# Patient Record
Sex: Female | Born: 1975 | Race: Black or African American | Hispanic: No | Marital: Single | Smoking: Former smoker
Health system: Southern US, Community
[De-identification: ages and names within clinical notes are randomized; demographics above are authoritative.]

## PROBLEM LIST (undated history)

## (undated) DIAGNOSIS — M4628 Osteomyelitis of vertebra, sacral and sacrococcygeal region: Secondary | ICD-10-CM

## (undated) DIAGNOSIS — G8220 Paraplegia, unspecified: Secondary | ICD-10-CM

## (undated) DIAGNOSIS — N390 Urinary tract infection, site not specified: Secondary | ICD-10-CM

## (undated) DIAGNOSIS — J189 Pneumonia, unspecified organism: Secondary | ICD-10-CM

## (undated) DIAGNOSIS — S31809A Unspecified open wound of unspecified buttock, initial encounter: Secondary | ICD-10-CM

## (undated) DIAGNOSIS — G35 Multiple sclerosis: Secondary | ICD-10-CM

## (undated) DIAGNOSIS — I499 Cardiac arrhythmia, unspecified: Secondary | ICD-10-CM

## (undated) DIAGNOSIS — D709 Neutropenia, unspecified: Secondary | ICD-10-CM

## (undated) DIAGNOSIS — R6520 Severe sepsis without septic shock: Secondary | ICD-10-CM

## (undated) DIAGNOSIS — A419 Sepsis, unspecified organism: Secondary | ICD-10-CM

## (undated) DIAGNOSIS — E46 Unspecified protein-calorie malnutrition: Secondary | ICD-10-CM

## (undated) HISTORY — DX: Osteomyelitis of vertebra, sacral and sacrococcygeal region: M46.28

---

## 1999-12-02 ENCOUNTER — Emergency Department (HOSPITAL_COMMUNITY): Admission: EM | Admit: 1999-12-02 | Discharge: 1999-12-02 | Payer: Self-pay | Admitting: Emergency Medicine

## 2000-11-01 ENCOUNTER — Encounter: Payer: Self-pay | Admitting: Emergency Medicine

## 2000-11-01 ENCOUNTER — Emergency Department (HOSPITAL_COMMUNITY): Admission: EM | Admit: 2000-11-01 | Discharge: 2000-11-01 | Payer: Self-pay | Admitting: Emergency Medicine

## 2001-03-09 ENCOUNTER — Ambulatory Visit (HOSPITAL_COMMUNITY): Admission: RE | Admit: 2001-03-09 | Discharge: 2001-03-09 | Payer: Self-pay | Admitting: Ophthalmology

## 2001-03-09 ENCOUNTER — Encounter: Payer: Self-pay | Admitting: Ophthalmology

## 2001-10-01 ENCOUNTER — Emergency Department (HOSPITAL_COMMUNITY): Admission: EM | Admit: 2001-10-01 | Discharge: 2001-10-01 | Payer: Self-pay | Admitting: Emergency Medicine

## 2001-11-06 ENCOUNTER — Other Ambulatory Visit: Admission: RE | Admit: 2001-11-06 | Discharge: 2001-11-06 | Payer: Self-pay | Admitting: *Deleted

## 2002-09-16 ENCOUNTER — Ambulatory Visit (HOSPITAL_COMMUNITY): Admission: RE | Admit: 2002-09-16 | Discharge: 2002-09-16 | Payer: Self-pay | Admitting: Psychiatry

## 2002-09-16 ENCOUNTER — Encounter: Payer: Self-pay | Admitting: Psychiatry

## 2002-10-15 ENCOUNTER — Inpatient Hospital Stay (HOSPITAL_COMMUNITY): Admission: AD | Admit: 2002-10-15 | Discharge: 2002-10-17 | Payer: Self-pay | Admitting: Obstetrics and Gynecology

## 2004-04-13 ENCOUNTER — Emergency Department (HOSPITAL_COMMUNITY): Admission: EM | Admit: 2004-04-13 | Discharge: 2004-04-13 | Payer: Self-pay | Admitting: Emergency Medicine

## 2005-01-13 ENCOUNTER — Emergency Department (HOSPITAL_COMMUNITY): Admission: EM | Admit: 2005-01-13 | Discharge: 2005-01-13 | Payer: Self-pay | Admitting: Emergency Medicine

## 2008-06-24 ENCOUNTER — Emergency Department (HOSPITAL_COMMUNITY): Admission: EM | Admit: 2008-06-24 | Discharge: 2008-06-24 | Payer: Self-pay | Admitting: Emergency Medicine

## 2008-06-25 ENCOUNTER — Emergency Department (HOSPITAL_COMMUNITY): Admission: EM | Admit: 2008-06-25 | Discharge: 2008-06-25 | Payer: Self-pay | Admitting: Emergency Medicine

## 2010-05-12 LAB — DIFFERENTIAL
Basophils Absolute: 0 10*3/uL (ref 0.0–0.1)
Eosinophils Absolute: 0.1 10*3/uL (ref 0.0–0.7)
Eosinophils Relative: 0 % (ref 0–5)
Lymphocytes Relative: 11 % — ABNORMAL LOW (ref 12–46)
Lymphocytes Relative: 16 % (ref 12–46)
Lymphs Abs: 1 10*3/uL (ref 0.7–4.0)
Lymphs Abs: 1 10*3/uL (ref 0.7–4.0)
Monocytes Absolute: 0.2 10*3/uL (ref 0.1–1.0)
Monocytes Relative: 4 % (ref 3–12)
Neutro Abs: 5 10*3/uL (ref 1.7–7.7)
Neutro Abs: 7.2 10*3/uL (ref 1.7–7.7)
Neutrophils Relative %: 83 % — ABNORMAL HIGH (ref 43–77)

## 2010-05-12 LAB — COMPREHENSIVE METABOLIC PANEL
AST: 25 U/L (ref 0–37)
Albumin: 4 g/dL (ref 3.5–5.2)
BUN: 11 mg/dL (ref 6–23)
Calcium: 9.6 mg/dL (ref 8.4–10.5)
Chloride: 106 mEq/L (ref 96–112)
Creatinine, Ser: 0.48 mg/dL (ref 0.4–1.2)
GFR calc Af Amer: >60 mL/min (ref >60)
Total Protein: 6.9 g/dL (ref 6.0–8.3)

## 2010-05-12 LAB — CBC
HCT: 39.8 % (ref 36.0–46.0)
MCV: 98.8 fL (ref 78.0–100.0)
Platelets: 232 10*3/uL (ref 150–400)
Platelets: 240 10*3/uL (ref 150–400)
RBC: 4.3 MIL/uL (ref 3.87–5.11)
RDW: 13 % (ref 11.5–15.5)
WBC: 6.3 10*3/uL (ref 4.0–10.5)
WBC: 8.7 10*3/uL (ref 4.0–10.5)

## 2010-05-12 LAB — GLUCOSE, CAPILLARY: Glucose-Capillary: 86 mg/dL (ref 70–99)

## 2010-05-12 LAB — URINALYSIS, ROUTINE W REFLEX MICROSCOPIC
Bilirubin Urine: NEGATIVE
Glucose, UA: NEGATIVE mg/dL
Hgb urine dipstick: NEGATIVE
Ketones, ur: NEGATIVE mg/dL
Leukocytes, UA: NEGATIVE
Nitrite: NEGATIVE
Protein, ur: NEGATIVE mg/dL
Specific Gravity, Urine: 1.03 (ref 1.005–1.030)
Urobilinogen, UA: 0.2 mg/dL (ref 0.0–1.0)
pH: 5.5 (ref 5.0–8.0)
pH: 6.5 (ref 5.0–8.0)

## 2010-05-12 LAB — STOOL CULTURE

## 2010-05-12 LAB — BASIC METABOLIC PANEL
BUN: 6 mg/dL (ref 6–23)
Creatinine, Ser: 0.56 mg/dL (ref 0.4–1.2)
GFR calc Af Amer: >60 mL/min (ref >60)
GFR calc non Af Amer: >60 mL/min (ref >60)

## 2010-05-12 LAB — URINE MICROSCOPIC-ADD ON

## 2010-05-12 LAB — CLOSTRIDIUM DIFFICILE EIA: C difficile Toxins A+B, EIA: NEGATIVE

## 2010-05-12 LAB — PREGNANCY, URINE: Preg Test, Ur: NEGATIVE

## 2011-05-10 ENCOUNTER — Ambulatory Visit: Payer: Self-pay | Admitting: Physical Therapy

## 2011-05-12 ENCOUNTER — Ambulatory Visit: Payer: Self-pay | Admitting: *Deleted

## 2012-02-14 ENCOUNTER — Ambulatory Visit: Payer: Medicare Other | Attending: Neurology | Admitting: Physical Therapy

## 2012-02-14 DIAGNOSIS — R269 Unspecified abnormalities of gait and mobility: Secondary | ICD-10-CM | POA: Insufficient documentation

## 2012-02-14 DIAGNOSIS — IMO0001 Reserved for inherently not codable concepts without codable children: Secondary | ICD-10-CM | POA: Insufficient documentation

## 2012-02-14 DIAGNOSIS — M2420 Disorder of ligament, unspecified site: Secondary | ICD-10-CM | POA: Insufficient documentation

## 2012-02-14 DIAGNOSIS — G35 Multiple sclerosis: Secondary | ICD-10-CM | POA: Insufficient documentation

## 2012-02-14 DIAGNOSIS — R5381 Other malaise: Secondary | ICD-10-CM | POA: Insufficient documentation

## 2012-02-14 DIAGNOSIS — M629 Disorder of muscle, unspecified: Secondary | ICD-10-CM | POA: Insufficient documentation

## 2012-02-22 ENCOUNTER — Ambulatory Visit: Payer: Medicare Other | Admitting: Physical Therapy

## 2012-02-24 ENCOUNTER — Ambulatory Visit: Payer: Medicare Other | Admitting: Physical Therapy

## 2012-02-29 ENCOUNTER — Ambulatory Visit: Payer: Medicare Other | Admitting: Physical Therapy

## 2012-03-02 ENCOUNTER — Ambulatory Visit: Payer: Medicare Other | Admitting: Physical Therapy

## 2012-03-07 ENCOUNTER — Ambulatory Visit: Payer: Medicare Other | Attending: Neurology | Admitting: Physical Therapy

## 2012-03-07 DIAGNOSIS — M2420 Disorder of ligament, unspecified site: Secondary | ICD-10-CM | POA: Insufficient documentation

## 2012-03-07 DIAGNOSIS — R5381 Other malaise: Secondary | ICD-10-CM | POA: Insufficient documentation

## 2012-03-07 DIAGNOSIS — R269 Unspecified abnormalities of gait and mobility: Secondary | ICD-10-CM | POA: Insufficient documentation

## 2012-03-07 DIAGNOSIS — G35 Multiple sclerosis: Secondary | ICD-10-CM | POA: Insufficient documentation

## 2012-03-07 DIAGNOSIS — M629 Disorder of muscle, unspecified: Secondary | ICD-10-CM | POA: Insufficient documentation

## 2012-03-07 DIAGNOSIS — IMO0001 Reserved for inherently not codable concepts without codable children: Secondary | ICD-10-CM | POA: Insufficient documentation

## 2012-03-09 ENCOUNTER — Ambulatory Visit: Payer: Medicare Other | Admitting: Physical Therapy

## 2012-03-14 ENCOUNTER — Ambulatory Visit: Payer: Medicare Other | Admitting: Physical Therapy

## 2012-03-16 ENCOUNTER — Ambulatory Visit: Payer: Medicare Other | Admitting: Physical Therapy

## 2013-03-07 DIAGNOSIS — G35 Multiple sclerosis: Secondary | ICD-10-CM | POA: Diagnosis not present

## 2013-06-04 DIAGNOSIS — G35 Multiple sclerosis: Secondary | ICD-10-CM | POA: Diagnosis not present

## 2013-06-22 DIAGNOSIS — G35 Multiple sclerosis: Secondary | ICD-10-CM | POA: Diagnosis not present

## 2013-07-16 DIAGNOSIS — Z006 Encounter for examination for normal comparison and control in clinical research program: Secondary | ICD-10-CM | POA: Diagnosis not present

## 2013-07-16 DIAGNOSIS — Z87891 Personal history of nicotine dependence: Secondary | ICD-10-CM | POA: Diagnosis not present

## 2013-07-16 DIAGNOSIS — G35 Multiple sclerosis: Secondary | ICD-10-CM | POA: Diagnosis not present

## 2013-07-16 DIAGNOSIS — Z888 Allergy status to other drugs, medicaments and biological substances status: Secondary | ICD-10-CM | POA: Diagnosis not present

## 2013-07-16 DIAGNOSIS — Z7982 Long term (current) use of aspirin: Secondary | ICD-10-CM | POA: Diagnosis not present

## 2013-07-30 DIAGNOSIS — G35 Multiple sclerosis: Secondary | ICD-10-CM | POA: Diagnosis not present

## 2013-12-12 DIAGNOSIS — G35 Multiple sclerosis: Secondary | ICD-10-CM | POA: Diagnosis not present

## 2013-12-12 DIAGNOSIS — Z79899 Other long term (current) drug therapy: Secondary | ICD-10-CM | POA: Diagnosis not present

## 2014-01-15 DIAGNOSIS — Z87891 Personal history of nicotine dependence: Secondary | ICD-10-CM | POA: Diagnosis not present

## 2014-01-15 DIAGNOSIS — Z888 Allergy status to other drugs, medicaments and biological substances status: Secondary | ICD-10-CM | POA: Diagnosis not present

## 2014-01-15 DIAGNOSIS — Z006 Encounter for examination for normal comparison and control in clinical research program: Secondary | ICD-10-CM | POA: Diagnosis not present

## 2014-01-15 DIAGNOSIS — G35 Multiple sclerosis: Secondary | ICD-10-CM | POA: Diagnosis not present

## 2014-01-29 DIAGNOSIS — Z87891 Personal history of nicotine dependence: Secondary | ICD-10-CM | POA: Diagnosis not present

## 2014-01-29 DIAGNOSIS — Z888 Allergy status to other drugs, medicaments and biological substances status: Secondary | ICD-10-CM | POA: Diagnosis not present

## 2014-01-29 DIAGNOSIS — G35 Multiple sclerosis: Secondary | ICD-10-CM | POA: Diagnosis not present

## 2014-01-29 DIAGNOSIS — Z006 Encounter for examination for normal comparison and control in clinical research program: Secondary | ICD-10-CM | POA: Diagnosis not present

## 2014-05-17 DIAGNOSIS — G35 Multiple sclerosis: Secondary | ICD-10-CM | POA: Diagnosis not present

## 2014-05-17 DIAGNOSIS — Z79899 Other long term (current) drug therapy: Secondary | ICD-10-CM | POA: Diagnosis not present

## 2014-05-17 DIAGNOSIS — Z993 Dependence on wheelchair: Secondary | ICD-10-CM | POA: Diagnosis not present

## 2014-06-06 DIAGNOSIS — G35 Multiple sclerosis: Secondary | ICD-10-CM | POA: Diagnosis not present

## 2014-06-06 DIAGNOSIS — Z5189 Encounter for other specified aftercare: Secondary | ICD-10-CM | POA: Diagnosis not present

## 2014-07-04 DIAGNOSIS — R270 Ataxia, unspecified: Secondary | ICD-10-CM | POA: Diagnosis not present

## 2014-07-04 DIAGNOSIS — G35 Multiple sclerosis: Secondary | ICD-10-CM | POA: Diagnosis not present

## 2014-07-15 DIAGNOSIS — G35 Multiple sclerosis: Secondary | ICD-10-CM | POA: Diagnosis not present

## 2014-07-18 DIAGNOSIS — Z006 Encounter for examination for normal comparison and control in clinical research program: Secondary | ICD-10-CM | POA: Diagnosis not present

## 2014-07-18 DIAGNOSIS — G35 Multiple sclerosis: Secondary | ICD-10-CM | POA: Diagnosis not present

## 2014-08-15 DIAGNOSIS — G35 Multiple sclerosis: Secondary | ICD-10-CM | POA: Diagnosis not present

## 2014-09-25 ENCOUNTER — Encounter (HOSPITAL_COMMUNITY): Payer: Self-pay | Admitting: Emergency Medicine

## 2014-09-25 ENCOUNTER — Emergency Department (HOSPITAL_COMMUNITY): Payer: Medicare Other

## 2014-09-25 ENCOUNTER — Emergency Department (HOSPITAL_COMMUNITY)
Admission: EM | Admit: 2014-09-25 | Discharge: 2014-09-25 | Disposition: A | Discharge status: Home / Self Care | Payer: Medicare Other | Attending: Emergency Medicine | Admitting: Emergency Medicine

## 2014-09-25 DIAGNOSIS — Y9289 Other specified places as the place of occurrence of the external cause: Secondary | ICD-10-CM | POA: Insufficient documentation

## 2014-09-25 DIAGNOSIS — W228XXA Striking against or struck by other objects, initial encounter: Secondary | ICD-10-CM | POA: Insufficient documentation

## 2014-09-25 DIAGNOSIS — S99922A Unspecified injury of left foot, initial encounter: Secondary | ICD-10-CM | POA: Diagnosis present

## 2014-09-25 DIAGNOSIS — Y998 Other external cause status: Secondary | ICD-10-CM | POA: Diagnosis not present

## 2014-09-25 DIAGNOSIS — S92421A Displaced fracture of distal phalanx of right great toe, initial encounter for closed fracture: Secondary | ICD-10-CM | POA: Diagnosis not present

## 2014-09-25 DIAGNOSIS — Z87891 Personal history of nicotine dependence: Secondary | ICD-10-CM | POA: Insufficient documentation

## 2014-09-25 DIAGNOSIS — S92422A Displaced fracture of distal phalanx of left great toe, initial encounter for closed fracture: Secondary | ICD-10-CM

## 2014-09-25 DIAGNOSIS — Y9389 Activity, other specified: Secondary | ICD-10-CM | POA: Insufficient documentation

## 2014-09-25 HISTORY — DX: Multiple sclerosis: G35

## 2014-09-25 NOTE — Discharge Instructions (Signed)
Tylenol and Motrin for pain.  Return here as needed.  Ice and elevate the toe

## 2014-09-25 NOTE — ED Notes (Signed)
Patient has returned from being out of the department; patient placed back on continuous pulse oximetry and blood pressure cuff; visitor at bedside

## 2014-09-25 NOTE — ED Notes (Signed)
Pt undressed, in gown, on continuous pulse oximetry and blood pressure cuff; visitor at bedside

## 2014-09-25 NOTE — ED Notes (Signed)
Patient d/c'd from continuous pulse oximetry and blood pressure cuff; patient getting dressed to be discharged home; visitor at bedside

## 2014-09-25 NOTE — ED Notes (Signed)
Pt leaving department safely in personal power chair. Verbalizes understanding of discharge instructions. NAD. A/O x4.

## 2014-09-25 NOTE — ED Notes (Signed)
Pt to ED for evaluation of left foot swelling and pain after "getting it tangled in my power chair 2 days ago." Pt does not report exact mechanism. Pt has hx of MS. Pt unable to bear weight. There is swelling and pain on palpation, pulses present bilaterally and equal. A/ox 4. Son at bedside.

## 2014-09-25 NOTE — ED Provider Notes (Signed)
CSN: [id number]     Arrival date & time 09/25/14  0815 History   First MD Initiated Contact with Patient 09/25/14 [phone]     Chief Complaint  Patient presents with  . Foot Injury     (Consider location/radiation/quality/duration/timing/severity/associated sxs/prior Treatment) HPI Patient is a 39 yo female with a PMH of MS who presents with left calf and foot swelling. She primarily gets around via scooter, on Monday she got her left leg stuck under her scooter. She has been applying ice and taking ibuprofen as needed with some relief. The swelling has gone down since Monday. She only complains of pain with palpation, she is unable to put weight on it. She denies fever, chills, night sweats, N/V, CP, SOB, palpitations.   Past Medical History  Diagnosis Date  . MS (multiple sclerosis)    History reviewed. No pertinent past surgical history. History reviewed. No pertinent family history. Social History  Substance Use Topics  . Smoking status: Former Smoker -- 1.00 packs/day    Types: Cigarettes    Quit date: 02/01/2010  . Smokeless tobacco: None  . Alcohol Use: No   OB History    No data available     Review of Systems All other systems negative except as documented in the HPI. All pertinent positives and negatives as reviewed in the HPI.   Allergies  Review of patient's allergies indicates not on file.  Home Medications   Prior to Admission medications   Not on File   BP 97/60 mmHg  Pulse 78  Temp(Src) 97.8 F (36.6 C) (Oral)  Resp 20  SpO2 100%  LMP  (LMP Unknown) Physical Exam  Constitutional: She is oriented to person, place, and time. She appears well-developed.  Thin female   HENT:  Head: Normocephalic and atraumatic.  Cardiovascular: Normal rate, regular rhythm and normal heart sounds.  Exam reveals no friction rub.   No murmur heard. Unable to appreciate left dorsalis pedis pulse    Pulmonary/Chest: Effort normal and breath sounds normal. No respiratory  distress.  Abdominal: Soft. She exhibits no distension.  Musculoskeletal: She exhibits edema and tenderness.  Left leg with erythema and swelling up to mid calf.  Tenderness to palpation of calf and foot.  No ROM, able to wiggle toes.   Neurological: She is alert and oriented to person, place, and time.  Skin: There is erythema.  Good cap refill in LLE    Psychiatric: She has a normal mood and affect. Her behavior is normal.  Erythema and increased warmth to left calf and foot.     ED Course  Procedures (including critical care time)  Imaging Review I have personally reviewed and evaluated these images and lab results as part of my medical decision-making.  Impression of Left foot XR: Minimally displaced fracture involving proximal base of first distal phalanx with intra-articular extension.  Patient is advised follow-up with orthopedics.  Told to return here as needed.  Patient agrees the plan and all questions were answered   Dalia Heading, PA-C 09/30/14 [place], DO 10/02/14 1344

## 2014-10-16 DIAGNOSIS — S92422A Displaced fracture of distal phalanx of left great toe, initial encounter for closed fracture: Secondary | ICD-10-CM | POA: Diagnosis not present

## 2014-10-16 DIAGNOSIS — S99922A Unspecified injury of left foot, initial encounter: Secondary | ICD-10-CM | POA: Diagnosis not present

## 2014-12-02 DIAGNOSIS — G35 Multiple sclerosis: Secondary | ICD-10-CM | POA: Diagnosis not present

## 2014-12-02 DIAGNOSIS — R270 Ataxia, unspecified: Secondary | ICD-10-CM | POA: Diagnosis not present

## 2015-01-09 DIAGNOSIS — G35 Multiple sclerosis: Secondary | ICD-10-CM | POA: Diagnosis not present

## 2015-01-09 DIAGNOSIS — Z79899 Other long term (current) drug therapy: Secondary | ICD-10-CM | POA: Diagnosis not present

## 2015-01-23 DIAGNOSIS — G35 Multiple sclerosis: Secondary | ICD-10-CM | POA: Diagnosis not present

## 2015-01-23 DIAGNOSIS — Z79899 Other long term (current) drug therapy: Secondary | ICD-10-CM | POA: Diagnosis not present

## 2015-03-05 DIAGNOSIS — G35 Multiple sclerosis: Secondary | ICD-10-CM | POA: Diagnosis not present

## 2015-07-08 DIAGNOSIS — Z79899 Other long term (current) drug therapy: Secondary | ICD-10-CM | POA: Diagnosis not present

## 2015-07-08 DIAGNOSIS — G35 Multiple sclerosis: Secondary | ICD-10-CM | POA: Diagnosis not present

## 2015-07-22 DIAGNOSIS — Z79899 Other long term (current) drug therapy: Secondary | ICD-10-CM | POA: Diagnosis not present

## 2015-07-22 DIAGNOSIS — G35 Multiple sclerosis: Secondary | ICD-10-CM | POA: Diagnosis not present

## 2016-01-06 DIAGNOSIS — Z79899 Other long term (current) drug therapy: Secondary | ICD-10-CM | POA: Diagnosis not present

## 2016-01-06 DIAGNOSIS — G35 Multiple sclerosis: Secondary | ICD-10-CM | POA: Diagnosis not present

## 2016-01-20 DIAGNOSIS — Z79899 Other long term (current) drug therapy: Secondary | ICD-10-CM | POA: Diagnosis not present

## 2016-01-20 DIAGNOSIS — G35 Multiple sclerosis: Secondary | ICD-10-CM | POA: Diagnosis not present

## 2016-02-02 DIAGNOSIS — N390 Urinary tract infection, site not specified: Secondary | ICD-10-CM

## 2016-02-02 DIAGNOSIS — J189 Pneumonia, unspecified organism: Secondary | ICD-10-CM

## 2016-02-02 HISTORY — DX: Pneumonia, unspecified organism: J18.9

## 2016-02-02 HISTORY — DX: Urinary tract infection, site not specified: N39.0

## 2016-02-23 ENCOUNTER — Ambulatory Visit (INDEPENDENT_AMBULATORY_CARE_PROVIDER_SITE_OTHER): Payer: Medicare Other | Admitting: Family Medicine

## 2016-02-23 DIAGNOSIS — R430 Anosmia: Secondary | ICD-10-CM | POA: Diagnosis not present

## 2016-02-23 NOTE — Assessment & Plan Note (Signed)
Possible to be related to her acute cold vs her MS - advised to f/u if the cold goes away and her smell and taste have not returned. Could consider trial of flonase - if still haven't returned then she should contact her neurologist as it may be a component of her MS.

## 2016-02-23 NOTE — Patient Instructions (Addendum)
  Thank you for coming in,   It is possible that this is related to your cold. Please see if your taste and smell return after the cold has resolved.   You can try zyrtec-D or allegra-D to see if that helps your symptoms.   If the taste and smell don't return after the cold goes away then you should contact your neurologist as it may be a component of your MS.    Please feel free to call with any questions or concerns at any time, at [phone]. --Dr. Raeford Razor    IF you received an x-ray today, you will receive an invoice from [facility]. Please contact [facility] at [phone] with questions or concerns regarding your invoice.   IF you received labwork today, you will receive an invoice from [place]. Please contact [facility] at [phone] with questions or concerns regarding your invoice.   Our billing staff will not be able to assist you with questions regarding bills from these companies.  You will be contacted with the lab results as soon as they are available. The fastest way to get your results is to activate your My Chart account. Instructions are located on the last page of this paperwork. If you have not heard from [place] regarding the results in 2 weeks, please contact this office.

## 2016-02-23 NOTE — Progress Notes (Signed)
     Subjective:    Patient ID: Deanna Baldwin, female    DOB: 22-Dec-1975, 41 y.o.   MRN: [id number]  Chief Complaint  Patient presents with  . Sinusitis    poss flu shot  . Cough    PCP: No primary care provider on file.  HPI  This is a 41 y.o. female who is presenting with cold like symptoms for the past week. She reports she has lost some of her sense of smell and taste. She denies any fever or chills. She denies any sinus drainage or sinus pressure. Not having any sinus pressure or sinus drainage. She thinks the loss of smell started two days ago and then the loss of taste happened later. Denies any allergies.   Review of Systems  ROS: No unexpected weight loss, fever, chills, swelling, instability, muscle pain,  redness, otherwise see HPI    PMH: MS Surgical: none Social: Former smoker, alcohol use  Fhx: none   Prior to Admission medications   Medication Sig Start Date End Date Taking? Authorizing Provider  RiTUXimab (RITUXAN IV) Inject into the vein. Patient gets this at [facility], twice a year. Last injection was June 2016  Pt is followed by Dr Loney Loh. Next injection is due Dec 2016   Yes Historical Provider, MD     No Known Allergies    Objective:   Physical Exam BP 110/70 (BP Location: Right Arm, Patient Position: Sitting, Cuff Size: Small)   Pulse (!) 109   Temp 98.7 F (37.1 C) (Oral)   Resp 16   LMP 01/27/2016   SpO2 96%  Gen: NAD, alert, cooperative with exam, in wheel chair  HEENT: NCAT, clear conjunctiva, oropharynx clear, supple neck, TM clear and intact, no cervical LAD, rhinorrhea  CV: tachycardic, regular rhythm, good S1/S2, no murmur, no edema, capillary refill brisk  Resp: CTABL, no wheezes, non-labored Skin: no rashes, normal turgor  Neuro: no gross deficits.  Psych: alert and oriented      Assessment & Plan:   Anosmia Possible to be related to her acute cold vs her MS - advised to f/u if the cold goes away and her smell and taste have not  returned. Could consider trial of flonase - if still haven't returned then she should contact her neurologist as it may be a component of her MS.

## 2016-03-02 ENCOUNTER — Inpatient Hospital Stay (HOSPITAL_COMMUNITY)
Admission: EM | Admit: 2016-03-02 | Discharge: 2016-03-15 | DRG: 871 | Disposition: A | Discharge status: Skilled Nursing Facility | Payer: Medicare Other | Attending: Internal Medicine | Admitting: Internal Medicine

## 2016-03-02 ENCOUNTER — Emergency Department (HOSPITAL_COMMUNITY): Payer: Medicare Other

## 2016-03-02 ENCOUNTER — Encounter (HOSPITAL_COMMUNITY): Payer: Self-pay | Admitting: *Deleted

## 2016-03-02 DIAGNOSIS — Z681 Body mass index (BMI) 19 or less, adult: Secondary | ICD-10-CM

## 2016-03-02 DIAGNOSIS — A419 Sepsis, unspecified organism: Secondary | ICD-10-CM | POA: Diagnosis present

## 2016-03-02 DIAGNOSIS — J189 Pneumonia, unspecified organism: Secondary | ICD-10-CM | POA: Diagnosis not present

## 2016-03-02 DIAGNOSIS — E46 Unspecified protein-calorie malnutrition: Secondary | ICD-10-CM | POA: Diagnosis present

## 2016-03-02 DIAGNOSIS — S31809A Unspecified open wound of unspecified buttock, initial encounter: Secondary | ICD-10-CM | POA: Diagnosis not present

## 2016-03-02 DIAGNOSIS — R7989 Other specified abnormal findings of blood chemistry: Secondary | ICD-10-CM | POA: Diagnosis not present

## 2016-03-02 DIAGNOSIS — M62562 Muscle wasting and atrophy, not elsewhere classified, left lower leg: Secondary | ICD-10-CM | POA: Diagnosis present

## 2016-03-02 DIAGNOSIS — N390 Urinary tract infection, site not specified: Secondary | ICD-10-CM | POA: Diagnosis not present

## 2016-03-02 DIAGNOSIS — R1313 Dysphagia, pharyngeal phase: Secondary | ICD-10-CM | POA: Diagnosis present

## 2016-03-02 DIAGNOSIS — M609 Myositis, unspecified: Secondary | ICD-10-CM | POA: Diagnosis present

## 2016-03-02 DIAGNOSIS — R509 Fever, unspecified: Secondary | ICD-10-CM | POA: Diagnosis not present

## 2016-03-02 DIAGNOSIS — J690 Pneumonitis due to inhalation of food and vomit: Secondary | ICD-10-CM

## 2016-03-02 DIAGNOSIS — M4628 Osteomyelitis of vertebra, sacral and sacrococcygeal region: Secondary | ICD-10-CM | POA: Diagnosis present

## 2016-03-02 DIAGNOSIS — R945 Abnormal results of liver function studies: Secondary | ICD-10-CM | POA: Diagnosis not present

## 2016-03-02 DIAGNOSIS — R000 Tachycardia, unspecified: Secondary | ICD-10-CM | POA: Diagnosis present

## 2016-03-02 DIAGNOSIS — Z87891 Personal history of nicotine dependence: Secondary | ICD-10-CM

## 2016-03-02 DIAGNOSIS — M60009 Infective myositis, unspecified site: Secondary | ICD-10-CM | POA: Diagnosis present

## 2016-03-02 DIAGNOSIS — G9340 Encephalopathy, unspecified: Secondary | ICD-10-CM | POA: Diagnosis present

## 2016-03-02 DIAGNOSIS — D7589 Other specified diseases of blood and blood-forming organs: Secondary | ICD-10-CM | POA: Diagnosis not present

## 2016-03-02 DIAGNOSIS — Z23 Encounter for immunization: Secondary | ICD-10-CM

## 2016-03-02 DIAGNOSIS — M791 Myalgia: Secondary | ICD-10-CM | POA: Diagnosis not present

## 2016-03-02 DIAGNOSIS — Z515 Encounter for palliative care: Secondary | ICD-10-CM | POA: Diagnosis not present

## 2016-03-02 DIAGNOSIS — Z79899 Other long term (current) drug therapy: Secondary | ICD-10-CM

## 2016-03-02 DIAGNOSIS — R5081 Fever presenting with conditions classified elsewhere: Secondary | ICD-10-CM | POA: Diagnosis present

## 2016-03-02 DIAGNOSIS — M62561 Muscle wasting and atrophy, not elsewhere classified, right lower leg: Secondary | ICD-10-CM | POA: Diagnosis present

## 2016-03-02 DIAGNOSIS — D702 Other drug-induced agranulocytosis: Secondary | ICD-10-CM | POA: Diagnosis present

## 2016-03-02 DIAGNOSIS — E872 Acidosis: Secondary | ICD-10-CM | POA: Diagnosis present

## 2016-03-02 DIAGNOSIS — N319 Neuromuscular dysfunction of bladder, unspecified: Secondary | ICD-10-CM | POA: Diagnosis not present

## 2016-03-02 DIAGNOSIS — E876 Hypokalemia: Secondary | ICD-10-CM | POA: Diagnosis not present

## 2016-03-02 DIAGNOSIS — E43 Unspecified severe protein-calorie malnutrition: Secondary | ICD-10-CM | POA: Diagnosis not present

## 2016-03-02 DIAGNOSIS — E870 Hyperosmolality and hypernatremia: Secondary | ICD-10-CM

## 2016-03-02 DIAGNOSIS — L8943 Pressure ulcer of contiguous site of back, buttock and hip, stage 3: Secondary | ICD-10-CM | POA: Diagnosis present

## 2016-03-02 DIAGNOSIS — L89153 Pressure ulcer of sacral region, stage 3: Secondary | ICD-10-CM | POA: Diagnosis present

## 2016-03-02 DIAGNOSIS — Z7401 Bed confinement status: Secondary | ICD-10-CM

## 2016-03-02 DIAGNOSIS — D709 Neutropenia, unspecified: Secondary | ICD-10-CM | POA: Diagnosis not present

## 2016-03-02 DIAGNOSIS — Z993 Dependence on wheelchair: Secondary | ICD-10-CM

## 2016-03-02 DIAGNOSIS — L89303 Pressure ulcer of unspecified buttock, stage 3: Secondary | ICD-10-CM | POA: Diagnosis not present

## 2016-03-02 DIAGNOSIS — R6520 Severe sepsis without septic shock: Secondary | ICD-10-CM | POA: Diagnosis present

## 2016-03-02 DIAGNOSIS — R64 Cachexia: Secondary | ICD-10-CM | POA: Diagnosis present

## 2016-03-02 DIAGNOSIS — L8993 Pressure ulcer of unspecified site, stage 3: Secondary | ICD-10-CM | POA: Insufficient documentation

## 2016-03-02 DIAGNOSIS — G8220 Paraplegia, unspecified: Secondary | ICD-10-CM | POA: Diagnosis present

## 2016-03-02 DIAGNOSIS — D708 Other neutropenia: Secondary | ICD-10-CM

## 2016-03-02 DIAGNOSIS — G35 Multiple sclerosis: Secondary | ICD-10-CM | POA: Diagnosis present

## 2016-03-02 DIAGNOSIS — K6812 Psoas muscle abscess: Secondary | ICD-10-CM | POA: Diagnosis not present

## 2016-03-02 DIAGNOSIS — R3129 Other microscopic hematuria: Secondary | ICD-10-CM | POA: Diagnosis present

## 2016-03-02 DIAGNOSIS — R4182 Altered mental status, unspecified: Secondary | ICD-10-CM | POA: Diagnosis not present

## 2016-03-02 DIAGNOSIS — M6008 Infective myositis, other site: Secondary | ICD-10-CM | POA: Diagnosis present

## 2016-03-02 DIAGNOSIS — R319 Hematuria, unspecified: Secondary | ICD-10-CM

## 2016-03-02 DIAGNOSIS — R402411 Glasgow coma scale score 13-15, in the field [EMT or ambulance]: Secondary | ICD-10-CM | POA: Diagnosis not present

## 2016-03-02 DIAGNOSIS — L89159 Pressure ulcer of sacral region, unspecified stage: Secondary | ICD-10-CM | POA: Diagnosis not present

## 2016-03-02 DIAGNOSIS — R918 Other nonspecific abnormal finding of lung field: Secondary | ICD-10-CM | POA: Diagnosis not present

## 2016-03-02 DIAGNOSIS — D72819 Decreased white blood cell count, unspecified: Secondary | ICD-10-CM | POA: Diagnosis not present

## 2016-03-02 DIAGNOSIS — M6289 Other specified disorders of muscle: Secondary | ICD-10-CM | POA: Diagnosis not present

## 2016-03-02 DIAGNOSIS — S31000A Unspecified open wound of lower back and pelvis without penetration into retroperitoneum, initial encounter: Secondary | ICD-10-CM | POA: Diagnosis not present

## 2016-03-02 DIAGNOSIS — L89150 Pressure ulcer of sacral region, unstageable: Secondary | ICD-10-CM

## 2016-03-02 DIAGNOSIS — L89009 Pressure ulcer of unspecified elbow, unspecified stage: Secondary | ICD-10-CM | POA: Diagnosis not present

## 2016-03-02 DIAGNOSIS — T451X5A Adverse effect of antineoplastic and immunosuppressive drugs, initial encounter: Secondary | ICD-10-CM | POA: Diagnosis present

## 2016-03-02 DIAGNOSIS — A4151 Sepsis due to Escherichia coli [E. coli]: Principal | ICD-10-CM | POA: Diagnosis present

## 2016-03-02 DIAGNOSIS — R1312 Dysphagia, oropharyngeal phase: Secondary | ICD-10-CM | POA: Diagnosis not present

## 2016-03-02 DIAGNOSIS — L0390 Cellulitis, unspecified: Secondary | ICD-10-CM | POA: Diagnosis present

## 2016-03-02 DIAGNOSIS — M869 Osteomyelitis, unspecified: Secondary | ICD-10-CM

## 2016-03-02 DIAGNOSIS — G114 Hereditary spastic paraplegia: Secondary | ICD-10-CM | POA: Diagnosis not present

## 2016-03-02 DIAGNOSIS — D649 Anemia, unspecified: Secondary | ICD-10-CM | POA: Diagnosis not present

## 2016-03-02 DIAGNOSIS — E860 Dehydration: Secondary | ICD-10-CM | POA: Diagnosis present

## 2016-03-02 DIAGNOSIS — R531 Weakness: Secondary | ICD-10-CM | POA: Diagnosis not present

## 2016-03-02 HISTORY — DX: Unspecified open wound of unspecified buttock, initial encounter: S31.809A

## 2016-03-02 HISTORY — DX: Severe sepsis without septic shock: R65.20

## 2016-03-02 HISTORY — DX: Urinary tract infection, site not specified: N39.0

## 2016-03-02 HISTORY — DX: Pneumonia, unspecified organism: J18.9

## 2016-03-02 HISTORY — DX: Unspecified protein-calorie malnutrition: E46

## 2016-03-02 HISTORY — DX: Sepsis, unspecified organism: A41.9

## 2016-03-02 LAB — COMPREHENSIVE METABOLIC PANEL
ALT: 55 U/L — ABNORMAL HIGH (ref 14–54)
AST: 113 U/L — ABNORMAL HIGH (ref 15–41)
Albumin: 2.2 g/dL — ABNORMAL LOW (ref 3.5–5.0)
Alkaline Phosphatase: 118 U/L (ref 38–126)
Anion gap: 14 (ref 5–15)
BILIRUBIN TOTAL: 1.7 mg/dL — AB (ref 0.3–1.2)
BUN: 12 mg/dL (ref 6–20)
CO2: 30 mmol/L (ref 22–32)
Calcium: 8.5 mg/dL — ABNORMAL LOW (ref 8.9–10.3)
Chloride: 97 mmol/L — ABNORMAL LOW (ref 101–111)
Creatinine, Ser: 0.77 mg/dL (ref 0.44–1.00)
Glucose, Bld: 163 mg/dL — ABNORMAL HIGH (ref 65–99)
POTASSIUM: 3.7 mmol/L (ref 3.5–5.1)
Sodium: 141 mmol/L (ref 135–145)
Total Protein: 6.2 g/dL — ABNORMAL LOW (ref 6.5–8.1)

## 2016-03-02 LAB — TSH: TSH: 0.371 u[IU]/mL (ref 0.350–4.500)

## 2016-03-02 LAB — URINALYSIS, ROUTINE W REFLEX MICROSCOPIC
Bilirubin Urine: NEGATIVE
Glucose, UA: NEGATIVE mg/dL
KETONES UR: 5 mg/dL — AB
Nitrite: NEGATIVE
PH: 5 (ref 5.0–8.0)
Protein, ur: 30 mg/dL — AB
SPECIFIC GRAVITY, URINE: 1.018 (ref 1.005–1.030)

## 2016-03-02 LAB — CBC WITH DIFFERENTIAL/PLATELET
BASOS ABS: 0 10*3/uL (ref 0.0–0.1)
BASOS PCT: 0 %
Eosinophils Absolute: 0 10*3/uL (ref 0.0–0.7)
Eosinophils Relative: 0 %
HCT: 37.8 % (ref 36.0–46.0)
HEMOGLOBIN: 11.7 g/dL — AB (ref 12.0–15.0)
Lymphocytes Relative: 6 %
Lymphs Abs: 0.8 10*3/uL (ref 0.7–4.0)
MCH: 28.5 pg (ref 26.0–34.0)
MCHC: 31 g/dL (ref 30.0–36.0)
MCV: 92 fL (ref 78.0–100.0)
MONOS PCT: 5 %
Monocytes Absolute: 0.6 10*3/uL (ref 0.1–1.0)
NEUTROS PCT: 89 %
Neutro Abs: 11.8 10*3/uL — ABNORMAL HIGH (ref 1.7–7.7)
Platelets: 526 10*3/uL — ABNORMAL HIGH (ref 150–400)
RBC: 4.11 MIL/uL (ref 3.87–5.11)
RDW: 13.6 % (ref 11.5–15.5)
WBC: 13.2 10*3/uL — ABNORMAL HIGH (ref 4.0–10.5)

## 2016-03-02 LAB — PROTIME-INR
INR: 1.28
PROTHROMBIN TIME: 16.1 s — AB (ref 11.4–15.2)

## 2016-03-02 LAB — I-STAT CG4 LACTIC ACID, ED: LACTIC ACID, VENOUS: 3.45 mmol/L — AB (ref 0.5–1.9)

## 2016-03-02 LAB — LACTIC ACID, PLASMA
LACTIC ACID, VENOUS: 1.1 mmol/L (ref 0.5–1.9)
Lactic Acid, Venous: 1.3 mmol/L (ref 0.5–1.9)

## 2016-03-02 LAB — APTT: aPTT: 39 seconds — ABNORMAL HIGH (ref 24–36)

## 2016-03-02 LAB — TROPONIN I: Troponin I: <0.03 ng/mL (ref <0.03)

## 2016-03-02 LAB — PROCALCITONIN: Procalcitonin: 2.25 ng/mL

## 2016-03-02 MED ORDER — ENOXAPARIN SODIUM 40 MG/0.4ML ~~LOC~~ SOLN
40.0000 mg | SUBCUTANEOUS | Status: DC
  Administered 2016-03-03: 40 mg via SUBCUTANEOUS
  Filled 2016-03-02: qty 0.4

## 2016-03-02 MED ORDER — ACETAMINOPHEN 650 MG RE SUPP
650.0000 mg | Freq: Four times a day (QID) | RECTAL | Status: DC | PRN
  Administered 2016-03-07: 650 mg via RECTAL
  Filled 2016-03-02: qty 1

## 2016-03-02 MED ORDER — POLYETHYLENE GLYCOL 3350 17 G PO PACK: 17.0000 g | PACK | Freq: Every day | ORAL | Status: DC | PRN

## 2016-03-02 MED ORDER — SODIUM CHLORIDE 0.9 % IV BOLUS (SEPSIS)
250.0000 mL | Freq: Once | INTRAVENOUS | Status: AC
  Administered 2016-03-02: 250 mL via INTRAVENOUS

## 2016-03-02 MED ORDER — PIPERACILLIN-TAZOBACTAM 3.375 G IVPB
3.3750 g | Freq: Three times a day (TID) | INTRAVENOUS | Status: DC
  Administered 2016-03-03 (×2): 3.375 g via INTRAVENOUS
  Filled 2016-03-02 (×4): qty 50

## 2016-03-02 MED ORDER — HYDROCODONE-ACETAMINOPHEN 5-325 MG PO TABS
1.0000 | ORAL_TABLET | ORAL | Status: DC | PRN
  Administered 2016-03-06 – 2016-03-07 (×3): 2 via ORAL
  Filled 2016-03-02 (×3): qty 2

## 2016-03-02 MED ORDER — ONDANSETRON HCL 4 MG PO TABS: 4.0000 mg | ORAL_TABLET | Freq: Four times a day (QID) | ORAL | Status: DC | PRN

## 2016-03-02 MED ORDER — BISACODYL 5 MG PO TBEC
5.0000 mg | DELAYED_RELEASE_TABLET | Freq: Every day | ORAL | Status: DC | PRN
  Filled 2016-03-02: qty 1

## 2016-03-02 MED ORDER — VANCOMYCIN HCL 500 MG IV SOLR
500.0000 mg | Freq: Two times a day (BID) | INTRAVENOUS | Status: DC
  Administered 2016-03-03 (×2): 500 mg via INTRAVENOUS
  Filled 2016-03-02 (×4): qty 500

## 2016-03-02 MED ORDER — SODIUM CHLORIDE 0.9 % IV BOLUS (SEPSIS)
1000.0000 mL | Freq: Once | INTRAVENOUS | Status: AC
  Administered 2016-03-02: 1000 mL via INTRAVENOUS

## 2016-03-02 MED ORDER — PIPERACILLIN-TAZOBACTAM 3.375 G IVPB 30 MIN
3.3750 g | Freq: Once | INTRAVENOUS | Status: AC
  Administered 2016-03-02: 3.375 g via INTRAVENOUS
  Filled 2016-03-02: qty 50

## 2016-03-02 MED ORDER — VANCOMYCIN HCL IN DEXTROSE 1-5 GM/200ML-% IV SOLN
1000.0000 mg | Freq: Once | INTRAVENOUS | Status: AC
  Administered 2016-03-02: 1000 mg via INTRAVENOUS
  Filled 2016-03-02: qty 200

## 2016-03-02 MED ORDER — ONDANSETRON HCL 4 MG/2ML IJ SOLN: 4.0000 mg | Freq: Four times a day (QID) | INTRAMUSCULAR | Status: DC | PRN

## 2016-03-02 MED ORDER — ACETAMINOPHEN 325 MG PO TABS
650.0000 mg | ORAL_TABLET | Freq: Four times a day (QID) | ORAL | Status: DC | PRN
  Administered 2016-03-08 – 2016-03-14 (×3): 650 mg via ORAL
  Filled 2016-03-02 (×3): qty 2

## 2016-03-02 MED ORDER — SODIUM CHLORIDE 0.9 % IV SOLN
INTRAVENOUS | Status: AC
  Administered 2016-03-02: 22:00:00 via INTRAVENOUS

## 2016-03-02 MED ORDER — SODIUM CHLORIDE 0.9% FLUSH: 3.0000 mL | Freq: Two times a day (BID) | INTRAVENOUS | Status: DC

## 2016-03-02 NOTE — ED Provider Notes (Signed)
[facility] DEPT Provider Note   CSN: [id number] Arrival date & time: 03/02/16  1809     History   Chief Complaint Chief Complaint  Patient presents with  . Fever  . Altered Mental Status    HPI Deanna Baldwin is a 41 y.o. female with history of MS and lower extremity paralysis presenting to the ED today for altered mental status and fever starting last night. Patient and patient's mother states that she has had cold-like symptoms of congestion, productive cough with green-yellow sputum since December. Mother states that patient was seen at urgent care last week and was given symptomatically treatment to go home with. Mother and son state that she has worsened since then. Her son states that she was at her baseline yesterday and started "not acting like herself" starting last night. Mother also states that she was at her baseline 2 days ago when she last saw and spoke with her. Patient lives with son, mother does not live with her. Mother states she is not at her baseline at this time. Patient's son reports her looking and acting weaker especially on her left side recently. Patient's mother and son both reports that patient's son is patient's main caregiver at home. Patient denies any urinary symptoms, dysuria, frequency, urgency, chest pain, abdominal pain, nausea, vomiting.  Patient states that she is seen twice here by her neurologist. EMS staff reports patient reported to have a temperature of 104 upon assessment and given 1g Tylenol before arrival to the ED.  The history is provided by the patient, a parent, a relative and medical records (Relative = Son). No language interpreter was used.    Past Medical History:  Diagnosis Date  . MS (multiple sclerosis) [facility])     Patient Active Problem List   Diagnosis Date Noted  . 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 Severe sepsis ([place]) 03/02/2016  . CAP (community acquired pneumonia) 03/02/2016  . UTI  (urinary tract infection) 03/02/2016  . Abnormal LFTs 03/02/2016  . Buttock wound 03/02/2016  . Protein calorie malnutrition ([place]) 03/02/2016    History reviewed. No pertinent surgical history.  OB History    No data available       Home Medications    Prior to Admission medications   Not on File    Family History Family History  Problem Relation Age of Onset  . Mental illness Sister     Social History Social History  Substance Use Topics  . Smoking status: Former Smoker    Packs/day: 1.00    Types: Cigarettes    Quit date: 02/01/2010  . Smokeless tobacco: Never Used  . Alcohol use No     Allergies   Patient has no known allergies.   Review of Systems Review of Systems  Constitutional: Positive for chills and fever.  Respiratory: Positive for cough and shortness of breath.   Cardiovascular: Negative for chest pain.  Gastrointestinal: Negative for abdominal pain, diarrhea, nausea and vomiting.  Genitourinary: Negative for difficulty urinating and dysuria.  Musculoskeletal: Negative for neck pain and neck stiffness.  Skin: Negative for rash and wound.  Neurological: Positive for weakness.  All other systems reviewed and are negative.    Physical Exam Updated Vital Signs BP 93/63   Pulse 101   Temp 100.7 F (38.2 C) (Rectal)   Resp 20   Ht 5\' 9"  (1.753 m)   Wt 49.9 kg   SpO2 95%   BMI 16.24 kg/m   Physical Exam  Constitutional: She is oriented to person, place, and time. She appears well-developed and well-nourished.  Ill appearing.   HENT:  Head: Normocephalic and atraumatic.  Mouth/Throat: Oropharynx is clear and moist.  Eyes: EOM are normal. Pupils are equal, round, and reactive to light.  Neck: Normal range of motion.  No neck tenderness. Normal range of motion of neck. No nuchal rigidity noted.  Cardiovascular: Normal heart sounds and intact distal pulses.  Tachycardia present.   Tachycardic.  Pulmonary/Chest: Effort normal. No  respiratory distress. She has rales.  On 2 L of oxygen  Abdominal: Soft. There is no tenderness. There is no rebound and no guarding.  Soft and nontender. No rebound or guarding. Negative Murphy sign. No focal tenderness at McBurney's point.  Neurological: She is alert and oriented to person, place, and time. GCS eye subscore is 3. GCS verbal subscore is 5. GCS motor subscore is 6.  Patient alert and oriented x 3.   Cranial Nerves:  III,IV, VI: ptosis not present, extra-ocular movements intact bilaterally, direct and consensual pupillary light reflexes intact bilaterally V: facial sensation, jaw opening, and bite strength equal bilaterally VII: eyebrow raise, eyelid close, smile, frown, pucker equal bilaterally VIII: hearing grossly normal bilaterally  IX,X: palate elevation and swallowing intact XI: bilateral shoulder shrug and lateral head rotation equal and strong XII: midline tongue extension  Cerebellar tests negative.  Sensory intact.  Muscle strength 5/5 on upper extremities bilaterally. Though left weaker than left Lower extremities without sensation or strength. Hx of lower extremity paralysis.    Skin: Skin is warm.  Patient does appear to have a decubitus sacral pressure ulcer down through subcutaneous tissue. Area has mild surrounding erythema. About 12 inches in diameter. Picture included in note.    Nursing note and vitals reviewed.    ED Treatments / Results  Labs (all labs ordered are listed, but only abnormal results are displayed) Labs Reviewed  COMPREHENSIVE METABOLIC PANEL - Abnormal; Notable for the following:       Result Value   Chloride 97 (*)    Glucose, Bld 163 (*)    Calcium 8.5 (*)    Total Protein 6.2 (*)    Albumin 2.2 (*)    AST 113 (*)    ALT 55 (*)    Total Bilirubin 1.7 (*)    All other components within normal limits  CBC WITH DIFFERENTIAL/PLATELET - Abnormal; Notable for the following:    WBC 13.2 (*)    Hemoglobin 11.7 (*)    Platelets  526 (*)    Neutro Abs 11.8 (*)    All other components within normal limits  PROTIME-INR - Abnormal; Notable for the following:    Prothrombin Time 16.1 (*)    All other components within normal limits  URINALYSIS, ROUTINE W REFLEX MICROSCOPIC - Abnormal; Notable for the following:    Color, Urine AMBER (*)    APPearance CLOUDY (*)    Hgb urine dipstick MODERATE (*)    Ketones, ur 5 (*)    Protein, ur 30 (*)    Leukocytes, UA MODERATE (*)    Bacteria, UA MANY (*)    Squamous Epithelial / LPF 0-5 (*)    All other components within normal limits  I-STAT CG4 LACTIC ACID, ED - Abnormal; Notable for the following:    Lactic Acid, Venous 3.45 (*)    All other components within normal limits  CULTURE, BLOOD (ROUTINE X 2)  CULTURE, BLOOD (ROUTINE X 2)  URINE CULTURE  CULTURE, EXPECTORATED  SPUTUM-ASSESSMENT  LACTIC ACID, PLASMA  CBC WITH DIFFERENTIAL/PLATELET  LACTIC ACID, PLASMA  PROCALCITONIN  APTT  COMPREHENSIVE METABOLIC PANEL  TROPONIN I  TROPONIN I  TROPONIN I  TSH    EKG  EKG Interpretation None       Radiology Dg Chest Port 1 View  Result Date: 03/02/2016 CLINICAL DATA:  Fever. EXAM: PORTABLE CHEST 1 VIEW COMPARISON:  None. FINDINGS: The heart size and mediastinal contours are within normal limits. No pneumothorax is noted. No significant pleural effusion is noted. Patchy alveolar opacities are noted bilaterally concerning for pneumonia. The visualized skeletal structures are unremarkable. IMPRESSION: Bilateral patchy alveolar opacities are noted concerning for pneumonia. Electronically Signed   By: Marijo Conception, M.D.   On: 03/02/2016 20:05    Procedures Procedures (including critical care time)      Medications Ordered in ED Medications  piperacillin-tazobactam (ZOSYN) IVPB 3.375 g (not administered)  vancomycin (VANCOCIN) 500 mg in sodium chloride 0.9 % 100 mL IVPB (not administered)  enoxaparin (LOVENOX) injection 40 mg (not administered)  sodium  chloride flush (NS) 0.9 % injection 3 mL (not administered)  0.9 %  sodium chloride infusion (not administered)  acetaminophen (TYLENOL) tablet 650 mg (not administered)    Or  acetaminophen (TYLENOL) suppository 650 mg (not administered)  HYDROcodone-acetaminophen (NORCO/VICODIN) 5-325 MG per tablet 1-2 tablet (not administered)  polyethylene glycol (MIRALAX / GLYCOLAX) packet 17 g (not administered)  bisacodyl (DULCOLAX) EC tablet 5 mg (not administered)  ondansetron (ZOFRAN) tablet 4 mg (not administered)    Or  ondansetron (ZOFRAN) injection 4 mg (not administered)  sodium chloride 0.9 % bolus 250 mL (not administered)  sodium chloride 0.9 % bolus 1,000 mL (0 mLs Intravenous Stopped 03/02/16 1952)    And  sodium chloride 0.9 % bolus 250 mL (0 mLs Intravenous Stopped 03/02/16 2009)  piperacillin-tazobactam (ZOSYN) IVPB 3.375 g (0 g Intravenous Stopped 03/02/16 1952)  vancomycin (VANCOCIN) IVPB 1000 mg/200 mL premix (0 mg Intravenous Stopped 03/02/16 2052)     Initial Impression / Assessment and Plan / ED Course  I have reviewed the triage vital signs and the nursing notes.  Pertinent labs & imaging results that were available during my care of the patient were reviewed by me and considered in my medical decision making (see chart for details).     Patient entered febrile, tachycardic, ill appearing, altered mental status and started on sepsis protocol on arrival. Patient was given 1 g of Tylenol prior to arrival. She was noted to have a temperature of 104 at EMS arrival. Patient hypotensive here in the ED at 89/60, respirations are 28, pulse at 138, and oxygen saturation at 86%. Patient A & O x 3. GCS 14 at my initial assessment. Patient does appear ill and First lactic acid showed 3.45.  Patient immediately started on fluids 30/kg and broad-spectrum antibiotics per protocol. Chest x-ray showed evidence of pneumonia. Patient also had evidence of a sacral decubitus ulcer which may also be  contributing to her current symptoms. Patient appears to be responding to initial treatment.   I spoke with hospitalist, Dr. Myna Hidalgo, who agreed to admit patient for further evaluation and treatment.    Final Clinical Impressions(s) / ED Diagnoses   Final diagnoses:  Severe sepsis ([place])  Community acquired pneumonia, unspecified laterality    New Prescriptions New Prescriptions   No medications on file     [place], [place] 03/02/16 2327

## 2016-03-02 NOTE — ED Notes (Signed)
MD at bedside. 

## 2016-03-02 NOTE — ED Notes (Signed)
Activated code sepsis  

## 2016-03-02 NOTE — Progress Notes (Signed)
Pharmacy Antibiotic Note  Deanna Baldwin is a 41 y.o. female admitted on 03/02/2016 with sepsis. Pharmacy has been consulted for vancomycin and zosyn dosing. Renal function wnl.  Vancomycin trough goal 15-20  Plan: 1) Vancomycin 1g IV x 1 then 500mg  IV q12 2) Zosyn 3.375g IV q8 (EI) 3) Follow renal function, cultures, LOT, level if needed  Height: 5\' 9"  (175.3 cm) Weight: 110 lb (49.9 kg) IBW/kg (Calculated) : 66.2  Temp (24hrs), Avg:100.7 F (38.2 C), Min:100.7 F (38.2 C), Max:100.7 F (38.2 C)   Recent Labs Lab 03/02/16 1832 03/02/16 1843  WBC 13.2*  --   CREATININE 0.77  --   LATICACIDVEN  --  3.45*    Estimated Creatinine Clearance: 74.4 mL/min (by C-G formula based on SCr of 0.77 mg/dL).    No Known Allergies  Antimicrobials this admission: 1/30 Vancomycin >> 1/30 Zosyn >>  Dose adjustments this admission: n/a  Microbiology results: 1/30 blood >> 1/30 urine >>  Thank you for allowing pharmacy to be a part of this patient's care.  Deboraha Sprang 03/02/2016 8:40 PM

## 2016-03-02 NOTE — H&P (Signed)
History and Physical    Deanna Baldwin [id number] DOB: Jul 14, 1975 DOA: 03/02/2016  PCP: No primary care provider on file.   Patient coming from: Home  Chief Complaint: AMS, fever   HPI: Deanna Baldwin is a 41 y.o. 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 Patient was reportedly suffering from upper respiratory illness for the last 2-3 weeks, felt as though this had almost resolved, but then noted today re-worsening several days ago. Family had seen her on 02/29/2016 without confusion, but with febrile respiratory illness. Patient didn't answer her phone today, raising concern from her family. She was found at home confused and lethargic with temperature reportedly 104 F. She was brought in to the ED for evaluation of this. Aside from Rituxan infusions, the patient takes no medications and denies alcohol use or illicit substances. There has been no recent fall or head trauma.   ED Course: Upon arrival to the ED, patient is found to be febrile to 38.2 C, saturating 86% on room air, tachypneic, tachycardic in the 130s, and with blood pressure 89/60. EKG features sinus tachycardia with rate 139, left axis deviation, and a diffuse repolarization abnormality. Chest x-ray is notable for patchy bilateral airspace opacities concerning for pneumonia. Chemistry panel is notable for mild elevation in transaminases and total bilirubin which had been normal last month. BC features a leukocytosis to 13,200 and thrombocytosis to 526,000. Urinalysis is consistent with infection and there is microscopic hematuria. Lactic acid is elevated to a value of 3.45. Blood and urine cultures were obtained and the patient was treated with 30 cc/kg bolus of normal saline. She was started on empiric vancomycin and Zosyn. Tachycardia has improved, mental status has almost returned to  baseline, and blood pressure remains soft, but stable. Patient will be admitted to the telemetry unit for ongoing evaluation and management of severe sepsis suspected secondary to community-acquired pneumonia, and possibly UTI.  Review of Systems:  All other systems reviewed and apart from HPI, are negative.  Past Medical History:  Diagnosis Date  . MS (multiple sclerosis) ([place])     History reviewed. No pertinent surgical history.   reports that she quit smoking about 6 years ago. Her smoking use included Cigarettes. She smoked 1.00 pack per day. She has never used smokeless tobacco. She reports that she does not drink alcohol or use drugs.  No Known Allergies  Family History  Problem Relation Age of Onset  . Mental illness Sister      Prior to Admission medications   Not on File    Physical Exam: Vitals:   03/02/16 2115 03/02/16 2130 03/02/16 2145 03/02/16 2200  BP: 93/59 90/60 90/62  93/63  Pulse: 101 102 102 101  Resp: (!) 29  20   Temp:      TempSrc:      SpO2: 97% 97% 97% 95%  Weight:      Height:              Constitutional: No acute distress, calm, comfortable. Very thin.  Eyes: PERTLA, lids and conjunctivae normal ENMT: Mucous membranes are dry, fissuring. Posterior pharynx clear of any exudate or lesions.   Neck: normal, supple, no masses, no thyromegaly Respiratory: Coarse rhonchi throughout bilateral lung fields. Normal respiratory effort. No accessory muscle use.  Cardiovascular: Rate ~110 and regular. No extremity edema. No significant JVD. Abdomen: No distension, no tenderness, no masses palpated. Bowel  sounds normal.  Musculoskeletal: no clubbing / cyanosis. Contractures at bilateral LE's. Diffuse muscle atrophy.  Skin: no significant rashes, lesions, ulcers. Warm, dry, well-perfused. Poor turgor. Buttock wounds PTA, see photo.  Neurologic: No gross facial asymmetry, PERRL, horizontal nystagmus. Spastic paraplegia.  Psychiatric: Normal judgment and  insight. Alert and oriented x 3. Normal mood and affect.     Labs on Admission: I have personally reviewed following labs and imaging studies  CBC:  Recent Labs Lab 03/02/16 1832  WBC 13.2*  NEUTROABS 11.8*  HGB 11.7*  HCT 37.8  MCV 92.0  PLT 0000000*   Basic Metabolic Panel:  Recent Labs Lab 03/02/16 1832  NA 141  K 3.7  CL 97*  CO2 30  GLUCOSE 163*  BUN 12  CREATININE 0.77  CALCIUM 8.5*   GFR: Estimated Creatinine Clearance: 74.4 mL/min (by C-G formula based on SCr of 0.77 mg/dL). Liver Function Tests:  Recent Labs Lab 03/02/16 1832  AST 113*  ALT 55*  ALKPHOS 118  BILITOT 1.7*  PROT 6.2*  ALBUMIN 2.2*   No results for input(s): LIPASE, AMYLASE in the last 168 hours. No results for input(s): AMMONIA in the last 168 hours. Coagulation Profile:  Recent Labs Lab 03/02/16 1832  INR 1.28   Cardiac Enzymes: No results for input(s): CKTOTAL, CKMB, CKMBINDEX, TROPONINI in the last 168 hours. BNP (last 3 results) No results for input(s): PROBNP in the last 8760 hours. HbA1C: No results for input(s): HGBA1C in the last 72 hours. CBG: No results for input(s): GLUCAP in the last 168 hours. Lipid Profile: No results for input(s): CHOL, HDL, LDLCALC, TRIG, CHOLHDL, LDLDIRECT in the last 72 hours. Thyroid Function Tests: No results for input(s): TSH, T4TOTAL, FREET4, T3FREE, THYROIDAB in the last 72 hours. Anemia Panel: No results for input(s): VITAMINB12, FOLATE, FERRITIN, TIBC, IRON, RETICCTPCT in the last 72 hours. Urine analysis:    Component Value Date/Time   COLORURINE AMBER (A) 03/02/2016 1929   APPEARANCEUR CLOUDY (A) 03/02/2016 1929   LABSPEC 1.018 03/02/2016 1929   PHURINE 5.0 03/02/2016 1929   GLUCOSEU NEGATIVE 03/02/2016 1929   HGBUR MODERATE (A) 03/02/2016 1929   BILIRUBINUR NEGATIVE 03/02/2016 1929   KETONESUR 5 (A) 03/02/2016 1929   PROTEINUR 30 (A) 03/02/2016 1929   UROBILINOGEN 0.2 06/25/2008 1551   NITRITE NEGATIVE 03/02/2016 1929    LEUKOCYTESUR MODERATE (A) 03/02/2016 1929   Sepsis Labs: @LABRCNTIP (procalcitonin:4,lacticidven:4) )No results found for this or any previous visit (from the past 240 hour(s)).   Radiological Exams on Admission: Dg Chest Port 1 View  Result Date: 03/02/2016 CLINICAL DATA:  Fever. EXAM: PORTABLE CHEST 1 VIEW COMPARISON:  None. FINDINGS: The heart size and mediastinal contours are within normal limits. No pneumothorax is noted. No significant pleural effusion is noted. Patchy alveolar opacities are noted bilaterally concerning for pneumonia. The visualized skeletal structures are unremarkable. IMPRESSION: Bilateral patchy alveolar opacities are noted concerning for pneumonia. Electronically Signed   By: Marijo Conception, M.D.   On: 03/02/2016 20:05    EKG: Independently reviewed. Sinus tachycardia (rate 139), LAD, diffuse repolarization abnormality.   Assessment/Plan  1. Severe sepsis with PNA, UTI - Meets sepsis criteria on admission and has acute encephalopathy  - Blood and urine cultures obtained in ED, 30 cc/kg NS bolus given, and she was started on empiric vancomycin and Zosyn - Lactate 3.45 initially, normalized with the fluid bolus - CXR consistent with bilateral PNA, UA consistent with infection, buttock wounds possible source  - Given her immunocompromised state and critical  illness, plan to continue empiric broad-spectrums for now while awaiting culture data and following clinical response to therapy    2. Elevated LFT's  - Mild elevation in transaminases and bilirubin noted; these were previously wnl  - There is no abd pain or tenderness; suspect this is secondary to sepsis, less likely   - Will repeat chem panel in am, consider imaging if worsening    3. Multiple sclerosis with spastic paraplegia  - Followed by neurologist at [facility] and managed with Rituxan infusions  - No evidence for flare on admission - Spastic paraplegia, non-ambulatory, has declined muscle relaxants in past  as she is not bothered by spasms   4. Protein-calorie malnutrition - BMI 16.3, serum albumin 2.2, bitemporal wasting noted  - Dietary consultation requested    5. Buttock wounds, present on arrival  - See photos  - Wound care consultation requested for recommendations   DVT prophylaxis: sq Lovenox  Code Status: Full  Family Communication: Discussed with patient Disposition Plan: Admit to telemetry Consults called: None Admission status: Inpatient    Vianne Bulls, MD [facility] Pager [phone]  If 7PM-7AM, please contact night-coverage [email or web address] Password [email or web address]  03/02/2016, 10:12 PM

## 2016-03-02 NOTE — ED Provider Notes (Signed)
Medical screening examination/treatment/procedure(s) were conducted as a shared visit with non-physician practitioner(s) and myself.  I personally evaluated the patient during the encounter. Briefly, the patient is a 41 y.o. female with a history of multiple sclerosis presents to the ED with fever and cough; noted to be hypotensive and tachycardic. Because sepsis initiated. Patient provided with 30cc/kilogram of IV fluids and started on empiric antibiotics. Workup consistent with pneumonia. Patient also noted to have sacral decubitus ulcer. Admitted to hospitalist for further management.   EKG Interpretation  Date/Time:  Tuesday March 02 2016 18:34:56 EST Ventricular Rate:  139 PR Interval:    QRS Duration: 99 QT Interval:  243 QTC Calculation: 370 R Axis:   -38 Text Interpretation:  Sinus tachycardia Ventricular premature complex Aberrant conduction of SV complex(es) Left axis deviation Baseline wander in lead(s) I II III aVL aVF V1 V3 V4 Confirmed by [facility] MD, Bethene Hankinson ([id number]) on 03/03/2016 12:42:27 AM        CRITICAL CARE Performed by: Grayce Sessions Salomon Ganser Total critical care time: 30 minutes Critical care time was exclusive of separately billable procedures and treating other patients. Critical care was necessary to treat or prevent imminent or life-threatening deterioration. Critical care was time spent personally by me on the following activities: development of treatment plan with patient and/or surrogate as well as nursing, discussions with consultants, evaluation of patient's response to treatment, examination of patient, obtaining history from patient or surrogate, ordering and performing treatments and interventions, ordering and review of laboratory studies, ordering and review of radiographic studies, pulse oximetry and re-evaluation of patient's condition.     Fatima Blank, MD 03/03/16 [id number]

## 2016-03-02 NOTE — ED Notes (Signed)
Patient placed on 2L O2 Brady d/t SpO2 being in the high 80s on RA. MD aware.

## 2016-03-02 NOTE — ED Triage Notes (Signed)
INfo provided by EMS staff. PT reported to have a temp of 104  Upon assessment of EMS staff. PT received 1000mg  tylenol before arrival to ED. PT Mother reported PT has had cold since DEC. Mother reports she last saw PT on Sunday . Pt did not answer telephone today ,so Mother went to home. Pt was brought to ED for eval.

## 2016-03-03 ENCOUNTER — Encounter (HOSPITAL_COMMUNITY): Payer: Self-pay | Admitting: General Practice

## 2016-03-03 DIAGNOSIS — A419 Sepsis, unspecified organism: Secondary | ICD-10-CM

## 2016-03-03 DIAGNOSIS — R6520 Severe sepsis without septic shock: Secondary | ICD-10-CM

## 2016-03-03 DIAGNOSIS — L8993 Pressure ulcer of unspecified site, stage 3: Secondary | ICD-10-CM | POA: Insufficient documentation

## 2016-03-03 DIAGNOSIS — S31809A Unspecified open wound of unspecified buttock, initial encounter: Secondary | ICD-10-CM

## 2016-03-03 DIAGNOSIS — L89303 Pressure ulcer of unspecified buttock, stage 3: Secondary | ICD-10-CM

## 2016-03-03 HISTORY — DX: Sepsis, unspecified organism: R65.20

## 2016-03-03 HISTORY — DX: Unspecified open wound of unspecified buttock, initial encounter: S31.809A

## 2016-03-03 HISTORY — DX: Sepsis, unspecified organism: A41.9

## 2016-03-03 LAB — COMPREHENSIVE METABOLIC PANEL
ALBUMIN: 1.7 g/dL — AB (ref 3.5–5.0)
ALK PHOS: 90 U/L (ref 38–126)
ALT: 42 U/L (ref 14–54)
AST: 84 U/L — ABNORMAL HIGH (ref 15–41)
Anion gap: 11 (ref 5–15)
BUN: 9 mg/dL (ref 6–20)
CALCIUM: 7.9 mg/dL — AB (ref 8.9–10.3)
CHLORIDE: 104 mmol/L (ref 101–111)
CO2: 31 mmol/L (ref 22–32)
CREATININE: 0.49 mg/dL (ref 0.44–1.00)
GFR calc Af Amer: >60 mL/min (ref >60)
GFR calc non Af Amer: >60 mL/min (ref >60)
GLUCOSE: 135 mg/dL — AB (ref 65–99)
Potassium: 3 mmol/L — ABNORMAL LOW (ref 3.5–5.1)
SODIUM: 146 mmol/L — AB (ref 135–145)
Total Bilirubin: 0.5 mg/dL (ref 0.3–1.2)
Total Protein: 5.2 g/dL — ABNORMAL LOW (ref 6.5–8.1)

## 2016-03-03 LAB — CBC WITH DIFFERENTIAL/PLATELET
BASOS PCT: 0 %
Basophils Absolute: 0 10*3/uL (ref 0.0–0.1)
EOS ABS: 0 10*3/uL (ref 0.0–0.7)
Eosinophils Relative: 0 %
HCT: 32 % — ABNORMAL LOW (ref 36.0–46.0)
HEMOGLOBIN: 9.6 g/dL — AB (ref 12.0–15.0)
Lymphocytes Relative: 8 %
Lymphs Abs: 1.2 10*3/uL (ref 0.7–4.0)
MCH: 27.5 pg (ref 26.0–34.0)
MCHC: 30 g/dL (ref 30.0–36.0)
MCV: 91.7 fL (ref 78.0–100.0)
Monocytes Absolute: 0.5 10*3/uL (ref 0.1–1.0)
Monocytes Relative: 4 %
Neutro Abs: 12.6 10*3/uL — ABNORMAL HIGH (ref 1.7–7.7)
Neutrophils Relative %: 88 %
PLATELETS: 419 10*3/uL — AB (ref 150–400)
RBC: 3.49 MIL/uL — AB (ref 3.87–5.11)
RDW: 13.4 % (ref 11.5–15.5)
WBC: 14.2 10*3/uL — AB (ref 4.0–10.5)

## 2016-03-03 LAB — GLUCOSE, CAPILLARY: Glucose-Capillary: 104 mg/dL — ABNORMAL HIGH (ref 65–99)

## 2016-03-03 LAB — INFLUENZA PANEL BY PCR (TYPE A & B)
Influenza A By PCR: NEGATIVE
Influenza B By PCR: NEGATIVE

## 2016-03-03 MED ORDER — ENSURE ENLIVE PO LIQD
237.0000 mL | Freq: Two times a day (BID) | ORAL | Status: DC
  Administered 2016-03-03 – 2016-03-15 (×15): 237 mL via ORAL

## 2016-03-03 MED ORDER — SODIUM CHLORIDE 0.9 % IV SOLN
INTRAVENOUS | Status: DC
  Administered 2016-03-03 – 2016-03-04 (×4): via INTRAVENOUS

## 2016-03-03 MED ORDER — INFLUENZA VAC SPLIT QUAD 0.5 ML IM SUSY
0.5000 mL | PREFILLED_SYRINGE | INTRAMUSCULAR | Status: AC
  Administered 2016-03-06: 0.5 mL via INTRAMUSCULAR
  Filled 2016-03-03: qty 0.5

## 2016-03-03 MED ORDER — ENOXAPARIN SODIUM 30 MG/0.3ML ~~LOC~~ SOLN
20.0000 mg | SUBCUTANEOUS | Status: DC
  Administered 2016-03-04 – 2016-03-10 (×7): 20 mg via SUBCUTANEOUS
  Filled 2016-03-03 (×7): qty 0.3

## 2016-03-03 MED ORDER — PIPERACILLIN-TAZOBACTAM 3.375 G IVPB
3.3750 g | Freq: Three times a day (TID) | INTRAVENOUS | Status: DC
  Administered 2016-03-04 – 2016-03-07 (×11): 3.375 g via INTRAVENOUS
  Filled 2016-03-03 (×12): qty 50

## 2016-03-03 MED ORDER — COLLAGENASE 250 UNIT/GM EX OINT
TOPICAL_OINTMENT | Freq: Every day | CUTANEOUS | Status: DC
  Administered 2016-03-03 – 2016-03-09 (×7): via TOPICAL
  Filled 2016-03-03 (×2): qty 30

## 2016-03-03 NOTE — Progress Notes (Signed)
PROGRESS NOTE    Deanna Baldwin  [id number] DOB: 07/08/1975 DOA: 03/02/2016 PCP: [name], MD    Brief Narrative:  41 yo female, with multiple sclerosis presents with altered mentation and fever. Recent respiratory illness. Found by patient's family lethargic with temperature 104. On the initial evaluation found to be septic. Positive urine infection and bibasilar pulmonary infiltrates. Responding well to antibiotic therapy.   Assessment & Plan:   Principal Problem:   Severe sepsis ([place]) Active Problems:   Spastic 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 ([place])   CAP (community acquired pneumonia)   UTI (urinary tract infection)   Abnormal LFTs   Buttock wound   Protein calorie malnutrition ([facility])   Pressure injury of skin   1. Sepsis due to urine infection, present on admission. Will continue broad spectrum antibiotic therapy with vancomycin and zosyn, follow on cultures, cell count and temperature curve. Blood pressure low [id number] systolic, will resume gentle hydration with isotonic saline, target MAP 65 or greater.    2. Pneumonia bilateral, possible aspiration, present on admission. Will continue vancomycin and zosyn for now, aspiration precautions, swallow evaluation. Will continue oxymetry monitoring and supplemental 02 per [place] to target 02 sat above 92%.  3. Pressure ulcers, sacral, present on admission, stage 2 to 3, multiple. Will continue wound care and supportive care. Patient is not ambulatory.   4. Multiple sclerosis. Will continue conservative care, no signs of acute worsening.   5. Protein calorie malnutrition. BMI 16. Will follow nutrition recommendations.    DVT prophylaxis: enoxaparin Code Status: full  Family Communication: No family at the bedside  Disposition Plan: home   Consultants:   Wound care  Procedures:    Antimicrobials:   Vancomycin  Zosyn     Subjective: Patient feeling better, no nausea or vomiting. At home bed and wheelchair  bound.   Objective: Vitals:   03/02/16 2353 03/03/16 0030 03/03/16 0452 03/03/16 0601  BP: (!) 85/51 (!) 90/54  93/69  Pulse: 97 95  100  Resp: 16   18  Temp: 98.1 F (36.7 C)   98.1 F (36.7 C)  TempSrc:    Oral  SpO2: 100%   95%  Weight:   41.7 kg (91 lb 14.4 oz)   Height:        Intake/Output Summary (Last 24 hours) at 03/03/16 1529 Last data filed at 03/03/16 1111  Gross per 24 hour  Intake          2842.83 ml  Output               50 ml  Net          2792.83 ml   Filed Weights   03/02/16 1915 03/03/16 0452  Weight: 49.9 kg (110 lb) 41.7 kg (91 lb 14.4 oz)    Examination:  General exam: deconditioned. E ENT: mild pallor but no icterus, oral mucosa moist Respiratory system: Clear to auscultation. Respiratory effort normal. Cardiovascular system: S1 & S2 heard, RRR. No JVD, murmurs, rubs, gallops or clicks. No pedal edema. Gastrointestinal system: Abdomen is nondistended, soft and nontender. No organomegaly or masses felt. Normal bowel sounds heard. Central nervous system: Alert and oriented. No focal neurological deficits. Extremities: Symmetric 5 x 5 power. Skin: No rashes, lesions or ulcers  Data Reviewed: I have personally reviewed following labs and imaging studies  CBC:  Recent Labs Lab 03/02/16 1832 03/03/16 0505  WBC 13.2* 14.2*  NEUTROABS 11.8* 12.6*  HGB 11.7* 9.6*  HCT 37.8 32.0*  MCV 92.0 91.7  PLT 526* 123XX123*   Basic Metabolic Panel:  Recent Labs Lab 03/02/16 1832 03/03/16 0505  NA 141 146*  K 3.7 3.0*  CL 97* 104  CO2 30 31  GLUCOSE 163* 135*  BUN 12 9  CREATININE 0.77 0.49  CALCIUM 8.5* 7.9*   GFR: Estimated Creatinine Clearance: 62.2 mL/min (by C-G formula based on SCr of 0.49 mg/dL). Liver Function Tests:  Recent Labs Lab 03/02/16 1832 03/03/16 0505  AST 113* 84*  ALT 55* 42  ALKPHOS 118 90  BILITOT 1.7* 0.5  PROT 6.2* 5.2*  ALBUMIN 2.2* 1.7*   No results for input(s): LIPASE, AMYLASE in the last 168 hours. No  results for input(s): AMMONIA in the last 168 hours. Coagulation Profile:  Recent Labs Lab 03/02/16 1832  INR 1.28   Cardiac Enzymes:  Recent Labs Lab 03/02/16 2300  TROPONINI <0.03   BNP (last 3 results) No results for input(s): PROBNP in the last 8760 hours. HbA1C: No results for input(s): HGBA1C in the last 72 hours. CBG:  Recent Labs Lab 03/03/16 0806  GLUCAP 104*   Lipid Profile: No results for input(s): CHOL, HDL, LDLCALC, TRIG, CHOLHDL, LDLDIRECT in the last 72 hours. Thyroid Function Tests:  Recent Labs  03/02/16 2020  TSH 0.371   Anemia Panel: No results for input(s): VITAMINB12, FOLATE, FERRITIN, TIBC, IRON, RETICCTPCT in the last 72 hours. Sepsis Labs:  Recent Labs Lab 03/02/16 1843 03/02/16 2024 03/02/16 2252  PROCALCITON  --   --  2.25  LATICACIDVEN 3.45* 1.3 1.1    Recent Results (from the past 240 hour(s))  Culture, blood (Routine x 2)     Status: None (Preliminary result)   Collection Time: 03/02/16  6:32 PM  Result Value Ref Range Status   Specimen Description BLOOD RIGHT FOREARM  Final   Special Requests BOTTLES DRAWN AEROBIC AND ANAEROBIC 5CC  Final   Culture NO GROWTH < 24 HOURS  Final   Report Status PENDING  Incomplete  Culture, blood (Routine x 2)     Status: None (Preliminary result)   Collection Time: 03/02/16  6:39 PM  Result Value Ref Range Status   Specimen Description BLOOD RIGHT HAND  Final   Special Requests BOTTLES DRAWN AEROBIC AND ANAEROBIC 5CC  Final   Culture NO GROWTH < 24 HOURS  Final   Report Status PENDING  Incomplete         Radiology Studies: Dg Chest Port 1 View  Result Date: 03/02/2016 CLINICAL DATA:  Fever. EXAM: PORTABLE CHEST 1 VIEW COMPARISON:  None. FINDINGS: The heart size and mediastinal contours are within normal limits. No pneumothorax is noted. No significant pleural effusion is noted. Patchy alveolar opacities are noted bilaterally concerning for pneumonia. The visualized skeletal structures  are unremarkable. IMPRESSION: Bilateral patchy alveolar opacities are noted concerning for pneumonia. Electronically Signed   By: Marijo Conception, M.D.   On: 03/02/2016 20:05        Scheduled Meds: . collagenase   Topical Daily  . [START ON 03/04/2016] enoxaparin (LOVENOX) injection  20 mg Subcutaneous Q24H  . feeding supplement (ENSURE ENLIVE)  237 mL Oral BID BM  . [START ON 03/04/2016] Influenza vac split quadrivalent PF  0.5 mL Intramuscular Tomorrow-1000  . piperacillin-tazobactam (ZOSYN)  IV  3.375 g Intravenous Q8H  . sodium chloride flush  3 mL Intravenous Q12H  . vancomycin  500 mg Intravenous Q12H   Continuous Infusions:   LOS: 1 day     Oliver Heitzenrater Gerome Apley, MD [facility] Pager  [phone]  If 7PM-7AM, please contact night-coverage [email or web address] Password [email or web address] 03/03/2016, 3:29 PM

## 2016-03-03 NOTE — Progress Notes (Signed)
Initial Nutrition Assessment  DOCUMENTATION CODES:   Underweight  INTERVENTION:    Ensure Enlive po BID, each supplement provides 350 kcal and 20 grams of protein  NUTRITION DIAGNOSIS:   Increased nutrient needs related to acute illness, wound healing as evidenced by estimated needs  GOAL:   Patient will meet greater than or equal to 90% of their needs  MONITOR:   PO intake, Supplement acceptance, Labs, Weight trends, Skin, I & O's  REASON FOR ASSESSMENT:   Consult Assessment of nutrition requirement/status  ASSESSMENT:   41 y.o. Female with history of MS and lower extremity paralysis presented to ED for altered mental status and fever.  RD unable to obtain nutrition hx from pt upon visit. Per Malnutrition Screening Tool Report, has been eating poorly because of a decreased appetite. No % PO intake available per flowsheets at this time. Labs and medications reviewed. CWOCN note reviewed. CBG 104.  Nutrition Focused Physical Exam not applicable given muscle loss history.  Diet Order:  Diet regular Room service appropriate? Yes; Fluid consistency: Thin  Skin:  Wound (see comment) (Numerous full thickness areas of skin breakdown on thoracic and lumbar spine, buttocks, and perneal area)  Last BM:  1/31  Height:   Ht Readings from Last 1 Encounters:  03/02/16 5\' 9"  (1.753 m)    Weight:   Wt Readings from Last 1 Encounters:  03/03/16 91 lb 14.4 oz (41.7 kg)    Ideal Body Weight:  66 kg  BMI:  Body mass index is 13.57 kg/m.  Estimated Nutritional Needs:   Kcal:  1300-1500  Protein:  55-65 gm  Fluid:  >/= 1.5 L  EDUCATION NEEDS:   No education needs identified at this time  Arthur Holms, RD, LDN Pager #: [phone] After-Hours Pager #: [phone]

## 2016-03-03 NOTE — Consult Note (Signed)
[place] Nurse wound consult note Reason for Consult: Numerous full thickness areas of skin breakdown on thoracic and lumbar spine, buttocks, and perneal area.  Pt with fecal incontinence. Wound type: Pressure vs. Moisture vs infectious vs immunologic Pressure Injury POA: Yes Measurement: Numerous, greater than 20 full thickness lesions present, the largest of which is a central sacral unstageable pressure injury measuring 6cm by 6cm unable to determin depth due to presence of necrotic tissue. Surrounding lesions are circular and with necrotic wound bed. Wound bed:see above and photo by Dr T. Opyd dated 03/02/2016 Drainage (amount, consistency, odor) minimal amounts of serous exudate Periwound: maceration noted in intergluteal cleft. Other areas between ulcers intact and dry. Dressing procedure/placement/frequency: I have implemented a therapeutic mattress with low air loss therapy and have ordered hydrotherapy 6 days a week, (bedside RN to perform wound care on Sunday) Collagenase (Santyl) daily topped with saline moistened gauze. Nursing has been provided with guidance for turning and repositioning off of the sacrum via the Orders. We will not follow, but will remain available to this patient, to nursing, and the medical and/or surgical teams.  Please re-consult if wound deteriorates or progress is inconsistent with patient's overall status.   Fara Olden, RN-C, WTA-C Wound Treatment Associate

## 2016-03-04 ENCOUNTER — Encounter (HOSPITAL_COMMUNITY): Payer: Self-pay | Admitting: General Practice

## 2016-03-04 ENCOUNTER — Inpatient Hospital Stay (HOSPITAL_COMMUNITY): Payer: Medicare Other

## 2016-03-04 LAB — CBC WITH DIFFERENTIAL/PLATELET
Basophils Absolute: 0 10*3/uL (ref 0.0–0.1)
Basophils Relative: 0 %
EOS ABS: 0 10*3/uL (ref 0.0–0.7)
Eosinophils Relative: 0 %
HEMATOCRIT: 30.2 % — AB (ref 36.0–46.0)
HEMOGLOBIN: 9 g/dL — AB (ref 12.0–15.0)
LYMPHS PCT: 12 %
Lymphs Abs: 0.7 10*3/uL (ref 0.7–4.0)
MCH: 27.4 pg (ref 26.0–34.0)
MCHC: 29.8 g/dL — ABNORMAL LOW (ref 30.0–36.0)
MCV: 92.1 fL (ref 78.0–100.0)
Monocytes Absolute: 0.4 10*3/uL (ref 0.1–1.0)
Monocytes Relative: 6 %
NEUTROS ABS: 5 10*3/uL (ref 1.7–7.7)
NEUTROS PCT: 82 %
Platelets: 424 10*3/uL — ABNORMAL HIGH (ref 150–400)
RBC: 3.28 MIL/uL — AB (ref 3.87–5.11)
RDW: 13.6 % (ref 11.5–15.5)
WBC: 6.1 10*3/uL (ref 4.0–10.5)

## 2016-03-04 LAB — VANCOMYCIN, TROUGH: VANCOMYCIN TR: 7 ug/mL — AB (ref 15–20)

## 2016-03-04 LAB — GLUCOSE, CAPILLARY: Glucose-Capillary: 110 mg/dL — ABNORMAL HIGH (ref 65–99)

## 2016-03-04 MED ORDER — RESOURCE THICKENUP CLEAR PO POWD
ORAL | Status: DC | PRN
  Filled 2016-03-04: qty 125

## 2016-03-04 MED ORDER — POTASSIUM CHLORIDE CRYS ER 20 MEQ PO TBCR
40.0000 meq | EXTENDED_RELEASE_TABLET | Freq: Once | ORAL | Status: AC
  Administered 2016-03-04: 40 meq via ORAL
  Filled 2016-03-04: qty 2

## 2016-03-04 MED ORDER — SODIUM CHLORIDE 0.9 % IV SOLN
30.0000 meq | Freq: Once | INTRAVENOUS | Status: AC
  Administered 2016-03-04: 30 meq via INTRAVENOUS
  Filled 2016-03-04: qty 15

## 2016-03-04 NOTE — Clinical Social Work Note (Signed)
Clinical Social Work Assessment  Patient Details  Name: Deanna Baldwin MRN: [id number] Date of Birth: 1975-12-29  Date of referral:  03/04/16               Reason for consult:  Facility Placement                Permission sought to share information with:  Facility Sport and exercise psychologist, Family Supports Permission granted to share information::  Yes, Verbal Permission Granted  Name::     Runner, broadcasting/film/video::  SNFs  Relationship::  Sister  Contact Information:  [phone]  Housing/Transportation Living arrangements for the past 2 months:  Apartment Source of Information:  Patient, Other (Comment Required) Patient Interpreter Needed:  None Criminal Activity/Legal Involvement Pertinent to Current Situation/Hospitalization:  No - Comment as needed Significant Relationships:  Siblings, Dependent Children, Other Family Members Lives with:  Minor Children Do you feel safe going back to the place where you live?  No Need for family participation in patient care:  Yes (Comment)  Care giving concerns:  CSW received consult for possible SNF placement at time of discharge. CSW spoke with patient and patient's sister, Deanna Baldwin. Patient reported that patient's 46 year old son is currently unable to care for patient at their home given patient's current physical needs and fall risk. Patient expressed understanding of recommendation and is agreeable to SNF placement at time of discharge. CSW to continue to follow and assist with discharge planning needs.   Social Worker assessment / plan:  CSW spoke with patient concerning possibility of rehab at [facility] before returning home.  Employment status:  Disabled (Comment on whether or not currently receiving Disability) Insurance information:  Medicare PT Recommendations:  Not assessed at this time Information / Referral to community resources:  [place]  Patient/Family's Response to care:  Patient recognizes need for rehab before returning  home and is agreeable to a SNF in [place] that does hydrotherapy.  Patient/Family's Understanding of and Emotional Response to Diagnosis, Current Treatment, and Prognosis:  Patient/family is realistic regarding therapy needs and expressed being hopeful for SNF placement. Patient expressed understanding of CSW role and discharge process. No questions/concerns about plan or treatment.    Emotional Assessment Appearance:  Appears stated age Attitude/Demeanor/Rapport:  Other (Appropriate) Affect (typically observed):  Appropriate Orientation:  Oriented to Self, Oriented to Situation, Oriented to Place, Oriented to  Time Alcohol / Substance use:  Not Applicable Psych involvement (Current and /or in the community):  No (Comment)  Discharge Needs  Concerns to be addressed:  Care Coordination Readmission within the last 30 days:  No Current discharge risk:  None Barriers to Discharge:  Continued Medical Work up   [employer], [place] 03/04/2016, 11:24 AM

## 2016-03-04 NOTE — Evaluation (Signed)
Clinical/Bedside Swallow Evaluation Patient Details  Name: Deanna Baldwin MRN: [id number] Date of Birth: 12-Jun-1975  Today's Date: 03/04/2016 Time: SLP Start Time (ACUTE ONLY): 62 SLP Stop Time (ACUTE ONLY): 1041 SLP Time Calculation (min) (ACUTE ONLY): 21 min  Past Medical History:  Past Medical History:  Diagnosis Date  . Buttock wound 03/03/2016  . MS (multiple sclerosis) ([place])   . Pneumonia 02/2016  . Protein calorie malnutrition ([place])   . Severe sepsis ([place]) 03/03/2016  . UTI (urinary tract infection) 02/2016   Past Surgical History:  Past Surgical History:  Procedure Laterality Date  . NO PAST SURGERIES     HPI:  Ptis a 41 y.o.femalewith PMH significant for MS with spastic paraplegia, presenting ED for eval of AMS and fever. Deanna Baldwin had upper respiratory illness for last 2-3 weeks, but noted today re-worsening. She was found at home confused and lethargic with temperature reportedly 104 F. Found to be septic and positive for urine infection. CXR from 03/02/2016 showed bilateral patchy alveolar opacities are noted concerning for pneumonia.   Assessment / Plan / Recommendation Clinical Impression  [name] presents with MS, pneumonia, and a congested cough that was noted prior to PO consumption. She denies any difficulty with swallowing. Deanna Baldwin observed with thin liquids via cup/straw, puree, and regular solids with s/s of aspiration during all tested consistencies such as immediate and delayed coughing. Recommend instrumental assessment (MBS) to assess Deanna Baldwin's swallow functioning, safety and efficiency, as it was difficult to discern if coughing after intake of POs was due to aspiration or current pneumonia. MBS scheduled for today (03/04/16) at 1330; if needed, whole meds in puree okay before swallow evaluation.    Aspiration Risk  Moderate aspiration risk    Diet Recommendation Other (Comment) (meds with applesauce if needed)   Medication Administration: Whole meds with  puree Supervision: Staff to assist with self feeding;Intermittent supervision to cue for compensatory strategies Compensations: Slow rate;Small sips/bites;Minimize environmental distractions Postural Changes: Seated upright at 90 degrees    Other  Recommendations Oral Care Recommendations: Oral care BID   Follow up Recommendations Other (comment) (TBD)      Frequency and Duration min 2x/week  2 weeks       Prognosis Prognosis for Safe Diet Advancement: Good Barriers to Reach Goals: Cognitive deficits;Severity of deficits      Swallow Study   General HPI: Ptis a 41 y.o.femalewith PMH significant for MS with spastic paraplegia, presenting ED for eval of AMS and fever. Deanna Baldwin had upper respiratory illness for last 2-3 weeks, but noted today re-worsening. She was found at home confused and lethargic with temperature reportedly 104 F. Found to be septic and positive for urine infection. CXR from 03/02/2016 showed bilateral patchy alveolar opacities are noted concerning for pneumonia. Type of Study: Bedside Swallow Evaluation Diet Prior to this Study: Regular;Thin liquids Temperature Spikes Noted: Yes Respiratory Status: Room air History of Recent Intubation: No Behavior/Cognition: Alert;Cooperative;Pleasant mood;Confused;Other (Comment) (intermittent confusion ) Oral Cavity Assessment: Within Functional Limits Oral Care Completed by SLP: No Oral Cavity - Dentition: Missing dentition Vision: Functional for self-feeding Self-Feeding Abilities: Able to feed self;Needs assist Patient Positioning: Upright in bed Baseline Vocal Quality: Normal Volitional Cough: Congested Volitional Swallow: Able to elicit    Oral/Motor/Sensory Function Overall Oral Motor/Sensory Function: Mild impairment Facial ROM: Within Functional Limits Facial Symmetry: Within Functional Limits Facial Strength: Within Functional Limits Lingual ROM: Within Functional Limits Lingual Symmetry: Within Functional  Limits Lingual Strength: Within Functional Limits Velum: Other (comment) (reduced velar elevation)  Ice Chips Ice chips: Not tested   Thin Liquid Thin Liquid: Impaired Presentation: Cup;Straw Pharyngeal  Phase Impairments: Suspected delayed Swallow;Cough - Immediate;Cough - Delayed    Nectar Thick Nectar Thick Liquid: Not tested   Honey Thick Honey Thick Liquid: Not tested   Puree Puree: Impaired Presentation: Self Fed;Spoon Pharyngeal Phase Impairments: Throat Clearing - Delayed;Throat Clearing - Immediate   Solid   GO   Solid: Impaired Presentation: Self Fed Pharyngeal Phase Impairments: Cough - Immediate;Cough - Delayed        Fransisca Kaufmann , Student-SLP 03/04/2016,12:15 PM

## 2016-03-04 NOTE — NC FL2 (Signed)
[place] MEDICAID FL2 LEVEL OF CARE SCREENING TOOL     IDENTIFICATION  Patient Name: Deanna Baldwin Birthdate: 30-Oct-1975 Sex: female Admission Date (Current Location): 03/02/2016  [facility] and [place] Number:  Herbalist and Address:  [facility] [place]. [facility], [place] [address], [place], [place] [address]      Provider Number: [id number]  Attending Physician Name and Address:  Domenic Polite, MD  Relative Name and Phone Number:  Vaughan Basta, aunt, [phone]    Current Level of Care: Hospital Recommended Level of Care: [place] Prior Approval Number:    Date Approved/Denied:   PASRR Number: [id number]  Discharge Plan: SNF    Current Diagnoses: Patient Active Problem List   Diagnosis Date Noted  . Pressure injury of skin 03/03/2016  . 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 Severe sepsis ([place]) 03/02/2016  . CAP (community acquired pneumonia) 03/02/2016  . UTI (urinary tract infection) 03/02/2016  . Abnormal LFTs 03/02/2016  . Buttock wound 03/02/2016  . Protein calorie malnutrition ([place]) 03/02/2016    Orientation RESPIRATION BLADDER Height & Weight     Self, Time, Situation, Place  Normal Incontinent Weight: 41.7 kg (91 lb 14.4 oz) Height:  5\' 9"  (175.3 cm)  BEHAVIORAL SYMPTOMS/MOOD NEUROLOGICAL BOWEL NUTRITION STATUS      Continent Diet (Please see DC Summary)  AMBULATORY STATUS COMMUNICATION OF NEEDS Skin   Total Care Verbally PU Stage and Appropriate Care (Unstageable on buttocks)                       Personal Care Assistance Level of Assistance  Bathing, Feeding, Dressing Bathing Assistance: Maximum assistance Feeding assistance: Limited assistance Dressing Assistance: Maximum assistance     Functional Limitations Info             SPECIAL CARE FACTORS FREQUENCY                       Contractures      Additional Factors Info  Code Status, Allergies, Isolation  Precautions Code Status Info: Full Allergies Info: NKA     Isolation Precautions Info: Droplet precautions     Current Medications (03/04/2016):  This is the current hospital active medication list Current Facility-Administered Medications  Medication Dose Route Frequency Provider Last Rate Last Dose  . 0.9 %  sodium chloride infusion   Intravenous Continuous Tawni Millers, MD 75 mL/hr at 03/04/16 [phone]    . acetaminophen (TYLENOL) tablet 650 mg  650 mg Oral Q6H PRN Vianne Bulls, MD       Or  . acetaminophen (TYLENOL) suppository 650 mg  650 mg Rectal Q6H PRN Vianne Bulls, MD      . bisacodyl (DULCOLAX) EC tablet 5 mg  5 mg Oral Daily PRN Vianne Bulls, MD      . collagenase (SANTYL) ointment   Topical Daily Mauricio Gerome Apley, MD      . enoxaparin (LOVENOX) injection 20 mg  20 mg Subcutaneous Q24H Gaynell Face Hiatt, RPH   20 mg at 03/04/16 0909  . feeding supplement (ENSURE ENLIVE) (ENSURE ENLIVE) liquid 237 mL  237 mL Oral BID BM Tawni Millers, MD   237 mL at 03/03/16 1445  . HYDROcodone-acetaminophen (NORCO/VICODIN) 5-325 MG per tablet 1-2 tablet  1-2 tablet Oral Q4H PRN Vianne Bulls, MD      . Influenza vac split quadrivalent PF (FLUARIX) injection 0.5 mL  0.5 mL Intramuscular  Tomorrow-1000 Mauricio Gerome Apley, MD      . ondansetron [facility]) tablet 4 mg  4 mg Oral Q6H PRN Vianne Bulls, MD       Or  . ondansetron (ZOFRAN) injection 4 mg  4 mg Intravenous Q6H PRN Vianne Bulls, MD      . piperacillin-tazobactam (ZOSYN) IVPB 3.375 g  3.375 g Intravenous Q8H Tawni Millers, MD   3.375 g at 03/04/16 [id number]  . polyethylene glycol (MIRALAX / GLYCOLAX) packet 17 g  17 g Oral Daily PRN Ilene Qua Opyd, MD      . potassium chloride 30 mEq in sodium chloride 0.9 % 265 mL (KCL MULTIRUN) IVPB  30 mEq Intravenous Once Jeryl Columbia, NP   30 mEq at 03/04/16 [id number]  . vancomycin (VANCOCIN) 500 mg in sodium chloride 0.9 % 100 mL IVPB  500 mg Intravenous Q12H Otilio Miu, RPH   500 mg at 03/03/16 2222     Discharge Medications: Please see discharge summary for a list of discharge medications.  Relevant Imaging Results:  Relevant Lab Results:   Additional Information SSN: [id number]    Needs air mattress and hydrotherapy  Lissa Morales Ermin Parisien, LCSWA

## 2016-03-04 NOTE — Progress Notes (Signed)
Pt temp 100.5 the parameter to give tylenol is 101 I will continue to monitor pt and will also pass it on to the first shift nurse to monitor pt

## 2016-03-04 NOTE — Progress Notes (Signed)
CRITICAL VALUE ALERT  Critical value received:  Potassium 2.6  Date of notification:  03/04/2016  Time of notification:  0612  Critical value read back: yes  Nurse who received alert:  Bing Plume  MD notified (1st page):  schorr  Time of first page:  [phone]  MD notified (2nd page):  Time of second page:  Responding MD:  N/A  Time MD responded:  N/A

## 2016-03-04 NOTE — Progress Notes (Signed)
Physical Therapy Wound Treatment Patient Details  Name: Deanna Baldwin MRN: [id number] Date of Birth: [birth year]  Today's Date: 03/04/2016 Time: 7588-3254 Time Calculation (min): 39 min  Subjective  Subjective: I didn't realize it was that bad.   Patient and Family Stated Goals: Per pt for wounds to heal.   Date of Onset: 02/02/16 (pt indicates ~1 month ago.) Prior Treatments: None  Pain Score:    Wound Assessment  Pressure Injury 03/03/16 Unstageable - Full thickness tissue loss in which the base of the ulcer is covered by slough (yellow, tan, gray, green or brown) and/or eschar (tan, brown or black) in the wound bed. greater than 20 full thickness lesions  (Active)  Dressing Type Moist to dry 03/03/2016 10:14 PM  Dressing Intact 03/03/2016 10:14 PM  Site / Wound Assessment Dressing in place / Unable to assess 03/03/2016 10:14 PM  Wound Length (cm) 6 cm 03/03/2016  1:00 PM  Wound Width (cm) 6 cm 03/03/2016  1:00 PM     Wound / Incision (Open or Dehisced) 03/04/16 Non-pressure wound Sacrum Largest wound over sacrum (Active)  Dressing Type ABD;Barrier Film (skin prep);Gauze (Comment);Moist to dry;Tape dressing 03/04/2016 12:00 PM  Dressing Changed Changed 03/04/2016 12:00 PM  Dressing Status Clean;Dry;Intact 03/04/2016 12:00 PM  Dressing Change Frequency Daily 03/04/2016 12:00 PM  Site / Wound Assessment Granulation tissue;Pink;Yellow 03/04/2016 12:00 PM  % Wound base Red or Granulating 50% 03/04/2016 12:00 PM  % Wound base Yellow/Fibrinous Exudate 50% 03/04/2016 12:00 PM  Peri-wound Assessment Maceration;Erythema (blanchable) 03/04/2016 12:00 PM  Wound Length (cm) 4.5 cm 03/04/2016 12:00 PM  Wound Width (cm) 6.4 cm 03/04/2016 12:00 PM  Margins Unattached edges (unapproximated) 03/04/2016 12:00 PM  Closure None 03/04/2016 12:00 PM  Drainage Amount Minimal 03/04/2016 12:00 PM  Drainage Description Serosanguineous 03/04/2016 12:00 PM  Treatment Debridement (Selective);Hydrotherapy (Pulse lavage);Packing (Saline  gauze) 03/04/2016 12:00 PM  Santyl applied to wound bed prior to applying dressing.  Hydrotherapy Pulsed lavage therapy - wound location: Sacrum Pulsed Lavage with Suction (psi): 12 psi Pulsed Lavage with Suction - Normal Saline Used: 1000 mL Pulsed Lavage Tip: Tip with splash shield Selective Debridement Selective Debridement - Location: Sacrum Selective Debridement - Tools Used: Forceps;Scissors Selective Debridement - Tissue Removed: Yellow Slough   Wound Assessment and Plan  Wound Therapy - Assess/Plan/Recommendations Wound Therapy - Clinical Statement: pt would benefit from hydrotherapy to promote tissue healing. Wound Therapy - Functional Problem List: Incontinence, decreased mobility Factors Delaying/Impairing Wound Healing: Altered sensation;Incontinence;Infection - systemic/local;Immobility;Multiple medical problems Hydrotherapy Plan: Debridement;Patient/family education;Pulsatile lavage with suction;Dressing change Wound Therapy - Frequency: 6X / week Wound Therapy - Current Recommendations: OT;PT;Case manager/social work Wound Therapy - Follow Up Recommendations: Skilled nursing facility Wound Plan: See Above  Wound Therapy Goals- Improve the function of patient's integumentary system by progressing the wound(s) through the phases of wound healing (inflammation - proliferation - remodeling) by: Decrease Necrotic Tissue to: 20 Decrease Necrotic Tissue - Progress: Goal set today Increase Granulation Tissue to: 80 Increase Granulation Tissue - Progress: Goal set today Goals/treatment plan/discharge plan were made with and agreed upon by patient/family: Yes Time For Goal Achievement: 7 days Wound Therapy - Potential for Goals: Good  Goals will be updated until maximal potential achieved or discharge criteria met.  Discharge criteria: when goals achieved, discharge from hospital, MD decision/surgical intervention, no progress towards goals, refusal/missing three consecutive  treatments without notification or medical reason.  GP     Deanna Baldwin 03/04/2016, PT [phone] 03/04/2016, 12:18 PM

## 2016-03-04 NOTE — Progress Notes (Signed)
Modified Barium Swallow Progress Note  Patient Details  Name: Deanna Baldwin MRN: [id number] Date of Birth: Jul 16, 1975  Today's Date: 03/04/2016  Modified Barium Swallow completed.  Full report located under Chart Review in the Imaging Section.  Brief recommendations include the following:  Clinical Impression  [name] exhibited mild oral dysphagia marked by premature spill to valleculae due to decreased oral cohesion. Mod-severe sensorimotor pharyngeal dysphagia characterized by frank silent aspiration with thin and nectar thick liquids. The SLP attempted various compensatory strategies such as a chin tuck and verbal cues to cough/clear throat, which were not effective and did not prevent pt from penetration or aspiration. No airway compromise with honey thick and solids. Due to the pt's fluctuating cognition/awareness and mulitple sclerosis, recommend diet of dysphagia 3 solids, honey thick liquids (no straw), meds whole in puree, and cough/clear throat after every few bites/sips. ST will continue to f/u for treatment to assess pt's safety/efficiency and swallowing function   Swallow Evaluation Recommendations       SLP Diet Recommendations: Dysphagia 3 (Mech soft) solids;Honey thick liquids   Liquid Administration via: No straw;Cup   Medication Administration: Whole meds with puree   Supervision: Full supervision/cueing for compensatory strategies;Staff to assist with self feeding   Compensations: Slow rate;Small sips/bites;Minimize environmental distractions;Clear throat intermittently;Other (Comment) (cough every 3 sips/bites)   Postural Changes: Seated upright at 90 degrees   Oral Care Recommendations: Oral care BID        Houston Siren 03/04/2016,3:42 PM   Orbie Pyo [place] [phone]

## 2016-03-04 NOTE — Progress Notes (Addendum)
PROGRESS NOTE    Deanna Baldwin  [id number] DOB: [birth year] DOA: 03/02/2016 PCP: [name], MD    Brief Narrative:  41 yo female, with advanced multiple sclerosis, wheelchair-bound at baseline admitted with encephalopathy, fevers, sepsis noted to have abnormal UA and bibasilar pulmonary infiltrates. Improving, responding to antibiotics see below  Assessment & Plan:   1. Sepsis due to urine infection -Blood cxe NGTD x2days -Urine cx with GNR -continue Zosyn, will stop vancomycin  -Continue IV fluids today   2. Pneumonia bilateral, possible aspiration -Continue Zosyn, start vancomycin as above  -SLP evaluation -Wean O2, supportive care  3. Pressure ulcers, sacral, present on admission, stage 2 to 3, multiple. -She is wheelchair/bed bound due to advanced multiple sclerosis -Wound RN consulted, multiple decubitus ulcers noted  4. Multiple sclerosis.  -Follow-up with neurology in [place]   5. Protein calorie malnutrition. BMI 16.  -Supplementation per nutrition recommendations.   DVT prophylaxis: enoxaparin Code Status: full  Family Communication: No family at the bedside  Disposition Plan: home when improved in few days  Consultants:   Wound care  Procedures:    Antimicrobials:   Vancomycin  Zosyn     Subjective: Some cough/congestion  Objective: Vitals:   03/03/16 0452 03/03/16 0601 03/03/16 2237 03/04/16 0623  BP:  93/69 121/67 110/68  Pulse:  100 (!) 124 (!) 101  Resp:  18 20 18   Temp:  98.1 F (36.7 C) 100 F (37.8 C) (!) 100.5 F (38.1 C)  TempSrc:  Oral Oral Oral  SpO2:  95% 94% 99%  Weight: 41.7 kg (91 lb 14.4 oz)     Height:        Intake/Output Summary (Last 24 hours) at 03/04/16 1434 Last data filed at 03/04/16 [id number]  Gross per 24 hour  Intake           156.25 ml  Output                0 ml  Net           156.25 ml   Filed Weights   03/02/16 1915 03/03/16 0452  Weight: 49.9 kg (110 lb) 41.7 kg (91 lb 14.4 oz)     Examination:  General exam: frail, thinly built E ENT: mild pallor but no icterus, oral mucosa moist Respiratory system: ronchi at both bases Cardiovascular system: S1 & S2 heard, RRR. No JVD, murmurs Gastrointestinal system: Abdomen is nondistended, soft and nontender.  Normal bowel sounds heard. Central nervous system: Alert and oriented., muscle wasting noted Extremities: diffuse leg weakness/deformities, chronic Skin: multiple sacral decub ulcers  Data Reviewed: I have personally reviewed following labs and imaging studies  CBC:  Recent Labs Lab 03/02/16 1832 03/03/16 0505 03/04/16 0433  WBC 13.2* 14.2* 6.1  NEUTROABS 11.8* 12.6* 5.0  HGB 11.7* 9.6* 9.0*  HCT 37.8 32.0* 30.2*  MCV 92.0 91.7 92.1  PLT 526* 419* 123456*   Basic Metabolic Panel:  Recent Labs Lab 03/02/16 1832 03/03/16 0505 03/04/16 0433  NA 141 146* 149*  K 3.7 3.0* 2.6*  CL 97* 104 110  CO2 30 31 30   GLUCOSE 163* 135* 120*  BUN 12 9 12   CREATININE 0.77 0.49 0.77  CALCIUM 8.5* 7.9* 7.9*   GFR: Estimated Creatinine Clearance: 62.2 mL/min (by C-G formula based on SCr of 0.77 mg/dL). Liver Function Tests:  Recent Labs Lab 03/02/16 1832 03/03/16 0505  AST 113* 84*  ALT 55* 42  ALKPHOS 118 90  BILITOT 1.7* 0.5  PROT 6.2* 5.2*  ALBUMIN  2.2* 1.7*   No results for input(s): LIPASE, AMYLASE in the last 168 hours. No results for input(s): AMMONIA in the last 168 hours. Coagulation Profile:  Recent Labs Lab 03/02/16 1832  INR 1.28   Cardiac Enzymes:  Recent Labs Lab 03/02/16 2300  TROPONINI <0.03   BNP (last 3 results) No results for input(s): PROBNP in the last 8760 hours. HbA1C: No results for input(s): HGBA1C in the last 72 hours. CBG:  Recent Labs Lab 03/03/16 0806 03/04/16 0810  GLUCAP 104* 110*   Lipid Profile: No results for input(s): CHOL, HDL, LDLCALC, TRIG, CHOLHDL, LDLDIRECT in the last 72 hours. Thyroid Function Tests:  Recent Labs  03/02/16 2020  TSH  0.371   Anemia Panel: No results for input(s): VITAMINB12, FOLATE, FERRITIN, TIBC, IRON, RETICCTPCT in the last 72 hours. Sepsis Labs:  Recent Labs Lab 03/02/16 1843 03/02/16 2024 03/02/16 2252  PROCALCITON  --   --  2.25  LATICACIDVEN 3.45* 1.3 1.1    Recent Results (from the past 240 hour(s))  Culture, blood (Routine x 2)     Status: None (Preliminary result)   Collection Time: 03/02/16  6:32 PM  Result Value Ref Range Status   Specimen Description BLOOD RIGHT FOREARM  Final   Special Requests BOTTLES DRAWN AEROBIC AND ANAEROBIC 5CC  Final   Culture NO GROWTH < 24 HOURS  Final   Report Status PENDING  Incomplete  Culture, blood (Routine x 2)     Status: None (Preliminary result)   Collection Time: 03/02/16  6:39 PM  Result Value Ref Range Status   Specimen Description BLOOD RIGHT HAND  Final   Special Requests BOTTLES DRAWN AEROBIC AND ANAEROBIC 5CC  Final   Culture NO GROWTH < 24 HOURS  Final   Report Status PENDING  Incomplete  Urine culture     Status: Abnormal (Preliminary result)   Collection Time: 03/02/16  7:29 PM  Result Value Ref Range Status   Specimen Description URINE, CATHETERIZED  Final   Special Requests NONE  Final   Culture >=100,000 COLONIES/mL ESCHERICHIA COLI (A)  Final   Report Status PENDING  Incomplete         Radiology Studies: Dg Chest Port 1 View  Result Date: 03/02/2016 CLINICAL DATA:  Fever. EXAM: PORTABLE CHEST 1 VIEW COMPARISON:  None. FINDINGS: The heart size and mediastinal contours are within normal limits. No pneumothorax is noted. No significant pleural effusion is noted. Patchy alveolar opacities are noted bilaterally concerning for pneumonia. The visualized skeletal structures are unremarkable. IMPRESSION: Bilateral patchy alveolar opacities are noted concerning for pneumonia. Electronically Signed   By: Marijo Conception, M.D.   On: 03/02/2016 20:05        Scheduled Meds: . collagenase   Topical Daily  . enoxaparin (LOVENOX)  injection  20 mg Subcutaneous Q24H  . feeding supplement (ENSURE ENLIVE)  237 mL Oral BID BM  . Influenza vac split quadrivalent PF  0.5 mL Intramuscular Tomorrow-1000  . piperacillin-tazobactam (ZOSYN)  IV  3.375 g Intravenous Q8H   Continuous Infusions: . sodium chloride 75 mL/hr at 03/04/16 0832     LOS: 2 days     Domenic Polite, MD [facility] Pager [phone]  If 7PM-7AM, please contact night-coverage [email or web address] Password [facility] 03/04/2016, 2:34 PM

## 2016-03-05 DIAGNOSIS — E870 Hyperosmolality and hypernatremia: Secondary | ICD-10-CM

## 2016-03-05 DIAGNOSIS — E876 Hypokalemia: Secondary | ICD-10-CM | POA: Diagnosis present

## 2016-03-05 LAB — CBC
HEMATOCRIT: 29.4 % — AB (ref 36.0–46.0)
Hemoglobin: 8.8 g/dL — ABNORMAL LOW (ref 12.0–15.0)
MCH: 27.9 pg (ref 26.0–34.0)
MCHC: 29.9 g/dL — AB (ref 30.0–36.0)
MCV: 93.3 fL (ref 78.0–100.0)
Platelets: 360 10*3/uL (ref 150–400)
RBC: 3.15 MIL/uL — ABNORMAL LOW (ref 3.87–5.11)
RDW: 14.1 % (ref 11.5–15.5)
WBC: 2.9 10*3/uL — ABNORMAL LOW (ref 4.0–10.5)

## 2016-03-05 LAB — URINE CULTURE

## 2016-03-05 LAB — BASIC METABOLIC PANEL
Anion gap: 9 (ref 5–15)
Anion gap: 10 (ref 5–15)
BUN: 12 mg/dL (ref 6–20)
BUN: 13 mg/dL (ref 6–20)
CALCIUM: 8.2 mg/dL — AB (ref 8.9–10.3)
CHLORIDE: 110 mmol/L (ref 101–111)
CO2: 30 mmol/L (ref 22–32)
CO2: 27 mmol/L (ref 22–32)
CREATININE: 0.87 mg/dL (ref 0.44–1.00)
Calcium: 7.9 mg/dL — ABNORMAL LOW (ref 8.9–10.3)
Chloride: 121 mmol/L — ABNORMAL HIGH (ref 101–111)
Creatinine, Ser: 0.77 mg/dL (ref 0.44–1.00)
GFR calc Af Amer: >60 mL/min (ref >60)
GFR calc Af Amer: >60 mL/min (ref >60)
GFR calc non Af Amer: >60 mL/min (ref >60)
GFR calc non Af Amer: >60 mL/min (ref >60)
GLUCOSE: 110 mg/dL — AB (ref 65–99)
Glucose, Bld: 120 mg/dL — ABNORMAL HIGH (ref 65–99)
POTASSIUM: 2.6 mmol/L — AB (ref 3.5–5.1)
Potassium: 3.5 mmol/L (ref 3.5–5.1)
SODIUM: 149 mmol/L — AB (ref 135–145)
Sodium: 158 mmol/L — ABNORMAL HIGH (ref 135–145)

## 2016-03-05 LAB — GLUCOSE, CAPILLARY: Glucose-Capillary: 102 mg/dL — ABNORMAL HIGH (ref 65–99)

## 2016-03-05 LAB — MAGNESIUM: Magnesium: 2.3 mg/dL (ref 1.7–2.4)

## 2016-03-05 MED ORDER — DEXTROSE 5 % IV SOLN
INTRAVENOUS | Status: DC
  Administered 2016-03-05 – 2016-03-09 (×9): via INTRAVENOUS
  Filled 2016-03-05 (×8): qty 1000

## 2016-03-05 NOTE — Progress Notes (Signed)
PROGRESS NOTE    Deanna Baldwin  [id number] DOB: Mar 17, 1975 DOA: 03/02/2016 PCP: [name], MD    Brief Narrative:  41 yo female, with advanced multiple sclerosis, wheelchair-bound at baseline admitted with encephalopathy, fevers, sepsis noted to have abnormal UA and bibasilar pulmonary infiltrates. Improving, responding to antibiotics see below    Assessment & Plan:   Principal Problem:   Severe sepsis ([place]) Active Problems:   Spastic 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 ([place])   Community acquired pneumonia   UTI (urinary tract infection)   Abnormal LFTs   Buttock wound   Protein calorie malnutrition ([facility])   Pressure injury of skin   Hypokalemia   Hypernatremia   1. Sepsis due to Escherichia coli UTI -Blood cxe NGTD x2days -Urine cx with greater than 100,000 colonies of Escherichia coli. -continue Zosyn. IV vancomycin has been discontinued. Consider narrowing antibiotics to oral Augmentin in the next one to 2 days. -Continue IV fluids.  2. Pneumonia bilateral, possible aspiration -Continue Zosyn. Vancomycin has been discontinued. -SLP as evaluated the patient and patient underwent modified barium swallow current diet recommended is dysphagia 3 diet with honey thick liquids.  -Wean O2, supportive care - Narrow IV Zosyn to oral Augmentin in the next one to 2 days to complete course of antibiotic treatment.  3. Pressure ulcers, sacral, present on admission, stage 2 to 3, multiple. -She is wheelchair/bed bound due to advanced multiple sclerosis -Wound RN consulted, multiple decubitus ulcers noted  4. Multiple sclerosis.  -Follow-up with neurology in [place]   5. Protein calorie malnutrition. BMI 16.  -Supplementation per nutrition recommendations.   6. Hypernatremia -Likely secondary to dehydration and volume depletion. Will change IV fluids to D5W and follow.  7. Hypokalemia Replete.   DVT prophylaxis: Lovenox Code Status: Full Family  Communication: Updated patient. No family at bedside. Disposition Plan: Home once clinically improved and hypernatremia has resolved with good oral intake and tolerating oral antibiotics.   Consultants:   None  Procedures:   Chest x-ray 03/02/2016    Antimicrobials:   IV Zosyn 03/02/2016  IV vancomycin 03/02/2016>>>>> 03/04/2016   Subjective: Patient laying in bed denies any chest pain. No shortness of breath. Patient feeling better than on admission.  Objective: Vitals:   03/04/16 2215 03/05/16 0226 03/05/16 0426 03/05/16 1421  BP: 113/71  121/76 115/61  Pulse: (!) 108  (!) 109 (!) 114  Resp: 19  18 (!) 28  Temp: 99.7 F (37.6 C)  (!) 100.7 F (38.2 C) 98.6 F (37 C)  TempSrc: Oral  Oral Oral  SpO2: 94%  99% 95%  Weight:  36.7 kg (81 lb)    Height:        Intake/Output Summary (Last 24 hours) at 03/05/16 2033 Last data filed at 03/05/16 1137  Gross per 24 hour  Intake              350 ml  Output                0 ml  Net              350 ml   Filed Weights   03/02/16 1915 03/03/16 0452 03/05/16 0226  Weight: 49.9 kg (110 lb) 41.7 kg (91 lb 14.4 oz) 36.7 kg (81 lb)    Examination:  General exam: Appears calm and comfortable.Thin female Respiratory system: Clear to auscultation. Respiratory effort normal. Cardiovascular system: S1 & S2 heard, RRR. No JVD, murmurs, rubs, gallops or clicks. No pedal edema. Gastrointestinal system: Abdomen is  nondistended, soft and nontender. No organomegaly or masses felt. Normal bowel sounds heard. Central nervous system: Alert and oriented. No focal neurological deficits. Extremities: Symmetric 5 x 5 power. Skin: No rashes, lesions or ulcers Psychiatry: Judgement and insight appear normal. Mood & affect appropriate.     Data Reviewed: I have personally reviewed following labs and imaging studies  CBC:  Recent Labs Lab 03/02/16 1832 03/03/16 0505 03/04/16 0433 03/05/16 0800  WBC 13.2* 14.2* 6.1 2.9*  NEUTROABS  11.8* 12.6* 5.0  --   HGB 11.7* 9.6* 9.0* 8.8*  HCT 37.8 32.0* 30.2* 29.4*  MCV 92.0 91.7 92.1 93.3  PLT 526* 419* 424* [id number]   Basic Metabolic Panel:  Recent Labs Lab 03/02/16 1832 03/03/16 0505 03/04/16 0433 03/05/16 0800  NA 141 146* 149* 158*  K 3.7 3.0* 2.6* 3.5  CL 97* 104 110 121*  CO2 30 31 30 27   GLUCOSE 163* 135* 120* 110*  BUN 12 9 12 13   CREATININE 0.77 0.49 0.77 0.87  CALCIUM 8.5* 7.9* 7.9* 8.2*  MG  --   --   --  2.3   GFR: Estimated Creatinine Clearance: 50.3 mL/min (by C-G formula based on SCr of 0.87 mg/dL). Liver Function Tests:  Recent Labs Lab 03/02/16 1832 03/03/16 0505  AST 113* 84*  ALT 55* 42  ALKPHOS 118 90  BILITOT 1.7* 0.5  PROT 6.2* 5.2*  ALBUMIN 2.2* 1.7*   No results for input(s): LIPASE, AMYLASE in the last 168 hours. No results for input(s): AMMONIA in the last 168 hours. Coagulation Profile:  Recent Labs Lab 03/02/16 1832  INR 1.28   Cardiac Enzymes:  Recent Labs Lab 03/02/16 2300  TROPONINI <0.03   BNP (last 3 results) No results for input(s): PROBNP in the last 8760 hours. HbA1C: No results for input(s): HGBA1C in the last 72 hours. CBG:  Recent Labs Lab 03/03/16 0806 03/04/16 0810 03/05/16 0813  GLUCAP 104* 110* 102*   Lipid Profile: No results for input(s): CHOL, HDL, LDLCALC, TRIG, CHOLHDL, LDLDIRECT in the last 72 hours. Thyroid Function Tests: No results for input(s): TSH, T4TOTAL, FREET4, T3FREE, THYROIDAB in the last 72 hours. Anemia Panel: No results for input(s): VITAMINB12, FOLATE, FERRITIN, TIBC, IRON, RETICCTPCT in the last 72 hours. Sepsis Labs:  Recent Labs Lab 03/02/16 1843 03/02/16 2024 03/02/16 2252  PROCALCITON  --   --  2.25  LATICACIDVEN 3.45* 1.3 1.1    Recent Results (from the past 240 hour(s))  Culture, blood (Routine x 2)     Status: None (Preliminary result)   Collection Time: 03/02/16  6:32 PM  Result Value Ref Range Status   Specimen Description BLOOD RIGHT FOREARM   Final   Special Requests BOTTLES DRAWN AEROBIC AND ANAEROBIC 5CC  Final   Culture NO GROWTH 3 DAYS  Final   Report Status PENDING  Incomplete  Culture, blood (Routine x 2)     Status: None (Preliminary result)   Collection Time: 03/02/16  6:39 PM  Result Value Ref Range Status   Specimen Description BLOOD RIGHT HAND  Final   Special Requests BOTTLES DRAWN AEROBIC AND ANAEROBIC 5CC  Final   Culture NO GROWTH 3 DAYS  Final   Report Status PENDING  Incomplete  Urine culture     Status: Abnormal   Collection Time: 03/02/16  7:29 PM  Result Value Ref Range Status   Specimen Description URINE, CATHETERIZED  Final   Special Requests NONE  Final   Culture >=100,000 COLONIES/mL ESCHERICHIA COLI (A)  Final  Report Status 03/05/2016 FINAL  Final   Organism ID, Bacteria ESCHERICHIA COLI (A)  Final      Susceptibility   Escherichia coli - MIC*    AMPICILLIN <=2 SENSITIVE Sensitive     CEFAZOLIN <=4 SENSITIVE Sensitive     CEFTRIAXONE <=1 SENSITIVE Sensitive     CIPROFLOXACIN <=0.25 SENSITIVE Sensitive     GENTAMICIN <=1 SENSITIVE Sensitive     IMIPENEM <=0.25 SENSITIVE Sensitive     NITROFURANTOIN 32 SENSITIVE Sensitive     TRIMETH/SULFA <=20 SENSITIVE Sensitive     AMPICILLIN/SULBACTAM <=2 SENSITIVE Sensitive     PIP/TAZO <=4 SENSITIVE Sensitive     Extended ESBL NEGATIVE Sensitive     * >=100,000 COLONIES/mL ESCHERICHIA COLI         Radiology Studies: Dg Swallowing Func-speech Pathology  Result Date: 03/04/2016 Objective Swallowing Evaluation: Type of Study: MBS-Modified Barium Swallow Study Patient Details Name: Rocky Vogelsong MRN: [id number] Date of Birth: 04/13/1975 Today's Date: 03/04/2016 Time: SLP Start Time (ACUTE ONLY): 1335-SLP Stop Time (ACUTE ONLY): 1355 SLP Time Calculation (min) (ACUTE ONLY): 20 min Past Medical History: Past Medical History: Diagnosis Date . Buttock wound 03/03/2016 . MS (multiple sclerosis) ([place])  . Pneumonia 02/2016 . Protein calorie malnutrition ([place])  .  Severe sepsis ([place]) 03/03/2016 . UTI (urinary tract infection) 02/2016 Past Surgical History: Past Surgical History: Procedure Laterality Date . NO PAST SURGERIES   HPI: Ptis a 41 y.o.femalewith PMH significant for MS with spastic paraplegia, presenting ED for eval of AMS and fever. Pt had upper respiratory illness for last 2-3 weeks, but noted today re-worsening. She was found at home confused and lethargic with temperature reportedly 104 F. Found to be septic and positive for urine infection. CXR from 03/02/2016 showed bilateral patchy alveolar opacities are noted concerning for pneumonia. No Data Recorded Assessment / Plan / Recommendation CHL IP CLINICAL IMPRESSIONS 03/04/2016 Therapy Diagnosis Mild oral phase dysphagia;Moderate pharyngeal phase dysphagia;Severe pharyngeal phase dysphagia Clinical Impression Ms. Ehrmann exhibited mild oral dysphagia marked by premature spill to valleculae due to decreased oral cohesion. Mod-severe sensorimotor pharyngeal dysphagia characterized by frank silent aspiration with thin and nectar thick liquids. The SLP attempted various compensatory strategies such as a chin tuck and verbal cues to cough/clear throat, which were not effective and did not prevent pt from penetration or aspiration. No airway compromise with honey thick and solids. Due to the pt's fluctuating cognition/awareness and mulitple sclerosis, recommend diet of dysphagia 3 solids, honey thick liquids (no straw), meds whole in puree, and cough/clear throat after every few bites/sips. ST will continue to f/u for treatment to assess pt's safety/efficiency and swallowing function Impact on safety and function Severe aspiration risk   CHL IP TREATMENT RECOMMENDATION 03/04/2016 Treatment Recommendations Therapy as outlined in treatment plan below   Prognosis 03/04/2016 Prognosis for Safe Diet Advancement Fair Barriers to Reach Goals Cognitive deficits;Severity of deficits Barriers/Prognosis Comment -- CHL IP DIET  RECOMMENDATION 03/04/2016 SLP Diet Recommendations Dysphagia 3 (Mech soft) solids;Honey thick liquids Liquid Administration via No straw;Cup Medication Administration Whole meds with puree Compensations Slow rate;Small sips/bites;Minimize environmental distractions;Clear throat intermittently;Other (Comment) Postural Changes Seated upright at 90 degrees   CHL IP OTHER RECOMMENDATIONS 03/04/2016 Recommended Consults -- Oral Care Recommendations Oral care BID Other Recommendations --   CHL IP FOLLOW UP RECOMMENDATIONS 03/04/2016 Follow up Recommendations Other (comment)   CHL IP FREQUENCY AND DURATION 03/04/2016 Speech Therapy Frequency (ACUTE ONLY) min 2x/week Treatment Duration 2 weeks      CHL IP ORAL PHASE 03/04/2016 Oral  Phase Impaired Oral - Pudding Teaspoon -- Oral - Pudding Cup -- Oral - Honey Teaspoon -- Oral - Honey Cup -- Oral - Nectar Teaspoon -- Oral - Nectar Cup Decreased bolus cohesion;Premature spillage Oral - Nectar Straw -- Oral - Thin Teaspoon -- Oral - Thin Cup Premature spillage;Decreased bolus cohesion Oral - Thin Straw -- Oral - Puree -- Oral - Mech Soft -- Oral - Regular -- Oral - Multi-Consistency -- Oral - Pill -- Oral Phase - Comment --  CHL IP PHARYNGEAL PHASE 03/04/2016 Pharyngeal Phase Impaired Pharyngeal- Pudding Teaspoon -- Pharyngeal -- Pharyngeal- Pudding Cup -- Pharyngeal -- Pharyngeal- Honey Teaspoon -- Pharyngeal -- Pharyngeal- Honey Cup WFL Pharyngeal -- Pharyngeal- Nectar Teaspoon -- Pharyngeal -- Pharyngeal- Nectar Cup Penetration/Aspiration before swallow Pharyngeal Material enters airway, passes BELOW cords without attempt by patient to eject out (silent aspiration) Pharyngeal- Nectar Straw -- Pharyngeal -- Pharyngeal- Thin Teaspoon -- Pharyngeal -- Pharyngeal- Thin Cup Penetration/Aspiration before swallow;Penetration/Aspiration during swallow Pharyngeal Material enters airway, passes BELOW cords without attempt by patient to eject out (silent aspiration) Pharyngeal- Thin Straw --  Pharyngeal -- Pharyngeal- Puree -- Pharyngeal -- Pharyngeal- Mechanical Soft -- Pharyngeal -- Pharyngeal- Regular WFL Pharyngeal -- Pharyngeal- Multi-consistency -- Pharyngeal -- Pharyngeal- Pill -- Pharyngeal -- Pharyngeal Comment --  CHL IP CERVICAL ESOPHAGEAL PHASE 03/04/2016 Cervical Esophageal Phase Impaired Pudding Teaspoon -- Pudding Cup -- Honey Teaspoon -- Honey Cup -- Nectar Teaspoon -- Nectar Cup -- Nectar Straw -- Thin Teaspoon -- Thin Cup -- Thin Straw -- Puree -- Mechanical Soft -- Regular -- Multi-consistency -- Pill -- Cervical Esophageal Comment -- No flowsheet data found. [place] Siren 03/04/2016, 3:37 PM Orbie Pyo Colvin Caroli.Ed CCC-SLP Pager [phone]                   Scheduled Meds: . collagenase   Topical Daily  . enoxaparin (LOVENOX) injection  20 mg Subcutaneous Q24H  . feeding supplement (ENSURE ENLIVE)  237 mL Oral BID BM  . Influenza vac split quadrivalent PF  0.5 mL Intramuscular Tomorrow-1000  . piperacillin-tazobactam (ZOSYN)  IV  3.375 g Intravenous Q8H   Continuous Infusions: . dextrose 5 % 1,000 mL infusion 100 mL/hr at 03/05/16 1105     LOS: 3 days    Time spent: 13 minutes    THOMPSON,DANIEL, MD [facility] Pager [phone]  If 7PM-7AM, please contact night-coverage [email or web address] Password [facility] 03/05/2016, 8:33 PM

## 2016-03-05 NOTE — Progress Notes (Signed)
Speech Language Pathology Treatment: Dysphagia  Patient Details Name: Deanna Baldwin MRN: [id number] DOB: 1975/06/02 Today's Date: 03/05/2016 Time: [id number] SLP Time Calculation (min) (ACUTE ONLY): 16 min  Assessment / Plan / Recommendation Clinical Impression  Deanna Baldwin required moderate verbal, visual, and tactile cueing and set up/assistance during dysphagia treatment. SLP educated and reminded pt to use strategies for safe swallowing such as small bites/sips and coughing/throat clearing after every few bites, but was unable to do so. Deanna Baldwin was observed with dysphagia 3 solids and honey thick liquids and exhibited no signs of airway compromise at bedside; prolonged mastication was noted during solids. ST will continue to f/u for treatment to assess safety/efficiency of swallowing function and possible diet upgrade.   HPI HPI: Ptis a 41 y.o.femalewith PMH significant for MS with spastic paraplegia, presenting ED for eval of AMS and fever. Pt had upper respiratory illness for last 2-3 weeks, but noted today re-worsening. She was found at home confused and lethargic with temperature reportedly 104 F. Found to be septic and positive for urine infection. CXR from 03/02/2016 showed bilateral patchy alveolar opacities are noted concerning for pneumonia. Silent aspiration noted during thin liquids and nectar thick on MBS from 03/04/2016.       SLP Plan  Continue with current plan of care     Recommendations  Diet recommendations: Dysphagia 3 (mechanical soft);Honey-thick liquid Liquids provided via: Cup Medication Administration: Whole meds with puree Compensations: Slow rate;Small sips/bites;Minimize environmental distractions;Clear throat intermittently;Other (Comment) Postural Changes and/or Swallow Maneuvers: Seated upright 90 degrees                Oral Care Recommendations: Oral care BID Follow up Recommendations: Other (comment) Plan: Continue with current plan of  care       [place] , [place] 03/05/2016, 9:38 AM

## 2016-03-05 NOTE — Progress Notes (Signed)
Physical Therapy Wound Treatment Patient Details  Name: Deanna Baldwin MRN: [id number] Date of Birth: [birth year]  Today's Date: 03/05/2016 Time: 1030-1053 Time Calculation (min): 23 min  Subjective  Subjective: "You guys are the best and everyone is so nice." Patient and Family Stated Goals: Per pt for wounds to heal.   Date of Onset: 02/02/16 (pt indicates ~1 month ago.) Prior Treatments: None  Pain Score: Pain Score: 0-No pain  Wound Assessment  Pressure Injury 03/03/16 Unstageable - Full thickness tissue loss in which the base of the ulcer is covered by slough (yellow, tan, gray, green or brown) and/or eschar (tan, brown or black) in the wound bed. greater than 20 full thickness lesions  (Active)  Dressing Type Moist to dry 03/05/2016  9:00 AM  Dressing Intact 03/05/2016  9:00 AM  Site / Wound Assessment Dressing in place / Unable to assess 03/05/2016  9:00 AM  Wound Length (cm) 6 cm 03/03/2016  1:00 PM  Wound Width (cm) 6 cm 03/03/2016  1:00 PM  Drainage Amount Moderate 03/04/2016  7:56 PM     Wound / Incision (Open or Dehisced) 03/04/16 Non-pressure wound Sacrum Largest wound over sacrum (Active)  Dressing Type ABD;Barrier Film (skin prep);Gauze (Comment);Moist to dry;Tape dressing 03/05/2016  2:54 PM  Dressing Changed Changed 03/04/2016 12:00 PM  Dressing Status Clean;Dry;Intact 03/05/2016  2:54 PM  Dressing Change Frequency Daily 03/05/2016  2:54 PM  Site / Wound Assessment Granulation tissue;Pink;Yellow 03/05/2016  2:54 PM  % Wound base Red or Granulating 50% 03/05/2016  2:54 PM  % Wound base Yellow/Fibrinous Exudate 50% 03/05/2016  2:54 PM  Peri-wound Assessment Maceration;Erythema (blanchable) 03/05/2016  2:54 PM  Wound Length (cm) 4.5 cm 03/04/2016 12:00 PM  Wound Width (cm) 6.4 cm 03/04/2016 12:00 PM  Margins Unattached edges (unapproximated) 03/05/2016  2:54 PM  Closure None 03/05/2016  2:54 PM  Drainage Amount Minimal 03/05/2016  2:54 PM  Drainage Description Serosanguineous 03/05/2016  2:54 PM   Treatment Debridement (Selective);Hydrotherapy (Pulse lavage);Packing (Saline gauze) 03/04/2016 12:00 PM   Santyl applied to wound bed prior to applying dressing.   Hydrotherapy Pulsed lavage therapy - wound location: Sacrum Pulsed Lavage with Suction (psi): 12 psi Pulsed Lavage with Suction - Normal Saline Used: 1000 mL Pulsed Lavage Tip: Tip with splash shield Selective Debridement Selective Debridement - Location: Sacrum Selective Debridement - Tools Used: Scalpel Selective Debridement - Tissue Removed: Yellow Slough   Wound Assessment and Plan  Wound Therapy - Assess/Plan/Recommendations Wound Therapy - Clinical Statement: Pt continues to benefit from hydo therapy treatment.   Pt tolerated treatment with no c/o pain and able to tolerate sidelying to the R well during session.   Wound Therapy - Functional Problem List: Incontinence, decreased mobility (urinary incontinence during wound tx x2. ) Factors Delaying/Impairing Wound Healing: Altered sensation;Incontinence;Infection - systemic/local;Immobility;Multiple medical problems Hydrotherapy Plan: Debridement;Patient/family education;Pulsatile lavage with suction;Dressing change Wound Therapy - Frequency: 6X / week Wound Therapy - Current Recommendations: OT;PT;Case manager/social work Wound Therapy - Follow Up Recommendations: Skilled nursing facility Wound Plan: See Above  Wound Therapy Goals- Improve the function of patient's integumentary system by progressing the wound(s) through the phases of wound healing (inflammation - proliferation - remodeling) by: Decrease Necrotic Tissue to: 20 Decrease Necrotic Tissue - Progress: Progressing toward goal Increase Granulation Tissue to: 80 Increase Granulation Tissue - Progress: Progressing toward goal Goals/treatment plan/discharge plan were made with and agreed upon by patient/family: Yes Time For Goal Achievement: 7 days Wound Therapy - Potential for Goals: Good  Goals will be  updated until maximal potential achieved or discharge criteria met.  Discharge criteria: when goals achieved, discharge from hospital, MD decision/surgical intervention, no progress towards goals, refusal/missing three consecutive treatments without notification or medical reason.  GP     Deanna Baldwin Eli Hose 03/05/2016, 2:59 PM

## 2016-03-06 DIAGNOSIS — E43 Unspecified severe protein-calorie malnutrition: Secondary | ICD-10-CM

## 2016-03-06 LAB — BASIC METABOLIC PANEL
Anion gap: 8 (ref 5–15)
BUN: 7 mg/dL (ref 6–20)
CALCIUM: 7.9 mg/dL — AB (ref 8.9–10.3)
CO2: 29 mmol/L (ref 22–32)
CREATININE: 0.79 mg/dL (ref 0.44–1.00)
Chloride: 113 mmol/L — ABNORMAL HIGH (ref 101–111)
GFR calc non Af Amer: >60 mL/min (ref >60)
GLUCOSE: 119 mg/dL — AB (ref 65–99)
Potassium: 2.8 mmol/L — ABNORMAL LOW (ref 3.5–5.1)
Sodium: 150 mmol/L — ABNORMAL HIGH (ref 135–145)

## 2016-03-06 LAB — CBC
HCT: 26.8 % — ABNORMAL LOW (ref 36.0–46.0)
Hemoglobin: 8.1 g/dL — ABNORMAL LOW (ref 12.0–15.0)
MCH: 28.2 pg (ref 26.0–34.0)
MCHC: 30.2 g/dL (ref 30.0–36.0)
MCV: 93.4 fL (ref 78.0–100.0)
PLATELETS: 322 10*3/uL (ref 150–400)
RBC: 2.87 MIL/uL — AB (ref 3.87–5.11)
RDW: 14.4 % (ref 11.5–15.5)
WBC: 2.3 10*3/uL — ABNORMAL LOW (ref 4.0–10.5)

## 2016-03-06 LAB — GLUCOSE, CAPILLARY: GLUCOSE-CAPILLARY: 98 mg/dL (ref 65–99)

## 2016-03-06 MED ORDER — POTASSIUM CHLORIDE CRYS ER 20 MEQ PO TBCR
50.0000 meq | EXTENDED_RELEASE_TABLET | Freq: Once | ORAL | Status: AC
  Administered 2016-03-06: 50 meq via ORAL
  Filled 2016-03-06: qty 1

## 2016-03-06 MED ORDER — WHITE PETROLATUM GEL
Status: AC
  Administered 2016-03-06: 12:00:00
  Filled 2016-03-06: qty 1

## 2016-03-06 NOTE — Progress Notes (Signed)
Physical Therapy Wound Treatment Patient Details  Name: Deanna Baldwin MRN: [id number] Date of Birth: Feb 06, 1975  Today's Date: 03/06/2016 Time: 9449-6759 Time Calculation (min): 39 min  Subjective  Subjective: Pt pleasant and agreeable to therapy. Asking for pain medication prior to start of treatment.  Patient and Family Stated Goals: Per pt for wounds to heal.   Date of Onset: 02/02/16 (pt indicates ~1 month ago.) Prior Treatments: None  Pain Score: Pt premedicated   Wound Assessment  Wound / Incision (Open or Dehisced) 03/04/16 Non-pressure wound Sacrum Largest wound over sacrum (Active)  Dressing Type Moist to dry 03/06/2016 12:56 PM  Dressing Changed Changed 03/06/2016 12:56 PM  Dressing Status Clean;Dry;Intact 03/06/2016 12:56 PM  Dressing Change Frequency Daily 03/06/2016 12:56 PM  Site / Wound Assessment Granulation tissue;Pink;Yellow 03/06/2016 12:56 PM  % Wound base Red or Granulating 30% 03/06/2016 12:56 PM  % Wound base Yellow/Fibrinous Exudate 70% 03/06/2016 12:56 PM  Peri-wound Assessment Maceration;Erythema (blanchable) 03/06/2016 12:56 PM  Wound Length (cm) 4.5 cm 03/04/2016 12:00 PM  Wound Width (cm) 6.4 cm 03/04/2016 12:00 PM  Margins Unattached edges (unapproximated) 03/06/2016 12:56 PM  Closure None 03/06/2016 12:56 PM  Drainage Amount Minimal 03/06/2016 12:56 PM  Drainage Description Serosanguineous 03/06/2016 12:56 PM  Treatment Hydrotherapy (Pulse lavage);Packing (Saline gauze) 03/06/2016 12:56 PM  Santyl applied to wound bed prior to applying dressing.   Hydrotherapy Pulsed lavage therapy - wound location: Sacrum Pulsed Lavage with Suction (psi): 12 psi Pulsed Lavage with Suction - Normal Saline Used: 1000 mL Pulsed Lavage Tip: Tip with splash shield   Wound Assessment and Plan  Wound Therapy - Assess/Plan/Recommendations Wound Therapy - Clinical Statement: Pt incontinent of stool upon arrival and required assist cleaning up. Pt will continue to benefit from hydrotherapy for  selective removal of necrotic tissue, as well as to promote wound bed healing.  Wound Therapy - Functional Problem List: Incontinence, decreased mobility Factors Delaying/Impairing Wound Healing: Altered sensation;Incontinence;Infection - systemic/local;Immobility;Multiple medical problems Hydrotherapy Plan: Debridement;Patient/family education;Pulsatile lavage with suction;Dressing change Wound Therapy - Frequency: 6X / week Wound Therapy - Current Recommendations: OT;PT;Case manager/social work Wound Therapy - Follow Up Recommendations: Skilled nursing facility Wound Plan: See Above  Wound Therapy Goals- Improve the function of patient's integumentary system by progressing the wound(s) through the phases of wound healing (inflammation - proliferation - remodeling) by: Decrease Necrotic Tissue to: 20 Decrease Necrotic Tissue - Progress: Progressing toward goal Increase Granulation Tissue to: 80 Increase Granulation Tissue - Progress: Progressing toward goal Goals/treatment plan/discharge plan were made with and agreed upon by patient/family: Yes Time For Goal Achievement: 7 days Wound Therapy - Potential for Goals: Good  Goals will be updated until maximal potential achieved or discharge criteria met.  Discharge criteria: when goals achieved, discharge from hospital, MD decision/surgical intervention, no progress towards goals, refusal/missing three consecutive treatments without notification or medical reason.  GP     Thelma Comp 03/06/2016, 1:07 PM   Rolinda Roan, PT, DPT Acute Rehabilitation Services Pager: [phone]

## 2016-03-06 NOTE — Progress Notes (Signed)
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 Improving, responding to antibiotics see below   Subjective: 2/3 A/O 4. States has been wheelchair-bound for ~7 years. States did not know of significant ulceration of buttock and back until this hospitalization, therefore no home health care for her wounds. 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 -continue Zosyn. IV vancomycin has been discontinued. Consider narrowing antibiotics to oral Augmentin in the next one to 2 days. -Continue IV fluids.  Pneumonia bilateral, possible aspiration -Continue Zosyn. Vancomycin has been discontinued. -SLP as evaluated the patient and patient underwent modified barium swallow current diet recommended is dysphagia 3 diet with honey thick liquids.  -Wean O2, supportive care - Narrow IV Zosyn to oral Augmentin in the next one to 2 days to complete course of antibiotic treatment.   Pressure ulcers, sacral, present on admission, stage 2 to 3, multiple. -She is wheelchair/bed bound due to advanced multiple sclerosis -Wound RN consulted, multiple decubitus ulcers noted   Multiple sclerosis.  -Follow-up with neurology in [facility]    Protein calorie  malnutrition. BMI 16.  -Supplementation per nutrition recommendations.    Hypernatremia -Likely secondary to dehydration and volume depletion. Will change IV fluids to D5W and follow.  Hypokalemia -K-Dur 50 mEq      DVT prophylaxis: Lovenox Code Status: Full Family Communication: Son and Aunt present for discussion of plan of care Disposition Plan: SNF?   Consultants:  Wound care   Procedures/Significant Events:    VENTILATOR SETTINGS:    Cultures   Antimicrobials: Anti-infectives    Start     Stop   03/04/16 0000  piperacillin-tazobactam (ZOSYN) IVPB 3.375 g         03/03/16 0800  vancomycin (VANCOCIN) 500 mg in sodium chloride 0.9 % 100 mL IVPB  Status:  Discontinued     03/04/16 1132   03/03/16 0300  piperacillin-tazobactam (ZOSYN) IVPB 3.375 g  Status:  Discontinued     03/03/16 1634   03/02/16 1915  piperacillin-tazobactam (ZOSYN) IVPB 3.375 g     03/02/16 1952   03/02/16 1915  vancomycin (VANCOCIN) IVPB 1000 mg/200 mL premix     03/02/16 2052       Devices    LINES / TUBES:      Continuous Infusions: . dextrose 5 % 1,000 mL infusion 100 mL/hr at 03/05/16 2203     Objective: Vitals:   03/05/16 2345 03/06/16 0352 03/06/16 0620 03/06/16 1446  BP: 100/60  113/71 117/71  Pulse: 96  83 97  Resp: 12  14 18   Temp: 97.6 F (36.4 C)  97.8 F (36.6 C) 99.4 F (37.4 C)  TempSrc:   Oral   SpO2: 93%  97% 100%  Weight:  36.7 kg (81 lb)  Height:        Intake/Output Summary (Last 24 hours) at 03/06/16 1447 Last data filed at 03/06/16 1156  Gross per 24 hour  Intake             1220 ml  Output                0 ml  Net             1220 ml   Filed Weights   03/03/16 0452 03/05/16 0226 03/06/16 0352  Weight: 41.7 kg (91 lb 14.4 oz) 36.7 kg (81 lb) 36.7 kg (81 lb)    Examination:  General: A/O 4, cachectic, No acute respiratory distress Eyes: negative scleral hemorrhage, negative anisocoria, negative icterus ENT: Negative Runny  nose, negative gingival bleeding, Neck:  Negative scars, masses, torticollis, lymphadenopathy, JVD Lungs: Clear to auscultation bilaterally without wheezes or crackles Cardiovascular: Regular rate and rhythm without murmur gallop or rub normal S1 and S2 Abdomen: negative abdominal pain, nondistended, positive soft, bowel sounds, no rebound, no ascites, no appreciable mass Extremities: bilateral lower extremity atrophy  Skin: Patient with sacral and lumbar necrotic ulcers. See photo on H&P 1/30 Psychiatric:  Negative depression, negative anxiety, negative fatigue, negative mania  Central nervous system:  Cranial nerves II through XII intact, tongue/uvula midline, upper extremities muscle strength 5/5, sensation intact throughout, negative dysarthria, negative expressive aphasia, negative receptive aphasia.  .     Data Reviewed: Care during the described time interval was provided by me .  I have reviewed this patient's available data, including medical history, events of note, physical examination, and all test results as part of my evaluation. I have personally reviewed and interpreted all radiology studies.  CBC:  Recent Labs Lab 03/02/16 1832 03/03/16 0505 03/04/16 0433 03/05/16 0800 03/06/16 0602  WBC 13.2* 14.2* 6.1 2.9* 2.3*  NEUTROABS 11.8* 12.6* 5.0  --   --   HGB 11.7* 9.6* 9.0* 8.8* 8.1*  HCT 37.8 32.0* 30.2* 29.4* 26.8*  MCV 92.0 91.7 92.1 93.3 93.4  PLT 526* 419* 424* 360 [id number]   Basic Metabolic Panel:  Recent Labs Lab 03/02/16 1832 03/03/16 0505 03/04/16 0433 03/05/16 0800 03/06/16 0602  NA 141 146* 149* 158* 150*  K 3.7 3.0* 2.6* 3.5 2.8*  CL 97* 104 110 121* 113*  CO2 30 31 30 27 29   GLUCOSE 163* 135* 120* 110* 119*  BUN 12 9 12 13 7   CREATININE 0.77 0.49 0.77 0.87 0.79  CALCIUM 8.5* 7.9* 7.9* 8.2* 7.9*  MG  --   --   --  2.3  --    GFR: Estimated Creatinine Clearance: 54.7 mL/min (by C-G formula based on SCr of 0.79 mg/dL). Liver Function  Tests:  Recent Labs Lab 03/02/16 1832 03/03/16 0505  AST 113* 84*  ALT 55* 42  ALKPHOS 118 90  BILITOT 1.7* 0.5  PROT 6.2* 5.2*  ALBUMIN 2.2* 1.7*   No results for input(s): LIPASE, AMYLASE in the last 168 hours. No results for input(s): AMMONIA in the last 168 hours. Coagulation Profile:  Recent Labs Lab 03/02/16 1832  INR 1.28   Cardiac Enzymes:  Recent Labs Lab 03/02/16 2300  TROPONINI <0.03   BNP (last 3 results) No results for input(s): PROBNP in the last 8760 hours. HbA1C: No results for input(s): HGBA1C in the last 72 hours. CBG:  Recent Labs Lab 03/03/16 0806 03/04/16 0810 03/05/16 0813 03/06/16 0806  GLUCAP 104* 110* 102* 98   Lipid Profile: No results for input(s): CHOL,  HDL, LDLCALC, TRIG, CHOLHDL, LDLDIRECT in the last 72 hours. Thyroid Function Tests: No results for input(s): TSH, T4TOTAL, FREET4, T3FREE, THYROIDAB in the last 72 hours. Anemia Panel: No results for input(s): VITAMINB12, FOLATE, FERRITIN, TIBC, IRON, RETICCTPCT in the last 72 hours. Sepsis Labs:  Recent Labs Lab 03/02/16 1843 03/02/16 2024 03/02/16 2252  PROCALCITON  --   --  2.25  LATICACIDVEN 3.45* 1.3 1.1    Recent Results (from the past 240 hour(s))  Culture, blood (Routine x 2)     Status: None (Preliminary result)   Collection Time: 03/02/16  6:32 PM  Result Value Ref Range Status   Specimen Description BLOOD RIGHT FOREARM  Final   Special Requests BOTTLES DRAWN AEROBIC AND ANAEROBIC 5CC  Final   Culture NO GROWTH 4 DAYS  Final   Report Status PENDING  Incomplete  Culture, blood (Routine x 2)     Status: None (Preliminary result)   Collection Time: 03/02/16  6:39 PM  Result Value Ref Range Status   Specimen Description BLOOD RIGHT HAND  Final   Special Requests BOTTLES DRAWN AEROBIC AND ANAEROBIC 5CC  Final   Culture NO GROWTH 4 DAYS  Final   Report Status PENDING  Incomplete  Urine culture     Status: Abnormal   Collection Time: 03/02/16  7:29 PM  Result  Value Ref Range Status   Specimen Description URINE, CATHETERIZED  Final   Special Requests NONE  Final   Culture >=100,000 COLONIES/mL ESCHERICHIA COLI (A)  Final   Report Status 03/05/2016 FINAL  Final   Organism ID, Bacteria ESCHERICHIA COLI (A)  Final      Susceptibility   Escherichia coli - MIC*    AMPICILLIN <=2 SENSITIVE Sensitive     CEFAZOLIN <=4 SENSITIVE Sensitive     CEFTRIAXONE <=1 SENSITIVE Sensitive     CIPROFLOXACIN <=0.25 SENSITIVE Sensitive     GENTAMICIN <=1 SENSITIVE Sensitive     IMIPENEM <=0.25 SENSITIVE Sensitive     NITROFURANTOIN 32 SENSITIVE Sensitive     TRIMETH/SULFA <=20 SENSITIVE Sensitive     AMPICILLIN/SULBACTAM <=2 SENSITIVE Sensitive     PIP/TAZO <=4 SENSITIVE Sensitive     Extended ESBL NEGATIVE Sensitive     * >=100,000 COLONIES/mL ESCHERICHIA COLI         Radiology Studies: No results found.      Scheduled Meds: . collagenase   Topical Daily  . enoxaparin (LOVENOX) injection  20 mg Subcutaneous Q24H  . feeding supplement (ENSURE ENLIVE)  237 mL Oral BID BM  . piperacillin-tazobactam (ZOSYN)  IV  3.375 g Intravenous Q8H   Continuous Infusions: . dextrose 5 % 1,000 mL infusion 100 mL/hr at 03/05/16 2203     LOS: 4 days    Time spent:40 min    Jezabel Lecker, Geraldo Docker, MD [facility] Pager [phone]  If 7PM-7AM, please contact night-coverage [email or web address] Password [facility] 03/06/2016, 2:47 PM

## 2016-03-07 ENCOUNTER — Inpatient Hospital Stay (HOSPITAL_COMMUNITY): Payer: Medicare Other

## 2016-03-07 DIAGNOSIS — E43 Unspecified severe protein-calorie malnutrition: Secondary | ICD-10-CM

## 2016-03-07 DIAGNOSIS — G35 Multiple sclerosis: Secondary | ICD-10-CM

## 2016-03-07 DIAGNOSIS — J690 Pneumonitis due to inhalation of food and vomit: Secondary | ICD-10-CM

## 2016-03-07 DIAGNOSIS — L89150 Pressure ulcer of sacral region, unstageable: Secondary | ICD-10-CM

## 2016-03-07 DIAGNOSIS — A419 Sepsis, unspecified organism: Secondary | ICD-10-CM

## 2016-03-07 DIAGNOSIS — N390 Urinary tract infection, site not specified: Secondary | ICD-10-CM

## 2016-03-07 DIAGNOSIS — L89153 Pressure ulcer of sacral region, stage 3: Secondary | ICD-10-CM

## 2016-03-07 LAB — CBC
HCT: 31 % — ABNORMAL LOW (ref 36.0–46.0)
Hemoglobin: 9.3 g/dL — ABNORMAL LOW (ref 12.0–15.0)
MCH: 27.7 pg (ref 26.0–34.0)
MCHC: 30 g/dL (ref 30.0–36.0)
MCV: 92.3 fL (ref 78.0–100.0)
PLATELETS: 299 10*3/uL (ref 150–400)
RBC: 3.36 MIL/uL — AB (ref 3.87–5.11)
RDW: 14 % (ref 11.5–15.5)
WBC: 1.8 10*3/uL — AB (ref 4.0–10.5)

## 2016-03-07 LAB — RESPIRATORY PANEL BY PCR

## 2016-03-07 LAB — CBC WITH DIFFERENTIAL/PLATELET
BASOS ABS: 0 10*3/uL (ref 0.0–0.1)
Basophils Relative: 0 %
EOS ABS: 0 10*3/uL (ref 0.0–0.7)
Eosinophils Relative: 1 %
HCT: 32.8 % — ABNORMAL LOW (ref 36.0–46.0)
Hemoglobin: 9.6 g/dL — ABNORMAL LOW (ref 12.0–15.0)
LYMPHS ABS: 0.8 10*3/uL (ref 0.7–4.0)
Lymphocytes Relative: 45 %
MCH: 27.1 pg (ref 26.0–34.0)
MCHC: 29.3 g/dL — AB (ref 30.0–36.0)
MCV: 92.7 fL (ref 78.0–100.0)
MONOS PCT: 1 %
Monocytes Absolute: 0 10*3/uL — ABNORMAL LOW (ref 0.1–1.0)
NEUTROS ABS: 1 10*3/uL — AB (ref 1.7–7.7)
Neutrophils Relative %: 53 %
PLATELETS: 322 10*3/uL (ref 150–400)
RBC: 3.54 MIL/uL — AB (ref 3.87–5.11)
RDW: 13.8 % (ref 11.5–15.5)
WBC: 1.8 10*3/uL — AB (ref 4.0–10.5)

## 2016-03-07 LAB — BASIC METABOLIC PANEL
ANION GAP: 12 (ref 5–15)
BUN: 9 mg/dL (ref 6–20)
CALCIUM: 7.4 mg/dL — AB (ref 8.9–10.3)
CO2: 28 mmol/L (ref 22–32)
CREATININE: 0.99 mg/dL (ref 0.44–1.00)
Chloride: 103 mmol/L (ref 101–111)
Glucose, Bld: 112 mg/dL — ABNORMAL HIGH (ref 65–99)
Potassium: 3.2 mmol/L — ABNORMAL LOW (ref 3.5–5.1)
Sodium: 143 mmol/L (ref 135–145)

## 2016-03-07 LAB — MAGNESIUM: Magnesium: 2 mg/dL (ref 1.7–2.4)

## 2016-03-07 LAB — LACTIC ACID, PLASMA: LACTIC ACID, VENOUS: 2.5 mmol/L — AB (ref 0.5–1.9)

## 2016-03-07 LAB — CULTURE, BLOOD (ROUTINE X 2)
Culture: NO GROWTH
Culture: NO GROWTH

## 2016-03-07 LAB — STREP PNEUMONIAE URINARY ANTIGEN: STREP PNEUMO URINARY ANTIGEN: NEGATIVE

## 2016-03-07 LAB — GLUCOSE, CAPILLARY: GLUCOSE-CAPILLARY: 115 mg/dL — AB (ref 65–99)

## 2016-03-07 MED ORDER — DEXTROSE 5 % IV SOLN
1.0000 g | Freq: Three times a day (TID) | INTRAVENOUS | Status: DC
  Administered 2016-03-07 – 2016-03-08 (×4): 1 g via INTRAVENOUS
  Filled 2016-03-07 (×5): qty 1

## 2016-03-07 MED ORDER — VANCOMYCIN HCL IN DEXTROSE 1-5 GM/200ML-% IV SOLN
1000.0000 mg | INTRAVENOUS | Status: DC
  Administered 2016-03-07: 1000 mg via INTRAVENOUS
  Filled 2016-03-07 (×2): qty 200

## 2016-03-07 MED ORDER — TBO-FILGRASTIM 300 MCG/0.5ML ~~LOC~~ SOSY
300.0000 ug | PREFILLED_SYRINGE | SUBCUTANEOUS | Status: DC
  Administered 2016-03-07: 300 ug via SUBCUTANEOUS
  Filled 2016-03-07 (×2): qty 0.5

## 2016-03-07 MED ORDER — SODIUM CHLORIDE 0.9 % IV BOLUS (SEPSIS)
1000.0000 mL | Freq: Once | INTRAVENOUS | Status: AC
  Administered 2016-03-07: 1000 mL via INTRAVENOUS

## 2016-03-07 MED ORDER — GADOBENATE DIMEGLUMINE 529 MG/ML IV SOLN
8.0000 mL | Freq: Once | INTRAVENOUS | Status: AC | PRN
  Administered 2016-03-07: 8 mL via INTRAVENOUS

## 2016-03-07 MED ORDER — POTASSIUM CHLORIDE CRYS ER 20 MEQ PO TBCR
50.0000 meq | EXTENDED_RELEASE_TABLET | Freq: Once | ORAL | Status: AC
  Administered 2016-03-07: 50 meq via ORAL
  Filled 2016-03-07: qty 1

## 2016-03-07 NOTE — Progress Notes (Signed)
CSW spoke with patient regarding discharge plans. Patient is agreeable to SNF and would like to go to [facility]. [facility] did give patient bed offers and is able to accept patient. CSW will follow up closer to patients discharge.   Kingsley Spittle, LCSWA Clinical Social Worker [phone]

## 2016-03-07 NOTE — Progress Notes (Signed)
PROGRESS NOTE    Deanna Baldwin  [id number] DOB: 22-Apr-1975 DOA: 03/02/2016 PCP: [name], MD     Brief Narrative:  41 yo female, PMHx Advanced Multiple Sclerosis, with spastic paraplegia on Rituxan (last infusion 01/20/2016). wheelchair-bound ~7 years at baseline   Admitted with encephalopathy, fevers, sepsis noted to have abnormal UA and bibasilar pulmonary infiltrates. Improving, responding to antibiotics see below   Subjective: 2/4  MAXIMUM TEMPERATURE last 24 hours 38.8 C,  A/O 4. Negative SOB, negative CP. Complaining chills/shakes.     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 Johnnye Sima will change Zosyn to cefepime and restart vancomycin. -PCXR pending -Lactic acidosis bolus 1 L normal saline  Pneumonia bilateral, possible aspiration -SLP as evaluated the patient and patient underwent modified barium swallow current diet recommended is dysphagia 3 diet with honey thick liquids.  -Titrate O2 to maintain SPO2> 93% -See sepsis   Pressure ulcers, sacral, present on admission, stage 2 to 3, multiple. -She is wheelchair/bed bound due to advanced multiple sclerosis -Wound RN consulted, multiple decubitus ulcers noted -Obtain MRI L-spine/pelvic and sacrum  for osteomyelitis/abscess  Leukopenia -Patient placed on neutropenic precautions. -Neupogen 6 g 44.5 kg= 267 g daily (Roundup to 300 g) for 3 doses. If no improvement consult Hematology   Multiple  sclerosis.  -Seen at [facility] for treatment - Rituxan (last infusion 01/20/2016)   Protein calorie malnutrition. BMI 16.  -Supplementation per nutrition recommendations. -Calorie count in progress. Patient may need to consider PEG tube to meet nutritional requirements    Hypernatremia -Likely secondary to dehydration and volume depletion.  -D5W at 100 ml/hr.  Hypokalemia -K-Dur 50 mEq   Goals of care -PALLIATIVE CARE: Please palate care consult considering patient's severe MS, malnutrition and sepsis at considerable risk for poor outcome. Discuss short-term vs long-term goals of care, and code status    DVT prophylaxis: Lovenox Code Status: Full Family Communication: none Disposition Plan: SNF?   Consultants:  Wound care   Procedures/Significant Events:  1/30 PCXR: Bilateral patchy opacifications. Pneumonia    VENTILATOR SETTINGS:    Cultures 1/30 blood 2 negative 1/30 urine positive Escherichia coli 1/31 influenza A/B negative 2/4 blood pending 2/4 urine pending 2/4 sputum pending    Antimicrobials: Anti-infectives    Start     Stop   03/04/16 0000  piperacillin-tazobactam (ZOSYN) IVPB 3.375 g         03/03/16 0800  vancomycin (VANCOCIN) 500 mg in sodium chloride 0.9 % 100 mL IVPB  Status:  Discontinued     03/04/16 1132   03/03/16 0300  piperacillin-tazobactam (ZOSYN) IVPB 3.375 g  Status:  Discontinued     03/03/16 1634   03/02/16 1915  piperacillin-tazobactam (ZOSYN) IVPB 3.375 g     03/02/16 1952   03/02/16 1915  vancomycin (VANCOCIN) IVPB 1000 mg/200 mL premix     03/02/16 2052       Devices    LINES / TUBES:      Continuous Infusions: . dextrose 5 % 1,000 mL infusion  100 mL/hr at 03/07/16 0605     Objective: Vitals:   03/06/16 1446 03/06/16 2117 03/07/16 0500 03/07/16 0510  BP: 117/71 116/76  101/60  Pulse: 97 92  91  Resp: 18 18  18   Temp: 99.4 F (37.4 C) 100 F (37.8 C)  98.4 F (36.9 C)  TempSrc:    Oral  SpO2:  100% 98%  97%  Weight:   44.5 kg (98 lb)   Height:        Intake/Output Summary (Last 24 hours) at 03/07/16 0834 Last data filed at 03/07/16 [id number]  Gross per 24 hour  Intake          2183.33 ml  Output                0 ml  Net          2183.33 ml   Filed Weights   03/05/16 0226 03/06/16 0352 03/07/16 0500  Weight: 36.7 kg (81 lb) 36.7 kg (81 lb) 44.5 kg (98 lb)    Examination:  General: A/O 4, cachectic, No acute respiratory distress Eyes: negative scleral hemorrhage, negative anisocoria, negative icterus ENT: Negative Runny nose, negative gingival bleeding, Neck:  Negative scars, masses, torticollis, lymphadenopathy, JVD Lungs: Clear to auscultation bilaterally without wheezes or crackles Cardiovascular: Regular rate and rhythm without murmur gallop or rub normal S1 and S2 Abdomen: negative abdominal pain, nondistended, positive soft, bowel sounds, no rebound, no ascites, no appreciable mass Extremities: bilateral lower extremity atrophy  Skin: Patient with sacral and lumbar necrotic ulcers. See photo on H&P 1/30 Psychiatric:  Negative depression, negative anxiety, negative fatigue, negative mania  Central nervous system:  Cranial nerves II through XII intact, tongue/uvula midline, upper extremities muscle strength 5/5, sensation intact throughout, negative dysarthria, negative expressive aphasia, negative receptive aphasia.  .     Data Reviewed: Care during the described time interval was provided by me .  I have reviewed this patient's available data, including medical history, events of note, physical examination, and all test results as part of my evaluation. I have personally reviewed and interpreted all radiology studies.  CBC:  Recent Labs Lab 03/02/16 1832 03/03/16 0505 03/04/16 0433 03/05/16 0800 03/06/16 0602  WBC 13.2* 14.2* 6.1 2.9* 2.3*  NEUTROABS 11.8* 12.6* 5.0  --   --   HGB 11.7* 9.6* 9.0* 8.8* 8.1*  HCT 37.8 32.0* 30.2* 29.4* 26.8*  MCV 92.0 91.7  92.1 93.3 93.4  PLT 526* 419* 424* 360 [id number]   Basic Metabolic Panel:  Recent Labs Lab 03/02/16 1832 03/03/16 0505 03/04/16 0433 03/05/16 0800 03/06/16 0602  NA 141 146* 149* 158* 150*  K 3.7 3.0* 2.6* 3.5 2.8*  CL 97* 104 110 121* 113*  CO2 30 31 30 27 29   GLUCOSE 163* 135* 120* 110* 119*  BUN 12 9 12 13 7   CREATININE 0.77 0.49 0.77 0.87 0.79  CALCIUM 8.5* 7.9* 7.9* 8.2* 7.9*  MG  --   --   --  2.3  --    GFR: Estimated Creatinine Clearance: 66.3 mL/min (by C-G formula based on SCr of 0.79 mg/dL). Liver Function Tests:  Recent Labs Lab 03/02/16 1832 03/03/16 0505  AST 113* 84*  ALT 55* 42  ALKPHOS 118 90  BILITOT 1.7* 0.5  PROT 6.2* 5.2*  ALBUMIN 2.2* 1.7*   No results for input(s): LIPASE, AMYLASE in the last 168 hours. No results for input(s): AMMONIA in the last 168 hours. Coagulation Profile:  Recent Labs Lab 03/02/16 1832  INR 1.28  Cardiac Enzymes:  Recent Labs Lab 03/02/16 2300  TROPONINI <0.03   BNP (last 3 results) No results for input(s): PROBNP in the last 8760 hours. HbA1C: No results for input(s): HGBA1C in the last 72 hours. CBG:  Recent Labs Lab 03/03/16 0806 03/04/16 0810 03/05/16 0813 03/06/16 0806 03/07/16 0802  GLUCAP 104* 110* 102* 98 115*   Lipid Profile: No results for input(s): CHOL, HDL, LDLCALC, TRIG, CHOLHDL, LDLDIRECT in the last 72 hours. Thyroid Function Tests: No results for input(s): TSH, T4TOTAL, FREET4, T3FREE, THYROIDAB in the last 72 hours. Anemia Panel: No results for input(s): VITAMINB12, FOLATE, FERRITIN, TIBC, IRON, RETICCTPCT in the last 72 hours. Sepsis Labs:  Recent Labs Lab 03/02/16 1843 03/02/16 2024 03/02/16 2252  PROCALCITON  --   --  2.25  LATICACIDVEN 3.45* 1.3 1.1    Recent Results (from the past 240 hour(s))  Culture, blood (Routine x 2)     Status: None (Preliminary result)   Collection Time: 03/02/16  6:32 PM  Result Value Ref Range Status   Specimen Description BLOOD RIGHT  FOREARM  Final   Special Requests BOTTLES DRAWN AEROBIC AND ANAEROBIC 5CC  Final   Culture NO GROWTH 4 DAYS  Final   Report Status PENDING  Incomplete  Culture, blood (Routine x 2)     Status: None (Preliminary result)   Collection Time: 03/02/16  6:39 PM  Result Value Ref Range Status   Specimen Description BLOOD RIGHT HAND  Final   Special Requests BOTTLES DRAWN AEROBIC AND ANAEROBIC 5CC  Final   Culture NO GROWTH 4 DAYS  Final   Report Status PENDING  Incomplete  Urine culture     Status: Abnormal   Collection Time: 03/02/16  7:29 PM  Result Value Ref Range Status   Specimen Description URINE, CATHETERIZED  Final   Special Requests NONE  Final   Culture >=100,000 COLONIES/mL ESCHERICHIA COLI (A)  Final   Report Status 03/05/2016 FINAL  Final   Organism ID, Bacteria ESCHERICHIA COLI (A)  Final      Susceptibility   Escherichia coli - MIC*    AMPICILLIN <=2 SENSITIVE Sensitive     CEFAZOLIN <=4 SENSITIVE Sensitive     CEFTRIAXONE <=1 SENSITIVE Sensitive     CIPROFLOXACIN <=0.25 SENSITIVE Sensitive     GENTAMICIN <=1 SENSITIVE Sensitive     IMIPENEM <=0.25 SENSITIVE Sensitive     NITROFURANTOIN 32 SENSITIVE Sensitive     TRIMETH/SULFA <=20 SENSITIVE Sensitive     AMPICILLIN/SULBACTAM <=2 SENSITIVE Sensitive     PIP/TAZO <=4 SENSITIVE Sensitive     Extended ESBL NEGATIVE Sensitive     * >=100,000 COLONIES/mL ESCHERICHIA COLI         Radiology Studies: No results found.      Scheduled Meds: . collagenase   Topical Daily  . enoxaparin (LOVENOX) injection  20 mg Subcutaneous Q24H  . feeding supplement (ENSURE ENLIVE)  237 mL Oral BID BM  . piperacillin-tazobactam (ZOSYN)  IV  3.375 g Intravenous Q8H   Continuous Infusions: . dextrose 5 % 1,000 mL infusion 100 mL/hr at 03/07/16 0605     LOS: 5 days    Time spent:40 min    Lanaiya Lantry, Geraldo Docker, MD [facility] Pager [phone]  If 7PM-7AM, please contact night-coverage [email or web address] Password  [facility] 03/07/2016, 8:34 AM

## 2016-03-07 NOTE — Progress Notes (Signed)
Received critical lactic acid level of 2.5 from the lab. Notified Dr. Sherral Hammers via [facility] text page.

## 2016-03-07 NOTE — Progress Notes (Addendum)
Pharmacy Antibiotic Note  Deanna Baldwin is a 41 y.o. female with PMHx Advanced Multiple Sclerosis, with spastic paraplegia , admitted on 03/02/2016 with sepsis.  Today has critical lactic acid level of 2.5.  Pharmacy has been consulted for vancomycin for Pneumonia. Renal function wnl, SCr 0.99, CrCl ~ 50 ml/min WBC 1.8K , WBC was 13.2k on 03/02/16.  Tc 101.8, Lactate 2.5 critical She has been receiving IV Zosyn since 1/30 for sepsis due to E. Coli UTI. Zosyn dc'd today and MD changed to Cefepime 1 g IV q8h + vancomycin. Blood Cx's from 1/30 were negative.  Today sputum culture sent.   Vancomycin trough goal 15-20 mcg/ml  Plan: Vancomycin 1000 mg IV q24h Cefepime as per MD's orders Follow renal function, cultures, LOT, level if needed  Height: 5\' 9"  (175.3 cm) Weight: 98 lb (44.5 kg) IBW/kg (Calculated) : 66.2  Temp (24hrs), Avg:100.3 F (37.9 C), Min:98.4 F (36.9 C), Max:101.8 F (38.8 C)   Recent Labs Lab 03/02/16 1843 03/02/16 2024 03/02/16 2252 03/03/16 0505 03/04/16 0433 03/04/16 0906 03/05/16 0800 03/06/16 0602 03/07/16 0840 03/07/16 1210  WBC  --   --   --  14.2* 6.1  --  2.9* 2.3* 1.8* 1.8*  CREATININE  --   --   --  0.49 0.77  --  0.87 0.79 0.99  --   LATICACIDVEN 3.45* 1.3 1.1  --   --   --   --   --   --  2.5*  VANCOTROUGH  --   --   --   --   --  7*  --   --   --   --     Estimated Creatinine Clearance: 53.6 mL/min (by C-G formula based on SCr of 0.99 mg/dL).    No Known Allergies  Antimicrobials this admission: 1/30 Vancomycin >>1/31 1/30 Zosyn >>  Dose adjustments this admission: n/a  Microbiology results: 1/30 blood >>neg 1/30 urine >>E. Coli:  Pan sensitive 2/4 Sputum>> 2/4 Resp panel>>  Thank you for allowing pharmacy to be a part of this patient's care.  [name], RPh Clinical Pharmacist Pager: [phone] 03/07/2016 2:26 PM

## 2016-03-08 DIAGNOSIS — M60009 Infective myositis, unspecified site: Secondary | ICD-10-CM

## 2016-03-08 DIAGNOSIS — Z515 Encounter for palliative care: Secondary | ICD-10-CM

## 2016-03-08 DIAGNOSIS — J690 Pneumonitis due to inhalation of food and vomit: Secondary | ICD-10-CM

## 2016-03-08 DIAGNOSIS — M609 Myositis, unspecified: Secondary | ICD-10-CM

## 2016-03-08 DIAGNOSIS — L89153 Pressure ulcer of sacral region, stage 3: Secondary | ICD-10-CM

## 2016-03-08 LAB — HIV ANTIBODY (ROUTINE TESTING W REFLEX): HIV SCREEN 4TH GENERATION: NONREACTIVE

## 2016-03-08 LAB — CBC WITH DIFFERENTIAL/PLATELET
Basophils Absolute: 0 10*3/uL (ref 0.0–0.1)
Basophils Relative: 0 %
EOS ABS: 0.1 10*3/uL (ref 0.0–0.7)
EOS PCT: 2 %
HCT: 30.8 % — ABNORMAL LOW (ref 36.0–46.0)
HEMOGLOBIN: 9.1 g/dL — AB (ref 12.0–15.0)
LYMPHS ABS: 0.5 10*3/uL — AB (ref 0.7–4.0)
Lymphocytes Relative: 15 %
MCH: 27.3 pg (ref 26.0–34.0)
MCHC: 29.5 g/dL — AB (ref 30.0–36.0)
MCV: 92.5 fL (ref 78.0–100.0)
MONOS PCT: 5 %
Monocytes Absolute: 0.2 10*3/uL (ref 0.1–1.0)
Neutro Abs: 2.7 10*3/uL (ref 1.7–7.7)
Neutrophils Relative %: 78 %
Platelets: 295 10*3/uL (ref 150–400)
RBC: 3.33 MIL/uL — ABNORMAL LOW (ref 3.87–5.11)
RDW: 14 % (ref 11.5–15.5)
WBC: 3.5 10*3/uL — ABNORMAL LOW (ref 4.0–10.5)

## 2016-03-08 LAB — BASIC METABOLIC PANEL
Anion gap: 8 (ref 5–15)
BUN: 9 mg/dL (ref 6–20)
CALCIUM: 8.1 mg/dL — AB (ref 8.9–10.3)
CO2: 31 mmol/L (ref 22–32)
CREATININE: 0.99 mg/dL (ref 0.44–1.00)
Chloride: 112 mmol/L — ABNORMAL HIGH (ref 101–111)
GFR calc Af Amer: >60 mL/min (ref >60)
GFR calc non Af Amer: >60 mL/min (ref >60)
GLUCOSE: 94 mg/dL (ref 65–99)
Potassium: 4.2 mmol/L (ref 3.5–5.1)
Sodium: 151 mmol/L — ABNORMAL HIGH (ref 135–145)

## 2016-03-08 LAB — URINE CULTURE: CULTURE: NO GROWTH

## 2016-03-08 LAB — MAGNESIUM: Magnesium: 1.8 mg/dL (ref 1.7–2.4)

## 2016-03-08 LAB — GLUCOSE, CAPILLARY: GLUCOSE-CAPILLARY: 115 mg/dL — AB (ref 65–99)

## 2016-03-08 LAB — LACTIC ACID, PLASMA: Lactic Acid, Venous: 2.7 mmol/L (ref 0.5–1.9)

## 2016-03-08 MED ORDER — VANCOMYCIN HCL IN DEXTROSE 1-5 GM/200ML-% IV SOLN
1000.0000 mg | Freq: Two times a day (BID) | INTRAVENOUS | Status: DC
  Administered 2016-03-08: 1000 mg via INTRAVENOUS
  Filled 2016-03-08: qty 200

## 2016-03-08 MED ORDER — VANCOMYCIN HCL IN DEXTROSE 1-5 GM/200ML-% IV SOLN
1000.0000 mg | Freq: Two times a day (BID) | INTRAVENOUS | Status: DC
  Administered 2016-03-09: 1000 mg via INTRAVENOUS
  Filled 2016-03-08: qty 200

## 2016-03-08 MED ORDER — WHITE PETROLATUM GEL
Status: AC
  Administered 2016-03-08: 18:00:00
  Filled 2016-03-08: qty 1

## 2016-03-08 NOTE — Progress Notes (Addendum)
PROGRESS NOTE    Deanna Baldwin  [id number] DOB: [birth year] DOA: 03/02/2016 PCP: [name], MD     Brief Narrative:  41 yo female, with multiple sclerosis presents with altered mentation and fever. Recent respiratory illness. Found by patient's family lethargic with temperature 104. On the initial evaluation found to be septic. Positive urine infection and bibasilar pulmonary infiltrates. Developed worsening sepsis cw leukopenia and elevated lactic acid, antibiotics have been up-escalated. MRI pelvis with multiple soft tissue collections, with myositis right gluteal region, suspected pyomyositis, largest collection 2 cm.     Assessment & Plan:   Principal Problem:   Severe sepsis ([place]) Active Problems:   Spastic 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 ([place])   Community acquired pneumonia   UTI (urinary tract infection)   Abnormal LFTs   Buttock wound   Protein calorie malnutrition (H[facility])   Pressure injury of skin   Hypokalemia   Hypernatremia   Sepsis secondary to UTI ([place])   Aspiration pneumonia of left lower lobe due to vomit ([place])   Sacral decubitus ulcer, stage III (HCC)   Multiple sclerosis (HCC)   Protein-calorie malnutrition, severe ([place])   1. Sepsis due to urine infection, present on admission.  Urine culture positive for e coli pan sensitive, will continue antibiotic therapy with cefepime and vancomycin. Patient has remained afebrile, cell count up to 3.5.    2. Pneumonia bilateral, possible aspiration, present on admission. Continue aspiration precautions, swallow evaluation with recommendation for dysphagia 3, mechanical soft with honey thick liquids. Will continue oxymetry monitoring and supplemental 02 per [place] to target 02 sat above 92%.  3. Pressure ulcers, sacral, present on admission, stage 2 to 3, multiple. Noted pelvic MRI findings, with inflammatory changes and collections, case discussed with IR over the phone. Very small targets and technically  difficult to drain, unclear if true infections process, will continue conservative care with broad spectrum antibiotic therapy.  I.D consulted for further recommendations.   4. Multiple sclerosis. Patient has loss follow up for MS at tertiary care center. Family requesting re-evaluation for neurology on this admission.   5. Protein calorie malnutrition. BMI 16. Very deconditioned, palliative care team consulted.   6. Hypernatremia. Will continue hydration with d5W at 100 cc per hour and follow renal panel in am, patient with very poor oral intake.    DVT prophylaxis: enoxaparin Code Status: full  Family Communication: No family at the bedside  Disposition Plan: home   Consultants:   Wound care  Procedures:    Antimicrobials:   Vancomycin  Zosyn    Subjective: Patient with no dyspnea but feeling weak. No chest pain, no nausea or vomiting.   Objective: Vitals:   03/07/16 1314 03/07/16 2207 03/08/16 0500 03/08/16 0507  BP: 102/74 106/67  106/71  Pulse: (!) 105 94  99  Resp: (!) 28 18  18   Temp: (!) 101.8 F (38.8 C) 98.2 F (36.8 C)  99.1 F (37.3 C)  TempSrc:  Oral  Oral  SpO2: 93% 100%  100%  Weight:   43.1 kg (95 lb)   Height:        Intake/Output Summary (Last 24 hours) at 03/08/16 1254 Last data filed at 03/08/16 [id number]  Gross per 24 hour  Intake          1418.34 ml  Output             5750 ml  Net         -4331.66 ml   Filed Weights   03/06/16 0352  03/07/16 0500 03/08/16 0500  Weight: 36.7 kg (81 lb) 44.5 kg (98 lb) 43.1 kg (95 lb)    Examination:  General exam: deconditioned and ill looking appearing E ENT: positive pallor but no icterus.  Respiratory system:. Respiratory effort normal. Decreased inspiratory effort, positive scattered rhonchi bilateral with no rales or wheezing.  Cardiovascular system: S1 & S2 heard, RRR. No JVD, murmurs, rubs, gallops or clicks. No pedal edema. Gastrointestinal system: Abdomen is nondistended, soft and  nontender. No organomegaly or masses felt. Normal bowel sounds heard. Central nervous system: Alert and oriented. No focal neurological deficits. Extremities: Symmetric 5 x 5 power. Skin: Multiple ulcers on the sacrum stage 2 and 3.     Data Reviewed: I have personally reviewed following labs and imaging studies  CBC:  Recent Labs Lab 03/02/16 1832 03/03/16 0505 03/04/16 0433 03/05/16 0800 03/06/16 0602 03/07/16 0840 03/07/16 1210 03/08/16 0526  WBC 13.2* 14.2* 6.1 2.9* 2.3* 1.8* 1.8* 3.5*  NEUTROABS 11.8* 12.6* 5.0  --   --   --  1.0* 2.7  HGB 11.7* 9.6* 9.0* 8.8* 8.1* 9.3* 9.6* 9.1*  HCT 37.8 32.0* 30.2* 29.4* 26.8* 31.0* 32.8* 30.8*  MCV 92.0 91.7 92.1 93.3 93.4 92.3 92.7 92.5  PLT 526* 419* 424* 360 322 299 322 [id number]   Basic Metabolic Panel:  Recent Labs Lab 03/04/16 0433 03/05/16 0800 03/06/16 0602 03/07/16 0840 03/08/16 0526  NA 149* 158* 150* 143 151*  K 2.6* 3.5 2.8* 3.2* 4.2  CL 110 121* 113* 103 112*  CO2 30 27 29 28 31   GLUCOSE 120* 110* 119* 112* 94  BUN 12 13 7 9 9   CREATININE 0.77 0.87 0.79 0.99 0.99  CALCIUM 7.9* 8.2* 7.9* 7.4* 8.1*  MG  --  2.3  --  2.0 1.8   GFR: Estimated Creatinine Clearance: 51.9 mL/min (by C-G formula based on SCr of 0.99 mg/dL). Liver Function Tests:  Recent Labs Lab 03/02/16 1832 03/03/16 0505  AST 113* 84*  ALT 55* 42  ALKPHOS 118 90  BILITOT 1.7* 0.5  PROT 6.2* 5.2*  ALBUMIN 2.2* 1.7*   No results for input(s): LIPASE, AMYLASE in the last 168 hours. No results for input(s): AMMONIA in the last 168 hours. Coagulation Profile:  Recent Labs Lab 03/02/16 1832  INR 1.28   Cardiac Enzymes:  Recent Labs Lab 03/02/16 2300  TROPONINI <0.03   BNP (last 3 results) No results for input(s): PROBNP in the last 8760 hours. HbA1C: No results for input(s): HGBA1C in the last 72 hours. CBG:  Recent Labs Lab 03/04/16 0810 03/05/16 0813 03/06/16 0806 03/07/16 0802 03/08/16 0800  GLUCAP 110* 102* 98 115*  115*   Lipid Profile: No results for input(s): CHOL, HDL, LDLCALC, TRIG, CHOLHDL, LDLDIRECT in the last 72 hours. Thyroid Function Tests: No results for input(s): TSH, T4TOTAL, FREET4, T3FREE, THYROIDAB in the last 72 hours. Anemia Panel: No results for input(s): VITAMINB12, FOLATE, FERRITIN, TIBC, IRON, RETICCTPCT in the last 72 hours. Sepsis Labs:  Recent Labs Lab 03/02/16 1843 03/02/16 2024 03/02/16 2252 03/07/16 1210  PROCALCITON  --   --  2.25  --   LATICACIDVEN 3.45* 1.3 1.1 2.5*    Recent Results (from the past 240 hour(s))  Culture, blood (Routine x 2)     Status: None   Collection Time: 03/02/16  6:32 PM  Result Value Ref Range Status   Specimen Description BLOOD RIGHT FOREARM  Final   Special Requests BOTTLES DRAWN AEROBIC AND ANAEROBIC 5CC  Final   Culture  NO GROWTH 5 DAYS  Final   Report Status 03/07/2016 FINAL  Final  Culture, blood (Routine x 2)     Status: None   Collection Time: 03/02/16  6:39 PM  Result Value Ref Range Status   Specimen Description BLOOD RIGHT HAND  Final   Special Requests BOTTLES DRAWN AEROBIC AND ANAEROBIC 5CC  Final   Culture NO GROWTH 5 DAYS  Final   Report Status 03/07/2016 FINAL  Final  Urine culture     Status: Abnormal   Collection Time: 03/02/16  7:29 PM  Result Value Ref Range Status   Specimen Description URINE, CATHETERIZED  Final   Special Requests NONE  Final   Culture >=100,000 COLONIES/mL ESCHERICHIA COLI (A)  Final   Report Status 03/05/2016 FINAL  Final   Organism ID, Bacteria ESCHERICHIA COLI (A)  Final      Susceptibility   Escherichia coli - MIC*    AMPICILLIN <=2 SENSITIVE Sensitive     CEFAZOLIN <=4 SENSITIVE Sensitive     CEFTRIAXONE <=1 SENSITIVE Sensitive     CIPROFLOXACIN <=0.25 SENSITIVE Sensitive     GENTAMICIN <=1 SENSITIVE Sensitive     IMIPENEM <=0.25 SENSITIVE Sensitive     NITROFURANTOIN 32 SENSITIVE Sensitive     TRIMETH/SULFA <=20 SENSITIVE Sensitive     AMPICILLIN/SULBACTAM <=2 SENSITIVE  Sensitive     PIP/TAZO <=4 SENSITIVE Sensitive     Extended ESBL NEGATIVE Sensitive     * >=100,000 COLONIES/mL ESCHERICHIA COLI  Respiratory Panel by PCR     Status: None   Collection Time: 03/07/16  5:08 PM  Result Value Ref Range Status   Adenovirus NOT DETECTED NOT DETECTED Final   Coronavirus 229E NOT DETECTED NOT DETECTED Final   Coronavirus HKU1 NOT DETECTED NOT DETECTED Final   Coronavirus NL63 NOT DETECTED NOT DETECTED Final   Coronavirus OC43 NOT DETECTED NOT DETECTED Final   Metapneumovirus NOT DETECTED NOT DETECTED Final   Rhinovirus / Enterovirus NOT DETECTED NOT DETECTED Final   Influenza A NOT DETECTED NOT DETECTED Final   Influenza B NOT DETECTED NOT DETECTED Final   Parainfluenza Virus 1 NOT DETECTED NOT DETECTED Final   Parainfluenza Virus 2 NOT DETECTED NOT DETECTED Final   Parainfluenza Virus 3 NOT DETECTED NOT DETECTED Final   Parainfluenza Virus 4 NOT DETECTED NOT DETECTED Final   Respiratory Syncytial Virus NOT DETECTED NOT DETECTED Final   Bordetella pertussis NOT DETECTED NOT DETECTED Final   Chlamydophila pneumoniae NOT DETECTED NOT DETECTED Final   Mycoplasma pneumoniae NOT DETECTED NOT DETECTED Final         Radiology Studies: Mr Lumbar Spine W Wo Contrast  Result Date: 03/07/2016 CLINICAL DATA:  Lumbar spine necrotic wound. Multiple sclerosis with spastic paraplegia. EXAM: MRI LUMBAR SPINE WITHOUT AND WITH CONTRAST TECHNIQUE: Multiplanar and multiecho pulse sequences of the lumbar spine were obtained without and with intravenous contrast. CONTRAST:  19mL MULTIHANCE GADOBENATE DIMEGLUMINE 529 MG/ML IV SOLN COMPARISON:  Body CT 06/25/2008 FINDINGS: Segmentation: Transitional lumbosacral vertebra with rudimentary S1-2 interspace, based on the lowest visible ribs. Alignment:  Normal Vertebrae:  No fracture, evidence of discitis, or bone lesion. Conus medullaris: Extends to the L2 level and appears normal. Thin fat deposition in the filum terminale, incidental.  Paraspinal and other soft tissues: Marked over distention of the urinary bladder with bilateral hydroureteronephrosis. The same appearance was present on 2010 abdominal CT. Nonspecific subcutaneous fat reticulation throughout the lumbar back. Negative for abscess or muscular edema. Disc levels: No degenerative changes or impingement. IMPRESSION:  1. Negative for lumbar spine infection or impingement. 2. There is non organized edema in the subcutaneous fat. Negative for abscess. 3. Urinary retention causing bilateral hydroureteronephrosis, also seen by CT in 2010. Electronically Signed   By: Monte Fantasia M.D.   On: 03/07/2016 17:10   Mr Pelvis W Wo Contrast  Result Date: 03/07/2016 CLINICAL DATA:  Decubitus ulcers. EXAM: MRI PELVIS WITHOUT AND WITH CONTRAST TECHNIQUE: Multiplanar multisequence MR imaging of the pelvis was performed both before and after administration of intravenous contrast. CONTRAST:  54mL MULTIHANCE GADOBENATE DIMEGLUMINE 529 MG/ML IV SOLN COMPARISON:  CT scan 06/25/2008. FINDINGS: Urinary Tract: Diffuse subcutaneous soft tissue swelling/ edema/ fluid involving the buttock areas and sacrum bilaterally, likely cellulitis. Shallow sacral decubitus ulcer is noted but no findings for underlying sacral osteomyelitis. No obvious ulcers over the hip or buttock areas. There is diffuse myositis involving the gluteus maximus muscle on the right and also the adductor muscles bilaterally. Small focal fluid collections in the lateral aspects of the gluteus maximus muscles bilaterally with rim enhancement suspicious for pyomyositis. The largest area is on the right side near the greater trochanter and measures 2 cm. No definite MR findings for septic arthritis or osteomyelitis. There is massive distention of the bladder well up into the abdomen to the level of the umbilicus. No pelvic adenopathy or free pelvic fluid collections. Borderline enlarged inguinal lymph nodes bilaterally. Bilateral lower extremity  cellulitis and myositis. IMPRESSION: 1. Cellulitis and diffuse myofasciitis involving the pelvic and hip musculature. There also or rim enhancing fluid collections in the gluteus maximus muscles bilaterally concerning for pyomyositis. 2. No findings to suggest septic arthritis or osteomyelitis. 3. Shallow sacral decubitus ulcer but no underlying sacral osteomyelitis. 4. Markedly distended bladder. Electronically Signed   By: Marijo Sanes M.D.   On: 03/07/2016 17:15   Dg Chest Port 1 View  Result Date: 03/07/2016 CLINICAL DATA:  Pneumonia. EXAM: PORTABLE CHEST 1 VIEW COMPARISON:  Radiograph of March 02, 2016. FINDINGS: Stable cardiomediastinal silhouette. Right lung is clear. Increased left perihilar and basilar opacity is noted concerning for worsening pneumonia or atelectasis with associated pleural effusion. No pneumothorax is noted. Bony thorax is unremarkable. IMPRESSION: Increase left lung opacity concerning for worsening pneumonia or atelectasis with associated pleural effusion. Electronically Signed   By: Marijo Conception, M.D.   On: 03/07/2016 14:39        Scheduled Meds: . ceFEPime (MAXIPIME) IV  1 g Intravenous Q8H  . collagenase   Topical Daily  . enoxaparin (LOVENOX) injection  20 mg Subcutaneous Q24H  . feeding supplement (ENSURE ENLIVE)  237 mL Oral BID BM  . Tbo-Filgrastim  300 mcg Subcutaneous Q24H  . vancomycin  1,000 mg Intravenous Q24H   Continuous Infusions: . dextrose 5 % 1,000 mL infusion 100 mL/hr at 03/08/16 0543     LOS: 6 days       Tawni Millers, MD [facility] Pager [phone]  If 7PM-7AM, please contact night-coverage [email or web address] Password [email or web address] 03/08/2016, 12:54 PM

## 2016-03-08 NOTE — Progress Notes (Signed)
Physical Therapy Wound Treatment Patient Details  Name: Deanna Baldwin MRN: [id number] Date of Birth: October 29, 1975  Today's Date: 03/08/2016 Time: 0938-1829 Time Calculation (min): 38 min  Subjective  Subjective: Pt pleasant and agreeable to therapy.  Patient and Family Stated Goals: Per pt for wounds to heal.   Date of Onset: 02/02/16 (pt indicates ~1 month ago.) Prior Treatments: None  Pain Score: Pt declines premedication. Appears painful with positioning but does not endorse pain during treatment session.   Wound Assessment  Wound / Incision (Open or Dehisced) 03/04/16 Non-pressure wound Sacrum Largest wound over sacrum (Active)  Dressing Type Moist to dry 03/08/2016 12:41 PM  Dressing Changed Changed 03/08/2016 12:41 PM  Dressing Status Clean;Dry;Intact 03/08/2016 12:41 PM  Dressing Change Frequency Daily 03/08/2016 12:41 PM  Site / Wound Assessment Granulation tissue 03/08/2016 12:41 PM  % Wound base Red or Granulating 50% 03/08/2016 12:41 PM  % Wound base Yellow/Fibrinous Exudate 50% 03/08/2016 12:41 PM  Peri-wound Assessment Maceration;Erythema (blanchable) 03/08/2016 12:41 PM  Wound Length (cm) 4.5 cm 03/04/2016 12:00 PM  Wound Width (cm) 6.4 cm 03/04/2016 12:00 PM  Margins Unattached edges (unapproximated) 03/08/2016 12:41 PM  Closure None 03/08/2016 12:41 PM  Drainage Amount Minimal 03/08/2016 12:41 PM  Drainage Description Serosanguineous 03/08/2016 12:41 PM  Treatment Hydrotherapy (Pulse lavage);Packing (Saline gauze) 03/06/2016 12:56 PM   Santyl applied to wound bed prior to applying dressing.    Wound / Incision (Open or Dehisced) 03/08/16 Non-pressure wound Back Superior to sacral wound (Active)  Dressing Type ABD;Barrier Film (skin prep);Gauze (Comment);Moist to dry 03/08/2016 12:41 PM  Dressing Changed New 03/08/2016 12:41 PM  Dressing Status Clean;Dry;Intact 03/08/2016 12:41 PM  Dressing Change Frequency Daily 03/08/2016 12:41 PM  Site / Wound Assessment Yellow;Black 03/08/2016 12:41 PM  % Wound base  Red or Granulating 0% 03/08/2016 12:41 PM  % Wound base Yellow/Fibrinous Exudate 30% 03/08/2016 12:41 PM  % Wound base Black/Eschar 70% 03/08/2016 12:41 PM  % Wound base Other/Granulation Tissue (Comment) 0% 03/08/2016 12:41 PM  Peri-wound Assessment Maceration;Intact 03/08/2016 12:41 PM  Wound Length (cm) 2.2 cm 03/08/2016 12:41 PM  Wound Width (cm) 1 cm 03/08/2016 12:41 PM  Wound Depth (cm) 0.2 cm 03/08/2016 12:41 PM  Margins Unattached edges (unapproximated) 03/08/2016 12:41 PM  Closure None 03/08/2016 12:41 PM  Drainage Amount Minimal 03/08/2016 12:41 PM  Drainage Description Serosanguineous 03/08/2016 12:41 PM  Treatment Debridement (Selective);Hydrotherapy (Pulse lavage);Packing (Saline gauze) 03/08/2016 12:41 PM  Santyl applied to wound bed prior to applying dressing.   Hydrotherapy Pulsed lavage therapy - wound location: Sacrum and wound superior to Pulsed Lavage with Suction (psi): 12 psi Pulsed Lavage with Suction - Normal Saline Used: 1000 mL Pulsed Lavage Tip: Tip with splash shield Selective Debridement Selective Debridement - Location: Sacrum Selective Debridement - Tools Used: Scalpel Selective Debridement - Tissue Removed: yellow and black necrotic tissue   Wound Assessment and Plan  Wound Therapy - Assess/Plan/Recommendations Wound Therapy - Clinical Statement: Pt incontinent of stool upon arrival and required assist cleaning up. Pt will continue to benefit from hydrotherapy for selective removal of necrotic tissue, as well as to promote wound bed healing.  Wound Therapy - Functional Problem List: Incontinence, decreased mobility Factors Delaying/Impairing Wound Healing: Altered sensation;Incontinence;Infection - systemic/local;Immobility;Multiple medical problems Hydrotherapy Plan: Debridement;Patient/family education;Pulsatile lavage with suction;Dressing change Wound Therapy - Frequency: 6X / week Wound Therapy - Current Recommendations: OT;PT;Case manager/social work Wound Therapy -  Follow Up Recommendations: Skilled nursing facility Wound Plan: See Above  Wound Therapy Goals- Improve the function of patient's  integumentary system by progressing the wound(s) through the phases of wound healing (inflammation - proliferation - remodeling) by: Decrease Necrotic Tissue to: 20 (Both wounds) Decrease Necrotic Tissue - Progress: Progressing toward goal Increase Granulation Tissue to: 80 (Both wounds) Increase Granulation Tissue - Progress: Progressing toward goal Goals/treatment plan/discharge plan were made with and agreed upon by patient/family: Yes Time For Goal Achievement: 7 days Wound Therapy - Potential for Goals: Good  Goals will be updated until maximal potential achieved or discharge criteria met.  Discharge criteria: when goals achieved, discharge from hospital, MD decision/surgical intervention, no progress towards goals, refusal/missing three consecutive treatments without notification or medical reason.  GP     Thelma Comp 03/08/2016, 12:59 PM  Rolinda Roan, PT, DPT Acute Rehabilitation Services Pager: [phone]

## 2016-03-08 NOTE — Progress Notes (Signed)
Speech Language Pathology Treatment: Dysphagia  Patient Details Name: Deanna Baldwin MRN: [id number] DOB: [birth year] Today's Date: 03/08/2016 Time: [id number] SLP Time Calculation (min) (ACUTE ONLY): 12 min  Assessment / Plan / Recommendation Clinical Impression  Deanna Baldwin was more alert and attentive during PO consumption this morning. Trials of dysphagia 3 solids and honey thick liquids were administered with no overt s/s of airway compromise at bedside. Discussed/educated Deanna Baldwin and RN re: free water protocol (known aspirators allowed to have thin water only after oral care, 30 min after POs, not with meals); Deanna Baldwin agreeable. Deanna Baldwin likely discharging to SNF; chronic dysphagia is probable (advanced MS), however, recommend continuing ST treatment at facility to consider diet/liquid upgrade, assess swallow functioning, and possible return for outpatient MBS due to silent nature of aspiration. Continue with current diet of dysphagia 3 solids, honey thick liquids, wholes meds with puree. ST will f/u for treatment briefly.   HPI HPI: Ptis a 41 y.o.femalewith PMH significant for MS with spastic paraplegia, presenting ED for eval of AMS and fever. Deanna Baldwin had upper respiratory illness for last 2-3 weeks, but noted today re-worsening. She was found at home confused and lethargic with temperature reportedly 104 F. Found to be septic and positive for urine infection. CXR from 03/02/2016 showed bilateral patchy alveolar opacities are noted concerning for pneumonia. Silent aspiration noted during thin liquids and nectar thick on MBS from 03/04/2016.       SLP Plan  Continue with current plan of care     Recommendations  Diet recommendations: Dysphagia 3 (mechanical soft);Honey-thick liquid Liquids provided via: Cup Medication Administration: Whole meds with puree Supervision: Patient able to self feed;Full supervision/cueing for compensatory strategies Compensations: Small sips/bites;Slow rate;Minimize  environmental distractions;Clear throat intermittently Postural Changes and/or Swallow Maneuvers: Seated upright 90 degrees                Oral Care Recommendations: Oral care BID Follow up Recommendations: Skilled Nursing facility Plan: Continue with current plan of care       [place] , [place] 03/08/2016, 9:37 AM

## 2016-03-08 NOTE — Progress Notes (Signed)
IR consulted for possible drainage of gluteal muscle fluid collection.  Reviewed case with Dr. Earleen Newport.  Drain placement is not recommended based on patient anatomy and size/distribution of fluid collection.  MD aware.   Deanna Baldwin 03/08/16 3:17 PM

## 2016-03-08 NOTE — Progress Notes (Signed)
Pharmacy Antibiotic Note  Deanna Baldwin is a 41 y.o. female with PMHx Advanced Multiple Sclerosis, with spastic paraplegia , admitted on 03/02/2016 with sepsis, due to positive E coli UTI, Osteomyelitis of sacrum, bilateral pneumonia (possible aspiration). Pharmacy has been consulted for vancomycin for Pneumonia. SCr 0.99, CrCl unclear as patient is paraplegic WBC was 13.2k on 03/02/16.  Tmax 102.9, Lactate up to 2.5 2/1: vanc trough was 7 on 500 mg q 12 hour  Vancomycin trough goal 15-20 mcg/ml  Plan: Vancomycin 1000 mg IV q12h Cefepime as per MD's orders Monitor clinical progress, c/s, renal function, abx plan/LOT, VT@ steady state  Height: 5\' 9"  (175.3 cm) Weight: 95 lb (43.1 kg) IBW/kg (Calculated) : 66.2  Temp (24hrs), Avg:100.1 F (37.8 C), Min:98.2 F (36.8 C), Max:102.9 F (39.4 C)   Recent Labs Lab 03/02/16 1843 03/02/16 2024 03/02/16 2252  03/04/16 0433 03/04/16 0906 03/05/16 0800 03/06/16 0602 03/07/16 0840 03/07/16 1210 03/08/16 0526  WBC  --   --   --   < > 6.1  --  2.9* 2.3* 1.8* 1.8* 3.5*  CREATININE  --   --   --   < > 0.77  --  0.87 0.79 0.99  --  0.99  LATICACIDVEN 3.45* 1.3 1.1  --   --   --   --   --   --  2.5*  --   VANCOTROUGH  --   --   --   --   --  7*  --   --   --   --   --   < > = values in this interval not displayed.  Estimated Creatinine Clearance: 51.9 mL/min (by C-G formula based on SCr of 0.99 mg/dL).    No Known Allergies  Antimicrobials this admission: 1/30 Vanc>>1/31, 2/4>> 1/30 Zosyn >>2/4  2/4 Cefepime >> (2/12)   Dose adjustments this admission: 2/5: adjusting vancomycin interval to q 12 hour   Microbiology results: 1/30 blood >>neg 1/30 urine >>E. Coli:  Pan sensitive 1/31 influenza A/B negative 2/4 Resp panel>>neg 2/4 Sputum>>sent 2/4 blood>> sent 2/4 urine >>sent   Thank you for allowing [place] to participate in this patients care.  Jens Som, PharmD Clinical phone for 03/08/2016 from 7a-3:30p: [phone] If  after 3:30p, please call main pharmacy at: [phone] 03/08/2016 2:30 PM

## 2016-03-08 NOTE — Progress Notes (Signed)
Nutrition Follow-up  DOCUMENTATION CODES:   Underweight  INTERVENTION:   Ensure Enlive po BID, each supplement provides 350 kcal and 20 grams of protein  NUTRITION DIAGNOSIS:   Increased nutrient needs related to acute illness, wound healing as evidenced by increased estimated needs from protein.  GOAL:   Patient will meet greater than or equal to 90% of their needs  MONITOR:   PO intake, Supplement acceptance, Labs, Weight trends, Skin, I & O's  ASSESSMENT:   41 yo female, with advanced multiple sclerosis, wheelchair-bound at baseline admitted with encephalopathy, fevers, sepsis noted to have abnormal UA and bibasilar pulmonary infiltrates.    Spoke to RN today. Pt ordered to have calorie count but envelope was never put on door. RN will hang envelope and start calorie count for tomorrow. Pt eating 25% meals and drinking one Ensure per day. Told RN to encourage Ensure intake. Pt on DYS 3/honey thick diet. Pt's weights have fluctuated since admit but appear to be stable. Pt with hypernatremia today. Pt on IV fluids; continue to encourage PO intake.     Medications reviewed and include: cefepime, lovenox, vancomycin, dextrose infusion   Labs reviewed: Na 151(H), Cl 112(H), Ca 8.1(L), lactic acid 2.5(H) 2/4 Wbc- 3.5(L), Hgb 9.1(L), Hct 30.8(L)  Diet Order:  DIET DYS 3 Room service appropriate? Yes; Fluid consistency: Honey Thick  Skin:  Wound (see comment) (unstagable pressure injury and open wound on sacrum )  Last BM:  2/2  Height:   Ht Readings from Last 1 Encounters:  03/02/16 5\' 9"  (1.753 m)    Weight:   Wt Readings from Last 1 Encounters:  03/08/16 95 lb (43.1 kg)    Ideal Body Weight:  66 kg  BMI:  Body mass index is 14.03 kg/m.  Estimated Nutritional Needs:   Kcal:  1300-1500  Protein:  55-65 gm  Fluid:  >/= 1.5 L  EDUCATION NEEDS:   No education needs identified at this time  Koleen Distance, RD, [place] Pager #- [phone]

## 2016-03-08 NOTE — Consult Note (Signed)
Date of Admission:  03/02/2016  Date of Consult:  03/08/2016  Reason for Consult: pyomyositis Referring Physician: Dr. Cathlean Sauer   HPI: Deanna Baldwin is an 41 y.o. 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 Prior to admission, she had an acute respiratory illness and was last seen in normal mental state on 03/01/16, one day prior to admission. She was started on piperacillin/tazobactam for suspected sepsis from urinary source given bacteriuria. Blood cultures, respiratory virus panel, flu PCR, and HIV resulted negative. Multiple portable CXR films on admission noted persistent bilateral infiltrates raising concern for aspiration pneumonia for which she was switched to cefepime. MR imaging of her pelvis and L-spine yesterday noted cellulitis and diffuse myofasciitis of the hip and pelvic musculature with concern for gluteal pyomyositis though no signs of osteomyelitis or septic arthritis. Vancomycin was added today, and ID was consulted for further management. Her course has been complicated by leukopenia on 03/05/16 for which she is receiving filgrastim.  Per her sister, she has been declining over the past 1 year. She and her son both had an infection in December. She went to Urgent Care a few weeks ago and was given OTC medications. She developed progressive weakness over the next few weeks. On 03/01/16, she could not really speak intelligibly, so they called an ambulance. She has been wheelchair bound for 2-3 years, and her 73 year old son is the primary caregiver. Neither her sister or great aunt are involved.  Today, she [the patient] tells me she is followed at [facility] for multiple sclerosis and is not on any chronic medications. She denies ever having problems related to rituximab in the past. She denies any fever, chills, difficulty breathing, chest pain, poor appetite, dysphagia, abdominal  pain, nausea, vomiting, polyuria, dysuria, rash, back pain.    Past Medical History:  Diagnosis Date  . Buttock wound 03/03/2016  . MS (multiple sclerosis) ([place])   . Pneumonia 02/2016  . Protein calorie malnutrition ([place])   . Severe sepsis ([place]) 03/03/2016  . UTI (urinary tract infection) 02/2016    Past Surgical History:  Procedure Laterality Date  . NO PAST SURGERIES      Social History:  reports that she quit smoking about 6 years ago. Her smoking use included Cigarettes. She smoked 1.00 pack per day. She has never used smokeless tobacco. She reports that she does not drink alcohol or use drugs.   Family History  Problem Relation Age of Onset  . Mental illness Sister     No Known Allergies   Medications: I have reviewed patients current medications as documented in Epic Anti-infectives    Start     Dose/Rate Route Frequency Ordered Stop   03/08/16 1500  vancomycin (VANCOCIN) IVPB 1000 mg/200 mL premix     1,000 mg 200 mL/hr over 60 Minutes Intravenous Every 12 hours 03/08/16 1425     03/07/16 1530  vancomycin (VANCOCIN) IVPB 1000 mg/200 mL premix  Status:  Discontinued     1,000 mg 200 mL/hr over 60 Minutes Intravenous Every 24 hours 03/07/16 1444 03/08/16 1425   03/07/16 1500  ceFEPIme (MAXIPIME) 1 g in dextrose 5 % 50 mL IVPB     1 g 100 mL/hr over 30 Minutes Intravenous Every 8 hours 03/07/16 1413 03/15/16 1359   03/04/16 0000  piperacillin-tazobactam (ZOSYN) IVPB 3.375 g  Status:  Discontinued     3.375 g 12.5 mL/hr over 240  Minutes Intravenous Every 8 hours 03/03/16 1634 03/07/16 1412   03/03/16 0800  vancomycin (VANCOCIN) 500 mg in sodium chloride 0.9 % 100 mL IVPB  Status:  Discontinued     500 mg 100 mL/hr over 60 Minutes Intravenous Every 12 hours 03/02/16 2039 03/04/16 1132   03/03/16 0300  piperacillin-tazobactam (ZOSYN) IVPB 3.375 g  Status:  Discontinued     3.375 g 12.5 mL/hr over 240 Minutes Intravenous Every 8 hours 03/02/16 2039 03/03/16 1634    03/02/16 1915  piperacillin-tazobactam (ZOSYN) IVPB 3.375 g     3.375 g 100 mL/hr over 30 Minutes Intravenous  Once 03/02/16 1900 03/02/16 1952   03/02/16 1915  vancomycin (VANCOCIN) IVPB 1000 mg/200 mL premix     1,000 mg 200 mL/hr over 60 Minutes Intravenous  Once 03/02/16 1900 03/02/16 2052      ROS: As noted in HPI otherwise remainder of 12 point Review of Systems is negative  Blood pressure 106/69, pulse (!) 136, temperature (!) 102.9 F (39.4 C), temperature source Oral, resp. rate 16, height '5\' 9"'  (1.753 m), weight 95 lb (43.1 kg), last menstrual period 01/28/2016, SpO2 90 %. General: Alert and awake, oriented x 3, not in any acute distress. HEENT: anicteric sclera,  EOMI, oropharynx with dry mucous membranes Cardiovascular: sinus tachycardia,  no murmur rubs or gallops Pulmonary: clear to auscultation bilaterally, no wheezing, rales or rhonchi Gastrointestinal: soft nontender, nondistended, normal bowel sounds, Musculoskeletal: unable to test flexion/extension of lower extremities bilaterally given contractures Skin, soft tissue: ulcers of the lower back as photographed below [right buttock and then left buttock]          Results for orders placed or performed during the hospital encounter of 03/02/16 (from the past 48 hour(s))  Glucose, capillary     Status: Abnormal   Collection Time: 03/07/16  8:02 AM  Result Value Ref Range   Glucose-Capillary 115 (H) 65 - 99 mg/dL  Basic metabolic panel     Status: Abnormal   Collection Time: 03/07/16  8:40 AM  Result Value Ref Range   Sodium 143 135 - 145 mmol/L   Potassium 3.2 (L) 3.5 - 5.1 mmol/L   Chloride 103 101 - 111 mmol/L   CO2 28 22 - 32 mmol/L   Glucose, Bld 112 (H) 65 - 99 mg/dL   BUN 9 6 - 20 mg/dL   Creatinine, Ser 0.99 0.44 - 1.00 mg/dL   Calcium 7.4 (L) 8.9 - 10.3 mg/dL   GFR calc non Af Amer >60 >60 mL/min   GFR calc Af Amer >60 >60 mL/min    Comment: (NOTE) The eGFR has been calculated using the CKD EPI  equation. This calculation has not been validated in all clinical situations. eGFR's persistently <60 mL/min signify possible Chronic Kidney Disease.    Anion gap 12 5 - 15  Magnesium     Status: None   Collection Time: 03/07/16  8:40 AM  Result Value Ref Range   Magnesium 2.0 1.7 - 2.4 mg/dL  CBC     Status: Abnormal   Collection Time: 03/07/16  8:40 AM  Result Value Ref Range   WBC 1.8 (L) 4.0 - 10.5 K/uL   RBC 3.36 (L) 3.87 - 5.11 MIL/uL   Hemoglobin 9.3 (L) 12.0 - 15.0 g/dL   HCT 31.0 (L) 36.0 - 46.0 %   MCV 92.3 78.0 - 100.0 fL   MCH 27.7 26.0 - 34.0 pg   MCHC 30.0 30.0 - 36.0 g/dL   RDW 14.0 11.5 -  15.5 %   Platelets 299 150 - 400 K/uL  CBC with Differential/Platelet     Status: Abnormal   Collection Time: 03/07/16 12:10 PM  Result Value Ref Range   WBC 1.8 (L) 4.0 - 10.5 K/uL   RBC 3.54 (L) 3.87 - 5.11 MIL/uL   Hemoglobin 9.6 (L) 12.0 - 15.0 g/dL   HCT 32.8 (L) 36.0 - 46.0 %   MCV 92.7 78.0 - 100.0 fL   MCH 27.1 26.0 - 34.0 pg   MCHC 29.3 (L) 30.0 - 36.0 g/dL   RDW 13.8 11.5 - 15.5 %   Platelets 322 150 - 400 K/uL   Neutrophils Relative % 53 %   Lymphocytes Relative 45 %   Monocytes Relative 1 %   Eosinophils Relative 1 %   Basophils Relative 0 %   Neutro Abs 1.0 (L) 1.7 - 7.7 K/uL   Lymphs Abs 0.8 0.7 - 4.0 K/uL   Monocytes Absolute 0.0 (L) 0.1 - 1.0 K/uL   Eosinophils Absolute 0.0 0.0 - 0.7 K/uL   Basophils Absolute 0.0 0.0 - 0.1 K/uL   Smear Review MORPHOLOGY UNREMARKABLE   Lactic acid, plasma     Status: Abnormal   Collection Time: 03/07/16 12:10 PM  Result Value Ref Range   Lactic Acid, Venous 2.5 (HH) 0.5 - 1.9 mmol/L    Comment: CRITICAL RESULT CALLED TO, READ BACK BY AND VERIFIED WITH: KATIE ADAMS RN AT 1304 03/07/16 BY WOOLLENK   HIV antibody     Status: None   Collection Time: 03/07/16  2:37 PM  Result Value Ref Range   HIV Screen 4th Generation wRfx Non Reactive Non Reactive    Comment: (NOTE) Performed At: [facility] [place], [place] [id number] Lindon Romp MD QQ:[id number]   Strep pneumoniae urinary antigen     Status: None   Collection Time: 03/07/16  5:08 PM  Result Value Ref Range   Strep Pneumo Urinary Antigen NEGATIVE NEGATIVE    Comment:        Infection due to S. pneumoniae cannot be absolutely ruled out since the antigen present may be below the detection limit of the test.   Respiratory Panel by PCR     Status: None   Collection Time: 03/07/16  5:08 PM  Result Value Ref Range   Adenovirus NOT DETECTED NOT DETECTED   Coronavirus 229E NOT DETECTED NOT DETECTED   Coronavirus HKU1 NOT DETECTED NOT DETECTED   Coronavirus NL63 NOT DETECTED NOT DETECTED   Coronavirus OC43 NOT DETECTED NOT DETECTED   Metapneumovirus NOT DETECTED NOT DETECTED   Rhinovirus / Enterovirus NOT DETECTED NOT DETECTED   Influenza A NOT DETECTED NOT DETECTED   Influenza B NOT DETECTED NOT DETECTED   Parainfluenza Virus 1 NOT DETECTED NOT DETECTED   Parainfluenza Virus 2 NOT DETECTED NOT DETECTED   Parainfluenza Virus 3 NOT DETECTED NOT DETECTED   Parainfluenza Virus 4 NOT DETECTED NOT DETECTED   Respiratory Syncytial Virus NOT DETECTED NOT DETECTED   Bordetella pertussis NOT DETECTED NOT DETECTED   Chlamydophila pneumoniae NOT DETECTED NOT DETECTED   Mycoplasma pneumoniae NOT DETECTED NOT DETECTED  Urine culture     Status: None   Collection Time: 03/07/16  5:08 PM  Result Value Ref Range   Specimen Description URINE, CATHETERIZED    Special Requests NONE    Culture NO GROWTH    Report Status 03/08/2016 FINAL   Basic metabolic panel     Status: Abnormal   Collection Time: 03/08/16  5:26 AM  Result Value Ref Range   Sodium 151 (H) 135 - 145 mmol/L    Comment: DELTA CHECK NOTED   Potassium 4.2 3.5 - 5.1 mmol/L    Comment: DELTA CHECK NOTED   Chloride 112 (H) 101 - 111 mmol/L   CO2 31 22 - 32 mmol/L   Glucose, Bld 94 65 - 99 mg/dL   BUN 9 6 - 20 mg/dL   Creatinine, Ser 0.99 0.44 - 1.00 mg/dL    Calcium 8.1 (L) 8.9 - 10.3 mg/dL   GFR calc non Af Amer >60 >60 mL/min   GFR calc Af Amer >60 >60 mL/min    Comment: (NOTE) The eGFR has been calculated using the CKD EPI equation. This calculation has not been validated in all clinical situations. eGFR's persistently <60 mL/min signify possible Chronic Kidney Disease.    Anion gap 8 5 - 15  Magnesium     Status: None   Collection Time: 03/08/16  5:26 AM  Result Value Ref Range   Magnesium 1.8 1.7 - 2.4 mg/dL  CBC with Differential/Platelet     Status: Abnormal   Collection Time: 03/08/16  5:26 AM  Result Value Ref Range   WBC 3.5 (L) 4.0 - 10.5 K/uL   RBC 3.33 (L) 3.87 - 5.11 MIL/uL   Hemoglobin 9.1 (L) 12.0 - 15.0 g/dL   HCT 30.8 (L) 36.0 - 46.0 %   MCV 92.5 78.0 - 100.0 fL   MCH 27.3 26.0 - 34.0 pg   MCHC 29.5 (L) 30.0 - 36.0 g/dL   RDW 14.0 11.5 - 15.5 %   Platelets 295 150 - 400 K/uL   Neutrophils Relative % 78 %   Neutro Abs 2.7 1.7 - 7.7 K/uL   Lymphocytes Relative 15 %   Lymphs Abs 0.5 (L) 0.7 - 4.0 K/uL   Monocytes Relative 5 %   Monocytes Absolute 0.2 0.1 - 1.0 K/uL   Eosinophils Relative 2 %   Eosinophils Absolute 0.1 0.0 - 0.7 K/uL   Basophils Relative 0 %   Basophils Absolute 0.0 0.0 - 0.1 K/uL  Glucose, capillary     Status: Abnormal   Collection Time: 03/08/16  8:00 AM  Result Value Ref Range   Glucose-Capillary 115 (H) 65 - 99 mg/dL   '@BRIEFLABTABLE' (sdes,specrequest,cult,reptstatus)   ) Recent Results (from the past 720 hour(s))  Culture, blood (Routine x 2)     Status: None   Collection Time: 03/02/16  6:32 PM  Result Value Ref Range Status   Specimen Description BLOOD RIGHT FOREARM  Final   Special Requests BOTTLES DRAWN AEROBIC AND ANAEROBIC 5CC  Final   Culture NO GROWTH 5 DAYS  Final   Report Status 03/07/2016 FINAL  Final  Culture, blood (Routine x 2)     Status: None   Collection Time: 03/02/16  6:39 PM  Result Value Ref Range Status   Specimen Description BLOOD RIGHT HAND  Final    Special Requests BOTTLES DRAWN AEROBIC AND ANAEROBIC 5CC  Final   Culture NO GROWTH 5 DAYS  Final   Report Status 03/07/2016 FINAL  Final  Urine culture     Status: Abnormal   Collection Time: 03/02/16  7:29 PM  Result Value Ref Range Status   Specimen Description URINE, CATHETERIZED  Final   Special Requests NONE  Final   Culture >=100,000 COLONIES/mL ESCHERICHIA COLI (A)  Final   Report Status 03/05/2016 FINAL  Final   Organism ID, Bacteria ESCHERICHIA COLI (A)  Final  Susceptibility   Escherichia coli - MIC*    AMPICILLIN <=2 SENSITIVE Sensitive     CEFAZOLIN <=4 SENSITIVE Sensitive     CEFTRIAXONE <=1 SENSITIVE Sensitive     CIPROFLOXACIN <=0.25 SENSITIVE Sensitive     GENTAMICIN <=1 SENSITIVE Sensitive     IMIPENEM <=0.25 SENSITIVE Sensitive     NITROFURANTOIN 32 SENSITIVE Sensitive     TRIMETH/SULFA <=20 SENSITIVE Sensitive     AMPICILLIN/SULBACTAM <=2 SENSITIVE Sensitive     PIP/TAZO <=4 SENSITIVE Sensitive     Extended ESBL NEGATIVE Sensitive     * >=100,000 COLONIES/mL ESCHERICHIA COLI  Respiratory Panel by PCR     Status: None   Collection Time: 03/07/16  5:08 PM  Result Value Ref Range Status   Adenovirus NOT DETECTED NOT DETECTED Final   Coronavirus 229E NOT DETECTED NOT DETECTED Final   Coronavirus HKU1 NOT DETECTED NOT DETECTED Final   Coronavirus NL63 NOT DETECTED NOT DETECTED Final   Coronavirus OC43 NOT DETECTED NOT DETECTED Final   Metapneumovirus NOT DETECTED NOT DETECTED Final   Rhinovirus / Enterovirus NOT DETECTED NOT DETECTED Final   Influenza A NOT DETECTED NOT DETECTED Final   Influenza B NOT DETECTED NOT DETECTED Final   Parainfluenza Virus 1 NOT DETECTED NOT DETECTED Final   Parainfluenza Virus 2 NOT DETECTED NOT DETECTED Final   Parainfluenza Virus 3 NOT DETECTED NOT DETECTED Final   Parainfluenza Virus 4 NOT DETECTED NOT DETECTED Final   Respiratory Syncytial Virus NOT DETECTED NOT DETECTED Final   Bordetella pertussis NOT DETECTED NOT  DETECTED Final   Chlamydophila pneumoniae NOT DETECTED NOT DETECTED Final   Mycoplasma pneumoniae NOT DETECTED NOT DETECTED Final  Urine culture     Status: None   Collection Time: 03/07/16  5:08 PM  Result Value Ref Range Status   Specimen Description URINE, CATHETERIZED  Final   Special Requests NONE  Final   Culture NO GROWTH  Final   Report Status 03/08/2016 FINAL  Final     Impression/Recommendation  Principal Problem:   Severe sepsis ([place]) Active Problems:   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  Right gluteal pyomyositis: Risk factors include immunocompromised state from receiving rituximab 01/20/16 and limited mobility. She is not receiving adequate wound care at home per report from the family.  Suspect skin flora, like Strep or Staph, though image above raises concern for urine/fecal contamination. Per primary, IR defers drainage given size of abscess and technical difficulties. -Narrow to vancomycin for now -Follow-up repeat blood cultures 2/4 -Wound care following, appreciate recommendations  Lymphopenia: Piperacillin/tazobactam can sometimes cause drops in WBC. Per review of the lab values, white count dropped between 1/31 and 2/1, and no other offending agents identified. Review of her lab values in [place] is without  prior low WBC, so we assume it is a new process.  -Trend CBC and differential -  Discontinue filgrastim as it confounds our ability to discern if fever is related to infection or this agent. -Discontinue piperacillin/tazobactam  Thank you so much for this interesting consult  [place] for [place] [phone] (pager) [phone] (office) 03/08/2016, 3:14 PM  Charlott Rakes 03/08/2016, 3:14 PM

## 2016-03-08 NOTE — Consult Note (Signed)
Consultation Note Date: 03/08/2016   Patient Name: Deanna Baldwin  DOB: 1975-08-20  MRN: [id number]  Age / Sex: 41 y.o., female  PCP: [name], MD Referring Physician: Tawni Millers, *  Reason for Consultation: Establishing goals of care and Psychosocial/spiritual support 2/2 ES-MS and its natural trajectory  HPI/Patient Profile: 41 y.o. female  admitted on 03/02/2016 with PMH significant for MS with paraplegia/ she is followed at [facility].  Admitted with altered mental status and fever.  She lives at home with her 76 yo son, Deanna Baldwin.  She has no in home help.  She is wheel-chair bound, found with sacral wounds  In ER with positive urine culture and chest x-ray positive for pneumonia.  She is frail, with dysphagia and severe protein caloria malnutrition/albumin 1.7.   She currently is receiving rituximab every 6 months, which may be influencing an immunocompromised state.     MRI pelvis with multiple soft tissue collections, with myositis right gluteal region, suspected pyomyositis, ID consulted  Patient faces many major decisions specific to viable treatment options and anticipatory care needs for herself and her young son.             Clinical Assessment and Goals of Care:  This NP Wadie Lessen reviewed medical records, received report from team, assessed the patient and then meet at the patient's bedside  to discuss diagnosis (natrual trajectory of MS), prognosis (dependant on desire for life prolonging measures), GOC, EOL wishes disposition and options.  Patient is very lethargic today but gives me permission to speak with her family.  I called her Deanna Baldwin and sister  Deanna Baldwin.  Detailed conversations with both.  Both family members verbalize many concerns for both the patient and her young son.    Patient has a twin sister with schzophrenia and is disabled in [place] in  [place]  discussion was had today regarding advanced directives.  Importance of documentation of HPOA and AD stressted  Concepts specific to code status, artifical feeding and hydration, continued IV antibiotics and rehospitalization was had.  The difference between a aggressive medical intervention path  and a palliative comfort care path for this patient at this time was had.  Values and goals of care important to patient and family were attempted to be elicited.  Concept of Hospice and Palliative Care were discussed  Questions and concerns addressed.   Family encouraged to call with questions or concerns.  PMT will continue to support holistically.  No documented HPOA-encouraged family to secure if patient is agreeable.  Meeting is scheduled for Wednesday morning with family at patient's bedside    SUMMARY OF RECOMMENDATIONS    Code Status/Advance Care Planning:  Full code   Palliative Prophylaxis:   Aspiration, Bowel Regimen, Frequent Pain Assessment and Oral Care  Additional Recommendations (Limitations, Scope, Preferences):  At this time patient is open to all offered an available medical interventions to  prolong life, hopeful for improvement and ultimate goal is to return home.  Neurology consult for  f/u here in [facility] requested by family  Psycho-social/Spiritual:   Desire for further Chaplaincy support:yes  Additional Recommendations: Referral to Community Resources   Prognosis:   Unable to determine  Discharge Planning: To Be Determined      Primary Diagnoses: Present on Admission: . 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 Severe sepsis ([place]) . Community acquired pneumonia . UTI (urinary tract infection) . Abnormal LFTs . Buttock wound . Protein calorie malnutrition ([place]) . Hypokalemia   I have reviewed the medical record, interviewed the patient and family, and examined the patient. The following aspects are  pertinent.  Past Medical History:  Diagnosis Date  . Buttock wound 03/03/2016  . MS (multiple sclerosis) ([place])   . Pneumonia 02/2016  . Protein calorie malnutrition ([place])   . Severe sepsis ([place]) 03/03/2016  . UTI (urinary tract infection) 02/2016   Social History   Social History  . Marital status: Single    Spouse name: N/A  . Number of children: N/A  . Years of education: N/A   Social History Main Topics  . Smoking status: Former Smoker    Packs/day: 1.00    Types: Cigarettes    Quit date: 02/01/2010  . Smokeless tobacco: Never Used  . Alcohol use No  . Drug use: No  . Sexual activity: Not Asked   Other Topics Concern  . None   Social History Narrative  . None   Family History  Problem Relation Age of Onset  . Mental illness Sister    Scheduled Meds: . ceFEPime (MAXIPIME) IV  1 g Intravenous Q8H  . collagenase   Topical Daily  . enoxaparin (LOVENOX) injection  20 mg Subcutaneous Q24H  . feeding supplement (ENSURE ENLIVE)  237 mL Oral BID BM  . Tbo-Filgrastim  300 mcg Subcutaneous Q24H  . vancomycin  1,000 mg Intravenous Q24H   Continuous Infusions: . dextrose 5 % 1,000 mL infusion 100 mL/hr at 03/08/16 0543   PRN Meds:.acetaminophen **OR** acetaminophen, bisacodyl, HYDROcodone-acetaminophen, ondansetron **OR** ondansetron (ZOFRAN) IV, polyethylene glycol, RESOURCE THICKENUP CLEAR Medications Prior to Admission:  Prior to Admission medications   Medication Sig Start Date End Date Taking? Authorizing Provider  riTUXimab 1,000 mg in sodium chloride 0.9 % 250 mL Inject 1,000 mg into the vein every 6 (six) months.    Yes Historical Provider, MD   No Known Allergies Review of Systems  Unable to perform ROS: Acuity of condition    Physical Exam  Constitutional: She appears lethargic. She appears cachectic. She appears ill.  Cardiovascular: Tachycardia present.   Pulmonary/Chest: She has decreased breath sounds in the right lower field and the left lower field.   Musculoskeletal:  generalized weakness and atrophy  Neurological: She appears lethargic.  Skin: Skin is warm and dry.  See EMR for documentation of wounds    Vital Signs: BP 106/71 (BP Location: Left Arm)   Pulse 99   Temp 99.1 F (37.3 C) (Oral)   Resp 18   Ht 5\' 9"  (1.753 m)   Wt 43.1 kg (95 lb)   LMP 01/28/2016   SpO2 100%   BMI 14.03 kg/m  Pain Assessment: No/denies pain   Pain Score: 0-No pain   SpO2: SpO2: 100 % O2 Device:SpO2: 100 % O2 Flow Rate: .O2 Flow Rate (L/min): 2 L/min  IO: Intake/output summary:  Intake/Output Summary (Last 24 hours) at 03/08/16 1238 Last data filed at 03/08/16 [id number]  Gross per 24 hour  Intake  1418.34 ml  Output             5750 ml  Net         -4331.66 ml    LBM: Last BM Date: 03/05/16 Baseline Weight: Weight: 49.9 kg (110 lb) Most recent weight: Weight: 43.1 kg (95 lb)       Palliative Assessment/Data: 30 % at best   Flowsheet Rows   Flowsheet Row Most Recent Value  Intake Tab  Referral Department  Hospitalist  Unit at Time of Referral  Med/Surg Unit  Palliative Care Primary Diagnosis  Sepsis/Infectious Disease  Date Notified  03/07/16  Palliative Care Type  New Palliative care  Reason for referral  Clarify Goals of Care  Date of Admission  03/02/16  # of days IP prior to Palliative referral  5  Clinical Assessment  Psychosocial & Spiritual Assessment  Palliative Care Outcomes     Discussed with Dr Cathlean Sauer  Time In: 1300 Time Out: 1415 Time Total: 75 min Greater than 50%  of this time was spent counseling and coordinating care related to the above assessment and plan.  Signed by: Wadie Lessen, NP   Please contact Palliative Medicine Team phone at [phone] for questions and concerns.  For individual provider: See Shea Evans

## 2016-03-09 DIAGNOSIS — E876 Hypokalemia: Secondary | ICD-10-CM

## 2016-03-09 DIAGNOSIS — E870 Hyperosmolality and hypernatremia: Secondary | ICD-10-CM

## 2016-03-09 DIAGNOSIS — M609 Myositis, unspecified: Secondary | ICD-10-CM

## 2016-03-09 DIAGNOSIS — M6289 Other specified disorders of muscle: Secondary | ICD-10-CM

## 2016-03-09 LAB — CBC WITH DIFFERENTIAL/PLATELET
BASOS ABS: 0 10*3/uL (ref 0.0–0.1)
Basophils Relative: 2 %
EOS ABS: 0 10*3/uL (ref 0.0–0.7)
Eosinophils Relative: 2 %
HCT: 29 % — ABNORMAL LOW (ref 36.0–46.0)
Hemoglobin: 8.7 g/dL — ABNORMAL LOW (ref 12.0–15.0)
LYMPHS ABS: 1 10*3/uL (ref 0.7–4.0)
Lymphocytes Relative: 48 %
MCH: 27.3 pg (ref 26.0–34.0)
MCHC: 30 g/dL (ref 30.0–36.0)
MCV: 90.9 fL (ref 78.0–100.0)
MONO ABS: 0.2 10*3/uL (ref 0.1–1.0)
Monocytes Relative: 10 %
NEUTROS ABS: 0.7 10*3/uL — AB (ref 1.7–7.7)
Neutrophils Relative %: 38 %
PLATELETS: 267 10*3/uL (ref 150–400)
RBC: 3.19 MIL/uL — AB (ref 3.87–5.11)
RDW: 13.7 % (ref 11.5–15.5)
WBC: 1.9 10*3/uL — AB (ref 4.0–10.5)

## 2016-03-09 LAB — BASIC METABOLIC PANEL
Anion gap: 12 (ref 5–15)
BUN: 5 mg/dL — ABNORMAL LOW (ref 6–20)
CALCIUM: 8.2 mg/dL — AB (ref 8.9–10.3)
CHLORIDE: 99 mmol/L — AB (ref 101–111)
CO2: 29 mmol/L (ref 22–32)
CREATININE: 0.67 mg/dL (ref 0.44–1.00)
Glucose, Bld: 93 mg/dL (ref 65–99)
Potassium: 3 mmol/L — ABNORMAL LOW (ref 3.5–5.1)
SODIUM: 140 mmol/L (ref 135–145)

## 2016-03-09 LAB — MAGNESIUM: Magnesium: 1.5 mg/dL — ABNORMAL LOW (ref 1.7–2.4)

## 2016-03-09 LAB — PATHOLOGIST SMEAR REVIEW

## 2016-03-09 MED ORDER — DEXTROSE IN LACTATED RINGERS 5 % IV SOLN
INTRAVENOUS | Status: DC
  Administered 2016-03-09 – 2016-03-13 (×5): via INTRAVENOUS

## 2016-03-09 MED ORDER — POTASSIUM CHLORIDE 20 MEQ PO PACK
40.0000 meq | PACK | ORAL | Status: AC
  Administered 2016-03-09 (×2): 40 meq via ORAL
  Filled 2016-03-09 (×2): qty 2

## 2016-03-09 MED ORDER — DOXYCYCLINE HYCLATE 100 MG IV SOLR
100.0000 mg | Freq: Two times a day (BID) | INTRAVENOUS | Status: DC
  Administered 2016-03-09 – 2016-03-11 (×5): 100 mg via INTRAVENOUS
  Filled 2016-03-09 (×5): qty 100

## 2016-03-09 NOTE — Progress Notes (Signed)
Calorie Count Note  48 hour calorie count ordered.  Diet: Dysphagia 3 diet with honey thick liquids Supplements: Ensure Enlive po BID, each supplement provides 350 kcal and 20 grams of protein  03/08/16 Breakfast: 127 kcals, 3 grams protein Lunch: 148 kcals, 5 grams protein Dinner: 194 kcals, 7 grams protein Supplements: n/a  Total intake: 469 kcal (36% of minimum estimated needs)  15 protein (27% of minimum estimated needs)  03/09/16 Breakfast: 485 kcals, 9 grams protein Lunch: n/a Dinner: n/a Supplements: n/a  Nutrition Dx: Increased nutrient needs related to acute illness, wound healing as evidenced by increased estimated needs from protein; ongoing  Goal: Patient will meet greater than or equal to 90% of their needs; unmet  Intervention:   -Continue Ensure Enlive po BID, each supplement provides 350 kcal and 20 grams of protein (thicken to appropriate consistency) -Magic Cup TID with meals  Jodene Polyak A. Jimmye Norman, RD, LDN, CDE Pager: [phone] After hours Pager: [phone]

## 2016-03-09 NOTE — Progress Notes (Signed)
Subjective: This morning, she denied any complaints and reported feeling better. She was concerned about her back wounds and felt guilty for letting herself develop them. I reassured her and told her we would need to come up with a plan to avoid future risk of skin breakdown.   Antibiotics:  Anti-infectives    Start     Dose/Rate Route Frequency Ordered Stop   03/09/16 1000  doxycycline (VIBRAMYCIN) 100 mg in dextrose 5 % 250 mL IVPB     100 mg 125 mL/hr over 120 Minutes Intravenous Every 12 hours 03/09/16 0851     03/09/16 0600  vancomycin (VANCOCIN) IVPB 1000 mg/200 mL premix  Status:  Discontinued     1,000 mg 200 mL/hr over 60 Minutes Intravenous Every 12 hours 03/08/16 1743 03/09/16 0850   03/08/16 1500  vancomycin (VANCOCIN) IVPB 1000 mg/200 mL premix  Status:  Discontinued     1,000 mg 200 mL/hr over 60 Minutes Intravenous Every 12 hours 03/08/16 1425 03/08/16 1644   03/07/16 1530  vancomycin (VANCOCIN) IVPB 1000 mg/200 mL premix  Status:  Discontinued     1,000 mg 200 mL/hr over 60 Minutes Intravenous Every 24 hours 03/07/16 1444 03/08/16 1425   03/07/16 1500  ceFEPIme (MAXIPIME) 1 g in dextrose 5 % 50 mL IVPB  Status:  Discontinued     1 g 100 mL/hr over 30 Minutes Intravenous Every 8 hours 03/07/16 1413 03/08/16 1644   03/04/16 0000  piperacillin-tazobactam (ZOSYN) IVPB 3.375 g  Status:  Discontinued     3.375 g 12.5 mL/hr over 240 Minutes Intravenous Every 8 hours 03/03/16 1634 03/07/16 1412   03/03/16 0800  vancomycin (VANCOCIN) 500 mg in sodium chloride 0.9 % 100 mL IVPB  Status:  Discontinued     500 mg 100 mL/hr over 60 Minutes Intravenous Every 12 hours 03/02/16 2039 03/04/16 1132   03/03/16 0300  piperacillin-tazobactam (ZOSYN) IVPB 3.375 g  Status:  Discontinued     3.375 g 12.5 mL/hr over 240 Minutes Intravenous Every 8 hours 03/02/16 2039 03/03/16 1634   03/02/16 1915  piperacillin-tazobactam (ZOSYN) IVPB 3.375 g     3.375 g 100 mL/hr over 30 Minutes  Intravenous  Once 03/02/16 1900 03/02/16 1952   03/02/16 1915  vancomycin (VANCOCIN) IVPB 1000 mg/200 mL premix     1,000 mg 200 mL/hr over 60 Minutes Intravenous  Once 03/02/16 1900 03/02/16 2052      Medications: Scheduled Meds: . collagenase   Topical Daily  . doxycycline (VIBRAMYCIN) IV  100 mg Intravenous Q12H  . enoxaparin (LOVENOX) injection  20 mg Subcutaneous Q24H  . feeding supplement (ENSURE ENLIVE)  237 mL Oral BID BM  . potassium chloride  40 mEq Oral Q4H   Continuous Infusions: . dextrose 5% lactated ringers     PRN Meds:.acetaminophen **OR** acetaminophen, bisacodyl, HYDROcodone-acetaminophen, ondansetron **OR** ondansetron (ZOFRAN) IV, polyethylene glycol, RESOURCE THICKENUP CLEAR   Objective: Weight change:   Intake/Output Summary (Last 24 hours) at 03/09/16 1813 Last data filed at 03/09/16 1648  Gross per 24 hour  Intake             3048 ml  Output             1800 ml  Net             1248 ml   Blood pressure 110/70, pulse (!) 109, temperature 98.7 F (37.1 C), temperature source Oral, resp. rate 18, height 5\' 9"  (1.753 m), weight 95 lb (43.1  kg), last menstrual period 01/28/2016, SpO2 100 %. Temp:  [98.7 F (37.1 C)-99.8 F (37.7 C)] 98.7 F (37.1 C) (02/06 1444) Pulse Rate:  [98-109] 109 (02/06 1444) Resp:  [18] 18 (02/06 1444) BP: (103-110)/(64-70) 110/70 (02/06 1444) SpO2:  [97 %-100 %] 100 % (02/06 1444)  Physical Exam: General: chronically ill-appearing, malnourished, young female resting in bed, NAD HEENT: PERRL, EOMI, no scleral icterus, oropharynx clear Cardiac: RRR, no rubs, murmurs or gallops Pulm: clear to auscultation bilaterally, no wheezes, rales, or rhonchi Abd: soft, nontender, nondistended, BS present Ext: warm and well perfused, no pedal edema Neuro: responds to questions appropriately, alert and oriented x 3, unable to move bilateral LE  BMET  Recent Labs  03/08/16 0526 03/09/16 0540  NA 151* 140  K 4.2 3.0*  CL 112* 99*   CO2 31 29  GLUCOSE 94 93  BUN 9 5*  CREATININE 0.99 0.67  CALCIUM 8.1* 8.2*    Micro Results: Recent Results (from the past 720 hour(s))  Culture, blood (Routine x 2)     Status: None   Collection Time: 03/02/16  6:32 PM  Result Value Ref Range Status   Specimen Description BLOOD RIGHT FOREARM  Final   Special Requests BOTTLES DRAWN AEROBIC AND ANAEROBIC 5CC  Final   Culture NO GROWTH 5 DAYS  Final   Report Status 03/07/2016 FINAL  Final  Culture, blood (Routine x 2)     Status: None   Collection Time: 03/02/16  6:39 PM  Result Value Ref Range Status   Specimen Description BLOOD RIGHT HAND  Final   Special Requests BOTTLES DRAWN AEROBIC AND ANAEROBIC 5CC  Final   Culture NO GROWTH 5 DAYS  Final   Report Status 03/07/2016 FINAL  Final  Urine culture     Status: Abnormal   Collection Time: 03/02/16  7:29 PM  Result Value Ref Range Status   Specimen Description URINE, CATHETERIZED  Final   Special Requests NONE  Final   Culture >=100,000 COLONIES/mL ESCHERICHIA COLI (A)  Final   Report Status 03/05/2016 FINAL  Final   Organism ID, Bacteria ESCHERICHIA COLI (A)  Final      Susceptibility   Escherichia coli - MIC*    AMPICILLIN <=2 SENSITIVE Sensitive     CEFAZOLIN <=4 SENSITIVE Sensitive     CEFTRIAXONE <=1 SENSITIVE Sensitive     CIPROFLOXACIN <=0.25 SENSITIVE Sensitive     GENTAMICIN <=1 SENSITIVE Sensitive     IMIPENEM <=0.25 SENSITIVE Sensitive     NITROFURANTOIN 32 SENSITIVE Sensitive     TRIMETH/SULFA <=20 SENSITIVE Sensitive     AMPICILLIN/SULBACTAM <=2 SENSITIVE Sensitive     PIP/TAZO <=4 SENSITIVE Sensitive     Extended ESBL NEGATIVE Sensitive     * >=100,000 COLONIES/mL ESCHERICHIA COLI  Respiratory Panel by PCR     Status: None   Collection Time: 03/07/16  5:08 PM  Result Value Ref Range Status   Adenovirus NOT DETECTED NOT DETECTED Final   Coronavirus 229E NOT DETECTED NOT DETECTED Final   Coronavirus HKU1 NOT DETECTED NOT DETECTED Final   Coronavirus NL63  NOT DETECTED NOT DETECTED Final   Coronavirus OC43 NOT DETECTED NOT DETECTED Final   Metapneumovirus NOT DETECTED NOT DETECTED Final   Rhinovirus / Enterovirus NOT DETECTED NOT DETECTED Final   Influenza A NOT DETECTED NOT DETECTED Final   Influenza B NOT DETECTED NOT DETECTED Final   Parainfluenza Virus 1 NOT DETECTED NOT DETECTED Final   Parainfluenza Virus 2 NOT DETECTED NOT DETECTED Final  Parainfluenza Virus 3 NOT DETECTED NOT DETECTED Final   Parainfluenza Virus 4 NOT DETECTED NOT DETECTED Final   Respiratory Syncytial Virus NOT DETECTED NOT DETECTED Final   Bordetella pertussis NOT DETECTED NOT DETECTED Final   Chlamydophila pneumoniae NOT DETECTED NOT DETECTED Final   Mycoplasma pneumoniae NOT DETECTED NOT DETECTED Final  Urine culture     Status: None   Collection Time: 03/07/16  5:08 PM  Result Value Ref Range Status   Specimen Description URINE, CATHETERIZED  Final   Special Requests NONE  Final   Culture NO GROWTH  Final   Report Status 03/08/2016 FINAL  Final  Culture, blood (routine x 2)     Status: None (Preliminary result)   Collection Time: 03/07/16  5:58 PM  Result Value Ref Range Status   Specimen Description BLOOD RIGHT ANTECUBITAL  Final   Special Requests BOTTLES DRAWN AEROBIC AND ANAEROBIC  10CC EA  Final   Culture NO GROWTH 2 DAYS  Final   Report Status PENDING  Incomplete  Culture, blood (routine x 2)     Status: None (Preliminary result)   Collection Time: 03/07/16  6:01 PM  Result Value Ref Range Status   Specimen Description BLOOD LEFT ANTECUBITAL  Final   Special Requests BOTTLES DRAWN AEROBIC AND ANAEROBIC  [place] EA  Final   Culture NO GROWTH 2 DAYS  Final   Report Status PENDING  Incomplete    Studies/Results: No results found.    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 female with multiple sclerosis complicated by spastic paraplegia on rituximab hospitalized for acute febrile illness found to have right gluteal pyomyositis with diffuse pelvic cellulitis and myofasciitis and lymphopenia.   Right gluteal pyomyositis with diffuse pelvic cellulitis and myofasciitis: Suspect Strep/Staph as pathogens. No fevers today. Risk factors from limited mobility and possibly being in an immunocompromised state from rituximab. -Switch to doxycycline from vancomycin given lymphopenia  Lymphopenia: WBC 1.4 this morning, with ANC 700. Suspect iatrogenic, likely drug-related, as she no prior documented values this low here or in Care Everywhere. -Check vanc trough tomorrow to ensure she does have elevated serum level -Trend CBC & differential.  -If no improvement, consult Hematology before restarting filgrastim. Order blood smear for review.   LOS: 7 days   Deanna Baldwin 03/09/2016, 6:13 PM

## 2016-03-09 NOTE — Progress Notes (Signed)
MD paged about K 3.0

## 2016-03-09 NOTE — Progress Notes (Signed)
Daily Progress Note   Patient Name: Deanna Baldwin       Date: 03/09/2016 DOB: 1975-08-31  Age: 41 y.o. MRN#: [id number] Attending Physician: Tawni Millers, * Primary Care Physician: [name], MD Admit Date: 03/02/2016  Reason for Consultation/Follow-up: Establishing goals of care and Psychosocial/spiritual support  Subjective:  -continued conversation with patient at bedside concerning her current medical condition   -education offered regarding the natural trajectory of MS and expected sequelae of disease  -emotional support offered, patient engaged and shared her life story as it relates to her diagnosis, and its effects on her life.  Length of Stay: 7  Current Medications: Scheduled Meds:  . collagenase   Topical Daily  . doxycycline (VIBRAMYCIN) IV  100 mg Intravenous Q12H  . enoxaparin (LOVENOX) injection  20 mg Subcutaneous Q24H  . feeding supplement (ENSURE ENLIVE)  237 mL Oral BID BM    Continuous Infusions: . dextrose 5 % 1,000 mL infusion 100 mL/hr at 03/09/16 0227    PRN Meds: acetaminophen **OR** acetaminophen, bisacodyl, HYDROcodone-acetaminophen, ondansetron **OR** ondansetron (ZOFRAN) IV, polyethylene glycol, RESOURCE THICKENUP CLEAR  Physical Exam  Constitutional: She appears cachectic. She appears ill.  Cardiovascular: Tachycardia present.   Pulmonary/Chest: She has decreased breath sounds in the right lower field and the left lower field.  Musculoskeletal:  -generalized weakness and atrophy   Skin: Skin is warm and dry.  - noted sacral wounds per EMR            Vital Signs: BP 103/64 (BP Location: Left Arm)   Pulse (!) 106   Temp 99.8 F (37.7 C) (Oral)   Resp 18   Ht 5\' 9"  (1.753 m)   Wt 43.1 kg (95 lb)   LMP 01/28/2016   SpO2 97%    BMI 14.03 kg/m  SpO2: SpO2: 97 % O2 Device: O2 Device: Not Delivered O2 Flow Rate: O2 Flow Rate (L/min): 2 L/min  Intake/output summary:  Intake/Output Summary (Last 24 hours) at 03/09/16 1011 Last data filed at 03/09/16 0700  Gross per 24 hour  Intake             1860 ml  Output             2100 ml  Net             -240 ml  LBM: Last BM Date: 03/08/16 Baseline Weight: Weight: 49.9 kg (110 lb) Most recent weight: Weight: 43.1 kg (95 lb)       Palliative Assessment/Data:   30 % at best    Flowsheet Rows   Flowsheet Row Most Recent Value  Intake Tab  Referral Department  Hospitalist  Unit at Time of Referral  Med/Surg Unit  Palliative Care Primary Diagnosis  Sepsis/Infectious Disease  Date Notified  03/07/16  Palliative Care Type  New Palliative care  Reason for referral  Clarify Goals of Care  Date of Admission  03/02/16  # of days IP prior to Palliative referral  5  Clinical Assessment  Psychosocial & Spiritual Assessment  Palliative Care Outcomes      Patient Active Problem List   Diagnosis Date Noted  . Muscle abscess   . Myofascitis   . Sepsis secondary to UTI ([place])   . Aspiration pneumonia of left lower lobe due to vomit ([place])   . Sacral decubitus ulcer, stage III ([place])   . Multiple sclerosis ([place])   . Protein-calorie malnutrition, severe ([place])   . Hypokalemia 03/05/2016  . Hypernatremia   . Pressure injury of skin 03/03/2016  . 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 Severe sepsis ([place]) 03/02/2016  . Community acquired pneumonia 03/02/2016  . UTI (urinary tract infection) 03/02/2016  . Abnormal LFTs 03/02/2016  . Buttock wound 03/02/2016  . Protein calorie malnutrition ([place]) 03/02/2016    Palliative Care Assessment & Plan   Patient Profile: 40 y.o. female  admitted on 03/02/2016 with PMH significant for MS with paraplegia/ she is followed at [facility].  Admitted with altered mental status and fever.  She lives at  home with her 40 yo son, Deanna Baldwin.  She has no in home help.   She is wheel-chair bound, found with sacral wounds  In ER with positive urine culture and chest x-ray positive for pneumonia.  She is frail, with dysphagia and severe protein caloria malnutrition/albumin 1.7.   She currently is receiving rituximab every 6 months, which may be influencing an immunocompromised state.    MRI pelvis with multiple soft tissue collections, with myositis right gluteal region, suspected pyomyositis, ID consulted  Patient faces many major decisions specific to viable treatment options and anticipatory care needs for herself and her young son.  Assessment:  This is the first hospitalization for this patient  with significant disease 2/2 MS diagnosis.  It is somewhat surprising to her that we are talking about concepts specific/AD decsions to and actually expected with progression of MS such as  artifical feeding and hydration  Recommendations/Plan:  Treat the treatable, hopeful for improvement and eventual retrun home  Goals of Care and Additional Recommendations:  Open to all offered and available medical interventions to prolong life   Code Status:    Code Status Orders        Start     Ordered   03/02/16 2207  Full code  Continuous     03/02/16 2211    Code Status History    Date Active Date Inactive Code Status Order ID Comments User Context   This patient has a current code status but no historical code status.       Prognosis:   Unable to determine  Discharge Planning:  [facility] for rehab with Palliative care service follow-up  Care plan was discussed with Dr Cathlean Sauer  Thank you for allowing the Palliative Medicine Team to assist in the care  of this patient.   Time In: 0830 Time Out: 0930 Total Time 60 min Prolonged Time Billed  no      Greater than 50%  of this time was spent counseling and coordinating care related to the above assessment and  plan.  Wadie Lessen, NP  Please contact Palliative Medicine Team phone at [phone] for questions and concerns.

## 2016-03-09 NOTE — Progress Notes (Signed)
PROGRESS NOTE    Deanna Baldwin  [id number] DOB: 21-Nov-1975 DOA: 03/02/2016 PCP: [name], MD    Brief Narrative:  41 yo female, with multiple sclerosis presents with altered mentation and fever. Recent respiratory illness. Found by patient's family lethargic with temperature 104. On the initial evaluation found to be septic. Positive urine infection and bibasilar pulmonary infiltrates. Developed worsening sepsis cw leukopenia and elevated lactic acid, antibiotics have been up-escalated. MRI pelvis with multiple soft tissue collections, with myositis right gluteal region, suspected pyomyositis, largest collection 2 cm. Not candidate for IR drainage, currently on conservative therapy.   Assessment & Plan:   Principal Problem:   Severe sepsis ([place]) Active Problems:   Spastic 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 ([place])   Community acquired pneumonia   UTI (urinary tract infection)   Abnormal LFTs   Buttock wound   Protein calorie malnutrition ([facility])   Pressure injury of skin   Hypokalemia   Hypernatremia   Sepsis secondary to UTI ([place])   Aspiration pneumonia of left lower lobe due to vomit ([place])   Sacral decubitus ulcer, stage III ([facility])   Multiple sclerosis ([facility])   Protein-calorie malnutrition, severe ([facility])   Muscle abscess   Myofascitis   1. Sepsis due to urine infection, present on admission.  Urine culture positive for e coli pan sensitive. Patient had 6 days of therapy.   2. Pneumonia bilateral, possible aspiration, present on admission. Aspiration precautions, continue dysphagia 3, mechanical soft with honey thick liquids. Oxymetry monitoring and supplemental 02 per [place] to target 02 sat above 92%.  3. Pressure ulcers, sacral, present on admission, stage 2 to 3, multiple. Will continue conservative therapy with IV antibiotics for pelvic myofascitis and gluteal pyomyositis. WBC ar 1.9. Continue vancomycin and doxycylcine.   4. Multiple sclerosis. Patient has loss  follow up for MS at tertiary care center. Family requesting re-evaluation for neurology on this admission. Will need help at home.   5. Protein calorie malnutrition. BMI 16. Very deconditioned, palliative care team consulted. Per nutrition ensure bid and magic cup tid.   6. Hypernatremia. Continue hydration with d5LR  at 75 cc per hour, for maintenance fluids. Follow renal panel in am, avoid hypotension or nephrotoxic medications.  7. Hypokalemia. K at 3.0, will replete with kcl 40 mew x2 doses and follow renal panel in am.   DVT prophylaxis:enoxaparin Code Status:full  Family Communication:No family at the bedside  Disposition Plan:home   Consultants:  Wound care  Procedures:   Antimicrobials:   Vancomycin  Subjective: Patient feeling very weak, no nausea or vomiting, no chest pain or dyspnea.   Objective: Vitals:   03/08/16 1628 03/08/16 2215 03/09/16 0644 03/09/16 1444  BP:  103/68 103/64 110/70  Pulse:  98 (!) 106 (!) 109  Resp:  18 18 18   Temp: 98.7 F (37.1 C) 99.1 F (37.3 C) 99.8 F (37.7 C) 98.7 F (37.1 C)  TempSrc: Oral Oral Oral Oral  SpO2:  97% 97% 100%  Weight:      Height:        Intake/Output Summary (Last 24 hours) at 03/09/16 1727 Last data filed at 03/09/16 1648  Gross per 24 hour  Intake             3048 ml  Output             1800 ml  Net             1248 ml   Filed Weights   03/06/16 0352 03/07/16 0500  03/08/16 0500  Weight: 36.7 kg (81 lb) 44.5 kg (98 lb) 43.1 kg (95 lb)    Examination:  General exam: deconditioned E ENT; no pallor or icterus, oral mucosa dry.  Respiratory system: Clear to auscultation. Respiratory effort normal. Decreased breath sound at bases.  Cardiovascular system: S1 & S2 heard, RRR. No JVD, murmurs, rubs, gallops or clicks. No pedal edema. Gastrointestinal system: Abdomen is nondistended, soft and nontender. No organomegaly or masses felt. Normal bowel sounds heard. Central nervous system: Alert  and oriented. No focal neurological deficits. Extremities: Symmetric 5 x 5 power. Skin: multiple stage 2 and 3 pressure ulcers at the pelvic region.   Data Reviewed: I have personally reviewed following labs and imaging studies  CBC:  Recent Labs Lab 03/03/16 0505 03/04/16 0433  03/06/16 0602 03/07/16 0840 03/07/16 1210 03/08/16 0526 03/09/16 0540  WBC 14.2* 6.1  < > 2.3* 1.8* 1.8* 3.5* 1.9*  NEUTROABS 12.6* 5.0  --   --   --  1.0* 2.7 0.7*  HGB 9.6* 9.0*  < > 8.1* 9.3* 9.6* 9.1* 8.7*  HCT 32.0* 30.2*  < > 26.8* 31.0* 32.8* 30.8* 29.0*  MCV 91.7 92.1  < > 93.4 92.3 92.7 92.5 90.9  PLT 419* 424*  < > 322 299 322 295 267  < > = values in this interval not displayed. Basic Metabolic Panel:  Recent Labs Lab 03/05/16 0800 03/06/16 0602 03/07/16 0840 03/08/16 0526 03/09/16 0540  NA 158* 150* 143 151* 140  K 3.5 2.8* 3.2* 4.2 3.0*  CL 121* 113* 103 112* 99*  CO2 27 29 28 31 29   GLUCOSE 110* 119* 112* 94 93  BUN 13 7 9 9  5*  CREATININE 0.87 0.79 0.99 0.99 0.67  CALCIUM 8.2* 7.9* 7.4* 8.1* 8.2*  MG 2.3  --  2.0 1.8 1.5*   GFR: Estimated Creatinine Clearance: 64.2 mL/min (by C-G formula based on SCr of 0.67 mg/dL). Liver Function Tests:  Recent Labs Lab 03/02/16 1832 03/03/16 0505  AST 113* 84*  ALT 55* 42  ALKPHOS 118 90  BILITOT 1.7* 0.5  PROT 6.2* 5.2*  ALBUMIN 2.2* 1.7*   No results for input(s): LIPASE, AMYLASE in the last 168 hours. No results for input(s): AMMONIA in the last 168 hours. Coagulation Profile:  Recent Labs Lab 03/02/16 1832  INR 1.28   Cardiac Enzymes:  Recent Labs Lab 03/02/16 2300  TROPONINI <0.03   BNP (last 3 results) No results for input(s): PROBNP in the last 8760 hours. HbA1C: No results for input(s): HGBA1C in the last 72 hours. CBG:  Recent Labs Lab 03/04/16 0810 03/05/16 0813 03/06/16 0806 03/07/16 0802 03/08/16 0800  GLUCAP 110* 102* 98 115* 115*   Lipid Profile: No results for input(s): CHOL, HDL,  LDLCALC, TRIG, CHOLHDL, LDLDIRECT in the last 72 hours. Thyroid Function Tests: No results for input(s): TSH, T4TOTAL, FREET4, T3FREE, THYROIDAB in the last 72 hours. Anemia Panel: No results for input(s): VITAMINB12, FOLATE, FERRITIN, TIBC, IRON, RETICCTPCT in the last 72 hours. Sepsis Labs:  Recent Labs Lab 03/02/16 2024 03/02/16 2252 03/07/16 1210 03/08/16 1541  PROCALCITON  --  2.25  --   --   LATICACIDVEN 1.3 1.1 2.5* 2.7*    Recent Results (from the past 240 hour(s))  Culture, blood (Routine x 2)     Status: None   Collection Time: 03/02/16  6:32 PM  Result Value Ref Range Status   Specimen Description BLOOD RIGHT FOREARM  Final   Special Requests BOTTLES DRAWN AEROBIC AND  ANAEROBIC 5CC  Final   Culture NO GROWTH 5 DAYS  Final   Report Status 03/07/2016 FINAL  Final  Culture, blood (Routine x 2)     Status: None   Collection Time: 03/02/16  6:39 PM  Result Value Ref Range Status   Specimen Description BLOOD RIGHT HAND  Final   Special Requests BOTTLES DRAWN AEROBIC AND ANAEROBIC 5CC  Final   Culture NO GROWTH 5 DAYS  Final   Report Status 03/07/2016 FINAL  Final  Urine culture     Status: Abnormal   Collection Time: 03/02/16  7:29 PM  Result Value Ref Range Status   Specimen Description URINE, CATHETERIZED  Final   Special Requests NONE  Final   Culture >=100,000 COLONIES/mL ESCHERICHIA COLI (A)  Final   Report Status 03/05/2016 FINAL  Final   Organism ID, Bacteria ESCHERICHIA COLI (A)  Final      Susceptibility   Escherichia coli - MIC*    AMPICILLIN <=2 SENSITIVE Sensitive     CEFAZOLIN <=4 SENSITIVE Sensitive     CEFTRIAXONE <=1 SENSITIVE Sensitive     CIPROFLOXACIN <=0.25 SENSITIVE Sensitive     GENTAMICIN <=1 SENSITIVE Sensitive     IMIPENEM <=0.25 SENSITIVE Sensitive     NITROFURANTOIN 32 SENSITIVE Sensitive     TRIMETH/SULFA <=20 SENSITIVE Sensitive     AMPICILLIN/SULBACTAM <=2 SENSITIVE Sensitive     PIP/TAZO <=4 SENSITIVE Sensitive     Extended ESBL  NEGATIVE Sensitive     * >=100,000 COLONIES/mL ESCHERICHIA COLI  Respiratory Panel by PCR     Status: None   Collection Time: 03/07/16  5:08 PM  Result Value Ref Range Status   Adenovirus NOT DETECTED NOT DETECTED Final   Coronavirus 229E NOT DETECTED NOT DETECTED Final   Coronavirus HKU1 NOT DETECTED NOT DETECTED Final   Coronavirus NL63 NOT DETECTED NOT DETECTED Final   Coronavirus OC43 NOT DETECTED NOT DETECTED Final   Metapneumovirus NOT DETECTED NOT DETECTED Final   Rhinovirus / Enterovirus NOT DETECTED NOT DETECTED Final   Influenza A NOT DETECTED NOT DETECTED Final   Influenza B NOT DETECTED NOT DETECTED Final   Parainfluenza Virus 1 NOT DETECTED NOT DETECTED Final   Parainfluenza Virus 2 NOT DETECTED NOT DETECTED Final   Parainfluenza Virus 3 NOT DETECTED NOT DETECTED Final   Parainfluenza Virus 4 NOT DETECTED NOT DETECTED Final   Respiratory Syncytial Virus NOT DETECTED NOT DETECTED Final   Bordetella pertussis NOT DETECTED NOT DETECTED Final   Chlamydophila pneumoniae NOT DETECTED NOT DETECTED Final   Mycoplasma pneumoniae NOT DETECTED NOT DETECTED Final  Urine culture     Status: None   Collection Time: 03/07/16  5:08 PM  Result Value Ref Range Status   Specimen Description URINE, CATHETERIZED  Final   Special Requests NONE  Final   Culture NO GROWTH  Final   Report Status 03/08/2016 FINAL  Final  Culture, blood (routine x 2)     Status: None (Preliminary result)   Collection Time: 03/07/16  5:58 PM  Result Value Ref Range Status   Specimen Description BLOOD RIGHT ANTECUBITAL  Final   Special Requests BOTTLES DRAWN AEROBIC AND ANAEROBIC  10CC EA  Final   Culture NO GROWTH 2 DAYS  Final   Report Status PENDING  Incomplete  Culture, blood (routine x 2)     Status: None (Preliminary result)   Collection Time: 03/07/16  6:01 PM  Result Value Ref Range Status   Specimen Description BLOOD LEFT ANTECUBITAL  Final  Special Requests BOTTLES DRAWN AEROBIC AND ANAEROBIC  [place]  EA  Final   Culture NO GROWTH 2 DAYS  Final   Report Status PENDING  Incomplete         Radiology Studies: No results found.      Scheduled Meds: . collagenase   Topical Daily  . doxycycline (VIBRAMYCIN) IV  100 mg Intravenous Q12H  . enoxaparin (LOVENOX) injection  20 mg Subcutaneous Q24H  . feeding supplement (ENSURE ENLIVE)  237 mL Oral BID BM  . potassium chloride  40 mEq Oral Q4H   Continuous Infusions: . dextrose 5 % 1,000 mL infusion 100 mL/hr at 03/09/16 1622     LOS: 7 days      Makensie Mulhall Gerome Apley, MD [facility] Pager [phone]  If 7PM-7AM, please contact night-coverage [email or web address] Password [email or web address] 03/09/2016, 5:27 PM

## 2016-03-09 NOTE — Progress Notes (Signed)
Physical Therapy Wound Treatment Patient Details  Name: Alvis Pulcini MRN: [id number] Date of Birth: 10-27-1975  Today's Date: 03/09/2016 Time: 3143-8887 Time Calculation (min): 33 min  Subjective  Subjective: Pt pleasant and agreeable to therapy.  Patient and Family Stated Goals: Per pt for wounds to heal.   Date of Onset: 02/02/16 (pt indicates ~1 month ago.) Prior Treatments: None  Pain Score: Pain Score: 0-No pain  Wound Assessment  Pressure Injury 03/03/16 Unstageable - Full thickness tissue loss in which the base of the ulcer is covered by slough (yellow, tan, gray, green or brown) and/or eschar (tan, brown or black) in the wound bed. greater than 20 full thickness lesions  (Active)  Dressing Type Moist to dry 03/09/2016 10:15 AM  Dressing Clean;Dry;Intact 03/09/2016 10:15 AM  Dressing Change Frequency Daily 03/09/2016 10:15 AM  Site / Wound Assessment Dressing in place / Unable to assess 03/09/2016 10:15 AM  Wound Length (cm) 6 cm 03/03/2016  1:00 PM  Wound Width (cm) 6 cm 03/03/2016  1:00 PM  Drainage Amount Moderate 03/04/2016  7:56 PM     Wound / Incision (Open or Dehisced) 03/04/16 Non-pressure wound Sacrum Largest wound over sacrum (Active)  Dressing Type Moist to dry 03/09/2016  2:19 PM  Dressing Changed Changed 03/08/2016 12:41 PM  Dressing Status Clean;Dry;Intact 03/09/2016  2:19 PM  Dressing Change Frequency Daily 03/09/2016  2:19 PM  Site / Wound Assessment Granulation tissue 03/09/2016  2:19 PM  % Wound base Red or Granulating 55% 03/09/2016  2:19 PM  % Wound base Yellow/Fibrinous Exudate 45% 03/09/2016  2:19 PM  Peri-wound Assessment Maceration;Erythema (blanchable) 03/09/2016  2:19 PM  Wound Length (cm) 4.5 cm 03/04/2016 12:00 PM  Wound Width (cm) 6.4 cm 03/04/2016 12:00 PM  Margins Unattached edges (unapproximated) 03/09/2016  2:19 PM  Closure None 03/09/2016  2:19 PM  Drainage Amount Minimal 03/09/2016  2:19 PM  Drainage Description Serosanguineous 03/09/2016  2:19 PM  Treatment Hydrotherapy  (Pulse lavage);Packing (Saline gauze) 03/09/2016  2:19 PM     Wound / Incision (Open or Dehisced) 03/08/16 Non-pressure wound Back Superior to sacral wound (Active)  Dressing Type ABD;Barrier Film (skin prep);Gauze (Comment);Moist to dry 03/09/2016  2:19 PM  Dressing Changed New 03/09/2016  2:19 PM  Dressing Status Clean;Dry;Intact 03/09/2016  2:19 PM  Dressing Change Frequency Daily 03/09/2016  2:19 PM  Site / Wound Assessment Yellow;Black 03/09/2016  2:19 PM  % Wound base Red or Granulating 10% 03/09/2016  2:19 PM  % Wound base Yellow/Fibrinous Exudate 30% 03/09/2016  2:19 PM  % Wound base Black/Eschar 60% 03/09/2016  2:19 PM  % Wound base Other/Granulation Tissue (Comment) 0% 03/09/2016  2:19 PM  Peri-wound Assessment Maceration;Intact 03/09/2016  2:19 PM  Wound Length (cm) 2.2 cm 03/08/2016 12:41 PM  Wound Width (cm) 1 cm 03/08/2016 12:41 PM  Wound Depth (cm) 0.2 cm 03/08/2016 12:41 PM  Margins Unattached edges (unapproximated) 03/09/2016  2:19 PM  Closure None 03/09/2016  2:19 PM  Drainage Amount Minimal 03/09/2016  2:19 PM  Drainage Description Serosanguineous 03/09/2016  2:19 PM  Treatment Debridement (Selective);Hydrotherapy (Pulse lavage);Packing (Saline gauze) 03/08/2016 12:41 PM   Hydrotherapy Pulsed lavage therapy - wound location: Sacrum and wound superior to Pulsed Lavage with Suction (psi): 12 psi Pulsed Lavage with Suction - Normal Saline Used: 1000 mL Pulsed Lavage Tip: Tip with splash shield Selective Debridement Selective Debridement - Location: Sacrum Selective Debridement - Tools Used: Scalpel;Forceps (from superior wound to sacrum) Selective Debridement - Tissue Removed: yellow and black necrotic tissue  Wound Assessment and Plan  Wound Therapy - Assess/Plan/Recommendations Wound Therapy - Clinical Statement: Pt remains with remnants of BM upon arrival.  Pt remains to benefit from hyrdo therapy to improve wound healing to improve skin integrity.   Wound Therapy - Functional Problem List:  Incontinence, decreased mobility Factors Delaying/Impairing Wound Healing: Altered sensation;Incontinence;Infection - systemic/local;Immobility;Multiple medical problems Hydrotherapy Plan: Debridement;Patient/family education;Pulsatile lavage with suction;Dressing change Wound Therapy - Frequency: 6X / week Wound Therapy - Current Recommendations: OT;PT;Case manager/social work Wound Therapy - Follow Up Recommendations: Skilled nursing facility Wound Plan: See Above  Wound Therapy Goals- Improve the function of patient's integumentary system by progressing the wound(s) through the phases of wound healing (inflammation - proliferation - remodeling) by: Decrease Necrotic Tissue to: 20 (Both wounds) Decrease Necrotic Tissue - Progress: Progressing toward goal Increase Granulation Tissue to: 80 (Both wounds) Increase Granulation Tissue - Progress: Progressing toward goal Goals/treatment plan/discharge plan were made with and agreed upon by patient/family: Yes Time For Goal Achievement: 7 days Wound Therapy - Potential for Goals: Good  Goals will be updated until maximal potential achieved or discharge criteria met.  Discharge criteria: when goals achieved, discharge from hospital, MD decision/surgical intervention, no progress towards goals, refusal/missing three consecutive treatments without notification or medical reason.  GP     Yonna Alwin Eli Hose 03/09/2016, 2:26 PM Governor Rooks, PTA pager [phone]

## 2016-03-09 NOTE — Care Management Important Message (Signed)
Important Message  Patient Details  Name: Deanna Baldwin MRN: [id number] Date of Birth: 01/02/1976   Medicare Important Message Given:  Yes    Nathen May 03/09/2016, 12:57 PM

## 2016-03-09 NOTE — Progress Notes (Signed)
      INFECTIOUS DISEASE ATTENDING ADDENDUM:   Date: 03/09/2016  Patient name: O[place]e record number: [id number]  Date of birth: 1975/04/06    This patient has been seen and discussed with the house staff. Please see the resident's note for complete details. I concur with his  findings with the following additions/corrections:  Ms Vanhall looks which improved today compared to yesterday when I saw her. Her white count has dropped again profoundly. I worry about vancomycin-induced leukopenia.  We will therefore stop her vancomycin and use doxycycline and antimicrobial that she has not been on recently and which would cover MRSA and MSSA in the fluid collections in her abdomen. She is breathing much better and has clear lungs at present.   If her leukopenia continues to fail to respond to discontinuing antimicrobial therapy I would formally consult hematology and also look at different other medications she is on that could be potentially toxic to her bone marrow.  I would not administer filgrastim since it will obfuscate the cause.     Rhina Brackett [place] 03/09/2016, 6:09 PM

## 2016-03-10 DIAGNOSIS — A419 Sepsis, unspecified organism: Secondary | ICD-10-CM

## 2016-03-10 DIAGNOSIS — D708 Other neutropenia: Secondary | ICD-10-CM

## 2016-03-10 DIAGNOSIS — Z515 Encounter for palliative care: Secondary | ICD-10-CM

## 2016-03-10 DIAGNOSIS — R6520 Severe sepsis without septic shock: Secondary | ICD-10-CM

## 2016-03-10 LAB — BASIC METABOLIC PANEL
ANION GAP: 14 (ref 5–15)
ANION GAP: 10 (ref 5–15)
CALCIUM: 8.3 mg/dL — AB (ref 8.9–10.3)
CO2: 25 mmol/L (ref 22–32)
CO2: 27 mmol/L (ref 22–32)
CREATININE: 0.5 mg/dL (ref 0.44–1.00)
Calcium: 8.9 mg/dL (ref 8.9–10.3)
Chloride: 102 mmol/L (ref 101–111)
Chloride: 102 mmol/L (ref 101–111)
Creatinine, Ser: 0.47 mg/dL (ref 0.44–1.00)
GFR calc Af Amer: >60 mL/min (ref >60)
GFR calc Af Amer: >60 mL/min (ref >60)
GFR calc non Af Amer: >60 mL/min (ref >60)
GLUCOSE: 98 mg/dL (ref 65–99)
GLUCOSE: 132 mg/dL — AB (ref 65–99)
POTASSIUM: 3.5 mmol/L (ref 3.5–5.1)
Potassium: 3.3 mmol/L — ABNORMAL LOW (ref 3.5–5.1)
Sodium: 141 mmol/L (ref 135–145)
Sodium: 139 mmol/L (ref 135–145)

## 2016-03-10 LAB — CBC WITH DIFFERENTIAL/PLATELET
BLASTS: 0 %
Band Neutrophils: 0 %
Basophils Absolute: 0 10*3/uL (ref 0.0–0.1)
Basophils Relative: 0 %
Eosinophils Absolute: 0.1 10*3/uL (ref 0.0–0.7)
Eosinophils Relative: 3 %
HEMATOCRIT: 30.3 % — AB (ref 36.0–46.0)
Hemoglobin: 9.1 g/dL — ABNORMAL LOW (ref 12.0–15.0)
LYMPHS PCT: 74 %
Lymphs Abs: 1.4 10*3/uL (ref 0.7–4.0)
MCH: 26.9 pg (ref 26.0–34.0)
MCHC: 30 g/dL (ref 30.0–36.0)
MCV: 89.6 fL (ref 78.0–100.0)
MONOS PCT: 15 %
Metamyelocytes Relative: 0 %
Monocytes Absolute: 0.3 10*3/uL (ref 0.1–1.0)
Myelocytes: 0 %
NEUTROS ABS: 0.2 10*3/uL — AB (ref 1.7–7.7)
NRBC: 0 /100{WBCs}
Neutrophils Relative %: 8 %
OTHER: 0 %
PLATELETS: 297 10*3/uL (ref 150–400)
Promyelocytes Absolute: 0 %
RBC: 3.38 MIL/uL — AB (ref 3.87–5.11)
RDW: 13.3 % (ref 11.5–15.5)
WBC: 2 10*3/uL — AB (ref 4.0–10.5)

## 2016-03-10 LAB — VANCOMYCIN, TROUGH
Vancomycin Tr: 5 ug/mL — ABNORMAL LOW (ref 15–20)
Vancomycin Tr: 4 ug/mL — ABNORMAL LOW (ref 15–20)

## 2016-03-10 LAB — MAGNESIUM
Magnesium: 1.5 mg/dL — ABNORMAL LOW (ref 1.7–2.4)
Magnesium: 1.4 mg/dL — ABNORMAL LOW (ref 1.7–2.4)

## 2016-03-10 LAB — VANCOMYCIN, RANDOM: VANCOMYCIN RM: 5

## 2016-03-10 NOTE — Progress Notes (Signed)
PROGRESS NOTE    Deanna Baldwin  [id number] DOB: [birth year] DOA: 03/02/2016 PCP: [name], MD    Brief Narrative:  41 yo female, with multiple sclerosis presents with altered mentation and fever. Recent respiratory illness. Found by patient's family lethargic with temperature 104. On the initial evaluation found to be septic. Positive urine infection and bibasilar pulmonary infiltrates. Developed worsening sepsis cw leukopenia and elevated lactic acid, antibiotics have been up-escalated. MRI pelvis with multiple soft tissue collections, with myositis right gluteal region, suspected pyomyositis, largest collection 2 cm. Not candidate for IR drainage, currently on conservative therapy.   Assessment & Plan:   Principal Problem:   Severe sepsis ([place]) Active Problems:   Spastic 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 ([name])   Community acquired pneumonia   UTI (urinary tract infection)   Abnormal LFTs   Buttock wound   Protein calorie malnutrition ([facility])   Pressure injury of skin   Hypokalemia   Hypernatremia   Sepsis secondary to UTI ([place])   Aspiration pneumonia of left lower lobe due to vomit ([place])   Sacral decubitus ulcer, stage III (HCC)   Multiple sclerosis (H[facility])   Protein-calorie malnutrition, severe ([place])   Muscle abscess   Myofascitis   Palliative care by specialist   Other neutropenia ([place])   1. Sepsis due to urine infection, present on admission.  Urine culture positive for e coli pan sensitive. Patient had 6 days of therapy.   2. Pneumonia bilateral, possible aspiration, present on admission. Aspiration precautions, continue dysphagia 3, mechanical soft with honey thick liquids. Oxymetry monitoring and supplemental 02 per [place] to target 02 sat above 92%.  3. Pressure ulcers, sacral, present on admission, stage 2 to 3, multiple. Will continue conservative therapy with IV antibiotics for pelvic myofascitis and gluteal pyomyositis. WBC ar 1.9. Continue vancomycin and  doxycylcine.   4. Multiple sclerosis. Patient has loss follow up for MS at tertiary care center. Family requesting re-evaluation for neurology on this admission. Will need help at home.   5. Protein calorie malnutrition. BMI 16. Very deconditioned, palliative care team consulted. Per nutrition ensure bid and magic cup tid.   6. Hypernatremia. Continue hydration with d5LR  at 75 cc per hour, for maintenance fluids. Follow renal panel in am, avoid hypotension or nephrotoxic medications.  7. Hypokalemia. K at 3.0, will replete with kcl 40 mew x2 doses and follow renal panel in am.   DVT prophylaxis:enoxaparin Code Status:full  Family Communication:No family at the bedside  Disposition Plan:SNF  Consultants:  Wound care  Infectious disease  Palliative care  Procedures: hydrotherapy   Antimicrobials:   Vancomycin then doxycyclin  Subjective: Patient feeling very weak, no nausea or vomiting, no chest pain or dyspnea.   Objective: Vitals:   03/09/16 1444 03/09/16 2336 03/10/16 0709 03/10/16 1412  BP: 110/70  104/65 102/67  Pulse: (!) 109 (!) 103 99 (!) 117  Resp: 18 20 18 18   Temp: 98.7 F (37.1 C) 99.9 F (37.7 C) 99 F (37.2 C) 98.1 F (36.7 C)  TempSrc: Oral Oral Oral Oral  SpO2: 100% 95% 100% 99%  Weight:      Height:        Intake/Output Summary (Last 24 hours) at 03/10/16 2100 Last data filed at 03/10/16 1700  Gross per 24 hour  Intake             2263 ml  Output             2125 ml  Net  138 ml   Filed Weights   03/06/16 0352 03/07/16 0500 03/08/16 0500  Weight: 36.7 kg (81 lb) 44.5 kg (98 lb) 43.1 kg (95 lb)    Examination:  General exam: deconditioned, very frail, thin, malnourished  E ENT; no pallor or icterus, oral mucosa dry.  Respiratory system: Clear to auscultation. Respiratory effort normal. Decreased breath sound at bases.  Cardiovascular system: S1 & S2 heard, RRR. No JVD, murmurs, rubs, gallops or clicks. No  pedal edema. Gastrointestinal system: Abdomen is nondistended, soft and nontender. No organomegaly or masses felt. Normal bowel sounds heard. Central nervous system: Alert and oriented. No focal neurological deficits. Extremities: Symmetric 5 x 5 power. Skin: multiple stage 2 and 3 pressure ulcers at the pelvic region.   Data Reviewed: I have personally reviewed following labs and imaging studies  CBC:  Recent Labs Lab 03/04/16 0433  03/07/16 0840 03/07/16 1210 03/08/16 0526 03/09/16 0540 03/10/16 1146  WBC 6.1  < > 1.8* 1.8* 3.5* 1.9* 2.0*  NEUTROABS 5.0  --   --  1.0* 2.7 0.7* 0.2*  HGB 9.0*  < > 9.3* 9.6* 9.1* 8.7* 9.1*  HCT 30.2*  < > 31.0* 32.8* 30.8* 29.0* 30.3*  MCV 92.1  < > 92.3 92.7 92.5 90.9 89.6  PLT 424*  < > 299 322 295 267 297  < > = values in this interval not displayed. Basic Metabolic Panel:  Recent Labs Lab 03/07/16 0840 03/08/16 0526 03/09/16 0540 03/10/16 0941 03/10/16 1146  NA 143 151* 140 141 139  K 3.2* 4.2 3.0* 3.5 3.3*  CL 103 112* 99* 102 102  CO2 28 31 29 25 27   GLUCOSE 112* 94 93 98 132*  BUN 9 9 5* <5* <5*  CREATININE 0.99 0.99 0.67 0.47 0.50  CALCIUM 7.4* 8.1* 8.2* 8.9 8.3*  MG 2.0 1.8 1.5* 1.5* 1.4*   GFR: Estimated Creatinine Clearance: 64.2 mL/min (by C-G formula based on SCr of 0.5 mg/dL). Liver Function Tests: No results for input(s): AST, ALT, ALKPHOS, BILITOT, PROT, ALBUMIN in the last 168 hours. No results for input(s): LIPASE, AMYLASE in the last 168 hours. No results for input(s): AMMONIA in the last 168 hours. Coagulation Profile: No results for input(s): INR, PROTIME in the last 168 hours. Cardiac Enzymes: No results for input(s): CKTOTAL, CKMB, CKMBINDEX, TROPONINI in the last 168 hours. BNP (last 3 results) No results for input(s): PROBNP in the last 8760 hours. HbA1C: No results for input(s): HGBA1C in the last 72 hours. CBG:  Recent Labs Lab 03/04/16 0810 03/05/16 0813 03/06/16 0806 03/07/16 0802  03/08/16 0800  GLUCAP 110* 102* 98 115* 115*   Lipid Profile: No results for input(s): CHOL, HDL, LDLCALC, TRIG, CHOLHDL, LDLDIRECT in the last 72 hours. Thyroid Function Tests: No results for input(s): TSH, T4TOTAL, FREET4, T3FREE, THYROIDAB in the last 72 hours. Anemia Panel: No results for input(s): VITAMINB12, FOLATE, FERRITIN, TIBC, IRON, RETICCTPCT in the last 72 hours. Sepsis Labs:  Recent Labs Lab 03/07/16 1210 03/08/16 1541  LATICACIDVEN 2.5* 2.7*    Recent Results (from the past 240 hour(s))  Culture, blood (Routine x 2)     Status: None   Collection Time: 03/02/16  6:32 PM  Result Value Ref Range Status   Specimen Description BLOOD RIGHT FOREARM  Final   Special Requests BOTTLES DRAWN AEROBIC AND ANAEROBIC 5CC  Final   Culture NO GROWTH 5 DAYS  Final   Report Status 03/07/2016 FINAL  Final  Culture, blood (Routine x 2)  Status: None   Collection Time: 03/02/16  6:39 PM  Result Value Ref Range Status   Specimen Description BLOOD RIGHT HAND  Final   Special Requests BOTTLES DRAWN AEROBIC AND ANAEROBIC 5CC  Final   Culture NO GROWTH 5 DAYS  Final   Report Status 03/07/2016 FINAL  Final  Urine culture     Status: Abnormal   Collection Time: 03/02/16  7:29 PM  Result Value Ref Range Status   Specimen Description URINE, CATHETERIZED  Final   Special Requests NONE  Final   Culture >=100,000 COLONIES/mL ESCHERICHIA COLI (A)  Final   Report Status 03/05/2016 FINAL  Final   Organism ID, Bacteria ESCHERICHIA COLI (A)  Final      Susceptibility   Escherichia coli - MIC*    AMPICILLIN <=2 SENSITIVE Sensitive     CEFAZOLIN <=4 SENSITIVE Sensitive     CEFTRIAXONE <=1 SENSITIVE Sensitive     CIPROFLOXACIN <=0.25 SENSITIVE Sensitive     GENTAMICIN <=1 SENSITIVE Sensitive     IMIPENEM <=0.25 SENSITIVE Sensitive     NITROFURANTOIN 32 SENSITIVE Sensitive     TRIMETH/SULFA <=20 SENSITIVE Sensitive     AMPICILLIN/SULBACTAM <=2 SENSITIVE Sensitive     PIP/TAZO <=4  SENSITIVE Sensitive     Extended ESBL NEGATIVE Sensitive     * >=100,000 COLONIES/mL ESCHERICHIA COLI  Respiratory Panel by PCR     Status: None   Collection Time: 03/07/16  5:08 PM  Result Value Ref Range Status   Adenovirus NOT DETECTED NOT DETECTED Final   Coronavirus 229E NOT DETECTED NOT DETECTED Final   Coronavirus HKU1 NOT DETECTED NOT DETECTED Final   Coronavirus NL63 NOT DETECTED NOT DETECTED Final   Coronavirus OC43 NOT DETECTED NOT DETECTED Final   Metapneumovirus NOT DETECTED NOT DETECTED Final   Rhinovirus / Enterovirus NOT DETECTED NOT DETECTED Final   Influenza A NOT DETECTED NOT DETECTED Final   Influenza B NOT DETECTED NOT DETECTED Final   Parainfluenza Virus 1 NOT DETECTED NOT DETECTED Final   Parainfluenza Virus 2 NOT DETECTED NOT DETECTED Final   Parainfluenza Virus 3 NOT DETECTED NOT DETECTED Final   Parainfluenza Virus 4 NOT DETECTED NOT DETECTED Final   Respiratory Syncytial Virus NOT DETECTED NOT DETECTED Final   Bordetella pertussis NOT DETECTED NOT DETECTED Final   Chlamydophila pneumoniae NOT DETECTED NOT DETECTED Final   Mycoplasma pneumoniae NOT DETECTED NOT DETECTED Final  Urine culture     Status: None   Collection Time: 03/07/16  5:08 PM  Result Value Ref Range Status   Specimen Description URINE, CATHETERIZED  Final   Special Requests NONE  Final   Culture NO GROWTH  Final   Report Status 03/08/2016 FINAL  Final  Culture, blood (routine x 2)     Status: None (Preliminary result)   Collection Time: 03/07/16  5:58 PM  Result Value Ref Range Status   Specimen Description BLOOD RIGHT ANTECUBITAL  Final   Special Requests BOTTLES DRAWN AEROBIC AND ANAEROBIC  10CC EA  Final   Culture NO GROWTH 3 DAYS  Final   Report Status PENDING  Incomplete  Culture, blood (routine x 2)     Status: None (Preliminary result)   Collection Time: 03/07/16  6:01 PM  Result Value Ref Range Status   Specimen Description BLOOD LEFT ANTECUBITAL  Final   Special Requests  BOTTLES DRAWN AEROBIC AND ANAEROBIC  [place] EA  Final   Culture NO GROWTH 3 DAYS  Final   Report Status PENDING  Incomplete  Radiology Studies: No results found.      Scheduled Meds: . doxycycline (VIBRAMYCIN) IV  100 mg Intravenous Q12H  . enoxaparin (LOVENOX) injection  20 mg Subcutaneous Q24H  . feeding supplement (ENSURE ENLIVE)  237 mL Oral BID BM   Continuous Infusions: . dextrose 5% lactated ringers 75 mL/hr at 03/09/16 2300     LOS: 8 days      Aimie Wagman, MD PhD [facility] Pager [phone]  If 7PM-7AM, please contact night-coverage [email or web address] Password [email or web address] 03/10/2016, 9:00 PM

## 2016-03-10 NOTE — Consult Note (Signed)
[facility] Nurse wound follow up Wound type:numerous pressure injuries and moisture associated skin ulcers on the buttocks and sacrum/lower spinal area Measurement: See PT notes for recent measurements Wound bed: Seen today with hydro in follow up from our original consultation on 03/03/16.  Her wounds are all now clean, pink, moist. She has one small wound on the upper right sacral area that has about 20% yellow and 80% pink.  I have stopped enzymatic debridement ointment and hydrotherapy today for that reason. Drainage (amount, consistency, odor) minimal, serosanguinous  Periwound:intact, however this patient is very frail and with protein calorie malnutrition and is bed/wheelchair bound. Dressing procedure/placement/frequency: Silicone foam to the upper sacral wounds and buttocks.  Change every 3 days and PRN soilage. Barrier cream to the lower buttocks due to frequent bowel incontinence and contamination of dressings.  Once DC disposition has been decided patient will need low air loss mattress at home in order to prevent further breakdown.   Patient will need high level pressure redistribution cushion in her WC if she is to be up at all.  Would restrict time up in the [facility] to less than 2 hours at a time.  Maximize nutrition.  Discussed POC with patient and bedside nurse.  Re consult if needed, will not follow at this time. Thanks  Eisley Barber [employer], RN,CWOCN, CNS [phone])

## 2016-03-10 NOTE — Progress Notes (Signed)
Responded to consult to create advanced directive. Pt is vision-impaired, and had already discussed w/ her sister that her sister would be health-care agent. Explained/discussed meaning of form options w/ pt, who will discuss them w/ her sister and ask her nurse to page chaplain when she is ready to complete form. She understands that her sister does not have to be here, and that others can help be her scribe to fill out her options on the form. She said she can sign her name or initial.Pt wished to ensure she knew where turquoise-covered form was located, and she knows I placed it on the movable table then by the window (also told nurse). Provided emotional/spiritual support and prayer (latter also w/ lab tech then arriving). Chaplain available for f/u.   03/10/16 1100  Clinical Encounter Type  Visited With Patient;Health care provider  Visit Type Initial;Psychological support;Spiritual support;Social support  Referral From Nurse  Spiritual Encounters  Spiritual Needs Brochure;Prayer;Emotional  Stress Factors  Patient Stress Factors Health changes;Loss of control   Gerrit Heck, Chaplain

## 2016-03-10 NOTE — Progress Notes (Signed)
Calorie Count Note  48 hour calorie count ordered.  Pt receiving hydrotherapy at time of visit. No further calorie count data available since last visit. Used meal completion records and MAR to estimate total intake. Pt appeared to have better intake yesterday, however, pt is not meeting nutritional needs overall.   Palliative care consult pending; if pt and family still desire aggressive measures, pt would benefit from supplemental nutrition support (TF) to optimize nutritional status and assist pt in meting nutritional goals.   Diet: Dysphagia 3 diet with honey thick liquids Supplements: Ensure Enlive po BID, each supplement provides 350 kcal and 20 grams of protein  03/08/16 Breakfast: 127 kcals, 3 grams protein Lunch: 148 kcals, 5 grams protein Dinner: 194 kcals, 7 grams protein Supplements: n/a  Total intake: 469 kcal (36% of minimum estimated needs)  15 protein (27% of minimum estimated needs)  03/09/16 Breakfast: 485 kcals, 9 grams protein Lunch: 390 kcals, 13 grams protein Dinner: no information available Supplements: One Ensure Enlive per MAR (350 kcals and 20 grams protein)  Total intake: 1225 kcal (94% of minimum estimated needs)  42 protein (76% of minimum estimated needs)  AverageTotal intake: 847 kcal (65% of minimum estimated needs)  29 protein (53% of minimum estimated needs)  Nutrition Dx: Increased nutrient needsrelated to acute illness, wound healingas evidenced by increased estimated needs from protein; ongoing  Goal: Patient will meet greater than or equal to 90% of their needs; unmet  Intervention:   -Continue Ensure Enlive po BID, each supplement provides 350 kcal and 20 grams of protein (thicken to appropriate consistency) -Magic Cup TID with meals -Dependent on goals of care, pt would benefit from supplemental nutrition support (TF)  Dilana Mcphie A. Jimmye Norman, RD, LDN, CDE Pager: [phone] After hours Pager: [phone]

## 2016-03-10 NOTE — Progress Notes (Signed)
Physical Therapy Wound Treatment Patient Details  Name: Deanna Baldwin MRN: [id number] Date of Birth: 06-01-1975  Today's Date: 03/10/2016 Time: 1035-1101 Time Calculation (min): 26 min  Subjective  Subjective: Pt pleasant and agreeable to therapy.  Patient and Family Stated Goals: Per pt for wounds to heal.   Date of Onset: 02/02/16 (pt indicates ~1 month ago.) Prior Treatments: None  Pain Score:    Wound Assessment  Pressure Injury 03/03/16 Unstageable - Full thickness tissue loss in which the base of the ulcer is covered by slough (yellow, tan, gray, green or brown) and/or eschar (tan, brown or black) in the wound bed. greater than 20 full thickness lesions  (Active)  Dressing Type Moist to dry 03/09/2016 10:15 AM  Dressing Clean;Dry;Intact 03/09/2016 10:15 AM  Dressing Change Frequency Daily 03/09/2016 10:15 AM  Site / Wound Assessment Dressing in place / Unable to assess 03/09/2016  9:20 PM  Wound Length (cm) 6 cm 03/03/2016  1:00 PM  Wound Width (cm) 6 cm 03/03/2016  1:00 PM  Drainage Amount Moderate 03/04/2016  7:56 PM     Wound / Incision (Open or Dehisced) 03/04/16 Non-pressure wound Sacrum Largest wound over sacrum (Active)  Dressing Type Moist to dry 03/10/2016 12:27 PM  Dressing Changed Changed 03/08/2016 12:41 PM  Dressing Status Clean;Dry;Intact 03/10/2016 12:27 PM  Dressing Change Frequency Daily 03/10/2016 12:27 PM  Site / Wound Assessment Granulation tissue 03/10/2016 12:27 PM  % Wound base Red or Granulating 80% 03/10/2016 12:27 PM  % Wound base Yellow/Fibrinous Exudate 20% 03/10/2016 12:27 PM  Peri-wound Assessment Maceration;Erythema (blanchable) 03/10/2016 12:27 PM  Wound Length (cm) 4.5 cm 03/04/2016 12:00 PM  Wound Width (cm) 6.4 cm 03/04/2016 12:00 PM  Margins Unattached edges (unapproximated) 03/10/2016 12:27 PM  Closure None 03/10/2016 12:27 PM  Drainage Amount Minimal 03/10/2016 12:27 PM  Drainage Description Serosanguineous 03/10/2016 12:27 PM  Treatment Hydrotherapy (Pulse lavage) 03/10/2016  12:27 PM     Wound / Incision (Open or Dehisced) 03/08/16 Non-pressure wound Back Superior to sacral wound (Active)  Dressing Type ABD;Barrier Film (skin prep);Gauze (Comment);Moist to dry 03/10/2016 12:27 PM  Dressing Changed Changed 03/10/2016 12:27 PM  Dressing Status Clean;Dry;Intact 03/10/2016 12:27 PM  Dressing Change Frequency Daily 03/10/2016 12:27 PM  Site / Wound Assessment Yellow;Black 03/10/2016 12:27 PM  % Wound base Red or Granulating 40% 03/10/2016 12:27 PM  % Wound base Yellow/Fibrinous Exudate 20% 03/10/2016 12:27 PM  % Wound base Black/Eschar 40% 03/10/2016 12:27 PM  % Wound base Other/Granulation Tissue (Comment) 0% 03/10/2016 12:27 PM  Peri-wound Assessment Maceration;Intact 03/10/2016 12:27 PM  Wound Length (cm) 2.2 cm 03/08/2016 12:41 PM  Wound Width (cm) 1 cm 03/08/2016 12:41 PM  Wound Depth (cm) 0.2 cm 03/08/2016 12:41 PM  Margins Unattached edges (unapproximated) 03/10/2016 12:27 PM  Closure None 03/10/2016 12:27 PM  Drainage Amount Minimal 03/10/2016 12:27 PM  Drainage Description Serosanguineous 03/10/2016 12:27 PM  Treatment Hydrotherapy (Pulse lavage) 03/10/2016 12:27 PM   Hydrotherapy Pulsed lavage therapy - wound location: Sacrum and wound superior to Pulsed Lavage with Suction (psi): 12 psi Pulsed Lavage with Suction - Normal Saline Used: 1000 mL Pulsed Lavage Tip: Tip with splash shield   Wound Assessment and Plan  Wound Therapy - Assess/Plan/Recommendations Wound Therapy - Clinical Statement: Pt progressing well with wound care and WOC reports to d.c wound at this time from hydrotherapy services.  Wound Therapy - Functional Problem List: Incontinence, decreased mobility Factors Delaying/Impairing Wound Healing: Altered sensation;Incontinence;Infection - systemic/local;Immobility;Multiple medical problems Hydrotherapy Plan: Dressing change;Other (comment) (to d/c  wound at this time and defer to nursing.) Wound Therapy - Frequency: Other (comment) Wound Therapy - Current  Recommendations: OT;PT;Case manager/social work (d/c wound) Wound Therapy - Follow Up Recommendations: Skilled nursing facility Wound Plan: See Above  Wound Therapy Goals- Improve the function of patient's integumentary system by progressing the wound(s) through the phases of wound healing (inflammation - proliferation - remodeling) by: Decrease Necrotic Tissue to: 20 (Both wounds) Decrease Necrotic Tissue - Progress: Partly met Increase Granulation Tissue to: 80 (remains necrotic on R superior wound) Increase Granulation Tissue - Progress: Partly met Decrease Length/Width/Depth by (cm): remains necrotic on R superior wound. Goals/treatment plan/discharge plan were made with and agreed upon by patient/family: Yes Time For Goal Achievement: 7 days Wound Therapy - Potential for Goals: Good  Goals will be updated until maximal potential achieved or discharge criteria met.  Discharge criteria: when goals achieved, discharge from hospital, MD decision/surgical intervention, no progress towards goals, refusal/missing three consecutive treatments without notification or medical reason.  GP     Iza Preston Eli Hose 03/10/2016, 12:34 PM Governor Rook[place], PTA pager [phone]

## 2016-03-10 NOTE — Progress Notes (Signed)
Subjective: This morning, she reported feeling better and had no fevers overnight. Unfortunately, there was an error collecting her blood work this morning, so we had to recollect her studies.  Antibiotics:  Anti-infectives    Start     Dose/Rate Route Frequency Ordered Stop   03/09/16 1000  doxycycline (VIBRAMYCIN) 100 mg in dextrose 5 % 250 mL IVPB     100 mg 125 mL/hr over 120 Minutes Intravenous Every 12 hours 03/09/16 0851     03/09/16 0600  vancomycin (VANCOCIN) IVPB 1000 mg/200 mL premix  Status:  Discontinued     1,000 mg 200 mL/hr over 60 Minutes Intravenous Every 12 hours 03/08/16 1743 03/09/16 0850   03/08/16 1500  vancomycin (VANCOCIN) IVPB 1000 mg/200 mL premix  Status:  Discontinued     1,000 mg 200 mL/hr over 60 Minutes Intravenous Every 12 hours 03/08/16 1425 03/08/16 1644   03/07/16 1530  vancomycin (VANCOCIN) IVPB 1000 mg/200 mL premix  Status:  Discontinued     1,000 mg 200 mL/hr over 60 Minutes Intravenous Every 24 hours 03/07/16 1444 03/08/16 1425   03/07/16 1500  ceFEPIme (MAXIPIME) 1 g in dextrose 5 % 50 mL IVPB  Status:  Discontinued     1 g 100 mL/hr over 30 Minutes Intravenous Every 8 hours 03/07/16 1413 03/08/16 1644   03/04/16 0000  piperacillin-tazobactam (ZOSYN) IVPB 3.375 g  Status:  Discontinued     3.375 g 12.5 mL/hr over 240 Minutes Intravenous Every 8 hours 03/03/16 1634 03/07/16 1412   03/03/16 0800  vancomycin (VANCOCIN) 500 mg in sodium chloride 0.9 % 100 mL IVPB  Status:  Discontinued     500 mg 100 mL/hr over 60 Minutes Intravenous Every 12 hours 03/02/16 2039 03/04/16 1132   03/03/16 0300  piperacillin-tazobactam (ZOSYN) IVPB 3.375 g  Status:  Discontinued     3.375 g 12.5 mL/hr over 240 Minutes Intravenous Every 8 hours 03/02/16 2039 03/03/16 1634   03/02/16 1915  piperacillin-tazobactam (ZOSYN) IVPB 3.375 g     3.375 g 100 mL/hr over 30 Minutes Intravenous  Once 03/02/16 1900 03/02/16 1952   03/02/16 1915  vancomycin (VANCOCIN) IVPB  1000 mg/200 mL premix     1,000 mg 200 mL/hr over 60 Minutes Intravenous  Once 03/02/16 1900 03/02/16 2052      Medications: Scheduled Meds: . doxycycline (VIBRAMYCIN) IV  100 mg Intravenous Q12H  . enoxaparin (LOVENOX) injection  20 mg Subcutaneous Q24H  . feeding supplement (ENSURE ENLIVE)  237 mL Oral BID BM   Continuous Infusions: . dextrose 5% lactated ringers 75 mL/hr at 03/09/16 2300   PRN Meds:.acetaminophen **OR** acetaminophen, bisacodyl, HYDROcodone-acetaminophen, ondansetron **OR** ondansetron (ZOFRAN) IV, polyethylene glycol, RESOURCE THICKENUP CLEAR    Objective: Weight change:   Intake/Output Summary (Last 24 hours) at 03/10/16 1720 Last data filed at 03/10/16 1414  Gross per 24 hour  Intake             1188 ml  Output             2125 ml  Net             -937 ml   Blood pressure 102/67, pulse (!) 117, temperature 98.1 F (36.7 C), temperature source Oral, resp. rate 18, height 5\' 9"  (1.753 m), weight 95 lb (43.1 kg), last menstrual period 01/28/2016, SpO2 99 %. Temp:  [98.1 F (36.7 C)-99.9 F (37.7 C)] 98.1 F (36.7 C) (02/07 1412) Pulse Rate:  [99-117] 117 (02/07 1412) Resp:  [  18-20] 18 (02/07 1412) BP: (102-104)/(65-67) 102/67 (02/07 1412) SpO2:  [95 %-100 %] 99 % (02/07 1412)  Physical Exam: General: thin female, resting in bed, no acute distress HEENT: PERRL, dry, cracked lips, EOMI, no scleral icterus, oropharynx clear Cardiac: tachycardic, no rubs, murmurs or gallops Pulm: clear to auscultation bilaterally, no wheezes, rales, or rhonchi Abd: soft, nontender, nondistended, BS present Ext: warm and well perfused, no pedal edema Neuro: unable to move bilateral LE but moving both upper extremities spontaneously, alert and oriented to name, place, and year  BMET  Recent Labs  03/10/16 0941 03/10/16 1146  NA 141 139  K 3.5 3.3*  CL 102 102  CO2 25 27  GLUCOSE 98 132*  BUN <5* <5*  CREATININE 0.47 0.50  CALCIUM 8.9 8.3*    Micro  Results: Recent Results (from the past 720 hour(s))  Culture, blood (Routine x 2)     Status: None   Collection Time: 03/02/16  6:32 PM  Result Value Ref Range Status   Specimen Description BLOOD RIGHT FOREARM  Final   Special Requests BOTTLES DRAWN AEROBIC AND ANAEROBIC 5CC  Final   Culture NO GROWTH 5 DAYS  Final   Report Status 03/07/2016 FINAL  Final  Culture, blood (Routine x 2)     Status: None   Collection Time: 03/02/16  6:39 PM  Result Value Ref Range Status   Specimen Description BLOOD RIGHT HAND  Final   Special Requests BOTTLES DRAWN AEROBIC AND ANAEROBIC 5CC  Final   Culture NO GROWTH 5 DAYS  Final   Report Status 03/07/2016 FINAL  Final  Urine culture     Status: Abnormal   Collection Time: 03/02/16  7:29 PM  Result Value Ref Range Status   Specimen Description URINE, CATHETERIZED  Final   Special Requests NONE  Final   Culture >=100,000 COLONIES/mL ESCHERICHIA COLI (A)  Final   Report Status 03/05/2016 FINAL  Final   Organism ID, Bacteria ESCHERICHIA COLI (A)  Final      Susceptibility   Escherichia coli - MIC*    AMPICILLIN <=2 SENSITIVE Sensitive     CEFAZOLIN <=4 SENSITIVE Sensitive     CEFTRIAXONE <=1 SENSITIVE Sensitive     CIPROFLOXACIN <=0.25 SENSITIVE Sensitive     GENTAMICIN <=1 SENSITIVE Sensitive     IMIPENEM <=0.25 SENSITIVE Sensitive     NITROFURANTOIN 32 SENSITIVE Sensitive     TRIMETH/SULFA <=20 SENSITIVE Sensitive     AMPICILLIN/SULBACTAM <=2 SENSITIVE Sensitive     PIP/TAZO <=4 SENSITIVE Sensitive     Extended ESBL NEGATIVE Sensitive     * >=100,000 COLONIES/mL ESCHERICHIA COLI  Respiratory Panel by PCR     Status: None   Collection Time: 03/07/16  5:08 PM  Result Value Ref Range Status   Adenovirus NOT DETECTED NOT DETECTED Final   Coronavirus 229E NOT DETECTED NOT DETECTED Final   Coronavirus HKU1 NOT DETECTED NOT DETECTED Final   Coronavirus NL63 NOT DETECTED NOT DETECTED Final   Coronavirus OC43 NOT DETECTED NOT DETECTED Final    Metapneumovirus NOT DETECTED NOT DETECTED Final   Rhinovirus / Enterovirus NOT DETECTED NOT DETECTED Final   Influenza A NOT DETECTED NOT DETECTED Final   Influenza B NOT DETECTED NOT DETECTED Final   Parainfluenza Virus 1 NOT DETECTED NOT DETECTED Final   Parainfluenza Virus 2 NOT DETECTED NOT DETECTED Final   Parainfluenza Virus 3 NOT DETECTED NOT DETECTED Final   Parainfluenza Virus 4 NOT DETECTED NOT DETECTED Final   Respiratory Syncytial Virus  NOT DETECTED NOT DETECTED Final   Bordetella pertussis NOT DETECTED NOT DETECTED Final   Chlamydophila pneumoniae NOT DETECTED NOT DETECTED Final   Mycoplasma pneumoniae NOT DETECTED NOT DETECTED Final  Urine culture     Status: None   Collection Time: 03/07/16  5:08 PM  Result Value Ref Range Status   Specimen Description URINE, CATHETERIZED  Final   Special Requests NONE  Final   Culture NO GROWTH  Final   Report Status 03/08/2016 FINAL  Final  Culture, blood (routine x 2)     Status: None (Preliminary result)   Collection Time: 03/07/16  5:58 PM  Result Value Ref Range Status   Specimen Description BLOOD RIGHT ANTECUBITAL  Final   Special Requests BOTTLES DRAWN AEROBIC AND ANAEROBIC  10CC EA  Final   Culture NO GROWTH 3 DAYS  Final   Report Status PENDING  Incomplete  Culture, blood (routine x 2)     Status: None (Preliminary result)   Collection Time: 03/07/16  6:01 PM  Result Value Ref Range Status   Specimen Description BLOOD LEFT ANTECUBITAL  Final   Special Requests BOTTLES DRAWN AEROBIC AND ANAEROBIC  [place] EA  Final   Culture NO GROWTH 3 DAYS  Final   Report Status PENDING  Incomplete    Studies/Results: No results found.    Assessment/Plan:  Principal Problem:   Severe sepsis (HCC) Active Problems:   Spastic 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 ([place])   Community acquired pneumonia   UTI (urinary tract infection)   Abnormal LFTs   Buttock wound   Protein calorie malnutrition (HCC)   Pressure injury of skin    Hypokalemia   Hypernatremia   Sepsis secondary to UTI ([place])   Aspiration pneumonia of left lower lobe due to vomit ([place])   Sacral decubitus ulcer, stage III (HCC)   Multiple sclerosis (HCC)   Protein-calorie malnutrition, severe ([place])   Muscle abscess   Myofascitis   Palliative care by specialist  Deanna Baldwin is a 41 y.o. female with multiple sclerosis complicated by spastic paraplegia on rituximab hospitalized for acute febrile illness found to right gluteal pyomyositis with diffuse pelvic cellulitis and myofasciitis and neutropenia.  Right gluteal pyomyositis with diffuse pelvic cellulitis and myofasciitis: No fevers today so suspect current coverage is adequate. -Continue doxycycline 100 mg twice daily  Neutropenia: ANC 200 today. Drug-related neutropenia may take up to 5-7 days to resolve. Vanc level 4 today is reassuring. -Recommend Hematology consult to confirm suspicion of adverse reaction   LOS: 8 days   Deanna Baldwin 03/10/2016, 5:20 PM

## 2016-03-11 ENCOUNTER — Other Ambulatory Visit (HOSPITAL_COMMUNITY): Payer: Self-pay | Admitting: *Deleted

## 2016-03-11 DIAGNOSIS — D709 Neutropenia, unspecified: Secondary | ICD-10-CM

## 2016-03-11 LAB — CBC WITH DIFFERENTIAL/PLATELET
BASOS ABS: 0 10*3/uL (ref 0.0–0.1)
Basophils Relative: 0 %
EOS PCT: 2 %
Eosinophils Absolute: 0 10*3/uL (ref 0.0–0.7)
HEMATOCRIT: 29.9 % — AB (ref 36.0–46.0)
HEMOGLOBIN: 8.9 g/dL — AB (ref 12.0–15.0)
LYMPHS PCT: 66 %
Lymphs Abs: 1.6 10*3/uL (ref 0.7–4.0)
MCH: 26.7 pg (ref 26.0–34.0)
MCHC: 29.8 g/dL — ABNORMAL LOW (ref 30.0–36.0)
MCV: 89.8 fL (ref 78.0–100.0)
MONOS PCT: 31 %
Monocytes Absolute: 0.7 10*3/uL (ref 0.1–1.0)
NEUTROS PCT: 1 %
Neutro Abs: 0 10*3/uL — ABNORMAL LOW (ref 1.7–7.7)
Platelets: 336 10*3/uL (ref 150–400)
RBC: 3.33 MIL/uL — AB (ref 3.87–5.11)
RDW: 13.4 % (ref 11.5–15.5)
WBC: 2.3 10*3/uL — AB (ref 4.0–10.5)

## 2016-03-11 LAB — MAGNESIUM: Magnesium: 1.6 mg/dL — ABNORMAL LOW (ref 1.7–2.4)

## 2016-03-11 LAB — GLUCOSE, CAPILLARY: Glucose-Capillary: 95 mg/dL (ref 65–99)

## 2016-03-11 LAB — MRSA PCR SCREENING: MRSA by PCR: NEGATIVE

## 2016-03-11 MED ORDER — DOXYCYCLINE HYCLATE 100 MG PO TABS: 100.0000 mg | ORAL_TABLET | Freq: Two times a day (BID) | ORAL | Status: DC

## 2016-03-11 MED ORDER — MAGNESIUM SULFATE 2 GM/50ML IV SOLN
2.0000 g | Freq: Once | INTRAVENOUS | Status: AC
  Administered 2016-03-11: 2 g via INTRAVENOUS
  Filled 2016-03-11: qty 50

## 2016-03-11 MED ORDER — DOXYCYCLINE HYCLATE 100 MG PO TABS
100.0000 mg | ORAL_TABLET | Freq: Two times a day (BID) | ORAL | Status: DC
  Administered 2016-03-11 – 2016-03-15 (×8): 100 mg via ORAL
  Filled 2016-03-11 (×8): qty 1

## 2016-03-11 NOTE — Progress Notes (Signed)
Subjective: This morning, she had no complaints. She had T99F though felt afebrile. I reviewed with her that she had no white count in her blood which put her at risk for future infections and understands she will need a bone marrow biopsy to better assess.   Antibiotics:  Anti-infectives    Start     Dose/Rate Route Frequency Ordered Stop   03/11/16 1045  doxycycline (VIBRA-TABS) tablet 100 mg  Status:  Discontinued     100 mg Oral Every 12 hours 03/11/16 1040 03/11/16 1042   03/11/16 1045  doxycycline (VIBRA-TABS) tablet 100 mg     100 mg Oral 2 times daily 03/11/16 1042     03/09/16 1000  doxycycline (VIBRAMYCIN) 100 mg in dextrose 5 % 250 mL IVPB  Status:  Discontinued     100 mg 125 mL/hr over 120 Minutes Intravenous Every 12 hours 03/09/16 0851 03/11/16 1040   03/09/16 0600  vancomycin (VANCOCIN) IVPB 1000 mg/200 mL premix  Status:  Discontinued     1,000 mg 200 mL/hr over 60 Minutes Intravenous Every 12 hours 03/08/16 1743 03/09/16 0850   03/08/16 1500  vancomycin (VANCOCIN) IVPB 1000 mg/200 mL premix  Status:  Discontinued     1,000 mg 200 mL/hr over 60 Minutes Intravenous Every 12 hours 03/08/16 1425 03/08/16 1644   03/07/16 1530  vancomycin (VANCOCIN) IVPB 1000 mg/200 mL premix  Status:  Discontinued     1,000 mg 200 mL/hr over 60 Minutes Intravenous Every 24 hours 03/07/16 1444 03/08/16 1425   03/07/16 1500  ceFEPIme (MAXIPIME) 1 g in dextrose 5 % 50 mL IVPB  Status:  Discontinued     1 g 100 mL/hr over 30 Minutes Intravenous Every 8 hours 03/07/16 1413 03/08/16 1644   03/04/16 0000  piperacillin-tazobactam (ZOSYN) IVPB 3.375 g  Status:  Discontinued     3.375 g 12.5 mL/hr over 240 Minutes Intravenous Every 8 hours 03/03/16 1634 03/07/16 1412   03/03/16 0800  vancomycin (VANCOCIN) 500 mg in sodium chloride 0.9 % 100 mL IVPB  Status:  Discontinued     500 mg 100 mL/hr over 60 Minutes Intravenous Every 12 hours 03/02/16 2039 03/04/16 1132   03/03/16 0300   piperacillin-tazobactam (ZOSYN) IVPB 3.375 g  Status:  Discontinued     3.375 g 12.5 mL/hr over 240 Minutes Intravenous Every 8 hours 03/02/16 2039 03/03/16 1634   03/02/16 1915  piperacillin-tazobactam (ZOSYN) IVPB 3.375 g     3.375 g 100 mL/hr over 30 Minutes Intravenous  Once 03/02/16 1900 03/02/16 1952   03/02/16 1915  vancomycin (VANCOCIN) IVPB 1000 mg/200 mL premix     1,000 mg 200 mL/hr over 60 Minutes Intravenous  Once 03/02/16 1900 03/02/16 2052      Medications: Scheduled Meds: . doxycycline  100 mg Oral BID  . feeding supplement (ENSURE ENLIVE)  237 mL Oral BID BM   Continuous Infusions: . dextrose 5% lactated ringers 75 mL/hr at 03/11/16 0714   PRN Meds:.acetaminophen **OR** acetaminophen, bisacodyl, HYDROcodone-acetaminophen, ondansetron **OR** ondansetron (ZOFRAN) IV, polyethylene glycol, RESOURCE THICKENUP CLEAR    Objective: Weight change:   Intake/Output Summary (Last 24 hours) at 03/11/16 1810 Last data filed at 03/11/16 1419  Gross per 24 hour  Intake          1778.75 ml  Output             1775 ml  Net             3.75  ml   Blood pressure 113/67, pulse (!) 110, temperature 99.8 F (37.7 C), temperature source Oral, resp. rate 20, height 5' 9" (1.753 m), weight 93 lb (42.2 kg), last menstrual period 01/28/2016, SpO2 99 %. Temp:  [98.6 F (37 C)-99.8 F (37.7 C)] 99.8 F (37.7 C) (02/08 1420) Pulse Rate:  [108-115] 110 (02/08 1420) Resp:  [18-20] 20 (02/08 1420) BP: (96-113)/(62-67) 113/67 (02/08 0653) SpO2:  [96 %-100 %] 99 % (02/08 1420) Weight:  [93 lb (42.2 kg)] 93 lb (42.2 kg) (02/08 0500)  Physical Exam: General: frail, chronically ill-appearing female HEENT: PERRL, EOMI, no scleral icterus, oropharynx clear Cardiac: tachycardic, no rubs, murmurs or gallops Pulm: clear to auscultation bilaterally, no wheezes, rales, or rhonchi Abd: soft, nontender, nondistended, BS present Ext: small area of skin breakdown noted on the medial aspect of her  LLE as seen below   Neuro: responds to questions appropriately; alert and oriented x 3   CBC: _0 (wbc3,Hgb:3,Hct:3,Plt:3,INR:3APTT:3)@   BMET  Recent Labs  03/10/16 0941 03/10/16 1146  NA 141 139  K 3.5 3.3*  CL 102 102  CO2 25 27  GLUCOSE 98 132*  BUN <5* <5*  CREATININE 0.47 0.50  CALCIUM 8.9 8.3*     Liver Panel  No results for input(s): PROT, ALBUMIN, AST, ALT, ALKPHOS, BILITOT, BILIDIR, IBILI in the last 72 hours.     Sedimentation Rate No results for input(s): ESRSEDRATE in the last 72 hours. C-Reactive Protein No results for input(s): CRP in the last 72 hours.  Micro Results: Recent Results (from the past 720 hour(s))  Culture, blood (Routine x 2)     Status: None   Collection Time: 03/02/16  6:32 PM  Result Value Ref Range Status   Specimen Description BLOOD RIGHT FOREARM  Final   Special Requests BOTTLES DRAWN AEROBIC AND ANAEROBIC 5CC  Final   Culture NO GROWTH 5 DAYS  Final   Report Status 03/07/2016 FINAL  Final  Culture, blood (Routine x 2)     Status: None   Collection Time: 03/02/16  6:39 PM  Result Value Ref Range Status   Specimen Description BLOOD RIGHT HAND  Final   Special Requests BOTTLES DRAWN AEROBIC AND ANAEROBIC 5CC  Final   Culture NO GROWTH 5 DAYS  Final   Report Status 03/07/2016 FINAL  Final  Urine culture     Status: Abnormal   Collection Time: 03/02/16  7:29 PM  Result Value Ref Range Status   Specimen Description URINE, CATHETERIZED  Final   Special Requests NONE  Final   Culture >=100,000 COLONIES/mL ESCHERICHIA COLI (A)  Final   Report Status 03/05/2016 FINAL  Final   Organism ID, Bacteria ESCHERICHIA COLI (A)  Final      Susceptibility   Escherichia coli - MIC*    AMPICILLIN <=2 SENSITIVE Sensitive     CEFAZOLIN <=4 SENSITIVE Sensitive     CEFTRIAXONE <=1 SENSITIVE Sensitive     CIPROFLOXACIN <=0.25 SENSITIVE Sensitive     GENTAMICIN <=1 SENSITIVE Sensitive     IMIPENEM <=0.25 SENSITIVE Sensitive      NITROFURANTOIN 32 SENSITIVE Sensitive     TRIMETH/SULFA <=20 SENSITIVE Sensitive     AMPICILLIN/SULBACTAM <=2 SENSITIVE Sensitive     PIP/TAZO <=4 SENSITIVE Sensitive     Extended ESBL NEGATIVE Sensitive     * >=100,000 COLONIES/mL ESCHERICHIA COLI  Respiratory Panel by PCR     Status: None   Collection Time: 03/07/16  5:08 PM  Result Value Ref Range Status   Adenovirus  NOT DETECTED NOT DETECTED Final   Coronavirus 229E NOT DETECTED NOT DETECTED Final   Coronavirus HKU1 NOT DETECTED NOT DETECTED Final   Coronavirus NL63 NOT DETECTED NOT DETECTED Final   Coronavirus OC43 NOT DETECTED NOT DETECTED Final   Metapneumovirus NOT DETECTED NOT DETECTED Final   Rhinovirus / Enterovirus NOT DETECTED NOT DETECTED Final   Influenza A NOT DETECTED NOT DETECTED Final   Influenza B NOT DETECTED NOT DETECTED Final   Parainfluenza Virus 1 NOT DETECTED NOT DETECTED Final   Parainfluenza Virus 2 NOT DETECTED NOT DETECTED Final   Parainfluenza Virus 3 NOT DETECTED NOT DETECTED Final   Parainfluenza Virus 4 NOT DETECTED NOT DETECTED Final   Respiratory Syncytial Virus NOT DETECTED NOT DETECTED Final   Bordetella pertussis NOT DETECTED NOT DETECTED Final   Chlamydophila pneumoniae NOT DETECTED NOT DETECTED Final   Mycoplasma pneumoniae NOT DETECTED NOT DETECTED Final  Urine culture     Status: None   Collection Time: 03/07/16  5:08 PM  Result Value Ref Range Status   Specimen Description URINE, CATHETERIZED  Final   Special Requests NONE  Final   Culture NO GROWTH  Final   Report Status 03/08/2016 FINAL  Final  Culture, blood (routine x 2)     Status: None (Preliminary result)   Collection Time: 03/07/16  5:58 PM  Result Value Ref Range Status   Specimen Description BLOOD RIGHT ANTECUBITAL  Final   Special Requests BOTTLES DRAWN AEROBIC AND ANAEROBIC  10CC EA  Final   Culture NO GROWTH 4 DAYS  Final   Report Status PENDING  Incomplete  Culture, blood (routine x 2)     Status: None (Preliminary  result)   Collection Time: 03/07/16  6:01 PM  Result Value Ref Range Status   Specimen Description BLOOD LEFT ANTECUBITAL  Final   Special Requests BOTTLES DRAWN AEROBIC AND ANAEROBIC  [place] EA  Final   Culture NO GROWTH 4 DAYS  Final   Report Status PENDING  Incomplete  MRSA PCR Screening     Status: None   Collection Time: 03/11/16  8:53 AM  Result Value Ref Range Status   MRSA by PCR NEGATIVE NEGATIVE Final    Comment:        The GeneXpert MRSA Assay (FDA approved for NASAL specimens only), is one component of a comprehensive MRSA colonization surveillance program. It is not intended to diagnose MRSA infection nor to guide or monitor treatment for MRSA infections.     Studies/Results: No results found.    Assessment/Plan:  Principal Problem:   Severe sepsis (HCC) Active Problems:   Spastic 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 ([place])   Community acquired pneumonia   UTI (urinary tract infection)   Abnormal LFTs   Buttock wound   Protein calorie malnutrition (HCC)   Pressure injury of skin   Hypokalemia   Hypernatremia   Sepsis secondary to UTI ([place])   Aspiration pneumonia of left lower lobe due to vomit ([place])   Sacral decubitus ulcer, stage III (HCC)   Multiple sclerosis (HCC)   Protein-calorie malnutrition, severe ([place])   Muscle abscess   Myofascitis   Palliative care by specialist   Other neutropenia ([place])   Neutropenia ([place])  Deanna Baldwin is a 41 y.o. female with multiple sclerosis complicated by spastic paraplegia on rituximab hospitalized for acute febrile illness found to have right gluteal pyomyositis and now severe neutropenia.  Right gluteal pyomyositis: Continue doxycyline 100 mg twice daily [Day 10].   Neutropenia: ANC 0 today.  Hem/Onc consult pending   LOS: 9 days   Deanna Baldwin 03/11/2016, 6:10 PM

## 2016-03-11 NOTE — Progress Notes (Signed)
Speech Language Pathology Treatment: Dysphagia  Patient Details Name: Deanna Baldwin MRN: [id number] DOB: November 23, 1975 Today's Date: 03/11/2016 Time: [id number] SLP Time Calculation (min) (ACUTE ONLY): 14 min  Assessment / Plan / Recommendation Clinical Impression  Deanna Baldwin denied pain and appeared to have increased alertness and decreased confusion. No coughing, throat clearing, or signs of airway compromise were observed with Dys 3 solids and honey thick liquids. SLP reiterated the importance of initiating a purposeful cough/throat clear after every few bites/sips. Informed the pt that a repeat MBS may be completed sometime next week to reassess her swallow functioning and to determine if a diet upgrade is feasible. Recommend continuing with current diet of Dys 3 solids, honey thick liquids, whole meds with puree. Some set up/assist necessary for feeding due to visual disturbances. ST will continue to f/u for treatment.   HPI HPI: Ptis a 41 y.o.femalewith PMH significant for MS with spastic paraplegia, presenting ED for eval of AMS and fever. Pt had upper respiratory illness for last 2-3 weeks, but noted today re-worsening. She was found at home confused and lethargic with temperature reportedly 104 F. Found to be septic and positive for urine infection. CXR from 03/02/2016 showed bilateral patchy alveolar opacities are noted concerning for pneumonia. Silent aspiration noted during thin liquids and nectar thick on MBS from 03/04/2016.       SLP Plan  Continue with current plan of care     Recommendations  Diet recommendations: Dysphagia 3 (mechanical soft);Honey-thick liquid Liquids provided via: Cup Medication Administration: Whole meds with puree Supervision: Patient able to self feed;Full supervision/cueing for compensatory strategies (set up and feeding assist due to visual disturbances) Compensations: Small sips/bites;Slow rate;Minimize environmental distractions Postural Changes  and/or Swallow Maneuvers: Seated upright 90 degrees                Oral Care Recommendations: Oral care BID Follow up Recommendations: Skilled Nursing facility Plan: Continue with current plan of care       Deanna Baldwin , [place] 03/11/2016, 8:31 AM

## 2016-03-11 NOTE — Consult Note (Signed)
Chief Complaint: Patient was seen in consultation today for bone marrow biopsy Chief Complaint  Patient presents with  . Fever  . Altered Mental Status   at the request of Dr Dillard Cannon  Referring Physician(s): Dr Lindi Adie  Supervising Physician: Markus Daft  Patient Status: [facility] - In-pt  History of Present Illness: Deanna Baldwin is a 41 y.o. female   Multiple sclerosis AMS and fever Sepsis; UTI and PNA Back wounds; pyomyositis; pressure ulcers Neutropenia Protein calorie malnutrition  Request now per Hem/Onc for Bone marrow biopsy Will plan for 2/9 in Radiology   Past Medical History:  Diagnosis Date  . Buttock wound 03/03/2016  . MS (multiple sclerosis) ([place])   . Pneumonia 02/2016  . Protein calorie malnutrition ([place])   . Severe sepsis ([place]) 03/03/2016  . UTI (urinary tract infection) 02/2016    Past Surgical History:  Procedure Laterality Date  . NO PAST SURGERIES      Allergies: Patient has no known allergies.  Medications: Prior to Admission medications   Medication Sig Start Date End Date Taking? Authorizing Provider  riTUXimab 1,000 mg in sodium chloride 0.9 % 250 mL Inject 1,000 mg into the vein every 6 (six) months.    Yes Historical Provider, MD     Family History  Problem Relation Age of Onset  . Mental illness Sister     Social History   Social History  . Marital status: Single    Spouse name: N/A  . Number of children: N/A  . Years of education: N/A   Social History Main Topics  . Smoking status: Former Smoker    Packs/day: 1.00    Types: Cigarettes    Quit date: 02/01/2010  . Smokeless tobacco: Never Used  . Alcohol use No  . Drug use: No  . Sexual activity: Not Asked   Other Topics Concern  . None   Social History Narrative  . None    Review of Systems: A 12 point ROS discussed and pertinent positives are indicated in the HPI above.  All other systems are negative.  Review of Systems  Constitutional: Positive for activity  change. Negative for appetite change, fatigue and fever.  Cardiovascular: Negative for chest pain.  Gastrointestinal: Negative for abdominal pain.  Skin: Positive for wound.  Psychiatric/Behavioral: Positive for confusion. Negative for behavioral problems.    Vital Signs: BP 113/67 (BP Location: Left Arm)   Pulse (!) 108   Temp 99.1 F (37.3 C)   Resp 20   Ht 5' 9" (1.753 m)   Wt 93 lb (42.2 kg)   LMP 01/28/2016   SpO2 100%   BMI 13.73 kg/m   Physical Exam  Cardiovascular: Normal rate and regular rhythm.   Pulmonary/Chest: Effort normal and breath sounds normal.  Abdominal: Soft. Bowel sounds are normal.  Musculoskeletal: Normal range of motion.  Neurological: She is alert.  Skin: Skin is warm.  Psychiatric:  Consented with sister via phone Pt able to voice name and dob; not year  Nursing note and vitals reviewed.   Mallampati Score:  MD Evaluation Airway: WNL Heart: WNL Abdomen: WNL Chest/ Lungs: WNL ASA  Classification: 3 Mallampati/Airway Score: One  Imaging: Mr Lumbar Spine W Wo Contrast  Result Date: 03/07/2016 CLINICAL DATA:  Lumbar spine necrotic wound. Multiple sclerosis with spastic paraplegia. EXAM: MRI LUMBAR SPINE WITHOUT AND WITH CONTRAST TECHNIQUE: Multiplanar and multiecho pulse sequences of the lumbar spine were obtained without and with intravenous contrast. CONTRAST:  77m MULTIHANCE GADOBENATE DIMEGLUMINE 529  MG/ML IV SOLN COMPARISON:  Body CT 06/25/2008 FINDINGS: Segmentation: Transitional lumbosacral vertebra with rudimentary S1-2 interspace, based on the lowest visible ribs. Alignment:  Normal Vertebrae:  No fracture, evidence of discitis, or bone lesion. Conus medullaris: Extends to the L2 level and appears normal. Thin fat deposition in the filum terminale, incidental. Paraspinal and other soft tissues: Marked over distention of the urinary bladder with bilateral hydroureteronephrosis. The same appearance was present on 2010 abdominal CT.  Nonspecific subcutaneous fat reticulation throughout the lumbar back. Negative for abscess or muscular edema. Disc levels: No degenerative changes or impingement. IMPRESSION: 1. Negative for lumbar spine infection or impingement. 2. There is non organized edema in the subcutaneous fat. Negative for abscess. 3. Urinary retention causing bilateral hydroureteronephrosis, also seen by CT in 2010. Electronically Signed   By: Monte Fantasia M.D.   On: 03/07/2016 17:10   Mr Pelvis W Wo Contrast  Result Date: 03/07/2016 CLINICAL DATA:  Decubitus ulcers. EXAM: MRI PELVIS WITHOUT AND WITH CONTRAST TECHNIQUE: Multiplanar multisequence MR imaging of the pelvis was performed both before and after administration of intravenous contrast. CONTRAST:  32m MULTIHANCE GADOBENATE DIMEGLUMINE 529 MG/ML IV SOLN COMPARISON:  CT scan 06/25/2008. FINDINGS: Urinary Tract: Diffuse subcutaneous soft tissue swelling/ edema/ fluid involving the buttock areas and sacrum bilaterally, likely cellulitis. Shallow sacral decubitus ulcer is noted but no findings for underlying sacral osteomyelitis. No obvious ulcers over the hip or buttock areas. There is diffuse myositis involving the gluteus maximus muscle on the right and also the adductor muscles bilaterally. Small focal fluid collections in the lateral aspects of the gluteus maximus muscles bilaterally with rim enhancement suspicious for pyomyositis. The largest area is on the right side near the greater trochanter and measures 2 cm. No definite MR findings for septic arthritis or osteomyelitis. There is massive distention of the bladder well up into the abdomen to the level of the umbilicus. No pelvic adenopathy or free pelvic fluid collections. Borderline enlarged inguinal lymph nodes bilaterally. Bilateral lower extremity cellulitis and myositis. IMPRESSION: 1. Cellulitis and diffuse myofasciitis involving the pelvic and hip musculature. There also or rim enhancing fluid collections in the  gluteus maximus muscles bilaterally concerning for pyomyositis. 2. No findings to suggest septic arthritis or osteomyelitis. 3. Shallow sacral decubitus ulcer but no underlying sacral osteomyelitis. 4. Markedly distended bladder. Electronically Signed   By: PMarijo SanesM.D.   On: 03/07/2016 17:15   Dg Chest Port 1 View  Result Date: 03/07/2016 CLINICAL DATA:  Pneumonia. EXAM: PORTABLE CHEST 1 VIEW COMPARISON:  Radiograph of March 02, 2016. FINDINGS: Stable cardiomediastinal silhouette. Right lung is clear. Increased left perihilar and basilar opacity is noted concerning for worsening pneumonia or atelectasis with associated pleural effusion. No pneumothorax is noted. Bony thorax is unremarkable. IMPRESSION: Increase left lung opacity concerning for worsening pneumonia or atelectasis with associated pleural effusion. Electronically Signed   By: JMarijo Conception M.D.   On: 03/07/2016 14:39   Dg Chest Port 1 View  Result Date: 03/02/2016 CLINICAL DATA:  Fever. EXAM: PORTABLE CHEST 1 VIEW COMPARISON:  None. FINDINGS: The heart size and mediastinal contours are within normal limits. No pneumothorax is noted. No significant pleural effusion is noted. Patchy alveolar opacities are noted bilaterally concerning for pneumonia. The visualized skeletal structures are unremarkable. IMPRESSION: Bilateral patchy alveolar opacities are noted concerning for pneumonia. Electronically Signed   By: JMarijo Conception M.D.   On: 03/02/2016 20:05   Dg Swallowing Func-speech Pathology  Result Date: 03/04/2016 Objective Swallowing Evaluation:  Type of Study: MBS-Modified Barium Swallow Study Patient Details Name: Deanna Baldwin MRN: [id number] Date of Birth: 08/04/1975 Today's Date: 03/04/2016 Time: SLP Start Time (ACUTE ONLY): 1335-SLP Stop Time (ACUTE ONLY): 1355 SLP Time Calculation (min) (ACUTE ONLY): 20 min Past Medical History: Past Medical History: Diagnosis Date . Buttock wound 03/03/2016 . MS (multiple sclerosis) ([facility])  .  Pneumonia 02/2016 . Protein calorie malnutrition ([facility])  . Severe sepsis ([facility]) 03/03/2016 . UTI (urinary tract infection) 02/2016 Past Surgical History: Past Surgical History: Procedure Laterality Date . NO PAST SURGERIES   HPI: Ptis a 40 y.o.femalewith PMH significant for MS with spastic paraplegia, presenting ED for eval of AMS and fever. Pt had upper respiratory illness for last 2-3 weeks, but noted today re-worsening. She was found at home confused and lethargic with temperature reportedly 104 F. Found to be septic and positive for urine infection. CXR from 03/02/2016 showed bilateral patchy alveolar opacities are noted concerning for pneumonia. No Data Recorded Assessment / Plan / Recommendation CHL IP CLINICAL IMPRESSIONS 03/04/2016 Therapy Diagnosis Mild oral phase dysphagia;Moderate pharyngeal phase dysphagia;Severe pharyngeal phase dysphagia Clinical Impression Ms. Powley exhibited mild oral dysphagia marked by premature spill to valleculae due to decreased oral cohesion. Mod-severe sensorimotor pharyngeal dysphagia characterized by frank silent aspiration with thin and nectar thick liquids. The SLP attempted various compensatory strategies such as a chin tuck and verbal cues to cough/clear throat, which were not effective and did not prevent pt from penetration or aspiration. No airway compromise with honey thick and solids. Due to the pt's fluctuating cognition/awareness and mulitple sclerosis, recommend diet of dysphagia 3 solids, honey thick liquids (no straw), meds whole in puree, and cough/clear throat after every few bites/sips. ST will continue to f/u for treatment to assess pt's safety/efficiency and swallowing function Impact on safety and function Severe aspiration risk   CHL IP TREATMENT RECOMMENDATION 03/04/2016 Treatment Recommendations Therapy as outlined in treatment plan below   Prognosis 03/04/2016 Prognosis for Safe Diet Advancement Fair Barriers to Reach Goals Cognitive deficits;Severity  of deficits Barriers/Prognosis Comment -- CHL IP DIET RECOMMENDATION 03/04/2016 SLP Diet Recommendations Dysphagia 3 (Mech soft) solids;Honey thick liquids Liquid Administration via No straw;Cup Medication Administration Whole meds with puree Compensations Slow rate;Small sips/bites;Minimize environmental distractions;Clear throat intermittently;Other (Comment) Postural Changes Seated upright at 90 degrees   CHL IP OTHER RECOMMENDATIONS 03/04/2016 Recommended Consults -- Oral Care Recommendations Oral care BID Other Recommendations --   CHL IP FOLLOW UP RECOMMENDATIONS 03/04/2016 Follow up Recommendations Other (comment)   CHL IP FREQUENCY AND DURATION 03/04/2016 Speech Therapy Frequency (ACUTE ONLY) min 2x/week Treatment Duration 2 weeks      CHL IP ORAL PHASE 03/04/2016 Oral Phase Impaired Oral - Pudding Teaspoon -- Oral - Pudding Cup -- Oral - Honey Teaspoon -- Oral - Honey Cup -- Oral - Nectar Teaspoon -- Oral - Nectar Cup Decreased bolus cohesion;Premature spillage Oral - Nectar Straw -- Oral - Thin Teaspoon -- Oral - Thin Cup Premature spillage;Decreased bolus cohesion Oral - Thin Straw -- Oral - Puree -- Oral - Mech Soft -- Oral - Regular -- Oral - Multi-Consistency -- Oral - Pill -- Oral Phase - Comment --  CHL IP PHARYNGEAL PHASE 03/04/2016 Pharyngeal Phase Impaired Pharyngeal- Pudding Teaspoon -- Pharyngeal -- Pharyngeal- Pudding Cup -- Pharyngeal -- Pharyngeal- Honey Teaspoon -- Pharyngeal -- Pharyngeal- Honey Cup WFL Pharyngeal -- Pharyngeal- Nectar Teaspoon -- Pharyngeal -- Pharyngeal- Nectar Cup Penetration/Aspiration before swallow Pharyngeal Material enters airway, passes BELOW cords without attempt by patient to eject out (silent aspiration)   Pharyngeal- Nectar Straw -- Pharyngeal -- Pharyngeal- Thin Teaspoon -- Pharyngeal -- Pharyngeal- Thin Cup Penetration/Aspiration before swallow;Penetration/Aspiration during swallow Pharyngeal Material enters airway, passes BELOW cords without attempt by patient to eject  out (silent aspiration) Pharyngeal- Thin Straw -- Pharyngeal -- Pharyngeal- Puree -- Pharyngeal -- Pharyngeal- Mechanical Soft -- Pharyngeal -- Pharyngeal- Regular WFL Pharyngeal -- Pharyngeal- Multi-consistency -- Pharyngeal -- Pharyngeal- Pill -- Pharyngeal -- Pharyngeal Comment --  CHL IP CERVICAL ESOPHAGEAL PHASE 03/04/2016 Cervical Esophageal Phase Impaired Pudding Teaspoon -- Pudding Cup -- Honey Teaspoon -- Honey Cup -- Nectar Teaspoon -- Nectar Cup -- Nectar Straw -- Thin Teaspoon -- Thin Cup -- Thin Straw -- Puree -- Mechanical Soft -- Regular -- Multi-consistency -- Pill -- Cervical Esophageal Comment -- No flowsheet data found. Houston Siren 03/04/2016, 3:37 PM Orbie Pyo Colvin Caroli.Ed CCC-SLP Pager [phone]               Labs:  CBC:  Recent Labs  03/08/16 0526 03/09/16 0540 03/10/16 1146 03/11/16 0459  WBC 3.5* 1.9* 2.0* 2.3*  HGB 9.1* 8.7* 9.1* 8.9*  HCT 30.8* 29.0* 30.3* 29.9*  PLT 295 267 297 336    COAGS:  Recent Labs  03/02/16 1832 03/02/16 2252  INR 1.28  --   APTT  --  39*    BMP:  Recent Labs  03/08/16 0526 03/09/16 0540 03/10/16 0941 03/10/16 1146  NA 151* 140 141 139  K 4.2 3.0* 3.5 3.3*  CL 112* 99* 102 102  CO2 _0 GLUCOSE 94 93 98 132*  BUN 9 5* <5* <5*  CALCIUM 8.1* 8.2* 8.9 8.3*  CREATININE 0.99 0.67 0.47 0.50  GFRNONAA >60 >60 >60 >60  GFRAA >60 >60 >60 >60    LIVER FUNCTION TESTS:  Recent Labs  03/02/16 1832 03/03/16 0505  BILITOT 1.7* 0.5  AST 113* 84*  ALT 55* 42  ALKPHOS 118 90  PROT 6.2* 5.2*  ALBUMIN 2.2* 1.7*    TUMOR MARKERS: No results for input(s): AFPTM, CEA, CA199, CHROMGRNA in the last 8760 hours.  Assessment and Plan:  Altered mental status MS Sepsis Neutropenia Scheduled for Bone Marrow bx in am Risks and Benefits discussed with the patient's sister including, but not limited to bleeding, infection, damage to adjacent structures or low yield requiring additional tests. All of the patient's  sister questions were answered, she is agreeable to proceed. Consent signed and in chart.   Thank you for this interesting consult.  I greatly enjoyed meeting Deanna Baldwin and look forward to participating in their care.  A copy of this report was sent to the requesting provider on this date.  Electronically Signed: Monia Sabal A 03/11/2016, 10:11 AM   I spent a total of 20 Minutes    in face to face in clinical consultation, greater than 50% of which was counseling/coordinating care for bone marrow bx

## 2016-03-11 NOTE — Progress Notes (Signed)
INFECTIOUS DISEASE ATTENDING ADDENDUM:   Date: 03/11/2016  Patient name: Blackey record number: [id number]  Date of birth: April 14, 1975    This patient has been seen and discussed with the house staff. Please see the resident's note for complete details. I concur with their findings with the following additions/corrections:   Patient still feels well and is afebrile. However she now has no neutrophils on CBC. I discussed the case with Dr. Erlinda Hong and patient is going for bone marrow biopsy tomorrow.   Continue doxycyline for gluteal abscesses    Rhina Brackett Dam 03/11/2016, 5:27 PM

## 2016-03-11 NOTE — Progress Notes (Signed)
PROGRESS NOTE    Deanna Baldwin  UGQ:[id number] DOB: [birth year] DOA: 03/02/2016 PCP: [name], MD    Brief Narrative:  41 yo female, with multiple sclerosis presents with altered mentation and fever. Recent respiratory illness. Found by patient's family lethargic with temperature 104. On the initial evaluation found to be septic. Positive urine infection and bibasilar pulmonary infiltrates. Developed worsening sepsis cw leukopenia and elevated lactic acid, antibiotics have been up-escalated. MRI pelvis with multiple soft tissue collections, with myositis right gluteal region, suspected pyomyositis, largest collection 2 cm. Not candidate for IR drainage, currently on conservative therapy.   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)   Muscle abscess   Myofascitis   Palliative care by specialist   Other neutropenia ([place])    Sepsis due to urine infection, present on admission.  Urine culture positive for e coli pan sensitive. Patient had 6 days of therapy.   Pneumonia bilateral, possible aspiration, present on admission. Aspiration precautions, continue dysphagia 3, mechanical soft with honey thick liquids. Oxymetry monitoring and supplemental 02 per  to target 02 sat above 92%.  Right gluteal pyomyositis; on doxycycline per ID recommendation  Pressure ulcers, sacral, present on admission, stage 2 to 3, multiple. Will continue conservative therapy with IV antibiotics for pelvic myofascitis and  gluteal pyomyositis. WBC ar 1.9. Continue vancomycin and doxycylcine.    Multiple sclerosis. Patient has loss follow up for MS at tertiary care center. Family requesting re-evaluation for neurology on this admission.  She has been getting ritaximab , last dose in 01/2016  Neutropenia/agranulocytosis, from ritaximab ? Hematology/oncology Dr Lindi Adie consulted, recommend bone marrow biopsy  Protein calorie malnutrition. BMI 16. Very deconditioned, palliative care team consulted. Per nutrition ensure bid and magic cup tid.   Hypernatremia. Continue hydration with d5LR  at 75 cc per hour, for maintenance fluids. Follow renal panel in am, avoid hypotension or nephrotoxic medications.   Hypokalemia/hypomagnesemia: replace k/mag.   DVT prophylaxis:enoxaparin Code Status:full  Family Communication:No family at the bedside  Disposition Plan:SNF  Consultants:  Wound care  Infectious disease  Palliative care  Hematology/oncology  Procedures: hydrotherapy   Antimicrobials:   Vancomycin then doxycyclin  Subjective: [name], nad, denies pain currently, no fever  Objective: Vitals:   03/10/16 1412 03/10/16 2140 03/11/16 0500 03/11/16 0653  BP: 102/67 96/62  113/67  Pulse: (!) 117 (!) 115  (!) 108  Resp: _0 Temp: 98.1 F (36.7 C) 98.6 F (37 C)  99.1 F (37.3 C)  TempSrc: Oral Oral    SpO2: 99% 96%  100%  Weight:   42.2 kg (93 lb)   Height:        Intake/Output Summary (Last 24 hours) at 03/11/16 0812 Last data filed at 03/11/16 0657  Gross per 24 hour  Intake             1193 ml  Output             1350 ml  Net             -  157 ml   Filed Weights   03/07/16 0500 03/08/16 0500 03/11/16 0500  Weight: 44.5 kg (98 lb) 43.1 kg (95 lb) 42.2 kg (93 lb)    Examination:  General exam: deconditioned, very frail, thin, malnourished  E ENT; no pallor or icterus, oral mucosa dry.  Respiratory system: Clear to auscultation. Respiratory effort normal.  Decreased breath sound at bases.  Cardiovascular system: S1 & S2 heard, RRR. No JVD, murmurs, rubs, gallops or clicks. No pedal edema. Gastrointestinal system: Abdomen is nondistended, soft and nontender. No organomegaly or masses felt. Normal bowel sounds heard. Central nervous system: Alert and oriented. No focal neurological deficits. Extremities: Symmetric 5 x 5 power. Skin: multiple stage 2 and 3 pressure ulcers at the pelvic region.   Data Reviewed: I have personally reviewed following labs and imaging studies  CBC:  Recent Labs Lab 03/07/16 1210 03/08/16 0526 03/09/16 0540 03/10/16 1146 03/11/16 0459  WBC 1.8* 3.5* 1.9* 2.0* 2.3*  NEUTROABS 1.0* 2.7 0.7* 0.2* 0.0*  HGB 9.6* 9.1* 8.7* 9.1* 8.9*  HCT 32.8* 30.8* 29.0* 30.3* 29.9*  MCV 92.7 92.5 90.9 89.6 89.8  PLT 322 295 267 297 076   Basic Metabolic Panel:  Recent Labs Lab 03/07/16 0840 03/08/16 0526 03/09/16 0540 03/10/16 0941 03/10/16 1146 03/11/16 0459  NA 143 151* 140 141 139  --   K 3.2* 4.2 3.0* 3.5 3.3*  --   CL 103 112* 99* 102 102  --   CO2 _0 --   GLUCOSE 112* 94 93 98 132*  --   BUN 9 9 5* <5* <5*  --   CREATININE 0.99 0.99 0.67 0.47 0.50  --   CALCIUM 7.4* 8.1* 8.2* 8.9 8.3*  --   MG 2.0 1.8 1.5* 1.5* 1.4* 1.6*   GFR: Estimated Creatinine Clearance: 62.9 mL/min (by C-G formula based on SCr of 0.5 mg/dL). Liver Function Tests: No results for input(s): AST, ALT, ALKPHOS, BILITOT, PROT, ALBUMIN in the last 168 hours. No results for input(s): LIPASE, AMYLASE in the last 168 hours. No results for input(s): AMMONIA in the last 168 hours. Coagulation Profile: No results for input(s): INR, PROTIME in the last 168 hours. Cardiac Enzymes: No results for input(s): CKTOTAL, CKMB, CKMBINDEX, TROPONINI in the last 168 hours. BNP (last 3 results) No results for input(s): PROBNP in the last 8760 hours. HbA1C: No results for input(s): HGBA1C in the last 72 hours. CBG:  Recent Labs Lab  03/05/16 0813 03/06/16 0806 03/07/16 0802 03/08/16 0800 03/11/16 0746  GLUCAP 102* 98 115* 115* 95   Lipid Profile: No results for input(s): CHOL, HDL, LDLCALC, TRIG, CHOLHDL, LDLDIRECT in the last 72 hours. Thyroid Function Tests: No results for input(s): TSH, T4TOTAL, FREET4, T3FREE, THYROIDAB in the last 72 hours. Anemia Panel: No results for input(s): VITAMINB12, FOLATE, FERRITIN, TIBC, IRON, RETICCTPCT in the last 72 hours. Sepsis Labs:  Recent Labs Lab 03/07/16 1210 03/08/16 1541  LATICACIDVEN 2.5* 2.7*    Recent Results (from the past 240 hour(s))  Culture, blood (Routine x 2)     Status: None   Collection Time: 03/02/16  6:32 PM  Result Value Ref Range Status   Specimen Description BLOOD RIGHT FOREARM  Final   Special Requests BOTTLES DRAWN AEROBIC AND ANAEROBIC 5CC  Final   Culture NO GROWTH 5 DAYS  Final   Report Status 03/07/2016 FINAL  Final  Culture, blood (Routine x 2)     Status: None   Collection Time: 03/02/16  6:39  PM  Result Value Ref Range Status   Specimen Description BLOOD RIGHT HAND  Final   Special Requests BOTTLES DRAWN AEROBIC AND ANAEROBIC 5CC  Final   Culture NO GROWTH 5 DAYS  Final   Report Status 03/07/2016 FINAL  Final  Urine culture     Status: Abnormal   Collection Time: 03/02/16  7:29 PM  Result Value Ref Range Status   Specimen Description URINE, CATHETERIZED  Final   Special Requests NONE  Final   Culture >=100,000 COLONIES/mL ESCHERICHIA COLI (A)  Final   Report Status 03/05/2016 FINAL  Final   Organism ID, Bacteria ESCHERICHIA COLI (A)  Final      Susceptibility   Escherichia coli - MIC*    AMPICILLIN <=2 SENSITIVE Sensitive     CEFAZOLIN <=4 SENSITIVE Sensitive     CEFTRIAXONE <=1 SENSITIVE Sensitive     CIPROFLOXACIN <=0.25 SENSITIVE Sensitive     GENTAMICIN <=1 SENSITIVE Sensitive     IMIPENEM <=0.25 SENSITIVE Sensitive     NITROFURANTOIN 32 SENSITIVE Sensitive     TRIMETH/SULFA <=20 SENSITIVE Sensitive      AMPICILLIN/SULBACTAM <=2 SENSITIVE Sensitive     PIP/TAZO <=4 SENSITIVE Sensitive     Extended ESBL NEGATIVE Sensitive     * >=100,000 COLONIES/mL ESCHERICHIA COLI  Respiratory Panel by PCR     Status: None   Collection Time: 03/07/16  5:08 PM  Result Value Ref Range Status   Adenovirus NOT DETECTED NOT DETECTED Final   Coronavirus 229E NOT DETECTED NOT DETECTED Final   Coronavirus HKU1 NOT DETECTED NOT DETECTED Final   Coronavirus NL63 NOT DETECTED NOT DETECTED Final   Coronavirus OC43 NOT DETECTED NOT DETECTED Final   Metapneumovirus NOT DETECTED NOT DETECTED Final   Rhinovirus / Enterovirus NOT DETECTED NOT DETECTED Final   Influenza A NOT DETECTED NOT DETECTED Final   Influenza B NOT DETECTED NOT DETECTED Final   Parainfluenza Virus 1 NOT DETECTED NOT DETECTED Final   Parainfluenza Virus 2 NOT DETECTED NOT DETECTED Final   Parainfluenza Virus 3 NOT DETECTED NOT DETECTED Final   Parainfluenza Virus 4 NOT DETECTED NOT DETECTED Final   Respiratory Syncytial Virus NOT DETECTED NOT DETECTED Final   Bordetella pertussis NOT DETECTED NOT DETECTED Final   Chlamydophila pneumoniae NOT DETECTED NOT DETECTED Final   Mycoplasma pneumoniae NOT DETECTED NOT DETECTED Final  Urine culture     Status: None   Collection Time: 03/07/16  5:08 PM  Result Value Ref Range Status   Specimen Description URINE, CATHETERIZED  Final   Special Requests NONE  Final   Culture NO GROWTH  Final   Report Status 03/08/2016 FINAL  Final  Culture, blood (routine x 2)     Status: None (Preliminary result)   Collection Time: 03/07/16  5:58 PM  Result Value Ref Range Status   Specimen Description BLOOD RIGHT ANTECUBITAL  Final   Special Requests BOTTLES DRAWN AEROBIC AND ANAEROBIC  10CC EA  Final   Culture NO GROWTH 3 DAYS  Final   Report Status PENDING  Incomplete  Culture, blood (routine x 2)     Status: None (Preliminary result)   Collection Time: 03/07/16  6:01 PM  Result Value Ref Range Status   Specimen  Description BLOOD LEFT ANTECUBITAL  Final   Special Requests BOTTLES DRAWN AEROBIC AND ANAEROBIC  [place] EA  Final   Culture NO GROWTH 3 DAYS  Final   Report Status PENDING  Incomplete         Radiology Studies:  No results found.      Scheduled Meds: . doxycycline (VIBRAMYCIN) IV  100 mg Intravenous Q12H  . feeding supplement (ENSURE ENLIVE)  237 mL Oral BID BM   Continuous Infusions: . dextrose 5% lactated ringers 75 mL/hr at 03/11/16 0714     LOS: 9 days      Judine Arciniega, MD PhD [facility] Pager [phone]  If 7PM-7AM, please contact night-coverage [email or web address] Password [email or web address] 03/11/2016, 8:12 AM

## 2016-03-12 ENCOUNTER — Inpatient Hospital Stay (HOSPITAL_COMMUNITY)
Admit: 2016-03-12 | Discharge: 2016-03-12 | Disposition: A | Discharge status: Home / Self Care | Payer: Medicare Other | Attending: Internal Medicine | Admitting: Internal Medicine

## 2016-03-12 DIAGNOSIS — D709 Neutropenia, unspecified: Secondary | ICD-10-CM

## 2016-03-12 DIAGNOSIS — G35 Multiple sclerosis: Secondary | ICD-10-CM

## 2016-03-12 DIAGNOSIS — R531 Weakness: Secondary | ICD-10-CM

## 2016-03-12 LAB — CULTURE, BLOOD (ROUTINE X 2)
CULTURE: NO GROWTH
Culture: NO GROWTH

## 2016-03-12 LAB — CBC WITH DIFFERENTIAL/PLATELET
Basophils Absolute: 0 10*3/uL (ref 0.0–0.1)
Basophils Relative: 0 %
EOS PCT: 2 %
Eosinophils Absolute: 0.1 10*3/uL (ref 0.0–0.7)
HEMATOCRIT: 29 % — AB (ref 36.0–46.0)
Hemoglobin: 8.7 g/dL — ABNORMAL LOW (ref 12.0–15.0)
LYMPHS ABS: 1.4 10*3/uL (ref 0.7–4.0)
Lymphocytes Relative: 57 %
MCH: 26.9 pg (ref 26.0–34.0)
MCHC: 30 g/dL (ref 30.0–36.0)
MCV: 89.8 fL (ref 78.0–100.0)
MONO ABS: 0.9 10*3/uL (ref 0.1–1.0)
MONOS PCT: 34 %
NEUTROS ABS: 0.2 10*3/uL — AB (ref 1.7–7.7)
Neutrophils Relative %: 7 %
PLATELETS: 392 10*3/uL (ref 150–400)
RBC: 3.23 MIL/uL — ABNORMAL LOW (ref 3.87–5.11)
RDW: 13.6 % (ref 11.5–15.5)
WBC: 2.6 10*3/uL — ABNORMAL LOW (ref 4.0–10.5)

## 2016-03-12 LAB — BASIC METABOLIC PANEL
Anion gap: 12 (ref 5–15)
CALCIUM: 8.5 mg/dL — AB (ref 8.9–10.3)
CO2: 25 mmol/L (ref 22–32)
CREATININE: 0.47 mg/dL (ref 0.44–1.00)
Chloride: 104 mmol/L (ref 101–111)
GFR calc Af Amer: >60 mL/min (ref >60)
GFR calc non Af Amer: >60 mL/min (ref >60)
Glucose, Bld: 104 mg/dL — ABNORMAL HIGH (ref 65–99)
Potassium: 3.6 mmol/L (ref 3.5–5.1)
Sodium: 141 mmol/L (ref 135–145)

## 2016-03-12 LAB — GLUCOSE, CAPILLARY: Glucose-Capillary: 121 mg/dL — ABNORMAL HIGH (ref 65–99)

## 2016-03-12 LAB — MAGNESIUM: Magnesium: 1.7 mg/dL (ref 1.7–2.4)

## 2016-03-12 MED ORDER — FENTANYL CITRATE (PF) 100 MCG/2ML IJ SOLN
INTRAMUSCULAR | Status: AC | PRN
  Administered 2016-03-12 (×2): 25 ug via INTRAVENOUS

## 2016-03-12 MED ORDER — LIDOCAINE HCL 1 % IJ SOLN
INTRAMUSCULAR | Status: AC
  Filled 2016-03-12: qty 20

## 2016-03-12 MED ORDER — MIDAZOLAM HCL 2 MG/2ML IJ SOLN
INTRAMUSCULAR | Status: AC | PRN
  Administered 2016-03-12 (×2): 0.5 mg via INTRAVENOUS

## 2016-03-12 MED ORDER — TBO-FILGRASTIM 480 MCG/0.8ML ~~LOC~~ SOSY
480.0000 ug | PREFILLED_SYRINGE | Freq: Every day | SUBCUTANEOUS | Status: DC
  Filled 2016-03-12: qty 0.8

## 2016-03-12 MED ORDER — FENTANYL CITRATE (PF) 100 MCG/2ML IJ SOLN
INTRAMUSCULAR | Status: AC
  Filled 2016-03-12: qty 4

## 2016-03-12 MED ORDER — MIDAZOLAM HCL 2 MG/2ML IJ SOLN
INTRAMUSCULAR | Status: AC
  Filled 2016-03-12: qty 4

## 2016-03-12 MED ORDER — ENOXAPARIN SODIUM 30 MG/0.3ML ~~LOC~~ SOLN
30.0000 mg | SUBCUTANEOUS | Status: DC
  Administered 2016-03-13 – 2016-03-15 (×3): 30 mg via SUBCUTANEOUS
  Filled 2016-03-12 (×3): qty 0.3

## 2016-03-12 MED ORDER — TBO-FILGRASTIM 300 MCG/0.5ML ~~LOC~~ SOSY
300.0000 ug | PREFILLED_SYRINGE | Freq: Every day | SUBCUTANEOUS | Status: DC
  Administered 2016-03-12 – 2016-03-13 (×2): 300 ug via SUBCUTANEOUS
  Filled 2016-03-12 (×2): qty 0.5

## 2016-03-12 NOTE — Progress Notes (Signed)
   03/12/16 1000  Clinical Encounter Type  Visited With Patient  Visit Type Follow-up  Spiritual Encounters  Spiritual Needs Emotional  Stress Factors  Patient Stress Factors None identified  Introduction to Pt. Pt not ready to complete AD documentation at this time. Follow up as needed.

## 2016-03-12 NOTE — Consult Note (Signed)
[facility] CONSULT NOTE  Patient Care Team: [name], MD as PCP - General (Neurology)  CHIEF COMPLAINTS/PURPOSE OF CONSULTATION:  Severe neutropenia  HISTORY OF PRESENTING ILLNESS:  Deanna Baldwin 41 y.o. female is admitted to the hospital with signs and symptoms of sepsis with fevers and altered mental function. She was apparently found lethargic with a temperature of 104. She has been treated for infection with different antibiotics including Zosyn and vancomycin and currently on doxycycline. She has multiple sclerosis with lower extremity paralysis. At the time of hospitalization, on 03/03/2015, patient had a neutrophil count of 11,800. This has slowly declined during hospitalization. On 03/07/2016, [place] was 1000 and today 29 2018 the [place] is 200. Patient had previously received rituximab treatment for multiple sclerosis. She is currently on doxycycline antibiotic. Very difficult to get history from the patient. She tells me that she had lost weight significantly. This is a result of multiple sclerosis and had multiple infections. She had a bone marrow biopsy done earlier today. She tells me that she is not in any significant pain.  I reviewed her records extensively and collaborated the history with the patient.  MEDICAL HISTORY:  Past Medical History:  Diagnosis Date  . Buttock wound 03/03/2016  . MS (multiple sclerosis) ([place])   . Pneumonia 02/2016  . Protein calorie malnutrition ([place])   . Severe sepsis ([place]) 03/03/2016  . UTI (urinary tract infection) 02/2016    SURGICAL HISTORY: Past Surgical History:  Procedure Laterality Date  . NO PAST SURGERIES      SOCIAL HISTORY: Social History   Social History  . Marital status: Single    Spouse name: N/A  . Number of children: N/A  . Years of education: N/A   Occupational History  . Not on file.   Social History Main Topics  . Smoking status: Former Smoker    Packs/day: 1.00    Types: Cigarettes    Quit  date: 02/01/2010  . Smokeless tobacco: Never Used  . Alcohol use No  . Drug use: No  . Sexual activity: Not on file   Other Topics Concern  . Not on file   Social History Narrative  . No narrative on file    FAMILY HISTORY: Family History  Problem Relation Age of Onset  . Mental illness Sister     ALLERGIES:  has No Known Allergies.  MEDICATIONS:  Current Facility-Administered Medications  Medication Dose Route Frequency Provider Last Rate Last Dose  . acetaminophen (TYLENOL) tablet 650 mg  650 mg Oral Q6H PRN Vianne Bulls, MD   650 mg at 03/08/16 1420   Or  . acetaminophen (TYLENOL) suppository 650 mg  650 mg Rectal Q6H PRN Vianne Bulls, MD   650 mg at 03/07/16 1401  . bisacodyl (DULCOLAX) EC tablet 5 mg  5 mg Oral Daily PRN Ilene Qua Opyd, MD      . dextrose 5 % in lactated ringers infusion   Intravenous Continuous Mauricio Gerome Apley, MD 75 mL/hr at 03/12/16 1150    . doxycycline (VIBRA-TABS) tablet 100 mg  100 mg Oral BID Florencia Reasons, MD   100 mg at 03/12/16 1154  . [START ON 03/13/2016] enoxaparin (LOVENOX) injection 30 mg  30 mg Subcutaneous Q24H Rebecka Apley, [facility]      . feeding supplement (ENSURE ENLIVE) (ENSURE ENLIVE) liquid 237 mL  237 mL Oral BID BM Tawni Millers, MD   237 mL at 03/12/16 1000  . HYDROcodone-acetaminophen (NORCO/VICODIN) 5-325 MG per  tablet 1-2 tablet  1-2 tablet Oral Q4H PRN Vianne Bulls, MD   2 tablet at 03/07/16 1526  . ondansetron (ZOFRAN) tablet 4 mg  4 mg Oral Q6H PRN Vianne Bulls, MD       Or  . ondansetron (ZOFRAN) injection 4 mg  4 mg Intravenous Q6H PRN Ilene Qua Opyd, MD      . polyethylene glycol (MIRALAX / GLYCOLAX) packet 17 g  17 g Oral Daily PRN Vianne Bulls, MD      . Felipe Drone THICKENUP CLEAR   Oral PRN Domenic Polite, MD      . Tbo-Filgrastim [facility]) injection 480 mcg  480 mcg Subcutaneous q1800 Florencia Reasons, MD        REVIEW OF SYSTEMS:   Constitutional: Denies fevers, chills or abnormal night sweats Eyes: Denies  blurriness of vision, double vision or watery eyes Ears, nose, mouth, throat, and face: Denies mucositis or sore throat Respiratory: Denies cough, dyspnea or wheezes Cardiovascular: Denies palpitation, chest discomfort or lower extremity swelling Gastrointestinal:  Denies nausea, heartburn or change in bowel habits Skin: Denies abnormal skin rashes Lymphatics: Denies new lymphadenopathy or easy bruising Neurological:Generally maceration and lower extremity paralysis All other systems were reviewed with the patient and are negative.  PHYSICAL EXAMINATION: ECOG PERFORMANCE STATUS: 4 - Bedbound  Vitals:   03/11/16 2348 03/12/16 0647  BP: 118/66 (!) 104/58  Pulse: (!) 118 (!) 120  Resp: 17 18  Temp: 98.9 F (37.2 C) 98.3 F (36.8 C)   Filed Weights   03/07/16 0500 03/08/16 0500 03/11/16 0500  Weight: 98 lb (44.5 kg) 95 lb (43.1 kg) 93 lb (42.2 kg)    GENERAL:alert, no distress and comfortable SKIN: skin color, texture, turgor are normal, no rashes or significant lesions EYES: normal, conjunctiva are pink and non-injected, sclera clear OROPHARYNX:no exudate, no erythema and lips, buccal mucosa, and tongue normal  NECK: supple, thyroid normal size, non-tender, without nodularity LUNGS: clear to auscultation and percussion with normal breathing effort HEART: regular rate & rhythm and no murmurs and no lower extremity edema ABDOMEN:abdomen soft, non-tender and normal bowel sounds, No hepatosplenomegaly Musculoskeletal:Diffuse atrophy of extremities  PSYCH: Difficult to understand her speech but answers questions appropriately NEURO: Multiple sclerosis with paralysis  LABORATORY DATA:  I have reviewed the data as listed Lab Results  Component Value Date   WBC 2.6 (L) 03/12/2016   HGB 8.7 (L) 03/12/2016   HCT 29.0 (L) 03/12/2016   MCV 89.8 03/12/2016   PLT 392 03/12/2016   Lab Results  Component Value Date   NA 141 03/12/2016   K 3.6 03/12/2016   CL 104 03/12/2016   CO2 25  03/12/2016    RADIOGRAPHIC STUDIES: I have personally reviewed the radiological reports and agreed with the findings in the report.  ASSESSMENT AND PLAN:  1. Severe neutropenia ANC 200: Differential diagnosis is between infection versus medications versus primary bone marrow pathology. Bone marrow biopsy was performed today. We would not have the results for at least 4-5 days. I suspect that this is primarily infection versus medications. I recommend starting the patient on growth factor injections. Based on her weight she would only need 300 g Daily. If ANC crosses 1000 we can discontinue Clarinex.  2. multiple sclerosis with severe sepsis 3. Profound generalized weakness Monitor her CBC with differential every day. Even if it is medication related, after the medications being discontinued, it may take several days to weeks for the blood counts to improve. Please call the  on-call physician over the weekend if necessary. I will follow the patient on Monday.  All questions were answered. The patient knows to call the clinic with any problems, questions or concerns.    Rulon Eisenmenger, MD '@T' @

## 2016-03-12 NOTE — Progress Notes (Signed)
      INFECTIOUS DISEASE ATTENDING ADDENDUM:   Date: 03/12/2016  Patient name: [place] record number: [id number]  Date of birth: [birth year]    This patient has been seen and discussed with the house staff. Please see the resident's note for complete details. I concur with their findings with the following additions/corrections:  Patient continues to look well and is afebrile  She underwent bone marrow biopsy this morning and is starting on Granix  I would plan on giving her 2 weeks of doxycycline 100 TWICE daily total to treat the gluteal abscesses  I doubt the doxycycline caused her neutropenia since it was RE going on prior to the start of doxycycline and the reason we changed to doxycycline.  Greatly appreciate hematology oncology's help  I will otherwise sign off for now.  Alcide Evener 03/12/2016, 12:23 PM

## 2016-03-12 NOTE — Care Management Important Message (Signed)
Important Message  Patient Details  Name: Deanna Baldwin MRN: [id number] Date of Birth: [birth year]   Medicare Important Message Given:  Yes    Khrystyne Arpin Abena 03/12/2016, 2:07 PM

## 2016-03-12 NOTE — Progress Notes (Addendum)
Subjective: She was not in the room this morning for assessment.   Antibiotics:  Anti-infectives    Start     Dose/Rate Route Frequency Ordered Stop   03/11/16 1045  doxycycline (VIBRA-TABS) tablet 100 mg  Status:  Discontinued     100 mg Oral Every 12 hours 03/11/16 1040 03/11/16 1042   03/11/16 1045  doxycycline (VIBRA-TABS) tablet 100 mg     100 mg Oral 2 times daily 03/11/16 1042     03/09/16 1000  doxycycline (VIBRAMYCIN) 100 mg in dextrose 5 % 250 mL IVPB  Status:  Discontinued     100 mg 125 mL/hr over 120 Minutes Intravenous Every 12 hours 03/09/16 0851 03/11/16 1040   03/09/16 0600  vancomycin (VANCOCIN) IVPB 1000 mg/200 mL premix  Status:  Discontinued     1,000 mg 200 mL/hr over 60 Minutes Intravenous Every 12 hours 03/08/16 1743 03/09/16 0850   03/08/16 1500  vancomycin (VANCOCIN) IVPB 1000 mg/200 mL premix  Status:  Discontinued     1,000 mg 200 mL/hr over 60 Minutes Intravenous Every 12 hours 03/08/16 1425 03/08/16 1644   03/07/16 1530  vancomycin (VANCOCIN) IVPB 1000 mg/200 mL premix  Status:  Discontinued     1,000 mg 200 mL/hr over 60 Minutes Intravenous Every 24 hours 03/07/16 1444 03/08/16 1425   03/07/16 1500  ceFEPIme (MAXIPIME) 1 g in dextrose 5 % 50 mL IVPB  Status:  Discontinued     1 g 100 mL/hr over 30 Minutes Intravenous Every 8 hours 03/07/16 1413 03/08/16 1644   03/04/16 0000  piperacillin-tazobactam (ZOSYN) IVPB 3.375 g  Status:  Discontinued     3.375 g 12.5 mL/hr over 240 Minutes Intravenous Every 8 hours 03/03/16 1634 03/07/16 1412   03/03/16 0800  vancomycin (VANCOCIN) 500 mg in sodium chloride 0.9 % 100 mL IVPB  Status:  Discontinued     500 mg 100 mL/hr over 60 Minutes Intravenous Every 12 hours 03/02/16 2039 03/04/16 1132   03/03/16 0300  piperacillin-tazobactam (ZOSYN) IVPB 3.375 g  Status:  Discontinued     3.375 g 12.5 mL/hr over 240 Minutes Intravenous Every 8 hours 03/02/16 2039 03/03/16 1634   03/02/16 1915   piperacillin-tazobactam (ZOSYN) IVPB 3.375 g     3.375 g 100 mL/hr over 30 Minutes Intravenous  Once 03/02/16 1900 03/02/16 1952   03/02/16 1915  vancomycin (VANCOCIN) IVPB 1000 mg/200 mL premix     1,000 mg 200 mL/hr over 60 Minutes Intravenous  Once 03/02/16 1900 03/02/16 2052      Medications: Scheduled Meds: . doxycycline  100 mg Oral BID  . [START ON 03/13/2016] enoxaparin (LOVENOX) injection  30 mg Subcutaneous Q24H  . feeding supplement (ENSURE ENLIVE)  237 mL Oral BID BM  . Tbo-filgastrim (GRANIX) SQ  480 mcg Subcutaneous q1800   Continuous Infusions: . dextrose 5% lactated ringers Stopped (03/12/16 0805)   PRN Meds:.acetaminophen **OR** acetaminophen, bisacodyl, HYDROcodone-acetaminophen, ondansetron **OR** ondansetron (ZOFRAN) IV, polyethylene glycol, RESOURCE THICKENUP CLEAR    Objective: Weight change:   Intake/Output Summary (Last 24 hours) at 03/12/16 1237 Last data filed at 03/12/16 0650  Gross per 24 hour  Intake           2897.5 ml  Output             1850 ml  Net           1047.5 ml   Blood pressure (!) 104/58, pulse (!) 120, temperature 98.3 F (36.8 C),  temperature source Oral, resp. rate 18, height _0  (1.753 m), weight 93 lb (42.2 kg), last menstrual period 01/28/2016, SpO2 94 %. Temp:  [98.3 F (36.8 C)-99.8 F (37.7 C)] 98.3 F (36.8 C) (02/09 0647) Pulse Rate:  [110-120] 111 (02/09 0915) Resp:  [14-20] 15 (02/09 0915) BP: (85-118)/(57-66) 104/62 (02/09 0924) SpO2:  [94 %-100 %] 98 % (02/09 0915)  Physical Exam: Unable to examine today  BMET  Recent Labs  03/10/16 1146 03/12/16 0528  NA 139 141  K 3.3* 3.6  CL 102 104  CO2 27 25  GLUCOSE 132* 104*  BUN <5* <5*  CREATININE 0.50 0.47  CALCIUM 8.3* 8.5*     Liver Panel  No results for input(s): PROT, ALBUMIN, AST, ALT, ALKPHOS, BILITOT, BILIDIR, IBILI in the last 72 hours.     Sedimentation Rate No results for input(s): ESRSEDRATE in the last 72 hours. C-Reactive  Protein No results for input(s): CRP in the last 72 hours.  Micro Results: Recent Results (from the past 720 hour(s))  Culture, blood (Routine x 2)     Status: None   Collection Time: 03/02/16  6:32 PM  Result Value Ref Range Status   Specimen Description BLOOD RIGHT FOREARM  Final   Special Requests BOTTLES DRAWN AEROBIC AND ANAEROBIC 5CC  Final   Culture NO GROWTH 5 DAYS  Final   Report Status 03/07/2016 FINAL  Final  Culture, blood (Routine x 2)     Status: None   Collection Time: 03/02/16  6:39 PM  Result Value Ref Range Status   Specimen Description BLOOD RIGHT HAND  Final   Special Requests BOTTLES DRAWN AEROBIC AND ANAEROBIC 5CC  Final   Culture NO GROWTH 5 DAYS  Final   Report Status 03/07/2016 FINAL  Final  Urine culture     Status: Abnormal   Collection Time: 03/02/16  7:29 PM  Result Value Ref Range Status   Specimen Description URINE, CATHETERIZED  Final   Special Requests NONE  Final   Culture >=100,000 COLONIES/mL ESCHERICHIA COLI (A)  Final   Report Status 03/05/2016 FINAL  Final   Organism ID, Bacteria ESCHERICHIA COLI (A)  Final      Susceptibility   Escherichia coli - MIC*    AMPICILLIN <=2 SENSITIVE Sensitive     CEFAZOLIN <=4 SENSITIVE Sensitive     CEFTRIAXONE <=1 SENSITIVE Sensitive     CIPROFLOXACIN <=0.25 SENSITIVE Sensitive     GENTAMICIN <=1 SENSITIVE Sensitive     IMIPENEM <=0.25 SENSITIVE Sensitive     NITROFURANTOIN 32 SENSITIVE Sensitive     TRIMETH/SULFA <=20 SENSITIVE Sensitive     AMPICILLIN/SULBACTAM <=2 SENSITIVE Sensitive     PIP/TAZO <=4 SENSITIVE Sensitive     Extended ESBL NEGATIVE Sensitive     * >=100,000 COLONIES/mL ESCHERICHIA COLI  Respiratory Panel by PCR     Status: None   Collection Time: 03/07/16  5:08 PM  Result Value Ref Range Status   Adenovirus NOT DETECTED NOT DETECTED Final   Coronavirus 229E NOT DETECTED NOT DETECTED Final   Coronavirus HKU1 NOT DETECTED NOT DETECTED Final   Coronavirus NL63 NOT DETECTED NOT  DETECTED Final   Coronavirus OC43 NOT DETECTED NOT DETECTED Final   Metapneumovirus NOT DETECTED NOT DETECTED Final   Rhinovirus / Enterovirus NOT DETECTED NOT DETECTED Final   Influenza A NOT DETECTED NOT DETECTED Final   Influenza B NOT DETECTED NOT DETECTED Final   Parainfluenza Virus 1 NOT DETECTED NOT DETECTED Final   Parainfluenza Virus 2  NOT DETECTED NOT DETECTED Final   Parainfluenza Virus 3 NOT DETECTED NOT DETECTED Final   Parainfluenza Virus 4 NOT DETECTED NOT DETECTED Final   Respiratory Syncytial Virus NOT DETECTED NOT DETECTED Final   Bordetella pertussis NOT DETECTED NOT DETECTED Final   Chlamydophila pneumoniae NOT DETECTED NOT DETECTED Final   Mycoplasma pneumoniae NOT DETECTED NOT DETECTED Final  Urine culture     Status: None   Collection Time: 03/07/16  5:08 PM  Result Value Ref Range Status   Specimen Description URINE, CATHETERIZED  Final   Special Requests NONE  Final   Culture NO GROWTH  Final   Report Status 03/08/2016 FINAL  Final  Culture, blood (routine x 2)     Status: None (Preliminary result)   Collection Time: 03/07/16  5:58 PM  Result Value Ref Range Status   Specimen Description BLOOD RIGHT ANTECUBITAL  Final   Special Requests BOTTLES DRAWN AEROBIC AND ANAEROBIC  10CC EA  Final   Culture NO GROWTH 4 DAYS  Final   Report Status PENDING  Incomplete  Culture, blood (routine x 2)     Status: None (Preliminary result)   Collection Time: 03/07/16  6:01 PM  Result Value Ref Range Status   Specimen Description BLOOD LEFT ANTECUBITAL  Final   Special Requests BOTTLES DRAWN AEROBIC AND ANAEROBIC  [place] EA  Final   Culture NO GROWTH 4 DAYS  Final   Report Status PENDING  Incomplete  MRSA PCR Screening     Status: None   Collection Time: 03/11/16  8:53 AM  Result Value Ref Range Status   MRSA by PCR NEGATIVE NEGATIVE Final    Comment:        The GeneXpert MRSA Assay (FDA approved for NASAL specimens only), is one component of a comprehensive MRSA  colonization surveillance program. It is not intended to diagnose MRSA infection nor to guide or monitor treatment for MRSA infections.     Studies/Results: Ct Biopsy  Result Date: 03/12/2016 INDICATION: Leukopenia of uncertain etiology. Please perform CT-guided bone marrow biopsy for tissue diagnostic purposes. EXAM: CT-GUIDED BONE MARROW BIOPSY AND ASPIRATION MEDICATIONS: None ANESTHESIA/SEDATION: Fentanyl 50 mcg IV; Versed 1 mg IV Sedation Time: 11 minutes; The patient was continuously monitored during the procedure by the interventional radiology nurse under my direct supervision. COMPLICATIONS: None immediate. PROCEDURE: Informed consent was obtained from the patient following an explanation of the procedure, risks, benefits and alternatives. The patient understands, agrees and consents for the procedure. All questions were addressed. A time out was performed prior to the initiation of the procedure. The patient was positioned prone and non-contrast localization CT was performed of the pelvis to demonstrate the iliac marrow spaces. The operative site was prepped and draped in the usual sterile fashion. Under sterile conditions and local anesthesia, a 22 gauge spinal needle was utilized for procedural planning. Next, an 11 gauge coaxial bone biopsy needle was advanced into the left iliac marrow space. Needle position was confirmed with CT imaging. Initially, bone marrow aspiration was performed. Next, a bone marrow biopsy was obtained with the 11 gauge outer bone marrow device. The 11 gauge coaxial bone biopsy needle was re-advanced into a slightly different location within the left iliac marrow space, positioning was confirmed and an additional bone marrow biopsy was obtained. Samples were prepared with the cytotechnologist and deemed adequate. No, additional sample was set aside for Gram stain and culture analysis. The needle was removed intact. Hemostasis was obtained with compression and a  dressing was  placed. The patient tolerated the procedure well without immediate post procedural complication. IMPRESSION: Successful CT guided left iliac bone marrow aspiration and core biopsy. Note, an additional sample was set aside for Gram stain and culture analysis. Electronically Signed   By: Sandi Mariscal M.D.   On: 03/12/2016 09:49      Assessment/Plan:  Principal Problem:   Severe sepsis ([place]) Active Problems:   Spastic 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 ([place])   Community acquired pneumonia   UTI (urinary tract infection)   Abnormal LFTs   Buttock wound   Protein calorie malnutrition (HCC)   Pressure injury of skin   Hypokalemia   Hypernatremia   Sepsis secondary to UTI ([place])   Aspiration pneumonia of left lower lobe due to vomit ([place])   Sacral decubitus ulcer, stage III (HCC)   Multiple sclerosis (HCC)   Protein-calorie malnutrition, severe ([place])   Muscle abscess   Myofascitis   Palliative care by specialist   Other neutropenia ([place])   Neutropenia ([place])  Deanna Baldwin is a 41 y.o. female with multiple sclerosis complicated by spastic paraplegia on rituximab hospitalized for acute febrile illness found to have right gluteal pyomyositis and now severe neutropenia.  Right gluteal pyomyositis: Continue doxycyline 100 mg twice daily [Day 11].   Neutropenia: ANC 200 today. Hem/Onc considering drug toxicity as cause.  -Follow-up interpretation of marrow biopsy   LOS: 10 days   Charlott Rakes 03/12/2016, 12:37 PM

## 2016-03-12 NOTE — Progress Notes (Signed)
PROGRESS NOTE    Morena Mckissack  FWY:[id number] DOB: 16-Sep-1975 DOA: 03/02/2016 PCP: [name], MD    Brief Narrative:  41 yo female, with multiple sclerosis presents with altered mentation and fever. Recent respiratory illness. Found by patient's family lethargic with temperature 104. On the initial evaluation found to be septic. Positive urine infection and bibasilar pulmonary infiltrates. Developed worsening sepsis cw leukopenia and elevated lactic acid, antibiotics have been up-escalated. MRI pelvis with multiple soft tissue collections, with myositis right gluteal region, suspected pyomyositis, largest collection 2 cm. Not candidate for IR drainage, currently on conservative therapy.   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)   Community acquired pneumonia   UTI (urinary tract infection)   Abnormal LFTs   Buttock wound   Protein calorie malnutrition (HCC)   Pressure injury of skin   Hypokalemia   Hypernatremia   Sepsis secondary to UTI ([place])   Aspiration pneumonia of left lower lobe due to vomit ([place])   Sacral decubitus ulcer, stage III (HCC)   Multiple sclerosis (HCC)   Protein-calorie malnutrition, severe ([place])   Muscle abscess   Myofascitis   Palliative care by specialist   Other neutropenia ([place])   Neutropenia ([place])    Sepsis due to urine infection, present on admission.  Urine culture positive for e coli pan sensitive. Patient had 6 days of therapy.   Pneumonia bilateral, possible aspiration, present on admission. Aspiration precautions, continue dysphagia 3, mechanical soft with honey thick liquids. Oxymetry monitoring and supplemental 02 per [place] to target 02 sat above 92%.  Right gluteal pyomyositis; on doxycycline per ID recommendation  Pressure ulcers, sacral, present on admission, stage 2 to 3, multiple. Will continue conservative therapy with IV antibiotics for  pelvic myofascitis and gluteal pyomyositis. WBC ar 1.9. Continue vancomycin and doxycylcine.    Multiple sclerosis. Patient has loss follow up for MS at tertiary care center. Family requesting re-evaluation for neurology on this admission.  She has been getting ritaximab , last dose in 01/2016  Neutropenia/agranulocytosis, from ritaximab ? Hematology/oncology Dr Lindi Adie consulted, s/p bone marrow biopsy on 2/9, start gcsf injection daily,   Protein calorie malnutrition. BMI 16. Very deconditioned, palliative care team consulted. Per nutrition ensure bid and magic cup tid.   Hypernatremia. Continue hydration with d5LR  at 75 cc per hour, for maintenance fluids. Follow renal panel in am, avoid hypotension or nephrotoxic medications.   Hypokalemia/hypomagnesemia: replace k/mag.   DVT prophylaxis:enoxaparin Code Status:full  Family Communication:No family at the bedside  Disposition Plan:SNF  Consultants:  Wound care  Infectious disease  Palliative care  Hematology/oncology  Procedures: hydrotherapy   Antimicrobials:   Vancomycin then doxycyclin  Subjective: [name], nad, denies pain currently, no fever, foley inplace with clear urine  Objective: Vitals:   03/11/16 1420 03/11/16 2348 03/12/16 0647 03/12/16 1519  BP:  118/66 (!) 104/58 (!) 108/59  Pulse: (!) 110 (!) 118 (!) 120 (!) 122  Resp: _0 Temp: 99.8 F (37.7 C) 98.9 F (37.2 C) 98.3 F (36.8 C) 98 F (36.7 C)  TempSrc: Oral  Oral Oral  SpO2: 99% 100% 94% 95%  Weight:      Height:        Intake/Output Summary (Last 24 hours) at 03/12/16 1859 Last data filed at 03/12/16 1640  Gross per 24 hour  Intake          1238.75 ml  Output  2150 ml  Net          -911.25 ml   Filed Weights   03/07/16 0500 03/08/16 0500 03/11/16 0500  Weight: 44.5 kg (98 lb) 43.1 kg (95 lb) 42.2 kg (93 lb)    Examination:  General exam: deconditioned, very frail, thin, malnourished  E ENT; no  pallor or icterus, oral mucosa dry.  Respiratory system: Clear to auscultation. Respiratory effort normal. Decreased breath sound at bases.  Cardiovascular system: S1 & S2 heard, RRR. No JVD, murmurs, rubs, gallops or clicks. No pedal edema. Gastrointestinal system: Abdomen is nondistended, soft and nontender. No organomegaly or masses felt. Normal bowel sounds heard. Central nervous system: Alert and oriented. No focal neurological deficits. Extremities: Symmetric 5 x 5 power. Skin: multiple stage 2 and 3 pressure ulcers at the pelvic region.   Data Reviewed: I have personally reviewed following labs and imaging studies  CBC:  Recent Labs Lab 03/08/16 0526 03/09/16 0540 03/10/16 1146 03/11/16 0459 03/12/16 0528  WBC 3.5* 1.9* 2.0* 2.3* 2.6*  NEUTROABS 2.7 0.7* 0.2* 0.0* 0.2*  HGB 9.1* 8.7* 9.1* 8.9* 8.7*  HCT 30.8* 29.0* 30.3* 29.9* 29.0*  MCV 92.5 90.9 89.6 89.8 89.8  PLT 295 267 297 336 696   Basic Metabolic Panel:  Recent Labs Lab 03/08/16 0526 03/09/16 0540 03/10/16 0941 03/10/16 1146 03/11/16 0459 03/12/16 0528  NA 151* 140 141 139  --  141  K 4.2 3.0* 3.5 3.3*  --  3.6  CL 112* 99* 102 102  --  104  CO2 _0 --  25  GLUCOSE 94 93 98 132*  --  104*  BUN 9 5* <5* <5*  --  <5*  CREATININE 0.99 0.67 0.47 0.50  --  0.47  CALCIUM 8.1* 8.2* 8.9 8.3*  --  8.5*  MG 1.8 1.5* 1.5* 1.4* 1.6* 1.7   GFR: Estimated Creatinine Clearance: 62.9 mL/min (by C-G formula based on SCr of 0.47 mg/dL). Liver Function Tests: No results for input(s): AST, ALT, ALKPHOS, BILITOT, PROT, ALBUMIN in the last 168 hours. No results for input(s): LIPASE, AMYLASE in the last 168 hours. No results for input(s): AMMONIA in the last 168 hours. Coagulation Profile: No results for input(s): INR, PROTIME in the last 168 hours. Cardiac Enzymes: No results for input(s): CKTOTAL, CKMB, CKMBINDEX, TROPONINI in the last 168 hours. BNP (last 3 results) No results for input(s): PROBNP in the  last 8760 hours. HbA1C: No results for input(s): HGBA1C in the last 72 hours. CBG:  Recent Labs Lab 03/06/16 0806 03/07/16 0802 03/08/16 0800 03/11/16 0746 03/12/16 0753  GLUCAP 98 115* 115* 95 121*   Lipid Profile: No results for input(s): CHOL, HDL, LDLCALC, TRIG, CHOLHDL, LDLDIRECT in the last 72 hours. Thyroid Function Tests: No results for input(s): TSH, T4TOTAL, FREET4, T3FREE, THYROIDAB in the last 72 hours. Anemia Panel: No results for input(s): VITAMINB12, FOLATE, FERRITIN, TIBC, IRON, RETICCTPCT in the last 72 hours. Sepsis Labs:  Recent Labs Lab 03/07/16 1210 03/08/16 1541  LATICACIDVEN 2.5* 2.7*    Recent Results (from the past 240 hour(s))  Urine culture     Status: Abnormal   Collection Time: 03/02/16  7:29 PM  Result Value Ref Range Status   Specimen Description URINE, CATHETERIZED  Final   Special Requests NONE  Final   Culture >=100,000 COLONIES/mL ESCHERICHIA COLI (A)  Final   Report Status 03/05/2016 FINAL  Final   Organism ID, Bacteria ESCHERICHIA COLI (A)  Final  Susceptibility   Escherichia coli - MIC*    AMPICILLIN <=2 SENSITIVE Sensitive     CEFAZOLIN <=4 SENSITIVE Sensitive     CEFTRIAXONE <=1 SENSITIVE Sensitive     CIPROFLOXACIN <=0.25 SENSITIVE Sensitive     GENTAMICIN <=1 SENSITIVE Sensitive     IMIPENEM <=0.25 SENSITIVE Sensitive     NITROFURANTOIN 32 SENSITIVE Sensitive     TRIMETH/SULFA <=20 SENSITIVE Sensitive     AMPICILLIN/SULBACTAM <=2 SENSITIVE Sensitive     PIP/TAZO <=4 SENSITIVE Sensitive     Extended ESBL NEGATIVE Sensitive     * >=100,000 COLONIES/mL ESCHERICHIA COLI  Respiratory Panel by PCR     Status: None   Collection Time: 03/07/16  5:08 PM  Result Value Ref Range Status   Adenovirus NOT DETECTED NOT DETECTED Final   Coronavirus 229E NOT DETECTED NOT DETECTED Final   Coronavirus HKU1 NOT DETECTED NOT DETECTED Final   Coronavirus NL63 NOT DETECTED NOT DETECTED Final   Coronavirus OC43 NOT DETECTED NOT DETECTED  Final   Metapneumovirus NOT DETECTED NOT DETECTED Final   Rhinovirus / Enterovirus NOT DETECTED NOT DETECTED Final   Influenza A NOT DETECTED NOT DETECTED Final   Influenza B NOT DETECTED NOT DETECTED Final   Parainfluenza Virus 1 NOT DETECTED NOT DETECTED Final   Parainfluenza Virus 2 NOT DETECTED NOT DETECTED Final   Parainfluenza Virus 3 NOT DETECTED NOT DETECTED Final   Parainfluenza Virus 4 NOT DETECTED NOT DETECTED Final   Respiratory Syncytial Virus NOT DETECTED NOT DETECTED Final   Bordetella pertussis NOT DETECTED NOT DETECTED Final   Chlamydophila pneumoniae NOT DETECTED NOT DETECTED Final   Mycoplasma pneumoniae NOT DETECTED NOT DETECTED Final  Urine culture     Status: None   Collection Time: 03/07/16  5:08 PM  Result Value Ref Range Status   Specimen Description URINE, CATHETERIZED  Final   Special Requests NONE  Final   Culture NO GROWTH  Final   Report Status 03/08/2016 FINAL  Final  Culture, blood (routine x 2)     Status: None   Collection Time: 03/07/16  5:58 PM  Result Value Ref Range Status   Specimen Description BLOOD RIGHT ANTECUBITAL  Final   Special Requests BOTTLES DRAWN AEROBIC AND ANAEROBIC  10CC EA  Final   Culture NO GROWTH 5 DAYS  Final   Report Status 03/12/2016 FINAL  Final  Culture, blood (routine x 2)     Status: None   Collection Time: 03/07/16  6:01 PM  Result Value Ref Range Status   Specimen Description BLOOD LEFT ANTECUBITAL  Final   Special Requests BOTTLES DRAWN AEROBIC AND ANAEROBIC  [place] EA  Final   Culture NO GROWTH 5 DAYS  Final   Report Status 03/12/2016 FINAL  Final  MRSA PCR Screening     Status: None   Collection Time: 03/11/16  8:53 AM  Result Value Ref Range Status   MRSA by PCR NEGATIVE NEGATIVE Final    Comment:        The GeneXpert MRSA Assay (FDA approved for NASAL specimens only), is one component of a comprehensive MRSA colonization surveillance program. It is not intended to diagnose MRSA infection nor to guide  or monitor treatment for MRSA infections.   Aerobic/Anaerobic Culture (surgical/deep wound)     Status: None (Preliminary result)   Collection Time: 03/12/16  9:50 AM  Result Value Ref Range Status   Specimen Description ASPIRATE BONE MARROW  Final   Special Requests Normal  Final   Gram Stain  Final    ABUNDANT WBC PRESENT, PREDOMINANTLY MONONUCLEAR NO ORGANISMS SEEN    Culture PENDING  Incomplete   Report Status PENDING  Incomplete         Radiology Studies: Ct Biopsy  Result Date: 2016/03/26 INDICATION: Leukopenia of uncertain etiology. Please perform CT-guided bone marrow biopsy for tissue diagnostic purposes. EXAM: CT-GUIDED BONE MARROW BIOPSY AND ASPIRATION MEDICATIONS: None ANESTHESIA/SEDATION: Fentanyl 50 mcg IV; Versed 1 mg IV Sedation Time: 11 minutes; The patient was continuously monitored during the procedure by the interventional radiology nurse under my direct supervision. COMPLICATIONS: None immediate. PROCEDURE: Informed consent was obtained from the patient following an explanation of the procedure, risks, benefits and alternatives. The patient understands, agrees and consents for the procedure. All questions were addressed. A time out was performed prior to the initiation of the procedure. The patient was positioned prone and non-contrast localization CT was performed of the pelvis to demonstrate the iliac marrow spaces. The operative site was prepped and draped in the usual sterile fashion. Under sterile conditions and local anesthesia, a 22 gauge spinal needle was utilized for procedural planning. Next, an 11 gauge coaxial bone biopsy needle was advanced into the left iliac marrow space. Needle position was confirmed with CT imaging. Initially, bone marrow aspiration was performed. Next, a bone marrow biopsy was obtained with the 11 gauge outer bone marrow device. The 11 gauge coaxial bone biopsy needle was re-advanced into a slightly different location within the left  iliac marrow space, positioning was confirmed and an additional bone marrow biopsy was obtained. Samples were prepared with the cytotechnologist and deemed adequate. No, additional sample was set aside for Gram stain and culture analysis. The needle was removed intact. Hemostasis was obtained with compression and a dressing was placed. The patient tolerated the procedure well without immediate post procedural complication. IMPRESSION: Successful CT guided left iliac bone marrow aspiration and core biopsy. Note, an additional sample was set aside for Gram stain and culture analysis. Electronically Signed   By: Sandi Mariscal M.D.   On: 26-Mar-2016 09:49        Scheduled Meds: . doxycycline  100 mg Oral BID  . [START ON 03/13/2016] enoxaparin (LOVENOX) injection  30 mg Subcutaneous Q24H  . feeding supplement (ENSURE ENLIVE)  237 mL Oral BID BM  . Tbo-filgastrim (GRANIX) SQ  300 mcg Subcutaneous q1800   Continuous Infusions: . dextrose 5% lactated ringers 75 mL/hr at 03-26-2016 1150     LOS: 10 days      Jacinto Keil, MD PhD [facility] Pager [phone]  If 7PM-7AM, please contact night-coverage [email or web address] Password [email or web address] March 26, 2016, 6:59 PM

## 2016-03-12 NOTE — Procedures (Signed)
Technically successful CT guided bone marrow aspiration and biopsy of left iliac crest. EBL: None No immediate complications.    SignedSandi Mariscal [phone] 03/12/2016, 9:25 AM

## 2016-03-12 NOTE — Progress Notes (Signed)
Hematology I reviewed the patient's chart in detail. Severe neutropenia developed while she was in the hospital. At the time of admission her neutrophil count was normal. I suspect medications as possible etiology (vancomycin, doxycycline, Zosyn: All have the adverse effect of neutropenia.) I do agree that the bone marrow biopsy could rule out other causes. I recommend starting Neupogen injections. Granix 480 mg subcutaneous daily. I will meet her later today to discuss the plan.

## 2016-03-13 LAB — CBC WITH DIFFERENTIAL/PLATELET
BASOS ABS: 0 10*3/uL (ref 0.0–0.1)
Basophils Relative: 0 %
EOS ABS: 0.1 10*3/uL (ref 0.0–0.7)
Eosinophils Relative: 1 %
HCT: 27 % — ABNORMAL LOW (ref 36.0–46.0)
Hemoglobin: 8.3 g/dL — ABNORMAL LOW (ref 12.0–15.0)
LYMPHS ABS: 1.7 10*3/uL (ref 0.7–4.0)
Lymphocytes Relative: 13 %
MCH: 27.7 pg (ref 26.0–34.0)
MCHC: 30.7 g/dL (ref 30.0–36.0)
MCV: 90 fL (ref 78.0–100.0)
MONO ABS: 2.4 10*3/uL — AB (ref 0.1–1.0)
Monocytes Relative: 18 %
Neutro Abs: 9 10*3/uL — ABNORMAL HIGH (ref 1.7–7.7)
Neutrophils Relative %: 68 %
PLATELETS: 420 10*3/uL — AB (ref 150–400)
RBC: 3 MIL/uL — AB (ref 3.87–5.11)
RDW: 14.2 % (ref 11.5–15.5)
WBC: 13.2 10*3/uL — AB (ref 4.0–10.5)

## 2016-03-13 LAB — BASIC METABOLIC PANEL
ANION GAP: 7 (ref 5–15)
BUN: <5 mg/dL — ABNORMAL LOW (ref 6–20)
CALCIUM: 8.5 mg/dL — AB (ref 8.9–10.3)
CO2: 26 mmol/L (ref 22–32)
Chloride: 108 mmol/L (ref 101–111)
Creatinine, Ser: 0.49 mg/dL (ref 0.44–1.00)
GFR calc Af Amer: >60 mL/min (ref >60)
GFR calc non Af Amer: >60 mL/min (ref >60)
GLUCOSE: 117 mg/dL — AB (ref 65–99)
POTASSIUM: 3.6 mmol/L (ref 3.5–5.1)
SODIUM: 141 mmol/L (ref 135–145)

## 2016-03-13 LAB — MAGNESIUM: MAGNESIUM: 1.5 mg/dL — AB (ref 1.7–2.4)

## 2016-03-13 LAB — PATHOLOGIST SMEAR REVIEW

## 2016-03-13 MED ORDER — SODIUM CHLORIDE 0.9 % IV BOLUS (SEPSIS)
1000.0000 mL | Freq: Once | INTRAVENOUS | Status: AC
  Administered 2016-03-13: 1000 mL via INTRAVENOUS

## 2016-03-13 NOTE — Progress Notes (Signed)
PROGRESS NOTE    Shenoa Hattabaugh  BSW:[id number] DOB: 11-08-1975 DOA: 03/02/2016 PCP: [name], MD    Brief Narrative:  41 yo female, with multiple sclerosis presents with altered mentation and fever. Recent respiratory illness. Found by patient's family lethargic with temperature 104. On the initial evaluation found to be septic. Positive urine infection and bibasilar pulmonary infiltrates. Developed worsening sepsis cw leukopenia and elevated lactic acid, antibiotics have been up-escalated. MRI pelvis with multiple soft tissue collections, with myositis right gluteal region, suspected pyomyositis, largest collection 2 cm. Not candidate for IR drainage, currently on conservative therapy.   Assessment & Plan:   Principal Problem:   Severe sepsis ([place]) Active Problems:   Spastic 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 ([place])   Community acquired pneumonia   UTI (urinary tract infection)   Abnormal LFTs   Buttock wound   Protein calorie malnutrition (HCC)   Pressure injury of skin   Hypokalemia   Hypernatremia   Sepsis secondary to UTI ([place])   Aspiration pneumonia of left lower lobe due to vomit ([place])   Sacral decubitus ulcer, stage III (HCC)   Multiple sclerosis (HCC)   Protein-calorie malnutrition, severe ([place])   Muscle abscess   Myofascitis   Palliative care by specialist   Other neutropenia ([place])   Neutropenia ([place])    Sepsis due to urine infection, present on admission.  Urine culture positive for e coli pan sensitive. Patient had 6 days of therapy.   Pneumonia bilateral, possible aspiration, present on admission. Aspiration precautions, continue dysphagia 3, mechanical soft with honey thick liquids. Oxymetry monitoring and supplemental 02 per [place] to target 02 sat above 92%.  Right gluteal pyomyositis; on doxycycline per ID recommendation  Pressure ulcers, sacral, present on admission, stage 2 to 3, multiple. Will continue conservative therapy with IV antibiotics for  pelvic myofascitis and gluteal pyomyositis. WBC ar 1.9. Continue vancomycin and doxycylcine.    Multiple sclerosis. Patient has loss follow up for MS at tertiary care center. Family requesting re-evaluation for neurology on this admission.  She has been getting ritaximab , last dose in 01/2016  Neutropenia/agranulocytosis, from ritaximab ? Hematology/oncology Dr Lindi Adie consulted, s/p bone marrow biopsy on 2/9, start gcsf injection daily,   Protein calorie malnutrition. BMI 16. Very deconditioned, palliative care team consulted. Per nutrition ensure bid and magic cup tid.   Hypernatremia. Continue hydration with d5LR  at 75 cc per hour, for maintenance fluids. Follow renal panel in am, avoid hypotension or nephrotoxic medications.   Hypokalemia/hypomagnesemia: replace k/mag.   DVT prophylaxis:enoxaparin Code Status:full  Family Communication:No family at the bedside  Disposition Plan:SNF  Consultants:  Wound care  Infectious disease  Palliative care  Hematology/oncology  Procedures: hydrotherapy   Antimicrobials:   Vancomycin then doxycyclin  Subjective: [name], nad, denies pain currently, no fever, foley inplace with clear urine  Objective: Vitals:   03/12/16 0647 03/12/16 1519 03/12/16 2222 03/13/16 0500  BP: (!) 104/58 (!) 108/59 112/67   Pulse: (!) 120 (!) 122 (!) 123   Resp: _0 Temp: 98.3 F (36.8 C) 98 F (36.7 C) 99.7 F (37.6 C)   TempSrc: Oral Oral Oral   SpO2: 94% 95% 95%   Weight:    46.3 kg (102 lb)  Height:        Intake/Output Summary (Last 24 hours) at 03/13/16 0829 Last data filed at 03/13/16 0603  Gross per 24 hour  Intake             1460 ml  Output             1300 ml  Net              160 ml   Filed Weights   03/08/16 0500 03/11/16 0500 03/13/16 0500  Weight: 43.1 kg (95 lb) 42.2 kg (93 lb) 46.3 kg (102 lb)    Examination:  General exam: deconditioned, very frail, thin, malnourished but oriented x3 E ENT; no  pallor or icterus, oral mucosa dry.  Respiratory system: Clear to auscultation. Respiratory effort normal. Decreased breath sound at bases.  Cardiovascular system: S1 & S2 heard, RRR. No JVD, murmurs, rubs, gallops or clicks. No pedal edema. Gastrointestinal system: Abdomen is nondistended, soft and nontender. No organomegaly or masses felt. Normal bowel sounds heard. Central nervous system: Alert and oriented. No focal neurological deficits. Extremities: Symmetric 5 x 5 power. Skin: multiple stage 2 and 3 pressure ulcers at the pelvic region.   Data Reviewed: I have personally reviewed following labs and imaging studies  CBC:  Recent Labs Lab 03/08/16 0526 03/09/16 0540 03/10/16 1146 03/11/16 0459 03/12/16 0528  WBC 3.5* 1.9* 2.0* 2.3* 2.6*  NEUTROABS 2.7 0.7* 0.2* 0.0* 0.2*  HGB 9.1* 8.7* 9.1* 8.9* 8.7*  HCT 30.8* 29.0* 30.3* 29.9* 29.0*  MCV 92.5 90.9 89.6 89.8 89.8  PLT 295 267 297 336 034   Basic Metabolic Panel:  Recent Labs Lab 03/08/16 0526 03/09/16 0540 03/10/16 0941 03/10/16 1146 03/11/16 0459 03/12/16 0528  NA 151* 140 141 139  --  141  K 4.2 3.0* 3.5 3.3*  --  3.6  CL 112* 99* 102 102  --  104  CO2 _0 --  25  GLUCOSE 94 93 98 132*  --  104*  BUN 9 5* <5* <5*  --  <5*  CREATININE 0.99 0.67 0.47 0.50  --  0.47  CALCIUM 8.1* 8.2* 8.9 8.3*  --  8.5*  MG 1.8 1.5* 1.5* 1.4* 1.6* 1.7   GFR: Estimated Creatinine Clearance: 69 mL/min (by C-G formula based on SCr of 0.47 mg/dL). Liver Function Tests: No results for input(s): AST, ALT, ALKPHOS, BILITOT, PROT, ALBUMIN in the last 168 hours. No results for input(s): LIPASE, AMYLASE in the last 168 hours. No results for input(s): AMMONIA in the last 168 hours. Coagulation Profile: No results for input(s): INR, PROTIME in the last 168 hours. Cardiac Enzymes: No results for input(s): CKTOTAL, CKMB, CKMBINDEX, TROPONINI in the last 168 hours. BNP (last 3 results) No results for input(s): PROBNP in the  last 8760 hours. HbA1C: No results for input(s): HGBA1C in the last 72 hours. CBG:  Recent Labs Lab 03/07/16 0802 03/08/16 0800 03/11/16 0746 03/12/16 0753  GLUCAP 115* 115* 95 121*   Lipid Profile: No results for input(s): CHOL, HDL, LDLCALC, TRIG, CHOLHDL, LDLDIRECT in the last 72 hours. Thyroid Function Tests: No results for input(s): TSH, T4TOTAL, FREET4, T3FREE, THYROIDAB in the last 72 hours. Anemia Panel: No results for input(s): VITAMINB12, FOLATE, FERRITIN, TIBC, IRON, RETICCTPCT in the last 72 hours. Sepsis Labs:  Recent Labs Lab 03/07/16 1210 03/08/16 1541  LATICACIDVEN 2.5* 2.7*    Recent Results (from the past 240 hour(s))  Respiratory Panel by PCR     Status: None   Collection Time: 03/07/16  5:08 PM  Result Value Ref Range Status   Adenovirus NOT DETECTED NOT DETECTED Final   Coronavirus 229E NOT DETECTED NOT DETECTED Final   Coronavirus HKU1 NOT DETECTED NOT DETECTED Final   Coronavirus  NL63 NOT DETECTED NOT DETECTED Final   Coronavirus OC43 NOT DETECTED NOT DETECTED Final   Metapneumovirus NOT DETECTED NOT DETECTED Final   Rhinovirus / Enterovirus NOT DETECTED NOT DETECTED Final   Influenza A NOT DETECTED NOT DETECTED Final   Influenza B NOT DETECTED NOT DETECTED Final   Parainfluenza Virus 1 NOT DETECTED NOT DETECTED Final   Parainfluenza Virus 2 NOT DETECTED NOT DETECTED Final   Parainfluenza Virus 3 NOT DETECTED NOT DETECTED Final   Parainfluenza Virus 4 NOT DETECTED NOT DETECTED Final   Respiratory Syncytial Virus NOT DETECTED NOT DETECTED Final   Bordetella pertussis NOT DETECTED NOT DETECTED Final   Chlamydophila pneumoniae NOT DETECTED NOT DETECTED Final   Mycoplasma pneumoniae NOT DETECTED NOT DETECTED Final  Urine culture     Status: None   Collection Time: 03/07/16  5:08 PM  Result Value Ref Range Status   Specimen Description URINE, CATHETERIZED  Final   Special Requests NONE  Final   Culture NO GROWTH  Final   Report Status  03/08/2016 FINAL  Final  Culture, blood (routine x 2)     Status: None   Collection Time: 03/07/16  5:58 PM  Result Value Ref Range Status   Specimen Description BLOOD RIGHT ANTECUBITAL  Final   Special Requests BOTTLES DRAWN AEROBIC AND ANAEROBIC  10CC EA  Final   Culture NO GROWTH 5 DAYS  Final   Report Status 18-Mar-2016 FINAL  Final  Culture, blood (routine x 2)     Status: None   Collection Time: 03/07/16  6:01 PM  Result Value Ref Range Status   Specimen Description BLOOD LEFT ANTECUBITAL  Final   Special Requests BOTTLES DRAWN AEROBIC AND ANAEROBIC  [place] EA  Final   Culture NO GROWTH 5 DAYS  Final   Report Status 2016/03/18 FINAL  Final  MRSA PCR Screening     Status: None   Collection Time: 03/11/16  8:53 AM  Result Value Ref Range Status   MRSA by PCR NEGATIVE NEGATIVE Final    Comment:        The GeneXpert MRSA Assay (FDA approved for NASAL specimens only), is one component of a comprehensive MRSA colonization surveillance program. It is not intended to diagnose MRSA infection nor to guide or monitor treatment for MRSA infections.   Aerobic/Anaerobic Culture (surgical/deep wound)     Status: None (Preliminary result)   Collection Time: March 18, 2016  9:50 AM  Result Value Ref Range Status   Specimen Description ASPIRATE BONE MARROW  Final   Special Requests Normal  Final   Gram Stain   Final    ABUNDANT WBC PRESENT, PREDOMINANTLY MONONUCLEAR NO ORGANISMS SEEN    Culture PENDING  Incomplete   Report Status PENDING  Incomplete         Radiology Studies: Ct Biopsy  Result Date: 2016-03-18 INDICATION: Leukopenia of uncertain etiology. Please perform CT-guided bone marrow biopsy for tissue diagnostic purposes. EXAM: CT-GUIDED BONE MARROW BIOPSY AND ASPIRATION MEDICATIONS: None ANESTHESIA/SEDATION: Fentanyl 50 mcg IV; Versed 1 mg IV Sedation Time: 11 minutes; The patient was continuously monitored during the procedure by the interventional radiology nurse under my direct  supervision. COMPLICATIONS: None immediate. PROCEDURE: Informed consent was obtained from the patient following an explanation of the procedure, risks, benefits and alternatives. The patient understands, agrees and consents for the procedure. All questions were addressed. A time out was performed prior to the initiation of the procedure. The patient was positioned prone and non-contrast localization CT was performed of the  pelvis to demonstrate the iliac marrow spaces. The operative site was prepped and draped in the usual sterile fashion. Under sterile conditions and local anesthesia, a 22 gauge spinal needle was utilized for procedural planning. Next, an 11 gauge coaxial bone biopsy needle was advanced into the left iliac marrow space. Needle position was confirmed with CT imaging. Initially, bone marrow aspiration was performed. Next, a bone marrow biopsy was obtained with the 11 gauge outer bone marrow device. The 11 gauge coaxial bone biopsy needle was re-advanced into a slightly different location within the left iliac marrow space, positioning was confirmed and an additional bone marrow biopsy was obtained. Samples were prepared with the cytotechnologist and deemed adequate. No, additional sample was set aside for Gram stain and culture analysis. The needle was removed intact. Hemostasis was obtained with compression and a dressing was placed. The patient tolerated the procedure well without immediate post procedural complication. IMPRESSION: Successful CT guided left iliac bone marrow aspiration and core biopsy. Note, an additional sample was set aside for Gram stain and culture analysis. Electronically Signed   By: Sandi Mariscal M.D.   On: 03/12/2016 09:49        Scheduled Meds: . doxycycline  100 mg Oral BID  . enoxaparin (LOVENOX) injection  30 mg Subcutaneous Q24H  . feeding supplement (ENSURE ENLIVE)  237 mL Oral BID BM  . Tbo-filgastrim (GRANIX) SQ  300 mcg Subcutaneous q1800   Continuous  Infusions: . dextrose 5% lactated ringers 75 mL/hr at 03/13/16 0603     LOS: 11 days      Caedon Bond, MD PhD [facility] Pager [phone]  If 7PM-7AM, please contact night-coverage [email or web address] Password [email or web address] 03/13/2016, 8:29 AM

## 2016-03-14 LAB — BASIC METABOLIC PANEL
Anion gap: 11 (ref 5–15)
BUN: <5 mg/dL — ABNORMAL LOW (ref 6–20)
CALCIUM: 8.6 mg/dL — AB (ref 8.9–10.3)
CO2: 27 mmol/L (ref 22–32)
Chloride: 104 mmol/L (ref 101–111)
Creatinine, Ser: 0.48 mg/dL (ref 0.44–1.00)
GLUCOSE: 84 mg/dL (ref 65–99)
Potassium: 3.5 mmol/L (ref 3.5–5.1)
Sodium: 142 mmol/L (ref 135–145)

## 2016-03-14 LAB — CBC WITH DIFFERENTIAL/PLATELET
BASOS ABS: 0 10*3/uL (ref 0.0–0.1)
BASOS PCT: 0 %
EOS ABS: 0 10*3/uL (ref 0.0–0.7)
Eosinophils Relative: 0 %
HEMATOCRIT: 27.6 % — AB (ref 36.0–46.0)
Hemoglobin: 8.2 g/dL — ABNORMAL LOW (ref 12.0–15.0)
LYMPHS PCT: 5 %
Lymphs Abs: 1.5 10*3/uL (ref 0.7–4.0)
MCH: 26.7 pg (ref 26.0–34.0)
MCHC: 29.7 g/dL — AB (ref 30.0–36.0)
MCV: 89.9 fL (ref 78.0–100.0)
MONO ABS: 2.1 10*3/uL — AB (ref 0.1–1.0)
Monocytes Relative: 7 %
NEUTROS PCT: 88 %
Neutro Abs: 26.6 10*3/uL — ABNORMAL HIGH (ref 1.7–7.7)
PLATELETS: 463 10*3/uL — AB (ref 150–400)
RBC: 3.07 MIL/uL — ABNORMAL LOW (ref 3.87–5.11)
RDW: 14 % (ref 11.5–15.5)
WBC: 30.2 10*3/uL — ABNORMAL HIGH (ref 4.0–10.5)

## 2016-03-14 LAB — MAGNESIUM: MAGNESIUM: 1.5 mg/dL — AB (ref 1.7–2.4)

## 2016-03-14 MED ORDER — POTASSIUM CHLORIDE CRYS ER 20 MEQ PO TBCR
40.0000 meq | EXTENDED_RELEASE_TABLET | Freq: Once | ORAL | Status: AC
  Administered 2016-03-14: 40 meq via ORAL
  Filled 2016-03-14: qty 2

## 2016-03-14 MED ORDER — MAGNESIUM SULFATE 2 GM/50ML IV SOLN
2.0000 g | Freq: Once | INTRAVENOUS | Status: AC
  Administered 2016-03-14: 2 g via INTRAVENOUS
  Filled 2016-03-14: qty 50

## 2016-03-14 NOTE — Progress Notes (Signed)
PROGRESS NOTE    Deanna Baldwin  QIO:[id number] DOB: [birth year] DOA: 03/02/2016 PCP: [name], MD    Brief Narrative:  41 yo female, with multiple sclerosis presents with altered mentation and fever. Recent respiratory illness. Found by patient's family lethargic with temperature 104. On the initial evaluation found to be septic. Positive urine infection and bibasilar pulmonary infiltrates. Developed worsening sepsis cw leukopenia and elevated lactic acid, antibiotics have been up-escalated. MRI pelvis with multiple soft tissue collections, with myositis right gluteal region, suspected pyomyositis, largest collection 2 cm. Not candidate for IR drainage, currently on conservative therapy.   Assessment & Plan:   Principal Problem:   Severe sepsis ([place]) Active Problems:   Spastic 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 ([place])   Community acquired pneumonia   UTI (urinary tract infection)   Abnormal LFTs   Buttock wound   Protein-calorie malnutrition (HCC)   Pressure injury of skin   Hypokalemia   Hypernatremia   Sepsis secondary to UTI ([place])   Aspiration pneumonia of left lower lobe due to vomit ([place])   Sacral decubitus ulcer, stage III (HCC)   Multiple sclerosis (HCC)   Protein-calorie malnutrition, severe ([place])   Muscle abscess   Myofascitis   Palliative care by specialist   Other neutropenia ([place])   Neutropenia ([place])    Sepsis due to urine infection, present on admission.  Urine culture positive for e coli pan sensitive. Patient had 6 days of therapy.   Pneumonia bilateral, possible aspiration, present on admission. Aspiration precautions, continue dysphagia 3, mechanical soft with honey thick liquids. Oxymetry monitoring and supplemental 02 per [place] to target 02 sat above 92%.  Right gluteal pyomyositis; on doxycycline per ID recommendation  Pressure ulcers, sacral, present on admission, stage 2 to 3, multiple. Will continue conservative therapy with IV antibiotics for  pelvic myofascitis and gluteal pyomyositis. WBC ar 1.9. Continue vancomycin and doxycylcine.    Multiple sclerosis. Patient has loss follow up for MS at tertiary care center. Family requesting re-evaluation for neurology on this admission.  She has been getting ritaximab , last dose in 01/2016  Neutropenia/agranulocytosis, from ritaximab ? Hematology/oncology Dr Lindi Adie consulted, s/p bone marrow biopsy on 2/9, start gcsf injection daily,  Anc>1000, d/c gcsf on 2/11  Protein calorie malnutrition. BMI 16. Very deconditioned, palliative care team consulted. Per nutrition ensure bid and magic cup tid.   Hypernatremia. Continue hydration with d5LR  at 75 cc per hour, for maintenance fluids. Follow renal panel in am, avoid hypotension or nephrotoxic medications.  Hypomagnesemia:  replace mag   Hypokalemia/hypomagnesemia: replace k/mag.   DVT prophylaxis:enoxaparin Code Status:full  Family Communication:No family at the bedside  Disposition Plan:SNF  Consultants:  Wound care  Infectious disease  Palliative care  Hematology/oncology  Procedures: hydrotherapy   Antimicrobials:   Vancomycin then doxycyclin  Subjective: [name], nad, denies pain currently, no fever, foley inplace with clear urine  Objective: Vitals:   03/13/16 0500 03/13/16 1611 03/13/16 2158 03/14/16 0552  BP:  (!) 98/50 (!) 108/57 103/64  Pulse:  (!) 133 (!) 123 (!) 109  Resp:  (!) 32 18 18  Temp:  99.4 F (37.4 C) 97.9 F (36.6 C) 98.1 F (36.7 C)  TempSrc:  Oral Oral Oral  SpO2:  96% 96% 97%  Weight: 46.3 kg (102 lb)   46.4 kg (102 lb 3.2 oz)  Height:        Intake/Output Summary (Last 24 hours) at 03/14/16 1057 Last data filed at 03/14/16 0949  Gross per 24 hour  Intake  100 ml  Output              850 ml  Net             -750 ml   Filed Weights   03/11/16 0500 03/13/16 0500 03/14/16 0552  Weight: 42.2 kg (93 lb) 46.3 kg (102 lb) 46.4 kg (102 lb 3.2 oz)     Examination:  General exam: deconditioned, very frail, thin, malnourished but oriented x3 E ENT; no pallor or icterus, oral mucosa dry.  Respiratory system: Clear to auscultation. Respiratory effort normal. Decreased breath sound at bases.  Cardiovascular system: S1 & S2 heard, RRR. No JVD, murmurs, rubs, gallops or clicks. No pedal edema. Gastrointestinal system: Abdomen is nondistended, soft and nontender. No organomegaly or masses felt. Normal bowel sounds heard. Central nervous system: Alert and oriented. No focal neurological deficits. Extremities: Symmetric 5 x 5 power. Skin: multiple stage 2 and 3 pressure ulcers at the pelvic region.   Data Reviewed: I have personally reviewed following labs and imaging studies  CBC:  Recent Labs Lab 03/10/16 1146 03/11/16 0459 03/12/16 0528 03/13/16 1057 03/14/16 0508  WBC 2.0* 2.3* 2.6* 13.2* 30.2*  NEUTROABS 0.2* 0.0* 0.2* 9.0* 26.6*  HGB 9.1* 8.9* 8.7* 8.3* 8.2*  HCT 30.3* 29.9* 29.0* 27.0* 27.6*  MCV 89.6 89.8 89.8 90.0 89.9  PLT 297 336 392 420* 778*   Basic Metabolic Panel:  Recent Labs Lab 03/10/16 0941 03/10/16 1146 03/11/16 0459 03/12/16 0528 03/13/16 1057 03/14/16 0508  NA 141 139  --  141 141 142  K 3.5 3.3*  --  3.6 3.6 3.5  CL 102 102  --  104 108 104  CO2 25 27  --  _0 GLUCOSE 98 132*  --  104* 117* 84  BUN <5* <5*  --  <5* <5* <5*  CREATININE 0.47 0.50  --  0.47 0.49 0.48  CALCIUM 8.9 8.3*  --  8.5* 8.5* 8.6*  MG 1.5* 1.4* 1.6* 1.7 1.5* 1.5*   GFR: Estimated Creatinine Clearance: 69.2 mL/min (by C-G formula based on SCr of 0.48 mg/dL). Liver Function Tests: No results for input(s): AST, ALT, ALKPHOS, BILITOT, PROT, ALBUMIN in the last 168 hours. No results for input(s): LIPASE, AMYLASE in the last 168 hours. No results for input(s): AMMONIA in the last 168 hours. Coagulation Profile: No results for input(s): INR, PROTIME in the last 168 hours. Cardiac Enzymes: No results for input(s):  CKTOTAL, CKMB, CKMBINDEX, TROPONINI in the last 168 hours. BNP (last 3 results) No results for input(s): PROBNP in the last 8760 hours. HbA1C: No results for input(s): HGBA1C in the last 72 hours. CBG:  Recent Labs Lab 03/08/16 0800 03/11/16 0746 03/12/16 0753  GLUCAP 115* 95 121*   Lipid Profile: No results for input(s): CHOL, HDL, LDLCALC, TRIG, CHOLHDL, LDLDIRECT in the last 72 hours. Thyroid Function Tests: No results for input(s): TSH, T4TOTAL, FREET4, T3FREE, THYROIDAB in the last 72 hours. Anemia Panel: No results for input(s): VITAMINB12, FOLATE, FERRITIN, TIBC, IRON, RETICCTPCT in the last 72 hours. Sepsis Labs:  Recent Labs Lab 03/07/16 1210 03/08/16 1541  LATICACIDVEN 2.5* 2.7*    Recent Results (from the past 240 hour(s))  Respiratory Panel by PCR     Status: None   Collection Time: 03/07/16  5:08 PM  Result Value Ref Range Status   Adenovirus NOT DETECTED NOT DETECTED Final   Coronavirus 229E NOT DETECTED NOT DETECTED Final   Coronavirus HKU1 NOT DETECTED NOT DETECTED Final  Coronavirus NL63 NOT DETECTED NOT DETECTED Final   Coronavirus OC43 NOT DETECTED NOT DETECTED Final   Metapneumovirus NOT DETECTED NOT DETECTED Final   Rhinovirus / Enterovirus NOT DETECTED NOT DETECTED Final   Influenza A NOT DETECTED NOT DETECTED Final   Influenza B NOT DETECTED NOT DETECTED Final   Parainfluenza Virus 1 NOT DETECTED NOT DETECTED Final   Parainfluenza Virus 2 NOT DETECTED NOT DETECTED Final   Parainfluenza Virus 3 NOT DETECTED NOT DETECTED Final   Parainfluenza Virus 4 NOT DETECTED NOT DETECTED Final   Respiratory Syncytial Virus NOT DETECTED NOT DETECTED Final   Bordetella pertussis NOT DETECTED NOT DETECTED Final   Chlamydophila pneumoniae NOT DETECTED NOT DETECTED Final   Mycoplasma pneumoniae NOT DETECTED NOT DETECTED Final  Urine culture     Status: None   Collection Time: 03/07/16  5:08 PM  Result Value Ref Range Status   Specimen Description URINE,  CATHETERIZED  Final   Special Requests NONE  Final   Culture NO GROWTH  Final   Report Status 03/08/2016 FINAL  Final  Culture, blood (routine x 2)     Status: None   Collection Time: 03/07/16  5:58 PM  Result Value Ref Range Status   Specimen Description BLOOD RIGHT ANTECUBITAL  Final   Special Requests BOTTLES DRAWN AEROBIC AND ANAEROBIC  10CC EA  Final   Culture NO GROWTH 5 DAYS  Final   Report Status 03/12/2016 FINAL  Final  Culture, blood (routine x 2)     Status: None   Collection Time: 03/07/16  6:01 PM  Result Value Ref Range Status   Specimen Description BLOOD LEFT ANTECUBITAL  Final   Special Requests BOTTLES DRAWN AEROBIC AND ANAEROBIC  [place] EA  Final   Culture NO GROWTH 5 DAYS  Final   Report Status 03/12/2016 FINAL  Final  MRSA PCR Screening     Status: None   Collection Time: 03/11/16  8:53 AM  Result Value Ref Range Status   MRSA by PCR NEGATIVE NEGATIVE Final    Comment:        The GeneXpert MRSA Assay (FDA approved for NASAL specimens only), is one component of a comprehensive MRSA colonization surveillance program. It is not intended to diagnose MRSA infection nor to guide or monitor treatment for MRSA infections.   Aerobic/Anaerobic Culture (surgical/deep wound)     Status: None (Preliminary result)   Collection Time: 03/12/16  9:50 AM  Result Value Ref Range Status   Specimen Description ASPIRATE BONE MARROW  Final   Special Requests Normal  Final   Gram Stain   Final    ABUNDANT WBC PRESENT, PREDOMINANTLY MONONUCLEAR NO ORGANISMS SEEN    Culture NO GROWTH 1 DAY  Final   Report Status PENDING  Incomplete         Radiology Studies: No results found.      Scheduled Meds: . doxycycline  100 mg Oral BID  . enoxaparin (LOVENOX) injection  30 mg Subcutaneous Q24H  . feeding supplement (ENSURE ENLIVE)  237 mL Oral BID BM  . magnesium sulfate 1 - 4 g bolus IVPB  2 g Intravenous Once   Continuous Infusions:    LOS: 12 days      Theresia Pree,  MD PhD [facility] Pager [phone]  If 7PM-7AM, please contact night-coverage [email or web address] Password [facility] 03/14/2016, 10:57 AM

## 2016-03-14 NOTE — Progress Notes (Signed)
wbc jumped from 13.2 to 30.2 and neut from 8 to 26.6 today. Provider advised.

## 2016-03-14 NOTE — Progress Notes (Signed)
Patients CBG was 86 at 8 am. Meter wont load

## 2016-03-14 NOTE — Progress Notes (Signed)
Daily Progress Note   Patient Name: Deanna Baldwin       Date: 03/14/2016 DOB: Aug 26, 1975  Age: 41 y.o. MRN#: [id number] Attending Physician: Florencia Reasons, MD Primary Care Physician: [name], MD Admit Date: 03/02/2016  Reason for Consultation/Follow-up: Establishing goals of care and Psychosocial/spiritual support  Subjective:  -continued conversation with patient and her sister Deanna Baldwin  at bedside concerning her current medical condition   -emotional support offered to both patient and her sister Deanna Baldwin, this is an extremely sensitive and difficult situation as patietn faces the physical, financial and emotional and spiritual struggle of living with serious life limiting disease particularly as it relates to her young 31 yo son  -today patient asks to secure a HPOA, names her sister.  I will refer to spiritual care   -patient/family encouraged to call with questions and concerns, will need PCS on discharge for ongoing support  - education offered regarding the natural trajectory of MS and expected sequelae of disease.  Specific attention to issues of dysphagia and artifical feeding and nutritional status as it relates to wound healing.  Both express appreciation for today's conversation  -sister to contact [facility] for information regarding past treatment for MS  Length of Stay: 12  Current Medications: Scheduled Meds:  . doxycycline  100 mg Oral BID  . enoxaparin (LOVENOX) injection  30 mg Subcutaneous Q24H  . feeding supplement (ENSURE ENLIVE)  237 mL Oral BID BM  . Tbo-filgastrim (GRANIX) SQ  300 mcg Subcutaneous q1800    Continuous Infusions:   PRN Meds: acetaminophen **OR** acetaminophen, bisacodyl, HYDROcodone-acetaminophen, ondansetron **OR** ondansetron (ZOFRAN) IV,  polyethylene glycol, RESOURCE THICKENUP CLEAR  Physical Exam  Constitutional: She appears cachectic. She appears ill.  Cardiovascular: Tachycardia present.   Pulmonary/Chest: She has decreased breath sounds in the right lower field and the left lower field.  Musculoskeletal:  -generalized weakness and atrophy   Skin: Skin is warm and dry.  - noted sacral wounds per EMR            Vital Signs: BP 103/64 (BP Location: Left Arm)   Pulse (!) 109   Temp 98.1 F (36.7 C) (Oral)   Resp 18   Ht 5\' 9"  (1.753 m)   Wt 46.4 kg (102 lb 3.2 oz)   LMP 01/28/2016   SpO2 97%   BMI 15.09  kg/m  SpO2: SpO2: 97 % O2 Device: O2 Device: Not Delivered O2 Flow Rate: O2 Flow Rate (L/min): 2 L/min  Intake/output summary:   Intake/Output Summary (Last 24 hours) at 03/14/16 0708 Last data filed at 03/14/16 0500  Gross per 24 hour  Intake                0 ml  Output              850 ml  Net             -850 ml   LBM: Last BM Date: 03/13/16 Baseline Weight: Weight: 49.9 kg (110 lb) Most recent weight: Weight: 46.4 kg (102 lb 3.2 oz)       Palliative Assessment/Data:   30 % at best    Flowsheet Rows   Flowsheet Row Most Recent Value  Intake Tab  Referral Department  Hospitalist  Unit at Time of Referral  Med/Surg Unit  Palliative Care Primary Diagnosis  Sepsis/Infectious Disease  Date Notified  03/07/16  Palliative Care Type  New Palliative care  Reason for referral  Clarify Goals of Care  Date of Admission  03/02/16  # of days IP prior to Palliative referral  5  Clinical Assessment  Psychosocial & Spiritual Assessment  Palliative Care Outcomes      Patient Active Problem List   Diagnosis Date Noted  . Neutropenia ([place])   . Palliative care by specialist   . Other neutropenia ([place])   . Muscle abscess   . Myofascitis   . Sepsis secondary to UTI ([place])   . Aspiration pneumonia of left lower lobe due to vomit ([place])   . Sacral decubitus ulcer, stage III ([place])   . Multiple sclerosis  ([place])   . Protein-calorie malnutrition, severe ([place])   . Hypokalemia 03/05/2016  . Hypernatremia   . Pressure injury of skin 03/03/2016  . 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 Severe sepsis ([place]) 03/02/2016  . Community acquired pneumonia 03/02/2016  . UTI (urinary tract infection) 03/02/2016  . Abnormal LFTs 03/02/2016  . Buttock wound 03/02/2016  . Protein-calorie malnutrition ([place]) 03/02/2016    Palliative Care Assessment & Plan   Patient Profile: 41 y.o. female  admitted on 03/02/2016 with PMH significant for MS with paraplegia/ she is followed at [facility].  Admitted with altered mental status and fever.  She lives at home with her 22 yo son, [name].  She has no in home help.   She is wheel-chair bound, found with sacral wounds  In ER with positive urine culture and chest x-ray positive for pneumonia.  She is frail, with dysphagia and severe protein caloria malnutrition/albumin 1.7.   She currently is receiving rituximab every 6 months, which may be influencing an immunocompromised state.    MRI pelvis with multiple soft tissue collections, with myositis right gluteal region, suspected pyomyositis, ID consulted  Patient faces many major decisions specific to viable treatment options and anticipatory care needs for herself and her young son.  Assessment:  This is the first hospitalization for this patient  with significant disease 2/2 MS diagnosis.  It is somewhat surprising to her that we are talking about concepts specific/AD decsions to and actually expected with progression of MS such as  artifical feeding and hydration, long em placement needs and contemplation of care of her young son  Recommendations/Plan:  Treat the treatable, hopeful for improvement and eventual retrun home  Goals of Care and Additional Recommendations:  Open  to all offered and available medical interventions to prolong life   Code Status:    Code Status Orders         Start     Ordered   03/02/16 2207  Full code  Continuous     03/02/16 2211    Code Status History    Date Active Date Inactive Code Status Order ID Comments User Context   This patient has a current code status but no historical code status.       Prognosis:   Unable to determine  Discharge Planning:  [facility] for rehab with Palliative care service follow-up  Care plan was discussed with DrXu  Thank you for allowing the Palliative Medicine Team to assist in the care of this patient.   Time In: 0930 Time Out: 1030 Total Time 60 min Prolonged Time Billed  no      Greater than 50%  of this time was spent counseling and coordinating care related to the above assessment and plan.  Wadie Lessen, NP  Please contact Palliative Medicine Team phone at [phone] for questions and concerns.

## 2016-03-15 DIAGNOSIS — M791 Myalgia: Secondary | ICD-10-CM | POA: Diagnosis not present

## 2016-03-15 DIAGNOSIS — N1330 Unspecified hydronephrosis: Secondary | ICD-10-CM | POA: Diagnosis not present

## 2016-03-15 DIAGNOSIS — Z8701 Personal history of pneumonia (recurrent): Secondary | ICD-10-CM | POA: Diagnosis not present

## 2016-03-15 DIAGNOSIS — D62 Acute posthemorrhagic anemia: Secondary | ICD-10-CM | POA: Diagnosis not present

## 2016-03-15 DIAGNOSIS — L89153 Pressure ulcer of sacral region, stage 3: Secondary | ICD-10-CM | POA: Diagnosis not present

## 2016-03-15 DIAGNOSIS — R509 Fever, unspecified: Secondary | ICD-10-CM | POA: Diagnosis not present

## 2016-03-15 DIAGNOSIS — D709 Neutropenia, unspecified: Secondary | ICD-10-CM | POA: Diagnosis present

## 2016-03-15 DIAGNOSIS — R339 Retention of urine, unspecified: Secondary | ICD-10-CM | POA: Diagnosis not present

## 2016-03-15 DIAGNOSIS — Z79899 Other long term (current) drug therapy: Secondary | ICD-10-CM | POA: Diagnosis not present

## 2016-03-15 DIAGNOSIS — M609 Myositis, unspecified: Secondary | ICD-10-CM | POA: Diagnosis not present

## 2016-03-15 DIAGNOSIS — B9689 Other specified bacterial agents as the cause of diseases classified elsewhere: Secondary | ICD-10-CM | POA: Diagnosis present

## 2016-03-15 DIAGNOSIS — K6812 Psoas muscle abscess: Secondary | ICD-10-CM | POA: Diagnosis not present

## 2016-03-15 DIAGNOSIS — L89152 Pressure ulcer of sacral region, stage 2: Secondary | ICD-10-CM | POA: Diagnosis not present

## 2016-03-15 DIAGNOSIS — R1312 Dysphagia, oropharyngeal phase: Secondary | ICD-10-CM | POA: Diagnosis not present

## 2016-03-15 DIAGNOSIS — R7989 Other specified abnormal findings of blood chemistry: Secondary | ICD-10-CM | POA: Diagnosis present

## 2016-03-15 DIAGNOSIS — S31809A Unspecified open wound of unspecified buttock, initial encounter: Secondary | ICD-10-CM | POA: Diagnosis not present

## 2016-03-15 DIAGNOSIS — E870 Hyperosmolality and hypernatremia: Secondary | ICD-10-CM | POA: Diagnosis present

## 2016-03-15 DIAGNOSIS — M60009 Infective myositis, unspecified site: Secondary | ICD-10-CM | POA: Diagnosis present

## 2016-03-15 DIAGNOSIS — D638 Anemia in other chronic diseases classified elsewhere: Secondary | ICD-10-CM | POA: Diagnosis present

## 2016-03-15 DIAGNOSIS — R6520 Severe sepsis without septic shock: Secondary | ICD-10-CM | POA: Diagnosis not present

## 2016-03-15 DIAGNOSIS — N10 Acute tubulo-interstitial nephritis: Secondary | ICD-10-CM | POA: Diagnosis not present

## 2016-03-15 DIAGNOSIS — R945 Abnormal results of liver function studies: Secondary | ICD-10-CM | POA: Diagnosis not present

## 2016-03-15 DIAGNOSIS — R319 Hematuria, unspecified: Secondary | ICD-10-CM | POA: Diagnosis not present

## 2016-03-15 DIAGNOSIS — G114 Hereditary spastic paraplegia: Secondary | ICD-10-CM | POA: Diagnosis not present

## 2016-03-15 DIAGNOSIS — J690 Pneumonitis due to inhalation of food and vomit: Secondary | ICD-10-CM | POA: Diagnosis not present

## 2016-03-15 DIAGNOSIS — A419 Sepsis, unspecified organism: Secondary | ICD-10-CM | POA: Diagnosis not present

## 2016-03-15 DIAGNOSIS — L89009 Pressure ulcer of unspecified elbow, unspecified stage: Secondary | ICD-10-CM | POA: Diagnosis not present

## 2016-03-15 DIAGNOSIS — Z818 Family history of other mental and behavioral disorders: Secondary | ICD-10-CM | POA: Diagnosis not present

## 2016-03-15 DIAGNOSIS — S31502A Unspecified open wound of unspecified external genital organs, female, initial encounter: Secondary | ICD-10-CM | POA: Diagnosis not present

## 2016-03-15 DIAGNOSIS — E876 Hypokalemia: Secondary | ICD-10-CM | POA: Diagnosis present

## 2016-03-15 DIAGNOSIS — E43 Unspecified severe protein-calorie malnutrition: Secondary | ICD-10-CM | POA: Diagnosis not present

## 2016-03-15 DIAGNOSIS — Z23 Encounter for immunization: Secondary | ICD-10-CM | POA: Diagnosis not present

## 2016-03-15 DIAGNOSIS — Z681 Body mass index (BMI) 19 or less, adult: Secondary | ICD-10-CM | POA: Diagnosis not present

## 2016-03-15 DIAGNOSIS — D509 Iron deficiency anemia, unspecified: Secondary | ICD-10-CM | POA: Diagnosis present

## 2016-03-15 DIAGNOSIS — N390 Urinary tract infection, site not specified: Secondary | ICD-10-CM | POA: Diagnosis not present

## 2016-03-15 DIAGNOSIS — G8220 Paraplegia, unspecified: Secondary | ICD-10-CM | POA: Diagnosis not present

## 2016-03-15 DIAGNOSIS — Z87891 Personal history of nicotine dependence: Secondary | ICD-10-CM | POA: Diagnosis not present

## 2016-03-15 DIAGNOSIS — N319 Neuromuscular dysfunction of bladder, unspecified: Secondary | ICD-10-CM | POA: Diagnosis not present

## 2016-03-15 DIAGNOSIS — R4182 Altered mental status, unspecified: Secondary | ICD-10-CM | POA: Diagnosis not present

## 2016-03-15 DIAGNOSIS — J189 Pneumonia, unspecified organism: Secondary | ICD-10-CM | POA: Diagnosis not present

## 2016-03-15 DIAGNOSIS — R000 Tachycardia, unspecified: Secondary | ICD-10-CM | POA: Diagnosis present

## 2016-03-15 DIAGNOSIS — G35 Multiple sclerosis: Secondary | ICD-10-CM | POA: Diagnosis not present

## 2016-03-15 DIAGNOSIS — N926 Irregular menstruation, unspecified: Secondary | ICD-10-CM | POA: Diagnosis not present

## 2016-03-15 DIAGNOSIS — R1311 Dysphagia, oral phase: Secondary | ICD-10-CM | POA: Diagnosis not present

## 2016-03-15 LAB — BASIC METABOLIC PANEL
Anion gap: 11 (ref 5–15)
BUN: 6 mg/dL (ref 6–20)
CHLORIDE: 107 mmol/L (ref 101–111)
CO2: 26 mmol/L (ref 22–32)
Calcium: 8.7 mg/dL — ABNORMAL LOW (ref 8.9–10.3)
Creatinine, Ser: 0.41 mg/dL — ABNORMAL LOW (ref 0.44–1.00)
Glucose, Bld: 88 mg/dL (ref 65–99)
Potassium: 3.9 mmol/L (ref 3.5–5.1)
SODIUM: 144 mmol/L (ref 135–145)

## 2016-03-15 LAB — GLUCOSE, CAPILLARY
GLUCOSE-CAPILLARY: 86 mg/dL (ref 65–99)
GLUCOSE-CAPILLARY: 91 mg/dL (ref 65–99)
Glucose-Capillary: 105 mg/dL — ABNORMAL HIGH (ref 65–99)

## 2016-03-15 LAB — CBC WITH DIFFERENTIAL/PLATELET
BASOS ABS: 0 10*3/uL (ref 0.0–0.1)
Band Neutrophils: 10 %
Basophils Relative: 0 %
EOS PCT: 1 %
Eosinophils Absolute: 0.2 10*3/uL (ref 0.0–0.7)
HCT: 28.9 % — ABNORMAL LOW (ref 36.0–46.0)
HEMOGLOBIN: 8.7 g/dL — AB (ref 12.0–15.0)
LYMPHS PCT: 5 %
Lymphs Abs: 1.2 10*3/uL (ref 0.7–4.0)
MCH: 27.3 pg (ref 26.0–34.0)
MCHC: 30.1 g/dL (ref 30.0–36.0)
MCV: 90.6 fL (ref 78.0–100.0)
MONO ABS: 1.2 10*3/uL — AB (ref 0.1–1.0)
Monocytes Relative: 5 %
Neutro Abs: 21.6 10*3/uL — ABNORMAL HIGH (ref 1.7–7.7)
Neutrophils Relative %: 79 %
PLATELETS: 496 10*3/uL — AB (ref 150–400)
RBC: 3.19 MIL/uL — AB (ref 3.87–5.11)
RDW: 14.2 % (ref 11.5–15.5)
WBC: 24.2 10*3/uL — ABNORMAL HIGH (ref 4.0–10.5)

## 2016-03-15 LAB — MAGNESIUM: Magnesium: 1.8 mg/dL (ref 1.7–2.4)

## 2016-03-15 LAB — BONE MARROW EXAM

## 2016-03-15 MED ORDER — BISACODYL 5 MG PO TBEC: 5.0000 mg | DELAYED_RELEASE_TABLET | Freq: Every day | ORAL | 0 refills | Status: DC | PRN

## 2016-03-15 MED ORDER — ENSURE ENLIVE PO LIQD: 237.0000 mL | Freq: Two times a day (BID) | ORAL | 12 refills | Status: DC

## 2016-03-15 MED ORDER — ACETAMINOPHEN 325 MG PO TABS: 650.0000 mg | ORAL_TABLET | Freq: Four times a day (QID) | ORAL | 0 refills | Status: DC | PRN

## 2016-03-15 MED ORDER — DOXYCYCLINE HYCLATE 100 MG PO TABS: 100.0000 mg | ORAL_TABLET | Freq: Two times a day (BID) | ORAL | 0 refills | Status: AC

## 2016-03-15 MED ORDER — POLYETHYLENE GLYCOL 3350 17 G PO PACK: 17.0000 g | PACK | Freq: Every day | ORAL | 0 refills | Status: DC | PRN

## 2016-03-15 MED ORDER — RESOURCE THICKENUP CLEAR PO POWD: ORAL | 0 refills | Status: DC

## 2016-03-15 NOTE — Progress Notes (Signed)
   03/15/16 1130  Clinical Encounter Type  Visited With Patient  Visit Type Follow-up  Spiritual Encounters  Spiritual Needs Emotional  Stress Factors  Patient Stress Factors None identified  Pt not ready yet to complete Advanced Directive.

## 2016-03-15 NOTE — Progress Notes (Signed)
Patient will DC to: [facility]  Anticipated DC date: 03/15/16 Family notified: Aunt Transport by: Corey Harold    Per MD patient ready for DC to [facility]. RN, patient, patient's family, and facility notified of DC. Discharge Summary sent to facility. RN given number for report. DC packet on chart. Ambulance transport requested for patient.   CSW signing off.  Cedric Fishman, [facility] Social Worker [phone]

## 2016-03-15 NOTE — Clinical Social Work Placement (Signed)
   CLINICAL SOCIAL WORK PLACEMENT  NOTE  Date:  03/15/2016  Patient Details  Name: Nitra Dsouza MRN: [id number] Date of Birth: 19-Jun-1975  Clinical Social Work is seeking post-discharge placement for this patient at the [place] level of care (*CSW will initial, date and re-position this form in  chart as items are completed):  Yes   Patient/family provided with [facility] Work Department's list of facilities offering this level of care within the geographic area requested by the patient (or if unable, by the patient's family).  Yes   Patient/family informed of their freedom to choose among providers that offer the needed level of care, that participate in Medicare, Medicaid or managed care program needed by the patient, have an available bed and are willing to accept the patient.  Yes   Patient/family informed of [facility]'s ownership interest in [facility] and [facility], as well as of the fact that they are under no obligation to receive care at these facilities.  PASRR submitted to EDS on 03/04/16     PASRR number received on 03/04/16     Existing PASRR number confirmed on       FL2 transmitted to all facilities in geographic area requested by pt/family on 03/04/16     FL2 transmitted to all facilities within larger geographic area on       Patient informed that his/her managed care company has contracts with or will negotiate with certain facilities, including the following:        Yes   Patient/family informed of bed offers received.  Patient chooses bed at [facility]     Physician recommends and patient chooses bed at      Patient to be transferred to [facility] on 03/15/16.  Patient to be transferred to facility by [facility]     Patient family notified on 03/15/16 of transfer.  Name of family member notified:  Vaughan Basta     PHYSICIAN Please sign FL2, Please prepare prescriptions      Additional Comment:    _______________________________________________ Benard Halsted, [place] 03/15/2016, 11:18 AM

## 2016-03-15 NOTE — Care Management Note (Signed)
Case Management Note  Patient Details  Name: Deanna Baldwin MRN: [id number] Date of Birth: [birth year]  Subjective/Objective:   Admitted with severe sepsis                Action/Plan: Plan is to d/c to SNF today. CSW managing disposition to faciilty.  Expected Discharge Date:  03/15/16               Expected Discharge Plan:  [place]  In-House Referral:  Clinical Social Work  Discharge planning Services  CM Consult  [facility] Agency:     Status of Service:  Completed, signed off  If discussed at [employer], dates discussed:    Additional Comments:  Sharin Mons, RN 03/15/2016, 12:04 PM

## 2016-03-15 NOTE — Discharge Summary (Addendum)
Discharge Summary  Deanna Baldwin CHY:[id number] DOB: 07-06-1975  PCP: [name], MD  Admit date: 03/02/2016 Discharge date: 03/15/2016  Time spent: >39mns, from 10am to 10:35am   Recommendations for Outpatient Follow-up:  1. F/u with SNF MD  for hospital discharge follow up, repeat cbc/bmp at follow up, continue skin/wound care, hydrotherapy 2. F/u with neurology for MS management 3. F/u with hematology  Dr GLindi Adiefor neutropenia and follow up on bone marrow result  Discharge Diagnoses:  Active Hospital Problems   Diagnosis Date Noted  . Severe sepsis ([place] 03/02/2016  . Neutropenia ([place]   . Palliative care by specialist   . Other neutropenia (HB[place]   . Muscle abscess   . Myofascitis   . Sepsis secondary to UTI (HD[place]   . Aspiration pneumonia of left lower lobe due to vomit (HR[place]   . Sacral decubitus ulcer, stage III ([place]   . Multiple sclerosis ([place]   . Protein-calorie malnutrition, severe ([place]   . Hypokalemia 03/05/2016  . Hypernatremia   . Pressure injury of skin 03/03/2016  . 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 Community acquired pneumonia 03/02/2016  . UTI (urinary tract infection) 03/02/2016  . Abnormal LFTs 03/02/2016  . Buttock wound 03/02/2016  . Protein-calorie malnutrition ([place] 03/02/2016    Resolved Hospital Problems   Diagnosis Date Noted Date Resolved  No resolved problems to display.    Discharge Condition: stable  Diet recommendation:   Diet recommendations: Dysphagia 3 (mechanical soft);Honey-thick liquid Liquids provided via: Cup Medication Administration: Whole meds with puree Supervision: Patient able to self feed;Full supervision/cueing for compensatory strategies (set up and feeding assist due to visual disturbances) Compensations: Small sips/bites;Slow rate;Minimize environmental distractions Postural Changes and/or Swallow Maneuvers: Seated upright 90 degrees  Filed Weights   03/13/16 0500 03/14/16  0552 03/15/16 0611  Weight: 46.3 kg (102 lb) 46.4 kg (102 lb 3.2 oz) 46.4 kg (102 lb 4.7 oz)    History of present illness:  Patient coming from: Home  Chief Complaint: AMS, fever   HPI: Deanna Waldois a 41y.o. 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 Patient was reportedly suffering from upper respiratory illness for the last 2-3 weeks, felt as though this had almost resolved, but then noted today re-worsening several days ago. Family had seen her on 02/29/2016 without confusion, but with febrile respiratory illness. Patient didn't answer her phone today, raising concern from her family. She was found at home confused and lethargic with temperature reportedly 104 F. She was brought in to the ED for evaluation of this. Aside from Rituxan infusions, the patient takes no medications and denies alcohol use or illicit substances. There has been no recent fall or head trauma.   ED Course: Upon arrival to the ED, patient is found to be febrile to 38.2 C, saturating 86% on room air, tachypneic, tachycardic in the 130s, and with blood pressure 89/60. EKG features sinus tachycardia with rate 139, left axis deviation, and a diffuse repolarization abnormality. Chest x-ray is notable for patchy bilateral airspace opacities concerning for pneumonia. Chemistry panel is notable for mild elevation in transaminases and total bilirubin which had been normal last month. BC features a leukocytosis to 13,200 and thrombocytosis to 526,000. Urinalysis is consistent with infection and there is microscopic hematuria. Lactic acid is elevated to a value of 3.45. Blood and urine cultures were obtained and the patient  was treated with 30 cc/kg bolus of normal saline. She was started on empiric vancomycin and Zosyn. Tachycardia has improved, mental status has almost returned to  baseline, and blood pressure remains soft, but stable. Patient will be admitted to the telemetry unit for ongoing evaluation and management of severe sepsis suspected secondary to community-acquired pneumonia, and possibly UTI.   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 Urine culture positive for e coli pan sensitive. Patient had 6 days of therapy.   Right gluteal pyomyositis; on doxycycline for total of two weeks (started from 2/8) per ID recommendation  Pneumonia bilateral, possible aspiration, present on admission. Aspiration precautions, continue dysphagia 3, mechanical soft with honey thick liquids. On room air at discharge.    Pressure ulcers, sacral, present on admission, stage 2 to 3, multiple. She received antibiotics for pelvic myofascitis and gluteal pyomyositis.  Continue wound care/hydrotherapy at SNF   Multiple sclerosis. Patient has loss follow up for MS at tertiary care center. .  She has been getting ritaximab , last dose in 01/2016 Follow up with neurology   Neutropenia/agranulocytosis, from ritaximab ? Hematology/oncology Dr Lindi Adie consulted, s/p bone marrow biopsy on 2/9, start gcsf injection daily,  Anc>1000, d/c gcsf on 2/11  Protein calorie malnutrition. BMI 16. Very deconditioned, palliative care  team consulted. Per nutrition ensure bid and magic cup tid.   Hypernatremia. She received hydration with d5LR  at 75 cc per hour, for maintenance fluids. Sodium normalized    Hypokalemia/hypomagnesemia: replace k/mag. k3.9, mag 1.8 at discharge   DVT prophylaxis while in the hospital:enoxaparin Code Status:full  Family Communication:No family at the bedside  Disposition Plan:SNF  Consultants:  Wound care  Infectious disease  Palliative care  Interventional radiology for bone marrow biopsy  Wound care  Hematology/oncology  Procedures: hydrotherapy   Antimicrobials:   Vancomycin then doxycyclin    Discharge Exam: BP 102/65 (BP Location: Left Arm)   Pulse 100   Temp 97.5 F (36.4 C) (Oral)   Resp 18   Ht '5\' 9"'  (1.753 m)   Wt 46.4 kg (102 lb 4.7 oz)   LMP 01/28/2016   SpO2 96%   BMI 15.11 kg/m    General exam: deconditioned, very frail, thin, malnourished but oriented x3 E ENT; no pallor or icterus, oral mucosa dry.  Respiratory system: Clear to auscultation. Respiratory effort normal. Decreased breath sound at bases.  Cardiovascular system: S1 & S2 heard, RRR. No JVD, murmurs, rubs, gallops or clicks. No pedal edema. Gastrointestinal system: Abdomen is nondistended, soft and nontender. No organomegaly or masses felt. Normal bowel sounds heard. Central nervous system: Alert and oriented. No focal neurological deficits. Extremities: Symmetric 5 x 5 power. Skin: multiple stage 2 and 3 pressure ulcers at the pelvic region.    Discharge Instructions You were cared for by  a hospitalist during your hospital stay. If you have any questions about your discharge medications or the care you received while you were in the hospital after you are discharged, you can call the unit and asked to speak with the hospitalist on call if the hospitalist that took care of you is not available. Once you are discharged, your primary care physician will handle any further  medical issues. Please note that NO REFILLS for any discharge medications will be authorized once you are discharged, as it is imperative that you return to your primary care physician (or establish a relationship with a primary care physician if you do not have one) for your aftercare needs so that they can reassess your need for medications and monitor your lab values.  Discharge Instructions    Diet general    Complete by:  As directed    Diet recommendations: Dysphagia 3 (mechanical soft);Honey-thick liquid Liquids provided via: Cup Medication Administration: Whole meds with puree Supervision: Patient able to self feed;Full supervision/cueing for compensatory strategies (set up and feeding assist due to visual disturbances) Compensations: Small sips/bites;Slow rate;Minimize environmental distractions Postural Changes and/or Swallow Maneuvers: Seated upright 90 degrees   Increase activity slowly    Complete by:  As directed      Allergies as of 03/15/2016   No Known Allergies     Medication List    STOP taking these medications   riTUXimab 1,000 mg in sodium chloride 0.9 % 250 mL     TAKE these medications   acetaminophen 325 MG tablet Commonly known as:  TYLENOL Take 2 tablets (650 mg total) by mouth every 6 (six) hours as needed for mild pain (or Fever >/= 101).   bisacodyl 5 MG EC tablet Commonly known as:  DULCOLAX Take 1 tablet (5 mg total) by mouth daily as needed for moderate constipation.   doxycycline 100 MG tablet Commonly known as:  VIBRA-TABS Take 1 tablet (100 mg total) by mouth 2 (two) times daily.   feeding supplement (ENSURE ENLIVE) Liqd Take 237 mLs by mouth 2 (two) times daily between meals.   polyethylene glycol packet Commonly known as:  MIRALAX / GLYCOLAX Take 17 g by mouth daily as needed for mild constipation.   RESOURCE THICKENUP CLEAR Powd For honey thick liquid      No Known Allergies Follow-up Information    Rulon Eisenmenger, MD Follow  up in 2 week(s).   Specialty:  Hematology and Oncology Why:  for bone marrow biopsy result and neutropenia Contact information: [place] [id number] [place], MD Follow up in 3 week(s).   Specialty:  Neurology Why:  for multiple sclerosis Contact information: [place]  [address] [phone]            The results of significant diagnostics from this hospitalization (including imaging, microbiology, ancillary and laboratory) are listed below for reference.    Significant Diagnostic Studies: Mr Lumbar Spine W Wo Contrast  Result Date: 03/07/2016 CLINICAL DATA:  Lumbar spine necrotic wound. Multiple sclerosis with spastic paraplegia. EXAM: MRI LUMBAR SPINE WITHOUT AND WITH CONTRAST TECHNIQUE: Multiplanar and multiecho pulse sequences of the lumbar spine were obtained without and with intravenous contrast. CONTRAST:  38m MULTIHANCE GADOBENATE DIMEGLUMINE 529 MG/ML IV SOLN COMPARISON:  Body CT 06/25/2008 FINDINGS: Segmentation: Transitional lumbosacral vertebra with rudimentary S1-2 interspace, based on the lowest visible ribs. Alignment:  Normal Vertebrae:  No fracture, evidence of discitis, or bone  lesion. Conus medullaris: Extends to the L2 level and appears normal. Thin fat deposition in the filum terminale, incidental. Paraspinal and other soft tissues: Marked over distention of the urinary bladder with bilateral hydroureteronephrosis. The same appearance was present on 2010 abdominal CT. Nonspecific subcutaneous fat reticulation throughout the lumbar back. Negative for abscess or muscular edema. Disc levels: No degenerative changes or impingement. IMPRESSION: 1. Negative for lumbar spine infection or impingement. 2. There is non organized edema in the subcutaneous fat. Negative for abscess. 3. Urinary retention causing bilateral hydroureteronephrosis, also seen by CT in 2010. Electronically Signed   By:  Monte Fantasia M.D.   On: 03/07/2016 17:10   Mr Pelvis W Wo Contrast  Result Date: 03/07/2016 CLINICAL DATA:  Decubitus ulcers. EXAM: MRI PELVIS WITHOUT AND WITH CONTRAST TECHNIQUE: Multiplanar multisequence MR imaging of the pelvis was performed both before and after administration of intravenous contrast. CONTRAST:  78m MULTIHANCE GADOBENATE DIMEGLUMINE 529 MG/ML IV SOLN COMPARISON:  CT scan 06/25/2008. FINDINGS: Urinary Tract: Diffuse subcutaneous soft tissue swelling/ edema/ fluid involving the buttock areas and sacrum bilaterally, likely cellulitis. Shallow sacral decubitus ulcer is noted but no findings for underlying sacral osteomyelitis. No obvious ulcers over the hip or buttock areas. There is diffuse myositis involving the gluteus maximus muscle on the right and also the adductor muscles bilaterally. Small focal fluid collections in the lateral aspects of the gluteus maximus muscles bilaterally with rim enhancement suspicious for pyomyositis. The largest area is on the right side near the greater trochanter and measures 2 cm. No definite MR findings for septic arthritis or osteomyelitis. There is massive distention of the bladder well up into the abdomen to the level of the umbilicus. No pelvic adenopathy or free pelvic fluid collections. Borderline enlarged inguinal lymph nodes bilaterally. Bilateral lower extremity cellulitis and myositis. IMPRESSION: 1. Cellulitis and diffuse myofasciitis involving the pelvic and hip musculature. There also or rim enhancing fluid collections in the gluteus maximus muscles bilaterally concerning for pyomyositis. 2. No findings to suggest septic arthritis or osteomyelitis. 3. Shallow sacral decubitus ulcer but no underlying sacral osteomyelitis. 4. Markedly distended bladder. Electronically Signed   By: PMarijo SanesM.D.   On: 03/07/2016 17:15   Ct Biopsy  Result Date: 03/12/2016 INDICATION: Leukopenia of uncertain etiology. Please perform CT-guided bone marrow  biopsy for tissue diagnostic purposes. EXAM: CT-GUIDED BONE MARROW BIOPSY AND ASPIRATION MEDICATIONS: None ANESTHESIA/SEDATION: Fentanyl 50 mcg IV; Versed 1 mg IV Sedation Time: 11 minutes; The patient was continuously monitored during the procedure by the interventional radiology nurse under my direct supervision. COMPLICATIONS: None immediate. PROCEDURE: Informed consent was obtained from the patient following an explanation of the procedure, risks, benefits and alternatives. The patient understands, agrees and consents for the procedure. All questions were addressed. A time out was performed prior to the initiation of the procedure. The patient was positioned prone and non-contrast localization CT was performed of the pelvis to demonstrate the iliac marrow spaces. The operative site was prepped and draped in the usual sterile fashion. Under sterile conditions and local anesthesia, a 22 gauge spinal needle was utilized for procedural planning. Next, an 11 gauge coaxial bone biopsy needle was advanced into the left iliac marrow space. Needle position was confirmed with CT imaging. Initially, bone marrow aspiration was performed. Next, a bone marrow biopsy was obtained with the 11 gauge outer bone marrow device. The 11 gauge coaxial bone biopsy needle was re-advanced into a slightly different location within the left iliac marrow space, positioning was confirmed  and an additional bone marrow biopsy was obtained. Samples were prepared with the cytotechnologist and deemed adequate. No, additional sample was set aside for Gram stain and culture analysis. The needle was removed intact. Hemostasis was obtained with compression and a dressing was placed. The patient tolerated the procedure well without immediate post procedural complication. IMPRESSION: Successful CT guided left iliac bone marrow aspiration and core biopsy. Note, an additional sample was set aside for Gram stain and culture analysis. Electronically Signed    By: Sandi Mariscal M.D.   On: 03/12/2016 09:49   Dg Chest Port 1 View  Result Date: 03/07/2016 CLINICAL DATA:  Pneumonia. EXAM: PORTABLE CHEST 1 VIEW COMPARISON:  Radiograph of March 02, 2016. FINDINGS: Stable cardiomediastinal silhouette. Right lung is clear. Increased left perihilar and basilar opacity is noted concerning for worsening pneumonia or atelectasis with associated pleural effusion. No pneumothorax is noted. Bony thorax is unremarkable. IMPRESSION: Increase left lung opacity concerning for worsening pneumonia or atelectasis with associated pleural effusion. Electronically Signed   By: Marijo Conception, M.D.   On: 03/07/2016 14:39   Dg Chest Port 1 View  Result Date: 03/02/2016 CLINICAL DATA:  Fever. EXAM: PORTABLE CHEST 1 VIEW COMPARISON:  None. FINDINGS: The heart size and mediastinal contours are within normal limits. No pneumothorax is noted. No significant pleural effusion is noted. Patchy alveolar opacities are noted bilaterally concerning for pneumonia. The visualized skeletal structures are unremarkable. IMPRESSION: Bilateral patchy alveolar opacities are noted concerning for pneumonia. Electronically Signed   By: Marijo Conception, M.D.   On: 03/02/2016 20:05   Dg Swallowing Func-speech Pathology  Result Date: 03/04/2016 Objective Swallowing Evaluation: Type of Study: MBS-Modified Barium Swallow Study Patient Details Name: Shaunita Seney MRN: [id number] Date of Birth: 1975/09/23 Today's Date: 03/04/2016 Time: SLP Start Time (ACUTE ONLY): 1335-SLP Stop Time (ACUTE ONLY): 1355 SLP Time Calculation (min) (ACUTE ONLY): 20 min Past Medical History: Past Medical History: Diagnosis Date . Buttock wound 03/03/2016 . MS (multiple sclerosis) ([place])  . Pneumonia 02/2016 . Protein calorie malnutrition ([place])  . Severe sepsis ([place]) 03/03/2016 . UTI (urinary tract infection) 02/2016 Past Surgical History: Past Surgical History: Procedure Laterality Date . NO PAST SURGERIES   HPI: Ptis a 41 y.o.femalewith  PMH significant for MS with spastic paraplegia, presenting ED for eval of AMS and fever. Pt had upper respiratory illness for last 2-3 weeks, but noted today re-worsening. She was found at home confused and lethargic with temperature reportedly 104 F. Found to be septic and positive for urine infection. CXR from 03/02/2016 showed bilateral patchy alveolar opacities are noted concerning for pneumonia. No Data Recorded Assessment / Plan / Recommendation CHL IP CLINICAL IMPRESSIONS 03/04/2016 Therapy Diagnosis Mild oral phase dysphagia;Moderate pharyngeal phase dysphagia;Severe pharyngeal phase dysphagia Clinical Impression Ms. Peggs exhibited mild oral dysphagia marked by premature spill to valleculae due to decreased oral cohesion. Mod-severe sensorimotor pharyngeal dysphagia characterized by frank silent aspiration with thin and nectar thick liquids. The SLP attempted various compensatory strategies such as a chin tuck and verbal cues to cough/clear throat, which were not effective and did not prevent pt from penetration or aspiration. No airway compromise with honey thick and solids. Due to the pt's fluctuating cognition/awareness and mulitple sclerosis, recommend diet of dysphagia 3 solids, honey thick liquids (no straw), meds whole in puree, and cough/clear throat after every few bites/sips. ST will continue to f/u for treatment to assess pt's safety/efficiency and swallowing function Impact on safety and function Severe aspiration risk   CHL IP  TREATMENT RECOMMENDATION 03/04/2016 Treatment Recommendations Therapy as outlined in treatment plan below   Prognosis 03/04/2016 Prognosis for Safe Diet Advancement Fair Barriers to Reach Goals Cognitive deficits;Severity of deficits Barriers/Prognosis Comment -- CHL IP DIET RECOMMENDATION 03/04/2016 SLP Diet Recommendations Dysphagia 3 (Mech soft) solids;Honey thick liquids Liquid Administration via No straw;Cup Medication Administration Whole meds with puree Compensations  Slow rate;Small sips/bites;Minimize environmental distractions;Clear throat intermittently;Other (Comment) Postural Changes Seated upright at 90 degrees   CHL IP OTHER RECOMMENDATIONS 03/04/2016 Recommended Consults -- Oral Care Recommendations Oral care BID Other Recommendations --   CHL IP FOLLOW UP RECOMMENDATIONS 03/04/2016 Follow up Recommendations Other (comment)   CHL IP FREQUENCY AND DURATION 03/04/2016 Speech Therapy Frequency (ACUTE ONLY) min 2x/week Treatment Duration 2 weeks      CHL IP ORAL PHASE 03/04/2016 Oral Phase Impaired Oral - Pudding Teaspoon -- Oral - Pudding Cup -- Oral - Honey Teaspoon -- Oral - Honey Cup -- Oral - Nectar Teaspoon -- Oral - Nectar Cup Decreased bolus cohesion;Premature spillage Oral - Nectar Straw -- Oral - Thin Teaspoon -- Oral - Thin Cup Premature spillage;Decreased bolus cohesion Oral - Thin Straw -- Oral - Puree -- Oral - Mech Soft -- Oral - Regular -- Oral - Multi-Consistency -- Oral - Pill -- Oral Phase - Comment --  CHL IP PHARYNGEAL PHASE 03/04/2016 Pharyngeal Phase Impaired Pharyngeal- Pudding Teaspoon -- Pharyngeal -- Pharyngeal- Pudding Cup -- Pharyngeal -- Pharyngeal- Honey Teaspoon -- Pharyngeal -- Pharyngeal- Honey Cup WFL Pharyngeal -- Pharyngeal- Nectar Teaspoon -- Pharyngeal -- Pharyngeal- Nectar Cup Penetration/Aspiration before swallow Pharyngeal Material enters airway, passes BELOW cords without attempt by patient to eject out (silent aspiration) Pharyngeal- Nectar Straw -- Pharyngeal -- Pharyngeal- Thin Teaspoon -- Pharyngeal -- Pharyngeal- Thin Cup Penetration/Aspiration before swallow;Penetration/Aspiration during swallow Pharyngeal Material enters airway, passes BELOW cords without attempt by patient to eject out (silent aspiration) Pharyngeal- Thin Straw -- Pharyngeal -- Pharyngeal- Puree -- Pharyngeal -- Pharyngeal- Mechanical Soft -- Pharyngeal -- Pharyngeal- Regular WFL Pharyngeal -- Pharyngeal- Multi-consistency -- Pharyngeal -- Pharyngeal- Pill --  Pharyngeal -- Pharyngeal Comment --  CHL IP CERVICAL ESOPHAGEAL PHASE 03/04/2016 Cervical Esophageal Phase Impaired Pudding Teaspoon -- Pudding Cup -- Honey Teaspoon -- Honey Cup -- Nectar Teaspoon -- Nectar Cup -- Nectar Straw -- Thin Teaspoon -- Thin Cup -- Thin Straw -- Puree -- Mechanical Soft -- Regular -- Multi-consistency -- Pill -- Cervical Esophageal Comment -- No flowsheet data found. [place] Siren 03/04/2016, 3:37 PM Orbie Pyo Colvin Caroli.Ed [employer] [phone]               Microbiology: Recent Results (from the past 240 hour(s))  Respiratory Panel by PCR     Status: None   Collection Time: 03/07/16  5:08 PM  Result Value Ref Range Status   Adenovirus NOT DETECTED NOT DETECTED Final   Coronavirus 229E NOT DETECTED NOT DETECTED Final   Coronavirus HKU1 NOT DETECTED NOT DETECTED Final   Coronavirus NL63 NOT DETECTED NOT DETECTED Final   Coronavirus OC43 NOT DETECTED NOT DETECTED Final   Metapneumovirus NOT DETECTED NOT DETECTED Final   Rhinovirus / Enterovirus NOT DETECTED NOT DETECTED Final   Influenza A NOT DETECTED NOT DETECTED Final   Influenza B NOT DETECTED NOT DETECTED Final   Parainfluenza Virus 1 NOT DETECTED NOT DETECTED Final   Parainfluenza Virus 2 NOT DETECTED NOT DETECTED Final   Parainfluenza Virus 3 NOT DETECTED NOT DETECTED Final   Parainfluenza Virus 4 NOT DETECTED NOT DETECTED Final   Respiratory Syncytial Virus  NOT DETECTED NOT DETECTED Final   Bordetella pertussis NOT DETECTED NOT DETECTED Final   Chlamydophila pneumoniae NOT DETECTED NOT DETECTED Final   Mycoplasma pneumoniae NOT DETECTED NOT DETECTED Final  Urine culture     Status: None   Collection Time: 03/07/16  5:08 PM  Result Value Ref Range Status   Specimen Description URINE, CATHETERIZED  Final   Special Requests NONE  Final   Culture NO GROWTH  Final   Report Status 03/08/2016 FINAL  Final  Culture, blood (routine x 2)     Status: None   Collection Time: 03/07/16  5:58 PM  Result  Value Ref Range Status   Specimen Description BLOOD RIGHT ANTECUBITAL  Final   Special Requests BOTTLES DRAWN AEROBIC AND ANAEROBIC  10CC EA  Final   Culture NO GROWTH 5 DAYS  Final   Report Status 03/12/2016 FINAL  Final  Culture, blood (routine x 2)     Status: None   Collection Time: 03/07/16  6:01 PM  Result Value Ref Range Status   Specimen Description BLOOD LEFT ANTECUBITAL  Final   Special Requests BOTTLES DRAWN AEROBIC AND ANAEROBIC  [place] EA  Final   Culture NO GROWTH 5 DAYS  Final   Report Status 03/12/2016 FINAL  Final  MRSA PCR Screening     Status: None   Collection Time: 03/11/16  8:53 AM  Result Value Ref Range Status   MRSA by PCR NEGATIVE NEGATIVE Final    Comment:        The GeneXpert MRSA Assay (FDA approved for NASAL specimens only), is one component of a comprehensive MRSA colonization surveillance program. It is not intended to diagnose MRSA infection nor to guide or monitor treatment for MRSA infections.   Aerobic/Anaerobic Culture (surgical/deep wound)     Status: None (Preliminary result)   Collection Time: 03/12/16  9:50 AM  Result Value Ref Range Status   Specimen Description ASPIRATE BONE MARROW  Final   Special Requests Normal  Final   Gram Stain   Final    ABUNDANT WBC PRESENT, PREDOMINANTLY MONONUCLEAR NO ORGANISMS SEEN    Culture NO GROWTH 2 DAYS  Final   Report Status PENDING  Incomplete     Labs: Basic Metabolic Panel:  Recent Labs Lab 03/10/16 1146 03/11/16 0459 03/12/16 0528 03/13/16 1057 03/14/16 0508 03/15/16 0614  NA 139  --  141 141 142 144  K 3.3*  --  3.6 3.6 3.5 3.9  CL 102  --  104 108 104 107  CO2 27  --  '25 26 27 26  ' GLUCOSE 132*  --  104* 117* 84 88  BUN <5*  --  <5* <5* <5* 6  CREATININE 0.50  --  0.47 0.49 0.48 0.41*  CALCIUM 8.3*  --  8.5* 8.5* 8.6* 8.7*  MG 1.4* 1.6* 1.7 1.5* 1.5* 1.8   Liver Function Tests: No results for input(s): AST, ALT, ALKPHOS, BILITOT, PROT, ALBUMIN in the last 168 hours. No  results for input(s): LIPASE, AMYLASE in the last 168 hours. No results for input(s): AMMONIA in the last 168 hours. CBC:  Recent Labs Lab 03/11/16 0459 03/12/16 0528 03/13/16 1057 03/14/16 0508 03/15/16 0614  WBC 2.3* 2.6* 13.2* 30.2* 24.2*  NEUTROABS 0.0* 0.2* 9.0* 26.6* 21.6*  HGB 8.9* 8.7* 8.3* 8.2* 8.7*  HCT 29.9* 29.0* 27.0* 27.6* 28.9*  MCV 89.8 89.8 90.0 89.9 90.6  PLT 336 392 420* 463* 496*   Cardiac Enzymes: No results for input(s): CKTOTAL, CKMB, CKMBINDEX, TROPONINI  in the last 168 hours. BNP: BNP (last 3 results) No results for input(s): BNP in the last 8760 hours.  ProBNP (last 3 results) No results for input(s): PROBNP in the last 8760 hours.  CBG:  Recent Labs Lab 03/11/16 0746 03/12/16 0753  GLUCAP 95 121*       SignedFlorencia Reasons MD, PhD  [facility] 03/15/2016, 11:23 AM

## 2016-03-17 ENCOUNTER — Encounter: Payer: Self-pay | Admitting: Adult Health

## 2016-03-17 ENCOUNTER — Non-Acute Institutional Stay (SKILLED_NURSING_FACILITY): Payer: Medicare Other | Admitting: Adult Health

## 2016-03-17 DIAGNOSIS — L89153 Pressure ulcer of sacral region, stage 3: Secondary | ICD-10-CM

## 2016-03-17 DIAGNOSIS — R1311 Dysphagia, oral phase: Secondary | ICD-10-CM | POA: Insufficient documentation

## 2016-03-17 DIAGNOSIS — M609 Myositis, unspecified: Secondary | ICD-10-CM | POA: Diagnosis not present

## 2016-03-17 DIAGNOSIS — J690 Pneumonitis due to inhalation of food and vomit: Secondary | ICD-10-CM | POA: Diagnosis not present

## 2016-03-17 DIAGNOSIS — N390 Urinary tract infection, site not specified: Secondary | ICD-10-CM | POA: Diagnosis not present

## 2016-03-17 DIAGNOSIS — R319 Hematuria, unspecified: Secondary | ICD-10-CM | POA: Diagnosis not present

## 2016-03-17 DIAGNOSIS — E43 Unspecified severe protein-calorie malnutrition: Secondary | ICD-10-CM

## 2016-03-17 DIAGNOSIS — G8220 Paraplegia, unspecified: Secondary | ICD-10-CM

## 2016-03-17 DIAGNOSIS — G35 Multiple sclerosis: Secondary | ICD-10-CM

## 2016-03-17 LAB — BASIC METABOLIC PANEL
BUN: 9 mg/dL (ref 4–21)
Creatinine: 0.4 mg/dL — AB (ref 0.5–1.1)
Glucose: 104 mg/dL
Potassium: 4.7 mmol/L (ref 3.4–5.3)
Sodium: 145 mmol/L (ref 137–147)

## 2016-03-17 LAB — AEROBIC/ANAEROBIC CULTURE (SURGICAL/DEEP WOUND): SPECIAL REQUESTS: NORMAL

## 2016-03-17 LAB — AEROBIC/ANAEROBIC CULTURE W GRAM STAIN (SURGICAL/DEEP WOUND): Culture: NO GROWTH

## 2016-03-17 NOTE — Progress Notes (Signed)
Location:   [facility]    Place of Service:  SNF (31)   CODE STATUS: full code   No Known Allergies  Chief Complaint  Patient presents with  . Hospitalization Follow-up    HPI:  She has had a complicated hospitalization for aspiration pneumonia; uti; right gluteal pyomyositis; neutropenia. She has a history of MS. She is here for short term rehab. She tells me that her goal is to return home. I am not sure if this will be possible for her. She is not voicing any complaints and tells me that she is feeling better. There are no nursing concerns at this time.    Past Medical History:  Diagnosis Date  . Buttock wound 03/03/2016  . MS (multiple sclerosis) ([place])   . Pneumonia 02/2016  . Protein calorie malnutrition ([place])   . Severe sepsis ([place]) 03/03/2016  . UTI (urinary tract infection) 02/2016    Past Surgical History:  Procedure Laterality Date  . NO PAST SURGERIES      Social History   Social History  . Marital status: Single    Spouse name: N/A  . Number of children: N/A  . Years of education: N/A   Occupational History  . Not on file.   Social History Main Topics  . Smoking status: Former Smoker    Packs/day: 1.00    Types: Cigarettes    Quit date: 02/01/2010  . Smokeless tobacco: Never Used  . Alcohol use No  . Drug use: No  . Sexual activity: Not on file   Other Topics Concern  . Not on file   Social History Narrative  . No narrative on file   Family History  Problem Relation Age of Onset  . Mental illness Sister       VITAL SIGNS BP 104/80   Pulse (!) 118   Temp 98 F (36.7 C)   Resp 18   Ht 5' 9" (1.753 m)   Wt 102 lb (46.3 kg)   SpO2 95%   BMI 15.06 kg/m   Patient's Medications  New Prescriptions   No medications on file  Previous Medications   ACETAMINOPHEN (TYLENOL) 325 MG TABLET    Take 2 tablets (650 mg total) by mouth every 6 (six) hours as needed for mild pain (or Fever >/= 101).   BISACODYL (DULCOLAX) 5 MG EC TABLET     Take 1 tablet (5 mg total) by mouth daily as needed for moderate constipation.   DOXYCYCLINE (VIBRA-TABS) 100 MG TABLET    Take 1 tablet (100 mg total) by mouth 2 (two) times daily.   FEEDING SUPPLEMENT, ENSURE ENLIVE, (ENSURE ENLIVE) LIQD    Take 237 mLs by mouth 2 (two) times daily between meals.   MALTODEXTRIN-XANTHAN GUM (RESOURCE THICKENUP CLEAR) POWD    For honey thick liquid   POLYETHYLENE GLYCOL (MIRALAX / GLYCOLAX) PACKET    Take 17 g by mouth daily as needed for mild constipation.  Modified Medications   No medications on file  Discontinued Medications   No medications on file     SIGNIFICANT DIAGNOSTIC EXAMS  03-04-16: swallow study: recommend diet of dysphagia 3 solids, honey thick liquids (no straw), meds whole in puree, and cough/clear throat after every few bites/sips  03-07-16: chest x-ray: Increase left lung opacity concerning for worsening pneumonia or atelectasis with associated pleural effusion.   03-07-16: mri of pelvis: 1. Cellulitis and diffuse myofasciitis involving the pelvic and hip musculature. There also or rim enhancing fluid collections in the  gluteus maximus muscles bilaterally concerning for pyomyositis. 2. No findings to suggest septic arthritis or osteomyelitis. 3. Shallow sacral decubitus ulcer but no underlying sacral osteomyelitis. 4. Markedly distended bladder.  03-07-16: mri of lumbar: 1. Negative for lumbar spine infection or impingement. 2. There is non organized edema in the subcutaneous fat. Negative for abscess. 3. Urinary retention causing bilateral hydroureteronephrosis, also seen by CT in 2010.   03-12-16: bone marrow biopsy: Successful CT guided left iliac bone marrow aspiration and core biopsy. Note, an additional sample was set aside for Gram stain and culture analysis.   LABS REVIEWED:   03-02-16: wbc 13.2; hgb 11.7; hct 37.8; mcv 92.0; plt 526; glucose 163; bun 12; creat 0.77; k+ 3.7; na++ 141; ast 113; alt 55; alk phos 118; total bili 1.7;  albumin 2.2  03-05-16: wbc 2.9; hgb 8.8; hct 29.4; mcv 93.3; plt 360; glucose 110; bun 13; creat 0.87; k+ 3.5 ;na++ 158; mag 2.3 03-07-16: wbc 1.8; hgb 9.3; hct 31.0; mcv 92.3; plt 299; glucose 112; bun 9; creat 0.99; k+ 3.2 ;na++ 143; mag 2.0; urine culture: e-coli; blood cultures: neg; HIV nr 03-12-16: wbc 2.6; hgb 8.7; hct 29.0; mcv 89.8; plt 392; glucose 104; bun <5; creat 0.47; k+ 3.6; na++ 141 03-15-16: wbc 24.2; hgb 8.7; hct 28.9; mcv 90.6; plt 496; glucose 88; bun 6; creat 0.41; k+ 3.9; na++ 144 mag 1.8    Review of Systems  Constitutional: Negative for malaise/fatigue.  Respiratory: Negative for cough and shortness of breath.   Cardiovascular: Negative for chest pain, palpitations and leg swelling.  Gastrointestinal: Negative for abdominal pain, constipation and heartburn.  Genitourinary:       Has foley   Musculoskeletal: Negative for back pain, joint pain and myalgias.  Skin: Negative.        Has skin wounds   Neurological: Negative for dizziness.  Psychiatric/Behavioral: The patient is not nervous/anxious.    Physical Exam  Constitutional: She is oriented to person, place, and time. No distress.  Frail   Eyes: Conjunctivae are normal.  Neck: Neck supple. No JVD present. No thyromegaly present.  Cardiovascular: Normal rate, regular rhythm and intact distal pulses.   Respiratory: Effort normal and breath sounds normal. No respiratory distress. She has no wheezes.  GI: Soft. Bowel sounds are normal. She exhibits no distension. There is no tenderness.  Genitourinary:  Genitourinary Comments: Has foley   Musculoskeletal: She exhibits no edema.  Able to move upper extremities Bilateral lower extremities with splints in place    Lymphadenopathy:    She has no cervical adenopathy.  Neurological: She is alert and oriented to person, place, and time.  Skin: Skin is warm and dry. She is not diaphoretic.  Has stage II-III sacral wounds without signs of infection present.   Psychiatric:  She has a normal mood and affect.     ASSESSMENT/ PLAN:  1. MS: is followed by neurology. 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 Myofascitis: right gluteal: will complete doxycycline will monitor   3. Sacral decubitus stage III: will continue treatment per facility protocol; will be followed by wound Dr.  4. UTI: has long term foley; has sepsis will complete doxycycline will monitor   5. Protein calorie malnutrition: severe: albumin is 2.2 will continue supplements per facility protocol  6. Constipation: will continue miralax daily   7. Neutropenia: has had a bone biopsy; will follow up with oncology  8. Physical  deconditioning: will continue therapy as directed to improve upon her strength; balance; independence with her adls  9. Dysphagia: no further signs of aspiration; has been treated for aspiation pneumonia; will continue honey thick liquids   Will check cbc; cmp; mag  Time spent with patient 50   minutes >50% time spent counseling; reviewing medical record; tests; labs; and developing future plan of care   MD is aware of resident's narcotic use and is in agreement with current plan of care. We will attempt to wean resident as apropriate   Ok Edwards NP [facility]  Contact [phone] Monday through Friday 8am- 5pm  After hours call [phone]

## 2016-03-18 ENCOUNTER — Non-Acute Institutional Stay (SKILLED_NURSING_FACILITY): Payer: Medicare Other | Admitting: Internal Medicine

## 2016-03-18 ENCOUNTER — Encounter: Payer: Self-pay | Admitting: Internal Medicine

## 2016-03-18 DIAGNOSIS — E43 Unspecified severe protein-calorie malnutrition: Secondary | ICD-10-CM

## 2016-03-18 DIAGNOSIS — G8220 Paraplegia, unspecified: Secondary | ICD-10-CM | POA: Diagnosis not present

## 2016-03-18 DIAGNOSIS — G35 Multiple sclerosis: Secondary | ICD-10-CM | POA: Diagnosis not present

## 2016-03-18 DIAGNOSIS — R339 Retention of urine, unspecified: Secondary | ICD-10-CM | POA: Diagnosis not present

## 2016-03-18 DIAGNOSIS — R1311 Dysphagia, oral phase: Secondary | ICD-10-CM | POA: Diagnosis not present

## 2016-03-18 DIAGNOSIS — M609 Myositis, unspecified: Secondary | ICD-10-CM | POA: Diagnosis not present

## 2016-03-18 DIAGNOSIS — L89153 Pressure ulcer of sacral region, stage 3: Secondary | ICD-10-CM | POA: Diagnosis not present

## 2016-03-18 NOTE — Progress Notes (Signed)
Patient ID: Deanna Baldwin, female   DOB: Jan 23, 1976, 41 y.o.   MRN: [id number]    HISTORY AND PHYSICAL   DATE: 03/18/2016  Location:    [facility] Room Number: 120 A Place of Service: SNF (31)   Extended Emergency Contact Information Primary Emergency Contact: Dalbert May[place], [place] [address] Johnnette Litter of [place] Phone: [phone] Mobile Phone: [phone] Relation: Aunt Secondary Emergency Contact: Smith,Crystal  [place] Mobile Phone: [phone] Relation: Sister  Advanced Directive information Does Patient Have a Medical Advance Directive?: No, Would patient like information on creating a medical advance directive?: No - Patient declined  Chief Complaint  Patient presents with  . 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 CXR showed b/l patchy airspace opacities. In ED, Tmax 38.2C; O2 sats 86%; SBP 89; lactic acid level 3.45. She was tx empirically with IV zosyn/vanco. Urine cx (+) E coli. She was tx with 2 weeks po Doxycycline for right gluteal pyomyositis. BMI 16 on admission. Bone marrow bx performed on 2/9th and she rec'd daily GCSF injection. GCSF stopped 2/11th for ANC >1000. AST 113-->84; ALT 55-->42; albumin 1.7; Tbilirubin 1.7-->0.5; Cr 0.41; Mg 1.4-->1.8; Na 158-->144; K 2.6-->3.9; WBC 13.2K-->1.8K-->24.2K; abs neutrophils 11.8K-->0K-->9K; Hgb 11.7-->8.2-->8.7; HIV nonreactive; C diff neg; flu panel neg; respiratory panel neg; urin strep Ag neg at d/c. She presents to SNF for short term rehab  Today she reports feeling better. No f/c. Weakness present. Appetite reduced but unchanged. ST  following her. No nursing concerns. No falls.  MS - followed by neurology. She is on Rituxan. Has bilateral lower extremity splints in place for spastic limbs. 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 Stable withou medications; uses bilateral splints  Sacral decubitus - stage III. Wound care per facility protocol  Severe Protein calorie malnutrition -  albumin 1.7. She gets nutritional supplements per facility protocol  Constipation - stable  miralax daily   Dysphagia - no signs of aspiration; has been treated for aspiation pneumonia. Current diet: honey thick liquids  Past Medical History:  Diagnosis Date  . Buttock wound 03/03/2016  . MS (multiple sclerosis) ([place])   . Pneumonia 02/2016  . Protein calorie malnutrition ([place])   . Severe sepsis ([place]) 03/03/2016  . UTI (urinary tract infection) 02/2016    Past Surgical History:  Procedure Laterality Date  . NO PAST SURGERIES      Patient Care Team: [name], MD as PCP - General (Neurology)  Social History   Social History  . Marital status: Single    Spouse name: N/A  . Number of children: N/A  . Years of education: N/A   Occupational History  . Not on file.   Social History Main Topics  . Smoking status: Former Smoker    Packs/day: 1.00    Types: Cigarettes    Quit date: 02/01/2010  . Smokeless tobacco: Never Used  . Alcohol use No  . Drug use: No  . Sexual activity: Not on file   Other Topics Concern  . Not on file   Social History Narrative  . No narrative on  file     reports that she quit smoking about 6 years ago. Her smoking use included Cigarettes. She smoked 1.00 pack per day. She has never used smokeless tobacco. She reports that she does not drink alcohol or use drugs.  Family History  Problem Relation Age of Onset  . Mental illness Sister    Family Status  Relation Status  . Mother Deceased  . Father Deceased  . Sister Alive  . Maternal  Grandmother Deceased  . Maternal Grandfather Deceased  . Paternal Grandmother Deceased  . Paternal Grandfather Deceased  . Sister Alive    Immunization History  Administered Date(s) Administered  . Influenza,inj,Quad PF,36+ Mos 03/06/2016    No Known Allergies  Medications: Patient's Medications  New Prescriptions   No medications on file  Previous Medications   ACETAMINOPHEN (TYLENOL) 325 MG TABLET    Take 2 tablets (650 mg total) by mouth every 6 (six) hours as needed for mild pain (or Fever >/= 101).   BISACODYL (DULCOLAX) 5 MG EC TABLET    Take 1 tablet (5 mg total) by mouth daily as needed for moderate constipation.   DOXYCYCLINE (VIBRA-TABS) 100 MG TABLET    Take 1 tablet (100 mg total) by mouth 2 (two) times daily.   FEEDING SUPPLEMENT, ENSURE ENLIVE, (ENSURE ENLIVE) LIQD    Take 237 mLs by mouth 2 (two) times daily between meals.   MALTODEXTRIN-XANTHAN GUM (RESOURCE THICKENUP CLEAR) POWD    For honey thick liquid  Modified Medications   No medications on file  Discontinued Medications   POLYETHYLENE GLYCOL (MIRALAX / GLYCOLAX) PACKET    Take 17 g by mouth daily as needed for mild constipation.    Review of Systems  Musculoskeletal: Positive for gait problem.  Skin: Positive for wound.  Neurological: Positive for weakness.  All other systems reviewed and are negative.   Vitals:   03/18/16 1016  BP: 118/62  Pulse: 68  Resp: 18  Temp: 97.6 F (36.4 C)  TempSrc: Oral  SpO2: 99%  Weight: 95 lb (43.1 kg)  Height: _0  (1.753 m)   Body mass index is 14.03 kg/m.  Physical Exam  Constitutional: She is oriented to person, place, and time. She appears cachectic. No distress.  Frail appearing in NAD, sitting up in bed  HENT:  Mouth/Throat: Oropharynx is clear and moist. No oropharyngeal exudate.  Eyes: Pupils are equal, round, and reactive to light. No scleral icterus.  Neck: Neck supple. Carotid bruit is not present. No tracheal deviation present. No  thyromegaly present.  Cardiovascular: Regular rhythm, normal heart sounds and intact distal pulses.  Tachycardia present.  Exam reveals no gallop and no friction rub.   No murmur heard. No LE edema b/l. no calf TTP.   Pulmonary/Chest: Effort normal. No stridor. No respiratory distress. She has no wheezes. She has rales (left base).  Abdominal: Soft. Bowel sounds are normal. She exhibits no distension and no mass. There is no hepatomegaly. There is no tenderness. There is no rebound and no guarding.  Genitourinary:  Genitourinary Comments: Foley cath intact and DTG clear yellow urine  Musculoskeletal: She exhibits edema (left knee immobilizer intact). She exhibits no tenderness.  Lymphadenopathy:    She has no cervical adenopathy.  Neurological: She is alert and oriented to person, place, and time. She displays atrophy. She exhibits abnormal muscle tone.  Spastic quadriplegia  Skin: Skin is warm and dry. No rash noted.  Psychiatric: She has a normal mood and affect. Her behavior is normal.  Judgment and thought content normal.     Labs reviewed: Admission on 03/02/2016, Discharged on 03/15/2016  No results displayed because visit has over 200 results.  CBC Latest Ref Rng & Units 03/15/2016 03/14/2016 03/13/2016  WBC 4.0 - 10.5 K/uL 24.2(H) 30.2(H) 13.2(H)  Hemoglobin 12.0 - 15.0 g/dL 8.7(L) 8.2(L) 8.3(L)  Hematocrit 36.0 - 46.0 % 28.9(L) 27.6(L) 27.0(L)  Platelets 150 - 400 K/uL 496(H) 463(H) 420(H)   CMP Latest Ref Rng & Units 03/17/2016 03/15/2016 03/14/2016  Glucose 65 - 99 mg/dL - 88 84  BUN 4 - 21 mg/dL 9 6 <5(L)  Creatinine 0.5 - 1.1 mg/dL 0.4(A) 0.41(L) 0.48  Sodium 137 - 147 mmol/L 145 144 142  Potassium 3.4 - 5.3 mmol/L 4.7 3.9 3.5  Chloride 101 - 111 mmol/L - 107 104  CO2 22 - 32 mmol/L - 26 27  Calcium 8.9 - 10.3 mg/dL - 8.7(L) 8.6(L)  Total Protein 6.5 - 8.1 g/dL - - -  Total Bilirubin 0.3 - 1.2 mg/dL - - -  Alkaline Phos 38 - 126 U/L - - -  AST 15 - 41 U/L - - -  ALT 14 -  54 U/L - - -   .No results found for: HGBA1C Lipid Panel  No results found for: CHOL, TRIG, HDL, Janene Harvey [facility], [facility]    Hospital Outpatient Visit on 03/12/2016  Component Date Value Ref Range Status  . Specimen Description 03/12/2016 ASPIRATE BONE MARROW   Final  . Special Requests 03/12/2016 Normal   Final  . Gram Stain 03/12/2016    Final                   Value:ABUNDANT WBC PRESENT, PREDOMINANTLY MONONUCLEAR NO ORGANISMS SEEN   . Culture 03/12/2016 NO GROWTH 5 DAYS   Final  . Report Status 03/12/2016 03/17/2016 FINAL   Final  . Bone Marrow Exam 03/12/2016 SEE PATHOLOGY REPORT   Final  . Glucose-Capillary 03/14/2016 86  65 - 99 mg/dL Final  . Glucose-Capillary 03/15/2016 91  65 - 99 mg/dL Final  . Glucose-Capillary 03/13/2016 105* 65 - 99 mg/dL Final    Mr Lumbar Spine W Wo Contrast  Result Date: 03/07/2016 CLINICAL DATA:  Lumbar spine necrotic wound. Multiple sclerosis with spastic paraplegia. EXAM: MRI LUMBAR SPINE WITHOUT AND WITH CONTRAST TECHNIQUE: Multiplanar and multiecho pulse sequences of the lumbar spine were obtained without and with intravenous contrast. CONTRAST:  2m MULTIHANCE GADOBENATE DIMEGLUMINE 529 MG/ML IV SOLN COMPARISON:  Body CT 06/25/2008 FINDINGS: Segmentation: Transitional lumbosacral vertebra with rudimentary S1-2 interspace, based on the lowest visible ribs. Alignment:  Normal Vertebrae:  No fracture, evidence of discitis, or bone lesion. Conus medullaris: Extends to the L2 level and appears normal. Thin fat deposition in the filum terminale, incidental. Paraspinal and other soft tissues: Marked over distention of the urinary bladder with bilateral hydroureteronephrosis. The same appearance was present on 2010 abdominal CT. Nonspecific subcutaneous fat reticulation throughout the lumbar back. Negative for abscess or muscular edema. Disc levels: No degenerative changes or impingement. IMPRESSION: 1. Negative for lumbar spine infection or  impingement. 2. There is non organized edema in the subcutaneous fat. Negative for abscess. 3. Urinary retention causing bilateral hydroureteronephrosis, also seen by CT in 2010. Electronically Signed   By: JMonte FantasiaM.D.   On: 03/07/2016 17:10   Mr Pelvis W Wo Contrast  Result Date: 03/07/2016 CLINICAL DATA:  Decubitus ulcers. EXAM: MRI PELVIS WITHOUT AND WITH CONTRAST TECHNIQUE: Multiplanar multisequence MR imaging of the pelvis was performed  both before and after administration of intravenous contrast. CONTRAST:  69m MULTIHANCE GADOBENATE DIMEGLUMINE 529 MG/ML IV SOLN COMPARISON:  CT scan 06/25/2008. FINDINGS: Urinary Tract: Diffuse subcutaneous soft tissue swelling/ edema/ fluid involving the buttock areas and sacrum bilaterally, likely cellulitis. Shallow sacral decubitus ulcer is noted but no findings for underlying sacral osteomyelitis. No obvious ulcers over the hip or buttock areas. There is diffuse myositis involving the gluteus maximus muscle on the right and also the adductor muscles bilaterally. Small focal fluid collections in the lateral aspects of the gluteus maximus muscles bilaterally with rim enhancement suspicious for pyomyositis. The largest area is on the right side near the greater trochanter and measures 2 cm. No definite MR findings for septic arthritis or osteomyelitis. There is massive distention of the bladder well up into the abdomen to the level of the umbilicus. No pelvic adenopathy or free pelvic fluid collections. Borderline enlarged inguinal lymph nodes bilaterally. Bilateral lower extremity cellulitis and myositis. IMPRESSION: 1. Cellulitis and diffuse myofasciitis involving the pelvic and hip musculature. There also or rim enhancing fluid collections in the gluteus maximus muscles bilaterally concerning for pyomyositis. 2. No findings to suggest septic arthritis or osteomyelitis. 3. Shallow sacral decubitus ulcer but no underlying sacral osteomyelitis. 4. Markedly  distended bladder. Electronically Signed   By: PMarijo SanesM.D.   On: 03/07/2016 17:15   Ct Biopsy  Result Date: 03/12/2016 INDICATION: Leukopenia of uncertain etiology. Please perform CT-guided bone marrow biopsy for tissue diagnostic purposes. EXAM: CT-GUIDED BONE MARROW BIOPSY AND ASPIRATION MEDICATIONS: None ANESTHESIA/SEDATION: Fentanyl 50 mcg IV; Versed 1 mg IV Sedation Time: 11 minutes; The patient was continuously monitored during the procedure by the interventional radiology nurse under my direct supervision. COMPLICATIONS: None immediate. PROCEDURE: Informed consent was obtained from the patient following an explanation of the procedure, risks, benefits and alternatives. The patient understands, agrees and consents for the procedure. All questions were addressed. A time out was performed prior to the initiation of the procedure. The patient was positioned prone and non-contrast localization CT was performed of the pelvis to demonstrate the iliac marrow spaces. The operative site was prepped and draped in the usual sterile fashion. Under sterile conditions and local anesthesia, a 22 gauge spinal needle was utilized for procedural planning. Next, an 11 gauge coaxial bone biopsy needle was advanced into the left iliac marrow space. Needle position was confirmed with CT imaging. Initially, bone marrow aspiration was performed. Next, a bone marrow biopsy was obtained with the 11 gauge outer bone marrow device. The 11 gauge coaxial bone biopsy needle was re-advanced into a slightly different location within the left iliac marrow space, positioning was confirmed and an additional bone marrow biopsy was obtained. Samples were prepared with the cytotechnologist and deemed adequate. No, additional sample was set aside for Gram stain and culture analysis. The needle was removed intact. Hemostasis was obtained with compression and a dressing was placed. The patient tolerated the procedure well without immediate  post procedural complication. IMPRESSION: Successful CT guided left iliac bone marrow aspiration and core biopsy. Note, an additional sample was set aside for Gram stain and culture analysis. Electronically Signed   By: JSandi MariscalM.D.   On: 03/12/2016 09:49   Dg Chest Port 1 View  Result Date: 03/07/2016 CLINICAL DATA:  Pneumonia. EXAM: PORTABLE CHEST 1 VIEW COMPARISON:  Radiograph of March 02, 2016. FINDINGS: Stable cardiomediastinal silhouette. Right lung is clear. Increased left perihilar and basilar opacity is noted concerning for worsening pneumonia or atelectasis with associated  pleural effusion. No pneumothorax is noted. Bony thorax is unremarkable. IMPRESSION: Increase left lung opacity concerning for worsening pneumonia or atelectasis with associated pleural effusion. Electronically Signed   By: Marijo Conception, M.D.   On: 03/07/2016 14:39   Dg Chest Port 1 View  Result Date: 03/02/2016 CLINICAL DATA:  Fever. EXAM: PORTABLE CHEST 1 VIEW COMPARISON:  None. FINDINGS: The heart size and mediastinal contours are within normal limits. No pneumothorax is noted. No significant pleural effusion is noted. Patchy alveolar opacities are noted bilaterally concerning for pneumonia. The visualized skeletal structures are unremarkable. IMPRESSION: Bilateral patchy alveolar opacities are noted concerning for pneumonia. Electronically Signed   By: Marijo Conception, M.D.   On: 03/02/2016 20:05   Dg Swallowing Func-speech Pathology  Result Date: 03/04/2016 Objective Swallowing Evaluation: Type of Study: MBS-Modified Barium Swallow Study Patient Details Name: Jatziri Goffredo MRN: [id number] Date of Birth: 05-15-1975 Today's Date: 03/04/2016 Time: SLP Start Time (ACUTE ONLY): 1335-SLP Stop Time (ACUTE ONLY): 1355 SLP Time Calculation (min) (ACUTE ONLY): 20 min Past Medical History: Past Medical History: Diagnosis Date . Buttock wound 03/03/2016 . MS (multiple sclerosis) ([place])  . Pneumonia 02/2016 . Protein calorie  malnutrition ([place])  . Severe sepsis ([place]) 03/03/2016 . UTI (urinary tract infection) 02/2016 Past Surgical History: Past Surgical History: Procedure Laterality Date . NO PAST SURGERIES   HPI: Ptis a 41 y.o.femalewith PMH significant for MS with spastic paraplegia, presenting ED for eval of AMS and fever. Pt had upper respiratory illness for last 2-3 weeks, but noted today re-worsening. She was found at home confused and lethargic with temperature reportedly 104 F. Found to be septic and positive for urine infection. CXR from 03/02/2016 showed bilateral patchy alveolar opacities are noted concerning for pneumonia. No Data Recorded Assessment / Plan / Recommendation CHL IP CLINICAL IMPRESSIONS 03/04/2016 Therapy Diagnosis Mild oral phase dysphagia;Moderate pharyngeal phase dysphagia;Severe pharyngeal phase dysphagia Clinical Impression Ms. Sheerin exhibited mild oral dysphagia marked by premature spill to valleculae due to decreased oral cohesion. Mod-severe sensorimotor pharyngeal dysphagia characterized by frank silent aspiration with thin and nectar thick liquids. The SLP attempted various compensatory strategies such as a chin tuck and verbal cues to cough/clear throat, which were not effective and did not prevent pt from penetration or aspiration. No airway compromise with honey thick and solids. Due to the pt's fluctuating cognition/awareness and mulitple sclerosis, recommend diet of dysphagia 3 solids, honey thick liquids (no straw), meds whole in puree, and cough/clear throat after every few bites/sips. ST will continue to f/u for treatment to assess pt's safety/efficiency and swallowing function Impact on safety and function Severe aspiration risk   CHL IP TREATMENT RECOMMENDATION 03/04/2016 Treatment Recommendations Therapy as outlined in treatment plan below   Prognosis 03/04/2016 Prognosis for Safe Diet Advancement Fair Barriers to Reach Goals Cognitive deficits;Severity of deficits Barriers/Prognosis  Comment -- CHL IP DIET RECOMMENDATION 03/04/2016 SLP Diet Recommendations Dysphagia 3 (Mech soft) solids;Honey thick liquids Liquid Administration via No straw;Cup Medication Administration Whole meds with puree Compensations Slow rate;Small sips/bites;Minimize environmental distractions;Clear throat intermittently;Other (Comment) Postural Changes Seated upright at 90 degrees   CHL IP OTHER RECOMMENDATIONS 03/04/2016 Recommended Consults -- Oral Care Recommendations Oral care BID Other Recommendations --   CHL IP FOLLOW UP RECOMMENDATIONS 03/04/2016 Follow up Recommendations Other (comment)   CHL IP FREQUENCY AND DURATION 03/04/2016 Speech Therapy Frequency (ACUTE ONLY) min 2x/week Treatment Duration 2 weeks      CHL IP ORAL PHASE 03/04/2016 Oral Phase Impaired Oral - Pudding Teaspoon -- Oral -  Pudding Cup -- Oral - Honey Teaspoon -- Oral - Honey Cup -- Oral - Nectar Teaspoon -- Oral - Nectar Cup Decreased bolus cohesion;Premature spillage Oral - Nectar Straw -- Oral - Thin Teaspoon -- Oral - Thin Cup Premature spillage;Decreased bolus cohesion Oral - Thin Straw -- Oral - Puree -- Oral - Mech Soft -- Oral - Regular -- Oral - Multi-Consistency -- Oral - Pill -- Oral Phase - Comment --  CHL IP PHARYNGEAL PHASE 03/04/2016 Pharyngeal Phase Impaired Pharyngeal- Pudding Teaspoon -- Pharyngeal -- Pharyngeal- Pudding Cup -- Pharyngeal -- Pharyngeal- Honey Teaspoon -- Pharyngeal -- Pharyngeal- Honey Cup WFL Pharyngeal -- Pharyngeal- Nectar Teaspoon -- Pharyngeal -- Pharyngeal- Nectar Cup Penetration/Aspiration before swallow Pharyngeal Material enters airway, passes BELOW cords without attempt by patient to eject out (silent aspiration) Pharyngeal- Nectar Straw -- Pharyngeal -- Pharyngeal- Thin Teaspoon -- Pharyngeal -- Pharyngeal- Thin Cup Penetration/Aspiration before swallow;Penetration/Aspiration during swallow Pharyngeal Material enters airway, passes BELOW cords without attempt by patient to eject out (silent aspiration)  Pharyngeal- Thin Straw -- Pharyngeal -- Pharyngeal- Puree -- Pharyngeal -- Pharyngeal- Mechanical Soft -- Pharyngeal -- Pharyngeal- Regular WFL Pharyngeal -- Pharyngeal- Multi-consistency -- Pharyngeal -- Pharyngeal- Pill -- Pharyngeal -- Pharyngeal Comment --  CHL IP CERVICAL ESOPHAGEAL PHASE 03/04/2016 Cervical Esophageal Phase Impaired Pudding Teaspoon -- Pudding Cup -- Honey Teaspoon -- Honey Cup -- Nectar Teaspoon -- Nectar Cup -- Nectar Straw -- Thin Teaspoon -- Thin Cup -- Thin Straw -- Puree -- Mechanical Soft -- Regular -- Multi-consistency -- Pill -- Cervical Esophageal Comment -- No flowsheet data found. Houston Siren 03/04/2016, 3:37 PM Orbie Pyo Colvin Caroli.Ed CCC-SLP Pager [phone]                Assessment/Plan   ICD-9-CM ICD-10-CM   1. Myofascitis 729.1 M60.9   2. Multiple sclerosis ([place]) 340 G35   3. 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) 344.1 G82.20    340 G35   4. Dysphagia, oral phase 787.21 R13.11   5. Protein-calorie malnutrition, severe ([place]) 262 E43   6. Sacral decubitus ulcer, stage III ([facility]) 707.03 L89.153    707.23    7. Urinary retention 788.20 R33.9    due to paraplegia     Cont current meds as ordered. Finish doxy  PT/OT/ST as ordered  Wound care to follow  Nutritional supplements as ordered  F/u with neurology as scheduled  f/u with other specialists as scheduled  Foley cath care as indicated  Check CBC w diff, CMP  GOAL: short term rehab and d/c home when medically appropriate. Communicated with pt and nursing.  Will follow   Marquinn Meschke S. Perlie Gold  [facility] [address] [place], [place] [address] [phone] Cell (Monday-Friday 8 AM - 5 PM) [phone] After 5 PM and follow prompts

## 2016-03-19 LAB — CBC AND DIFFERENTIAL
HEMATOCRIT: 36 % (ref 36–46)
HEMOGLOBIN: 10.7 g/dL — AB (ref 12.0–16.0)
Neutrophils Absolute: 10 /uL
Platelets: 513 10*3/uL — AB (ref 150–399)
WBC: 12.3 10^3/mL

## 2016-03-19 LAB — BASIC METABOLIC PANEL
BUN: 14 mg/dL (ref 4–21)
CREATININE: 0.4 mg/dL — AB (ref 0.5–1.1)
Glucose: 112 mg/dL
POTASSIUM: 4.7 mmol/L (ref 3.4–5.3)
SODIUM: 149 mmol/L — AB (ref 137–147)

## 2016-03-19 LAB — HEPATIC FUNCTION PANEL
ALT: 64 U/L — AB (ref 7–35)
AST: 74 U/L — AB (ref 13–35)
Alkaline Phosphatase: 113 U/L (ref 25–125)
Bilirubin, Total: 0.3 mg/dL

## 2016-03-19 LAB — CHROMOSOME ANALYSIS, BONE MARROW

## 2016-03-23 DIAGNOSIS — L89152 Pressure ulcer of sacral region, stage 2: Secondary | ICD-10-CM | POA: Diagnosis not present

## 2016-03-23 DIAGNOSIS — L89153 Pressure ulcer of sacral region, stage 3: Secondary | ICD-10-CM | POA: Diagnosis not present

## 2016-03-25 NOTE — Progress Notes (Signed)
Received bone marrow bx results. Sent to scan

## 2016-03-29 ENCOUNTER — Encounter (HOSPITAL_COMMUNITY): Payer: Self-pay

## 2016-04-05 ENCOUNTER — Encounter (HOSPITAL_COMMUNITY): Payer: Self-pay | Admitting: Emergency Medicine

## 2016-04-05 ENCOUNTER — Inpatient Hospital Stay (HOSPITAL_COMMUNITY): Payer: Medicare Other

## 2016-04-05 ENCOUNTER — Inpatient Hospital Stay (HOSPITAL_COMMUNITY)
Admission: EM | Admit: 2016-04-05 | Discharge: 2016-04-08 | DRG: 689 | Disposition: A | Discharge status: Skilled Nursing Facility | Payer: Medicare Other | Attending: Internal Medicine | Admitting: Internal Medicine

## 2016-04-05 DIAGNOSIS — L89153 Pressure ulcer of sacral region, stage 3: Secondary | ICD-10-CM | POA: Diagnosis present

## 2016-04-05 DIAGNOSIS — E870 Hyperosmolality and hypernatremia: Secondary | ICD-10-CM | POA: Diagnosis not present

## 2016-04-05 DIAGNOSIS — E876 Hypokalemia: Secondary | ICD-10-CM | POA: Diagnosis present

## 2016-04-05 DIAGNOSIS — R509 Fever, unspecified: Secondary | ICD-10-CM

## 2016-04-05 DIAGNOSIS — R000 Tachycardia, unspecified: Secondary | ICD-10-CM

## 2016-04-05 DIAGNOSIS — R7989 Other specified abnormal findings of blood chemistry: Secondary | ICD-10-CM

## 2016-04-05 DIAGNOSIS — G35D Multiple sclerosis, unspecified: Secondary | ICD-10-CM | POA: Diagnosis present

## 2016-04-05 DIAGNOSIS — N926 Irregular menstruation, unspecified: Secondary | ICD-10-CM | POA: Diagnosis not present

## 2016-04-05 DIAGNOSIS — L89150 Pressure ulcer of sacral region, unstageable: Secondary | ICD-10-CM | POA: Diagnosis present

## 2016-04-05 DIAGNOSIS — Z8701 Personal history of pneumonia (recurrent): Secondary | ICD-10-CM

## 2016-04-05 DIAGNOSIS — R1900 Intra-abdominal and pelvic swelling, mass and lump, unspecified site: Secondary | ICD-10-CM

## 2016-04-05 DIAGNOSIS — N1330 Unspecified hydronephrosis: Secondary | ICD-10-CM | POA: Diagnosis not present

## 2016-04-05 DIAGNOSIS — D62 Acute posthemorrhagic anemia: Secondary | ICD-10-CM | POA: Diagnosis present

## 2016-04-05 DIAGNOSIS — N390 Urinary tract infection, site not specified: Secondary | ICD-10-CM | POA: Diagnosis not present

## 2016-04-05 DIAGNOSIS — IMO0002 Reserved for concepts with insufficient information to code with codable children: Secondary | ICD-10-CM | POA: Diagnosis present

## 2016-04-05 DIAGNOSIS — G35 Multiple sclerosis: Secondary | ICD-10-CM | POA: Diagnosis present

## 2016-04-05 DIAGNOSIS — D709 Neutropenia, unspecified: Secondary | ICD-10-CM | POA: Diagnosis present

## 2016-04-05 DIAGNOSIS — Z681 Body mass index (BMI) 19 or less, adult: Secondary | ICD-10-CM

## 2016-04-05 DIAGNOSIS — D509 Iron deficiency anemia, unspecified: Secondary | ICD-10-CM

## 2016-04-05 DIAGNOSIS — G8220 Paraplegia, unspecified: Secondary | ICD-10-CM | POA: Diagnosis present

## 2016-04-05 DIAGNOSIS — B9689 Other specified bacterial agents as the cause of diseases classified elsewhere: Secondary | ICD-10-CM | POA: Diagnosis present

## 2016-04-05 DIAGNOSIS — E43 Unspecified severe protein-calorie malnutrition: Secondary | ICD-10-CM | POA: Diagnosis present

## 2016-04-05 DIAGNOSIS — M60009 Infective myositis, unspecified site: Secondary | ICD-10-CM | POA: Diagnosis not present

## 2016-04-05 DIAGNOSIS — D638 Anemia in other chronic diseases classified elsewhere: Secondary | ICD-10-CM | POA: Insufficient documentation

## 2016-04-05 DIAGNOSIS — Z79899 Other long term (current) drug therapy: Secondary | ICD-10-CM

## 2016-04-05 DIAGNOSIS — A498 Other bacterial infections of unspecified site: Secondary | ICD-10-CM

## 2016-04-05 DIAGNOSIS — Z87891 Personal history of nicotine dependence: Secondary | ICD-10-CM

## 2016-04-05 DIAGNOSIS — Z818 Family history of other mental and behavioral disorders: Secondary | ICD-10-CM

## 2016-04-05 LAB — URINALYSIS, ROUTINE W REFLEX MICROSCOPIC
Bilirubin Urine: NEGATIVE
Glucose, UA: NEGATIVE mg/dL
Hgb urine dipstick: NEGATIVE
Ketones, ur: NEGATIVE mg/dL
Nitrite: POSITIVE — AB
PH: 5 (ref 5.0–8.0)
Protein, ur: 100 mg/dL — AB
Specific Gravity, Urine: 1.015 (ref 1.005–1.030)
Squamous Epithelial / HPF: NONE SEEN

## 2016-04-05 LAB — CBC WITH DIFFERENTIAL/PLATELET
BASOS PCT: 0 %
Basophils Absolute: 0 10*3/uL (ref 0.0–0.1)
Eosinophils Absolute: 0.3 10*3/uL (ref 0.0–0.7)
Eosinophils Relative: 2 %
HCT: 33 % — ABNORMAL LOW (ref 36.0–46.0)
HEMOGLOBIN: 10.3 g/dL — AB (ref 12.0–15.0)
LYMPHS PCT: 12 %
Lymphs Abs: 1.3 10*3/uL (ref 0.7–4.0)
MCH: 28 pg (ref 26.0–34.0)
MCHC: 31.2 g/dL (ref 30.0–36.0)
MCV: 89.7 fL (ref 78.0–100.0)
MONOS PCT: 3 %
Monocytes Absolute: 0.3 10*3/uL (ref 0.1–1.0)
NEUTROS ABS: 9 10*3/uL — AB (ref 1.7–7.7)
NEUTROS PCT: 83 %
PLATELETS: 632 10*3/uL — AB (ref 150–400)
RBC: 3.68 MIL/uL — ABNORMAL LOW (ref 3.87–5.11)
RDW: 15.1 % (ref 11.5–15.5)
WBC: 10.9 10*3/uL — ABNORMAL HIGH (ref 4.0–10.5)

## 2016-04-05 LAB — BASIC METABOLIC PANEL
ANION GAP: 10 (ref 5–15)
BUN: 10 mg/dL (ref 6–20)
CALCIUM: 9.9 mg/dL (ref 8.9–10.3)
CHLORIDE: 100 mmol/L — AB (ref 101–111)
CO2: 31 mmol/L (ref 22–32)
Creatinine, Ser: 0.58 mg/dL (ref 0.44–1.00)
GFR calc non Af Amer: >60 mL/min (ref >60)
Glucose, Bld: 92 mg/dL (ref 65–99)
POTASSIUM: 3.7 mmol/L (ref 3.5–5.1)
Sodium: 141 mmol/L (ref 135–145)

## 2016-04-05 LAB — I-STAT CG4 LACTIC ACID, ED: LACTIC ACID, VENOUS: 0.86 mmol/L (ref 0.5–1.9)

## 2016-04-05 LAB — MAGNESIUM: MAGNESIUM: 1.7 mg/dL (ref 1.7–2.4)

## 2016-04-05 MED ORDER — SODIUM CHLORIDE 0.9% FLUSH
3.0000 mL | Freq: Two times a day (BID) | INTRAVENOUS | Status: DC
  Administered 2016-04-05 – 2016-04-08 (×4): 3 mL via INTRAVENOUS

## 2016-04-05 MED ORDER — ONDANSETRON HCL 4 MG PO TABS: 4.0000 mg | ORAL_TABLET | Freq: Four times a day (QID) | ORAL | Status: DC | PRN

## 2016-04-05 MED ORDER — ONDANSETRON HCL 4 MG/2ML IJ SOLN: 4.0000 mg | Freq: Four times a day (QID) | INTRAMUSCULAR | Status: DC | PRN

## 2016-04-05 MED ORDER — ENOXAPARIN SODIUM 30 MG/0.3ML ~~LOC~~ SOLN
30.0000 mg | SUBCUTANEOUS | Status: DC
  Administered 2016-04-05 – 2016-04-07 (×3): 30 mg via SUBCUTANEOUS
  Filled 2016-04-05 (×4): qty 0.3

## 2016-04-05 MED ORDER — LEVALBUTEROL HCL 0.63 MG/3ML IN NEBU: 0.6300 mg | INHALATION_SOLUTION | Freq: Four times a day (QID) | RESPIRATORY_TRACT | Status: DC | PRN

## 2016-04-05 MED ORDER — SODIUM CHLORIDE 0.9 % IV SOLN
INTRAVENOUS | Status: DC
  Administered 2016-04-05 – 2016-04-07 (×4): via INTRAVENOUS

## 2016-04-05 MED ORDER — DEXTROSE 5 % IV SOLN: 2.0000 g | Freq: Once | INTRAVENOUS | Status: DC

## 2016-04-05 MED ORDER — CEFTRIAXONE SODIUM 1 G IJ SOLR
1.0000 g | Freq: Once | INTRAMUSCULAR | Status: AC
  Administered 2016-04-05: 1 g via INTRAVENOUS
  Filled 2016-04-05: qty 10

## 2016-04-05 MED ORDER — ACETAMINOPHEN 325 MG PO TABS
650.0000 mg | ORAL_TABLET | Freq: Once | ORAL | Status: AC
  Administered 2016-04-05: 650 mg via ORAL
  Filled 2016-04-05: qty 2

## 2016-04-05 MED ORDER — ACETAMINOPHEN 650 MG RE SUPP: 650.0000 mg | Freq: Four times a day (QID) | RECTAL | Status: DC | PRN

## 2016-04-05 MED ORDER — SODIUM CHLORIDE 0.9 % IV BOLUS (SEPSIS)
1000.0000 mL | Freq: Once | INTRAVENOUS | Status: AC
  Administered 2016-04-05: 1000 mL via INTRAVENOUS

## 2016-04-05 MED ORDER — DEXTROSE 5 % IV SOLN
2.0000 g | INTRAVENOUS | Status: DC
  Filled 2016-04-05: qty 2

## 2016-04-05 MED ORDER — ACETAMINOPHEN 325 MG PO TABS
650.0000 mg | ORAL_TABLET | Freq: Four times a day (QID) | ORAL | Status: DC | PRN
  Administered 2016-04-06 – 2016-04-08 (×3): 650 mg via ORAL
  Filled 2016-04-05 (×3): qty 2

## 2016-04-05 NOTE — ED Notes (Signed)
Pt transported to US

## 2016-04-05 NOTE — Consult Note (Signed)
[facility] Nurse wound consult note Reason for Consult:Pressure injury, moisture associated skin damage (MASD), specifically incontinence associated dermatitis (IAD). Known to our department.  Last seen by my partner, M. Austin  on 03/10/16 and prior to that by our [facility] on 03/03/16. Wound type: pressure, moisture Pressure Injury POA: Yes (Stage 3 to sacrum) Measurement:5.5cm x 2.2cm x 0.2cm.  80% red, 20% dark slough. Moist. Wound bed:As described above Drainage (amount, consistency, odor) difficult to determine as patient is extremely wet with urine at the time of my assessment. Periwound: Intact.  Several small areas of partial thickness tissue loss in the perineal area secondary to IAD, moist, pink and the largest measuring less than 0.5cm round x 0.1cm. Dressing procedure/placement/frequency: If patient is to be admitted, we will place on a mattress replacement with low air loss feature.  In the meantime, here in the ED we will turn and reposition frequently and management urinary incontinence with adult briefs. Nursing is provided with wound care guidance via the orders; please note:  Wounds were not the reason for transfer from the facility to the ED. [place] nursing team will not follow, but will remain available to this patient, the nursing and medical teams.  Please re-consult if needed. Thanks, Maudie Flakes, MSN, RN, [place], Arther Abbott  Pager# [phone]

## 2016-04-05 NOTE — ED Provider Notes (Signed)
[facility] DEPT Provider Note   CSN: [id number] Arrival date & time: 04/05/16  [id number]     History   Chief Complaint Chief Complaint  Patient presents with  . urethral pain    HPI Deanna Baldwin is a 41 y.o. female.  HPI  41 year old female with a history of MS presents from her living facility after she pulled out her Foley catheter. I discussed with the nurse at [facility] and she states that the patient's chronic indwelling Foley catheter came out yesterday in the morning. They called the doctor and due to concern for possible urethral injury they did not replace a Foley catheter and decided to send her today to be evaluated.  According to the nurse, the patient has not had a fever or been ill. She was discharged from the hospital last month for sepsis. The patient currently has no complaints. She states she takes Tylenol every morning and is requesting this although she denies pain.   Past Medical History:  Diagnosis Date  . Buttock wound 03/03/2016  . MS (multiple sclerosis) ([place])   . Pneumonia 02/2016  . Protein calorie malnutrition ([place])   . Severe sepsis ([place]) 03/03/2016  . UTI (urinary tract infection) 02/2016    Patient Active Problem List   Diagnosis Date Noted  . Dysphagia, oral phase 03/17/2016  . Neutropenia ([place])   . Other neutropenia ([place])   . Muscle abscess   . Myofascitis   . Sepsis secondary to UTI ([place])   . Aspiration pneumonia of left lower lobe due to vomit ([place])   . Sacral decubitus ulcer, stage III ([place])   . Multiple sclerosis ([place])   . Protein-calorie malnutrition, severe ([name])   . Hypokalemia 03/05/2016  . Hypernatremia   . Pressure injury of skin 03/03/2016  . 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 Severe sepsis ([place]) 03/02/2016  . Community acquired pneumonia 03/02/2016  . UTI (urinary tract infection) 03/02/2016  . Abnormal LFTs 03/02/2016  . Buttock wound 03/02/2016    Past Surgical History:  Procedure  Laterality Date  . NO PAST SURGERIES      OB History    No data available       Home Medications    Prior to Admission medications   Medication Sig Start Date End Date Taking? Authorizing Provider  acetaminophen (TYLENOL) 325 MG tablet Take 2 tablets (650 mg total) by mouth every 6 (six) hours as needed for mild pain (or Fever >/= 101). 03/15/16   Florencia Reasons, MD  bisacodyl (DULCOLAX) 5 MG EC tablet Take 1 tablet (5 mg total) by mouth daily as needed for moderate constipation. 03/15/16   Florencia Reasons, MD  feeding supplement, ENSURE ENLIVE, (ENSURE ENLIVE) LIQD Take 237 mLs by mouth 2 (two) times daily between meals. 03/15/16   Florencia Reasons, MD  Maltodextrin-Xanthan Gum (Judsonia) POWD For honey thick liquid 03/15/16   Florencia Reasons, MD    Family History Family History  Problem Relation Age of Onset  . Mental illness Sister     Social History Social History  Substance Use Topics  . Smoking status: Former Smoker    Packs/day: 1.00    Types: Cigarettes    Quit date: 02/01/2010  . Smokeless tobacco: Never Used  . Alcohol use No     Allergies   Patient has no known allergies.   Review of Systems Review of Systems  Constitutional: Negative for fever.  Respiratory: Negative for cough.   Gastrointestinal: Negative for vomiting.  All other systems reviewed and are negative.    Physical Exam Updated Vital Signs BP 105/70 (BP Location: Left Arm)   Pulse 120   Temp 99 F (37.2 C) (Oral)   Resp 20   SpO2 95% Comment: Simultaneous filing. User may not have seen previous data.  Physical Exam  Constitutional: She appears cachectic. No distress.  HENT:  Head: Normocephalic and atraumatic.  Right Ear: External ear normal.  Left Ear: External ear normal.  Nose: Nose normal.  Dry lips/oropharynx  Eyes: Right eye exhibits no discharge. Left eye exhibits no discharge.  Cardiovascular: Regular rhythm and normal heart sounds.  Tachycardia present.   Pulmonary/Chest: Effort normal  and breath sounds normal.  Abdominal: Soft. She exhibits no distension. There is no tenderness.  Genitourinary:    No bleeding in the vagina.  Musculoskeletal:  Lower extremity contractures  Neurological: She is alert. She is disoriented.  Skin: Skin is warm and dry. She is not diaphoretic.  Nursing note and vitals reviewed.    ED Treatments / Results  Labs (all labs ordered are listed, but only abnormal results are displayed) Labs Reviewed  BASIC METABOLIC PANEL - Abnormal; Notable for the following:       Result Value   Chloride 100 (*)    All other components within normal limits  CBC WITH DIFFERENTIAL/PLATELET - Abnormal; Notable for the following:    WBC 10.9 (*)    RBC 3.68 (*)    Hemoglobin 10.3 (*)    HCT 33.0 (*)    Platelets 632 (*)    Neutro Abs 9.0 (*)    All other components within normal limits  URINALYSIS, ROUTINE W REFLEX MICROSCOPIC - Abnormal; Notable for the following:    APPearance TURBID (*)    Protein, ur 100 (*)    Nitrite POSITIVE (*)    Leukocytes, UA MODERATE (*)    Bacteria, UA MANY (*)    All other components within normal limits  URINE CULTURE  CULTURE, BLOOD (ROUTINE X 2)  CULTURE, BLOOD (ROUTINE X 2)  URINE CULTURE  MAGNESIUM  I-STAT CG4 LACTIC ACID, ED    EKG  EKG Interpretation None       Radiology No results found.  Procedures Procedures (including critical care time)  Medications Ordered in ED Medications - No data to display   Initial Impression / Assessment and Plan / ED Course  I have reviewed the triage vital signs and the nursing notes.  Pertinent labs & imaging results that were available during my care of the patient were reviewed by me and considered in my medical decision making (see chart for details).     Patient appears acute on chronically ill. Has tachycardia, low grade fever, and soft BP in 90s. Given IV fluids. Unclear what is on her urethra/vagina, but this does not appear like an acute injury,  possibly is a mass. Will treat urine as UTI. Foley placed. Low amount of output, and given she appears dry despite her stable Cr, will give fluids and admit for further workup and care.   Final Clinical Impressions(s) / ED Diagnoses   Final diagnoses:  Acute UTI    New Prescriptions New Prescriptions   No medications on file     Sherwood Gambler, MD 04/05/16 1500

## 2016-04-05 NOTE — Clinical Social Work Note (Signed)
Clinical Social Work Assessment  Patient Details  Name: Deanna Baldwin MRN: [id number] Date of Birth: [birth year]  Date of referral:  04/05/16               Reason for consult:  Facility Placement, Discharge Planning                Permission sought to share information with:  Facility Art therapist granted to share information::  Yes, Verbal Permission Granted  Name::        Agency::     Relationship::     Contact Information:     Housing/Transportation Living arrangements for the past 2 months:  [place] of Information:  Patient Patient Interpreter Needed:  None Criminal Activity/Legal Involvement Pertinent to Current Situation/Hospitalization:    Significant Relationships:  Other Family Members ([place] ) Lives with:  Facility Resident Do you feel safe going back to the place where you live?  Yes Need for family participation in patient care:  Yes (Comment)  Care giving concerns:  Patient was recently admitted to [facility] for short term rehab.    Social Worker assessment / plan:  CSW spoke with patient regarding plans for discharge. Patient was pleasant during conversation and stated "I would love to go back to my apartment with my son but know finishing physical therapy will be best". Patient stated her son, [name], is 30 years old and is currently stating with patient aunt Vaughan Basta. Patient requested CSW to contact son/ aunt to update them and bring her belongings. Patient stated she did not have any concerns at this time and plans to return to [place] once stable for discharge. CSW placed call to patients aunt linda- waiting for return call.  Plan: CSW will complete PARSS/ FL2 and update [facility] closer to patients discharge.   Employment status:  Disabled (Comment on whether or not currently receiving Disability) Insurance information:  Medicare PT Recommendations:  Not assessed at this  time Information / Referral to community resources:     Patient/Family's Response to care:  Patient appreciated CSW.   Patient/Family's Understanding of and Emotional Response to Diagnosis, Current Treatment, and Prognosis:  Patient understands current prognosis and treatment.   Emotional Assessment Appearance:  Appears older than stated age Attitude/Demeanor/Rapport:    Affect (typically observed):  Accepting, Calm, Pleasant Orientation:  Oriented to Self, Oriented to Place, Oriented to  Time, Oriented to Situation Alcohol / Substance use:    Psych involvement (Current and /or in the community):  No (Comment)  Discharge Needs  Concerns to be addressed:  No discharge needs identified Readmission within the last 30 days:  Yes Current discharge risk:  None Barriers to Discharge:  No Barriers Identified   Weston Anna, LCSW 04/05/2016, 2:32 PM

## 2016-04-05 NOTE — H&P (Signed)
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  bladder scan did not show any retained urine. According to the nurse, the patient has not had a fever or been ill. Patient is unable to provide any meaningful history despite being awake and conversant.Very difficult to get history from the patient. She was recently admitted and discharged on 03/15/16, when she was admitted for an upper respiratory illness, as well as sepsis secondary to UTI, right gluteal pyomyositis as well as aspiration pneumonia. Patient was also found to have stage II to 3 sacral decubitus ulcers on admission. She had hematology oncology consultation by Dr.  Lindi Adie , for profound neutropenia and underwent bone marrow biopsy on 2/9.  She was empirically treated with vancomycin and Zosyn, subsequently discharged on doxycycline for pyomyositis. ED course  105/70 (BP Location: Left Arm)   Pulse 120   Temp 99 F (37.2 C) (Oral)   Resp 20   SpO2 95% Foley catheter was replaced by the ER. Patient was noted to have  some skin irritation around her vulvar but no leading, no traumatic findings, no laceration. UA positive. Patient admitted for treatment of a urinary tract infection    Review of Systems: negative for the following   Unable to obtain review of systems   Past Medical History:  Diagnosis Date  . Buttock wound 03/03/2016  . MS (multiple sclerosis) ([place])   . Pneumonia 02/2016  . Protein calorie malnutrition ([place])   . Severe sepsis ([place]) 03/03/2016  . UTI (urinary tract infection)  02/2016     Past Surgical History:  Procedure Laterality Date  . NO PAST SURGERIES        Social History:  reports that she quit smoking about 6 years ago. Her smoking use included Cigarettes. She smoked 1.00 pack per day. She has never used smokeless tobacco. She reports that she does not drink alcohol or use drugs.    No Known Allergies  Family History  Problem Relation Age of Onset  . Mental illness Sister          Prior to Admission medications   Medication Sig Start Date End Date Taking? Authorizing Provider  acetaminophen (TYLENOL) 325 MG tablet Take 2 tablets (650 mg total) by mouth every 6 (six) hours as needed for mild pain (or Fever >/= 101). 03/15/16  Yes Florencia Reasons, MD  Amino Acids-Protein Hydrolys (FEEDING SUPPLEMENT, PRO-STAT SUGAR FREE 64,) LIQD Take 30 mLs by mouth 3 (three) times daily with meals.   Yes Historical Provider, MD  bisacodyl (DULCOLAX) 5 MG EC tablet Take 1 tablet (5 mg total) by mouth daily as needed for moderate constipation. 03/15/16  Yes Florencia Reasons, MD  feeding supplement, ENSURE ENLIVE, (ENSURE ENLIVE) LIQD Take 237 mLs by mouth 2 (two) times daily between meals. 03/15/16  Yes Florencia Reasons, MD  Maltodextrin-Xanthan Gum (Bantam) POWD For honey thick liquid 03/15/16  Yes Florencia Reasons, MD  Multiple Vitamin (MULTIVITAMIN WITH MINERALS) TABS tablet Take 1 tablet by mouth daily.   Yes Historical Provider, MD  polyethylene glycol (MIRALAX / GLYCOLAX) packet Take 17 g by mouth daily.   Yes Historical  Provider, MD     Physical Exam: Vitals:   04/05/16 0840 04/05/16 0845 04/05/16 0911 04/05/16 1146  BP: 105/70   97/63  Pulse: 120   109  Resp:  20  20  Temp: 99 F (37.2 C)  100.1 F (37.8 C)   TempSrc: Oral  Rectal   SpO2: 95%   98%        Vitals:   04/05/16 0840 04/05/16 0845 04/05/16 0911 04/05/16 1146  BP: 105/70   97/63  Pulse: 120   109  Resp:  20  20  Temp: 99 F (37.2 C)  100.1 F (37.8 C)   TempSrc: Oral  Rectal   SpO2: 95%    98%   Constitutional: She is oriented to person, place, and time. She appears cachectic. No distress.  Frail appearing in NAD, sitting up in bed  HENT:  Mouth/Throat: Oropharynx is clear and moist. No oropharyngeal exudate.  Eyes: Pupils are equal, round, and reactive to light. No scleral icterus.  Neck: Neck supple. Carotid bruit is not present. No tracheal deviation present. No thyromegaly present.  Cardiovascular: Regular rhythm, normal heart sounds and intact distal pulses.  Tachycardia present.  Exam reveals no gallop and no friction rub.   No murmur heard. No LE edema b/l. no calf TTP.   Pulmonary/Chest: Effort normal. No stridor. No respiratory distress. She has no wheezes. She has rales (left base).  Abdominal: Soft. Bowel sounds are normal. She exhibits no distension and no mass. There is no hepatomegaly. There is no tenderness. There is no rebound and no guarding.  Genitourinary:  Genitourinary Comments: Foley cath intact and DTG clear yellow urine  Musculoskeletal: She exhibits edema (left knee immobilizer intact). She exhibits no tenderness.  Lymphadenopathy:    She has no cervical adenopathy.  Neurological: She is alert and oriented to person, place, and time. She displays atrophy. She exhibits abnormal muscle tone.  Spastic quadriplegia  Skin: Skin is warm and dry. No rash noted.  Psychiatric: She has a normal mood and affect. Her behavior is normal. Judgment and thought content normal.     Labs on Admission: I have personally reviewed following labs and imaging studies  CBC:  Recent Labs Lab 04/05/16 1002  WBC 10.9*  NEUTROABS 9.0*  HGB 10.3*  HCT 33.0*  MCV 89.7  PLT 632*    Basic Metabolic Panel:  Recent Labs Lab 04/05/16 1002  NA 141  K 3.7  CL 100*  CO2 31  GLUCOSE 92  BUN 10  CREATININE 0.58  CALCIUM 9.9  MG 1.7    GFR: CrCl cannot be calculated (Unknown ideal weight.).  Liver Function Tests: No results for input(s): AST, ALT, ALKPHOS,  BILITOT, PROT, ALBUMIN in the last 168 hours. No results for input(s): LIPASE, AMYLASE in the last 168 hours. No results for input(s): AMMONIA in the last 168 hours.  Coagulation Profile: No results for input(s): INR, PROTIME in the last 168 hours. No results for input(s): DDIMER in the last 72 hours.  Cardiac Enzymes: No results for input(s): CKTOTAL, CKMB, CKMBINDEX, TROPONINI in the last 168 hours.  BNP (last 3 results) No results for input(s): PROBNP in the last 8760 hours.  HbA1C: No results for input(s): HGBA1C in the last 72 hours. No results found for: HGBA1C   CBG: No results for input(s): GLUCAP in the last 168 hours.  Lipid Profile: No results for input(s): CHOL, HDL, LDLCALC, TRIG, CHOLHDL, LDLDIRECT in the last 72 hours.  Thyroid Function Tests: No results  for input(s): TSH, T4TOTAL, FREET4, T3FREE, THYROIDAB in the last 72 hours.  Anemia Panel: No results for input(s): VITAMINB12, FOLATE, FERRITIN, TIBC, IRON, RETICCTPCT in the last 72 hours.  Urine analysis:    Component Value Date/Time   COLORURINE YELLOW 04/05/2016 1056   APPEARANCEUR TURBID (A) 04/05/2016 1056   LABSPEC 1.015 04/05/2016 1056   PHURINE 5.0 04/05/2016 1056   GLUCOSEU NEGATIVE 04/05/2016 1056   HGBUR NEGATIVE 04/05/2016 1056   [place] 04/05/2016 1056   KETONESUR NEGATIVE 04/05/2016 1056   PROTEINUR 100 (A) 04/05/2016 1056   UROBILINOGEN 0.2 06/25/2008 1551   NITRITE POSITIVE (A) 04/05/2016 1056   LEUKOCYTESUR MODERATE (A) 04/05/2016 1056    Sepsis Labs: '@LABRCNTIP' (procalcitonin:4,lacticidven:4) )No results found for this or any previous visit (from the past 240 hour(s)).       Radiological Exams on Admission: No results found. Mr Lumbar Spine W Wo Contrast  Result Date: 03/07/2016 CLINICAL DATA:  Lumbar spine necrotic wound. Multiple sclerosis with spastic paraplegia. EXAM: MRI LUMBAR SPINE WITHOUT AND WITH CONTRAST TECHNIQUE: Multiplanar and multiecho pulse  sequences of the lumbar spine were obtained without and with intravenous contrast. CONTRAST:  80m MULTIHANCE GADOBENATE DIMEGLUMINE 529 MG/ML IV SOLN COMPARISON:  Body CT 06/25/2008 FINDINGS: Segmentation: Transitional lumbosacral vertebra with rudimentary S1-2 interspace, based on the lowest visible ribs. Alignment:  Normal Vertebrae:  No fracture, evidence of discitis, or bone lesion. Conus medullaris: Extends to the L2 level and appears normal. Thin fat deposition in the filum terminale, incidental. Paraspinal and other soft tissues: Marked over distention of the urinary bladder with bilateral hydroureteronephrosis. The same appearance was present on 2010 abdominal CT. Nonspecific subcutaneous fat reticulation throughout the lumbar back. Negative for abscess or muscular edema. Disc levels: No degenerative changes or impingement. IMPRESSION: 1. Negative for lumbar spine infection or impingement. 2. There is non organized edema in the subcutaneous fat. Negative for abscess. 3. Urinary retention causing bilateral hydroureteronephrosis, also seen by CT in 2010. Electronically Signed   By: JMonte FantasiaM.D.   On: 03/07/2016 17:10   Mr Pelvis W Wo Contrast  Result Date: 03/07/2016 CLINICAL DATA:  Decubitus ulcers. EXAM: MRI PELVIS WITHOUT AND WITH CONTRAST TECHNIQUE: Multiplanar multisequence MR imaging of the pelvis was performed both before and after administration of intravenous contrast. CONTRAST:  840mMULTIHANCE GADOBENATE DIMEGLUMINE 529 MG/ML IV SOLN COMPARISON:  CT scan 06/25/2008. FINDINGS: Urinary Tract: Diffuse subcutaneous soft tissue swelling/ edema/ fluid involving the buttock areas and sacrum bilaterally, likely cellulitis. Shallow sacral decubitus ulcer is noted but no findings for underlying sacral osteomyelitis. No obvious ulcers over the hip or buttock areas. There is diffuse myositis involving the gluteus maximus muscle on the right and also the adductor muscles bilaterally. Small focal fluid  collections in the lateral aspects of the gluteus maximus muscles bilaterally with rim enhancement suspicious for pyomyositis. The largest area is on the right side near the greater trochanter and measures 2 cm. No definite MR findings for septic arthritis or osteomyelitis. There is massive distention of the bladder well up into the abdomen to the level of the umbilicus. No pelvic adenopathy or free pelvic fluid collections. Borderline enlarged inguinal lymph nodes bilaterally. Bilateral lower extremity cellulitis and myositis. IMPRESSION: 1. Cellulitis and diffuse myofasciitis involving the pelvic and hip musculature. There also or rim enhancing fluid collections in the gluteus maximus muscles bilaterally concerning for pyomyositis. 2. No findings to suggest septic arthritis or osteomyelitis. 3. Shallow sacral decubitus ulcer but no underlying sacral osteomyelitis. 4. Markedly distended bladder.  Electronically Signed   By: Marijo Sanes M.D.   On: 03/07/2016 17:15   Ct Biopsy  Result Date: 03/12/2016 INDICATION: Leukopenia of uncertain etiology. Please perform CT-guided bone marrow biopsy for tissue diagnostic purposes. EXAM: CT-GUIDED BONE MARROW BIOPSY AND ASPIRATION MEDICATIONS: None ANESTHESIA/SEDATION: Fentanyl 50 mcg IV; Versed 1 mg IV Sedation Time: 11 minutes; The patient was continuously monitored during the procedure by the interventional radiology nurse under my direct supervision. COMPLICATIONS: None immediate. PROCEDURE: Informed consent was obtained from the patient following an explanation of the procedure, risks, benefits and alternatives. The patient understands, agrees and consents for the procedure. All questions were addressed. A time out was performed prior to the initiation of the procedure. The patient was positioned prone and non-contrast localization CT was performed of the pelvis to demonstrate the iliac marrow spaces. The operative site was prepped and draped in the usual sterile  fashion. Under sterile conditions and local anesthesia, a 22 gauge spinal needle was utilized for procedural planning. Next, an 11 gauge coaxial bone biopsy needle was advanced into the left iliac marrow space. Needle position was confirmed with CT imaging. Initially, bone marrow aspiration was performed. Next, a bone marrow biopsy was obtained with the 11 gauge outer bone marrow device. The 11 gauge coaxial bone biopsy needle was re-advanced into a slightly different location within the left iliac marrow space, positioning was confirmed and an additional bone marrow biopsy was obtained. Samples were prepared with the cytotechnologist and deemed adequate. No, additional sample was set aside for Gram stain and culture analysis. The needle was removed intact. Hemostasis was obtained with compression and a dressing was placed. The patient tolerated the procedure well without immediate post procedural complication. IMPRESSION: Successful CT guided left iliac bone marrow aspiration and core biopsy. Note, an additional sample was set aside for Gram stain and culture analysis. Electronically Signed   By: Sandi Mariscal M.D.   On: 03/12/2016 09:49   Dg Chest Port 1 View  Result Date: 03/07/2016 CLINICAL DATA:  Pneumonia. EXAM: PORTABLE CHEST 1 VIEW COMPARISON:  Radiograph of March 02, 2016. FINDINGS: Stable cardiomediastinal silhouette. Right lung is clear. Increased left perihilar and basilar opacity is noted concerning for worsening pneumonia or atelectasis with associated pleural effusion. No pneumothorax is noted. Bony thorax is unremarkable. IMPRESSION: Increase left lung opacity concerning for worsening pneumonia or atelectasis with associated pleural effusion. Electronically Signed   By: Marijo Conception, M.D.   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 Patient started on Rocephin. Will reorder urine culture. Given fever, patient could potentially have pyelonephritis. Pelvic and renal ultrasound has been ordered to rule out nephrolithiasis, any underlying anatomical issues preventing catheterization.  Recent Pneumonia bilateral, possible aspiration, present on admission. Aspiration precautions, continue dysphagia 3, mechanical soft with honey thick liquids based on speech therapy recommendations. Titrate oxygen to keep saturation greater than 92%, aspiration precautions  Right gluteal pyomyositis; complete a course of doxycycline  History of Pressure ulcers, sacral,   stage 2 to 3, multiple. Wound care consultation will be obtained  Multiple sclerosis.  She has been getting ritaximab , last dose in 01/2016,given every six months at [place], resolved.  Hematology/oncology  Dr Lindi Adie consulted 03/12/16, s/p bone marrow biopsy on 2/9,   Protein calorie malnutrition. BMI 16. Very deconditioned, palliative care team consulted , ensure bid and magic cup tid.   Hypernatremia. Continue hydration with d5LR  at 75 cc per hour, for maintenance fluids. Follow renal panel in am, avoid hypotension or nephrotoxic medications.  Hypomagnesemia:  replace mag      DVT prophylaxis:  lovenox     Code Status Orders full code         consults called:  Family Communication: Admission, patients condition and plan of care including tests being ordered have been discussed with the patient  who indicates understanding and agree with the plan and Code Status  Admission status: obeservation  Disposition plan: Further plan will depend as patient's clinical course evolves and further radiologic and laboratory data become available. Likely home when stable     [facility] MD [facility] Pager [phone]  If 7PM-7AM, please contact night-coverage [email or web address] Password [email or web address]  04/05/2016, 2:04 PM

## 2016-04-05 NOTE — ED Triage Notes (Signed)
Per EMS. Pt from [facility]. Staff reports pt had a traumatic urinary catheterization last night. Pt denies any pain and reports she does not use a catheter normally. Pt also requesting tylenol.

## 2016-04-05 NOTE — ED Notes (Signed)
Bed: WA21 Expected date:  Expected time:  Means of arrival:  Comments: EMS  

## 2016-04-05 NOTE — ED Notes (Signed)
Scanned bladder 4x. No retained urine. Pt bladder not distended

## 2016-04-06 ENCOUNTER — Observation Stay (HOSPITAL_COMMUNITY): Payer: Medicare Other

## 2016-04-06 DIAGNOSIS — Z8701 Personal history of pneumonia (recurrent): Secondary | ICD-10-CM | POA: Diagnosis not present

## 2016-04-06 DIAGNOSIS — J690 Pneumonitis due to inhalation of food and vomit: Secondary | ICD-10-CM | POA: Diagnosis not present

## 2016-04-06 DIAGNOSIS — Z87891 Personal history of nicotine dependence: Secondary | ICD-10-CM | POA: Diagnosis not present

## 2016-04-06 DIAGNOSIS — M791 Myalgia: Secondary | ICD-10-CM | POA: Diagnosis not present

## 2016-04-06 DIAGNOSIS — Z681 Body mass index (BMI) 19 or less, adult: Secondary | ICD-10-CM | POA: Diagnosis not present

## 2016-04-06 DIAGNOSIS — N10 Acute tubulo-interstitial nephritis: Secondary | ICD-10-CM | POA: Diagnosis not present

## 2016-04-06 DIAGNOSIS — R945 Abnormal results of liver function studies: Secondary | ICD-10-CM | POA: Diagnosis not present

## 2016-04-06 DIAGNOSIS — L89009 Pressure ulcer of unspecified elbow, unspecified stage: Secondary | ICD-10-CM | POA: Diagnosis not present

## 2016-04-06 DIAGNOSIS — R7989 Other specified abnormal findings of blood chemistry: Secondary | ICD-10-CM | POA: Diagnosis present

## 2016-04-06 DIAGNOSIS — D638 Anemia in other chronic diseases classified elsewhere: Secondary | ICD-10-CM | POA: Insufficient documentation

## 2016-04-06 DIAGNOSIS — Z818 Family history of other mental and behavioral disorders: Secondary | ICD-10-CM | POA: Diagnosis not present

## 2016-04-06 DIAGNOSIS — E870 Hyperosmolality and hypernatremia: Secondary | ICD-10-CM | POA: Diagnosis present

## 2016-04-06 DIAGNOSIS — G35 Multiple sclerosis: Secondary | ICD-10-CM | POA: Diagnosis not present

## 2016-04-06 DIAGNOSIS — B9689 Other specified bacterial agents as the cause of diseases classified elsewhere: Secondary | ICD-10-CM | POA: Diagnosis present

## 2016-04-06 DIAGNOSIS — S31809A Unspecified open wound of unspecified buttock, initial encounter: Secondary | ICD-10-CM | POA: Diagnosis not present

## 2016-04-06 DIAGNOSIS — R1312 Dysphagia, oropharyngeal phase: Secondary | ICD-10-CM | POA: Diagnosis not present

## 2016-04-06 DIAGNOSIS — D509 Iron deficiency anemia, unspecified: Secondary | ICD-10-CM | POA: Diagnosis present

## 2016-04-06 DIAGNOSIS — N319 Neuromuscular dysfunction of bladder, unspecified: Secondary | ICD-10-CM | POA: Diagnosis not present

## 2016-04-06 DIAGNOSIS — N390 Urinary tract infection, site not specified: Secondary | ICD-10-CM | POA: Diagnosis not present

## 2016-04-06 DIAGNOSIS — G8220 Paraplegia, unspecified: Secondary | ICD-10-CM | POA: Diagnosis not present

## 2016-04-06 DIAGNOSIS — E876 Hypokalemia: Secondary | ICD-10-CM | POA: Diagnosis present

## 2016-04-06 DIAGNOSIS — D62 Acute posthemorrhagic anemia: Secondary | ICD-10-CM | POA: Diagnosis not present

## 2016-04-06 DIAGNOSIS — L89153 Pressure ulcer of sacral region, stage 3: Secondary | ICD-10-CM | POA: Diagnosis not present

## 2016-04-06 DIAGNOSIS — G114 Hereditary spastic paraplegia: Secondary | ICD-10-CM | POA: Diagnosis not present

## 2016-04-06 DIAGNOSIS — K6812 Psoas muscle abscess: Secondary | ICD-10-CM | POA: Diagnosis not present

## 2016-04-06 DIAGNOSIS — R509 Fever, unspecified: Secondary | ICD-10-CM | POA: Diagnosis not present

## 2016-04-06 DIAGNOSIS — E43 Unspecified severe protein-calorie malnutrition: Secondary | ICD-10-CM | POA: Diagnosis not present

## 2016-04-06 DIAGNOSIS — R6520 Severe sepsis without septic shock: Secondary | ICD-10-CM | POA: Diagnosis not present

## 2016-04-06 DIAGNOSIS — M60009 Infective myositis, unspecified site: Secondary | ICD-10-CM | POA: Diagnosis present

## 2016-04-06 DIAGNOSIS — R000 Tachycardia, unspecified: Secondary | ICD-10-CM | POA: Diagnosis not present

## 2016-04-06 DIAGNOSIS — Z79899 Other long term (current) drug therapy: Secondary | ICD-10-CM | POA: Diagnosis not present

## 2016-04-06 DIAGNOSIS — D709 Neutropenia, unspecified: Secondary | ICD-10-CM | POA: Diagnosis present

## 2016-04-06 DIAGNOSIS — J189 Pneumonia, unspecified organism: Secondary | ICD-10-CM | POA: Diagnosis not present

## 2016-04-06 LAB — CBC
HCT: 21.6 % — ABNORMAL LOW (ref 36.0–46.0)
HCT: 26.7 % — ABNORMAL LOW (ref 36.0–46.0)
HEMOGLOBIN: 8.2 g/dL — AB (ref 12.0–15.0)
Hemoglobin: 6.7 g/dL — CL (ref 12.0–15.0)
MCH: 27.9 pg (ref 26.0–34.0)
MCH: 28 pg (ref 26.0–34.0)
MCHC: 31 g/dL (ref 30.0–36.0)
MCHC: 30.7 g/dL (ref 30.0–36.0)
MCV: 90 fL (ref 78.0–100.0)
MCV: 91.1 fL (ref 78.0–100.0)
PLATELETS: 514 10*3/uL — AB (ref 150–400)
PLATELETS: 532 10*3/uL — AB (ref 150–400)
RBC: 2.4 MIL/uL — AB (ref 3.87–5.11)
RBC: 2.93 MIL/uL — AB (ref 3.87–5.11)
RDW: 15.5 % (ref 11.5–15.5)
RDW: 15.7 % — ABNORMAL HIGH (ref 11.5–15.5)
WBC: 8.4 10*3/uL (ref 4.0–10.5)
WBC: 9 10*3/uL (ref 4.0–10.5)

## 2016-04-06 LAB — COMPREHENSIVE METABOLIC PANEL
ALK PHOS: 75 U/L (ref 38–126)
ALT: 55 U/L — AB (ref 14–54)
AST: 27 U/L (ref 15–41)
Albumin: 2.1 g/dL — ABNORMAL LOW (ref 3.5–5.0)
Anion gap: 6 (ref 5–15)
BUN: 9 mg/dL (ref 6–20)
CHLORIDE: 112 mmol/L — AB (ref 101–111)
CO2: 25 mmol/L (ref 22–32)
CREATININE: 0.4 mg/dL — AB (ref 0.44–1.00)
Calcium: 8.6 mg/dL — ABNORMAL LOW (ref 8.9–10.3)
GFR calc non Af Amer: >60 mL/min (ref >60)
Glucose, Bld: 89 mg/dL (ref 65–99)
Potassium: 3.2 mmol/L — ABNORMAL LOW (ref 3.5–5.1)
Sodium: 143 mmol/L (ref 135–145)
Total Bilirubin: 0.5 mg/dL (ref 0.3–1.2)
Total Protein: 5.4 g/dL — ABNORMAL LOW (ref 6.5–8.1)

## 2016-04-06 LAB — MRSA PCR SCREENING: MRSA BY PCR: NEGATIVE

## 2016-04-06 MED ORDER — DEXTROSE 5 % IV SOLN
1.0000 g | INTRAVENOUS | Status: DC
  Administered 2016-04-06 – 2016-04-08 (×3): 1 g via INTRAVENOUS
  Filled 2016-04-06 (×3): qty 10

## 2016-04-06 MED ORDER — POTASSIUM CHLORIDE CRYS ER 20 MEQ PO TBCR
40.0000 meq | EXTENDED_RELEASE_TABLET | Freq: Two times a day (BID) | ORAL | Status: AC
  Administered 2016-04-06 (×2): 40 meq via ORAL
  Filled 2016-04-06 (×2): qty 2

## 2016-04-06 MED ORDER — SODIUM CHLORIDE 0.9 % IV SOLN
1.5000 g | Freq: Four times a day (QID) | INTRAVENOUS | Status: DC
  Filled 2016-04-06: qty 1.5

## 2016-04-06 NOTE — Progress Notes (Addendum)
[facility] PROGRESS NOTE    Progress Note  Deanna Baldwin  TSV:[id number] DOB: July 26, 1975 DOA: 04/05/2016 PCP: [name], MD     Brief Narrative:   Deanna Baldwin is an 41 y.o. 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 Urine culture showed many bacteria with to many to count, blood cells and red cells. Question infection versus traumatic injury. She is not hypoxic chest done today showed done infectious foci.  History right gluteal pyomyositis: She recently completed a course of Doxy. Wound care was consulted who recommended local care measures feature frequent repositioning. And daily dressing changes.  History of stage II to 3 sacral decubitus ulcer: Wound care following.  Multiple sclerosis: Continue current regimen.  Neutropenia: She status post a bone marrow biopsy in 03/12/2016 to follow-up with oncologist an outpatient.  Severe protein caloric malnutrition: BMI 16 we are consulted care, continue ensure drinks.  Normocytic anemia: On admission her hemoglobin was 10.3, this morning it is 6.7. There are no signs of overt bleeding or melena no hematemesis I blood per rectum no coffee-ground emesis. I will repeat a CBC today. To confirm abnormal lab. Urine looks clean, although to many to count RBC in urine.  Thrombocytosis: Likely reactive on admission 632, today is 514. Continue to monitor.  Hypokalemia: Replete oally, recheck in am.  DVT prophylaxis: lovenox Family Communication:none Disposition Plan/Barrier to D/C: home in 3 day Code Status:     Code Status Orders         Start     Ordered   04/05/16 1256  Full code  Continuous     04/05/16 1256    Code Status History    Date Active Date Inactive Code Status Order ID Comments User Context   03/02/2016 10:11 PM 03/15/2016  5:52 PM Full Code [id number]  Vianne Bulls, MD ED        IV Access:    Peripheral IV   Procedures and diagnostic studies:   US Transvaginal Non-ob  Result Date: 04/05/2016 CLINICAL DATA:  Irregular menstrual. EXAM: TRANSABDOMINAL AND TRANSVAGINAL ULTRASOUND OF PELVIS TECHNIQUE: Both transabdominal and transvaginal ultrasound examinations of the pelvis were performed. Transabdominal technique was performed for global imaging of the pelvis including uterus, ovaries, adnexal regions, and pelvic cul-de-sac. It was necessary to proceed with endovaginal exam following the transabdominal exam to visualize the uterus and ovaries and endometrium. COMPARISON:  MRI pelvis 03/07/2016 FINDINGS: Uterus Measurements: 7.9 x 3.4 x 3.8 cm. No fibroids or other mass visualized. Endometrium Thickness: 2.9 mm.  No focal abnormality visualized. Right ovary Measurements: 2.2 x 1.4 x 1.6 cm. Normal appearance/no adnexal mass. Left ovary Not visualized Other findings No abnormal free fluid.  Foley catheter is present in the bladder IMPRESSION: 1. Nonvisualization of the left ovary 2. Otherwise negative pelvic ultrasound Electronically Signed   By: Donavan Foil M.D.   On: 04/05/2016 14:44   US Pelvis Complete  Result Date: 04/05/2016 CLINICAL DATA:  Irregular menstrual. EXAM: TRANSABDOMINAL AND TRANSVAGINAL ULTRASOUND OF PELVIS TECHNIQUE: Both transabdominal and transvaginal ultrasound examinations of the pelvis were performed. Transabdominal technique was performed for global imaging of the pelvis including uterus, ovaries, adnexal regions, and pelvic cul-de-sac. It was necessary  to proceed with endovaginal exam following the transabdominal exam to visualize the uterus and ovaries and endometrium. COMPARISON:   MRI pelvis 03/07/2016 FINDINGS: Uterus Measurements: 7.9 x 3.4 x 3.8 cm. No fibroids or other mass visualized. Endometrium Thickness: 2.9 mm.  No focal abnormality visualized. Right ovary Measurements: 2.2 x 1.4 x 1.6 cm. Normal appearance/no adnexal mass. Left ovary Not visualized Other findings No abnormal free fluid.  Foley catheter is present in the bladder IMPRESSION: 1. Nonvisualization of the left ovary 2. Otherwise negative pelvic ultrasound Electronically Signed   By: Donavan Foil M.D.   On: 04/05/2016 14:44   US Renal  Result Date: 04/05/2016 CLINICAL DATA:  41 y/o F; urinary tract infection. Traumatic catheterization. EXAM: RENAL / URINARY TRACT ULTRASOUND COMPLETE COMPARISON:  None. FINDINGS: Right Kidney: Length: 11.8 cm. Echogenicity is within normal limits. No mass identified. Mild hydronephrosis. Left Kidney: Length: 12.2 cm. Echogenicity within normal limits. No mass or hydronephrosis visualized. Bladder: Foley catheter in situ. IMPRESSION: Mild right hydronephrosis. Electronically Signed   By: Kristine Garbe M.D.   On: 04/05/2016 14:40     Medical Consultants:    None.  Anti-Infectives:   Rocephin  Subjective:    Deanna Baldwin patient has no new complaints she relates she feels better than yesterday.  Objective:    Vitals:   04/05/16 1806 04/05/16 1832 04/05/16 2040 04/06/16 0517  BP: 1'01/62 98/57 99/61 ' 108/64  Pulse:  (!) 125 (!) 124 (!) 114  Resp:  24  18  Temp:   (!) 100.7 F (38.2 C) 100.2 F (37.9 C)  TempSrc:    Oral  SpO2:  96% 99% 98%    Intake/Output Summary (Last 24 hours) at 04/06/16 1287 Last data filed at 04/06/16 0540  Gross per 24 hour  Intake          3208.33 ml  Output              700 ml  Net          2508.33 ml   There were no vitals filed for this visit.  Exam: General exam: In no acute distress. Respiratory system: Good air movement and clear to auscultation. Cardiovascular system: S1 & S2 heard, RRR. No  JVD. Gastrointestinal system: Abdomen is nondistended, soft and nontender.  Central nervous system: Alert and oriented. No focal neurological deficits. Extremities: No pedal edema. Skin: No rashes, lesions or ulcers Psychiatry: Judgement and insight appear normal. Mood & affect appropriate.    Data Reviewed:    Labs: Basic Metabolic Panel:  Recent Labs Lab 04/05/16 1002  NA 141  K 3.7  CL 100*  CO2 31  GLUCOSE 92  BUN 10  CREATININE 0.58  CALCIUM 9.9  MG 1.7   GFR CrCl cannot be calculated (Unknown ideal weight.). Liver Function Tests: No results for input(s): AST, ALT, ALKPHOS, BILITOT, PROT, ALBUMIN in the last 168 hours. No results for input(s): LIPASE, AMYLASE in the last 168 hours. No results for input(s): AMMONIA in the last 168 hours. Coagulation profile No results for input(s): INR, PROTIME in the last 168 hours.  CBC:  Recent Labs Lab 04/05/16 1002  WBC 10.9*  NEUTROABS 9.0*  HGB 10.3*  HCT 33.0*  MCV 89.7  PLT 632*   Cardiac Enzymes: No results for input(s): CKTOTAL, CKMB, CKMBINDEX, TROPONINI in the last 168 hours. BNP (last 3 results) No results for input(s): PROBNP in the last 8760 hours. CBG: No results for input(s): GLUCAP in the last 168 hours. D-Dimer: No  results for input(s): DDIMER in the last 72 hours. Hgb A1c: No results for input(s): HGBA1C in the last 72 hours. Lipid Profile: No results for input(s): CHOL, HDL, LDLCALC, TRIG, CHOLHDL, LDLDIRECT in the last 72 hours. Thyroid function studies: No results for input(s): TSH, T4TOTAL, T3FREE, THYROIDAB in the last 72 hours.  Invalid input(s): FREET3 Anemia work up: No results for input(s): VITAMINB12, FOLATE, FERRITIN, TIBC, IRON, RETICCTPCT in the last 72 hours. Sepsis Labs:  Recent Labs Lab 04/05/16 1002 04/05/16 1012  WBC 10.9*  --   LATICACIDVEN  --  0.86   Microbiology Recent Results (from the past 240 hour(s))  MRSA PCR Screening     Status: None   Collection Time:  04/05/16 11:01 PM  Result Value Ref Range Status   MRSA by PCR NEGATIVE NEGATIVE Final    Comment:        The GeneXpert MRSA Assay (FDA approved for NASAL specimens only), is one component of a comprehensive MRSA colonization surveillance program. It is not intended to diagnose MRSA infection nor to guide or monitor treatment for MRSA infections.      Medications:   . cefTRIAXone (ROCEPHIN)  IV  2 g Intravenous Q24H  . enoxaparin (LOVENOX) injection  30 mg Subcutaneous Q24H  . sodium chloride flush  3 mL Intravenous Q12H   Continuous Infusions: . sodium chloride 100 mL/hr at 04/06/16 0443    Time spent: 25 min   LOS: 1 day   Charlynne Cousins  [facility] Pager [phone]  *Please refer to [email or web address], password [email or web address] to get updated schedule on who will round on this patient, as hospitalists switch teams weekly. If 7PM-7AM, please contact night-coverage at [email or web address], password [email or web address] for any overnight needs.  04/06/2016, 8:12 AM

## 2016-04-06 NOTE — Progress Notes (Addendum)
CRITICAL VALUE ALERT  Critical value received:  Hemoglobin 6.7   Date of notification:  04/06/2016  Time of notification:  0827  Critical value read back:Yes.    Nurse who received alert:  Novella Rob  MD notified (1st page):  Dr. Aileen Fass  Time of first page:  0827  MD notified (2nd page):  Time of second page:  Responding MD:    Time MD responded:    Repeat STAT CBC ordered. Will continue to monitor.

## 2016-04-07 ENCOUNTER — Inpatient Hospital Stay (HOSPITAL_COMMUNITY): Payer: Medicare Other

## 2016-04-07 DIAGNOSIS — G8220 Paraplegia, unspecified: Secondary | ICD-10-CM

## 2016-04-07 DIAGNOSIS — D62 Acute posthemorrhagic anemia: Secondary | ICD-10-CM

## 2016-04-07 DIAGNOSIS — N10 Acute tubulo-interstitial nephritis: Secondary | ICD-10-CM

## 2016-04-07 DIAGNOSIS — L89153 Pressure ulcer of sacral region, stage 3: Secondary | ICD-10-CM

## 2016-04-07 DIAGNOSIS — G35 Multiple sclerosis: Secondary | ICD-10-CM

## 2016-04-07 DIAGNOSIS — E43 Unspecified severe protein-calorie malnutrition: Secondary | ICD-10-CM

## 2016-04-07 LAB — URINE CULTURE: Culture: >=100000 — AB

## 2016-04-07 LAB — BASIC METABOLIC PANEL
ANION GAP: 8 (ref 5–15)
BUN: 6 mg/dL (ref 6–20)
CALCIUM: 8.8 mg/dL — AB (ref 8.9–10.3)
CO2: 25 mmol/L (ref 22–32)
Chloride: 109 mmol/L (ref 101–111)
Creatinine, Ser: 0.38 mg/dL — ABNORMAL LOW (ref 0.44–1.00)
GFR calc Af Amer: >60 mL/min (ref >60)
GLUCOSE: 82 mg/dL (ref 65–99)
Potassium: 4.2 mmol/L (ref 3.5–5.1)
SODIUM: 142 mmol/L (ref 135–145)

## 2016-04-07 LAB — CBC
HCT: 22.8 % — ABNORMAL LOW (ref 36.0–46.0)
Hemoglobin: 7 g/dL — ABNORMAL LOW (ref 12.0–15.0)
MCH: 27.8 pg (ref 26.0–34.0)
MCHC: 30.7 g/dL (ref 30.0–36.0)
MCV: 90.5 fL (ref 78.0–100.0)
PLATELETS: 517 10*3/uL — AB (ref 150–400)
RBC: 2.52 MIL/uL — ABNORMAL LOW (ref 3.87–5.11)
RDW: 15.8 % — AB (ref 11.5–15.5)
WBC: 7.9 10*3/uL (ref 4.0–10.5)

## 2016-04-07 LAB — IRON AND TIBC
IRON: 14 ug/dL — AB (ref 28–170)
Saturation Ratios: 8 % — ABNORMAL LOW (ref 10.4–31.8)
TIBC: 174 ug/dL — ABNORMAL LOW (ref 250–450)
UIBC: 160 ug/dL

## 2016-04-07 LAB — HEMOGLOBIN AND HEMATOCRIT, BLOOD
HCT: 28.7 % — ABNORMAL LOW (ref 36.0–46.0)
Hemoglobin: 9.3 g/dL — ABNORMAL LOW (ref 12.0–15.0)

## 2016-04-07 LAB — D-DIMER, QUANTITATIVE (NOT AT ARMC): D DIMER QUANT: 3.67 ug{FEU}/mL — AB (ref 0.00–0.50)

## 2016-04-07 LAB — FERRITIN: FERRITIN: 276 ng/mL (ref 11–307)

## 2016-04-07 LAB — PREPARE RBC (CROSSMATCH)

## 2016-04-07 LAB — VITAMIN B12: Vitamin B-12: 423 pg/mL (ref 180–914)

## 2016-04-07 LAB — FOLATE: FOLATE: 10.1 ng/mL (ref >5.9)

## 2016-04-07 LAB — RETICULOCYTES
RBC.: 2.45 MIL/uL — AB (ref 3.87–5.11)
RETIC COUNT ABSOLUTE: 27 10*3/uL (ref 19.0–186.0)
Retic Ct Pct: 1.1 % (ref 0.4–3.1)

## 2016-04-07 LAB — TSH: TSH: 1.741 u[IU]/mL (ref 0.350–4.500)

## 2016-04-07 LAB — ABO/RH: ABO/RH(D): AB POS

## 2016-04-07 MED ORDER — SODIUM CHLORIDE 0.9 % IV SOLN
Freq: Once | INTRAVENOUS | Status: AC
  Administered 2016-04-07: 10:00:00 via INTRAVENOUS

## 2016-04-07 MED ORDER — IOPAMIDOL (ISOVUE-370) INJECTION 76%
100.0000 mL | Freq: Once | INTRAVENOUS | Status: AC | PRN
  Administered 2016-04-07: 70 mL via INTRAVENOUS

## 2016-04-07 MED ORDER — FERROUS SULFATE 325 (65 FE) MG PO TABS
325.0000 mg | ORAL_TABLET | Freq: Two times a day (BID) | ORAL | Status: DC
  Administered 2016-04-07 – 2016-04-08 (×2): 325 mg via ORAL
  Filled 2016-04-07 (×2): qty 1

## 2016-04-07 MED ORDER — IOPAMIDOL (ISOVUE-370) INJECTION 76%
INTRAVENOUS | Status: AC
  Filled 2016-04-07: qty 100

## 2016-04-07 NOTE — Progress Notes (Signed)
PROGRESS NOTE  Deanna Baldwin  FFM:[id number] DOB: [birth year] DOA: 04/05/2016 PCP: [name], MD  Brief Narrative:  Deanna Baldwin is an 41 y.o. 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  She has been persistently tachycardic.  Urine culture growing citrobacter.  Dramatic decline in hemoglobin since admission and requiring blood transfusion today, but no obvious bleeding.    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  LFTs mildly elevated but bilirubin normal so do not suspect hemolysis.  Worsening anemia may be contributing to her sinus tachycardia -  Iron studies suggest chronic inflammation but may have some superimposed iron deficiency -  Start oral iron supplementation -  Vitamin B12, folate, and TSH are wnl -  Occult stool -  Reticulocytes are decreased (marrow suppression from myositis and UTI?) -  Transfuse 1 unit PRBC  Sinus tachycardia, worsening, currently in 120s on telemetry.  Ceftriaxone should be adequate given sensitivities -  Transfuse 1 unit -  TSH wnl -  D-dimer elevated -  CT angio PE protocol ordered  History right gluteal pyomyositis with stage II to 3 sacral decubitus ulcer: She recently completed a course of Doxy. Wound care was consulted who recommended local care measures, frequent repositioning, and daily dressing  changes.  Multiple sclerosis: Continue current regimen.  Neutropenia, resolved She status post a bone marrow biopsy in 03/12/2016 to follow-up with oncologist an outpatient.  Severe protein caloric malnutrition: BMI 16, continue ensure drinks and regular diet  Thrombocytosis: Likely reactive on admission 632, today is 514. Continue to monitor.  Hypokalemia, resolved with oral potassium repletion  DVT prophylaxis:  lovenox Code Status:  full Family Communication:  patient alone Disposition Plan:  Pending further evaluation and treatment of sinus tachycardia. Blood transfusion today.  F/u CT angio chest.     Consultants:   none  Procedures:  none  Antimicrobials:  Anti-infectives    Start     Dose/Rate Route Frequency Ordered Stop   04/06/16 1200  cefTRIAXone (ROCEPHIN) 2 g in dextrose 5 % 50 mL IVPB  Status:  Discontinued     2 g 100 mL/hr over 30 Minutes Intravenous Every 24 hours 04/05/16 1252 04/06/16 0815   04/06/16 1000  ampicillin-sulbactam (UNASYN) 1.5 g in sodium chloride 0.9 % 50 mL IVPB  Status:  Discontinued     1.5 g 100 mL/hr over 30 Minutes Intravenous Every 6 hours 04/06/16 0815 04/06/16 0823   04/06/16 0900  cefTRIAXone (ROCEPHIN) 1 g in dextrose 5 % 50 mL IVPB     1 g 100 mL/hr over 30 Minutes Intravenous Every 24 hours 04/06/16 0823     04/05/16 1300  cefTRIAXone (ROCEPHIN) 2 g in dextrose 5 % 50 mL IVPB  Status:  Discontinued     2 g 100 mL/hr over 30 Minutes Intravenous  Once 04/05/16 1252 04/05/16 1252   04/05/16 1215  cefTRIAXone (ROCEPHIN) 1 g in dextrose 5 % 50 mL IVPB     1 g 100  mL/hr over 30 Minutes Intravenous  Once 04/05/16 1208 04/05/16 1253       Subjective: Difficult historian.  Does not think that her urinary problems are related to her MS.  Feels that she caused her myositis and ulcers because she slept in her wheelchair "on the glass table."    Objective: Vitals:   04/06/16 2054 04/07/16 0542 04/07/16 1330 04/07/16 1359   BP: (!) 94/56 102/83 104/70 94/68  Pulse: (!) 113 (!) 108 (!) 103 (!) 101  Resp: _0 Temp: 99.9 F (37.7 C) 99.1 F (37.3 C) 98.6 F (37 C) 98.1 F (36.7 C)  TempSrc: Oral Oral Oral Oral  SpO2: 100% 98% 100% 100%    Intake/Output Summary (Last 24 hours) at 04/07/16 1406 Last data filed at 04/07/16 0542  Gross per 24 hour  Intake          2523.33 ml  Output             1850 ml  Net           673.33 ml   There were no vitals filed for this visit.  Examination:  General exam:  Adult female, cachectic and frail appearing.  No acute distress.  HEENT:  NCAT, MMM Respiratory system: Clear to auscultation bilaterally Cardiovascular system:  Tachycardic to the 120s on telemetry and on exam.  No murmurs, rubs, gallops or clicks.  Warm extremities Gastrointestinal system: Normal active bowel sounds, soft, rectus abdominus muscles are unable to be relaxed by patient but she denies abdominal pain on exam.  Abdomen is soft in areas surrounding rectus muscle.   MSK:  Decreased tone and bulk, no lower extremity edema Neuro:  Diffusely weak.   Psych:  Tangiential, limited medical insight    Data Reviewed: I have personally reviewed following labs and imaging studies  CBC:  Recent Labs Lab 04/05/16 1002 04/06/16 0713 04/06/16 0915 04/07/16 0823  WBC 10.9* 8.4 9.0 7.9  NEUTROABS 9.0*  --   --   --   HGB 10.3* 6.7* 8.2* 7.0*  HCT 33.0* 21.6* 26.7* 22.8*  MCV 89.7 90.0 91.1 90.5  PLT 632* 514* 532* 622*   Basic Metabolic Panel:  Recent Labs Lab 04/05/16 1002 04/06/16 0713 04/07/16 0823  NA 141 143 142  K 3.7 3.2* 4.2  CL 100* 112* 109  CO2 _1 GLUCOSE 92 89 82  BUN _2 CREATININE 0.58 0.40* 0.38*  CALCIUM 9.9 8.6* 8.8*  MG 1.7  --   --    GFR: CrCl cannot be calculated (Unknown ideal weight.). Liver Function Tests:  Recent Labs Lab 04/06/16 0713  AST 27  ALT 55*  ALKPHOS 75  BILITOT 0.5  PROT 5.4*  ALBUMIN 2.1*   No results for  input(s): LIPASE, AMYLASE in the last 168 hours. No results for input(s): AMMONIA in the last 168 hours. Coagulation Profile: No results for input(s): INR, PROTIME in the last 168 hours. Cardiac Enzymes: No results for input(s): CKTOTAL, CKMB, CKMBINDEX, TROPONINI in the last 168 hours. BNP (last 3 results) No results for input(s): PROBNP in the last 8760 hours. HbA1C: No results for input(s): HGBA1C in the last 72 hours. CBG: No results for input(s): GLUCAP in the last 168 hours. Lipid Profile: No results for input(s): CHOL, HDL, LDLCALC, TRIG, CHOLHDL, LDLDIRECT in the last 72 hours. Thyroid Function Tests:  Recent Labs  04/07/16 1045  TSH 1.741   Anemia Panel:  Recent Labs  04/07/16 1045  VITAMINB12 423  FOLATE 10.1  FERRITIN 276  TIBC 174*  IRON 14*  RETICCTPCT 1.1   Urine analysis:    Component Value Date/Time   COLORURINE YELLOW 04/05/2016 1056   APPEARANCEUR TURBID (A) 04/05/2016 1056   LABSPEC 1.015 04/05/2016 1056   PHURINE 5.0 04/05/2016 1056   GLUCOSEU NEGATIVE 04/05/2016 1056   HGBUR NEGATIVE 04/05/2016 1056   BILIRUBINUR NEGATIVE 04/05/2016 1056   KETONESUR NEGATIVE 04/05/2016 1056   PROTEINUR 100 (A) 04/05/2016 1056   UROBILINOGEN 0.2 06/25/2008 1551   NITRITE POSITIVE (A) 04/05/2016 1056   LEUKOCYTESUR MODERATE (A) 04/05/2016 1056   Sepsis Labs: _0 (procalcitonin:4,lacticidven:4)  ) Recent Results (from the past 240 hour(s))  Urine culture     Status: Abnormal   Collection Time: 04/05/16 10:56 AM  Result Value Ref Range Status   Specimen Description URINE, CLEAN CATCH  Final   Special Requests NONE  Final   Culture >=100,000 COLONIES/mL CITROBACTER FREUNDII (A)  Final   Report Status 04/07/2016 FINAL  Final   Organism ID, Bacteria CITROBACTER FREUNDII (A)  Final      Susceptibility   Citrobacter freundii - MIC*    CEFAZOLIN >=64 RESISTANT Resistant     CEFTRIAXONE <=1 SENSITIVE Sensitive     CIPROFLOXACIN <=0.25 SENSITIVE  Sensitive     GENTAMICIN >=16 RESISTANT Resistant     IMIPENEM <=0.25 SENSITIVE Sensitive     NITROFURANTOIN <=16 SENSITIVE Sensitive     TRIMETH/SULFA >=320 RESISTANT Resistant     PIP/TAZO <=4 SENSITIVE Sensitive     * >=100,000 COLONIES/mL CITROBACTER FREUNDII  Culture, blood (Routine X 2) w Reflex to ID Panel     Status: None (Preliminary result)   Collection Time: 04/05/16  1:35 PM  Result Value Ref Range Status   Specimen Description BLOOD RIGHT ANTECUBITAL  Final   Special Requests IN PEDIATRIC BOTTLE 3CC  Final   Culture   Final    NO GROWTH 2 DAYS Performed at [facility], [address], [place], [place] [address]    Report Status PENDING  Incomplete  Culture, blood (Routine X 2) w Reflex to ID Panel     Status: None (Preliminary result)   Collection Time: 04/05/16  2:36 PM  Result Value Ref Range Status   Specimen Description BLOOD RIGHT ANTECUBITAL  Final   Special Requests BOTTLES DRAWN AEROBIC AND ANAEROBIC 5 ML  Final   Culture   Final    NO GROWTH 2 DAYS Performed at [facility], [place] [address] [place], [place] [address]    Report Status PENDING  Incomplete  MRSA PCR Screening     Status: None   Collection Time: 04/05/16 11:01 PM  Result Value Ref Range Status   MRSA by PCR NEGATIVE NEGATIVE Final    Comment:        The GeneXpert MRSA Assay (FDA approved for NASAL specimens only), is one component of a comprehensive MRSA colonization surveillance program. It is not intended to diagnose MRSA infection nor to guide or monitor treatment for MRSA infections.       Radiology Studies: US Transvaginal Non-ob  Result Date: 04/05/2016 CLINICAL DATA:  Irregular menstrual. EXAM: TRANSABDOMINAL AND TRANSVAGINAL ULTRASOUND OF PELVIS TECHNIQUE: Both transabdominal and transvaginal ultrasound examinations of the pelvis were performed. Transabdominal technique was performed for global imaging of the pelvis including uterus, ovaries, adnexal regions, and  pelvic cul-de-sac. It was necessary to proceed with endovaginal exam following the transabdominal exam to visualize the uterus and ovaries and endometrium.  COMPARISON:  MRI pelvis 03/07/2016 FINDINGS: Uterus Measurements: 7.9 x 3.4 x 3.8 cm. No fibroids or other mass visualized. Endometrium Thickness: 2.9 mm.  No focal abnormality visualized. Right ovary Measurements: 2.2 x 1.4 x 1.6 cm. Normal appearance/no adnexal mass. Left ovary Not visualized Other findings No abnormal free fluid.  Foley catheter is present in the bladder IMPRESSION: 1. Nonvisualization of the left ovary 2. Otherwise negative pelvic ultrasound Electronically Signed   By: Donavan Foil M.D.   On: 04/05/2016 14:44   US Pelvis Complete  Result Date: 04/05/2016 CLINICAL DATA:  Irregular menstrual. EXAM: TRANSABDOMINAL AND TRANSVAGINAL ULTRASOUND OF PELVIS TECHNIQUE: Both transabdominal and transvaginal ultrasound examinations of the pelvis were performed. Transabdominal technique was performed for global imaging of the pelvis including uterus, ovaries, adnexal regions, and pelvic cul-de-sac. It was necessary to proceed with endovaginal exam following the transabdominal exam to visualize the uterus and ovaries and endometrium. COMPARISON:  MRI pelvis 03/07/2016 FINDINGS: Uterus Measurements: 7.9 x 3.4 x 3.8 cm. No fibroids or other mass visualized. Endometrium Thickness: 2.9 mm.  No focal abnormality visualized. Right ovary Measurements: 2.2 x 1.4 x 1.6 cm. Normal appearance/no adnexal mass. Left ovary Not visualized Other findings No abnormal free fluid.  Foley catheter is present in the bladder IMPRESSION: 1. Nonvisualization of the left ovary 2. Otherwise negative pelvic ultrasound Electronically Signed   By: Donavan Foil M.D.   On: 04/05/2016 14:44   US Renal  Result Date: 04/05/2016 CLINICAL DATA:  41 y/o F; urinary tract infection. Traumatic catheterization. EXAM: RENAL / URINARY TRACT ULTRASOUND COMPLETE COMPARISON:  None. FINDINGS:  Right Kidney: Length: 11.8 cm. Echogenicity is within normal limits. No mass identified. Mild hydronephrosis. Left Kidney: Length: 12.2 cm. Echogenicity within normal limits. No mass or hydronephrosis visualized. Bladder: Foley catheter in situ. IMPRESSION: Mild right hydronephrosis. Electronically Signed   By: Kristine Garbe M.D.   On: 04/05/2016 14:40   Dg Chest Port 1 View  Result Date: 04/06/2016 CLINICAL DATA:  Fever. EXAM: PORTABLE CHEST 1 VIEW COMPARISON:  03/07/2016 FINDINGS: Normal heart size and mediastinal contours. No acute infiltrate or edema. Nipple shadows present. No effusion or pneumothorax. No acute osseous findings. IMPRESSION: No evidence of pneumonia. Electronically Signed   By: Monte Fantasia M.D.   On: 04/06/2016 09:24     Scheduled Meds: . cefTRIAXone (ROCEPHIN)  IV  1 g Intravenous Q24H  . enoxaparin (LOVENOX) injection  30 mg Subcutaneous Q24H  . ferrous sulfate  325 mg Oral BID WC  . sodium chloride flush  3 mL Intravenous Q12H   Continuous Infusions: . sodium chloride 100 mL/hr at 04/07/16 0455     LOS: 2 days    Time spent: 30 min    Janece Canterbury, MD [facility] Pager [phone]  If 7PM-7AM, please contact night-coverage [email or web address] Password [email or web address] 04/07/2016, 2:06 PM

## 2016-04-08 DIAGNOSIS — L89890 Pressure ulcer of other site, unstageable: Secondary | ICD-10-CM | POA: Diagnosis not present

## 2016-04-08 DIAGNOSIS — S31809A Unspecified open wound of unspecified buttock, initial encounter: Secondary | ICD-10-CM | POA: Diagnosis not present

## 2016-04-08 DIAGNOSIS — R1311 Dysphagia, oral phase: Secondary | ICD-10-CM | POA: Diagnosis not present

## 2016-04-08 DIAGNOSIS — R634 Abnormal weight loss: Secondary | ICD-10-CM | POA: Diagnosis not present

## 2016-04-08 DIAGNOSIS — D638 Anemia in other chronic diseases classified elsewhere: Secondary | ICD-10-CM | POA: Diagnosis not present

## 2016-04-08 DIAGNOSIS — J189 Pneumonia, unspecified organism: Secondary | ICD-10-CM | POA: Diagnosis not present

## 2016-04-08 DIAGNOSIS — L89610 Pressure ulcer of right heel, unstageable: Secondary | ICD-10-CM | POA: Diagnosis present

## 2016-04-08 DIAGNOSIS — E538 Deficiency of other specified B group vitamins: Secondary | ICD-10-CM | POA: Diagnosis not present

## 2016-04-08 DIAGNOSIS — G8220 Paraplegia, unspecified: Secondary | ICD-10-CM | POA: Diagnosis not present

## 2016-04-08 DIAGNOSIS — L89154 Pressure ulcer of sacral region, stage 4: Secondary | ICD-10-CM | POA: Diagnosis not present

## 2016-04-08 DIAGNOSIS — Z87891 Personal history of nicotine dependence: Secondary | ICD-10-CM | POA: Diagnosis not present

## 2016-04-08 DIAGNOSIS — D509 Iron deficiency anemia, unspecified: Secondary | ICD-10-CM

## 2016-04-08 DIAGNOSIS — D508 Other iron deficiency anemias: Secondary | ICD-10-CM | POA: Diagnosis not present

## 2016-04-08 DIAGNOSIS — L89143 Pressure ulcer of left lower back, stage 3: Secondary | ICD-10-CM | POA: Diagnosis not present

## 2016-04-08 DIAGNOSIS — A498 Other bacterial infections of unspecified site: Secondary | ICD-10-CM

## 2016-04-08 DIAGNOSIS — D649 Anemia, unspecified: Secondary | ICD-10-CM | POA: Diagnosis not present

## 2016-04-08 DIAGNOSIS — N360 Urethral fistula: Secondary | ICD-10-CM | POA: Diagnosis not present

## 2016-04-08 DIAGNOSIS — Z9289 Personal history of other medical treatment: Secondary | ICD-10-CM | POA: Diagnosis not present

## 2016-04-08 DIAGNOSIS — L89009 Pressure ulcer of unspecified elbow, unspecified stage: Secondary | ICD-10-CM | POA: Diagnosis not present

## 2016-04-08 DIAGNOSIS — R630 Anorexia: Secondary | ICD-10-CM | POA: Diagnosis not present

## 2016-04-08 DIAGNOSIS — R0602 Shortness of breath: Secondary | ICD-10-CM | POA: Diagnosis not present

## 2016-04-08 DIAGNOSIS — N368 Other specified disorders of urethra: Secondary | ICD-10-CM | POA: Diagnosis present

## 2016-04-08 DIAGNOSIS — Z1639 Resistance to other specified antimicrobial drug: Secondary | ICD-10-CM | POA: Diagnosis present

## 2016-04-08 DIAGNOSIS — N319 Neuromuscular dysfunction of bladder, unspecified: Secondary | ICD-10-CM | POA: Diagnosis not present

## 2016-04-08 DIAGNOSIS — R52 Pain, unspecified: Secondary | ICD-10-CM | POA: Diagnosis not present

## 2016-04-08 DIAGNOSIS — K6812 Psoas muscle abscess: Secondary | ICD-10-CM | POA: Diagnosis not present

## 2016-04-08 DIAGNOSIS — R945 Abnormal results of liver function studies: Secondary | ICD-10-CM | POA: Diagnosis not present

## 2016-04-08 DIAGNOSIS — R000 Tachycardia, unspecified: Secondary | ICD-10-CM | POA: Diagnosis not present

## 2016-04-08 DIAGNOSIS — N318 Other neuromuscular dysfunction of bladder: Secondary | ICD-10-CM | POA: Diagnosis not present

## 2016-04-08 DIAGNOSIS — G35 Multiple sclerosis: Secondary | ICD-10-CM | POA: Diagnosis not present

## 2016-04-08 DIAGNOSIS — R1310 Dysphagia, unspecified: Secondary | ICD-10-CM | POA: Diagnosis present

## 2016-04-08 DIAGNOSIS — J690 Pneumonitis due to inhalation of food and vomit: Secondary | ICD-10-CM | POA: Diagnosis not present

## 2016-04-08 DIAGNOSIS — N10 Acute tubulo-interstitial nephritis: Secondary | ICD-10-CM | POA: Diagnosis not present

## 2016-04-08 DIAGNOSIS — R6520 Severe sepsis without septic shock: Secondary | ICD-10-CM | POA: Diagnosis not present

## 2016-04-08 DIAGNOSIS — K5900 Constipation, unspecified: Secondary | ICD-10-CM | POA: Diagnosis present

## 2016-04-08 DIAGNOSIS — L89150 Pressure ulcer of sacral region, unstageable: Secondary | ICD-10-CM | POA: Diagnosis not present

## 2016-04-08 DIAGNOSIS — A419 Sepsis, unspecified organism: Secondary | ICD-10-CM | POA: Diagnosis present

## 2016-04-08 DIAGNOSIS — L89894 Pressure ulcer of other site, stage 4: Secondary | ICD-10-CM | POA: Diagnosis present

## 2016-04-08 DIAGNOSIS — R9431 Abnormal electrocardiogram [ECG] [EKG]: Secondary | ICD-10-CM | POA: Diagnosis not present

## 2016-04-08 DIAGNOSIS — T83091A Other mechanical complication of indwelling urethral catheter, initial encounter: Secondary | ICD-10-CM | POA: Diagnosis not present

## 2016-04-08 DIAGNOSIS — L8945 Pressure ulcer of contiguous site of back, buttock and hip, unstageable: Secondary | ICD-10-CM | POA: Diagnosis present

## 2016-04-08 DIAGNOSIS — L89220 Pressure ulcer of left hip, unstageable: Secondary | ICD-10-CM | POA: Diagnosis present

## 2016-04-08 DIAGNOSIS — E119 Type 2 diabetes mellitus without complications: Secondary | ICD-10-CM | POA: Diagnosis not present

## 2016-04-08 DIAGNOSIS — E876 Hypokalemia: Secondary | ICD-10-CM | POA: Diagnosis not present

## 2016-04-08 DIAGNOSIS — Y69 Unspecified misadventure during surgical and medical care: Secondary | ICD-10-CM | POA: Diagnosis not present

## 2016-04-08 DIAGNOSIS — F4320 Adjustment disorder, unspecified: Secondary | ICD-10-CM | POA: Diagnosis not present

## 2016-04-08 DIAGNOSIS — G114 Hereditary spastic paraplegia: Secondary | ICD-10-CM | POA: Diagnosis not present

## 2016-04-08 DIAGNOSIS — R100 Acute abdomen: Secondary | ICD-10-CM | POA: Diagnosis not present

## 2016-04-08 DIAGNOSIS — E860 Dehydration: Secondary | ICD-10-CM | POA: Diagnosis present

## 2016-04-08 DIAGNOSIS — L89153 Pressure ulcer of sacral region, stage 3: Secondary | ICD-10-CM | POA: Diagnosis not present

## 2016-04-08 DIAGNOSIS — R509 Fever, unspecified: Secondary | ICD-10-CM | POA: Diagnosis present

## 2016-04-08 DIAGNOSIS — R1312 Dysphagia, oropharyngeal phase: Secondary | ICD-10-CM | POA: Diagnosis not present

## 2016-04-08 DIAGNOSIS — L89142 Pressure ulcer of left lower back, stage 2: Secondary | ICD-10-CM | POA: Diagnosis not present

## 2016-04-08 DIAGNOSIS — Z79899 Other long term (current) drug therapy: Secondary | ICD-10-CM | POA: Diagnosis not present

## 2016-04-08 DIAGNOSIS — Z888 Allergy status to other drugs, medicaments and biological substances status: Secondary | ICD-10-CM | POA: Diagnosis not present

## 2016-04-08 DIAGNOSIS — E870 Hyperosmolality and hypernatremia: Secondary | ICD-10-CM | POA: Diagnosis not present

## 2016-04-08 DIAGNOSIS — E43 Unspecified severe protein-calorie malnutrition: Secondary | ICD-10-CM | POA: Diagnosis not present

## 2016-04-08 DIAGNOSIS — N390 Urinary tract infection, site not specified: Secondary | ICD-10-CM | POA: Diagnosis present

## 2016-04-08 DIAGNOSIS — I429 Cardiomyopathy, unspecified: Secondary | ICD-10-CM | POA: Diagnosis present

## 2016-04-08 DIAGNOSIS — D709 Neutropenia, unspecified: Secondary | ICD-10-CM | POA: Diagnosis not present

## 2016-04-08 DIAGNOSIS — M791 Myalgia: Secondary | ICD-10-CM | POA: Diagnosis not present

## 2016-04-08 DIAGNOSIS — E872 Acidosis: Secondary | ICD-10-CM | POA: Diagnosis present

## 2016-04-08 DIAGNOSIS — R799 Abnormal finding of blood chemistry, unspecified: Secondary | ICD-10-CM | POA: Diagnosis not present

## 2016-04-08 DIAGNOSIS — T83098A Other mechanical complication of other indwelling urethral catheter, initial encounter: Secondary | ICD-10-CM | POA: Diagnosis present

## 2016-04-08 DIAGNOSIS — B964 Proteus (mirabilis) (morganii) as the cause of diseases classified elsewhere: Secondary | ICD-10-CM | POA: Diagnosis present

## 2016-04-08 DIAGNOSIS — Z993 Dependence on wheelchair: Secondary | ICD-10-CM | POA: Diagnosis not present

## 2016-04-08 DIAGNOSIS — G8250 Quadriplegia, unspecified: Secondary | ICD-10-CM | POA: Diagnosis not present

## 2016-04-08 LAB — BASIC METABOLIC PANEL
Anion gap: 7 (ref 5–15)
BUN: 6 mg/dL (ref 6–20)
CO2: 28 mmol/L (ref 22–32)
Calcium: 8.8 mg/dL — ABNORMAL LOW (ref 8.9–10.3)
Chloride: 104 mmol/L (ref 101–111)
Creatinine, Ser: 0.34 mg/dL — ABNORMAL LOW (ref 0.44–1.00)
GFR calc Af Amer: >60 mL/min (ref >60)
GLUCOSE: 87 mg/dL (ref 65–99)
POTASSIUM: 3.8 mmol/L (ref 3.5–5.1)
SODIUM: 139 mmol/L (ref 135–145)

## 2016-04-08 LAB — TYPE AND SCREEN
ABO/RH(D): AB POS
ANTIBODY SCREEN: NEGATIVE
Unit division: 0

## 2016-04-08 LAB — CBC
HEMATOCRIT: 27.1 % — AB (ref 36.0–46.0)
Hemoglobin: 8.4 g/dL — ABNORMAL LOW (ref 12.0–15.0)
MCH: 26.2 pg (ref 26.0–34.0)
MCHC: 31 g/dL (ref 30.0–36.0)
MCV: 84.4 fL (ref 78.0–100.0)
PLATELETS: 526 10*3/uL — AB (ref 150–400)
RBC: 3.21 MIL/uL — ABNORMAL LOW (ref 3.87–5.11)
RDW: 17 % — AB (ref 11.5–15.5)
WBC: 6.3 10*3/uL (ref 4.0–10.5)

## 2016-04-08 LAB — BPAM RBC
Blood Product Expiration Date: 201803292359
ISSUE DATE / TIME: 201803071327
Unit Type and Rh: 6200

## 2016-04-08 MED ORDER — CIPROFLOXACIN HCL 500 MG PO TABS: 500.0000 mg | ORAL_TABLET | Freq: Two times a day (BID) | ORAL | 0 refills | Status: DC

## 2016-04-08 MED ORDER — FERROUS SULFATE 325 (65 FE) MG PO TABS: 325.0000 mg | ORAL_TABLET | Freq: Two times a day (BID) | ORAL | 0 refills | Status: DC

## 2016-04-08 NOTE — NC FL2 (Signed)
[place] LEVEL OF CARE SCREENING TOOL     IDENTIFICATION  Patient Name: Deanna Baldwin Birthdate: 04-10-1975 Sex: female Admission Date (Current Location): 04/05/2016  [facility] and [place] Number:  Herbalist and Address:  [facility],  [place], [place]      Provider Number: [id number]  Attending Physician Name and Address:  Janece Canterbury, MD  Relative Name and Phone Number:  Vaughan Basta, aunt, [phone]    Current Level of Care: Hospital Recommended Level of Care: [place] Prior Approval Number:    Date Approved/Denied:   PASRR Number: [id number]  Discharge Plan: SNF    Current Diagnoses: Patient Active Problem List   Diagnosis Date Noted  . Acute blood loss anemia 04/06/2016  . Dysphagia, oral phase 03/17/2016  . Neutropenia ([place])   . Other neutropenia ([place])   . Muscle abscess   . Myofascitis   . Sepsis secondary to UTI ([place])   . Aspiration pneumonia of left lower lobe due to vomit ([place])   . Sacral decubitus ulcer, stage III ([place])   . Multiple sclerosis ([place])   . Protein-calorie malnutrition, severe ([place])   . Hypokalemia 03/05/2016  . Hypernatremia   . Pressure injury of skin 03/03/2016  . 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 Severe sepsis ([place]) 03/02/2016  . Community acquired pneumonia 03/02/2016  . UTI (urinary tract infection) 03/02/2016  . Abnormal LFTs 03/02/2016  . Buttock wound 03/02/2016    Orientation RESPIRATION BLADDER Height & Weight     Self, Time, Situation, Place  Normal Incontinent Weight:   Height:     BEHAVIORAL SYMPTOMS/MOOD NEUROLOGICAL BOWEL NUTRITION STATUS      Continent Diet (Dyshagia 3)  AMBULATORY STATUS COMMUNICATION OF NEEDS Skin   Total Care Verbally PU Stage and Appropriate Care                       Personal Care Assistance Level of Assistance  Bathing, Feeding, Dressing Bathing Assistance: Maximum  assistance Feeding assistance: Limited assistance Dressing Assistance: Maximum assistance     Functional Limitations Info  Sight, Hearing, Speech Sight Info: Adequate Hearing Info: Adequate Speech Info: Adequate    SPECIAL CARE FACTORS FREQUENCY                       Contractures      Additional Factors Info  Code Status, Allergies Code Status Info: Fullcode Allergies Info: NKA           Current Medications (04/08/2016):  This is the current hospital active medication list Current Facility-Administered Medications  Medication Dose Route Frequency Provider Last Rate Last Dose  . acetaminophen (TYLENOL) tablet 650 mg  650 mg Oral Q6H PRN Reyne Dumas, MD   650 mg at 04/08/16 0805   Or  . acetaminophen (TYLENOL) suppository 650 mg  650 mg Rectal Q6H PRN Reyne Dumas, MD      . cefTRIAXone (ROCEPHIN) 1 g in dextrose 5 % 50 mL IVPB  1 g Intravenous Q24H Charlynne Cousins, MD   1 g at 04/08/16 0802  . enoxaparin (LOVENOX) injection 30 mg  30 mg Subcutaneous Q24H Reyne Dumas, MD   30 mg at 04/07/16 2119  . ferrous sulfate tablet 325 mg  325 mg Oral BID WC Janece Canterbury, MD   325 mg at 04/08/16 0801  . levalbuterol (XOPENEX) nebulizer solution 0.63 mg  0.63 mg Nebulization Q6H PRN Reyne Dumas,  MD      . ondansetron (ZOFRAN) tablet 4 mg  4 mg Oral Q6H PRN Reyne Dumas, MD       Or  . ondansetron (ZOFRAN) injection 4 mg  4 mg Intravenous Q6H PRN Reyne Dumas, MD      . sodium chloride flush (NS) 0.9 % injection 3 mL  3 mL Intravenous Q12H Reyne Dumas, MD   3 mL at 04/07/16 2200     Discharge Medications: Please see discharge summary for a list of discharge medications.  Relevant Imaging Results:  Relevant Lab Results:   Additional Information SSN: [id number]    Needs air mattress and hydrotherapy  Lia Hopping, LCSW

## 2016-04-08 NOTE — Clinical Social Work Placement (Signed)
   CLINICAL SOCIAL WORK PLACEMENT  NOTE  Date:  04/08/2016  Patient Details  Name: Deanna Baldwin MRN: [id number] Date of Birth: 07/08/1975  Clinical Social Work is seeking post-discharge placement for this patient at the   level of care (*CSW will initial, date and re-position this form in  chart as items are completed):  No   Patient/family provided with [facility] Work Department's list of facilities offering this level of care within the geographic area requested by the patient (or if unable, by the patient's family).  No   Patient/family informed of their freedom to choose among providers that offer the needed level of care, that participate in Medicare, Medicaid or managed care program needed by the patient, have an available bed and are willing to accept the patient.  No   Patient/family informed of [facility]'s ownership interest in [facility] and [facility], as well as of the fact that they are under no obligation to receive care at these facilities.  PASRR submitted to EDS on       PASRR number received on       Existing PASRR number confirmed on 04/08/16     FL2 transmitted to all facilities in geographic area requested by pt/family on       FL2 transmitted to all facilities within larger geographic area on       Patient informed that his/her managed care company has contracts with or will negotiate with certain facilities, including the following:        No   Patient/family informed of bed offers received.  Patient chooses bed at [facility]     Physician recommends and patient chooses bed at      Patient to be transferred to [facility] on 04/08/16.  Patient to be transferred to facility by [facility]     Patient family notified on 04/08/16 of transfer.  Name of family member notified:  Vaughan Basta left voicemail     PHYSICIAN Please sign FL2, Please prepare prescriptions     Additional Comment:  Patient  is returning to SNF.   _______________________________________________ Lia Hopping, LCSW 04/08/2016, 10:29 AM

## 2016-04-08 NOTE — Discharge Summary (Addendum)
Physician Discharge Summary  Deanna Baldwin VEL:[id number] DOB: 1975-04-08 DOA: 04/05/2016  PCP: [name], MD  Admit date: 04/05/2016 Discharge date: 04/08/2016  Admitted From: SNF  Disposition:  SNF  Recommendations for Outpatient Follow-up:  1. Repeat BMP and CBC in 1-2 weeks to follow up anemia and electrolytes 2. [facility] in 1 month regarding indwelling foley catheter 3. F/u results of pending blood cultures 4. Screening for occult GI blood loss 5. F/u with Oncology regarding recent neutropenia and anemia 6. Last day of ciprofloxacin on 3/12, then stop  Home Health:  Ongoing PT/OT  Equipment/Devices:  none  Discharge Condition:  Stable, improved CODE STATUS:  Full code  Diet recommendation:  Dysphagia 3 with honey thickened liquids  Brief/Interim Summary:  Deanna Simentonis an 41 y.o.femalepast medical history significant for multiple sclerosis, spastic paraplegia on Rituxan,severe protein calorie malnutrition, from [place] who presented to the emergency department for evaluation of traumatic urinary catheterization last nightand concern for urethral injury.  Although there was no evidence of serious urethral trauma, she was found to have a urinary tract infection and tachycardia.  She was admitted for IV antibiotics.  Her urine culture grew citrobacter.  Her course was complicated by anemia which responded to blood transfusion.  Her hemoglobin is now at baseline.  She had no obvious bleeding during hospitalization and I suspect she has some iron deficiency from malnutrition.  Her folate, vitamin B12 levels, and TSH were normal.  She will need screening for occult GI blood loss.  She still has occasional periods.  Her sinus tachycardia improved with blood transfusion.  TSH was normal and CT angio chest was negative for PE.  I suspect she is chronically somewhat tachycardia because of her frailty and poor functional status due to MS.    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  LFTs mildly elevated but bilirubin normal so do not suspect hemolysis.  Worsening anemia may be contributing to her sinus tachycardia -  Iron studies suggest chronic inflammation but may have some superimposed iron deficiency -  Started oral iron supplementation -  Vitamin B12, folate, and TSH are wnl -  Occult stool unable to be obtained -  Reticulocytes are decreased (marrow suppression from myositis and UTI?) -  Transfused 1 unit PRBC on 3/7 with improvement in tachycardia  Sinus tachycardia up to 120s.  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 Wound care was consulted who recommended local care measures, frequent repositioning, and daily dressing changes.  Multiple sclerosis: Continued previous regimen.  Neutropenia, resolved She status post a bone marrow biopsy in 03/12/2016 to follow-up with oncologist an outpatient.  Severe protein caloric malnutrition: BMI 16, continue ensure drinks and  regular diet  Thrombocytosis: Likely reactive on admission 632, today is 514. Continue to  monitor.  Hypokalemia, resolved with oral potassium repletion  Discharge Instructions  Discharge Instructions    Call MD for:  difficulty breathing, headache or visual disturbances    Complete by:  As directed    Call MD for:  extreme fatigue    Complete by:  As directed    Call MD for:  hives    Complete by:  As directed    Call MD for:  persistant dizziness or light-headedness    Complete by:  As directed    Call MD for:  persistant nausea and vomiting    Complete by:  As directed    Call MD for:  severe uncontrolled pain    Complete by:  As directed    Call MD for:  temperature >100.4    Complete by:  As directed    Diet general    Complete by:  As directed    Increase activity slowly    Complete by:  As directed        Medication List    TAKE these medications   acetaminophen 325 MG tablet Commonly known as:  TYLENOL Take 2 tablets (650 mg total) by mouth every 6 (six) hours as needed for mild pain (or Fever >/= 101).   bisacodyl 5 MG EC tablet Commonly known as:  DULCOLAX Take 1 tablet (5 mg total) by mouth daily as needed for moderate constipation.   ciprofloxacin 500 MG tablet Commonly known as:  CIPRO Take 1 tablet (500 mg total) by mouth 2 (two) times daily.   feeding supplement (ENSURE ENLIVE) Liqd Take 237 mLs by mouth 2 (two) times daily between meals.   feeding supplement (PRO-STAT SUGAR FREE 64) Liqd Take 30 mLs by mouth 3 (three) times daily with meals.   ferrous sulfate 325 (65 FE) MG tablet Take 1 tablet (325 mg total) by mouth 2 (two) times daily with a meal.   multivitamin with minerals Tabs tablet Take 1 tablet by mouth daily.   polyethylene glycol packet Commonly known as:  MIRALAX / GLYCOLAX Take 17 g by mouth daily.   RESOURCE THICKENUP CLEAR Powd For honey thick liquid       Contact information for follow-up providers    PHARR,EMILY, MD. Schedule an appointment as soon as possible for a visit.   Specialty:  Neurology Why:   as needed Contact information: [address] [place] [address] [place]. Schedule an appointment as soon as possible for a visit in 1 month(s).   Contact information: [place] [phone]           Contact information for after-discharge care    Destination    [facility] [place] [facility] .   Specialty:  [place] information: [address] [place] [phone]                 No Known Allergies  Consultations: none   Procedures/Studies: Ct Angio Chest Pe W Or Wo Contrast  Result Date: 04/07/2016 CLINICAL DATA:  Multiple sclerosis and spastic paraplegia, tachycardia. Urine culture growing central back tear. EXAM: CT ANGIOGRAPHY CHEST WITH CONTRAST TECHNIQUE: Multidetector CT imaging of the chest was performed using the standard protocol during bolus administration of intravenous contrast. Multiplanar CT image reconstructions and MIPs were obtained  to evaluate the vascular anatomy. CONTRAST:  70 cc Isovue 370 COMPARISON:  CT abdomen from 06/25/2008 FINDINGS: Cardiovascular: Satisfactory opacification of the pulmonary arteries to the segmental level. No evidence of pulmonary embolism. Normal heart size. No pericardial effusion. Mediastinum/Nodes: No enlarged mediastinal, hilar, or axillary lymph nodes. Thyroid gland, trachea, and esophagus demonstrate no significant findings. Lungs/Pleura: Minimal bronchiectasis to the left lower lobe with associated linear scarring and/or platelike atelectasis. Tiny tree-in-bud densities consistent with infectious bronchiolitis in the right upper and lower lobes. A few tiny 5 mm or less pulmonary nodules are seen in the right upper and lower lobes possibly representing subpleural or perifissural lymph nodes. No pleural effusion or pneumothorax. Upper Abdomen: No acute abnormality.  Musculoskeletal:  Levoconvex scoliosis of the upper thoracic spine. Review of the MIP images confirms the above findings. IMPRESSION: 1. No acute pulmonary embolus. 2. Tiny tree-in-bud densities in the right upper and lower lobes consistent with bronchiolitis. 3. 5 mm or less pulmonary nodular densities are also seen within the right upper and lower lobes possibly representing subpleural and perifissural lymph nodes. No follow-up needed if patient is low-risk (and has no known or suspected primary neoplasm). Non-contrast chest CT can be considered in 12 months if patient is high-risk. This recommendation follows the consensus statement: Guidelines for Management of Incidental Pulmonary Nodules Detected on CT Images: From the Fleischner Society 2017; Radiology 2017; 284:228-243. Electronically Signed   By: Ashley Royalty M.D.   On: 04/07/2016 20:13   US Transvaginal Non-ob  Result Date: 04/05/2016 CLINICAL DATA:  Irregular menstrual. EXAM: TRANSABDOMINAL AND TRANSVAGINAL ULTRASOUND OF PELVIS TECHNIQUE: Both transabdominal and transvaginal ultrasound examinations of the pelvis were performed. Transabdominal technique was performed for global imaging of the pelvis including uterus, ovaries, adnexal regions, and pelvic cul-de-sac. It was necessary to proceed with endovaginal exam following the transabdominal exam to visualize the uterus and ovaries and endometrium. COMPARISON:  MRI pelvis 03/07/2016 FINDINGS: Uterus Measurements: 7.9 x 3.4 x 3.8 cm. No fibroids or other mass visualized. Endometrium Thickness: 2.9 mm.  No focal abnormality visualized. Right ovary Measurements: 2.2 x 1.4 x 1.6 cm. Normal appearance/no adnexal mass. Left ovary Not visualized Other findings No abnormal free fluid.  Foley catheter is present in the bladder IMPRESSION: 1. Nonvisualization of the left ovary 2. Otherwise negative pelvic ultrasound Electronically Signed   By: Donavan Foil M.D.   On: 04/05/2016 14:44   US Pelvis  Complete  Result Date: 04/05/2016 CLINICAL DATA:  Irregular menstrual. EXAM: TRANSABDOMINAL AND TRANSVAGINAL ULTRASOUND OF PELVIS TECHNIQUE: Both transabdominal and transvaginal ultrasound examinations of the pelvis were performed. Transabdominal technique was performed for global imaging of the pelvis including uterus, ovaries, adnexal regions, and pelvic cul-de-sac. It was necessary to proceed with endovaginal exam following the transabdominal exam to visualize the uterus and ovaries and endometrium. COMPARISON:  MRI pelvis 03/07/2016 FINDINGS: Uterus Measurements: 7.9 x 3.4 x 3.8 cm. No fibroids or other mass visualized. Endometrium Thickness: 2.9 mm.  No focal abnormality visualized. Right ovary Measurements: 2.2 x 1.4 x 1.6 cm. Normal appearance/no adnexal mass. Left ovary Not visualized Other findings No abnormal free fluid.  Foley catheter is present in the bladder IMPRESSION: 1. Nonvisualization of the left ovary 2. Otherwise negative pelvic ultrasound Electronically Signed   By: Donavan Foil M.D.   On: 04/05/2016 14:44   US Renal  Result Date: 04/05/2016 CLINICAL DATA:  41 y/o F; urinary tract infection. Traumatic catheterization. EXAM: RENAL / URINARY TRACT ULTRASOUND COMPLETE COMPARISON:  None. FINDINGS: Right Kidney:  Length: 11.8 cm. Echogenicity is within normal limits. No mass identified. Mild hydronephrosis. Left Kidney: Length: 12.2 cm. Echogenicity within normal limits. No mass or hydronephrosis visualized. Bladder: Foley catheter in situ. IMPRESSION: Mild right hydronephrosis. Electronically Signed   By: Kristine Garbe M.D.   On: 04/05/2016 14:40   Ct Biopsy  Result Date: 03/12/2016 INDICATION: Leukopenia of uncertain etiology. Please perform CT-guided bone marrow biopsy for tissue diagnostic purposes. EXAM: CT-GUIDED BONE MARROW BIOPSY AND ASPIRATION MEDICATIONS: None ANESTHESIA/SEDATION: Fentanyl 50 mcg IV; Versed 1 mg IV Sedation Time: 11 minutes; The patient was continuously  monitored during the procedure by the interventional radiology nurse under my direct supervision. COMPLICATIONS: None immediate. PROCEDURE: Informed consent was obtained from the patient following an explanation of the procedure, risks, benefits and alternatives. The patient understands, agrees and consents for the procedure. All questions were addressed. A time out was performed prior to the initiation of the procedure. The patient was positioned prone and non-contrast localization CT was performed of the pelvis to demonstrate the iliac marrow spaces. The operative site was prepped and draped in the usual sterile fashion. Under sterile conditions and local anesthesia, a 22 gauge spinal needle was utilized for procedural planning. Next, an 11 gauge coaxial bone biopsy needle was advanced into the left iliac marrow space. Needle position was confirmed with CT imaging. Initially, bone marrow aspiration was performed. Next, a bone marrow biopsy was obtained with the 11 gauge outer bone marrow device. The 11 gauge coaxial bone biopsy needle was re-advanced into a slightly different location within the left iliac marrow space, positioning was confirmed and an additional bone marrow biopsy was obtained. Samples were prepared with the cytotechnologist and deemed adequate. No, additional sample was set aside for Gram stain and culture analysis. The needle was removed intact. Hemostasis was obtained with compression and a dressing was placed. The patient tolerated the procedure well without immediate post procedural complication. IMPRESSION: Successful CT guided left iliac bone marrow aspiration and core biopsy. Note, an additional sample was set aside for Gram stain and culture analysis. Electronically Signed   By: Sandi Mariscal M.D.   On: 03/12/2016 09:49   Dg Chest Port 1 View  Result Date: 04/06/2016 CLINICAL DATA:  Fever. EXAM: PORTABLE CHEST 1 VIEW COMPARISON:  03/07/2016 FINDINGS: Normal heart size and mediastinal  contours. No acute infiltrate or edema. Nipple shadows present. No effusion or pneumothorax. No acute osseous findings. IMPRESSION: No evidence of pneumonia. Electronically Signed   By: Monte Fantasia M.D.   On: 04/06/2016 09:24    none   Subjective: Feeling well today.  Denies lightheadedness, shortness of breath, nausea, abdominal or flank pains.    Discharge Exam: Vitals:   04/07/16 2107 04/08/16 0506  BP: 99/68 99/63  Pulse: (!) 122 97  Resp: 19 19  Temp: 99.8 F (37.7 C) 97.3 F (36.3 C)   Vitals:   04/07/16 1611 04/07/16 1651 04/07/16 2107 04/08/16 0506  BP: (!) '93/58 95/67 99/68 ' 99/63  Pulse: (!) 103 (!) 110 (!) 122 97  Resp: (!) '24  19 19  ' Temp: 99.7 F (37.6 C)  99.8 F (37.7 C) 97.3 F (36.3 C)  TempSrc: Oral  Oral Oral  SpO2:   94% 93%    General exam:  Adult female, cachectic and frail appearing.  No acute distress.  HEENT:  NCAT, MMM Respiratory system: Clear to auscultation bilaterally Cardiovascular system:  Tachycardic to the 120s on telemetry and on exam.  No murmurs, rubs, gallops or clicks.  Warm extremities Gastrointestinal system: Normal active bowel sounds, soft, rectus abdominus muscles are unable to be relaxed by patient but she denies abdominal pain on exam.  Abdomen is soft in areas surrounding rectus muscle.   MSK:  Decreased tone and bulk, no lower extremity edema Neuro:  Diffusely weak.   Psych:  Tangiential, limited medical insight    The results of significant diagnostics from this hospitalization (including imaging, microbiology, ancillary and laboratory) are listed below for reference.     Microbiology: Recent Results (from the past 240 hour(s))  Urine culture     Status: Abnormal   Collection Time: 04/05/16 10:56 AM  Result Value Ref Range Status   Specimen Description URINE, CLEAN CATCH  Final   Special Requests NONE  Final   Culture >=100,000 COLONIES/mL CITROBACTER FREUNDII (A)  Final   Report Status 04/07/2016 FINAL  Final    Organism ID, Bacteria CITROBACTER FREUNDII (A)  Final      Susceptibility   Citrobacter freundii - MIC*    CEFAZOLIN >=64 RESISTANT Resistant     CEFTRIAXONE <=1 SENSITIVE Sensitive     CIPROFLOXACIN <=0.25 SENSITIVE Sensitive     GENTAMICIN >=16 RESISTANT Resistant     IMIPENEM <=0.25 SENSITIVE Sensitive     NITROFURANTOIN <=16 SENSITIVE Sensitive     TRIMETH/SULFA >=320 RESISTANT Resistant     PIP/TAZO <=4 SENSITIVE Sensitive     * >=100,000 COLONIES/mL CITROBACTER FREUNDII  Culture, blood (Routine X 2) w Reflex to ID Panel     Status: None (Preliminary result)   Collection Time: 04/05/16  1:35 PM  Result Value Ref Range Status   Specimen Description BLOOD RIGHT ANTECUBITAL  Final   Special Requests IN PEDIATRIC BOTTLE 3CC  Final   Culture   Final    NO GROWTH 2 DAYS Performed at [facility], [address] [place], [place] [address]    Report Status PENDING  Incomplete  Culture, blood (Routine X 2) w Reflex to ID Panel     Status: None (Preliminary result)   Collection Time: 04/05/16  2:36 PM  Result Value Ref Range Status   Specimen Description BLOOD RIGHT ANTECUBITAL  Final   Special Requests BOTTLES DRAWN AEROBIC AND ANAEROBIC 5 ML  Final   Culture   Final    NO GROWTH 2 DAYS Performed at [facility], [place] [address] [place], [place] [address]    Report Status PENDING  Incomplete  MRSA PCR Screening     Status: None   Collection Time: 04/05/16 11:01 PM  Result Value Ref Range Status   MRSA by PCR NEGATIVE NEGATIVE Final    Comment:        The GeneXpert MRSA Assay (FDA approved for NASAL specimens only), is one component of a comprehensive MRSA colonization surveillance program. It is not intended to diagnose MRSA infection nor to guide or monitor treatment for MRSA infections.      Labs: BNP (last 3 results) No results for input(s): BNP in the last 8760 hours. Basic Metabolic Panel:  Recent Labs Lab 04/05/16 1002 04/06/16 0713  04/07/16 0823 04/08/16 0710  NA 141 143 142 139  K 3.7 3.2* 4.2 3.8  CL 100* 112* 109 104  CO2 '31 25 25 28  ' GLUCOSE 92 89 82 87  BUN '10 9 6 6  ' CREATININE 0.58 0.40* 0.38* 0.34*  CALCIUM 9.9 8.6* 8.8* 8.8*  MG 1.7  --   --   --    Liver Function Tests:  Recent Labs Lab 04/06/16 0713  AST 27  ALT 55*  ALKPHOS 75  BILITOT 0.5  PROT 5.4*  ALBUMIN 2.1*   No results for input(s): LIPASE, AMYLASE in the last 168 hours. No results for input(s): AMMONIA in the last 168 hours. CBC:  Recent Labs Lab 04/05/16 1002 04/06/16 0713 04/06/16 0915 04/07/16 0823 04/07/16 1847 04/08/16 0710  WBC 10.9* 8.4 9.0 7.9  --  6.3  NEUTROABS 9.0*  --   --   --   --   --   HGB 10.3* 6.7* 8.2* 7.0* 9.3* 8.4*  HCT 33.0* 21.6* 26.7* 22.8* 28.7* 27.1*  MCV 89.7 90.0 91.1 90.5  --  84.4  PLT 632* 514* 532* 517*  --  526*   Cardiac Enzymes: No results for input(s): CKTOTAL, CKMB, CKMBINDEX, TROPONINI in the last 168 hours. BNP: Invalid input(s): POCBNP CBG: No results for input(s): GLUCAP in the last 168 hours. D-Dimer  Recent Labs  04/07/16 1045  DDIMER 3.67*   Hgb A1c No results for input(s): HGBA1C in the last 72 hours. Lipid Profile No results for input(s): CHOL, HDL, LDLCALC, TRIG, CHOLHDL, LDLDIRECT in the last 72 hours. Thyroid function studies  Recent Labs  04/07/16 1045  TSH 1.741   Anemia work up  Recent Labs  04/07/16 1045  VITAMINB12 423  FOLATE 10.1  FERRITIN 276  TIBC 174*  IRON 14*  RETICCTPCT 1.1   Urinalysis    Component Value Date/Time   COLORURINE YELLOW 04/05/2016 1056   APPEARANCEUR TURBID (A) 04/05/2016 1056   LABSPEC 1.015 04/05/2016 1056   PHURINE 5.0 04/05/2016 1056   GLUCOSEU NEGATIVE 04/05/2016 1056   HGBUR NEGATIVE 04/05/2016 1056   [place] 04/05/2016 1056   KETONESUR NEGATIVE 04/05/2016 1056   PROTEINUR 100 (A) 04/05/2016 1056   UROBILINOGEN 0.2 06/25/2008 1551   NITRITE POSITIVE (A) 04/05/2016 1056   LEUKOCYTESUR  MODERATE (A) 04/05/2016 1056   Sepsis Labs Invalid input(s): PROCALCITONIN,  WBC,  LACTICIDVEN   Time coordinating discharge: Over 30 minutes  SIGNED:   Janece Canterbury, MD  [facility] 04/08/2016, 12:39 PM Pager   If 7PM-7AM, please contact night-coverage [email or web address] Password [email or web address]

## 2016-04-09 ENCOUNTER — Non-Acute Institutional Stay (SKILLED_NURSING_FACILITY): Payer: Medicare Other | Admitting: Adult Health

## 2016-04-09 ENCOUNTER — Encounter: Payer: Self-pay | Admitting: Adult Health

## 2016-04-09 DIAGNOSIS — N319 Neuromuscular dysfunction of bladder, unspecified: Secondary | ICD-10-CM | POA: Insufficient documentation

## 2016-04-09 DIAGNOSIS — Z9289 Personal history of other medical treatment: Secondary | ICD-10-CM | POA: Diagnosis not present

## 2016-04-09 DIAGNOSIS — D508 Other iron deficiency anemias: Secondary | ICD-10-CM | POA: Diagnosis not present

## 2016-04-09 DIAGNOSIS — L89153 Pressure ulcer of sacral region, stage 3: Secondary | ICD-10-CM | POA: Diagnosis not present

## 2016-04-09 DIAGNOSIS — Z978 Presence of other specified devices: Secondary | ICD-10-CM | POA: Insufficient documentation

## 2016-04-09 DIAGNOSIS — G35 Multiple sclerosis: Secondary | ICD-10-CM | POA: Diagnosis not present

## 2016-04-09 DIAGNOSIS — G8220 Paraplegia, unspecified: Secondary | ICD-10-CM | POA: Diagnosis not present

## 2016-04-09 DIAGNOSIS — D709 Neutropenia, unspecified: Secondary | ICD-10-CM

## 2016-04-09 DIAGNOSIS — N10 Acute tubulo-interstitial nephritis: Secondary | ICD-10-CM

## 2016-04-09 DIAGNOSIS — Z960 Presence of urogenital implants: Secondary | ICD-10-CM

## 2016-04-09 DIAGNOSIS — R1311 Dysphagia, oral phase: Secondary | ICD-10-CM

## 2016-04-09 DIAGNOSIS — E43 Unspecified severe protein-calorie malnutrition: Secondary | ICD-10-CM

## 2016-04-09 NOTE — Progress Notes (Signed)
Location:   [place] Room Number: [place] of Service:  SNF (31)   CODE STATUS: Full Code  No Known Allergies  Chief Complaint  Patient presents with  . Hospitalization Follow-up    Hospital Follow up    HPI:  She has been hospitalized for uti; for which she will complete abt on 04-12-16. She had a sudden decline in her hgb without any obvious signs of bleeding and did receive a blood transfusion. She will need follow up for this. Her goal at this time is to return back home as soon as she is able to do so. I am not certain at this time, if this goal is obtainable.    Past Medical History:  Diagnosis Date  . Buttock wound 03/03/2016  . MS (multiple sclerosis) ([place])   . Pneumonia 02/2016  . Protein calorie malnutrition ([place])   . Severe sepsis ([place]) 03/03/2016  . UTI (urinary tract infection) 02/2016    Past Surgical History:  Procedure Laterality Date  . NO PAST SURGERIES      Social History   Social History  . Marital status: Single    Spouse name: N/A  . Number of children: N/A  . Years of education: N/A   Occupational History  . Not on file.   Social History Main Topics  . Smoking status: Former Smoker    Packs/day: 1.00    Types: Cigarettes    Quit date: 02/01/2010  . Smokeless tobacco: Never Used  . Alcohol use No  . Drug use: No  . Sexual activity: Not on file   Other Topics Concern  . Not on file   Social History Narrative  . No narrative on file   Family History  Problem Relation Age of Onset  . Mental illness Sister       VITAL SIGNS BP 99/64   Pulse 97   Temp (!) 96.3 F (35.7 C)   Resp 20   Ht '5\' 9"'  (1.753 m)   Wt 98 lb 1.6 oz (44.5 kg)   SpO2 95%   BMI 14.49 kg/m   Patient's Medications  New Prescriptions   No medications on file  Previous Medications   ACETAMINOPHEN (TYLENOL) 325 MG TABLET    Take 2 tablets (650 mg total) by mouth every 6 (six) hours as needed for mild pain (or Fever >/= 101).   AMINO  ACIDS-PROTEIN HYDROLYS (FEEDING SUPPLEMENT, PRO-STAT SUGAR FREE 64,) LIQD    Take 30 mLs by mouth 3 (three) times daily with meals.   BISACODYL (DULCOLAX) 5 MG EC TABLET    Take 1 tablet (5 mg total) by mouth daily as needed for moderate constipation.   CIPROFLOXACIN (CIPRO) 500 MG TABLET    Take 1 tablet (500 mg total) by mouth 2 (two) times daily.   FERROUS SULFATE 325 (65 FE) MG TABLET    Take 1 tablet (325 mg total) by mouth 2 (two) times daily with a meal.   MULTIPLE VITAMIN (MULTIVITAMIN WITH MINERALS) TABS TABLET    Take 1 tablet by mouth daily.   MULTIPLE VITAMINS-MINERALS (DECUBI-VITE) CAPS    Take 1 capsule by mouth daily.   POLYETHYLENE GLYCOL (MIRALAX / GLYCOLAX) PACKET    Take 17 g by mouth daily.  Modified Medications   No medications on file  Discontinued Medications   FEEDING SUPPLEMENT, ENSURE ENLIVE, (ENSURE ENLIVE) LIQD    Take 237 mLs by mouth 2 (two) times daily between meals.   MALTODEXTRIN-XANTHAN GUM (RESOURCE THICKENUP  CLEAR) POWD    For honey thick liquid     SIGNIFICANT DIAGNOSTIC EXAMS   03-04-16: swallow study: recommend diet of dysphagia 3 solids, honey thick liquids (no straw), meds whole in puree, and cough/clear throat after every few bites/sips  03-07-16: chest x-ray: Increase left lung opacity concerning for worsening pneumonia or atelectasis with associated pleural effusion.   03-07-16: mri of pelvis: 1. Cellulitis and diffuse myofasciitis involving the pelvic and hip musculature. There also or rim enhancing fluid collections in the gluteus maximus muscles bilaterally concerning for pyomyositis. 2. No findings to suggest septic arthritis or osteomyelitis. 3. Shallow sacral decubitus ulcer but no underlying sacral osteomyelitis. 4. Markedly distended bladder.  03-07-16: mri of lumbar: 1. Negative for lumbar spine infection or impingement. 2. There is non organized edema in the subcutaneous fat. Negative for abscess. 3. Urinary retention causing bilateral  hydroureteronephrosis, also seen by CT in 2010.   03-12-16: bone marrow biopsy: Successful CT guided left iliac bone marrow aspiration and core biopsy. Note, an additional sample was set aside for Gram stain and culture analysis.  04-05-16: transvaginal and pelvic ultrasound: 1. Nonvisualization of the left ovary   2. Otherwise negative pelvic ultrasound  04-05-16: renal ultrasound: Mild right hydronephrosis.  04-05-16: chest x-ray: No evidence of pneumonia.  04-07-16: ct angio of chest: 1. No acute pulmonary embolus. 2. Tiny tree-in-bud densities in the right upper and lower lobes consistent with bronchiolitis. 3. 5 mm or less pulmonary nodular densities are also seen within the right upper and lower lobes possibly representing subpleural and perifissural lymph nodes. No follow-up needed if patient is low-risk (and has no known or suspected primary neoplasm). Non-contrast chest CT can be considered in 12 months if patient is high-risk.      LABS REVIEWED:   03-02-16: wbc 13.2; hgb 11.7; hct 37.8; mcv 92.0; plt 526; glucose 163; bun 12; creat 0.77; k+ 3.7; na++ 141; ast 113; alt 55; alk phos 118; total bili 1.7; albumin 2.2  03-05-16: wbc 2.9; hgb 8.8; hct 29.4; mcv 93.3; plt 360; glucose 110; bun 13; creat 0.87; k+ 3.5 ;na++ 158; mag 2.3 03-07-16: wbc 1.8; hgb 9.3; hct 31.0; mcv 92.3; plt 299; glucose 112; bun 9; creat 0.99; k+ 3.2 ;na++ 143; mag 2.0; urine culture: e-coli; blood cultures: neg; HIV nr 03-12-16: wbc 2.6; hgb 8.7; hct 29.0; mcv 89.8; plt 392; glucose 104; bun <5; creat 0.47; k+ 3.6; na++ 141 03-15-16: wbc 24.2; hgb 8.7; hct 28.9; mcv 90.6; plt 496; glucose 88; bun 6; creat 0.41; k+ 3.9; na++ 144 mag 1.8  04-05-16: wbc 10.9; hgb 10.3; hct 33.0; mcv 89.7; plt 632; glucose 92; bun 10; creat 0.58; k+ 3.7; na++ 141; mag 1.7; urine culture: citrobacter freundii:cipro  04-06-16: wbc 9.0; hgb 8.2; hct 26.7; mcv 91.1; plt 532; glucose 89; bun 9; creat 0.41; k+ 3.2; na++ 143; liver normal albumin  2.1 04-07-16: wbc 7.9 ;hgb 7.0; hct 22.8; mcv 90.5; plt 517;glucose 82; bun 6; creat 0.38; k+ 4.2; na++ 142; iron 14; tibc 174; ferritin 276; folate 10.1; vit B 12: 423; tsh 1.741; d-dimer 3.67  04-08-16: wbc 6.3; hgb 8.4; hct 27.1; mcv 84.4 ;plt 526; glucose 87; bun 6; creat 0.34; k+ 3.8; na++ 139      Review of Systems  Constitutional: Negative for malaise/fatigue.  Respiratory: Negative for cough and shortness of breath.   Cardiovascular: Negative for chest pain, palpitations and leg swelling.  Gastrointestinal: Negative for abdominal pain, constipation and heartburn.  Genitourinary:  Has foley   Musculoskeletal: has generalized muscle aches; tylenol in am relieves .  Skin: Negative.        Has skin wounds   Neurological: Negative for dizziness.  Psychiatric/Behavioral: The patient is not nervous/anxious.      Physical Exam  Constitutional: She is oriented to person, place, and time. No distress.  Frail; malnurished   Eyes: Conjunctivae are normal.  Neck: Neck supple. No JVD present. No thyromegaly present.  Cardiovascular: Normal rate, regular rhythm and intact distal pulses.   Respiratory: Effort normal and breath sounds normal. No respiratory distress. She has no wheezes.  GI: Soft. Bowel sounds are normal. She exhibits no distension. There is no tenderness.  Genitourinary:  Genitourinary Comments: Has foley   Musculoskeletal: She exhibits no edema.  Able to move upper extremities Bilateral lower extremities with splints in place    Lymphadenopathy:    She has no cervical adenopathy.  Neurological: She is alert and oriented to person, place, and time.  Skin: Skin is warm and dry. She is not diaphoretic.  Has stage II-III sacral wounds without signs of infection present.   Psychiatric: She has a normal mood and affect.     ASSESSMENT/ PLAN:  1. MS: is followed by neurology. Has bilateral lower extremity splints in place. She is on Rituxan every 6 months at [facility]  neurology; she is due July 06, 2016.   2.  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 Myofascitis: right gluteal: will complete doxycycline will monitor   3. Sacral decubitus stage III: will continue treatment per facility protocol; will be followed by wound Dr.  4. Neurogenic bladder; has long term foley will need to see urology in one month   5. Protein calorie malnutrition: severe: albumin is 2.1 will continue supplements per facility protocol and will continue prostat 30 cc three times daily   6. Constipation: will continue miralax daily   7. Neutropenia: has had a bone biopsy; will follow up with oncology  8. Physical deconditioning: will continue therapy as directed to improve upon her strength; balance; independence with her adls  9. Dysphagia: no further signs of aspiration; has been treated for aspiation pneumonia; will continue honey thick liquids  10. Anemia:  Likely related to chronic disease and iron deficiency: is status post transfusion 04/2016: hgb 8.4; will have her follow up with oncology and will monitor her status; will get fbot   11.  UTI: will complete cipro and will monitor   Will check cbc bmp next week     Time spent with patient 50   minutes >50% time spent counseling; reviewing medical record; tests; labs; and developing future plan of care      Ok Edwards NP [facility]  Contact [phone] Monday through Friday 8am- 5pm  After hours call [phone]

## 2016-04-10 LAB — CULTURE, BLOOD (ROUTINE X 2)
Culture: NO GROWTH
Culture: NO GROWTH

## 2016-04-19 DIAGNOSIS — N360 Urethral fistula: Secondary | ICD-10-CM | POA: Diagnosis not present

## 2016-04-19 DIAGNOSIS — N318 Other neuromuscular dysfunction of bladder: Secondary | ICD-10-CM | POA: Diagnosis not present

## 2016-04-27 ENCOUNTER — Encounter: Payer: Self-pay | Admitting: Adult Health

## 2016-04-27 ENCOUNTER — Non-Acute Institutional Stay (SKILLED_NURSING_FACILITY): Payer: Medicare Other | Admitting: Adult Health

## 2016-04-27 DIAGNOSIS — R000 Tachycardia, unspecified: Secondary | ICD-10-CM

## 2016-04-27 NOTE — Progress Notes (Signed)
Location:   [place] Room Number: [place] of Service:  SNF (31)   CODE STATUS: Full Code  No Known Allergies  Chief Complaint  Patient presents with  . Acute Visit    Tachycardia    HPI:  She is tachycardic. Staff reports that she has lost weight from 98 pounds on 04-09-16 to her current weight of 83 pounds. There are no reports of any change in her appetite. She tells me that she is feeling good.    Past Medical History:  Diagnosis Date  . Buttock wound 03/03/2016  . MS (multiple sclerosis) ([place])   . Pneumonia 02/2016  . Protein calorie malnutrition ([place])   . Severe sepsis ([place]) 03/03/2016  . UTI (urinary tract infection) 02/2016    Past Surgical History:  Procedure Laterality Date  . NO PAST SURGERIES      Social History   Social History  . Marital status: Single    Spouse name: N/A  . Number of children: N/A  . Years of education: N/A   Occupational History  . Not on file.   Social History Main Topics  . Smoking status: Former Smoker    Packs/day: 1.00    Types: Cigarettes    Quit date: 02/01/2010  . Smokeless tobacco: Never Used  . Alcohol use No  . Drug use: No  . Sexual activity: Not on file   Other Topics Concern  . Not on file   Social History Narrative  . No narrative on file   Family History  Problem Relation Age of Onset  . Mental illness Sister       VITAL SIGNS BP 99/64   Pulse 97   Temp (!) 96.3 F (35.7 C)   Resp 20   Ht '5\' 9"'  (1.753 m)   Wt 83 lb 11.2 oz (38 kg)   SpO2 95%   BMI 12.36 kg/m   Patient's Medications  New Prescriptions   No medications on file  Previous Medications   ACETAMINOPHEN (TYLENOL) 325 MG TABLET    Give 650 mg by mouth in the morning for chronic pain and 650 mg by mouth every 6 hours as needed for mild pain   AMINO ACIDS-PROTEIN HYDROLYS (FEEDING SUPPLEMENT, PRO-STAT SUGAR FREE 64,) LIQD    Take 30 mLs by mouth 3 (three) times daily with meals.   BISACODYL (DULCOLAX) 5 MG EC  TABLET    Take 1 tablet (5 mg total) by mouth daily as needed for moderate constipation.   FERROUS SULFATE 325 (65 FE) MG TABLET    Take 1 tablet (325 mg total) by mouth 2 (two) times daily with a meal.   MULTIPLE VITAMINS-MINERALS (DECUBI-VITE) CAPS    Take 1 capsule by mouth daily.   NUTRITIONAL SUPPLEMENTS (NUTRITIONAL SUPPLEMENT PO)    Take by mouth. House Supplement - give 237 ml between meals / Protien calorie malnutrition Nutritional treat with meals every day   POLYETHYLENE GLYCOL (MIRALAX / GLYCOLAX) PACKET    Take 17 g by mouth daily.  Modified Medications   No medications on file  Discontinued Medications   ACETAMINOPHEN (TYLENOL) 325 MG TABLET    Take 2 tablets (650 mg total) by mouth every 6 (six) hours as needed for mild pain (or Fever >/= 101).   CIPROFLOXACIN (CIPRO) 500 MG TABLET    Take 1 tablet (500 mg total) by mouth 2 (two) times daily.   MULTIPLE VITAMIN (MULTIVITAMIN WITH MINERALS) TABS TABLET    Take 1 tablet by  mouth daily.     SIGNIFICANT DIAGNOSTIC EXAMS  03-04-16: swallow study: recommend diet of dysphagia 3 solids, honey thick liquids (no straw), meds whole in puree, and cough/clear throat after every few bites/sips  03-07-16: chest x-ray: Increase left lung opacity concerning for worsening pneumonia or atelectasis with associated pleural effusion.   03-07-16: mri of pelvis: 1. Cellulitis and diffuse myofasciitis involving the pelvic and hip musculature. There also or rim enhancing fluid collections in the gluteus maximus muscles bilaterally concerning for pyomyositis. 2. No findings to suggest septic arthritis or osteomyelitis. 3. Shallow sacral decubitus ulcer but no underlying sacral osteomyelitis. 4. Markedly distended bladder.  03-07-16: mri of lumbar: 1. Negative for lumbar spine infection or impingement. 2. There is non organized edema in the subcutaneous fat. Negative for abscess. 3. Urinary retention causing bilateral hydroureteronephrosis, also seen by CT in  2010.   03-12-16: bone marrow biopsy: Successful CT guided left iliac bone marrow aspiration and core biopsy. Note, an additional sample was set aside for Gram stain and culture analysis.  04-05-16: transvaginal and pelvic ultrasound: 1. Nonvisualization of the left ovary   2. Otherwise negative pelvic ultrasound  04-05-16: renal ultrasound: Mild right hydronephrosis.  04-05-16: chest x-ray: No evidence of pneumonia.  04-07-16: ct angio of chest: 1. No acute pulmonary embolus. 2. Tiny tree-in-bud densities in the right upper and lower lobes consistent with bronchiolitis. 3. 5 mm or less pulmonary nodular densities are also seen within the right upper and lower lobes possibly representing subpleural and perifissural lymph nodes. No follow-up needed if patient is low-risk (and has no known or suspected primary neoplasm). Non-contrast chest CT can be considered in 12 months if patient is high-risk.      LABS REVIEWED:   03-02-16: wbc 13.2; hgb 11.7; hct 37.8; mcv 92.0; plt 526; glucose 163; bun 12; creat 0.77; k+ 3.7; na++ 141; ast 113; alt 55; alk phos 118; total bili 1.7; albumin 2.2  03-05-16: wbc 2.9; hgb 8.8; hct 29.4; mcv 93.3; plt 360; glucose 110; bun 13; creat 0.87; k+ 3.5 ;na++ 158; mag 2.3 03-07-16: wbc 1.8; hgb 9.3; hct 31.0; mcv 92.3; plt 299; glucose 112; bun 9; creat 0.99; k+ 3.2 ;na++ 143; mag 2.0; urine culture: e-coli; blood cultures: neg; HIV nr 03-12-16: wbc 2.6; hgb 8.7; hct 29.0; mcv 89.8; plt 392; glucose 104; bun <5; creat 0.47; k+ 3.6; na++ 141 03-15-16: wbc 24.2; hgb 8.7; hct 28.9; mcv 90.6; plt 496; glucose 88; bun 6; creat 0.41; k+ 3.9; na++ 144 mag 1.8  04-05-16: wbc 10.9; hgb 10.3; hct 33.0; mcv 89.7; plt 632; glucose 92; bun 10; creat 0.58; k+ 3.7; na++ 141; mag 1.7; urine culture: citrobacter freundii:cipro  04-06-16: wbc 9.0; hgb 8.2; hct 26.7; mcv 91.1; plt 532; glucose 89; bun 9; creat 0.41; k+ 3.2; na++ 143; liver normal albumin 2.1 04-07-16: wbc 7.9 ;hgb 7.0; hct 22.8; mcv 90.5;  plt 517;glucose 82; bun 6; creat 0.38; k+ 4.2; na++ 142; iron 14; tibc 174; ferritin 276; folate 10.1; vit B 12: 423; tsh 1.741; d-dimer 3.67  04-08-16: wbc 6.3; hgb 8.4; hct 27.1; mcv 84.4 ;plt 526; glucose 87; bun 6; creat 0.34; k+ 3.8; na++ 139      Review of Systems  Constitutional: Negative for malaise/fatigue.  Respiratory: Negative for cough and shortness of breath.   Cardiovascular: Negative for chest pain, palpitations and leg swelling.  Gastrointestinal: Negative for abdominal pain, constipation and heartburn.  Genitourinary:       Has foley   Musculoskeletal: has  generalized muscle aches; tylenol in am relieves .  Skin: Negative.        Has skin wounds   Neurological: Negative for dizziness.  Psychiatric/Behavioral: The patient is not nervous/anxious.      Physical Exam  Constitutional: She is oriented to person, place, and time. No distress.  Frail; malnurished   Eyes: Conjunctivae are normal.  Neck: Neck supple. No JVD present. No thyromegaly present.  Cardiovascular: , regular rhythm and intact distal pulses.  is tachycardic  Respiratory: Effort normal and breath sounds normal. No respiratory distress. She has no wheezes.  GI: Soft. Bowel sounds are normal. She exhibits no distension. There is no tenderness.  Genitourinary:  Genitourinary Comments: Has foley   Musculoskeletal: She exhibits no edema.  Able to move upper extremities Bilateral lower extremities with splints in place    Lymphadenopathy:    She has no cervical adenopathy.  Neurological: She is alert and oriented to person, place, and time.  Skin: Skin is warm and dry. She is not diaphoretic.    Psychiatric: She has a normal mood and affect.     ASSESSMENT/ PLAN:  1. Tachycardia: will begin lopressor 12.5 mg twice daily will get an echo to look at her cardiac function. Will have staff monitor weight daily for the accuracy of her weight; will monitor   Will check cbc; cmp    MD is aware of  resident's narcotic use and is in agreement with current plan of care. We will attempt to wean resident as apropriate     Ok Edwards NP [facility]  Contact [phone] Monday through Friday 8am- 5pm  After hours call [phone]

## 2016-05-02 DIAGNOSIS — N368 Other specified disorders of urethra: Secondary | ICD-10-CM | POA: Diagnosis present

## 2016-05-02 DIAGNOSIS — L89153 Pressure ulcer of sacral region, stage 3: Secondary | ICD-10-CM | POA: Diagnosis not present

## 2016-05-02 DIAGNOSIS — M791 Myalgia: Secondary | ICD-10-CM | POA: Diagnosis not present

## 2016-05-02 DIAGNOSIS — R634 Abnormal weight loss: Secondary | ICD-10-CM | POA: Diagnosis not present

## 2016-05-02 DIAGNOSIS — E538 Deficiency of other specified B group vitamins: Secondary | ICD-10-CM | POA: Diagnosis not present

## 2016-05-02 DIAGNOSIS — R0602 Shortness of breath: Secondary | ICD-10-CM | POA: Diagnosis not present

## 2016-05-02 DIAGNOSIS — K5900 Constipation, unspecified: Secondary | ICD-10-CM | POA: Diagnosis present

## 2016-05-02 DIAGNOSIS — R000 Tachycardia, unspecified: Secondary | ICD-10-CM | POA: Diagnosis present

## 2016-05-02 DIAGNOSIS — L89150 Pressure ulcer of sacral region, unstageable: Secondary | ICD-10-CM | POA: Diagnosis not present

## 2016-05-02 DIAGNOSIS — J690 Pneumonitis due to inhalation of food and vomit: Secondary | ICD-10-CM | POA: Diagnosis not present

## 2016-05-02 DIAGNOSIS — L89220 Pressure ulcer of left hip, unstageable: Secondary | ICD-10-CM | POA: Diagnosis present

## 2016-05-02 DIAGNOSIS — E43 Unspecified severe protein-calorie malnutrition: Secondary | ICD-10-CM | POA: Diagnosis present

## 2016-05-02 DIAGNOSIS — Z993 Dependence on wheelchair: Secondary | ICD-10-CM | POA: Diagnosis not present

## 2016-05-02 DIAGNOSIS — L8945 Pressure ulcer of contiguous site of back, buttock and hip, unstageable: Secondary | ICD-10-CM | POA: Diagnosis present

## 2016-05-02 DIAGNOSIS — B964 Proteus (mirabilis) (morganii) as the cause of diseases classified elsewhere: Secondary | ICD-10-CM | POA: Diagnosis present

## 2016-05-02 DIAGNOSIS — R945 Abnormal results of liver function studies: Secondary | ICD-10-CM | POA: Diagnosis not present

## 2016-05-02 DIAGNOSIS — Z888 Allergy status to other drugs, medicaments and biological substances status: Secondary | ICD-10-CM | POA: Diagnosis not present

## 2016-05-02 DIAGNOSIS — Z1639 Resistance to other specified antimicrobial drug: Secondary | ICD-10-CM | POA: Diagnosis present

## 2016-05-02 DIAGNOSIS — E870 Hyperosmolality and hypernatremia: Secondary | ICD-10-CM | POA: Diagnosis not present

## 2016-05-02 DIAGNOSIS — T83091A Other mechanical complication of indwelling urethral catheter, initial encounter: Secondary | ICD-10-CM | POA: Diagnosis not present

## 2016-05-02 DIAGNOSIS — L89009 Pressure ulcer of unspecified elbow, unspecified stage: Secondary | ICD-10-CM | POA: Diagnosis not present

## 2016-05-02 DIAGNOSIS — E876 Hypokalemia: Secondary | ICD-10-CM | POA: Diagnosis not present

## 2016-05-02 DIAGNOSIS — E860 Dehydration: Secondary | ICD-10-CM | POA: Diagnosis present

## 2016-05-02 DIAGNOSIS — G114 Hereditary spastic paraplegia: Secondary | ICD-10-CM | POA: Diagnosis not present

## 2016-05-02 DIAGNOSIS — L89894 Pressure ulcer of other site, stage 4: Secondary | ICD-10-CM | POA: Diagnosis present

## 2016-05-02 DIAGNOSIS — L89610 Pressure ulcer of right heel, unstageable: Secondary | ICD-10-CM | POA: Diagnosis present

## 2016-05-02 DIAGNOSIS — R52 Pain, unspecified: Secondary | ICD-10-CM | POA: Diagnosis not present

## 2016-05-02 DIAGNOSIS — R799 Abnormal finding of blood chemistry, unspecified: Secondary | ICD-10-CM | POA: Diagnosis not present

## 2016-05-02 DIAGNOSIS — E872 Acidosis: Secondary | ICD-10-CM | POA: Diagnosis present

## 2016-05-02 DIAGNOSIS — R100 Acute abdomen: Secondary | ICD-10-CM | POA: Diagnosis not present

## 2016-05-02 DIAGNOSIS — J189 Pneumonia, unspecified organism: Secondary | ICD-10-CM | POA: Diagnosis not present

## 2016-05-02 DIAGNOSIS — L89154 Pressure ulcer of sacral region, stage 4: Secondary | ICD-10-CM | POA: Diagnosis present

## 2016-05-02 DIAGNOSIS — G35 Multiple sclerosis: Secondary | ICD-10-CM | POA: Diagnosis present

## 2016-05-02 DIAGNOSIS — L89142 Pressure ulcer of left lower back, stage 2: Secondary | ICD-10-CM | POA: Diagnosis not present

## 2016-05-02 DIAGNOSIS — R9431 Abnormal electrocardiogram [ECG] [EKG]: Secondary | ICD-10-CM | POA: Diagnosis not present

## 2016-05-02 DIAGNOSIS — N319 Neuromuscular dysfunction of bladder, unspecified: Secondary | ICD-10-CM | POA: Diagnosis present

## 2016-05-02 DIAGNOSIS — D638 Anemia in other chronic diseases classified elsewhere: Secondary | ICD-10-CM | POA: Diagnosis present

## 2016-05-02 DIAGNOSIS — L89143 Pressure ulcer of left lower back, stage 3: Secondary | ICD-10-CM | POA: Diagnosis not present

## 2016-05-02 DIAGNOSIS — D709 Neutropenia, unspecified: Secondary | ICD-10-CM | POA: Diagnosis not present

## 2016-05-02 DIAGNOSIS — S31809A Unspecified open wound of unspecified buttock, initial encounter: Secondary | ICD-10-CM | POA: Diagnosis not present

## 2016-05-02 DIAGNOSIS — K6812 Psoas muscle abscess: Secondary | ICD-10-CM | POA: Diagnosis not present

## 2016-05-02 DIAGNOSIS — T83098A Other mechanical complication of other indwelling urethral catheter, initial encounter: Secondary | ICD-10-CM | POA: Diagnosis present

## 2016-05-02 DIAGNOSIS — R1310 Dysphagia, unspecified: Secondary | ICD-10-CM | POA: Diagnosis present

## 2016-05-02 DIAGNOSIS — D649 Anemia, unspecified: Secondary | ICD-10-CM | POA: Diagnosis not present

## 2016-05-02 DIAGNOSIS — R6520 Severe sepsis without septic shock: Secondary | ICD-10-CM | POA: Diagnosis not present

## 2016-05-02 DIAGNOSIS — D509 Iron deficiency anemia, unspecified: Secondary | ICD-10-CM | POA: Diagnosis not present

## 2016-05-02 DIAGNOSIS — L89890 Pressure ulcer of other site, unstageable: Secondary | ICD-10-CM | POA: Diagnosis not present

## 2016-05-02 DIAGNOSIS — R509 Fever, unspecified: Secondary | ICD-10-CM | POA: Diagnosis present

## 2016-05-02 DIAGNOSIS — Z87891 Personal history of nicotine dependence: Secondary | ICD-10-CM | POA: Diagnosis not present

## 2016-05-02 DIAGNOSIS — G8250 Quadriplegia, unspecified: Secondary | ICD-10-CM | POA: Diagnosis present

## 2016-05-02 DIAGNOSIS — I429 Cardiomyopathy, unspecified: Secondary | ICD-10-CM | POA: Diagnosis present

## 2016-05-02 DIAGNOSIS — R1312 Dysphagia, oropharyngeal phase: Secondary | ICD-10-CM | POA: Diagnosis not present

## 2016-05-02 DIAGNOSIS — A419 Sepsis, unspecified organism: Secondary | ICD-10-CM | POA: Diagnosis present

## 2016-05-02 DIAGNOSIS — E119 Type 2 diabetes mellitus without complications: Secondary | ICD-10-CM | POA: Diagnosis not present

## 2016-05-02 DIAGNOSIS — Y69 Unspecified misadventure during surgical and medical care: Secondary | ICD-10-CM | POA: Diagnosis not present

## 2016-05-02 DIAGNOSIS — G8220 Paraplegia, unspecified: Secondary | ICD-10-CM | POA: Diagnosis not present

## 2016-05-02 DIAGNOSIS — R630 Anorexia: Secondary | ICD-10-CM | POA: Diagnosis not present

## 2016-05-02 DIAGNOSIS — N390 Urinary tract infection, site not specified: Secondary | ICD-10-CM | POA: Diagnosis present

## 2016-05-02 DIAGNOSIS — Z79899 Other long term (current) drug therapy: Secondary | ICD-10-CM | POA: Diagnosis not present

## 2016-05-11 ENCOUNTER — Non-Acute Institutional Stay (SKILLED_NURSING_FACILITY): Payer: Medicare Other | Admitting: Adult Health

## 2016-05-11 ENCOUNTER — Encounter: Payer: Self-pay | Admitting: Adult Health

## 2016-05-11 DIAGNOSIS — D638 Anemia in other chronic diseases classified elsewhere: Secondary | ICD-10-CM

## 2016-05-11 DIAGNOSIS — E43 Unspecified severe protein-calorie malnutrition: Secondary | ICD-10-CM

## 2016-05-11 DIAGNOSIS — D709 Neutropenia, unspecified: Secondary | ICD-10-CM

## 2016-05-11 DIAGNOSIS — R000 Tachycardia, unspecified: Secondary | ICD-10-CM | POA: Diagnosis not present

## 2016-05-11 DIAGNOSIS — L89153 Pressure ulcer of sacral region, stage 3: Secondary | ICD-10-CM | POA: Diagnosis not present

## 2016-05-11 DIAGNOSIS — N319 Neuromuscular dysfunction of bladder, unspecified: Secondary | ICD-10-CM

## 2016-05-11 NOTE — Progress Notes (Signed)
Location:   [place] Room Number: [place] of Service:  SNF (31)   CODE STATUS: Full Code  No Known Allergies  Chief Complaint  Patient presents with  . Medical Management of Chronic Issues    1 month follow up    HPI:  She is a long term resident of this facility being seen for the management of her chronic illnesses. She remains tachycardic with a low blood pressure. She has declined a 2-d echo twice. I have spoken with Dr. [name] and agrees that she will need to be seen by cardiology    Past Medical History:  Diagnosis Date  . Buttock wound 03/03/2016  . MS (multiple sclerosis) ([place])   . Pneumonia 02/2016  . Protein calorie malnutrition ([place])   . Severe sepsis ([place]) 03/03/2016  . UTI (urinary tract infection) 02/2016    Past Surgical History:  Procedure Laterality Date  . NO PAST SURGERIES      Social History   Social History  . Marital status: Single    Spouse name: N/A  . Number of children: N/A  . Years of education: N/A   Occupational History  . Not on file.   Social History Main Topics  . Smoking status: Former Smoker    Packs/day: 1.00    Types: Cigarettes    Quit date: 02/01/2010  . Smokeless tobacco: Never Used  . Alcohol use No  . Drug use: No  . Sexual activity: Not on file   Other Topics Concern  . Not on file   Social History Narrative  . No narrative on file   Family History  Problem Relation Age of Onset  . Mental illness Sister       VITAL SIGNS BP 95/62   Pulse (!) 117   Temp (!) 96.3 F (35.7 C)   Resp 20   Ht '5\' 9"'  (1.753 m)   Wt 87 lb 8 oz (39.7 kg)   SpO2 95%   BMI 12.92 kg/m   Patient's Medications  New Prescriptions   No medications on file  Previous Medications   ACETAMINOPHEN (TYLENOL) 325 MG TABLET    Give 650 by mouth in the morning for chronic pain and 650 mg by mouth every 6 hours as needed for mild pain   AMINO ACIDS-PROTEIN HYDROLYS (FEEDING SUPPLEMENT, PRO-STAT SUGAR FREE 64,) LIQD     Take 30 mLs by mouth 3 (three) times daily with meals.   BISACODYL (DULCOLAX) 5 MG EC TABLET    Take 1 tablet (5 mg total) by mouth daily as needed for moderate constipation.   FERROUS SULFATE 325 (65 FE) MG TABLET    Take 1 tablet (325 mg total) by mouth 2 (two) times daily with a meal.   MULTIPLE VITAMINS-MINERALS (DECUBI-VITE) CAPS    Take 1 capsule by mouth daily.   NUTRITIONAL SUPPLEMENTS (NUTRITIONAL SUPPLEMENT PO)    Take by mouth. House Supplement - give 237 ml between meals / Protien calorie malnutrition Nutritional treat with meals every day   NUTRITIONAL SUPPLEMENTS (NUTRITIONAL SUPPLEMENT PO)    Take by mouth. Give Nutritional Treats with meals daily for protein calorie malnutrition   POLYETHYLENE GLYCOL (MIRALAX / GLYCOLAX) PACKET    Take 17 g by mouth daily.  Modified Medications   No medications on file  Discontinued Medications   No medications on file     SIGNIFICANT DIAGNOSTIC EXAMS  03-04-16: swallow study: recommend diet of dysphagia 3 solids, honey thick liquids (no straw), meds whole  in puree, and cough/clear throat after every few bites/sips  03-07-16: chest x-ray: Increase left lung opacity concerning for worsening pneumonia or atelectasis with associated pleural effusion.   03-07-16: mri of pelvis: 1. Cellulitis and diffuse myofasciitis involving the pelvic and hip musculature. There also or rim enhancing fluid collections in the gluteus maximus muscles bilaterally concerning for pyomyositis. 2. No findings to suggest septic arthritis or osteomyelitis. 3. Shallow sacral decubitus ulcer but no underlying sacral osteomyelitis. 4. Markedly distended bladder.  03-07-16: mri of lumbar: 1. Negative for lumbar spine infection or impingement. 2. There is non organized edema in the subcutaneous fat. Negative for abscess. 3. Urinary retention causing bilateral hydroureteronephrosis, also seen by CT in 2010.   03-12-16: bone marrow biopsy: Successful CT guided left iliac bone  marrow aspiration and core biopsy. Note, an additional sample was set aside for Gram stain and culture analysis.  04-05-16: transvaginal and pelvic ultrasound: 1. Nonvisualization of the left ovary   2. Otherwise negative pelvic ultrasound  04-05-16: renal ultrasound: Mild right hydronephrosis.  04-05-16: chest x-ray: No evidence of pneumonia.  04-07-16: ct angio of chest: 1. No acute pulmonary embolus. 2. Tiny tree-in-bud densities in the right upper and lower lobes consistent with bronchiolitis. 3. 5 mm or less pulmonary nodular densities are also seen within the right upper and lower lobes possibly representing subpleural and perifissural lymph nodes. No follow-up needed if patient is low-risk (and has no known or suspected primary neoplasm). Non-contrast chest CT can be considered in 12 months if patient is high-risk.      LABS REVIEWED:   03-02-16: wbc 13.2; hgb 11.7; hct 37.8; mcv 92.0; plt 526; glucose 163; bun 12; creat 0.77; k+ 3.7; na++ 141; ast 113; alt 55; alk phos 118; total bili 1.7; albumin 2.2  03-05-16: wbc 2.9; hgb 8.8; hct 29.4; mcv 93.3; plt 360; glucose 110; bun 13; creat 0.87; k+ 3.5 ;na++ 158; mag 2.3 03-07-16: wbc 1.8; hgb 9.3; hct 31.0; mcv 92.3; plt 299; glucose 112; bun 9; creat 0.99; k+ 3.2 ;na++ 143; mag 2.0; urine culture: e-coli; blood cultures: neg; HIV nr 03-12-16: wbc 2.6; hgb 8.7; hct 29.0; mcv 89.8; plt 392; glucose 104; bun <5; creat 0.47; k+ 3.6; na++ 141 03-15-16: wbc 24.2; hgb 8.7; hct 28.9; mcv 90.6; plt 496; glucose 88; bun 6; creat 0.41; k+ 3.9; na++ 144 mag 1.8  03-19-16: wbc 12.3; hgb 10.7; hct 35.8; mcv 91.7; plt 513; glucose 112; bun 14.2; creat 0.40; k+ 4.7; na++149; alt 64; ast 73; albumin 3.2  04-05-16: wbc 10.9; hgb 10.3; hct 33.0; mcv 89.7; plt 632; glucose 92; bun 10; creat 0.58; k+ 3.7; na++ 141; mag 1.7; urine culture: citrobacter freundii:cipro  04-06-16: wbc 9.0; hgb 8.2; hct 26.7; mcv 91.1; plt 532; glucose 89; bun 9; creat 0.41; k+ 3.2; na++ 143; liver  normal albumin 2.1 04-07-16: wbc 7.9 ;hgb 7.0; hct 22.8; mcv 90.5; plt 517;glucose 82; bun 6; creat 0.38; k+ 4.2; na++ 142; iron 14; tibc 174; ferritin 276; folate 10.1; vit B 12: 423; tsh 1.741; d-dimer 3.67  04-08-16: wbc 6.3; hgb 8.4; hct 27.1; mcv 84.4 ;plt 526; glucose 87; bun 6; creat 0.34; k+ 3.8; na++ 139      Review of Systems  Constitutional: Negative for malaise/fatigue.  Respiratory: Negative for cough and shortness of breath.   Cardiovascular: Negative for chest pain, palpitations and leg swelling.  Gastrointestinal: Negative for abdominal pain, constipation and heartburn.  Genitourinary:       Has foley   Musculoskeletal:  has generalized muscle aches; tylenol in am relieves .  Skin: Negative.        Has skin wounds   Neurological: Negative for dizziness.  Psychiatric/Behavioral: The patient is not nervous/anxious.      Physical Exam  Constitutional: She is oriented to person, place, and time. No distress.  Frail; malnurished   Eyes: Conjunctivae are normal.  Neck: Neck supple. No JVD present. No thyromegaly present.  Cardiovascular: Normal rate, regular rhythm and intact distal pulses.   Respiratory: Effort normal and breath sounds normal. No respiratory distress. She has no wheezes.  GI: Soft. Bowel sounds are normal. She exhibits no distension. There is no tenderness.  Genitourinary:  Genitourinary Comments: Has foley   Musculoskeletal: She exhibits no edema.  Able to move upper extremities Bilateral lower extremities with splints in place    Lymphadenopathy:    She has no cervical adenopathy.  Neurological: She is alert and oriented to person, place, and time.  Skin: Skin is warm and dry. She is not diaphoretic.  Has stage coccyx: 6.4 x 4.7 x 0.4 cm 75% necrotic  Psychiatric: She has a normal mood and affect.     ASSESSMENT/ PLAN:  1. MS: is followed by neurology. Has bilateral lower extremity splints in place. She is on Rituxan every 6 months at [facility]; she is due July 06, 2016.   2.  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 Sacral decubitus stage III: will continue treatment per facility protocol; will be followed by wound Dr.  4. Neurogenic bladder; has long term foley   5. Protein calorie malnutrition: severe: albumin is 3.2 will continue supplements per facility protocol and will continue prostat 30 cc three times daily   6. Constipation: will continue miralax daily   7. Neutropenia: has had a bone biopsy; will follow up with oncology  8. Dysphagia: no further signs of aspiration; will continue current plan of care and will monitor  9. Anemia:  Likely related to chronic disease and iron deficiency: is status post transfusion 04/2016: hgb 10.7; will have her follow up with oncology and will monitor her status; will get fbot   10. Tachycardia with low blood pressure: she did not tolerate toprol xl. Will setup cardiology appointment.     Ok Edwards NP [facility]  Contact [phone] Monday through Friday 8am- 5pm  After hours call [phone]

## 2016-05-12 LAB — BASIC METABOLIC PANEL
BUN: 11 mg/dL (ref 4–21)
Creatinine: 0.5 mg/dL (ref 0.5–1.1)
GLUCOSE: 92 mg/dL
POTASSIUM: 4.5 mmol/L (ref 3.4–5.3)
SODIUM: 140 mmol/L (ref 137–147)

## 2016-05-12 LAB — CBC AND DIFFERENTIAL
HCT: 33 % — AB (ref 36–46)
Hemoglobin: 10.3 g/dL — AB (ref 12.0–16.0)
Neutrophils Absolute: 8 /uL
WBC: 9.8 10^3/mL

## 2016-05-12 LAB — HEPATIC FUNCTION PANEL
ALK PHOS: 81 U/L (ref 25–125)
ALT: 15 U/L (ref 7–35)
AST: 17 U/L (ref 13–35)
Bilirubin, Total: 0.5 mg/dL

## 2016-05-18 ENCOUNTER — Encounter: Payer: Self-pay | Admitting: Adult Health

## 2016-05-18 ENCOUNTER — Non-Acute Institutional Stay (SKILLED_NURSING_FACILITY): Payer: Medicare Other | Admitting: Adult Health

## 2016-05-18 DIAGNOSIS — L89142 Pressure ulcer of left lower back, stage 2: Secondary | ICD-10-CM | POA: Diagnosis not present

## 2016-05-18 DIAGNOSIS — R000 Tachycardia, unspecified: Secondary | ICD-10-CM

## 2016-05-18 DIAGNOSIS — L89150 Pressure ulcer of sacral region, unstageable: Secondary | ICD-10-CM | POA: Diagnosis not present

## 2016-05-18 DIAGNOSIS — L89154 Pressure ulcer of sacral region, stage 4: Secondary | ICD-10-CM | POA: Diagnosis not present

## 2016-05-18 NOTE — Progress Notes (Signed)
Location:    [place] Room Number: [place] of Service:  SNF (31)   CODE STATUS: Full Code  No Known Allergies  Chief Complaint  Patient presents with  . Acute Visit    Tachycardia    HPI:  She is tachycardic. Her blood pressure is low. She denies any chest pain or shortness of breath. She tells me that she has had this for "years". She has declined the 2-d echo twice. She tells me that she does not need or want the test. I did instruct her that the continued tachycardia is playing a strain on her heart with her low blood pressure. She is willing to see a cardiologist.    Past Medical History:  Diagnosis Date  . Buttock wound 03/03/2016  . MS (multiple sclerosis) ([place])   . Pneumonia 02/2016  . Protein calorie malnutrition ([place])   . Severe sepsis ([place]) 03/03/2016  . UTI (urinary tract infection) 02/2016    Past Surgical History:  Procedure Laterality Date  . NO PAST SURGERIES      Social History   Social History  . Marital status: Single    Spouse name: N/A  . Number of children: N/A  . Years of education: N/A   Occupational History  . Not on file.   Social History Main Topics  . Smoking status: Former Smoker    Packs/day: 1.00    Types: Cigarettes    Quit date: 02/01/2010  . Smokeless tobacco: Never Used  . Alcohol use No  . Drug use: No  . Sexual activity: Not on file   Other Topics Concern  . Not on file   Social History Narrative  . No narrative on file   Family History  Problem Relation Age of Onset  . Mental illness Sister       VITAL SIGNS BP 108/66   Pulse (!) 120   Temp (!) 96.3 F (35.7 C)   Resp 20   Ht _0  (1.753 m)   Wt 87 lb 8 oz (39.7 kg)   BMI 12.92 kg/m   Patient's Medications  New Prescriptions   No medications on file  Previous Medications   ACETAMINOPHEN (TYLENOL) 325 MG TABLET    Give 650 by mouth in the morning for chronic pain and 650 mg by mouth every 6 hours as needed for mild pain   AMINO  ACIDS-PROTEIN HYDROLYS (FEEDING SUPPLEMENT, PRO-STAT SUGAR FREE 64,) LIQD    Take 30 mLs by mouth 3 (three) times daily with meals.   BISACODYL (DULCOLAX) 5 MG EC TABLET    Take 1 tablet (5 mg total) by mouth daily as needed for moderate constipation.   FERROUS SULFATE 325 (65 FE) MG TABLET    Take 1 tablet (325 mg total) by mouth 2 (two) times daily with a meal.   METOPROLOL TARTRATE (LOPRESSOR) 25 MG TABLET    Take 12.5 mg by mouth 2 (two) times daily.   MULTIPLE VITAMINS-MINERALS (DECUBI-VITE) CAPS    Take 1 capsule by mouth daily.   NUTRITIONAL SUPPLEMENTS (NUTRITIONAL SUPPLEMENT PO)    Take by mouth. House Supplement - give 237 ml between meals / Protien calorie malnutrition Nutritional treat with meals every day   NUTRITIONAL SUPPLEMENTS (NUTRITIONAL SUPPLEMENT PO)    Take by mouth. Give Nutritional Treats with meals daily for protein calorie malnutrition   POLYETHYLENE GLYCOL (MIRALAX / GLYCOLAX) PACKET    Take 17 g by mouth daily.  Modified Medications   No medications on  file  Discontinued Medications   No medications on file     SIGNIFICANT DIAGNOSTIC EXAMS   03-04-16: swallow study: recommend diet of dysphagia 3 solids, honey thick liquids (no straw), meds whole in puree, and cough/clear throat after every few bites/sips  03-07-16: chest x-ray: Increase left lung opacity concerning for worsening pneumonia or atelectasis with associated pleural effusion.   03-07-16: mri of pelvis: 1. Cellulitis and diffuse myofasciitis involving the pelvic and hip musculature. There also or rim enhancing fluid collections in the gluteus maximus muscles bilaterally concerning for pyomyositis. 2. No findings to suggest septic arthritis or osteomyelitis. 3. Shallow sacral decubitus ulcer but no underlying sacral osteomyelitis. 4. Markedly distended bladder.  03-07-16: mri of lumbar: 1. Negative for lumbar spine infection or impingement. 2. There is non organized edema in the subcutaneous fat. Negative for  abscess. 3. Urinary retention causing bilateral hydroureteronephrosis, also seen by CT in 2010.   03-12-16: bone marrow biopsy: Successful CT guided left iliac bone marrow aspiration and core biopsy. Note, an additional sample was set aside for Gram stain and culture analysis.  04-05-16: transvaginal and pelvic ultrasound: 1. Nonvisualization of the left ovary   2. Otherwise negative pelvic ultrasound  04-05-16: renal ultrasound: Mild right hydronephrosis.  04-05-16: chest x-ray: No evidence of pneumonia.  04-07-16: ct angio of chest: 1. No acute pulmonary embolus. 2. Tiny tree-in-bud densities in the right upper and lower lobes consistent with bronchiolitis. 3. 5 mm or less pulmonary nodular densities are also seen within the right upper and lower lobes possibly representing subpleural and perifissural lymph nodes. No follow-up needed if patient is low-risk (and has no known or suspected primary neoplasm). Non-contrast chest CT can be considered in 12 months if patient is high-risk.      LABS REVIEWED:   03-02-16: wbc 13.2; hgb 11.7; hct 37.8; mcv 92.0; plt 526; glucose 163; bun 12; creat 0.77; k+ 3.7; na++ 141; ast 113; alt 55; alk phos 118; total bili 1.7; albumin 2.2  03-05-16: wbc 2.9; hgb 8.8; hct 29.4; mcv 93.3; plt 360; glucose 110; bun 13; creat 0.87; k+ 3.5 ;na++ 158; mag 2.3 03-07-16: wbc 1.8; hgb 9.3; hct 31.0; mcv 92.3; plt 299; glucose 112; bun 9; creat 0.99; k+ 3.2 ;na++ 143; mag 2.0; urine culture: e-coli; blood cultures: neg; HIV nr 03-12-16: wbc 2.6; hgb 8.7; hct 29.0; mcv 89.8; plt 392; glucose 104; bun <5; creat 0.47; k+ 3.6; na++ 141 03-15-16: wbc 24.2; hgb 8.7; hct 28.9; mcv 90.6; plt 496; glucose 88; bun 6; creat 0.41; k+ 3.9; na++ 144 mag 1.8  03-19-16: wbc 12.3; hgb 10.7; hct 35.8; mcv 91.7; plt 513; glucose 112; bun 14.2; creat 0.40; k+ 4.7; na++149; alt 64; ast 73; albumin 3.2  04-05-16: wbc 10.9; hgb 10.3; hct 33.0; mcv 89.7; plt 632; glucose 92; bun 10; creat 0.58; k+ 3.7; na++ 141;  mag 1.7; urine culture: citrobacter freundii:cipro  04-06-16: wbc 9.0; hgb 8.2; hct 26.7; mcv 91.1; plt 532; glucose 89; bun 9; creat 0.41; k+ 3.2; na++ 143; liver normal albumin 2.1 04-07-16: wbc 7.9 ;hgb 7.0; hct 22.8; mcv 90.5; plt 517;glucose 82; bun 6; creat 0.38; k+ 4.2; na++ 142; iron 14; tibc 174; ferritin 276; folate 10.1; vit B 12: 423; tsh 1.741; d-dimer 3.67  04-08-16: wbc 6.3; hgb 8.4; hct 27.1; mcv 84.4 ;plt 526; glucose 87; bun 6; creat 0.34; k+ 3.8; na++ 139      Review of Systems  Constitutional: Negative for malaise/fatigue.  Respiratory: Negative for cough and shortness  of breath.   Cardiovascular: Negative for chest pain, palpitations and leg swelling.  Gastrointestinal: Negative for abdominal pain, constipation and heartburn.  Genitourinary:       Has foley   Musculoskeletal: has generalized muscle aches; tylenol in am relieves .  Skin: Negative.        Has skin wounds   Neurological: Negative for dizziness.  Psychiatric/Behavioral: The patient is not nervous/anxious.      Physical Exam  Constitutional: She is oriented to person, place, and time. No distress.  Frail; malnurished   Eyes: Conjunctivae are normal.  Neck: Neck supple. No JVD present. No thyromegaly present.  Cardiovascular:regular rhythm and intact distal pulses.  is tachycardic  Respiratory: Effort normal and breath sounds normal. No respiratory distress. She has no wheezes.  GI: Soft. Bowel sounds are normal. She exhibits no distension. There is no tenderness.  Genitourinary:  Genitourinary Comments: Has foley   Musculoskeletal: She exhibits no edema.  Able to move upper extremities Bilateral lower extremities with splints in place    Lymphadenopathy:    She has no cervical adenopathy.  Neurological: She is alert and oriented to person, place, and time.  Skin: Skin is warm and dry. She is not diaphoretic.  Has stage coccyx: 6.4 x 4.7 x 0.4 cm 75% necrotic  Psychiatric: She has a normal mood and  affect.    ASSESSMENT/ PLAN:  1. Tachycardia: she could not tolerate lopressor; will get an ekg; and will setup cardiology consult.   Time spent with patient  30  minutes >50% time spent counseling; reviewing medical record; tests; labs; and developing future plan of care   MD is aware of resident's narcotic use and is in agreement with current plan of care. We will attempt to wean resident as apropriate   Ok Edwards NP [facility]  Contact [phone] Monday through Friday 8am- 5pm  After hours call [phone]

## 2016-05-24 ENCOUNTER — Encounter: Payer: Self-pay | Admitting: Cardiology

## 2016-05-25 ENCOUNTER — Encounter (INDEPENDENT_AMBULATORY_CARE_PROVIDER_SITE_OTHER): Payer: Self-pay

## 2016-05-25 ENCOUNTER — Ambulatory Visit (INDEPENDENT_AMBULATORY_CARE_PROVIDER_SITE_OTHER): Payer: Medicare Other | Admitting: Cardiology

## 2016-05-25 ENCOUNTER — Encounter: Payer: Self-pay | Admitting: Cardiology

## 2016-05-25 VITALS — BP 120/72 | HR 123 | Ht 69.0 in | Wt 87.0 lb

## 2016-05-25 DIAGNOSIS — G35 Multiple sclerosis: Secondary | ICD-10-CM | POA: Diagnosis not present

## 2016-05-25 DIAGNOSIS — R000 Tachycardia, unspecified: Secondary | ICD-10-CM

## 2016-05-25 DIAGNOSIS — L89142 Pressure ulcer of left lower back, stage 2: Secondary | ICD-10-CM | POA: Diagnosis not present

## 2016-05-25 DIAGNOSIS — E43 Unspecified severe protein-calorie malnutrition: Secondary | ICD-10-CM

## 2016-05-25 DIAGNOSIS — L89154 Pressure ulcer of sacral region, stage 4: Secondary | ICD-10-CM | POA: Diagnosis not present

## 2016-05-25 NOTE — Patient Instructions (Addendum)
Medication Instructions:  The current medical regimen is effective;  continue present plan and medications.  Testing/Procedures: Your physician has requested that you have an echocardiogram. Echocardiography is a painless test that uses sound waves to create images of your heart. It provides your doctor with information about the size and shape of your heart and how well your heart's chambers and valves are working. This procedure takes approximately one hour. There are no restrictions for this procedure.  Follow-Up: Follow up as needed after your testing if necessary.  Thank you for choosing [facility]!!

## 2016-05-25 NOTE — Progress Notes (Signed)
Cardiology Office Note:    Date:  05/25/2016   ID:  [name], DOB 07-11-1975, MRN [id number]  PCP:  [name], MD    Referring MD: [name], DO     History of Present Illness:    Deanna Baldwin is a 41 y.o. female with a hx of Multiple sclerosis, sepsis in January 2018 here for evaluation of tachycardia at the request of Dr. [name]. On 04/07/16 she had a CT angiogram of her chest which showed no pulmonary embolus. An EKG from 03/02/16 was personally viewed which showed sinus tachycardia rate 139 with baseline artifact. Tachycardia was noted at the nursing home she was residing, [place]. Asymptomatic. She has gone through a significant weight loss. Hemoglobin 8.4 on 04/08/16. Fairly consistent with prior values. HIV nonreactive, TSH 1.7, normal. Potassium 3.8.  She has been taking low-dose metoprolol 12.5 mg twice a day. Her blood pressure remains quite soft given her low body weight. 102/72 today. Currently her heart rate is 123 bpm on her EKG. Clearly she is demonstrate sinus tachycardia. I do not see any evidence of atrial flutter.  One year ago office visit vital signs showed heart rate of 100 bpm.  She once again is denying any symptoms of chest pain, no shortness of breath, no recent fevers, no syncope, no dizziness. She does feel somewhat dehydrated at times. No bleeding.  Prior CV studies:   The following studies were reviewed today:  EKG as above  Past Medical History:  Diagnosis Date  . Buttock wound 03/03/2016  . MS (multiple sclerosis) ([place])   . Pneumonia 02/2016  . Protein calorie malnutrition ([place])   . Severe sepsis ([place]) 03/03/2016  . UTI (urinary tract infection) 02/2016    Past Surgical History:  Procedure Laterality Date  . NO PAST SURGERIES      Current Medications: Current Meds  Medication Sig  . acetaminophen (TYLENOL) 325 MG tablet Give 650 by mouth in the morning for chronic pain and 650 mg by mouth every 6 hours as needed for mild  pain  . Amino Acids-Protein Hydrolys (FEEDING SUPPLEMENT, PRO-STAT SUGAR FREE 64,) LIQD Take 30 mLs by mouth 3 (three) times daily with meals.  . bisacodyl (DULCOLAX) 5 MG EC tablet Take 1 tablet (5 mg total) by mouth daily as needed for moderate constipation.  . ferrous sulfate 325 (65 FE) MG tablet Take 1 tablet (325 mg total) by mouth 2 (two) times daily with a meal.  . metoprolol tartrate (LOPRESSOR) 25 MG tablet Take 12.5 mg by mouth 2 (two) times daily.  . Multiple Vitamins-Minerals (DECUBI-VITE) CAPS Take 1 capsule by mouth daily.  . Nutritional Supplements (NUTRITIONAL SUPPLEMENT PO) Take by mouth. House Supplement - give 237 ml between meals / Protien calorie malnutrition Nutritional treat with meals every day  . Nutritional Supplements (NUTRITIONAL SUPPLEMENT PO) Take by mouth. Give Nutritional Treats with meals daily for protein calorie malnutrition  . polyethylene glycol (MIRALAX / GLYCOLAX) packet Take 17 g by mouth daily.     Allergies:   Patient has no known allergies.   Social History   Social History  . Marital status: Single    Spouse name: N/A  . Number of children: N/A  . Years of education: N/A   Social History Main Topics  . Smoking status: Former Smoker    Packs/day: 1.00    Types: Cigarettes    Quit date: 02/01/2010  . Smokeless tobacco: Never Used  . Alcohol use No  . Drug use: No  .  Sexual activity: Not Asked   Other Topics Concern  . None   Social History Narrative  . None     Family History  Problem Relation Age of Onset  . Mental illness Sister      ROS:   Please see the history of present illness.    ROS All other systems reviewed and are negative.   EKGs/Labs/Other Test Reviewed:    EKG: Reviewed as above.EKG performed today on 05/25/16 shows sinus tachycardia 123 bpm with nonspecific ST-T wave changes. Personally viewed.  Recent Labs: 04/05/2016: Magnesium 1.7 04/07/2016: TSH 1.741 04/08/2016: Platelets 526 05/12/2016: ALT 15; BUN 11;  Creatinine 0.5; Hemoglobin 10.3; Potassium 4.5; Sodium 140   Recent Lipid Panel No results found for: CHOL, TRIG, HDL, CHOLHDL, VLDL, LDLCALC, LDLDIRECT   Physical Exam:    VS:  BP 120/72   Pulse (!) 123   Ht 5\' 9"  (1.753 m)   Wt 87 lb (39.5 kg)   BMI 12.85 kg/m     Wt Readings from Last 3 Encounters:  05/25/16 87 lb (39.5 kg)  05/18/16 87 lb 8 oz (39.7 kg)  05/11/16 87 lb 8 oz (39.7 kg)     GEN: Thin, in wheelchair, severe and nutrition  HEENT: dry mouth Neck: no JVD, carotid bruits, or masses Cardiac: Tachycardic regular; no murmurs, rubs, or gallops,no edema  Respiratory:  clear to auscultation bilaterally, normal work of breathing GI: soft, nontender, nondistended, + BS MS: no deformity or atrophy  Skin: warm and dry, no rash Neuro:  Alert and Oriented x 3, weakness noted from multiple sclerosis. Psych: euthymic mood, full affect   ASSESSMENT:    1. Sinus tachycardia   2. Multiple sclerosis ([place])   3. Severe protein-calorie malnutrition ([place])    PLAN:    In order of problems listed above:  Sinus tachycardia  - ECG noted on 03/02/16 shows sinus tachycardia rate 139 bpm. This may been reactive in the setting of infection. During a subsequent encounter 13/27/18, her pulse was noted to be 97 bpm. Thyroid is normal/TSH is normal I should say. A subsequent pulse on 05/18/16 showed heart rate of 120 bpm.  - We will check an echocardiogram to ensure proper structure and function of her heart.  - CT angiogram was negative for pulmonary embolism. Anemia has been ongoing.  - Continue to encourage adequate hydration. Her mouth does appear dry  Severe protein malnutrition  - BMI 16. Per primary team, ensure.  Multiple sclerosis  - Per Dr. Shelia Media.  Dispo:  No Follow-up on file. We will follow-up with results of echocardiogram.  Medication Adjustments/Labs and Tests Ordered: Current medicines are reviewed at length with the patient today.  Concerns regarding medicines are  outlined above.  Orders placed this visit:  Orders Placed This Encounter  Procedures  . EKG 12-Lead  . ECHOCARDIOGRAM COMPLETE   Medication changes this visit: No orders of the defined types were placed in this encounter.   Signed, Candee Furbish, MD  05/25/2016 2:35 PM    [facility] [address], [place], [place]  [address] Phone: [phone]; Fax: [phone]

## 2016-05-30 ENCOUNTER — Encounter (HOSPITAL_COMMUNITY): Payer: Self-pay | Admitting: *Deleted

## 2016-05-30 ENCOUNTER — Emergency Department (HOSPITAL_COMMUNITY)
Admission: EM | Admit: 2016-05-30 | Discharge: 2016-05-30 | Disposition: A | Discharge status: Home / Self Care | Payer: Medicare Other | Attending: Emergency Medicine | Admitting: Emergency Medicine

## 2016-05-30 DIAGNOSIS — T83098A Other mechanical complication of other indwelling urethral catheter, initial encounter: Secondary | ICD-10-CM | POA: Diagnosis not present

## 2016-05-30 DIAGNOSIS — Z87891 Personal history of nicotine dependence: Secondary | ICD-10-CM | POA: Insufficient documentation

## 2016-05-30 DIAGNOSIS — T83021A Displacement of indwelling urethral catheter, initial encounter: Secondary | ICD-10-CM

## 2016-05-30 DIAGNOSIS — Y69 Unspecified misadventure during surgical and medical care: Secondary | ICD-10-CM | POA: Insufficient documentation

## 2016-05-30 NOTE — ED Notes (Addendum)
[facility] notified to come transprot pt back to facility

## 2016-05-30 NOTE — ED Provider Notes (Signed)
[facility] DEPT Provider Note   CSN: [id number] Arrival date & time: 05/30/16  1534     History   Chief Complaint Chief Complaint  Patient presents with  . Foley dislodged    HPI Deanna Baldwin is a 41 y.o. female.  HPI   Pt is a 41 yo female with PMH of MS, spastic paraplegia and severe protein calorie malnutrition who presents to the ED via EMS from [place] facility due to her foley catheter coming out. Pt reports her foley fell out this morning. Denies any other pain or complaints at this time. Denies fever, chills, CP, SOB, abdominal pain, N/V/D, hematuria, flank pain. Pt reports she has been feeling great since being discharged from the hospital last month for UTI. She notes she has an appointment scheduled with [facility] tomorrow for follow up and also reports planning on having a cystoscopy performed.   Past Medical History:  Diagnosis Date  . Buttock wound 03/03/2016  . MS (multiple sclerosis) ([place])   . Pneumonia 02/2016  . Protein calorie malnutrition ([place])   . Severe sepsis ([employer]) 03/03/2016  . UTI (urinary tract infection) 02/2016    Patient Active Problem List   Diagnosis Date Noted  . Neurogenic bladder 04/09/2016  . Foley catheter in place 04/09/2016  . Sinus tachycardia 04/08/2016  . Anemia of chronic disease 04/06/2016  . Dysphagia, oral phase 03/17/2016  . Neutropenia ([place])   . Other neutropenia ([place])   . Muscle abscess   . Myofascitis   . Sepsis secondary to UTI ([place])   . Aspiration pneumonia of left lower lobe due to vomit ([place])   . Sacral decubitus ulcer, stage III ([place])   . Multiple sclerosis ([place])   . Protein-calorie malnutrition, severe ([place])   . Hypokalemia 03/05/2016  . Hypernatremia   . Pressure injury of skin 03/03/2016  . 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 Severe sepsis ([place]) 03/02/2016  . Community acquired pneumonia 03/02/2016  . UTI (urinary tract infection) 03/02/2016  .  Abnormal LFTs 03/02/2016    Past Surgical History:  Procedure Laterality Date  . NO PAST SURGERIES      OB History    No data available       Home Medications    Prior to Admission medications   Medication Sig Start Date End Date Taking? Authorizing Provider  acetaminophen (TYLENOL) 325 MG tablet Give 650 by mouth in the morning for chronic pain and 650 mg by mouth every 6 hours as needed for mild pain    Historical Provider, MD  Amino Acids-Protein Hydrolys (FEEDING SUPPLEMENT, PRO-STAT SUGAR FREE 64,) LIQD Take 30 mLs by mouth 3 (three) times daily with meals.    Historical Provider, MD  bisacodyl (DULCOLAX) 5 MG EC tablet Take 1 tablet (5 mg total) by mouth daily as needed for moderate constipation. 03/15/16   Florencia Reasons, MD  ferrous sulfate 325 (65 FE) MG tablet Take 1 tablet (325 mg total) by mouth 2 (two) times daily with a meal. 04/08/16   Janece Canterbury, MD  metoprolol tartrate (LOPRESSOR) 25 MG tablet Take 12.5 mg by mouth 2 (two) times daily.    Historical Provider, MD  Multiple Vitamins-Minerals (DECUBI-VITE) CAPS Take 1 capsule by mouth daily.    Historical Provider, MD  Nutritional Supplements (NUTRITIONAL SUPPLEMENT PO) Take by mouth. House Supplement - give 237 ml between meals / Protien calorie malnutrition Nutritional treat with meals every day    Historical Provider, MD  Nutritional Supplements (NUTRITIONAL  SUPPLEMENT PO) Take by mouth. Give Nutritional Treats with meals daily for protein calorie malnutrition    Historical Provider, MD  polyethylene glycol (MIRALAX / GLYCOLAX) packet Take 17 g by mouth daily.    Historical Provider, MD    Family History Family History  Problem Relation Age of Onset  . Mental illness Sister     Social History Social History  Substance Use Topics  . Smoking status: Former Smoker    Packs/day: 1.00    Types: Cigarettes    Quit date: 02/01/2010  . Smokeless tobacco: Never Used  . Alcohol use No     Allergies   Patient has no  known allergies.   Review of Systems Review of Systems  Genitourinary:       Foley change  All other systems reviewed and are negative.    Physical Exam Updated Vital Signs BP 92/60 (BP Location: Left Arm)   Pulse (!) 131   Temp 97.7 F (36.5 C) (Oral)   Resp 18   SpO2 97%   Physical Exam  Constitutional: She is oriented to person, place, and time. No distress.  Cachetic appearing female  HENT:  Head: Normocephalic and atraumatic.  Eyes: Conjunctivae and EOM are normal. Right eye exhibits no discharge. Left eye exhibits no discharge. No scleral icterus.  Neck: Normal range of motion. Neck supple.  Cardiovascular: Regular rhythm, normal heart sounds and intact distal pulses.   Tachycardic, HR 131  Pulmonary/Chest: Effort normal and breath sounds normal. No respiratory distress. She has no wheezes. She has no rales. She exhibits no tenderness.  Abdominal: Soft. Bowel sounds are normal. She exhibits no distension and no mass. There is no tenderness. There is no rebound and no guarding.  Musculoskeletal: She exhibits no edema.  Neurological: She is alert and oriented to person, place, and time. She displays atrophy. She exhibits abnormal muscle tone.  Spastic paraplegia  Skin: Skin is warm and dry. She is not diaphoretic.  2 large decubitus ulcers present to sacral region without surrounding swelling, erythema, warmth or drainage.   Nursing note and vitals reviewed.    ED Treatments / Results  Labs (all labs ordered are listed, but only abnormal results are displayed) Labs Reviewed - No data to display  EKG  EKG Interpretation None       Radiology No results found.  Procedures Procedures (including critical care time)  Medications Ordered in ED Medications - No data to display   Initial Impression / Assessment and Plan / ED Course  I have reviewed the triage vital signs and the nursing notes.  Pertinent labs & imaging results that were available during my  care of the patient were reviewed by me and considered in my medical decision making (see chart for details).    Pt presents for foley change after her foley fell out this morning at nursing facility. Denies any pain or complaints. PMH of MS, spastic paraplegia and severe protein calorie malnutrition. Vitals showed HR 131, chart review shows pt with hx of chronic tachycardia (HR 110-130), remaining vitals stable, afebrile. Exam showed cachetic appearing female with 2 large decubitus ulcers to sacral region without secondary infection. New foley catheter placed in the ED. Pt reports f/u appointment with [facility] tomorrow. Plan to d/c pt back to nursing facility with urology f/u tomorrow.    Final Clinical Impressions(s) / ED Diagnoses   Final diagnoses:  Dislodged Foley catheter, initial encounter [facility])    New Prescriptions New Prescriptions   No medications  on file     Nona Dell, [place] 05/30/16 1631    Dorie Rank, MD 06/02/16 1201

## 2016-05-30 NOTE — ED Notes (Signed)
Bed: W[facility]79 Expected date:  Expected time:  Means of arrival:  Comments: EMS- catheter change

## 2016-05-30 NOTE — ED Triage Notes (Signed)
BIB [facility] due to cath coming out, unclear as to how it come out.

## 2016-05-30 NOTE — Discharge Instructions (Signed)
Follow up at your scheduled appointment tomorrow with [facility] for follow up evaluation and further management of your foley catheter.  Please return to the Emergency Department if symptoms worsen or new onset of fever, abdominal pain, vomiting, blood in urine, foley problem, unable to urinate.

## 2016-06-01 DIAGNOSIS — L89154 Pressure ulcer of sacral region, stage 4: Secondary | ICD-10-CM | POA: Diagnosis not present

## 2016-06-01 DIAGNOSIS — L89142 Pressure ulcer of left lower back, stage 2: Secondary | ICD-10-CM | POA: Diagnosis not present

## 2016-06-03 ENCOUNTER — Ambulatory Visit (HOSPITAL_COMMUNITY)
Admission: RE | Admit: 2016-06-03 | Discharge: 2016-06-03 | Disposition: A | Discharge status: Home / Self Care | Payer: Medicare Other | Source: Ambulatory Visit | Attending: Cardiology | Admitting: Cardiology

## 2016-06-03 DIAGNOSIS — I429 Cardiomyopathy, unspecified: Secondary | ICD-10-CM | POA: Diagnosis not present

## 2016-06-03 DIAGNOSIS — R000 Tachycardia, unspecified: Secondary | ICD-10-CM | POA: Diagnosis not present

## 2016-06-03 NOTE — Progress Notes (Addendum)
  Echocardiogram 2D Echocardiogram has been performed. Patient in wheelchair during length of exam.   Deanna Baldwin L Androw 06/03/2016, 3:15 PM

## 2016-06-08 ENCOUNTER — Ambulatory Visit (INDEPENDENT_AMBULATORY_CARE_PROVIDER_SITE_OTHER): Payer: Medicare Other | Admitting: Neurology

## 2016-06-08 ENCOUNTER — Encounter: Payer: Self-pay | Admitting: Neurology

## 2016-06-08 VITALS — BP 109/69 | HR 133

## 2016-06-08 DIAGNOSIS — G8220 Paraplegia, unspecified: Secondary | ICD-10-CM | POA: Diagnosis not present

## 2016-06-08 DIAGNOSIS — G8250 Quadriplegia, unspecified: Secondary | ICD-10-CM | POA: Insufficient documentation

## 2016-06-08 DIAGNOSIS — G35 Multiple sclerosis: Secondary | ICD-10-CM

## 2016-06-08 NOTE — Progress Notes (Addendum)
PATIENT: Deanna Baldwin DOB: 03/29/1975  Chief Complaint  Patient presents with  . Multiple Sclerosis    She is here with her PT, [name], from [place].  They would like to discuss possible treatment with Botox for her lower extremity spasticity.  She is not taking any medication for her MS or muscle spasms.     HISTORICAL  Alphia Behanna is a 41 years old right-handed female, she is currently a resident at [facility], she is accompanied by her physical therapist [name]  and staff, seen in refer by her primary care physician Dr.  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  She lived at home with her 40 years old son and her aunt was her primary caregiver before she moved to current nursing home in 2017, her aunt visit her occasionally, facility is in charge of her medical care.  She was diagnosed with relapsing remitting multiple sclerosis since age 32, she initially presented with blurry vision, moderate fatigue, difficulty walking, she had frequent flareups over the years, eventually become wheelchair-bound since 2011,  She was under the care of neurologist at [facility] Dr. [name], I was able to review Dr. [name] note from 2013 to most recent  in February 2017, she had allergic reaction to Tysarbri IV infusion, was treated with rituximab since early 2013, 1000 mg every 6 months, she tolerated very well, infusion was in December 2016, because of her social status change, she was not able to get it every 6 months. For a while she was taking Ampyra for gait abnormality, but has stopped it because it is not helping her  Based on last neurology examination in February 2017, she was not ambulatory, in a power wheelchair, significant bilateral lower extremity spasticity, only has trace movement of bilateral leg, upper extremity motor strengths were  well-preserved.  MRI of the brain and spine in May 2015, chronic demyelinating lesions, no enhancing lesion.  She has multiple hospital admission recently, for frequent UTI, decubitus ulcer, with progressive increased weakness, also increased spasticity of bilateral lower extremity, decreased mobility to moving the chair, even to hold her sit up position,  I was able to review MRI of the cervical spine with and without contrast in May 2015, multifocal patchy area of T2/FLAIR signal abnormality in the entire cervical cord, visualized brainstem and upper thoracic spine, no contrast enhancement.  MRI of the brain 2015, stable November and distribution of numerous hyper intense flair lesions throughout the subcortical, deep and periventricular white matter, involvement of entire corpus callosum, as well as midbrain and pons, right cerebellum.  MRI of the brain 2012, questionable enhancement,  REVIEW OF SYSTEMS: Full 14 system review of systems performed and notable only for as above   ALLERGIES: No Known Allergies  HOME MEDICATIONS: Current Outpatient Prescriptions  Medication Sig Dispense Refill  . acetaminophen (TYLENOL) 325 MG tablet Give 650 by mouth in the morning for chronic pain and 650 mg by mouth every 6 hours as needed for mild pain    . Amino Acids-Protein Hydrolys (FEEDING SUPPLEMENT, PRO-STAT SUGAR FREE 64,) LIQD Take 30 mLs by mouth 3 (three) times daily with meals.    . bisacodyl (DULCOLAX) 5 MG EC tablet Take 1 tablet (5 mg total) by mouth daily as needed for moderate constipation. 30 tablet 0  . ferrous sulfate 325 (65 FE) MG tablet Take 1 tablet (325 mg total) by mouth  2 (two) times daily with a meal. 60 tablet 0  . metoprolol tartrate (LOPRESSOR) 25 MG tablet Take 12.5 mg by mouth 2 (two) times daily.    . Multiple Vitamins-Minerals (DECUBI-VITE) CAPS Take 1 capsule by mouth daily.    . Nutritional Supplements (NUTRITIONAL SUPPLEMENT PO) Take by mouth. House Supplement - give  237 ml between meals / Protien calorie malnutrition Nutritional treat with meals every day    . Nutritional Supplements (NUTRITIONAL SUPPLEMENT PO) Take by mouth. Give Nutritional Treats with meals daily for protein calorie malnutrition    . polyethylene glycol (MIRALAX / GLYCOLAX) packet Take 17 g by mouth daily.     No current facility-administered medications for this visit.     PAST MEDICAL HISTORY: Past Medical History:  Diagnosis Date  . Buttock wound 03/03/2016  . MS (multiple sclerosis) ([place])   . Pneumonia 02/2016  . Protein calorie malnutrition ([place])   . Severe sepsis ([place]) 03/03/2016  . UTI (urinary tract infection) 02/2016    PAST SURGICAL HISTORY: Past Surgical History:  Procedure Laterality Date  . NO PAST SURGERIES      FAMILY HISTORY: Family History  Problem Relation Age of Onset  . Mental illness Sister     SOCIAL HISTORY:  Social History   Social History  . Marital status: Single    Spouse name: N/A  . Number of children: N/A  . Years of education: N/A   Occupational History  . Disabled    Social History Main Topics  . Smoking status: Former Smoker    Packs/day: 1.00    Types: Cigarettes    Quit date: 02/01/2010  . Smokeless tobacco: Never Used  . Alcohol use No  . Drug use: No  . Sexual activity: Not on file   Other Topics Concern  . Not on file   Social History Narrative   She is a resident at [facility].   Right-handed.     PHYSICAL EXAM   Vitals:   06/08/16 1341  BP: 109/69  Pulse: (!) 133    Not recorded      There is no height or weight on file to calculate BMI.  PHYSICAL EXAMNIATION:  Gen: NAD, conversant, well nourised, obese, well groomed                     Cardiovascular: Regular rate rhythm, no peripheral edema, warm, nontender. Eyes: Conjunctivae clear without exudates or hemorrhage Neck: Supple, no carotid bruits. Pulmonary: Clear to auscultation bilaterally   NEUROLOGICAL EXAM:  MENTAL  STATUS: Speech/Cognition:  Cachexia, She sees in wheelchair, cooperative on examination,   CRANIAL NERVES: CN II: Visual fields are full to confrontation. Pupils are round equal and briskly reactive to light. CN III, IV, VI: extraocular movement are normal. No ptosis. CN V: Facial sensation is intact to pinprick in all 3 divisions bilaterally. Corneal responses are intact.  CN VII: Face is symmetric with normal eye closure and smile. CN VIII: Hearing is normal to rubbing fingers CN IX, X: Palate elevates symmetrically. Phonation is normal. CN XI: Head turning and shoulder shrug are intact CN XII: Tongue is midline with normal movements and no atrophy.  MOTOR:  Free movement of bilateral upper extremity, significant spasticity of bilateral lower extremity, no warning anterior movement, fixed contraction of bilateral knee, tendency for hip adduction  REFLEXES: Reflexes are 2+ and symmetric at the biceps, triceps, knees, and ankles. Plantar responses are flexor.  SENSORY: Intact to light touch, pinprick,  COORDINATION:  No dysmetria of finger-to-nose    GAIT/STANCE: Deferred   DIAGNOSTIC DATA (LABS, IMAGING, TESTING) - I reviewed patient records, labs, notes, testing and imaging myself where available.   ASSESSMENT AND PLAN  Rinoa Garramone is a 41 y.o. female   Relapsing remitting multiple sclerosis  Allergic reaction to Yehuda Budd virus was positive with titer of 3.48 on Jun 24 2016  Her disease was able to stabilize by previous rituximab infusion, 1000 units every 6 months, last infusion was at the [facility] in December 2016  She will be a good candidate for [facility]  Laboratory evaluations  Repeat MRI of the brain, cervical spine with and without contrast  Spastic paraplegia  EMG guided botulism toxin a injection  Start preauthorization for Xeomin 500 units    Marcial Pacas, M.D. Ph.D.  [facility] [address], [place], [place]  [address] Ph: [phone] Fax: [phone]  CC: [name], DO

## 2016-06-14 ENCOUNTER — Telehealth: Payer: Self-pay | Admitting: Neurology

## 2016-06-14 NOTE — Telephone Encounter (Signed)
Please follow up on her Ocrevus progress.

## 2016-06-15 ENCOUNTER — Non-Acute Institutional Stay (SKILLED_NURSING_FACILITY): Payer: Medicare Other | Admitting: Adult Health

## 2016-06-15 ENCOUNTER — Encounter: Payer: Self-pay | Admitting: Adult Health

## 2016-06-15 DIAGNOSIS — L89153 Pressure ulcer of sacral region, stage 3: Secondary | ICD-10-CM

## 2016-06-15 DIAGNOSIS — L89890 Pressure ulcer of other site, unstageable: Secondary | ICD-10-CM | POA: Diagnosis not present

## 2016-06-15 DIAGNOSIS — R000 Tachycardia, unspecified: Secondary | ICD-10-CM

## 2016-06-15 DIAGNOSIS — G8220 Paraplegia, unspecified: Secondary | ICD-10-CM | POA: Diagnosis not present

## 2016-06-15 DIAGNOSIS — G35 Multiple sclerosis: Secondary | ICD-10-CM

## 2016-06-15 DIAGNOSIS — D638 Anemia in other chronic diseases classified elsewhere: Secondary | ICD-10-CM

## 2016-06-15 DIAGNOSIS — L89150 Pressure ulcer of sacral region, unstageable: Secondary | ICD-10-CM | POA: Diagnosis not present

## 2016-06-15 DIAGNOSIS — E43 Unspecified severe protein-calorie malnutrition: Secondary | ICD-10-CM | POA: Diagnosis not present

## 2016-06-15 DIAGNOSIS — L89154 Pressure ulcer of sacral region, stage 4: Secondary | ICD-10-CM | POA: Diagnosis not present

## 2016-06-15 NOTE — Progress Notes (Signed)
Location:   [place] Room Number: [place] of Service:  SNF (31)   CODE STATUS: Full Code  No Known Allergies  Chief Complaint  Patient presents with  . Medical Management of Chronic Issues    1 month follow up    HPI:  She is a long term resident of this facility being seen for the management of her chronic illnesses. She continues to be followed by the wound doctor. She is being followed by neurology for her MS and will be receiving botox injections. She is not voicing any complaints at this time   Past Medical History:  Diagnosis Date  . Buttock wound 03/03/2016  . MS (multiple sclerosis) ([place])   . Pneumonia 02/2016  . Protein calorie malnutrition ([place])   . Severe sepsis ([place]) 03/03/2016  . UTI (urinary tract infection) 02/2016    Past Surgical History:  Procedure Laterality Date  . NO PAST SURGERIES      Social History   Social History  . Marital status: Single    Spouse name: N/A  . Number of children: N/A  . Years of education: N/A   Occupational History  . Disabled    Social History Main Topics  . Smoking status: Former Smoker    Packs/day: 1.00    Types: Cigarettes    Quit date: 02/01/2010  . Smokeless tobacco: Never Used  . Alcohol use No  . Drug use: No  . Sexual activity: Not on file   Other Topics Concern  . Not on file   Social History Narrative   She is a resident at [facility].   Right-handed.   Family History  Problem Relation Age of Onset  . Mental illness Sister       VITAL SIGNS BP (!) 108/58   Pulse (!) 113   Temp (!) 96.7 F (35.9 C)   Resp 16   Ht '5\' 9"'  (1.753 m)   Wt 85 lb 1.6 oz (38.6 kg)   SpO2 98%   BMI 12.57 kg/m   Patient's Medications  New Prescriptions   No medications on file  Previous Medications   ACETAMINOPHEN (TYLENOL) 325 MG TABLET    Give 325 mg  by mouth in the morning for chronic pain and 650 mg by mouth every 6 hours as needed for mild pain   AMINO ACIDS-PROTEIN  HYDROLYS (FEEDING SUPPLEMENT, PRO-STAT SUGAR FREE 64,) LIQD    Take 30 mLs by mouth 3 (three) times daily with meals.   BISACODYL (DULCOLAX) 5 MG EC TABLET    Take 1 tablet (5 mg total) by mouth daily as needed for moderate constipation.   FERROUS SULFATE 325 (65 FE) MG TABLET    Take 1 tablet (325 mg total) by mouth 2 (two) times daily with a meal.   MULTIPLE VITAMINS-MINERALS (DECUBI-VITE) CAPS    Take 1 capsule by mouth daily.   NUTRITIONAL SUPPLEMENTS (NUTRITIONAL SUPPLEMENT PO)    Take by mouth. House Supplement - Med Pass - Give 120cc by mouth two times daily   POLYETHYLENE GLYCOL (MIRALAX / GLYCOLAX) PACKET    Take 17 g by mouth daily.   VITAMIN C (ASCORBIC ACID) 500 MG TABLET    Take 500 mg by mouth 2 (two) times daily.  Modified Medications   No medications on file  Discontinued Medications   METOPROLOL TARTRATE (LOPRESSOR) 25 MG TABLET    Take 12.5 mg by mouth 2 (two) times daily.   NUTRITIONAL SUPPLEMENTS (NUTRITIONAL SUPPLEMENT PO)  Take by mouth. Give Nutritional Treats with meals daily for protein calorie malnutrition     SIGNIFICANT DIAGNOSTIC EXAMS  03-04-16: swallow study: recommend diet of dysphagia 3 solids, honey thick liquids (no straw), meds whole in puree, and cough/clear throat after every few bites/sips  03-07-16: chest x-ray: Increase left lung opacity concerning for worsening pneumonia or atelectasis with associated pleural effusion.   03-07-16: mri of pelvis: 1. Cellulitis and diffuse myofasciitis involving the pelvic and hip musculature. There also or rim enhancing fluid collections in the gluteus maximus muscles bilaterally concerning for pyomyositis. 2. No findings to suggest septic arthritis or osteomyelitis. 3. Shallow sacral decubitus ulcer but no underlying sacral osteomyelitis. 4. Markedly distended bladder.  03-07-16: mri of lumbar: 1. Negative for lumbar spine infection or impingement. 2. There is non organized edema in the subcutaneous fat. Negative for  abscess. 3. Urinary retention causing bilateral hydroureteronephrosis, also seen by CT in 2010.   03-12-16: bone marrow biopsy: Successful CT guided left iliac bone marrow aspiration and core biopsy. Note, an additional sample was set aside for Gram stain and culture analysis.  04-05-16: transvaginal and pelvic ultrasound: 1. Nonvisualization of the left ovary   2. Otherwise negative pelvic ultrasound  04-05-16: renal ultrasound: Mild right hydronephrosis.  04-05-16: chest x-ray: No evidence of pneumonia.  04-07-16: ct angio of chest: 1. No acute pulmonary embolus. 2. Tiny tree-in-bud densities in the right upper and lower lobes consistent with bronchiolitis. 3. 5 mm or less pulmonary nodular densities are also seen within the right upper and lower lobes possibly representing subpleural and perifissural lymph nodes. No follow-up needed if patient is low-risk (and has no known or suspected primary neoplasm). Non-contrast chest CT can be considered in 12 months if patient is high-risk.      LABS REVIEWED:   03-02-16: wbc 13.2; hgb 11.7; hct 37.8; mcv 92.0; plt 526; glucose 163; bun 12; creat 0.77; k+ 3.7; na++ 141; ast 113; alt 55; alk phos 118; total bili 1.7; albumin 2.2  03-05-16: wbc 2.9; hgb 8.8; hct 29.4; mcv 93.3; plt 360; glucose 110; bun 13; creat 0.87; k+ 3.5 ;na++ 158; mag 2.3 03-07-16: wbc 1.8; hgb 9.3; hct 31.0; mcv 92.3; plt 299; glucose 112; bun 9; creat 0.99; k+ 3.2 ;na++ 143; mag 2.0; urine culture: e-coli; blood cultures: neg; HIV nr 03-12-16: wbc 2.6; hgb 8.7; hct 29.0; mcv 89.8; plt 392; glucose 104; bun <5; creat 0.47; k+ 3.6; na++ 141 03-15-16: wbc 24.2; hgb 8.7; hct 28.9; mcv 90.6; plt 496; glucose 88; bun 6; creat 0.41; k+ 3.9; na++ 144 mag 1.8  03-19-16: wbc 12.3; hgb 10.7; hct 35.8; mcv 91.7; plt 513; glucose 112; bun 14.2; creat 0.40; k+ 4.7; na++149; alt 64; ast 73; albumin 3.2  04-05-16: wbc 10.9; hgb 10.3; hct 33.0; mcv 89.7; plt 632; glucose 92; bun 10; creat 0.58; k+ 3.7; na++ 141;  mag 1.7; urine culture: citrobacter freundii:cipro  04-06-16: wbc 9.0; hgb 8.2; hct 26.7; mcv 91.1; plt 532; glucose 89; bun 9; creat 0.41; k+ 3.2; na++ 143; liver normal albumin 2.1 04-07-16: wbc 7.9 ;hgb 7.0; hct 22.8; mcv 90.5; plt 517;glucose 82; bun 6; creat 0.38; k+ 4.2; na++ 142; iron 14; tibc 174; ferritin 276; folate 10.1; vit B 12: 423; tsh 1.741; d-dimer 3.67  04-08-16: wbc 6.3; hgb 8.4; hct 27.1; mcv 84.4 ;plt 526; glucose 87; bun 6; creat 0.34; k+ 3.8; na++ 139  05-12-16: wbc 9.8; hgb 10.3; hct 32.6; mcv 90.7; plt 497; glucose 92; bun 11.2; creat 0.54;  k+ 4.5; na++ 140; liver normal albumin 4.0;     Review of Systems  Constitutional: Negative for malaise/fatigue.  Respiratory: Negative for cough and shortness of breath.   Cardiovascular: Negative for chest pain, palpitations and leg swelling.  Gastrointestinal: Negative for abdominal pain, constipation and heartburn.  Genitourinary:       Has foley   Musculoskeletal: has generalized muscle aches; tylenol in am relieves .  Skin: Negative.        Has skin wounds   Neurological: Negative for dizziness.  Psychiatric/Behavioral: The patient is not nervous/anxious.      Physical Exam  Constitutional: She is oriented to person, place, and time. No distress.  Frail; malnurished   Eyes: Conjunctivae are normal.  Neck: Neck supple. No JVD present. No thyromegaly present.  Cardiovascular:regular rhythm and intact distal pulses.  is tachycardic  Respiratory: Effort normal and breath sounds normal. No respiratory distress. She has no wheezes.  GI: Soft. Bowel sounds are normal. She exhibits no distension. There is no tenderness.  Genitourinary:  Genitourinary Comments: Has foley   Musculoskeletal: She exhibits no edema.  Able to move upper extremities Bilateral lower extremities with splints in place    Lymphadenopathy:    She has no cervical adenopathy.  Neurological: She is alert and oriented to person, place, and time.  Skin: Skin  is warm and dry. She is not diaphoretic.  Left ischium: stage III 2.0 x 1.7 cm 25% granulation coccyx: stage IV: 5.4 x 4.1 x 0.7 cm undermining 2.4 cm at 9:00 Left posterior thigh: un-staged: 2.6 x 1.5 cm  Psychiatric: She has a normal mood and affect.    ASSESSMENT/ PLAN:   1. MS: is followed by neurology. Has bilateral lower extremity splints in place. She is allergic to Tysabri; she will begin rituximab 1000 units every 6 months through neurology; will begin botox injections and is awaiting an MRI of brain.    2.  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 Sacral decubitus stage III: will continue treatment per facility protocol; will be followed by wound Dr.  4. Neurogenic bladder; has long term foley   5. Protein calorie malnutrition: severe: albumin is 3.2 will continue supplements per facility protocol and will continue prostat 30 cc three times daily   6. Constipation: will continue miralax daily   7. Neutropenia: has had a bone biopsy; will follow up with oncology  8. Dysphagia: no further signs of aspiration; will continue current plan of care and will monitor  9. Anemia:  Likely related to chronic disease and iron deficiency: is status post transfusion 04/2016: hgb 11.2;   10. Tachycardia with low blood pressure: she did not tolerate toprol xl. Is being followed by cardiology will monitor .      MD is aware of resident's narcotic use and is in agreement with current plan of care. We will attempt to wean resident as apropriate    Ok Edwards NP [facility]  Contact [phone] Monday through Friday 8am- 5pm  After hours call [phone]

## 2016-06-15 NOTE — Telephone Encounter (Signed)
[facility] is waiting for her hepatitis results then can proceed with approval for Ocrevus.

## 2016-06-17 ENCOUNTER — Telehealth: Payer: Self-pay | Admitting: *Deleted

## 2016-06-17 NOTE — Telephone Encounter (Signed)
Patient was seen in the office on 06/08/16 and labs were ordered by Dr. Krista Blue.  The lab technician was unable to obtain a sample that day.  The patient was accompanied by an aid and a physical therpist from [facility].  They were asked to bring the patient back the following day.  I called [facility] [phone]) and spoke to Levada Dy, RN responsible for patient's care to inquire why patient has not been back yet.  She said the lab issue was never communicated to her. She is aware now that these labs must be completed to proceed with patient's Ocrevus approval.  She will have the patient transported on Monday, 06/28/16 for labs.  I also reviewed her other pending appt times with Levada Dy.

## 2016-06-21 NOTE — Telephone Encounter (Signed)
Noted thank you

## 2016-06-22 ENCOUNTER — Non-Acute Institutional Stay (SKILLED_NURSING_FACILITY): Payer: Medicare Other | Admitting: Adult Health

## 2016-06-22 ENCOUNTER — Encounter: Payer: Self-pay | Admitting: Adult Health

## 2016-06-22 DIAGNOSIS — G35 Multiple sclerosis: Secondary | ICD-10-CM | POA: Diagnosis not present

## 2016-06-22 DIAGNOSIS — R634 Abnormal weight loss: Secondary | ICD-10-CM | POA: Diagnosis not present

## 2016-06-22 NOTE — Progress Notes (Signed)
Location:   [place] Room Number: [place] of Service:  SNF (31)   CODE STATUS: Full Code   No Known Allergies  Chief Complaint  Patient presents with  . Acute Visit    Wound Management / Weight Loss    HPI:  She is continuing to lose weight. From 98 pounds in March 2018 to her current weight of 82 pounds. She tells me that she does not feel as though she has lost weight. Staff reports that she is eating. She does have significant wounds as well; which could be contributing to her weight loss.    Past Medical History:  Diagnosis Date  . Buttock wound 03/03/2016  . MS (multiple sclerosis) ([place])   . Pneumonia 02/2016  . Protein calorie malnutrition ([place])   . Severe sepsis ([place]) 03/03/2016  . UTI (urinary tract infection) 02/2016    Past Surgical History:  Procedure Laterality Date  . NO PAST SURGERIES      Social History   Social History  . Marital status: Single    Spouse name: N/A  . Number of children: N/A  . Years of education: N/A   Occupational History  . Disabled    Social History Main Topics  . Smoking status: Former Smoker    Packs/day: 1.00    Types: Cigarettes    Quit date: 02/01/2010  . Smokeless tobacco: Never Used  . Alcohol use No  . Drug use: No  . Sexual activity: Not on file   Other Topics Concern  . Not on file   Social History Narrative   She is a resident at [facility].   Right-handed.   Family History  Problem Relation Age of Onset  . Mental illness Sister       VITAL SIGNS BP (!) 108/58   Pulse (!) 113   Temp (!) 96.7 F (35.9 C)   Resp 16   Ht '5\' 9"'  (1.753 m)   Wt 87 lb 3.2 oz (39.6 kg)   SpO2 98%   BMI 12.88 kg/m   Patient's Medications  New Prescriptions   No medications on file  Previous Medications   ACETAMINOPHEN (TYLENOL) 325 MG TABLET    Give 650 by mouth in the morning for chronic pain and 650 mg by mouth every 6 hours as needed for mild pain   AMINO ACIDS-PROTEIN HYDROLYS  (FEEDING SUPPLEMENT, PRO-STAT SUGAR FREE 64,) LIQD    Take 30 mLs by mouth 3 (three) times daily with meals.   BISACODYL (DULCOLAX) 5 MG EC TABLET    Take 1 tablet (5 mg total) by mouth daily as needed for moderate constipation.   FERROUS SULFATE 325 (65 FE) MG TABLET    Take 1 tablet (325 mg total) by mouth 2 (two) times daily with a meal.   MULTIPLE VITAMINS-MINERALS (DECUBI-VITE) CAPS    Take 1 capsule by mouth daily.   NUTRITIONAL SUPPLEMENTS (NUTRITIONAL SUPPLEMENT PO)    Take by mouth. House Supplement - Med Pass - Give 120cc by mouth two times daily   POLYETHYLENE GLYCOL (MIRALAX / GLYCOLAX) PACKET    Take 17 g by mouth daily.  Modified Medications   No medications on file  Discontinued Medications   No medications on file     SIGNIFICANT DIAGNOSTIC EXAMS   03-04-16: swallow study: recommend diet of dysphagia 3 solids, honey thick liquids (no straw), meds whole in puree, and cough/clear throat after every few bites/sips  03-07-16: chest x-ray: Increase left lung opacity  concerning for worsening pneumonia or atelectasis with associated pleural effusion.   03-07-16: mri of pelvis: 1. Cellulitis and diffuse myofasciitis involving the pelvic and hip musculature. There also or rim enhancing fluid collections in the gluteus maximus muscles bilaterally concerning for pyomyositis. 2. No findings to suggest septic arthritis or osteomyelitis. 3. Shallow sacral decubitus ulcer but no underlying sacral osteomyelitis. 4. Markedly distended bladder.  03-07-16: mri of lumbar: 1. Negative for lumbar spine infection or impingement. 2. There is non organized edema in the subcutaneous fat. Negative for abscess. 3. Urinary retention causing bilateral hydroureteronephrosis, also seen by CT in 2010.   03-12-16: bone marrow biopsy: Successful CT guided left iliac bone marrow aspiration and core biopsy. Note, an additional sample was set aside for Gram stain and culture analysis.  04-05-16: transvaginal and pelvic  ultrasound: 1. Nonvisualization of the left ovary   2. Otherwise negative pelvic ultrasound  04-05-16: renal ultrasound: Mild right hydronephrosis.  04-05-16: chest x-ray: No evidence of pneumonia.  04-07-16: ct angio of chest: 1. No acute pulmonary embolus. 2. Tiny tree-in-bud densities in the right upper and lower lobes consistent with bronchiolitis. 3. 5 mm or less pulmonary nodular densities are also seen within the right upper and lower lobes possibly representing subpleural and perifissural lymph nodes. No follow-up needed if patient is low-risk (and has no known or suspected primary neoplasm). Non-contrast chest CT can be considered in 12 months if patient is high-risk.      LABS REVIEWED:   03-02-16: wbc 13.2; hgb 11.7; hct 37.8; mcv 92.0; plt 526; glucose 163; bun 12; creat 0.77; k+ 3.7; na++ 141; ast 113; alt 55; alk phos 118; total bili 1.7; albumin 2.2  03-05-16: wbc 2.9; hgb 8.8; hct 29.4; mcv 93.3; plt 360; glucose 110; bun 13; creat 0.87; k+ 3.5 ;na++ 158; mag 2.3 03-07-16: wbc 1.8; hgb 9.3; hct 31.0; mcv 92.3; plt 299; glucose 112; bun 9; creat 0.99; k+ 3.2 ;na++ 143; mag 2.0; urine culture: e-coli; blood cultures: neg; HIV nr 03-12-16: wbc 2.6; hgb 8.7; hct 29.0; mcv 89.8; plt 392; glucose 104; bun <5; creat 0.47; k+ 3.6; na++ 141 03-15-16: wbc 24.2; hgb 8.7; hct 28.9; mcv 90.6; plt 496; glucose 88; bun 6; creat 0.41; k+ 3.9; na++ 144 mag 1.8  03-19-16: wbc 12.3; hgb 10.7; hct 35.8; mcv 91.7; plt 513; glucose 112; bun 14.2; creat 0.40; k+ 4.7; na++149; alt 64; ast 73; albumin 3.2  04-05-16: wbc 10.9; hgb 10.3; hct 33.0; mcv 89.7; plt 632; glucose 92; bun 10; creat 0.58; k+ 3.7; na++ 141; mag 1.7; urine culture: citrobacter freundii:cipro  04-06-16: wbc 9.0; hgb 8.2; hct 26.7; mcv 91.1; plt 532; glucose 89; bun 9; creat 0.41; k+ 3.2; na++ 143; liver normal albumin 2.1 04-07-16: wbc 7.9 ;hgb 7.0; hct 22.8; mcv 90.5; plt 517;glucose 82; bun 6; creat 0.38; k+ 4.2; na++ 142; iron 14; tibc 174; ferritin  276; folate 10.1; vit B 12: 423; tsh 1.741; d-dimer 3.67  04-08-16: wbc 6.3; hgb 8.4; hct 27.1; mcv 84.4 ;plt 526; glucose 87; bun 6; creat 0.34; k+ 3.8; na++ 139  05-12-16: wbc 9.8; hgb 10.3; hct 32.6; mcv 90.7; plt 497; glucose 92; bun 11.2; creat 0.54; k+ 4.5; na++ 140; liver normal albumin 4.0;     Review of Systems  Constitutional: Negative for malaise/fatigue.  Respiratory: Negative for cough and shortness of breath.   Cardiovascular: Negative for chest pain, palpitations and leg swelling.  Gastrointestinal: Negative for abdominal pain, constipation and heartburn.  Genitourinary:  Has foley   Musculoskeletal: has generalized muscle aches; tylenol in am relieves .  Skin: Negative.        Has skin wounds   Neurological: Negative for dizziness.  Psychiatric/Behavioral: The patient is not nervous/anxious.      Physical Exam  Constitutional: She is oriented to person, place, and time. No distress.  Frail; malnurished   Eyes: Conjunctivae are normal.  Neck: Neck supple. No JVD present. No thyromegaly present.  Cardiovascular:regular rhythm and intact distal pulses.  is tachycardic  Respiratory: Effort normal and breath sounds normal. No respiratory distress. She has no wheezes.  GI: Soft. Bowel sounds are normal. She exhibits no distension. There is no tenderness.  Genitourinary:  Genitourinary Comments: Has foley   Musculoskeletal: She exhibits no edema.  Able to move upper extremities Bilateral lower extremities with splints in place    Lymphadenopathy:    She has no cervical adenopathy.  Neurological: She is alert and oriented to person, place, and time.  Skin: Skin is warm and dry. She is not diaphoretic.  Left ischium: stage III 2.0 x 1.7 cm 25% granulation coccyx: stage IV: 5.4 x 4.1 x 0.7 cm undermining 2.4 cm at 9:00 Left posterior thigh: un-staged: 2.6 x 1.5 cm  Psychiatric: She has a normal mood and affect.    ASSESSMENT/ PLAN:  1. relapsing remitting  multiple sclerosis 2. Unintentional weight loss  Will increase med pass to 120 cc three times daily  Will begin vitamin c 500 mg twice daily  Will check cbc cmp; pre-albumin tsh vit B 12; folate    Time spent with patient   40 minutes >50% time spent counseling; reviewing medical record; tests; labs; and developing future plan of care   MD is aware of resident's narcotic use and is in agreement with current plan of care. We will attempt to wean resident as apropriate   Ok Edwards NP [facility]  Contact [phone] Monday through Friday 8am- 5pm  After hours call [phone]

## 2016-06-23 ENCOUNTER — Non-Acute Institutional Stay (SKILLED_NURSING_FACILITY): Payer: Medicare Other | Admitting: Adult Health

## 2016-06-23 ENCOUNTER — Encounter: Payer: Self-pay | Admitting: Adult Health

## 2016-06-23 DIAGNOSIS — R945 Abnormal results of liver function studies: Secondary | ICD-10-CM

## 2016-06-23 DIAGNOSIS — R000 Tachycardia, unspecified: Secondary | ICD-10-CM

## 2016-06-23 DIAGNOSIS — E538 Deficiency of other specified B group vitamins: Secondary | ICD-10-CM | POA: Diagnosis not present

## 2016-06-23 DIAGNOSIS — R7989 Other specified abnormal findings of blood chemistry: Secondary | ICD-10-CM

## 2016-06-23 LAB — HEPATIC FUNCTION PANEL
ALT: 63 U/L — AB (ref 7–35)
AST: 44 U/L — AB (ref 13–35)
Alkaline Phosphatase: 219 U/L — AB (ref 25–125)
BILIRUBIN, TOTAL: 0.2 mg/dL

## 2016-06-23 LAB — CBC AND DIFFERENTIAL
HCT: 31 % — AB (ref 36–46)
Hemoglobin: 9.4 g/dL — AB (ref 12.0–16.0)
NEUTROS ABS: 10 /uL
PLATELETS: 629 10*3/uL — AB (ref 150–399)
WBC: 12.1 10^3/mL

## 2016-06-23 LAB — TSH: TSH: 2.28 u[IU]/mL (ref 0.41–5.90)

## 2016-06-23 LAB — BASIC METABOLIC PANEL
BUN: 12 mg/dL (ref 4–21)
Creatinine: 0.2 mg/dL — AB (ref 0.5–1.1)
GLUCOSE: 150 mg/dL
Potassium: 3.9 mmol/L (ref 3.4–5.3)
Sodium: 139 mmol/L (ref 137–147)

## 2016-06-23 LAB — VITAMIN B12: VITAMIN B 12: 287

## 2016-06-23 NOTE — Progress Notes (Signed)
Location:   [facility] Room Number: 8938 B Place of Service:  SNF (31)   CODE STATUS: Full Code  No Known Allergies  Chief Complaint  Patient presents with  . Acute Visit    Lab Follow up    HPI:  Her liver enzymes are elevate with a slightly elevated white count. Her blood pressure remains low with an elevated pulse rate. She and I have discussed her overall status with the stress that the tachycardia is taking on her heart. She did verbalize understanding and has agreed to return back to her cardiologist.  I did discuss with her the elevated liver enzymes and the need for further testing and she is in agreement for an ultrasound.    Past Medical History:  Diagnosis Date  . Buttock wound 03/03/2016  . MS (multiple sclerosis) ([place])   . Pneumonia 02/2016  . Protein calorie malnutrition ([place])   . Severe sepsis ([place]) 03/03/2016  . UTI (urinary tract infection) 02/2016    Past Surgical History:  Procedure Laterality Date  . NO PAST SURGERIES      Social History   Social History  . Marital status: Single    Spouse name: N/A  . Number of children: N/A  . Years of education: N/A   Occupational History  . Disabled    Social History Main Topics  . Smoking status: Former Smoker    Packs/day: 1.00    Types: Cigarettes    Quit date: 02/01/2010  . Smokeless tobacco: Never Used  . Alcohol use No  . Drug use: No  . Sexual activity: Not on file   Other Topics Concern  . Not on file   Social History Narrative   She is a resident at [facility].   Right-handed.   Family History  Problem Relation Age of Onset  . Mental illness Sister       VITAL SIGNS BP 136/84   Pulse 76   Temp 97.4 F (36.3 C)   Resp 18   Ht _0  (1.753 m)   Wt 87 lb 3.2 oz (39.6 kg)   BMI 12.88 kg/m   Patient's Medications  New Prescriptions   No medications on file  Previous Medications   ACETAMINOPHEN (TYLENOL) 325 MG TABLET    Give 325 mg  by mouth in the  morning for chronic pain and 650 mg by mouth every 6 hours as needed for mild pain   AMINO ACIDS-PROTEIN HYDROLYS (FEEDING SUPPLEMENT, PRO-STAT SUGAR FREE 64,) LIQD    Take 30 mLs by mouth 3 (three) times daily with meals.   BISACODYL (DULCOLAX) 5 MG EC TABLET    Take 1 tablet (5 mg total) by mouth daily as needed for moderate constipation.   FERROUS SULFATE 325 (65 FE) MG TABLET    Take 1 tablet (325 mg total) by mouth 2 (two) times daily with a meal.   MULTIPLE VITAMINS-MINERALS (DECUBI-VITE) CAPS    Take 1 capsule by mouth daily.   NUTRITIONAL SUPPLEMENTS (NUTRITIONAL SUPPLEMENT PO)    Take by mouth. House Supplement - Med Pass - Give 120cc by mouth two times daily   POLYETHYLENE GLYCOL (MIRALAX / GLYCOLAX) PACKET    Take 17 g by mouth daily.   VITAMIN C (ASCORBIC ACID) 500 MG TABLET    Take 500 mg by mouth 2 (two) times daily.  Modified Medications   No medications on file  Discontinued Medications   No medications on file     SIGNIFICANT DIAGNOSTIC  EXAMS  03-04-16: swallow study: recommend diet of dysphagia 3 solids, honey thick liquids (no straw), meds whole in puree, and cough/clear throat after every few bites/sips  03-07-16: chest x-ray: Increase left lung opacity concerning for worsening pneumonia or atelectasis with associated pleural effusion.   03-07-16: mri of pelvis: 1. Cellulitis and diffuse myofasciitis involving the pelvic and hip musculature. There also or rim enhancing fluid collections in the gluteus maximus muscles bilaterally concerning for pyomyositis. 2. No findings to suggest septic arthritis or osteomyelitis. 3. Shallow sacral decubitus ulcer but no underlying sacral osteomyelitis. 4. Markedly distended bladder.  03-07-16: mri of lumbar: 1. Negative for lumbar spine infection or impingement. 2. There is non organized edema in the subcutaneous fat. Negative for abscess. 3. Urinary retention causing bilateral hydroureteronephrosis, also seen by CT in 2010.   03-12-16:  bone marrow biopsy: Successful CT guided left iliac bone marrow aspiration and core biopsy. Note, an additional sample was set aside for Gram stain and culture analysis.  04-05-16: transvaginal and pelvic ultrasound: 1. Nonvisualization of the left ovary   2. Otherwise negative pelvic ultrasound  04-05-16: renal ultrasound: Mild right hydronephrosis.  04-05-16: chest x-ray: No evidence of pneumonia.  04-07-16: ct angio of chest: 1. No acute pulmonary embolus. 2. Tiny tree-in-bud densities in the right upper and lower lobes consistent with bronchiolitis. 3. 5 mm or less pulmonary nodular densities are also seen within the right upper and lower lobes possibly representing subpleural and perifissural lymph nodes. No follow-up needed if patient is low-risk (and has no known or suspected primary neoplasm). Non-contrast chest CT can be considered in 12 months if patient is high-risk.    06-03-16: TEE: - The patient was in sinus tachycardia. Normal LV size with EF 45-50%, diffuse hypokinesis. Normal RV size with mildly decreased systolic function. No significant valvular abnormalities.    LABS REVIEWED:   03-02-16: wbc 13.2; hgb 11.7; hct 37.8; mcv 92.0; plt 526; glucose 163; bun 12; creat 0.77; k+ 3.7; na++ 141; ast 113; alt 55; alk phos 118; total bili 1.7; albumin 2.2  03-05-16: wbc 2.9; hgb 8.8; hct 29.4; mcv 93.3; plt 360; glucose 110; bun 13; creat 0.87; k+ 3.5 ;na++ 158; mag 2.3 03-07-16: wbc 1.8; hgb 9.3; hct 31.0; mcv 92.3; plt 299; glucose 112; bun 9; creat 0.99; k+ 3.2 ;na++ 143; mag 2.0; urine culture: e-coli; blood cultures: neg; HIV nr 03-12-16: wbc 2.6; hgb 8.7; hct 29.0; mcv 89.8; plt 392; glucose 104; bun <5; creat 0.47; k+ 3.6; na++ 141 03-15-16: wbc 24.2; hgb 8.7; hct 28.9; mcv 90.6; plt 496; glucose 88; bun 6; creat 0.41; k+ 3.9; na++ 144 mag 1.8  03-19-16: wbc 12.3; hgb 10.7; hct 35.8; mcv 91.7; plt 513; glucose 112; bun 14.2; creat 0.40; k+ 4.7; na++149; alt 64; ast 73; albumin 3.2  04-05-16: wbc  10.9; hgb 10.3; hct 33.0; mcv 89.7; plt 632; glucose 92; bun 10; creat 0.58; k+ 3.7; na++ 141; mag 1.7; urine culture: citrobacter freundii:cipro  04-06-16: wbc 9.0; hgb 8.2; hct 26.7; mcv 91.1; plt 532; glucose 89; bun 9; creat 0.41; k+ 3.2; na++ 143; liver normal albumin 2.1 04-07-16: wbc 7.9 ;hgb 7.0; hct 22.8; mcv 90.5; plt 517;glucose 82; bun 6; creat 0.38; k+ 4.2; na++ 142; iron 14; tibc 174; ferritin 276; folate 10.1; vit B 12: 423; tsh 1.741; d-dimer 3.67  04-08-16: wbc 6.3; hgb 8.4; hct 27.1; mcv 84.4 ;plt 526; glucose 87; bun 6; creat 0.34; k+ 3.8; na++ 139  05-12-16: wbc 9.8; hgb 10.3; hct  32.6; mcv 90.7; plt 497; glucose 92; bun 11.2; creat 0.54; k+ 4.5; na++ 140; liver normal albumin 4.0;  06-23-16: wbc 12.1; hgb 9.4; hct 31.2; mcv 87.3; plt 629; glucose 150; bun 11.6; creat 0.22; k+ 3.9; na++ 139 alt 63; ast 44; alk phos 219; albumin 3.0; vit B 12: 530; folic acid 05.1; pre-albumin 9; tsh 2.28      Review of Systems  Constitutional: Negative for malaise/fatigue.  Respiratory: Negative for cough and shortness of breath.   Cardiovascular: Negative for chest pain, palpitations and leg swelling.  Gastrointestinal: Negative for abdominal pain, constipation and heartburn.  Genitourinary:       Has foley   Musculoskeletal: has generalized muscle aches; tylenol in am relieves .  Skin: Negative.        Has skin wounds   Neurological: Negative for dizziness.  Psychiatric/Behavioral: The patient is not nervous/anxious.      Physical Exam  Constitutional: She is oriented to person, place, and time. No distress.  Frail; malnurished   Eyes: Conjunctivae are normal.  Neck: Neck supple. No JVD present. No thyromegaly present.  Cardiovascular:regular rhythm and intact distal pulses.  is tachycardic  Respiratory: Effort normal and breath sounds normal. No respiratory distress. She has no wheezes.  GI: Soft. Bowel sounds are normal. She exhibits no distension. There is no tenderness.    Genitourinary:  Genitourinary Comments: Has foley   Musculoskeletal: She exhibits no edema.  Able to move upper extremities Bilateral lower extremities with splints in place    Lymphadenopathy:    She has no cervical adenopathy.  Neurological: She is alert and oriented to person, place, and time.  Skin: Skin is warm and dry. She is not diaphoretic.  Left ischium: stage III 2.0 x 1.7 cm 25% granulation coccyx: stage IV: 5.4 x 4.1 x 0.7 cm undermining 2.4 cm at 9:00 Left posterior thigh: un-staged: 2.6 x 1.5 cm  Psychiatric: She has a normal mood and affect.    ASSESSMENT/ PLAN:  1. Elevated liver enzymes 2. Vit B 12 deficiency 3. Tachycardia  Will begin vit B 12 1000 mcg daily  Will check hgb a1c  Will get a liver ultrasound  Will have her return back to cardiology for follow up from her TEE   Time spent with patient 45   minutes >50% time spent counseling; reviewing medical record; tests; labs; and developing future plan of care   MD is aware of resident's narcotic use and is in agreement with current plan of care. We will attempt to wean resident as apropriate   Ok Edwards NP [facility]  Contact [phone] Monday through Friday 8am- 5pm  After hours call [phone]

## 2016-06-24 ENCOUNTER — Encounter: Payer: Self-pay | Admitting: Adult Health

## 2016-06-24 ENCOUNTER — Non-Acute Institutional Stay (SKILLED_NURSING_FACILITY): Payer: Medicare Other | Admitting: Adult Health

## 2016-06-24 ENCOUNTER — Other Ambulatory Visit (INDEPENDENT_AMBULATORY_CARE_PROVIDER_SITE_OTHER): Payer: Self-pay

## 2016-06-24 DIAGNOSIS — R634 Abnormal weight loss: Secondary | ICD-10-CM | POA: Diagnosis not present

## 2016-06-24 DIAGNOSIS — G35 Multiple sclerosis: Secondary | ICD-10-CM | POA: Diagnosis not present

## 2016-06-24 DIAGNOSIS — Z0289 Encounter for other administrative examinations: Secondary | ICD-10-CM

## 2016-06-24 DIAGNOSIS — R630 Anorexia: Secondary | ICD-10-CM | POA: Diagnosis not present

## 2016-06-24 DIAGNOSIS — E43 Unspecified severe protein-calorie malnutrition: Secondary | ICD-10-CM | POA: Diagnosis not present

## 2016-06-24 LAB — HEMOGLOBIN A1C: Hemoglobin A1C: 5.5

## 2016-06-24 NOTE — Progress Notes (Signed)
Location:   [facility] Room Number: 215 A Place of Service:  SNF (31)   CODE STATUS: Full Code  No Known Allergies  Chief Complaint  Patient presents with  . Acute Visit    Malnutrition    HPI:  She is continuing to lose weight despite interventions in place. She tells me that she is eating. She and I have discussed her condition. I told her that her body does like this weight loss; she is tachycardic low blood pressure are all affecting her health. We discussed the possibility of placing a peg tube for a given period of time to build her up nutritionally. She has agreed to this plan only if the peg tube is temporary.   Past Medical History:  Diagnosis Date  . Buttock wound 03/03/2016  . MS (multiple sclerosis) ([place])   . Pneumonia 02/2016  . Protein calorie malnutrition ([place])   . Severe sepsis ([place]) 03/03/2016  . UTI (urinary tract infection) 02/2016    Past Surgical History:  Procedure Laterality Date  . NO PAST SURGERIES      Social History   Social History  . Marital status: Single    Spouse name: N/A  . Number of children: N/A  . Years of education: N/A   Occupational History  . Disabled    Social History Main Topics  . Smoking status: Former Smoker    Packs/day: 1.00    Types: Cigarettes    Quit date: 02/01/2010  . Smokeless tobacco: Never Used  . Alcohol use No  . Drug use: No  . Sexual activity: Not on file   Other Topics Concern  . Not on file   Social History Narrative   She is a resident at [facility].   Right-handed.   Family History  Problem Relation Age of Onset  . Mental illness Sister       VITAL SIGNS BP (!) 108/58   Pulse (!) 113   Temp (!) 96.7 F (35.9 C)   Resp 16   Ht '5\' 9"'  (1.753 m)   Wt 87 lb (39.5 kg)   SpO2 98%   BMI 12.85 kg/m   Patient's Medications  New Prescriptions   No medications on file  Previous Medications   ACETAMINOPHEN (TYLENOL) 325 MG TABLET    Give 325 mg  by mouth in  the morning for chronic pain and 650 mg by mouth every 6 hours as needed for mild pain   AMINO ACIDS-PROTEIN HYDROLYS (FEEDING SUPPLEMENT, PRO-STAT SUGAR FREE 64,) LIQD    Take 30 mLs by mouth 3 (three) times daily with meals.   BISACODYL (DULCOLAX) 5 MG EC TABLET    Take 1 tablet (5 mg total) by mouth daily as needed for moderate constipation.   CYANOCOBALAMIN 1000 MCG TABLET    Take 1,000 mcg by mouth daily.   FERROUS SULFATE 325 (65 FE) MG TABLET    Take 1 tablet (325 mg total) by mouth 2 (two) times daily with a meal.   MULTIPLE VITAMINS-MINERALS (DECUBI-VITE) CAPS    Take 1 capsule by mouth daily.   NUTRITIONAL SUPPLEMENTS (NUTRITIONAL SUPPLEMENT PO)    Take by mouth. House Supplement - Med Pass - Give 120cc by mouth three times daily   POLYETHYLENE GLYCOL (MIRALAX / GLYCOLAX) PACKET    Take 17 g by mouth daily.   VITAMIN C (ASCORBIC ACID) 500 MG TABLET    Take 500 mg by mouth 2 (two) times daily.  Modified Medications  No medications on file  Discontinued Medications   No medications on file     SIGNIFICANT DIAGNOSTIC EXAMS   03-04-16: swallow study: recommend diet of dysphagia 3 solids, honey thick liquids (no straw), meds whole in puree, and cough/clear throat after every few bites/sips  03-07-16: chest x-ray: Increase left lung opacity concerning for worsening pneumonia or atelectasis with associated pleural effusion.   03-07-16: mri of pelvis: 1. Cellulitis and diffuse myofasciitis involving the pelvic and hip musculature. There also or rim enhancing fluid collections in the gluteus maximus muscles bilaterally concerning for pyomyositis. 2. No findings to suggest septic arthritis or osteomyelitis. 3. Shallow sacral decubitus ulcer but no underlying sacral osteomyelitis. 4. Markedly distended bladder.  03-07-16: mri of lumbar: 1. Negative for lumbar spine infection or impingement. 2. There is non organized edema in the subcutaneous fat. Negative for abscess. 3. Urinary retention  causing bilateral hydroureteronephrosis, also seen by CT in 2010.   03-12-16: bone marrow biopsy: Successful CT guided left iliac bone marrow aspiration and core biopsy. Note, an additional sample was set aside for Gram stain and culture analysis.  04-05-16: transvaginal and pelvic ultrasound: 1. Nonvisualization of the left ovary   2. Otherwise negative pelvic ultrasound  04-05-16: renal ultrasound: Mild right hydronephrosis.  04-05-16: chest x-ray: No evidence of pneumonia.  04-07-16: ct angio of chest: 1. No acute pulmonary embolus. 2. Tiny tree-in-bud densities in the right upper and lower lobes consistent with bronchiolitis. 3. 5 mm or less pulmonary nodular densities are also seen within the right upper and lower lobes possibly representing subpleural and perifissural lymph nodes. No follow-up needed if patient is low-risk (and has no known or suspected primary neoplasm). Non-contrast chest CT can be considered in 12 months if patient is high-risk.    06-03-16: TEE: - The patient was in sinus tachycardia. Normal LV size with EF 45-50%, diffuse hypokinesis. Normal RV size with mildly decreased systolic function. No significant valvular abnormalities.    LABS REVIEWED:   03-02-16: wbc 13.2; hgb 11.7; hct 37.8; mcv 92.0; plt 526; glucose 163; bun 12; creat 0.77; k+ 3.7; na++ 141; ast 113; alt 55; alk phos 118; total bili 1.7; albumin 2.2  03-05-16: wbc 2.9; hgb 8.8; hct 29.4; mcv 93.3; plt 360; glucose 110; bun 13; creat 0.87; k+ 3.5 ;na++ 158; mag 2.3 03-07-16: wbc 1.8; hgb 9.3; hct 31.0; mcv 92.3; plt 299; glucose 112; bun 9; creat 0.99; k+ 3.2 ;na++ 143; mag 2.0; urine culture: e-coli; blood cultures: neg; HIV nr 03-12-16: wbc 2.6; hgb 8.7; hct 29.0; mcv 89.8; plt 392; glucose 104; bun <5; creat 0.47; k+ 3.6; na++ 141 03-15-16: wbc 24.2; hgb 8.7; hct 28.9; mcv 90.6; plt 496; glucose 88; bun 6; creat 0.41; k+ 3.9; na++ 144 mag 1.8  03-19-16: wbc 12.3; hgb 10.7; hct 35.8; mcv 91.7; plt 513; glucose 112; bun  14.2; creat 0.40; k+ 4.7; na++149; alt 64; ast 73; albumin 3.2  04-05-16: wbc 10.9; hgb 10.3; hct 33.0; mcv 89.7; plt 632; glucose 92; bun 10; creat 0.58; k+ 3.7; na++ 141; mag 1.7; urine culture: citrobacter freundii:cipro  04-06-16: wbc 9.0; hgb 8.2; hct 26.7; mcv 91.1; plt 532; glucose 89; bun 9; creat 0.41; k+ 3.2; na++ 143; liver normal albumin 2.1 04-07-16: wbc 7.9 ;hgb 7.0; hct 22.8; mcv 90.5; plt 517;glucose 82; bun 6; creat 0.38; k+ 4.2; na++ 142; iron 14; tibc 174; ferritin 276; folate 10.1; vit B 12: 423; tsh 1.741; d-dimer 3.67  04-08-16: wbc 6.3; hgb 8.4; hct 27.1; mcv  84.4 ;plt 526; glucose 87; bun 6; creat 0.34; k+ 3.8; na++ 139  05-12-16: wbc 9.8; hgb 10.3; hct 32.6; mcv 90.7; plt 497; glucose 92; bun 11.2; creat 0.54; k+ 4.5; na++ 140; liver normal albumin 4.0;  06-23-16: wbc 12.1; hgb 9.4; hct 31.2; mcv 87.3; plt 629; glucose 150; bun 11.6; creat 0.22; k+ 3.9; na++ 139 alt 63; ast 44; alk phos 219; albumin 3.0; vit B 12: 224; folic acid 49.7; pre-albumin 9; tsh 2.28      Review of Systems  Constitutional: Negative for malaise/fatigue.  Respiratory: Negative for cough and shortness of breath.   Cardiovascular: Negative for chest pain, palpitations and leg swelling.  Gastrointestinal: Negative for abdominal pain, constipation and heartburn.  Genitourinary:       Has foley   Musculoskeletal: has generalized muscle aches; tylenol in am relieves .  Skin: Negative.        Has skin wounds   Neurological: Negative for dizziness.  Psychiatric/Behavioral: The patient is not nervous/anxious.      Physical Exam  Constitutional: She is oriented to person, place, and time. No distress.  Frail; malnurished   Eyes: Conjunctivae are normal.  Neck: Neck supple. No JVD present. No thyromegaly present.  Cardiovascular:regular rhythm and intact distal pulses.  is tachycardic  Respiratory: Effort normal and breath sounds normal. No respiratory distress. She has no wheezes.  GI: Soft. Bowel sounds  are normal. She exhibits no distension. There is no tenderness.  Genitourinary:  Genitourinary Comments: Has foley   Musculoskeletal: She exhibits no edema.  Able to move upper extremities Bilateral lower extremities with splints in place    Lymphadenopathy:    She has no cervical adenopathy.  Neurological: She is alert and oriented to person, place, and time.  Skin: Skin is warm and dry. She is not diaphoretic.  Left ischium: stage III 2.0 x 1.7 cm 25% granulation coccyx: stage IV: 5.4 x 4.1 x 0.7 cm undermining 2.4 cm at 9:00 Left posterior thigh: un-staged: 2.6 x 1.5 cm  Psychiatric: She has a normal mood and affect.     ASSESSMENT/ PLAN:  1. MS 2. Weight loss 3. Severe malnutrition  Will set her up for a peg tube placement for a period of no longer than 6 months in order to build up her nutritional status.    Time spent with patient  45  minutes >50% time spent counseling; reviewing medical record; tests; labs; and developing future plan of care   MD is aware of resident's narcotic use and is in agreement with current plan of care. We will attempt to wean resident as apropriate   Ok Edwards NP [facility]  Contact [phone] Monday through Friday 8am- 5pm  After hours call [phone]

## 2016-06-25 ENCOUNTER — Telehealth: Payer: Self-pay | Admitting: Neurology

## 2016-06-25 LAB — VARICELLA ZOSTER ANTIBODY, IGG: Varicella zoster IgG: 2342 index (ref >165)

## 2016-06-25 LAB — CBC WITH DIFFERENTIAL
BASOS ABS: 0 10*3/uL (ref 0.0–0.2)
Basos: 0 %
EOS (ABSOLUTE): 0.2 10*3/uL (ref 0.0–0.4)
Eos: 1 %
Hematocrit: 30.9 % — ABNORMAL LOW (ref 34.0–46.6)
Hemoglobin: 9.7 g/dL — ABNORMAL LOW (ref 11.1–15.9)
IMMATURE GRANS (ABS): 0 10*3/uL (ref 0.0–0.1)
Immature Granulocytes: 0 %
LYMPHS: 10 %
Lymphocytes Absolute: 1.2 10*3/uL (ref 0.7–3.1)
MCH: 26.3 pg — AB (ref 26.6–33.0)
MCHC: 31.4 g/dL — AB (ref 31.5–35.7)
MCV: 84 fL (ref 79–97)
MONOS ABS: 0.6 10*3/uL (ref 0.1–0.9)
Monocytes: 5 %
NEUTROS ABS: 9.9 10*3/uL — AB (ref 1.4–7.0)
NEUTROS PCT: 84 %
RBC: 3.69 x10E6/uL — ABNORMAL LOW (ref 3.77–5.28)
RDW: 14.8 % (ref 12.3–15.4)
WBC: 12 10*3/uL — ABNORMAL HIGH (ref 3.4–10.8)

## 2016-06-25 LAB — HEPATITIS C ANTIBODY

## 2016-06-25 LAB — HEPATITIS B SURFACE ANTIGEN: HEP B S AG: NEGATIVE

## 2016-06-25 LAB — HEPATITIS B SURFACE ANTIBODY,QUALITATIVE: HEP B SURFACE AB, QUAL: NONREACTIVE

## 2016-06-25 LAB — TSH: TSH: 4.16 u[IU]/mL (ref 0.450–4.500)

## 2016-06-25 LAB — HEPATITIS B CORE ANTIBODY, TOTAL: HEP B C TOTAL AB: NEGATIVE

## 2016-06-25 NOTE — Telephone Encounter (Signed)
Please call patient, laboratory evaluation showed anemia with hemoglobin of 9.7 which is at her baseline level, WBC was mildly elevated 12 point 1, please check to see if she has any signs of infection such as decubitus ulcer, UTI, upper respiratory infection,  Rest of the laboratory evaluation showed no significant abnormality

## 2016-06-29 ENCOUNTER — Other Ambulatory Visit: Payer: Medicare Other

## 2016-06-29 DIAGNOSIS — L89894 Pressure ulcer of other site, stage 4: Secondary | ICD-10-CM | POA: Diagnosis not present

## 2016-06-29 DIAGNOSIS — L89154 Pressure ulcer of sacral region, stage 4: Secondary | ICD-10-CM | POA: Diagnosis not present

## 2016-06-29 DIAGNOSIS — L89610 Pressure ulcer of right heel, unstageable: Secondary | ICD-10-CM | POA: Diagnosis not present

## 2016-06-29 LAB — QUANTIFERON TB GOLD ASSAY (BLOOD)

## 2016-06-29 LAB — QUANTIFERON IN TUBE
QFT TB AG MINUS NIL VALUE: 0 IU/mL
QUANTIFERON MITOGEN VALUE: 4.91 [IU]/mL
QUANTIFERON TB AG VALUE: 0.07 IU/mL
QUANTIFERON TB GOLD: NEGATIVE
Quantiferon Nil Value: 0.07 IU/mL

## 2016-06-29 NOTE — Telephone Encounter (Signed)
I called [facility] [phone]) and spoke to [employer].  She was given lab results verbally along with Dr. Rhea Belton recommedations to check patient for any signs of infection.  The results have also been faxed to [phone].

## 2016-06-30 ENCOUNTER — Non-Acute Institutional Stay (SKILLED_NURSING_FACILITY): Payer: Medicare Other | Admitting: Adult Health

## 2016-06-30 ENCOUNTER — Ambulatory Visit
Admission: RE | Admit: 2016-06-30 | Discharge: 2016-06-30 | Disposition: A | Discharge status: Home / Self Care | Payer: Medicare Other | Source: Ambulatory Visit | Attending: Neurology | Admitting: Neurology

## 2016-06-30 ENCOUNTER — Encounter: Payer: Self-pay | Admitting: *Deleted

## 2016-06-30 ENCOUNTER — Telehealth: Payer: Self-pay | Admitting: Neurology

## 2016-06-30 ENCOUNTER — Encounter: Payer: Self-pay | Admitting: Adult Health

## 2016-06-30 DIAGNOSIS — R000 Tachycardia, unspecified: Secondary | ICD-10-CM | POA: Diagnosis not present

## 2016-06-30 DIAGNOSIS — G8220 Paraplegia, unspecified: Secondary | ICD-10-CM

## 2016-06-30 DIAGNOSIS — G35 Multiple sclerosis: Secondary | ICD-10-CM

## 2016-06-30 DIAGNOSIS — E43 Unspecified severe protein-calorie malnutrition: Secondary | ICD-10-CM | POA: Diagnosis not present

## 2016-06-30 DIAGNOSIS — R7989 Other specified abnormal findings of blood chemistry: Secondary | ICD-10-CM

## 2016-06-30 DIAGNOSIS — R945 Abnormal results of liver function studies: Secondary | ICD-10-CM | POA: Diagnosis not present

## 2016-06-30 NOTE — Telephone Encounter (Signed)
Rhonda with [facility] is calling to discuss having to change patient's MRI to another date.

## 2016-06-30 NOTE — Progress Notes (Signed)
Location:    [facility] Room Number: 215A Place of Service:  SNF (31)   CODE STATUS: full code  No Known Allergies  Chief Complaint  Patient presents with  . Acute Visit    fever 101    HPI:  She did have a fever yesterday of 101 and did resolve with tylenol and has not returned. She is having worsening skin break down. She tells me that she is eating well. She does remain on her back on the time. She is continuing to lose weight. As her nutritional status declines with her weight loss she will continue to have worsening skin breakdown. She is awaiting to have a peg tube placed for 6 months to help her with her nutritional improvement.   Past Medical History:  Diagnosis Date  . Buttock wound 03/03/2016  . MS (multiple sclerosis) ([place])   . Pneumonia 02/2016  . Protein calorie malnutrition ([place])   . Severe sepsis ([place]) 03/03/2016  . UTI (urinary tract infection) 02/2016    Past Surgical History:  Procedure Laterality Date  . NO PAST SURGERIES      Social History   Social History  . Marital status: Single    Spouse name: N/A  . Number of children: N/A  . Years of education: N/A   Occupational History  . Disabled    Social History Main Topics  . Smoking status: Former Smoker    Packs/day: 1.00    Types: Cigarettes    Quit date: 02/01/2010  . Smokeless tobacco: Never Used  . Alcohol use No  . Drug use: No  . Sexual activity: Not on file   Other Topics Concern  . Not on file   Social History Narrative   She is a resident at [facility].   Right-handed.   Family History  Problem Relation Age of Onset  . Mental illness Sister       VITAL SIGNS BP (!) 108/58   Pulse (!) 113   Temp (!) 96.7 F (35.9 C)   Resp 16   Ht '4\' 11"'  (1.499 m)   Wt 87 lb 3.2 oz (39.6 kg)   SpO2 98%   BMI 17.61 kg/m   Patient's Medications  New Prescriptions   No medications on file  Previous Medications   ACETAMINOPHEN (TYLENOL) 325 MG TABLET     Give 650 mg  by mouth in the morning for chronic pain and 650 mg by mouth every 6 hours as needed for mild pain   AMINO ACIDS-PROTEIN HYDROLYS (FEEDING SUPPLEMENT, PRO-STAT SUGAR FREE 64,) LIQD    Take 30 mLs by mouth 3 (three) times daily with meals.   BISACODYL (DULCOLAX) 5 MG EC TABLET    Take 1 tablet (5 mg total) by mouth daily as needed for moderate constipation.   CYANOCOBALAMIN 1000 MCG TABLET    Take 1,000 mcg by mouth daily.   FERROUS SULFATE 325 (65 FE) MG TABLET    Take 1 tablet (325 mg total) by mouth 2 (two) times daily with a meal.   MIRTAZAPINE (REMERON) 15 MG TABLET    Take 15 mg by mouth. Take one half tablet at bedtime   MULTIPLE VITAMINS-MINERALS (DECUBI-VITE) CAPS    Take 1 capsule by mouth daily.   NUTRITIONAL SUPPLEMENTS (NUTRITIONAL SUPPLEMENT PO)    Take by mouth. House Supplement - Med Pass - Give 120cc by mouth three times daily   POLYETHYLENE GLYCOL (MIRALAX / GLYCOLAX) PACKET    Take 17 g  by mouth daily.   VITAMIN C (ASCORBIC ACID) 500 MG TABLET    Take 500 mg by mouth 2 (two) times daily.  Modified Medications   No medications on file  Discontinued Medications   No medications on file     SIGNIFICANT DIAGNOSTIC EXAMS   03-04-16: swallow study: recommend diet of dysphagia 3 solids, honey thick liquids (no straw), meds whole in puree, and cough/clear throat after every few bites/sips  03-07-16: chest x-ray: Increase left lung opacity concerning for worsening pneumonia or atelectasis with associated pleural effusion.   03-07-16: mri of pelvis: 1. Cellulitis and diffuse myofasciitis involving the pelvic and hip musculature. There also or rim enhancing fluid collections in the gluteus maximus muscles bilaterally concerning for pyomyositis. 2. No findings to suggest septic arthritis or osteomyelitis. 3. Shallow sacral decubitus ulcer but no underlying sacral osteomyelitis. 4. Markedly distended bladder.  03-07-16: mri of lumbar: 1. Negative for lumbar spine infection or  impingement. 2. There is non organized edema in the subcutaneous fat. Negative for abscess. 3. Urinary retention causing bilateral hydroureteronephrosis, also seen by CT in 2010.   03-12-16: bone marrow biopsy: Successful CT guided left iliac bone marrow aspiration and core biopsy. Note, an additional sample was set aside for Gram stain and culture analysis.  04-05-16: transvaginal and pelvic ultrasound: 1. Nonvisualization of the left ovary   2. Otherwise negative pelvic ultrasound  04-05-16: renal ultrasound: Mild right hydronephrosis.  04-05-16: chest x-ray: No evidence of pneumonia.  04-07-16: ct angio of chest: 1. No acute pulmonary embolus. 2. Tiny tree-in-bud densities in the right upper and lower lobes consistent with bronchiolitis. 3. 5 mm or less pulmonary nodular densities are also seen within the right upper and lower lobes possibly representing subpleural and perifissural lymph nodes. No follow-up needed if patient is low-risk (and has no known or suspected primary neoplasm). Non-contrast chest CT can be considered in 12 months if patient is high-risk.    06-03-16: TEE: - The patient was in sinus tachycardia. Normal LV size with EF 45-50%, diffuse hypokinesis. Normal RV size with mildly decreased systolic function. No significant valvular abnormalities.    LABS REVIEWED:   03-02-16: wbc 13.2; hgb 11.7; hct 37.8; mcv 92.0; plt 526; glucose 163; bun 12; creat 0.77; k+ 3.7; na++ 141; ast 113; alt 55; alk phos 118; total bili 1.7; albumin 2.2  03-05-16: wbc 2.9; hgb 8.8; hct 29.4; mcv 93.3; plt 360; glucose 110; bun 13; creat 0.87; k+ 3.5 ;na++ 158; mag 2.3 03-07-16: wbc 1.8; hgb 9.3; hct 31.0; mcv 92.3; plt 299; glucose 112; bun 9; creat 0.99; k+ 3.2 ;na++ 143; mag 2.0; urine culture: e-coli; blood cultures: neg; HIV nr 03-12-16: wbc 2.6; hgb 8.7; hct 29.0; mcv 89.8; plt 392; glucose 104; bun <5; creat 0.47; k+ 3.6; na++ 141 03-15-16: wbc 24.2; hgb 8.7; hct 28.9; mcv 90.6; plt 496; glucose 88; bun 6;  creat 0.41; k+ 3.9; na++ 144 mag 1.8  03-19-16: wbc 12.3; hgb 10.7; hct 35.8; mcv 91.7; plt 513; glucose 112; bun 14.2; creat 0.40; k+ 4.7; na++149; alt 64; ast 73; albumin 3.2  04-05-16: wbc 10.9; hgb 10.3; hct 33.0; mcv 89.7; plt 632; glucose 92; bun 10; creat 0.58; k+ 3.7; na++ 141; mag 1.7; urine culture: citrobacter freundii:cipro  04-06-16: wbc 9.0; hgb 8.2; hct 26.7; mcv 91.1; plt 532; glucose 89; bun 9; creat 0.41; k+ 3.2; na++ 143; liver normal albumin 2.1 04-07-16: wbc 7.9 ;hgb 7.0; hct 22.8; mcv 90.5; plt 517;glucose 82; bun 6; creat 0.38;  k+ 4.2; na++ 142; iron 14; tibc 174; ferritin 276; folate 10.1; vit B 12: 423; tsh 1.741; d-dimer 3.67  04-08-16: wbc 6.3; hgb 8.4; hct 27.1; mcv 84.4 ;plt 526; glucose 87; bun 6; creat 0.34; k+ 3.8; na++ 139  05-12-16: wbc 9.8; hgb 10.3; hct 32.6; mcv 90.7; plt 497; glucose 92; bun 11.2; creat 0.54; k+ 4.5; na++ 140; liver normal albumin 4.0;  06-23-16: wbc 12.1; hgb 9.4; hct 31.2; mcv 87.3; plt 629; glucose 150; bun 11.6; creat 0.22; k+ 3.9; na++ 139 alt 63; ast 44; alk phos 219; albumin 3.0; vit B 12: 166; folic acid 06.0; pre-albumin 9; tsh 2.28      Review of Systems  Constitutional: Negative for malaise/fatigue.  Respiratory: Negative for cough and shortness of breath.   Cardiovascular: Negative for chest pain, palpitations and leg swelling.  Gastrointestinal: Negative for abdominal pain, constipation and heartburn.  Genitourinary:       Has foley   Musculoskeletal: has generalized muscle aches; tylenol in am relieves .  Skin: Negative.        Has skin wounds   Neurological: Negative for dizziness.  Psychiatric/Behavioral: The patient is not nervous/anxious.      Physical Exam  Constitutional: She is oriented to person, place, and time. No distress.  Frail; malnurished   Eyes: Conjunctivae are normal.  Neck: Neck supple. No JVD present. No thyromegaly present.  Cardiovascular:regular rhythm and intact distal pulses.  is tachycardic    Respiratory: Effort normal and breath sounds normal. No respiratory distress. She has no wheezes.  GI: Soft. Bowel sounds are normal. She exhibits no distension. There is no tenderness.  Genitourinary:  Genitourinary Comments: Has foley   Musculoskeletal: She exhibits no edema.  Able to move upper extremities Bilateral lower extremities with splints in place    Lymphadenopathy:    She has no cervical adenopathy.  Neurological: She is alert and oriented to person, place, and time.  Skin: Skin is warm and dry. She is not diaphoretic.  Left ischium: stage III 2.0 x 1.7 cm 25% granulation coccyx: stage IV: 5.4 x 4.1 x 0.7 cm undermining 2.4 cm at 9:00 Left posterior thigh: un-staged: 2.6 x 1.5 cm  Psychiatric: She has a normal mood and affect.    ASSESSMENT/ PLAN:  1. Weight loss 2. Malnutrition 3. Tachycardia  Will continue supplements as indicated; she is awaiting a peg tube placement; she is awaiting her cardiology appointment and her live ultrasound    Time spent with patient 45  minutes >50% time spent counseling; reviewing medical record; tests; labs; and developing future plan of care   MD is aware of resident's narcotic use and is in agreement with current plan of care. We will attempt to wean resident as apropriate   Ok Edwards NP [facility]  Contact [phone] Monday through Friday 8am- 5pm  After hours call [phone]

## 2016-06-30 NOTE — Telephone Encounter (Signed)
I called Deanna Baldwin at GI and she informed me that they had to reschedule Deanna Baldwin's MRI due to her legs couldn't fit into the machine correctly. So they scheduled her for 07/15/16 in a bigger machine.

## 2016-07-01 ENCOUNTER — Telehealth (HOSPITAL_COMMUNITY): Payer: Self-pay

## 2016-07-01 ENCOUNTER — Other Ambulatory Visit: Payer: Self-pay | Admitting: Internal Medicine

## 2016-07-01 DIAGNOSIS — R1310 Dysphagia, unspecified: Secondary | ICD-10-CM

## 2016-07-01 NOTE — Telephone Encounter (Signed)
Called pt's nurse to inform her that I had Dr. Barbie Banner to review pt's peg tube order. Pt needs a ct abdomen with contrast first before we can schedule procedure. No answer. Left message for her to return call with any questions. AW

## 2016-07-05 ENCOUNTER — Telehealth: Payer: Self-pay | Admitting: *Deleted

## 2016-07-05 NOTE — Telephone Encounter (Signed)
Collected 06/24/16 - JCV 3.48 H

## 2016-07-06 DIAGNOSIS — L89610 Pressure ulcer of right heel, unstageable: Secondary | ICD-10-CM | POA: Diagnosis not present

## 2016-07-06 DIAGNOSIS — L89154 Pressure ulcer of sacral region, stage 4: Secondary | ICD-10-CM | POA: Diagnosis not present

## 2016-07-06 DIAGNOSIS — R634 Abnormal weight loss: Secondary | ICD-10-CM | POA: Insufficient documentation

## 2016-07-06 DIAGNOSIS — L89894 Pressure ulcer of other site, stage 4: Secondary | ICD-10-CM | POA: Diagnosis not present

## 2016-07-07 ENCOUNTER — Inpatient Hospital Stay (HOSPITAL_COMMUNITY)
Admission: EM | Admit: 2016-07-07 | Discharge: 2016-07-10 | DRG: 871 | Disposition: A | Discharge status: Skilled Nursing Facility | Payer: Medicare Other | Attending: Family Medicine | Admitting: Family Medicine

## 2016-07-07 ENCOUNTER — Encounter (HOSPITAL_COMMUNITY): Payer: Self-pay

## 2016-07-07 ENCOUNTER — Emergency Department (HOSPITAL_COMMUNITY): Payer: Medicare Other

## 2016-07-07 DIAGNOSIS — M791 Myalgia: Secondary | ICD-10-CM | POA: Diagnosis not present

## 2016-07-07 DIAGNOSIS — E872 Acidosis: Secondary | ICD-10-CM | POA: Diagnosis not present

## 2016-07-07 DIAGNOSIS — B964 Proteus (mirabilis) (morganii) as the cause of diseases classified elsewhere: Secondary | ICD-10-CM | POA: Diagnosis present

## 2016-07-07 DIAGNOSIS — N390 Urinary tract infection, site not specified: Secondary | ICD-10-CM

## 2016-07-07 DIAGNOSIS — D709 Neutropenia, unspecified: Secondary | ICD-10-CM | POA: Diagnosis not present

## 2016-07-07 DIAGNOSIS — R6520 Severe sepsis without septic shock: Secondary | ICD-10-CM | POA: Diagnosis not present

## 2016-07-07 DIAGNOSIS — N10 Acute tubulo-interstitial nephritis: Secondary | ICD-10-CM | POA: Diagnosis not present

## 2016-07-07 DIAGNOSIS — S31809A Unspecified open wound of unspecified buttock, initial encounter: Secondary | ICD-10-CM | POA: Diagnosis not present

## 2016-07-07 DIAGNOSIS — K5900 Constipation, unspecified: Secondary | ICD-10-CM | POA: Diagnosis present

## 2016-07-07 DIAGNOSIS — E860 Dehydration: Secondary | ICD-10-CM | POA: Diagnosis present

## 2016-07-07 DIAGNOSIS — L8945 Pressure ulcer of contiguous site of back, buttock and hip, unstageable: Secondary | ICD-10-CM | POA: Diagnosis present

## 2016-07-07 DIAGNOSIS — N319 Neuromuscular dysfunction of bladder, unspecified: Secondary | ICD-10-CM | POA: Diagnosis not present

## 2016-07-07 DIAGNOSIS — L89610 Pressure ulcer of right heel, unstageable: Secondary | ICD-10-CM | POA: Diagnosis present

## 2016-07-07 DIAGNOSIS — Z1639 Resistance to other specified antimicrobial drug: Secondary | ICD-10-CM | POA: Diagnosis present

## 2016-07-07 DIAGNOSIS — T839XXA Unspecified complication of genitourinary prosthetic device, implant and graft, initial encounter: Secondary | ICD-10-CM

## 2016-07-07 DIAGNOSIS — Z888 Allergy status to other drugs, medicaments and biological substances status: Secondary | ICD-10-CM | POA: Diagnosis not present

## 2016-07-07 DIAGNOSIS — Z993 Dependence on wheelchair: Secondary | ICD-10-CM

## 2016-07-07 DIAGNOSIS — E43 Unspecified severe protein-calorie malnutrition: Secondary | ICD-10-CM | POA: Diagnosis not present

## 2016-07-07 DIAGNOSIS — Z79899 Other long term (current) drug therapy: Secondary | ICD-10-CM | POA: Diagnosis not present

## 2016-07-07 DIAGNOSIS — L89220 Pressure ulcer of left hip, unstageable: Secondary | ICD-10-CM | POA: Diagnosis present

## 2016-07-07 DIAGNOSIS — G8250 Quadriplegia, unspecified: Secondary | ICD-10-CM | POA: Diagnosis present

## 2016-07-07 DIAGNOSIS — T83098A Other mechanical complication of other indwelling urethral catheter, initial encounter: Secondary | ICD-10-CM | POA: Diagnosis not present

## 2016-07-07 DIAGNOSIS — L8994 Pressure ulcer of unspecified site, stage 4: Secondary | ICD-10-CM | POA: Diagnosis present

## 2016-07-07 DIAGNOSIS — D509 Iron deficiency anemia, unspecified: Secondary | ICD-10-CM | POA: Diagnosis not present

## 2016-07-07 DIAGNOSIS — R000 Tachycardia, unspecified: Secondary | ICD-10-CM | POA: Diagnosis not present

## 2016-07-07 DIAGNOSIS — L8992 Pressure ulcer of unspecified site, stage 2: Secondary | ICD-10-CM | POA: Diagnosis not present

## 2016-07-07 DIAGNOSIS — N368 Other specified disorders of urethra: Secondary | ICD-10-CM | POA: Diagnosis present

## 2016-07-07 DIAGNOSIS — E876 Hypokalemia: Secondary | ICD-10-CM | POA: Diagnosis not present

## 2016-07-07 DIAGNOSIS — L89894 Pressure ulcer of other site, stage 4: Secondary | ICD-10-CM | POA: Diagnosis present

## 2016-07-07 DIAGNOSIS — Z87891 Personal history of nicotine dependence: Secondary | ICD-10-CM

## 2016-07-07 DIAGNOSIS — T839XXD Unspecified complication of genitourinary prosthetic device, implant and graft, subsequent encounter: Secondary | ICD-10-CM | POA: Diagnosis not present

## 2016-07-07 DIAGNOSIS — D638 Anemia in other chronic diseases classified elsewhere: Secondary | ICD-10-CM | POA: Diagnosis present

## 2016-07-07 DIAGNOSIS — L89154 Pressure ulcer of sacral region, stage 4: Secondary | ICD-10-CM | POA: Diagnosis present

## 2016-07-07 DIAGNOSIS — J690 Pneumonitis due to inhalation of food and vomit: Secondary | ICD-10-CM | POA: Diagnosis not present

## 2016-07-07 DIAGNOSIS — A419 Sepsis, unspecified organism: Secondary | ICD-10-CM | POA: Diagnosis not present

## 2016-07-07 DIAGNOSIS — G35 Multiple sclerosis: Secondary | ICD-10-CM | POA: Diagnosis present

## 2016-07-07 DIAGNOSIS — R1310 Dysphagia, unspecified: Secondary | ICD-10-CM | POA: Diagnosis present

## 2016-07-07 DIAGNOSIS — E870 Hyperosmolality and hypernatremia: Secondary | ICD-10-CM | POA: Diagnosis not present

## 2016-07-07 DIAGNOSIS — K6812 Psoas muscle abscess: Secondary | ICD-10-CM | POA: Diagnosis not present

## 2016-07-07 DIAGNOSIS — R1312 Dysphagia, oropharyngeal phase: Secondary | ICD-10-CM | POA: Diagnosis not present

## 2016-07-07 DIAGNOSIS — L89009 Pressure ulcer of unspecified elbow, unspecified stage: Secondary | ICD-10-CM | POA: Diagnosis not present

## 2016-07-07 DIAGNOSIS — R509 Fever, unspecified: Secondary | ICD-10-CM | POA: Diagnosis not present

## 2016-07-07 DIAGNOSIS — R945 Abnormal results of liver function studies: Secondary | ICD-10-CM | POA: Diagnosis not present

## 2016-07-07 DIAGNOSIS — G8220 Paraplegia, unspecified: Secondary | ICD-10-CM | POA: Diagnosis not present

## 2016-07-07 DIAGNOSIS — G114 Hereditary spastic paraplegia: Secondary | ICD-10-CM | POA: Diagnosis not present

## 2016-07-07 HISTORY — DX: Sepsis, unspecified organism: A41.9

## 2016-07-07 LAB — CBC WITH DIFFERENTIAL/PLATELET
Basophils Absolute: 0 10*3/uL (ref 0.0–0.1)
Basophils Relative: 0 %
EOS ABS: 0 10*3/uL (ref 0.0–0.7)
Eosinophils Relative: 0 %
HCT: 34.9 % — ABNORMAL LOW (ref 36.0–46.0)
HEMOGLOBIN: 10.4 g/dL — AB (ref 12.0–15.0)
Lymphocytes Relative: 8 %
Lymphs Abs: 0.8 10*3/uL (ref 0.7–4.0)
MCH: 26.3 pg (ref 26.0–34.0)
MCHC: 29.8 g/dL — ABNORMAL LOW (ref 30.0–36.0)
MCV: 88.4 fL (ref 78.0–100.0)
Monocytes Absolute: 0.4 10*3/uL (ref 0.1–1.0)
Monocytes Relative: 3 %
NEUTROS PCT: 89 %
Neutro Abs: 9.5 10*3/uL — ABNORMAL HIGH (ref 1.7–7.7)
Platelets: 661 10*3/uL — ABNORMAL HIGH (ref 150–400)
RBC: 3.95 MIL/uL (ref 3.87–5.11)
RDW: 14.9 % (ref 11.5–15.5)
WBC: 10.8 10*3/uL — AB (ref 4.0–10.5)

## 2016-07-07 LAB — URINALYSIS, ROUTINE W REFLEX MICROSCOPIC
Bilirubin Urine: NEGATIVE
Glucose, UA: NEGATIVE mg/dL
KETONES UR: NEGATIVE mg/dL
NITRITE: POSITIVE — AB
PROTEIN: 100 mg/dL — AB
Specific Gravity, Urine: 1.02 (ref 1.005–1.030)
pH: 8.5 — ABNORMAL HIGH (ref 5.0–8.0)

## 2016-07-07 LAB — COMPREHENSIVE METABOLIC PANEL
ALT: 66 U/L — AB (ref 14–54)
AST: 62 U/L — ABNORMAL HIGH (ref 15–41)
Albumin: 2.4 g/dL — ABNORMAL LOW (ref 3.5–5.0)
Alkaline Phosphatase: 203 U/L — ABNORMAL HIGH (ref 38–126)
Anion gap: 11 (ref 5–15)
BUN: 7 mg/dL (ref 6–20)
CO2: 27 mmol/L (ref 22–32)
CREATININE: 0.46 mg/dL (ref 0.44–1.00)
Calcium: 8.8 mg/dL — ABNORMAL LOW (ref 8.9–10.3)
Chloride: 101 mmol/L (ref 101–111)
Glucose, Bld: 154 mg/dL — ABNORMAL HIGH (ref 65–99)
POTASSIUM: 3.5 mmol/L (ref 3.5–5.1)
Sodium: 139 mmol/L (ref 135–145)
Total Bilirubin: 0.4 mg/dL (ref 0.3–1.2)
Total Protein: 6.5 g/dL (ref 6.5–8.1)

## 2016-07-07 LAB — URINALYSIS, MICROSCOPIC (REFLEX)

## 2016-07-07 LAB — I-STAT CG4 LACTIC ACID, ED: LACTIC ACID, VENOUS: 2.47 mmol/L — AB (ref 0.5–1.9)

## 2016-07-07 MED ORDER — POLYETHYLENE GLYCOL 3350 17 G PO PACK
17.0000 g | PACK | Freq: Every day | ORAL | Status: DC
  Administered 2016-07-08 – 2016-07-10 (×2): 17 g via ORAL
  Filled 2016-07-07 (×3): qty 1

## 2016-07-07 MED ORDER — PRO-STAT SUGAR FREE PO LIQD
30.0000 mL | Freq: Three times a day (TID) | ORAL | Status: DC
  Administered 2016-07-08 – 2016-07-10 (×5): 30 mL via ORAL
  Filled 2016-07-07 (×5): qty 30

## 2016-07-07 MED ORDER — ACETAMINOPHEN 325 MG PO TABS
650.0000 mg | ORAL_TABLET | Freq: Four times a day (QID) | ORAL | Status: DC | PRN
  Administered 2016-07-08 – 2016-07-09 (×2): 650 mg via ORAL
  Filled 2016-07-07: qty 2

## 2016-07-07 MED ORDER — ACETAMINOPHEN 325 MG PO TABS
650.0000 mg | ORAL_TABLET | Freq: Every day | ORAL | Status: DC
  Administered 2016-07-08 – 2016-07-10 (×3): 650 mg via ORAL
  Filled 2016-07-07 (×3): qty 2

## 2016-07-07 MED ORDER — ACETAMINOPHEN 500 MG PO TABS
1000.0000 mg | ORAL_TABLET | Freq: Once | ORAL | Status: AC
  Administered 2016-07-07: 1000 mg via ORAL
  Filled 2016-07-07: qty 2

## 2016-07-07 MED ORDER — PIPERACILLIN-TAZOBACTAM 3.375 G IVPB 30 MIN
3.3750 g | Freq: Once | INTRAVENOUS | Status: AC
  Administered 2016-07-07: 3.375 g via INTRAVENOUS
  Filled 2016-07-07: qty 50

## 2016-07-07 MED ORDER — SODIUM CHLORIDE 0.9 % IV BOLUS (SEPSIS)
250.0000 mL | Freq: Once | INTRAVENOUS | Status: AC
  Administered 2016-07-07: 250 mL via INTRAVENOUS

## 2016-07-07 MED ORDER — SODIUM CHLORIDE 0.9 % IV BOLUS (SEPSIS)
1000.0000 mL | Freq: Once | INTRAVENOUS | Status: AC
Start: 2016-07-07 — End: 2016-07-07
  Administered 2016-07-07: 1000 mL via INTRAVENOUS

## 2016-07-07 MED ORDER — BISACODYL 5 MG PO TBEC: 5.0000 mg | DELAYED_RELEASE_TABLET | Freq: Every day | ORAL | Status: DC | PRN

## 2016-07-07 MED ORDER — VANCOMYCIN HCL IN DEXTROSE 1-5 GM/200ML-% IV SOLN
1000.0000 mg | INTRAVENOUS | Status: DC
  Administered 2016-07-08: 1000 mg via INTRAVENOUS
  Filled 2016-07-07 (×3): qty 200

## 2016-07-07 MED ORDER — SODIUM CHLORIDE 0.9% FLUSH
3.0000 mL | Freq: Two times a day (BID) | INTRAVENOUS | Status: DC
  Administered 2016-07-07 – 2016-07-09 (×4): 3 mL via INTRAVENOUS

## 2016-07-07 MED ORDER — ENOXAPARIN SODIUM 30 MG/0.3ML ~~LOC~~ SOLN
30.0000 mg | SUBCUTANEOUS | Status: DC
  Administered 2016-07-08 – 2016-07-10 (×3): 30 mg via SUBCUTANEOUS
  Filled 2016-07-07 (×3): qty 0.3

## 2016-07-07 MED ORDER — SODIUM CHLORIDE 0.9 % IV BOLUS (SEPSIS)
1000.0000 mL | Freq: Once | INTRAVENOUS | Status: AC
  Administered 2016-07-07: 1000 mL via INTRAVENOUS

## 2016-07-07 MED ORDER — FERROUS SULFATE 325 (65 FE) MG PO TABS: 325.0000 mg | ORAL_TABLET | Freq: Two times a day (BID) | ORAL | Status: DC

## 2016-07-07 MED ORDER — VANCOMYCIN HCL IN DEXTROSE 1-5 GM/200ML-% IV SOLN
1000.0000 mg | Freq: Once | INTRAVENOUS | Status: AC
  Administered 2016-07-07: 1000 mg via INTRAVENOUS
  Filled 2016-07-07: qty 200

## 2016-07-07 MED ORDER — MIRTAZAPINE 15 MG PO TABS
15.0000 mg | ORAL_TABLET | Freq: Every day | ORAL | Status: DC
  Administered 2016-07-08 – 2016-07-09 (×2): 15 mg via ORAL
  Filled 2016-07-07 (×2): qty 1

## 2016-07-07 MED ORDER — PIPERACILLIN-TAZOBACTAM 3.375 G IVPB
3.3750 g | Freq: Three times a day (TID) | INTRAVENOUS | Status: DC
  Administered 2016-07-08 – 2016-07-10 (×7): 3.375 g via INTRAVENOUS
  Filled 2016-07-07 (×9): qty 50

## 2016-07-07 NOTE — ED Triage Notes (Signed)
Patient here requesting for foley to be placed. States that her previous foley came out last night. Unsure if pulled out by accident or removed. Patient wheelchair bound and reports that her HR is always high

## 2016-07-07 NOTE — ED Provider Notes (Signed)
[facility] DEPT Provider Note   CSN: [id number] Arrival date & time: 07/07/16  1034     History   Chief Complaint No chief complaint on file.   HPI Deanna Baldwin is a 41 y.o. female.  41yo F w/ extensive PMH including spastic quadriplegia, MS, chronic foley, sacral ulcers who p/w foley problem. Last night patient's foley was accidentally pulled out. Multiple nurses at her facility attempted replacement without success. She went to a neurology appt this morning and was then brought here for foley replacement. She denies any significant abdominal discomfort and she denies any other complaints. No cough/cold symptoms, fevers, vomiting, or change in urine output recently.   The history is provided by the patient.    Past Medical History:  Diagnosis Date  . Buttock wound 03/03/2016  . MS (multiple sclerosis) ([place])   . Pneumonia 02/2016  . Protein calorie malnutrition ([place])   . Severe sepsis ([place]) 03/03/2016  . UTI (urinary tract infection) 02/2016    Patient Active Problem List   Diagnosis Date Noted  . Unintentional weight loss 07/06/2016  . Relapsing remitting multiple sclerosis ([place]) 06/08/2016  . 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 Neurogenic bladder 04/09/2016  . Foley catheter in place 04/09/2016  . Sinus tachycardia 04/08/2016  . Anemia of chronic disease 04/06/2016  . Dysphagia, oral phase 03/17/2016  . Neutropenia ([place])   . Muscle abscess   . Myofascitis   . Aspiration pneumonia of left lower lobe due to vomit ([place])   . Sacral decubitus ulcer, stage III ([place])   . Multiple sclerosis ([place])   . Protein-calorie malnutrition, severe ([place])   . Hypokalemia 03/05/2016  . Hypernatremia   . Pressure injury of skin 03/03/2016  . 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 Community acquired pneumonia 03/02/2016  . UTI (urinary tract infection) 03/02/2016  . Abnormal LFTs 03/02/2016    Past Surgical History:  Procedure Laterality  Date  . NO PAST SURGERIES      OB History    No data available       Home Medications    Prior to Admission medications   Medication Sig Start Date End Date Taking? Authorizing Provider  acetaminophen (TYLENOL) 325 MG tablet Take 650 mg by mouth every morning.    Yes [provider]  acetaminophen (TYLENOL) 325 MG tablet Take 650 mg by mouth every 6 (six) hours as needed for mild pain.   Yes [provider]  Amino Acids-Protein Hydrolys (FEEDING SUPPLEMENT, PRO-STAT SUGAR FREE 64,) LIQD Take 30 mLs by mouth 3 (three) times daily with meals.   Yes [provider]  bisacodyl (DULCOLAX) 5 MG EC tablet Take 1 tablet (5 mg total) by mouth daily as needed for moderate constipation. 03/15/16  Yes Florencia Reasons, MD  cyanocobalamin 1000 MCG tablet Take 1,000 mcg by mouth daily.   Yes [provider]  ferrous sulfate 325 (65 FE) MG tablet Take 1 tablet (325 mg total) by mouth 2 (two) times daily with a meal. 04/08/16  Yes Short, Noah Delaine, MD  mirtazapine (REMERON) 15 MG tablet Take 15 mg by mouth at bedtime. Take one half tablet   Yes [provider]  Multiple Vitamins-Minerals (DECUBI-VITE) CAPS Take 1 capsule by mouth daily.   Yes [provider]  Nutritional Supplements (NUTRITIONAL SUPPLEMENT PO) Take 120 mLs by mouth 3 (three) times daily as needed. [place]   Yes [provider]  polyethylene glycol (MIRALAX / GLYCOLAX) packet Take 17  g by mouth daily.   Yes [provider]  vitamin C (ASCORBIC ACID) 500 MG tablet Take 500 mg by mouth 2 (two) times daily.   Yes [provider]    Family History Family History  Problem Relation Age of Onset  . Mental illness Sister     Social History Social History  Substance Use Topics  . Smoking status: Former Smoker    Packs/day: 1.00    Types: Cigarettes    Quit date: 02/01/2010  . Smokeless tobacco: Never Used  . Alcohol use No     Allergies     Patient has no known allergies.   Review of Systems Review of Systems   Physical Exam Updated Vital Signs BP (!) 94/59   Pulse (!) 140   Temp 99.1 F (37.3 C) (Oral)   Resp 20   SpO2 100%   Physical Exam  Constitutional: She is oriented to person, place, and time. No distress.  Chronically ill-appearing, cachectic, awake and alert  HENT:  Head: Normocephalic and atraumatic.  Dry mucous membranes  Eyes: Conjunctivae are normal.  Neck: Neck supple.  Cardiovascular: Regular rhythm and normal heart sounds.  Tachycardia present.   No murmur heard. Pulmonary/Chest: Effort normal and breath sounds normal.  Abdominal: Soft. Bowel sounds are normal. She exhibits no distension. There is no tenderness.  Musculoskeletal: She exhibits no edema.  Global muscle atrophy  Neurological: She is alert and oriented to person, place, and time.  Global increased muscle tone with stiff legs  Skin: Skin is warm and dry.  Multiple pressure ulcers including L buttock, L thigh, L leg, R leg  Psychiatric:  Calm, cooperative  Nursing note and vitals reviewed.    ED Treatments / Results  Labs (all labs ordered are listed, but only abnormal results are displayed) Labs Reviewed  I-STAT CG4 LACTIC ACID, ED - Abnormal; Notable for the following:       Result Value   Lactic Acid, Venous 2.47 (*)    All other components within normal limits  CULTURE, BLOOD (ROUTINE X 2)  CULTURE, BLOOD (ROUTINE X 2)  URINE CULTURE  COMPREHENSIVE METABOLIC PANEL  CBC WITH DIFFERENTIAL/PLATELET  URINALYSIS, ROUTINE W REFLEX MICROSCOPIC    EKG  EKG Interpretation None       Radiology Dg Chest Port 1 View  Result Date: 07/07/2016 CLINICAL DATA:  41 year old with current history of multiple sclerosis, presenting with acute onset of fever. Personal history of pneumonia earlier this year. EXAM: PORTABLE CHEST 1 VIEW COMPARISON:  CT chest 04/07/2016. Chest x-rays 04/06/2016, 03/07/2016 and earlier. FINDINGS:  Patient is slightly rotated to the left. Cardiomediastinal silhouette unremarkable, unchanged. Lungs clear. Bronchovascular markings normal. Pulmonary vascularity normal. No visible pleural effusions. No pneumothorax. IMPRESSION: No acute cardiopulmonary disease. Electronically Signed   By: Evangeline Dakin M.D.   On: 07/07/2016 15:51    Procedures BLADDER CATHETERIZATION Date/Time: 07/07/2016 4:46 PM Performed by: Sharlett Iles Authorized by: Sharlett Iles   Consent:    Consent obtained:  Verbal   Consent given by:  Patient Pre-procedure details:    Procedure purpose:  Therapeutic   Preparation: Patient was prepped and draped in usual sterile fashion   Anesthesia (see MAR for exact dosages):    Anesthesia method:  None Procedure details:    Provider performed due to:  Altered anatomy, complicated insertion and nurse unable to complete   Altered anatomy details: urethral erosion.   Catheter insertion:  Indwelling   Catheter type:  Foley  Catheter size:  18 Fr   Bladder irrigation: no     Number of attempts:  2   Urine characteristics:  Yellow Post-procedure details:    Patient tolerance of procedure:  Tolerated well, no immediate complications .Critical Care Performed by: Sharlett Iles Authorized by: Sharlett Iles   Critical care provider statement:    Critical care time (minutes):  35   Critical care time was exclusive of:  Separately billable procedures and treating other patients   Critical care was necessary to treat or prevent imminent or life-threatening deterioration of the following conditions:  Sepsis   Critical care was time spent personally by me on the following activities:  Development of treatment plan with patient or surrogate, discussions with consultants, examination of patient, obtaining history from patient or surrogate, ordering and performing treatments and interventions, ordering and review of laboratory studies, ordering and  review of radiographic studies, re-evaluation of patient's condition and review of old charts   (including critical care time)  Medications Ordered in ED Medications  sodium chloride 0.9 % bolus 1,000 mL (not administered)    And  sodium chloride 0.9 % bolus 250 mL (not administered)  piperacillin-tazobactam (ZOSYN) IVPB 3.375 g (not administered)  vancomycin (VANCOCIN) IVPB 1000 mg/200 mL premix (not administered)  acetaminophen (TYLENOL) tablet 1,000 mg (1,000 mg Oral Given 07/07/16 1603)     Initial Impression / Assessment and Plan / ED Course  I have reviewed the triage vital signs and the nursing notes.  Pertinent labs & imaging results that were available during my care of the patient were reviewed by me and considered in my medical decision making (see chart for details).     Pt presents for replacement of foley catheter. She was tachycardic on exam but states that this is usual for her. Afebrile initially at 99.1. She denied any new complaints. She did have multiple areas of pressure ulcers that appeared bandaged appropriately. Bedside nurse attempted Foley replacement without success. The patient stated that she had difficulty with Foley replacement last time. I contacted urology and discussed with Dr. 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 I attempted at bedside. There is a small amount of urine in the line suggesting that it is in the correct position. Meanwhile while the patient was awaiting Foley placement, she developed a fever to 100.9. Initiated a code sepsis given her immunosuppression and tachycardia. Patient given broad-spectrum antibiotics and IV fluids. I'm signing out to the oncoming provider who will follow up on her lab work. I anticipate admission.  Final Clinical Impressions(s) / ED Diagnoses   Final diagnoses:  None    New Prescriptions New Prescriptions    No medications on file     Jaritza Duignan, Wenda Overland, MD 07/07/16 [phone]

## 2016-07-07 NOTE — H&P (Signed)
[facility] Admission History and Physical Service Pager: [phone]  Patient name: Deanna Baldwin record number: [id number] Date of birth: Apr 19, 1975 Age: 41 y.o. Gender: female  Primary Care Provider: [name], DO Consultants: None Code Status: Full  Chief Complaint: needed foley catheter changed  Assessment and Plan: Deanna Baldwin is a 41 y.o. female presenting with dislodged foley catheter and found to meet sepsis criteria. 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   Sepsis: qSofa 2. Tachycardic in mid-100s and soft BPs 90s/50s (though history of both according to records). Low grade fever at 100 deg F. UA positive for nitrites, leukocytes and small blood. Many bacteria present on microscopy. Negative CXR. Remains alert and mentating well. Has indwelling foley and hx of constipation, which make UTI most likely. Last treated for UTI in March 2018 with culture that grew Citrobacter freundii, which was resistant to cefazolin, gentamicin, and bactrim; completed course of cipro. Hx sacral ulcer. Lactic acid to 2.47, but improving with IVFs.   - Admit to SDU, attending Dr. Gwendlyn Deutscher - s/p 2.5L of fluid in ED - repeat NaCl bolus as needed  - Follow blood, urine cultures - Continue vanc/zosyn until cultures result - Bowel regimen - Tylenol prn for fever  Persistent tachycardia: Present during last hospitalization in March 2018. Thought to be 2/2 frailty and poor functional status. Had CTA chest in March negative for PE but with small pulmonary nodular densities. Also with anemia. TSH WNL 06/23/16 at 2.28. 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 Due for next rituxan infusion tomorrow, 6/7. Receives infusions every 6 months.  - Patient plans to call [facility] to let them know about hospitalization  tomorrow morning - Turn patient q4h - Dysphagia 3 with honey thickened liquids  Anemia: Stable. Hgb 10.4 on admission. New BL ~ 10. Hgb was 14.0 in 2010. Iron low at 14 04/07/16; ferritin, B12, folate WNL.  - Restart home ferrous sulfate 325 mg BID once infection cleared  Malnutrition: Albumin 2.4.  - Continue home pro-stat  - Consulted Nutrition, appreciate recs  Heel wounds and recent sacral decubitus ulcer - Wound consult, appreciate recs  FEN/GI: Dysphagia 3 with honey thickened liquids Prophylaxis: Lovenox  Disposition: Return to SNF ([facility]) once clinically stable and on PO antibiotics   History of Present Illness:  Deanna Baldwin is a 41 y.o. female presenting to ED to have dislodged foley replaced. She reports she did not think she was unwell when she presented to ED but does feel better after some IVFs. She denies n/v/d. Does have constipation. Denies abdominal pain, dysuria or pain with foley. Unable to say if had new fever, as she routinely has fevers at night. Had not noticed abnormal look to urine in foley bag. No chest pain or dyspnea.   Review Of Systems: Per HPI with the following additions:  Review of Systems  Constitutional: Positive for fever. Negative for chills.  HENT: Negative for congestion and sore throat.   Eyes: Negative for pain and redness.  Respiratory: Negative for cough and shortness of breath.   Cardiovascular: Negative for chest pain and palpitations.  Gastrointestinal: Negative for abdominal pain, diarrhea, nausea and vomiting.  Genitourinary: Negative for dysuria and flank pain.  Musculoskeletal: Negative for back pain.  Skin: Negative for rash.  Neurological: Negative for dizziness and headaches.    Patient Active Problem List  Diagnosis Date Noted  . Sepsis ([place]) 07/07/2016  . Unintentional weight loss 07/06/2016  . Relapsing remitting multiple sclerosis ([place]) 06/08/2016  . 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 Neurogenic bladder  04/09/2016  . Foley catheter in place 04/09/2016  . Sinus tachycardia 04/08/2016  . Anemia of chronic disease 04/06/2016  . Dysphagia, oral phase 03/17/2016  . Neutropenia ([place])   . Muscle abscess   . Myofascitis   . Aspiration pneumonia of left lower lobe due to vomit ([place])   . Sacral decubitus ulcer, stage III ([place])   . Multiple sclerosis ([place])   . Protein-calorie malnutrition, severe ([place])   . Hypokalemia 03/05/2016  . Hypernatremia   . Pressure injury of skin 03/03/2016  . 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 Community acquired pneumonia 03/02/2016  . UTI (urinary tract infection) 03/02/2016  . Abnormal LFTs 03/02/2016    Past Medical History: Past Medical History:  Diagnosis Date  . Buttock wound 03/03/2016  . MS (multiple sclerosis) ([place])   . Pneumonia 02/2016  . Protein calorie malnutrition ([place])   . Severe sepsis ([employer]) 03/03/2016  . UTI (urinary tract infection) 02/2016    Past Surgical History: Past Surgical History:  Procedure Laterality Date  . NO PAST SURGERIES      Social History: Social History  Substance Use Topics  . Smoking status: Former Smoker    Packs/day: 1.00    Types: Cigarettes    Quit date: 02/01/2010  . Smokeless tobacco: Never Used  . Alcohol use No   Additional social history: Lives at [facility]. Has 57-yo son.  Please also refer to relevant sections of EMR.  Family History: Family History  Problem Relation Age of Onset  . Mental illness Sister    Allergies and Medications: No Known Allergies No current facility-administered medications on file prior to encounter.    Current Outpatient Prescriptions on File Prior to Encounter  Medication Sig Dispense Refill  . acetaminophen (TYLENOL) 325 MG tablet Take 650 mg by mouth every morning.     . Amino Acids-Protein Hydrolys (FEEDING SUPPLEMENT, PRO-STAT SUGAR FREE 64,) LIQD Take 30 mLs by mouth 3 (three) times daily with meals.    . bisacodyl (DULCOLAX) 5  MG EC tablet Take 1 tablet (5 mg total) by mouth daily as needed for moderate constipation. 30 tablet 0  . cyanocobalamin 1000 MCG tablet Take 1,000 mcg by mouth daily.    . ferrous sulfate 325 (65 FE) MG tablet Take 1 tablet (325 mg total) by mouth 2 (two) times daily with a meal. 60 tablet 0  . mirtazapine (REMERON) 15 MG tablet Take 15 mg by mouth at bedtime. Take one half tablet    . Multiple Vitamins-Minerals (DECUBI-VITE) CAPS Take 1 capsule by mouth daily.    . Nutritional Supplements (NUTRITIONAL SUPPLEMENT PO) Take 120 mLs by mouth 3 (three) times daily as needed. House Supplement - Med Pass    . polyethylene glycol (MIRALAX / GLYCOLAX) packet Take 17 g by mouth daily.    . vitamin C (ASCORBIC ACID) 500 MG tablet Take 500 mg by mouth 2 (two) times daily.      Objective: BP (!) 84/61   Pulse (!) 112   Temp 99.1 F (37.3 C) (Oral)   Resp 17   SpO2 100%  Exam: General: Cachetic younger female, very pleasant, in NAD Eyes: PERRLA, EOMI ENTM: MM very dry, with lips cracked. Oropharynx normal.  Neck: FROM, Supple Cardiovascular: Tachycardic, RRR, no mrg. DP pulses strong bilaterally.  Respiratory: Lungs CTAB. Speaking easily in complete sentences.  Gastrointestinal: +BS, abdomen firm though non-tender MSK: Contractures at knees and ankles. Very decreased tone.  Derm: Heels wrapped in gauze. Could not examine sacrum.  Neuro: Speech slightly dysarthric. Near-hemiparesis of LEs. UE strength 5/5, including grip strength. Psych: Normal mood and affect.   Labs and Imaging: CBC BMET   Recent Labs Lab 07/07/16 1625  WBC 10.8*  HGB 10.4*  HCT 34.9*  PLT 661*    Recent Labs Lab 07/07/16 1625  NA 139  K 3.5  CL 101  CO2 27  BUN 7  CREATININE 0.46  GLUCOSE 154*  CALCIUM 8.8*     Dg Chest Port 1 View  Result Date: 07/07/2016 CLINICAL DATA:  41 year old with current history of multiple sclerosis, presenting with acute onset of fever. Personal history of pneumonia earlier  this year. EXAM: PORTABLE CHEST 1 VIEW COMPARISON:  CT chest 04/07/2016. Chest x-rays 04/06/2016, 03/07/2016 and earlier. FINDINGS: Patient is slightly rotated to the left. Cardiomediastinal silhouette unremarkable, unchanged. Lungs clear. Bronchovascular markings normal. Pulmonary vascularity normal. No visible pleural effusions. No pneumothorax. IMPRESSION: No acute cardiopulmonary disease. Electronically Signed   By: Evangeline Dakin M.D.   On: 07/07/2016 15:51   Rogue Bussing, MD 07/07/2016, 8:58 PM PGY-2, [place] [facility] pager: [phone], text pages welcome

## 2016-07-07 NOTE — ED Notes (Signed)
Pt bedding changed and pt cleaned within limitations of pt pain tolerance.

## 2016-07-07 NOTE — ED Notes (Signed)
Bladder scan showed 20ml.

## 2016-07-07 NOTE — Progress Notes (Signed)
Pharmacy Antibiotic Note  Deanna Baldwin is a 41 y.o. female admitted on 07/07/2016 with sepsis.  Pharmacy has been consulted for vancomycin and zosyn dosing. WBC 10.8, afebrile, and LA 2.47. SCr 0.46 (baseline around 0.4). SCr may be falsely low due to history of spastic quadriplegia and MS. Estimated CrCl ~ 60 mL/min.   Plan: Vancomycin 1g IV x1, then 1 g IV q24hr  Zosyn 3.375g IV q8hr Vancomycin level at [facility] and as needed  Monitor renal function, clinical picture, culture results F/u length of therapy  Temp (24hrs), Avg:99.1 F (37.3 C), Min:99.1 F (37.3 C), Max:99.1 F (37.3 C)   Recent Labs Lab 07/07/16 1625 07/07/16 1633  WBC 10.8*  --   CREATININE 0.46  --   LATICACIDVEN  --  2.47*    Estimated Creatinine Clearance: 59 mL/min (A) (by C-G formula based on SCr of 0.2 mg/dL (A)).    No Known Allergies  Antimicrobials this admission: 6/6 Vanc >> 6/6 Zosyn >>   Dose adjustments this admission: n/a   Microbiology results: pending   Argie Ramming, PharmD Pharmacy Resident  Pager [phone] 07/07/16 4:16 PM

## 2016-07-07 NOTE — ED Notes (Signed)
Charge nurse at [facility] contacted and states pt had 18 FR. 30 cc ballon cath that "fell ou". Same size /type request from materials management.

## 2016-07-07 NOTE — ED Provider Notes (Addendum)
Assumed care from Dr. Rex Kras at 4 PM. Briefly, the patient is a 41 y.o. female with PMHx of  has a past medical history of Buttock wound (03/03/2016); MS (multiple sclerosis) ([place]); Pneumonia (02/2016); Protein calorie malnutrition ([place]); Severe sepsis ([place]) (03/03/2016); and UTI (urinary tract infection) (02/2016). here with need for cath replacement. Since arrival there has been moderate difficulty with cath placement. While waiting, pt noted to be febrile however so bcx, labs sent.  Labs Reviewed  CULTURE, BLOOD (ROUTINE X 2)  CULTURE, BLOOD (ROUTINE X 2)  URINE CULTURE  COMPREHENSIVE METABOLIC PANEL  CBC WITH DIFFERENTIAL/PLATELET  URINALYSIS, ROUTINE W REFLEX MICROSCOPIC  I-STAT CG4 LACTIC ACID, ED    Course of Care: -Lactic acid elevated at 2.47. On review of records, patient is tachycardic, hypotensive, and febrile. Concern for sepsis secondary to UTI. Code sepsis initiated and patient given broad-spectrum antibiotics. -Patient with persistent borderline blood pressures, although she is normally mildly hypertensive at baseline. Her heart rate is improving after fever control and fluids. Urinalysis is consistent with significant UTI. Patient given an additional fluid bolus and will admit to stepdown.On my assessment, the patient is nontoxic. She is mentating without difficulty. Her distal capillary refill and perfusion seems normal. Abdomen is soft without rebound or guarding.  CRITICAL CARE Performed by: Evonnie Pat   Total critical care time: 35 minutes  Critical care time was exclusive of separately billable procedures and treating other patients.  Critical care was necessary to treat or prevent imminent or life-threatening deterioration.  Critical care was time spent personally by me on the following activities: development of treatment plan with patient and/or surrogate as well as nursing, discussions with consultants, evaluation of patient's response to treatment, examination  of patient, obtaining history from patient or surrogate, ordering and performing treatments and interventions, ordering and review of laboratory studies, ordering and review of radiographic studies, pulse oximetry and re-evaluation of patient's condition.       Duffy Bruce, MD 07/07/16 2957    Duffy Bruce, MD 07/07/16 [phone]

## 2016-07-07 NOTE — ED Notes (Signed)
Pt eating sandwich and taking po fluidds well.

## 2016-07-07 NOTE — ED Notes (Signed)
EDP placed Foley Catheter. <77ml of urine return noted.

## 2016-07-07 NOTE — ED Notes (Signed)
Foleyn placement attempted wihtout success. Pt also noted as temp of 100.9. Oral. Will inform MD.

## 2016-07-07 NOTE — ED Notes (Signed)
I-stat lactic acid result given to Dr. Ellender Hose

## 2016-07-08 ENCOUNTER — Ambulatory Visit: Payer: Self-pay | Admitting: Neurology

## 2016-07-08 ENCOUNTER — Telehealth: Payer: Self-pay | Admitting: *Deleted

## 2016-07-08 DIAGNOSIS — L8994 Pressure ulcer of unspecified site, stage 4: Secondary | ICD-10-CM

## 2016-07-08 DIAGNOSIS — N319 Neuromuscular dysfunction of bladder, unspecified: Secondary | ICD-10-CM

## 2016-07-08 DIAGNOSIS — A419 Sepsis, unspecified organism: Principal | ICD-10-CM

## 2016-07-08 DIAGNOSIS — T839XXA Unspecified complication of genitourinary prosthetic device, implant and graft, initial encounter: Secondary | ICD-10-CM

## 2016-07-08 DIAGNOSIS — R000 Tachycardia, unspecified: Secondary | ICD-10-CM

## 2016-07-08 HISTORY — DX: Pressure ulcer of unspecified site, stage 4: L89.94

## 2016-07-08 LAB — BASIC METABOLIC PANEL
Anion gap: 10 (ref 5–15)
CALCIUM: 8 mg/dL — AB (ref 8.9–10.3)
CO2: 24 mmol/L (ref 22–32)
CREATININE: 0.3 mg/dL — AB (ref 0.44–1.00)
Chloride: 105 mmol/L (ref 101–111)
GFR calc Af Amer: >60 mL/min (ref >60)
Glucose, Bld: 129 mg/dL — ABNORMAL HIGH (ref 65–99)
Potassium: 3.2 mmol/L — ABNORMAL LOW (ref 3.5–5.1)
Sodium: 139 mmol/L (ref 135–145)

## 2016-07-08 LAB — CBC
HCT: 26.3 % — ABNORMAL LOW (ref 36.0–46.0)
Hemoglobin: 7.7 g/dL — ABNORMAL LOW (ref 12.0–15.0)
MCH: 25.9 pg — ABNORMAL LOW (ref 26.0–34.0)
MCHC: 29.3 g/dL — AB (ref 30.0–36.0)
MCV: 88.6 fL (ref 78.0–100.0)
Platelets: 463 10*3/uL — ABNORMAL HIGH (ref 150–400)
RBC: 2.97 MIL/uL — ABNORMAL LOW (ref 3.87–5.11)
RDW: 15.2 % (ref 11.5–15.5)
WBC: 7.9 10*3/uL (ref 4.0–10.5)

## 2016-07-08 LAB — LACTIC ACID, PLASMA
Lactic Acid, Venous: 1.4 mmol/L (ref 0.5–1.9)
Lactic Acid, Venous: 2.3 mmol/L (ref 0.5–1.9)

## 2016-07-08 LAB — MRSA PCR SCREENING: MRSA by PCR: POSITIVE — AB

## 2016-07-08 LAB — CORTISOL: Cortisol, Plasma: 10.3 ug/dL

## 2016-07-08 MED ORDER — MUPIROCIN 2 % EX OINT
1.0000 "application " | TOPICAL_OINTMENT | Freq: Two times a day (BID) | CUTANEOUS | Status: DC
  Administered 2016-07-08 – 2016-07-10 (×5): 1 via NASAL
  Filled 2016-07-08 (×2): qty 22

## 2016-07-08 MED ORDER — POTASSIUM CHLORIDE CRYS ER 20 MEQ PO TBCR
40.0000 meq | EXTENDED_RELEASE_TABLET | Freq: Two times a day (BID) | ORAL | Status: DC
  Administered 2016-07-08 – 2016-07-09 (×4): 40 meq via ORAL
  Filled 2016-07-08 (×5): qty 2

## 2016-07-08 MED ORDER — ADULT MULTIVITAMIN W/MINERALS CH
1.0000 | ORAL_TABLET | Freq: Every day | ORAL | Status: DC
  Administered 2016-07-08 – 2016-07-10 (×3): 1 via ORAL
  Filled 2016-07-08 (×3): qty 1

## 2016-07-08 MED ORDER — SODIUM CHLORIDE 0.9 % IV SOLN
INTRAVENOUS | Status: DC
  Administered 2016-07-08: 03:00:00 via INTRAVENOUS

## 2016-07-08 MED ORDER — CHLORHEXIDINE GLUCONATE CLOTH 2 % EX PADS
6.0000 | MEDICATED_PAD | Freq: Every day | CUTANEOUS | Status: DC
  Administered 2016-07-09 – 2016-07-10 (×2): 6 via TOPICAL

## 2016-07-08 MED ORDER — ENSURE ENLIVE PO LIQD
237.0000 mL | Freq: Two times a day (BID) | ORAL | Status: DC
  Administered 2016-07-09 – 2016-07-10 (×3): 237 mL via ORAL

## 2016-07-08 MED ORDER — COLLAGENASE 250 UNIT/GM EX OINT
TOPICAL_OINTMENT | Freq: Every day | CUTANEOUS | Status: DC
  Administered 2016-07-08 – 2016-07-09 (×2): via TOPICAL
  Filled 2016-07-08 (×2): qty 30

## 2016-07-08 MED ORDER — SODIUM CHLORIDE 0.9 % IV BOLUS (SEPSIS)
1000.0000 mL | Freq: Once | INTRAVENOUS | Status: AC
  Administered 2016-07-08: 1000 mL via INTRAVENOUS

## 2016-07-08 NOTE — H&P (Addendum)
[place] Nurse wound consult note Reason for Consult: Consult requested for several wounds.  Pt has multiple systemic factors which can impair healing; emaciated, immobile, incontinent, contracted. Upper sacrum chronic stage 4: 2X.5X.2cm, red and moist, mod amt pink drainage, bone palpable Lower sacrum chronic stage 4: 5X3X.2cm, red and moist, mod amt pink drainage bone palpable Right heel with unstageable chronic wound; 2.5X1.5cm, yellow slough with small amt tan drainage, some odor. Left outer foot with 2 chronic stage 4 wounds; 2X1.5X.2cm and 2X2.5X.2cm, both red and moist, mod amt pink drainage, no odor, bone palpable. Left thigh with full thickness wound; 3X1X.2cm, red and moist, no odor Left ischium with unstageable pressure injury; 5X3cm, yellow slough, mod amt tan drainage, some odor Right hip with deep tissue injury; .5X.5cm, darker-colored intact skin Pressure Injury POA: Yes Dressing procedure/placement/frequency: Santyl for enzymatic debridement of nonviable tissue to left ischium and right heel unstageable pressure injuries.  Air mattress to decrease pressure, float heels.  Aquacel to absorb drainage and promote healing to other wounds. Discussed plan of care with patient and she verbalized understanding.  One hour spent performing this consult. Please re-consult if further assistance is needed.  Thank-you,  Julien Girt MSN, [place], [place], [place], [place]

## 2016-07-08 NOTE — Discharge Summary (Signed)
[facility] Discharge Summary  Patient name: [place] record number: [id number] Date of birth: Nov 16, 1975 Age: 41 y.o. Gender: female Date of Admission: 07/07/2016  Date of Discharge: 07/10/16 Admitting Physician: Kinnie Feil, MD  Primary Care Provider: [name], DO Consultants: none  Indication for Hospitalization: sepsis  Discharge Diagnoses/Problem List:  Multiple Sclerosis with spastic paraplegia Indwelling Foley catheter Sepsis secondary to Proteus UTI Stage 4 pressure ulcer Stage 3 pressure ulcer Severe protein calorie malnutrition  Disposition: SNF  Discharge Condition: stable, improved  Discharge Exam: see progress note from day of discharge  Brief Hospital Course:  Gradie [place] is a 41 year old female with PMH of aggressive multiple sclerosis who presented to [facility] ED for Foley catheter change. She was found to be tachycardic, tachypnic, hypotensive, and febrile in the ED prompting sepsis protocol. She was given fluid resuscitation and started on broad spectrum antibiotics. Urinalysis was concerning for UTI which is expected due to her chronic indwelling Foley. CXR was negative. She was admitted to [facility] for further management. She remained persistently hypotensive and tachycardic despite fluid boluses and through chart review it appears she is hypotensive and tachycardic at her baseline. She was intermittently febrile with fever curve trending down during her stay. Of note patient is intermittently febrile as an outpatient and this is thought to be secondary to autonomic dysfunction. Blood cultures were negative at time of discharge. Urine culture grew Proteus Mirabilis pansensitive except to macrobid. Antibiotics were changed to oral keflex 500mg  QID for a total of 14days. KUB was negative for a staghorn calculus.  Issues for Follow Up:  1. Continue po keflex 500mg  QID antibiotics for total of 14 day course, last dose on  07/20/16. 2. Patient missed dose of Rituxin due to hospitalization, [facility] notified but patient will need to reschedule this appointment 3. Wound care recommendations:  Dressing procedure/placement/frequency: Santyl for enzymatic debridement of nonviable tissue to left ischium and right heel unstageable pressure injuries.  Air mattress to decrease pressure, float heels.  Aquacel to absorb drainage and promote healing to other wounds. Discussed plan of care with patient and she verbalized understanding.  Significant Procedures: none  Significant Labs and Imaging:   Recent Labs Lab 07/08/16 0258 07/09/16 0801 07/10/16 0642  WBC 7.9 7.3 7.4  HGB 7.7* 9.0* 10.4*  HCT 26.3* 30.7* 35.9*  PLT 463* 521* 349    Recent Labs Lab 07/07/16 1625 07/08/16 0258 07/09/16 0801 07/10/16 0642  NA 139 139 141 141  K 3.5 3.2* 4.8 5.6*  CL 101 105 108 104  CO2 27 24 24 27   GLUCOSE 154* 129* 82 89  BUN 7 <5* 5* 8  CREATININE 0.46 0.30* 0.31* 0.39*  CALCIUM 8.8* 8.0* 9.1 9.4  ALKPHOS 203*  --   --   --   AST 62*  --   --   --   ALT 66*  --   --   --   ALBUMIN 2.4*  --   --   --    Dg Chest Port 1 View  Result Date: 07/07/2016 CLINICAL DATA:  41 year old with current history of multiple sclerosis, presenting with acute onset of fever. Personal history of pneumonia earlier this year. EXAM: PORTABLE CHEST 1 VIEW COMPARISON:  CT chest 04/07/2016. Chest x-rays 04/06/2016, 03/07/2016 and earlier. FINDINGS: Patient is slightly rotated to the left. Cardiomediastinal silhouette unremarkable, unchanged. Lungs clear. Bronchovascular markings normal. Pulmonary vascularity normal. No visible pleural effusions. No pneumothorax. IMPRESSION: No acute  cardiopulmonary disease. Electronically Signed   By: Evangeline Dakin M.D.   On: 07/07/2016 15:51   Dg Abd 1 View  Result Date: 07/10/2016 CLINICAL DATA:  Urinary tract infection EXAM: ABDOMEN - 1 VIEW COMPARISON:  CT abdomen and pelvis Jun 25, 2008 FINDINGS: There is diffuse stool throughout the colon. There is a paucity of small bowel gas. There is no bowel dilatation or air-fluid level to suggest bowel obstruction. No free air. There are scattered probable vascular calcifications in the abdomen. Lung bases are clear. IMPRESSION: Diffuse stool throughout colon. Paucity of small bowel gas may be seen normally but also may be seen with early ileus or enteritis. Lung bases clear. No evident free air. Electronically Signed   By: Lowella Grip III M.D.   On: 07/10/2016 12:05    Results/Tests Pending at Time of Discharge: final blood culture results  Discharge Medications:  Allergies as of 07/10/2016   No Known Allergies     Medication List    TAKE these medications   acetaminophen 325 MG tablet Commonly known as:  TYLENOL Take 650 mg by mouth every morning.   acetaminophen 325 MG tablet Commonly known as:  TYLENOL Take 650 mg by mouth every 6 (six) hours as needed for mild pain.   bisacodyl 5 MG EC tablet Commonly known as:  DULCOLAX Take 1 tablet (5 mg total) by mouth daily as needed for moderate constipation.   cephALEXin 500 MG capsule Commonly known as:  KEFLEX Take 1 capsule (500 mg total) by mouth every 6 (six) hours.   collagenase ointment Commonly known as:  SANTYL Apply topically daily.   cyanocobalamin 1000 MCG tablet Take 1,000 mcg by mouth daily.   DECUBI-VITE Caps Take 1 capsule by mouth daily.   feeding supplement (PRO-STAT SUGAR FREE 64) Liqd Take 30 mLs by mouth 3 (three) times daily with meals.   ferrous sulfate 325 (65 FE) MG tablet Take 1 tablet (325 mg total) by mouth 2 (two) times daily with a meal.   mirtazapine 15 MG tablet Commonly known as:  REMERON Take 15 mg by mouth at bedtime. Take one half tablet   NUTRITIONAL SUPPLEMENT PO Take 120 mLs by mouth 3 (three) times daily as needed. House Supplement - Med Pass   polyethylene glycol packet Commonly known as:  MIRALAX /  GLYCOLAX Take 17 g by mouth daily.   vitamin C 500 MG tablet Commonly known as:  ASCORBIC ACID Take 500 mg by mouth 2 (two) times daily.       Discharge Instructions: Please refer to Patient Instructions section of EMR for full details.  Patient was counseled important signs and symptoms that should prompt return to medical care, changes in medications, dietary instructions, activity restrictions, and follow up appointments.   Follow-Up Appointments: [place], [place] [facility] Follow up.   Specialty:  [place] information: [place] [address] [place], [place], DO 07/10/2016, 3:40 PM PGY-1, [place]

## 2016-07-08 NOTE — Progress Notes (Signed)
Pt. Admitted to 3M03 with chronic foley catheter but no documentation of the foley in the flowsheet. Per ED report the pt. came into the ED without a foley and urology inserted a new one. Per report it is also noted there is chronic leakage from the catheter due to a deteriorating urethra. With no knowledge of how the foley was inserted, who inserted it and what measures were taken I cannot document a new LDA. Upon assessment the catheter is intact with leakage around the insertion site. Pt. does not report any pain. Standard drainage bag that is below the level of the bed. Will continue to monitor.    Mayford Knife, RN

## 2016-07-08 NOTE — Progress Notes (Signed)
Paged family medicine to inform that foley was leaking. Deflated foley and got 10 ml back. Repositioned foley and urine was still leaking out.   Will continue to monitor  Paulla Fore, RN

## 2016-07-08 NOTE — Telephone Encounter (Signed)
No showed Xeomin injection appt.

## 2016-07-08 NOTE — Progress Notes (Signed)
Family Medicine Teaching Service Daily Progress Note Intern Pager: [phone]  Patient name: Deanna Baldwin record number: [id number] Date of birth: 04-23-1975 Age: 41 y.o. Gender: female  Primary Care Provider: [name], DO Consultants: none Code Status: Full  Pt Overview and Major Events to Date:  6/6- admitted to [address] for urosepsis  Assessment and Plan: Deanna Baldwin is a 41 y.o. female presenting with dislodged foley catheter and found to meet sepsis criteria. 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.   Sepsis: qSofa 2. Tachycardic in mid-100s and soft BPs 90s/50s (though history of both according to records). - repeat NaCl bolus as needed  - Follow blood, urine cultures - Continue vanc/zosyn until cultures result - Bowel regimen - Tylenol prn for fever  Persistent tachycardia: Persistent, appears to be patient's baseline. Present during last hospitalization in March 2018. Thought to be 2/2 frailty and poor functional status.  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 Due for next rituxan infusion 6/7. Receives infusions every 6 months.  - Patient plans to call [facility] to let them know about hospitalization - Turn patient q4h - Dysphagia 3 with honey thickened liquids  Anemia: Stable. Hgb 10.4 on admission. New BL ~ 10. - hold home ferrous sulfate until patient adequately treated for bacterial infection - monitor CBC  Severe Chronic Protein Malnutrition: Albumin 2.4.  - Continue home pro-stat  - Consulted Nutrition, appreciate recs  Heel wounds and recent sacral decubitus ulcer - Wound consult, appreciate recs  FEN/GI: Dysphagia 3 with honey thickened liquids Prophylaxis: Lovenox  Disposition: back to SNF ([facility]) once clinically improved  Subjective:  [name] is feeling  well this morning, denies fevers, chills. Good appetite. No pain anywhere.   Objective: Temp:  [98.5 F (36.9 C)-100 F (37.8 C)] 98.5 F (36.9 C) (06/07 0728) Pulse Rate:  [103-148] 114 (06/07 0728) Resp:  [16-29] 23 (06/07 0728) BP: (84-105)/(55-72) 87/59 (06/07 0728) SpO2:  [94 %-100 %] 100 % (06/07 0728) Weight:  [90 lb 6.2 oz (41 kg)] 90 lb 6.2 oz (41 kg) (06/06 2330) Physical Exam: General: frail chronically ill appearing lady laying in bed in NAD Cardiovascular: tachycardic, regular rhythm. No MRG Respiratory: CTAB, no increased WOB Abdomen: soft, NTND, +BS Extremities: thin extremities with muscle wasting, LE chronically contracted  Laboratory:  Recent Labs Lab 07/07/16 1625 07/08/16 0258  WBC 10.8* 7.9  HGB 10.4* 7.7*  HCT 34.9* 26.3*  PLT 661* 463*    Recent Labs Lab 07/07/16 1625 07/08/16 0258  NA 139 139  K 3.5 3.2*  CL 101 105  CO2 27 24  BUN 7 <5*  CREATININE 0.46 0.30*  CALCIUM 8.8* 8.0*  PROT 6.5  --   BILITOT 0.4  --   ALKPHOS 203*  --   ALT 66*  --   AST 62*  --   GLUCOSE 154* 129*   LA 2.3 > 1.4  Imaging/Diagnostic Tests: Dg Chest Port 1 View  Result Date: 07/07/2016 CLINICAL DATA:  41 year old with current history of multiple sclerosis, presenting with acute onset of fever. Personal history of pneumonia earlier this year. EXAM: PORTABLE CHEST 1 VIEW COMPARISON:  CT chest 04/07/2016. Chest x-rays 04/06/2016, 03/07/2016 and earlier. FINDINGS: Patient is slightly rotated to the left. Cardiomediastinal silhouette unremarkable, unchanged. Lungs clear. Bronchovascular markings normal. Pulmonary vascularity normal. No visible pleural effusions. No pneumothorax. IMPRESSION: No acute  cardiopulmonary disease. Electronically Signed   By: Evangeline Dakin M.D.   On: 07/07/2016 15:51     Steve Rattler, DO 07/08/2016, 9:40 AM PGY-1, [place] Intern pager: [phone], text pages welcome

## 2016-07-08 NOTE — Progress Notes (Signed)
CRITICAL VALUE ALERT  Critical Value:  Lactic Acid 2.3  Date & Time Notified:  07/08/16 @ 0113  Provider Notified: Olene Floss, MD  Orders Received/Actions taken: NS 1,047ml bolus

## 2016-07-08 NOTE — Telephone Encounter (Signed)
Dr riccio/[facility] called to advise the patient is in the hospital

## 2016-07-08 NOTE — Progress Notes (Signed)
Initial Nutrition Assessment  DOCUMENTATION CODES:   Severe malnutrition in context of chronic illness, Underweight  INTERVENTION:   -Magic cup TID with meals, each supplement provides 290 kcal and 9 grams of protein -Continue Pro-Stat BID -Recommend MVI daily -Recommend SLP evaluation to further evaluate appropriate diet consistency for pt  -Pt is severely chronically malnourished with chronic stage IV pressure ulcers and diagnosis of aggressive MS. Pt would like benefit from G-tube placement for supplemental nutrition (and potentially sole-source nutrition in the future) if aggressive intervention is warranted.   NUTRITION DIAGNOSIS:   Malnutrition (Severe) related to chronic illness (MS, chronic wounds) as evidenced by severe depletion of muscle mass, severe depletion of body fat.  GOAL:   Patient will meet greater than or equal to 90% of their needs  MONITOR:   PO intake, Supplement acceptance, Labs, Weight trends  REASON FOR ASSESSMENT:   Consult Assessment of nutrition requirement/status  ASSESSMENT:   41 yo female presenting with dislodged foley catheter and found to meet sepsis criteria  Pt with MS with paraplegia, multiple chronic wounds.    Recorded po intake 75% at lunch today. Pt reports good appetite and that she eats 3 meals per day. Reports she loves the [employer]. Pt also reports she likes to drink Ensure when she has it.   Pt reports UBW of 110 pounds, reports she has always been small/thin. 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 Current wt of 90 pounds. Pt has actually gained weight recently.   Wt Readings from Last 10 Encounters:  07/07/16 90 lb 6.2 oz (41 kg)  06/30/16 87 lb 3.2 oz (39.6 kg)  06/24/16 87 lb (39.5 kg)  06/23/16 87 lb 3.2 oz (39.6 kg)  06/22/16 87 lb 3.2 oz (39.6 kg)  06/15/16 85 lb 1.6 oz (38.6 kg)  05/25/16 87 lb (39.5 kg)  05/18/16 87 lb 8 oz (39.7 kg)  05/11/16 87 lb 8 oz (39.7 kg)   04/27/16 83 lb 11.2 oz (38 kg)   Nutrition-Focused physical exam completed. Findings are severe fat depletion, severe muscle depletion, and no edema.   Labs: potassium 3.2 MedS: NS at 50 ml/hr, remeron  Diet Order:  DIET DYS 3 Room service appropriate? Yes; Fluid consistency: Honey Thick  Skin:  Wound (see comment) (multiple stage IV pressure ulcers)  Last BM:  07/08/16  Height:   Ht Readings from Last 1 Encounters:  07/08/16 5\' 9"  (1.753 m)    Weight:   Wt Readings from Last 1 Encounters:  07/08/16 90 lb 6.2 oz (41 kg)    Ideal Body Weight:  53.4 kg (taking paraplegia into account and small body frame)  BMI:  Body mass index is 13.35 kg/m.  Estimated Nutritional Needs:   Kcal:  1500-1800 kcals  Protein:  up to 82 g/day  Fluid:  >/= 1.5 L  EDUCATION NEEDS:   No education needs identified at this time  [place], Clay, LDN [phone] Pager  [phone] Weekend/On-Call Pager

## 2016-07-08 NOTE — Care Management Note (Addendum)
Case Management Note  Patient Details  Name: Deanna Baldwin MRN: [id number] Date of Birth: 1975-08-18  Subjective/Objective:   From [facility] SN[facility], long term resident, presents with Sepsis , persistent tachycardia, MS, anemia, severe chronic PCM, heel wound and sacral decubitus ulcer.  She has [employer].  PCP [name]                  Action/Plan: NCM will follow along with CSW for dc needs.  Expected Discharge Date:                  Expected Discharge Plan:  Skilled Nursing Facility  In-House Referral:  Clinical Social Work  Discharge planning Services  CM Consult  Post Acute Care Choice:    Choice offered to:     DME Arranged:    DME Agency:     HH Arranged:    [place] Agency:     Status of Service:  Completed, signed off  If discussed at [employer], dates discussed:    Additional Comments:  Zenon Mayo, RN 07/08/2016, 12:03 PM

## 2016-07-09 DIAGNOSIS — E538 Deficiency of other specified B group vitamins: Secondary | ICD-10-CM | POA: Insufficient documentation

## 2016-07-09 DIAGNOSIS — T839XXD Unspecified complication of genitourinary prosthetic device, implant and graft, subsequent encounter: Secondary | ICD-10-CM

## 2016-07-09 DIAGNOSIS — A419 Sepsis, unspecified organism: Secondary | ICD-10-CM

## 2016-07-09 DIAGNOSIS — G35 Multiple sclerosis: Secondary | ICD-10-CM

## 2016-07-09 DIAGNOSIS — N390 Urinary tract infection, site not specified: Secondary | ICD-10-CM

## 2016-07-09 HISTORY — DX: Deficiency of other specified B group vitamins: E53.8

## 2016-07-09 LAB — CBC
HCT: 30.7 % — ABNORMAL LOW (ref 36.0–46.0)
Hemoglobin: 9 g/dL — ABNORMAL LOW (ref 12.0–15.0)
MCH: 26.2 pg (ref 26.0–34.0)
MCHC: 29.3 g/dL — ABNORMAL LOW (ref 30.0–36.0)
MCV: 89.5 fL (ref 78.0–100.0)
PLATELETS: 521 10*3/uL — AB (ref 150–400)
RBC: 3.43 MIL/uL — AB (ref 3.87–5.11)
RDW: 15.1 % (ref 11.5–15.5)
WBC: 7.3 10*3/uL (ref 4.0–10.5)

## 2016-07-09 LAB — BASIC METABOLIC PANEL
ANION GAP: 9 (ref 5–15)
BUN: 5 mg/dL — ABNORMAL LOW (ref 6–20)
CHLORIDE: 108 mmol/L (ref 101–111)
CO2: 24 mmol/L (ref 22–32)
Calcium: 9.1 mg/dL (ref 8.9–10.3)
Creatinine, Ser: 0.31 mg/dL — ABNORMAL LOW (ref 0.44–1.00)
Glucose, Bld: 82 mg/dL (ref 65–99)
POTASSIUM: 4.8 mmol/L (ref 3.5–5.1)
SODIUM: 141 mmol/L (ref 135–145)

## 2016-07-09 MED ORDER — BENEPROTEIN PO POWD
6.0000 g | ORAL | Status: DC | PRN
  Filled 2016-07-09: qty 227

## 2016-07-09 MED ORDER — STARCH (THICKENING) PO POWD: ORAL | Status: DC | PRN

## 2016-07-09 NOTE — Progress Notes (Signed)
Pharmacy Antibiotic Note  Dalana Pfahler is a 41 y.o. female admitted on 07/07/2016 with sepsis.  Pharmacy has been consulted for Zosyn dosing.   SCr 0.31 (baseline around 0.4). SCr may be falsely low due to history of spastic quadriplegia and MS.   Plan: Zosyn 3.375g IV q8hr Monitor renal function, clinical picture, culture results, and length of therapy  Temp (24hrs), Avg:99.1 F (37.3 C), Min:97.7 F (36.5 C), Max:102.9 F (39.4 C)   Recent Labs Lab 07/07/16 1625 07/07/16 1633 07/07/16 2358 07/08/16 0258 07/09/16 0801  WBC 10.8*  --   --  7.9 7.3  CREATININE 0.46  --   --  0.30* 0.31*  LATICACIDVEN  --  2.47* 2.3* 1.4  --     Estimated Creatinine Clearance: 64.5 mL/min (A) (by C-G formula based on SCr of 0.31 mg/dL (L)).    No Known Allergies  Antimicrobials this admission: 6/6 Vanc >> 6/8 6/6 Zosyn >>   Dose adjustments this admission: n/a   Microbiology results:   6/6 blood x 2: ngtd 6/6 urine: >100K GNR 6/6 MRSA PCR POS  Jude Linck D. Lamona Eimer, PharmD, BCPS Clinical Pharmacist Pager: [phone] Clinical Phone for 07/09/2016 until 3:30pm: [phone] If after 3:30pm, please call main pharmacy at [phone] 07/09/2016 11:44 AM

## 2016-07-09 NOTE — Progress Notes (Signed)
Family Medicine Teaching Service Daily Progress Note Intern Pager: [phone]  Patient name: Deanna Baldwin record number: [id number] Date of birth: Mar 21, 1975 Age: 41 y.o. Gender: female  Primary Care Provider: [name], DO Consultants: none Code Status: Full  Pt Overview and Major Events to Date:  6/6- admitted to [facility] for urosepsis  Assessment and Plan: Deanna Baldwin is a 41 y.o. female presenting with dislodged foley catheter and found to meet sepsis criteria. 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   Urosepsis: Patient with indwelling foley. Urine Cx >100K gram neg rods. Blood culture NGTD. Continues to be tachycardic in mid-100s and soft BPs 90s/50s (though history of both per chart review). Tmax 102.37F overnight. - repeat NaCl bolus as needed  - Follow urine culture for speciation and sensitivities. - Continue zosyn, discontinue vanc. - Bowel regimen - Tylenol prn for fever  Persistent tachycardia: Persistent, appears to be patient's baseline. Present during last hospitalization in March 2018. Thought to be 2/2 frailty and poor functional status.  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 Due for next rituxan infusion 6/7. Receives infusions every 6 months.  - Patient plans to call WF to let them know about hospitalization - Turn patient q4h - Dysphagia 3 with honey thickened liquids  Anemia: Stable.  New BL ~ 10. - hold home ferrous sulfate until patient adequately treated for bacterial infection - monitor CBC  Severe Chronic Protein Malnutrition: Albumin 2.4.  - Continue home pro-stat  - Consulted Nutrition, appreciate recs  Heel wounds and recent sacral decubitus ulcer - Wound consult, appreciate recs  FEN/GI: Dysphagia 3 with honey thickened liquids Prophylaxis:  Lovenox  Disposition: back to SNF ([facility]) once clinically improved  Subjective:  Overnight, foley changed due to leaking. States feeling well this morning, Denies fevers, chills.  No pain anywhere.   Objective: Temp:  [97.7 F (36.5 C)-102.9 F (39.4 C)] 97.7 F (36.5 C) (06/08 0418) Pulse Rate:  [93-144] 93 (06/08 0418) Resp:  [16-25] 18 (06/08 0418) BP: (85-96)/(40-62) 88/58 (06/08 0418) SpO2:  [96 %-100 %] 100 % (06/08 0418) Weight:  [41 kg (90 lb 6.2 oz)-43.3 kg (95 lb 7.4 oz)] 43.3 kg (95 lb 7.4 oz) (06/07 2232) Physical Exam: General: frail chronically ill appearing lady laying in bed in NAD Cardiovascular: tachycardic, regular rhythm. No MRG Respiratory: CTAB, no increased WOB Abdomen: soft, NTND, +BS Extremities: thin extremities with muscle wasting, LE chronically contracted Skin: multiple ulcers covered with dressing on legs and sacrum  Laboratory:  Recent Labs Lab 07/07/16 1625 07/08/16 0258 07/09/16 0801  WBC 10.8* 7.9 7.3  HGB 10.4* 7.7* 9.0*  HCT 34.9* 26.3* 30.7*  PLT 661* 463* 521*    Recent Labs Lab 07/07/16 1625 07/08/16 0258 07/09/16 0801  NA 139 139 141  K 3.5 3.2* 4.8  CL 101 105 108  CO2 27 24 24   BUN 7 <5* 5*  CREATININE 0.46 0.30* 0.31*  CALCIUM 8.8* 8.0* 9.1  PROT 6.5  --   --   BILITOT 0.4  --   --   ALKPHOS 203*  --   --   ALT 66*  --   --   AST 62*  --   --   GLUCOSE 154* 129* 82   LA 2.3 > 1.4  am cortisol 10.3  Imaging/Diagnostic Tests: No results found.   Bufford Lope,  DO 07/09/2016, 7:22 AM PGY-1, [place] Intern pager: [phone], text pages welcome

## 2016-07-09 NOTE — Clinical Social Work Note (Signed)
Clinical Social Work Assessment  Patient Details  Name: Deanna Baldwin MRN: [id number] Date of Birth: 1975/07/05  Date of referral:  07/09/16               Reason for consult:  Facility Placement                Permission sought to share information with:  Family Supports Permission granted to share information::  Yes, Verbal Permission Granted  Name::     Venora Maples  Agency::     Relationship::  Aunt  Contact Information:  [phone] - mobile  Housing/Transportation Living arrangements for the past 2 months:  [place] (Patient from [facility]) Source of Information:  Patient Patient Interpreter Needed:  None Criminal Activity/Legal Involvement Pertinent to Current Situation/Hospitalization:  No - Comment as needed Significant Relationships:  Other Family Members, Dependent Children Lives with:  Facility Resident Do you feel safe going back to the place where you live?  Yes Need for family participation in patient care:  Yes (Comment)  Care giving concerns:  Patient reported no concerns regarding care at SNF.   Social Worker assessment / plan: Patient reported that she is from [place] and provided CSW with a brief history of how she came to be discharged to facility. Patient has a 40 year-old son who lives with her aunt Vaughan Basta, but will be going to live with his father in [place]. Patient reported that she and her son's father get along very well.  Employment status:  Disabled (Comment on whether or not currently receiving Disability) Insurance information:  Medicare PT Recommendations:  Not assessed at this time Information / Referral to community resources:  [place] (Patient from SNF - [facility])  Patient/Family's Response to care:  No concerns expressed by patient regarding care during hospitalization.  Patient/Family's Understanding of and Emotional Response to Diagnosis, Current Treatment, and Prognosis: Not discussed.  Emotional  Assessment Appearance:  Appears stated age Attitude/Demeanor/Rapport:  Unable to Assess (Appropriate) Affect (typically observed):  Pleasant, Appropriate Orientation:  Oriented to Self, Oriented to Place, Oriented to Situation Alcohol / Substance use:  Tobacco Use, Alcohol Use, Illicit Drugs (Patient reported that she quit smoking, and does not drink or use illicit drugs.) Psych involvement (Current and /or in the community):  No (Comment)  Discharge Needs  Concerns to be addressed:  Discharge Planning Concerns Readmission within the last 30 days:  No Current discharge risk:  None Barriers to Discharge:  No Barriers Identified   Sable Feil, LCSW 07/09/2016, 7:20 PM

## 2016-07-09 NOTE — Progress Notes (Signed)
Paged family medicine MD to alert of foley catheter leaking. It had leaked yesterday and a new one was placed per MD order.    Repositioned foley catheter and MD stated to monitor leak. If still leaking then to do a bladder scan.   Will continue to monitor.  Paulla Fore, RN

## 2016-07-10 ENCOUNTER — Inpatient Hospital Stay (HOSPITAL_COMMUNITY): Payer: Medicare Other

## 2016-07-10 DIAGNOSIS — N3000 Acute cystitis without hematuria: Secondary | ICD-10-CM | POA: Diagnosis not present

## 2016-07-10 DIAGNOSIS — N10 Acute tubulo-interstitial nephritis: Secondary | ICD-10-CM

## 2016-07-10 DIAGNOSIS — L89894 Pressure ulcer of other site, stage 4: Secondary | ICD-10-CM | POA: Diagnosis not present

## 2016-07-10 DIAGNOSIS — T83198A Other mechanical complication of other urinary devices and implants, initial encounter: Secondary | ICD-10-CM | POA: Diagnosis not present

## 2016-07-10 DIAGNOSIS — L89009 Pressure ulcer of unspecified elbow, unspecified stage: Secondary | ICD-10-CM | POA: Diagnosis not present

## 2016-07-10 DIAGNOSIS — N319 Neuromuscular dysfunction of bladder, unspecified: Secondary | ICD-10-CM | POA: Diagnosis present

## 2016-07-10 DIAGNOSIS — M50222 Other cervical disc displacement at C5-C6 level: Secondary | ICD-10-CM | POA: Diagnosis not present

## 2016-07-10 DIAGNOSIS — G35 Multiple sclerosis: Secondary | ICD-10-CM | POA: Diagnosis not present

## 2016-07-10 DIAGNOSIS — G114 Hereditary spastic paraplegia: Secondary | ICD-10-CM | POA: Diagnosis not present

## 2016-07-10 DIAGNOSIS — L8992 Pressure ulcer of unspecified site, stage 2: Secondary | ICD-10-CM | POA: Diagnosis not present

## 2016-07-10 DIAGNOSIS — L89144 Pressure ulcer of left lower back, stage 4: Secondary | ICD-10-CM | POA: Diagnosis not present

## 2016-07-10 DIAGNOSIS — F4320 Adjustment disorder, unspecified: Secondary | ICD-10-CM | POA: Diagnosis not present

## 2016-07-10 DIAGNOSIS — L89890 Pressure ulcer of other site, unstageable: Secondary | ICD-10-CM | POA: Diagnosis not present

## 2016-07-10 DIAGNOSIS — R1312 Dysphagia, oropharyngeal phase: Secondary | ICD-10-CM | POA: Diagnosis not present

## 2016-07-10 DIAGNOSIS — E43 Unspecified severe protein-calorie malnutrition: Secondary | ICD-10-CM | POA: Diagnosis not present

## 2016-07-10 DIAGNOSIS — Y846 Urinary catheterization as the cause of abnormal reaction of the patient, or of later complication, without mention of misadventure at the time of the procedure: Secondary | ICD-10-CM | POA: Diagnosis present

## 2016-07-10 DIAGNOSIS — Z9289 Personal history of other medical treatment: Secondary | ICD-10-CM | POA: Diagnosis not present

## 2016-07-10 DIAGNOSIS — M791 Myalgia: Secondary | ICD-10-CM | POA: Diagnosis not present

## 2016-07-10 DIAGNOSIS — G8220 Paraplegia, unspecified: Secondary | ICD-10-CM | POA: Diagnosis not present

## 2016-07-10 DIAGNOSIS — T83498A Other mechanical complication of other prosthetic devices, implants and grafts of genital tract, initial encounter: Secondary | ICD-10-CM | POA: Diagnosis not present

## 2016-07-10 DIAGNOSIS — N368 Other specified disorders of urethra: Secondary | ICD-10-CM | POA: Diagnosis present

## 2016-07-10 DIAGNOSIS — L89154 Pressure ulcer of sacral region, stage 4: Secondary | ICD-10-CM | POA: Diagnosis not present

## 2016-07-10 DIAGNOSIS — L89614 Pressure ulcer of right heel, stage 4: Secondary | ICD-10-CM | POA: Diagnosis present

## 2016-07-10 DIAGNOSIS — F329 Major depressive disorder, single episode, unspecified: Secondary | ICD-10-CM | POA: Diagnosis not present

## 2016-07-10 DIAGNOSIS — E876 Hypokalemia: Secondary | ICD-10-CM | POA: Diagnosis not present

## 2016-07-10 DIAGNOSIS — R945 Abnormal results of liver function studies: Secondary | ICD-10-CM | POA: Diagnosis not present

## 2016-07-10 DIAGNOSIS — R630 Anorexia: Secondary | ICD-10-CM | POA: Diagnosis not present

## 2016-07-10 DIAGNOSIS — N390 Urinary tract infection, site not specified: Secondary | ICD-10-CM | POA: Diagnosis not present

## 2016-07-10 DIAGNOSIS — S31809A Unspecified open wound of unspecified buttock, initial encounter: Secondary | ICD-10-CM | POA: Diagnosis not present

## 2016-07-10 DIAGNOSIS — T839XXD Unspecified complication of genitourinary prosthetic device, implant and graft, subsequent encounter: Secondary | ICD-10-CM | POA: Diagnosis not present

## 2016-07-10 DIAGNOSIS — E538 Deficiency of other specified B group vitamins: Secondary | ICD-10-CM | POA: Diagnosis present

## 2016-07-10 DIAGNOSIS — Z87891 Personal history of nicotine dependence: Secondary | ICD-10-CM | POA: Diagnosis not present

## 2016-07-10 DIAGNOSIS — A419 Sepsis, unspecified organism: Secondary | ICD-10-CM | POA: Diagnosis not present

## 2016-07-10 DIAGNOSIS — Z681 Body mass index (BMI) 19 or less, adult: Secondary | ICD-10-CM | POA: Diagnosis not present

## 2016-07-10 DIAGNOSIS — Z7401 Bed confinement status: Secondary | ICD-10-CM | POA: Diagnosis not present

## 2016-07-10 DIAGNOSIS — M50221 Other cervical disc displacement at C4-C5 level: Secondary | ICD-10-CM | POA: Diagnosis not present

## 2016-07-10 DIAGNOSIS — Z818 Family history of other mental and behavioral disorders: Secondary | ICD-10-CM | POA: Diagnosis not present

## 2016-07-10 DIAGNOSIS — K6812 Psoas muscle abscess: Secondary | ICD-10-CM | POA: Diagnosis not present

## 2016-07-10 DIAGNOSIS — T83098A Other mechanical complication of other indwelling urethral catheter, initial encounter: Secondary | ICD-10-CM | POA: Diagnosis not present

## 2016-07-10 DIAGNOSIS — E870 Hyperosmolality and hypernatremia: Secondary | ICD-10-CM | POA: Diagnosis not present

## 2016-07-10 DIAGNOSIS — M50223 Other cervical disc displacement at C6-C7 level: Secondary | ICD-10-CM | POA: Diagnosis not present

## 2016-07-10 DIAGNOSIS — T83511A Infection and inflammatory reaction due to indwelling urethral catheter, initial encounter: Secondary | ICD-10-CM | POA: Diagnosis present

## 2016-07-10 DIAGNOSIS — D509 Iron deficiency anemia, unspecified: Secondary | ICD-10-CM | POA: Diagnosis not present

## 2016-07-10 DIAGNOSIS — R7989 Other specified abnormal findings of blood chemistry: Secondary | ICD-10-CM | POA: Diagnosis present

## 2016-07-10 DIAGNOSIS — I959 Hypotension, unspecified: Secondary | ICD-10-CM | POA: Diagnosis present

## 2016-07-10 DIAGNOSIS — R1310 Dysphagia, unspecified: Secondary | ICD-10-CM | POA: Diagnosis present

## 2016-07-10 DIAGNOSIS — R6520 Severe sepsis without septic shock: Secondary | ICD-10-CM | POA: Diagnosis not present

## 2016-07-10 DIAGNOSIS — L89303 Pressure ulcer of unspecified buttock, stage 3: Secondary | ICD-10-CM | POA: Diagnosis not present

## 2016-07-10 LAB — PREGNANCY, URINE: Preg Test, Ur: NEGATIVE

## 2016-07-10 LAB — URINE CULTURE
Culture: >=100000 — AB
Special Requests: NORMAL

## 2016-07-10 LAB — BASIC METABOLIC PANEL
ANION GAP: 10 (ref 5–15)
BUN: 8 mg/dL (ref 6–20)
CO2: 27 mmol/L (ref 22–32)
Calcium: 9.4 mg/dL (ref 8.9–10.3)
Chloride: 104 mmol/L (ref 101–111)
Creatinine, Ser: 0.39 mg/dL — ABNORMAL LOW (ref 0.44–1.00)
GFR calc Af Amer: >60 mL/min (ref >60)
GFR calc non Af Amer: >60 mL/min (ref >60)
GLUCOSE: 89 mg/dL (ref 65–99)
POTASSIUM: 5.6 mmol/L — AB (ref 3.5–5.1)
Sodium: 141 mmol/L (ref 135–145)

## 2016-07-10 LAB — CBC
HEMATOCRIT: 35.9 % — AB (ref 36.0–46.0)
Hemoglobin: 10.4 g/dL — ABNORMAL LOW (ref 12.0–15.0)
MCH: 26 pg (ref 26.0–34.0)
MCHC: 29 g/dL — ABNORMAL LOW (ref 30.0–36.0)
MCV: 89.8 fL (ref 78.0–100.0)
PLATELETS: 349 10*3/uL (ref 150–400)
RBC: 4 MIL/uL (ref 3.87–5.11)
RDW: 15.2 % (ref 11.5–15.5)
WBC: 7.4 10*3/uL (ref 4.0–10.5)

## 2016-07-10 MED ORDER — CEPHALEXIN 500 MG PO CAPS
500.0000 mg | ORAL_CAPSULE | Freq: Four times a day (QID) | ORAL | Status: DC
  Administered 2016-07-10: 500 mg via ORAL
  Filled 2016-07-10: qty 1

## 2016-07-10 MED ORDER — COLLAGENASE 250 UNIT/GM EX OINT: TOPICAL_OINTMENT | Freq: Every day | CUTANEOUS | 0 refills | Status: DC

## 2016-07-10 MED ORDER — CEPHALEXIN 500 MG PO CAPS: 500.0000 mg | ORAL_CAPSULE | Freq: Four times a day (QID) | ORAL | 0 refills | Status: AC

## 2016-07-10 NOTE — Clinical Social Work Note (Addendum)
Pt is discharged and will return to [facility] today. CSW confirmed with Tammy at [facility] and clinicals sent to [facility]. CSW added report information to treatment team sticky note and RN aware. CSW arranged [facility] for transportation. CSW signing off as no further social work needs identified.   Oretha Ellis, [place], [place] Work  [phone]

## 2016-07-10 NOTE — NC FL2 (Signed)
[place] MEDICAID FL2 LEVEL OF CARE SCREENING TOOL     IDENTIFICATION  Patient Name: Deanna Baldwin Birthdate: 08/30/1975 Sex: female Admission Date (Current Location): 07/07/2016  [facility] and [place] Number:  Herbalist and Address:  [facility] [place]. [facility], [place] [address], [place], [place] [address]      Provider Number: [id number]  Attending Physician Name and Address:  Zenia Resides, MD  Relative Name and Phone Number:       Current Level of Care: Hospital Recommended Level of Care: [place] Prior Approval Number:    Date Approved/Denied:   PASRR Number: [id number]  Discharge Plan: SNF    Current Diagnoses: Patient Active Problem List   Diagnosis Date Noted  . Vitamin B 12 deficiency 07/09/2016  . Sepsis due to urinary tract infection ([place])   . Pressure ulcer, stage 4 ([place]) 07/08/2016  . Foley catheter problem ([place])   . Sepsis ([place]) 07/07/2016  . Unintentional weight loss 07/06/2016  . Relapsing remitting multiple sclerosis ([place]) 06/08/2016  . 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 Neurogenic bladder 04/09/2016  . Foley catheter in place 04/09/2016  . Tachycardia 04/08/2016  . Anemia of chronic disease 04/06/2016  . Dysphagia, oral phase 03/17/2016  . Neutropenia ([place])   . Muscle abscess   . Myofascitis   . Aspiration pneumonia of left lower lobe due to vomit ([place])   . Sacral decubitus ulcer, stage III ([place])   . Multiple sclerosis ([place])   . Protein-calorie malnutrition, severe ([place])   . Hypokalemia 03/05/2016  . Hypernatremia   . Pressure ulcer, stage 3 ([place]) 03/03/2016  . 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 Community acquired pneumonia 03/02/2016  . UTI (urinary tract infection) 03/02/2016  . Abnormal LFTs 03/02/2016    Orientation RESPIRATION BLADDER Height & Weight     Self, Time, Situation, Place  Normal Indwelling catheter Weight: 95 lb 7.4 oz (43.3 kg) Height:  5'  9" (175.3 cm)  BEHAVIORAL SYMPTOMS/MOOD NEUROLOGICAL BOWEL NUTRITION STATUS      Continent Diet (DIET DYS 3; honey thick fluids)  AMBULATORY STATUS COMMUNICATION OF NEEDS Skin   Extensive Assist Verbally PU Stage and Appropriate Care (unstagable-ischial tuberosity; right heel)     PU Stage 3 Dressing:  (left thigh) PU Stage 4 Dressing:  (upper & lower sacrum; left foot)               Personal Care Assistance Level of Assistance  Bathing, Feeding, Dressing Bathing Assistance: Maximum assistance Feeding assistance: Maximum assistance Dressing Assistance: Maximum assistance     Functional Limitations Info  Sight, Hearing, Speech Sight Info: Adequate Hearing Info: Adequate Speech Info: Adequate    SPECIAL CARE FACTORS FREQUENCY                       Contractures Contractures Info: Not present    Additional Factors Info  Isolation Precautions Code Status Info: Full Allergies Info: No known allergies     Isolation Precautions Info: Contact     Current Medications (07/10/2016):  This is the current hospital active medication list Current Facility-Administered Medications  Medication Dose Route Frequency Provider Last Rate Last Dose  . 0.9 %  sodium chloride infusion   Intravenous Continuous Rogue Bussing, MD 50 mL/hr at 07/08/16 [phone]    . acetaminophen (TYLENOL) tablet 650 mg  650 mg Oral Q6H PRN Rogue Bussing, MD   650 mg at 07/09/16 2325  . acetaminophen (  TYLENOL) tablet 650 mg  650 mg Oral Daily Olene Floss [place], MD   650 mg at 07/10/16 1631  . bisacodyl (DULCOLAX) EC tablet 5 mg  5 mg Oral Daily PRN Rogue Bussing, MD      . cephALEXin [facility]) capsule 500 mg  500 mg Oral Q6H Yoo, Elsia J, DO   500 mg at 07/10/16 1631  . Chlorhexidine Gluconate Cloth 2 % PADS 6 each  6 each Topical Q0600 Kinnie Feil, MD   6 each at 07/10/16 0600  . collagenase (SANTYL) ointment   Topical Daily Hensel, William A, MD      . enoxaparin  (LOVENOX) injection 30 mg  30 mg Subcutaneous Q24H Rogue Bussing, MD   30 mg at 07/10/16 1634  . feeding supplement (ENSURE ENLIVE) (ENSURE ENLIVE) liquid 237 mL  237 mL Oral BID BM Hensel, Jamal Collin, MD   237 mL at 07/09/16 1600  . feeding supplement (PRO-STAT SUGAR FREE 64) liquid 30 mL  30 mL Oral TID WC Rogue Bussing, MD   30 mL at 07/10/16 1627  . mirtazapine (REMERON) tablet 15 mg  15 mg Oral QHS Rogue Bussing, MD   15 mg at 07/09/16 2320  . multivitamin with minerals tablet 1 tablet  1 tablet Oral Daily Zenia Resides, MD   1 tablet at 07/10/16 1636  . mupirocin ointment (BACTROBAN) 2 % 1 application  1 application Nasal BID Kinnie Feil, MD   1 application at 44/96/75 2324  . polyethylene glycol (MIRALAX / GLYCOLAX) packet 17 g  17 g Oral Daily Rogue Bussing, MD   17 g at 07/10/16 1628  . protein supplement (RESOURCE BENEPROTEIN) powder 6 g  6 g Oral PRN Hensel, Jamal Collin, MD      . sodium chloride flush (NS) 0.9 % injection 3 mL  3 mL Intravenous Q12H Rogue Bussing, MD   3 mL at 07/09/16 2323     Discharge Medications: Please see discharge summary for a list of discharge medications.  Relevant Imaging Results:  Relevant Lab Results:   Additional Information SSN: [id number]  Truitt Merle, LCSW

## 2016-07-10 NOTE — Discharge Instructions (Signed)
It has been a pleasure taking care of you! You were admitted due to a UTI. We have treated you with antibiotics. With that your symptoms improved to the point we think it is safe to let you go home and follow up with your primary care doctor. We are discharging you on keflex 500mg  four times a day that you need to continue taking after you leave the hospital, your last dose will be on 07/20/16.

## 2016-07-10 NOTE — Progress Notes (Signed)
Received call from [place] at [facility]. Report provided. SW has notified [facility] for transport. Bartholomew Crews, RN

## 2016-07-10 NOTE — Progress Notes (Signed)
   Family Medicine Teaching Service Daily Progress Note Intern Pager: [phone]  Patient name: Deanna Baldwin record number: [id number] Date of birth: [birth year] Age: 41 y.o. Gender: female  Primary Care Provider: [name], DO Consultants: none Code Status: Full  Pt Overview and Major Events to Date:  6/6- admitted to [facility] for urosepsis  Assessment and Plan: Deanna Baldwin is a 41 y.o. female presenting with dislodged foley catheter and found to meet sepsis criteria. 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   Urosepsis: Patient with indwelling foley. Urine Cx >100K gram neg rods, Proteus. Blood culture NGTD. Continues to be tachycardic in mid-100s and soft BPs 90s/50s (though history of both per chart review). Tmax 102.52F overnight. - repeat NaCl bolus as needed, continue MIVF - Follow urine culture for sensitivities - Continue zosyn, transition to oral abx once sensitivities available - KUB to rule out upper tract disease or stones as pathogen is proteus - Bowel regimen - Tylenol prn for fever  Persistent tachycardia: Persistent, appears to be patient's baseline. Present during last hospitalization in March 2018. Thought to be 2/2 frailty and poor functional status.  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 - Turn patient q4h - Dysphagia 3 with honey thickened liquids  Anemia: Stable.  New BL ~ 10. - hold home ferrous sulfate until patient adequately treated for bacterial infection - monitor CBC  Severe Chronic Protein Malnutrition: Albumin 2.4.  - Continue home pro-stat  - Consulted Nutrition, appreciate recs  Heel wounds and recent sacral decubitus ulcer - Wound consulted, appreciate recs - dressing changes to ulcers as outlined by wound care  FEN/GI: Dysphagia 3  with honey thickened liquids Prophylaxis: Lovenox  Disposition: back to SNF ([facility]) once clinically improved  Subjective:  Doing well, denies any pain or concerns.   Objective: Temp:  [97.4 F (36.3 C)-102.2 F (39 C)] 97.7 F (36.5 C) (06/09 0624) Pulse Rate:  [103-140] 103 (06/09 0624) Resp:  [18-19] 18 (06/09 0624) BP: (77-89)/(43-53) 77/51 (06/09 0624) SpO2:  [98 %-100 %] 98 % (06/09 9735) Physical Exam: General: frail chronically ill appearing lady laying in bed in NAD Cardiovascular: tachycardic, regular rhythm. No MRG Respiratory: CTAB, no increased WOB Abdomen: soft, NTND, +BS Extremities: thin extremities with muscle wasting, LE chronically contracted Skin: multiple ulcers covered with dressing on legs and sacrum  Laboratory:  Recent Labs Lab 07/08/16 0258 07/09/16 0801 07/10/16 0642  WBC 7.9 7.3 7.4  HGB 7.7* 9.0* 10.4*  HCT 26.3* 30.7* 35.9*  PLT 463* 521* 349    Recent Labs Lab 07/07/16 1625 07/08/16 0258 07/09/16 0801  NA 139 139 141  K 3.5 3.2* 4.8  CL 101 105 108  CO2 27 24 24   BUN 7 <5* 5*  CREATININE 0.46 0.30* 0.31*  CALCIUM 8.8* 8.0* 9.1  PROT 6.5  --   --   BILITOT 0.4  --   --   ALKPHOS 203*  --   --   ALT 66*  --   --   AST 62*  --   --   GLUCOSE 154* 129* 82   LA 2.3 > 1.4  am cortisol 10.3  Imaging/Diagnostic Tests: No results found.  Steve Rattler, DO 07/10/2016, 7:29 AM PGY-1, [place] Intern pager: [phone], text pages welcome

## 2016-07-12 ENCOUNTER — Non-Acute Institutional Stay (SKILLED_NURSING_FACILITY): Payer: Medicare Other | Admitting: Internal Medicine

## 2016-07-12 ENCOUNTER — Encounter: Payer: Self-pay | Admitting: Internal Medicine

## 2016-07-12 DIAGNOSIS — G8220 Paraplegia, unspecified: Secondary | ICD-10-CM | POA: Diagnosis not present

## 2016-07-12 DIAGNOSIS — Z9289 Personal history of other medical treatment: Secondary | ICD-10-CM

## 2016-07-12 DIAGNOSIS — Z960 Presence of urogenital implants: Secondary | ICD-10-CM

## 2016-07-12 DIAGNOSIS — G35 Multiple sclerosis: Secondary | ICD-10-CM | POA: Diagnosis not present

## 2016-07-12 DIAGNOSIS — R945 Abnormal results of liver function studies: Secondary | ICD-10-CM

## 2016-07-12 DIAGNOSIS — L89303 Pressure ulcer of unspecified buttock, stage 3: Secondary | ICD-10-CM | POA: Diagnosis not present

## 2016-07-12 DIAGNOSIS — E43 Unspecified severe protein-calorie malnutrition: Secondary | ICD-10-CM

## 2016-07-12 DIAGNOSIS — N390 Urinary tract infection, site not specified: Secondary | ICD-10-CM

## 2016-07-12 DIAGNOSIS — R7989 Other specified abnormal findings of blood chemistry: Secondary | ICD-10-CM

## 2016-07-12 DIAGNOSIS — Z978 Presence of other specified devices: Secondary | ICD-10-CM

## 2016-07-12 LAB — CULTURE, BLOOD (ROUTINE X 2)
CULTURE: NO GROWTH
Culture: NO GROWTH
SPECIAL REQUESTS: ADEQUATE
Special Requests: ADEQUATE

## 2016-07-12 NOTE — Progress Notes (Signed)
Patient ID: Deanna Baldwin, female   DOB: 06/13/1975, 41 y.o.   MRN: [id number]    HISTORY AND PHYSICAL   DATE: 07/12/2016  Location:    [facility] Room Number: 681 A Place of Service: SNF (31)   Extended Emergency Contact Information Primary Emergency Contact: Dalbert Mayotte,  [address] [address] [place] Phone: [phone] Mobile Phone: [phone] Relation: Aunt Secondary Emergency Contact: Smith,Crystal  [place] Mobile Phone: [phone] Relation: Sister  Advanced Directive information Does Patient Have a Medical Advance Directive?: Yes, Type of Advance Directive: Out of facility DNR (pink MOST or yellow form), Pre-existing out of facility DNR order (yellow form or pink MOST form): Pink MOST form placed in chart (order not valid for inpatient use) (Attempt CPR), Does patient want to make changes to medical advance directive?: No - Patient declined  Chief Complaint  Patient presents with  . Readmit To SNF    Readmission    HPI:  41 yo female long term resident seen today as a readmission into SNF following hospital stay for  Sepsis 2/2 Proteus UTI, (+) MRSA screening, MS with spastic paraplegia, chronic foley cath 2/2 neurogenic bladder, stage 4/stage 3 pressure ulcer, severe protein calorie malnutrition, autonomic dysfunction. She presented to ED with sepsis. Urine cx (+) Proteus mirablis, >100K colonies. Lactic acid 2.47. Blood cx neg. CXR neg for acute process. Abdominal xray showed increased stool but no obstruction. She was tx with IV vanco and zosyn -->po keflex. Albumin 2.4; Cr 0.39; K peaked 5.6; AST 62; ALT 66; alk phos 203; Hgb 10.4-->7.7-->10.4; WBC 10.8K-->7.4K; abs neutrophils 9.5K; plts 661K-->349K at d/c. She presents to SNF for long term care.  Today she reports no concerns. No f/c. Appetite ok and sleeps well. No nursing concerns. No hematuria. She has a chronic foley cath due to neurogenic bladder. She  is c/a 30 yo son who is moving to [facility] in 3 weeks to stay with father.  MS -  followed by neurology. She has b/l LE splints in place. She is allergic to Tysabri; she gets rituximab injection 1000 units every 6 months through neurology; she also gets botox injections and is awaiting an MRI of brain.    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 She is not on medications and uses bilateral splints  Sacral decubitus stage III - she gets treatment per facility protocol; followed by facility wound care provider  Neurogenic bladder - 2/2 MS; has long term foley cath  Severe Protein calorie malnutrition - albumin 2.4. She gets nutritional supplements per facility protocol; prostat 30 cc three times daily   Constipation - chronic. She takes miralax daily   Dysphagia - stable. No signs of aspiration  Hx Anemia - 2/2 chronic disease and iron deficiency. She has req'd PRBC transfusions in the past. Hgb 10.4   Tachycardia with low blood pressure - has not tolerated BB in past. Followed by cardiology    Past Medical History:  Diagnosis Date  . Buttock wound 03/03/2016  . MS (multiple sclerosis) ([place])   . Pneumonia 02/2016  . Protein calorie malnutrition ([place])   . Severe sepsis ([place]) 03/03/2016  . UTI (urinary tract infection) 02/2016    Past Surgical History:  Procedure Laterality Date  . NO PAST SURGERIES      Patient Care Team: [name], DO as PCP - General (Internal Medicine)  Social History   Social History  . Marital status: Single  Spouse name: N/A  . Number of children: N/A  . Years of education: N/A   Occupational History  . Disabled    Social History Main Topics  . Smoking status: Former Smoker    Packs/day: 1.00    Types: Cigarettes    Quit date: 02/01/2010  . Smokeless tobacco: Never Used  . Alcohol use No  . Drug use: No  . Sexual activity: Not on file   Other Topics Concern  . Not on file   Social History Narrative   She is a resident at [facility].   Right-handed.     reports that she quit smoking about 6 years ago. Her smoking use included Cigarettes. She smoked 1.00 pack per day. She has never used smokeless tobacco. She reports that she does not drink alcohol or use drugs.  Family History  Problem Relation Age of Onset  . Mental illness Sister    Family Status  Relation Status  . Mother Deceased  . Father Deceased  . Sister Alive  . MGM Deceased  . MGF Deceased  . PGM Deceased  . PGF Deceased  . Sister Alive    Immunization History  Administered Date(s) Administered  . Influenza,inj,Quad PF,36+ Mos 03/06/2016  . PPD Test 03/31/2016    No Known Allergies  Medications: Patient's Medications  New Prescriptions   No medications on file  Previous Medications   ACETAMINOPHEN (TYLENOL) 325 MG TABLET    Take 650 mg by mouth every morning.    ACETAMINOPHEN (TYLENOL) 325 MG TABLET    Take 650 mg by mouth every 6 (six) hours as needed for mild pain.   AMINO ACIDS-PROTEIN HYDROLYS (FEEDING SUPPLEMENT, PRO-STAT SUGAR FREE 64,) LIQD    Take 30 mLs by mouth 3 (three) times daily with meals.   BISACODYL (DULCOLAX) 5 MG EC TABLET    Take 1 tablet (5 mg total) by mouth daily as needed for moderate constipation.   CEPHALEXIN (KEFLEX) 500 MG CAPSULE    Take 1 capsule (500 mg total) by mouth every 6 (six) hours.   COLLAGENASE (SANTYL) OINTMENT    Apply topically daily.   CYANOCOBALAMIN 1000 MCG TABLET    Take 1,000 mcg by mouth daily.   FERROUS SULFATE 325 (65 FE) MG TABLET    Take 1 tablet (325 mg total) by mouth 2 (two) times daily with a meal.   MIRTAZAPINE (REMERON) 15 MG TABLET    Take 0.5 mg by mouth at bedtime.    MULTIPLE VITAMINS-MINERALS (DECUBI-VITE) CAPS    Take 1 capsule by mouth daily.   NUTRITIONAL SUPPLEMENTS (NUTRITIONAL SUPPLEMENT PO)    Take 120 mLs by mouth 3 (three) times daily as needed. House Supplement - Med Pass   POLYETHYLENE GLYCOL (MIRALAX / GLYCOLAX) PACKET    Take 17 g by mouth daily.   VITAMIN C  (ASCORBIC ACID) 500 MG TABLET    Take 500 mg by mouth 2 (two) times daily.  Modified Medications   No medications on file  Discontinued Medications   No medications on file    Review of Systems  Musculoskeletal: Positive for gait problem.  Skin: Positive for wound.  Neurological: Positive for weakness.  All other systems reviewed and are negative.   Vitals:   07/12/16 1059  BP: 98/60  Pulse: (!) 115  Resp: 16  Temp: 97 F (36.1 C)  TempSrc: Oral  SpO2: 92%  Weight: 82 lb 6.4 oz (37.4 kg)  Height: '5\' 9"'  (1.753 m)   Body  mass index is 12.17 kg/m.  Physical Exam  Constitutional: She is oriented to person, place, and time. She appears cachectic. No distress.  Frail appearing in NAD, sitting up in bed  HENT:  Mouth/Throat: Oropharynx is clear and moist. No oropharyngeal exudate.  Eyes: Pupils are equal, round, and reactive to light. No scleral icterus.  Neck: Neck supple. Carotid bruit is not present. No tracheal deviation present. No thyromegaly present.  Cardiovascular: Regular rhythm, normal heart sounds and intact distal pulses.  Tachycardia present.  Exam reveals no gallop and no friction rub.   No murmur heard. No LE edema b/l. no calf TTP.   Pulmonary/Chest: Effort normal. No stridor. No respiratory distress. She has no wheezes. She has no rales.  Abdominal: Soft. Bowel sounds are normal. She exhibits no distension and no mass. There is no hepatomegaly. There is no tenderness. There is no rebound and no guarding.  Genitourinary:  Genitourinary Comments: Foley cath intact and DTG clear yellow urine  Musculoskeletal: She exhibits edema. She exhibits no tenderness.  Contracture b/l LE  Lymphadenopathy:    She has no cervical adenopathy.  Neurological: She is alert and oriented to person, place, and time. She displays atrophy. She exhibits abnormal muscle tone.  Spastic quadriplegia  Skin: Skin is warm and dry. No rash noted.  B/l foot unna boot intact; dsg c/d/i    Psychiatric: She has a normal mood and affect. Her behavior is normal. Judgment and thought content normal.     Labs reviewed: Nursing Home on 07/12/2016  Component Date Value Ref Range Status  . Hemoglobin A1C 06/24/2016 5.5   Final  Admission on 07/07/2016, Discharged on 07/10/2016  Component Date Value Ref Range Status  . Sodium 07/07/2016 139  135 - 145 mmol/L Final  . Potassium 07/07/2016 3.5  3.5 - 5.1 mmol/L Final  . Chloride 07/07/2016 101  101 - 111 mmol/L Final  . CO2 07/07/2016 27  22 - 32 mmol/L Final  . Glucose, Bld 07/07/2016 154* 65 - 99 mg/dL Final  . BUN 07/07/2016 7  6 - 20 mg/dL Final  . Creatinine, Ser 07/07/2016 0.46  0.44 - 1.00 mg/dL Final  . Calcium 07/07/2016 8.8* 8.9 - 10.3 mg/dL Final  . Total Protein 07/07/2016 6.5  6.5 - 8.1 g/dL Final  . Albumin 07/07/2016 2.4* 3.5 - 5.0 g/dL Final  . AST 07/07/2016 62* 15 - 41 U/L Final  . ALT 07/07/2016 66* 14 - 54 U/L Final  . Alkaline Phosphatase 07/07/2016 203* 38 - 126 U/L Final  . Total Bilirubin 07/07/2016 0.4  0.3 - 1.2 mg/dL Final  . GFR calc non Af Amer 07/07/2016 >60  >60 mL/min Final  . GFR calc Af Amer 07/07/2016 >60  >60 mL/min Final   Comment: (NOTE) The eGFR has been calculated using the CKD EPI equation. This calculation has not been validated in all clinical situations. eGFR's persistently <60 mL/min signify possible Chronic Kidney Disease.   . Anion gap 07/07/2016 11  5 - 15 Final  . WBC 07/07/2016 10.8* 4.0 - 10.5 K/uL Final  . RBC 07/07/2016 3.95  3.87 - 5.11 MIL/uL Final  . Hemoglobin 07/07/2016 10.4* 12.0 - 15.0 g/dL Final  . HCT 07/07/2016 34.9* 36.0 - 46.0 % Final  . MCV 07/07/2016 88.4  78.0 - 100.0 fL Final  . MCH 07/07/2016 26.3  26.0 - 34.0 pg Final  . MCHC 07/07/2016 29.8* 30.0 - 36.0 g/dL Final  . RDW 07/07/2016 14.9  11.5 - 15.5 % Final  .  Platelets 07/07/2016 661* 150 - 400 K/uL Final  . Neutrophils Relative % 07/07/2016 89  % Final  . Neutro Abs 07/07/2016 9.5* 1.7 - 7.7  K/uL Final  . Lymphocytes Relative 07/07/2016 8  % Final  . Lymphs Abs 07/07/2016 0.8  0.7 - 4.0 K/uL Final  . Monocytes Relative 07/07/2016 3  % Final  . Monocytes Absolute 07/07/2016 0.4  0.1 - 1.0 K/uL Final  . Eosinophils Relative 07/07/2016 0  % Final  . Eosinophils Absolute 07/07/2016 0.0  0.0 - 0.7 K/uL Final  . Basophils Relative 07/07/2016 0  % Final  . Basophils Absolute 07/07/2016 0.0  0.0 - 0.1 K/uL Final  . Lactic Acid, Venous 07/07/2016 2.47* 0.5 - 1.9 mmol/L Final  . Comment 07/07/2016 NOTIFIED PHYSICIAN   Final  . Specimen Description 07/07/2016 BLOOD RIGHT ANTECUBITAL   Final  . Special Requests 07/07/2016 BOTTLES DRAWN AEROBIC AND ANAEROBIC Blood Culture adequate volume   Final  . Culture 07/07/2016 NO GROWTH 4 DAYS   Final  . Report Status 07/07/2016 PENDING   Incomplete  . Specimen Description 07/07/2016 BLOOD RIGHT ARM   Final  . Special Requests 07/07/2016 BOTTLES DRAWN AEROBIC AND ANAEROBIC Blood Culture adequate volume   Final  . Culture 07/07/2016 NO GROWTH 4 DAYS   Final  . Report Status 07/07/2016 PENDING   Incomplete  . Color, Urine 07/07/2016 YELLOW  YELLOW Final  . APPearance 07/07/2016 TURBID* CLEAR Final  . Specific Gravity, Urine 07/07/2016 1.020  1.005 - 1.030 Final  . pH 07/07/2016 8.5* 5.0 - 8.0 Final  . Glucose, UA 07/07/2016 NEGATIVE  NEGATIVE mg/dL Final  . Hgb urine dipstick 07/07/2016 SMALL* NEGATIVE Final  . Bilirubin Urine 07/07/2016 NEGATIVE  NEGATIVE Final  . Ketones, ur 07/07/2016 NEGATIVE  NEGATIVE mg/dL Final  . Protein, ur 07/07/2016 100* NEGATIVE mg/dL Final  . Nitrite 07/07/2016 POSITIVE* NEGATIVE Final  . Leukocytes, UA 07/07/2016 MODERATE* NEGATIVE Final  . Specimen Description 07/07/2016 URINE, CATHETERIZED   Final  . Special Requests 07/07/2016 Normal   Final  . Culture 07/07/2016 >=100,000 COLONIES/mL PROTEUS MIRABILIS*  Final  . Report Status 07/07/2016 07/10/2016 FINAL   Final  . Organism ID, Bacteria 07/07/2016 PROTEUS  MIRABILIS*  Final  . RBC / HPF 07/07/2016 0-5  0 - 5 RBC/hpf Final  . WBC, UA 07/07/2016 TOO NUMEROUS TO COUNT  0 - 5 WBC/hpf Final  . Bacteria, UA 07/07/2016 MANY* NONE SEEN Final  . Squamous Epithelial / LPF 07/07/2016 0-5* NONE SEEN Final  . Urine-Other 07/07/2016 LESS THAN 10 mL OF URINE SUBMITTED   Final   MICROSCOPIC EXAM PERFORMED ON UNCONCENTRATED URINE  . Sodium 07/08/2016 139  135 - 145 mmol/L Final  . Potassium 07/08/2016 3.2* 3.5 - 5.1 mmol/L Final  . Chloride 07/08/2016 105  101 - 111 mmol/L Final  . CO2 07/08/2016 24  22 - 32 mmol/L Final  . Glucose, Bld 07/08/2016 129* 65 - 99 mg/dL Final  . BUN 07/08/2016 <5* 6 - 20 mg/dL Final  . Creatinine, Ser 07/08/2016 0.30* 0.44 - 1.00 mg/dL Final  . Calcium 07/08/2016 8.0* 8.9 - 10.3 mg/dL Final  . GFR calc non Af Amer 07/08/2016 >60  >60 mL/min Final  . GFR calc Af Amer 07/08/2016 >60  >60 mL/min Final   Comment: (NOTE) The eGFR has been calculated using the CKD EPI equation. This calculation has not been validated in all clinical situations. eGFR's persistently <60 mL/min signify possible Chronic Kidney Disease.   . Anion gap 07/08/2016 10  5 - 15 Final  . WBC 07/08/2016 7.9  4.0 - 10.5 K/uL Final  . RBC 07/08/2016 2.97* 3.87 - 5.11 MIL/uL Final  . Hemoglobin 07/08/2016 7.7* 12.0 - 15.0 g/dL Final   Comment: DELTA CHECK NOTED REPEATED TO VERIFY   . HCT 07/08/2016 26.3* 36.0 - 46.0 % Final  . MCV 07/08/2016 88.6  78.0 - 100.0 fL Final  . MCH 07/08/2016 25.9* 26.0 - 34.0 pg Final  . MCHC 07/08/2016 29.3* 30.0 - 36.0 g/dL Final  . RDW 07/08/2016 15.2  11.5 - 15.5 % Final  . Platelets 07/08/2016 463* 150 - 400 K/uL Final  . Lactic Acid, Venous 07/07/2016 2.3* 0.5 - 1.9 mmol/L Final   Comment: CRITICAL RESULT CALLED TO, READ BACK BY AND VERIFIED WITH: M.WOOD,RN 0050 07/08/16 M.CAMPBELL   . Lactic Acid, Venous 07/08/2016 1.4  0.5 - 1.9 mmol/L Final  . MRSA by PCR 07/07/2016 POSITIVE* NEGATIVE Final   Comment:        The  GeneXpert MRSA Assay (FDA approved for NASAL specimens only), is one component of a comprehensive MRSA colonization surveillance program. It is not intended to diagnose MRSA infection nor to guide or monitor treatment for MRSA infections. RESULT CALLED TO, READ BACK BY AND VERIFIED WITH: Rob Bunting RN AT 0622 07/08/16 BY A.DAVIS   . Cortisol, Plasma 07/08/2016 10.3  ug/dL Final   Comment: (NOTE) AM    6.7 - 22.6 ug/dL PM   <10.0       ug/dL   . WBC 07/09/2016 7.3  4.0 - 10.5 K/uL Final  . RBC 07/09/2016 3.43* 3.87 - 5.11 MIL/uL Final  . Hemoglobin 07/09/2016 9.0* 12.0 - 15.0 g/dL Final  . HCT 07/09/2016 30.7* 36.0 - 46.0 % Final  . MCV 07/09/2016 89.5  78.0 - 100.0 fL Final  . MCH 07/09/2016 26.2  26.0 - 34.0 pg Final  . MCHC 07/09/2016 29.3* 30.0 - 36.0 g/dL Final  . RDW 07/09/2016 15.1  11.5 - 15.5 % Final  . Platelets 07/09/2016 521* 150 - 400 K/uL Final  . Sodium 07/09/2016 141  135 - 145 mmol/L Final  . Potassium 07/09/2016 4.8  3.5 - 5.1 mmol/L Final  . Chloride 07/09/2016 108  101 - 111 mmol/L Final  . CO2 07/09/2016 24  22 - 32 mmol/L Final  . Glucose, Bld 07/09/2016 82  65 - 99 mg/dL Final  . BUN 07/09/2016 5* 6 - 20 mg/dL Final  . Creatinine, Ser 07/09/2016 0.31* 0.44 - 1.00 mg/dL Final  . Calcium 07/09/2016 9.1  8.9 - 10.3 mg/dL Final  . GFR calc non Af Amer 07/09/2016 >60  >60 mL/min Final  . GFR calc Af Amer 07/09/2016 >60  >60 mL/min Final   Comment: (NOTE) The eGFR has been calculated using the CKD EPI equation. This calculation has not been validated in all clinical situations. eGFR's persistently <60 mL/min signify possible Chronic Kidney Disease.   . Anion gap 07/09/2016 9  5 - 15 Final  . WBC 07/10/2016 7.4  4.0 - 10.5 K/uL Final  . RBC 07/10/2016 4.00  3.87 - 5.11 MIL/uL Final  . Hemoglobin 07/10/2016 10.4* 12.0 - 15.0 g/dL Final  . HCT 07/10/2016 35.9* 36.0 - 46.0 % Final  . MCV 07/10/2016 89.8  78.0 - 100.0 fL Final  . MCH 07/10/2016 26.0  26.0 - 34.0  pg Final  . MCHC 07/10/2016 29.0* 30.0 - 36.0 g/dL Final  . RDW 07/10/2016 15.2  11.5 - 15.5 % Final  . Platelets 07/10/2016  349  150 - 400 K/uL Final  . Sodium 07/10/2016 141  135 - 145 mmol/L Final  . Potassium 07/10/2016 5.6* 3.5 - 5.1 mmol/L Final   SLIGHT HEMOLYSIS  . Chloride 07/10/2016 104  101 - 111 mmol/L Final  . CO2 07/10/2016 27  22 - 32 mmol/L Final  . Glucose, Bld 07/10/2016 89  65 - 99 mg/dL Final  . BUN 07/10/2016 8  6 - 20 mg/dL Final  . Creatinine, Ser 07/10/2016 0.39* 0.44 - 1.00 mg/dL Final  . Calcium 07/10/2016 9.4  8.9 - 10.3 mg/dL Final  . GFR calc non Af Amer 07/10/2016 >60  >60 mL/min Final  . GFR calc Af Amer 07/10/2016 >60  >60 mL/min Final   Comment: (NOTE) The eGFR has been calculated using the CKD EPI equation. This calculation has not been validated in all clinical situations. eGFR's persistently <60 mL/min signify possible Chronic Kidney Disease.   . Anion gap 07/10/2016 10  5 - 15 Final  . Preg Test, Ur 07/10/2016 NEGATIVE  NEGATIVE Final   Comment:        THE SENSITIVITY OF THIS METHODOLOGY IS >20 mIU/mL.   Nursing Home on 06/23/2016  Component Date Value Ref Range Status  . Hemoglobin 06/23/2016 9.4* 12.0 - 16.0 g/dL Final  . HCT 06/23/2016 31* 36 - 46 % Final  . Neutrophils Absolute 06/23/2016 10  /L Final  . Platelets 06/23/2016 629* 150 - 399 K/L Final  . WBC 06/23/2016 12.1  10^3/mL Final  . Glucose 06/23/2016 150  mg/dL Final  . BUN 06/23/2016 12  4 - 21 mg/dL Final  . Creatinine 06/23/2016 0.2* 0.5 - 1.1 mg/dL Final  . Potassium 06/23/2016 3.9  3.4 - 5.3 mmol/L Final  . Sodium 06/23/2016 139  137 - 147 mmol/L Final  . Alkaline Phosphatase 06/23/2016 219* 25 - 125 U/L Final  . ALT 06/23/2016 63* 7 - 35 U/L Final  . AST 06/23/2016 44* 13 - 35 U/L Final  . Bilirubin, Total 06/23/2016 0.2  mg/dL Final  . Vitamin B-12 06/23/2016 287   Final  . TSH 06/23/2016 2.28  0.41 - 5.90 uIU/mL Final  Office Visit on 06/08/2016  Component Date  Value Ref Range Status  . WBC 06/24/2016 12.0* 3.4 - 10.8 x10E3/uL Final  . RBC 06/24/2016 3.69* 3.77 - 5.28 x10E6/uL Final  . Hemoglobin 06/24/2016 9.7* 11.1 - 15.9 g/dL Final  . Hematocrit 06/24/2016 30.9* 34.0 - 46.6 % Final  . MCV 06/24/2016 84  79 - 97 fL Final  . MCH 06/24/2016 26.3* 26.6 - 33.0 pg Final  . MCHC 06/24/2016 31.4* 31.5 - 35.7 g/dL Final  . RDW 06/24/2016 14.8  12.3 - 15.4 % Final  . Neutrophils 06/24/2016 84  Not Estab. % Final  . Lymphs 06/24/2016 10  Not Estab. % Final  . Monocytes 06/24/2016 5  Not Estab. % Final  . Eos 06/24/2016 1  Not Estab. % Final  . Basos 06/24/2016 0  Not Estab. % Final  . Neutrophils Absolute 06/24/2016 9.9* 1.4 - 7.0 x10E3/uL Final  . Lymphocytes Absolute 06/24/2016 1.2  0.7 - 3.1 x10E3/uL Final  . Monocytes Absolute 06/24/2016 0.6  0.1 - 0.9 x10E3/uL Final  . EOS (ABSOLUTE) 06/24/2016 0.2  0.0 - 0.4 x10E3/uL Final  . Basophils Absolute 06/24/2016 0.0  0.0 - 0.2 x10E3/uL Final  . Immature Granulocytes 06/24/2016 0  Not Estab. % Final  . Immature Grans (Abs) 06/24/2016 0.0  0.0 - 0.1 x10E3/uL Final  . TSH 06/24/2016  4.160  0.450 - 4.500 uIU/mL Final  . QUANTIFERON INCUBATION 06/24/2016 Comment   Final   Specimen incubated at [facility], [place], [place].  Marland Kitchen Hepatitis B Surface Ag 06/24/2016 Negative  Negative Final  . Hep B Surface Ab, Qual 06/24/2016 Non Reactive   Final   Comment:               Non Reactive: Inconsistent with immunity,                             less than 10 mIU/mL               Reactive:     Consistent with immunity,                             greater than 9.9 mIU/mL   . Hep B Core Total Ab 06/24/2016 Negative  Negative Final  . Varicella zoster IgG 06/24/2016 2342  Immune >165 index Final   Comment:                                Negative          <135                                Equivocal    135 - 165                                Positive          >165 A positive result generally indicates exposure to  the pathogen or administration of specific immunoglobulins, but it is not indication of active infection or stage of disease.   Marland Kitchen Hep C Virus Ab 06/24/2016 <0.1  0.0 - 0.9 s/co ratio Final   Comment:                                   Negative:     < 0.8                              Indeterminate: 0.8 - 0.9                                   Positive:     > 0.9  The CDC recommends that a positive HCV antibody result  be followed up with a HCV Nucleic Acid Amplification  test (027253).   . QUANTIFERON TB GOLD 06/24/2016 Negative  Negative Final  . QUANTIFERON CRITERIA 06/24/2016 Comment   Final   Comment: To be considered positive a specimen should have a TB Ag minus Nil value greater than or equal to 0.35 IU/mL and in addition the TB Ag minus Nil value must be greater than or equal to 25% of the Nil value. There may be insufficient information in these values to differentiate between some negative and some indeterminate test values.   . QUANTIFERON TB AG VALUE 06/24/2016 0.07  IU/mL Final  . Quantiferon Nil Value 06/24/2016 0.07  IU/mL Final  . QUANTIFERON MITOGEN VALUE  06/24/2016 4.91  IU/mL Final  . QFT TB AG MINUS NIL VALUE 06/24/2016 0.00  IU/mL Final  . Interpretation: 06/24/2016 Comment   Final   Comment: The QuantiFERON TB Gold (in Tube) assay is intended for use as an aid in the diagnosis of TB infection. Negative results suggest that there is no TB infection. In patients with high suspicion of exposure, a negative test should be repeated. A positive test indicates infection with Mycobacterium tuberculosis. Among individuals without tuberculosis infection, a positive test may be due to exposure to [place], M. szulgai or M. marinum. On the Internet, go to https://figueroa-lambert.info/ for further details.   Nursing Home on 05/11/2016  Component Date Value Ref Range Status  . Hemoglobin 03/19/2016 10.7* 12.0 - 16.0 g/dL Final  . HCT 03/19/2016 36  36 - 46 % Final  . Neutrophils Absolute  03/19/2016 10  /L Final  . Platelets 03/19/2016 513* 150 - 399 K/L Final  . WBC 03/19/2016 12.3  10^3/mL Final  . Glucose 03/19/2016 112  mg/dL Final  . BUN 03/19/2016 14  4 - 21 mg/dL Final  . Creatinine 03/19/2016 0.4* 0.5 - 1.1 mg/dL Final  . Potassium 03/19/2016 4.7  3.4 - 5.3 mmol/L Final  . Sodium 03/19/2016 149* 137 - 147 mmol/L Final  . Alkaline Phosphatase 03/19/2016 113  25 - 125 U/L Final  . ALT 03/19/2016 64* 7 - 35 U/L Final  . AST 03/19/2016 74* 13 - 35 U/L Final  . Bilirubin, Total 03/19/2016 0.3  mg/dL Final  . Hemoglobin 05/12/2016 10.3* 12.0 - 16.0 g/dL Final  . HCT 05/12/2016 33* 36 - 46 % Final  . Neutrophils Absolute 05/12/2016 8  /L Final  . WBC 05/12/2016 9.8  10^3/mL Final  . Glucose 05/12/2016 92  mg/dL Final  . BUN 05/12/2016 11  4 - 21 mg/dL Final  . Creatinine 05/12/2016 0.5  0.5 - 1.1 mg/dL Final  . Potassium 05/12/2016 4.5  3.4 - 5.3 mmol/L Final  . Sodium 05/12/2016 140  137 - 147 mmol/L Final  . Alkaline Phosphatase 05/12/2016 81  25 - 125 U/L Final  . ALT 05/12/2016 15  7 - 35 U/L Final  . AST 05/12/2016 17  13 - 35 U/L Final  . Bilirubin, Total 05/12/2016 0.5  mg/dL Final    Dg Abd 1 View  Result Date: 07/10/2016 CLINICAL DATA:  Urinary tract infection EXAM: ABDOMEN - 1 VIEW COMPARISON:  CT abdomen and pelvis Jun 25, 2008 FINDINGS: There is diffuse stool throughout the colon. There is a paucity of small bowel gas. There is no bowel dilatation or air-fluid level to suggest bowel obstruction. No free air. There are scattered probable vascular calcifications in the abdomen. Lung bases are clear. IMPRESSION: Diffuse stool throughout colon. Paucity of small bowel gas may be seen normally but also may be seen with early ileus or enteritis. Lung bases clear. No evident free air. Electronically Signed   By: Lowella Grip III M.D.   On: 07/10/2016 12:05   Dg Chest Port 1 View  Result Date: 07/07/2016 CLINICAL DATA:  41 year old with current history of  multiple sclerosis, presenting with acute onset of fever. Personal history of pneumonia earlier this year. EXAM: PORTABLE CHEST 1 VIEW COMPARISON:  CT chest 04/07/2016. Chest x-rays 04/06/2016, 03/07/2016 and earlier. FINDINGS: Patient is slightly rotated to the left. Cardiomediastinal silhouette unremarkable, unchanged. Lungs clear. Bronchovascular markings normal. Pulmonary vascularity normal. No visible pleural effusions. No pneumothorax. IMPRESSION: No acute cardiopulmonary disease. Electronically Signed  By: Evangeline Dakin M.D.   On: 07/07/2016 15:51     Assessment/Plan   ICD-10-CM   1. Urinary tract infection without hematuria, site unspecified N39.0    improving  2. Protein-calorie malnutrition, severe ([place]) E43   3. Relapsing remitting multiple sclerosis ([place]) G35   4. 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 Decubitus ulcer of buttock, stage 3, unspecified laterality ([place]) L89.303   6. Foley catheter in place Z92.89   7. Abnormal LFTs R94.5     Cont current meds as ordered  F/u with [facility] for rituxan infusion and botox injection as she missed last appt while admitted to the hospital - order placed in chart  Foley cath care as indicated  Wound care as ordered  Cont nutritional supplements as ordered  GOAL: short term rehab then long term care. Communicated with pt and nursing.  Will follow  Solara Goodchild S. Perlie Gold  [facility] [address] [place], [place] [address] [phone] Cell (Monday-Friday 8 AM - 5 PM) [phone] After 5 PM and follow prompts

## 2016-07-13 DIAGNOSIS — L89144 Pressure ulcer of left lower back, stage 4: Secondary | ICD-10-CM | POA: Diagnosis not present

## 2016-07-13 DIAGNOSIS — L89894 Pressure ulcer of other site, stage 4: Secondary | ICD-10-CM | POA: Diagnosis not present

## 2016-07-13 DIAGNOSIS — L89890 Pressure ulcer of other site, unstageable: Secondary | ICD-10-CM | POA: Diagnosis not present

## 2016-07-14 DIAGNOSIS — F4320 Adjustment disorder, unspecified: Secondary | ICD-10-CM | POA: Diagnosis not present

## 2016-07-14 DIAGNOSIS — R630 Anorexia: Secondary | ICD-10-CM | POA: Diagnosis not present

## 2016-07-14 DIAGNOSIS — F329 Major depressive disorder, single episode, unspecified: Secondary | ICD-10-CM | POA: Diagnosis not present

## 2016-07-15 ENCOUNTER — Ambulatory Visit
Admission: RE | Admit: 2016-07-15 | Discharge: 2016-07-15 | Disposition: A | Discharge status: Home / Self Care | Payer: Medicare Other | Source: Ambulatory Visit | Attending: Neurology | Admitting: Neurology

## 2016-07-15 ENCOUNTER — Other Ambulatory Visit: Payer: Self-pay | Admitting: Neurology

## 2016-07-15 DIAGNOSIS — G35 Multiple sclerosis: Secondary | ICD-10-CM

## 2016-07-15 DIAGNOSIS — G114 Hereditary spastic paraplegia: Secondary | ICD-10-CM | POA: Diagnosis not present

## 2016-07-15 DIAGNOSIS — M50223 Other cervical disc displacement at C6-C7 level: Secondary | ICD-10-CM | POA: Diagnosis not present

## 2016-07-15 DIAGNOSIS — M50222 Other cervical disc displacement at C5-C6 level: Secondary | ICD-10-CM | POA: Diagnosis not present

## 2016-07-15 DIAGNOSIS — G8220 Paraplegia, unspecified: Secondary | ICD-10-CM

## 2016-07-15 DIAGNOSIS — M50221 Other cervical disc displacement at C4-C5 level: Secondary | ICD-10-CM | POA: Diagnosis not present

## 2016-07-16 ENCOUNTER — Telehealth: Payer: Self-pay | Admitting: Neurology

## 2016-07-16 NOTE — Telephone Encounter (Signed)
Please call patient, MRI of the brain showed multiple sclerosis lesions, no contrast enhancement, MRI of the cervical spine showed multiple T2 hypointensity lesions within the spinal cord  I will review films with her at her next follow-up visit  IMPRESSION:  This MRI of the brain with and without contrast shows the following: 1.   Multiple infratentorial and hemispheric T2/FLAIR hyperintense foci consistent with chronic demyelinating plaque associated with multiple sclerosis. None of the foci appeared to be acute. 2.   Moderate cortical atrophy and corpus callosum atrophy with mild brainstem atrophy. 3.   There are no acute findings.   IMPRESSION:  This MRI of the cervical spine without contrast shows the following: 1.   Multiple T2 hyperintense foci within the spinal cord as detailed above.   IV access was not obtainable for contrast. 2.   Mild disc bulges at C3-C4, C4-C5 and C5-C6 but do not lead to any nerve root compression or central canal narrowing.

## 2016-07-19 LAB — HEPATIC FUNCTION PANEL
ALK PHOS: 152 — AB (ref 25–125)
ALT: 46 — AB (ref 7–35)
AST: 34 (ref 13–35)
Bilirubin, Total: 0.2

## 2016-07-19 LAB — BASIC METABOLIC PANEL
BUN: 8 (ref 4–21)
CREATININE: 0.2 — AB (ref 0.5–1.1)
GLUCOSE: 140
Potassium: 3.8 (ref 3.4–5.3)
SODIUM: 138 (ref 137–147)

## 2016-07-19 NOTE — Telephone Encounter (Signed)
Called [facility] and spoke to Seth Bake [phone]).  She is aware of MRI results and they have been faxed over for patient's file [phone]).

## 2016-07-20 ENCOUNTER — Encounter: Payer: Self-pay | Admitting: *Deleted

## 2016-07-20 DIAGNOSIS — L89144 Pressure ulcer of left lower back, stage 4: Secondary | ICD-10-CM | POA: Diagnosis not present

## 2016-07-20 DIAGNOSIS — L89894 Pressure ulcer of other site, stage 4: Secondary | ICD-10-CM | POA: Diagnosis not present

## 2016-07-20 DIAGNOSIS — L89154 Pressure ulcer of sacral region, stage 4: Secondary | ICD-10-CM | POA: Diagnosis not present

## 2016-07-27 DIAGNOSIS — L89154 Pressure ulcer of sacral region, stage 4: Secondary | ICD-10-CM | POA: Diagnosis not present

## 2016-07-27 DIAGNOSIS — L89144 Pressure ulcer of left lower back, stage 4: Secondary | ICD-10-CM | POA: Diagnosis not present

## 2016-07-27 DIAGNOSIS — L89894 Pressure ulcer of other site, stage 4: Secondary | ICD-10-CM | POA: Diagnosis not present

## 2016-07-28 ENCOUNTER — Encounter (HOSPITAL_COMMUNITY): Payer: Self-pay | Admitting: *Deleted

## 2016-07-28 ENCOUNTER — Inpatient Hospital Stay (HOSPITAL_COMMUNITY)
Admission: EM | Admit: 2016-07-28 | Discharge: 2016-08-03 | DRG: 698 | Disposition: A | Discharge status: Skilled Nursing Facility | Payer: Medicare Other | Attending: Internal Medicine | Admitting: Internal Medicine

## 2016-07-28 DIAGNOSIS — IMO0002 Reserved for concepts with insufficient information to code with codable children: Secondary | ICD-10-CM | POA: Diagnosis present

## 2016-07-28 DIAGNOSIS — A419 Sepsis, unspecified organism: Secondary | ICD-10-CM | POA: Diagnosis not present

## 2016-07-28 DIAGNOSIS — R1310 Dysphagia, unspecified: Secondary | ICD-10-CM | POA: Diagnosis present

## 2016-07-28 DIAGNOSIS — Z818 Family history of other mental and behavioral disorders: Secondary | ICD-10-CM

## 2016-07-28 DIAGNOSIS — E43 Unspecified severe protein-calorie malnutrition: Secondary | ICD-10-CM | POA: Diagnosis not present

## 2016-07-28 DIAGNOSIS — R7989 Other specified abnormal findings of blood chemistry: Secondary | ICD-10-CM | POA: Diagnosis present

## 2016-07-28 DIAGNOSIS — N3000 Acute cystitis without hematuria: Secondary | ICD-10-CM | POA: Diagnosis not present

## 2016-07-28 DIAGNOSIS — Z960 Presence of urogenital implants: Secondary | ICD-10-CM

## 2016-07-28 DIAGNOSIS — G8220 Paraplegia, unspecified: Secondary | ICD-10-CM | POA: Diagnosis present

## 2016-07-28 DIAGNOSIS — E538 Deficiency of other specified B group vitamins: Secondary | ICD-10-CM | POA: Diagnosis present

## 2016-07-28 DIAGNOSIS — Z978 Presence of other specified devices: Secondary | ICD-10-CM

## 2016-07-28 DIAGNOSIS — L89614 Pressure ulcer of right heel, stage 4: Secondary | ICD-10-CM | POA: Diagnosis not present

## 2016-07-28 DIAGNOSIS — L89894 Pressure ulcer of other site, stage 4: Secondary | ICD-10-CM | POA: Diagnosis not present

## 2016-07-28 DIAGNOSIS — Z7401 Bed confinement status: Secondary | ICD-10-CM

## 2016-07-28 DIAGNOSIS — N319 Neuromuscular dysfunction of bladder, unspecified: Secondary | ICD-10-CM | POA: Diagnosis present

## 2016-07-28 DIAGNOSIS — T83098A Other mechanical complication of other indwelling urethral catheter, initial encounter: Secondary | ICD-10-CM | POA: Diagnosis not present

## 2016-07-28 DIAGNOSIS — Z87891 Personal history of nicotine dependence: Secondary | ICD-10-CM

## 2016-07-28 DIAGNOSIS — G114 Hereditary spastic paraplegia: Secondary | ICD-10-CM | POA: Diagnosis not present

## 2016-07-28 DIAGNOSIS — I959 Hypotension, unspecified: Secondary | ICD-10-CM | POA: Diagnosis present

## 2016-07-28 DIAGNOSIS — T839XXD Unspecified complication of genitourinary prosthetic device, implant and graft, subsequent encounter: Secondary | ICD-10-CM

## 2016-07-28 DIAGNOSIS — Z681 Body mass index (BMI) 19 or less, adult: Secondary | ICD-10-CM

## 2016-07-28 DIAGNOSIS — L89154 Pressure ulcer of sacral region, stage 4: Secondary | ICD-10-CM | POA: Diagnosis not present

## 2016-07-28 DIAGNOSIS — L8993 Pressure ulcer of unspecified site, stage 3: Secondary | ICD-10-CM | POA: Diagnosis present

## 2016-07-28 DIAGNOSIS — G35D Multiple sclerosis, unspecified: Secondary | ICD-10-CM | POA: Diagnosis present

## 2016-07-28 DIAGNOSIS — T83511A Infection and inflammatory reaction due to indwelling urethral catheter, initial encounter: Secondary | ICD-10-CM | POA: Diagnosis not present

## 2016-07-28 DIAGNOSIS — Y846 Urinary catheterization as the cause of abnormal reaction of the patient, or of later complication, without mention of misadventure at the time of the procedure: Secondary | ICD-10-CM | POA: Diagnosis present

## 2016-07-28 DIAGNOSIS — G35 Multiple sclerosis: Secondary | ICD-10-CM | POA: Diagnosis present

## 2016-07-28 DIAGNOSIS — N368 Other specified disorders of urethra: Secondary | ICD-10-CM | POA: Diagnosis present

## 2016-07-28 DIAGNOSIS — N390 Urinary tract infection, site not specified: Secondary | ICD-10-CM | POA: Diagnosis present

## 2016-07-28 LAB — BASIC METABOLIC PANEL
Anion gap: 12 (ref 5–15)
BUN: 11 mg/dL (ref 6–20)
CO2: 29 mmol/L (ref 22–32)
CREATININE: 0.35 mg/dL — AB (ref 0.44–1.00)
Calcium: 9.7 mg/dL (ref 8.9–10.3)
Chloride: 100 mmol/L — ABNORMAL LOW (ref 101–111)
GFR calc Af Amer: >60 mL/min (ref >60)
Glucose, Bld: 111 mg/dL — ABNORMAL HIGH (ref 65–99)
Potassium: 4.4 mmol/L (ref 3.5–5.1)
SODIUM: 141 mmol/L (ref 135–145)

## 2016-07-28 LAB — URINALYSIS, ROUTINE W REFLEX MICROSCOPIC
Bilirubin Urine: NEGATIVE
GLUCOSE, UA: NEGATIVE mg/dL
Hgb urine dipstick: NEGATIVE
Ketones, ur: NEGATIVE mg/dL
Nitrite: NEGATIVE
PH: 6 (ref 5.0–8.0)
PROTEIN: 100 mg/dL — AB
Specific Gravity, Urine: 1.019 (ref 1.005–1.030)

## 2016-07-28 LAB — CBC WITH DIFFERENTIAL/PLATELET
BASOS ABS: 0 10*3/uL (ref 0.0–0.1)
Basophils Relative: 0 %
Eosinophils Absolute: 0.3 10*3/uL (ref 0.0–0.7)
Eosinophils Relative: 4 %
HEMATOCRIT: 37.7 % (ref 36.0–46.0)
HEMOGLOBIN: 11.4 g/dL — AB (ref 12.0–15.0)
LYMPHS PCT: 19 %
Lymphs Abs: 1.8 10*3/uL (ref 0.7–4.0)
MCH: 25.9 pg — ABNORMAL LOW (ref 26.0–34.0)
MCHC: 30.2 g/dL (ref 30.0–36.0)
MCV: 85.7 fL (ref 78.0–100.0)
MONO ABS: 0.5 10*3/uL (ref 0.1–1.0)
Monocytes Relative: 5 %
NEUTROS ABS: 6.5 10*3/uL (ref 1.7–7.7)
Neutrophils Relative %: 72 %
Platelets: 715 10*3/uL — ABNORMAL HIGH (ref 150–400)
RBC: 4.4 MIL/uL (ref 3.87–5.11)
RDW: 15.1 % (ref 11.5–15.5)
WBC: 9.1 10*3/uL (ref 4.0–10.5)

## 2016-07-28 LAB — I-STAT CG4 LACTIC ACID, ED: Lactic Acid, Venous: 3.53 mmol/L (ref 0.5–1.9)

## 2016-07-28 MED ORDER — PIPERACILLIN-TAZOBACTAM 3.375 G IVPB
3.3750 g | Freq: Three times a day (TID) | INTRAVENOUS | Status: DC
  Administered 2016-07-29 – 2016-07-31 (×7): 3.375 g via INTRAVENOUS
  Filled 2016-07-28 (×10): qty 50

## 2016-07-28 MED ORDER — SODIUM CHLORIDE 0.9 % IV BOLUS (SEPSIS)
500.0000 mL | Freq: Once | INTRAVENOUS | Status: AC
  Administered 2016-07-28: 500 mL via INTRAVENOUS

## 2016-07-28 MED ORDER — VANCOMYCIN HCL IN DEXTROSE 1-5 GM/200ML-% IV SOLN
1000.0000 mg | Freq: Once | INTRAVENOUS | Status: AC
  Administered 2016-07-28: 1000 mg via INTRAVENOUS
  Filled 2016-07-28: qty 200

## 2016-07-28 MED ORDER — PIPERACILLIN-TAZOBACTAM 3.375 G IVPB 30 MIN
3.3750 g | Freq: Once | INTRAVENOUS | Status: AC
  Administered 2016-07-28: 3.375 g via INTRAVENOUS
  Filled 2016-07-28: qty 50

## 2016-07-28 MED ORDER — VANCOMYCIN HCL IN DEXTROSE 750-5 MG/150ML-% IV SOLN
750.0000 mg | INTRAVENOUS | Status: DC
  Administered 2016-07-29 – 2016-07-30 (×2): 750 mg via INTRAVENOUS
  Filled 2016-07-28 (×2): qty 150

## 2016-07-28 NOTE — ED Notes (Signed)
Unsuccessful foley placement attempt x2.

## 2016-07-28 NOTE — ED Notes (Signed)
Dr Carey Bullocks RN, and Katie RN all attempted foley catheter placement. Patient urethra found to be swollen and difficult to penetrate with the foley catheter. Coude catheter attempted without success. Patient tolerated both procedures well. Dr Laverta Baltimore to consult urology for catheter placement. Patient updated on POC.

## 2016-07-28 NOTE — ED Triage Notes (Signed)
Patient is alert and oriented x4.  She is complaining of needing to have her foley cath replaced.  Patient is not showing any signs of distress on arrival to the Ed.  Patient denies any pain

## 2016-07-28 NOTE — ED Notes (Signed)
Notified EDP,Long,MD., pt. I-stat CG4 Lactic acid results 3.53 and RN,Liza made aware.

## 2016-07-28 NOTE — ED Notes (Signed)
Bladder scan showed 30 ml in bladder.

## 2016-07-28 NOTE — ED Notes (Signed)
Both IV team w/US IV and RN Learta Codding were not able to get blood cultures as well as several other blood draw attempts.  MD aware.  Will give abx now per his verbal order.

## 2016-07-28 NOTE — ED Notes (Signed)
Attempted lab draw x 2 but unsuccessful. Notified EDP,Long,MD. Made aware and RN,Liza.

## 2016-07-28 NOTE — Progress Notes (Signed)
Pharmacy Antibiotic Note  Deanna Baldwin is a 41 y.o. female admitted on 07/28/2016 with urinary retention and sepsis.  PMH of MS, malnutrition, chronic pressure ulcers, chronic indwelling foley catheter at SNF.  Pharmacy has been consulted for Vancomycin and Zosyn dosing.  Tm 100.1 WBC 9.1 SCr 0.35, CrCl ~ 55 ml/min Lactic acid 3.53  Plan:  Zosyn 3.375g IV Q8H infused over 4hrs.  Vancomycin 1g IV x1 then 750 IV q24h.  Measure Vanc trough at steady state.  Daily SCr  Follow up renal fxn, culture results, and clinical course.   Height: 5\' 9"  (175.3 cm) Weight: 82 lb (37.2 kg) IBW/kg (Calculated) : 66.2  Temp (24hrs), Avg:99.1 F (37.3 C), Min:98 F (36.7 C), Max:100.1 F (37.8 C)   Recent Labs Lab 07/28/16 1807 07/28/16 2039 07/28/16 2046  WBC  --  9.1  --   CREATININE 0.35*  --   --   LATICACIDVEN  --   --  3.53*    Estimated Creatinine Clearance: 54.9 mL/min (A) (by C-G formula based on SCr of 0.35 mg/dL (L)).    No Known Allergies  Antimicrobials this admission: 6/27 Vanc >>  6/27 Zosyn >>   Dose adjustments this admission:   Microbiology results: 6/27 BCx: ordered 6/27 UCx: sent  Thank you for allowing pharmacy to be a part of this patient's care.  Gretta Arab PharmD, BCPS Pager [phone] 07/28/2016 9:55 PM

## 2016-07-28 NOTE — ED Provider Notes (Signed)
Emergency Department Provider Note   I have reviewed the triage vital signs and the nursing notes.   HISTORY  Chief Complaint Urinary Retention (foley replacement)   HPI Deanna Baldwin is a 41 y.o. female with PMH of MS and malnutrition currently in SNF with indwelling foley catheter after recent hospital admission for sepsis presents to the emergency department for evaluation of urinary retention. Patient states that the last time her Foley catheter was draining easily was last night. She denies any abdominal pain, fevers, chills. She discussed with her nurse at the SNF regarding Foley catheter replacement and was referred to the emergency department. She denies any pain near the Foley catheter insertion site. No obvious blood in the urine last night. No radiation of symptoms.   Past Medical History:  Diagnosis Date  . Buttock wound 03/03/2016  . MS (multiple sclerosis) ([place])   . Pneumonia 02/2016  . Protein calorie malnutrition ([place])   . Severe sepsis ([place]) 03/03/2016  . UTI (urinary tract infection) 02/2016    Patient Active Problem List   Diagnosis Date Noted  . Vitamin B 12 deficiency 07/09/2016  . Sepsis due to urinary tract infection ([place])   . Pressure ulcer, stage 4 ([place]) 07/08/2016  . Foley catheter problem ([place])   . Sepsis ([place]) 07/07/2016  . Unintentional weight loss 07/06/2016  . Relapsing remitting multiple sclerosis ([place]) 06/08/2016  . 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 Neurogenic bladder 04/09/2016  . Foley catheter in place 04/09/2016  . Tachycardia 04/08/2016  . Anemia of chronic disease 04/06/2016  . Dysphagia, oral phase 03/17/2016  . Neutropenia ([place])   . Muscle abscess   . Myofascitis   . Aspiration pneumonia of left lower lobe due to vomit ([place])   . Sacral decubitus ulcer, stage III ([place])   . Multiple sclerosis ([place])   . Protein-calorie malnutrition, severe ([place])   . Hypokalemia 03/05/2016  . Hypernatremia   . Pressure ulcer, stage 3  ([place]) 03/03/2016  . 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 Community acquired pneumonia 03/02/2016  . UTI (urinary tract infection) 03/02/2016  . Abnormal LFTs 03/02/2016    Past Surgical History:  Procedure Laterality Date  . NO PAST SURGERIES      Current Outpatient Rx  . Order #: [id number] Class: Historical Med  . Order #: [id number] Class: Historical Med  . Order #: [id number] Class: Historical Med  . Order #: [id number] Class: Normal  . Order #: [id number] Class: Normal  . Order #: [id number] Class: Historical Med  . Order #: [id number] Class: Normal  . Order #: [id number] Class: Historical Med  . Order #: [id number] Class: Historical Med  . Order #: [id number] Class: Historical Med  . Order #: [id number] Class: Historical Med  . Order #: [id number] Class: Historical Med    Allergies Patient has no known allergies.  Family History  Problem Relation Age of Onset  . Mental illness Sister     Social History Social History  Substance Use Topics  . Smoking status: Former Smoker    Packs/day: 1.00    Types: Cigarettes    Quit date: 02/01/2010  . Smokeless tobacco: Never Used  . Alcohol use No    Review of Systems  Constitutional: No fever/chills Eyes: No visual changes. ENT: No sore throat. Cardiovascular: Denies chest pain. Respiratory: Denies shortness of breath. Gastrointestinal: No abdominal pain.  No nausea, no vomiting.  No diarrhea.  No constipation. Genitourinary: Negative for dysuria. Positive urinary retention.  Musculoskeletal: Negative for back pain. Skin: Negative for rash. Neurological: Negative for  headaches, focal weakness or numbness.  10-point ROS otherwise negative.  ____________________________________________   PHYSICAL EXAM:  VITAL SIGNS: ED Triage Vitals  Enc Vitals Group     BP 07/28/16 1405 100/72     Pulse Rate 07/28/16 1405 (!) 120     Resp 07/28/16 1405 20     Temp 07/28/16 1405 98 F (36.7 C)     Temp Source  07/28/16 1405 Oral     SpO2 07/28/16 1405 99 %     Weight 07/28/16 1352 82 lb (37.2 kg)     Height 07/28/16 1352 5\' 9"  (1.753 m)   Constitutional: Alert and oriented. Well appearing and in no acute distress. Thin and chronically ill-appearing.  Eyes: Conjunctivae are normal.  Head: Atraumatic. Nose: No congestion/rhinnorhea. Mouth/Throat: Mucous membranes are moist.  Oropharynx non-erythematous. Neck: No stridor.   Cardiovascular: Sinus tachycardia. Good peripheral circulation. Grossly normal heart sounds.   Respiratory: Normal respiratory effort.  No retractions. Lungs CTAB. Gastrointestinal: Soft and nontender. No distention.  Musculoskeletal: No lower extremity tenderness nor edema. Contracted lower extremities.  Neurologic:  Normal speech and language. Contracted lower extremities. Normal motor of the B/L upper extremities.  Skin:  Skin is warm and dry. Dry bandages covering multiple ulcers on feet and sacrum.  Psychiatric: Mood and affect are normal. Speech and behavior are normal.  ____________________________________________   LABS (all labs ordered are listed, but only abnormal results are displayed)  Labs Reviewed  BASIC METABOLIC PANEL - Abnormal; Notable for the following:       Result Value   Chloride 100 (*)    Glucose, Bld 111 (*)    Creatinine, Ser 0.35 (*)    All other components within normal limits  URINALYSIS, ROUTINE W REFLEX MICROSCOPIC - Abnormal; Notable for the following:    APPearance HAZY (*)    Protein, ur 100 (*)    Leukocytes, UA LARGE (*)    Bacteria, UA FEW (*)    Squamous Epithelial / LPF 0-5 (*)    All other components within normal limits  CBC WITH DIFFERENTIAL/PLATELET - Abnormal; Notable for the following:    Hemoglobin 11.4 (*)    MCH 25.9 (*)    Platelets 715 (*)    All other components within normal limits  I-STAT CG4 LACTIC ACID, ED - Abnormal; Notable for the following:    Lactic Acid, Venous 3.53 (*)    All other components within  normal limits  URINE CULTURE  CREATININE, SERUM   ____________________________________________  RADIOLOGY  None ____________________________________________   PROCEDURES  Procedure(s) performed:   Procedures  CRITICAL CARE Performed by: Margette Fast Total critical care time: 30 minutes Critical care time was exclusive of separately billable procedures and treating other patients. Critical care was necessary to treat or prevent imminent or life-threatening deterioration. Critical care was time spent personally by me on the following activities: development of treatment plan with patient and/or surrogate as well as nursing, discussions with consultants, evaluation of patient's response to treatment, examination of patient, obtaining history from patient or surrogate, ordering and performing treatments and interventions, ordering and review of laboratory studies, ordering and review of radiographic studies, pulse oximetry and re-evaluation of patient's condition.  Nanda Quinton, MD Emergency Medicine  ____________________________________________   INITIAL IMPRESSION / ASSESSMENT AND PLAN / ED COURSE  Pertinent labs & imaging results that were available during my care of the patient were reviewed by me and considered in my medical decision making (see chart for details).  Patient presents to  the emergency department for evaluation of urinary retention. She has chronic indwelling Foley with history of MS. patient does have sinus tachycardia here but largely unremarkable blood pressures. No other signs or symptoms to suggest underlying infection. Plan to exchange the patient's Foley catheter and obtain a BMP to assess electrolytes and kidney function. We'll also send for urinalysis. Patient not currently on antibiotics. Tachycardia during recent admission thought to be chronic as opposed to secondary to infection.   Patient with lactic acid elevated at 3.53. She is very difficult IV  access and IV team was called who successfully established a peripheral IV access. Nursing staff was unable to draw back cultures from the sample. Urology was also counseled that if she has a difficult Foley catheter placement. Foley catheter was successfully placed. Patient receiving IV fluids and antibiotics. Her tachycardia and borderline hypertension are thought to be chronic with elevated lactic acid and significant findings on UA I have concern for developing sepsis. The family medicine teaching service was paged to discuss admission and transfer.   Discussed patient's case with Hospitalist, Dr. [name]. Patient and family (if present) updated with plan. Care transferred to Hospitalist service.  I reviewed all nursing notes, vitals, pertinent old records, EKGs, labs, imaging (as available).  ____________________________________________  FINAL CLINICAL IMPRESSION(S) / ED DIAGNOSES  Final diagnoses:  Problem with Foley catheter, subsequent encounter  Acute cystitis without hematuria  Sepsis, due to unspecified organism [facility])     MEDICATIONS GIVEN DURING THIS VISIT:  Medications  vancomycin (VANCOCIN) IVPB 750 mg/150 ml premix (not administered)  piperacillin-tazobactam (ZOSYN) IVPB 3.375 g (not administered)  sodium chloride 0.9 % bolus 500 mL (500 mLs Intravenous New Bag/Given 07/28/16 2145)  piperacillin-tazobactam (ZOSYN) IVPB 3.375 g (0 g Intravenous Stopped 07/28/16 2219)  vancomycin (VANCOCIN) IVPB 1000 mg/200 mL premix (0 mg Intravenous Stopped 07/28/16 2337)     NEW OUTPATIENT MEDICATIONS STARTED DURING THIS VISIT:  None   Note:  This document was prepared using Dragon voice recognition software and may include unintentional dictation errors.  Nanda Quinton, MD Emergency Medicine   Blanca Carreon, [facility], MD 07/29/16 [phone]

## 2016-07-28 NOTE — ED Notes (Signed)
MD aware of unsuccessful Foley cath attempts

## 2016-07-29 ENCOUNTER — Inpatient Hospital Stay (HOSPITAL_COMMUNITY): Payer: Medicare Other

## 2016-07-29 DIAGNOSIS — R7989 Other specified abnormal findings of blood chemistry: Secondary | ICD-10-CM | POA: Diagnosis present

## 2016-07-29 DIAGNOSIS — D509 Iron deficiency anemia, unspecified: Secondary | ICD-10-CM | POA: Diagnosis not present

## 2016-07-29 DIAGNOSIS — R6520 Severe sepsis without septic shock: Secondary | ICD-10-CM | POA: Diagnosis not present

## 2016-07-29 DIAGNOSIS — E876 Hypokalemia: Secondary | ICD-10-CM | POA: Diagnosis not present

## 2016-07-29 DIAGNOSIS — M791 Myalgia: Secondary | ICD-10-CM | POA: Diagnosis not present

## 2016-07-29 DIAGNOSIS — N390 Urinary tract infection, site not specified: Secondary | ICD-10-CM

## 2016-07-29 DIAGNOSIS — Z7401 Bed confinement status: Secondary | ICD-10-CM | POA: Diagnosis not present

## 2016-07-29 DIAGNOSIS — E538 Deficiency of other specified B group vitamins: Secondary | ICD-10-CM | POA: Diagnosis present

## 2016-07-29 DIAGNOSIS — N319 Neuromuscular dysfunction of bladder, unspecified: Secondary | ICD-10-CM | POA: Diagnosis present

## 2016-07-29 DIAGNOSIS — L89154 Pressure ulcer of sacral region, stage 4: Secondary | ICD-10-CM | POA: Diagnosis present

## 2016-07-29 DIAGNOSIS — L89009 Pressure ulcer of unspecified elbow, unspecified stage: Secondary | ICD-10-CM | POA: Diagnosis not present

## 2016-07-29 DIAGNOSIS — M6281 Muscle weakness (generalized): Secondary | ICD-10-CM | POA: Diagnosis not present

## 2016-07-29 DIAGNOSIS — N318 Other neuromuscular dysfunction of bladder: Secondary | ICD-10-CM | POA: Diagnosis not present

## 2016-07-29 DIAGNOSIS — A419 Sepsis, unspecified organism: Secondary | ICD-10-CM | POA: Diagnosis not present

## 2016-07-29 DIAGNOSIS — L89303 Pressure ulcer of unspecified buttock, stage 3: Secondary | ICD-10-CM

## 2016-07-29 DIAGNOSIS — Z9289 Personal history of other medical treatment: Secondary | ICD-10-CM | POA: Diagnosis not present

## 2016-07-29 DIAGNOSIS — Y846 Urinary catheterization as the cause of abnormal reaction of the patient, or of later complication, without mention of misadventure at the time of the procedure: Secondary | ICD-10-CM | POA: Diagnosis present

## 2016-07-29 DIAGNOSIS — L8992 Pressure ulcer of unspecified site, stage 2: Secondary | ICD-10-CM | POA: Diagnosis not present

## 2016-07-29 DIAGNOSIS — E43 Unspecified severe protein-calorie malnutrition: Secondary | ICD-10-CM | POA: Diagnosis present

## 2016-07-29 DIAGNOSIS — R1312 Dysphagia, oropharyngeal phase: Secondary | ICD-10-CM | POA: Diagnosis not present

## 2016-07-29 DIAGNOSIS — R945 Abnormal results of liver function studies: Secondary | ICD-10-CM | POA: Diagnosis not present

## 2016-07-29 DIAGNOSIS — Z681 Body mass index (BMI) 19 or less, adult: Secondary | ICD-10-CM | POA: Diagnosis not present

## 2016-07-29 DIAGNOSIS — T83511A Infection and inflammatory reaction due to indwelling urethral catheter, initial encounter: Secondary | ICD-10-CM | POA: Diagnosis present

## 2016-07-29 DIAGNOSIS — I959 Hypotension, unspecified: Secondary | ICD-10-CM | POA: Diagnosis present

## 2016-07-29 DIAGNOSIS — G8220 Paraplegia, unspecified: Secondary | ICD-10-CM | POA: Diagnosis present

## 2016-07-29 DIAGNOSIS — E870 Hyperosmolality and hypernatremia: Secondary | ICD-10-CM | POA: Diagnosis not present

## 2016-07-29 DIAGNOSIS — N368 Other specified disorders of urethra: Secondary | ICD-10-CM | POA: Diagnosis present

## 2016-07-29 DIAGNOSIS — S31809A Unspecified open wound of unspecified buttock, initial encounter: Secondary | ICD-10-CM | POA: Diagnosis not present

## 2016-07-29 DIAGNOSIS — G35 Multiple sclerosis: Secondary | ICD-10-CM

## 2016-07-29 DIAGNOSIS — R1310 Dysphagia, unspecified: Secondary | ICD-10-CM | POA: Diagnosis present

## 2016-07-29 DIAGNOSIS — L89894 Pressure ulcer of other site, stage 4: Secondary | ICD-10-CM | POA: Diagnosis present

## 2016-07-29 DIAGNOSIS — Z87891 Personal history of nicotine dependence: Secondary | ICD-10-CM | POA: Diagnosis not present

## 2016-07-29 DIAGNOSIS — K6812 Psoas muscle abscess: Secondary | ICD-10-CM | POA: Diagnosis not present

## 2016-07-29 DIAGNOSIS — N3941 Urge incontinence: Secondary | ICD-10-CM | POA: Diagnosis not present

## 2016-07-29 DIAGNOSIS — Z818 Family history of other mental and behavioral disorders: Secondary | ICD-10-CM | POA: Diagnosis not present

## 2016-07-29 DIAGNOSIS — N3000 Acute cystitis without hematuria: Secondary | ICD-10-CM | POA: Diagnosis not present

## 2016-07-29 DIAGNOSIS — G114 Hereditary spastic paraplegia: Secondary | ICD-10-CM | POA: Diagnosis not present

## 2016-07-29 DIAGNOSIS — T839XXD Unspecified complication of genitourinary prosthetic device, implant and graft, subsequent encounter: Secondary | ICD-10-CM | POA: Diagnosis not present

## 2016-07-29 DIAGNOSIS — L89614 Pressure ulcer of right heel, stage 4: Secondary | ICD-10-CM | POA: Diagnosis present

## 2016-07-29 LAB — BASIC METABOLIC PANEL
ANION GAP: 9 (ref 5–15)
BUN: 11 mg/dL (ref 6–20)
CALCIUM: 9.1 mg/dL (ref 8.9–10.3)
CO2: 27 mmol/L (ref 22–32)
Chloride: 105 mmol/L (ref 101–111)
Creatinine, Ser: 0.33 mg/dL — ABNORMAL LOW (ref 0.44–1.00)
GFR calc non Af Amer: >60 mL/min (ref >60)
GLUCOSE: 110 mg/dL — AB (ref 65–99)
POTASSIUM: 4 mmol/L (ref 3.5–5.1)
Sodium: 141 mmol/L (ref 135–145)

## 2016-07-29 LAB — CBC
HEMATOCRIT: 30.5 % — AB (ref 36.0–46.0)
HEMOGLOBIN: 9 g/dL — AB (ref 12.0–15.0)
MCH: 24.9 pg — ABNORMAL LOW (ref 26.0–34.0)
MCHC: 29.5 g/dL — ABNORMAL LOW (ref 30.0–36.0)
MCV: 84.5 fL (ref 78.0–100.0)
Platelets: 442 10*3/uL — ABNORMAL HIGH (ref 150–400)
RBC: 3.61 MIL/uL — AB (ref 3.87–5.11)
RDW: 15.1 % (ref 11.5–15.5)
WBC: 9.2 10*3/uL (ref 4.0–10.5)

## 2016-07-29 LAB — LACTIC ACID, PLASMA
Lactic Acid, Venous: 1.3 mmol/L (ref 0.5–1.9)
Lactic Acid, Venous: 0.7 mmol/L (ref 0.5–1.9)

## 2016-07-29 LAB — PROCALCITONIN: PROCALCITONIN: 0.19 ng/mL

## 2016-07-29 MED ORDER — ADULT MULTIVITAMIN W/MINERALS CH
1.0000 | ORAL_TABLET | Freq: Every day | ORAL | Status: DC
  Administered 2016-07-29 – 2016-08-03 (×5): 1 via ORAL
  Filled 2016-07-29 (×5): qty 1

## 2016-07-29 MED ORDER — COLLAGENASE 250 UNIT/GM EX OINT
TOPICAL_OINTMENT | Freq: Every day | CUTANEOUS | Status: DC
  Administered 2016-07-29 – 2016-08-02 (×5): via TOPICAL
  Filled 2016-07-29: qty 90

## 2016-07-29 MED ORDER — POLYETHYLENE GLYCOL 3350 17 G PO PACK
17.0000 g | PACK | Freq: Every day | ORAL | Status: DC
  Administered 2016-07-29 – 2016-08-03 (×3): 17 g via ORAL
  Filled 2016-07-29 (×4): qty 1

## 2016-07-29 MED ORDER — ACETAMINOPHEN 325 MG PO TABS
650.0000 mg | ORAL_TABLET | Freq: Four times a day (QID) | ORAL | Status: DC | PRN
  Administered 2016-07-31: 650 mg via ORAL
  Filled 2016-07-29: qty 2

## 2016-07-29 MED ORDER — ONDANSETRON HCL 4 MG/2ML IJ SOLN: 4.0000 mg | Freq: Four times a day (QID) | INTRAMUSCULAR | Status: DC | PRN

## 2016-07-29 MED ORDER — PRO-STAT SUGAR FREE PO LIQD
30.0000 mL | Freq: Three times a day (TID) | ORAL | Status: DC
  Administered 2016-07-29 – 2016-07-31 (×4): 30 mL via ORAL
  Filled 2016-07-29 (×7): qty 30

## 2016-07-29 MED ORDER — MIRTAZAPINE 15 MG PO TABS
7.5000 mg | ORAL_TABLET | Freq: Every day | ORAL | Status: DC
  Administered 2016-07-29 – 2016-08-02 (×5): 7.5 mg via ORAL
  Filled 2016-07-29 (×5): qty 1

## 2016-07-29 MED ORDER — BISACODYL 5 MG PO TBEC
5.0000 mg | DELAYED_RELEASE_TABLET | Freq: Every day | ORAL | Status: DC | PRN
  Filled 2016-07-29: qty 1

## 2016-07-29 MED ORDER — SODIUM CHLORIDE 0.9% FLUSH
3.0000 mL | Freq: Two times a day (BID) | INTRAVENOUS | Status: DC
  Administered 2016-07-30 – 2016-08-01 (×3): 3 mL via INTRAVENOUS

## 2016-07-29 MED ORDER — ONDANSETRON HCL 4 MG PO TABS: 4.0000 mg | ORAL_TABLET | Freq: Four times a day (QID) | ORAL | Status: DC | PRN

## 2016-07-29 MED ORDER — SODIUM CHLORIDE 0.9 % IV BOLUS (SEPSIS)
1000.0000 mL | Freq: Once | INTRAVENOUS | Status: AC
  Administered 2016-07-29: 1000 mL via INTRAVENOUS

## 2016-07-29 MED ORDER — MIRTAZAPINE 15 MG PO TABS: 0.5000 mg | ORAL_TABLET | Freq: Every day | ORAL | Status: DC

## 2016-07-29 MED ORDER — FERROUS SULFATE 325 (65 FE) MG PO TABS
325.0000 mg | ORAL_TABLET | Freq: Two times a day (BID) | ORAL | Status: DC
  Administered 2016-07-29 – 2016-08-03 (×10): 325 mg via ORAL
  Filled 2016-07-29 (×11): qty 1

## 2016-07-29 MED ORDER — ENOXAPARIN SODIUM 30 MG/0.3ML ~~LOC~~ SOLN
30.0000 mg | SUBCUTANEOUS | Status: DC
  Administered 2016-07-29 – 2016-08-02 (×5): 30 mg via SUBCUTANEOUS
  Filled 2016-07-29 (×4): qty 0.3

## 2016-07-29 MED ORDER — SODIUM CHLORIDE 0.9 % IV SOLN
INTRAVENOUS | Status: DC
  Administered 2016-07-29 – 2016-07-31 (×4): via INTRAVENOUS

## 2016-07-29 MED ORDER — ACETAMINOPHEN 650 MG RE SUPP: 650.0000 mg | Freq: Four times a day (QID) | RECTAL | Status: DC | PRN

## 2016-07-29 MED ORDER — SODIUM CHLORIDE 0.9 % IV BOLUS (SEPSIS)
250.0000 mL | Freq: Once | INTRAVENOUS | Status: AC
  Administered 2016-07-29: 250 mL via INTRAVENOUS

## 2016-07-29 MED ORDER — VITAMIN C 500 MG PO TABS
500.0000 mg | ORAL_TABLET | Freq: Two times a day (BID) | ORAL | Status: DC
  Administered 2016-07-29 – 2016-08-03 (×10): 500 mg via ORAL
  Filled 2016-07-29 (×11): qty 1

## 2016-07-29 MED ORDER — ENOXAPARIN SODIUM 40 MG/0.4ML ~~LOC~~ SOLN
40.0000 mg | SUBCUTANEOUS | Status: DC
  Filled 2016-07-29 (×2): qty 0.4

## 2016-07-29 NOTE — Progress Notes (Signed)
@IPLOG @        PROGRESS NOTE                                                                                                                                                                                                             Patient Demographics:    Deanna Baldwin, is a 41 y.o. female, DOB - May 18, 1975, GGY:[id number]  Admit date - 07/28/2016   Admitting Physician No admitting provider for patient encounter.  Outpatient Primary MD for the patient is [name], DO  LOS - 0  Chief Complaint  Patient presents with  . Urinary Retention    foley replacement       Brief Narrative  Deanna Baldwin is a 41 y.o. woman with a history of MS (diagnosed 18 years ago), spastic paraplegia, chronic sacral decubitus ulcers, and chronic indwelling foley catheter who presented to the ED for foley catheter exchange, but she was found to have relative hypotension with low grade fever to 100.1. Further workup showed she had a UTI, Foley was changed in the ER and she was admitted for further care.   Subjective:    [facility] today has, No headache, No chest pain, No abdominal pain - No Nausea, No new weakness tingling or numbness, No Cough - SOB.     Assessment  & Plan :     1. Sepsis secondary to UTI. Also multiple decubitus ulcers including left gluteal wound which appears unstageable. For now Foley catheter has been changed and she has been placed on empiric IV vancomycin and Zosyn. Clinically stable and sepsis physiology seems to have resolved, note she has chronically low blood pressures with systolic ranging between 75 and 90. Continue wound care through wound care nursing staff, monitor cultures  2. Advanced MS with chronic generalized deconditioning and weakness, bedbound status, multiple decubitus ulcers, chronic indwelling Foley, dysphagia. On dysphagia 3 diet, Foley catheter has been changed in the ER this admission, wound care has been consulted, continue to monitor. Kindly see  wound care note for all details will be ulcers.  3. Severe protein calorie malnutrition. On protein supplementation.    Diet : DIET DYS 3 Room service appropriate? Yes; Fluid consistency: Honey Thick    Family Communication  :  None  Code Status :  Full  Disposition Plan  :  TBD  Consults  :  None  Procedures  :    DVT Prophylaxis  :  Lovenox    Lab Results  Component Value Date   PLT 442 (H) 07/29/2016  Inpatient Medications  Scheduled Meds: . collagenase   Topical Daily  . enoxaparin (LOVENOX) injection  40 mg Subcutaneous Q24H  . feeding supplement (PRO-STAT SUGAR FREE 64)  30 mL Oral TID WC  . ferrous sulfate  325 mg Oral BID WC  . mirtazapine  7.5 mg Oral QHS  . multivitamin with minerals  1 tablet Oral Daily  . polyethylene glycol  17 g Oral Daily  . sodium chloride flush  3 mL Intravenous Q12H  . vitamin C  500 mg Oral BID   Continuous Infusions: . sodium chloride 125 mL/hr at 07/29/16 0430  . piperacillin-tazobactam (ZOSYN)  IV Stopped (07/29/16 1000)  . vancomycin     PRN Meds:.acetaminophen **OR** acetaminophen, bisacodyl, ondansetron **OR** ondansetron (ZOFRAN) IV  Antibiotics  :    Anti-infectives    Start     Dose/Rate Route Frequency Ordered Stop   07/29/16 2200  vancomycin (VANCOCIN) IVPB 750 mg/150 ml premix     750 mg 150 mL/hr over 60 Minutes Intravenous Every 24 hours 07/28/16 2206     07/29/16 0600  piperacillin-tazobactam (ZOSYN) IVPB 3.375 g     3.375 g 12.5 mL/hr over 240 Minutes Intravenous Every 8 hours 07/28/16 2206     07/28/16 2100  piperacillin-tazobactam (ZOSYN) IVPB 3.375 g     3.375 g 100 mL/hr over 30 Minutes Intravenous  Once 07/28/16 2057 07/28/16 2219   07/28/16 2100  vancomycin (VANCOCIN) IVPB 1000 mg/200 mL premix     1,000 mg 200 mL/hr over 60 Minutes Intravenous  Once 07/28/16 2057 07/28/16 2337         Objective:   Vitals:   07/29/16 0420 07/29/16 0513 07/29/16 0709 07/29/16 0924  BP: (!) 89/62 109/64  105/63 (!) 84/58  Pulse: (!) 126 (!) 122 (!) 110 (!) 114  Resp: (!) 27 (!) 22 18 16   Temp:      TempSrc:      SpO2: 94% 97% 98% 100%  Weight:      Height:        Wt Readings from Last 3 Encounters:  07/28/16 37.2 kg (82 lb)  07/12/16 37.4 kg (82 lb 6.4 oz)  07/10/16 44.2 kg (97 lb 7.1 oz)     Intake/Output Summary (Last 24 hours) at 07/29/16 1053 Last data filed at 07/28/16 2337  Gross per 24 hour  Intake              250 ml  Output                0 ml  Net              250 ml     Physical Exam  Awake Alert, Oriented X 3, No new F.N deficits, Normal affect [place].AT,PERRAL Supple Neck,No JVD, No cervical lymphadenopathy appriciated.  Symmetrical Chest wall movement, Good air movement bilaterally, CTAB RRR,No Gallops,Rubs or new Murmurs, No Parasternal Heave +ve B.Sounds, Abd Soft, No tenderness, No organomegaly appriciated, No rebound - guarding or rigidity. No Cyanosis, Clubbing or edema, No new Rash or bruise     Data Review:    CBC  Recent Labs Lab 07/28/16 2039 07/29/16 0500  WBC 9.1 9.2  HGB 11.4* 9.0*  HCT 37.7 30.5*  PLT 715* 442*  MCV 85.7 84.5  MCH 25.9* 24.9*  MCHC 30.2 29.5*  RDW 15.1 15.1  LYMPHSABS 1.8  --   MONOABS 0.5  --   EOSABS 0.3  --   BASOSABS 0.0  --  Chemistries   Recent Labs Lab 07/28/16 1807 07/29/16 0500  NA 141 141  K 4.4 4.0  CL 100* 105  CO2 29 27  GLUCOSE 111* 110*  BUN 11 11  CREATININE 0.35* 0.33*  CALCIUM 9.7 9.1   ------------------------------------------------------------------------------------------------------------------ No results for input(s): CHOL, HDL, LDLCALC, TRIG, CHOLHDL, LDLDIRECT in the last 72 hours.  Lab Results  Component Value Date   HGBA1C 5.5 06/24/2016   ------------------------------------------------------------------------------------------------------------------ No results for input(s): TSH, T4TOTAL, T3FREE, THYROIDAB in the last 72 hours.  Invalid input(s):  FREET3 ------------------------------------------------------------------------------------------------------------------ No results for input(s): VITAMINB12, FOLATE, FERRITIN, TIBC, IRON, RETICCTPCT in the last 72 hours.  Coagulation profile No results for input(s): INR, PROTIME in the last 168 hours.  No results for input(s): DDIMER in the last 72 hours.  Cardiac Enzymes No results for input(s): CKMB, TROPONINI, MYOGLOBIN in the last 168 hours.  Invalid input(s): CK ------------------------------------------------------------------------------------------------------------------ No results found for: BNP  Micro Results No results found for this or any previous visit (from the past 240 hour(s)).  Radiology Reports  Dg Chest Port 1 View  Result Date: 07/29/2016 CLINICAL DATA:  Sepsis EXAM: PORTABLE CHEST 1 VIEW COMPARISON:  07/07/2016 FINDINGS: A single AP portable view of the chest demonstrates no focal airspace consolidation or alveolar edema. The lungs are grossly clear. There is no large effusion or pneumothorax. Cardiac and mediastinal contours appear unremarkable. IMPRESSION: No active disease. Electronically Signed   By: Andreas Newport M.D.   On: 07/29/2016 01:04    Time Spent in minutes  30   Lala Lund M.D on 07/29/2016 at 10:53 AM  Between 7am to 7pm - Pager - [phone] ( page via [email or web address], text pages only, please mention full 10 digit call back number). After 7pm go to [email or web address] - password [email or web address]

## 2016-07-29 NOTE — ED Notes (Signed)
Patient turned and peri  Care with full linen change provided with [place]. Upon turning patient it was found that patient's bed was saturated with urine. Will continue to monitor foley catheter for possible leakage. 148ml of urine in catheter. Tubing stimulated and urine observed draining.

## 2016-07-29 NOTE — H&P (Signed)
History and Physical    Deanna Baldwin JIR:[id number] DOB: [birth year] DOA: 07/28/2016  PCP: [name], DO   Patient coming from: Home  Chief Complaint: Request for foley catheter exchange  HPI: Deanna Baldwin is a 41 y.o. woman with a history of MS (diagnosed 18 years ago), spastic paraplegia, chronic sacral decubitus ulcers, and chronic indwelling foley catheter who presented to the ED for foley catheter exchange, but she was found to have relative hypotension with low grade fever to 100.1.  She denies chills or sweats at home.  No dysuria or discomfort associated with the foley catheter.  No nausea or vomiting.  ED Course: Urology was called for assistance with difficult foley catheter placement.  LA level 3.53.  Normal WBC count.  Hgb 11.  Platelet count 715.  U/A shows large leukocytes, TNTC WBC, and few bacteria.  Patient received 500cc NS bolus in the ED and empiric vanc and zosyn for sepsis.  Hospitalist asked to admit.  Review of Systems: As per HPI otherwise 10 systems reviewed and negative.   Past Medical History:  Diagnosis Date  . Buttock wound 03/03/2016  . MS (multiple sclerosis) ([place])   . Pneumonia 02/2016  . Protein calorie malnutrition ([place])   . Severe sepsis ([place]) 03/03/2016  . UTI (urinary tract infection) 02/2016    Past Surgical History:  Procedure Laterality Date  . NO PAST SURGERIES       reports that she quit smoking about 6 years ago. Her smoking use included Cigarettes. She smoked 1.00 pack per day. She has never used smokeless tobacco. She reports that she does not drink alcohol or use drugs.  No Known Allergies  Family History  Problem Relation Age of Onset  . Mental illness Sister      Prior to Admission medications   Medication Sig Start Date End Date Taking? Authorizing Provider  acetaminophen (TYLENOL) 325 MG tablet Take 650 mg by mouth every morning.     [provider]  acetaminophen (TYLENOL) 325 MG tablet Take 650 mg by  mouth every 6 (six) hours as needed for mild pain.    [provider]  Amino Acids-Protein Hydrolys (FEEDING SUPPLEMENT, PRO-STAT SUGAR FREE 64,) LIQD Take 30 mLs by mouth 3 (three) times daily with meals.    [provider]  bisacodyl (DULCOLAX) 5 MG EC tablet Take 1 tablet (5 mg total) by mouth daily as needed for moderate constipation. 03/15/16   Florencia Reasons, MD  collagenase (SANTYL) ointment Apply topically daily. 07/10/16   Bufford Lope, DO  cyanocobalamin 1000 MCG tablet Take 1,000 mcg by mouth daily.    [provider]  ferrous sulfate 325 (65 FE) MG tablet Take 1 tablet (325 mg total) by mouth 2 (two) times daily with a meal. 04/08/16   Janece Canterbury, MD  mirtazapine (REMERON) 15 MG tablet Take 0.5 mg by mouth at bedtime.     [provider]  Multiple Vitamins-Minerals (DECUBI-VITE) CAPS Take 1 capsule by mouth daily.    [provider]  Nutritional Supplements (NUTRITIONAL SUPPLEMENT PO) Take 120 mLs by mouth 3 (three) times daily as needed. [place]    [provider]  polyethylene glycol (MIRALAX / GLYCOLAX) packet Take 17 g by mouth daily.    [provider]  vitamin C (ASCORBIC ACID) 500 MG tablet Take 500 mg by mouth 2 (two) times daily.    [provider]    Physical Exam: Vitals:   07/28/16 2100 07/28/16 2147  07/28/16 2230 07/28/16 2315  BP: (!) 92/59 95/72 93/69  98/66  Pulse: (!) 129 (!) 136 (!) 131 (!) 131  Resp: (!) 31 (!) 26 (!) 25 (!) 28  Temp:      TempSrc:      SpO2: 97% 97% 97% 97%  Weight:      Height:          Constitutional: NAD, calm, chronically ill appearing, cachectic Vitals:   07/28/16 2100 07/28/16 2147 07/28/16 2230 07/28/16 2315  BP: (!) 92/59 95/72 93/69  98/66  Pulse: (!) 129 (!) 136 (!) 131 (!) 131  Resp: (!) 31 (!) 26 (!) 25 (!) 28  Temp:      TempSrc:      SpO2: 97% 97% 97% 97%  Weight:      Height:       Eyes: PERRL, + nystagmus, lids and conjunctivae  normal ENMT: Mucous membranes are dry. Posterior pharynx clear of any exudate or lesions. She is missing several teeth. Neck: very thin, no masses Respiratory: clear to auscultation bilaterally, no wheezing, no crackles. Normal respiratory effort. No accessory muscle use.  Cardiovascular: Tachycardic but regular.  No murmurs / rubs / gallops. No extremity edema. 2+ pedal pulses.   GI: abdomen is soft and compressible.  No distention.  No tenderness.  Bowel sounds are present. Musculoskeletal:  Bilateral lower extremity contractures with reduced ROM.  Muscle wasting in all extremities.    Skin: no rashes, warm and dry.  Bilateral heels are wrapped, in boots.  She has bilateral sacral/gluteal ulcers, present on admission, at least stage three.  The lesion on the left buttock looks worse than the right.  No active drainage but foul odor present. Neurologic: CN 2-12 grossly intact. Sensation intact, Generalized weakness. Psychiatric: Normal judgment and insight. Alert and oriented x 3. Normal mood.     Labs on Admission: I have personally reviewed following labs and imaging studies  CBC:  Recent Labs Lab 07/28/16 2039  WBC 9.1  NEUTROABS 6.5  HGB 11.4*  HCT 37.7  MCV 85.7  PLT 354*   Basic Metabolic Panel:  Recent Labs Lab 07/28/16 1807  NA 141  K 4.4  CL 100*  CO2 29  GLUCOSE 111*  BUN 11  CREATININE 0.35*  CALCIUM 9.7   GFR: Estimated Creatinine Clearance: 54.9 mL/min (A) (by C-G formula based on SCr of 0.35 mg/dL (L)).  Urine analysis:    Component Value Date/Time   COLORURINE YELLOW 07/28/2016 2125   APPEARANCEUR HAZY (A) 07/28/2016 2125   LABSPEC 1.019 07/28/2016 2125   PHURINE 6.0 07/28/2016 2125   GLUCOSEU NEGATIVE 07/28/2016 2125   HGBUR NEGATIVE 07/28/2016 2125   BILIRUBINUR NEGATIVE 07/28/2016 2125   KETONESUR NEGATIVE 07/28/2016 2125   PROTEINUR 100 (A) 07/28/2016 2125   UROBILINOGEN 0.2 06/25/2008 1551   NITRITE NEGATIVE 07/28/2016 2125   LEUKOCYTESUR  LARGE (A) 07/28/2016 2125   Sepsis Labs:  Lactic acid level 3.53  Radiological Exams on Admission: Chest xray negative for acute process.  EKG: Reviewed by me.  Sinus tachycardia.  No acute ST changes.  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  However, I am concerned about appearance of her left gluteal wound and superimposed infection there is also on the differential. --Vanc and zosyn for now (covering urine and wound/skin); can de-escalate quickly if hemodynamics improve --Blood and urine cultures --Will give 30cc/kg NS now --Repeat lactic acid level --Check procalcitonin level  Chronic sacral wounds --Wound care consult  Reactive thrombocytosis --suspect this is related to acute infection     DVT prophylaxis: Lovenox Code Status: FULL Family Communication: Patient alone in the ED at time of admission. Disposition Plan: To be determined. Consults called: NONE Admission status: Inpatient, stepdown unit.  I expect this patient will need inpatient services for greater than two midnights.   TIME SPENT: 60 minutes   Eber Jones MD [facility] Pager [phone]  If 7PM-7AM, please contact night-coverage [email or web address] Password [facility]  07/29/2016, 12:27 AM

## 2016-07-29 NOTE — Consult Note (Addendum)
[place] Nurse wound consult note Reason for Consult:multiple wounds in different stages Wound type:pressure, IAD, MASD Pressure Injury POA: Yes Measurement:Sacral healing stage IV 5cm x 4cm x 0.1cm 100% pink, moderate pink drainage, no odor noted Right heel 2.25cm x 1.25cm x 0.2 cm stage IV 30% yellow slough, 70% pink wound bed, moderate brown drainage, no odor noted Left lateral foot 1.5cm x 4cm x 0.1cm and 2cm x 2cm x 0.1cm healing stage IV wounds with pink beds, moderate tan drainage, no odor noted Left medial thigh 2cm x 1cm x 0.1cm full thickness, (non pressure related) pink bed, scant drainage, no odor Left ischial stage IV 5cm x 4cm x 0.5cm, 40% yellow slough, 60% red tissue, copious serosanguinous drainage, no odor noted. Right ischial 2cm x 2cm x 0.2cm stage IV, pink bed, moderate serosanguinous drainage, no odor noted. Right hip 2cm x 2cm suspected DTI vs healed prior ulcer Bilateral buttocks have multiple IAD MASD partial thickness wounds Wound bed:see above Drainage (amount, consistency, odor) see above Periwound: intact Dressing procedure/placement/frequency: Patient is holding in ED awaiting SDU bed, already has Prevalon boots on. I have provided nurses with orders for Santyl to Left ischial wound and Right heel wound. Aquacel ordered for other wounds. Note left on front of EMR for air loss mattress once pt transfers out of SDU. Pt currently has foley so skin care ordered to heal IAD.  Pt is malnourished, contracted, wheel chair bound, and incont of stool and urine. Will wait until pt gets a bed assignment and order a chair distribution pad. A nutritional consult would be beneficial, please order if you agree. We will not follow, but will remain available to this patient, to nursing, and the medical and/or surgical teams.  Please re-consult if we need to assist further.   Fara Olden, RN-C, WTA-C Wound Treatment Associate  Pt admitted directly to floor mattress replacement, with low  air loss feature ordered.

## 2016-07-29 NOTE — ED Notes (Signed)
Pt given food tray.

## 2016-07-29 NOTE — Progress Notes (Signed)
Pt foley cathter leaking. Foley catheter removed and new catheter placed. New catheter started leaking around the catheter. Balloon was inflated 10 ml. No leakage noted at this time.  Maeve Debord W Shahzad Thomann, RN

## 2016-07-29 NOTE — ED Notes (Signed)
Report given to Abbie, RN-will transport to room 1509

## 2016-07-30 LAB — COMPREHENSIVE METABOLIC PANEL
ALBUMIN: 2.6 g/dL — AB (ref 3.5–5.0)
ALT: 37 U/L (ref 14–54)
AST: 34 U/L (ref 15–41)
Alkaline Phosphatase: 143 U/L — ABNORMAL HIGH (ref 38–126)
Anion gap: 9 (ref 5–15)
BILIRUBIN TOTAL: 0.4 mg/dL (ref 0.3–1.2)
BUN: 5 mg/dL — AB (ref 6–20)
CHLORIDE: 106 mmol/L (ref 101–111)
CO2: 26 mmol/L (ref 22–32)
Calcium: 9.2 mg/dL (ref 8.9–10.3)
Creatinine, Ser: <0.3 mg/dL — ABNORMAL LOW (ref 0.44–1.00)
GLUCOSE: 99 mg/dL (ref 65–99)
POTASSIUM: 4.1 mmol/L (ref 3.5–5.1)
Sodium: 141 mmol/L (ref 135–145)
TOTAL PROTEIN: 6.6 g/dL (ref 6.5–8.1)

## 2016-07-30 LAB — CBC
HEMATOCRIT: 31.2 % — AB (ref 36.0–46.0)
Hemoglobin: 9.4 g/dL — ABNORMAL LOW (ref 12.0–15.0)
MCH: 25.4 pg — ABNORMAL LOW (ref 26.0–34.0)
MCHC: 30.1 g/dL (ref 30.0–36.0)
MCV: 84.3 fL (ref 78.0–100.0)
Platelets: 529 10*3/uL — ABNORMAL HIGH (ref 150–400)
RBC: 3.7 MIL/uL — ABNORMAL LOW (ref 3.87–5.11)
RDW: 15 % (ref 11.5–15.5)
WBC: 8.2 10*3/uL (ref 4.0–10.5)

## 2016-07-30 MED ORDER — ENSURE ENLIVE PO LIQD
237.0000 mL | Freq: Two times a day (BID) | ORAL | Status: DC
  Administered 2016-07-30 – 2016-08-03 (×4): 237 mL via ORAL

## 2016-07-30 NOTE — Progress Notes (Signed)
Pharmacy Antibiotic Note  Deanna Baldwin is a 41 y.o. female admitted on 07/28/2016 with urinary retention and sepsis.  PMH of MS, malnutrition, chronic pressure ulcers, chronic indwelling foley catheter at SNF.  Pharmacy has been consulted for Vancomycin and Zosyn dosing.  07/30/2016 afebrile WBC 8.2 SCr <30, CrCl ~ 55 ml/min (using Scr of 0.8) Lactic acid 0.7  Plan:  Continue Zosyn 3.375g IV Q8H infused over 4hrs.  Continue Vancomycin 750 IV q24h.  Measure Vanc trough at steady state.  Daily SCr  Follow up renal fxn, culture results, and clinical course.   Height: 5\' 9"  (175.3 cm) Weight: 82 lb 0.2 oz (37.2 kg) IBW/kg (Calculated) : 66.2  Temp (24hrs), Avg:99.3 F (37.4 C), Min:98.6 F (37 C), Max:100.5 F (38.1 C)   Recent Labs Lab 07/28/16 1807 07/28/16 2039 07/28/16 2046 07/29/16 0137 07/29/16 0323 07/29/16 0500 07/30/16 0615  WBC  --  9.1  --   --   --  9.2 8.2  CREATININE 0.35*  --   --   --   --  0.33* <0.30*  LATICACIDVEN  --   --  3.53* 1.3 0.7  --   --     CrCl cannot be calculated (This lab value cannot be used to calculate CrCl because it is not a number: <0.30).    No Known Allergies  Antimicrobials this admission: 6/27 Vanc >>  6/27 Zosyn >>   Dose adjustments this admission:   Microbiology results: 6/27 BCx: ordered 6/27 UCx: GNR  Thank you for allowing pharmacy to be a part of this patient's care.  Dolly Rias RPh 07/30/2016, 10:02 AM Pager [phone]

## 2016-07-30 NOTE — Progress Notes (Addendum)
@IPLOG @        PROGRESS NOTE                                                                                                                                                                                                             Patient Demographics:    Deanna Baldwin, is a 41 y.o. female, DOB - [birth year], OMB:[id number]  Admit date - 07/28/2016   Admitting Physician Lily Kocher, MD  Outpatient Primary MD for the patient is [name], DO  LOS - 1  Chief Complaint  Patient presents with  . Urinary Retention    foley replacement       Brief Narrative  Deanna Baldwin is a 41 y.o. woman with a history of MS (diagnosed 18 years ago), spastic paraplegia, chronic sacral decubitus ulcers, and chronic indwelling foley catheter who presented to the ED for foley catheter exchange, but she was found to have relative hypotension with low grade fever to 100.1. Further workup showed she had a UTI, Foley was changed in the ER and she was admitted for further care.   Subjective:   Patient in bed, appears comfortable, denies any headache, no fever, no chest pain or pressure, no shortness of breath , no abdominal pain. No focal weakness.   Assessment  & Plan :     1. Sepsis secondary to UTI from chronic indwelling Foley. Also multiple decubitus ulcers including left gluteal wound which appears unstageable. Foley catheter was changed this admission, till cultures finalize continue present empiric IV antibiotics, clinically she has improved this morning, sepsis physiology seems to have resolved, baseline low blood pressures of systolic ranging between 75 and 90. Wound care team already following and so far wounds look stable and uninfected.  2. Advanced MS with chronic generalized deconditioning and weakness, bedbound status, multiple decubitus ulcers, chronic indwelling Foley, dysphagia. Currently on dysphagia 3 diet, speech therapy consulted, wound care team following, Foley catheter was  changed this admission. Kindly see wound care notes for all wound care details, so far wounds do not look to be infected.   3. Severe protein calorie malnutrition. [place] on protein supplementation and dietitian consulted.    Diet : DIET DYS 3 Room service appropriate? Yes; Fluid consistency: Thin    Family Communication  :  None  Code Status :  Full  Disposition Plan  :  TBD  Consults  :  None  Procedures  :    DVT Prophylaxis  :  Lovenox    Lab Results  Component Value Date  PLT 529 (H) 07/30/2016    Inpatient Medications  Scheduled Meds: . collagenase   Topical Daily  . enoxaparin (LOVENOX) injection  30 mg Subcutaneous Q24H  . feeding supplement (PRO-STAT SUGAR FREE 64)  30 mL Oral TID WC  . ferrous sulfate  325 mg Oral BID WC  . mirtazapine  7.5 mg Oral QHS  . multivitamin with minerals  1 tablet Oral Daily  . polyethylene glycol  17 g Oral Daily  . sodium chloride flush  3 mL Intravenous Q12H  . vitamin C  500 mg Oral BID   Continuous Infusions: . sodium chloride 125 mL/hr at 07/30/16 0100  . piperacillin-tazobactam (ZOSYN)  IV 3.375 g (07/30/16 0535)  . vancomycin Stopped (07/29/16 2331)   PRN Meds:.acetaminophen **OR** acetaminophen, bisacodyl, ondansetron **OR** ondansetron (ZOFRAN) IV  Antibiotics  :    Anti-infectives    Start     Dose/Rate Route Frequency Ordered Stop   07/29/16 2200  vancomycin (VANCOCIN) IVPB 750 mg/150 ml premix     750 mg 150 mL/hr over 60 Minutes Intravenous Every 24 hours 07/28/16 2206     07/29/16 0600  piperacillin-tazobactam (ZOSYN) IVPB 3.375 g     3.375 g 12.5 mL/hr over 240 Minutes Intravenous Every 8 hours 07/28/16 2206     07/28/16 2100  piperacillin-tazobactam (ZOSYN) IVPB 3.375 g     3.375 g 100 mL/hr over 30 Minutes Intravenous  Once 07/28/16 2057 07/28/16 2219   07/28/16 2100  vancomycin (VANCOCIN) IVPB 1000 mg/200 mL premix     1,000 mg 200 mL/hr over 60 Minutes Intravenous  Once 07/28/16 2057 07/28/16 2337          Objective:   Vitals:   07/29/16 1048 07/29/16 1220 07/29/16 2029 07/30/16 0536  BP: 101/62 (!) 92/59 99/64 95/74   Pulse: (!) 110 (!) 115 (!) 129 97  Resp: 18 18    Temp:  98.6 F (37 C) (!) 100.5 F (38.1 C) 98.9 F (37.2 C)  TempSrc:  Oral  Oral  SpO2: 99% 98% 97% 100%  Weight:  37.2 kg (82 lb 0.2 oz)    Height:  5\' 9"  (1.753 m)      Wt Readings from Last 3 Encounters:  07/29/16 37.2 kg (82 lb 0.2 oz)  07/12/16 37.4 kg (82 lb 6.4 oz)  07/10/16 44.2 kg (97 lb 7.1 oz)     Intake/Output Summary (Last 24 hours) at 07/30/16 0905 Last data filed at 07/30/16 0538  Gross per 24 hour  Intake          4060.25 ml  Output              425 ml  Net          3635.25 ml     Physical Exam  Awake Alert, Oriented X 3, No new F.N deficits,Chronic generalized weakness and contractures, extremely weak and cachectic, Normal affect [place].AT,PERRAL Supple Neck,No JVD, No cervical lymphadenopathy appriciated.  Symmetrical Chest wall movement, Good air movement bilaterally, CTAB RRR,No Gallops,Rubs or new Murmurs, No Parasternal Heave +ve B.Sounds, Abd Soft, No tenderness, No organomegaly appriciated, No rebound - guarding or rigidity. Foley catheter in place, No Cyanosis, Clubbing or edema, No new Rash or bruise, multiple decubitus ulcers kindly see wound care note for all details    Data Review:    CBC  Recent Labs Lab 07/28/16 2039 07/29/16 0500 07/30/16 0615  WBC 9.1 9.2 8.2  HGB 11.4* 9.0* 9.4*  HCT 37.7 30.5* 31.2*  PLT 715* 442* 529*  MCV 85.7 84.5 84.3  MCH 25.9* 24.9* 25.4*  MCHC 30.2 29.5* 30.1  RDW 15.1 15.1 15.0  LYMPHSABS 1.8  --   --   MONOABS 0.5  --   --   EOSABS 0.3  --   --   BASOSABS 0.0  --   --     Chemistries   Recent Labs Lab 07/28/16 1807 07/29/16 0500 07/30/16 0615  NA 141 141 141  K 4.4 4.0 4.1  CL 100* 105 106  CO2 29 27 26   GLUCOSE 111* 110* 99  BUN 11 11 5*  CREATININE 0.35* 0.33* <0.30*  CALCIUM 9.7 9.1 9.2  AST  --   --   34  ALT  --   --  37  ALKPHOS  --   --  143*  BILITOT  --   --  0.4   ------------------------------------------------------------------------------------------------------------------ No results for input(s): CHOL, HDL, LDLCALC, TRIG, CHOLHDL, LDLDIRECT in the last 72 hours.  Lab Results  Component Value Date   HGBA1C 5.5 06/24/2016   ------------------------------------------------------------------------------------------------------------------ No results for input(s): TSH, T4TOTAL, T3FREE, THYROIDAB in the last 72 hours.  Invalid input(s): FREET3 ------------------------------------------------------------------------------------------------------------------ No results for input(s): VITAMINB12, FOLATE, FERRITIN, TIBC, IRON, RETICCTPCT in the last 72 hours.  Coagulation profile No results for input(s): INR, PROTIME in the last 168 hours.  No results for input(s): DDIMER in the last 72 hours.  Cardiac Enzymes No results for input(s): CKMB, TROPONINI, MYOGLOBIN in the last 168 hours.  Invalid input(s): CK ------------------------------------------------------------------------------------------------------------------ No results found for: BNP  Micro Results No results found for this or any previous visit (from the past 240 hour(s)).  Radiology Reports  Dg Chest Port 1 View  Result Date: 07/29/2016 CLINICAL DATA:  Sepsis EXAM: PORTABLE CHEST 1 VIEW COMPARISON:  07/07/2016 FINDINGS: A single AP portable view of the chest demonstrates no focal airspace consolidation or alveolar edema. The lungs are grossly clear. There is no large effusion or pneumothorax. Cardiac and mediastinal contours appear unremarkable. IMPRESSION: No active disease. Electronically Signed   By: Andreas Newport M.D.   On: 07/29/2016 01:04    Time Spent in minutes  30   Lala Lund M.D on 07/30/2016 at 9:05 AM  Between 7am to 7pm - Pager - [phone] ( page via [email or web address], text pages only,  please mention full 10 digit call back number). After 7pm go to [email or web address] - password [email or web address]

## 2016-07-30 NOTE — Progress Notes (Signed)
Pt foley catheter leaking. Foley placed 6/28. Balloon was inflated due to leaking. Balloon inflated to 30 ml. Urology consult is in. MD Hospitalist aware.

## 2016-07-30 NOTE — [place] FL2 (Signed)
[place] MEDICAID FL2 LEVEL OF CARE SCREENING TOOL     IDENTIFICATION  Patient Name: Deanna Baldwin Birthdate: 12-31-1975 Sex: female Admission Date (Current Location): 07/28/2016  [facility] and [place] Number:  Herbalist and Address:  [facility],  [place] [address], [place]      Provider Number: [id number]  Attending Physician Name and Address:  Thurnell Lose, MD  Relative Name and Phone Number:       Current Level of Care: Hospital Recommended Level of Care: [place] Prior Approval Number:    Date Approved/Denied:   PASRR Number: [id number]  Discharge Plan: SNF    Current Diagnoses: Patient Active Problem List   Diagnosis Date Noted  . Vitamin B 12 deficiency 07/09/2016  . Sepsis due to urinary tract infection ([place])   . Pressure ulcer, stage 4 ([place]) 07/08/2016  . Foley catheter problem ([place])   . Sepsis ([place]) 07/07/2016  . Unintentional weight loss 07/06/2016  . Relapsing remitting multiple sclerosis ([place]) 06/08/2016  . 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 Neurogenic bladder 04/09/2016  . Foley catheter in place 04/09/2016  . Tachycardia 04/08/2016  . Anemia of chronic disease 04/06/2016  . Dysphagia, oral phase 03/17/2016  . Neutropenia ([place])   . Muscle abscess   . Myofascitis   . Aspiration pneumonia of left lower lobe due to vomit ([place])   . Sacral decubitus ulcer, stage III ([place])   . Multiple sclerosis ([place])   . Protein-calorie malnutrition, severe ([place])   . Hypokalemia 03/05/2016  . Hypernatremia   . Pressure ulcer, stage 3 ([place]) 03/03/2016  . 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 Community acquired pneumonia 03/02/2016  . UTI (urinary tract infection) 03/02/2016  . Abnormal LFTs 03/02/2016    Orientation RESPIRATION BLADDER Height & Weight     Self, Time, Situation, Place  Normal Indwelling catheter Weight: 82 lb 0.2 oz (37.2 kg) Height:  5\' 9"  (175.3 cm)   BEHAVIORAL SYMPTOMS/MOOD NEUROLOGICAL BOWEL NUTRITION STATUS      Continent Diet  AMBULATORY STATUS COMMUNICATION OF NEEDS Skin   Extensive Assist Verbally PU Stage and Appropriate Care, Other (Comment) (unstageable tuberosity, right heal)     PU Stage 3 Dressing: Daily (Left Thigh ) PU Stage 4 Dressing: Daily (Upper and lower Sacrum, left foot)               Personal Care Assistance Level of Assistance  Bathing, Feeding, Dressing Bathing Assistance: Maximum assistance Feeding assistance: Maximum assistance Dressing Assistance: Maximum assistance     Functional Limitations Info  Sight, Hearing, Speech Sight Info: Adequate Hearing Info: Adequate Speech Info: Adequate    SPECIAL CARE FACTORS FREQUENCY                       Contractures Contractures Info: Not present    Additional Factors Info  Code Status, Allergies, Isolation Precautions Code Status Info: Full Allergies Info: No Known Allergies     Isolation Precautions Info: Contact-MRSA     Current Medications (07/30/2016):  This is the current hospital active medication list Current Facility-Administered Medications  Medication Dose Route Frequency Provider Last Rate Last Dose  . 0.9 %  sodium chloride infusion   Intravenous Continuous Lily Kocher, MD 125 mL/hr at 07/30/16 1017    . acetaminophen (TYLENOL) tablet 650 mg  650 mg Oral Q6H PRN Lily Kocher, MD       Or  . acetaminophen (TYLENOL) suppository 650 mg  650 mg Rectal Q6H PRN Lily Kocher, MD      . bisacodyl (DULCOLAX) EC tablet 5 mg  5 mg Oral Daily PRN Lily Kocher, MD      . collagenase (SANTYL) ointment   Topical Daily Thurnell Lose, MD      . enoxaparin (LOVENOX) injection 30 mg  30 mg Subcutaneous Q24H Thurnell Lose, MD   30 mg at 07/29/16 2024  . feeding supplement (PRO-STAT SUGAR FREE 64) liquid 30 mL  30 mL Oral TID WC Lily Kocher, MD   30 mL at 07/30/16 0855  . ferrous sulfate tablet 325 mg  325 mg Oral BID WC Lily Kocher, MD   325 mg at 07/30/16 0855  . mirtazapine (REMERON) tablet 7.5 mg  7.5 mg Oral QHS Thurnell Lose, MD   7.5 mg at 07/29/16 2232  . multivitamin with minerals tablet 1 tablet  1 tablet Oral Daily Lily Kocher, MD   1 tablet at 07/30/16 0855  . ondansetron (ZOFRAN) tablet 4 mg  4 mg Oral Q6H PRN Lily Kocher, MD       Or  . ondansetron [facility]) injection 4 mg  4 mg Intravenous Q6H PRN Lily Kocher, MD      . piperacillin-tazobactam (ZOSYN) IVPB 3.375 g  3.375 g Intravenous Q8H Randa Spike, RPH   Stopped at 07/30/16 0935  . polyethylene glycol (MIRALAX / GLYCOLAX) packet 17 g  17 g Oral Daily Lily Kocher, MD   17 g at 07/30/16 0855  . sodium chloride flush (NS) 0.9 % injection 3 mL  3 mL Intravenous Q12H Lily Kocher, MD   3 mL at 07/30/16 0856  . vancomycin (VANCOCIN) IVPB 750 mg/150 ml premix  750 mg Intravenous Q24H Randa Spike, RPH   Stopped at 07/29/16 2331  . vitamin C (ASCORBIC ACID) tablet 500 mg  500 mg Oral BID Lily Kocher, MD   500 mg at 07/30/16 2505     Discharge Medications: Please see discharge summary for a list of discharge medications.  Relevant Imaging Results:  Relevant Lab Results:   Additional Information SSN: [id number]  Lia Hopping, LCSW

## 2016-07-30 NOTE — Care Management Note (Signed)
Case Management Note  Patient Details  Name: Deanna Baldwin MRN: [id number] Date of Birth: 1976/01/27  Subjective/Objective:  41 y/o f admitted w/UTI. From SNF-[facility]-CSW already following. Readmit-6/6-6/9-UTI.                  Action/Plan:d/c back to SNF   Expected Discharge Date:   (unknown)               Expected Discharge Plan:  [place]  In-House Referral:  Clinical Social Work  Discharge planning Services  CM Consult  Post Acute Care Choice:    Choice offered to:     DME Arranged:    DME Agency:     HH Arranged:    [place] Agency:     Status of Service:  In process, will continue to follow  If discussed at Long Length of Stay Meetings, dates discussed:    Additional Comments:  Dessa Phi, RN 07/30/2016, 2:52 PM

## 2016-07-30 NOTE — Progress Notes (Signed)
A full assessment was completed on patient 07/09/2016, no additional changes since then per patient.  Patient admitted for request for foley catheter exchange.  Plan is for patient to return to SNF[facility] at discharge.  CSW completed FL2,and faxed updated clinical information to facility.   CSW will continue to assist with patient discharge back to SNF.   Kathrin Greathouse, Latanya Presser, MSW Clinical Social Worker 5E and Psychiatric Service Line [phone] 07/30/2016  10:46 AM

## 2016-07-30 NOTE — Progress Notes (Addendum)
Initial Nutrition Assessment  DOCUMENTATION CODES:   Severe malnutrition in context of chronic illness  INTERVENTION:   Ensure Enlive po BID, each supplement provides 350 kcal and 20 grams of protein  Prostat liquid protein PO 30 ml BID with meals, each supplement provides 100 kcal, 15 grams protein.  MVI  Assist with ordering meals  NUTRITION DIAGNOSIS:   Malnutrition (severe) related to chronic illness (MS, paraplegia, multiple wounds), dysphagia as evidenced by severe depletion of muscle mass, severe depletion of body fat, 20 percent weight loss in 4 months.  GOAL:   Patient will meet greater than or equal to 90% of their needs  MONITOR:   PO intake, Supplement acceptance, Labs, Weight trends  REASON FOR ASSESSMENT:   Consult Assessment of nutrition requirement/status  ASSESSMENT:   41 y.o.woman with a history of MS (diagnosed 18 years ago), spastic paraplegia, chronic sacral decubitus ulcers, and chronic indwelling foley catheter who presented to the ED for foley catheter exchange, but she was found to have relative hypotension with low grade fever to 100.1. Further workup showed she had a UTI, Foley was changed in the ER and she was admitted for further care.   Met with pt in room today. Pt is very pleasant and in good spirits at time of RD visit. Pt reports good appetite pta and currently. Pt eating 85-100% of meals and taking two prostat per day. Pt reports that she is on Prostat at the facility where she resides. Pt also reports that she takes a daily MVI and drinks Ensure. Pt has multiple wounds on her sacrum, ischium, and lower legs and feet. Per chart, pt has lost 20lbs(20%) since February; this is severe. Pt reports that this has been the worst medical year of her life. Pt understands the importance of protein intake and has been told this over and over. Pt is willing to take whatever supplements she needs to get better. Pt reports that she can't see very well and  needs help with ordering meals; RD will change pt to needs assist with meals.     Medications reviewed and include: lovenox, ferrous sulfate, mirtazapine, MVI, miralax, Vit C, zosyn, vancomycin  Labs reviewed: BUN 5(L), creat <0.3(L), alb 2.6(L) Hgb 9.4(L), Hct 31.2(L)  Nutrition-Focused physical exam completed. Findings are severe fat and  muscle depletions over entire body, and no edema.   Diet Order:  DIET DYS 3 Room service appropriate? 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  Estimated Nutritional Needs:   Kcal:  1500-1700kcal/day   Protein:  74-82g/day   Fluid:  >1.3L/day   EDUCATION NEEDS:   Education needs addressed  Koleen Distance MS, RD, LDN Pager #[phone] After Hours Pager: [phone]

## 2016-07-31 LAB — URINE CULTURE: Special Requests: NORMAL

## 2016-07-31 LAB — CREATININE, SERUM
Creatinine, Ser: 0.31 mg/dL — ABNORMAL LOW (ref 0.44–1.00)
GFR calc Af Amer: >60 mL/min (ref >60)
GFR calc non Af Amer: >60 mL/min (ref >60)

## 2016-07-31 LAB — VANCOMYCIN, TROUGH: Vancomycin Tr: <4 ug/mL — ABNORMAL LOW (ref 15–20)

## 2016-07-31 MED ORDER — SODIUM CHLORIDE 0.9 % IV BOLUS (SEPSIS)
500.0000 mL | Freq: Once | INTRAVENOUS | Status: AC
  Administered 2016-07-31: 500 mL via INTRAVENOUS

## 2016-07-31 MED ORDER — DEXTROSE 5 % IV SOLN
1.0000 g | INTRAVENOUS | Status: DC
  Administered 2016-07-31 – 2016-08-01 (×2): 1 g via INTRAVENOUS
  Filled 2016-07-31 (×3): qty 10

## 2016-07-31 NOTE — Progress Notes (Signed)
Pharmacy Antibiotic Note  Deanna Baldwin is a 41 y.o. female admitted on 07/28/2016 with urinary retention and sepsis.  PMH of MS, malnutrition, chronic pressure ulcers, chronic indwelling foley catheter at SNF.  Pharmacy has been consulted for Vancomycin and Zosyn dosing. Low weight 44.6kg.  Today, 07/31/2016: - afebrile - WBC 8.2 - SCr <30, CrCl ~ 55 ml/min (using Scr of 0.8) - Lactic acid 0.7, PCT low.   Plan:  Continue Zosyn 3.375g IV Q8H infused over 4hrs.  Continue Vancomycin 750 IV q24h.  Check Vanc trough tonight before 4th dose. Goal 15-20mg /l.  Daily SCr  Follow up renal fxn, culture results, and clinical course.   Height: 5\' 9"  (175.3 cm) Weight: 98 lb 6.4 oz (44.6 kg) IBW/kg (Calculated) : 66.2  Temp (24hrs), Avg:98.7 F (37.1 C), Min:98.6 F (37 C), Max:99 F (37.2 C)   Recent Labs Lab 07/28/16 1807 07/28/16 2039 07/28/16 2046 07/29/16 0137 07/29/16 0323 07/29/16 0500 07/30/16 0615 07/31/16 0823  WBC  --  9.1  --   --   --  9.2 8.2  --   CREATININE 0.35*  --   --   --   --  0.33* <0.30* 0.31*  LATICACIDVEN  --   --  3.53* 1.3 0.7  --   --   --     Estimated Creatinine Clearance: 65.8 mL/min (A) (by C-G formula based on SCr of 0.31 mg/dL (L)).    No Known Allergies  Antimicrobials this admission: 6/27 Vanc >>  6/27 Zosyn >>   Dose adjustments this admission:   Microbiology results: 6/28 BCx: sent 6/27 UCx: Proteus, resis only to nitro  Past admission cxt: 6/6 UCx: >100k Proteus 3/5 UCx: >100k Citrobacter 1/30 UCx > 100k Ecoli  Thank you for allowing pharmacy to be a part of this patient's care.  Romeo Rabon, PharmD, pager [phone]. 07/31/2016,11:45 AM.

## 2016-07-31 NOTE — Progress Notes (Signed)
@IPLOG @        PROGRESS NOTE                                                                                                                                                                                                             Patient Demographics:    Deanna Baldwin, is a 41 y.o. female, DOB - 03-Nov-1975, PJA:[id number]  Admit date - 07/28/2016   Admitting Physician Lily Kocher, MD  Outpatient Primary MD for the patient is [name], DO  LOS - 2  Chief Complaint  Patient presents with  . Urinary Retention    foley replacement       Brief Narrative  Deanna Baldwin is a 41 y.o. woman with a history of MS (diagnosed 18 years ago), spastic paraplegia, chronic sacral decubitus ulcers, and chronic indwelling foley catheter who presented to the ED for foley catheter exchange, but she was found to have relative hypotension with low grade fever to 100.1. Further workup showed she had a UTI, Foley was changed in the ER and she was admitted for further care.   Subjective:   Patient in bed, appears comfortable, denies any headache, no fever, no chest pain or pressure, no shortness of breath , no abdominal pain. No focal weakness.   Assessment  & Plan :     1. Sepsis secondary to UTI from chronic indwelling Foley. Was treated with IV fluids and empiric antibiotics, urine culture and sensitivity data noted will be transitioned to Rocephin for 5 more days on 07/31/2016, note she has chronically low blood pressures at baseline. Currently appears nontoxic and not septic. Sepsis physiology has resolved clinically.  2. Advanced MS with chronic generalized deconditioning and weakness, bedbound status, multiple decubitus ulcers, chronic indwelling Foley, dysphagia. Currently on dysphagia 3 diet, speech therapy consulted, wound care team following, Foley catheter was changed this admission in the ER, catheter is leaking persistently despite bulb being maximally inflated, we'll request urology to  evaluate on 07/31/2016. Kindly see wound care notes for all wound care details, so far wounds do not look to be infected.   3. Severe protein calorie malnutrition. Placed on protein supplementation and dietitian is following.    Diet : DIET DYS 3 Room service appropriate? Yes with Assist; Fluid consistency: Thin    Family Communication  :  None  Code Status :  Full  Disposition Plan  :  TBD  Consults  :  None  Procedures  :    DVT Prophylaxis  :  Lovenox  Lab Results  Component Value Date   PLT 529 (H) 07/30/2016    Inpatient Medications  Scheduled Meds: . collagenase   Topical Daily  . enoxaparin (LOVENOX) injection  30 mg Subcutaneous Q24H  . feeding supplement (ENSURE ENLIVE)  237 mL Oral BID BM  . feeding supplement (PRO-STAT SUGAR FREE 64)  30 mL Oral TID WC  . ferrous sulfate  325 mg Oral BID WC  . mirtazapine  7.5 mg Oral QHS  . multivitamin with minerals  1 tablet Oral Daily  . polyethylene glycol  17 g Oral Daily  . sodium chloride flush  3 mL Intravenous Q12H  . vitamin C  500 mg Oral BID   Continuous Infusions: . sodium chloride 125 mL/hr at 07/31/16 0405  . cefTRIAXone (ROCEPHIN)  IV     PRN Meds:.acetaminophen **OR** acetaminophen, bisacodyl, [DISCONTINUED] ondansetron **OR** ondansetron (ZOFRAN) IV  Antibiotics  :    Anti-infectives    Start     Dose/Rate Route Frequency Ordered Stop   07/31/16 1215  cefTRIAXone (ROCEPHIN) 1 g in dextrose 5 % 50 mL IVPB     1 g 100 mL/hr over 30 Minutes Intravenous Every 24 hours 07/31/16 1202 08/05/16 1214   07/29/16 2200  vancomycin (VANCOCIN) IVPB 750 mg/150 ml premix  Status:  Discontinued     750 mg 150 mL/hr over 60 Minutes Intravenous Every 24 hours 07/28/16 2206 07/31/16 1202   07/29/16 0600  piperacillin-tazobactam (ZOSYN) IVPB 3.375 g  Status:  Discontinued     3.375 g 12.5 mL/hr over 240 Minutes Intravenous Every 8 hours 07/28/16 2206 07/31/16 1202   07/28/16 2100  piperacillin-tazobactam (ZOSYN)  IVPB 3.375 g     3.375 g 100 mL/hr over 30 Minutes Intravenous  Once 07/28/16 2057 07/28/16 2219   07/28/16 2100  vancomycin (VANCOCIN) IVPB 1000 mg/200 mL premix     1,000 mg 200 mL/hr over 60 Minutes Intravenous  Once 07/28/16 2057 07/28/16 2337         Objective:   Vitals:   07/30/16 0536 07/30/16 1400 07/30/16 2056 07/31/16 0500  BP: 95/74 (!) 86/51 92/61 (!) 88/50  Pulse: 97 (!) 120 (!) 114 (!) 105  Resp:  20 19 17   Temp: 98.9 F (37.2 C) 98.6 F (37 C) 99 F (37.2 C) 98.6 F (37 C)  TempSrc: Oral Oral Oral Oral  SpO2: 100% 98% 99% 100%  Weight:    44.6 kg (98 lb 6.4 oz)  Height:        Wt Readings from Last 3 Encounters:  07/31/16 44.6 kg (98 lb 6.4 oz)  07/12/16 37.4 kg (82 lb 6.4 oz)  07/10/16 44.2 kg (97 lb 7.1 oz)     Intake/Output Summary (Last 24 hours) at 07/31/16 1203 Last data filed at 07/31/16 0758  Gross per 24 hour  Intake          3395.43 ml  Output              700 ml  Net          2695.43 ml     Physical Exam  Awake Alert, Oriented X 3, No new F.N deficits, Chronic generalized weakness and contractures, extremely weak and cachectic, [place].AT,PERRAL Supple Neck,No JVD, No cervical lymphadenopathy appriciated.  Symmetrical Chest wall movement, Good air movement bilaterally, CTAB RRR,No Gallops,Rubs or new Murmurs, No Parasternal Heave +ve B.Sounds, Abd Soft, No tenderness, No organomegaly appriciated, No rebound - guarding or rigidity. No Cyanosis, Clubbing or edema, No new Rash or  bruise, multiple decubitus ulcers kindly see wound care note for all details    Data Review:    CBC  Recent Labs Lab 07/28/16 2039 07/29/16 0500 07/30/16 0615  WBC 9.1 9.2 8.2  HGB 11.4* 9.0* 9.4*  HCT 37.7 30.5* 31.2*  PLT 715* 442* 529*  MCV 85.7 84.5 84.3  MCH 25.9* 24.9* 25.4*  MCHC 30.2 29.5* 30.1  RDW 15.1 15.1 15.0  LYMPHSABS 1.8  --   --   MONOABS 0.5  --   --   EOSABS 0.3  --   --   BASOSABS 0.0  --   --     Chemistries   Recent  Labs Lab 07/28/16 1807 07/29/16 0500 07/30/16 0615 07/31/16 0823  NA 141 141 141  --   K 4.4 4.0 4.1  --   CL 100* 105 106  --   CO2 29 27 26   --   GLUCOSE 111* 110* 99  --   BUN 11 11 5*  --   CREATININE 0.35* 0.33* <0.30* 0.31*  CALCIUM 9.7 9.1 9.2  --   AST  --   --  34  --   ALT  --   --  37  --   ALKPHOS  --   --  143*  --   BILITOT  --   --  0.4  --    ------------------------------------------------------------------------------------------------------------------ No results for input(s): CHOL, HDL, LDLCALC, TRIG, CHOLHDL, LDLDIRECT in the last 72 hours.  Lab Results  Component Value Date   HGBA1C 5.5 06/24/2016   ------------------------------------------------------------------------------------------------------------------ No results for input(s): TSH, T4TOTAL, T3FREE, THYROIDAB in the last 72 hours.  Invalid input(s): FREET3 ------------------------------------------------------------------------------------------------------------------ No results for input(s): VITAMINB12, FOLATE, FERRITIN, TIBC, IRON, RETICCTPCT in the last 72 hours.  Coagulation profile No results for input(s): INR, PROTIME in the last 168 hours.  No results for input(s): DDIMER in the last 72 hours.  Cardiac Enzymes No results for input(s): CKMB, TROPONINI, MYOGLOBIN in the last 168 hours.  Invalid input(s): CK ------------------------------------------------------------------------------------------------------------------ No results found for: BNP  Micro Results Recent Results (from the past 240 hour(s))  Urine culture     Status: Abnormal   Collection Time: 07/28/16  9:25 PM  Result Value Ref Range Status   Specimen Description URINE, CLEAN CATCH  Final   Special Requests Normal  Final   Culture >=100,000 COLONIES/mL PROTEUS MIRABILIS (A)  Final   Report Status 07/31/2016 FINAL  Final   Organism ID, Bacteria PROTEUS MIRABILIS (A)  Final      Susceptibility   Proteus mirabilis  - MIC*    AMPICILLIN <=2 SENSITIVE Sensitive     CEFAZOLIN <=4 SENSITIVE Sensitive     CEFTRIAXONE <=1 SENSITIVE Sensitive     CIPROFLOXACIN <=0.25 SENSITIVE Sensitive     GENTAMICIN <=1 SENSITIVE Sensitive     IMIPENEM 2 SENSITIVE Sensitive     NITROFURANTOIN 128 RESISTANT Resistant     TRIMETH/SULFA <=20 SENSITIVE Sensitive     AMPICILLIN/SULBACTAM <=2 SENSITIVE Sensitive     PIP/TAZO <=4 SENSITIVE Sensitive     * >=100,000 COLONIES/mL PROTEUS MIRABILIS    Radiology Reports  Dg Chest Port 1 View  Result Date: 07/29/2016 CLINICAL DATA:  Sepsis EXAM: PORTABLE CHEST 1 VIEW COMPARISON:  07/07/2016 FINDINGS: A single AP portable view of the chest demonstrates no focal airspace consolidation or alveolar edema. The lungs are grossly clear. There is no large effusion or pneumothorax. Cardiac and mediastinal contours appear unremarkable. IMPRESSION: No active disease. Electronically Signed   By:  Andreas Newport M.D.   On: 07/29/2016 01:04    Time Spent in minutes  30   Lala Lund M.D on 07/31/2016 at 12:03 PM  Between 7am to 7pm - Pager - [phone] ( page via [email or web address], text pages only, please mention full 10 digit call back number). After 7pm go to [email or web address] - password [facility]

## 2016-08-01 LAB — CREATININE, SERUM: Creatinine, Ser: <0.3 mg/dL — ABNORMAL LOW (ref 0.44–1.00)

## 2016-08-01 NOTE — Consult Note (Signed)
Subjective: CC: Incontinence.  Hx: Deanna Baldwin is a 41 yo female with severe MS and a neurogenic bladder managed with a chronic foley who I was asked to see in consultation by Dr. Candiss Norse for leakage around her foley.    She was admitted with sepsis and has multiple decubiti.   She has a neurogenic bladder with a chronic foley and was leaking around the catheter despite catheter change in the ER.  She had seen Dr. Louis Meckel in 3/18 for similar issues and on exam was found to have a urethral erosion from the foley.   She failed to return in f/u.   I recommended catheter removal and applications of the new female external catheter last night and that has been working well to keep her dry in the face of generous urine output.   She has no specific complaints at this time.  She grew a proteus in the urine on 07/28/16.  Renal US on 04/05/16 showed mild right hydronephrosis.   ROS:  Review of Systems  All other systems reviewed and are negative.   No Known Allergies  Past Medical History:  Diagnosis Date  . Buttock wound 03/03/2016  . MS (multiple sclerosis) ([place])   . Pneumonia 02/2016  . Protein calorie malnutrition ([place])   . Severe sepsis ([place]) 03/03/2016  . UTI (urinary tract infection) 02/2016    Past Surgical History:  Procedure Laterality Date  . NO PAST SURGERIES      Social History   Social History  . Marital status: Single    Spouse name: N/A  . Number of children: N/A  . Years of education: N/A   Occupational History  . Disabled    Social History Main Topics  . Smoking status: Former Smoker    Packs/day: 1.00    Types: Cigarettes    Quit date: 02/01/2010  . Smokeless tobacco: Never Used  . Alcohol use No  . Drug use: No  . Sexual activity: Not on file   Other Topics Concern  . Not on file   Social History Narrative   She is a resident at [facility].   Right-handed.    Family History  Problem Relation Age of Onset  . Mental illness Sister      Anti-infectives: Anti-infectives    Start     Dose/Rate Route Frequency Ordered Stop   07/31/16 1400  cefTRIAXone (ROCEPHIN) 1 g in dextrose 5 % 50 mL IVPB     1 g 100 mL/hr over 30 Minutes Intravenous Every 24 hours 07/31/16 1202 08/05/16 1359   07/29/16 2200  vancomycin (VANCOCIN) IVPB 750 mg/150 ml premix  Status:  Discontinued     750 mg 150 mL/hr over 60 Minutes Intravenous Every 24 hours 07/28/16 2206 07/31/16 1202   07/29/16 0600  piperacillin-tazobactam (ZOSYN) IVPB 3.375 g  Status:  Discontinued     3.375 g 12.5 mL/hr over 240 Minutes Intravenous Every 8 hours 07/28/16 2206 07/31/16 1202   07/28/16 2100  piperacillin-tazobactam (ZOSYN) IVPB 3.375 g     3.375 g 100 mL/hr over 30 Minutes Intravenous  Once 07/28/16 2057 07/28/16 2219   07/28/16 2100  vancomycin (VANCOCIN) IVPB 1000 mg/200 mL premix     1,000 mg 200 mL/hr over 60 Minutes Intravenous  Once 07/28/16 2057 07/28/16 2337      Current Facility-Administered Medications  Medication Dose Route Frequency Provider Last Rate Last Dose  . acetaminophen (TYLENOL) tablet 650 mg  650 mg Oral Q6H PRN Lily Kocher, MD  650 mg at 07/31/16 0405  . bisacodyl (DULCOLAX) EC tablet 5 mg  5 mg Oral Daily PRN Lily Kocher, MD      . cefTRIAXone (ROCEPHIN) 1 g in dextrose 5 % 50 mL IVPB  1 g Intravenous Q24H Thurnell Lose, MD   Stopped at 07/31/16 1528  . collagenase (SANTYL) ointment   Topical Daily Lala Lund K, MD      . enoxaparin (LOVENOX) injection 30 mg  30 mg Subcutaneous Q24H Thurnell Lose, MD   30 mg at 07/31/16 2300  . feeding supplement (ENSURE ENLIVE) (ENSURE ENLIVE) liquid 237 mL  237 mL Oral BID BM Thurnell Lose, MD   237 mL at 07/31/16 1157  . feeding supplement (PRO-STAT SUGAR FREE 64) liquid 30 mL  30 mL Oral TID WC Lily Kocher, MD   30 mL at 07/31/16 1448  . ferrous sulfate tablet 325 mg  325 mg Oral BID WC Lily Kocher, MD   325 mg at 07/31/16 1729  . mirtazapine (REMERON) tablet 7.5 mg  7.5  mg Oral QHS Thurnell Lose, MD   7.5 mg at 07/31/16 2300  . multivitamin with minerals tablet 1 tablet  1 tablet Oral Daily Lily Kocher, MD   1 tablet at 07/31/16 1156  . ondansetron (ZOFRAN) injection 4 mg  4 mg Intravenous Q6H PRN Lily Kocher, MD      . polyethylene glycol (MIRALAX / GLYCOLAX) packet 17 g  17 g Oral Daily Lily Kocher, MD   17 g at 07/30/16 0855  . sodium chloride flush (NS) 0.9 % injection 3 mL  3 mL Intravenous Q12H Lily Kocher, MD   3 mL at 07/31/16 2200  . vitamin C (ASCORBIC ACID) tablet 500 mg  500 mg Oral BID Lily Kocher, MD   500 mg at 07/31/16 2300     Objective: Vital signs in last 24 hours: Temp:  [98.1 F (36.7 C)-99.5 F (37.5 C)] 98.1 F (36.7 C) (07/01 0506) Pulse Rate:  [84-98] 98 (07/01 0506) Resp:  [16-17] 16 (07/01 0506) BP: (91-104)/(65-87) 91/66 (07/01 0506) SpO2:  [100 %] 100 % (07/01 0506)  Intake/Output from previous day: 06/30 0701 - 07/01 0700 In: 460 [P.O.:460] Out: 1050 [Urine:1050] Intake/Output this shift: No intake/output data recorded.   Physical Exam  Constitutional: She is oriented to person, place, and time.  Thin, BF in NAD with severely contracted lower extremities.   Cardiovascular: Normal rate and regular rhythm.   Pulmonary/Chest: Effort normal. No respiratory distress.  Abdominal: Soft. She exhibits no mass. There is no tenderness.  Genitourinary:  Genitourinary Comments: Severe posterior urethral erosion per Dr. Carlton Adam report.   Musculoskeletal:  Severely contracted lower extremities.    Neurological: She is alert and oriented to person, place, and time.  Skin: Skin is warm and dry.  Stage 3 sacral decubiti per admission exam.   Vitals reviewed.   Lab Results:   Recent Labs  07/30/16 0615  WBC 8.2  HGB 9.4*  HCT 31.2*  PLT 529*   BMET  Recent Labs  07/30/16 0615 07/31/16 0823  NA 141  --   K 4.1  --   CL 106  --   CO2 26  --   GLUCOSE 99  --   BUN 5*  --   CREATININE <0.30*  0.31*  CALCIUM 9.2  --    PT/INR No results for input(s): LABPROT, INR in the last 72 hours. ABG No results for input(s): PHART, HCO3 in the last 72  hours.  Invalid input(s): PCO2, PO2  Studies/Results: No results found.  I have reviewed records from Dr. Louis Meckel from 04/19/16 and have discussed the case with Dr. Candiss Norse.   Labs reviewed.   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    She is currently dry with the external urine collection device but will need further diversion with either a suprapubic tube or ileal conduit.   I will discuss further with Dr. Louis Meckel.   CC: Dr. Lala Lund     Malka So 08/01/2016 [phone]

## 2016-08-01 NOTE — Progress Notes (Signed)
@IPLOG @        PROGRESS NOTE                                                                                                                                                                                                             Patient Demographics:    Deanna Baldwin, is a 41 y.o. female, DOB - 1975-04-20, HBZ:[id number]  Admit date - 07/28/2016   Admitting Physician Lily Kocher, MD  Outpatient Primary MD for the patient is [name], DO  LOS - 3  Chief Complaint  Patient presents with  . Urinary Retention    foley replacement       Brief Narrative  Deanna Baldwin is a 41 y.o. woman with a history of MS (diagnosed 18 years ago), spastic paraplegia, chronic sacral decubitus ulcers, and chronic indwelling foley catheter who presented to the ED for foley catheter exchange, but she was found to have relative hypotension with low grade fever to 100.1. Further workup showed she had a UTI, Foley was changed in the ER and she was admitted for further care.   Subjective:   Patient in bed, appears comfortable, denies any headache, no fever, no chest pain or pressure, no shortness of breath , no abdominal pain. No focal weakness.    Assessment  & Plan :     1. Sepsis secondary to UTI from chronic indwelling Foley. Was treated with IV fluids and empiric antibiotics, urine culture and sensitivity data noted will be transitioned to Rocephin for 5 more days on 07/31/2016, note she has chronically low blood pressures at baseline. Currently appears nontoxic and not septic. Sepsis physiology has resolved clinically.  2. Advanced MS with chronic generalized deconditioning and weakness, bedbound status, multiple decubitus ulcers, chronic indwelling Foley, dysphagia. Currently on dysphagia 3 diet, speech therapy consulted, wound care team following. Kindly see wound care notes for all wound care details, so far wounds do not look to be infected.  Her Foley catheter was persistently leaking  despite multiple changes in bulb being inflated to the max, she was seen by urology, currently Foley catheter was removed on 07/31/2016 and she is currently on female suction device catheter, keeping proper hygiene and is dry. Likely will get suprapubic catheter soon. We will defer timing to urology.   3. Severe protein calorie malnutrition. On protein supplementation and dietitian following.    Diet : DIET DYS 3 Room service appropriate? Yes with Assist; Fluid consistency: Thin    Family Communication  :  None  Code Status :  Full  Disposition Plan  :  TBD  Consults  :  None  Procedures  :    DVT Prophylaxis  :  Lovenox    Lab Results  Component Value Date   PLT 529 (H) 07/30/2016    Inpatient Medications  Scheduled Meds: . collagenase   Topical Daily  . enoxaparin (LOVENOX) injection  30 mg Subcutaneous Q24H  . feeding supplement (ENSURE ENLIVE)  237 mL Oral BID BM  . feeding supplement (PRO-STAT SUGAR FREE 64)  30 mL Oral TID WC  . ferrous sulfate  325 mg Oral BID WC  . mirtazapine  7.5 mg Oral QHS  . multivitamin with minerals  1 tablet Oral Daily  . polyethylene glycol  17 g Oral Daily  . sodium chloride flush  3 mL Intravenous Q12H  . vitamin C  500 mg Oral BID   Continuous Infusions: . cefTRIAXone (ROCEPHIN)  IV Stopped (07/31/16 1528)   PRN Meds:.acetaminophen **OR** [DISCONTINUED] acetaminophen, bisacodyl, [DISCONTINUED] ondansetron **OR** ondansetron (ZOFRAN) IV  Antibiotics  :    Anti-infectives    Start     Dose/Rate Route Frequency Ordered Stop   07/31/16 1400  cefTRIAXone (ROCEPHIN) 1 g in dextrose 5 % 50 mL IVPB     1 g 100 mL/hr over 30 Minutes Intravenous Every 24 hours 07/31/16 1202 08/05/16 1359   07/29/16 2200  vancomycin (VANCOCIN) IVPB 750 mg/150 ml premix  Status:  Discontinued     750 mg 150 mL/hr over 60 Minutes Intravenous Every 24 hours 07/28/16 2206 07/31/16 1202   07/29/16 0600  piperacillin-tazobactam (ZOSYN) IVPB 3.375 g  Status:   Discontinued     3.375 g 12.5 mL/hr over 240 Minutes Intravenous Every 8 hours 07/28/16 2206 07/31/16 1202   07/28/16 2100  piperacillin-tazobactam (ZOSYN) IVPB 3.375 g     3.375 g 100 mL/hr over 30 Minutes Intravenous  Once 07/28/16 2057 07/28/16 2219   07/28/16 2100  vancomycin (VANCOCIN) IVPB 1000 mg/200 mL premix     1,000 mg 200 mL/hr over 60 Minutes Intravenous  Once 07/28/16 2057 07/28/16 2337         Objective:   Vitals:   07/31/16 0500 07/31/16 1308 07/31/16 2025 08/01/16 0506  BP: (!) 88/50 96/65 104/87 91/66  Pulse: (!) 105 97 84 98  Resp: 17 16 17 16   Temp: 98.6 F (37 C) 98.6 F (37 C) 99.5 F (37.5 C) 98.1 F (36.7 C)  TempSrc: Oral Oral Oral Oral  SpO2: 100% 100% 100% 100%  Weight: 44.6 kg (98 lb 6.4 oz)     Height:        Wt Readings from Last 3 Encounters:  07/31/16 44.6 kg (98 lb 6.4 oz)  07/12/16 37.4 kg (82 lb 6.4 oz)  07/10/16 44.2 kg (97 lb 7.1 oz)     Intake/Output Summary (Last 24 hours) at 08/01/16 0917 Last data filed at 07/31/16 2300  Gross per 24 hour  Intake              340 ml  Output             1050 ml  Net             -710 ml     Physical Exam  Awake Alert, Normal affect, Chronic generalized weakness and contractures, extremely weak and cachectic, [place].AT,PERRAL Supple Neck,No JVD, No cervical lymphadenopathy appriciated.  Symmetrical Chest wall movement, Good air movement bilaterally, CTAB RRR,No Gallops,Rubs or new Murmurs, No Parasternal Heave +ve B.Sounds, Abd  Soft, No tenderness, No organomegaly appriciated, No rebound - guarding or rigidity. No Cyanosis, Clubbing or edema,  multiple decubitus ulcers kindly see wound care note for all details    Data Review:    CBC  Recent Labs Lab 07/28/16 2039 07/29/16 0500 07/30/16 0615  WBC 9.1 9.2 8.2  HGB 11.4* 9.0* 9.4*  HCT 37.7 30.5* 31.2*  PLT 715* 442* 529*  MCV 85.7 84.5 84.3  MCH 25.9* 24.9* 25.4*  MCHC 30.2 29.5* 30.1  RDW 15.1 15.1 15.0  LYMPHSABS 1.8  --    --   MONOABS 0.5  --   --   EOSABS 0.3  --   --   BASOSABS 0.0  --   --     Chemistries   Recent Labs Lab 07/28/16 1807 07/29/16 0500 07/30/16 0615 07/31/16 0823 08/01/16 0714  NA 141 141 141  --   --   K 4.4 4.0 4.1  --   --   CL 100* 105 106  --   --   CO2 29 27 26   --   --   GLUCOSE 111* 110* 99  --   --   BUN 11 11 5*  --   --   CREATININE 0.35* 0.33* <0.30* 0.31* <0.30*  CALCIUM 9.7 9.1 9.2  --   --   AST  --   --  34  --   --   ALT  --   --  37  --   --   ALKPHOS  --   --  143*  --   --   BILITOT  --   --  0.4  --   --    ------------------------------------------------------------------------------------------------------------------ No results for input(s): CHOL, HDL, LDLCALC, TRIG, CHOLHDL, LDLDIRECT in the last 72 hours.  Lab Results  Component Value Date   HGBA1C 5.5 06/24/2016   ------------------------------------------------------------------------------------------------------------------ No results for input(s): TSH, T4TOTAL, T3FREE, THYROIDAB in the last 72 hours.  Invalid input(s): FREET3 ------------------------------------------------------------------------------------------------------------------ No results for input(s): VITAMINB12, FOLATE, FERRITIN, TIBC, IRON, RETICCTPCT in the last 72 hours.  Coagulation profile No results for input(s): INR, PROTIME in the last 168 hours.  No results for input(s): DDIMER in the last 72 hours.  Cardiac Enzymes No results for input(s): CKMB, TROPONINI, MYOGLOBIN in the last 168 hours.  Invalid input(s): CK ------------------------------------------------------------------------------------------------------------------ No results found for: BNP  Micro Results Recent Results (from the past 240 hour(s))  Urine culture     Status: Abnormal   Collection Time: 07/28/16  9:25 PM  Result Value Ref Range Status   Specimen Description URINE, CLEAN CATCH  Final   Special Requests Normal  Final   Culture  >=100,000 COLONIES/mL PROTEUS MIRABILIS (A)  Final   Report Status 07/31/2016 FINAL  Final   Organism ID, Bacteria PROTEUS MIRABILIS (A)  Final      Susceptibility   Proteus mirabilis - MIC*    AMPICILLIN <=2 SENSITIVE Sensitive     CEFAZOLIN <=4 SENSITIVE Sensitive     CEFTRIAXONE <=1 SENSITIVE Sensitive     CIPROFLOXACIN <=0.25 SENSITIVE Sensitive     GENTAMICIN <=1 SENSITIVE Sensitive     IMIPENEM 2 SENSITIVE Sensitive     NITROFURANTOIN 128 RESISTANT Resistant     TRIMETH/SULFA <=20 SENSITIVE Sensitive     AMPICILLIN/SULBACTAM <=2 SENSITIVE Sensitive     PIP/TAZO <=4 SENSITIVE Sensitive     * >=100,000 COLONIES/mL PROTEUS MIRABILIS  Culture, blood (x 2)     Status: None (Preliminary result)   Collection Time: 07/29/16  1:37 AM  Result Value Ref Range Status   Specimen Description BLOOD RIGHT ANTECUBITAL  Final   Special Requests IN PEDIATRIC BOTTLE Blood Culture adequate volume  Final   Culture   Final    NO GROWTH 2 DAYS Performed at [facility], [place] [address], [place], [place] [address]    Report Status PENDING  Incomplete  Culture, blood (x 2)     Status: None (Preliminary result)   Collection Time: 07/29/16  1:43 AM  Result Value Ref Range Status   Specimen Description BLOOD LEFT HAND  Final   Special Requests IN PEDIATRIC BOTTLE Blood Culture adequate volume  Final   Culture   Final    NO GROWTH 2 DAYS Performed at [facility], [place] [address] [place], [place] [address]    Report Status PENDING  Incomplete    Radiology Reports  Dg Chest Port 1 View  Result Date: 07/29/2016 CLINICAL DATA:  Sepsis EXAM: PORTABLE CHEST 1 VIEW COMPARISON:  07/07/2016 FINDINGS: A single AP portable view of the chest demonstrates no focal airspace consolidation or alveolar edema. The lungs are grossly clear. There is no large effusion or pneumothorax. Cardiac and mediastinal contours appear unremarkable. IMPRESSION: No active disease. Electronically Signed   By: Andreas Newport M.D.   On: 07/29/2016 01:04    Time Spent in minutes  30   Lala Lund M.D on 08/01/2016 at 9:17 AM  Between 7am to 7pm - Pager - [phone] ( page via [email or web address], text pages only, please mention full 10 digit call back number). After 7pm go to [email or web address] - password [facility]

## 2016-08-01 NOTE — Progress Notes (Signed)
Pt bladder scanned at this time -- residual 18-25cc.

## 2016-08-02 LAB — CREATININE, SERUM: Creatinine, Ser: <0.3 mg/dL — ABNORMAL LOW (ref 0.44–1.00)

## 2016-08-02 MED ORDER — CEFAZOLIN SODIUM-DEXTROSE 1-4 GM/50ML-% IV SOLN
1.0000 g | Freq: Three times a day (TID) | INTRAVENOUS | Status: DC
  Administered 2016-08-02 – 2016-08-03 (×3): 1 g via INTRAVENOUS
  Filled 2016-08-02 (×5): qty 50

## 2016-08-02 NOTE — Consult Note (Signed)
Urology Inpatient Progress Report  Acute cystitis without hematuria [N30.00] Sepsis ([place]) [A41.9] Problem with Foley catheter, subsequent encounter [T83.9XXD] Sepsis, due to unspecified organism ([place]) [A41.9]        Intv/Subj: No acute events overnight. Patient is without complaint.  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 acetaminophen (TYLENOL) tablet 650 mg  650 mg Oral Q6H PRN Lily Kocher, MD   650 mg at 07/31/16 0405  . bisacodyl (DULCOLAX) EC tablet 5 mg  5 mg Oral Daily PRN Lily Kocher, MD      . cefTRIAXone (ROCEPHIN) 1 g in dextrose 5 % 50 mL IVPB  1 g Intravenous Q24H Thurnell Lose, MD   Stopped at 08/01/16 1516  . collagenase (SANTYL) ointment   Topical Daily Lala Lund K, MD      . enoxaparin (LOVENOX) injection 30 mg  30 mg Subcutaneous Q24H Thurnell Lose, MD   30 mg at 08/01/16 2205  . feeding supplement (ENSURE ENLIVE) (ENSURE ENLIVE) liquid 237 mL  237 mL Oral BID BM Thurnell Lose, MD   237 mL at 08/01/16 1128  . feeding supplement (PRO-STAT SUGAR FREE 64) liquid 30 mL  30 mL Oral TID WC Lily Kocher, MD   30 mL at 07/31/16 3875  . ferrous sulfate tablet 325 mg  325 mg Oral BID WC Lily Kocher, MD   325 mg at 08/01/16 1809  . mirtazapine (REMERON) tablet 7.5 mg  7.5 mg Oral QHS Lala Lund K, MD   7.5 mg at 08/01/16 2200  . multivitamin with minerals tablet 1 tablet  1 tablet Oral Daily Lily Kocher, MD   1 tablet at 08/01/16 1128  . ondansetron (ZOFRAN) injection 4 mg  4 mg Intravenous Q6H PRN Lily Kocher, MD      . polyethylene glycol (MIRALAX /  GLYCOLAX) packet 17 g  17 g Oral Daily Lily Kocher, MD   17 g at 07/30/16 0855  . sodium chloride flush (NS) 0.9 % injection 3 mL  3 mL Intravenous Q12H Lily Kocher, MD   3 mL at 08/01/16 1130  . vitamin C (ASCORBIC ACID) tablet 500 mg  500 mg Oral BID Lily Kocher, MD   500 mg at 08/01/16 2205     Objective: Vital: Vitals:   08/01/16 0506 08/01/16 1316 08/01/16 2152 08/02/16 0500  BP: 91/66 91/61 91/60  94/70  Pulse: 98 (!) 105 (!) 113 (!) 101  Resp: 16 16 16 18   Temp: 98.1 F (36.7 C) 98.3 F (36.8 C) 98.2 F (36.8 C) 97.5 F (36.4 C)  TempSrc: Oral Oral Oral Oral  SpO2: 100% 100% 100% 100%  Weight:  41.5 kg (91 lb 9.6 oz)    Height:       I/Os: I/O last 3 completed shifts: In: 560 [P.O.:560] Out: 1150 [Urine:1150]  Physical Exam:  General: Patient is in no apparent distress   Lab Results: No results for input(s): WBC, HGB, HCT in the last 72 hours.  Recent Labs  07/31/16 0823 08/01/16 0714 08/02/16 0545  CREATININE 0.31* <0.30* <0.30*   No results for input(s): LABPT, INR in the last 72 hours.  No results for input(s): LABURIN in the last 72 hours. Results for orders placed or performed during the hospital encounter of 07/28/16  Urine culture     Status: Abnormal   Collection Time: 07/28/16  9:25 PM  Result Value Ref Range Status   Specimen Description URINE, CLEAN CATCH  Final   Special Requests Normal  Final   Culture >=100,000 COLONIES/mL PROTEUS MIRABILIS (A)  Final   Report Status 07/31/2016 FINAL  Final   Organism ID, Bacteria PROTEUS MIRABILIS (A)  Final      Susceptibility   Proteus mirabilis - MIC*    AMPICILLIN <=2 SENSITIVE Sensitive     CEFAZOLIN <=4 SENSITIVE Sensitive     CEFTRIAXONE <=1 SENSITIVE Sensitive     CIPROFLOXACIN <=0.25 SENSITIVE Sensitive     GENTAMICIN <=1 SENSITIVE Sensitive     IMIPENEM 2 SENSITIVE Sensitive     NITROFURANTOIN 128 RESISTANT Resistant     TRIMETH/SULFA <=20 SENSITIVE Sensitive     AMPICILLIN/SULBACTAM  <=2 SENSITIVE Sensitive     PIP/TAZO <=4 SENSITIVE Sensitive     * >=100,000 COLONIES/mL PROTEUS MIRABILIS  Culture, blood (x 2)     Status: None (Preliminary result)   Collection Time: 07/29/16  1:37 AM  Result Value Ref Range Status   Specimen Description BLOOD RIGHT ANTECUBITAL  Final   Special Requests IN PEDIATRIC BOTTLE Blood Culture adequate volume  Final   Culture   Final    NO GROWTH 3 DAYS Performed at [facility], [place] [address], [place], [place] [address]    Report Status PENDING  Incomplete  Culture, blood (x 2)     Status: None (Preliminary result)   Collection Time: 07/29/16  1:43 AM  Result Value Ref Range Status   Specimen Description BLOOD LEFT HAND  Final   Special Requests IN PEDIATRIC BOTTLE Blood Culture adequate volume  Final   Culture   Final    NO GROWTH 3 DAYS Performed at [facility], [place] [address] [place], [place] [address]    Report Status PENDING  Incomplete    Studies/Results: No results found.  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  Erosion through sphincter to bladder neck.  Admitted with sacral-decubiti and UTI.  SP tube won't work for this patient.  She will continue to leak through her open bladder neck.  She needs a urinary diversion.  This will take time to schedule.  Recommend that she be treated for her current infection and return to nursing facility with diapers.  I will schedule her to be seen in clinic in 2-3 weeks and schedule her bigger urinary diversion surgery in 4 weeks.    Louis Meckel, MD Urology 08/02/2016, 8:25 AM

## 2016-08-02 NOTE — Progress Notes (Signed)
@IPLOG @        PROGRESS NOTE                                                                                                                                                                                                             Patient Demographics:    Deanna Baldwin, is a 41 y.o. female, DOB - 1975/02/22, HYI:[id number]  Admit date - 07/28/2016   Admitting Physician Lily Kocher, MD  Outpatient Primary MD for the patient is [name], DO  LOS - 4  Chief Complaint  Patient presents with  . Urinary Retention    foley replacement       Brief Narrative  Deanna Baldwin is a 41 y.o. woman with a history of MS (diagnosed 18 years ago), spastic paraplegia, chronic sacral decubitus ulcers, and chronic indwelling foley catheter who presented to the ED for foley catheter exchange, but she was found to have relative hypotension with low grade fever to 100.1. Further workup showed she had a UTI, Foley was changed in the ER and she was admitted for further care.   Subjective:   Patient in bed, appears comfortable, denies any headache, no fever, no chest pain or pressure, no shortness of breath , no abdominal pain. No focal weakness.   Assessment  & Plan :     1. Sepsis secondary to UTI from chronic indwelling Foley. Was treated with IV fluids and empiric antibiotics, urine culture and sensitivity data noted will be transitioned to Rocephin for 5 more days on 07/31/2016, note she has chronically low blood pressures at baseline. Currently appears nontoxic and not septic. Sepsis physiology has resolved clinically.  2. Advanced MS with chronic generalized deconditioning and weakness, bedbound status, multiple decubitus ulcers, chronic indwelling Foley, dysphagia.   For her dysphagia she is on D3 diet, therapy following, also wound care team following for chronic decubitus ulcers, currently see wound care note for all details.  She had chronic problem with leaking Foley catheters, seen by  urology, currently on female suction device catheter, plan is to place suprapubic catheter per urology.   3. Severe protein calorie malnutrition. Continue protein supplementation, dietitian onboard.    Diet : Diet NPO time specified    Family Communication  :  None  Code Status :  Full  Disposition Plan  :  TBD  Consults  :  None  Procedures  :    DVT Prophylaxis  :  Lovenox    Lab Results  Component Value Date   PLT 529 (H) 07/30/2016  Inpatient Medications  Scheduled Meds: . collagenase   Topical Daily  . enoxaparin (LOVENOX) injection  30 mg Subcutaneous Q24H  . feeding supplement (ENSURE ENLIVE)  237 mL Oral BID BM  . feeding supplement (PRO-STAT SUGAR FREE 64)  30 mL Oral TID WC  . ferrous sulfate  325 mg Oral BID WC  . mirtazapine  7.5 mg Oral QHS  . multivitamin with minerals  1 tablet Oral Daily  . polyethylene glycol  17 g Oral Daily  . sodium chloride flush  3 mL Intravenous Q12H  . vitamin C  500 mg Oral BID   Continuous Infusions: . cefTRIAXone (ROCEPHIN)  IV Stopped (08/01/16 1516)   PRN Meds:.acetaminophen **OR** [DISCONTINUED] acetaminophen, bisacodyl, [DISCONTINUED] ondansetron **OR** ondansetron (ZOFRAN) IV  Antibiotics  :    Anti-infectives    Start     Dose/Rate Route Frequency Ordered Stop   07/31/16 1400  cefTRIAXone (ROCEPHIN) 1 g in dextrose 5 % 50 mL IVPB     1 g 100 mL/hr over 30 Minutes Intravenous Every 24 hours 07/31/16 1202 08/05/16 1359   07/29/16 2200  vancomycin (VANCOCIN) IVPB 750 mg/150 ml premix  Status:  Discontinued     750 mg 150 mL/hr over 60 Minutes Intravenous Every 24 hours 07/28/16 2206 07/31/16 1202   07/29/16 0600  piperacillin-tazobactam (ZOSYN) IVPB 3.375 g  Status:  Discontinued     3.375 g 12.5 mL/hr over 240 Minutes Intravenous Every 8 hours 07/28/16 2206 07/31/16 1202   07/28/16 2100  piperacillin-tazobactam (ZOSYN) IVPB 3.375 g     3.375 g 100 mL/hr over 30 Minutes Intravenous  Once 07/28/16 2057  07/28/16 2219   07/28/16 2100  vancomycin (VANCOCIN) IVPB 1000 mg/200 mL premix     1,000 mg 200 mL/hr over 60 Minutes Intravenous  Once 07/28/16 2057 07/28/16 2337         Objective:   Vitals:   08/01/16 0506 08/01/16 1316 08/01/16 2152 08/02/16 0500  BP: 91/66 91/61 91/60  94/70  Pulse: 98 (!) 105 (!) 113 (!) 101  Resp: 16 16 16 18   Temp: 98.1 F (36.7 C) 98.3 F (36.8 C) 98.2 F (36.8 C) 97.5 F (36.4 C)  TempSrc: Oral Oral Oral Oral  SpO2: 100% 100% 100% 100%  Weight:  41.5 kg (91 lb 9.6 oz)    Height:        Wt Readings from Last 3 Encounters:  08/01/16 41.5 kg (91 lb 9.6 oz)  07/12/16 37.4 kg (82 lb 6.4 oz)  07/10/16 44.2 kg (97 lb 7.1 oz)     Intake/Output Summary (Last 24 hours) at 08/02/16 1015 Last data filed at 08/02/16 0500  Gross per 24 hour  Intake              340 ml  Output             1150 ml  Net             -810 ml     Physical Exam  Awake Alert, Oriented X 3, Chronic generalized weakness and contractures, extremely weak and cachectic,   [place].AT,PERRAL Supple Neck,No JVD, No cervical lymphadenopathy appriciated.  Symmetrical Chest wall movement, Good air movement bilaterally, CTAB RRR,No Gallops,Rubs or new Murmurs, No Parasternal Heave +ve B.Sounds, Abd Soft, No tenderness, No organomegaly appriciated, No rebound - guarding or rigidity. No Cyanosis, Clubbing or edema,  Multiple decubitus ulcers kindly see wound care note for all details    Data Review:    CBC  Recent Labs  Lab 07/28/16 2039 07/29/16 0500 07/30/16 0615  WBC 9.1 9.2 8.2  HGB 11.4* 9.0* 9.4*  HCT 37.7 30.5* 31.2*  PLT 715* 442* 529*  MCV 85.7 84.5 84.3  MCH 25.9* 24.9* 25.4*  MCHC 30.2 29.5* 30.1  RDW 15.1 15.1 15.0  LYMPHSABS 1.8  --   --   MONOABS 0.5  --   --   EOSABS 0.3  --   --   BASOSABS 0.0  --   --     Chemistries   Recent Labs Lab 07/28/16 1807 07/29/16 0500 07/30/16 0615 07/31/16 0823 08/01/16 0714 08/02/16 0545  NA 141 141 141  --   --    --   K 4.4 4.0 4.1  --   --   --   CL 100* 105 106  --   --   --   CO2 29 27 26   --   --   --   GLUCOSE 111* 110* 99  --   --   --   BUN 11 11 5*  --   --   --   CREATININE 0.35* 0.33* <0.30* 0.31* <0.30* <0.30*  CALCIUM 9.7 9.1 9.2  --   --   --   AST  --   --  34  --   --   --   ALT  --   --  37  --   --   --   ALKPHOS  --   --  143*  --   --   --   BILITOT  --   --  0.4  --   --   --    ------------------------------------------------------------------------------------------------------------------ No results for input(s): CHOL, HDL, LDLCALC, TRIG, CHOLHDL, LDLDIRECT in the last 72 hours.  Lab Results  Component Value Date   HGBA1C 5.5 06/24/2016   ------------------------------------------------------------------------------------------------------------------ No results for input(s): TSH, T4TOTAL, T3FREE, THYROIDAB in the last 72 hours.  Invalid input(s): FREET3 ------------------------------------------------------------------------------------------------------------------ No results for input(s): VITAMINB12, FOLATE, FERRITIN, TIBC, IRON, RETICCTPCT in the last 72 hours.  Coagulation profile No results for input(s): INR, PROTIME in the last 168 hours.  No results for input(s): DDIMER in the last 72 hours.  Cardiac Enzymes No results for input(s): CKMB, TROPONINI, MYOGLOBIN in the last 168 hours.  Invalid input(s): CK ------------------------------------------------------------------------------------------------------------------ No results found for: BNP  Micro Results Recent Results (from the past 240 hour(s))  Urine culture     Status: Abnormal   Collection Time: 07/28/16  9:25 PM  Result Value Ref Range Status   Specimen Description URINE, CLEAN CATCH  Final   Special Requests Normal  Final   Culture >=100,000 COLONIES/mL PROTEUS MIRABILIS (A)  Final   Report Status 07/31/2016 FINAL  Final   Organism ID, Bacteria PROTEUS MIRABILIS (A)  Final       Susceptibility   Proteus mirabilis - MIC*    AMPICILLIN <=2 SENSITIVE Sensitive     CEFAZOLIN <=4 SENSITIVE Sensitive     CEFTRIAXONE <=1 SENSITIVE Sensitive     CIPROFLOXACIN <=0.25 SENSITIVE Sensitive     GENTAMICIN <=1 SENSITIVE Sensitive     IMIPENEM 2 SENSITIVE Sensitive     NITROFURANTOIN 128 RESISTANT Resistant     TRIMETH/SULFA <=20 SENSITIVE Sensitive     AMPICILLIN/SULBACTAM <=2 SENSITIVE Sensitive     PIP/TAZO <=4 SENSITIVE Sensitive     * >=100,000 COLONIES/mL PROTEUS MIRABILIS  Culture, blood (x 2)     Status: None (Preliminary result)   Collection Time: 07/29/16  1:37 AM  Result  Value Ref Range Status   Specimen Description BLOOD RIGHT ANTECUBITAL  Final   Special Requests IN PEDIATRIC BOTTLE Blood Culture adequate volume  Final   Culture   Final    NO GROWTH 3 DAYS Performed at [facility], [place] [address], [place], [place] [address]    Report Status PENDING  Incomplete  Culture, blood (x 2)     Status: None (Preliminary result)   Collection Time: 07/29/16  1:43 AM  Result Value Ref Range Status   Specimen Description BLOOD LEFT HAND  Final   Special Requests IN PEDIATRIC BOTTLE Blood Culture adequate volume  Final   Culture   Final    NO GROWTH 3 DAYS Performed at [facility], [place] [address], [place], [place] [address]    Report Status PENDING  Incomplete    Radiology Reports  Dg Chest Port 1 View  Result Date: 07/29/2016 CLINICAL DATA:  Sepsis EXAM: PORTABLE CHEST 1 VIEW COMPARISON:  07/07/2016 FINDINGS: A single AP portable view of the chest demonstrates no focal airspace consolidation or alveolar edema. The lungs are grossly clear. There is no large effusion or pneumothorax. Cardiac and mediastinal contours appear unremarkable. IMPRESSION: No active disease. Electronically Signed   By: Andreas Newport M.D.   On: 07/29/2016 01:04    Time Spent in minutes  30   Lala Lund M.D on 08/02/2016 at 10:15 AM  Between 7am to 7pm - Pager -  [phone] ( page via [email or web address], text pages only, please mention full 10 digit call back number). After 7pm go to [email or web address] - password [email or web address]

## 2016-08-02 NOTE — Care Management Important Message (Addendum)
Important Message  Patient Details IM Letter given to [name]/Case Manager to present to Patient Name: Deanna Baldwin MRN: [id number] Date of Birth: December 30, 1975   Medicare Important Message Given:  Yes    [name] 08/02/2016, 2:08 [name] Message  Patient Details  Name: Deanna Baldwin MRN: [id number] Date of Birth: Jun 19, 1975   Medicare Important Message Given:  Yes    [name] 08/02/2016, 2:07 PM

## 2016-08-03 DIAGNOSIS — D649 Anemia, unspecified: Secondary | ICD-10-CM | POA: Diagnosis not present

## 2016-08-03 DIAGNOSIS — R000 Tachycardia, unspecified: Secondary | ICD-10-CM | POA: Diagnosis not present

## 2016-08-03 DIAGNOSIS — N390 Urinary tract infection, site not specified: Secondary | ICD-10-CM | POA: Diagnosis not present

## 2016-08-03 DIAGNOSIS — A419 Sepsis, unspecified organism: Secondary | ICD-10-CM | POA: Diagnosis not present

## 2016-08-03 DIAGNOSIS — R05 Cough: Secondary | ICD-10-CM | POA: Diagnosis not present

## 2016-08-03 DIAGNOSIS — Z802 Family history of malignant neoplasm of other respiratory and intrathoracic organs: Secondary | ICD-10-CM | POA: Diagnosis not present

## 2016-08-03 DIAGNOSIS — K567 Ileus, unspecified: Secondary | ICD-10-CM | POA: Diagnosis not present

## 2016-08-03 DIAGNOSIS — Z9289 Personal history of other medical treatment: Secondary | ICD-10-CM | POA: Diagnosis not present

## 2016-08-03 DIAGNOSIS — K7689 Other specified diseases of liver: Secondary | ICD-10-CM | POA: Diagnosis not present

## 2016-08-03 DIAGNOSIS — Z818 Family history of other mental and behavioral disorders: Secondary | ICD-10-CM | POA: Diagnosis not present

## 2016-08-03 DIAGNOSIS — S31809A Unspecified open wound of unspecified buttock, initial encounter: Secondary | ICD-10-CM | POA: Diagnosis not present

## 2016-08-03 DIAGNOSIS — K6812 Psoas muscle abscess: Secondary | ICD-10-CM | POA: Diagnosis not present

## 2016-08-03 DIAGNOSIS — Z87891 Personal history of nicotine dependence: Secondary | ICD-10-CM | POA: Diagnosis not present

## 2016-08-03 DIAGNOSIS — R6520 Severe sepsis without septic shock: Secondary | ICD-10-CM | POA: Diagnosis not present

## 2016-08-03 DIAGNOSIS — D509 Iron deficiency anemia, unspecified: Secondary | ICD-10-CM | POA: Diagnosis not present

## 2016-08-03 DIAGNOSIS — G35 Multiple sclerosis: Secondary | ICD-10-CM | POA: Diagnosis not present

## 2016-08-03 DIAGNOSIS — R1312 Dysphagia, oropharyngeal phase: Secondary | ICD-10-CM | POA: Diagnosis not present

## 2016-08-03 DIAGNOSIS — L89009 Pressure ulcer of unspecified elbow, unspecified stage: Secondary | ICD-10-CM | POA: Diagnosis not present

## 2016-08-03 DIAGNOSIS — R634 Abnormal weight loss: Secondary | ICD-10-CM | POA: Diagnosis not present

## 2016-08-03 DIAGNOSIS — M6281 Muscle weakness (generalized): Secondary | ICD-10-CM | POA: Diagnosis not present

## 2016-08-03 DIAGNOSIS — N311 Reflex neuropathic bladder, not elsewhere classified: Secondary | ICD-10-CM | POA: Diagnosis not present

## 2016-08-03 DIAGNOSIS — R509 Fever, unspecified: Secondary | ICD-10-CM | POA: Diagnosis not present

## 2016-08-03 DIAGNOSIS — N3946 Mixed incontinence: Secondary | ICD-10-CM | POA: Diagnosis present

## 2016-08-03 DIAGNOSIS — D638 Anemia in other chronic diseases classified elsewhere: Secondary | ICD-10-CM | POA: Diagnosis not present

## 2016-08-03 DIAGNOSIS — I9589 Other hypotension: Secondary | ICD-10-CM | POA: Diagnosis not present

## 2016-08-03 DIAGNOSIS — L8992 Pressure ulcer of unspecified site, stage 2: Secondary | ICD-10-CM | POA: Diagnosis not present

## 2016-08-03 DIAGNOSIS — Z0000 Encounter for general adult medical examination without abnormal findings: Secondary | ICD-10-CM | POA: Diagnosis not present

## 2016-08-03 DIAGNOSIS — E43 Unspecified severe protein-calorie malnutrition: Secondary | ICD-10-CM | POA: Diagnosis not present

## 2016-08-03 DIAGNOSIS — N319 Neuromuscular dysfunction of bladder, unspecified: Secondary | ICD-10-CM | POA: Diagnosis not present

## 2016-08-03 DIAGNOSIS — M791 Myalgia: Secondary | ICD-10-CM | POA: Diagnosis not present

## 2016-08-03 DIAGNOSIS — Z8249 Family history of ischemic heart disease and other diseases of the circulatory system: Secondary | ICD-10-CM | POA: Diagnosis not present

## 2016-08-03 DIAGNOSIS — G114 Hereditary spastic paraplegia: Secondary | ICD-10-CM | POA: Diagnosis not present

## 2016-08-03 DIAGNOSIS — K9189 Other postprocedural complications and disorders of digestive system: Secondary | ICD-10-CM | POA: Diagnosis not present

## 2016-08-03 DIAGNOSIS — F329 Major depressive disorder, single episode, unspecified: Secondary | ICD-10-CM | POA: Diagnosis not present

## 2016-08-03 DIAGNOSIS — E876 Hypokalemia: Secondary | ICD-10-CM | POA: Diagnosis not present

## 2016-08-03 DIAGNOSIS — Z833 Family history of diabetes mellitus: Secondary | ICD-10-CM | POA: Diagnosis not present

## 2016-08-03 DIAGNOSIS — G8220 Paraplegia, unspecified: Secondary | ICD-10-CM | POA: Diagnosis not present

## 2016-08-03 DIAGNOSIS — L89154 Pressure ulcer of sacral region, stage 4: Secondary | ICD-10-CM | POA: Diagnosis not present

## 2016-08-03 DIAGNOSIS — I10 Essential (primary) hypertension: Secondary | ICD-10-CM | POA: Diagnosis present

## 2016-08-03 DIAGNOSIS — Z7401 Bed confinement status: Secondary | ICD-10-CM | POA: Diagnosis not present

## 2016-08-03 DIAGNOSIS — J189 Pneumonia, unspecified organism: Secondary | ICD-10-CM | POA: Diagnosis present

## 2016-08-03 DIAGNOSIS — K560 Paralytic ileus: Secondary | ICD-10-CM | POA: Diagnosis not present

## 2016-08-03 DIAGNOSIS — L89309 Pressure ulcer of unspecified buttock, unspecified stage: Secondary | ICD-10-CM | POA: Diagnosis not present

## 2016-08-03 DIAGNOSIS — R630 Anorexia: Secondary | ICD-10-CM | POA: Diagnosis not present

## 2016-08-03 DIAGNOSIS — N368 Other specified disorders of urethra: Secondary | ICD-10-CM | POA: Diagnosis not present

## 2016-08-03 DIAGNOSIS — R140 Abdominal distension (gaseous): Secondary | ICD-10-CM | POA: Diagnosis not present

## 2016-08-03 DIAGNOSIS — L89613 Pressure ulcer of right heel, stage 3: Secondary | ICD-10-CM | POA: Diagnosis not present

## 2016-08-03 DIAGNOSIS — E870 Hyperosmolality and hypernatremia: Secondary | ICD-10-CM | POA: Diagnosis not present

## 2016-08-03 DIAGNOSIS — L89610 Pressure ulcer of right heel, unstageable: Secondary | ICD-10-CM | POA: Diagnosis not present

## 2016-08-03 DIAGNOSIS — R945 Abnormal results of liver function studies: Secondary | ICD-10-CM | POA: Diagnosis not present

## 2016-08-03 DIAGNOSIS — L89144 Pressure ulcer of left lower back, stage 4: Secondary | ICD-10-CM | POA: Diagnosis not present

## 2016-08-03 DIAGNOSIS — I959 Hypotension, unspecified: Secondary | ICD-10-CM | POA: Diagnosis not present

## 2016-08-03 DIAGNOSIS — B9562 Methicillin resistant Staphylococcus aureus infection as the cause of diseases classified elsewhere: Secondary | ICD-10-CM | POA: Diagnosis present

## 2016-08-03 DIAGNOSIS — J9811 Atelectasis: Secondary | ICD-10-CM | POA: Diagnosis present

## 2016-08-03 DIAGNOSIS — L89153 Pressure ulcer of sacral region, stage 3: Secondary | ICD-10-CM | POA: Diagnosis present

## 2016-08-03 DIAGNOSIS — Z681 Body mass index (BMI) 19 or less, adult: Secondary | ICD-10-CM | POA: Diagnosis not present

## 2016-08-03 DIAGNOSIS — N312 Flaccid neuropathic bladder, not elsewhere classified: Secondary | ICD-10-CM | POA: Diagnosis not present

## 2016-08-03 DIAGNOSIS — F4320 Adjustment disorder, unspecified: Secondary | ICD-10-CM | POA: Diagnosis not present

## 2016-08-03 DIAGNOSIS — N3941 Urge incontinence: Secondary | ICD-10-CM | POA: Diagnosis not present

## 2016-08-03 DIAGNOSIS — L89894 Pressure ulcer of other site, stage 4: Secondary | ICD-10-CM | POA: Diagnosis not present

## 2016-08-03 LAB — CULTURE, BLOOD (ROUTINE X 2)
CULTURE: NO GROWTH
Culture: NO GROWTH
SPECIAL REQUESTS: ADEQUATE
SPECIAL REQUESTS: ADEQUATE

## 2016-08-03 LAB — CREATININE, SERUM
Creatinine, Ser: 0.33 mg/dL — ABNORMAL LOW (ref 0.44–1.00)
GFR calc Af Amer: >60 mL/min (ref >60)
GFR calc non Af Amer: >60 mL/min (ref >60)

## 2016-08-03 MED ORDER — CEPHALEXIN 500 MG PO CAPS: 500.0000 mg | ORAL_CAPSULE | Freq: Three times a day (TID) | ORAL | 0 refills | Status: AC

## 2016-08-03 NOTE — Progress Notes (Signed)
Report called to Art gallery manager at [facility]. Pts IV removed and belongings in room. Pt denies pain at this time.

## 2016-08-03 NOTE — Progress Notes (Addendum)
Patient will return to [facility].  Facility can not manage suction caterer device therefore patient will use diapers per physician.   CSW confirmed with Sharyn Lull at [facility], they are ready for patient at this time. CSW provided number for report.  D/C Summary faxed.  [facility] called for transport.   Kathrin Greathouse, Latanya Presser, MSW Clinical Social Worker 5E and Psychiatric Service Line [phone] 08/03/2016  10:45 AM

## 2016-08-03 NOTE — Discharge Instructions (Signed)
Follow with Primary MD [name], DO in 7 days   Get CBC, CMP, 2 view Chest X ray checked  by Primary MD or SNF MD in 5-7 days ( we routinely change or add medications that can affect your baseline labs and fluid status, therefore we recommend that you get the mentioned basic workup next visit with your PCP, your PCP may decide not to get them or add new tests based on their clinical decision)  Activity: As tolerated with Full fall precautions use walker/cane & assistance as needed  Disposition SNF    Diet:   DIET DYS 3  with feeding assistance and aspiration precautions.  For Heart failure patients - Check your Weight same time everyday, if you gain over 2 pounds, or you develop in leg swelling, experience more shortness of breath or chest pain, call your Primary MD immediately. Follow Cardiac Low Salt Diet and 1.5 lit/day fluid restriction.  On your next visit with your primary care physician please Get Medicines reviewed and adjusted.  Please request your Prim.MD to go over all Hospital Tests and Procedure/Radiological results at the follow up, please get all Hospital records sent to your Prim MD by signing hospital release before you go home.  If you experience worsening of your admission symptoms, develop shortness of breath, life threatening emergency, suicidal or homicidal thoughts you must seek medical attention immediately by calling 911 or calling your MD immediately  if symptoms less severe.  You Must read complete instructions/literature along with all the possible adverse reactions/side effects for all the Medicines you take and that have been prescribed to you. Take any new Medicines after you have completely understood and accpet all the possible adverse reactions/side effects.   Do not drive, operate heavy machinery, perform activities at heights, swimming or participation in water activities or provide baby sitting services if your were admitted for syncope or siezures until  you have seen by Primary MD or a Neurologist and advised to do so again.  Do not drive when taking Pain medications.    Do not take more than prescribed Pain, Sleep and Anxiety Medications  Special Instructions: If you have smoked or chewed Tobacco  in the last 2 yrs please stop smoking, stop any regular Alcohol  and or any Recreational drug use.  Wear Seat belts while driving.   Please note  You were cared for by a hospitalist during your hospital stay. If you have any questions about your discharge medications or the care you received while you were in the hospital after you are discharged, you can call the unit and asked to speak with the hospitalist on call if the hospitalist that took care of you is not available. Once you are discharged, your primary care physician will handle any further medical issues. Please note that NO REFILLS for any discharge medications will be authorized once you are discharged, as it is imperative that you return to your primary care physician (or establish a relationship with a primary care physician if you do not have one) for your aftercare needs so that they can reassess your need for medications and monitor your lab values.

## 2016-08-03 NOTE — Discharge Summary (Signed)
Adelee Baldwin ZTI:[id number] DOB: 06-23-1975 DOA: 07/28/2016  PCP: [name], DO  Admit date: 07/28/2016  Discharge date: 08/03/2016  Admitted From: SNF   Disposition:  SNF   Recommendations for Outpatient Follow-up:   Follow up with PCP in 1-2 weeks  PCP Please obtain BMP/CBC, 2 view CXR in 1week,  (see Discharge instructions)   PCP Please follow up on the following pending results: Must follow with urologist in 1-2 weeks   Home Health: None   Equipment/Devices: None  Consultations: Urology Discharge Condition: Air   CODE STATUS: Full Diet Recommendation: DIET DYS 3    Chief Complaint  Patient presents with  . Urinary Retention    foley replacement     Brief history of present illness from the day of admission and additional interim summary    Deanna Baldwin a 41 y.o.woman with a history of MS (diagnosed 18 years ago), spastic paraplegia, chronic sacral decubitus ulcers, and chronic indwelling foley catheter who presented to the ED for foley catheter exchange, but she was found to have relative hypotension with low grade fever to 100.1. Further workup showed she had a UTI, Foley was changed in the ER and she was admitted for further care.                                                                 Hospital Course    1. Sepsis secondary to UTI from chronic indwelling Foley. Was treated with IV fluids and empiric antibiotics, urine culture and sensitivity data noted And was transitioned to cefazolin and subsequently now to Keflex for 3 more days from 08/03/2016, note she has chronically low blood pressures and sinus tachycardia at baseline. Currently appears nontoxic and not septic. Sepsis physiology has resolved clinically.  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 Louis Meckel, her Foley catheter was removed, plan is to leave her off Foley catheter for a few weeks and then try for a diverting urostomy. SNF can try for the female suction catheter device if available or else they should use diapers for now. Monitor skin breakdown.  2. Advanced MS with chronic generalized deconditioning and weakness, bedbound status, multiple decubitus ulcers, chronic indwelling Foley, dysphagia.   For her dysphagia she is on D3 diet, therapy following, also wound care team following for chronic decubitus ulcers, currently see wound care note for all details.   3. Severe protein calorie malnutrition. Continue protein supplementation.      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 ([place])   UTI (urinary tract infection)   Pressure ulcer, stage 3 (HCC)   Multiple sclerosis (HCC)  Protein-calorie malnutrition, severe ([place])   Foley catheter in place   Sepsis [facility])    Discharge instructions    Discharge Instructions    Diet - low sodium heart healthy    Complete by:  As directed    Discharge instructions    Complete by:  As directed    Follow with Primary MD [name], DO in 7 days   Get CBC, CMP, 2 view Chest X ray checked  by Primary MD or SNF MD in 5-7 days ( we routinely change or add medications that can affect your baseline labs and fluid status, therefore we recommend that you get the mentioned basic workup next visit with your PCP, your PCP may decide not to get them or add new tests based on their clinical decision)  Activity: As tolerated with Full fall precautions use walker/cane & assistance as needed  Disposition SNF    Diet:   DIET DYS 3  with feeding assistance and aspiration precautions.  For Heart failure patients - Check your Weight same time everyday, if you gain over 2 pounds,  or you develop in leg swelling, experience more shortness of breath or chest pain, call your Primary MD immediately. Follow Cardiac Low Salt Diet and 1.5 lit/day fluid restriction.  On your next visit with your primary care physician please Get Medicines reviewed and adjusted.  Please request your Prim.MD to go over all Hospital Tests and Procedure/Radiological results at the follow up, please get all Hospital records sent to your Prim MD by signing hospital release before you go home.  If you experience worsening of your admission symptoms, develop shortness of breath, life threatening emergency, suicidal or homicidal thoughts you must seek medical attention immediately by calling 911 or calling your MD immediately  if symptoms less severe.  You Must read complete instructions/literature along with all the possible adverse reactions/side effects for all the Medicines you take and that have been prescribed to you. Take any new Medicines after you have completely understood and accpet all the possible adverse reactions/side effects.   Do not drive, operate heavy machinery, perform activities at heights, swimming or participation in water activities or provide baby sitting services if your were admitted for syncope or siezures until you have seen by Primary MD or a Neurologist and advised to do so again.  Do not drive when taking Pain medications.    Do not take more than prescribed Pain, Sleep and Anxiety Medications  Special Instructions: If you have smoked or chewed Tobacco  in the last 2 yrs please stop smoking, stop any regular Alcohol  and or any Recreational drug use.  Wear Seat belts while driving.   Please note  You were cared for by a hospitalist during your hospital stay. If you have any questions about your discharge medications or the care you received while you were in the hospital after you are discharged, you can call the unit and asked to speak with the hospitalist on call if  the hospitalist that took care of you is not available. Once you are discharged, your primary care physician will handle any further medical issues. Please note that NO REFILLS for any discharge medications will be authorized once you are discharged, as it is imperative that you return to your primary care physician (or establish a relationship with a primary care physician if you do not have one) for your aftercare needs so that they can reassess your need for medications and monitor your lab values.  Increase activity slowly    Complete by:  As directed       Discharge Medications   Allergies as of 08/03/2016   No Known Allergies     Medication List    TAKE these medications   acetaminophen 325 MG tablet Commonly known as:  TYLENOL Take 650 mg by mouth daily. Pt also takes two tablets every six hours as needed for pain.   baclofen 10 MG tablet Commonly known as:  LIORESAL Take 10 mg by mouth 3 (three) times daily.   bisacodyl 5 MG EC tablet Commonly known as:  DULCOLAX Take 5 mg by mouth daily.   cephALEXin 500 MG capsule Commonly known as:  KEFLEX Take 1 capsule (500 mg total) by mouth 3 (three) times daily.   collagenase ointment Commonly known as:  SANTYL Apply topically daily. What changed:  how much to take   DECUBI-VITE Caps Take 1 capsule by mouth daily.   feeding supplement (PRO-STAT SUGAR FREE 64) Liqd Take 30 mLs by mouth 3 (three) times daily with meals.   ferrous sulfate 325 (65 FE) MG tablet Take 1 tablet (325 mg total) by mouth 2 (two) times daily with a meal.   mirtazapine 15 MG tablet Commonly known as:  REMERON Take 7.5 mg by mouth at bedtime.   NUTRITIONAL SUPPLEMENT PO Take 120 mLs by mouth 3 (three) times daily. Med Pass   polyethylene glycol packet Commonly known as:  MIRALAX / GLYCOLAX Take 17 g by mouth daily.   vitamin B-12 1000 MCG tablet Commonly known as:  CYANOCOBALAMIN Take 1,000 mcg by mouth daily.   vitamin C 500 MG  tablet Commonly known as:  ASCORBIC ACID Take 500 mg by mouth 2 (two) times daily.            Durable Medical Equipment        Start     Ordered   08/03/16 [phone]  For home use only DME Other see comment  Once    Comments:  Female suction catheter supplies, 1 month supply   08/03/16 0834       Contact information for follow-up providers    [name], DO. Schedule an appointment as soon as possible for a visit in 1 week(s).   Specialty:  Internal Medicine Contact information: [place] [address] [phone]        Ardis Hughs, MD. Schedule an appointment as soon as possible for a visit in 1 week(s).   Specialty:  Urology Contact information: [place] [address] [phone]            Contact information for after-discharge care    Destination    [facility] Follow up.   Specialty:  [place] information: [address] [place] [phone]                  Major procedures and Radiology Reports - PLEASE review detailed and final reports thoroughly  -         Dg Abd 1 View  Result Date: 07/10/2016 CLINICAL DATA:  Urinary tract infection EXAM: ABDOMEN - 1 VIEW COMPARISON:  CT abdomen and pelvis Jun 25, 2008 FINDINGS: There is diffuse stool throughout the colon. There is a paucity of small bowel gas. There is no bowel dilatation or air-fluid level to suggest bowel obstruction. No free air. There are scattered probable vascular calcifications in the abdomen. Lung bases  are clear. IMPRESSION: Diffuse stool throughout colon. Paucity of small bowel gas may be seen normally but also may be seen with early ileus or enteritis. Lung bases clear. No evident free air. Electronically Signed   By: Lowella Grip III M.D.   On: 07/10/2016 12:05   Mr Brain Wo Contrast  Result Date: 07/15/2016  [facility] [address] [place], [place] [address] [phone] NEUROIMAGING REPORT STUDY DATE: 07/15/2016 PATIENT NAME: Deanna Baldwin DOB: 1976/01/21 MRN: [id number] EXAM: MRI Brain without contrast ORDERING CLINICIAN: Marcial Pacas M.D. PhD CLINICAL HISTORY: 41 year old woman with multiple sclerosis COMPARISON FILMS: None available TECHNIQUE: MRI of the brain without contrast was obtained utilizing 5 mm axial slices with T1, T2, T2 flair, SWI and diffusion weighted views.  T1 sagittal and T2 coronal views were obtained. CONTRAST: none IMAGING SITE: [facility], [place], [place] FINDINGS: On sagittal images, the spinal cord is imaged caudally to C3-C4 and is normal in caliber.   The contents of the posterior fossa are of normal size and position.   The pituitary gland and optic chiasm appear normal.    There is moderate cortical atrophy. Atrophy is also noted in the corpus callosum and the brainstem. The ventricles are enlarged, in proportion to the extent of atrophy. There are no abnormal extra-axial collections of fluid.  There are T2/FLAIR hyperintense foci noted within the left posterior medulla, right medulla, bilateral inferior pons, right greater than left mid pons and right cerebral peduncle. Additionally, there are T2 hyperintense foci in the left greater than right middle cerebellar peduncles and a few small foci in the cerebellum. In the hemispheres, there are extensive T2/FLAIR hyperintense foci in the periventricular, juxtacortical and deep white matter bilaterally. There are a couple punctate hyperintense foci in the thalamus. The basal ganglia appears normal. None of the foci appeared to be acute or enhance.  Diffusion weighted images are normal.  Susceptibility weighted images are normal.  The orbits appear normal.   The VIIth/VIIIth nerve complex appears normal.  The mastoid air cells appear normal.  The paranasal sinuses appear normal.  Flow voids are identified within the major intracerebral  arteries.      This MRI of the brain with and without contrast shows the following: 1.   Multiple infratentorial and hemispheric T2/FLAIR hyperintense foci consistent with chronic demyelinating plaque associated with multiple sclerosis. None of the foci appeared to be acute. 2.   Moderate cortical atrophy and corpus callosum atrophy with mild brainstem atrophy. 3.   There are no acute findings. INTERPRETING PHYSICIAN: Richard A. Felecia Shelling, MD, PhD, FAAN Certified in  Neuroimaging by [employer]   Mr Cervical Spine Wo Contrast  Result Date: 07/15/2016  [facility] [address] [place], [place] [address] [phone] NEUROIMAGING REPORT STUDY DATE: 07/15/2016 PATIENT NAME: Trixy Loyola DOB: Jan 09, 1976 MRN: [id number] EXAM: MRI of the cervical spine without contrast ORDERING CLINICIAN: Marcial Pacas M.D. 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 In addition 4 mm axial slices from B0-9 down to T1-2 level were included with T2 and gradient echo views. CONTRAST: None (no IV access) IMAGING SITE: [facility], [address] [place], [place], [place] FINDINGS: :  Movement artifact is noted on many of his sequences.  On sagittal images, the  spine is imaged from above the cervicomedullary junction to T2.   The spinal cord is slightly reduced in caliber.   There is patchy T2 hyperintense signal changes noted in the spinal cord more focal lesions noted posteriorly adjacent to C2, posteriorly more to the left adjacent to C3-C4, posteriorly slightly more to the right adjacent to C4-C5, to the right adjacent to C5, anteriorly adjacent to C6-C7 and posteriorly adjacent to T1..   The vertebral bodies are normally aligned.   The vertebral bodies  have normal signal.  The discs and interspaces were further evaluated on axial views from C2 to T1.   There are minimal disc bulges at C3-C4, C4-C5 and C5-C6 but do not lead to any spinal stenosis or foraminal narrowing. There is no nerve root compression    This MRI of the cervical spine without contrast shows the following: 1.   Multiple T2 hyperintense foci within the spinal cord as detailed above.   IV access was not obtainable for contrast. 2.   Mild disc bulges at C3-C4, C4-C5 and C5-C6 but do not lead to any nerve root compression or central canal narrowing. INTERPRETING PHYSICIAN: Richard A. Felecia Shelling, MD, PhD, FAAN Certified in  Neuroimaging by [place] [employer]   Dg Chest [place] 1 View  Result Date: 07/29/2016 CLINICAL DATA:  Sepsis EXAM: PORTABLE CHEST 1 VIEW COMPARISON:  07/07/2016 FINDINGS: A single AP portable view of the chest demonstrates no focal airspace consolidation or alveolar edema. The lungs are grossly clear. There is no large effusion or pneumothorax. Cardiac and mediastinal contours appear unremarkable. IMPRESSION: No active disease. Electronically Signed   By: Andreas Newport M.D.   On: 07/29/2016 01:04   Dg Chest Port 1 View  Result Date: 07/07/2016 CLINICAL DATA:  41 year old with current history of multiple sclerosis, presenting with acute onset of fever. Personal history of pneumonia earlier this year. EXAM: PORTABLE CHEST 1 VIEW COMPARISON:  CT chest 04/07/2016. Chest x-rays 04/06/2016, 03/07/2016 and earlier. FINDINGS: Patient is slightly rotated to the left. Cardiomediastinal silhouette unremarkable, unchanged. Lungs clear. Bronchovascular markings normal. Pulmonary vascularity normal. No visible pleural effusions. No pneumothorax. IMPRESSION: No acute cardiopulmonary disease. Electronically Signed   By: Evangeline Dakin M.D.   On: 07/07/2016 15:51    Micro Results    Recent Results (from the past 240 hour(s))  Urine culture     Status: Abnormal    Collection Time: 07/28/16  9:25 PM  Result Value Ref Range Status   Specimen Description URINE, CLEAN CATCH  Final   Special Requests Normal  Final   Culture >=100,000 COLONIES/mL PROTEUS MIRABILIS (A)  Final   Report Status 07/31/2016 FINAL  Final   Organism ID, Bacteria PROTEUS MIRABILIS (A)  Final      Susceptibility   Proteus mirabilis - MIC*    AMPICILLIN <=2 SENSITIVE Sensitive     CEFAZOLIN <=4 SENSITIVE Sensitive     CEFTRIAXONE <=1 SENSITIVE Sensitive     CIPROFLOXACIN <=0.25 SENSITIVE Sensitive     GENTAMICIN <=1 SENSITIVE Sensitive     IMIPENEM 2 SENSITIVE Sensitive     NITROFURANTOIN 128 RESISTANT Resistant     TRIMETH/SULFA <=20 SENSITIVE Sensitive     AMPICILLIN/SULBACTAM <=2 SENSITIVE Sensitive     PIP/TAZO <=4 SENSITIVE Sensitive     * >=100,000 COLONIES/mL PROTEUS MIRABILIS  Culture, blood (x 2)     Status: None (Preliminary result)   Collection Time: 07/29/16  1:37 AM  Result Value Ref Range Status   Specimen Description BLOOD RIGHT  ANTECUBITAL  Final   Special Requests IN PEDIATRIC BOTTLE Blood Culture adequate volume  Final   Culture   Final    NO GROWTH 4 DAYS Performed at [facility], [place] [address] [place], [place] [address]    Report Status PENDING  Incomplete  Culture, blood (x 2)     Status: None (Preliminary result)   Collection Time: 07/29/16  1:43 AM  Result Value Ref Range Status   Specimen Description BLOOD LEFT HAND  Final   Special Requests IN PEDIATRIC BOTTLE Blood Culture adequate volume  Final   Culture   Final    NO GROWTH 4 DAYS Performed at [facility], [place] [address], [place], [place] [address]    Report Status PENDING  Incomplete    Today   Subjective    Wilberta Dorvil today has no headache,no chest abdominal pain,no new weakness tingling or numbness, feels much better    Objective   Blood pressure (!) 87/60, pulse 103, temperature 97.8 F (36.6 C), temperature source Oral, resp. rate 14, height 5\' 9"  (1.753  m), weight 41.5 kg (91 lb 9.6 oz), last menstrual period 01/28/2016, SpO2 100 %.   Intake/Output Summary (Last 24 hours) at 08/03/16 1022 Last data filed at 08/03/16 0600  Gross per 24 hour  Intake              934 ml  Output              900 ml  Net               34 ml    Exam  Awake Alert, Oriented X 3, Chronic generalized weakness and contractures, extremely weak and cachectic,   [place].AT,PERRAL Supple Neck,No JVD, No cervical lymphadenopathy appriciated.  Symmetrical Chest wall movement, Good air movement bilaterally, CTAB RRR,No Gallops,Rubs or new Murmurs, No Parasternal Heave +ve B.Sounds, Abd Soft, No tenderness, No organomegaly appriciated, No rebound - guarding or rigidity. No Cyanosis, Clubbing or edema,  Multiple decubitus ulcers kindly see wound care note for all details    Data Review   CBC w Diff: Lab Results  Component Value Date   WBC 8.2 07/30/2016   HGB 9.4 (L) 07/30/2016   HGB 9.7 (L) 06/24/2016   HCT 31.2 (L) 07/30/2016   HCT 30.9 (L) 06/24/2016   PLT 529 (H) 07/30/2016   LYMPHOPCT 19 07/28/2016   BANDSPCT 10 03/15/2016   MONOPCT 5 07/28/2016   EOSPCT 4 07/28/2016   BASOPCT 0 07/28/2016    CMP: Lab Results  Component Value Date   NA 141 07/30/2016   NA 138 07/19/2016   K 4.1 07/30/2016   CL 106 07/30/2016   CO2 26 07/30/2016   BUN 5 (L) 07/30/2016   BUN 8 07/19/2016   CREATININE 0.33 (L) 08/03/2016   GLU 140 07/19/2016   PROT 6.6 07/30/2016   ALBUMIN 2.6 (L) 07/30/2016   BILITOT 0.4 07/30/2016   ALKPHOS 143 (H) 07/30/2016   AST 34 07/30/2016   ALT 37 07/30/2016  .   Total Time in preparing paper work, data evaluation and todays exam - 51 minutes  Lala Lund M.D on 08/03/2016 at 10:22 AM  [facility]   Office  [phone]

## 2016-08-03 NOTE — Care Management Note (Signed)
Case Management Note  Patient Details  Name: Deanna Baldwin MRN: [id number] Date of Birth: 1975/10/22  Subjective/Objective:    MS, Sepsis d/t UTI, chronic indwelling cath, mult decub ulcers                Action/Plan: Discharge Planning: Chart reviewed. Scheduled dc to SNF. CSW following for SNF placement.   PCP [name] MD   Expected Discharge Date:  08/03/16               Expected Discharge Plan:  [place]  In-House Referral:  Clinical Social Work  Discharge planning Services  CM Consult  Post Acute Care Choice:  NA Choice offered to:  NA  DME Arranged:  N/A DME Agency:  NA  HH Arranged:  NA HH Agency:  NA  Status of Service:  Completed, signed off  If discussed at [place] of Stay Meetings, dates discussed:    Additional Comments:  Erenest Rasher, RN 08/03/2016, 10:33 AM

## 2016-08-05 ENCOUNTER — Encounter: Payer: Self-pay | Admitting: Adult Health

## 2016-08-05 ENCOUNTER — Non-Acute Institutional Stay (SKILLED_NURSING_FACILITY): Payer: Medicare Other | Admitting: Adult Health

## 2016-08-05 DIAGNOSIS — A419 Sepsis, unspecified organism: Secondary | ICD-10-CM

## 2016-08-05 DIAGNOSIS — N319 Neuromuscular dysfunction of bladder, unspecified: Secondary | ICD-10-CM | POA: Diagnosis not present

## 2016-08-05 DIAGNOSIS — R634 Abnormal weight loss: Secondary | ICD-10-CM | POA: Diagnosis not present

## 2016-08-05 DIAGNOSIS — N390 Urinary tract infection, site not specified: Secondary | ICD-10-CM

## 2016-08-05 DIAGNOSIS — D638 Anemia in other chronic diseases classified elsewhere: Secondary | ICD-10-CM

## 2016-08-05 DIAGNOSIS — E43 Unspecified severe protein-calorie malnutrition: Secondary | ICD-10-CM

## 2016-08-05 DIAGNOSIS — G35 Multiple sclerosis: Secondary | ICD-10-CM | POA: Diagnosis not present

## 2016-08-05 DIAGNOSIS — G8220 Paraplegia, unspecified: Secondary | ICD-10-CM | POA: Diagnosis not present

## 2016-08-05 DIAGNOSIS — L89154 Pressure ulcer of sacral region, stage 4: Secondary | ICD-10-CM | POA: Diagnosis not present

## 2016-08-05 NOTE — Progress Notes (Signed)
Entered in error

## 2016-08-05 NOTE — Progress Notes (Signed)
Location:   [facility] Room Number: 215 B Place of Service:  SNF (31)   CODE STATUS: Full Code  No Known Allergies  Chief Complaint  Patient presents with  . Hospitalization Follow-up    Hospital Follow up    HPI:  She has been hospitalized for sepsis related to UTI from foley cath. She has bladder neck erosion; will keep her foley out at this time; in the future will attempt diverting urostomy. She tells me that she is feeling pretty good. There are no nursing concerns at this time.    Past Medical History:  Diagnosis Date  . Buttock wound 03/03/2016  . MS (multiple sclerosis) ([place])   . Pneumonia 02/2016  . Protein calorie malnutrition ([place])   . Severe sepsis ([place]) 03/03/2016  . UTI (urinary tract infection) 02/2016    Past Surgical History:  Procedure Laterality Date  . NO PAST SURGERIES      Social History   Social History  . Marital status: Single    Spouse name: N/A  . Number of children: N/A  . Years of education: N/A   Occupational History  . Disabled    Social History Main Topics  . Smoking status: Former Smoker    Packs/day: 1.00    Types: Cigarettes    Quit date: 02/01/2010  . Smokeless tobacco: Never Used  . Alcohol use No  . Drug use: No  . Sexual activity: Not on file   Other Topics Concern  . Not on file   Social History Narrative   She is a resident at [facility].   Right-handed.   Family History  Problem Relation Age of Onset  . Mental illness Sister       VITAL SIGNS BP (!) 94/52   Pulse (!) 104   Temp 99.1 F (37.3 C)   Resp 18   Ht 5' 9" (1.753 m)   Wt 83 lb 1.6 oz (37.7 kg)   LMP 01/28/2016   SpO2 98%   BMI 12.27 kg/m   Patient's Medications  New Prescriptions   No medications on file  Previous Medications   ACETAMINOPHEN (TYLENOL) 325 MG TABLET    Take 650 mg by mouth every 6 (six) hours as needed for mild pain.    AMINO ACIDS-PROTEIN HYDROLYS (FEEDING SUPPLEMENT, PRO-STAT SUGAR FREE  64,) LIQD    Take 30 mLs by mouth 3 (three) times daily with meals.   BACLOFEN (LIORESAL) 10 MG TABLET    Take 10 mg by mouth 3 (three) times daily.   BISACODYL (DULCOLAX) 5 MG EC TABLET    Take 5 mg by mouth daily.   CEPHALEXIN (KEFLEX) 500 MG CAPSULE    Take 1 capsule (500 mg total) by mouth 3 (three) times daily.   COLLAGENASE (SANTYL) OINTMENT    Apply to left ischium topically every day shift for wound healing   FERROUS SULFATE 325 (65 FE) MG TABLET    Take 1 tablet (325 mg total) by mouth 2 (two) times daily with a meal.   MIRTAZAPINE (REMERON) 15 MG TABLET    Take 7.5 mg by mouth at bedtime.    MULTIPLE VITAMINS-MINERALS (DECUBI-VITE) CAPS    Take 1 capsule by mouth daily.   NUTRITIONAL SUPPLEMENTS (NUTRITIONAL SUPPLEMENT PO)    Take 120 mLs by mouth 3 (three) times daily. [employer]   POLYETHYLENE GLYCOL (MIRALAX / GLYCOLAX) PACKET    Take 17 g by mouth daily.   VITAMIN B-12 (CYANOCOBALAMIN) 1000 MCG TABLET  Take 1,000 mcg by mouth daily.   VITAMIN C (ASCORBIC ACID) 500 MG TABLET    Take 500 mg by mouth 2 (two) times daily.  Modified Medications   No medications on file  Discontinued Medications   COLLAGENASE (SANTYL) OINTMENT    Apply topically daily.     SIGNIFICANT DIAGNOSTIC EXAMS  03-04-16: swallow study: recommend diet of dysphagia 3 solids, honey thick liquids (no straw), meds whole in puree, and cough/clear throat after every few bites/sips  03-07-16: chest x-ray: Increase left lung opacity concerning for worsening pneumonia or atelectasis with associated pleural effusion.   03-07-16: mri of pelvis: 1. Cellulitis and diffuse myofasciitis involving the pelvic and hip musculature. There also or rim enhancing fluid collections in the gluteus maximus muscles bilaterally concerning for pyomyositis. 2. No findings to suggest septic arthritis or osteomyelitis. 3. Shallow sacral decubitus ulcer but no underlying sacral osteomyelitis. 4. Markedly distended bladder.  03-07-16: mri of  lumbar: 1. Negative for lumbar spine infection or impingement. 2. There is non organized edema in the subcutaneous fat. Negative for abscess. 3. Urinary retention causing bilateral hydroureteronephrosis, also seen by CT in 2010.   03-12-16: bone marrow biopsy: Successful CT guided left iliac bone marrow aspiration and core biopsy. Note, an additional sample was set aside for Gram stain and culture analysis.  04-05-16: transvaginal and pelvic ultrasound: 1. Nonvisualization of the left ovary   2. Otherwise negative pelvic ultrasound  04-05-16: renal ultrasound: Mild right hydronephrosis.  04-05-16: chest x-ray: No evidence of pneumonia.  04-07-16: ct angio of chest: 1. No acute pulmonary embolus. 2. Tiny tree-in-bud densities in the right upper and lower lobes consistent with bronchiolitis. 3. 5 mm or less pulmonary nodular densities are also seen within the right upper and lower lobes possibly representing subpleural and perifissural lymph nodes. No follow-up needed if patient is low-risk (and has no known or suspected primary neoplasm). Non-contrast chest CT can be considered in 12 months if patient is high-risk.    06-03-16: TEE: - The patient was in sinus tachycardia. Normal LV size with EF 45-50%, diffuse hypokinesis. Normal RV size with mildly decreased systolic function. No significant valvular abnormalities.  07-15-16: mri of brain: This MRI of the brain with and without contrast shows the following: 1.   Multiple infratentorial and hemispheric T2/FLAIR hyperintense foci consistent with chronic demyelinating plaque associated with multiple sclerosis. None of the foci appeared to be acute. 2.   Moderate cortical atrophy and corpus callosum atrophy with mild brainstem atrophy. 3.   There are no acute findings.  07-15-16: mri cervical spine: This MRI of the cervical spine without contrast shows the following: 1.   Multiple T2 hyperintense foci within the spinal cord as detailed above.   IV access was  not obtainable for contrast. 2.   Mild disc bulges at C3-C4, C4-C5 and C5-C6 but do not lead to any nerve root compression or central canal narrowing.       LABS REVIEWED:   03-02-16: wbc 13.2; hgb 11.7; hct 37.8; mcv 92.0; plt 526; glucose 163; bun 12; creat 0.77; k+ 3.7; na++ 141; ast 113; alt 55; alk phos 118; total bili 1.7; albumin 2.2  03-05-16: wbc 2.9; hgb 8.8; hct 29.4; mcv 93.3; plt 360; glucose 110; bun 13; creat 0.87; k+ 3.5 ;na++ 158; mag 2.3 03-07-16: wbc 1.8; hgb 9.3; hct 31.0; mcv 92.3; plt 299; glucose 112; bun 9; creat 0.99; k+ 3.2 ;na++ 143; mag 2.0; urine culture: e-coli; blood cultures: neg; HIV nr 03-12-16: wbc  2.6; hgb 8.7; hct 29.0; mcv 89.8; plt 392; glucose 104; bun <5; creat 0.47; k+ 3.6; na++ 141 03-15-16: wbc 24.2; hgb 8.7; hct 28.9; mcv 90.6; plt 496; glucose 88; bun 6; creat 0.41; k+ 3.9; na++ 144 mag 1.8  03-19-16: wbc 12.3; hgb 10.7; hct 35.8; mcv 91.7; plt 513; glucose 112; bun 14.2; creat 0.40; k+ 4.7; na++149; alt 64; ast 73; albumin 3.2  04-05-16: wbc 10.9; hgb 10.3; hct 33.0; mcv 89.7; plt 632; glucose 92; bun 10; creat 0.58; k+ 3.7; na++ 141; mag 1.7; urine culture: citrobacter freundii:cipro  04-06-16: wbc 9.0; hgb 8.2; hct 26.7; mcv 91.1; plt 532; glucose 89; bun 9; creat 0.41; k+ 3.2; na++ 143; liver normal albumin 2.1 04-07-16: wbc 7.9 ;hgb 7.0; hct 22.8; mcv 90.5; plt 517;glucose 82; bun 6; creat 0.38; k+ 4.2; na++ 142; iron 14; tibc 174; ferritin 276; folate 10.1; vit B 12: 423; tsh 1.741; d-dimer 3.67  04-08-16: wbc 6.3; hgb 8.4; hct 27.1; mcv 84.4 ;plt 526; glucose 87; bun 6; creat 0.34; k+ 3.8; na++ 139  05-12-16: wbc 9.8; hgb 10.3; hct 32.6; mcv 90.7; plt 497; glucose 92; bun 11.2; creat 0.54; k+ 4.5; na++ 140; liver normal albumin 4.0;  06-23-16: wbc 12.1; hgb 9.4; hct 31.2; mcv 87.3; plt 629; glucose 150; bun 11.6; creat 0.22; k+ 3.9; na++ 139 alt 63; ast 44; alk phos 219; albumin 3.0; vit B 12: 681; folic acid 27.5; pre-albumin 9; tsh 2.28  07-28-16: wbc 9.1; hgb  11.4; hct 37.7; mcv 85.9; plt 715; glucose 111; bun 11; creat 0.35; k+ 4.4; na++ 141; ca 9.7 07-30-16: wbc 8.2; hgb 9.4; hct 31.2; mcv 84.3; plt 529; glucose 99; bun 5; creat <0.3; k+ 4.1; na++ 141; ca 9.1; alk phos 143; albumin 2.6     Review of Systems  Constitutional: Negative for malaise/fatigue.  Respiratory: Negative for cough and shortness of breath.   Cardiovascular: Negative for chest pain, palpitations and leg swelling.  Gastrointestinal: Negative for abdominal pain, constipation and heartburn.  Genitourinary:       Has foley   Musculoskeletal: has generalized muscle aches; tylenol in am relieves .  Skin: Negative.        Has skin wounds   Neurological: Negative for dizziness.  Psychiatric/Behavioral: The patient is not nervous/anxious.      Physical Exam  Constitutional: She is oriented to person, place, and time. No distress.  Frail; malnurished   Eyes: Conjunctivae are normal.  Neck: Neck supple. No JVD present. No thyromegaly present.  Cardiovascular:regular rhythm and intact distal pulses.  is tachycardic  Respiratory: Effort normal and breath sounds normal. No respiratory distress. She has no wheezes.  GI: Soft. Bowel sounds are normal. She exhibits no distension. There is no tenderness.  Genitourinary:  Genitourinary Comments: no foley    Musculoskeletal: She exhibits no edema.  Able to move upper extremities Bilateral lower extremities with splints in place    Lymphadenopathy:    She has no cervical adenopathy.  Neurological: She is alert and oriented to person, place, and time.  Skin: Skin is warm and dry. She is not diaphoretic.  Right heel: 2.25 x 1.25 x 0.2  Cm 30% slough Left lateral foot: 1.5 x 4 x 0.1 cm and 2 x 2 x 0.1cm Left medial thigh 2 x 1 x 0.1cm Left ischial stage IV: 5 x 4 x 0.5  40% slough Right ischial 2 x 2 x 0.2 cm Right hip: 2 x 2 cm DTI Sacrum stage IV: 5 x 4  x 0.1  Psychiatric: She has a normal mood and affect.    ASSESSMENT/  PLAN:  1. MS: is followed by neurology. Has bilateral lower extremity splints in place. She is allergic to Tysabri;   2.  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 Sacral decubitus stage IV: will continue treatment per facility protocol; will be followed by wound Dr.  4. Neurogenic bladder;foley is out; has bladder neck erosion is due to have diverting urostomy   5. Protein calorie malnutrition: severe: has been losing weight albumin is 2.6 will continue supplements per facility protocol and will continue prostat 30 cc three times daily  Is on remeron 7.5 mg nightly for appetite her current weight is 83 pounds.  6. Constipation: will continue miralax daily and dulcolax 5 mg tab daily   7. Neutropenia: has had a bone biopsy; will follow up with oncology  8. Dysphagia: no further signs of aspiration; will continue current plan of care and will monitor  9. Anemia:  Likely related to chronic disease and iron deficiency: is status post transfusion 04/2016: hgb 9.4 will continue iron twice daily    10. Tachycardia with low blood pressure: b/p 94/52  she did not tolerate toprol xl. Is being followed by cardiology will monitor .   11. Sepsis related to UTI: will complete keflex 500 mg three times daily for total 3 days post discharge   Will check cbc; cmp; 2 view cxr next week.    Time spent with patient 50  minutes >50% time spent counseling; reviewing medical record; tests; labs; and developing future plan of care   MD is aware of resident's narcotic use and is in agreement with current plan of care. We will attempt to wean resident as apropriate     Ok Edwards NP [facility]  Contact [phone] Monday through Friday 8am- 5pm  After hours call [phone]

## 2016-08-06 ENCOUNTER — Other Ambulatory Visit: Payer: Self-pay | Admitting: Urology

## 2016-08-09 ENCOUNTER — Non-Acute Institutional Stay (SKILLED_NURSING_FACILITY): Payer: Medicare Other

## 2016-08-09 ENCOUNTER — Non-Acute Institutional Stay (SKILLED_NURSING_FACILITY): Payer: Medicare Other | Admitting: Internal Medicine

## 2016-08-09 ENCOUNTER — Encounter: Payer: Self-pay | Admitting: Internal Medicine

## 2016-08-09 DIAGNOSIS — E43 Unspecified severe protein-calorie malnutrition: Secondary | ICD-10-CM | POA: Diagnosis not present

## 2016-08-09 DIAGNOSIS — Z9289 Personal history of other medical treatment: Secondary | ICD-10-CM | POA: Diagnosis not present

## 2016-08-09 DIAGNOSIS — N390 Urinary tract infection, site not specified: Secondary | ICD-10-CM | POA: Diagnosis not present

## 2016-08-09 DIAGNOSIS — G35 Multiple sclerosis: Secondary | ICD-10-CM | POA: Diagnosis not present

## 2016-08-09 DIAGNOSIS — L89154 Pressure ulcer of sacral region, stage 4: Secondary | ICD-10-CM | POA: Diagnosis not present

## 2016-08-09 DIAGNOSIS — G8220 Paraplegia, unspecified: Secondary | ICD-10-CM

## 2016-08-09 DIAGNOSIS — Z0000 Encounter for general adult medical examination without abnormal findings: Secondary | ICD-10-CM

## 2016-08-09 DIAGNOSIS — Z960 Presence of urogenital implants: Principal | ICD-10-CM

## 2016-08-09 DIAGNOSIS — Z978 Presence of other specified devices: Secondary | ICD-10-CM

## 2016-08-09 DIAGNOSIS — R000 Tachycardia, unspecified: Secondary | ICD-10-CM | POA: Diagnosis not present

## 2016-08-09 NOTE — Patient Instructions (Signed)
Deanna Baldwin , Thank you for taking time to come for your Medicare Wellness Visit. I appreciate your ongoing commitment to your health goals. Please review the following plan we discussed and let me know if I can assist you in the future.   Screening recommendations/referrals: Colonoscopy up to date, long term pt Mammogram up to date, long term pt Bone Density due at age 39 Recommended yearly ophthalmology/optometry visit for glaucoma screening and checkup Recommended yearly dental visit for hygiene and checkup  Vaccinations: Influenza vaccine up to date. Due 03/06/17 Pneumococcal vaccine due at age 13 Tdap vaccine due, declined by facility Shingles vaccine not in records  Advanced directives: Need a copy for chart  Conditions/risks identified: None  Next appointment: Dr. Montez Morita makes rounds  Preventive Care 40-64 Years, Female Preventive care refers to lifestyle choices and visits with your health care provider that can promote health and wellness. What does preventive care include?  A yearly physical exam. This is also called an annual well check.  Dental exams once or twice a year.  Routine eye exams. Ask your health care provider how often you should have your eyes checked.  Personal lifestyle choices, including:  Daily care of your teeth and gums.  Regular physical activity.  Eating a healthy diet.  Avoiding tobacco and drug use.  Limiting alcohol use.  Practicing safe sex.  Taking low-dose aspirin daily starting at age 17.  Taking vitamin and mineral supplements as recommended by your health care provider. What happens during an annual well check? The services and screenings done by your health care provider during your annual well check will depend on your age, overall health, lifestyle risk factors, and family history of disease. Counseling  Your health care provider may ask you questions about your:  Alcohol use.  Tobacco use.  Drug use.  Emotional  well-being.  Home and relationship well-being.  Sexual activity.  Eating habits.  Work and work Astronomer.  Method of birth control.  Menstrual cycle.  Pregnancy history. Screening  You may have the following tests or measurements:  Height, weight, and BMI.  Blood pressure.  Lipid and cholesterol levels. These may be checked every 5 years, or more frequently if you are over 35 years old.  Skin check.  Lung cancer screening. You may have this screening every year starting at age 23 if you have a 30-pack-year history of smoking and currently smoke or have quit within the past 15 years.  Fecal occult blood test (FOBT) of the stool. You may have this test every year starting at age 32.  Flexible sigmoidoscopy or colonoscopy. You may have a sigmoidoscopy every 5 years or a colonoscopy every 10 years starting at age 35.  Hepatitis C blood test.  Hepatitis B blood test.  Sexually transmitted disease (STD) testing.  Diabetes screening. This is done by checking your blood sugar (glucose) after you have not eaten for a while (fasting). You may have this done every 1-3 years.  Mammogram. This may be done every 1-2 years. Talk to your health care provider about when you should start having regular mammograms. This may depend on whether you have a family history of breast cancer.  BRCA-related cancer screening. This may be done if you have a family history of breast, ovarian, tubal, or peritoneal cancers.  Pelvic exam and Pap test. This may be done every 3 years starting at age 57. Starting at age 110, this may be done every 5 years if you have a Pap test  in combination with an HPV test.  Bone density scan. This is done to screen for osteoporosis. You may have this scan if you are at high risk for osteoporosis. Discuss your test results, treatment options, and if necessary, the need for more tests with your health care provider. Vaccines  Your health care provider may recommend  certain vaccines, such as:  Influenza vaccine. This is recommended every year.  Tetanus, diphtheria, and acellular pertussis (Tdap, Td) vaccine. You may need a Td booster every 10 years.  Zoster vaccine. You may need this after age 54.  Pneumococcal 13-valent conjugate (PCV13) vaccine. You may need this if you have certain conditions and were not previously vaccinated.  Pneumococcal polysaccharide (PPSV23) vaccine. You may need one or two doses if you smoke cigarettes or if you have certain conditions. Talk to your health care provider about which screenings and vaccines you need and how often you need them. This information is not intended to replace advice given to you by your health care provider. Make sure you discuss any questions you have with your health care provider. Document Released: 02/14/2015 Document Revised: 10/08/2015 Document Reviewed: 11/19/2014 Elsevier Interactive Patient Education  2017 [place] Prevention in the Home Falls can cause injuries. They can happen to people of all ages. There are many things you can do to make your home safe and to help prevent falls. What can I do on the outside of my home?  Regularly fix the edges of walkways and driveways and fix any cracks.  Remove anything that might make you trip as you walk through a door, such as a raised step or threshold.  Trim any bushes or trees on the path to your home.  Use bright outdoor lighting.  Clear any walking paths of anything that might make someone trip, such as rocks or tools.  Regularly check to see if handrails are loose or broken. Make sure that both sides of any steps have handrails.  Any raised decks and porches should have guardrails on the edges.  Have any leaves, snow, or ice cleared regularly.  Use sand or salt on walking paths during winter.  Clean up any spills in your garage right away. This includes oil or grease spills. What can I do in the bathroom?  Use  night lights.  Install grab bars by the toilet and in the tub and shower. Do not use towel bars as grab bars.  Use non-skid mats or decals in the tub or shower.  If you need to sit down in the shower, use a plastic, non-slip stool.  Keep the floor dry. Clean up any water that spills on the floor as soon as it happens.  Remove soap buildup in the tub or shower regularly.  Attach bath mats securely with double-sided non-slip rug tape.  Do not have throw rugs and other things on the floor that can make you trip. What can I do in the bedroom?  Use night lights.  Make sure that you have a light by your bed that is easy to reach.  Do not use any sheets or blankets that are too big for your bed. They should not hang down onto the floor.  Have a firm chair that has side arms. You can use this for support while you get dressed.  Do not have throw rugs and other things on the floor that can make you trip. What can I do in the kitchen?  Clean up any  spills right away.  Avoid walking on wet floors.  Keep items that you use a lot in easy-to-reach places.  If you need to reach something above you, use a strong step stool that has a grab bar.  Keep electrical cords out of the way.  Do not use floor polish or wax that makes floors slippery. If you must use wax, use non-skid floor wax.  Do not have throw rugs and other things on the floor that can make you trip. What can I do with my stairs?  Do not leave any items on the stairs.  Make sure that there are handrails on both sides of the stairs and use them. Fix handrails that are broken or loose. Make sure that handrails are as long as the stairways.  Check any carpeting to make sure that it is firmly attached to the stairs. Fix any carpet that is loose or worn.  Avoid having throw rugs at the top or bottom of the stairs. If you do have throw rugs, attach them to the floor with carpet tape.  Make sure that you have a light switch at  the top of the stairs and the bottom of the stairs. If you do not have them, ask someone to add them for you. What else can I do to help prevent falls?  Wear shoes that:  Do not have high heels.  Have rubber bottoms.  Are comfortable and fit you well.  Are closed at the toe. Do not wear sandals.  If you use a stepladder:  Make sure that it is fully opened. Do not climb a closed stepladder.  Make sure that both sides of the stepladder are locked into place.  Ask someone to hold it for you, if possible.  Clearly mark and make sure that you can see:  Any grab bars or handrails.  First and last steps.  Where the edge of each step is.  Use tools that help you move around (mobility aids) if they are needed. These include:  Canes.  Walkers.  Scooters.  Crutches.  Turn on the lights when you go into a dark area. Replace any light bulbs as soon as they burn out.  Set up your furniture so you have a clear path. Avoid moving your furniture around.  If any of your floors are uneven, fix them.  If there are any pets around you, be aware of where they are.  Review your medicines with your doctor. Some medicines can make you feel dizzy. This can increase your chance of falling. Ask your doctor what other things that you can do to help prevent falls. This information is not intended to replace advice given to you by your health care provider. Make sure you discuss any questions you have with your health care provider. Document Released: 11/14/2008 Document Revised: 06/26/2015 Document Reviewed: 02/22/2014 Elsevier Interactive Patient Education  2017 [employer].

## 2016-08-09 NOTE — Progress Notes (Signed)
Patient ID: Deanna Baldwin, female   DOB: 01-20-1976, 41 y.o.   MRN: [id number]    HISTORY AND PHYSICAL   DATE: 08/09/2016  Location:    [facility] Room Number: 364 B Place of Service: SNF (31)   Extended Emergency Contact Information Primary Emergency Contact: Dalbert Mayotte, [place] [address] Johnnette Litter of [place] Phone: [phone] Mobile Phone: [phone] Relation: Aunt Secondary Emergency Contact: Smith,Crystal  [place] Mobile Phone: [phone] Relation: Sister  Advanced Directive information Does Patient Have a Medical Advance Directive?: Yes, Type of Advance Directive: Out of facility DNR (pink MOST or yellow form), Pre-existing out of facility DNR order (yellow form or pink MOST form): Pink MOST form placed in chart (order not valid for inpatient use), Does patient want to make changes to medical advance directive?: No - Patient declined  Chief Complaint  Patient presents with  . Readmit To SNF    Readmit    HPI:  41 yo female long term resident seen today as a readmission into SNF following hospital stay for sepsis 2/2 UTI, chronic indwelling foley cath, MS with decubitus ulcer, severe protein calorie malnutrition. She presented to ED with (+) urine cx for Proteus mirablis. She was tx with IVF, IV empiric abx -->po keflex. 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 She was seen by urology Dr Louis Meckel and cath removed. Plan for diverting urostomy in a few weeks. Albumin 2.6; plts 715K-->529K; Hgb 9.4 at d/c. She presents to [facility] for long term care.   Today she reports feeling better. She is urinary incontinent. Diverting urostomy procedure scheduled for 09/01/16 by Dr Louis Meckel. No f/c. No f/c. Appetite ok and sleeps well. No falls.  MS -  followed by neurology. She has b/l LE splints in place. She is allergic to Tysabri; she gets rituximab injection 1000 units every 6 months through  neurology; she also gets botox injections and is awaiting an MRI of brain.    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 She is not on medications and uses bilateral splints  Sacral decubitus stage III - she gets treatment per facility protocol; followed by facility wound care provider. 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 She gets nutritional supplements per facility protocol; prostat 30 cc three times daily. She was recently started on remeron. Current wt 83 lb  Constipation - chronic. She takes miralax daily   Dysphagia - stable. No signs of aspiration  Hx Anemia - 2/2 chronic disease and iron deficiency. She has req'd PRBC transfusions in the past. Hgb 9.4  Tachycardia with low blood pressure - has not tolerated BB in past. Followed by cardiology      Past Medical History:  Diagnosis Date  . Buttock wound 03/03/2016  . MS (multiple sclerosis) ([place])   . Pneumonia 02/2016  . Protein calorie malnutrition ([place])   . Severe sepsis ([place]) 03/03/2016  . UTI (urinary tract infection) 02/2016    Past Surgical History:  Procedure Laterality Date  . NO PAST SURGERIES      Patient Care Team: [name], DO as PCP - General (Internal Medicine)  Social History   Social History  . Marital status: Single    Spouse name: N/A  . Number of children: N/A  . Years of education: N/A   Occupational History  .  Disabled    Social History Main Topics  . Smoking status: Former Smoker    Packs/day: 1.00    Types: Cigarettes    Quit date: 02/01/2010  . Smokeless tobacco: Never Used  . Alcohol use No  . Drug use: No  . Sexual activity: Not on file   Other Topics Concern  . Not on file   Social History Narrative   She is a resident at [facility].   Right-handed.     reports that she quit smoking about 6 years ago. Her smoking use included Cigarettes. She smoked 1.00  pack per day. She has never used smokeless tobacco. She reports that she does not drink alcohol or use drugs.  Family History  Problem Relation Age of Onset  . Mental illness Sister    Family Status  Relation Status  . Mother Deceased  . Father Deceased  . Sister Alive  . MGM Deceased  . MGF Deceased  . PGM Deceased  . PGF Deceased  . Sister Alive    Immunization History  Administered Date(s) Administered  . Influenza,inj,Quad PF,36+ Mos 03/06/2016  . PPD Test 03/31/2016    No Known Allergies  Medications: Patient's Medications  New Prescriptions   No medications on file  Previous Medications   ACETAMINOPHEN (TYLENOL) 325 MG TABLET    Take 650 mg by mouth every 6 (six) hours as needed for mild pain.    AMINO ACIDS-PROTEIN HYDROLYS (FEEDING SUPPLEMENT, PRO-STAT SUGAR FREE 64,) LIQD    Take 30 mLs by mouth 3 (three) times daily with meals.   BACLOFEN (LIORESAL) 10 MG TABLET    Take 10 mg by mouth 3 (three) times daily.   BISACODYL (DULCOLAX) 5 MG EC TABLET    Take 5 mg by mouth daily.   COLLAGENASE (SANTYL) OINTMENT    Apply to left ischium topically every day shift for wound healing   FERROUS SULFATE 325 (65 FE) MG TABLET    Take 1 tablet (325 mg total) by mouth 2 (two) times daily with a meal.   MIRTAZAPINE (REMERON) 15 MG TABLET    Take 7.5 mg by mouth at bedtime.    MULTIPLE VITAMINS-MINERALS (DECUBI-VITE) CAPS    Take 1 capsule by mouth daily.   NUTRITIONAL SUPPLEMENTS (NUTRITIONAL SUPPLEMENT PO)    Take 120 mLs by mouth 3 (three) times daily. Med Pass   POLYETHYLENE GLYCOL (MIRALAX / GLYCOLAX) PACKET    Take 17 g by mouth daily.   VITAMIN B-12 (CYANOCOBALAMIN) 1000 MCG TABLET    Take 1,000 mcg by mouth daily.   VITAMIN C (ASCORBIC ACID) 500 MG TABLET    Take 500 mg by mouth 2 (two) times daily.  Modified Medications   No medications on file  Discontinued Medications   No medications on file    Review of Systems  Musculoskeletal: Positive for arthralgias and gait  problem.  Skin: Positive for wound.  All other systems reviewed and are negative.   Vitals:   08/09/16 1108  BP: (!) 94/52  Pulse: (!) 104  Resp: 18  Temp: 99.1 F (37.3 C)  TempSrc: Oral  SpO2: 98%  Weight: 83 lb 1.6 oz (37.7 kg)  Height: '5\' 9"'  (1.753 m)   Body mass index is 12.27 kg/m.  Physical Exam  Constitutional: She is oriented to person, place, and time. She appears well-developed. She appears cachectic. No distress.  Frail appearing in NAD, sitting up in bed  HENT:  Mouth/Throat: Oropharynx is clear and moist. No oropharyngeal  exudate.  Eyes: Pupils are equal, round, and reactive to light. No scleral icterus.  Neck: Neck supple. Carotid bruit is not present. No tracheal deviation present. No thyromegaly present.  Cardiovascular: Regular rhythm, normal heart sounds and intact distal pulses.  Tachycardia present.  Exam reveals no gallop and no friction rub.   No murmur heard. No LE edema b/l. no calf TTP.   Pulmonary/Chest: Effort normal. No stridor. No respiratory distress. She has no wheezes. She has no rales.  Abdominal: Soft. Bowel sounds are normal. She exhibits no distension and no mass. There is no hepatomegaly. There is no tenderness. There is no rebound and no guarding.  Musculoskeletal: She exhibits edema. She exhibits no tenderness.  Contracture b/l LE  Lymphadenopathy:    She has no cervical adenopathy.  Neurological: She is alert and oriented to person, place, and time. She displays atrophy. She exhibits abnormal muscle tone.  Spastic quadriplegia  Skin: Skin is warm and dry. No rash noted.  B/l foot unna boot intact; dsg c/d/i  Psychiatric: She has a normal mood and affect. Her behavior is normal. Judgment and thought content normal.    Labs reviewed: Admission on 07/28/2016, Discharged on 08/03/2016  Component Date Value Ref Range Status  . Sodium 07/28/2016 141  135 - 145 mmol/L Final  . Potassium 07/28/2016 4.4  3.5 - 5.1 mmol/L Final  .  Chloride 07/28/2016 100* 101 - 111 mmol/L Final  . CO2 07/28/2016 29  22 - 32 mmol/L Final  . Glucose, Bld 07/28/2016 111* 65 - 99 mg/dL Final  . BUN 07/28/2016 11  6 - 20 mg/dL Final  . Creatinine, Ser 07/28/2016 0.35* 0.44 - 1.00 mg/dL Final  . Calcium 07/28/2016 9.7  8.9 - 10.3 mg/dL Final  . GFR calc non Af Amer 07/28/2016 >60  >60 mL/min Final  . GFR calc Af Amer 07/28/2016 >60  >60 mL/min Final   Comment: (NOTE) The eGFR has been calculated using the CKD EPI equation. This calculation has not been validated in all clinical situations. eGFR's persistently <60 mL/min signify possible Chronic Kidney Disease.   . Anion gap 07/28/2016 12  5 - 15 Final  . Color, Urine 07/28/2016 YELLOW  YELLOW Final  . APPearance 07/28/2016 HAZY* CLEAR Final  . Specific Gravity, Urine 07/28/2016 1.019  1.005 - 1.030 Final  . pH 07/28/2016 6.0  5.0 - 8.0 Final  . Glucose, UA 07/28/2016 NEGATIVE  NEGATIVE mg/dL Final  . Hgb urine dipstick 07/28/2016 NEGATIVE  NEGATIVE Final  . Bilirubin Urine 07/28/2016 NEGATIVE  NEGATIVE Final  . Ketones, ur 07/28/2016 NEGATIVE  NEGATIVE mg/dL Final  . Protein, ur 07/28/2016 100* NEGATIVE mg/dL Final  . Nitrite 07/28/2016 NEGATIVE  NEGATIVE Final  . Leukocytes, UA 07/28/2016 LARGE* NEGATIVE Final  . RBC / HPF 07/28/2016 6-30  0 - 5 RBC/hpf Final  . WBC, UA 07/28/2016 TOO NUMEROUS TO COUNT  0 - 5 WBC/hpf Final  . Bacteria, UA 07/28/2016 FEW* NONE SEEN Final  . Squamous Epithelial / LPF 07/28/2016 0-5* NONE SEEN Final  . Mucous 07/28/2016 PRESENT   Final  . Specimen Description 07/28/2016 URINE, CLEAN CATCH   Final  . Special Requests 07/28/2016 Normal   Final  . Culture 07/28/2016 >=100,000 COLONIES/mL PROTEUS MIRABILIS*  Final  . Report Status 07/28/2016 07/31/2016 FINAL   Final  . Organism ID, Bacteria 07/28/2016 PROTEUS MIRABILIS*  Final  . WBC 07/28/2016 9.1  4.0 - 10.5 K/uL Final  . RBC 07/28/2016 4.40  3.87 - 5.11 MIL/uL Final  .  Hemoglobin 07/28/2016 11.4*  12.0 - 15.0 g/dL Final  . HCT 07/28/2016 37.7  36.0 - 46.0 % Final  . MCV 07/28/2016 85.7  78.0 - 100.0 fL Final  . MCH 07/28/2016 25.9* 26.0 - 34.0 pg Final  . MCHC 07/28/2016 30.2  30.0 - 36.0 g/dL Final  . RDW 07/28/2016 15.1  11.5 - 15.5 % Final  . Platelets 07/28/2016 715* 150 - 400 K/uL Final  . Neutrophils Relative % 07/28/2016 72  % Final  . Neutro Abs 07/28/2016 6.5  1.7 - 7.7 K/uL Final  . Lymphocytes Relative 07/28/2016 19  % Final  . Lymphs Abs 07/28/2016 1.8  0.7 - 4.0 K/uL Final  . Monocytes Relative 07/28/2016 5  % Final  . Monocytes Absolute 07/28/2016 0.5  0.1 - 1.0 K/uL Final  . Eosinophils Relative 07/28/2016 4  % Final  . Eosinophils Absolute 07/28/2016 0.3  0.0 - 0.7 K/uL Final  . Basophils Relative 07/28/2016 0  % Final  . Basophils Absolute 07/28/2016 0.0  0.0 - 0.1 K/uL Final  . Lactic Acid, Venous 07/28/2016 3.53* 0.5 - 1.9 mmol/L Final  . Specimen Description 07/29/2016 BLOOD RIGHT ANTECUBITAL   Final  . Special Requests 07/29/2016 IN PEDIATRIC BOTTLE Blood Culture adequate volume   Final  . Culture 07/29/2016    Final                   Value:NO GROWTH 5 DAYS Performed at [facility], [place] [address], [place], [place] [address]   . Report Status 07/29/2016 08/03/2016 FINAL   Final  . Specimen Description 07/29/2016 BLOOD LEFT HAND   Final  . Special Requests 07/29/2016 IN PEDIATRIC BOTTLE Blood Culture adequate volume   Final  . Culture 07/29/2016    Final                   Value:NO GROWTH 5 DAYS Performed at [facility], [place] [address] [place], [place] [address]   . Report Status 07/29/2016 08/03/2016 FINAL   Final  . Lactic Acid, Venous 07/29/2016 1.3  0.5 - 1.9 mmol/L Final  . Lactic Acid, Venous 07/29/2016 0.7  0.5 - 1.9 mmol/L Final  . Procalcitonin 07/29/2016 0.19  ng/mL Final   Comment:        Interpretation: PCT (Procalcitonin) <= 0.5 ng/mL: Systemic infection (sepsis) is not likely. Local bacterial infection is  possible. (NOTE)         ICU PCT Algorithm               Non ICU PCT Algorithm    ----------------------------     ------------------------------         PCT < 0.25 ng/mL                 PCT < 0.1 ng/mL     Stopping of antibiotics            Stopping of antibiotics       strongly encouraged.               strongly encouraged.    ----------------------------     ------------------------------       PCT level decrease by               PCT < 0.25 ng/mL       >= 80% from peak PCT       OR PCT 0.25 - 0.5 ng/mL          Stopping of  antibiotics                                             encouraged.     Stopping of antibiotics           encouraged.    ----------------------------     ------------------------------       PCT level decrease by              PCT >= 0.25 ng/mL       < 80% from peak PCT        AND PCT >= 0.5 ng/mL            Continuin                          g antibiotics                                              encouraged.       Continuing antibiotics            encouraged.    ----------------------------     ------------------------------     PCT level increase compared          PCT > 0.5 ng/mL         with peak PCT AND          PCT >= 0.5 ng/mL             Escalation of antibiotics                                          strongly encouraged.      Escalation of antibiotics        strongly encouraged.   . WBC 07/29/2016 9.2  4.0 - 10.5 K/uL Final  . RBC 07/29/2016 3.61* 3.87 - 5.11 MIL/uL Final  . Hemoglobin 07/29/2016 9.0* 12.0 - 15.0 g/dL Final   Comment: DELTA CHECK NOTED REPEATED TO VERIFY   . HCT 07/29/2016 30.5* 36.0 - 46.0 % Final  . MCV 07/29/2016 84.5  78.0 - 100.0 fL Final  . MCH 07/29/2016 24.9* 26.0 - 34.0 pg Final  . MCHC 07/29/2016 29.5* 30.0 - 36.0 g/dL Final  . RDW 07/29/2016 15.1  11.5 - 15.5 % Final  . Platelets 07/29/2016 442* 150 - 400 K/uL Final  . Sodium 07/29/2016 141  135 - 145 mmol/L Final  . Potassium 07/29/2016 4.0  3.5 - 5.1 mmol/L  Final  . Chloride 07/29/2016 105  101 - 111 mmol/L Final  . CO2 07/29/2016 27  22 - 32 mmol/L Final  . Glucose, Bld 07/29/2016 110* 65 - 99 mg/dL Final  . BUN 07/29/2016 11  6 - 20 mg/dL Final  . Creatinine, Ser 07/29/2016 0.33* 0.44 - 1.00 mg/dL Final  . Calcium 07/29/2016 9.1  8.9 - 10.3 mg/dL Final  . GFR calc non Af Amer 07/29/2016 >60  >60 mL/min Final  . GFR calc Af Amer 07/29/2016 >60  >60 mL/min Final   Comment: (NOTE) The eGFR has been calculated using the CKD EPI equation. This calculation has not been validated in all clinical situations.  eGFR's persistently <60 mL/min signify possible Chronic Kidney Disease.   . Anion gap 07/29/2016 9  5 - 15 Final  . WBC 07/30/2016 8.2  4.0 - 10.5 K/uL Final  . RBC 07/30/2016 3.70* 3.87 - 5.11 MIL/uL Final  . Hemoglobin 07/30/2016 9.4* 12.0 - 15.0 g/dL Final  . HCT 07/30/2016 31.2* 36.0 - 46.0 % Final  . MCV 07/30/2016 84.3  78.0 - 100.0 fL Final  . MCH 07/30/2016 25.4* 26.0 - 34.0 pg Final  . MCHC 07/30/2016 30.1  30.0 - 36.0 g/dL Final  . RDW 07/30/2016 15.0  11.5 - 15.5 % Final  . Platelets 07/30/2016 529* 150 - 400 K/uL Final  . Sodium 07/30/2016 141  135 - 145 mmol/L Final  . Potassium 07/30/2016 4.1  3.5 - 5.1 mmol/L Final  . Chloride 07/30/2016 106  101 - 111 mmol/L Final  . CO2 07/30/2016 26  22 - 32 mmol/L Final  . Glucose, Bld 07/30/2016 99  65 - 99 mg/dL Final  . BUN 07/30/2016 5* 6 - 20 mg/dL Final  . Creatinine, Ser 07/30/2016 <0.30* 0.44 - 1.00 mg/dL Final  . Calcium 07/30/2016 9.2  8.9 - 10.3 mg/dL Final  . Total Protein 07/30/2016 6.6  6.5 - 8.1 g/dL Final  . Albumin 07/30/2016 2.6* 3.5 - 5.0 g/dL Final  . AST 07/30/2016 34  15 - 41 U/L Final  . ALT 07/30/2016 37  14 - 54 U/L Final  . Alkaline Phosphatase 07/30/2016 143* 38 - 126 U/L Final  . Total Bilirubin 07/30/2016 0.4  0.3 - 1.2 mg/dL Final  . GFR calc non Af Amer 07/30/2016 NOT CALCULATED  >60 mL/min Final  . GFR calc Af Amer 07/30/2016 NOT CALCULATED  >60  mL/min Final   Comment: (NOTE) The eGFR has been calculated using the CKD EPI equation. This calculation has not been validated in all clinical situations. eGFR's persistently <60 mL/min signify possible Chronic Kidney Disease.   . Anion gap 07/30/2016 9  5 - 15 Final  . Creatinine, Ser 07/31/2016 0.31* 0.44 - 1.00 mg/dL Final  . GFR calc non Af Amer 07/31/2016 >60  >60 mL/min Final  . GFR calc Af Amer 07/31/2016 >60  >60 mL/min Final   Comment: (NOTE) The eGFR has been calculated using the CKD EPI equation. This calculation has not been validated in all clinical situations. eGFR's persistently <60 mL/min signify possible Chronic Kidney Disease.   . Vancomycin Tr 07/31/2016 <4* 15 - 20 ug/mL Final  . Creatinine, Ser 08/01/2016 <0.30* 0.44 - 1.00 mg/dL Final  . GFR calc non Af Amer 08/01/2016 NOT CALCULATED  >60 mL/min Final  . GFR calc Af Amer 08/01/2016 NOT CALCULATED  >60 mL/min Final   Comment: (NOTE) The eGFR has been calculated using the CKD EPI equation. This calculation has not been validated in all clinical situations. eGFR's persistently <60 mL/min signify possible Chronic Kidney Disease.   . Creatinine, Ser 08/02/2016 <0.30* 0.44 - 1.00 mg/dL Final  . GFR calc non Af Amer 08/02/2016 NOT CALCULATED  >60 mL/min Final  . GFR calc Af Amer 08/02/2016 NOT CALCULATED  >60 mL/min Final   Comment: (NOTE) The eGFR has been calculated using the CKD EPI equation. This calculation has not been validated in all clinical situations. eGFR's persistently <60 mL/min signify possible Chronic Kidney Disease.   . Creatinine, Ser 08/03/2016 0.33* 0.44 - 1.00 mg/dL Final  . GFR calc non Af Amer 08/03/2016 >60  >60 mL/min Final  . GFR calc Af Amer 08/03/2016 >60  >  60 mL/min Final   Comment: (NOTE) The eGFR has been calculated using the CKD EPI equation. This calculation has not been validated in all clinical situations. eGFR's persistently <60 mL/min signify possible Chronic  Kidney Disease.   Abstract on 07/20/2016  Component Date Value Ref Range Status  . Glucose 07/19/2016 140   Final  . BUN 07/19/2016 8  4 - 21 Final  . Creatinine 07/19/2016 0.2* 0.5 - 1.1 Final  . Potassium 07/19/2016 3.8  3.4 - 5.3 Final  . Sodium 07/19/2016 138  137 - 147 Final  . Alkaline Phosphatase 07/19/2016 152* 25 - 125 Final  . ALT 07/19/2016 46* 7 - 35 Final  . AST 07/19/2016 34  13 - 35 Final  . Bilirubin, Total 07/19/2016 0.2   Final  Nursing Home on 07/12/2016  Component Date Value Ref Range Status  . Hemoglobin A1C 06/24/2016 5.5   Final  Admission on 07/07/2016, Discharged on 07/10/2016  Component Date Value Ref Range Status  . Sodium 07/07/2016 139  135 - 145 mmol/L Final  . Potassium 07/07/2016 3.5  3.5 - 5.1 mmol/L Final  . Chloride 07/07/2016 101  101 - 111 mmol/L Final  . CO2 07/07/2016 27  22 - 32 mmol/L Final  . Glucose, Bld 07/07/2016 154* 65 - 99 mg/dL Final  . BUN 07/07/2016 7  6 - 20 mg/dL Final  . Creatinine, Ser 07/07/2016 0.46  0.44 - 1.00 mg/dL Final  . Calcium 07/07/2016 8.8* 8.9 - 10.3 mg/dL Final  . Total Protein 07/07/2016 6.5  6.5 - 8.1 g/dL Final  . Albumin 07/07/2016 2.4* 3.5 - 5.0 g/dL Final  . AST 07/07/2016 62* 15 - 41 U/L Final  . ALT 07/07/2016 66* 14 - 54 U/L Final  . Alkaline Phosphatase 07/07/2016 203* 38 - 126 U/L Final  . Total Bilirubin 07/07/2016 0.4  0.3 - 1.2 mg/dL Final  . GFR calc non Af Amer 07/07/2016 >60  >60 mL/min Final  . GFR calc Af Amer 07/07/2016 >60  >60 mL/min Final   Comment: (NOTE) The eGFR has been calculated using the CKD EPI equation. This calculation has not been validated in all clinical situations. eGFR's persistently <60 mL/min signify possible Chronic Kidney Disease.   . Anion gap 07/07/2016 11  5 - 15 Final  . WBC 07/07/2016 10.8* 4.0 - 10.5 K/uL Final  . RBC 07/07/2016 3.95  3.87 - 5.11 MIL/uL Final  . Hemoglobin 07/07/2016 10.4* 12.0 - 15.0 g/dL Final  . HCT 07/07/2016 34.9* 36.0 - 46.0 % Final   . MCV 07/07/2016 88.4  78.0 - 100.0 fL Final  . MCH 07/07/2016 26.3  26.0 - 34.0 pg Final  . MCHC 07/07/2016 29.8* 30.0 - 36.0 g/dL Final  . RDW 07/07/2016 14.9  11.5 - 15.5 % Final  . Platelets 07/07/2016 661* 150 - 400 K/uL Final  . Neutrophils Relative % 07/07/2016 89  % Final  . Neutro Abs 07/07/2016 9.5* 1.7 - 7.7 K/uL Final  . Lymphocytes Relative 07/07/2016 8  % Final  . Lymphs Abs 07/07/2016 0.8  0.7 - 4.0 K/uL Final  . Monocytes Relative 07/07/2016 3  % Final  . Monocytes Absolute 07/07/2016 0.4  0.1 - 1.0 K/uL Final  . Eosinophils Relative 07/07/2016 0  % Final  . Eosinophils Absolute 07/07/2016 0.0  0.0 - 0.7 K/uL Final  . Basophils Relative 07/07/2016 0  % Final  . Basophils Absolute 07/07/2016 0.0  0.0 - 0.1 K/uL Final  . Lactic Acid, Venous 07/07/2016 2.47* 0.5 -  1.9 mmol/L Final  . Comment 07/07/2016 NOTIFIED PHYSICIAN   Final  . Specimen Description 07/07/2016 BLOOD RIGHT ANTECUBITAL   Final  . Special Requests 07/07/2016 BOTTLES DRAWN AEROBIC AND ANAEROBIC Blood Culture adequate volume   Final  . Culture 07/07/2016 NO GROWTH 5 DAYS   Final  . Report Status 07/07/2016 07/12/2016 FINAL   Final  . Specimen Description 07/07/2016 BLOOD RIGHT ARM   Final  . Special Requests 07/07/2016 BOTTLES DRAWN AEROBIC AND ANAEROBIC Blood Culture adequate volume   Final  . Culture 07/07/2016 NO GROWTH 5 DAYS   Final  . Report Status 07/07/2016 07/12/2016 FINAL   Final  . Color, Urine 07/07/2016 YELLOW  YELLOW Final  . APPearance 07/07/2016 TURBID* CLEAR Final  . Specific Gravity, Urine 07/07/2016 1.020  1.005 - 1.030 Final  . pH 07/07/2016 8.5* 5.0 - 8.0 Final  . Glucose, UA 07/07/2016 NEGATIVE  NEGATIVE mg/dL Final  . Hgb urine dipstick 07/07/2016 SMALL* NEGATIVE Final  . Bilirubin Urine 07/07/2016 NEGATIVE  NEGATIVE Final  . Ketones, ur 07/07/2016 NEGATIVE  NEGATIVE mg/dL Final  . Protein, ur 07/07/2016 100* NEGATIVE mg/dL Final  . Nitrite 07/07/2016 POSITIVE* NEGATIVE Final  .  Leukocytes, UA 07/07/2016 MODERATE* NEGATIVE Final  . Specimen Description 07/07/2016 URINE, CATHETERIZED   Final  . Special Requests 07/07/2016 Normal   Final  . Culture 07/07/2016 >=100,000 COLONIES/mL PROTEUS MIRABILIS*  Final  . Report Status 07/07/2016 07/10/2016 FINAL   Final  . Organism ID, Bacteria 07/07/2016 PROTEUS MIRABILIS*  Final  . RBC / HPF 07/07/2016 0-5  0 - 5 RBC/hpf Final  . WBC, UA 07/07/2016 TOO NUMEROUS TO COUNT  0 - 5 WBC/hpf Final  . Bacteria, UA 07/07/2016 MANY* NONE SEEN Final  . Squamous Epithelial / LPF 07/07/2016 0-5* NONE SEEN Final  . Urine-Other 07/07/2016 LESS THAN 10 mL OF URINE SUBMITTED   Final   MICROSCOPIC EXAM PERFORMED ON UNCONCENTRATED URINE  . Sodium 07/08/2016 139  135 - 145 mmol/L Final  . Potassium 07/08/2016 3.2* 3.5 - 5.1 mmol/L Final  . Chloride 07/08/2016 105  101 - 111 mmol/L Final  . CO2 07/08/2016 24  22 - 32 mmol/L Final  . Glucose, Bld 07/08/2016 129* 65 - 99 mg/dL Final  . BUN 07/08/2016 <5* 6 - 20 mg/dL Final  . Creatinine, Ser 07/08/2016 0.30* 0.44 - 1.00 mg/dL Final  . Calcium 07/08/2016 8.0* 8.9 - 10.3 mg/dL Final  . GFR calc non Af Amer 07/08/2016 >60  >60 mL/min Final  . GFR calc Af Amer 07/08/2016 >60  >60 mL/min Final   Comment: (NOTE) The eGFR has been calculated using the CKD EPI equation. This calculation has not been validated in all clinical situations. eGFR's persistently <60 mL/min signify possible Chronic Kidney Disease.   . Anion gap 07/08/2016 10  5 - 15 Final  . WBC 07/08/2016 7.9  4.0 - 10.5 K/uL Final  . RBC 07/08/2016 2.97* 3.87 - 5.11 MIL/uL Final  . Hemoglobin 07/08/2016 7.7* 12.0 - 15.0 g/dL Final   Comment: DELTA CHECK NOTED REPEATED TO VERIFY   . HCT 07/08/2016 26.3* 36.0 - 46.0 % Final  . MCV 07/08/2016 88.6  78.0 - 100.0 fL Final  . MCH 07/08/2016 25.9* 26.0 - 34.0 pg Final  . MCHC 07/08/2016 29.3* 30.0 - 36.0 g/dL Final  . RDW 07/08/2016 15.2  11.5 - 15.5 % Final  . Platelets 07/08/2016 463*  150 - 400 K/uL Final  . Lactic Acid, Venous 07/07/2016 2.3* 0.5 - 1.9 mmol/L Final  Comment: CRITICAL RESULT CALLED TO, READ BACK BY AND VERIFIED WITH: M.WOOD,RN [id number] 07/08/16 M.CAMPBELL   . Lactic Acid, Venous 07/08/2016 1.4  0.5 - 1.9 mmol/L Final  . MRSA by PCR 07/07/2016 POSITIVE* NEGATIVE Final   Comment:        The GeneXpert MRSA Assay (FDA approved for NASAL specimens only), is one component of a comprehensive MRSA colonization surveillance program. It is not intended to diagnose MRSA infection nor to guide or monitor treatment for MRSA infections. RESULT CALLED TO, READ BACK BY AND VERIFIED WITH: Rob Bunting RN AT 0622 07/08/16 BY A.DAVIS   . Cortisol, Plasma 07/08/2016 10.3  ug/dL Final   Comment: (NOTE) AM    6.7 - 22.6 ug/dL PM   <10.0       ug/dL   . WBC 07/09/2016 7.3  4.0 - 10.5 K/uL Final  . RBC 07/09/2016 3.43* 3.87 - 5.11 MIL/uL Final  . Hemoglobin 07/09/2016 9.0* 12.0 - 15.0 g/dL Final  . HCT 07/09/2016 30.7* 36.0 - 46.0 % Final  . MCV 07/09/2016 89.5  78.0 - 100.0 fL Final  . MCH 07/09/2016 26.2  26.0 - 34.0 pg Final  . MCHC 07/09/2016 29.3* 30.0 - 36.0 g/dL Final  . RDW 07/09/2016 15.1  11.5 - 15.5 % Final  . Platelets 07/09/2016 521* 150 - 400 K/uL Final  . Sodium 07/09/2016 141  135 - 145 mmol/L Final  . Potassium 07/09/2016 4.8  3.5 - 5.1 mmol/L Final  . Chloride 07/09/2016 108  101 - 111 mmol/L Final  . CO2 07/09/2016 24  22 - 32 mmol/L Final  . Glucose, Bld 07/09/2016 82  65 - 99 mg/dL Final  . BUN 07/09/2016 5* 6 - 20 mg/dL Final  . Creatinine, Ser 07/09/2016 0.31* 0.44 - 1.00 mg/dL Final  . Calcium 07/09/2016 9.1  8.9 - 10.3 mg/dL Final  . GFR calc non Af Amer 07/09/2016 >60  >60 mL/min Final  . GFR calc Af Amer 07/09/2016 >60  >60 mL/min Final   Comment: (NOTE) The eGFR has been calculated using the CKD EPI equation. This calculation has not been validated in all clinical situations. eGFR's persistently <60 mL/min signify possible Chronic  Kidney Disease.   . Anion gap 07/09/2016 9  5 - 15 Final  . WBC 07/10/2016 7.4  4.0 - 10.5 K/uL Final  . RBC 07/10/2016 4.00  3.87 - 5.11 MIL/uL Final  . Hemoglobin 07/10/2016 10.4* 12.0 - 15.0 g/dL Final  . HCT 07/10/2016 35.9* 36.0 - 46.0 % Final  . MCV 07/10/2016 89.8  78.0 - 100.0 fL Final  . MCH 07/10/2016 26.0  26.0 - 34.0 pg Final  . MCHC 07/10/2016 29.0* 30.0 - 36.0 g/dL Final  . RDW 07/10/2016 15.2  11.5 - 15.5 % Final  . Platelets 07/10/2016 349  150 - 400 K/uL Final  . Sodium 07/10/2016 141  135 - 145 mmol/L Final  . Potassium 07/10/2016 5.6* 3.5 - 5.1 mmol/L Final   SLIGHT HEMOLYSIS  . Chloride 07/10/2016 104  101 - 111 mmol/L Final  . CO2 07/10/2016 27  22 - 32 mmol/L Final  . Glucose, Bld 07/10/2016 89  65 - 99 mg/dL Final  . BUN 07/10/2016 8  6 - 20 mg/dL Final  . Creatinine, Ser 07/10/2016 0.39* 0.44 - 1.00 mg/dL Final  . Calcium 07/10/2016 9.4  8.9 - 10.3 mg/dL Final  . GFR calc non Af Amer 07/10/2016 >60  >60 mL/min Final  . GFR calc Af Amer 07/10/2016 >60  >60  mL/min Final   Comment: (NOTE) The eGFR has been calculated using the CKD EPI equation. This calculation has not been validated in all clinical situations. eGFR's persistently <60 mL/min signify possible Chronic Kidney Disease.   . Anion gap 07/10/2016 10  5 - 15 Final  . Preg Test, Ur 07/10/2016 NEGATIVE  NEGATIVE Final   Comment:        THE SENSITIVITY OF THIS METHODOLOGY IS >20 mIU/mL.   Nursing Home on 06/23/2016  Component Date Value Ref Range Status  . Hemoglobin 06/23/2016 9.4* 12.0 - 16.0 g/dL Final  . HCT 06/23/2016 31* 36 - 46 % Final  . Neutrophils Absolute 06/23/2016 10  /L Final  . Platelets 06/23/2016 629* 150 - 399 K/L Final  . WBC 06/23/2016 12.1  10^3/mL Final  . Glucose 06/23/2016 150  mg/dL Final  . BUN 06/23/2016 12  4 - 21 mg/dL Final  . Creatinine 06/23/2016 0.2* 0.5 - 1.1 mg/dL Final  . Potassium 06/23/2016 3.9  3.4 - 5.3 mmol/L Final  . Sodium 06/23/2016 139  137 - 147  mmol/L Final  . Alkaline Phosphatase 06/23/2016 219* 25 - 125 U/L Final  . ALT 06/23/2016 63* 7 - 35 U/L Final  . AST 06/23/2016 44* 13 - 35 U/L Final  . Bilirubin, Total 06/23/2016 0.2  mg/dL Final  . Vitamin B-12 06/23/2016 287   Final  . TSH 06/23/2016 2.28  0.41 - 5.90 uIU/mL Final  Office Visit on 06/08/2016  Component Date Value Ref Range Status  . WBC 06/24/2016 12.0* 3.4 - 10.8 x10E3/uL Final  . RBC 06/24/2016 3.69* 3.77 - 5.28 x10E6/uL Final  . Hemoglobin 06/24/2016 9.7* 11.1 - 15.9 g/dL Final  . Hematocrit 06/24/2016 30.9* 34.0 - 46.6 % Final  . MCV 06/24/2016 84  79 - 97 fL Final  . MCH 06/24/2016 26.3* 26.6 - 33.0 pg Final  . MCHC 06/24/2016 31.4* 31.5 - 35.7 g/dL Final  . RDW 06/24/2016 14.8  12.3 - 15.4 % Final  . Neutrophils 06/24/2016 84  Not Estab. % Final  . Lymphs 06/24/2016 10  Not Estab. % Final  . Monocytes 06/24/2016 5  Not Estab. % Final  . Eos 06/24/2016 1  Not Estab. % Final  . Basos 06/24/2016 0  Not Estab. % Final  . Neutrophils Absolute 06/24/2016 9.9* 1.4 - 7.0 x10E3/uL Final  . Lymphocytes Absolute 06/24/2016 1.2  0.7 - 3.1 x10E3/uL Final  . Monocytes Absolute 06/24/2016 0.6  0.1 - 0.9 x10E3/uL Final  . EOS (ABSOLUTE) 06/24/2016 0.2  0.0 - 0.4 x10E3/uL Final  . Basophils Absolute 06/24/2016 0.0  0.0 - 0.2 x10E3/uL Final  . Immature Granulocytes 06/24/2016 0  Not Estab. % Final  . Immature Grans (Abs) 06/24/2016 0.0  0.0 - 0.1 x10E3/uL Final  . TSH 06/24/2016 4.160  0.450 - 4.500 uIU/mL Final  . QUANTIFERON INCUBATION 06/24/2016 Comment   Final   Specimen incubated at [place], [place], [place].  Marland Kitchen Hepatitis B Surface Ag 06/24/2016 Negative  Negative Final  . Hep B Surface Ab, Qual 06/24/2016 Non Reactive   Final   Comment:               Non Reactive: Inconsistent with immunity,                             less than 10 mIU/mL               Reactive:     Consistent  with immunity,                             greater than 9.9 mIU/mL   . Hep B Core Total  Ab 06/24/2016 Negative  Negative Final  . Varicella zoster IgG 06/24/2016 2342  Immune >165 index Final   Comment:                                Negative          <135                                Equivocal    135 - 165                                Positive          >165 A positive result generally indicates exposure to the pathogen or administration of specific immunoglobulins, but it is not indication of active infection or stage of disease.   Marland Kitchen Hep C Virus Ab 06/24/2016 <0.1  0.0 - 0.9 s/co ratio Final   Comment:                                   Negative:     < 0.8                              Indeterminate: 0.8 - 0.9                                   Positive:     > 0.9  The CDC recommends that a positive HCV antibody result  be followed up with a HCV Nucleic Acid Amplification  test (875643).   . QUANTIFERON TB GOLD 06/24/2016 Negative  Negative Final  . QUANTIFERON CRITERIA 06/24/2016 Comment   Final   Comment: To be considered positive a specimen should have a TB Ag minus Nil value greater than or equal to 0.35 IU/mL and in addition the TB Ag minus Nil value must be greater than or equal to 25% of the Nil value. There may be insufficient information in these values to differentiate between some negative and some indeterminate test values.   . QUANTIFERON TB AG VALUE 06/24/2016 0.07  IU/mL Final  . Quantiferon Nil Value 06/24/2016 0.07  IU/mL Final  . QUANTIFERON MITOGEN VALUE 06/24/2016 4.91  IU/mL Final  . QFT TB AG MINUS NIL VALUE 06/24/2016 0.00  IU/mL Final  . Interpretation: 06/24/2016 Comment   Final   Comment: The QuantiFERON TB Gold (in Tube) assay is intended for use as an aid in the diagnosis of TB infection. Negative results suggest that there is no TB infection. In patients with high suspicion of exposure, a negative test should be repeated. A positive test indicates infection with Mycobacterium tuberculosis. Among individuals without tuberculosis  infection, a positive test may be due to exposure to [place], M. szulgai or M. marinum. On the Internet, go to https://figueroa-lambert.info/ for further details.   Nursing Home on 05/11/2016  Component Date Value Ref Range  Status  . Hemoglobin 03/19/2016 10.7* 12.0 - 16.0 g/dL Final  . HCT 03/19/2016 36  36 - 46 % Final  . Neutrophils Absolute 03/19/2016 10  /L Final  . Platelets 03/19/2016 513* 150 - 399 K/L Final  . WBC 03/19/2016 12.3  10^3/mL Final  . Glucose 03/19/2016 112  mg/dL Final  . BUN 03/19/2016 14  4 - 21 mg/dL Final  . Creatinine 03/19/2016 0.4* 0.5 - 1.1 mg/dL Final  . Potassium 03/19/2016 4.7  3.4 - 5.3 mmol/L Final  . Sodium 03/19/2016 149* 137 - 147 mmol/L Final  . Alkaline Phosphatase 03/19/2016 113  25 - 125 U/L Final  . ALT 03/19/2016 64* 7 - 35 U/L Final  . AST 03/19/2016 74* 13 - 35 U/L Final  . Bilirubin, Total 03/19/2016 0.3  mg/dL Final  . Hemoglobin 05/12/2016 10.3* 12.0 - 16.0 g/dL Final  . HCT 05/12/2016 33* 36 - 46 % Final  . Neutrophils Absolute 05/12/2016 8  /L Final  . WBC 05/12/2016 9.8  10^3/mL Final  . Glucose 05/12/2016 92  mg/dL Final  . BUN 05/12/2016 11  4 - 21 mg/dL Final  . Creatinine 05/12/2016 0.5  0.5 - 1.1 mg/dL Final  . Potassium 05/12/2016 4.5  3.4 - 5.3 mmol/L Final  . Sodium 05/12/2016 140  137 - 147 mmol/L Final  . Alkaline Phosphatase 05/12/2016 81  25 - 125 U/L Final  . ALT 05/12/2016 15  7 - 35 U/L Final  . AST 05/12/2016 17  13 - 35 U/L Final  . Bilirubin, Total 05/12/2016 0.5  mg/dL Final    Dg Abd 1 View  Result Date: 07/10/2016 CLINICAL DATA:  Urinary tract infection EXAM: ABDOMEN - 1 VIEW COMPARISON:  CT abdomen and pelvis Jun 25, 2008 FINDINGS: There is diffuse stool throughout the colon. There is a paucity of small bowel gas. There is no bowel dilatation or air-fluid level to suggest bowel obstruction. No free air. There are scattered probable vascular calcifications in the abdomen. Lung bases are clear. IMPRESSION: Diffuse stool  throughout colon. Paucity of small bowel gas may be seen normally but also may be seen with early ileus or enteritis. Lung bases clear. No evident free air. Electronically Signed   By: Lowella Grip III M.D.   On: 07/10/2016 12:05   Mr Brain Wo Contrast  Result Date: 07/15/2016  [facility] [address] [place], [place] [address] [phone] NEUROIMAGING REPORT STUDY DATE: 07/15/2016 PATIENT NAME: Lujuana Kapler DOB: 1976-01-28 MRN: [id number] EXAM: MRI Brain without contrast ORDERING CLINICIAN: Marcial Pacas M.D. PhD CLINICAL HISTORY: 41 year old woman with multiple sclerosis COMPARISON FILMS: None available TECHNIQUE: MRI of the brain without contrast was obtained utilizing 5 mm axial slices with T1, T2, T2 flair, SWI and diffusion weighted views.  T1 sagittal and T2 coronal views were obtained. CONTRAST: none IMAGING SITE: [facility], [place], [place] FINDINGS: On sagittal images, the spinal cord is imaged caudally to C3-C4 and is normal in caliber.   The contents of the posterior fossa are of normal size and position.   The pituitary gland and optic chiasm appear normal.    There is moderate cortical atrophy. Atrophy is also noted in the corpus callosum and the brainstem. The ventricles are enlarged, in proportion to the extent of atrophy. There are no abnormal extra-axial collections of fluid.  There are T2/FLAIR hyperintense foci noted within the left posterior medulla, right medulla, bilateral inferior pons, right greater than left mid pons and right  cerebral peduncle. Additionally, there are T2 hyperintense foci in the left greater than right middle cerebellar peduncles and a few small foci in the cerebellum. In the hemispheres, there are extensive T2/FLAIR hyperintense foci in the periventricular, juxtacortical and deep white matter bilaterally. There are a couple punctate hyperintense foci in the thalamus. The basal ganglia appears normal. None of the foci  appeared to be acute or enhance.  Diffusion weighted images are normal.  Susceptibility weighted images are normal.  The orbits appear normal.   The VIIth/VIIIth nerve complex appears normal.  The mastoid air cells appear normal.  The paranasal sinuses appear normal.  Flow voids are identified within the major intracerebral arteries.      This MRI of the brain with and without contrast shows the following: 1.   Multiple infratentorial and hemispheric T2/FLAIR hyperintense foci consistent with chronic demyelinating plaque associated with multiple sclerosis. None of the foci appeared to be acute. 2.   Moderate cortical atrophy and corpus callosum atrophy with mild brainstem atrophy. 3.   There are no acute findings. INTERPRETING PHYSICIAN: Richard A. Felecia Shelling, MD, PhD, FAAN Certified in  Neuroimaging by [employer]   Mr Cervical Spine Wo Contrast  Result Date: 07/15/2016  [facility] [address], [place], [place] [address] [phone] NEUROIMAGING REPORT STUDY DATE: 07/15/2016 PATIENT NAME: Tarisa Paola DOB: 05/06/1975 MRN: [id number] EXAM: MRI of the cervical spine without contrast ORDERING CLINICIAN: Marcial Pacas M.D. 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 In addition 4 mm axial slices from L4-9 down to T1-2 level were included with T2 and gradient echo views. CONTRAST: None (no IV access) IMAGING SITE: [facility], [address] [place], [place], [place] FINDINGS: :  Movement artifact is noted on many of his sequences.  On sagittal images, the spine is imaged from above the cervicomedullary junction to T2.   The spinal cord is slightly reduced in caliber.   There is patchy T2 hyperintense signal changes noted in the spinal  cord more focal lesions noted posteriorly adjacent to C2, posteriorly more to the left adjacent to C3-C4, posteriorly slightly more to the right adjacent to C4-C5, to the right adjacent to C5, anteriorly adjacent to C6-C7 and posteriorly adjacent to T1..   The vertebral bodies are normally aligned.   The vertebral bodies have normal signal.  The discs and interspaces were further evaluated on axial views from C2 to T1.   There are minimal disc bulges at C3-C4, C4-C5 and C5-C6 but do not lead to any spinal stenosis or foraminal narrowing. There is no nerve root compression    This MRI of the cervical spine without contrast shows the following: 1.   Multiple T2 hyperintense foci within the spinal cord as detailed above.   IV access was not obtainable for contrast. 2.   Mild disc bulges at C3-C4, C4-C5 and C5-C6 but do not lead to any nerve root compression or central canal narrowing. INTERPRETING PHYSICIAN: Richard A. Felecia Shelling, MD, PhD, FAAN Certified in  Neuroimaging by [employer]   Dg Chest [place] 1 View  Result Date: 07/29/2016 CLINICAL DATA:  Sepsis EXAM: PORTABLE CHEST 1 VIEW COMPARISON:  07/07/2016 FINDINGS: A single AP portable view of the chest demonstrates no focal airspace consolidation or alveolar edema. The lungs  are grossly clear. There is no large effusion or pneumothorax. Cardiac and mediastinal contours appear unremarkable. IMPRESSION: No active disease. Electronically Signed   By: Andreas Newport M.D.   On: 07/29/2016 01:04     Assessment/Plan   ICD-10-CM   1. Foley catheter in place Z92.89   2. Urinary tract infection without hematuria, site unspecified N39.0   3. Decubitus ulcer of sacral region, stage 4 ([place]) L89.154   4. Tachycardia R00.0   5. Protein-calorie malnutrition, severe ([place]) E43   6. 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 Relapsing remitting multiple sclerosis ([place]) G35      Cont current meds as  ordered  CBC, CMP and CXR ordered  F/u with urology as scheduled  PT/OT/ST as indicated  F/u with neurology as scheduled  GOAL: short term rehab then long term care. Communicated with pt and nursing.  Will follow  Cage Gupton S. Perlie Gold  [facility] [address] [place], [place] [address] [phone] Cell (Monday-Friday 8 AM - 5 PM) [phone] After 5 PM and follow prompts

## 2016-08-09 NOTE — Progress Notes (Signed)
Subjective:   Deanna Baldwin is a 42 y.o. female who presents for an Initial Medicare Annual Wellness Visit at [facility]     Objective:    Today's Vitals   08/09/16 1414  BP: 100/60  Pulse: 78  Temp: 98.5 F (36.9 C)  TempSrc: Oral  SpO2: 95%  Weight: 83 lb (37.6 kg)  Height: 5\' 9"  (1.753 m)   Body mass index is 12.26 kg/m.   Current Medications (verified) Outpatient Encounter Prescriptions as of 08/09/2016  Medication Sig  . acetaminophen (TYLENOL) 325 MG tablet Take 650 mg by mouth every 6 (six) hours as needed for mild pain.   . Amino Acids-Protein Hydrolys (FEEDING SUPPLEMENT, PRO-STAT SUGAR FREE 64,) LIQD Take 30 mLs by mouth 3 (three) times daily with meals.  . baclofen (LIORESAL) 10 MG tablet Take 10 mg by mouth 3 (three) times daily.  . bisacodyl (DULCOLAX) 5 MG EC tablet Take 5 mg by mouth daily.  . collagenase (SANTYL) ointment Apply to left ischium topically every day shift for wound healing  . ferrous sulfate 325 (65 FE) MG tablet Take 1 tablet (325 mg total) by mouth 2 (two) times daily with a meal.  . mirtazapine (REMERON) 15 MG tablet Take 7.5 mg by mouth at bedtime.   . Multiple Vitamins-Minerals (DECUBI-VITE) CAPS Take 1 capsule by mouth daily.  . Nutritional Supplements (NUTRITIONAL SUPPLEMENT PO) Take 120 mLs by mouth 3 (three) times daily. Med Pass  . polyethylene glycol (MIRALAX / GLYCOLAX) packet Take 17 g by mouth daily.  . vitamin B-12 (CYANOCOBALAMIN) 1000 MCG tablet Take 1,000 mcg by mouth daily.  . vitamin C (ASCORBIC ACID) 500 MG tablet Take 500 mg by mouth 2 (two) times daily.   No facility-administered encounter medications on file as of 08/09/2016.     Allergies (verified) Patient has no known allergies.   History: Past Medical History:  Diagnosis Date  . Buttock wound 03/03/2016  . MS (multiple sclerosis) ([place])   . Pneumonia 02/2016  . Protein calorie malnutrition ([place])   . Severe sepsis ([place]) 03/03/2016  . UTI (urinary  tract infection) 02/2016   Past Surgical History:  Procedure Laterality Date  . NO PAST SURGERIES     Family History  Problem Relation Age of Onset  . Mental illness Sister    Social History   Occupational History  . Disabled    Social History Main Topics  . Smoking status: Former Smoker    Packs/day: 1.00    Years: 10.00    Types: Cigarettes    Quit date: 02/01/2010  . Smokeless tobacco: Never Used  . Alcohol use No  . Drug use: No  . Sexual activity: Not on file    Tobacco Counseling Counseling given: Not Answered   Activities of Daily Living In your present state of health, do you have any difficulty performing the following activities: 08/09/2016 07/28/2016  Hearing? N N  Vision? Y N  Difficulty concentrating or making decisions? N N  Walking or climbing stairs? Y Y  Dressing or bathing? Y Y  Doing errands, shopping? Tempie Donning  Preparing Food and eating ? Y -  Using the Toilet? Y -  In the past six months, have you accidently leaked urine? Y -  Do you have problems with loss of bowel control? Y -  Managing your Medications? Y -  Managing your Finances? Y -  Housekeeping or managing your Housekeeping? Y -  Some recent data might be hidden  Immunizations and Health Maintenance Immunization History  Administered Date(s) Administered  . Influenza,inj,Quad PF,36+ Mos 03/06/2016  . PPD Test 03/31/2016   There are no preventive care reminders to display for this patient.  Patient Care Team: [name], DO as PCP - General (Internal Medicine)  Indicate any recent Medical Services you may have received from other than [facility] providers in the past year (date may be approximate).     Assessment:   This is a routine wellness examination for [facility].   Hearing/Vision screen No exam data present  Dietary issues and exercise activities discussed: Current Exercise Habits: Structured exercise class, Type of exercise: Other - see comments (physical theraphy), Time  (Minutes): 30, Frequency (Times/Week): 7, Weekly Exercise (Minutes/Week): 210, Exercise limited by: orthopedic condition(s)  Goals    . Get in the chair          Patient will work with physical therapy to get into the chair.      Depression Screen PHQ 2/9 Scores 08/09/2016 02/23/2016  PHQ - 2 Score 0 0    Fall Risk Fall Risk  08/09/2016 02/23/2016  Falls in the past year? No No    Cognitive Function:     6CIT Screen 08/09/2016  What Year? 0 points  What month? 0 points  What time? 0 points  Count back from 20 0 points  Months in reverse 0 points  Repeat phrase 0 points  Total Score 0    Screening Tests Health Maintenance  Topic Date Due  . PAP SMEAR  04/08/2017 (Originally 07/15/1997)  . TETANUS/TDAP  04/08/2017 (Originally 07/16/1995)  . INFLUENZA VACCINE  09/01/2016  . HIV Screening  Completed      Plan:  I have personally reviewed and addressed the Medicare Annual Wellness questionnaire and have noted the following in the patient's chart:  A. Medical and social history B. Use of alcohol, tobacco or illicit drugs  C. Current medications and supplements D. Functional ability and status E.  Nutritional status F.  Physical activity G. Advance directives H. List of other physicians I.  Hospitalizations, surgeries, and ER visits in previous 12 months J.  [place] to include hearing, vision, cognitive, depression L. Referrals and appointments - none  In addition, I have reviewed and discussed with patient certain preventive protocols, quality metrics, and best practice recommendations. A written personalized care plan for preventive services as well as general preventive health recommendations were provided to patient.  See attached scanned questionnaire for additional information.   Signed,   Rich Reining, RN Nurse Health Advisor   Quick Notes   Health Maintenance:      Abnormal Screen:     Patient Concerns:      Nurse Concerns:

## 2016-08-10 DIAGNOSIS — L89154 Pressure ulcer of sacral region, stage 4: Secondary | ICD-10-CM | POA: Diagnosis not present

## 2016-08-10 DIAGNOSIS — L89610 Pressure ulcer of right heel, unstageable: Secondary | ICD-10-CM | POA: Diagnosis not present

## 2016-08-10 DIAGNOSIS — L89894 Pressure ulcer of other site, stage 4: Secondary | ICD-10-CM | POA: Diagnosis not present

## 2016-08-10 DIAGNOSIS — L89144 Pressure ulcer of left lower back, stage 4: Secondary | ICD-10-CM | POA: Diagnosis not present

## 2016-08-11 DIAGNOSIS — G114 Hereditary spastic paraplegia: Secondary | ICD-10-CM | POA: Diagnosis not present

## 2016-08-12 LAB — HEPATIC FUNCTION PANEL
ALK PHOS: 140 — AB (ref 25–125)
ALT: 32 (ref 7–35)
AST: 35 (ref 13–35)
BILIRUBIN, TOTAL: 0.4

## 2016-08-12 LAB — BASIC METABOLIC PANEL
BUN: 9 (ref 4–21)
Creatinine: 0.3 — AB (ref 0.5–1.1)
Glucose: 100
Potassium: 4.2 (ref 3.4–5.3)
SODIUM: 141 (ref 137–147)

## 2016-08-12 LAB — CBC AND DIFFERENTIAL
HEMATOCRIT: 33 — AB (ref 36–46)
HEMOGLOBIN: 10.1 — AB (ref 12.0–16.0)
NEUTROS ABS: 8
PLATELETS: 539 — AB (ref 150–399)
WBC: 9.7

## 2016-08-16 DIAGNOSIS — G114 Hereditary spastic paraplegia: Secondary | ICD-10-CM | POA: Diagnosis not present

## 2016-08-17 DIAGNOSIS — L89144 Pressure ulcer of left lower back, stage 4: Secondary | ICD-10-CM | POA: Diagnosis not present

## 2016-08-17 DIAGNOSIS — L89894 Pressure ulcer of other site, stage 4: Secondary | ICD-10-CM | POA: Diagnosis not present

## 2016-08-17 DIAGNOSIS — L89154 Pressure ulcer of sacral region, stage 4: Secondary | ICD-10-CM | POA: Diagnosis not present

## 2016-08-18 NOTE — Progress Notes (Signed)
Spoke with Claretta Fraise, LPN, unit coordinator requesting last progress note, MAR, to be Faxed to me

## 2016-08-19 DIAGNOSIS — F329 Major depressive disorder, single episode, unspecified: Secondary | ICD-10-CM | POA: Diagnosis not present

## 2016-08-19 DIAGNOSIS — F4320 Adjustment disorder, unspecified: Secondary | ICD-10-CM | POA: Diagnosis not present

## 2016-08-19 DIAGNOSIS — R630 Anorexia: Secondary | ICD-10-CM | POA: Diagnosis not present

## 2016-08-20 ENCOUNTER — Encounter (HOSPITAL_COMMUNITY): Payer: Self-pay | Admitting: *Deleted

## 2016-08-20 NOTE — Progress Notes (Signed)
Preop instructions for:  Deanna Baldwin                       Date of Birth   03-May-1975                         Date of Procedure: 09/01/2016       Doctor:Dr. Louis Meckel  Time to arrive at [facility]: Report to: Admitting  Procedure:Urinary Ileal Conduit  Any procedure time changes, MD office will notify you!   Do not eat or drink past midnight the night before your procedure.(To include any tube feedings-must be discontinued)  Reminder:Follow bowel prep instructions per MD office! Follow a clear liquid diet the day before surgery all day up until midnight!    CLEAR LIQUID DIET   Foods Allowed                                                                     Foods Excluded  Coffee and tea, regular and decaf                             liquids that you cannot  Plain Jell-O in any flavor                                             see through such as: Fruit ices (not with fruit pulp)                                     milk, soups, orange juice  Iced Popsicles                                    All solid food Carbonated beverages, regular and diet                                    Cranberry, grape and apple juices Sports drinks like Gatorade Lightly seasoned clear broth or consume(fat free) Sugar, honey syrup  Sample Menu Breakfast                                Lunch                                     Supper Cranberry juice                    Beef broth                            Chicken broth Jell-O  Grape juice                           Apple juice Coffee or tea                        Jell-O                                      Popsicle                                                Coffee or tea                        Coffee or tea  _____________________________________________________________________    Take these morning medications only with sips of water.(or give through gastrostomy or feeding  tube). NONE  Note: No Insulin or Diabetic meds should be given or taken the morning of the procedure!   Facility contact: Claretta Fraise, LPN, unit coordinator                 Phone:  [phone],  [place]: NONE- patient signs own consent  Transportation contact phone#: [place] [phone]  Please send day of procedure:current med list and meds last taken that day, confirm nothing by mouth status from what time, Patient Demographic info( to include DNR status, problem list, allergies)   RN contact name/phone#:  Claretta Fraise, LPN, unit coordinator   Phone:[phone]   and                   Fax #: [phone]   Bring Insurance card and picture ID Leave all jewelry and other valuables at place where living( no metal or rings to be worn) No contact lens Women-no make-up, no lotions,perfumes,powders Men-no colognes,lotions  Any questions day before  Procedure or morning of surgery,call Short Stay  Unit at [facility] -[phone]!  Sent from :[facility]                   [place]                   Fax:[phone]  Sent by :Charmaine Downs. Trudee Grip, BSN, RN

## 2016-08-24 DIAGNOSIS — G114 Hereditary spastic paraplegia: Secondary | ICD-10-CM | POA: Diagnosis not present

## 2016-08-26 DIAGNOSIS — N312 Flaccid neuropathic bladder, not elsewhere classified: Secondary | ICD-10-CM | POA: Diagnosis not present

## 2016-08-30 ENCOUNTER — Inpatient Hospital Stay (HOSPITAL_COMMUNITY)
Admission: RE | Admit: 2016-08-30 | Discharge: 2016-09-10 | DRG: 853 | Disposition: A | Discharge status: Skilled Nursing Facility | Payer: Medicare Other | Source: Ambulatory Visit | Attending: Internal Medicine | Admitting: Internal Medicine

## 2016-08-30 DIAGNOSIS — G8220 Paraplegia, unspecified: Secondary | ICD-10-CM | POA: Diagnosis present

## 2016-08-30 DIAGNOSIS — K567 Ileus, unspecified: Secondary | ICD-10-CM | POA: Diagnosis not present

## 2016-08-30 DIAGNOSIS — Z87891 Personal history of nicotine dependence: Secondary | ICD-10-CM

## 2016-08-30 DIAGNOSIS — Z802 Family history of malignant neoplasm of other respiratory and intrathoracic organs: Secondary | ICD-10-CM | POA: Diagnosis not present

## 2016-08-30 DIAGNOSIS — J189 Pneumonia, unspecified organism: Secondary | ICD-10-CM | POA: Diagnosis present

## 2016-08-30 DIAGNOSIS — E43 Unspecified severe protein-calorie malnutrition: Secondary | ICD-10-CM | POA: Diagnosis not present

## 2016-08-30 DIAGNOSIS — J9809 Other diseases of bronchus, not elsewhere classified: Secondary | ICD-10-CM | POA: Diagnosis not present

## 2016-08-30 DIAGNOSIS — L89153 Pressure ulcer of sacral region, stage 3: Secondary | ICD-10-CM | POA: Diagnosis present

## 2016-08-30 DIAGNOSIS — N319 Neuromuscular dysfunction of bladder, unspecified: Secondary | ICD-10-CM | POA: Diagnosis present

## 2016-08-30 DIAGNOSIS — M6281 Muscle weakness (generalized): Secondary | ICD-10-CM | POA: Diagnosis not present

## 2016-08-30 DIAGNOSIS — R140 Abdominal distension (gaseous): Secondary | ICD-10-CM

## 2016-08-30 DIAGNOSIS — K7689 Other specified diseases of liver: Secondary | ICD-10-CM | POA: Diagnosis not present

## 2016-08-30 DIAGNOSIS — I9589 Other hypotension: Secondary | ICD-10-CM | POA: Diagnosis not present

## 2016-08-30 DIAGNOSIS — I959 Hypotension, unspecified: Secondary | ICD-10-CM

## 2016-08-30 DIAGNOSIS — Z8249 Family history of ischemic heart disease and other diseases of the circulatory system: Secondary | ICD-10-CM | POA: Diagnosis not present

## 2016-08-30 DIAGNOSIS — N311 Reflex neuropathic bladder, not elsewhere classified: Secondary | ICD-10-CM | POA: Diagnosis not present

## 2016-08-30 DIAGNOSIS — N390 Urinary tract infection, site not specified: Secondary | ICD-10-CM | POA: Diagnosis not present

## 2016-08-30 DIAGNOSIS — K9189 Other postprocedural complications and disorders of digestive system: Secondary | ICD-10-CM | POA: Diagnosis not present

## 2016-08-30 DIAGNOSIS — G35 Multiple sclerosis: Secondary | ICD-10-CM | POA: Diagnosis not present

## 2016-08-30 DIAGNOSIS — B9562 Methicillin resistant Staphylococcus aureus infection as the cause of diseases classified elsewhere: Secondary | ICD-10-CM | POA: Diagnosis present

## 2016-08-30 DIAGNOSIS — Z7401 Bed confinement status: Secondary | ICD-10-CM

## 2016-08-30 DIAGNOSIS — I10 Essential (primary) hypertension: Secondary | ICD-10-CM | POA: Diagnosis present

## 2016-08-30 DIAGNOSIS — L8990 Pressure ulcer of unspecified site, unspecified stage: Secondary | ICD-10-CM | POA: Insufficient documentation

## 2016-08-30 DIAGNOSIS — N3946 Mixed incontinence: Secondary | ICD-10-CM | POA: Diagnosis present

## 2016-08-30 DIAGNOSIS — Z818 Family history of other mental and behavioral disorders: Secondary | ICD-10-CM | POA: Diagnosis not present

## 2016-08-30 DIAGNOSIS — R7989 Other specified abnormal findings of blood chemistry: Secondary | ICD-10-CM | POA: Diagnosis not present

## 2016-08-30 DIAGNOSIS — D638 Anemia in other chronic diseases classified elsewhere: Secondary | ICD-10-CM | POA: Diagnosis present

## 2016-08-30 DIAGNOSIS — R32 Unspecified urinary incontinence: Secondary | ICD-10-CM | POA: Diagnosis not present

## 2016-08-30 DIAGNOSIS — R509 Fever, unspecified: Secondary | ICD-10-CM | POA: Diagnosis not present

## 2016-08-30 DIAGNOSIS — L8992 Pressure ulcer of unspecified site, stage 2: Secondary | ICD-10-CM | POA: Diagnosis not present

## 2016-08-30 DIAGNOSIS — D649 Anemia, unspecified: Secondary | ICD-10-CM | POA: Diagnosis not present

## 2016-08-30 DIAGNOSIS — J9811 Atelectasis: Secondary | ICD-10-CM | POA: Diagnosis present

## 2016-08-30 DIAGNOSIS — R945 Abnormal results of liver function studies: Secondary | ICD-10-CM

## 2016-08-30 DIAGNOSIS — L89309 Pressure ulcer of unspecified buttock, unspecified stage: Secondary | ICD-10-CM | POA: Diagnosis not present

## 2016-08-30 DIAGNOSIS — Z681 Body mass index (BMI) 19 or less, adult: Secondary | ICD-10-CM | POA: Diagnosis not present

## 2016-08-30 DIAGNOSIS — A419 Sepsis, unspecified organism: Principal | ICD-10-CM | POA: Diagnosis present

## 2016-08-30 DIAGNOSIS — Z79899 Other long term (current) drug therapy: Secondary | ICD-10-CM

## 2016-08-30 DIAGNOSIS — Z833 Family history of diabetes mellitus: Secondary | ICD-10-CM

## 2016-08-30 DIAGNOSIS — K560 Paralytic ileus: Secondary | ICD-10-CM | POA: Diagnosis not present

## 2016-08-30 DIAGNOSIS — N3941 Urge incontinence: Secondary | ICD-10-CM | POA: Diagnosis not present

## 2016-08-30 DIAGNOSIS — N368 Other specified disorders of urethra: Secondary | ICD-10-CM | POA: Diagnosis not present

## 2016-08-30 DIAGNOSIS — R1312 Dysphagia, oropharyngeal phase: Secondary | ICD-10-CM | POA: Diagnosis not present

## 2016-08-30 DIAGNOSIS — R6520 Severe sepsis without septic shock: Secondary | ICD-10-CM | POA: Diagnosis not present

## 2016-08-30 DIAGNOSIS — K449 Diaphragmatic hernia without obstruction or gangrene: Secondary | ICD-10-CM | POA: Diagnosis not present

## 2016-08-30 DIAGNOSIS — L89629 Pressure ulcer of left heel, unspecified stage: Secondary | ICD-10-CM | POA: Diagnosis present

## 2016-08-30 DIAGNOSIS — R000 Tachycardia, unspecified: Secondary | ICD-10-CM | POA: Diagnosis not present

## 2016-08-30 HISTORY — DX: Neutropenia, unspecified: D70.9

## 2016-08-30 HISTORY — DX: Cardiac arrhythmia, unspecified: I49.9

## 2016-08-30 HISTORY — DX: Paraplegia, unspecified: G35

## 2016-08-30 HISTORY — DX: Paraplegia, unspecified: G82.20

## 2016-08-30 LAB — BASIC METABOLIC PANEL
Anion gap: 14 (ref 5–15)
BUN: 11 mg/dL (ref 6–20)
CHLORIDE: 100 mmol/L — AB (ref 101–111)
CO2: 29 mmol/L (ref 22–32)
CREATININE: 0.4 mg/dL — AB (ref 0.44–1.00)
Calcium: 9.8 mg/dL (ref 8.9–10.3)
GFR calc non Af Amer: >60 mL/min (ref >60)
Glucose, Bld: 106 mg/dL — ABNORMAL HIGH (ref 65–99)
POTASSIUM: 4.7 mmol/L (ref 3.5–5.1)
Sodium: 143 mmol/L (ref 135–145)

## 2016-08-30 LAB — TYPE AND SCREEN
ABO/RH(D): AB POS
Antibody Screen: NEGATIVE

## 2016-08-30 LAB — CBC
HCT: 39.9 % (ref 36.0–46.0)
Hemoglobin: 12.1 g/dL (ref 12.0–15.0)
MCH: 26 pg (ref 26.0–34.0)
MCHC: 30.3 g/dL (ref 30.0–36.0)
MCV: 85.6 fL (ref 78.0–100.0)
PLATELETS: 603 10*3/uL — AB (ref 150–400)
RBC: 4.66 MIL/uL (ref 3.87–5.11)
RDW: 15.2 % (ref 11.5–15.5)
WBC: 12.2 10*3/uL — ABNORMAL HIGH (ref 4.0–10.5)

## 2016-08-30 LAB — SURGICAL PCR SCREEN
MRSA, PCR: POSITIVE — AB
Staphylococcus aureus: POSITIVE — AB

## 2016-08-30 MED ORDER — CHLORHEXIDINE GLUCONATE CLOTH 2 % EX PADS
6.0000 | MEDICATED_PAD | Freq: Every day | CUTANEOUS | Status: AC
  Administered 2016-08-31 – 2016-09-04 (×5): 6 via TOPICAL

## 2016-08-30 MED ORDER — ACETAMINOPHEN 325 MG PO TABS
650.0000 mg | ORAL_TABLET | Freq: Four times a day (QID) | ORAL | Status: DC | PRN
  Administered 2016-08-30: 650 mg via ORAL

## 2016-08-30 MED ORDER — PIPERACILLIN-TAZOBACTAM 3.375 G IVPB 30 MIN: 3.3750 g | Freq: Four times a day (QID) | INTRAVENOUS | Status: DC

## 2016-08-30 MED ORDER — ORAL CARE MOUTH RINSE
15.0000 mL | Freq: Two times a day (BID) | OROMUCOSAL | Status: DC
  Administered 2016-08-31 – 2016-09-10 (×17): 15 mL via OROMUCOSAL

## 2016-08-30 MED ORDER — SODIUM CHLORIDE 0.9 % IV BOLUS (SEPSIS)
500.0000 mL | Freq: Once | INTRAVENOUS | Status: AC
  Administered 2016-08-30: 500 mL via INTRAVENOUS

## 2016-08-30 MED ORDER — HEPARIN SODIUM (PORCINE) 5000 UNIT/ML IJ SOLN
5000.0000 [IU] | Freq: Three times a day (TID) | INTRAMUSCULAR | Status: DC
  Administered 2016-08-30 – 2016-09-10 (×30): 5000 [IU] via SUBCUTANEOUS
  Filled 2016-08-30 (×31): qty 1

## 2016-08-30 MED ORDER — PIPERACILLIN-TAZOBACTAM 3.375 G IVPB
3.3750 g | Freq: Three times a day (TID) | INTRAVENOUS | Status: DC
Start: 2016-08-30 — End: 2016-09-01
  Administered 2016-08-30 – 2016-09-01 (×6): 3.375 g via INTRAVENOUS
  Filled 2016-08-30 (×6): qty 50

## 2016-08-30 MED ORDER — CHLORHEXIDINE GLUCONATE 0.12 % MT SOLN
15.0000 mL | Freq: Two times a day (BID) | OROMUCOSAL | Status: DC
Start: 2016-08-30 — End: 2016-09-10
  Administered 2016-08-31 – 2016-09-10 (×19): 15 mL via OROMUCOSAL
  Filled 2016-08-30 (×18): qty 15

## 2016-08-30 MED ORDER — ALVIMOPAN 12 MG PO CAPS
12.0000 mg | ORAL_CAPSULE | ORAL | Status: DC
  Filled 2016-08-30: qty 1

## 2016-08-30 MED ORDER — VANCOMYCIN HCL IN DEXTROSE 750-5 MG/150ML-% IV SOLN
750.0000 mg | INTRAVENOUS | Status: AC
  Administered 2016-08-30 – 2016-09-02 (×4): 750 mg via INTRAVENOUS
  Filled 2016-08-30 (×4): qty 150

## 2016-08-30 MED ORDER — SODIUM CHLORIDE 0.45 % IV SOLN
INTRAVENOUS | Status: DC
  Administered 2016-08-30 – 2016-09-02 (×7): via INTRAVENOUS

## 2016-08-30 MED ORDER — MUPIROCIN 2 % EX OINT
1.0000 "application " | TOPICAL_OINTMENT | Freq: Two times a day (BID) | CUTANEOUS | Status: AC
  Administered 2016-08-30 – 2016-09-04 (×9): 1 via NASAL
  Filled 2016-08-30: qty 22

## 2016-08-30 NOTE — Progress Notes (Signed)
CRITICAL VALUE ALERT  Critical Value: Positive MRSA  Date & Time Notied:  08/30/16  Provider Notified: Dr. Louis Meckel  Orders Received/Actions taken: Per protocol

## 2016-08-30 NOTE — Consult Note (Addendum)
[place] Nurse ostomy consult note: [place] Nurse requested for preoperative stoma site marking by Dr. Louis Meckel.  Discussed surgical procedure and stoma creation with patient.  Explained role of the [place] nurse team.  Left educational booklet at bedside.  Examined patient lying, and we did our best to position her into a sitting position, as patient states she spends most of time sitting up in her wheelchair, despite contractures of the lower extremities. Patient will not be self care.  Attempted to  to place the marking away from any creases or abdominal contour issues and within the rectus muscle.  Patient has a narrow rectus muscle, weighs just 85 pounds. Numerous pressure wounds. (See below) Marked for ileal conduit in the RLQ  5cm to the right of the umbilicus and  1cm below the umbilicus.  Patient's abdomen cleansed with CHG wipes x2 at site marking, allowed to air dry prior to marking.Covered mark with thin film transparent dressing to preserve mark until date of surgery, Wednesday, August 1.  I am not working that day or the next; one of my partners will see in my absence.   [place] Nurse wound consult note: Reason for Consult: Numerous pressure injuries complicated by moisture (mixed incontinence: urinary and fecal incontinence) Wound type:Pressure plus moisture Pressure Injury POA: Yes Measurement: Sacral:  5cm x 4cm x 0.2cm. Stage 3.  Red, moist with small amount of serous exudate. Left ischial tuberosity:  5cm x 3.5cm x 0.4cm with 80% red wound bed, 20% yellow necrotic slough. Stage 3.  Moderate amount serous exudate. Left lateral foot:  6.5cm x 2.5cm x 0.4cm.  90% red wound bed; 10% necrotic slough. Moderate amount serous exudate. Full thickness. Left lateral malleolus:  2cm round area of bark brown discoloration. Partial thickness.  Reabsorbing blister. No wound bed. Right medial heel:  0.4cm round wound with 0.5cm depth and mild undermining toward 9 o'clock.Full thickness.  Red wound bed, serous  to serosanguinous exudate after exploration. Surrounding skin with evidence of previous wound healing. Left posterior heel:  3.5cm x 3.5cm Deep tissue pressure injury (DTPI) with purple/red discoloration. No exudate. Wound bed:As described above Drainage (amount, consistency, odor) As described above Periwound: Intact, macerated in the buttocks, posterior thigh, medial thigh and bilateral LE and feet areas from copious amounts of urine.  Staff will explore with Dr. Levora Dredge if a short-term indwelling urinary catheter is an option as patient has failed attempt to manage UI with a female external urinary containment device (PurWick). Dressing procedure/placement/frequency: I will provide Nursing with guidance for wound care via the orders.  Patient was placed upon a therapeutic sleep surface (mattress replacement) with low air loss feature upon arrival to the unit.   [facility] nursing team will follow, and will remain available to this patient, the nursing and medical teams.   Thanks, Maudie Flakes, MSN, RN, [place], Arther Abbott  Pager# [phone]

## 2016-08-30 NOTE — Progress Notes (Signed)
Blood culture obtained but unable to get urine culture  because patient is incontinent and unable to cath patient X 3.

## 2016-08-30 NOTE — H&P (Signed)
Neurogenic bladder.  HPI: Deanna Baldwin is a 41 year-old female established patient who is here for further management and eval of a neurogenic bladder.  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   She does not have recurrent infections.   Her neurogenic bladder has been treated with chronic foley.   Seen last by Dr. Alinda Money. Had UDS in 2011 showing a non-compliant bladder with weak detrusor contractions and DSD. She was placed on CIC and Tamsulosin. She was then lost to follow-up.   The patient has a history of progressive spastic multiple sclerosis. She's been bedbound for several years now. In Jan 2018 the patient developed the flu and became severely ill. At that point, a Foley catheter was inserted. Prior to that she was voiding on her own and using tamsulosin. Over the next several months she developed a sacral decubitus ulcer. Given this, the catheter was kept in. 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 The foley catheter was upsized to 71F and 15cc placed in the balloon. Despite these efforts she continued to leak despite the catheter. She was admitted for a UTI last month. She was seen in the hospital and we discussed treatment options at that point. I recommened a supravesical diversion.   Intv: Patient presents today on her own to discuss upcoming surgery. She has been recovering uneventfully at [facility]. She now managed with a diaper. She leaks continually.     ALLERGIES: No Allergies    MEDICATIONS: Acetaminophen Er 650 mg tablet, extended release  Bisacodyl 5 mg tablet, delayed release  Decubi-Vite  Ferrous Sulfate 325 mg (65 mg iron) tablet  Polyethylene Glycol 3350 17 gram powder in packet  Tamsulosin Hcl 0.4 mg capsule, ext release 24 hr 0 Oral  Tizanidine Hcl 4 mg tablet Oral  Tubersol 5 tub. 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, muscular, Muscular disorder of urethra - 2014 Urinary Retention, Unspec, Urinary retention - 2014    NON-GU PMH: Multiple sclerosis, Multiple Sclerosis - 2014 Encounter for general adult medical examination without abnormal findings, Encounter for preventive health examination    FAMILY HISTORY: Diabetes - Mother, Grandmother End Stage Renal Disease - Mother Hypertension - Grandmother Laryngeal Cancer - Father   SOCIAL HISTORY: Marital Status: Single Preferred Language: English; Ethnicity: Not Hispanic Or Latino; Race: Black or African American     Notes: Marital History - Single, Alcohol Use, Tobacco Use   REVIEW OF SYSTEMS:    GU Review Female:   Patient denies frequent urination, hard to postpone urination, burning /pain with urination, get up at night to urinate, leakage of urine, stream starts and stops, trouble starting your stream, have to strain to urinate, and being pregnant.  Gastrointestinal (Upper):   Patient denies nausea, vomiting, and indigestion/ heartburn.  Gastrointestinal (Lower):   Patient denies diarrhea and constipation.  Constitutional:   Patient denies fever, night sweats, weight loss, and fatigue.  Skin:   Patient denies skin rash/ lesion and itching.  Eyes:   Patient denies blurred vision and double vision.  Ears/ Nose/ Throat:   Patient denies sore throat and sinus problems.  Hematologic/Lymphatic:   Patient denies swollen glands and easy bruising.  Cardiovascular:  Patient denies leg swelling and chest pains.  Respiratory:   Patient denies cough and shortness of breath.  Endocrine:   Patient denies excessive thirst.  Musculoskeletal:   Patient denies back pain and joint pain.  Neurological:   Patient denies dizziness and headaches.  Psychologic:   Patient denies  depression and anxiety.   VITAL SIGNS:      08/26/2016 04:09 PM  Weight 100 lb / 45.36 kg  Height 69 in / 175.26 cm  BP 88/61 mmHg  Pulse 121 /min  Temperature 98.2 F / 36.7 C  BMI 14.8 kg/m   MULTI-SYSTEM PHYSICAL EXAMINATION:    Constitutional: very thin and cachetic. alert and oriented.  Neck: Neck symmetrical, not swollen. Normal tracheal position.  Respiratory: No labored breathing, no use of accessory muscles. CTA-B  Cardiovascular: Normal temperature, normal extremity pulses, no swelling, no varicosities. RRR  Lymphatic: No enlargement of neck, axillae, groin.  Skin: No paleness, no jaundice, no cyanosis. No lesion, no ulcer, no rash.  Neurologic / Psychiatric: Oriented to time, oriented to place, oriented to person. No depression, no anxiety, no agitation.  Gastrointestinal: No mass, no tenderness, no rigidity, non obese abdomen.  Eyes: Normal conjunctivae. Normal eyelids.  Ears, Nose, Mouth, and Throat: Left ear no scars, no lesions, no masses. Right ear no scars, no lesions, no masses. Nose no scars, no lesions, no masses. Normal hearing. Normal lips.  Musculoskeletal: contracted lower extremities. thin/symmetric     PAST DATA REVIEWED:  Source Of History:  Patient  Records Review:   Previous Patient Records  X-Ray Review: C.T. Abdomen/Pelvis: Reviewed Films. 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 She is completely incontinent as a result with little hope of recovery. Her incontinence has contributed to the poor healing of her sacral decubidi.   Reviewing her situation from a clinical standpoint I think the best option for this patient is a supravesicle diversion requiring an ilieostomy. We discussed the rationale of this, and she  aggreed. We also discussed continued managment with diapers. I don't think a catheter at this point will make any difference given the degree of erosion. Further, as she progresses with her MS, this may a good way for her to manage her urine without leakage/maceration/skin irritation.   The patient was very attentive during our visit today and ask alot of questions. However, I will touch base with her sister as well given the extent of the operation. The patient understands that she'll have an ostomy in which urine trickles out constantly and will have to be managed. She also understands that she will be in the hospital for 4-5 nights after the operation.   Finally, we will try and get her admitted early so that we can ensure that she gets her bowel prep and is ready for surgery. She will also need to meet with the ostomy nurse ahead of time.

## 2016-08-30 NOTE — Progress Notes (Signed)
Pharmacy Antibiotic Note  Deanna Baldwin is a 41 y.o. female admitted on 08/30/2016 with cellulitis.  Pharmacy has been consulted for Vancomycin & Zosyn dosing.  Plan: Vancomycin 750 IV every 24 hours.  Goal trough 10-15 mcg/mL. Zosyn 3.375g IV q8h (4 hour infusion).  Daily serum creatinine  Height: 5\' 9"  (175.3 cm) Weight: 84 lb 14.4 oz (38.5 kg) IBW/kg (Calculated) : 66.2  Temp (24hrs), Avg:100.9 F (38.3 C), Min:100.9 F (38.3 C), Max:100.9 F (38.3 C)   Recent Labs Lab 08/30/16 1430  WBC 12.2*  CREATININE 0.40*    Estimated Creatinine Clearance: 56.8 mL/min (A) (by C-G formula based on SCr of 0.4 mg/dL (L)).    No Known Allergies  Antimicrobials this admission: 7/30 Vancomycin >>  7/30 Zosyn >>   Dose adjustments this admission:  Microbiology results:        UCx: ordered, need collection        BCx: ordered, need collection  Thank you for allowing pharmacy to be a part of this patient's care.  Minda Ditto PharmD Pager [phone] 08/30/2016, 6:31 PM

## 2016-08-30 NOTE — Progress Notes (Signed)
Patient direct admit from SNF. Alert and oriented, oriented patient to room. reviewed plan of care with patient and Dr. Louis Meckel notified of patient's arrival. Will continue to F/U with plan of care.

## 2016-08-30 NOTE — Care Management Note (Signed)
Case Management Note  Patient Details  Name: Deanna Baldwin MRN: [id number] Date of Birth: [birth year]  Subjective/Objective: 41 y/o f admitted w/ileal conduit. From SNF. Woc following. Hx: paraplegia, multiple pressure injuries.CSW following for return SNF.                   Action/Plan:d/c SNF.   Expected Discharge Date:                  Expected Discharge Plan:  Skilled Nursing Facility  In-House Referral:  Clinical Social Work  Discharge planning Services  CM Consult  Post Acute Care Choice:    Choice offered to:     DME Arranged:    DME Agency:     HH Arranged:    [place] Agency:     Status of Service:  In process, will continue to follow  If discussed at Long Length of Stay Meetings, dates discussed:    Additional Comments:  Dessa Phi, RN 08/30/2016, 3:18 PM

## 2016-08-30 NOTE — Progress Notes (Signed)
Notified Dr. Louis Meckel of patient's temperature 103.2 order given for tylenol and MD to placed order for antibiotic will continue to monitor.

## 2016-08-31 ENCOUNTER — Encounter (HOSPITAL_COMMUNITY): Payer: Self-pay | Admitting: Radiology

## 2016-08-31 ENCOUNTER — Inpatient Hospital Stay (HOSPITAL_COMMUNITY): Payer: Medicare Other

## 2016-08-31 DIAGNOSIS — J181 Lobar pneumonia, unspecified organism: Secondary | ICD-10-CM

## 2016-08-31 DIAGNOSIS — K7689 Other specified diseases of liver: Secondary | ICD-10-CM

## 2016-08-31 DIAGNOSIS — L8990 Pressure ulcer of unspecified site, unspecified stage: Secondary | ICD-10-CM | POA: Insufficient documentation

## 2016-08-31 DIAGNOSIS — R945 Abnormal results of liver function studies: Secondary | ICD-10-CM

## 2016-08-31 DIAGNOSIS — R000 Tachycardia, unspecified: Secondary | ICD-10-CM

## 2016-08-31 DIAGNOSIS — L89159 Pressure ulcer of sacral region, unspecified stage: Secondary | ICD-10-CM

## 2016-08-31 DIAGNOSIS — R509 Fever, unspecified: Secondary | ICD-10-CM | POA: Diagnosis present

## 2016-08-31 DIAGNOSIS — G8220 Paraplegia, unspecified: Secondary | ICD-10-CM

## 2016-08-31 DIAGNOSIS — I959 Hypotension, unspecified: Secondary | ICD-10-CM | POA: Diagnosis not present

## 2016-08-31 DIAGNOSIS — A419 Sepsis, unspecified organism: Principal | ICD-10-CM

## 2016-08-31 LAB — D-DIMER, QUANTITATIVE (NOT AT ARMC): D DIMER QUANT: 5.37 ug{FEU}/mL — AB (ref 0.00–0.50)

## 2016-08-31 LAB — BLOOD CULTURE ID PANEL (REFLEXED)
Acinetobacter baumannii: NOT DETECTED
CANDIDA GLABRATA: NOT DETECTED
CANDIDA KRUSEI: NOT DETECTED
CANDIDA TROPICALIS: NOT DETECTED
Candida albicans: NOT DETECTED
Candida parapsilosis: NOT DETECTED
ENTEROBACTER CLOACAE COMPLEX: NOT DETECTED
ESCHERICHIA COLI: NOT DETECTED
Enterobacteriaceae species: NOT DETECTED
Enterococcus species: NOT DETECTED
Haemophilus influenzae: NOT DETECTED
KLEBSIELLA PNEUMONIAE: NOT DETECTED
Klebsiella oxytoca: NOT DETECTED
Listeria monocytogenes: NOT DETECTED
Methicillin resistance: DETECTED — AB
Neisseria meningitidis: NOT DETECTED
PROTEUS SPECIES: NOT DETECTED
Pseudomonas aeruginosa: NOT DETECTED
STAPHYLOCOCCUS SPECIES: DETECTED — AB
Serratia marcescens: NOT DETECTED
Staphylococcus aureus (BCID): NOT DETECTED
Streptococcus agalactiae: NOT DETECTED
Streptococcus pneumoniae: NOT DETECTED
Streptococcus pyogenes: NOT DETECTED
Streptococcus species: NOT DETECTED

## 2016-08-31 LAB — CBC
HCT: 29.4 % — ABNORMAL LOW (ref 36.0–46.0)
HEMOGLOBIN: 9 g/dL — AB (ref 12.0–15.0)
MCH: 25.6 pg — AB (ref 26.0–34.0)
MCHC: 30.6 g/dL (ref 30.0–36.0)
MCV: 83.8 fL (ref 78.0–100.0)
Platelets: 457 10*3/uL — ABNORMAL HIGH (ref 150–400)
RBC: 3.51 MIL/uL — AB (ref 3.87–5.11)
RDW: 15.1 % (ref 11.5–15.5)
WBC: 9.7 10*3/uL (ref 4.0–10.5)

## 2016-08-31 LAB — COMPREHENSIVE METABOLIC PANEL
ALBUMIN: 2.1 g/dL — AB (ref 3.5–5.0)
ALT: 62 U/L — AB (ref 14–54)
AST: 60 U/L — AB (ref 15–41)
Alkaline Phosphatase: 134 U/L — ABNORMAL HIGH (ref 38–126)
Anion gap: 10 (ref 5–15)
BUN: 15 mg/dL (ref 6–20)
CHLORIDE: 103 mmol/L (ref 101–111)
CO2: 25 mmol/L (ref 22–32)
CREATININE: 0.42 mg/dL — AB (ref 0.44–1.00)
Calcium: 8.1 mg/dL — ABNORMAL LOW (ref 8.9–10.3)
GFR calc Af Amer: >60 mL/min (ref >60)
GFR calc non Af Amer: >60 mL/min (ref >60)
GLUCOSE: 147 mg/dL — AB (ref 65–99)
POTASSIUM: 3.8 mmol/L (ref 3.5–5.1)
Sodium: 138 mmol/L (ref 135–145)
Total Bilirubin: 0.4 mg/dL (ref 0.3–1.2)
Total Protein: 5.7 g/dL — ABNORMAL LOW (ref 6.5–8.1)

## 2016-08-31 LAB — ECHOCARDIOGRAM COMPLETE
HEIGHTINCHES: 69 in
WEIGHTICAEL: 1358.4 [oz_av]

## 2016-08-31 LAB — CORTISOL: Cortisol, Plasma: 15 ug/dL

## 2016-08-31 LAB — TROPONIN I
Troponin I: <0.03 ng/mL (ref <0.03)
Troponin I: <0.03 ng/mL (ref <0.03)

## 2016-08-31 LAB — LACTIC ACID, PLASMA: LACTIC ACID, VENOUS: 2.4 mmol/L — AB (ref 0.5–1.9)

## 2016-08-31 MED ORDER — BOOST / RESOURCE BREEZE PO LIQD
1.0000 | Freq: Three times a day (TID) | ORAL | Status: DC
Start: 2016-08-31 — End: 2016-09-01
  Administered 2016-08-31 (×2): 1 via ORAL

## 2016-08-31 MED ORDER — SODIUM CHLORIDE 0.9 % IV BOLUS (SEPSIS)
500.0000 mL | Freq: Once | INTRAVENOUS | Status: AC
  Administered 2016-08-31: 500 mL via INTRAVENOUS

## 2016-08-31 MED ORDER — DOCUSATE SODIUM 100 MG PO CAPS
100.0000 mg | ORAL_CAPSULE | Freq: Two times a day (BID) | ORAL | Status: DC
  Administered 2016-08-31 – 2016-09-02 (×5): 100 mg via ORAL
  Filled 2016-08-31 (×6): qty 1

## 2016-08-31 MED ORDER — SODIUM CHLORIDE 0.9 % IV BOLUS (SEPSIS)
1000.0000 mL | Freq: Once | INTRAVENOUS | Status: AC
  Administered 2016-08-31: 1000 mL via INTRAVENOUS

## 2016-08-31 MED ORDER — IOPAMIDOL (ISOVUE-370) INJECTION 76%
100.0000 mL | Freq: Once | INTRAVENOUS | Status: AC | PRN
  Administered 2016-08-31: 100 mL via INTRAVENOUS

## 2016-08-31 MED ORDER — IOPAMIDOL (ISOVUE-370) INJECTION 76%
INTRAVENOUS | Status: AC
  Filled 2016-08-31: qty 100

## 2016-08-31 MED ORDER — SENNA 8.6 MG PO TABS
1.0000 | ORAL_TABLET | Freq: Every day | ORAL | Status: DC
Start: 2016-08-31 — End: 2016-09-03
  Administered 2016-09-01 – 2016-09-02 (×2): 8.6 mg via ORAL
  Filled 2016-08-31 (×3): qty 1

## 2016-08-31 MED ORDER — ENSURE ENLIVE PO LIQD: 237.0000 mL | Freq: Three times a day (TID) | ORAL | Status: DC

## 2016-08-31 NOTE — Progress Notes (Signed)
CRITICAL VALUE ALERT  Critical Value:  Lactic acid 2.4  Date & Time Notied:  08/31/16 0200  Provider Notified: Georges Mouse  Orders Received/Actions taken: MD to come up and assess patient

## 2016-08-31 NOTE — Clinical Social Work Note (Signed)
Clinical Social Work Assessment  Patient Details  Name: Deanna Baldwin MRN: [id number] Date of Birth: [birth year]  Date of referral:  08/31/16               Reason for consult:  Facility Placement                Permission sought to share information with:  Family Supports Permission granted to share information::     Name::        Agency::     Relationship::  Aunt  Contact Information:   [phone], [phone]  Housing/Transportation Living arrangements for the past 2 months:  [place] of Information:  Patient Patient Interpreter Needed:  None Criminal Activity/Legal Involvement Pertinent to Current Situation/Hospitalization:  No - Comment as needed Significant Relationships:    Lives with:  Self Do you feel safe going back to the place where you live?  Yes Need for family participation in patient care:  Yes (Comment)  Care giving concerns:  Patient has no concerns at this time. Patient is a Long term [facility] at [facility].    Social Worker assessment / plan: CSW met with patient and her Aunt at bedside, pt. Recognize this CSW from prior visit. Patient plans to return to [facility] after her procedure is compete.  Patient gave CSW permission to update facility and her Aunt. Plan: Assist with discharge back to facility.   Employment status:  Disabled (Comment on whether or not currently receiving Disability) Insurance information:  Medicare PT Recommendations:  Not assessed at this time Information / Referral to community resources:  [place]  Patient/Family's Response to care: Patient and Aunt agreeable and appreciative of CSW services.  Patient/Family's Understanding of and Emotional Response to Diagnosis, Current Treatment, and Prognosis:  " Call and let me know when she is discharged."  Patient Aunt involved in care and supportive to patient.   Emotional Assessment Appearance:  Developmentally  appropriate Attitude/Demeanor/Rapport:    Affect (typically observed):  Accepting, Pleasant Orientation:  Oriented to Self, Oriented to Place, Oriented to  Time, Oriented to Situation Alcohol / Substance use:  Not Applicable Psych involvement (Current and /or in the community):  No (Comment)  Discharge Needs  Concerns to be addressed:  Discharge Planning Concerns Readmission within the last 30 days:  No Current discharge risk:  None Barriers to Discharge:  Continued Medical Work up   [employer], LCSW 08/31/2016, 2:06 PM

## 2016-08-31 NOTE — Progress Notes (Signed)
St 2243, patient's BP was 79/44 and HR in the 120's, temp at 98.6. On call urologist made aware and new orders were given for a 500cc bolus and telemetry. After bolus was given, BP was rechecked and it was 76/45, temp 99. On call notified again and new orders were given to check a lactic acid and give another 500cc bolus. Rechecked BO again and it was 15/45 at [place], on call urologist notified again and then NP consulted hospitalist to come and assess the patient.

## 2016-08-31 NOTE — Progress Notes (Signed)
Initial Nutrition Assessment  DOCUMENTATION CODES:   Underweight, Severe malnutrition in context of chronic illness  INTERVENTION:   Boost Breeze po TID, each supplement provides 250 kcal and 9 grams of protein  NUTRITION DIAGNOSIS:   Malnutrition (Severe) related to chronic illness (multiple sclerosis) as evidenced by 18 % wt loss in 5 months, severe depletion of body fat, and severe depletion of muscle mass.  GOAL:   Patient will meet greater than or equal to 90% of their needs  MONITOR:   PO intake, Supplement acceptance, Labs, Weight trends  REASON FOR ASSESSMENT:   Rounds ASSESSMENT:   Pt with PMH of progressive spastic MS, neurogenic bladder, and is chronically bed bound. Recently admitted 6/29 for foley catheter issues and UTI. Presents this admission with UTI, hypotension and multiple pressure injuries.   Pt currently on clear liquid diet, with a reported great appetite. Reports no issues with loss in appetite from previous admission. Pt is eating 3 meals/day and drinks 1 Ensure per/day. Suspect pt's appetite has increased after 6/29 discharge due to stable wt's shown in the chart.   Pt reports a UBW of 100 lbs, the last time being at that wt in February 2018. States after difficulties with her MS in 2/18, her appetite decreased and she could not get out of bed. Mobility has since increased, she is now getting into the wheelchair daily. Records indicate a wt loss of 18% in 5 months (102 lb in Feb to current 84 lb). This percentage in this time frame is significant for severe malnutrition.   Nutrition-Focused physical exam completed. Findings are severe fat depletion in upper arm and thoracic/lumbar regions, severe muscle depletion in upper and lower extremities, and no edema. Pt reports wounds are healing accordingly, and is aware of the importance for protein for wound healing.   Medications reviewed and include: senokot, IV abx, NS @ 125 ml/hr Labs reviewed: Albumin 2.1,  CBG 100-147  Diet Order:  Diet clear liquid Room service appropriate? Yes; Fluid consistency: Thin  Skin:   (stage 3- sacrum/ischial tuberosity, multiple foot wounds)  Last BM:  08/30/16  Height:   Ht Readings from Last 1 Encounters:  08/30/16 5\' 9"  (1.753 m)    Weight:   Wt Readings from Last 1 Encounters:  08/30/16 84 lb 14.4 oz (38.5 kg)    Ideal Body Weight:  59.3 kg (adjusted for paraplegia)  BMI:  Body mass index is 12.54 kg/m.  Estimated Nutritional Needs:   Kcal:  1500-1700 (38-44 kcal/kg)  Protein:  75-85 grams (2-2.2 g/kg)  Fluid:  >1.5 L/day   EDUCATION NEEDS:   No education needs identified at this time  [place], LDN Clinical Nutrition Pager # - [phone]

## 2016-08-31 NOTE — Consult Note (Signed)
[place] Nurse wound follow up [place] reconsulted.  Patient was seen at length yesterday.  Orders are in chart:  08/30/16: [place] Nurse wound consult note: Reason for Consult: Numerous pressure injuries complicated by moisture (mixed incontinence: urinary and fecal incontinence) Wound type:Pressure plus moisture Pressure Injury POA: Yes Measurement: Sacral:  5cm x 4cm x 0.2cm. Stage 3.  Red, moist with small amount of serous exudate. Left ischial tuberosity:  5cm x 3.5cm x 0.4cm with 80% red wound bed, 20% yellow necrotic slough. Stage 3.  Moderate amount serous exudate. Left lateral foot:  6.5cm x 2.5cm x 0.4cm.  90% red wound bed; 10% necrotic slough. Moderate amount serous exudate. Full thickness. Left lateral malleolus:  2cm round area of bark brown discoloration. Partial thickness.  Reabsorbing blister. No wound bed. Right medial heel:  0.4cm round wound with 0.5cm depth and mild undermining toward 9 o'clock.Full thickness.  Red wound bed, serous to serosanguinous exudate after exploration. Surrounding skin with evidence of previous wound healing. Left posterior heel:  3.5cm x 3.5cm Deep tissue pressure injury (DTPI) with purple/red discoloration. No exudate. Wound bed:As described above Drainage (amount, consistency, odor) As described above Periwound: Intact, macerated in the buttocks, posterior thigh, medial thigh and bilateral LE and feet areas from copious amounts of urine.  Staff will explore with Dr. Levora Dredge if a short-term indwelling urinary catheter is an option as patient has failed attempt to manage UI with a female external urinary containment device (PurWick). Dressing procedure/placement/frequency: I will provide Nursing with guidance for wound care via the orders.  Patient was placed upon a therapeutic sleep surface (mattress replacement) with low air loss feature upon arrival to the unit. Will not follow at this time.  Please re-consult if needed.  Domenic Moras RN BSN [place] Pager  [phone]

## 2016-08-31 NOTE — Consult Note (Signed)
TRH H&P   Patient Demographics:    Deanna Baldwin, is a 41 y.o. female  MRN: [id number]   DOB - [birth year]  Admit Date - 08/30/2016  Outpatient Primary MD for the patient is [name], DO  Referring MD/NP/PA: Donia Ast (urology)  Outpatient Specialists:   Hypotension, Fever No chief complaint on file.     HPI:    Deanna Baldwin  is a 41 y.o. female, w multiple sclerosis and neurogenic bladder presents w UTI, and now has hypotension, sbp 78. And spiked Temp 103+ earlier.    Pt is currently asymptomatic and on vanco and zosyn iv.  Pt notes that she has sacral decub which is healing , and small wounds on her feet. Pt denies cough, cp, palp, sob, n/v, diarrhea, brbpr, black stool.  We are consulted regarding hypotension and fever.     Review of systems:    In addition to the HPI above,   No Headache, No changes with Vision or hearing, No problems swallowing food or Liquids, No Chest pain, Cough or Shortness of Breath, No Abdominal pain, No Nausea or Vommitting, Bowel movements are regular, No Blood in stool or Urine, No dysuria, No new joints pains-aches,  No new weakness, tingling, numbness in any extremity, No recent weight gain or loss, No polyuria, polydypsia or polyphagia, No significant Mental Stressors.  A full 10 point Review of Systems was done, except as stated above, all other Review of Systems were negative.   With Past History of the following :    Past Medical History:  Diagnosis Date  . Buttock wound 03/03/2016  . Dysrhythmia    tachycardia  . MS (multiple sclerosis) ([place])   . Neutropenia ([place])   . Pneumonia 02/2016  . Protein calorie malnutrition ([place])   . Severe sepsis ([place]) 03/03/2016  . 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 UTI (urinary tract infection) 02/2016      Past Surgical History:  Procedure Laterality  Date  . NO PAST SURGERIES        Social History:     Social History  Substance Use Topics  . Smoking status: Former Smoker    Packs/day: 1.00    Years: 10.00    Types: Cigarettes    Quit date: 02/01/2010  . Smokeless tobacco: Never Used  . Alcohol use No     Lives - at home     Family History :     Family History  Problem Relation Age of Onset  . Mental illness Sister       Home Medications:   Prior to Admission medications   Medication Sig Start Date End Date Taking? Authorizing Provider  acetaminophen (TYLENOL) 325 MG tablet Take 650 mg by mouth every 6 (six) hours as needed for mild pain.    Yes [provider]  baclofen (LIORESAL) 20 MG tablet Take 20  mg by mouth 4 (four) times daily.   Yes [provider]  bisacodyl (DULCOLAX) 5 MG EC tablet Take 5 mg by mouth daily.   Yes [provider]  Cyanocobalamin (B-12 PO) Take 1 tablet by mouth daily.   Yes [provider]  ferrous sulfate 325 (65 FE) MG tablet Take 1 tablet (325 mg total) by mouth 2 (two) times daily with a meal. 04/08/16  Yes Short, Noah Delaine, MD  mirtazapine (REMERON) 7.5 MG tablet Take 7.5 mg by mouth at bedtime.   Yes [provider]  Multiple Vitamins-Minerals (DECUBI-VITE) CAPS Take 1 capsule by mouth daily.   Yes [provider]  polyethylene glycol (MIRALAX / GLYCOLAX) packet Take 17 g by mouth daily.   Yes [provider]  vitamin C (ASCORBIC ACID) 500 MG tablet Take 500 mg by mouth 2 (two) times daily.   Yes [provider]  magnesium citrate SOLN Take 30 mLs by mouth once. For surgical prep 08/31/16 08/31/16  [provider]     Allergies:    No Known Allergies   Physical Exam:   Vitals  Blood pressure (!) 79/45, pulse (!) 102, temperature 99 F (37.2 C), temperature source Oral, resp. rate 20, height 5\' 9"  (1.753 m), weight 38.5 kg (84 lb 14.4 oz), last menstrual period 01/28/2016, SpO2 96 %.   1. General   lying in bed in NAD,    2. Normal affect and insight, Not Suicidal or Homicidal, Awake Alert, Oriented X 3.  3. No F.N deficits, ALL C.Nerves Intact, Strength 5/5 all 4 extremities, Sensation intact all 4 extremities, Plantars down going.  4. Ears and Eyes appear Normal, Conjunctivae clear, PERRLA. Moist Oral Mucosa.  5. Supple Neck, No JVD, No cervical lymphadenopathy appriciated, No Carotid Bruits.  6. Symmetrical Chest wall movement, Good air movement bilaterally, CTAB.  7. tachy,S1, S2, no m/g/r    No Parasternal Heave.  8. Positive Bowel Sounds, Abdomen Soft, No tenderness, No organomegaly appriciated,No rebound -guarding or rigidity.  9.  No Cyanosis, Normal Skin Turgor, No Skin Rash or Bruise.  10. Good muscle tone,  joints appear normal , no effusions, Normal ROM.  11. No Palpable Lymph Nodes in Neck or Axillae  No splinter, no janeway, no osler,  + sacral decub    Data Review:    CBC  Recent Labs Lab 08/30/16 1430  WBC 12.2*  HGB 12.1  HCT 39.9  PLT 603*  MCV 85.6  MCH 26.0  MCHC 30.3  RDW 15.2   ------------------------------------------------------------------------------------------------------------------  Chemistries   Recent Labs Lab 08/30/16 1430 08/31/16 0112  NA 143 138  K 4.7 3.8  CL 100* 103  CO2 29 25  GLUCOSE 106* 147*  BUN 11 15  CREATININE 0.40* 0.42*  CALCIUM 9.8 8.1*  AST  --  60*  ALT  --  62*  ALKPHOS  --  134*  BILITOT  --  0.4   ------------------------------------------------------------------------------------------------------------------ estimated creatinine clearance is 56.8 mL/min (A) (by C-G formula based on SCr of 0.42 mg/dL (L)). ------------------------------------------------------------------------------------------------------------------ No results for input(s): TSH, T4TOTAL, T3FREE, THYROIDAB in the last 72 hours.  Invalid input(s): FREET3  Coagulation profile No results for input(s): INR, PROTIME in  the last 168 hours. ------------------------------------------------------------------------------------------------------------------- No results for input(s): DDIMER in the last 72 hours. -------------------------------------------------------------------------------------------------------------------  Cardiac Enzymes No results for input(s): CKMB, TROPONINI, MYOGLOBIN in the last 168 hours.  Invalid input(s): CK ------------------------------------------------------------------------------------------------------------------ No results found for: BNP   ---------------------------------------------------------------------------------------------------------------  Urinalysis    Component  Value Date/Time   COLORURINE YELLOW 07/28/2016 2125   APPEARANCEUR HAZY (A) 07/28/2016 2125   LABSPEC 1.019 07/28/2016 2125   PHURINE 6.0 07/28/2016 2125   GLUCOSEU NEGATIVE 07/28/2016 2125   HGBUR NEGATIVE 07/28/2016 2125   BILIRUBINUR NEGATIVE 07/28/2016 2125   KETONESUR NEGATIVE 07/28/2016 2125   PROTEINUR 100 (A) 07/28/2016 2125   UROBILINOGEN 0.2 06/25/2008 1551   NITRITE NEGATIVE 07/28/2016 2125   LEUKOCYTESUR LARGE (A) 07/28/2016 2125    ----------------------------------------------------------------------------------------------------------------   Imaging Results:    No results found.     Assessment & Plan:    Principal Problem:   Hypotension Active Problems:   UTI (urinary tract infection)   Neurogenic bladder   Sepsis (HCC)   Fever, unspecified    Hypotension Check cortisol Check trop I q6h x3 Check d dimer If d dimer is postive then CTA chest r/o PE Check cardiac echo  Fever likely secondary to sepsis + tachycardia, + lactic acid elevation, + hypotension CXR pending RUQ ultrasound Blood culture x 2 ending.  Urine culture pending  UTI Cont vanco, cont zosyn iv   Neurogenic bladder Urology following  Ms Stable   DVT Prophylaxis Lovenox SCDs    AM Labs Ordered, also please review Full Orders  Family Communication: Admission, patients condition and plan of care including tests being ordered have been discussed with the patient FULL CODEwho indicate understanding and agree with the plan and Code Status.  Code Status FULL CODE  Likely DC to  home  Condition GUARDED   Admission status: inpatient    Time spent in minutes : 45 minutes   Jani Gravel M.D on 08/31/2016 at 2:36 AM  Between 7am to 7pm - Pager - [phone]   After 7pm go to [email or web address] - password [facility]  [facility] - Office  [phone]

## 2016-08-31 NOTE — Progress Notes (Signed)
CONSULTATION PROGRESS NOTE  Deanna Baldwin  ZOX:[id number] DOB: [birth year] DOA: 08/30/2016 PCP: [name], DO   Brief Narrative: Deanna Baldwin is a 41 y.o. female with a history of MS, spastic paraplegia, neurogenic bladder, and sacral decubitus ulcer who was admitted by urology for ileal conduit diversion. Overnight, the patient developed tachycardia, hypotension and fever to 103.49F without any subjective complaints. Hospitalists were consulted for evaluation and management. Vancomycin and zosyn started empirically, cultures obtained. D-dimer was positive and subsequent CTA negative for PE, though demonstrated LLL infiltrate consistent with early pneumonia.  Interim History/Subjective: Pt received 2L NS bolus with some improvement in blood pressure, but has continued to mentate normally and complains of no symptoms. Feels well this morning. Says her sacral decubitus and feet wounds have been improving.   Assessment & Plan: Sepsis due to LLL pneumonia: No significant symptoms, but infiltrate seen on CTA chest. Also possibly urinary source, which would be recurrent (culture pending). Sacral decubitus appears not infected.  - Currently appears nontoxic. Hypotension and sinus tachycardia are noted to be chronic, noted during last admission. Recent outpatient BPs are 100/60, 94/52. Continue maintenance IVF's.  - Cortisol ok, troponins negative, repeat lactic acid - Adding on urinalysis - Continue broad spectrum antibiotics pending urine culture and blood cultures.  - Echocardiogram ordered  Neurogenic bladder:  - Urology planning ileal conduit diversion, operation postponed given sepsis.  Advanced multiple sclerosis: with spastic paraplegia.  - Anticipate return to [facility] at discharge. CSW aware.   Sacral decubitus ulcer: Improving by report. Does not appear infected - WOC consult  Severe protein calorie malnutrition:  - Nutrition consulted  Objective: Vitals:   08/31/16 0010  08/31/16 0127 08/31/16 0318 08/31/16 0353  BP: (!) 76/45 (!) 79/45 (!) 86/47 (!) 95/51  Pulse: (!) 102  (!) 110 (!) 116  Resp:   16 16  Temp: 99 F (37.2 C)  100.2 F (37.9 C) 100.3 F (37.9 C)  TempSrc: Oral  Oral Oral  SpO2:   100% 98%  Weight:      Height:        Intake/Output Summary (Last 24 hours) at 08/31/16 1413 Last data filed at 08/31/16 0600  Gross per 24 hour  Intake          3763.33 ml  Output                0 ml  Net          3763.33 ml   Filed Weights   08/30/16 1214  Weight: 38.5 kg (84 lb 14.4 oz)    Examination: Gen: Frail, thin 41 y.o. female in no distress Pulm: Non-labored on room air. Clear to auscultation bilaterally.  CV: Regular tachycardia without murmur. No JVD. GI: Abdomen soft, scaphoid, no tenderness with attention to suprapubic area, non-distended, +BS.  Neuro: Alert and oriented. Legs contracted, limited control over hip rotation but remains flexed.  Ext: Flexion contracture deformities of bilateral LE's, feet in prevalon boots. Significantly reduced muscle bulk. Skin: Sacral decubitus ulcer with serosanguinous discharge, moist wound bed, no purulence or odor. No fluctuance or induration.  CBC:  Recent Labs Lab 08/30/16 1430 08/31/16 0259  WBC 12.2* 9.7  HGB 12.1 9.0*  HCT 39.9 29.4*  MCV 85.6 83.8  PLT 603* 811*   Basic Metabolic Panel:  Recent Labs Lab 08/30/16 1430 08/31/16 0112  NA 143 138  K 4.7 3.8  CL 100* 103  CO2 29 25  GLUCOSE 106* 147*  BUN 11 15  CREATININE  0.40* 0.42*  CALCIUM 9.8 8.1*   GFR: Estimated Creatinine Clearance: 56.8 mL/min (A) (by C-G formula based on SCr of 0.42 mg/dL (L)).  Cardiac Enzymes:  Recent Labs Lab 08/31/16 0259 08/31/16 1155  TROPONINI <0.03 <0.03   Radiology: 08/31/2016 CT ANGIOGRAPHY CHEST WITH CONTRAST  1.  No demonstrable pulmonary embolus.  2.  Atelectasis with probable early pneumonia lateral left base.  3. Mild central peribronchial thickening. Suspect chronic  bronchitis.  4.  Small pleural effusions bilaterally.  5.  No demonstrable adenopathy.  US Abdomen Limited Ruq  08/31/2016 RUQ U/S Normal RIGHT upper quadrant ultrasound. CBD 69mm  Time spent: 25 minutes.  Vance Gather, MD [facility] Pager [phone]

## 2016-08-31 NOTE — Progress Notes (Signed)
PHARMACY - PHYSICIAN COMMUNICATION CRITICAL VALUE ALERT - BLOOD CULTURE IDENTIFICATION (BCID)  Results for orders placed or performed during the hospital encounter of 08/30/16  Blood Culture ID Panel (Reflexed) (Collected: 08/30/2016  7:16 PM)  Result Value Ref Range   Enterococcus species NOT DETECTED NOT DETECTED   Listeria monocytogenes NOT DETECTED NOT DETECTED   Staphylococcus species DETECTED (A) NOT DETECTED   Staphylococcus aureus NOT DETECTED NOT DETECTED   Methicillin resistance DETECTED (A) NOT DETECTED   Streptococcus species NOT DETECTED NOT DETECTED   Streptococcus agalactiae NOT DETECTED NOT DETECTED   Streptococcus pneumoniae NOT DETECTED NOT DETECTED   Streptococcus pyogenes NOT DETECTED NOT DETECTED   Acinetobacter baumannii NOT DETECTED NOT DETECTED   Enterobacteriaceae species NOT DETECTED NOT DETECTED   Enterobacter cloacae complex NOT DETECTED NOT DETECTED   Escherichia coli NOT DETECTED NOT DETECTED   Klebsiella oxytoca NOT DETECTED NOT DETECTED   Klebsiella pneumoniae NOT DETECTED NOT DETECTED   Proteus species NOT DETECTED NOT DETECTED   Serratia marcescens NOT DETECTED NOT DETECTED   Haemophilus influenzae NOT DETECTED NOT DETECTED   Neisseria meningitidis NOT DETECTED NOT DETECTED   Pseudomonas aeruginosa NOT DETECTED NOT DETECTED   Candida albicans NOT DETECTED NOT DETECTED   Candida glabrata NOT DETECTED NOT DETECTED   Candida krusei NOT DETECTED NOT DETECTED   Candida parapsilosis NOT DETECTED NOT DETECTED   Candida tropicalis NOT DETECTED NOT DETECTED    Name of physician (or Provider) Contacted: K. Schorr  Changes to prescribed antibiotics required: continue vancomycin  Lynelle Doctor 08/31/2016  7:33 PM

## 2016-08-31 NOTE — Progress Notes (Signed)
UROLOGY PROGRESS NOTES  Assessment:  Deanna Baldwin is a 41 y.o. female with a history of progressive MS, neurogenic bladder, decubitus ulcers and eroded bladder neck. She was admitted 7/30, preadmitted prior to planned ileal conduit urinary diversion for bowel prep and antibiotics.   Interval:  On arrival, patient began spiking fevers as high as 103, with tachycardia, hypotension. Alert, appropriately oriented. Blood and urine cultures obtained, vanc/zosyn initiated. Hypotension continued throughout evening despite normal saline boluses as did low grade fevers. Lactate 2.4. Troponin negative. Hospitalist consulted, imaging pending (echo, CT chest PE, RUQ [place]).   Patient with possible sepsis from urine versus decubitus ulcers. Surgery Wednesday cancelled, at least postponed until later in week or until patient is stable and source of hemodynamic instability parsed out.   Plan:   Neuro: - tylenol PRN for pain   Respiratory: SORA - CT chest PE protocol pending per medicine due to elevated D Dimer   CV: HDS - telemetry for hypotension - echo pending  - trop x 3 pending, 1st negative   FEN/GI: - mIVF @ 125, clears  - bowel regimen  - RUQ [place] pending  - entereg prior to surgery   GU: - monitor UOP - suction external Foley catheter in place    Endo: - cortisol pending given hypotension   Heme/ID: - blood, urine cultures pending  - vanc/zosyn  - appreciate hospitalist assistance with care   PPx:  - clinatron bed  - [facility]  - contact precautions  - WOCN for decubitus ulcers and stoma marking   Dispo: will likely postpone surgery to later in week given sepsis picture   Jonna Clark, MD Urologic Surgery Resident  -------   Subjective: Uncomfortable bed. Feeling well this AM, better than last night. Hungry.   Objective:  Vital signs in last 24 hours: Temp:  [98.9 F (37.2 C)-103.2 F (39.6 C)] 100.3 F (37.9 C) (07/31 0353) Pulse Rate:  [102-128] 116 (07/31  0353) Resp:  [16-21] 16 (07/31 0353) BP: (76-103)/(44-73) 95/51 (07/31 0353) SpO2:  [96 %-100 %] 98 % (07/31 0353) Weight:  [38.5 kg (84 lb 14.4 oz)] 38.5 kg (84 lb 14.4 oz) (07/30 1214)  07/30 0701 - 07/31 0700 In: 3763.3 [I.V.:1513.3; IV Piggyback:250] Out: -    Physical Exam:  General:  well-developed and well-nourished  in NAD, lying in bed, alert & oriented, pleasant HEENT: [place]/AT, EOMI, sclera anicteric, hearing grossly intact, no nasal discharge, MMM Respiratory: nonlabored respirations, satting well on RA, symmetrical chest rise Cardiovascular: appropriate perfusion  Abdominal: soft, NTTP, nondistended. Stoma marking in place GU: foley catheter in place to suction   Extremities: warm, well-perfused, no c/c/e Neuro: no focal deficits  Data Review: Results for orders placed or performed during the hospital encounter of 08/30/16 (from the past 24 hour(s))  Surgical pcr screen     Status: Abnormal   Collection Time: 08/30/16  1:03 PM  Result Value Ref Range   MRSA, PCR POSITIVE (A) NEGATIVE   Staphylococcus aureus POSITIVE (A) NEGATIVE  Basic metabolic panel     Status: Abnormal   Collection Time: 08/30/16  2:30 PM  Result Value Ref Range   Sodium 143 135 - 145 mmol/L   Potassium 4.7 3.5 - 5.1 mmol/L   Chloride 100 (L) 101 - 111 mmol/L   CO2 29 22 - 32 mmol/L   Glucose, Bld 106 (H) 65 - 99 mg/dL   BUN 11 6 - 20 mg/dL   Creatinine, Ser 0.40 (L) 0.44 - 1.00 mg/dL  Calcium 9.8 8.9 - 10.3 mg/dL   GFR calc non Af Amer >60 >60 mL/min   GFR calc Af Amer >60 >60 mL/min   Anion gap 14 5 - 15  CBC     Status: Abnormal   Collection Time: 08/30/16  2:30 PM  Result Value Ref Range   WBC 12.2 (H) 4.0 - 10.5 K/uL   RBC 4.66 3.87 - 5.11 MIL/uL   Hemoglobin 12.1 12.0 - 15.0 g/dL   HCT 39.9 36.0 - 46.0 %   MCV 85.6 78.0 - 100.0 fL   MCH 26.0 26.0 - 34.0 pg   MCHC 30.3 30.0 - 36.0 g/dL   RDW 15.2 11.5 - 15.5 %   Platelets 603 (H) 150 - 400 K/uL  Type and screen     Status:  None   Collection Time: 08/30/16  7:16 PM  Result Value Ref Range   ABO/RH(D) AB POS    Antibody Screen NEG    Sample Expiration 09/02/2016   Comprehensive metabolic panel     Status: Abnormal   Collection Time: 08/31/16  1:12 AM  Result Value Ref Range   Sodium 138 135 - 145 mmol/L   Potassium 3.8 3.5 - 5.1 mmol/L   Chloride 103 101 - 111 mmol/L   CO2 25 22 - 32 mmol/L   Glucose, Bld 147 (H) 65 - 99 mg/dL   BUN 15 6 - 20 mg/dL   Creatinine, Ser 0.42 (L) 0.44 - 1.00 mg/dL   Calcium 8.1 (L) 8.9 - 10.3 mg/dL   Total Protein 5.7 (L) 6.5 - 8.1 g/dL   Albumin 2.1 (L) 3.5 - 5.0 g/dL   AST 60 (H) 15 - 41 U/L   ALT 62 (H) 14 - 54 U/L   Alkaline Phosphatase 134 (H) 38 - 126 U/L   Total Bilirubin 0.4 0.3 - 1.2 mg/dL   GFR calc non Af Amer >60 >60 mL/min   GFR calc Af Amer >60 >60 mL/min   Anion gap 10 5 - 15  Lactic acid, plasma     Status: Abnormal   Collection Time: 08/31/16  1:12 AM  Result Value Ref Range   Lactic Acid, Venous 2.4 (HH) 0.5 - 1.9 mmol/L  Troponin I (q 6hr x 3)     Status: None   Collection Time: 08/31/16  2:59 AM  Result Value Ref Range   Troponin I <0.03 <0.03 ng/mL  CBC     Status: Abnormal   Collection Time: 08/31/16  2:59 AM  Result Value Ref Range   WBC 9.7 4.0 - 10.5 K/uL   RBC 3.51 (L) 3.87 - 5.11 MIL/uL   Hemoglobin 9.0 (L) 12.0 - 15.0 g/dL   HCT 29.4 (L) 36.0 - 46.0 %   MCV 83.8 78.0 - 100.0 fL   MCH 25.6 (L) 26.0 - 34.0 pg   MCHC 30.6 30.0 - 36.0 g/dL   RDW 15.1 11.5 - 15.5 %   Platelets 457 (H) 150 - 400 K/uL  D-dimer, quantitative (not at [facility])     Status: Abnormal   Collection Time: 08/31/16  2:59 AM  Result Value Ref Range   D-Dimer, Quant 5.37 (H) 0.00 - 0.50 ug/mL-FEU    Imaging:  Pending

## 2016-08-31 NOTE — Progress Notes (Signed)
  Echocardiogram 2D Echocardiogram has been performed.  Hezzie Karim L Androw 08/31/2016, 3:48 PM

## 2016-09-01 DIAGNOSIS — G35 Multiple sclerosis: Secondary | ICD-10-CM

## 2016-09-01 LAB — BASIC METABOLIC PANEL
Anion gap: 10 (ref 5–15)
BUN: <5 mg/dL — ABNORMAL LOW (ref 6–20)
CALCIUM: 8.7 mg/dL — AB (ref 8.9–10.3)
CO2: 26 mmol/L (ref 22–32)
Chloride: 105 mmol/L (ref 101–111)
GLUCOSE: 158 mg/dL — AB (ref 65–99)
Potassium: 3.6 mmol/L (ref 3.5–5.1)
Sodium: 141 mmol/L (ref 135–145)

## 2016-09-01 LAB — CBC
HCT: 26.9 % — ABNORMAL LOW (ref 36.0–46.0)
Hemoglobin: 8.3 g/dL — ABNORMAL LOW (ref 12.0–15.0)
MCH: 26 pg (ref 26.0–34.0)
MCHC: 30.9 g/dL (ref 30.0–36.0)
MCV: 84.3 fL (ref 78.0–100.0)
PLATELETS: 502 10*3/uL — AB (ref 150–400)
RBC: 3.19 MIL/uL — ABNORMAL LOW (ref 3.87–5.11)
RDW: 15.2 % (ref 11.5–15.5)
WBC: 6.9 10*3/uL (ref 4.0–10.5)

## 2016-09-01 LAB — URINE CULTURE

## 2016-09-01 LAB — URINALYSIS, ROUTINE W REFLEX MICROSCOPIC
BACTERIA UA: NONE SEEN
Bilirubin Urine: NEGATIVE
Glucose, UA: NEGATIVE mg/dL
Ketones, ur: NEGATIVE mg/dL
NITRITE: NEGATIVE
PROTEIN: NEGATIVE mg/dL
Specific Gravity, Urine: 1.006 (ref 1.005–1.030)
pH: 7 (ref 5.0–8.0)

## 2016-09-01 LAB — CREATININE, SERUM

## 2016-09-01 LAB — LACTIC ACID, PLASMA: Lactic Acid, Venous: 0.9 mmol/L (ref 0.5–1.9)

## 2016-09-01 MED ORDER — PIPERACILLIN-TAZOBACTAM 3.375 G IVPB
3.3750 g | Freq: Three times a day (TID) | INTRAVENOUS | Status: DC
  Administered 2016-09-01 – 2016-09-04 (×7): 3.375 g via INTRAVENOUS
  Filled 2016-09-01 (×5): qty 50

## 2016-09-01 MED ORDER — ALBUMIN HUMAN 25 % IV SOLN
12.5000 g | Freq: Four times a day (QID) | INTRAVENOUS | Status: AC
  Administered 2016-09-01 – 2016-09-02 (×4): 12.5 g via INTRAVENOUS
  Filled 2016-09-01 (×5): qty 50

## 2016-09-01 MED ORDER — SODIUM CHLORIDE 0.9 % IV BOLUS (SEPSIS)
500.0000 mL | Freq: Once | INTRAVENOUS | Status: AC
  Administered 2016-09-01: 500 mL via INTRAVENOUS

## 2016-09-01 MED ORDER — ENSURE ENLIVE PO LIQD
237.0000 mL | Freq: Three times a day (TID) | ORAL | Status: DC
  Administered 2016-09-01 – 2016-09-02 (×2): 237 mL via ORAL

## 2016-09-01 MED ORDER — SODIUM CHLORIDE 0.45 % IV BOLUS
500.0000 mL | Freq: Once | INTRAVENOUS | Status: AC
  Administered 2016-09-01: 500 mL via INTRAVENOUS

## 2016-09-01 MED ORDER — POLYETHYLENE GLYCOL 3350 17 G PO PACK
17.0000 g | PACK | Freq: Two times a day (BID) | ORAL | Status: DC
  Administered 2016-09-01 – 2016-09-02 (×3): 17 g via ORAL
  Filled 2016-09-01 (×4): qty 1

## 2016-09-01 MED ORDER — ALVIMOPAN 12 MG PO CAPS
12.0000 mg | ORAL_CAPSULE | ORAL | Status: DC
  Filled 2016-09-01: qty 1

## 2016-09-01 NOTE — Progress Notes (Signed)
UROLOGY PROGRESS NOTES  Assessment:  Deanna Baldwin is a 41 y.o. female with a history of progressive MS, neurogenic bladder, decubitus ulcers and eroded bladder neck. She was admitted 7/30, preadmitted prior to planned ileal conduit urinary diversion for bowel prep and antibiotics. On arrival, patient was febrile, tachycardic and hypotensive. Vanc/zosyn initiated, lactate 2.4. Concern for sepsis from unknown origin, thus postponed surgery and consutled hospitalist service.   Interval:  Chest CT demonstrated concern for early left lateral pneumonia. No pulmonary embolus, RUQ [place] normal. Blood cultures x 2 positive, MRSA bacteremia from unknown origin. Urine culture negative. Repeat lactate this AM wnl. Tropnins nl, echo nl. Afebrile on vanc/zosyn.     Plan:   Neuro: - tylenol PRN for pain   Respiratory: SORA - treatment of pneumonia per medicine, continuing zosyn for now   CV: HDS - telemetry for hypotension    FEN/GI: - mIVF @ 125, regular diet  - bowel regimen  - start bowel clean out with miralax BID - entereg prior to surgery   GU: - monitor UOP - suction external Foley catheter in place - ucx negative     Heme/ID: MRSA bacterermia - continue vanc  - repeat blood cultures today  - appreciate hospitalist assistance with care   PPx:  - clinatron bed  - [facility]  - contact precautions  - WOCN for decubitus ulcers and stoma marking   Dispo: if repeat blood cultures negative, will proceed with surgery on Friday   Jonna Clark, MD Urologic Surgery Resident  -------   Objective:  Vital signs in last 24 hours: Temp:  [99.4 F (37.4 C)-99.6 F (37.6 C)] 99.4 F (37.4 C) (08/01 0420) Pulse Rate:  [104-116] 104 (08/01 0420) Resp:  [18] 18 (08/01 0420) BP: (95-101)/(50-54) 101/54 (08/01 0420) SpO2:  [100 %] 100 % (08/01 0420)  07/31 0701 - 08/01 0700 In: 3300 [I.V.:3000; IV Piggyback:300] Out: 50 [Urine:50]   Physical Exam:  General:  well-developed and  well-nourished  in NAD, lying in bed, alert & oriented, pleasant HEENT: /AT, EOMI, sclera anicteric, hearing grossly intact, no nasal discharge, MMM Respiratory: nonlabored respirations, satting well on RA, symmetrical chest rise Cardiovascular: appropriate perfusion  Abdominal: soft, NTTP, nondistended. Stoma marking in place GU: foley catheter in place to suction   Extremities: warm, well-perfused, no c/c/e Neuro: no focal deficits  Data Review: Results for orders placed or performed during the hospital encounter of 08/30/16 (from the past 24 hour(s))  Troponin I (q 6hr x 3)     Status: None   Collection Time: 08/31/16  2:49 PM  Result Value Ref Range   Troponin I <0.03 <0.03 ng/mL  Urinalysis, Routine w reflex microscopic     Status: Abnormal   Collection Time: 09/01/16  4:18 AM  Result Value Ref Range   Color, Urine STRAW (A) YELLOW   APPearance CLEAR CLEAR   Specific Gravity, Urine 1.006 1.005 - 1.030   pH 7.0 5.0 - 8.0   Glucose, UA NEGATIVE NEGATIVE mg/dL   Hgb urine dipstick SMALL (A) NEGATIVE   Bilirubin Urine NEGATIVE NEGATIVE   Ketones, ur NEGATIVE NEGATIVE mg/dL   Protein, ur NEGATIVE NEGATIVE mg/dL   Nitrite NEGATIVE NEGATIVE   Leukocytes, UA LARGE (A) NEGATIVE   RBC / HPF 6-30 0 - 5 RBC/hpf   WBC, UA 6-30 0 - 5 WBC/hpf   Bacteria, UA NONE SEEN NONE SEEN   Squamous Epithelial / LPF 0-5 (A) NONE SEEN   Mucous PRESENT   Creatinine, serum  Status: Abnormal   Collection Time: 09/01/16  4:54 AM  Result Value Ref Range   Creatinine, Ser <0.30 (L) 0.44 - 1.00 mg/dL   GFR calc non Af Amer NOT CALCULATED >60 mL/min   GFR calc Af Amer NOT CALCULATED >60 mL/min  CBC     Status: Abnormal   Collection Time: 09/01/16  4:54 AM  Result Value Ref Range   WBC 6.9 4.0 - 10.5 K/uL   RBC 3.19 (L) 3.87 - 5.11 MIL/uL   Hemoglobin 8.3 (L) 12.0 - 15.0 g/dL   HCT 26.9 (L) 36.0 - 46.0 %   MCV 84.3 78.0 - 100.0 fL   MCH 26.0 26.0 - 34.0 pg   MCHC 30.9 30.0 - 36.0 g/dL   RDW  15.2 11.5 - 15.5 %   Platelets 502 (H) 150 - 400 K/uL  Lactic acid, plasma     Status: None   Collection Time: 09/01/16  4:54 AM  Result Value Ref Range   Lactic Acid, Venous 0.9 0.5 - 1.9 mmol/L  Basic metabolic panel     Status: Abnormal   Collection Time: 09/01/16 10:25 AM  Result Value Ref Range   Sodium 141 135 - 145 mmol/L   Potassium 3.6 3.5 - 5.1 mmol/L   Chloride 105 101 - 111 mmol/L   CO2 26 22 - 32 mmol/L   Glucose, Bld 158 (H) 65 - 99 mg/dL   BUN <5 (L) 6 - 20 mg/dL   Creatinine, Ser <0.30 (L) 0.44 - 1.00 mg/dL   Calcium 8.7 (L) 8.9 - 10.3 mg/dL   GFR calc non Af Amer NOT CALCULATED >60 mL/min   GFR calc Af Amer NOT CALCULATED >60 mL/min   Anion gap 10 5 - 15    Imaging:  Pending

## 2016-09-01 NOTE — NC FL2 (Addendum)
[place] MEDICAID FL2 LEVEL OF CARE SCREENING TOOL     IDENTIFICATION  Patient Name: Deanna Baldwin Birthdate: 1975-03-22 Sex: female Admission Date (Current Location): 08/30/2016  [facility] and [place] Number:  Herbalist and Address:  [facility],  [place], [place]      Provider Number: [id number]  Attending Physician Name and Address:  Ardis Hughs, MD  Relative Name and Phone Number:       Current Level of Care: Hospital Recommended Level of Care: [place] Prior Approval Number:    Date Approved/Denied:   PASRR Number: [id number]  Discharge Plan: SNF    Current Diagnoses: Patient Active Problem List   Diagnosis Date Noted  . Fever, unspecified 08/31/2016  . Hypotension 08/31/2016  . Pressure injury of skin 08/31/2016  . Abnormal liver function   . Vitamin B 12 deficiency 07/09/2016  . Sepsis due to urinary tract infection ([place])   . Pressure ulcer, stage 4 ([place]) 07/08/2016  . Foley catheter problem ([place])   . Sepsis ([place]) 07/07/2016  . Unintentional weight loss 07/06/2016  . Relapsing remitting multiple sclerosis ([place]) 06/08/2016  . 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 Neurogenic bladder 04/09/2016  . Tachycardia 04/08/2016  . Anemia of chronic disease 04/06/2016  . Dysphagia, oral phase 03/17/2016  . Neutropenia ([place])   . Muscle abscess   . Myofascitis   . Aspiration pneumonia of left lower lobe due to vomit ([place])   . Sacral decubitus ulcer, stage III ([place])   . Multiple sclerosis ([place])   . Protein-calorie malnutrition, severe ([place])   . Hypokalemia 03/05/2016  . Hypernatremia   . Pressure ulcer, stage 3 ([place]) 03/03/2016  . 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 Community acquired pneumonia 03/02/2016  . UTI (urinary tract infection) 03/02/2016  . Abnormal LFTs 03/02/2016    Orientation RESPIRATION BLADDER Height & Weight     Self, Time, Situation,  Place  Normal Incontinent, External catheter Weight: 84 lb 14.4 oz (38.5 kg) Height:  5\' 9"  (175.3 cm)  BEHAVIORAL SYMPTOMS/MOOD NEUROLOGICAL BOWEL NUTRITION STATUS        Diet (Regular)  AMBULATORY STATUS COMMUNICATION OF NEEDS Skin   Total Care Verbally Other (Comment)     Pressure Injury 08/30/16 Stage III  Full thickness tissue loss. Subcutaneous fat may be visible but bone, tendon or muscle are NOT exposed. open skin redness and bloody drainage Location: Sacrum Location Orientation: Medial  Alginate;ABD Gauze changed 2x/day    Pressure Injury 08/30/16 Stage III -  Full thickness tissue loss. Subcutaneous fat may be visible but bone, tendon or muscle are NOT exposed. Full skin loose with red/yellowish drainage Location: Ischial tuberosity Location Orientation: Left ABD Gauze Changed twice a day    Pressure Injury 08/30/16 Stage III -  Full thickness tissue loss. Subcutaneous fat may be visible but bone, tendon or muscle are NOT exposed.   Location: Foot Location Orientation: Left; Lateral ABD Gauze changed twice a day    Pressure Injury 08/30/16 Deep Tissue Injury - Purple or maroon localized area of discolored intact skin or blood-filled blister due to damage of underlying soft tissue from pressure and/or shear. Location: Heel Location Orientation: Left Lateral Foam changed twice a day     Pressure Injury 08/30/16 Unstageable - Full thickness tissue loss in which the base of the ulcer is covered by slough (yellow, tan, gray, green or brown) and/or eschar (tan, brown or black) in the wound bed. Location: Right  Heel Foam changed twice a day        Pressure Injury 07/08/16 Stage IV - Full thickness tissue loss with exposed bone, tendon or muscle. upper and lower sacrum with chronic stage 4 pressure injuries  Location: upper and lower Sacrum                       Personal Care Assistance Level of Assistance  Bathing, Feeding, Dressing Bathing Assistance: Maximum assistance Feeding  assistance: Maximum assistance Dressing Assistance: Maximum assistance     Functional Limitations Info             SPECIAL CARE FACTORS FREQUENCY                       Contractures      Additional Factors Info  Code Status, Allergies, Isolation Precautions Code Status Info: Full Code Allergies Info: NKA     Isolation Precautions Info: Contact precautions; Infection MRSA     Current Medications (09/01/2016):  This is the current hospital active medication list Current Facility-Administered Medications  Medication Dose Route Frequency Provider Last Rate Last Dose  . 0.45 % sodium chloride infusion   Intravenous Continuous Ardis Hughs, MD 125 mL/hr at 09/01/16 0511    . acetaminophen (TYLENOL) tablet 650 mg  650 mg Oral Q6H PRN Ardis Hughs, MD   650 mg at 08/30/16 1849  . [START ON 09/02/2016] alvimopan (ENTEREG) capsule 12 mg  12 mg Oral On Call to OR Ardis Hughs, MD      . chlorhexidine (PERIDEX) 0.12 % solution 15 mL  15 mL Mouth Rinse BID Ardis Hughs, MD   15 mL at 09/01/16 0912  . Chlorhexidine Gluconate Cloth 2 % PADS 6 each  6 each Topical Q0600 Ardis Hughs, MD   6 each at 09/01/16 [phone]  . docusate sodium (COLACE) capsule 100 mg  100 mg Oral BID Filippou, Braxton Feathers, MD   100 mg at 09/01/16 0912  . feeding supplement (ENSURE ENLIVE) (ENSURE ENLIVE) liquid 237 mL  237 mL Oral TID BM Ardis Hughs, MD   237 mL at 09/01/16 1000  . heparin injection 5,000 Units  5,000 Units Subcutaneous Q8H Ardis Hughs, MD   5,000 Units at 09/01/16 [phone]  . MEDLINE mouth rinse  15 mL Mouth Rinse q12n4p Ardis Hughs, MD   15 mL at 08/31/16 1647  . mupirocin ointment (BACTROBAN) 2 % 1 application  1 application Nasal BID Ardis Hughs, MD   1 application at 77/41/28 0913  . piperacillin-tazobactam (ZOSYN) IVPB 3.375 g  3.375 g Intravenous Q8H Ardis Hughs, MD 12.5 mL/hr at 09/01/16 0930 3.375 g at 09/01/16 0930  . senna  (SENOKOT) tablet 8.6 mg  1 tablet Oral QHS Filippou, Braxton Feathers, MD      . vancomycin (VANCOCIN) IVPB 750 mg/150 ml premix  750 mg Intravenous Q24H Ardis Hughs, MD   Stopped at 08/31/16 2112     Discharge Medications: Please see discharge summary for a list of discharge medications.  Relevant Imaging Results:  Relevant Lab Results:   Additional Information SSN: [id number]  Will write antibiotic rx for 3 days of peri-stent pull antibiotics, to start day before follow up appointment. Will follow up for staple removal, stent removal, and PO check on 8/21 at 9:45 am.  Burnis Medin, LCSW

## 2016-09-01 NOTE — Progress Notes (Signed)
PROGRESS NOTE    Deanna Baldwin  DEY:[id number] DOB: 26-Apr-1975 DOA: 08/30/2016 PCP: [name], DO   Brief Narrative:   41 year old female with past medical history of multiple sclerosis and neurogenic bladder presented to the ER with UTI/urosepsis. Patient was started on vancomycin and Zosyn and admitted to urology service. Medicine team was consulted due to hypotension and fever.  Assessment & Plan:   Principal Problem:   Hypotension Active Problems:   UTI (urinary tract infection)   Neurogenic bladder   Sepsis (HCC)   Fever, unspecified   Abnormal liver function   Pressure injury of skin  Hypotension/fever Sepsis secondary to likely urinary tract infection -Cultures have been ordered and one of the cultures grew Staphylococcus hominis. Repeat cultures have been ordered again today -Continue vancomycin and Zosyn -CTA of the chest done 7/31- was negative for pulmonary embolism, but showed atelectasis with possible early pneumonia and chronic bronchitis. -A.m. cortisol normal-15 -Echocardiogram done on 08/31/2016 shows normal ejection fraction -Her current blood pressure of around systolic [phone] is at her baseline. Closely monitor urine output. Will order one time 500cc bolus.   History of neurogenic bladder -This is likely secondary to her multiple sclerosis. Management per urology  Multiple Sclerosis- stable   Moderate to severe protein calorie malnutrition -Encourage oral intake. Patient states she has always been a heavy eater but despite of that does not gain weight as she was stable she is a very high metabolism  DVT prophylaxis: Heparin Code Status: Full code Family Communication:  None at bedside Disposition Plan: Plans for OR prior surgery   Antimicrobials:   Vanc   Zosyn   Subjective: No complaints. States she feels better.  Reports her BP is always on borderline low side given her chronic disease.   Objective: Vitals:   08/31/16 0353 08/31/16  2124 09/01/16 0420 09/01/16 1501  BP: (!) 95/51 (!) 95/50 (!) 101/54 (!) 90/54  Pulse: (!) 116 (!) 116 (!) 104 (!) 113  Resp: 16 18 18 20   Temp: 100.3 F (37.9 C) 99.6 F (37.6 C) 99.4 F (37.4 C) 98 F (36.7 C)  TempSrc: Oral Oral Oral Oral  SpO2: 98% 100% 100% 97%  Weight:      Height:        Intake/Output Summary (Last 24 hours) at 09/01/16 1542 Last data filed at 09/01/16 1330  Gross per 24 hour  Intake          3322.08 ml  Output             1250 ml  Net          2072.08 ml   Filed Weights   08/30/16 1214  Weight: 38.5 kg (84 lb 14.4 oz)    Examination:  General exam: Appears calm and comfortable. Cachectic, thin apperaring Respiratory system: Clear to auscultation. Respiratory effort normal. Cardiovascular system: S1 & S2 heard, RRR. No JVD, murmurs, rubs, gallops or clicks. No pedal edema. Gastrointestinal system: Abdomen is nondistended, soft and nontender. No organomegaly or masses felt. Normal bowel sounds heard. Central nervous system: Alert and oriented. No focal neurological deficits. Extremities: Symmetric 5 x 5 power. Skin: No rashes, lesions or ulcers Psychiatry: Judgement and insight appear normal. Mood & affect appropriate.     Data Reviewed:   CBC:  Recent Labs Lab 08/30/16 1430 08/31/16 0259 09/01/16 0454  WBC 12.2* 9.7 6.9  HGB 12.1 9.0* 8.3*  HCT 39.9 29.4* 26.9*  MCV 85.6 83.8 84.3  PLT 603* 457* 502*   Basic  Metabolic Panel:  Recent Labs Lab 08/30/16 1430 08/31/16 0112 09/01/16 0454 09/01/16 1025  NA 143 138  --  141  K 4.7 3.8  --  3.6  CL 100* 103  --  105  CO2 29 25  --  26  GLUCOSE 106* 147*  --  158*  BUN 11 15  --  <5*  CREATININE 0.40* 0.42* <0.30* <0.30*  CALCIUM 9.8 8.1*  --  8.7*   GFR: CrCl cannot be calculated (This lab value cannot be used to calculate CrCl because it is not a number: <0.30). Liver Function Tests:  Recent Labs Lab 08/31/16 0112  AST 60*  ALT 62*  ALKPHOS 134*  BILITOT 0.4  PROT  5.7*  ALBUMIN 2.1*   No results for input(s): LIPASE, AMYLASE in the last 168 hours. No results for input(s): AMMONIA in the last 168 hours. Coagulation Profile: No results for input(s): INR, PROTIME in the last 168 hours. Cardiac Enzymes:  Recent Labs Lab 08/31/16 0259 08/31/16 1155 08/31/16 1449  TROPONINI <0.03 <0.03 <0.03   BNP (last 3 results) No results for input(s): PROBNP in the last 8760 hours. HbA1C: No results for input(s): HGBA1C in the last 72 hours. CBG: No results for input(s): GLUCAP in the last 168 hours. Lipid Profile: No results for input(s): CHOL, HDL, LDLCALC, TRIG, CHOLHDL, LDLDIRECT in the last 72 hours. Thyroid Function Tests: No results for input(s): TSH, T4TOTAL, FREET4, T3FREE, THYROIDAB in the last 72 hours. Anemia Panel: No results for input(s): VITAMINB12, FOLATE, FERRITIN, TIBC, IRON, RETICCTPCT in the last 72 hours. Sepsis Labs:  Recent Labs Lab 08/31/16 0112 09/01/16 0454  LATICACIDVEN 2.4* 0.9    Recent Results (from the past 240 hour(s))  Surgical pcr screen     Status: Abnormal   Collection Time: 08/30/16  1:03 PM  Result Value Ref Range Status   MRSA, PCR POSITIVE (A) NEGATIVE Corrected    Comment: RESULT CALLED TO, READ BACK BY AND VERIFIED WITH: ORA,I. RN ON 07.30.18 AT 1615 BY INGLEE CORRECTED ON 07/30 AT 1614: PREVIOUSLY REPORTED AS POSITIVE RESULT CALLED TO, READ BACK BY AND VERIFIED WITH: ORA,I RN ON 07.30.18 AT 1415 BY INGLEE    Staphylococcus aureus POSITIVE (A) NEGATIVE Final    Comment:        The Xpert SA Assay (FDA approved for NASAL specimens in patients over 69 years of age), is one component of a comprehensive surveillance program.  Test performance has been validated by [facility] for patients greater than or equal to 62 year old. It is not intended to diagnose infection nor to guide or monitor treatment.   Culture, blood (routine x 2)     Status: Abnormal (Preliminary result)   Collection Time:  08/30/16  7:16 PM  Result Value Ref Range Status   Specimen Description BLOOD BLOOD RIGHT HAND  Final   Special Requests IN PEDIATRIC BOTTLE Blood Culture adequate volume  Final   Culture  Setup Time   Final    GRAM POSITIVE COCCI IN CLUSTERS IN PEDIATRIC BOTTLE CRITICAL RESULT CALLED TO, READ BACK BY AND VERIFIED WITH: A [facility] PHARMD 1928 08/31/16 A BROWNING Performed at [facility], [place] [address], [place], [place] [address]    Culture STAPHYLOCOCCUS HOMINIS (A)  Final   Report Status PENDING  Incomplete  Culture, blood (routine x 2)     Status: Abnormal (Preliminary result)   Collection Time: 08/30/16  7:16 PM  Result Value Ref Range Status   Specimen Description BLOOD  BLOOD RIGHT ARM  Final   Special Requests IN PEDIATRIC BOTTLE Blood Culture adequate volume  Final   Culture  Setup Time   Final    GRAM POSITIVE COCCI IN CLUSTERS IN PEDIATRIC BOTTLE IDENTIFICATION TO FOLLOW CRITICAL RESULT CALLED TO, READ BACK BY AND VERIFIED WITH: A [facility] PHARMD 1928 08/31/16 A BROWNING Performed at [facility], [place] [address], [place], [place] [address]    Culture STAPHYLOCOCCUS HOMINIS (A)  Final   Report Status PENDING  Incomplete  Blood Culture ID Panel (Reflexed)     Status: Abnormal   Collection Time: 08/30/16  7:16 PM  Result Value Ref Range Status   Enterococcus species NOT DETECTED NOT DETECTED Final   Listeria monocytogenes NOT DETECTED NOT DETECTED Final   Staphylococcus species DETECTED (A) NOT DETECTED Final    Comment: Methicillin (oxacillin) resistant coagulase negative staphylococcus. Possible blood culture contaminant (unless isolated from more than one blood culture draw or clinical case suggests pathogenicity). No antibiotic treatment is indicated for blood  culture contaminants. CRITICAL RESULT CALLED TO, READ BACK BY AND VERIFIED WITH: A [facility] PHARMD 1928 08/31/16 A BROWNING    Staphylococcus aureus NOT DETECTED NOT DETECTED Final   Methicillin resistance  DETECTED (A) NOT DETECTED Final    Comment: CRITICAL RESULT CALLED TO, READ BACK BY AND VERIFIED WITH: A [facility] PHARMD 1928 08/31/16 A BROWNING    Streptococcus species NOT DETECTED NOT DETECTED Final   Streptococcus agalactiae NOT DETECTED NOT DETECTED Final   Streptococcus pneumoniae NOT DETECTED NOT DETECTED Final   Streptococcus pyogenes NOT DETECTED NOT DETECTED Final   Acinetobacter baumannii NOT DETECTED NOT DETECTED Final   Enterobacteriaceae species NOT DETECTED NOT DETECTED Final   Enterobacter cloacae complex NOT DETECTED NOT DETECTED Final   Escherichia coli NOT DETECTED NOT DETECTED Final   Klebsiella oxytoca NOT DETECTED NOT DETECTED Final   Klebsiella pneumoniae NOT DETECTED NOT DETECTED Final   Proteus species NOT DETECTED NOT DETECTED Final   Serratia marcescens NOT DETECTED NOT DETECTED Final   Haemophilus influenzae NOT DETECTED NOT DETECTED Final   Neisseria meningitidis NOT DETECTED NOT DETECTED Final   Pseudomonas aeruginosa NOT DETECTED NOT DETECTED Final   Candida albicans NOT DETECTED NOT DETECTED Final   Candida glabrata NOT DETECTED NOT DETECTED Final   Candida krusei NOT DETECTED NOT DETECTED Final   Candida parapsilosis NOT DETECTED NOT DETECTED Final   Candida tropicalis NOT DETECTED NOT DETECTED Final    Comment: Performed at [facility], [place]. [address], [place], [place] [address]  Urine culture     Status: Abnormal   Collection Time: 08/31/16  3:26 AM  Result Value Ref Range Status   Specimen Description URINE, RANDOM  Final   Special Requests NONE  Final   Culture (A)  Final    <10,000 COLONIES/mL INSIGNIFICANT GROWTH Performed at [place] [address] [place], [place] [address]    Report Status 09/01/2016 FINAL  Final         Radiology Studies: Dg Chest 2 View  Result Date: 08/31/2016 CLINICAL DATA:  Fever. Previous episodes of pneumonia. History of multiple scleroses. Thumb are smoker. EXAM: CHEST  2 VIEW COMPARISON:   Chest x-ray of July 29, 2016 FINDINGS: The lungs are well-expanded. There is no focal infiltrate. There is no pleural effusion. The interstitial markings of both lungs are coarse but stable. The heart and pulmonary vascularity are normal. The mediastinum is normal in width. The bony thorax exhibits no acute abnormality.  IMPRESSION: Mild chronic bronchitic changes, stable. No pneumonia, CHF, nor other acute cardiopulmonary abnormality. Electronically Signed   By: David  Martinique M.D.   On: 08/31/2016 08:40   Ct Angio Chest Pe W Or Wo Contrast  Result Date: 08/31/2016 CLINICAL DATA:  Fever with positive D-dimer examination. Multiple sclerosis EXAM: CT ANGIOGRAPHY CHEST WITH CONTRAST TECHNIQUE: Multidetector CT imaging of the chest was performed using the standard protocol during bolus administration of intravenous contrast. Multiplanar CT image reconstructions and MIPs were obtained to evaluate the vascular anatomy. CONTRAST:  100 mL Isovue 370 nonionic COMPARISON:  Chest CT April 07, 2016; chest radiograph August 31, 2016 FINDINGS: Cardiovascular: There is no demonstrable pulmonary embolus. There is no thoracic aortic aneurysm or dissection. The visualized great vessels appear unremarkable. There is no appreciable pericardial thickening. Mediastinum/Nodes: Thyroid appears unremarkable. There is no evident thoracic adenopathy. Lungs/Pleura: There is scarring in the extreme lung apices. There is mild central peribronchial thickening. There is a small pleural effusion on each side. There is no edema or consolidation. There is slight there is atelectatic change in the lateral and posterior left base regions. There is suspected early pneumonia in the lateral left base. Upper Abdomen: Visualized upper abdominal structures appear unremarkable. Musculoskeletal: There are no blastic or lytic bone lesions. Review of the MIP images confirms the above findings. IMPRESSION: 1.  No demonstrable pulmonary embolus. 2.  Atelectasis  with probable early pneumonia lateral left base. 3. Mild central peribronchial thickening. Suspect chronic bronchitis. 4.  Small pleural effusions bilaterally. 5.  No demonstrable adenopathy. Electronically Signed   By: Lowella Grip III M.D.   On: 08/31/2016 09:06   US Abdomen Limited Ruq  Result Date: 08/31/2016 CLINICAL DATA:  Abnormal liver enzymes bowel EXAM: ULTRASOUND ABDOMEN LIMITED RIGHT UPPER QUADRANT COMPARISON:  None FINDINGS: Gallbladder: No gallstones or wall thickening visualized. No sonographic Murphy sign noted by sonographer. Common bile duct: Diameter: Normal at 4 mm Liver: No focal lesion identified. Within normal limits in parenchymal echogenicity. IMPRESSION: Normal RIGHT upper quadrant ultrasound. Electronically Signed   By: Suzy Bouchard M.D.   On: 08/31/2016 08:18        Scheduled Meds: . [START ON 09/02/2016] alvimopan  12 mg Oral On Call to OR  . chlorhexidine  15 mL Mouth Rinse BID  . Chlorhexidine Gluconate Cloth  6 each Topical Q0600  . docusate sodium  100 mg Oral BID  . feeding supplement (ENSURE ENLIVE)  237 mL Oral TID BM  . heparin subcutaneous  5,000 Units Subcutaneous Q8H  . mouth rinse  15 mL Mouth Rinse q12n4p  . mupirocin ointment  1 application Nasal BID  . polyethylene glycol  17 g Oral BID  . senna  1 tablet Oral QHS   Continuous Infusions: . sodium chloride 125 mL/hr at 09/01/16 1313  . piperacillin-tazobactam (ZOSYN)  IV    . vancomycin Stopped (08/31/16 2112)     LOS: 2 days    Time spent: 32 mins     Ankit Arsenio Loader, MD [facility] Pager [phone]   If 7PM-7AM, please contact night-coverage [email or web address] Password [facility] 09/01/2016, 3:42 PM

## 2016-09-01 NOTE — Progress Notes (Signed)
Patient's BP in the 90s, patient denies any distress,  Dr. Reesa Chew notified and order given for IV bolus. Will continue to monitor patient.

## 2016-09-01 NOTE — Progress Notes (Signed)
Patient's BP after bolus still 91/58, Dr. Reesa Chew notified order given for another bolus and also ordered Albumin. Reported off to night shift nurse to F/U with plan of care.

## 2016-09-01 NOTE — Progress Notes (Signed)
RD consulted for nutrition assessment of nutrition requirements/status.   Please refer to RD assessment note written 08/31/2016 at 8:48.   Mariana Single RD, LDN Clinical Nutrition Pager # [phone]

## 2016-09-02 LAB — BASIC METABOLIC PANEL
Anion gap: 7 (ref 5–15)
BUN: 5 mg/dL — AB (ref 6–20)
CALCIUM: 8.9 mg/dL (ref 8.9–10.3)
CHLORIDE: 109 mmol/L (ref 101–111)
CO2: 26 mmol/L (ref 22–32)
Glucose, Bld: 95 mg/dL (ref 65–99)
Potassium: 3.6 mmol/L (ref 3.5–5.1)
SODIUM: 142 mmol/L (ref 135–145)

## 2016-09-02 LAB — CULTURE, BLOOD (ROUTINE X 2)
SPECIAL REQUESTS: ADEQUATE
SPECIAL REQUESTS: ADEQUATE

## 2016-09-02 LAB — VANCOMYCIN, TROUGH

## 2016-09-02 LAB — MAGNESIUM: MAGNESIUM: 1.9 mg/dL (ref 1.7–2.4)

## 2016-09-02 MED ORDER — MINERAL OIL RE ENEM
1.0000 | ENEMA | Freq: Once | RECTAL | Status: AC
  Administered 2016-09-02: 1 via RECTAL
  Filled 2016-09-02: qty 1

## 2016-09-02 MED ORDER — DEXTROSE 5 % IV SOLN
1.0000 g | INTRAVENOUS | Status: AC
  Administered 2016-09-03: 1 g via INTRAVENOUS
  Filled 2016-09-02: qty 10

## 2016-09-02 MED ORDER — ALVIMOPAN 12 MG PO CAPS
12.0000 mg | ORAL_CAPSULE | ORAL | Status: AC
  Administered 2016-09-03: 12 mg via ORAL
  Filled 2016-09-02: qty 1

## 2016-09-02 MED ORDER — FLEET ENEMA 7-19 GM/118ML RE ENEM
1.0000 | ENEMA | Freq: Once | RECTAL | Status: AC
  Administered 2016-09-02: 1 via RECTAL
  Filled 2016-09-02: qty 1

## 2016-09-02 MED ORDER — VANCOMYCIN HCL 500 MG IV SOLR
500.0000 mg | Freq: Three times a day (TID) | INTRAVENOUS | Status: DC
  Administered 2016-09-03 – 2016-09-04 (×3): 500 mg via INTRAVENOUS
  Filled 2016-09-02 (×6): qty 500

## 2016-09-02 MED ORDER — POTASSIUM CHLORIDE IN NACL 20-0.9 MEQ/L-% IV SOLN
INTRAVENOUS | Status: DC
  Administered 2016-09-02 – 2016-09-03 (×4): via INTRAVENOUS
  Administered 2016-09-05: 75 mL/h via INTRAVENOUS
  Administered 2016-09-05: 17:00:00 via INTRAVENOUS
  Filled 2016-09-02 (×10): qty 1000

## 2016-09-02 NOTE — Progress Notes (Signed)
Pharmacy Antibiotic Note  Deanna Baldwin is a 41 y.o. female with hx multiple sclerosis and neurogenic bladder presented to [facility] on 08/30/16 from SNF for workup and management of neurogenic bladder.  Patient was febrile on admission and subsequently started on vancomycin and zosyn.  Blood cultures now positive for staph hominis.  Today, 09/02/2016: - day #4 abx -  Afebrile. WBC wnl - SCr <0.3 stable (MS), CrCl 57 CG - plan for ileal conduit on 8/3  Plan: - continue vancomycin 750 mg IV q24h.  Will check level tonight to assess current regimen - continue zosyn 3.375 gm IV q8h (infuse over 4 hrs)  ____________________________  Height: 5\' 9"  (175.3 cm) Weight: 84 lb 14.4 oz (38.5 kg) IBW/kg (Calculated) : 66.2  Temp (24hrs), Avg:99.1 F (37.3 C), Min:98 F (36.7 C), Max:100 F (37.8 C)   Recent Labs Lab 08/30/16 1430 08/31/16 0112 08/31/16 0259 09/01/16 0454 09/01/16 1025 09/02/16 0434  WBC 12.2*  --  9.7 6.9  --   --   CREATININE 0.40* 0.42*  --  <0.30* <0.30* <0.30*  LATICACIDVEN  --  2.4*  --  0.9  --   --     CrCl cannot be calculated (This lab value cannot be used to calculate CrCl because it is not a number: <0.30).    No Known Allergies  Antimicrobials this admission: 7/30 Vancomycin >>  7/30 Zosyn >>   Dose adjustments this admission: 8/2 VT at 1930 = ___ (750 mg q24h)  Microbiology results: 7/31 UCx: insignif growth FINAL 7/30 BCx x2: 2/2 GPC in clusters (BCID= staph species, Methicillin resistance); staph hominis 7/30 staph PCR: positive 7/30 MRSA PCR: positive  Thank you for allowing pharmacy to be a part of this patient's care.  Lynelle Doctor 09/02/2016 8:43 AM

## 2016-09-02 NOTE — Progress Notes (Signed)
Brief Pharmacy Note Re: Vancomycin  For complete details see Dia Sitter, PharmD's note from earlier today.  She is on Vancomycin 750mg  IV q24h for S. hominus bacteremia.    Vancomycin trough <4 (goal 15-20)  Patient appears to be clearing Vancomycin well.  Appears she has had difficulty getting to therapeutic goal range in the past on q24h & q12h regimens.     Plan: Give Vancomycin 750mg  IV dose as ordered for now.   Increase Vancomycin 500mg  IV q8h Monitor renal function and cx data  Recheck Vancomycin trough at steady state  Netta Cedars, PharmD, BCPS Pager: [phone] 09/02/2016@8 :25 PM

## 2016-09-02 NOTE — Plan of Care (Signed)
Problem: Skin Integrity: Goal: Risk for impaired skin integrity will decrease Outcome: Progressing Pt is in use of air mattress. Pt has also been repositioned q2 hrs. Wound care to be completed

## 2016-09-02 NOTE — Progress Notes (Signed)
PROGRESS NOTE    Deanna Baldwin  WIO:[id number] DOB: December 15, 1975 DOA: 08/30/2016 PCP: [name], DO   Brief Narrative:   41 year old female with past medical history of multiple sclerosis and neurogenic bladder presented to the ER with UTI/urosepsis. Patient was started on vancomycin and Zosyn and admitted to urology service. Medicine team was consulted due to hypotension and fever.  Assessment & Plan:   Principal Problem:   Hypotension Active Problems:   UTI (urinary tract infection)   Neurogenic bladder   Sepsis (HCC)   Fever, unspecified   Abnormal liver function   Pressure injury of skin  Hypotension/fever; improved  Sepsis secondary to likely urinary tract infection -Cultures have been ordered and one of the cultures grew Staphylococcus hominis. Repeat cultures have been ordered yesterday, results pending.  -Continue vancomycin and Zosyn. Start deescalating after the procedure tomorrow.  -CTA of the chest done 7/31- was negative for pulmonary embolism, but showed atelectasis with possible early pneumonia and chronic bronchitis. -A.m. cortisol normal-15 -Echocardiogram done on 08/31/2016 shows normal ejection fraction. Low suspicion for endocarditis.  -Her current blood pressure of around systolic [phone] is at her baseline. Closely monitor urine output. Cont Albumin for one more dose.   History of neurogenic bladder; STABLE  -This is likely secondary to her multiple sclerosis. Management per urology  Multiple Sclerosis- stable   Moderate to severe protein calorie malnutrition -Encourage oral intake.   Bowel regimen prn.   DVT prophylaxis: Heparin Code Status: Full code Family Communication:  None at bedside Disposition Plan: Plans for OR.   Antimicrobials:   Vanc   Zosyn   Subjective: Patient doesn't have any complaints. Still not moving bowel Had episode of low BP later in the evening when she was given another bolus of IVF. Her Albumin was also noted  to be low therefore started on Albumin for 4 doses.   Objective: Vitals:   09/01/16 2353 09/02/16 0203 09/02/16 0324 09/02/16 0625  BP: (!) 97/59 103/61 104/64 100/60  Pulse:  95 97 (!) 101  Resp:    20  Temp:    98.5 F (36.9 C)  TempSrc:    Oral  SpO2:   98% 98%  Weight:      Height:        Intake/Output Summary (Last 24 hours) at 09/02/16 1049 Last data filed at 09/02/16 0700  Gross per 24 hour  Intake          3644.99 ml  Output             2400 ml  Net          1244.99 ml   Filed Weights   08/30/16 1214  Weight: 38.5 kg (84 lb 14.4 oz)    Examination:  General exam: Appears calm and comfortable. Thin cachectic appearing.  Respiratory system: Clear to auscultation. Respiratory effort normal. Cardiovascular system: S1 & S2 heard, RRR. No JVD, murmurs, rubs, gallops or clicks. No pedal edema. Gastrointestinal system: Abdomen is nondistended, soft and nontender. No organomegaly or masses felt. Normal bowel sounds heard. Central nervous system: Alert and oriented. No focal neurological deficits. Extremities: Symmetric 5 x 5 power. Skin: No rashes, lesions or ulcers Psychiatry: Judgement and insight appear normal. Mood & affect appropriate.     Data Reviewed:   CBC:  Recent Labs Lab 08/30/16 1430 08/31/16 0259 09/01/16 0454  WBC 12.2* 9.7 6.9  HGB 12.1 9.0* 8.3*  HCT 39.9 29.4* 26.9*  MCV 85.6 83.8 84.3  PLT 603* 457* 502*  Basic Metabolic Panel:  Recent Labs Lab 08/30/16 1430 08/31/16 0112 09/01/16 0454 09/01/16 1025 09/02/16 0434  NA 143 138  --  141 142  K 4.7 3.8  --  3.6 3.6  CL 100* 103  --  105 109  CO2 29 25  --  26 26  GLUCOSE 106* 147*  --  158* 95  BUN 11 15  --  <5* 5*  CREATININE 0.40* 0.42* <0.30* <0.30* <0.30*  CALCIUM 9.8 8.1*  --  8.7* 8.9  MG  --   --   --   --  1.9   GFR: CrCl cannot be calculated (This lab value cannot be used to calculate CrCl because it is not a number: <0.30). Liver Function Tests:  Recent Labs Lab  08/31/16 0112  AST 60*  ALT 62*  ALKPHOS 134*  BILITOT 0.4  PROT 5.7*  ALBUMIN 2.1*   No results for input(s): LIPASE, AMYLASE in the last 168 hours. No results for input(s): AMMONIA in the last 168 hours. Coagulation Profile: No results for input(s): INR, PROTIME in the last 168 hours. Cardiac Enzymes:  Recent Labs Lab 08/31/16 0259 08/31/16 1155 08/31/16 1449  TROPONINI <0.03 <0.03 <0.03   BNP (last 3 results) No results for input(s): PROBNP in the last 8760 hours. HbA1C: No results for input(s): HGBA1C in the last 72 hours. CBG: No results for input(s): GLUCAP in the last 168 hours. Lipid Profile: No results for input(s): CHOL, HDL, LDLCALC, TRIG, CHOLHDL, LDLDIRECT in the last 72 hours. Thyroid Function Tests: No results for input(s): TSH, T4TOTAL, FREET4, T3FREE, THYROIDAB in the last 72 hours. Anemia Panel: No results for input(s): VITAMINB12, FOLATE, FERRITIN, TIBC, IRON, RETICCTPCT in the last 72 hours. Sepsis Labs:  Recent Labs Lab 08/31/16 0112 09/01/16 0454  LATICACIDVEN 2.4* 0.9    Recent Results (from the past 240 hour(s))  Surgical pcr screen     Status: Abnormal   Collection Time: 08/30/16  1:03 PM  Result Value Ref Range Status   MRSA, PCR POSITIVE (A) NEGATIVE Corrected    Comment: RESULT CALLED TO, READ BACK BY AND VERIFIED WITH: ORA,I. RN ON 07.30.18 AT 1615 BY INGLEE CORRECTED ON 07/30 AT 1614: PREVIOUSLY REPORTED AS POSITIVE RESULT CALLED TO, READ BACK BY AND VERIFIED WITH: ORA,I RN ON 07.30.18 AT 1415 BY INGLEE    Staphylococcus aureus POSITIVE (A) NEGATIVE Final    Comment:        The Xpert SA Assay (FDA approved for NASAL specimens in patients over 81 years of age), is one component of a comprehensive surveillance program.  Test performance has been validated by [facility] for patients greater than or equal to 3 year old. It is not intended to diagnose infection nor to guide or monitor treatment.   Culture, blood (routine x  2)     Status: Abnormal   Collection Time: 08/30/16  7:16 PM  Result Value Ref Range Status   Specimen Description BLOOD BLOOD RIGHT HAND  Final   Special Requests IN PEDIATRIC BOTTLE Blood Culture adequate volume  Final   Culture  Setup Time   Final    GRAM POSITIVE COCCI IN CLUSTERS IN PEDIATRIC BOTTLE CRITICAL RESULT CALLED TO, READ BACK BY AND VERIFIED WITH: A [facility] PHARMD 1928 08/31/16 A BROWNING Performed at [facility], [place] [address] [place], [place] [address]    Culture STAPHYLOCOCCUS HOMINIS (A)  Final   Report Status 09/02/2016 FINAL  Final   Organism ID, Bacteria STAPHYLOCOCCUS HOMINIS  Final      Susceptibility   Staphylococcus hominis - MIC*    CIPROFLOXACIN <=0.5 SENSITIVE Sensitive     ERYTHROMYCIN <=0.25 SENSITIVE Sensitive     GENTAMICIN <=0.5 SENSITIVE Sensitive     OXACILLIN 0.5 RESISTANT Resistant     TETRACYCLINE >=16 RESISTANT Resistant     VANCOMYCIN 1 SENSITIVE Sensitive     TRIMETH/SULFA 80 RESISTANT Resistant     CLINDAMYCIN <=0.25 SENSITIVE Sensitive     RIFAMPIN <=0.5 SENSITIVE Sensitive     Inducible Clindamycin NEGATIVE Sensitive     * STAPHYLOCOCCUS HOMINIS  Culture, blood (routine x 2)     Status: Abnormal   Collection Time: 08/30/16  7:16 PM  Result Value Ref Range Status   Specimen Description BLOOD BLOOD RIGHT ARM  Final   Special Requests IN PEDIATRIC BOTTLE Blood Culture adequate volume  Final   Culture  Setup Time   Final    GRAM POSITIVE COCCI IN CLUSTERS IN PEDIATRIC BOTTLE CRITICAL RESULT CALLED TO, READ BACK BY AND VERIFIED WITH: A [facility] PHARMD 1928 08/31/16 A BROWNING Performed at [facility], [place] [address], [place], [place] [address]    Culture STAPHYLOCOCCUS HOMINIS (A)  Final   Report Status 09/02/2016 FINAL  Final  Blood Culture ID Panel (Reflexed)     Status: Abnormal   Collection Time: 08/30/16  7:16 PM  Result Value Ref Range Status   Enterococcus species NOT DETECTED NOT DETECTED Final   Listeria monocytogenes  NOT DETECTED NOT DETECTED Final   Staphylococcus species DETECTED (A) NOT DETECTED Final    Comment: Methicillin (oxacillin) resistant coagulase negative staphylococcus. Possible blood culture contaminant (unless isolated from more than one blood culture draw or clinical case suggests pathogenicity). No antibiotic treatment is indicated for blood  culture contaminants. CRITICAL RESULT CALLED TO, READ BACK BY AND VERIFIED WITH: A [facility] PHARMD 1928 08/31/16 A BROWNING    Staphylococcus aureus NOT DETECTED NOT DETECTED Final   Methicillin resistance DETECTED (A) NOT DETECTED Final    Comment: CRITICAL RESULT CALLED TO, READ BACK BY AND VERIFIED WITH: A [facility] PHARMD 1928 08/31/16 A BROWNING    Streptococcus species NOT DETECTED NOT DETECTED Final   Streptococcus agalactiae NOT DETECTED NOT DETECTED Final   Streptococcus pneumoniae NOT DETECTED NOT DETECTED Final   Streptococcus pyogenes NOT DETECTED NOT DETECTED Final   Acinetobacter baumannii NOT DETECTED NOT DETECTED Final   Enterobacteriaceae species NOT DETECTED NOT DETECTED Final   Enterobacter cloacae complex NOT DETECTED NOT DETECTED Final   Escherichia coli NOT DETECTED NOT DETECTED Final   Klebsiella oxytoca NOT DETECTED NOT DETECTED Final   Klebsiella pneumoniae NOT DETECTED NOT DETECTED Final   Proteus species NOT DETECTED NOT DETECTED Final   Serratia marcescens NOT DETECTED NOT DETECTED Final   Haemophilus influenzae NOT DETECTED NOT DETECTED Final   Neisseria meningitidis NOT DETECTED NOT DETECTED Final   Pseudomonas aeruginosa NOT DETECTED NOT DETECTED Final   Candida albicans NOT DETECTED NOT DETECTED Final   Candida glabrata NOT DETECTED NOT DETECTED Final   Candida krusei NOT DETECTED NOT DETECTED Final   Candida parapsilosis NOT DETECTED NOT DETECTED Final   Candida tropicalis NOT DETECTED NOT DETECTED Final    Comment: Performed at [facility], [place]. [address] [place], [place] [address]  Urine culture     Status:  Abnormal   Collection Time: 08/31/16  3:26 AM  Result Value Ref Range Status   Specimen Description URINE, RANDOM  Final   Special Requests NONE  Final   Culture (A)  Final    <10,000 COLONIES/mL INSIGNIFICANT GROWTH Performed at [facility], [place] [address] [place], [place] [address]    Report Status 09/01/2016 FINAL  Final         Radiology Studies: No results found.      Scheduled Meds: . [START ON 09/03/2016] alvimopan  12 mg Oral On Call to OR  . chlorhexidine  15 mL Mouth Rinse BID  . Chlorhexidine Gluconate Cloth  6 each Topical Q0600  . docusate sodium  100 mg Oral BID  . feeding supplement (ENSURE ENLIVE)  237 mL Oral TID BM  . heparin subcutaneous  5,000 Units Subcutaneous Q8H  . mouth rinse  15 mL Mouth Rinse q12n4p  . mupirocin ointment  1 application Nasal BID  . polyethylene glycol  17 g Oral BID  . senna  1 tablet Oral QHS   Continuous Infusions: . 0.9 % NaCl with KCl 20 mEq / L    . albumin human    . piperacillin-tazobactam (ZOSYN)  IV Stopped (09/02/16 9485)  . vancomycin Stopped (09/01/16 2357)     LOS: 3 days    Time spent: 33 mins     Ankit Arsenio Loader, MD [facility] Pager [phone]   If 7PM-7AM, please contact night-coverage [email or web address] Password [facility] 09/02/2016, 10:49 AM

## 2016-09-02 NOTE — Progress Notes (Signed)
UROLOGY PROGRESS NOTES  Assessment:  Deanna Baldwin is a 41 y.o. female with a history of progressive MS, neurogenic bladder, decubitus ulcers and eroded bladder neck. She was admitted 7/30, preadmitted prior to planned ileal conduit urinary diversion for bowel prep and antibiotics. On arrival, patient was febrile, tachycardic and hypotensive. Vanc/zosyn initiated, lactate 2.4. Bacteremic from wounds possibly, continues on broad spectrum abx, looks better/feels better.     Interval: Given a bolus overnight for hypotension and started on albumin.  No complaints.  Has not been able to move her bowels yet, despite aggressive regimen.    Plan:  Pre-op: T&S update Aggressive bowel regimen - likely will need enema in addition to miralax Have changed IVF to NS with 34mEq KCL  Neuro: - tylenol PRN for pain   Respiratory: SORA - treatment of pneumonia per medicine, continuing zosyn for now   CV: HDS - telemetry for hypotension    FEN/GI: - mIVF @ 125, regular diet  - bowel regimen  - start bowel clean out with miralax BID - entereg prior to surgery   GU: - monitor UOP - suction external Foley catheter in place - ucx negative     Heme/ID: MRSA bacterermia - continue vanc  - repeat blood cultures today  - appreciate hospitalist assistance with care   PPx:  - clinatron bed  - SQH  - contact precautions  - WOCN for decubitus ulcers and stoma marking   Dispo: if repeat blood cultures negative, will proceed with surgery on Friday   -------   Objective:  Vital signs in last 24 hours: Temp:  [98 F (36.7 C)-100 F (37.8 C)] 98.5 F (36.9 C) (08/02 0625) Pulse Rate:  [95-113] 101 (08/02 0625) Resp:  [20-24] 20 (08/02 0625) BP: (88-104)/(51-64) 100/60 (08/02 0625) SpO2:  [97 %-99 %] 98 % (08/02 0625)  08/01 0701 - 08/02 0700 In: 3765 [P.O.:240; I.V.:3125; IV Piggyback:400] Out: 2400 [Urine:2400]   Physical Exam:  General:  well-developed and well-nourished  in NAD,  lying in bed, alert & oriented, pleasant HEENT: [place]/AT, EOMI, sclera anicteric, hearing grossly intact, no nasal discharge, MMM Respiratory: nonlabored respirations, satting well on RA, symmetrical chest rise Cardiovascular: appropriate perfusion  Abdominal: soft, NTTP, nondistended. Stoma marking in place GU: foley catheter in place to suction   Extremities: warm, well-perfused, no c/c/e Neuro: no focal deficits  Data Review: Results for orders placed or performed during the hospital encounter of 08/30/16 (from the past 24 hour(s))  Basic metabolic panel     Status: Abnormal   Collection Time: 09/01/16 10:25 AM  Result Value Ref Range   Sodium 141 135 - 145 mmol/L   Potassium 3.6 3.5 - 5.1 mmol/L   Chloride 105 101 - 111 mmol/L   CO2 26 22 - 32 mmol/L   Glucose, Bld 158 (H) 65 - 99 mg/dL   BUN <5 (L) 6 - 20 mg/dL   Creatinine, Ser <0.30 (L) 0.44 - 1.00 mg/dL   Calcium 8.7 (L) 8.9 - 10.3 mg/dL   GFR calc non Af Amer NOT CALCULATED >60 mL/min   GFR calc Af Amer NOT CALCULATED >60 mL/min   Anion gap 10 5 - 15  Basic metabolic panel     Status: Abnormal   Collection Time: 09/02/16  4:34 AM  Result Value Ref Range   Sodium 142 135 - 145 mmol/L   Potassium 3.6 3.5 - 5.1 mmol/L   Chloride 109 101 - 111 mmol/L   CO2 26 22 - 32 mmol/L  Glucose, Bld 95 65 - 99 mg/dL   BUN 5 (L) 6 - 20 mg/dL   Creatinine, Ser <0.30 (L) 0.44 - 1.00 mg/dL   Calcium 8.9 8.9 - 10.3 mg/dL   GFR calc non Af Amer NOT CALCULATED >60 mL/min   GFR calc Af Amer NOT CALCULATED >60 mL/min   Anion gap 7 5 - 15  Magnesium     Status: None   Collection Time: 09/02/16  4:34 AM  Result Value Ref Range   Magnesium 1.9 1.7 - 2.4 mg/dL    Imaging:

## 2016-09-03 ENCOUNTER — Inpatient Hospital Stay (HOSPITAL_COMMUNITY): Payer: Medicare Other | Admitting: Anesthesiology

## 2016-09-03 ENCOUNTER — Encounter (HOSPITAL_COMMUNITY)
Admission: RE | Disposition: A | Discharge status: Skilled Nursing Facility | Payer: Self-pay | Source: Ambulatory Visit | Attending: Urology

## 2016-09-03 ENCOUNTER — Encounter (HOSPITAL_COMMUNITY): Payer: Self-pay | Admitting: Urology

## 2016-09-03 HISTORY — PX: DIVERTING ILEOSTOMY: SHX5799

## 2016-09-03 LAB — COMPREHENSIVE METABOLIC PANEL
ALK PHOS: 113 U/L (ref 38–126)
ALT: 32 U/L (ref 14–54)
AST: 20 U/L (ref 15–41)
Albumin: 3 g/dL — ABNORMAL LOW (ref 3.5–5.0)
Anion gap: 9 (ref 5–15)
BILIRUBIN TOTAL: 0.3 mg/dL (ref 0.3–1.2)
BUN: 6 mg/dL (ref 6–20)
CALCIUM: 9.1 mg/dL (ref 8.9–10.3)
CHLORIDE: 106 mmol/L (ref 101–111)
CO2: 26 mmol/L (ref 22–32)
Glucose, Bld: 83 mg/dL (ref 65–99)
Potassium: 4 mmol/L (ref 3.5–5.1)
Sodium: 141 mmol/L (ref 135–145)
Total Protein: 6.3 g/dL — ABNORMAL LOW (ref 6.5–8.1)

## 2016-09-03 LAB — CBC
HEMATOCRIT: 26 % — AB (ref 36.0–46.0)
HEMOGLOBIN: 8 g/dL — AB (ref 12.0–15.0)
MCH: 26.1 pg (ref 26.0–34.0)
MCHC: 30.8 g/dL (ref 30.0–36.0)
MCV: 84.7 fL (ref 78.0–100.0)
PLATELETS: 606 10*3/uL — AB (ref 150–400)
RBC: 3.07 MIL/uL — AB (ref 3.87–5.11)
RDW: 14.8 % (ref 11.5–15.5)
WBC: 4.9 10*3/uL (ref 4.0–10.5)

## 2016-09-03 LAB — BASIC METABOLIC PANEL
Anion gap: 14 (ref 5–15)
CO2: 25 mmol/L (ref 22–32)
CREATININE: 0.45 mg/dL (ref 0.44–1.00)
Calcium: 9.8 mg/dL (ref 8.9–10.3)
Chloride: 106 mmol/L (ref 101–111)
GFR calc Af Amer: >60 mL/min (ref >60)
GLUCOSE: 155 mg/dL — AB (ref 65–99)
POTASSIUM: 4 mmol/L (ref 3.5–5.1)
SODIUM: 145 mmol/L (ref 135–145)

## 2016-09-03 LAB — HEMOGLOBIN AND HEMATOCRIT, BLOOD
HCT: 35.2 % — ABNORMAL LOW (ref 36.0–46.0)
HEMOGLOBIN: 10.6 g/dL — AB (ref 12.0–15.0)

## 2016-09-03 LAB — TYPE AND SCREEN
ABO/RH(D): AB POS
Antibody Screen: NEGATIVE

## 2016-09-03 LAB — MAGNESIUM: Magnesium: 1.9 mg/dL (ref 1.7–2.4)

## 2016-09-03 SURGERY — DIVERTING ILEOSTOMY
Anesthesia: General

## 2016-09-03 MED ORDER — ONDANSETRON HCL 4 MG/2ML IJ SOLN
INTRAMUSCULAR | Status: DC | PRN
  Administered 2016-09-03: 4 mg via INTRAVENOUS

## 2016-09-03 MED ORDER — ONDANSETRON HCL 4 MG/2ML IJ SOLN
INTRAMUSCULAR | Status: AC
  Filled 2016-09-03: qty 2

## 2016-09-03 MED ORDER — ALBUMIN HUMAN 5 % IV SOLN
INTRAVENOUS | Status: AC
  Filled 2016-09-03: qty 250

## 2016-09-03 MED ORDER — FENTANYL CITRATE (PF) 250 MCG/5ML IJ SOLN
INTRAMUSCULAR | Status: AC
  Filled 2016-09-03: qty 5

## 2016-09-03 MED ORDER — ROCURONIUM BROMIDE 50 MG/5ML IV SOSY
PREFILLED_SYRINGE | INTRAVENOUS | Status: AC
  Filled 2016-09-03: qty 10

## 2016-09-03 MED ORDER — ROCURONIUM BROMIDE 50 MG/5ML IV SOSY
PREFILLED_SYRINGE | INTRAVENOUS | Status: AC
  Filled 2016-09-03: qty 5

## 2016-09-03 MED ORDER — ALBUMIN HUMAN 5 % IV SOLN
INTRAVENOUS | Status: DC | PRN
  Administered 2016-09-03: 11:00:00 via INTRAVENOUS

## 2016-09-03 MED ORDER — BUPIVACAINE HCL (PF) 0.5 % IJ SOLN
INTRAMUSCULAR | Status: AC
  Filled 2016-09-03: qty 30

## 2016-09-03 MED ORDER — LACTATED RINGERS IV SOLN: INTRAVENOUS | Status: DC

## 2016-09-03 MED ORDER — HYDROMORPHONE HCL-NACL 0.5-0.9 MG/ML-% IV SOSY
PREFILLED_SYRINGE | INTRAVENOUS | Status: AC
  Filled 2016-09-03: qty 2

## 2016-09-03 MED ORDER — SODIUM CHLORIDE 0.9 % IJ SOLN
INTRAMUSCULAR | Status: AC
  Filled 2016-09-03: qty 50

## 2016-09-03 MED ORDER — HYDROMORPHONE HCL-NACL 0.5-0.9 MG/ML-% IV SOSY
0.2500 mg | PREFILLED_SYRINGE | INTRAVENOUS | Status: DC | PRN
  Administered 2016-09-03 (×2): 0.25 mg via INTRAVENOUS

## 2016-09-03 MED ORDER — MORPHINE SULFATE (PF) 4 MG/ML IV SOLN
1.0000 mg | INTRAVENOUS | Status: DC | PRN
  Administered 2016-09-05: 2 mg via INTRAVENOUS
  Filled 2016-09-03: qty 1

## 2016-09-03 MED ORDER — DEXAMETHASONE SODIUM PHOSPHATE 10 MG/ML IJ SOLN
INTRAMUSCULAR | Status: AC
  Filled 2016-09-03: qty 1

## 2016-09-03 MED ORDER — KETOROLAC TROMETHAMINE 15 MG/ML IJ SOLN
15.0000 mg | Freq: Four times a day (QID) | INTRAMUSCULAR | Status: AC
  Administered 2016-09-03 – 2016-09-04 (×6): 15 mg via INTRAVENOUS
  Filled 2016-09-03 (×6): qty 1

## 2016-09-03 MED ORDER — 0.9 % SODIUM CHLORIDE (POUR BTL) OPTIME
TOPICAL | Status: DC | PRN
  Administered 2016-09-03: 1000 mL

## 2016-09-03 MED ORDER — ALVIMOPAN 12 MG PO CAPS
12.0000 mg | ORAL_CAPSULE | Freq: Two times a day (BID) | ORAL | Status: DC
  Administered 2016-09-04 – 2016-09-08 (×7): 12 mg via ORAL
  Filled 2016-09-03 (×11): qty 1

## 2016-09-03 MED ORDER — ACETAMINOPHEN 10 MG/ML IV SOLN
1000.0000 mg | Freq: Four times a day (QID) | INTRAVENOUS | Status: AC
  Administered 2016-09-03 – 2016-09-04 (×4): 1000 mg via INTRAVENOUS
  Filled 2016-09-03 (×4): qty 100

## 2016-09-03 MED ORDER — BUPIVACAINE LIPOSOME 1.3 % IJ SUSP
20.0000 mL | Freq: Once | INTRAMUSCULAR | Status: AC
  Administered 2016-09-03: 20 mL
  Filled 2016-09-03: qty 20

## 2016-09-03 MED ORDER — PROPOFOL 10 MG/ML IV BOLUS
INTRAVENOUS | Status: DC | PRN
  Administered 2016-09-03: 80 mg via INTRAVENOUS

## 2016-09-03 MED ORDER — LIDOCAINE 2% (20 MG/ML) 5 ML SYRINGE
INTRAMUSCULAR | Status: AC
  Filled 2016-09-03: qty 5

## 2016-09-03 MED ORDER — PROPOFOL 10 MG/ML IV BOLUS
INTRAVENOUS | Status: AC
  Filled 2016-09-03: qty 20

## 2016-09-03 MED ORDER — BUPIVACAINE HCL (PF) 0.5 % IJ SOLN
INTRAMUSCULAR | Status: DC | PRN
  Administered 2016-09-03: 30 mL

## 2016-09-03 MED ORDER — SUGAMMADEX SODIUM 200 MG/2ML IV SOLN
INTRAVENOUS | Status: AC
  Filled 2016-09-03: qty 2

## 2016-09-03 MED ORDER — NICOTINE POLACRILEX 2 MG MT GUM
2.0000 mg | CHEWING_GUM | OROMUCOSAL | Status: DC | PRN
  Filled 2016-09-03: qty 1

## 2016-09-03 MED ORDER — PROMETHAZINE HCL 25 MG/ML IJ SOLN: 6.2500 mg | INTRAMUSCULAR | Status: DC | PRN

## 2016-09-03 MED ORDER — DEXAMETHASONE SODIUM PHOSPHATE 10 MG/ML IJ SOLN
INTRAMUSCULAR | Status: DC | PRN
  Administered 2016-09-03: 10 mg via INTRAVENOUS

## 2016-09-03 MED ORDER — FENTANYL CITRATE (PF) 100 MCG/2ML IJ SOLN
INTRAMUSCULAR | Status: DC | PRN
  Administered 2016-09-03 (×6): 25 ug via INTRAVENOUS
  Administered 2016-09-03: 50 ug via INTRAVENOUS
  Administered 2016-09-03 (×2): 25 ug via INTRAVENOUS

## 2016-09-03 MED ORDER — LACTATED RINGERS IV SOLN
INTRAVENOUS | Status: DC | PRN
  Administered 2016-09-03 (×2): via INTRAVENOUS

## 2016-09-03 MED ORDER — PHENYLEPHRINE 40 MCG/ML (10ML) SYRINGE FOR IV PUSH (FOR BLOOD PRESSURE SUPPORT)
PREFILLED_SYRINGE | INTRAVENOUS | Status: DC | PRN
  Administered 2016-09-03 (×3): 40 ug via INTRAVENOUS

## 2016-09-03 MED ORDER — LIDOCAINE 2% (20 MG/ML) 5 ML SYRINGE
INTRAMUSCULAR | Status: DC | PRN
  Administered 2016-09-03: 100 mg via INTRAVENOUS

## 2016-09-03 MED ORDER — ROCURONIUM BROMIDE 10 MG/ML (PF) SYRINGE
PREFILLED_SYRINGE | INTRAVENOUS | Status: DC | PRN
  Administered 2016-09-03: 30 mg via INTRAVENOUS
  Administered 2016-09-03 (×2): 10 mg via INTRAVENOUS
  Administered 2016-09-03: 20 mg via INTRAVENOUS
  Administered 2016-09-03: 40 mg via INTRAVENOUS
  Administered 2016-09-03 (×2): 10 mg via INTRAVENOUS

## 2016-09-03 MED ORDER — MIDAZOLAM HCL 2 MG/2ML IJ SOLN
INTRAMUSCULAR | Status: AC
  Filled 2016-09-03: qty 2

## 2016-09-03 MED ORDER — LACTATED RINGERS IV SOLN
INTRAVENOUS | Status: DC | PRN
  Administered 2016-09-03: 08:00:00 via INTRAVENOUS

## 2016-09-03 MED ORDER — ONDANSETRON HCL 4 MG/2ML IJ SOLN
4.0000 mg | INTRAMUSCULAR | Status: DC | PRN
  Administered 2016-09-05: 4 mg via INTRAVENOUS
  Filled 2016-09-03: qty 2

## 2016-09-03 MED ORDER — SODIUM CHLORIDE 0.9 % IJ SOLN
INTRAMUSCULAR | Status: DC | PRN
  Administered 2016-09-03: 50 mL

## 2016-09-03 MED ORDER — MIDAZOLAM HCL 5 MG/5ML IJ SOLN
INTRAMUSCULAR | Status: DC | PRN
  Administered 2016-09-03: 1 mg via INTRAVENOUS

## 2016-09-03 MED ORDER — SUGAMMADEX SODIUM 200 MG/2ML IV SOLN
INTRAVENOUS | Status: DC | PRN
  Administered 2016-09-03: 80 mg via INTRAVENOUS

## 2016-09-03 MED ORDER — HYDROMORPHONE HCL 1 MG/ML IJ SOLN
INTRAMUSCULAR | Status: DC | PRN
  Administered 2016-09-03 (×3): .2 mg via INTRAVENOUS

## 2016-09-03 MED ORDER — PHENYLEPHRINE 40 MCG/ML (10ML) SYRINGE FOR IV PUSH (FOR BLOOD PRESSURE SUPPORT)
PREFILLED_SYRINGE | INTRAVENOUS | Status: AC
  Filled 2016-09-03: qty 10

## 2016-09-03 MED ORDER — HYDROMORPHONE HCL 2 MG/ML IJ SOLN
INTRAMUSCULAR | Status: AC
  Filled 2016-09-03: qty 1

## 2016-09-03 MED ORDER — PHENYLEPHRINE HCL 10 MG/ML IJ SOLN
INTRAVENOUS | Status: DC | PRN
  Administered 2016-09-03: 25 ug/min via INTRAVENOUS

## 2016-09-03 SURGICAL SUPPLY — 84 items
ATTRACTOMAT 16X20 MAGNETIC DRP (DRAPES) ×2 IMPLANT
BLADE EXTENDED COATED 6.5IN (ELECTRODE) ×2 IMPLANT
BLADE HEX COATED 2.75 (ELECTRODE) IMPLANT
CATH FOLEY 2WAY SLVR 30CC 24FR (CATHETERS) IMPLANT
CATH SIMPLASTIC 24 30ML (CATHETERS) IMPLANT
CHLORAPREP W/TINT 26ML (MISCELLANEOUS) ×2 IMPLANT
CLIP LIGATING HEM O LOK PURPLE (MISCELLANEOUS) ×2 IMPLANT
CLIP LIGATING HEMO O LOK GREEN (MISCELLANEOUS) ×2 IMPLANT
CLIP TI LARGE 6 (CLIP) ×2 IMPLANT
COVER BACK TABLE 60X90IN (DRAPES) IMPLANT
COVER MAYO STAND STRL (DRAPES) ×2 IMPLANT
COVER SURGICAL LIGHT HANDLE (MISCELLANEOUS) ×2 IMPLANT
DECANTER SPIKE VIAL GLASS SM (MISCELLANEOUS) ×4 IMPLANT
DISSECTOR ROUND CHERRY 3/8 STR (MISCELLANEOUS) IMPLANT
DRAIN CHANNEL RND F F (WOUND CARE) ×2 IMPLANT
DRAPE LAPAROSCOPIC ABDOMINAL (DRAPES) ×2 IMPLANT
DRAPE UTILITY XL STRL (DRAPES) IMPLANT
DRAPE WARM FLUID 44X44 (DRAPE) ×2 IMPLANT
DRSG TEGADERM 4X4.75 (GAUZE/BANDAGES/DRESSINGS) ×2 IMPLANT
ELECT PENCIL ROCKER SW 15FT (MISCELLANEOUS) ×2 IMPLANT
ELECT REM PT RETURN 15FT ADLT (MISCELLANEOUS) ×2 IMPLANT
EVACUATOR SILICONE 100CC (DRAIN) ×2 IMPLANT
GAUZE SPONGE 4X4 12PLY STRL (GAUZE/BANDAGES/DRESSINGS) ×2 IMPLANT
GAUZE SPONGE 4X4 16PLY XRAY LF (GAUZE/BANDAGES/DRESSINGS) IMPLANT
GLOVE BIO SURGEON STRL SZ 6.5 (GLOVE) ×2 IMPLANT
GLOVE BIOGEL M STRL SZ7.5 (GLOVE) ×6 IMPLANT
GLOVE BIOGEL PI IND STRL 8 (GLOVE) IMPLANT
GLOVE BIOGEL PI INDICATOR 8 (GLOVE)
GOWN STRL REUS W/TWL LRG LVL3 (GOWN DISPOSABLE) ×14 IMPLANT
HEMOSTAT SURGICEL 2X14 (HEMOSTASIS) IMPLANT
KIT BASIN OR (CUSTOM PROCEDURE TRAY) ×2 IMPLANT
LIGASURE IMPACT 36 18CM CVD LR (INSTRUMENTS) ×2 IMPLANT
LOOP VESSEL MAXI BLUE (MISCELLANEOUS) ×2 IMPLANT
LUBRICANT JELLY K Y 4OZ (MISCELLANEOUS) ×2 IMPLANT
MARKER SKIN DUAL TIP RULER LAB (MISCELLANEOUS) ×2 IMPLANT
NEEDLE HYPO 22GX1.5 SAFETY (NEEDLE) ×4 IMPLANT
NS IRRIG 1000ML POUR BTL (IV SOLUTION) ×4 IMPLANT
PACK GENERAL/GYN (CUSTOM PROCEDURE TRAY) ×2 IMPLANT
PLUG CATH AND CAP STER (CATHETERS) IMPLANT
RELOAD LINEAR CUT PROX 55 BLUE (ENDOMECHANICALS) ×8 IMPLANT
RELOAD PROXIMATE 75MM BLUE (ENDOMECHANICALS) ×2 IMPLANT
RELOAD PROXIMATE TA60MM BLUE (ENDOMECHANICALS) ×2 IMPLANT
SEALER TISSUE X1 CVD JAW (INSTRUMENTS) IMPLANT
SHEET LAVH (DRAPES) IMPLANT
SPONGE LAP 18X18 X RAY DECT (DISPOSABLE) ×2 IMPLANT
STAPLER GUN LINEAR PROX 60 (STAPLE) ×2 IMPLANT
STAPLER PROXIMATE 55 BLUE (STAPLE) ×2 IMPLANT
STAPLER PROXIMATE 75MM BLUE (STAPLE) IMPLANT
STAPLER VISISTAT 35W (STAPLE) ×2 IMPLANT
STENT SET URETHERAL LEFT 7FR (STENTS) ×2 IMPLANT
STENT SET URETHERAL RIGHT 7FR (STENTS) ×2 IMPLANT
SUCTION FRAZIER HANDLE 10FR (MISCELLANEOUS)
SUCTION TUBE FRAZIER 10FR DISP (MISCELLANEOUS) IMPLANT
SUT CHROMIC 3 0 SH 27 (SUTURE) ×4 IMPLANT
SUT ETHILON 3 0 PS 1 (SUTURE) ×2 IMPLANT
SUT MNCRL AB 4-0 PS2 18 (SUTURE) IMPLANT
SUT MON AB 3-0 SH 27 (SUTURE)
SUT MON AB 3-0 SH27 (SUTURE) IMPLANT
SUT PDS AB 1 TP1 96 (SUTURE) ×4 IMPLANT
SUT SILK 0 (SUTURE)
SUT SILK 0 30XBRD TIE 6 (SUTURE) IMPLANT
SUT SILK 0 CT 1 30 (SUTURE) IMPLANT
SUT SILK 3 0 SH CR/8 (SUTURE) ×6 IMPLANT
SUT VIC AB 0 CT2 27 (SUTURE) IMPLANT
SUT VIC AB 2-0 SH 27 (SUTURE)
SUT VIC AB 2-0 SH 27X BRD (SUTURE) IMPLANT
SUT VIC AB 2-0 UR6 27 (SUTURE) IMPLANT
SUT VIC AB 3-0 SH 18 (SUTURE) ×2 IMPLANT
SUT VIC AB 3-0 SH 27 (SUTURE) ×2
SUT VIC AB 3-0 SH 27X BRD (SUTURE) ×1 IMPLANT
SUT VIC AB 4-0 RB1 27 (SUTURE) ×8
SUT VIC AB 4-0 RB1 27XBRD (SUTURE) ×4 IMPLANT
SUT VIC AB 4-0 SH 27 (SUTURE) ×4
SUT VIC AB 4-0 SH 27XBRD (SUTURE) ×2 IMPLANT
SUT VICRYL 3-0 RB1 18 ABS (SUTURE) IMPLANT
SYR 10ML LL (SYRINGE) IMPLANT
SYR 30ML LL (SYRINGE) IMPLANT
SYR CONTROL 10ML LL (SYRINGE) ×2 IMPLANT
SYRINGE 20CC LL (MISCELLANEOUS) ×4 IMPLANT
SYRINGE 60CC LL (MISCELLANEOUS) ×2 IMPLANT
SYSTEM UROSTOMY GENTLE TOUCH (WOUND CARE) ×2 IMPLANT
TAPE CLOTH SURG 4X10 WHT LF (GAUZE/BANDAGES/DRESSINGS) ×2 IMPLANT
TOWEL OR 17X26 10 PK STRL BLUE (TOWEL DISPOSABLE) ×2 IMPLANT
YANKAUER SUCT BULB TIP NO VENT (SUCTIONS) ×2 IMPLANT

## 2016-09-03 NOTE — Progress Notes (Signed)
PROGRESS NOTE    Deanna Baldwin  RDE:[id number] DOB: 07-05-1975 DOA: 08/30/2016 PCP: [name], DO   Brief Narrative:   41 year old female with past medical history of multiple sclerosis and neurogenic bladder presented to the ER with UTI/urosepsis. Patient was started on vancomycin and Zosyn and admitted to urology service. Medicine team was consulted due to hypotension and fever.  Assessment & Plan:   Principal Problem:   Hypotension Active Problems:   UTI (urinary tract infection)   Neurogenic bladder   Sepsis (HCC)   Fever, unspecified   Abnormal liver function   Pressure injury of skin  Hypotension/fever; improved  Sepsis secondary to likely urinary tract infection -Cultures have been ordered and one of the cultures grew Staphylococcus hominis. Repeat cultures have been ordered -NGTD.  -Continue vancomycin and Zosyn. Start deescalating to Cipro if ok with Urology.  -CTA of the chest done 7/31- was negative for pulmonary embolism, but showed atelectasis with possible early pneumonia and chronic bronchitis. -A.m. cortisol normal-15 -Echocardiogram done on 08/31/2016 shows normal ejection fraction. Low suspicion for endocarditis.  -Her current blood pressure of around systolic [phone] is at her baseline. Closely monitor urine output.  -POD #0  History of neurogenic bladder; STABLE  -This is likely secondary to her multiple sclerosis. Management per urology  Multiple Sclerosis- stable   Moderate to severe protein calorie malnutrition -Encourage oral intake.   Bowel regimen prn.   DVT prophylaxis: Heparin Code Status: Full code Family Communication:  None at bedside Disposition Plan: s/p OR today    Antimicrobials:   Vanc   Zosyn   Subjective: Patient doesn't have any complaints today. She is little drowsy post op.   Objective: Vitals:   09/03/16 1345 09/03/16 1400 09/03/16 1415 09/03/16 1432  BP: 100/72 99/69 113/66 101/75  Pulse: (!) 109 (!) 116 (!)  120 (!) 118  Resp: 20 12 15 16   Temp: 97.8 F (36.6 C) 98 F (36.7 C) 98 F (36.7 C) 97.8 F (36.6 C)  TempSrc:      SpO2: 100% 100% 100% 100%  Weight:      Height:        Intake/Output Summary (Last 24 hours) at 09/03/16 1547 Last data filed at 09/03/16 1500  Gross per 24 hour  Intake             5580 ml  Output             1090 ml  Net             4490 ml   Filed Weights   08/30/16 1214  Weight: 38.5 kg (84 lb 14.4 oz)    Examination:  General exam: Appears calm- little drowsy post anesthesia. Thin cachectic appearing.  Respiratory system: Clear to auscultation. Respiratory effort normal. Cardiovascular system: tachycardiac S1 & S2 heard, RRR. No JVD, murmurs, rubs, gallops or clicks. No pedal edema. Gastrointestinal system: Abdomen is nondistended, soft and nontender. No organomegaly or masses felt. Normal bowel sounds heard. Ostomy bag in place Central nervous system: Alert and oriented. No focal neurological deficits. Extremities: Symmetric 5 x 5 power. Skin: No rashes, lesions or ulcers Psychiatry: Judgement and insight appear normal. Mood & affect appropriate.     Data Reviewed:   CBC:  Recent Labs Lab 08/30/16 1430 08/31/16 0259 09/01/16 0454 09/03/16 0508 09/03/16 1335  WBC 12.2* 9.7 6.9 4.9  --   HGB 12.1 9.0* 8.3* 8.0* 10.6*  HCT 39.9 29.4* 26.9* 26.0* 35.2*  MCV 85.6 83.8 84.3 84.7  --  PLT 603* 457* 502* 606*  --    Basic Metabolic Panel:  Recent Labs Lab 08/31/16 0112 09/01/16 0454 09/01/16 1025 09/02/16 0434 09/03/16 0508 09/03/16 1335  NA 138  --  141 142 141 145  K 3.8  --  3.6 3.6 4.0 4.0  CL 103  --  105 109 106 106  CO2 25  --  26 26 26 25   GLUCOSE 147*  --  158* 95 83 155*  BUN 15  --  <5* 5* 6 <5*  CREATININE 0.42* <0.30* <0.30* <0.30* <0.30* 0.45  CALCIUM 8.1*  --  8.7* 8.9 9.1 9.8  MG  --   --   --  1.9 1.9  --    GFR: Estimated Creatinine Clearance: 56.8 mL/min (by C-G formula based on SCr of 0.45 mg/dL). Liver  Function Tests:  Recent Labs Lab 08/31/16 0112 09/03/16 0508  AST 60* 20  ALT 62* 32  ALKPHOS 134* 113  BILITOT 0.4 0.3  PROT 5.7* 6.3*  ALBUMIN 2.1* 3.0*   No results for input(s): LIPASE, AMYLASE in the last 168 hours. No results for input(s): AMMONIA in the last 168 hours. Coagulation Profile: No results for input(s): INR, PROTIME in the last 168 hours. Cardiac Enzymes:  Recent Labs Lab 08/31/16 0259 08/31/16 1155 08/31/16 1449  TROPONINI <0.03 <0.03 <0.03   BNP (last 3 results) No results for input(s): PROBNP in the last 8760 hours. HbA1C: No results for input(s): HGBA1C in the last 72 hours. CBG: No results for input(s): GLUCAP in the last 168 hours. Lipid Profile: No results for input(s): CHOL, HDL, LDLCALC, TRIG, CHOLHDL, LDLDIRECT in the last 72 hours. Thyroid Function Tests: No results for input(s): TSH, T4TOTAL, FREET4, T3FREE, THYROIDAB in the last 72 hours. Anemia Panel: No results for input(s): VITAMINB12, FOLATE, FERRITIN, TIBC, IRON, RETICCTPCT in the last 72 hours. Sepsis Labs:  Recent Labs Lab 08/31/16 0112 09/01/16 0454  LATICACIDVEN 2.4* 0.9    Recent Results (from the past 240 hour(s))  Surgical pcr screen     Status: Abnormal   Collection Time: 08/30/16  1:03 PM  Result Value Ref Range Status   MRSA, PCR POSITIVE (A) NEGATIVE Corrected    Comment: RESULT CALLED TO, READ BACK BY AND VERIFIED WITH: ORA,I. RN ON 07.30.18 AT 1615 BY INGLEE CORRECTED ON 07/30 AT 1614: PREVIOUSLY REPORTED AS POSITIVE RESULT CALLED TO, READ BACK BY AND VERIFIED WITH: ORA,I RN ON 07.30.18 AT 1415 BY INGLEE    Staphylococcus aureus POSITIVE (A) NEGATIVE Final    Comment:        The Xpert SA Assay (FDA approved for NASAL specimens in patients over 19 years of age), is one component of a comprehensive surveillance program.  Test performance has been validated by [facility] for patients greater than or equal to 47 year old. It is not intended to diagnose  infection nor to guide or monitor treatment.   Culture, blood (routine x 2)     Status: Abnormal   Collection Time: 08/30/16  7:16 PM  Result Value Ref Range Status   Specimen Description BLOOD BLOOD RIGHT HAND  Final   Special Requests IN PEDIATRIC BOTTLE Blood Culture adequate volume  Final   Culture  Setup Time   Final    GRAM POSITIVE COCCI IN CLUSTERS IN PEDIATRIC BOTTLE CRITICAL RESULT CALLED TO, READ BACK BY AND VERIFIED WITH: A [facility] PHARMD 1928 08/31/16 A BROWNING Performed at [facility], [place] [address], [place], [place] [address]  Culture STAPHYLOCOCCUS HOMINIS (A)  Final   Report Status 09/02/2016 FINAL  Final   Organism ID, Bacteria STAPHYLOCOCCUS HOMINIS  Final      Susceptibility   Staphylococcus hominis - MIC*    CIPROFLOXACIN <=0.5 SENSITIVE Sensitive     ERYTHROMYCIN <=0.25 SENSITIVE Sensitive     GENTAMICIN <=0.5 SENSITIVE Sensitive     OXACILLIN 0.5 RESISTANT Resistant     TETRACYCLINE >=16 RESISTANT Resistant     VANCOMYCIN 1 SENSITIVE Sensitive     TRIMETH/SULFA 80 RESISTANT Resistant     CLINDAMYCIN <=0.25 SENSITIVE Sensitive     RIFAMPIN <=0.5 SENSITIVE Sensitive     Inducible Clindamycin NEGATIVE Sensitive     * STAPHYLOCOCCUS HOMINIS  Culture, blood (routine x 2)     Status: Abnormal   Collection Time: 08/30/16  7:16 PM  Result Value Ref Range Status   Specimen Description BLOOD BLOOD RIGHT ARM  Final   Special Requests IN PEDIATRIC BOTTLE Blood Culture adequate volume  Final   Culture  Setup Time   Final    GRAM POSITIVE COCCI IN CLUSTERS IN PEDIATRIC BOTTLE CRITICAL RESULT CALLED TO, READ BACK BY AND VERIFIED WITH: A [facility] PHARMD 1928 08/31/16 A BROWNING Performed at [facility], [place] [address] [place], [place] [address]    Culture STAPHYLOCOCCUS HOMINIS (A)  Final   Report Status 09/02/2016 FINAL  Final  Blood Culture ID Panel (Reflexed)     Status: Abnormal   Collection Time: 08/30/16  7:16 PM  Result Value Ref Range Status    Enterococcus species NOT DETECTED NOT DETECTED Final   Listeria monocytogenes NOT DETECTED NOT DETECTED Final   Staphylococcus species DETECTED (A) NOT DETECTED Final    Comment: Methicillin (oxacillin) resistant coagulase negative staphylococcus. Possible blood culture contaminant (unless isolated from more than one blood culture draw or clinical case suggests pathogenicity). No antibiotic treatment is indicated for blood  culture contaminants. CRITICAL RESULT CALLED TO, READ BACK BY AND VERIFIED WITH: A [facility] PHARMD 1928 08/31/16 A BROWNING    Staphylococcus aureus NOT DETECTED NOT DETECTED Final   Methicillin resistance DETECTED (A) NOT DETECTED Final    Comment: CRITICAL RESULT CALLED TO, READ BACK BY AND VERIFIED WITH: A [facility] PHARMD 1928 08/31/16 A BROWNING    Streptococcus species NOT DETECTED NOT DETECTED Final   Streptococcus agalactiae NOT DETECTED NOT DETECTED Final   Streptococcus pneumoniae NOT DETECTED NOT DETECTED Final   Streptococcus pyogenes NOT DETECTED NOT DETECTED Final   Acinetobacter baumannii NOT DETECTED NOT DETECTED Final   Enterobacteriaceae species NOT DETECTED NOT DETECTED Final   Enterobacter cloacae complex NOT DETECTED NOT DETECTED Final   Escherichia coli NOT DETECTED NOT DETECTED Final   Klebsiella oxytoca NOT DETECTED NOT DETECTED Final   Klebsiella pneumoniae NOT DETECTED NOT DETECTED Final   Proteus species NOT DETECTED NOT DETECTED Final   Serratia marcescens NOT DETECTED NOT DETECTED Final   Haemophilus influenzae NOT DETECTED NOT DETECTED Final   Neisseria meningitidis NOT DETECTED NOT DETECTED Final   Pseudomonas aeruginosa NOT DETECTED NOT DETECTED Final   Candida albicans NOT DETECTED NOT DETECTED Final   Candida glabrata NOT DETECTED NOT DETECTED Final   Candida krusei NOT DETECTED NOT DETECTED Final   Candida parapsilosis NOT DETECTED NOT DETECTED Final   Candida tropicalis NOT DETECTED NOT DETECTED Final    Comment: Performed at [facility], [place]. [address] [place], [place] [address]  Urine culture     Status: Abnormal   Collection Time: 08/31/16  3:26 AM  Result Value Ref Range Status   Specimen Description URINE, RANDOM  Final   Special Requests NONE  Final   Culture (A)  Final    <10,000 COLONIES/mL INSIGNIFICANT GROWTH Performed at [facility], [place] [address] [place], [place] [address]    Report Status 09/01/2016 FINAL  Final  Culture, blood (routine x 2)     Status: None (Preliminary result)   Collection Time: 09/01/16 10:25 AM  Result Value Ref Range Status   Specimen Description BLOOD LEFT ARM  Final   Special Requests IN PEDIATRIC BOTTLE Blood Culture adequate volume  Final   Culture   Final    NO GROWTH 2 DAYS Performed at [facility], [place] [address], [place], [place] [address]    Report Status PENDING  Incomplete  Culture, blood (routine x 2)     Status: None (Preliminary result)   Collection Time: 09/01/16 10:25 AM  Result Value Ref Range Status   Specimen Description BLOOD LEFT ARM  Final   Special Requests IN PEDIATRIC BOTTLE Blood Culture adequate volume  Final   Culture   Final    NO GROWTH 2 DAYS Performed at [facility], [place] [address] [place],  [address]    Report Status PENDING  Incomplete         Radiology Studies: No results found.      Scheduled Meds: . [START ON 09/04/2016] alvimopan  12 mg Oral BID  . chlorhexidine  15 mL Mouth Rinse BID  . Chlorhexidine Gluconate Cloth  6 each Topical Q0600  . heparin subcutaneous  5,000 Units Subcutaneous Q8H  . HYDROmorphone      . ketorolac  15 mg Intravenous Q6H  . mouth rinse  15 mL Mouth Rinse q12n4p  . mupirocin ointment  1 application Nasal BID   Continuous Infusions: . 0.9 % NaCl with KCl 20 mEq / L 100 mL/hr at 09/03/16 0523  . acetaminophen    . piperacillin-tazobactam (ZOSYN)  IV Stopped (09/03/16 0537)  . vancomycin Stopped (09/03/16 0649)     LOS: 4 days    Time spent: 33 mins      [place] Arsenio Loader, MD [facility] Pager [phone]   If 7PM-7AM, please contact night-coverage [email or web address] Password [email or web address] 09/03/2016, 3:47 PM

## 2016-09-03 NOTE — Anesthesia Postprocedure Evaluation (Signed)
Anesthesia Post Note  Patient: Deanna Baldwin  Procedure(s) Performed: Procedure(s) (LRB): ILEAL CONDUIT  URINARY DIVERSION OPEN (N/A)     Patient location during evaluation: PACU Anesthesia Type: General Level of consciousness: awake and alert Pain management: pain level controlled Vital Signs Assessment: post-procedure vital signs reviewed and stable Respiratory status: spontaneous breathing, nonlabored ventilation, respiratory function stable and patient connected to nasal cannula oxygen Cardiovascular status: blood pressure returned to baseline and stable Postop Assessment: no signs of nausea or vomiting Anesthetic complications: no    Last Vitals:  Vitals:   09/03/16 1630 09/03/16 1632  BP: (!) 99/56 (!) 101/40  Pulse: (!) 125   Resp: 18   Temp:      Last Pain:  Vitals:   09/03/16 1415  TempSrc:   PainSc: 0-No pain                 Jeraldin Fesler S

## 2016-09-03 NOTE — Progress Notes (Signed)
S/p Supravesical diversion with ileal conduit.  Doing well, with minimal pain currently on both IV tylenol and Toradol.  She is hemodynamically stable with mild tachycardia.    NAD AF VSS (~HR 100s) Stoma is pink, with stents in place and blood tinged urine Incision is c/d/i JP site has been reinforced, draining serosanginous fluid.   Recent Labs  09/01/16 0454 09/03/16 0508 09/03/16 1335  WBC 6.9 4.9  --   HGB 8.3* 8.0* 10.6*  HCT 26.9* 26.0* 35.2*    Recent Labs  09/02/16 0434 09/03/16 0508 09/03/16 1335  NA 142 141 145  K 3.6 4.0 4.0  CL 109 106 106  CO2 26 26 25   GLUCOSE 95 83 155*  BUN 5* 6 <5*  CREATININE <0.30* <0.30* 0.45  CALCIUM 8.9 9.1 9.8   No results for input(s): LABPT, INR in the last 72 hours. No results for input(s): PSA in the last 72 hours. No results for input(s): LABURIN in the last 72 hours. Results for orders placed or performed during the hospital encounter of 08/30/16  Surgical pcr screen     Status: Abnormal   Collection Time: 08/30/16  1:03 PM  Result Value Ref Range Status   MRSA, PCR POSITIVE (A) NEGATIVE Corrected    Comment: RESULT CALLED TO, READ BACK BY AND VERIFIED WITH: ORA,I. RN ON 07.30.18 AT 1615 BY INGLEE CORRECTED ON 07/30 AT 1614: PREVIOUSLY REPORTED AS POSITIVE RESULT CALLED TO, READ BACK BY AND VERIFIED WITH: ORA,I RN ON 07.30.18 AT 1415 BY INGLEE    Staphylococcus aureus POSITIVE (A) NEGATIVE Final    Comment:        The Xpert SA Assay (FDA approved for NASAL specimens in patients over 51 years of age), is one component of a comprehensive surveillance program.  Test performance has been validated by [facility] for patients greater than or equal to 45 year old. It is not intended to diagnose infection nor to guide or monitor treatment.   Culture, blood (routine x 2)     Status: Abnormal   Collection Time: 08/30/16  7:16 PM  Result Value Ref Range Status   Specimen Description BLOOD BLOOD RIGHT HAND  Final   Special Requests IN PEDIATRIC BOTTLE Blood Culture adequate volume  Final   Culture  Setup Time   Final    GRAM POSITIVE COCCI IN CLUSTERS IN PEDIATRIC BOTTLE CRITICAL RESULT CALLED TO, READ BACK BY AND VERIFIED WITH: A [facility] PHARMD 1928 08/31/16 A BROWNING Performed at [facility], [place] [address], [place], [place] [address]    Culture STAPHYLOCOCCUS HOMINIS (A)  Final   Report Status 09/02/2016 FINAL  Final   Organism ID, Bacteria STAPHYLOCOCCUS HOMINIS  Final      Susceptibility   Staphylococcus hominis - MIC*    CIPROFLOXACIN <=0.5 SENSITIVE Sensitive     ERYTHROMYCIN <=0.25 SENSITIVE Sensitive     GENTAMICIN <=0.5 SENSITIVE Sensitive     OXACILLIN 0.5 RESISTANT Resistant     TETRACYCLINE >=16 RESISTANT Resistant     VANCOMYCIN 1 SENSITIVE Sensitive     TRIMETH/SULFA 80 RESISTANT Resistant     CLINDAMYCIN <=0.25 SENSITIVE Sensitive     RIFAMPIN <=0.5 SENSITIVE Sensitive     Inducible Clindamycin NEGATIVE Sensitive     * STAPHYLOCOCCUS HOMINIS  Culture, blood (routine x 2)     Status: Abnormal   Collection Time: 08/30/16  7:16 PM  Result Value Ref Range Status   Specimen Description BLOOD BLOOD RIGHT ARM  Final   Special  Requests IN PEDIATRIC BOTTLE Blood Culture adequate volume  Final   Culture  Setup Time   Final    GRAM POSITIVE COCCI IN CLUSTERS IN PEDIATRIC BOTTLE CRITICAL RESULT CALLED TO, READ BACK BY AND VERIFIED WITH: A [facility] PHARMD 1928 08/31/16 A BROWNING Performed at [facility], [place] [address], [place], [place] [address]    Culture STAPHYLOCOCCUS HOMINIS (A)  Final   Report Status 09/02/2016 FINAL  Final  Blood Culture ID Panel (Reflexed)     Status: Abnormal   Collection Time: 08/30/16  7:16 PM  Result Value Ref Range Status   Enterococcus species NOT DETECTED NOT DETECTED Final   Listeria monocytogenes NOT DETECTED NOT DETECTED Final   Staphylococcus species DETECTED (A) NOT DETECTED Final    Comment: Methicillin (oxacillin) resistant coagulase  negative staphylococcus. Possible blood culture contaminant (unless isolated from more than one blood culture draw or clinical case suggests pathogenicity). No antibiotic treatment is indicated for blood  culture contaminants. CRITICAL RESULT CALLED TO, READ BACK BY AND VERIFIED WITH: A [facility] PHARMD 1928 08/31/16 A BROWNING    Staphylococcus aureus NOT DETECTED NOT DETECTED Final   Methicillin resistance DETECTED (A) NOT DETECTED Final    Comment: CRITICAL RESULT CALLED TO, READ BACK BY AND VERIFIED WITH: A [facility] PHARMD 1928 08/31/16 A BROWNING    Streptococcus species NOT DETECTED NOT DETECTED Final   Streptococcus agalactiae NOT DETECTED NOT DETECTED Final   Streptococcus pneumoniae NOT DETECTED NOT DETECTED Final   Streptococcus pyogenes NOT DETECTED NOT DETECTED Final   Acinetobacter baumannii NOT DETECTED NOT DETECTED Final   Enterobacteriaceae species NOT DETECTED NOT DETECTED Final   Enterobacter cloacae complex NOT DETECTED NOT DETECTED Final   Escherichia coli NOT DETECTED NOT DETECTED Final   Klebsiella oxytoca NOT DETECTED NOT DETECTED Final   Klebsiella pneumoniae NOT DETECTED NOT DETECTED Final   Proteus species NOT DETECTED NOT DETECTED Final   Serratia marcescens NOT DETECTED NOT DETECTED Final   Haemophilus influenzae NOT DETECTED NOT DETECTED Final   Neisseria meningitidis NOT DETECTED NOT DETECTED Final   Pseudomonas aeruginosa NOT DETECTED NOT DETECTED Final   Candida albicans NOT DETECTED NOT DETECTED Final   Candida glabrata NOT DETECTED NOT DETECTED Final   Candida krusei NOT DETECTED NOT DETECTED Final   Candida parapsilosis NOT DETECTED NOT DETECTED Final   Candida tropicalis NOT DETECTED NOT DETECTED Final    Comment: Performed at [facility], [place]. [address] [place], [place] [address]  Urine culture     Status: Abnormal   Collection Time: 08/31/16  3:26 AM  Result Value Ref Range Status   Specimen Description URINE, RANDOM  Final   Special Requests NONE   Final   Culture (A)  Final    <10,000 COLONIES/mL INSIGNIFICANT GROWTH Performed at [place] [address] [place], [place] [address]    Report Status 09/01/2016 FINAL  Final  Culture, blood (routine x 2)     Status: None (Preliminary result)   Collection Time: 09/01/16 10:25 AM  Result Value Ref Range Status   Specimen Description BLOOD LEFT ARM  Final   Special Requests IN PEDIATRIC BOTTLE Blood Culture adequate volume  Final   Culture   Final    NO GROWTH 2 DAYS Performed at [facility], [place] [address], [place], [place] [address]    Report Status PENDING  Incomplete  Culture, blood (routine x 2)     Status: None (Preliminary result)   Collection Time:  09/01/16 10:25 AM  Result Value Ref Range Status   Specimen Description BLOOD LEFT ARM  Final   Special Requests IN PEDIATRIC BOTTLE Blood Culture adequate volume  Final   Culture   Final    NO GROWTH 2 DAYS Performed at [facility], [place] [address], [place], [place] [address]    Report Status PENDING  Incomplete    A/P:  She looks quite good post-operative.  She is on entereg for early return of bowel function, will also try and reduce her fluids to minimize bowel wall edema.  She is on sips of clears currently, advance to clears tomorrow and await bowel function return.  Hgb post-op suggest hemoconcentration, would expect this to fall back to her current baseline (8) or lower.  I would have a low threshold for transfusion.  Ostomy nurse aware of patient's new conduit, they have been seeing her for her bed sores as well.    On Vanc/Zosyn for PNA and wounds - per medicine.

## 2016-09-03 NOTE — Progress Notes (Signed)
Lab results called to Dr. Herrick.   

## 2016-09-03 NOTE — Progress Notes (Signed)
Back From OR.In good spirits.shivering See VS IV infusing

## 2016-09-03 NOTE — Op Note (Signed)
Preoperative diagnosis:  1. Neurogenic bladder 2. Urethral erosion 3. Urinary incontinence   Postoperative diagnosis:  1. same   Procedure: 1. Supravesical urinary diversion with ileal conduit  Surgeon: Ardis Hughs, MD Resident Asst.: Dr. Dorise Bullion, MD  Anesthesia: General  Complications: None  Intraoperative findings: A 10 cm ileal conduit was created with the ureters inserted into the base on either side. Band or stents were placed, blue stent on the left and right stent on the right.  EBL: 200 mL's  Drains: 40 [place] Blake drain was placed in the patient's left lower quadrant and is draining the patient's conduit. 7 [place] Bander stents are placed in either ureter and pulled out through the ostomy.  Specimens: None  Indication: Deanna Baldwin is a 41 y.o. patient with multiple sclerosis, progressive type. Don't manage her neurogenic bladder Foley catheterization placed and unfortunately she developed erosion of her urethra to the bladder neck. This created significant incontinence with associated sacral decubitus ulcers. After several different failed attempts of managing this, we opted to proceed with an ilial conduit, supravesical diversion.  After reviewing the management options for treatment, he elected to proceed with the above surgical procedure(s). We have discussed the potential benefits and risks of the procedure, side effects of the proposed treatment, the likelihood of the patient achieving the goals of the procedure, and any potential problems that might occur during the procedure or recuperation. Informed consent has been obtained.  Description of procedure:  The patient was taken to the operating room and general anesthesia was induced.  We placed the patient in the supine position and secured her legs to table as best as we could in the contracted position that they remained. She was secured to the table and all her bony prominences were padded. A timeout  was then held and the patient was prepped and draped in the routine sterile fashion.  A midline incision was made from the pubic bone around the umbilicus to 2 cm above the umbilicus. This was carried down to the rectus fascia which was then opened and the rectus muscle split. We then entered the abdomen. We then inserted a Balfour retractor and dissected the patient's ureters out bilaterally starting on the patient's left side. The bowel was tucked away with a lap pad and a malleable retractor to provide exposure on each side. The ureters were taken down to the ureterovesical junction bilaterally and 2 clips placed distally. The left ureter was tunneled under the sigmoid mesentery to the patient's right-hand side.  We then went approximately 15 cm proximal to the TI and took a 10 cm piece of ileum. Using the GIA stapler we resected the bowel.The 10 cm ileal loop was taken out of continuity. We then performed a side-to-side into and standard anastomosis using a TA stapler. The staple line was imbricated with Lembert sutures. The mesenteric window was closed with figure-of-eight sutures.  The distal part of the conduit was opened and irrigated to clean out the intestinal contents. A Yankauer was used and passed down through the loop to the base and opened up on the left side where the blue stent was passed through the anchor our and out the end of the conduit. This was performed on the right side in the red stent was passed through. These were snapped. The left ureter was spatulated and anastomosed to the left base of the conduit with 4-0 Vicryl sutures. The base was anchored with 2 separate sutures and then run up over the  edges and tied off in the middle. Prior to closing the anastomosis the stent was passed through the ureter until it would no longer go and the wire was removed. The same process was performed on the patient's right side.  We then created a site for the ostomy on the right side of her  abdomen around her umbilicus where she had been previously marked by the stoma nurse. Once down to the fascia we performed a cruciate incision and made sure that the opening was big enough to pass 2 fingers through. We then gently pulled the distal aspect of the ileal loop through the opening. We secured this stoma to the fascia using chromic sutures. We then matured the ostomy with 3 oh Vicryls in the corners and 4-0 Vicryl's in between. At this point we placed an 68 [place] Blake drain in the left lower quadrant and pulled across to the conduit. This was secured to the skin with 3-0 nylon. We then copiously irrigated the patient's abdomen and evaluated the anastomosis again which appeared to be in excellent condition. We then closed the patient's fascia with a looped PDS starting on each end and meeting in the middle. We then injected the incision with Exparel.  The skin was closed with skin staples. An ostomy appliance was applied. 4 x 4 dressings and tape was used to dress the midline incision. The drain was also addressed.  The patient was subsequently extubated and returned to the PACU in stable condition. At the area case, all laps, needles, and sponges had been accounted for. Ardis Hughs, M.D.

## 2016-09-03 NOTE — Progress Notes (Signed)
Dr. Kalman Shan and Dr. Louis Meckel consulted regarding heart rate and blood pressure and pt going to regular hospital floor; pt is stable, vital signs as per preop, OK for pt to go to floor

## 2016-09-03 NOTE — Transfer of Care (Signed)
Immediate Anesthesia Transfer of Care Note  Patient: Deanna Baldwin  Procedure(s) Performed: Procedure(s): ILEAL CONDUIT  URINARY DIVERSION OPEN (N/A)  Patient Location: PACU  Anesthesia Type:General  Level of Consciousness:  sedated, patient cooperative and responds to stimulation  Airway & Oxygen Therapy:Patient Spontanous Breathing and Patient connected to face mask oxgen  Post-op Assessment:  Report given to PACU RN and Post -op Vital signs reviewed and stable  Post vital signs:  Reviewed and stable  Last Vitals:  Vitals:   09/03/16 0527 09/03/16 0608  BP: (!) 93/53   Pulse: 89   Resp: 17 19  Temp: 26.7 C     Complications: No apparent anesthesia complications

## 2016-09-03 NOTE — Progress Notes (Signed)
UROLOGY PROGRESS NOTES  Assessment:  Deanna Baldwin is a 41 y.o. female with a history of progressive MS, neurogenic bladder, decubitus ulcers and eroded bladder neck. She was admitted 7/30, preadmitted prior to planned ileal conduit urinary diversion for bowel prep and antibiotics. On arrival, patient was febrile, tachycardic and hypotensive. Vanc/zosyn initiated, lactate 2.4. Bacteremic from wounds possibly, continues on broad spectrum abx, looks better/feels better.     Interval:  No growth on recent cultures.  Feels well.  Had bowel movement yesterday for bowel prep.  Plan to proceed with ileal conduit.     -------   Objective:  Vital signs in last 24 hours: Temp:  [97.9 F (36.6 C)-99 F (37.2 C)] 99 F (37.2 C) (08/03 0527) Pulse Rate:  [83-92] 89 (08/03 0527) Resp:  [16-20] 19 (08/03 0608) BP: (93-113)/(53-74) 93/53 (08/03 0527) SpO2:  [98 %-100 %] 98 % (08/03 0527)  08/02 0701 - 08/03 0700 In: 2980 [P.O.:230; I.V.:2500; IV Piggyback:250] Out: 6295 [Urine:1550]   Physical Exam:  General:  well-developed and well-nourished  in NAD, lying in bed, alert & oriented, pleasant HEENT: [place]/AT, EOMI, sclera anicteric, hearing grossly intact, no nasal discharge, MMM Respiratory: nonlabored respirations, satting well on RA, symmetrical chest rise Cardiovascular: appropriate perfusion  Abdominal: soft, NTTP, nondistended. Stoma marking in place GU: foley catheter in place to suction   Extremities: warm, well-perfused, no c/c/e Neuro: no focal deficits  Data Review: Results for orders placed or performed during the hospital encounter of 08/30/16 (from the past 24 hour(s))  Vancomycin, trough     Status: Abnormal   Collection Time: 09/02/16  7:16 PM  Result Value Ref Range   Vancomycin Tr <4 (L) 15 - 20 ug/mL  Magnesium     Status: None   Collection Time: 09/03/16  5:08 AM  Result Value Ref Range   Magnesium 1.9 1.7 - 2.4 mg/dL  Type and screen [place]      Status: None   Collection Time: 09/03/16  5:08 AM  Result Value Ref Range   ABO/RH(D) AB POS    Antibody Screen NEG    Sample Expiration 09/06/2016   CBC     Status: Abnormal   Collection Time: 09/03/16  5:08 AM  Result Value Ref Range   WBC 4.9 4.0 - 10.5 K/uL   RBC 3.07 (L) 3.87 - 5.11 MIL/uL   Hemoglobin 8.0 (L) 12.0 - 15.0 g/dL   HCT 26.0 (L) 36.0 - 46.0 %   MCV 84.7 78.0 - 100.0 fL   MCH 26.1 26.0 - 34.0 pg   MCHC 30.8 30.0 - 36.0 g/dL   RDW 14.8 11.5 - 15.5 %   Platelets 606 (H) 150 - 400 K/uL  Comprehensive metabolic panel     Status: Abnormal   Collection Time: 09/03/16  5:08 AM  Result Value Ref Range   Sodium 141 135 - 145 mmol/L   Potassium 4.0 3.5 - 5.1 mmol/L   Chloride 106 101 - 111 mmol/L   CO2 26 22 - 32 mmol/L   Glucose, Bld 83 65 - 99 mg/dL   BUN 6 6 - 20 mg/dL   Creatinine, Ser <0.30 (L) 0.44 - 1.00 mg/dL   Calcium 9.1 8.9 - 10.3 mg/dL   Total Protein 6.3 (L) 6.5 - 8.1 g/dL   Albumin 3.0 (L) 3.5 - 5.0 g/dL   AST 20 15 - 41 U/L   ALT 32 14 - 54 U/L   Alkaline Phosphatase 113 38 - 126 U/L  Total Bilirubin 0.3 0.3 - 1.2 mg/dL   GFR calc non Af Amer NOT CALCULATED >60 mL/min   GFR calc Af Amer NOT CALCULATED >60 mL/min   Anion gap 9 5 - 15    Imaging:

## 2016-09-03 NOTE — Anesthesia Procedure Notes (Signed)
Procedure Name: Intubation Date/Time: 09/03/2016 7:52 AM Performed by: Carleene Cooper A Pre-anesthesia Checklist: Patient identified, Emergency Drugs available, Suction available and Patient being monitored Patient Re-evaluated:Patient Re-evaluated prior to induction Oxygen Delivery Method: Circle system utilized Preoxygenation: Pre-oxygenation with 100% oxygen Induction Type: IV induction Ventilation: Mask ventilation without difficulty Laryngoscope Size: Mac and 3 Grade View: Grade I Tube type: Oral Number of attempts: 1 Airway Equipment and Method: Stylet Placement Confirmation: ETT inserted through vocal cords under direct vision,  positive ETCO2 and breath sounds checked- equal and bilateral Secured at: 22 cm Tube secured with: Tape Dental Injury: Teeth and Oropharynx as per pre-operative assessment

## 2016-09-03 NOTE — Anesthesia Preprocedure Evaluation (Signed)
Anesthesia Evaluation  Patient identified by MRN, date of birth, ID band Patient awake    Reviewed: Allergy & Precautions, NPO status , Patient's Chart, lab work & pertinent test results  Airway Mallampati: II  TM Distance: <3 FB Neck ROM: Limited  Mouth opening: Limited Mouth Opening  Dental no notable dental hx.    Pulmonary neg pulmonary ROS, former smoker,    Pulmonary exam normal breath sounds clear to auscultation       Cardiovascular negative cardio ROS Normal cardiovascular exam Rhythm:Regular Rate:Normal     Neuro/Psych MS  Neuromuscular disease negative psych ROS   GI/Hepatic negative GI ROS, Neg liver ROS,   Endo/Other  negative endocrine ROS  Renal/GU negative Renal ROS  negative genitourinary   Musculoskeletal negative musculoskeletal ROS (+)   Abdominal   Peds negative pediatric ROS (+)  Hematology  (+) anemia ,   Anesthesia Other Findings   Reproductive/Obstetrics negative OB ROS                             Anesthesia Physical Anesthesia Plan  ASA: III  Anesthesia Plan: General   Post-op Pain Management:    Induction: Intravenous  PONV Risk Score and Plan: 2 and Ondansetron, Dexamethasone and Treatment may vary due to age or medical condition  Airway Management Planned: Oral ETT and Video Laryngoscope Planned  Additional Equipment:   Intra-op Plan:   Post-operative Plan: Possible Post-op intubation/ventilation  Informed Consent: I have reviewed the patients History and Physical, chart, labs and discussed the procedure including the risks, benefits and alternatives for the proposed anesthesia with the patient or authorized representative who has indicated his/her understanding and acceptance.     Plan Discussed with: CRNA and Surgeon  Anesthesia Plan Comments:         Anesthesia Quick Evaluation

## 2016-09-04 LAB — BASIC METABOLIC PANEL
ANION GAP: 9 (ref 5–15)
BUN: 5 mg/dL — ABNORMAL LOW (ref 6–20)
CO2: 27 mmol/L (ref 22–32)
Calcium: 8.7 mg/dL — ABNORMAL LOW (ref 8.9–10.3)
Chloride: 106 mmol/L (ref 101–111)
Creatinine, Ser: <0.3 mg/dL — ABNORMAL LOW (ref 0.44–1.00)
GLUCOSE: 93 mg/dL (ref 65–99)
Potassium: 3.9 mmol/L (ref 3.5–5.1)
Sodium: 142 mmol/L (ref 135–145)

## 2016-09-04 LAB — MAGNESIUM: Magnesium: 1.8 mg/dL (ref 1.7–2.4)

## 2016-09-04 MED ORDER — CIPROFLOXACIN IN D5W 400 MG/200ML IV SOLN
400.0000 mg | Freq: Two times a day (BID) | INTRAVENOUS | Status: DC
  Administered 2016-09-04 (×2): 400 mg via INTRAVENOUS
  Filled 2016-09-04 (×2): qty 200

## 2016-09-04 MED ORDER — SODIUM CHLORIDE 0.9 % IV BOLUS (SEPSIS)
500.0000 mL | Freq: Once | INTRAVENOUS | Status: AC
  Administered 2016-09-04: 500 mL via INTRAVENOUS

## 2016-09-04 NOTE — Progress Notes (Signed)
Patient ID: Deanna Baldwin, female   DOB: 07/22/1975, 41 y.o.   MRN: [id number]  1 Day Post-Op Subjective: Pt feels well s/p ileal conduit yesterday.  Denies nausea or vomiting.  Tolerating clears.  Hypotensive this morning and received 500 cc saline bolus.  Objective: Vital signs in last 24 hours: Temp:  [96.6 F (35.9 C)-98.3 F (36.8 C)] 98.3 F (36.8 C) (08/04 0614) Pulse Rate:  [97-125] 97 (08/04 0614) Resp:  [10-22] 22 (08/04 0614) BP: (80-133)/(40-91) 80/62 (08/04 0659) SpO2:  [99 %-100 %] 100 % (08/04 0614)  Intake/Output from previous day: 08/03 0701 - 08/04 0700 In: 5416.7 [I.V.:4516.7; IV Piggyback:900] Out: 1115 [Urine:625; Drains:290; Blood:200] Intake/Output this shift: No intake/output data recorded.  Physical Exam:  General: Alert and oriented CV: RRR Lungs: Clear Abdomen: Soft, ND, urostomy pink, urine clear and draining well, No bowel sounds. Incisions: Dressing intact, reinforced last night. Ext: NT, No erythema  Lab Results:  Recent Labs  09/03/16 0508 09/03/16 1335  HGB 8.0* 10.6*  HCT 26.0* 35.2*   CBC Latest Ref Rng & Units 09/03/2016 09/03/2016 09/01/2016  WBC 4.0 - 10.5 K/uL - 4.9 6.9  Hemoglobin 12.0 - 15.0 g/dL 10.6(L) 8.0(L) 8.3(L)  Hematocrit 36.0 - 46.0 % 35.2(L) 26.0(L) 26.9(L)  Platelets 150 - 400 K/uL - 606(H) 502(H)     BMET  Recent Labs  09/03/16 1335 09/04/16 0520  NA 145 142  K 4.0 3.9  CL 106 106  CO2 25 27  GLUCOSE 155* 93  BUN <5* 5*  CREATININE 0.45 <0.30*  CALCIUM 9.8 8.7*     Studies/Results: No results found.  Assessment/Plan: POD # 1 s/p ileal conduit urinary diversion  1) S/P ileal conduit: Clears, await return of bowel function, on alvimopan.  Progress diet as tolerated over next few days. Continue drain. 2) Bacteremia:  After 24 hours of skin prophylaxis with vancomycin for perioperative coverage, will switch to ciprofloxacin alone based on culture results.  [facility] care. 3) Wound  care:  Appreciate WOC nursing. 4) Anemia: Has chronic anemia (baseline Hgb 8 before surgery).  Awaiting lab results today.     LOS: 5 days   Corneluis Allston,LES 09/04/2016, 10:01 AM

## 2016-09-04 NOTE — Progress Notes (Signed)
Patient's midline and JP dressings had to be changed overnight due to incision site leaking. Will continue to monitor patient.

## 2016-09-04 NOTE — Progress Notes (Signed)
PROGRESS NOTE    Jeneal Vogl  XQJ:[id number] DOB: 04-Aug-1975 DOA: 08/30/2016 PCP: [name], DO   Brief Narrative:   41 year old female with past medical history of multiple sclerosis and neurogenic bladder presented to the ER with UTI/urosepsis. Patient was started on vancomycin and Zosyn and admitted to urology service. Medicine team was consulted due to hypotension and fever.  Assessment & Plan:   Principal Problem:   Hypotension Active Problems:   UTI (urinary tract infection)   Neurogenic bladder   Sepsis (HCC)   Fever, unspecified   Abnormal liver function   Pressure injury of skin  Hypotension/fever; improved  Sepsis secondary to likely urinary tract infection -Cultures have been ordered and one of the cultures grew Staphylococcus hominis. Repeat cultures have been ordered -NGTD @ 2 days.  -Vancomycin and Zosyn has been stopped. Switch to Cipro, can be transitioned to oral when tolerating by mouth. Should continue for 3 days. -CTA of the chest done 7/31- was negative for pulmonary embolism, but showed atelectasis with possible early pneumonia and chronic bronchitis. -A.m. cortisol normal-15 -Echocardiogram done on 08/31/2016 shows normal ejection fraction. Low suspicion for endocarditis.  -Her current blood pressure of around systolic 448-185 is at her baseline. Closely monitor urine output.  Another 500 mL of normal saline bolus given today due to an episode of hypotension this morning. -POD #1 - ileal conduit urinary diversion  History of neurogenic bladder; STABLE  -This is likely secondary to her multiple sclerosis. Management per urology  Multiple Sclerosis- stable   Moderate to severe protein calorie malnutrition -Encourage oral intake.   Bowel regimen prn.  Advise nursing staff to place her in the chair as much as possible throughout the day. And also get a physical therapy evaluation  DVT prophylaxis: Heparin Code Status: Full code Family  Communication:  None at bedside Disposition Plan: Per urology   Antimicrobials:   Vanc  8/1> 8/4  Zosyn 8/1 > 8/4  Cipro 8/4>   Subjective: Patient is no any complaints this morning. She is willing to try oral diet. This morning had an episode of hypotension with systolic and 63J therefore find her 500cc of bolus was given. She remains asymptomatic.  Objective: Vitals:   09/03/16 2250 09/04/16 0614 09/04/16 0659 09/04/16 0830  BP: (!) 133/91 (!) 87/50 (!) 80/62 (!) 93/56  Pulse: (!) 109 97    Resp: 20 (!) 22    Temp: 98.3 F (36.8 C) 98.3 F (36.8 C)    TempSrc: Oral Oral    SpO2: 100% 100%    Weight:      Height:        Intake/Output Summary (Last 24 hours) at 09/04/16 1042 Last data filed at 09/04/16 1000  Gross per 24 hour  Intake          5416.67 ml  Output              968 ml  Net          4448.67 ml   Filed Weights   08/30/16 1214  Weight: 38.5 kg (84 lb 14.4 oz)    Examination:  General exam: calm and comfortable this morning, frail thin cachectic appearing.  Respiratory system: Clear to auscultation. Respiratory effort normal. Cardiovascular system: tachycardiac S1 & S2 heard, RRR. No JVD, murmurs, rubs, gallops or clicks. No pedal edema. Gastrointestinal system: Abdomen is nondistended, soft and nontender. No organomegaly or masses felt. Normal bowel sounds heard. Ostomy bag in place Central nervous system: Alert and oriented. No  focal neurological deficits. Extremities: Symmetric 5 x 5 power. Skin: No rashes, lesions or ulcers Psychiatry: Judgement and insight appear normal. Mood & affect appropriate.    Data Reviewed:   CBC:  Recent Labs Lab 08/30/16 1430 08/31/16 0259 09/01/16 0454 09/03/16 0508 09/03/16 1335  WBC 12.2* 9.7 6.9 4.9  --   HGB 12.1 9.0* 8.3* 8.0* 10.6*  HCT 39.9 29.4* 26.9* 26.0* 35.2*  MCV 85.6 83.8 84.3 84.7  --   PLT 603* 457* 502* 606*  --    Basic Metabolic Panel:  Recent Labs Lab 09/01/16 1025 09/02/16 0434  09/03/16 0508 09/03/16 1335 09/04/16 0520  NA 141 142 141 145 142  K 3.6 3.6 4.0 4.0 3.9  CL 105 109 106 106 106  CO2 26 26 26 25 27   GLUCOSE 158* 95 83 155* 93  BUN <5* 5* 6 <5* 5*  CREATININE <0.30* <0.30* <0.30* 0.45 <0.30*  CALCIUM 8.7* 8.9 9.1 9.8 8.7*  MG  --  1.9 1.9  --  1.8   GFR: CrCl cannot be calculated (This lab value cannot be used to calculate CrCl because it is not a number: <0.30). Liver Function Tests:  Recent Labs Lab 08/31/16 0112 09/03/16 0508  AST 60* 20  ALT 62* 32  ALKPHOS 134* 113  BILITOT 0.4 0.3  PROT 5.7* 6.3*  ALBUMIN 2.1* 3.0*   No results for input(s): LIPASE, AMYLASE in the last 168 hours. No results for input(s): AMMONIA in the last 168 hours. Coagulation Profile: No results for input(s): INR, PROTIME in the last 168 hours. Cardiac Enzymes:  Recent Labs Lab 08/31/16 0259 08/31/16 1155 08/31/16 1449  TROPONINI <0.03 <0.03 <0.03   BNP (last 3 results) No results for input(s): PROBNP in the last 8760 hours. HbA1C: No results for input(s): HGBA1C in the last 72 hours. CBG: No results for input(s): GLUCAP in the last 168 hours. Lipid Profile: No results for input(s): CHOL, HDL, LDLCALC, TRIG, CHOLHDL, LDLDIRECT in the last 72 hours. Thyroid Function Tests: No results for input(s): TSH, T4TOTAL, FREET4, T3FREE, THYROIDAB in the last 72 hours. Anemia Panel: No results for input(s): VITAMINB12, FOLATE, FERRITIN, TIBC, IRON, RETICCTPCT in the last 72 hours. Sepsis Labs:  Recent Labs Lab 08/31/16 0112 09/01/16 0454  LATICACIDVEN 2.4* 0.9    Recent Results (from the past 240 hour(s))  Surgical pcr screen     Status: Abnormal   Collection Time: 08/30/16  1:03 PM  Result Value Ref Range Status   MRSA, PCR POSITIVE (A) NEGATIVE Corrected    Comment: RESULT CALLED TO, READ BACK BY AND VERIFIED WITH: ORA,I. RN ON 07.30.18 AT 1615 BY INGLEE CORRECTED ON 07/30 AT 1614: PREVIOUSLY REPORTED AS POSITIVE RESULT CALLED TO, READ BACK BY  AND VERIFIED WITH: ORA,I RN ON 07.30.18 AT 1415 BY INGLEE    Staphylococcus aureus POSITIVE (A) NEGATIVE Final    Comment:        The Xpert SA Assay (FDA approved for NASAL specimens in patients over 61 years of age), is one component of a comprehensive surveillance program.  Test performance has been validated by [facility] for patients greater than or equal to 34 year old. It is not intended to diagnose infection nor to guide or monitor treatment.   Culture, blood (routine x 2)     Status: Abnormal   Collection Time: 08/30/16  7:16 PM  Result Value Ref Range Status   Specimen Description BLOOD BLOOD RIGHT HAND  Final   Special Requests IN PEDIATRIC BOTTLE Blood  Culture adequate volume  Final   Culture  Setup Time   Final    GRAM POSITIVE COCCI IN CLUSTERS IN PEDIATRIC BOTTLE CRITICAL RESULT CALLED TO, READ BACK BY AND VERIFIED WITH: A [facility] PHARMD 1928 08/31/16 A BROWNING Performed at [facility], [address] [place], [place] [address]    Culture STAPHYLOCOCCUS HOMINIS (A)  Final   Report Status 09/02/2016 FINAL  Final   Organism ID, Bacteria STAPHYLOCOCCUS HOMINIS  Final      Susceptibility   Staphylococcus hominis - MIC*    CIPROFLOXACIN <=0.5 SENSITIVE Sensitive     ERYTHROMYCIN <=0.25 SENSITIVE Sensitive     GENTAMICIN <=0.5 SENSITIVE Sensitive     OXACILLIN 0.5 RESISTANT Resistant     TETRACYCLINE >=16 RESISTANT Resistant     VANCOMYCIN 1 SENSITIVE Sensitive     TRIMETH/SULFA 80 RESISTANT Resistant     CLINDAMYCIN <=0.25 SENSITIVE Sensitive     RIFAMPIN <=0.5 SENSITIVE Sensitive     Inducible Clindamycin NEGATIVE Sensitive     * STAPHYLOCOCCUS HOMINIS  Culture, blood (routine x 2)     Status: Abnormal   Collection Time: 08/30/16  7:16 PM  Result Value Ref Range Status   Specimen Description BLOOD BLOOD RIGHT ARM  Final   Special Requests IN PEDIATRIC BOTTLE Blood Culture adequate volume  Final   Culture  Setup Time   Final    GRAM POSITIVE COCCI IN  CLUSTERS IN PEDIATRIC BOTTLE CRITICAL RESULT CALLED TO, READ BACK BY AND VERIFIED WITH: A [facility] PHARMD 1928 08/31/16 A BROWNING Performed at [facility], [place] [address], [place], [place] [address]    Culture STAPHYLOCOCCUS HOMINIS (A)  Final   Report Status 09/02/2016 FINAL  Final  Blood Culture ID Panel (Reflexed)     Status: Abnormal   Collection Time: 08/30/16  7:16 PM  Result Value Ref Range Status   Enterococcus species NOT DETECTED NOT DETECTED Final   Listeria monocytogenes NOT DETECTED NOT DETECTED Final   Staphylococcus species DETECTED (A) NOT DETECTED Final    Comment: Methicillin (oxacillin) resistant coagulase negative staphylococcus. Possible blood culture contaminant (unless isolated from more than one blood culture draw or clinical case suggests pathogenicity). No antibiotic treatment is indicated for blood  culture contaminants. CRITICAL RESULT CALLED TO, READ BACK BY AND VERIFIED WITH: A [facility] PHARMD 1928 08/31/16 A BROWNING    Staphylococcus aureus NOT DETECTED NOT DETECTED Final   Methicillin resistance DETECTED (A) NOT DETECTED Final    Comment: CRITICAL RESULT CALLED TO, READ BACK BY AND VERIFIED WITH: A [facility] PHARMD 1928 08/31/16 A BROWNING    Streptococcus species NOT DETECTED NOT DETECTED Final   Streptococcus agalactiae NOT DETECTED NOT DETECTED Final   Streptococcus pneumoniae NOT DETECTED NOT DETECTED Final   Streptococcus pyogenes NOT DETECTED NOT DETECTED Final   Acinetobacter baumannii NOT DETECTED NOT DETECTED Final   Enterobacteriaceae species NOT DETECTED NOT DETECTED Final   Enterobacter cloacae complex NOT DETECTED NOT DETECTED Final   Escherichia coli NOT DETECTED NOT DETECTED Final   Klebsiella oxytoca NOT DETECTED NOT DETECTED Final   Klebsiella pneumoniae NOT DETECTED NOT DETECTED Final   Proteus species NOT DETECTED NOT DETECTED Final   Serratia marcescens NOT DETECTED NOT DETECTED Final   Haemophilus influenzae NOT DETECTED NOT DETECTED  Final   Neisseria meningitidis NOT DETECTED NOT DETECTED Final   Pseudomonas aeruginosa NOT DETECTED NOT DETECTED Final   Candida albicans NOT DETECTED NOT DETECTED Final   Candida glabrata NOT DETECTED NOT DETECTED Final  Candida krusei NOT DETECTED NOT DETECTED Final   Candida parapsilosis NOT DETECTED NOT DETECTED Final   Candida tropicalis NOT DETECTED NOT DETECTED Final    Comment: Performed at [facility], [place] [address], [place], [place] [address]  Urine culture     Status: Abnormal   Collection Time: 08/31/16  3:26 AM  Result Value Ref Range Status   Specimen Description URINE, RANDOM  Final   Special Requests NONE  Final   Culture (A)  Final    <10,000 COLONIES/mL INSIGNIFICANT GROWTH Performed at [facility] [address] [place], [place] [address]    Report Status 09/01/2016 FINAL  Final  Culture, blood (routine x 2)     Status: None (Preliminary result)   Collection Time: 09/01/16 10:25 AM  Result Value Ref Range Status   Specimen Description BLOOD LEFT ARM  Final   Special Requests IN PEDIATRIC BOTTLE Blood Culture adequate volume  Final   Culture   Final    NO GROWTH 2 DAYS Performed at [facility], [place] [address] [place], [place] [address]    Report Status PENDING  Incomplete  Culture, blood (routine x 2)     Status: None (Preliminary result)   Collection Time: 09/01/16 10:25 AM  Result Value Ref Range Status   Specimen Description BLOOD LEFT ARM  Final   Special Requests IN PEDIATRIC BOTTLE Blood Culture adequate volume  Final   Culture   Final    NO GROWTH 2 DAYS Performed at [facility], [place] [address], [place], [place] [address]    Report Status PENDING  Incomplete         Radiology Studies: No results found.      Scheduled Meds: . alvimopan  12 mg Oral BID  . chlorhexidine  15 mL Mouth Rinse BID  . heparin subcutaneous  5,000 Units Subcutaneous Q8H  . ketorolac  15 mg Intravenous Q6H  . mouth rinse  15 mL  Mouth Rinse q12n4p  . mupirocin ointment  1 application Nasal BID   Continuous Infusions: . 0.9 % NaCl with KCl 20 mEq / L 75 mL/hr at 09/03/16 1740  . acetaminophen 1,000 mg (09/04/16 0603)  . ciprofloxacin Stopped (09/04/16 0941)     LOS: 5 days    Time spent: 30 mins     Macoy Rodwell Arsenio Loader, MD [facility] Pager [phone]   If 7PM-7AM, please contact night-coverage [email or web address] Password [email or web address] 09/04/2016, 10:42 AM

## 2016-09-04 NOTE — Progress Notes (Signed)
PT Cancellation Note  Patient Details Name: Cary Wilford MRN: [id number] DOB: 03/12/1975   Cancelled Treatment:    Reason Eval/Treat Not Completed: PT screened, no needs identified, will sign off (pt is bedbound at baseline, from SNF, lift used for transfers. Pt at baseline, will sign off. Recommend nursing use lift for bed to recliner transfers. )   Philomena Doheny 09/04/2016, 10:20 AM  [phone]

## 2016-09-04 NOTE — Progress Notes (Signed)
Rounding hospitalist notified of patient's low blood pressure. New order received to give a 500cc NS bolus. Day shift RN made aware of patient's blood pressure . Will continue to monitor patient.

## 2016-09-04 NOTE — Progress Notes (Signed)
OT Cancellation Note  Patient Details Name: Deanna Baldwin MRN: [id number] DOB: 23-Apr-1975   Cancelled Treatment:    Reason Eval/Treat Not Completed: OT screened, no needs identified, will sign off. Noted pt from SNF and bed bound prior to admission. No skilled OT needs. Will sign off.  Jae Dire Darbie Biancardi 09/04/2016, 2:57 PM

## 2016-09-05 ENCOUNTER — Inpatient Hospital Stay (HOSPITAL_COMMUNITY): Payer: Medicare Other

## 2016-09-05 DIAGNOSIS — L89309 Pressure ulcer of unspecified buttock, unspecified stage: Secondary | ICD-10-CM

## 2016-09-05 LAB — CBC
HEMATOCRIT: 32 % — AB (ref 36.0–46.0)
Hemoglobin: 9.6 g/dL — ABNORMAL LOW (ref 12.0–15.0)
MCH: 25.5 pg — ABNORMAL LOW (ref 26.0–34.0)
MCHC: 30 g/dL (ref 30.0–36.0)
MCV: 84.9 fL (ref 78.0–100.0)
PLATELETS: 528 10*3/uL — AB (ref 150–400)
RBC: 3.77 MIL/uL — ABNORMAL LOW (ref 3.87–5.11)
RDW: 15.4 % (ref 11.5–15.5)
WBC: 10.7 10*3/uL — ABNORMAL HIGH (ref 4.0–10.5)

## 2016-09-05 NOTE — Progress Notes (Addendum)
Patient ID: Deanna Baldwin, female   DOB: 11-Mar-1975, 41 y.o.   MRN: [id number]  2 Days Post-Op Subjective: Pt states she is tolerating clears well without nausea and vomiting.  Denies much incisional pain. Worked with PT and OT yesterday.  She states she has been passing flatus.  Objective: Vital signs in last 24 hours: Temp:  [97.7 F (36.5 C)-99.1 F (37.3 C)] 99.1 F (37.3 C) (08/05 0534) Pulse Rate:  [94-100] 100 (08/05 0534) Resp:  [18-20] 18 (08/05 0534) BP: (94-97)/(59-61) 97/59 (08/05 0534) SpO2:  [98 %-100 %] 98 % (08/05 0534)  Intake/Output from previous day: 08/04 0701 - 08/05 0700 In: 3110 [P.O.:1060; I.V.:1650; IV Piggyback:400] Out: 1133 [Urine:800; Drains:333] Intake/Output this shift: No intake/output data recorded.  Physical Exam:  General: Alert and oriented CV: RRR Lungs: Clear Abdomen: Very tight and hard.  Some bowel sounds. Urostomy pink and urine is clear. Incisions: C/D/I - dressing removed. Ext: NT, No erythema  Lab Results:  Recent Labs  09/03/16 0508 09/03/16 1335  HGB 8.0* 10.6*  HCT 26.0* 35.2*   BMET  Recent Labs  09/03/16 1335 09/04/16 0520  NA 145 142  K 4.0 3.9  CL 106 106  CO2 25 27  GLUCOSE 155* 93  BUN <5* 5*  CREATININE 0.45 <0.30*  CALCIUM 9.8 8.7*     Studies/Results: No results found.  Assessment/Plan: POD # 2 s/p ileal conduit urinary diversion  1) S/P ileal conduit: Abdomen is quite rigid and hard today.  Will make NPO and check abdominal films. Continue drain. 2) Bacteremia: Antibiotics stopped per IM. [facility] care. 3) Wound care:  Appreciate WOC nursing. 4) Anemia: Has chronic anemia (baseline Hgb 8 before surgery).  Will check CBC today. 5) Contineu PT/OT     LOS: 6 days   Delayza Lungren,LES 09/05/2016, 12:11 PM

## 2016-09-05 NOTE — Plan of Care (Signed)
Problem: Skin Integrity: Goal: Risk for impaired skin integrity will decrease Outcome: Progressing Pt refused sometimes refuses turns. Pt aware of risks but states she is uncomfortable on right side. Needs encouragement.

## 2016-09-05 NOTE — Progress Notes (Signed)
Pt agreeable to dressing changes. Pain at pain goal 3/10. Refuses PRN pain meds. Tolerated dressing changes well. No complaints during dressing changes. Afterwards c/o 7/10 generalized pain from reposition and dressing change. PRN pain meds administered. Effective

## 2016-09-05 NOTE — Progress Notes (Signed)
PROGRESS NOTE    Deanna Baldwin  GUR:[id number] DOB: 1976-01-12 DOA: 08/30/2016 PCP: [name], DO   Brief Narrative:   41 year old female with past medical history of multiple sclerosis and neurogenic bladder presented to the ER with UTI/urosepsis. Patient was started on vancomycin and Zosyn and admitted to urology service. Medicine team was consulted due to hypotension and fever.  Assessment & Plan:   Principal Problem:   Hypotension Active Problems:   UTI (urinary tract infection)   Neurogenic bladder   Sepsis (HCC)   Fever, unspecified   Abnormal liver function   Pressure injury of skin  Hypotension/fever; improved  Sepsis secondary to likely urinary tract infection -Cultures have been ordered and one of the cultures grew Staphylococcus hominis. Repeat cultures have been ordered -NGTD @ 3 days.  -Vancomycin and Zosyn has been stopped. Abx discontinued  -CTA of the chest done 7/31- was negative for pulmonary embolism, but showed atelectasis with possible early pneumonia and chronic bronchitis. -A.m. cortisol normal-15 -Echocardiogram done on 08/31/2016 shows normal ejection fraction. -Her current blood pressure of around systolic 283-151 is at her baseline. Closely monitor urine output.   -POD #2 - ileal conduit urinary diversion. Adv diet as tolerated   History of neurogenic bladder; STABLE  -This is likely secondary to her multiple sclerosis. Management per urology  Multiple Sclerosis- stable   Moderate to severe protein calorie malnutrition -Encourage oral intake.   Bowel regimen prn.   DVT prophylaxis: Heparin Code Status: Full code Family Communication:  None at bedside Disposition Plan: Per urology. Adv diet prn    Antimicrobials:   Vanc  8/1> 8/4  Zosyn 8/1 > 8/4  Cipro 8/4>8/5   Subjective: No complaints. Tolerating PO liquid diet.   Objective: Vitals:   09/04/16 1100 09/04/16 1515 09/04/16 2249 09/05/16 0534  BP: (!) 93/55 95/61 94/61  (!)  97/59  Pulse: 92 95 94 100  Resp: 20 20 18 18   Temp: 97.6 F (36.4 C) 97.7 F (36.5 C) 98.1 F (36.7 C) 99.1 F (37.3 C)  TempSrc: Oral Oral Oral Oral  SpO2: 100% 100% 100% 98%  Weight:      Height:        Intake/Output Summary (Last 24 hours) at 09/05/16 1114 Last data filed at 09/05/16 0500  Gross per 24 hour  Intake             2910 ml  Output             1130 ml  Net             1780 ml   Filed Weights   08/30/16 1214  Weight: 38.5 kg (84 lb 14.4 oz)    Examination:  General exam: calm and comfortable this morning, frail thin cachectic appearing.  Respiratory system: Clear to auscultation. Respiratory effort normal. Cardiovascular system: tachycardiac S1 & S2 heard, RRR. No JVD, murmurs, rubs, gallops or clicks. No pedal edema. Gastrointestinal system: Abdomen is nondistended, soft and nontender. No organomegaly or masses felt. Normal bowel sounds heard. Ostomy bag in place Central nervous system: Alert and oriented. No focal neurological deficits. Extremities: Symmetric 5 x 5 power. Skin: No rashes, lesions or ulcers Psychiatry: Judgement and insight appear normal. Mood & affect appropriate.    Data Reviewed:   CBC:  Recent Labs Lab 08/30/16 1430 08/31/16 0259 09/01/16 0454 09/03/16 0508 09/03/16 1335  WBC 12.2* 9.7 6.9 4.9  --   HGB 12.1 9.0* 8.3* 8.0* 10.6*  HCT 39.9 29.4* 26.9* 26.0* 35.2*  MCV 85.6 83.8 84.3 84.7  --   PLT 603* 457* 502* 606*  --    Basic Metabolic Panel:  Recent Labs Lab 09/01/16 1025 09/02/16 0434 09/03/16 0508 09/03/16 1335 09/04/16 0520  NA 141 142 141 145 142  K 3.6 3.6 4.0 4.0 3.9  CL 105 109 106 106 106  CO2 26 26 26 25 27   GLUCOSE 158* 95 83 155* 93  BUN <5* 5* 6 <5* 5*  CREATININE <0.30* <0.30* <0.30* 0.45 <0.30*  CALCIUM 8.7* 8.9 9.1 9.8 8.7*  MG  --  1.9 1.9  --  1.8   GFR: CrCl cannot be calculated (This lab value cannot be used to calculate CrCl because it is not a number: <0.30). Liver Function  Tests:  Recent Labs Lab 08/31/16 0112 09/03/16 0508  AST 60* 20  ALT 62* 32  ALKPHOS 134* 113  BILITOT 0.4 0.3  PROT 5.7* 6.3*  ALBUMIN 2.1* 3.0*   No results for input(s): LIPASE, AMYLASE in the last 168 hours. No results for input(s): AMMONIA in the last 168 hours. Coagulation Profile: No results for input(s): INR, PROTIME in the last 168 hours. Cardiac Enzymes:  Recent Labs Lab 08/31/16 0259 08/31/16 1155 08/31/16 1449  TROPONINI <0.03 <0.03 <0.03   BNP (last 3 results) No results for input(s): PROBNP in the last 8760 hours. HbA1C: No results for input(s): HGBA1C in the last 72 hours. CBG: No results for input(s): GLUCAP in the last 168 hours. Lipid Profile: No results for input(s): CHOL, HDL, LDLCALC, TRIG, CHOLHDL, LDLDIRECT in the last 72 hours. Thyroid Function Tests: No results for input(s): TSH, T4TOTAL, FREET4, T3FREE, THYROIDAB in the last 72 hours. Anemia Panel: No results for input(s): VITAMINB12, FOLATE, FERRITIN, TIBC, IRON, RETICCTPCT in the last 72 hours. Sepsis Labs:  Recent Labs Lab 08/31/16 0112 09/01/16 0454  LATICACIDVEN 2.4* 0.9    Recent Results (from the past 240 hour(s))  Surgical pcr screen     Status: Abnormal   Collection Time: 08/30/16  1:03 PM  Result Value Ref Range Status   MRSA, PCR POSITIVE (A) NEGATIVE Corrected    Comment: RESULT CALLED TO, READ BACK BY AND VERIFIED WITH: ORA,I. RN ON 07.30.18 AT 1615 BY INGLEE CORRECTED ON 07/30 AT 1614: PREVIOUSLY REPORTED AS POSITIVE RESULT CALLED TO, READ BACK BY AND VERIFIED WITH: ORA,I RN ON 07.30.18 AT 1415 BY INGLEE    Staphylococcus aureus POSITIVE (A) NEGATIVE Final    Comment:        The Xpert SA Assay (FDA approved for NASAL specimens in patients over 39 years of age), is one component of a comprehensive surveillance program.  Test performance has been validated by [facility] for patients greater than or equal to 61 year old. It is not intended to diagnose infection  nor to guide or monitor treatment.   Culture, blood (routine x 2)     Status: Abnormal   Collection Time: 08/30/16  7:16 PM  Result Value Ref Range Status   Specimen Description BLOOD BLOOD RIGHT HAND  Final   Special Requests IN PEDIATRIC BOTTLE Blood Culture adequate volume  Final   Culture  Setup Time   Final    GRAM POSITIVE COCCI IN CLUSTERS IN PEDIATRIC BOTTLE CRITICAL RESULT CALLED TO, READ BACK BY AND VERIFIED WITH: A [facility] PHARMD 1928 08/31/16 A BROWNING Performed at [facility], [place] [address] [place], [place] [address]    Culture STAPHYLOCOCCUS HOMINIS (A)  Final   Report Status 09/02/2016 FINAL  Final   Organism ID, Bacteria STAPHYLOCOCCUS HOMINIS  Final      Susceptibility   Staphylococcus hominis - MIC*    CIPROFLOXACIN <=0.5 SENSITIVE Sensitive     ERYTHROMYCIN <=0.25 SENSITIVE Sensitive     GENTAMICIN <=0.5 SENSITIVE Sensitive     OXACILLIN 0.5 RESISTANT Resistant     TETRACYCLINE >=16 RESISTANT Resistant     VANCOMYCIN 1 SENSITIVE Sensitive     TRIMETH/SULFA 80 RESISTANT Resistant     CLINDAMYCIN <=0.25 SENSITIVE Sensitive     RIFAMPIN <=0.5 SENSITIVE Sensitive     Inducible Clindamycin NEGATIVE Sensitive     * STAPHYLOCOCCUS HOMINIS  Culture, blood (routine x 2)     Status: Abnormal   Collection Time: 08/30/16  7:16 PM  Result Value Ref Range Status   Specimen Description BLOOD BLOOD RIGHT ARM  Final   Special Requests IN PEDIATRIC BOTTLE Blood Culture adequate volume  Final   Culture  Setup Time   Final    GRAM POSITIVE COCCI IN CLUSTERS IN PEDIATRIC BOTTLE CRITICAL RESULT CALLED TO, READ BACK BY AND VERIFIED WITH: A [facility] PHARMD 1928 08/31/16 A BROWNING Performed at [facility], [place] [address], [place], [place] [address]    Culture STAPHYLOCOCCUS HOMINIS (A)  Final   Report Status 09/02/2016 FINAL  Final  Blood Culture ID Panel (Reflexed)     Status: Abnormal   Collection Time: 08/30/16  7:16 PM  Result Value Ref Range Status   Enterococcus  species NOT DETECTED NOT DETECTED Final   Listeria monocytogenes NOT DETECTED NOT DETECTED Final   Staphylococcus species DETECTED (A) NOT DETECTED Final    Comment: Methicillin (oxacillin) resistant coagulase negative staphylococcus. Possible blood culture contaminant (unless isolated from more than one blood culture draw or clinical case suggests pathogenicity). No antibiotic treatment is indicated for blood  culture contaminants. CRITICAL RESULT CALLED TO, READ BACK BY AND VERIFIED WITH: A [facility] PHARMD 1928 08/31/16 A BROWNING    Staphylococcus aureus NOT DETECTED NOT DETECTED Final   Methicillin resistance DETECTED (A) NOT DETECTED Final    Comment: CRITICAL RESULT CALLED TO, READ BACK BY AND VERIFIED WITH: A [facility] PHARMD 1928 08/31/16 A BROWNING    Streptococcus species NOT DETECTED NOT DETECTED Final   Streptococcus agalactiae NOT DETECTED NOT DETECTED Final   Streptococcus pneumoniae NOT DETECTED NOT DETECTED Final   Streptococcus pyogenes NOT DETECTED NOT DETECTED Final   Acinetobacter baumannii NOT DETECTED NOT DETECTED Final   Enterobacteriaceae species NOT DETECTED NOT DETECTED Final   Enterobacter cloacae complex NOT DETECTED NOT DETECTED Final   Escherichia coli NOT DETECTED NOT DETECTED Final   Klebsiella oxytoca NOT DETECTED NOT DETECTED Final   Klebsiella pneumoniae NOT DETECTED NOT DETECTED Final   Proteus species NOT DETECTED NOT DETECTED Final   Serratia marcescens NOT DETECTED NOT DETECTED Final   Haemophilus influenzae NOT DETECTED NOT DETECTED Final   Neisseria meningitidis NOT DETECTED NOT DETECTED Final   Pseudomonas aeruginosa NOT DETECTED NOT DETECTED Final   Candida albicans NOT DETECTED NOT DETECTED Final   Candida glabrata NOT DETECTED NOT DETECTED Final   Candida krusei NOT DETECTED NOT DETECTED Final   Candida parapsilosis NOT DETECTED NOT DETECTED Final   Candida tropicalis NOT DETECTED NOT DETECTED Final    Comment: Performed at [facility],  [place]. [address] [place], [place] [address]  Urine culture     Status: Abnormal   Collection Time: 08/31/16  3:26 AM  Result Value Ref Range Status   Specimen Description  URINE, RANDOM  Final   Special Requests NONE  Final   Culture (A)  Final    <10,000 COLONIES/mL INSIGNIFICANT GROWTH Performed at [facility], [place] [address] [place], [place] [address]    Report Status 09/01/2016 FINAL  Final  Culture, blood (routine x 2)     Status: None (Preliminary result)   Collection Time: 09/01/16 10:25 AM  Result Value Ref Range Status   Specimen Description BLOOD LEFT ARM  Final   Special Requests IN PEDIATRIC BOTTLE Blood Culture adequate volume  Final   Culture   Final    NO GROWTH 3 DAYS Performed at [facility], [place] [address], [place], [place] [address]    Report Status PENDING  Incomplete  Culture, blood (routine x 2)     Status: None (Preliminary result)   Collection Time: 09/01/16 10:25 AM  Result Value Ref Range Status   Specimen Description BLOOD LEFT ARM  Final   Special Requests IN PEDIATRIC BOTTLE Blood Culture adequate volume  Final   Culture   Final    NO GROWTH 3 DAYS Performed at [facility], [place] [address], [place], [place] [address]    Report Status PENDING  Incomplete         Radiology Studies: No results found.      Scheduled Meds: . alvimopan  12 mg Oral BID  . chlorhexidine  15 mL Mouth Rinse BID  . heparin subcutaneous  5,000 Units Subcutaneous Q8H  . mouth rinse  15 mL Mouth Rinse q12n4p   Continuous Infusions: . 0.9 % NaCl with KCl 20 mEq / L 75 mL/hr (09/05/16 0145)     LOS: 6 days    Time spent: 30 mins     Laquincy Eastridge Arsenio Loader, MD [facility] Pager [phone]   If 7PM-7AM, please contact night-coverage [email or web address] Password [email or web address] 09/05/2016, 11:14 AM

## 2016-09-06 ENCOUNTER — Inpatient Hospital Stay (HOSPITAL_COMMUNITY): Payer: Medicare Other

## 2016-09-06 ENCOUNTER — Other Ambulatory Visit (HOSPITAL_COMMUNITY): Payer: Medicare Other

## 2016-09-06 DIAGNOSIS — I9589 Other hypotension: Secondary | ICD-10-CM

## 2016-09-06 DIAGNOSIS — R140 Abdominal distension (gaseous): Secondary | ICD-10-CM

## 2016-09-06 LAB — CULTURE, BLOOD (ROUTINE X 2)
Culture: NO GROWTH
Culture: NO GROWTH
SPECIAL REQUESTS: ADEQUATE
Special Requests: ADEQUATE

## 2016-09-06 LAB — CBC
HEMATOCRIT: 27.1 % — AB (ref 36.0–46.0)
Hemoglobin: 8.1 g/dL — ABNORMAL LOW (ref 12.0–15.0)
MCH: 25.6 pg — ABNORMAL LOW (ref 26.0–34.0)
MCHC: 29.9 g/dL — ABNORMAL LOW (ref 30.0–36.0)
MCV: 85.5 fL (ref 78.0–100.0)
PLATELETS: 572 10*3/uL — AB (ref 150–400)
RBC: 3.17 MIL/uL — AB (ref 3.87–5.11)
RDW: 15.9 % — ABNORMAL HIGH (ref 11.5–15.5)
WBC: 9.3 10*3/uL (ref 4.0–10.5)

## 2016-09-06 LAB — BASIC METABOLIC PANEL
Anion gap: 6 (ref 5–15)
BUN: <5 mg/dL — ABNORMAL LOW (ref 6–20)
CHLORIDE: 110 mmol/L (ref 101–111)
CO2: 26 mmol/L (ref 22–32)
Calcium: 8.5 mg/dL — ABNORMAL LOW (ref 8.9–10.3)
Creatinine, Ser: <0.3 mg/dL — ABNORMAL LOW (ref 0.44–1.00)
Glucose, Bld: 86 mg/dL (ref 65–99)
POTASSIUM: 3.9 mmol/L (ref 3.5–5.1)
SODIUM: 142 mmol/L (ref 135–145)

## 2016-09-06 MED ORDER — ACETAMINOPHEN 10 MG/ML IV SOLN
1000.0000 mg | Freq: Four times a day (QID) | INTRAVENOUS | Status: AC
  Administered 2016-09-06 – 2016-09-07 (×4): 1000 mg via INTRAVENOUS
  Filled 2016-09-06 (×4): qty 100

## 2016-09-06 MED ORDER — IOPAMIDOL (ISOVUE-300) INJECTION 61%
INTRAVENOUS | Status: AC
  Filled 2016-09-06: qty 30

## 2016-09-06 MED ORDER — KCL IN DEXTROSE-NACL 20-5-0.45 MEQ/L-%-% IV SOLN
INTRAVENOUS | Status: DC
  Administered 2016-09-06 – 2016-09-07 (×3): via INTRAVENOUS
  Filled 2016-09-06 (×5): qty 1000

## 2016-09-06 MED ORDER — LEVOFLOXACIN IN D5W 750 MG/150ML IV SOLN
750.0000 mg | INTRAVENOUS | Status: AC
  Administered 2016-09-06 – 2016-09-09 (×4): 750 mg via INTRAVENOUS
  Filled 2016-09-06 (×4): qty 150

## 2016-09-06 MED ORDER — LINEZOLID 600 MG PO TABS
600.0000 mg | ORAL_TABLET | Freq: Two times a day (BID) | ORAL | Status: DC
  Administered 2016-09-06: 600 mg via ORAL
  Filled 2016-09-06: qty 1

## 2016-09-06 MED ORDER — KETOROLAC TROMETHAMINE 15 MG/ML IJ SOLN
15.0000 mg | Freq: Four times a day (QID) | INTRAMUSCULAR | Status: DC | PRN
  Administered 2016-09-08: 15 mg via INTRAVENOUS
  Filled 2016-09-06: qty 1

## 2016-09-06 MED ORDER — IOPAMIDOL (ISOVUE-300) INJECTION 61%: 15.0000 mL | Freq: Two times a day (BID) | INTRAVENOUS | Status: DC | PRN

## 2016-09-06 MED ORDER — BISACODYL 10 MG RE SUPP
10.0000 mg | Freq: Every day | RECTAL | Status: DC | PRN
  Administered 2016-09-07: 10 mg via RECTAL
  Filled 2016-09-06: qty 1

## 2016-09-06 MED ORDER — KETOROLAC TROMETHAMINE 30 MG/ML IJ SOLN
30.0000 mg | Freq: Four times a day (QID) | INTRAMUSCULAR | Status: DC | PRN
  Administered 2016-09-06: 30 mg via INTRAVENOUS
  Filled 2016-09-06: qty 1

## 2016-09-06 MED ORDER — FUROSEMIDE 10 MG/ML IJ SOLN
20.0000 mg | Freq: Once | INTRAMUSCULAR | Status: AC
  Administered 2016-09-06: 20 mg via INTRAVENOUS
  Filled 2016-09-06: qty 2

## 2016-09-06 MED ORDER — POTASSIUM CHLORIDE 2 MEQ/ML IV SOLN: INTRAVENOUS | Status: DC

## 2016-09-06 MED ORDER — BOOST / RESOURCE BREEZE PO LIQD
1.0000 | Freq: Three times a day (TID) | ORAL | Status: DC
Start: 2016-09-06 — End: 2016-09-08
  Administered 2016-09-06 – 2016-09-07 (×4): 1 via ORAL

## 2016-09-06 NOTE — Plan of Care (Signed)
Problem: Health Behavior/Discharge Planning: Goal: Ability to manage health-related needs will improve Outcome: Progressing .  Problem: Physical Regulation: Goal: Ability to maintain clinical measurements within normal limits will improve Continue.  Goal: Will remain free from infection Outcome: Progressing .  Problem: Skin Integrity: Goal: Risk for impaired skin integrity will decrease Outcome: Progressing Will continue to encourage nutrition and repositioning. Will do wound care as ordered and continue to monitor skin integrity.   Problem: Activity: Goal: Risk for activity intolerance will decrease Outcome: Progressing Pt baseline is chair bound.    Problem: Fluid Volume: Goal: Ability to maintain a balanced intake and output will improve Outcome: Progressing .  Problem: Bowel/Gastric: Goal: Will not experience complications related to bowel motility Last BM 09/02/16. Pt with abdominal pain. CT of abdomen ordered.   Will continue to monitor.   Problem: Activity: Goal: Ability to return to normal activity level will improve Outcome: Progressing .  Problem: Health Behavior/Discharge Planning: Goal: Identification of resources available to assist in meeting health care needs will improve Outcome: Progressing .  Problem: Physical Regulation: Goal: Postoperative complications will be avoided or minimized Outcome: Progressing .

## 2016-09-06 NOTE — Progress Notes (Signed)
PROGRESS NOTE    Deanna Baldwin  JQZ:[id number] DOB: Nov 03, 1975 DOA: 08/30/2016 PCP: [name], DO   Brief Narrative:   41 year old female with past medical history of multiple sclerosis and neurogenic bladder presented to the ER with UTI/urosepsis. Patient was started on vancomycin and Zosyn and admitted to urology service. Medicine team was consulted due to hypotension and fever.  Assessment & Plan:   Principal Problem:   Hypotension Active Problems:   UTI (urinary tract infection)   Neurogenic bladder   Sepsis (HCC)   Fever, unspecified   Abnormal liver function   Pressure injury of skin    Abdominal distention, nausea -Concern for possible postop ileus versus bowel edema. Case discussed with Dr. Louis Meckel from urology, will order abdominal x-ray today but if this fails to improve we'll consider getting CT abdomen pelvis in a few days. Advance diet slowly. Antiemetics when necessary  Hypotension/fever; improved  Sepsis secondary to likely urinary tract infection; improved  -Cultures have been ordered and one of the cultures grew Staphylococcus hominis. Repeat cultures have been ordered -NGTD @ 5 days.  -Vancomycin and Zosyn has been stopped. Abx discontinued  -CTA of the chest done 7/31- was negative for pulmonary embolism, but showed atelectasis with possible early pneumonia and chronic bronchitis. -A.m. cortisol normal-15 -Echocardiogram done on 08/31/2016 shows normal ejection fraction. -Her current blood pressure of around systolic [phone] is at her baseline. Closely monitor urine output.   -POD #3 - ileal conduit urinary diversion. Adv diet as tolerated   Chronic hypertension -Patient is asymptomatic at this time. Urine output is adequate. If necessary we'll start her on Midodrine and low dose florenef  History of neurogenic bladder; STABLE  -This is likely secondary to her multiple sclerosis. Management per urology  Multiple Sclerosis- stable   Moderate to  severe protein calorie malnutrition -Encourage oral intake. Nutrition consult ordered. If fails to improve her oral intake she may need PICC line and TPN until her bowel function improves.   Bowel regimen prn.   Patient transferred to hospitalist service, accepted by Dr. Maudie Mercury.  Discussed with Dr Louis Meckel.   DVT prophylaxis: Heparin Code Status: Full code Family Communication:  None at bedside Disposition Plan: To be determined  Antimicrobials:   Vanc  8/1> 8/4  Zosyn 8/1 > 8/4  Cipro 8/4>8/5   Subjective: Patient reports of abdominal discomfort around surgical site and abdominal fullness. Due to this she is also having nausea.  Objective: Vitals:   09/05/16 0534 09/05/16 1405 09/05/16 2216 09/06/16 0619  BP: (!) 97/59 100/63 106/86 98/62  Pulse: 100 91 (!) 106 94  Resp: 18 18 18 18   Temp: 99.1 F (37.3 C) 98.4 F (36.9 C) 99 F (37.2 C) 98.9 F (37.2 C)  TempSrc: Oral Oral Oral Oral  SpO2: 98% 90% 98% 98%  Weight:      Height:        Intake/Output Summary (Last 24 hours) at 09/06/16 1227 Last data filed at 09/06/16 0700  Gross per 24 hour  Intake           1527.5 ml  Output             1400 ml  Net            127.5 ml   Filed Weights   08/30/16 1214  Weight: 38.5 kg (84 lb 14.4 oz)    Examination:  General exam: calm and comfortable this morning, frail thin cachectic appearing.  Respiratory system: Clear to auscultation. Respiratory effort normal.  Cardiovascular system: tachycardiac S1 & S2 heard, RRR. No JVD, murmurs, rubs, gallops or clicks. No pedal edema. Gastrointestinal system: Abdomen is tense and distended. Surgical scar noted soft and nontender. No organomegaly or masses felt. Normal bowel sounds heard. Ostomy bag in place Central nervous system: Alert and oriented. No focal neurological deficits. Extremities: Symmetric 5 x 5 power. Skin: No rashes, lesions or ulcers Psychiatry: Judgement and insight appear normal. Mood & affect appropriate.     Data Reviewed:   CBC:  Recent Labs Lab 08/31/16 0259 09/01/16 0454 09/03/16 0508 09/03/16 1335 09/05/16 1317 09/06/16 0522  WBC 9.7 6.9 4.9  --  10.7* 9.3  HGB 9.0* 8.3* 8.0* 10.6* 9.6* 8.1*  HCT 29.4* 26.9* 26.0* 35.2* 32.0* 27.1*  MCV 83.8 84.3 84.7  --  84.9 85.5  PLT 457* 502* 606*  --  528* 706*   Basic Metabolic Panel:  Recent Labs Lab 09/02/16 0434 09/03/16 0508 09/03/16 1335 09/04/16 0520 09/06/16 0522  NA 142 141 145 142 142  K 3.6 4.0 4.0 3.9 3.9  CL 109 106 106 106 110  CO2 26 26 25 27 26   GLUCOSE 95 83 155* 93 86  BUN 5* 6 <5* 5* <5*  CREATININE <0.30* <0.30* 0.45 <0.30* <0.30*  CALCIUM 8.9 9.1 9.8 8.7* 8.5*  MG 1.9 1.9  --  1.8  --    GFR: CrCl cannot be calculated (This lab value cannot be used to calculate CrCl because it is not a number: <0.30). Liver Function Tests:  Recent Labs Lab 08/31/16 0112 09/03/16 0508  AST 60* 20  ALT 62* 32  ALKPHOS 134* 113  BILITOT 0.4 0.3  PROT 5.7* 6.3*  ALBUMIN 2.1* 3.0*   No results for input(s): LIPASE, AMYLASE in the last 168 hours. No results for input(s): AMMONIA in the last 168 hours. Coagulation Profile: No results for input(s): INR, PROTIME in the last 168 hours. Cardiac Enzymes:  Recent Labs Lab 08/31/16 0259 08/31/16 1155 08/31/16 1449  TROPONINI <0.03 <0.03 <0.03   BNP (last 3 results) No results for input(s): PROBNP in the last 8760 hours. HbA1C: No results for input(s): HGBA1C in the last 72 hours. CBG: No results for input(s): GLUCAP in the last 168 hours. Lipid Profile: No results for input(s): CHOL, HDL, LDLCALC, TRIG, CHOLHDL, LDLDIRECT in the last 72 hours. Thyroid Function Tests: No results for input(s): TSH, T4TOTAL, FREET4, T3FREE, THYROIDAB in the last 72 hours. Anemia Panel: No results for input(s): VITAMINB12, FOLATE, FERRITIN, TIBC, IRON, RETICCTPCT in the last 72 hours. Sepsis Labs:  Recent Labs Lab 08/31/16 0112 09/01/16 0454  LATICACIDVEN 2.4* 0.9     Recent Results (from the past 240 hour(s))  Surgical pcr screen     Status: Abnormal   Collection Time: 08/30/16  1:03 PM  Result Value Ref Range Status   MRSA, PCR POSITIVE (A) NEGATIVE Corrected    Comment: RESULT CALLED TO, READ BACK BY AND VERIFIED WITH: ORA,I. RN ON 07.30.18 AT 1615 BY INGLEE CORRECTED ON 07/30 AT 1614: PREVIOUSLY REPORTED AS POSITIVE RESULT CALLED TO, READ BACK BY AND VERIFIED WITH: ORA,I RN ON 07.30.18 AT 1415 BY INGLEE    Staphylococcus aureus POSITIVE (A) NEGATIVE Final    Comment:        The Xpert SA Assay (FDA approved for NASAL specimens in patients over 1 years of age), is one component of a comprehensive surveillance program.  Test performance has been validated by [facility] for patients greater than or equal to 62 year old.  It is not intended to diagnose infection nor to guide or monitor treatment.   Culture, blood (routine x 2)     Status: Abnormal   Collection Time: 08/30/16  7:16 PM  Result Value Ref Range Status   Specimen Description BLOOD BLOOD RIGHT HAND  Final   Special Requests IN PEDIATRIC BOTTLE Blood Culture adequate volume  Final   Culture  Setup Time   Final    GRAM POSITIVE COCCI IN CLUSTERS IN PEDIATRIC BOTTLE CRITICAL RESULT CALLED TO, READ BACK BY AND VERIFIED WITH: A [facility] PHARMD 1928 08/31/16 A BROWNING Performed at [facility], [place] [address], [place], [place] [address]    Culture STAPHYLOCOCCUS HOMINIS (A)  Final   Report Status 09/02/2016 FINAL  Final   Organism ID, Bacteria STAPHYLOCOCCUS HOMINIS  Final      Susceptibility   Staphylococcus hominis - MIC*    CIPROFLOXACIN <=0.5 SENSITIVE Sensitive     ERYTHROMYCIN <=0.25 SENSITIVE Sensitive     GENTAMICIN <=0.5 SENSITIVE Sensitive     OXACILLIN 0.5 RESISTANT Resistant     TETRACYCLINE >=16 RESISTANT Resistant     VANCOMYCIN 1 SENSITIVE Sensitive     TRIMETH/SULFA 80 RESISTANT Resistant     CLINDAMYCIN <=0.25 SENSITIVE Sensitive     RIFAMPIN <=0.5  SENSITIVE Sensitive     Inducible Clindamycin NEGATIVE Sensitive     * STAPHYLOCOCCUS HOMINIS  Culture, blood (routine x 2)     Status: Abnormal   Collection Time: 08/30/16  7:16 PM  Result Value Ref Range Status   Specimen Description BLOOD BLOOD RIGHT ARM  Final   Special Requests IN PEDIATRIC BOTTLE Blood Culture adequate volume  Final   Culture  Setup Time   Final    GRAM POSITIVE COCCI IN CLUSTERS IN PEDIATRIC BOTTLE CRITICAL RESULT CALLED TO, READ BACK BY AND VERIFIED WITH: A [facility] PHARMD 1928 08/31/16 A BROWNING Performed at [facility], [place] [address] [place], [place] [address]    Culture STAPHYLOCOCCUS HOMINIS (A)  Final   Report Status 09/02/2016 FINAL  Final  Blood Culture ID Panel (Reflexed)     Status: Abnormal   Collection Time: 08/30/16  7:16 PM  Result Value Ref Range Status   Enterococcus species NOT DETECTED NOT DETECTED Final   Listeria monocytogenes NOT DETECTED NOT DETECTED Final   Staphylococcus species DETECTED (A) NOT DETECTED Final    Comment: Methicillin (oxacillin) resistant coagulase negative staphylococcus. Possible blood culture contaminant (unless isolated from more than one blood culture draw or clinical case suggests pathogenicity). No antibiotic treatment is indicated for blood  culture contaminants. CRITICAL RESULT CALLED TO, READ BACK BY AND VERIFIED WITH: A [facility] PHARMD 1928 08/31/16 A BROWNING    Staphylococcus aureus NOT DETECTED NOT DETECTED Final   Methicillin resistance DETECTED (A) NOT DETECTED Final    Comment: CRITICAL RESULT CALLED TO, READ BACK BY AND VERIFIED WITH: A [facility] PHARMD 1928 08/31/16 A BROWNING    Streptococcus species NOT DETECTED NOT DETECTED Final   Streptococcus agalactiae NOT DETECTED NOT DETECTED Final   Streptococcus pneumoniae NOT DETECTED NOT DETECTED Final   Streptococcus pyogenes NOT DETECTED NOT DETECTED Final   Acinetobacter baumannii NOT DETECTED NOT DETECTED Final   Enterobacteriaceae species NOT DETECTED  NOT DETECTED Final   Enterobacter cloacae complex NOT DETECTED NOT DETECTED Final   Escherichia coli NOT DETECTED NOT DETECTED Final   Klebsiella oxytoca NOT DETECTED NOT DETECTED Final   Klebsiella pneumoniae NOT DETECTED NOT DETECTED Final   Proteus species NOT  DETECTED NOT DETECTED Final   Serratia marcescens NOT DETECTED NOT DETECTED Final   Haemophilus influenzae NOT DETECTED NOT DETECTED Final   Neisseria meningitidis NOT DETECTED NOT DETECTED Final   Pseudomonas aeruginosa NOT DETECTED NOT DETECTED Final   Candida albicans NOT DETECTED NOT DETECTED Final   Candida glabrata NOT DETECTED NOT DETECTED Final   Candida krusei NOT DETECTED NOT DETECTED Final   Candida parapsilosis NOT DETECTED NOT DETECTED Final   Candida tropicalis NOT DETECTED NOT DETECTED Final    Comment: Performed at [facility], [place] [address], [place], [place] [address]  Urine culture     Status: Abnormal   Collection Time: 08/31/16  3:26 AM  Result Value Ref Range Status   Specimen Description URINE, RANDOM  Final   Special Requests NONE  Final   Culture (A)  Final    <10,000 COLONIES/mL INSIGNIFICANT GROWTH Performed at [place] [address] [place], [place] [address]    Report Status 09/01/2016 FINAL  Final  Culture, blood (routine x 2)     Status: None   Collection Time: 09/01/16 10:25 AM  Result Value Ref Range Status   Specimen Description BLOOD LEFT ARM  Final   Special Requests IN PEDIATRIC BOTTLE Blood Culture adequate volume  Final   Culture   Final    NO GROWTH 5 DAYS Performed at [facility], [place] [address] [place], [place] [address]    Report Status 09/06/2016 FINAL  Final  Culture, blood (routine x 2)     Status: None   Collection Time: 09/01/16 10:25 AM  Result Value Ref Range Status   Specimen Description BLOOD LEFT ARM  Final   Special Requests IN PEDIATRIC BOTTLE Blood Culture adequate volume  Final   Culture   Final    NO GROWTH 5 DAYS Performed at  [facility], [place] [address], [place], [place] [address]    Report Status 09/06/2016 FINAL  Final         Radiology Studies: Dg Abd 2 Views  Result Date: 09/05/2016 CLINICAL DATA:  Post op ureteral conduit surgery x 2 days ago, pt c/o intermittent pain, firm abd on palpation per chart. 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 Unable to raise LEFT arm for imaging in decubitus view. EXAM: ABDOMEN - 2 VIEW COMPARISON:  07/10/2016 bowel gas pattern is nonobstructive. Patient has bilateral ureterostomy tubes to ileal conduit in the right lower quadrant. Surgical clips overlie the abdomen. No free intraperitoneal air on the decubitus view of the abdomen. Chronic changes in both hips. No evidence for free intraperitoneal air. Small hiatal hernia noted. FINDINGS: The bowel gas pattern is normal. There is no evidence of free air. No radio-opaque calculi or other significant radiographic abnormality is seen. IMPRESSION: 1. Postoperative changes. 2. Ureteral stents. 3. Hiatal hernia. 4. Nonobstructive bowel gas pattern. Electronically Signed   By: Nolon Nations M.D.   On: 09/05/2016 13:36        Scheduled Meds: . alvimopan  12 mg Oral BID  . chlorhexidine  15 mL Mouth Rinse BID  . heparin subcutaneous  5,000 Units Subcutaneous Q8H  . iopamidol      . linezolid  600 mg Oral Q12H  . mouth rinse  15 mL Mouth Rinse q12n4p   Continuous Infusions: . acetaminophen Stopped (09/06/16 0916)  . dextrose 5 % and 0.45 % NaCl with KCl 20 mEq/L 100 mL/hr at 09/06/16 0951  . levofloxacin (LEVAQUIN)  IV       LOS: 7 days    Time spent: 30 mins     Zedekiah Hinderman Arsenio Loader, MD [facility] Pager [phone]   If 7PM-7AM, please contact night-coverage [email or web address] Password [facility] 09/06/2016, 12:27 PM

## 2016-09-06 NOTE — Progress Notes (Signed)
Patient ID: Deanna Baldwin, female   DOB: [birth year], 41 y.o.   MRN: [id number]  3 Days Post-Op Subjective: Feels pain in her stomach today Emesis yesterday No nausea currently Very little flatus  Objective: Vital signs in last 24 hours: Temp:  [98.4 F (36.9 C)-99 F (37.2 C)] 98.9 F (37.2 C) (08/06 0619) Pulse Rate:  [91-106] 94 (08/06 0619) Resp:  [18] 18 (08/06 0619) BP: (98-106)/(62-86) 98/62 (08/06 0619) SpO2:  [90 %-98 %] 98 % (08/06 0619)  Intake/Output from previous day: 08/05 0701 - 08/06 0700 In: 2022.5 [P.O.:120; I.V.:1902.5] Out: 1400 [Urine:1075; Drains:325] Intake/Output this shift: No intake/output data recorded.  Physical Exam:  General: Alert and oriented CV: RRR Lungs: Clear Abdomen: Very tight and hard.  Some bowel sounds. Urostomy pink and urine is clear. Incisions: C/D/I - dressing removed. Ext: NT, No erythema  Lab Results:  Recent Labs  09/03/16 1335 09/05/16 1317 09/06/16 0522  HGB 10.6* 9.6* 8.1*  HCT 35.2* 32.0* 27.1*   BMET  Recent Labs  09/04/16 0520 09/06/16 0522  NA 142 142  K 3.9 3.9  CL 106 110  CO2 27 26  GLUCOSE 93 86  BUN 5* <5*  CREATININE <0.30* <0.30*  CALCIUM 8.7* 8.5*     Studies/Results: Dg Abd 2 Views  Result Date: 09/05/2016 CLINICAL DATA:  Post op ureteral conduit surgery x 2 days ago, pt c/o intermittent pain, firm abd on palpation per chart. 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 Unable to raise LEFT arm for imaging in decubitus view. EXAM: ABDOMEN - 2 VIEW COMPARISON:  07/10/2016 bowel gas pattern is nonobstructive. Patient has bilateral ureterostomy tubes to ileal conduit in the right lower quadrant. Surgical clips overlie the abdomen. No free intraperitoneal air on the decubitus view of the abdomen. Chronic changes in both hips. No evidence for free intraperitoneal air. Small hiatal hernia noted. FINDINGS: The bowel gas pattern is normal. There is no evidence of free air. No  radio-opaque calculi or other significant radiographic abnormality is seen. IMPRESSION: 1. Postoperative changes. 2. Ureteral stents. 3. Hiatal hernia. 4. Nonobstructive bowel gas pattern. Electronically Signed   By: Nolon Nations M.D.   On: 09/05/2016 13:36    Assessment/Plan: POD # 3 s/p ileal conduit urinary diversion  1) S/P ileal conduit: Sips today, no diet advance.  Switch to MIVF without bolus to minimize bowel edema.  Suspect she has post-operative ileus with little room in her peritoneum for swelling given size.  Once she diueresis this should improve.  I have reordered toradol and IV tylenol for pain. 2) Bacteremia: Antibiotics stopped per IM. [facility] care.  ? 10 day course for bacteremia/PNA 3) Wound care:  Appreciate WOC nursing. 4) Anemia: Has chronic anemia (baseline Hgb 8 before surgery).  repeat hgb tomorrow. 5) Contineu PT/OT   Patient being transferred to Hospitalist service at this point, we will continue to follow.    LOS: 7 days   Ardis Hughs 09/06/2016, 8:24 AM

## 2016-09-06 NOTE — Progress Notes (Deleted)
The patient is feeling significantly better this afternoon from this morning. Her pain is much better controlled. She is burping, and his have had much gas. She has tolerated sips of clears. Her KUB today demonstrates postoperative ileus.  I have written for the patient to get a Dulcolax suppository tonight. I'm hopeful that she will open up over the next 12 hours and can begin eating again. If not, I would have a low threshold for initiating TPN given her poor nutritional status preoperative.

## 2016-09-06 NOTE — Progress Notes (Signed)
CSW following and will assist with discharge back to [facility] when patient is medically stable for discharge.   Abundio Miu, [place] Social Worker [facility] Cell#: [phone]

## 2016-09-06 NOTE — Care Management Note (Signed)
Case Management Note  Patient Details  Name: Deanna Baldwin MRN: [id number] Date of Birth: Mar 28, 1975  Subjective/Objective:41 y/o f admitted w/Bacteremia,ileal conduit. Hx: MS. woc following-urostomy.From SNF-[facility]-CSW already following..                    Action/Plan:d/c SNF.   Expected Discharge Date:   (unknown)               Expected Discharge Plan:  Skilled Nursing Facility  In-House Referral:  Clinical Social Work  Discharge planning Services  CM Consult  Post Acute Care Choice:    Choice offered to:     DME Arranged:    DME Agency:     HH Arranged:    [place] Agency:     Status of Service:  In process, will continue to follow  If discussed at Long Length of Stay Meetings, dates discussed:    Additional Comments:  Dessa Phi, RN 09/06/2016, 1:58 PM

## 2016-09-06 NOTE — Progress Notes (Signed)
The patient is feeling significantly better this afternoon from this morning. Her pain is much better controlled. She is burping, and his have had much gas. She has tolerated sips of clears. Her KUB today demonstrates postoperative ileus.  I have written for the patient to get a Dulcolax suppository tonight. I'm hopeful that she will open up over the next 12 hours and can begin eating again. If not, I would have a low threshold for initiating TPN given her poor nutritional status preoperative. In addition, I have reduced her fluids to 50cc/hr and ordered 20mg  IV lasix to try and reduce some her post-operative bowel edema.

## 2016-09-06 NOTE — Care Management Important Message (Signed)
Important Message  Patient Details IM Letter given to Kathy/Case Manager to present to Patient Name: Deanna Baldwin MRN: [id number] Date of Birth: 15-Jan-1976   Medicare Important Message Given:  Yes    [name] 09/06/2016, 12:09 PM

## 2016-09-06 NOTE — Progress Notes (Signed)
Nutrition Follow-up  DOCUMENTATION CODES:   Underweight, Severe malnutrition in context of chronic illness  INTERVENTION:   Boost Breeze po TID, each supplement provides 250 kcal and 9 grams of protein  Will continue to monitor for nutrition support if warranted.   NUTRITION DIAGNOSIS:   Malnutrition (Severe) related to chronic illness (multiple sclerosis) as evidenced by severe depletion of body fat, severe depletion of muscle mass.  Ongoing  GOAL:   Patient will meet greater than or equal to 90% of their needs  Declining due to abdominal pain  MONITOR:   PO intake, Supplement acceptance, Labs, Weight trends  REASON FOR ASSESSMENT:   Rounds   ASSESSMENT:   Pt with PMH of progressive spastic MS, neurogenic bladder, and is chronically bed bound. Recently admitted 6/29 for foley catheter issues and UTI. Presents this admission with UTI, hypotension and multiple pressure injuries.   8/6- xray shows mild distention of small and large bowl, most compatible with mild adynamic ileus in the postoperative setting.   Consulted for TPN recommendations. Spoke with MD who states we are going to wait for 1-2 more days to see if PO intake increases. Will provide recommendations if warranted.   Spoke with pt who reports appetite has decreased since yesterday due to severe abdominal pain. Will try to tolerate clear liquids today. Will provide Boost Breeze supplementation while on clear diet due to severely malnourished state of pt and increased protein needs.   Medications reviewed and include: NS with D% KCl @ 100 ml/hr Labs reviewed: Creatinine <0.30 (L) Ca 8.5 (L) CBG 86-158  Diet Order:  Diet clear liquid Room service appropriate? Yes; Fluid consistency: Thin; Fluid restriction: Other (see comments)  Skin:   (stage 3- sacrum/ischial tuberosity, multiple foot wounds)  Last BM:  09/02/16  Height:   Ht Readings from Last 1 Encounters:  08/30/16 5\' 9"  (1.753 m)    Weight:   Wt  Readings from Last 1 Encounters:  08/30/16 84 lb 14.4 oz (38.5 kg)    Ideal Body Weight:  59.3 kg (adjusted for paraplegia)  BMI:  Body mass index is 12.54 kg/m.  Estimated Nutritional Needs:   Kcal:  1500-1700 (38-44 kcal/kg)  Protein:  75-85 grams (2-2.2 g/kg)  Fluid:  >1.5 L/day   EDUCATION NEEDS:   No education needs identified at this time   [place], LDN Clinical Nutrition Pager # - [phone]

## 2016-09-06 NOTE — Consult Note (Signed)
[place] Nurse ostomy consult note Stoma type/location: RLQ Ileal conduit placed for treatment of neurogenic bladder caused by MS.   Stomal assessment/size: 1 3/8" round pink stoma Peristomal assessment: intact  Red and blue stents in place and draining clear yellow urine Treatment options for stomal/peristomal skin: barrier ring to improve wear time.  Output clear yellow urine Ostomy pouching: 1pc. Flat urostomy pouch with barrier ring Education provided: Patient observes pouch change today.  Discussed purpose of bedside drainage bag and barrier ring.  Discussed red and blue stents and their function.  Patient is very engaged and eager to learn.  Demonstrated opening and closing pouch and she is able to do this with some noted struggle. She is hopeful that the ileal conduit will improve her quality of life.   Enrolled patient in [facility] program: No Will not follow at this time.  Please re-consult if needed.  Domenic Moras RN BSN [place] Pager [phone]

## 2016-09-06 NOTE — Progress Notes (Signed)
Patient vomited around 200cc of Deanna Baldwin liquid after entereg was given last night.  PRN Zofran was given IV. Patient's bag is also leaking on inner edge near mid line incision. Unable to obtain bag from supplies overnight so patient positioned to promote drainage of urine. Will continue to monitor patient.

## 2016-09-07 DIAGNOSIS — D649 Anemia, unspecified: Secondary | ICD-10-CM

## 2016-09-07 DIAGNOSIS — K567 Ileus, unspecified: Secondary | ICD-10-CM

## 2016-09-07 LAB — CBC
HEMATOCRIT: 28.7 % — AB (ref 36.0–46.0)
Hemoglobin: 8.6 g/dL — ABNORMAL LOW (ref 12.0–15.0)
MCH: 25.6 pg — ABNORMAL LOW (ref 26.0–34.0)
MCHC: 30 g/dL (ref 30.0–36.0)
MCV: 85.4 fL (ref 78.0–100.0)
PLATELETS: 592 10*3/uL — AB (ref 150–400)
RBC: 3.36 MIL/uL — AB (ref 3.87–5.11)
RDW: 16.2 % — ABNORMAL HIGH (ref 11.5–15.5)
WBC: 5.6 10*3/uL (ref 4.0–10.5)

## 2016-09-07 LAB — COMPREHENSIVE METABOLIC PANEL
ALBUMIN: 2.6 g/dL — AB (ref 3.5–5.0)
ALT: 18 U/L (ref 14–54)
AST: 16 U/L (ref 15–41)
Alkaline Phosphatase: 123 U/L (ref 38–126)
Anion gap: 9 (ref 5–15)
CHLORIDE: 105 mmol/L (ref 101–111)
CO2: 28 mmol/L (ref 22–32)
Calcium: 8.7 mg/dL — ABNORMAL LOW (ref 8.9–10.3)
GLUCOSE: 93 mg/dL (ref 65–99)
POTASSIUM: 3.5 mmol/L (ref 3.5–5.1)
SODIUM: 142 mmol/L (ref 135–145)
Total Bilirubin: 0.4 mg/dL (ref 0.3–1.2)
Total Protein: 5.9 g/dL — ABNORMAL LOW (ref 6.5–8.1)

## 2016-09-07 LAB — DIFFERENTIAL
BASOS ABS: 0 10*3/uL (ref 0.0–0.1)
BASOS PCT: 0 %
Eosinophils Absolute: 0.3 10*3/uL (ref 0.0–0.7)
Eosinophils Relative: 6 %
LYMPHS PCT: 18 %
Lymphs Abs: 1 10*3/uL (ref 0.7–4.0)
MONO ABS: 0.4 10*3/uL (ref 0.1–1.0)
MONOS PCT: 6 %
NEUTROS ABS: 3.9 10*3/uL (ref 1.7–7.7)
Neutrophils Relative %: 70 %

## 2016-09-07 LAB — MAGNESIUM: Magnesium: 1.8 mg/dL (ref 1.7–2.4)

## 2016-09-07 LAB — TRIGLYCERIDES: TRIGLYCERIDES: 138 mg/dL (ref <150)

## 2016-09-07 LAB — PHOSPHORUS: Phosphorus: 4.3 mg/dL (ref 2.5–4.6)

## 2016-09-07 LAB — PREALBUMIN: Prealbumin: 9.3 mg/dL — ABNORMAL LOW (ref 18–38)

## 2016-09-07 MED ORDER — FLUDROCORTISONE ACETATE 0.1 MG PO TABS
0.1000 mg | ORAL_TABLET | Freq: Every day | ORAL | Status: DC
  Administered 2016-09-07 – 2016-09-10 (×4): 0.1 mg via ORAL
  Filled 2016-09-07 (×4): qty 1

## 2016-09-07 MED ORDER — FUROSEMIDE 10 MG/ML IJ SOLN
20.0000 mg | Freq: Once | INTRAMUSCULAR | Status: AC
  Administered 2016-09-07: 20 mg via INTRAVENOUS
  Filled 2016-09-07: qty 2

## 2016-09-07 MED ORDER — POTASSIUM CHLORIDE CRYS ER 20 MEQ PO TBCR
40.0000 meq | EXTENDED_RELEASE_TABLET | Freq: Once | ORAL | Status: AC
  Administered 2016-09-07: 40 meq via ORAL
  Filled 2016-09-07: qty 2

## 2016-09-07 MED ORDER — MIDODRINE HCL 2.5 MG PO TABS
2.5000 mg | ORAL_TABLET | Freq: Three times a day (TID) | ORAL | Status: DC
  Administered 2016-09-07 – 2016-09-10 (×10): 2.5 mg via ORAL
  Filled 2016-09-07 (×12): qty 1

## 2016-09-07 MED ORDER — METOCLOPRAMIDE HCL 5 MG/ML IJ SOLN: 5.0000 mg | Freq: Three times a day (TID) | INTRAMUSCULAR | Status: DC | PRN

## 2016-09-07 NOTE — Plan of Care (Signed)
Problem: Health Behavior/Discharge Planning: Goal: Ability to manage health-related needs will improve Outcome: Progressing .  Problem: Physical Regulation: Goal: Ability to maintain clinical measurements within normal limits will improve Outcome: Progressing . Goal: Will remain free from infection Outcome: Progressing .  Problem: Skin Integrity: Goal: Risk for impaired skin integrity will decrease Outcome: Progressing Wound care BID.   Encouraging nutrition.  Pt drinking Boost supplement. Will continue.    Problem: Activity: Goal: Risk for activity intolerance will decrease Outcome: Progressing .  Problem: Fluid Volume: Goal: Ability to maintain a balanced intake and output will improve Outcome: Progressing .  Problem: Nutrition: Goal: Adequate nutrition will be maintained Outcome: Progressing .  Problem: Bowel/Gastric: Goal: Will not experience complications related to bowel motility Outcome: Progressing Now passing flatus.   Problem: Activity: Goal: Ability to return to normal activity level will improve Outcome: Progressing Will get pt up to chair today. chairfast at baseline.    Problem: Bowel/Gastric: Goal: Gastrointestinal status for postoperative course will improve Outcome: Progressing Pt now passing flatus.    Problem: Education: Goal: Understanding of discharge needs will improve PT eval pending.    Problem: Physical Regulation: Goal: Postoperative complications will be avoided or minimized Outcome: Progressing .  Problem: Pain Management: Goal: General experience of comfort will improve Outcome: Progressing No c/o pain so far this shift.   Will continue to monitor and treat with prn if needed.   Problem: Skin Integrity: Goal: Demonstration of wound healing without infection will improve Outcome: Progressing Wound care BID.   Encouraging nutrition.  Pt drinking Boost supplement. Will continue.

## 2016-09-07 NOTE — Progress Notes (Signed)
Patient ID: Deanna Baldwin, female   DOB: [birth year], 41 y.o.   MRN: [id number]  4 Days Post-Op Subjective: Over the past 24 hours, the patient's pain is become much better controlled. She denies any nausea or vomiting. She has not had any flatus.  Interval: The patient's IV fluids were switched to maintenance, and reduced to 50 mL/hr. She was also given 20 mg of IV Lasix as well as a suppository. She was back down to sips. KUB yesterday morning demonstrated postoperative ileus.  No acute events overnight.  Objective: Vital signs in last 24 hours: Temp:  [98 F (36.7 C)-98.2 F (36.8 C)] 98.2 F (36.8 C) (08/07 0528) Pulse Rate:  [89-100] 99 (08/07 0528) Resp:  [17-18] 17 (08/07 0528) BP: (88-100)/(59-72) 88/72 (08/07 0528) SpO2:  [95 %-100 %] 95 % (08/07 0528)  Intake/Output from previous day: 08/06 0701 - 08/07 0700 In: 1907.5 [P.O.:120; I.V.:1337.5; IV Piggyback:450] Out: 2435 [Urine:2250; Drains:185] Intake/Output this shift: No intake/output data recorded.  Physical Exam:  General: Alert and oriented CV: RRR Lungs: Clear Abdomen: Firm with bowel sounds in all 4 quadrants. Urostomy pink and urine is clear. Incisions: C/D/I - dressing removed. Ext: NT, No erythema  Lab Results:  Recent Labs  09/05/16 1317 09/06/16 0522 09/07/16 0458  HGB 9.6* 8.1* 8.6*  HCT 32.0* 27.1* 28.7*   BMET  Recent Labs  09/06/16 0522 09/07/16 0458  NA 142 142  K 3.9 3.5  CL 110 105  CO2 26 28  GLUCOSE 86 93  BUN <5* <5*  CREATININE <0.30* <0.30*  CALCIUM 8.5* 8.7*     Studies/Results: Dg Abd 1 View  Result Date: 09/06/2016 CLINICAL DATA:  Abdominal distension EXAM: ABDOMEN - 1 VIEW COMPARISON:  09/05/2016 abdominal radiographs FINDINGS: Bilateral nephroureteral stents are stable in position. Additional surgical drain overlying the pelvis is stable in position. Skin staples overlie the left abdomen. Surgical clips overlie the bilateral deep pelvis. Mild diffuse gaseous  distention of the small and large bowel, mildly increased. No evidence of pneumatosis or pneumoperitoneum. No radiopaque urolithiasis. IMPRESSION: Mild diffuse gaseous distention of the small and large bowel, mildly increased, most compatible with mild adynamic ileus in the postoperative setting. Electronically Signed   By: Ilona Sorrel M.D.   On: 09/06/2016 12:52   Dg Abd 2 Views  Result Date: 09/05/2016 CLINICAL DATA:  Post op ureteral conduit surgery x 2 days ago, pt c/o intermittent pain, firm abd on palpation per chart. 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 Unable to raise LEFT arm for imaging in decubitus view. EXAM: ABDOMEN - 2 VIEW COMPARISON:  07/10/2016 bowel gas pattern is nonobstructive. Patient has bilateral ureterostomy tubes to ileal conduit in the right lower quadrant. Surgical clips overlie the abdomen. No free intraperitoneal air on the decubitus view of the abdomen. Chronic changes in both hips. No evidence for free intraperitoneal air. Small hiatal hernia noted. FINDINGS: The bowel gas pattern is normal. There is no evidence of free air. No radio-opaque calculi or other significant radiographic abnormality is seen. IMPRESSION: 1. Postoperative changes. 2. Ureteral stents. 3. Hiatal hernia. 4. Nonobstructive bowel gas pattern. Electronically Signed   By: Nolon Nations M.D.   On: 09/05/2016 13:36    Assessment/Plan: POD # 3 s/p ileal conduit urinary diversion - She still has a postoperative ileus, but I suspect that she will open up over the next day or so.  She is chronically malnourished, and would benefit from sustenance if she cannot take by mouth beyond  today - I would suggest considering TPN.  1) S/P ileal conduit:Clear liquids today. and repeat Lasix and suppository. Recommend continued IV Tylenol and Toradol for her pain to minimize narcotics. 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 2) Bacteremia: Antibiotics  stopped per IM. [facility] care.  ? 10 day course for bacteremia/PNA 3) Wound care:  Appreciate WOC nursing. 4) Anemia: Has chronic anemia (baseline Hgb 8 before surgery).  repeat hgb tomorrow.    The patient will be scheduled for follow-up with me in 2 weeks for staple removal and stent removal. We will organize this and contact the patient and her facility for logistics.   LOS: 8 days   Ardis Hughs 09/07/2016, 7:46 AM

## 2016-09-07 NOTE — Consult Note (Signed)
   [facility] CM Inpatient Consult   09/07/2016  Deanna Baldwin 09-29-1975 [id number]    Patient screened for potential [facility] Care Management services for multiple hospitalizations.   Confirmed with inpatient LCSW that [name] is a long term resident of [facility].   Therefore, there are no identifiable [facility] Care Management needs.   Marthenia Rolling, MSN-Ed, RN,BSN [facility] Liaison [phone]

## 2016-09-07 NOTE — Progress Notes (Addendum)
Hospital course:  The patient was admitted on July 30, Monday, for preoperative planning, bowel prep, and stoma site marking for her planned procedure on Wednesday, August 1. However, Monday evening she developed hypotension and was febrile to 103. She was started on broad-spectrum antibiotics, Zosyn/lincomycin and broadly cultured. Blood cultures returned staph hominis.  She also had a CT scan of her chest which demonstrated infiltrate in the left lower lobe which was read as probable early pneumonia.transthoracic echocardiogram demonstrated no wall motion abnormalities and a normal EF.   Repeat blood cultures 2 days later returned no growth. She was stabilized and subsequently taken to the operating room on August 3 for supravesical urinary diversion with ileal conduit.She was continued on Vanco and Zosyn until Sunday, received a total of 6 days of antibiotics.  The patient's postoperative course has been largely unremarkable, but slowed for postoperative ileus. The patient was on in Entereg to help minimize narcotic-induced ileus and  Speed up her return of bowel function.  Her abdominal exam on postoperative day #2 was concerning given the firmness, and a KUB ordered which was largely unremarkable. A repeat x-ray was done on postoperative day #3 with little change, and no significant concern.  Consideration of TPN was given and a low threshold to start it given her chronic malnutrition.  The patient was seen and followed for several pressure ulcers by the wound care team. She was also followed by the stoma nurse who has been working with the patient to educate her on her stoma and changing her ostomy appliance.  She was seen and evaluated by both PT and OT signed off stating that the patient was at her baseline and needed no additional therapy.  On postoperative day #3 the patient was transferred to the hospital service for ongoing care with urology following closely.

## 2016-09-07 NOTE — Discharge Instructions (Signed)
[facility] [facility] Stay Proper nutrition can help your body recover from illness and injury.   Foods and beverages high in protein, vitamins, and minerals help rebuild muscle loss, promote healing, & reduce fall risk.   In addition to eating healthy foods, a nutrition shake is an easy, delicious way to get the nutrition you need during and after your hospital stay  It is recommended that you continue to drink 2 bottles per day of: Ensure Enlive for at least 1 month (30 days) after your hospital stay   Tips for adding a nutrition shake into your routine: As allowed, drink one with vitamins or medications instead of water or juice Enjoy one as a tasty mid-morning or afternoon snack Drink cold or make a milkshake out of it Drink one instead of milk with cereal or snacks Use as a coffee creamer   Available at the following grocery stores and pharmacies:           * [place] [phone]            For COUPONS visit: www.ensure.com/join or http://dawson-may.com/   Suggested Substitutions Ensure Plus = Boost Plus = Carnation Breakfast Essentials = Boost Compact Ensure Active Clear = Boost Breeze Glucerna Shake = Boost Glucose Control = Carnation Breakfast Essentials SUGAR FREE      Diet: You should advance your diet as instructed by your physician.  It will be normal to have some bloating, nausea, and abdominal discomfort intermittently.   Prescriptions:  You will be provided a prescription for pain medication to take as needed.  If your pain is not severe enough to require the prescription pain medication, you may take extra strength Tylenol instead which will have less side effects.  You should also take a prescribed stool softener to avoid straining with bowel movements as the prescription pain medication may  constipate you.   Incisions: You may remove your dressing bandages 48 hours after surgery if not removed in the hospital.  You will either have some small staples or special tissue glue at each of the incision sites. Once the bandages are removed (if present), the incisions may stay open to air.  You may start showering (but not soaking or bathing in water) the 2nd day after surgery and the incisions simply need to be patted dry after the shower.  No additional care is needed.   What to call us about: You should call the office [phone]) if you develop fever > 101 or develop persistent vomiting. Activity:  You are encouraged to ambulate frequently (about every hour during waking hours) to help prevent blood clots from forming in your legs or lungs.  However, you should not engage in any heavy lifting (> 10-15 lbs), strenuous activity, or straining.

## 2016-09-07 NOTE — Progress Notes (Signed)
PROGRESS NOTE    Deanna Baldwin  WLN:[id number] DOB: 1975-04-05 DOA: 08/30/2016 PCP: [name], DO   Brief Narrative:   41 year old female with past medical history of multiple sclerosis and neurogenic bladder presented to the ER with UTI/urosepsis. Patient was started on vancomycin and Zosyn and admitted to urology service. Medicine team was consulted due to hypotension and fever.  Assessment & Plan:   Principal Problem:   Hypotension Active Problems:   UTI (urinary tract infection)   Neurogenic bladder   Sepsis (HCC)   Fever, unspecified   Abnormal liver function   Pressure injury of skin  Adynamic ileus -Likely secondary to postop from recent abdominal surgery -Conservative management. Slowly advance diet-clear liquid today -Antibiotics as needed. Advised nursing staff and physical therapy to get patient out of bed to chair -Reglan when necessary to help with his promotility properties as well -If nausea vomiting worsens we'll repeat abdominal imaging and may end up needing NG tube. -Supportive care -Replete potassium and keep it at or little above 4.0, magnesium 1.8 -Entereg ordered by urology. -Asked nurse to request physical therapy to see the patient intermittently while she is here with some passive exercises and getting her out to the chair.  Hypotension/fever; improved  Sepsis secondary to likely urinary tract infection; improved  -Cultures have been ordered and one of the cultures grew Staphylococcus hominis. Repeat cultures have been ordered -NGTD @ 5 days. Origin of blood cultures likely contaminate as repeat cultures have failed to grow but this also could be due to antibiotics that were already given. -Vancomycin and Zosyn has been stopped but Levaquin restarted by urology. Will defer further antibiotic treatment per their team. -CTA of the chest done 7/31- was negative for pulmonary embolism, but showed atelectasis with possible early pneumonia and chronic  bronchitis. -A.m. cortisol normal-15 -Echocardiogram done on 08/31/2016 shows normal ejection fraction. -Her current blood pressure of around systolic [phone] is at her baseline. Closely monitor urine output.   -POD #4 - ileal conduit urinary diversion. Adv diet as tolerated   Chronic hypertension -Patient is asymptomatic at this time. Urine output is adequate. Start low-dose Florinef and Midorine   History of neurogenic bladder; STABLE  -This is likely secondary to her multiple sclerosis. Management per urology  Multiple Sclerosis- stable   Anemia -Likely secondary to chronic disease and poor nutritional status -We'll order iron studies, ferritin, B12 and folate. Order TSH  Moderate to severe protein calorie malnutrition -Encourage oral intake- currently on clears and advance as tolerated. She has poor nutritional status and low body was urged therefore low threshold to place PICC line and start her on TPN. We'll still give her another day or 2 to see if her bowel function regained.  Bowel regimen prn.   DVT prophylaxis: Heparin Code Status: Full code Family Communication:  None at bedside Disposition Plan: To be determined  Antimicrobials:   Vanc  8/1> 8/4  Zosyn 8/1 > 8/4  Cipro 8/4>8/5   Subjective: Patient states she was able to pass flatulence once this morning and her nausea is little better. No other complaints. Requesting to drink some chicken broth along with a clear liquid diet this morning.  Objective: Vitals:   09/06/16 0619 09/06/16 1432 09/06/16 2149 09/07/16 0528  BP: 98/62 (!) 98/59 100/60 (!) 88/72  Pulse: 94 89 100 99  Resp: 18  18 17   Temp: 98.9 F (37.2 C) 98.2 F (36.8 C) 98 F (36.7 C) 98.2 F (36.8 C)  TempSrc: Oral Oral Oral  Oral  SpO2: 98% 99% 100% 95%  Weight:      Height:        Intake/Output Summary (Last 24 hours) at 09/07/16 1131 Last data filed at 09/07/16 0533  Gross per 24 hour  Intake           1907.5 ml  Output              2435 ml  Net           -527.5 ml   Filed Weights   08/30/16 1214  Weight: 38.5 kg (84 lb 14.4 oz)    Examination:  General exam: calm and comfortable this morning, frail thin cachectic appearing.  Respiratory system: Clear to auscultation. Respiratory effort normal. Cardiovascular system: tachycardiac S1 & S2 heard, RRR. No JVD, murmurs, rubs, gallops or clicks. No pedal edema. Gastrointestinal system: Abdomen is Tense and distended. Surgical scar noted soft and nontender. No organomegaly or masses felt. Normal bowel sounds heard. Ostomy bag in place Central nervous system: Alert and oriented. No focal neurological deficits. Extremities: Symmetric 5 x 5 power. Skin: No rashes, lesions or ulcers Psychiatry: Judgement and insight appear normal. Mood & affect appropriate.    Data Reviewed:   CBC:  Recent Labs Lab 09/01/16 0454 09/03/16 0508 09/03/16 1335 09/05/16 1317 09/06/16 0522 09/07/16 0458  WBC 6.9 4.9  --  10.7* 9.3 5.6  NEUTROABS  --   --   --   --   --  3.9  HGB 8.3* 8.0* 10.6* 9.6* 8.1* 8.6*  HCT 26.9* 26.0* 35.2* 32.0* 27.1* 28.7*  MCV 84.3 84.7  --  84.9 85.5 85.4  PLT 502* 606*  --  528* 572* 017*   Basic Metabolic Panel:  Recent Labs Lab 09/02/16 0434 09/03/16 0508 09/03/16 1335 09/04/16 0520 09/06/16 0522 09/07/16 0458  NA 142 141 145 142 142 142  K 3.6 4.0 4.0 3.9 3.9 3.5  CL 109 106 106 106 110 105  CO2 26 26 25 27 26 28   GLUCOSE 95 83 155* 93 86 93  BUN 5* 6 <5* 5* <5* <5*  CREATININE <0.30* <0.30* 0.45 <0.30* <0.30* <0.30*  CALCIUM 8.9 9.1 9.8 8.7* 8.5* 8.7*  MG 1.9 1.9  --  1.8  --  1.8  PHOS  --   --   --   --   --  4.3   GFR: CrCl cannot be calculated (This lab value cannot be used to calculate CrCl because it is not a number: <0.30). Liver Function Tests:  Recent Labs Lab 09/03/16 0508 09/07/16 0458  AST 20 16  ALT 32 18  ALKPHOS 113 123  BILITOT 0.3 0.4  PROT 6.3* 5.9*  ALBUMIN 3.0* 2.6*   No results for input(s):  LIPASE, AMYLASE in the last 168 hours. No results for input(s): AMMONIA in the last 168 hours. Coagulation Profile: No results for input(s): INR, PROTIME in the last 168 hours. Cardiac Enzymes:  Recent Labs Lab 08/31/16 1155 08/31/16 1449  TROPONINI <0.03 <0.03   BNP (last 3 results) No results for input(s): PROBNP in the last 8760 hours. HbA1C: No results for input(s): HGBA1C in the last 72 hours. CBG: No results for input(s): GLUCAP in the last 168 hours. Lipid Profile:  Recent Labs  09/07/16 0458  TRIG 138   Thyroid Function Tests: No results for input(s): TSH, T4TOTAL, FREET4, T3FREE, THYROIDAB in the last 72 hours. Anemia Panel: No results for input(s): VITAMINB12, FOLATE, FERRITIN, TIBC, IRON, RETICCTPCT in the last 72 hours.  Sepsis Labs:  Recent Labs Lab 09/01/16 0454  LATICACIDVEN 0.9    Recent Results (from the past 240 hour(s))  Surgical pcr screen     Status: Abnormal   Collection Time: 08/30/16  1:03 PM  Result Value Ref Range Status   MRSA, PCR POSITIVE (A) NEGATIVE Corrected    Comment: RESULT CALLED TO, READ BACK BY AND VERIFIED WITH: ORA,I. RN ON 07.30.18 AT 1615 BY INGLEE CORRECTED ON 07/30 AT 1614: PREVIOUSLY REPORTED AS POSITIVE RESULT CALLED TO, READ BACK BY AND VERIFIED WITH: ORA,I RN ON 07.30.18 AT 1415 BY INGLEE    Staphylococcus aureus POSITIVE (A) NEGATIVE Final    Comment:        The Xpert SA Assay (FDA approved for NASAL specimens in patients over 18 years of age), is one component of a comprehensive surveillance program.  Test performance has been validated by [facility] for patients greater than or equal to 69 year old. It is not intended to diagnose infection nor to guide or monitor treatment.   Culture, blood (routine x 2)     Status: Abnormal   Collection Time: 08/30/16  7:16 PM  Result Value Ref Range Status   Specimen Description BLOOD BLOOD RIGHT HAND  Final   Special Requests IN PEDIATRIC BOTTLE Blood Culture  adequate volume  Final   Culture  Setup Time   Final    GRAM POSITIVE COCCI IN CLUSTERS IN PEDIATRIC BOTTLE CRITICAL RESULT CALLED TO, READ BACK BY AND VERIFIED WITH: A [facility] PHARMD 1928 08/31/16 A BROWNING Performed at [facility], [place] [address], [place], [place] [address]    Culture STAPHYLOCOCCUS HOMINIS (A)  Final   Report Status 09/02/2016 FINAL  Final   Organism ID, Bacteria STAPHYLOCOCCUS HOMINIS  Final      Susceptibility   Staphylococcus hominis - MIC*    CIPROFLOXACIN <=0.5 SENSITIVE Sensitive     ERYTHROMYCIN <=0.25 SENSITIVE Sensitive     GENTAMICIN <=0.5 SENSITIVE Sensitive     OXACILLIN 0.5 RESISTANT Resistant     TETRACYCLINE >=16 RESISTANT Resistant     VANCOMYCIN 1 SENSITIVE Sensitive     TRIMETH/SULFA 80 RESISTANT Resistant     CLINDAMYCIN <=0.25 SENSITIVE Sensitive     RIFAMPIN <=0.5 SENSITIVE Sensitive     Inducible Clindamycin NEGATIVE Sensitive     * STAPHYLOCOCCUS HOMINIS  Culture, blood (routine x 2)     Status: Abnormal   Collection Time: 08/30/16  7:16 PM  Result Value Ref Range Status   Specimen Description BLOOD BLOOD RIGHT ARM  Final   Special Requests IN PEDIATRIC BOTTLE Blood Culture adequate volume  Final   Culture  Setup Time   Final    GRAM POSITIVE COCCI IN CLUSTERS IN PEDIATRIC BOTTLE CRITICAL RESULT CALLED TO, READ BACK BY AND VERIFIED WITH: A [facility] PHARMD 1928 08/31/16 A BROWNING Performed at [facility], [place] [address] [place], [place] [address]    Culture STAPHYLOCOCCUS HOMINIS (A)  Final   Report Status 09/02/2016 FINAL  Final  Blood Culture ID Panel (Reflexed)     Status: Abnormal   Collection Time: 08/30/16  7:16 PM  Result Value Ref Range Status   Enterococcus species NOT DETECTED NOT DETECTED Final   Listeria monocytogenes NOT DETECTED NOT DETECTED Final   Staphylococcus species DETECTED (A) NOT DETECTED Final    Comment: Methicillin (oxacillin) resistant coagulase negative staphylococcus. Possible blood culture  contaminant (unless isolated from more than one blood culture draw or clinical case suggests pathogenicity). No  antibiotic treatment is indicated for blood  culture contaminants. CRITICAL RESULT CALLED TO, READ BACK BY AND VERIFIED WITH: A [facility] PHARMD 1928 08/31/16 A BROWNING    Staphylococcus aureus NOT DETECTED NOT DETECTED Final   Methicillin resistance DETECTED (A) NOT DETECTED Final    Comment: CRITICAL RESULT CALLED TO, READ BACK BY AND VERIFIED WITH: A [facility] PHARMD 1928 08/31/16 A BROWNING    Streptococcus species NOT DETECTED NOT DETECTED Final   Streptococcus agalactiae NOT DETECTED NOT DETECTED Final   Streptococcus pneumoniae NOT DETECTED NOT DETECTED Final   Streptococcus pyogenes NOT DETECTED NOT DETECTED Final   Acinetobacter baumannii NOT DETECTED NOT DETECTED Final   Enterobacteriaceae species NOT DETECTED NOT DETECTED Final   Enterobacter cloacae complex NOT DETECTED NOT DETECTED Final   Escherichia coli NOT DETECTED NOT DETECTED Final   Klebsiella oxytoca NOT DETECTED NOT DETECTED Final   Klebsiella pneumoniae NOT DETECTED NOT DETECTED Final   Proteus species NOT DETECTED NOT DETECTED Final   Serratia marcescens NOT DETECTED NOT DETECTED Final   Haemophilus influenzae NOT DETECTED NOT DETECTED Final   Neisseria meningitidis NOT DETECTED NOT DETECTED Final   Pseudomonas aeruginosa NOT DETECTED NOT DETECTED Final   Candida albicans NOT DETECTED NOT DETECTED Final   Candida glabrata NOT DETECTED NOT DETECTED Final   Candida krusei NOT DETECTED NOT DETECTED Final   Candida parapsilosis NOT DETECTED NOT DETECTED Final   Candida tropicalis NOT DETECTED NOT DETECTED Final    Comment: Performed at [facility], [place] [address] [place], [place] [address]  Urine culture     Status: Abnormal   Collection Time: 08/31/16  3:26 AM  Result Value Ref Range Status   Specimen Description URINE, RANDOM  Final   Special Requests NONE  Final   Culture (A)  Final    <10,000  COLONIES/mL INSIGNIFICANT GROWTH Performed at [place] [address], [place], [place] [address]    Report Status 09/01/2016 FINAL  Final  Culture, blood (routine x 2)     Status: None   Collection Time: 09/01/16 10:25 AM  Result Value Ref Range Status   Specimen Description BLOOD LEFT ARM  Final   Special Requests IN PEDIATRIC BOTTLE Blood Culture adequate volume  Final   Culture   Final    NO GROWTH 5 DAYS Performed at [facility], [place] [address] [place], [place] [address]    Report Status 09/06/2016 FINAL  Final  Culture, blood (routine x 2)     Status: None   Collection Time: 09/01/16 10:25 AM  Result Value Ref Range Status   Specimen Description BLOOD LEFT ARM  Final   Special Requests IN PEDIATRIC BOTTLE Blood Culture adequate volume  Final   Culture   Final    NO GROWTH 5 DAYS Performed at [facility], [place] [address], [place], [place] [address]    Report Status 09/06/2016 FINAL  Final         Radiology Studies: Dg Abd 1 View  Result Date: 09/06/2016 CLINICAL DATA:  Abdominal distension EXAM: ABDOMEN - 1 VIEW COMPARISON:  09/05/2016 abdominal radiographs FINDINGS: Bilateral nephroureteral stents are stable in position. Additional surgical drain overlying the pelvis is stable in position. Skin staples overlie the left abdomen. Surgical clips overlie the bilateral deep pelvis. Mild diffuse gaseous distention of the small and large bowel, mildly increased. No evidence of pneumatosis or pneumoperitoneum. No radiopaque urolithiasis. IMPRESSION: Mild diffuse gaseous distention of the small and large bowel, mildly  increased, most compatible with mild adynamic ileus in the postoperative setting. Electronically Signed   By: Ilona Sorrel M.D.   On: 09/06/2016 12:52   Dg Abd 2 Views  Result Date: 09/05/2016 CLINICAL DATA:  Post op ureteral conduit surgery x 2 days ago, pt c/o intermittent pain, firm abd on palpation per chart. Pt quadriplegic, has surgical  drains in place on left side. Unable to raise LEFT arm for imaging in decubitus view. EXAM: ABDOMEN - 2 VIEW COMPARISON:  07/10/2016 bowel gas pattern is nonobstructive. Patient has bilateral ureterostomy tubes to ileal conduit in the right lower quadrant. Surgical clips overlie the abdomen. No free intraperitoneal air on the decubitus view of the abdomen. Chronic changes in both hips. No evidence for free intraperitoneal air. Small hiatal hernia noted. FINDINGS: The bowel gas pattern is normal. There is no evidence of free air. No radio-opaque calculi or other significant radiographic abnormality is seen. IMPRESSION: 1. Postoperative changes. 2. Ureteral stents. 3. Hiatal hernia. 4. Nonobstructive bowel gas pattern. Electronically Signed   By: Nolon Nations M.D.   On: 09/05/2016 13:36        Scheduled Meds: . alvimopan  12 mg Oral BID  . chlorhexidine  15 mL Mouth Rinse BID  . feeding supplement  1 Container Oral TID BM  . heparin subcutaneous  5,000 Units Subcutaneous Q8H  . mouth rinse  15 mL Mouth Rinse q12n4p   Continuous Infusions: . dextrose 5 % and 0.45 % NaCl with KCl 20 mEq/L 50 mL/hr at 09/06/16 2135  . levofloxacin (LEVAQUIN) IV Stopped (09/06/16 1500)     LOS: 8 days    Time spent: 30 mins     Cadince Hilscher Arsenio Loader, MD [facility] Pager [phone]   If 7PM-7AM, please contact night-coverage [email or web address] Password [email or web address] 09/07/2016, 11:31 AM

## 2016-09-08 ENCOUNTER — Encounter (HOSPITAL_COMMUNITY): Payer: Self-pay

## 2016-09-08 DIAGNOSIS — E43 Unspecified severe protein-calorie malnutrition: Secondary | ICD-10-CM

## 2016-09-08 LAB — IRON AND TIBC
IRON: 28 ug/dL (ref 28–170)
SATURATION RATIOS: 16 % (ref 10.4–31.8)
TIBC: 172 ug/dL — AB (ref 250–450)
UIBC: 144 ug/dL

## 2016-09-08 LAB — FERRITIN: Ferritin: 205 ng/mL (ref 11–307)

## 2016-09-08 LAB — TSH: TSH: 4.266 u[IU]/mL (ref 0.350–4.500)

## 2016-09-08 LAB — CREATININE, FLUID (PLEURAL, PERITONEAL, JP DRAINAGE)

## 2016-09-08 LAB — VITAMIN B12: Vitamin B-12: 261 pg/mL (ref 180–914)

## 2016-09-08 MED ORDER — BISACODYL 10 MG RE SUPP
10.0000 mg | Freq: Once | RECTAL | Status: AC
  Administered 2016-09-08: 10 mg via RECTAL
  Filled 2016-09-08: qty 1

## 2016-09-08 MED ORDER — ENSURE ENLIVE PO LIQD
237.0000 mL | Freq: Two times a day (BID) | ORAL | Status: DC
  Administered 2016-09-08 – 2016-09-10 (×5): 237 mL via ORAL

## 2016-09-08 MED ORDER — PRO-STAT SUGAR FREE PO LIQD
30.0000 mL | Freq: Two times a day (BID) | ORAL | Status: DC
  Administered 2016-09-08 – 2016-09-10 (×4): 30 mL via ORAL
  Filled 2016-09-08 (×4): qty 30

## 2016-09-08 MED ORDER — POTASSIUM CHLORIDE CRYS ER 20 MEQ PO TBCR
40.0000 meq | EXTENDED_RELEASE_TABLET | Freq: Once | ORAL | Status: AC
  Administered 2016-09-08: 40 meq via ORAL
  Filled 2016-09-08: qty 2

## 2016-09-08 NOTE — Progress Notes (Signed)
PROGRESS NOTE    Deanna Baldwin  VQQ:[id number] DOB: 1975/05/23 DOA: 08/30/2016 PCP: [name], DO   Brief Narrative:   41 year old female with past medical history of multiple sclerosis and neurogenic bladder presented to the ER with UTI/urosepsis. Patient was started on vancomycin and Zosyn and admitted to urology service. Medicine team was consulted due to hypotension and fever. Eventually patient's culture grew staph hominis which was likely contaminated. Antibiotics were transitioned to Levaquin to complete the course per urology. Patient underwent ileal conduit urinary diversion 5 days ago and postop she was transferred to medicine service. Postoperatively patient developed adynamic ileus but has slowly been improving.  Assessment & Plan:   Principal Problem:   Hypotension Active Problems:   UTI (urinary tract infection)   Neurogenic bladder   Sepsis (HCC)   Fever, unspecified   Abnormal liver function   Pressure injury of skin  Adynamic ileus -Likely secondary to postop from recent abdominal surgery -Conservative management. Slowly advance diet as tolerated. Antiemetics as needed. -Antibiotics as needed. Advised nursing staff and physical therapy to get patient out of bed to chair -Reglan when necessary to help with his promotility properties as well. Entereg has been ordered by urology -If nausea vomiting worsens we'll repeat abdominal imaging and may end up needing NG tube. -Supportive care -Replete potassium and keep it at or little above 4.0, magnesium 1.8  Hypotension/fever; improved  Sepsis secondary to likely urinary tract infection; improved  -Cultures have been ordered and one of the cultures grew Staphylococcus hominis. Repeat cultures have been ordered -NGTD @ 5 days. Origin of blood cultures likely contaminate as repeat cultures have failed to grow but this also could be due to antibiotics that were already given. -Vancomycin and Zosyn has been stopped but  Levaquin restarted by urology. Last day tomorrow -CTA of the chest done 7/31- was negative for pulmonary embolism, but showed atelectasis with possible early pneumonia and chronic bronchitis. -A.m. cortisol normal-15 -Echocardiogram done on 08/31/2016 shows normal ejection fraction. -Her current blood pressure of around systolic [phone] is at her baseline. Closely monitor urine output.   -POD #5 - ileal conduit urinary diversion. Plans to remove staple in 2 weeks. Adv diet as tolerated   Chronic hypertension -Patient is asymptomatic at this time. On low-dose Florinef and Midorine   History of neurogenic bladder; STABLE  -This is likely secondary to her multiple sclerosis. Management per urology  Multiple Sclerosis- stable   Anemia -Likely secondary to chronic disease and poor nutritional status -Iron studies done, B12 level-261, folate pending. TSH is normal  Moderate to severe protein calorie malnutrition -Encourage oral intake- currently on clears and advance as tolerated. She has poor nutritional status and low body was urged therefore low threshold to place PICC line and start her on TPN. We'll still give her another day or 2 to see if her bowel function regained.  Bowel regimen prn.   DVT prophylaxis: Heparin Code Status: Full code Family Communication:  None at bedside Disposition Plan: To be determined  Antimicrobials:   Vanc  8/1> 8/4  Zosyn 8/1 > 8/4  Cipro 8/4>8/5  Levaquin 8/7 >>   Subjective: Patient denies any symptoms of nausea and vomiting. She was able to tolerate clear liquid last night and this morning. She states her abdomen feels a little better. Denies passing any flatulence.  Objective: Vitals:   09/07/16 0528 09/07/16 1513 09/07/16 2109 09/08/16 0547  BP: (!) 88/72 (!) 94/58 (!) 93/58 (!) 85/56  Pulse: 99 (!) 110 (!)  105 100  Resp: 17 16 18 18   Temp: 98.2 F (36.8 C) 98.1 F (36.7 C) 98.3 F (36.8 C) 98.8 F (37.1 C)  TempSrc: Oral Oral  Oral Oral  SpO2: 95% 99% 100% 95%  Weight:      Height:        Intake/Output Summary (Last 24 hours) at 09/08/16 1205 Last data filed at 09/08/16 0700  Gross per 24 hour  Intake             1690 ml  Output             1550 ml  Net              140 ml   Filed Weights   08/30/16 1214  Weight: 38.5 kg (84 lb 14.4 oz)    Examination:  General exam: calm and comfortable this morning, frail thin cachectic appearing.  Respiratory system: Clear to auscultation. Respiratory effort normal. Cardiovascular system: tachycardiac S1 & S2 heard, RRR. No JVD, murmurs, rubs, gallops or clicks. No pedal edema. Gastrointestinal system: Abdomen is Tense and distended. Surgical scar noted soft and nontender. No organomegaly or masses felt. Normal bowel sounds heard. Ostomy bag in place Central nervous system: Alert and oriented. No focal neurological deficits. Extremities: Symmetric 5 x 5 power. Skin: No rashes, lesions or ulcers Psychiatry: Judgement and insight appear normal. Mood & affect appropriate.    Data Reviewed:   CBC:  Recent Labs Lab 09/03/16 0508 09/03/16 1335 09/05/16 1317 09/06/16 0522 09/07/16 0458  WBC 4.9  --  10.7* 9.3 5.6  NEUTROABS  --   --   --   --  3.9  HGB 8.0* 10.6* 9.6* 8.1* 8.6*  HCT 26.0* 35.2* 32.0* 27.1* 28.7*  MCV 84.7  --  84.9 85.5 85.4  PLT 606*  --  528* 572* 945*   Basic Metabolic Panel:  Recent Labs Lab 09/02/16 0434 09/03/16 0508 09/03/16 1335 09/04/16 0520 09/06/16 0522 09/07/16 0458  NA 142 141 145 142 142 142  K 3.6 4.0 4.0 3.9 3.9 3.5  CL 109 106 106 106 110 105  CO2 26 26 25 27 26 28   GLUCOSE 95 83 155* 93 86 93  BUN 5* 6 <5* 5* <5* <5*  CREATININE <0.30* <0.30* 0.45 <0.30* <0.30* <0.30*  CALCIUM 8.9 9.1 9.8 8.7* 8.5* 8.7*  MG 1.9 1.9  --  1.8  --  1.8  PHOS  --   --   --   --   --  4.3   GFR: CrCl cannot be calculated (This lab value cannot be used to calculate CrCl because it is not a number: <0.30). Liver Function  Tests:  Recent Labs Lab 09/03/16 0508 09/07/16 0458  AST 20 16  ALT 32 18  ALKPHOS 113 123  BILITOT 0.3 0.4  PROT 6.3* 5.9*  ALBUMIN 3.0* 2.6*   No results for input(s): LIPASE, AMYLASE in the last 168 hours. No results for input(s): AMMONIA in the last 168 hours. Coagulation Profile: No results for input(s): INR, PROTIME in the last 168 hours. Cardiac Enzymes: No results for input(s): CKTOTAL, CKMB, CKMBINDEX, TROPONINI in the last 168 hours. BNP (last 3 results) No results for input(s): PROBNP in the last 8760 hours. HbA1C: No results for input(s): HGBA1C in the last 72 hours. CBG: No results for input(s): GLUCAP in the last 168 hours. Lipid Profile:  Recent Labs  09/07/16 0458  TRIG 138   Thyroid Function Tests:  Recent Labs  09/08/16 0509  TSH 4.266   Anemia Panel:  Recent Labs  09/08/16 0509  VITAMINB12 261  FERRITIN 205  TIBC 172*  IRON 28   Sepsis Labs: No results for input(s): PROCALCITON, LATICACIDVEN in the last 168 hours.  Recent Results (from the past 240 hour(s))  Surgical pcr screen     Status: Abnormal   Collection Time: 08/30/16  1:03 PM  Result Value Ref Range Status   MRSA, PCR POSITIVE (A) NEGATIVE Corrected    Comment: RESULT CALLED TO, READ BACK BY AND VERIFIED WITH: ORA,I. RN ON 07.30.18 AT 1615 BY INGLEE CORRECTED ON 07/30 AT 1614: PREVIOUSLY REPORTED AS POSITIVE RESULT CALLED TO, READ BACK BY AND VERIFIED WITH: ORA,I RN ON 07.30.18 AT 1415 BY INGLEE    Staphylococcus aureus POSITIVE (A) NEGATIVE Final    Comment:        The Xpert SA Assay (FDA approved for NASAL specimens in patients over 18 years of age), is one component of a comprehensive surveillance program.  Test performance has been validated by [facility] for patients greater than or equal to 86 year old. It is not intended to diagnose infection nor to guide or monitor treatment.   Culture, blood (routine x 2)     Status: Abnormal   Collection Time: 08/30/16   7:16 PM  Result Value Ref Range Status   Specimen Description BLOOD BLOOD RIGHT HAND  Final   Special Requests IN PEDIATRIC BOTTLE Blood Culture adequate volume  Final   Culture  Setup Time   Final    GRAM POSITIVE COCCI IN CLUSTERS IN PEDIATRIC BOTTLE CRITICAL RESULT CALLED TO, READ BACK BY AND VERIFIED WITH: A [facility] PHARMD 1928 08/31/16 A BROWNING Performed at [facility], [place] [address], [place], [place] [address]    Culture STAPHYLOCOCCUS HOMINIS (A)  Final   Report Status 09/02/2016 FINAL  Final   Organism ID, Bacteria STAPHYLOCOCCUS HOMINIS  Final      Susceptibility   Staphylococcus hominis - MIC*    CIPROFLOXACIN <=0.5 SENSITIVE Sensitive     ERYTHROMYCIN <=0.25 SENSITIVE Sensitive     GENTAMICIN <=0.5 SENSITIVE Sensitive     OXACILLIN 0.5 RESISTANT Resistant     TETRACYCLINE >=16 RESISTANT Resistant     VANCOMYCIN 1 SENSITIVE Sensitive     TRIMETH/SULFA 80 RESISTANT Resistant     CLINDAMYCIN <=0.25 SENSITIVE Sensitive     RIFAMPIN <=0.5 SENSITIVE Sensitive     Inducible Clindamycin NEGATIVE Sensitive     * STAPHYLOCOCCUS HOMINIS  Culture, blood (routine x 2)     Status: Abnormal   Collection Time: 08/30/16  7:16 PM  Result Value Ref Range Status   Specimen Description BLOOD BLOOD RIGHT ARM  Final   Special Requests IN PEDIATRIC BOTTLE Blood Culture adequate volume  Final   Culture  Setup Time   Final    GRAM POSITIVE COCCI IN CLUSTERS IN PEDIATRIC BOTTLE CRITICAL RESULT CALLED TO, READ BACK BY AND VERIFIED WITH: A [facility] PHARMD 1928 08/31/16 A BROWNING Performed at [facility], [place] [address], [place], [place] [address]    Culture STAPHYLOCOCCUS HOMINIS (A)  Final   Report Status 09/02/2016 FINAL  Final  Blood Culture ID Panel (Reflexed)     Status: Abnormal   Collection Time: 08/30/16  7:16 PM  Result Value Ref Range Status   Enterococcus species NOT DETECTED NOT DETECTED Final   Listeria monocytogenes NOT DETECTED NOT DETECTED Final   Staphylococcus  species DETECTED (A) NOT DETECTED Final  Comment: Methicillin (oxacillin) resistant coagulase negative staphylococcus. Possible blood culture contaminant (unless isolated from more than one blood culture draw or clinical case suggests pathogenicity). No antibiotic treatment is indicated for blood  culture contaminants. CRITICAL RESULT CALLED TO, READ BACK BY AND VERIFIED WITH: A [facility] PHARMD 1928 08/31/16 A BROWNING    Staphylococcus aureus NOT DETECTED NOT DETECTED Final   Methicillin resistance DETECTED (A) NOT DETECTED Final    Comment: CRITICAL RESULT CALLED TO, READ BACK BY AND VERIFIED WITH: A [facility] PHARMD 1928 08/31/16 A BROWNING    Streptococcus species NOT DETECTED NOT DETECTED Final   Streptococcus agalactiae NOT DETECTED NOT DETECTED Final   Streptococcus pneumoniae NOT DETECTED NOT DETECTED Final   Streptococcus pyogenes NOT DETECTED NOT DETECTED Final   Acinetobacter baumannii NOT DETECTED NOT DETECTED Final   Enterobacteriaceae species NOT DETECTED NOT DETECTED Final   Enterobacter cloacae complex NOT DETECTED NOT DETECTED Final   Escherichia coli NOT DETECTED NOT DETECTED Final   Klebsiella oxytoca NOT DETECTED NOT DETECTED Final   Klebsiella pneumoniae NOT DETECTED NOT DETECTED Final   Proteus species NOT DETECTED NOT DETECTED Final   Serratia marcescens NOT DETECTED NOT DETECTED Final   Haemophilus influenzae NOT DETECTED NOT DETECTED Final   Neisseria meningitidis NOT DETECTED NOT DETECTED Final   Pseudomonas aeruginosa NOT DETECTED NOT DETECTED Final   Candida albicans NOT DETECTED NOT DETECTED Final   Candida glabrata NOT DETECTED NOT DETECTED Final   Candida krusei NOT DETECTED NOT DETECTED Final   Candida parapsilosis NOT DETECTED NOT DETECTED Final   Candida tropicalis NOT DETECTED NOT DETECTED Final    Comment: Performed at [facility], [place]. [address] [place], [place] [address]  Urine culture     Status: Abnormal   Collection Time: 08/31/16  3:26 AM   Result Value Ref Range Status   Specimen Description URINE, RANDOM  Final   Special Requests NONE  Final   Culture (A)  Final    <10,000 COLONIES/mL INSIGNIFICANT GROWTH Performed at [place] [address] [place], [place] [address]    Report Status 09/01/2016 FINAL  Final  Culture, blood (routine x 2)     Status: None   Collection Time: 09/01/16 10:25 AM  Result Value Ref Range Status   Specimen Description BLOOD LEFT ARM  Final   Special Requests IN PEDIATRIC BOTTLE Blood Culture adequate volume  Final   Culture   Final    NO GROWTH 5 DAYS Performed at [facility], [place] [address] [place], [place] [address]    Report Status 09/06/2016 FINAL  Final  Culture, blood (routine x 2)     Status: None   Collection Time: 09/01/16 10:25 AM  Result Value Ref Range Status   Specimen Description BLOOD LEFT ARM  Final   Special Requests IN PEDIATRIC BOTTLE Blood Culture adequate volume  Final   Culture   Final    NO GROWTH 5 DAYS Performed at [facility], [place] [address], [place], [place] [address]    Report Status 09/06/2016 FINAL  Final         Radiology Studies: Dg Abd 1 View  Result Date: 09/06/2016 CLINICAL DATA:  Abdominal distension EXAM: ABDOMEN - 1 VIEW COMPARISON:  09/05/2016 abdominal radiographs FINDINGS: Bilateral nephroureteral stents are stable in position. Additional surgical drain overlying the pelvis is stable in position. Skin staples overlie the left abdomen. Surgical clips overlie the bilateral deep pelvis. Mild diffuse gaseous distention of the small  and large bowel, mildly increased. No evidence of pneumatosis or pneumoperitoneum. No radiopaque urolithiasis. IMPRESSION: Mild diffuse gaseous distention of the small and large bowel, mildly increased, most compatible with mild adynamic ileus in the postoperative setting. Electronically Signed   By: Ilona Sorrel M.D.   On: 09/06/2016 12:52        Scheduled Meds: . alvimopan  12 mg Oral  BID  . chlorhexidine  15 mL Mouth Rinse BID  . feeding supplement (ENSURE ENLIVE)  237 mL Oral BID BM  . feeding supplement (PRO-STAT SUGAR FREE 64)  30 mL Oral BID  . fludrocortisone  0.1 mg Oral Daily  . heparin subcutaneous  5,000 Units Subcutaneous Q8H  . mouth rinse  15 mL Mouth Rinse q12n4p  . midodrine  2.5 mg Oral TID WC   Continuous Infusions: . dextrose 5 % and 0.45 % NaCl with KCl 20 mEq/L 50 mL/hr at 09/07/16 2001  . levofloxacin (LEVAQUIN) IV 750 mg (09/08/16 1130)     LOS: 9 days    Time spent: 32 mins     Ade Stmarie Arsenio Loader, MD [facility] Pager [phone]   If 7PM-7AM, please contact night-coverage [email or web address] Password [email or web address] 09/08/2016, 12:05 PM

## 2016-09-08 NOTE — Consult Note (Signed)
[place] Nurse ostomy follow up Stoma type/location: RLQ ileal conduit with two stents intact.  Red = Right.  Blue = left. Stomal assessment/size: 1 and 1/4 inch red, round, raised, moist with os at center.  Currently leaking around stents/ Peristomal assessment: Intact, macerated.  Pouch skin barrier is washed out and leakage was imminent. Treatment options for stomal/peristomal skin: Skin barrier ring, convex pouch. Output: clear yellow urine Ostomy pouching: 1pc.convex pouching system with skin barrier ring.  Education provided: Patient is very engaged with process of sizing and is asking questions about frequency of pouching system changes.  She had thought they might be once weekly.  I teach her that the stoma will shrink (edema will subside) over the next 3-4 weeks and that changing and resizing will be necessary to ensure proper fit. I show her the back of the skin barrier and teach her that this is a way we can trouble shoot leakages I.e., by noting where the washout is occurring. I teach that her leakage toward midline is most likely due to positioning on the left side, causing urine to pool in the upper left-hand corner of the pouch. I teach that after ostomy is the same size for a couple of weeks that she will be able to obtain pouches that are pre-cut to her size and that this is possible due to the stoma being round. She appreciates discussion today. Enrolled patient in [facility] Start Discharge program: Yes [place] nursing team will follow while in house; my partners will see in my absence this week.  We will remain available to this patient, the nursing, surgical and medical teams.   Thanks, Maudie Flakes, MSN, RN, [place], Arther Abbott  Pager# [phone]

## 2016-09-08 NOTE — Progress Notes (Signed)
PT Note / Screen  Patient Details Name: Deanna Baldwin MRN: [id number] DOB: January 19, 1976   Cancelled Treatment:    Reason Eval/Treat Not Completed: PT screened, no needs identified, will sign off Pt is from SNF and bedbound at baseline.  Pt uses lift for OOB at SNF. PROM and use of lift to chair are not skilled physical therapy services and can be deferred to nursing at this time.  Pt is not appropriate for skilled PT needs.  PT to sign off.   Amiley Shishido,KATHrine E 09/08/2016, 8:52 AM Carmelia Bake, PT, DPT 09/08/2016 Pager: [phone]

## 2016-09-08 NOTE — Progress Notes (Signed)
CSW continuing to follow and will assist patient with discharge planing back to [facility] SNF when patient is medically stable for discharge. CSW provided  update to patient's SNF.  Abundio Miu, [place] Social Worker [facility] Cell#: [phone]

## 2016-09-08 NOTE — Progress Notes (Signed)
Patient ID: Deanna Baldwin, female   DOB: [birth year], 41 y.o.   MRN: [id number]  Patient doing well.  Tolerating full liquids today.  Had BM.  Denies nausea throughout the day.

## 2016-09-08 NOTE — Progress Notes (Signed)
Patient ID: Deanna Baldwin, female   DOB: Jul 07, 1975, 41 y.o.   MRN: [id number]  5 Days Post-Op Subjective: Over the past 24 hours, patient was advanced to clears. Took in 240cc PO. Continues to have mild nausea and belching, but beginning to pass small amounts of flatus. No vomiting. Continues on entereg and IVF @ 50cc/hr.   Started on florinef and midodrine due to low blood pressure.   No acute events overnight.  Objective: Vital signs in last 24 hours: Temp:  [98.1 F (36.7 C)-98.8 F (37.1 C)] 98.8 F (37.1 C) (08/08 0547) Pulse Rate:  [100-110] 100 (08/08 0547) Resp:  [16-18] 18 (08/08 0547) BP: (85-94)/(56-58) 85/56 (08/08 0547) SpO2:  [95 %-100 %] 95 % (08/08 0547)  Intake/Output from previous day: 08/07 0701 - 08/08 0700 In: 1025 [P.O.:240; I.V.:1450] Out: 1550 [Urine:1400; Drains:150] Intake/Output this shift: No intake/output data recorded.  Physical Exam:  General: Alert and oriented CV: RRR Lungs: Clear Abdomen: Firm with bowel sounds in all 4 quadrants. Urostomy pink and urine is clear, exam stable from yesterday. Incisions: C/D/I with staples in place  Ext: NT, No erythema  Lab Results:  Recent Labs  09/05/16 1317 09/06/16 0522 09/07/16 0458  HGB 9.6* 8.1* 8.6*  HCT 32.0* 27.1* 28.7*   BMET  Recent Labs  09/06/16 0522 09/07/16 0458  NA 142 142  K 3.9 3.5  CL 110 105  CO2 26 28  GLUCOSE 86 93  BUN <5* <5*  CREATININE <0.30* <0.30*  CALCIUM 8.5* 8.7*     Studies/Results: Dg Abd 1 View  Result Date: 09/06/2016 CLINICAL DATA:  Abdominal distension EXAM: ABDOMEN - 1 VIEW COMPARISON:  09/05/2016 abdominal radiographs FINDINGS: Bilateral nephroureteral stents are stable in position. Additional surgical drain overlying the pelvis is stable in position. Skin staples overlie the left abdomen. Surgical clips overlie the bilateral deep pelvis. Mild diffuse gaseous distention of the small and large bowel, mildly increased. No evidence of pneumatosis or  pneumoperitoneum. No radiopaque urolithiasis. IMPRESSION: Mild diffuse gaseous distention of the small and large bowel, mildly increased, most compatible with mild adynamic ileus in the postoperative setting. Electronically Signed   By: Ilona Sorrel M.D.   On: 09/06/2016 12:52    Assessment/Plan: POD # 5 s/p ileal conduit urinary diversion - She still has a postoperative ileus, hope that she will continue to open up over the next day or so. Will advance diet, however if nutritional intake does not suffice due to her chronic malnourishment, recommend considering TPN.   1) S/P ileal conduit: Advance to thick liquids today. Repeat suppository. Check JP cr. Recommend continued IV Tylenol and Toradol for her pain to minimize narcotics. 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 2) Bacteremia:  levofloxacin through Thurs 8/9 to complete 10 day HCAP pneumonia treatment.  3) Wound care:  Appreciate WOC nursing. 4) Anemia: Has chronic anemia (baseline Hgb 8 before surgery). Hgb 8.6 yesterday 5) Hypotension: Appreciate medicine assistance with her post operative care      The patient will be scheduled for follow-up with me in 2 weeks for staple removal and stent removal. We will organize this and contact the patient and her facility for logistics.   LOS: 9 days   FILIPPOU, PAULINE L 09/08/2016, 8:33 AM

## 2016-09-09 ENCOUNTER — Encounter (HOSPITAL_COMMUNITY): Payer: Self-pay

## 2016-09-09 DIAGNOSIS — I959 Hypotension, unspecified: Secondary | ICD-10-CM

## 2016-09-09 DIAGNOSIS — N390 Urinary tract infection, site not specified: Secondary | ICD-10-CM

## 2016-09-09 LAB — COMPREHENSIVE METABOLIC PANEL
ALT: 13 U/L — AB (ref 14–54)
ANION GAP: 7 (ref 5–15)
AST: 15 U/L (ref 15–41)
Albumin: 2.5 g/dL — ABNORMAL LOW (ref 3.5–5.0)
Alkaline Phosphatase: 129 U/L — ABNORMAL HIGH (ref 38–126)
BUN: <5 mg/dL — ABNORMAL LOW (ref 6–20)
CHLORIDE: 107 mmol/L (ref 101–111)
CO2: 27 mmol/L (ref 22–32)
Calcium: 8.8 mg/dL — ABNORMAL LOW (ref 8.9–10.3)
Glucose, Bld: 84 mg/dL (ref 65–99)
POTASSIUM: 4.1 mmol/L (ref 3.5–5.1)
Sodium: 141 mmol/L (ref 135–145)
Total Bilirubin: 0.3 mg/dL (ref 0.3–1.2)
Total Protein: 5.4 g/dL — ABNORMAL LOW (ref 6.5–8.1)

## 2016-09-09 LAB — MAGNESIUM: MAGNESIUM: 1.8 mg/dL (ref 1.7–2.4)

## 2016-09-09 LAB — PHOSPHORUS: PHOSPHORUS: 3.5 mg/dL (ref 2.5–4.6)

## 2016-09-09 MED ORDER — CIPROFLOXACIN HCL 500 MG PO TABS: 500.0000 mg | ORAL_TABLET | Freq: Two times a day (BID) | ORAL | 0 refills | Status: DC

## 2016-09-09 NOTE — Progress Notes (Signed)
PROGRESS NOTE    Deanna Baldwin  PFX:[id number] DOB: October 17, 1975 DOA: 08/30/2016 PCP: [name], DO   Brief Narrative:   41 year old female with past medical history of multiple sclerosis and neurogenic bladder presented to the ER with UTI/urosepsis. Patient was started on vancomycin and Zosyn and admitted to urology service. Medicine team was consulted due to hypotension and fever. Eventually patient's culture grew staph hominis which was likely contaminated. Antibiotics were transitioned to Levaquin to complete the course per urology. Patient underwent ileal conduit urinary diversion 5 days ago and postop she was transferred to medicine service. Postoperatively patient developed adynamic ileus but has slowly been improving.  Assessment & Plan:   Principal Problem:   Hypotension Active Problems:   UTI (urinary tract infection)   Neurogenic bladder   Sepsis (HCC)   Fever, unspecified   Abnormal liver function   Pressure injury of skin  Adynamic ileus -Likely secondary to postop from recent abdominal surgery -Conservative management. Slowly advance diet as tolerated. Antiemetics as needed. -Antibiotics as needed. Advised nursing staff and physical therapy to get patient out of bed to chair -Reglan when necessary to help with his promotility properties as well. Entereg has been ordered by urology -Replete potassium and keep it at or little above 4.0, magnesium 1.8 - Appears to be improving, advance to regular diet today, continue to monitor  Hypotension/fever; improved  Sepsis secondary to likely urinary tract infection; improved  -Cultures have been ordered and one of the cultures grew Staphylococcus hominis. Repeat cultures have been ordered -NGTD @ 5 days. Origin of blood cultures likely contaminate as repeat cultures have failed to grow but this also could be due to antibiotics that were already given. -Particularly on vancomycin and Zosyn, currently on levofloxacin, she is  afebrile, no leukocytosis, last dose today. -CTA of the chest done 7/31- was negative for pulmonary embolism, but showed atelectasis with possible early pneumonia and chronic bronchitis. -A.m. cortisol normal-15 -Echocardiogram done on 08/31/2016 shows normal ejection fraction. -Her current blood pressure of around systolic 329-924 is at her baseline. Closely monitor urine output.   -POD #6 - ileal conduit urinary diversion. Plans to remove staple in 2 weeks.   Chronic hypertension -Patient is asymptomatic at this time. On low-dose Florinef and Midorine   History of neurogenic bladder; STABLE  -This is likely secondary to her multiple sclerosis. Management per urology  Multiple Sclerosis- stable   Anemia -Likely secondary to chronic disease and poor nutritional status -Iron studies done, B12 level-261, folate pending. TSH is normal  Moderate to severe protein calorie malnutrition -Encourage oral intake- currently on clears and advance as tolerated. With good appetite today, continue to monitor, check phosphorus and magnesium level in a.m.Marland Kitchen  Bowel regimen prn.   DVT prophylaxis: Heparin Code Status: Full code Family Communication:  None at bedside Disposition Plan: To be determined  Antimicrobials:   Vanc  8/1> 8/4  Zosyn 8/1 > 8/4  Cipro 8/4>8/5  Levaquin 8/7 >>   Subjective: Patient reports she is feeling much better today, no nausea, no vomiting, no abdominal pain, started soft diet this a.m., getting pancake, tolerating it so far .  Objective: Vitals:   09/08/16 0547 09/08/16 1500 09/08/16 2201 09/09/16 0547  BP: (!) 85/56 (!) 84/53 91/62 (!) 94/53  Pulse: 100 (!) 114 (!) 108 92  Resp: 18 19 16 16   Temp: 98.8 F (37.1 C) 99.2 F (37.3 C) 99.3 F (37.4 C) 98.2 F (36.8 C)  TempSrc: Oral Oral Oral Oral  SpO2: 95%  99% 99% 99%  Weight:      Height:        Intake/Output Summary (Last 24 hours) at 09/09/16 1153 Last data filed at 09/09/16 0945  Gross per 24  hour  Intake             1450 ml  Output             1405 ml  Net               45 ml   Filed Weights   08/30/16 1214  Weight: 38.5 kg (84 lb 14.4 oz)    Examination:  General exam: Thin-appearing female, sitting in the bed, eating breakfast of upper in distress  Respiratory system: Clear to auscultation bilaterally, use of accessory muscles Cardiovascular system:S1 & S2 heard, RRR. No JVD, murmurs, rubs, gallops or clicks. No pedal edema. Gastrointestinal system: Abdomen is soft, nondistended, Surgical scar with staples present, healing nicely, urostomy bag in place, with normal color urine draining . Central nervous system: Alert and oriented. No focal neurological deficits. Extremities: Symmetric 5 x 5 power. Skin: No rashes, lesions or ulcers Psychiatry: Judgement and insight appear normal. Mood & affect appropriate.    Data Reviewed:   CBC:  Recent Labs Lab 09/03/16 0508 09/03/16 1335 09/05/16 1317 09/06/16 0522 09/07/16 0458  WBC 4.9  --  10.7* 9.3 5.6  NEUTROABS  --   --   --   --  3.9  HGB 8.0* 10.6* 9.6* 8.1* 8.6*  HCT 26.0* 35.2* 32.0* 27.1* 28.7*  MCV 84.7  --  84.9 85.5 85.4  PLT 606*  --  528* 572* 468*   Basic Metabolic Panel:  Recent Labs Lab 09/03/16 0508 09/03/16 1335 09/04/16 0520 09/06/16 0522 09/07/16 0458 09/09/16 0501  NA 141 145 142 142 142 141  K 4.0 4.0 3.9 3.9 3.5 4.1  CL 106 106 106 110 105 107  CO2 26 25 27 26 28 27   GLUCOSE 83 155* 93 86 93 84  BUN 6 <5* 5* <5* <5* <5*  CREATININE <0.30* 0.45 <0.30* <0.30* <0.30* <0.30*  CALCIUM 9.1 9.8 8.7* 8.5* 8.7* 8.8*  MG 1.9  --  1.8  --  1.8 1.8  PHOS  --   --   --   --  4.3 3.5   GFR: CrCl cannot be calculated (This lab value cannot be used to calculate CrCl because it is not a number: <0.30). Liver Function Tests:  Recent Labs Lab 09/03/16 0508 09/07/16 0458 09/09/16 0501  AST 20 16 15   ALT 32 18 13*  ALKPHOS 113 123 129*  BILITOT 0.3 0.4 0.3  PROT 6.3* 5.9* 5.4*    ALBUMIN 3.0* 2.6* 2.5*   No results for input(s): LIPASE, AMYLASE in the last 168 hours. No results for input(s): AMMONIA in the last 168 hours. Coagulation Profile: No results for input(s): INR, PROTIME in the last 168 hours. Cardiac Enzymes: No results for input(s): CKTOTAL, CKMB, CKMBINDEX, TROPONINI in the last 168 hours. BNP (last 3 results) No results for input(s): PROBNP in the last 8760 hours. HbA1C: No results for input(s): HGBA1C in the last 72 hours. CBG: No results for input(s): GLUCAP in the last 168 hours. Lipid Profile:  Recent Labs  09/07/16 0458  TRIG 138   Thyroid Function Tests:  Recent Labs  09/08/16 0509  TSH 4.266   Anemia Panel:  Recent Labs  09/08/16 0509  VITAMINB12 261  FERRITIN 205  TIBC 172*  IRON 28  Sepsis Labs: No results for input(s): PROCALCITON, LATICACIDVEN in the last 168 hours.  Recent Results (from the past 240 hour(s))  Surgical pcr screen     Status: Abnormal   Collection Time: 08/30/16  1:03 PM  Result Value Ref Range Status   MRSA, PCR POSITIVE (A) NEGATIVE Corrected    Comment: RESULT CALLED TO, READ BACK BY AND VERIFIED WITH: ORA,I. RN ON 07.30.18 AT 1615 BY INGLEE CORRECTED ON 07/30 AT 1614: PREVIOUSLY REPORTED AS POSITIVE RESULT CALLED TO, READ BACK BY AND VERIFIED WITH: ORA,I RN ON 07.30.18 AT 1415 BY INGLEE    Staphylococcus aureus POSITIVE (A) NEGATIVE Final    Comment:        The Xpert SA Assay (FDA approved for NASAL specimens in patients over 40 years of age), is one component of a comprehensive surveillance program.  Test performance has been validated by [facility] for patients greater than or equal to 71 year old. It is not intended to diagnose infection nor to guide or monitor treatment.   Culture, blood (routine x 2)     Status: Abnormal   Collection Time: 08/30/16  7:16 PM  Result Value Ref Range Status   Specimen Description BLOOD BLOOD RIGHT HAND  Final   Special Requests IN PEDIATRIC  BOTTLE Blood Culture adequate volume  Final   Culture  Setup Time   Final    GRAM POSITIVE COCCI IN CLUSTERS IN PEDIATRIC BOTTLE CRITICAL RESULT CALLED TO, READ BACK BY AND VERIFIED WITH: A [facility] PHARMD 1928 08/31/16 A BROWNING Performed at [facility], [place] [address], [place], [place] [address]    Culture STAPHYLOCOCCUS HOMINIS (A)  Final   Report Status 09/02/2016 FINAL  Final   Organism ID, Bacteria STAPHYLOCOCCUS HOMINIS  Final      Susceptibility   Staphylococcus hominis - MIC*    CIPROFLOXACIN <=0.5 SENSITIVE Sensitive     ERYTHROMYCIN <=0.25 SENSITIVE Sensitive     GENTAMICIN <=0.5 SENSITIVE Sensitive     OXACILLIN 0.5 RESISTANT Resistant     TETRACYCLINE >=16 RESISTANT Resistant     VANCOMYCIN 1 SENSITIVE Sensitive     TRIMETH/SULFA 80 RESISTANT Resistant     CLINDAMYCIN <=0.25 SENSITIVE Sensitive     RIFAMPIN <=0.5 SENSITIVE Sensitive     Inducible Clindamycin NEGATIVE Sensitive     * STAPHYLOCOCCUS HOMINIS  Culture, blood (routine x 2)     Status: Abnormal   Collection Time: 08/30/16  7:16 PM  Result Value Ref Range Status   Specimen Description BLOOD BLOOD RIGHT ARM  Final   Special Requests IN PEDIATRIC BOTTLE Blood Culture adequate volume  Final   Culture  Setup Time   Final    GRAM POSITIVE COCCI IN CLUSTERS IN PEDIATRIC BOTTLE CRITICAL RESULT CALLED TO, READ BACK BY AND VERIFIED WITH: A [facility] PHARMD 1928 08/31/16 A BROWNING Performed at [facility], [place] [address] [place], [place] [address]    Culture STAPHYLOCOCCUS HOMINIS (A)  Final   Report Status 09/02/2016 FINAL  Final  Blood Culture ID Panel (Reflexed)     Status: Abnormal   Collection Time: 08/30/16  7:16 PM  Result Value Ref Range Status   Enterococcus species NOT DETECTED NOT DETECTED Final   Listeria monocytogenes NOT DETECTED NOT DETECTED Final   Staphylococcus species DETECTED (A) NOT DETECTED Final    Comment: Methicillin (oxacillin) resistant coagulase negative staphylococcus. Possible  blood culture contaminant (unless isolated from more than one blood culture draw or clinical case suggests pathogenicity). No  antibiotic treatment is indicated for blood  culture contaminants. CRITICAL RESULT CALLED TO, READ BACK BY AND VERIFIED WITH: A [facility] PHARMD 1928 08/31/16 A BROWNING    Staphylococcus aureus NOT DETECTED NOT DETECTED Final   Methicillin resistance DETECTED (A) NOT DETECTED Final    Comment: CRITICAL RESULT CALLED TO, READ BACK BY AND VERIFIED WITH: A [facility] PHARMD 1928 08/31/16 A BROWNING    Streptococcus species NOT DETECTED NOT DETECTED Final   Streptococcus agalactiae NOT DETECTED NOT DETECTED Final   Streptococcus pneumoniae NOT DETECTED NOT DETECTED Final   Streptococcus pyogenes NOT DETECTED NOT DETECTED Final   Acinetobacter baumannii NOT DETECTED NOT DETECTED Final   Enterobacteriaceae species NOT DETECTED NOT DETECTED Final   Enterobacter cloacae complex NOT DETECTED NOT DETECTED Final   Escherichia coli NOT DETECTED NOT DETECTED Final   Klebsiella oxytoca NOT DETECTED NOT DETECTED Final   Klebsiella pneumoniae NOT DETECTED NOT DETECTED Final   Proteus species NOT DETECTED NOT DETECTED Final   Serratia marcescens NOT DETECTED NOT DETECTED Final   Haemophilus influenzae NOT DETECTED NOT DETECTED Final   Neisseria meningitidis NOT DETECTED NOT DETECTED Final   Pseudomonas aeruginosa NOT DETECTED NOT DETECTED Final   Candida albicans NOT DETECTED NOT DETECTED Final   Candida glabrata NOT DETECTED NOT DETECTED Final   Candida krusei NOT DETECTED NOT DETECTED Final   Candida parapsilosis NOT DETECTED NOT DETECTED Final   Candida tropicalis NOT DETECTED NOT DETECTED Final    Comment: Performed at [facility], [place]. [address] [place], [place] [address]  Urine culture     Status: Abnormal   Collection Time: 08/31/16  3:26 AM  Result Value Ref Range Status   Specimen Description URINE, RANDOM  Final   Special Requests NONE  Final   Culture (A)  Final     <10,000 COLONIES/mL INSIGNIFICANT GROWTH Performed at [place] [address], [place], [place] [address]    Report Status 09/01/2016 FINAL  Final  Culture, blood (routine x 2)     Status: None   Collection Time: 09/01/16 10:25 AM  Result Value Ref Range Status   Specimen Description BLOOD LEFT ARM  Final   Special Requests IN PEDIATRIC BOTTLE Blood Culture adequate volume  Final   Culture   Final    NO GROWTH 5 DAYS Performed at [facility], [place] [address] [place], [place] [address]    Report Status 09/06/2016 FINAL  Final  Culture, blood (routine x 2)     Status: None   Collection Time: 09/01/16 10:25 AM  Result Value Ref Range Status   Specimen Description BLOOD LEFT ARM  Final   Special Requests IN PEDIATRIC BOTTLE Blood Culture adequate volume  Final   Culture   Final    NO GROWTH 5 DAYS Performed at [facility], [place] [address], [place], [place] [address]    Report Status 09/06/2016 FINAL  Final         Radiology Studies: No results found.      Scheduled Meds: . chlorhexidine  15 mL Mouth Rinse BID  . feeding supplement (ENSURE ENLIVE)  237 mL Oral BID BM  . feeding supplement (PRO-STAT SUGAR FREE 64)  30 mL Oral BID  . fludrocortisone  0.1 mg Oral Daily  . heparin subcutaneous  5,000 Units Subcutaneous Q8H  . mouth rinse  15 mL Mouth Rinse q12n4p  . midodrine  2.5 mg Oral TID WC   Continuous Infusions: . levofloxacin (LEVAQUIN) IV  750 mg (09/09/16 1103)     LOS: 10 days    Time spent: 32 mins     Chaney Ingram, MD [facility] Pager [phone]  If 7PM-7AM, please contact night-coverage [email or web address] Password [email or web address] 09/09/2016, 11:53 AM

## 2016-09-09 NOTE — Progress Notes (Signed)
Denym did well today. Tolerated regular diet without nausea or vomiting. Pain well controlled. JP creatinine checked, negative for urine leak, drain d/ced. Additional bowel movement this afternoon. Completed antibiotic course. Looking forward to going home.   Ready for discharge tomorrow from urologic perspective. Will write antibiotic rx for 3 days of peri-stent pull antibiotics, to start day before follow up appointment. Will follow up for staple removal, stent removal, and PO check on 8/21 at 9:45 am. Discharge restrictions placed in orders.   Jonna Clark, MD Urology [facility]

## 2016-09-09 NOTE — Progress Notes (Signed)
Patient ID: Deanna Baldwin, female   DOB: May 05, 1975, 41 y.o.   MRN: [id number]  6 Days Post-Op Subjective: Deanna Baldwin tolerated full liquids.  She had two bowel movements.  Denies nausea or vomiting.  Objective: Vital signs in last 24 hours: Temp:  [98.2 F (36.8 C)-99.3 F (37.4 C)] 98.2 F (36.8 C) (08/09 0547) Pulse Rate:  [92-114] 92 (08/09 0547) Resp:  [16-19] 16 (08/09 0547) BP: (84-94)/(53-62) 94/53 (08/09 0547) SpO2:  [99 %] 99 % (08/09 0547)  Intake/Output from previous day: 08/08 0701 - 08/09 0700 In: 1450 [I.V.:1150; IV Piggyback:300] Out: 1170 [Urine:1000; Drains:170] Intake/Output this shift: No intake/output data recorded.  Physical Exam:  General: Alert and oriented CV: RRR Lungs: Clear Abdomen: Still tight although she states this is back to her baseline tightness.  Positive bowel sounds. Ostomy pink with clear urine.  Drain with serous looking fluid. Incisions: C/D/I Ext: NT, No erythema  Lab Results:  Recent Labs  09/07/16 0458  HGB 8.6*  HCT 28.7*   BMET  Recent Labs  09/07/16 0458 09/09/16 0501  NA 142 141  K 3.5 4.1  CL 105 107  CO2 28 27  GLUCOSE 93 84  BUN <5* <5*  CREATININE <0.30* <0.30*  CALCIUM 8.7* 8.8*   Drain Cr < 0.3   Assessment/Plan: POD # 6 s/p ileal conduit urinary diversion - D/C drain - Advance to regular diet - Continue PT/OT - If tolerating diet over next 24 hours, it would be appropriate for her to be discharged back to nursing facility tomorrow   LOS: 10 days   Deanna Baldwin,LES 09/09/2016, 7:10 AM

## 2016-09-09 NOTE — Progress Notes (Signed)
Pharmacy Brief Note - Alvimopan (Entereg)   The standing order set for alvimopan (Entereg) now includes an automatic order to discontinue the drug after the patient has had a bowel movement. The change was approved by the [place] and the Medical Executive Committee.   This patient has had bowel movements documented by the physician. Therefore, alvimopan has been discontinued. If there are questions, please contact the pharmacy at [phone].   Thank you-  Clayburn Pert, PharmD, BCPS Pager: [phone] 09/09/2016  8:17 AM

## 2016-09-10 DIAGNOSIS — N319 Neuromuscular dysfunction of bladder, unspecified: Secondary | ICD-10-CM

## 2016-09-10 LAB — BASIC METABOLIC PANEL
Anion gap: 9 (ref 5–15)
BUN: 9 mg/dL (ref 6–20)
CHLORIDE: 106 mmol/L (ref 101–111)
CO2: 27 mmol/L (ref 22–32)
Calcium: 8.9 mg/dL (ref 8.9–10.3)
Glucose, Bld: 96 mg/dL (ref 65–99)
Potassium: 3.5 mmol/L (ref 3.5–5.1)
SODIUM: 142 mmol/L (ref 135–145)

## 2016-09-10 LAB — PHOSPHORUS: PHOSPHORUS: 3.2 mg/dL (ref 2.5–4.6)

## 2016-09-10 LAB — MAGNESIUM: MAGNESIUM: 1.8 mg/dL (ref 1.7–2.4)

## 2016-09-10 MED ORDER — MIDODRINE HCL 2.5 MG PO TABS: 2.5000 mg | ORAL_TABLET | Freq: Three times a day (TID) | ORAL | Status: DC

## 2016-09-10 MED ORDER — ENSURE ENLIVE PO LIQD: 237.0000 mL | Freq: Two times a day (BID) | ORAL | 12 refills | Status: DC

## 2016-09-10 MED ORDER — CIPROFLOXACIN HCL 500 MG PO TABS: 500.0000 mg | ORAL_TABLET | Freq: Two times a day (BID) | ORAL | 0 refills | Status: AC

## 2016-09-10 MED ORDER — PRO-STAT SUGAR FREE PO LIQD: 30.0000 mL | Freq: Two times a day (BID) | ORAL | 0 refills | Status: DC

## 2016-09-10 MED ORDER — BISACODYL 10 MG RE SUPP: 10.0000 mg | Freq: Every day | RECTAL | 0 refills | Status: DC | PRN

## 2016-09-10 MED ORDER — FLUDROCORTISONE ACETATE 0.1 MG PO TABS: 0.1000 mg | ORAL_TABLET | Freq: Every day | ORAL | Status: DC

## 2016-09-10 NOTE — Progress Notes (Addendum)
LCSW following for disposition: Patient medically stable to return to SNF:  [facility]  LCSW has updated information and sent clinicals to facility. Confirmed return with Sharyn Lull and she is in agreement. Patient will transport by EMS back to facility. Will update RN and complete discharge. Patient and family to be notified, patient has an aunt.  Call placed to Aunt to notify of discharge.  No other needs. DC back to nursing facility.  Lane Hacker, MSW Clinical Social Work: [employer] Coverage for :  [phone]

## 2016-09-10 NOTE — Progress Notes (Signed)
Nutrition Follow-up  DOCUMENTATION CODES:   Underweight, Severe malnutrition in context of chronic illness  INTERVENTION:  - Continue Ensure Enlive BID and Prostat BID.  - SNF staff to continue to encourage PO intakes of meals, supplements, and beverages. - RD will monitor for additional needs if pt unable to d/c today or over the weekend.   NUTRITION DIAGNOSIS:   Malnutrition (Severe) related to chronic illness (multiple sclerosis) as evidenced by severe depletion of body fat, severe depletion of muscle mass  -ongoing  GOAL:   Patient will meet greater than or equal to 90% of their needs - likely unmet  MONITOR:   PO intake, Supplement acceptance, Labs, Weight trends  ASSESSMENT:   Pt with PMH of progressive spastic MS, neurogenic bladder, and is chronically bed bound. Recently admitted 6/29 for foley catheter issues and UTI. Presents this admission with UTI, hypotension and multiple pressure injuries.   8/10 Pt on CLD 8/6, FLD 8/8, and advanced to Regular diet yesterday morning. She consumed 50% of dinner last night. She has been accepting all Ensure doses since 8/8 and all but one Prostat doses since 8/8. Weight +0.7 kg/2 lb since admission. Discharge order and summary in place from this AM. If pt unable to d/c today or over the weekend RD will assess for further needs and any limitations to adequate oral intake.   Medications reviewed. Labs reviewed; creatinine: <0.3 mg/dL, Alk Phos slightly elevated.    8/6 (8/6- xray shows mild distention of small and large bowl, most compatible with mild adynamic ileus in the postoperative setting.)  - Consulted for TPN recommendations.  Damaris Schooner with MD who states we are going to wait for 1-2 more days to see if PO intake increases.  - Spoke with pt who reports appetite has decreased since yesterday due to severe abdominal pain.  - Will try to tolerate clear liquids today.  - Will provide Boost Breeze supplementation while on clear diet  due to severely malnourished state of pt and increased protein needs.    Diet Order:  Diet regular Room service appropriate? Yes; Fluid consistency: Thin  Skin:   (stage 3- sacrum/ischial tuberosity, multiple foot wounds)  Last BM:  8/9  Height:   Ht Readings from Last 1 Encounters:  08/30/16 '5\' 9"'  (1.753 m)    Weight:   Wt Readings from Last 1 Encounters:  09/10/16 86 lb 6.7 oz (39.2 kg)    Ideal Body Weight:  59.3 kg (adjusted for paraplegia)  BMI:  Body mass index is 12.76 kg/m.  Estimated Nutritional Needs:   Kcal:  1500-1700 (38-44 kcal/kg)  Protein:  75-85 grams (2-2.2 g/kg)  Fluid:  >1.5 L/day   EDUCATION NEEDS:   No education needs identified at this time    Deanna Matin, MS, RD, LDN, CNSC Inpatient Clinical Dietitian Pager # [phone] After hours/weekend pager # [phone]

## 2016-09-10 NOTE — Care Management Important Message (Signed)
Important Message  Patient Details IM Letter given to Kathy/Case Manager to present to Patient Name: Deanna Baldwin MRN: [id number] Date of Birth: May 14, 1975   Medicare Important Message Given:  Yes    [name] 09/10/2016, 11:18 AM

## 2016-09-10 NOTE — Discharge Summary (Signed)
Deanna Baldwin, is a 41 y.o. female  DOB Apr 07, 1975  MRN [id number].  Admission date:  08/30/2016  Admitting Physician  Ardis Hughs, MD  Discharge Date:  09/10/2016   Primary MD  [name], DO  Recommendations for primary care physician for things to follow:  - Please check CBC, BMP during next visit. - Please continue with urostomy care as instructed. - Patient had prescheduled urology follow-up regarding stretching removal and stent removal on 8/21 at 9:45 AM, isn't sure patient keep up this appointment, patient to start taking cipro mg oral twice a day before the follow-up appointment.  Admission Diagnosis  URINARY INCONTINENCE   Discharge Diagnosis  URINARY INCONTINENCE    Principal Problem:   Hypotension Active Problems:   UTI (urinary tract infection)   Neurogenic bladder   Sepsis (HCC)   Fever, unspecified   Abnormal liver function   Pressure injury of skin      Past Medical History:  Diagnosis Date  . Buttock wound 03/03/2016  . Dysrhythmia    tachycardia  . MS (multiple sclerosis) ([place])   . Neutropenia ([place])   . Pneumonia 02/2016  . Protein calorie malnutrition ([place])   . Severe sepsis ([place]) 03/03/2016  . 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 UTI (urinary tract infection) 02/2016    Past Surgical History:  Procedure Laterality Date  . DIVERTING ILEOSTOMY N/A 09/03/2016   Procedure: ILEAL CONDUIT  URINARY DIVERSION OPEN;  Surgeon: Ardis Hughs, MD;  Location: [facility];  Service: Urology;  Laterality: N/A;  . NO PAST SURGERIES         History of present illness and  Hospital Course:     Kindly see H&P for history of present illness and admission details, please review complete Labs, Consult reports and Test reports for all details in brief  HPI  from the history and physical done on the day of admission on 08/30/2016  HPI: Deanna Baldwin is a 41 year-old female established patient who is here for further management and eval of a neurogenic bladder.  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   She does not have recurrent infections.   Her neurogenic bladder has been treated with chronic foley.   Seen last by Dr. Alinda Money. Had UDS in 2011 showing a non-compliant bladder with weak detrusor contractions and DSD. She was placed on CIC and Tamsulosin. She was then lost to follow-up.   The patient has a history of progressive spastic multiple sclerosis. She's been bedbound for several years now. In Jan 2018 the patient developed the flu and became severely ill. At that point, a Foley catheter was inserted. Prior to that she was voiding on her own and using tamsulosin. Over the next several months she developed a sacral decubitus ulcer. Given this, the catheter was kept in. 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 The foley catheter was upsized to 97F and 15cc placed in the balloon. Despite these  efforts she continued to leak despite the catheter. She was admitted for a UTI last month. She was seen in the hospital and we discussed treatment options at that point. I recommened a supravesical diversion.   Hospital Course   41 year old female with past medical history of multiple sclerosis and neurogenic bladder presented to the ER with UTI/urosepsis. Patient was started on vancomycin and Zosyn and admitted to urology service. Medicine team was consulted due to hypotension and fever. Eventually patient's culture grew staph hominis which was likely contaminated. Antibiotics were transitioned to Levaquin to complete the course per urology. Patient underwent ileal conduit urinary diversion 5 days ago and postop she was transferred to medicine service. Postoperatively patient developed adynamic ileus but has slowly been improving.  ileus -Likely secondary to postop  from recent abdominal surgery, initially with conservative management, bowel rest, electrolyte has been monitoring closely, a shunt with return of some tenderness bowel movement, started on clear liquid diet, and then fast, has been tolerating liquid diet for last 24 hours.  Hypotension/fever; improved  Sepsis secondary to likely urinary tract infection; improved  -Cultures have been ordered and one of the cultures grew Staphylococcus hominis. Repeat cultures have been ordered -NGTD @ 5 days. Origin of blood cultures likely contaminate as repeat cultures have failed to grow but this also could be due to antibiotics that were already given. -Initially on vancomycin and Zosyn, then she was transitioned to levofloxacin, she finished antibiotics 8/9  -CTA of the chest done 7/31- was negative for pulmonary embolism, but showed atelectasis with possible early pneumonia and chronic bronchitis. -A.m. cortisol normal-15 -Echocardiogram done on 08/31/2016 shows normal ejection fraction. -Her current blood pressure of around systolic [phone] is at her baseline. Closely monitor urine output.    Chronic hypotension -Patient is asymptomatic at this time. Started on low-dose midodrine  History of neurogenic bladder; STABLE  -This is likely secondary to her multiple sclerosis. Management per urology, status post ileal conduit urinary diversion, plan to follow with urology 8/21, 22 start Cipro one day prior to follow-up appointment  Multiple Sclerosis- stable   Anemia -Likely secondary to chronic disease and poor nutritional status -Iron studies done, B12 level-261, folate pending. TSH is normal  Moderate to severe protein calorie malnutrition -Encourage oral intake, continue with supplements on discharge Bowel regimen prn.   Discharge Condition:  Stable   Follow UP   Contact information for follow-up providers    Ardis Hughs, MD Follow up in 2 week(s).   Specialty:  Urology Why:   For wound re-check and stent removal Will follow up for staple removal, stent removal, and PO check on 8/21 at 9:45 am. Contact information: [place] [address] [phone]            Contact information for after-discharge care    Destination    [facility] Follow up.   Specialty:  [place] information: [address] [place] [id number]                    Discharge Instructions  and  Discharge Medications     Discharge Instructions    Discharge instructions    Complete by:  As directed    Follow with Primary MD [name], DO in 7 days   Get CBC, CMP, checked  by Primary MD next visit.    Activity: As tolerated with Full fall precautions use walker/cane & assistance as  needed   Disposition SNF   Diet: Regular diet , with feeding assistance and aspiration precautions.    On your next visit with your primary care physician please Get Medicines reviewed and adjusted.   Please request your Prim.MD to go over all Hospital Tests and Procedure/Radiological results at the follow up, please get all Hospital records sent to your Prim MD by signing hospital release before you go home.   If you experience worsening of your admission symptoms, develop shortness of breath, life threatening emergency, suicidal or homicidal thoughts you must seek medical attention immediately by calling 911 or calling your MD immediately  if symptoms less severe.  You Must read complete instructions/literature along with all the possible adverse reactions/side effects for all the Medicines you take and that have been prescribed to you. Take any new Medicines after you have completely understood and accpet all the possible adverse reactions/side effects.   Do not drive, operating heavy machinery, perform activities at heights, swimming or participation in water activities or provide baby  sitting services if your were admitted for syncope or siezures until you have seen by Primary MD or a Neurologist and advised to do so again.  Do not drive when taking Pain medications.    Do not take more than prescribed Pain, Sleep and Anxiety Medications  Special Instructions: If you have smoked or chewed Tobacco  in the last 2 yrs please stop smoking, stop any regular Alcohol  and or any Recreational drug use.  Wear Seat belts while driving.   Please note  You were cared for by a hospitalist during your hospital stay. If you have any questions about your discharge medications or the care you received while you were in the hospital after you are discharged, you can call the unit and asked to speak with the hospitalist on call if the hospitalist that took care of you is not available. Once you are discharged, your primary care physician will handle any further medical issues. Please note that NO REFILLS for any discharge medications will be authorized once you are discharged, as it is imperative that you return to your primary care physician (or establish a relationship with a primary care physician if you do not have one) for your aftercare needs so that they can reassess your need for medications and monitor your lab values.   Discharge wound care:    Complete by:  As directed    Urostomy care Empty pouch when 1/3  to  full of urine  Assist patient in emptying pouch Connect urinary pouch to bedside drain bag while in bed; disconnect patient from bedside drainage bag when OOB Change pouch twice weekly and PRN for leakage.  Write date of pouch application on pouch. Have spare pouch at bedside at all times  Stoma type/location: RLQ ileal conduit with two stents intact.  Red = Right.  Blue = left. Stomal assessment/size: 1 and 1/4 inch red, round, raised, moist with os at center.  Currently leaking around stents/  Careful not to dislodge stents while changing ostomy bag.   Lifting  restrictions    Complete by:  As directed    Nothing greater than 10 pounds for 4-6 weeks.   Ostomy Care    Complete by:  As directed    Empty pouch when 1/3  to  full of urine  Assist patient in emptying pouch Connect urinary pouch to bedside drain bag while in bed; disconnect patient from bedside drainage bag when OOB Change pouch  twice weekly and PRN for leakage.  Write date of pouch application on pouch. Have spare pouch at bedside at all times  Stoma type/location: RLQ ileal conduit with two stents intact.  Red = Right.  Blue = left. Stomal assessment/size: 1 and 1/4 inch red, round, raised, moist with os at center.  Currently leaking around stents/  Careful not to dislodge stents while changing ostomy bag.   Remove dressing in 48 hours    Complete by:  As directed    Remove drain site dressing in 24 hours. Change dressing as needed when soiled.     Allergies as of 09/10/2016   No Known Allergies     Medication List    STOP taking these medications   magnesium citrate Soln     TAKE these medications   acetaminophen 325 MG tablet Commonly known as:  TYLENOL Take 650 mg by mouth every 6 (six) hours as needed for mild pain.   B-12 PO Take 1 tablet by mouth daily.   baclofen 20 MG tablet Commonly known as:  LIORESAL Take 20 mg by mouth 4 (four) times daily.   bisacodyl 5 MG EC tablet Commonly known as:  DULCOLAX Take 5 mg by mouth daily. What changed:  Another medication with the same name was added. Make sure you understand how and when to take each.   bisacodyl 10 MG suppository Commonly known as:  DULCOLAX Place 1 suppository (10 mg total) rectally daily as needed for mild constipation or moderate constipation. What changed:  You were already taking a medication with the same name, and this prescription was added. Make sure you understand how and when to take each.   ciprofloxacin 500 MG tablet Commonly known as:  CIPRO Take 1 tablet (500 mg total) by mouth 2  (two) times daily. Begin day before follow up appointment with urology. Please start on 09/20/2016. Start taking on:  09/20/2016   DECUBI-VITE Caps Take 1 capsule by mouth daily.   feeding supplement (ENSURE ENLIVE) Liqd Take 237 mLs by mouth 2 (two) times daily between meals.   feeding supplement (PRO-STAT SUGAR FREE 64) Liqd Take 30 mLs by mouth 2 (two) times daily.   ferrous sulfate 325 (65 FE) MG tablet Take 1 tablet (325 mg total) by mouth 2 (two) times daily with a meal.   midodrine 2.5 MG tablet Commonly known as:  PROAMATINE Take 1 tablet (2.5 mg total) by mouth 3 (three) times daily with meals.   mirtazapine 7.5 MG tablet Commonly known as:  REMERON Take 7.5 mg by mouth at bedtime.   polyethylene glycol packet Commonly known as:  MIRALAX / GLYCOLAX Take 17 g by mouth daily.   vitamin C 500 MG tablet Commonly known as:  ASCORBIC ACID Take 500 mg by mouth 2 (two) times daily.         Diet and Activity recommendation: See Discharge Instructions above   Consults obtained - Urology     Major procedures and Radiology Reports - PLEASE review detailed and final reports for all details, in brief -  Ileal conduit urinary diversion   Dg Chest 2 View  Result Date: 08/31/2016 CLINICAL DATA:  Fever. Previous episodes of pneumonia. History of multiple scleroses. Thumb are smoker. EXAM: CHEST  2 VIEW COMPARISON:  Chest x-ray of July 29, 2016 FINDINGS: The lungs are well-expanded. There is no focal infiltrate. There is no pleural effusion. The interstitial markings of both lungs are coarse but stable. The heart and pulmonary vascularity are normal.  The mediastinum is normal in width. The bony thorax exhibits no acute abnormality. IMPRESSION: Mild chronic bronchitic changes, stable. No pneumonia, CHF, nor other acute cardiopulmonary abnormality. Electronically Signed   By: David  Martinique M.D.   On: 08/31/2016 08:40   Dg Abd 1 View  Result Date: 09/06/2016 CLINICAL DATA:   Abdominal distension EXAM: ABDOMEN - 1 VIEW COMPARISON:  09/05/2016 abdominal radiographs FINDINGS: Bilateral nephroureteral stents are stable in position. Additional surgical drain overlying the pelvis is stable in position. Skin staples overlie the left abdomen. Surgical clips overlie the bilateral deep pelvis. Mild diffuse gaseous distention of the small and large bowel, mildly increased. No evidence of pneumatosis or pneumoperitoneum. No radiopaque urolithiasis. IMPRESSION: Mild diffuse gaseous distention of the small and large bowel, mildly increased, most compatible with mild adynamic ileus in the postoperative setting. Electronically Signed   By: Ilona Sorrel M.D.   On: 09/06/2016 12:52   Ct Angio Chest Pe W Or Wo Contrast  Result Date: 08/31/2016 CLINICAL DATA:  Fever with positive D-dimer examination. Multiple sclerosis EXAM: CT ANGIOGRAPHY CHEST WITH CONTRAST TECHNIQUE: Multidetector CT imaging of the chest was performed using the standard protocol during bolus administration of intravenous contrast. Multiplanar CT image reconstructions and MIPs were obtained to evaluate the vascular anatomy. CONTRAST:  100 mL Isovue 370 nonionic COMPARISON:  Chest CT April 07, 2016; chest radiograph August 31, 2016 FINDINGS: Cardiovascular: There is no demonstrable pulmonary embolus. There is no thoracic aortic aneurysm or dissection. The visualized great vessels appear unremarkable. There is no appreciable pericardial thickening. Mediastinum/Nodes: Thyroid appears unremarkable. There is no evident thoracic adenopathy. Lungs/Pleura: There is scarring in the extreme lung apices. There is mild central peribronchial thickening. There is a small pleural effusion on each side. There is no edema or consolidation. There is slight there is atelectatic change in the lateral and posterior left base regions. There is suspected early pneumonia in the lateral left base. Upper Abdomen: Visualized upper abdominal structures appear  unremarkable. Musculoskeletal: There are no blastic or lytic bone lesions. Review of the MIP images confirms the above findings. IMPRESSION: 1.  No demonstrable pulmonary embolus. 2.  Atelectasis with probable early pneumonia lateral left base. 3. Mild central peribronchial thickening. Suspect chronic bronchitis. 4.  Small pleural effusions bilaterally. 5.  No demonstrable adenopathy. Electronically Signed   By: Lowella Grip III M.D.   On: 08/31/2016 09:06   Dg Abd 2 Views  Result Date: 09/05/2016 CLINICAL DATA:  Post op ureteral conduit surgery x 2 days ago, pt c/o intermittent pain, firm abd on palpation per chart. 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 Unable to raise LEFT arm for imaging in decubitus view. EXAM: ABDOMEN - 2 VIEW COMPARISON:  07/10/2016 bowel gas pattern is nonobstructive. Patient has bilateral ureterostomy tubes to ileal conduit in the right lower quadrant. Surgical clips overlie the abdomen. No free intraperitoneal air on the decubitus view of the abdomen. Chronic changes in both hips. No evidence for free intraperitoneal air. Small hiatal hernia noted. FINDINGS: The bowel gas pattern is normal. There is no evidence of free air. No radio-opaque calculi or other significant radiographic abnormality is seen. IMPRESSION: 1. Postoperative changes. 2. Ureteral stents. 3. Hiatal hernia. 4. Nonobstructive bowel gas pattern. Electronically Signed   By: Nolon Nations M.D.   On: 09/05/2016 13:36   US Abdomen Limited Ruq  Result Date: 08/31/2016 CLINICAL DATA:  Abnormal liver enzymes bowel EXAM: ULTRASOUND ABDOMEN LIMITED RIGHT UPPER QUADRANT COMPARISON:  None FINDINGS: Gallbladder: No  gallstones or wall thickening visualized. No sonographic Murphy sign noted by sonographer. Common bile duct: Diameter: Normal at 4 mm Liver: No focal lesion identified. Within normal limits in parenchymal echogenicity. IMPRESSION: Normal RIGHT upper quadrant ultrasound. Electronically  Signed   By: Suzy Bouchard M.D.   On: 08/31/2016 08:18    Micro Results     Recent Results (from the past 240 hour(s))  Culture, blood (routine x 2)     Status: None   Collection Time: 09/01/16 10:25 AM  Result Value Ref Range Status   Specimen Description BLOOD LEFT ARM  Final   Special Requests IN PEDIATRIC BOTTLE Blood Culture adequate volume  Final   Culture   Final    NO GROWTH 5 DAYS Performed at [facility], [place] [address], [place], [name] [address]    Report Status 09/06/2016 FINAL  Final  Culture, blood (routine x 2)     Status: None   Collection Time: 09/01/16 10:25 AM  Result Value Ref Range Status   Specimen Description BLOOD LEFT ARM  Final   Special Requests IN PEDIATRIC BOTTLE Blood Culture adequate volume  Final   Culture   Final    NO GROWTH 5 DAYS Performed at [facility], [place] [address], [place], [place] [address]    Report Status 09/06/2016 FINAL  Final       Today   Subjective:   Deanna Baldwin today Has any nausea, vomiting or abdominal pain, reports tolerating regular diet, she is excited about going back to staff today.   Objective:   Blood pressure (!) 96/59, pulse (!) 103, temperature 99.4 F (37.4 C), temperature source Oral, resp. rate 18, height 5\' 9"  (1.753 m), weight 39.2 kg (86 lb 6.7 oz), last menstrual period 01/28/2016, SpO2 100 %.   Intake/Output Summary (Last 24 hours) at 09/10/16 1135 Last data filed at 09/10/16 6283  Gross per 24 hour  Intake              320 ml  Output              970 ml  Net             -650 ml    Exam  General exam: Thin-appearing female, sitting in the bed, In no apparent distress Respiratory system: Clear to auscultation bilaterally, use of accessory muscles Cardiovascular system:S1 & S2 heard, RRR. No JVD, murmurs, rubs, gallops or clicks.  Gastrointestinal system: Abdomen is soft, nondistended, Midline surgical wound with staples present, wound healing nicely urostomy bag in  place, with normal color urine draining . Central nervous system: Alert and oriented. No focal neurological deficits. Skin: No rashes, lesions or ulcers Psychiatry: Judgement and insight appear normal. Mood & affect appropriate.   Data Review   CBC w Diff:  Lab Results  Component Value Date   WBC 5.6 09/07/2016   HGB 8.6 (L) 09/07/2016   HGB 9.7 (L) 06/24/2016   HCT 28.7 (L) 09/07/2016   HCT 30.9 (L) 06/24/2016   PLT 592 (H) 09/07/2016   LYMPHOPCT 18 09/07/2016   BANDSPCT 10 03/15/2016   MONOPCT 6 09/07/2016   EOSPCT 6 09/07/2016   BASOPCT 0 09/07/2016    CMP:  Lab Results  Component Value Date   NA 142 09/10/2016   NA 141 08/12/2016   K 3.5 09/10/2016   CL 106 09/10/2016   CO2 27 09/10/2016   BUN 9 09/10/2016   BUN 9 08/12/2016   CREATININE <0.30 (L)  09/10/2016   GLU 100 08/12/2016   PROT 5.4 (L) 09/09/2016   ALBUMIN 2.5 (L) 09/09/2016   BILITOT 0.3 09/09/2016   ALKPHOS 129 (H) 09/09/2016   AST 15 09/09/2016   ALT 13 (L) 09/09/2016  .   Total Time in preparing paper work, data evaluation and todays exam - 35 minutes  Yoland Scherr M.D on 09/10/2016 at 11:35 AM  [facility]   Office  [phone]

## 2016-09-10 NOTE — Progress Notes (Signed)
Report called to [place]  spoke to [place] LPN

## 2016-09-10 NOTE — Progress Notes (Signed)
D/c to [facility] via [place].

## 2016-09-10 NOTE — Care Management Note (Signed)
Case Management Note  Patient Details  Name: Deanna Baldwin MRN: [id number] Date of Birth: 06-11-1975  Subjective/Objective: d/c SNF-CSW already following for return back to [place].                   Action/Plan:d/c SNF.   Expected Discharge Date:   (unknown)               Expected Discharge Plan:  Skilled Nursing Facility  In-House Referral:  Clinical Social Work  Discharge planning Services  CM Consult  Post Acute Care Choice:    Choice offered to:     DME Arranged:    DME Agency:     HH Arranged:    [place] Agency:     Status of Service:  Completed, signed off  If discussed at [employer], dates discussed:    Additional Comments:  Dessa Phi, RN 09/10/2016, 11:11 AM

## 2016-09-10 NOTE — Consult Note (Signed)
WOC by to follow up with patient, pouch changed on Wednesday per [place] nurse.  Verified she has extra ostomy supplies/BSD bag adapters in the room and made sure that patient is aware to make sure supplies go with her back to SNF.   [place], [place], [place]

## 2016-09-11 LAB — FOLATE RBC
FOLATE, RBC: UNDETERMINED ng/mL
Folate, Hemolysate: 472.5 ng/mL

## 2016-09-13 ENCOUNTER — Encounter: Payer: Self-pay | Admitting: Adult Health

## 2016-09-13 ENCOUNTER — Non-Acute Institutional Stay (SKILLED_NURSING_FACILITY): Payer: Medicare Other | Admitting: Adult Health

## 2016-09-13 DIAGNOSIS — N319 Neuromuscular dysfunction of bladder, unspecified: Secondary | ICD-10-CM

## 2016-09-13 DIAGNOSIS — G35 Multiple sclerosis: Secondary | ICD-10-CM

## 2016-09-13 DIAGNOSIS — E43 Unspecified severe protein-calorie malnutrition: Secondary | ICD-10-CM

## 2016-09-13 DIAGNOSIS — D638 Anemia in other chronic diseases classified elsewhere: Secondary | ICD-10-CM

## 2016-09-13 DIAGNOSIS — G8220 Paraplegia, unspecified: Secondary | ICD-10-CM

## 2016-09-13 DIAGNOSIS — I959 Hypotension, unspecified: Secondary | ICD-10-CM | POA: Diagnosis not present

## 2016-09-13 DIAGNOSIS — L89154 Pressure ulcer of sacral region, stage 4: Secondary | ICD-10-CM | POA: Diagnosis not present

## 2016-09-13 NOTE — Progress Notes (Signed)
Location:   [facility] Room Number: 215 A Place of Service:  SNF (31)   CODE STATUS:  Full Code  No Known Allergies  Chief Complaint  Patient presents with  . Hospitalization Follow-up    Hospital follow up    HPI:  She is a long term resident of this facility who has been hospitalized to have an ileo conduit performed. She did growth staph hominis considered contaminant; had mrsa was treated her follow up culture was negative. She did develop a post op ileus due to her surgical procedure. She has chronic hypotension and has been started on mid low dose midodrine. She is slightly confused and is difficult to perform ros. There are no nursing concerns at this time.    Past Medical History:  Diagnosis Date  . Buttock wound 03/03/2016  . Dysrhythmia    tachycardia  . MS (multiple sclerosis) ([place])   . Neutropenia ([place])   . Pneumonia 02/2016  . Protein calorie malnutrition ([place])   . Severe sepsis ([place]) 03/03/2016  . Spastic 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 ([place])   . UTI (urinary tract infection) 02/2016    Past Surgical History:  Procedure Laterality Date  . DIVERTING ILEOSTOMY N/A 09/03/2016   Procedure: ILEAL CONDUIT  URINARY DIVERSION OPEN;  Surgeon: Ardis Hughs, MD;  Location: [facility];  Service: Urology;  Laterality: N/A;  . NO PAST SURGERIES      Social History   Social History  . Marital status: Single    Spouse name: N/A  . Number of children: N/A  . Years of education: N/A   Occupational History  . Disabled    Social History Main Topics  . Smoking status: Former Smoker    Packs/day: 1.00    Years: 10.00    Types: Cigarettes    Quit date: 02/01/2010  . Smokeless tobacco: Never Used  . Alcohol use No  . Drug use: No  . Sexual activity: Not on file   Other Topics Concern  . Not on file   Social History Narrative   She is a resident at [facility].   Right-handed.   Family History  Problem Relation Age of Onset   . Mental illness Sister       VITAL SIGNS BP 112/62   Pulse 84   Temp (!) 97.1 F (36.2 C)   Resp 17   Ht _0  (1.753 m)   Wt 86 lb 8 oz (39.2 kg)   LMP 01/28/2016   SpO2 97%   BMI 12.77 kg/m   Patient's Medications  New Prescriptions   No medications on file  Previous Medications   ACETAMINOPHEN (TYLENOL) 325 MG TABLET    Take 650 mg by mouth every 6 (six) hours as needed for mild pain.    AMINO ACIDS-PROTEIN HYDROLYS (FEEDING SUPPLEMENT, PRO-STAT SUGAR FREE 64,) LIQD    Take 30 mLs by mouth 2 (two) times daily.   BACLOFEN (LIORESAL) 20 MG TABLET    Take 20 mg by mouth 4 (four) times daily.   BISACODYL (DULCOLAX) 10 MG SUPPOSITORY    Place 1 suppository (10 mg total) rectally daily as needed for mild constipation or moderate constipation.   BISACODYL (DULCOLAX) 5 MG EC TABLET    Take 5 mg by mouth daily.   CIPROFLOXACIN (CIPRO) 500 MG TABLET    Take 1 tablet (500 mg total) by mouth 2 (two) times daily. Begin day before follow up appointment with urology. Please start on 09/20/2016.  CYANOCOBALAMIN (B-12 PO)    Take 1 tablet by mouth daily.   FERROUS SULFATE 325 (65 FE) MG TABLET    Take 1 tablet (325 mg total) by mouth 2 (two) times daily with a meal.   MIDODRINE (PROAMATINE) 2.5 MG TABLET    Take 1 tablet (2.5 mg total) by mouth 3 (three) times daily with meals.   MIRTAZAPINE (REMERON) 7.5 MG TABLET    Take 7.5 mg by mouth at bedtime.   MULTIPLE VITAMINS-MINERALS (DECUBI-VITE) CAPS    Take 1 capsule by mouth daily.   NUTRITIONAL SUPPLEMENTS (NUTRITIONAL SUPPLEMENT PO)    House  Supplement - Give 120cc by mouth three times daily   POLYETHYLENE GLYCOL (MIRALAX / GLYCOLAX) PACKET    Take 17 g by mouth daily.   VITAMIN C (ASCORBIC ACID) 500 MG TABLET    Take 500 mg by mouth 2 (two) times daily.  Modified Medications   No medications on file  Discontinued Medications   FEEDING SUPPLEMENT, ENSURE ENLIVE, (ENSURE ENLIVE) LIQD    Take 237 mLs by mouth 2 (two) times daily between  meals.     SIGNIFICANT DIAGNOSTIC EXAMS  PREVIOUS   03-04-16: swallow study: recommend diet of dysphagia 3 solids, honey thick liquids (no straw), meds whole in puree, and cough/clear throat after every few bites/sips  03-07-16: chest x-ray: Increase left lung opacity concerning for worsening pneumonia or atelectasis with associated pleural effusion.   03-07-16: mri of pelvis: 1. Cellulitis and diffuse myofasciitis involving the pelvic and hip musculature. There also or rim enhancing fluid collections in the gluteus maximus muscles bilaterally concerning for pyomyositis. 2. No findings to suggest septic arthritis or osteomyelitis. 3. Shallow sacral decubitus ulcer but no underlying sacral osteomyelitis. 4. Markedly distended bladder.  03-07-16: mri of lumbar: 1. Negative for lumbar spine infection or impingement. 2. There is non organized edema in the subcutaneous fat. Negative for abscess. 3. Urinary retention causing bilateral hydroureteronephrosis, also seen by CT in 2010.   03-12-16: bone marrow biopsy: Successful CT guided left iliac bone marrow aspiration and core biopsy. Note, an additional sample was set aside for Gram stain and culture analysis.  04-05-16: transvaginal and pelvic ultrasound: 1. Nonvisualization of the left ovary   2. Otherwise negative pelvic ultrasound  04-05-16: renal ultrasound: Mild right hydronephrosis.  04-05-16: chest x-ray: No evidence of pneumonia.  04-07-16: ct angio of chest: 1. No acute pulmonary embolus. 2. Tiny tree-in-bud densities in the right upper and lower lobes consistent with bronchiolitis. 3. 5 mm or less pulmonary nodular densities are also seen within the right upper and lower lobes possibly representing subpleural and perifissural lymph nodes. No follow-up needed if patient is low-risk (and has no known or suspected primary neoplasm). Non-contrast chest CT can be considered in 12 months if patient is high-risk.    06-03-16: TEE: - The patient was in  sinus tachycardia. Normal LV size with EF 45-50%, diffuse hypokinesis. Normal RV size with mildly decreased systolic function. No significant valvular abnormalities.  07-15-16: mri of brain: This MRI of the brain with and without contrast shows the following: 1.   Multiple infratentorial and hemispheric T2/FLAIR hyperintense foci consistent with chronic demyelinating plaque associated with multiple sclerosis. None of the foci appeared to be acute. 2.   Moderate cortical atrophy and corpus callosum atrophy with mild brainstem atrophy. 3.   There are no acute findings.  07-15-16: mri cervical spine: This MRI of the cervical spine without contrast shows the following: 1.   Multiple T2  hyperintense foci within the spinal cord as detailed above.   IV access was not obtainable for contrast. 2.   Mild disc bulges at C3-C4, C4-C5 and C5-C6 but do not lead to any nerve root compression or central canal narrowing.   TODAY:    08-31-16: tee: - Left ventricle: The cavity size was normal. Systolic function was normal. The estimated ejection fraction was in the range of 55% to 60%. Wall motion was normal; there were no regional wall motion abnormalities. Left ventricular diastolic function parameters were normal. - Mitral valve: Systolic bowing without prolapse  08-31-16: right upper quad ultrasound: Normal RIGHT upper quadrant ultrasound.  08-31-16: kub: Mild chronic bronchitic changes, stable. No pneumonia, CHF, nor other acute cardiopulmonary abnormality.   08-31-16: ct angio of chest: 1.  No demonstrable pulmonary embolus. 2.  Atelectasis with probable early pneumonia lateral left base. 3. Mild central peribronchial thickening. Suspect chronic bronchitis. 4.  Small pleural effusions bilaterally. 5.  No demonstrable adenopathy.   09-05-16: kub: 1. Postoperative changes. 2. Ureteral stents. 3. Hiatal hernia. 4. Nonobstructive bowel gas pattern.    LABS REVIEWED:PREVIOUS   03-02-16: wbc 13.2; hgb 11.7;  hct 37.8; mcv 92.0; plt 526; glucose 163; bun 12; creat 0.77; k+ 3.7; na++ 141; ast 113; alt 55; alk phos 118; total bili 1.7; albumin 2.2  03-05-16: wbc 2.9; hgb 8.8; hct 29.4; mcv 93.3; plt 360; glucose 110; bun 13; creat 0.87; k+ 3.5 ;na++ 158; mag 2.3 03-07-16: wbc 1.8; hgb 9.3; hct 31.0; mcv 92.3; plt 299; glucose 112; bun 9; creat 0.99; k+ 3.2 ;na++ 143; mag 2.0; urine culture: e-coli; blood cultures: neg; HIV nr 03-12-16: wbc 2.6; hgb 8.7; hct 29.0; mcv 89.8; plt 392; glucose 104; bun <5; creat 0.47; k+ 3.6; na++ 141 03-15-16: wbc 24.2; hgb 8.7; hct 28.9; mcv 90.6; plt 496; glucose 88; bun 6; creat 0.41; k+ 3.9; na++ 144 mag 1.8  03-19-16: wbc 12.3; hgb 10.7; hct 35.8; mcv 91.7; plt 513; glucose 112; bun 14.2; creat 0.40; k+ 4.7; na++149; alt 64; ast 73; albumin 3.2  04-05-16: wbc 10.9; hgb 10.3; hct 33.0; mcv 89.7; plt 632; glucose 92; bun 10; creat 0.58; k+ 3.7; na++ 141; mag 1.7; urine culture: citrobacter freundii:cipro  04-06-16: wbc 9.0; hgb 8.2; hct 26.7; mcv 91.1; plt 532; glucose 89; bun 9; creat 0.41; k+ 3.2; na++ 143; liver normal albumin 2.1 04-07-16: wbc 7.9 ;hgb 7.0; hct 22.8; mcv 90.5; plt 517;glucose 82; bun 6; creat 0.38; k+ 4.2; na++ 142; iron 14; tibc 174; ferritin 276; folate 10.1; vit B 12: 423; tsh 1.741; d-dimer 3.67  04-08-16: wbc 6.3; hgb 8.4; hct 27.1; mcv 84.4 ;plt 526; glucose 87; bun 6; creat 0.34; k+ 3.8; na++ 139  05-12-16: wbc 9.8; hgb 10.3; hct 32.6; mcv 90.7; plt 497; glucose 92; bun 11.2; creat 0.54; k+ 4.5; na++ 140; liver normal albumin 4.0;  06-23-16: wbc 12.1; hgb 9.4; hct 31.2; mcv 87.3; plt 629; glucose 150; bun 11.6; creat 0.22; k+ 3.9; na++ 139 alt 63; ast 44; alk phos 219; albumin 3.0; vit B 12: 941; folic acid 74.0; pre-albumin 9; tsh 2.28  07-28-16: wbc 9.1; hgb 11.4; hct 37.7; mcv 85.9; plt 715; glucose 111; bun 11; creat 0.35; k+ 4.4; na++ 141; ca 9.7 07-30-16: wbc 8.2; hgb 9.4; hct 31.2; mcv 84.3; plt 529; glucose 99; bun 5; creat <0.3; k+ 4.1; na++ 141; ca 9.1; alk  phos 143; albumin 2.6    TODAY:   08-30-16: wbc 12.2; hgb 12.1; hct 39.9; mcv  85.6; plt 603; glucose 106; bun 11; creat 0.40; k+ 4.7; na++ 143; ca 9.8; blood culture X2: MRSA 08-31-16: wbc 9.7; hgb 9.0; hct 29.4; mcv 83.8; plt 4457; glucose 147; bun 15; creat 0.42; k+ 3.8; na++ 138; ca 8.2; ast 60; alt 61; alk phos 134; albumin 2.1; Ddimer: 5.37;blood culture: staph hominis/MRSA 09-01-16: wbc 6.9; hgb 8.3; hct 26.9; mcv 84.3; plt 502; glucose 158; bun <5; creat <0.3; k+ 3.6; na++ 141; ca 8.7 blood culture: no growth  09-03-16: wbc 4.9; hgb 8.0; hct 26.0; mcv 84.7; plt 606; glucose 83; bun 6; creat <0.3; k+ 4.0; na++ 141; ca 9.1; liver normal albumin 3.1; mag 1.9  09-07-16: wbc 5.6; hgb 8.6; hct 28.7; mcv 85.4; plt 592; glucose 93; bun <5; creat <0.3; k+ 3.5; na++ 142; ca 8.7; liver normal albumin 2.6; pre-albumin 9.3; mag 1.8; phos 4.3; trig 138 09-08-16: iron 28; tibc 172; ferritin 205; vit B 12: 261; tsh 4.266 09-10-16: glucose  96; bun 9; creat <0.3; k+ 3.5; na++ 142; ca 8.9; mag 1.8; phos 3.2      Review of Systems  Constitutional: Negative for malaise/fatigue.  Respiratory: Negative for cough.   Cardiovascular: Negative for chest pain.  Gastrointestinal: Negative for heartburn.  Musculoskeletal: Negative for back pain and myalgias.  Skin: Negative.        Has sores   Neurological: Negative for dizziness.  Psychiatric/Behavioral: The patient is not nervous/anxious.     Physical Exam  Constitutional: No distress.  Frail malnourished   Eyes: Conjunctivae are normal.  Neck: Neck supple. No JVD present. No thyromegaly present.  Cardiovascular: Normal rate, regular rhythm and intact distal pulses.   Respiratory: Effort normal and breath sounds normal. No respiratory distress. She has no wheezes.  GI: Soft. Bowel sounds are normal. She exhibits no distension. There is no tenderness.  Has urostomy   Musculoskeletal: She exhibits no edema.  Able to move upper extremities Lower extremities  with contractures    Lymphadenopathy:    She has no cervical adenopathy.  Neurological: She is alert.  Skin: Skin is warm and dry. She is not diaphoretic.  Sacrum Stage III: 5 x 5 x 0.2 cm  Left ischial tuberosity: stage III: 5 x 3.5 x 0.4 cm  20 % yellow slough Left lateral foot: 6.5 x 2.5 x 04 cm 10 % slought Left lateral malleolus: 2 cm round Right medial heel: 0.4 round with 0.5 cm undermining  Left posterior heel: 3.5 x 3.5 cm DTI    Psychiatric: She has a normal mood and affect.    ASSESSMENT/ PLAN:  TODAY:   1. MS: there is no change in her status. She had been followed by neurology at [facility] (07-07-16). Is taking Rituximab every 6 months . She is allergic to Tysabri;   2.  Spastic paraplegia: is stable:  will continue baclofen 10 mg three times daily no splints on at this time.   3. Sacral decubitus stage IV: no change in his status: has multiple ulcerations:  will continue treatment per facility protocol; will be followed by wound Dr.  4. Neurogenic bladder; is status post ileo-conduit is due for urology follow up on 09-21-16: will begin cipro 500 mg twice daily on the day before appointment.    5. Protein calorie malnutrition: severe:  no change in her status: her current weight in 86 pounds; she has gained 3 pounds. Her pre-albumin is 9.3. Is taking prostat and supplements per facility protocol. Is taking remeron 7.5. Mg nightly  6. Constipation: will continue miralax daily and dulcolax 5 mg tab daily and has dulcolax 10 mg suppository daily as needed.    7. Anemia: no change in status:  Likely related to chronic disease and iron deficiency: is status post transfusion 04/2016: hgb is 8.6; will continue iron twice daily and vit B 12 daily     8. Hypotension: is chronic and is stable: b/p: 112/62: will continue midodrine 2.5 mg three times daily  9. Sepsis due to UTI: has completed abt; will monitor her status.   Will check cbc cmp    Ok Edwards NP [facility]  Adult Medicine  Contact [phone] Monday through Friday 8am- 5pm  After hours call [phone]

## 2016-09-14 ENCOUNTER — Non-Acute Institutional Stay (SKILLED_NURSING_FACILITY): Payer: Medicare Other | Admitting: Adult Health

## 2016-09-14 ENCOUNTER — Encounter: Payer: Self-pay | Admitting: Adult Health

## 2016-09-14 DIAGNOSIS — R6520 Severe sepsis without septic shock: Secondary | ICD-10-CM | POA: Diagnosis not present

## 2016-09-14 DIAGNOSIS — R634 Abnormal weight loss: Secondary | ICD-10-CM

## 2016-09-14 DIAGNOSIS — E43 Unspecified severe protein-calorie malnutrition: Secondary | ICD-10-CM | POA: Diagnosis not present

## 2016-09-14 DIAGNOSIS — D709 Neutropenia, unspecified: Secondary | ICD-10-CM | POA: Diagnosis not present

## 2016-09-14 DIAGNOSIS — E876 Hypokalemia: Secondary | ICD-10-CM | POA: Diagnosis not present

## 2016-09-14 DIAGNOSIS — M791 Myalgia: Secondary | ICD-10-CM | POA: Diagnosis not present

## 2016-09-14 LAB — BASIC METABOLIC PANEL
BUN: 11 (ref 4–21)
Creatinine: 0.4 — AB (ref 0.5–1.1)
GLUCOSE: 102
Potassium: 3.8 (ref 3.4–5.3)
SODIUM: 144 (ref 137–147)

## 2016-09-14 LAB — CBC AND DIFFERENTIAL
HEMATOCRIT: 35 — AB (ref 36–46)
HEMOGLOBIN: 10.7 — AB (ref 12.0–16.0)
NEUTROS ABS: 13
Platelets: 601 — AB (ref 150–399)
WBC: 15.3

## 2016-09-14 LAB — HEPATIC FUNCTION PANEL
ALT: 12 (ref 7–35)
AST: 14 (ref 13–35)
Alkaline Phosphatase: 141 — AB (ref 25–125)
BILIRUBIN, TOTAL: 0.3

## 2016-09-14 NOTE — Progress Notes (Signed)
Location:   [facility] Room Number: 215 A she Place of Service:  SNF (31)   CODE STATUS: Full Code  No Known Allergies  Chief Complaint  Patient presents with  . Acute Visit    Weight loss    HPI:  She has lost weight from 86.5 pounds on 09-12-16 her weight on 09-13-16: 80.5 pounds. 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 While hospitalized she also suffered a post op ileus; where she was npo then clear liquids. She is now on solid foods. Staff reports that she is eating. She has suffered poor caloric intake during her hospitalization.   Past Medical History:  Diagnosis Date  . Buttock wound 03/03/2016  . Dysrhythmia    tachycardia  . MS (multiple sclerosis) ([place])   . Neutropenia ([place])   . Pneumonia 02/2016  . Protein calorie malnutrition ([place])   . Severe sepsis ([place]) 03/03/2016  . 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 UTI (urinary tract infection) 02/2016    Past Surgical History:  Procedure Laterality Date  . DIVERTING ILEOSTOMY N/A 09/03/2016   Procedure: ILEAL CONDUIT  URINARY DIVERSION OPEN;  Surgeon: Ardis Hughs, MD;  Location: [facility]S;  Service: Urology;  Laterality: N/A;  . NO PAST SURGERIES      Social History   Social History  . Marital status: Single    Spouse name: N/A  . Number of children: N/A  . Years of education: N/A   Occupational History  . Disabled    Social History Main Topics  . Smoking status: Former Smoker    Packs/day: 1.00    Years: 10.00    Types: Cigarettes    Quit date: 02/01/2010  . Smokeless tobacco: Never Used  . Alcohol use No  . Drug use: No  . Sexual activity: Not on file   Other Topics Concern  . Not on file   Social History Narrative   She is a resident at [facility].   Right-handed.   Family History  Problem Relation Age of Onset  . Mental illness Sister       VITAL SIGNS BP 112/62   Pulse 84   Temp (!) 97.1 F (36.2 C)   Resp 17   Ht '5\' 9"'   (1.753 m)   Wt 80 lb 8 oz (36.5 kg)   LMP 01/28/2016   SpO2 97%   BMI 11.89 kg/m   Patient's Medications  New Prescriptions   No medications on file  Previous Medications   ACETAMINOPHEN (TYLENOL) 325 MG TABLET    Take 650 mg by mouth every 6 (six) hours as needed for mild pain.    AMINO ACIDS-PROTEIN HYDROLYS (FEEDING SUPPLEMENT, PRO-STAT SUGAR FREE 64,) LIQD    Take 30 mLs by mouth 2 (two) times daily.   BACLOFEN (LIORESAL) 20 MG TABLET    Take 20 mg by mouth 4 (four) times daily.   BISACODYL (DULCOLAX) 10 MG SUPPOSITORY    Place 1 suppository (10 mg total) rectally daily as needed for mild constipation or moderate constipation.   BISACODYL (DULCOLAX) 5 MG EC TABLET    Take 5 mg by mouth daily.   CIPROFLOXACIN (CIPRO) 500 MG TABLET    Take 1 tablet (500 mg total) by mouth 2 (two) times daily. Begin day before follow up appointment with urology. Please start on 09/20/2016.   CYANOCOBALAMIN (B-12 PO)    Take 1 tablet by mouth daily.   FERROUS SULFATE 325 (65 FE)  MG TABLET    Take 1 tablet (325 mg total) by mouth 2 (two) times daily with a meal.   MIDODRINE (PROAMATINE) 2.5 MG TABLET    Take 1 tablet (2.5 mg total) by mouth 3 (three) times daily with meals.   MIRTAZAPINE (REMERON) 7.5 MG TABLET    Take 7.5 mg by mouth at bedtime.   MULTIPLE VITAMINS-MINERALS (DECUBI-VITE) CAPS    Take 1 capsule by mouth daily.   NUTRITIONAL SUPPLEMENTS (NUTRITIONAL SUPPLEMENT PO)    House  Supplement - Give 120cc by mouth three times daily   POLYETHYLENE GLYCOL (MIRALAX / GLYCOLAX) PACKET    Take 17 g by mouth daily.   VITAMIN C (ASCORBIC ACID) 500 MG TABLET    Take 500 mg by mouth 2 (two) times daily.  Modified Medications   No medications on file  Discontinued Medications   No medications on file     SIGNIFICANT DIAGNOSTIC EXAMS  PREVIOUS   03-04-16: swallow study: recommend diet of dysphagia 3 solids, honey thick liquids (no straw), meds whole in puree, and cough/clear throat after every few  bites/sips  03-07-16: chest x-ray: Increase left lung opacity concerning for worsening pneumonia or atelectasis with associated pleural effusion.   03-07-16: mri of pelvis: 1. Cellulitis and diffuse myofasciitis involving the pelvic and hip musculature. There also or rim enhancing fluid collections in the gluteus maximus muscles bilaterally concerning for pyomyositis. 2. No findings to suggest septic arthritis or osteomyelitis. 3. Shallow sacral decubitus ulcer but no underlying sacral osteomyelitis. 4. Markedly distended bladder.  03-07-16: mri of lumbar: 1. Negative for lumbar spine infection or impingement. 2. There is non organized edema in the subcutaneous fat. Negative for abscess. 3. Urinary retention causing bilateral hydroureteronephrosis, also seen by CT in 2010.   03-12-16: bone marrow biopsy: Successful CT guided left iliac bone marrow aspiration and core biopsy. Note, an additional sample was set aside for Gram stain and culture analysis.  04-05-16: transvaginal and pelvic ultrasound: 1. Nonvisualization of the left ovary   2. Otherwise negative pelvic ultrasound  04-05-16: renal ultrasound: Mild right hydronephrosis.  04-05-16: chest x-ray: No evidence of pneumonia.  04-07-16: ct angio of chest: 1. No acute pulmonary embolus. 2. Tiny tree-in-bud densities in the right upper and lower lobes consistent with bronchiolitis. 3. 5 mm or less pulmonary nodular densities are also seen within the right upper and lower lobes possibly representing subpleural and perifissural lymph nodes. No follow-up needed if patient is low-risk (and has no known or suspected primary neoplasm). Non-contrast chest CT can be considered in 12 months if patient is high-risk.    06-03-16: TEE: - The patient was in sinus tachycardia. Normal LV size with EF 45-50%, diffuse hypokinesis. Normal RV size with mildly decreased systolic function. No significant valvular abnormalities.  07-15-16: mri of brain: This MRI of the brain  with and without contrast shows the following: 1.   Multiple infratentorial and hemispheric T2/FLAIR hyperintense foci consistent with chronic demyelinating plaque associated with multiple sclerosis. None of the foci appeared to be acute. 2.   Moderate cortical atrophy and corpus callosum atrophy with mild brainstem atrophy. 3.   There are no acute findings.  07-15-16: mri cervical spine: This MRI of the cervical spine without contrast shows the following: 1.   Multiple T2 hyperintense foci within the spinal cord as detailed above.   IV access was not obtainable for contrast. 2.   Mild disc bulges at C3-C4, C4-C5 and C5-C6 but do not lead to any nerve  root compression or central canal narrowing.    08-31-16: tee: - Left ventricle: The cavity size was normal. Systolic function was normal. The estimated ejection fraction was in the range of 55% to 60%. Wall motion was normal; there were no regional wall motion abnormalities. Left ventricular diastolic function parameters were normal. - Mitral valve: Systolic bowing without prolapse  08-31-16: right upper quad ultrasound: Normal RIGHT upper quadrant ultrasound.  08-31-16: kub: Mild chronic bronchitic changes, stable. No pneumonia, CHF, nor other acute cardiopulmonary abnormality.   08-31-16: ct angio of chest: 1.  No demonstrable pulmonary embolus. 2.  Atelectasis with probable early pneumonia lateral left base. 3. Mild central peribronchial thickening. Suspect chronic bronchitis. 4.  Small pleural effusions bilaterally. 5.  No demonstrable adenopathy.   09-05-16: kub: 1. Postoperative changes. 2. Ureteral stents. 3. Hiatal hernia. 4. Nonobstructive bowel gas pattern.  NO NEW EXAMS     LABS REVIEWED:PREVIOUS   03-02-16: wbc 13.2; hgb 11.7; hct 37.8; mcv 92.0; plt 526; glucose 163; bun 12; creat 0.77; k+ 3.7; na++ 141; ast 113; alt 55; alk phos 118; total bili 1.7; albumin 2.2  03-05-16: wbc 2.9; hgb 8.8; hct 29.4; mcv 93.3; plt 360; glucose 110;  bun 13; creat 0.87; k+ 3.5 ;na++ 158; mag 2.3 03-07-16: wbc 1.8; hgb 9.3; hct 31.0; mcv 92.3; plt 299; glucose 112; bun 9; creat 0.99; k+ 3.2 ;na++ 143; mag 2.0; urine culture: e-coli; blood cultures: neg; HIV nr 03-12-16: wbc 2.6; hgb 8.7; hct 29.0; mcv 89.8; plt 392; glucose 104; bun <5; creat 0.47; k+ 3.6; na++ 141 03-15-16: wbc 24.2; hgb 8.7; hct 28.9; mcv 90.6; plt 496; glucose 88; bun 6; creat 0.41; k+ 3.9; na++ 144 mag 1.8  03-19-16: wbc 12.3; hgb 10.7; hct 35.8; mcv 91.7; plt 513; glucose 112; bun 14.2; creat 0.40; k+ 4.7; na++149; alt 64; ast 73; albumin 3.2  04-05-16: wbc 10.9; hgb 10.3; hct 33.0; mcv 89.7; plt 632; glucose 92; bun 10; creat 0.58; k+ 3.7; na++ 141; mag 1.7; urine culture: citrobacter freundii:cipro  04-06-16: wbc 9.0; hgb 8.2; hct 26.7; mcv 91.1; plt 532; glucose 89; bun 9; creat 0.41; k+ 3.2; na++ 143; liver normal albumin 2.1 04-07-16: wbc 7.9 ;hgb 7.0; hct 22.8; mcv 90.5; plt 517;glucose 82; bun 6; creat 0.38; k+ 4.2; na++ 142; iron 14; tibc 174; ferritin 276; folate 10.1; vit B 12: 423; tsh 1.741; d-dimer 3.67  04-08-16: wbc 6.3; hgb 8.4; hct 27.1; mcv 84.4 ;plt 526; glucose 87; bun 6; creat 0.34; k+ 3.8; na++ 139  05-12-16: wbc 9.8; hgb 10.3; hct 32.6; mcv 90.7; plt 497; glucose 92; bun 11.2; creat 0.54; k+ 4.5; na++ 140; liver normal albumin 4.0;  06-23-16: wbc 12.1; hgb 9.4; hct 31.2; mcv 87.3; plt 629; glucose 150; bun 11.6; creat 0.22; k+ 3.9; na++ 139 alt 63; ast 44; alk phos 219; albumin 3.0; vit B 12: 294; folic acid 76.5; pre-albumin 9; tsh 2.28  07-28-16: wbc 9.1; hgb 11.4; hct 37.7; mcv 85.9; plt 715; glucose 111; bun 11; creat 0.35; k+ 4.4; na++ 141; ca 9.7 07-30-16: wbc 8.2; hgb 9.4; hct 31.2; mcv 84.3; plt 529; glucose 99; bun 5; creat <0.3; k+ 4.1; na++ 141; ca 9.1; alk phos 143; albumin 2.6  08-30-16: wbc 12.2; hgb 12.1; hct 39.9; mcv 85.6; plt 603; glucose 106; bun 11; creat 0.40; k+ 4.7; na++ 143; ca 9.8; blood culture X2: MRSA 08-31-16: wbc 9.7; hgb 9.0; hct 29.4; mcv  83.8; plt 4457; glucose 147; bun 15; creat 0.42; k+ 3.8; na++  138; ca 8.2; ast 60; alt 61; alk phos 134; albumin 2.1; Ddimer: 5.37;blood culture: staph hominis/MRSA 09-01-16: wbc 6.9; hgb 8.3; hct 26.9; mcv 84.3; plt 502; glucose 158; bun <5; creat <0.3; k+ 3.6; na++ 141; ca 8.7 blood culture: no growth  09-03-16: wbc 4.9; hgb 8.0; hct 26.0; mcv 84.7; plt 606; glucose 83; bun 6; creat <0.3; k+ 4.0; na++ 141; ca 9.1; liver normal albumin 3.1; mag 1.9  09-07-16: wbc 5.6; hgb 8.6; hct 28.7; mcv 85.4; plt 592; glucose 93; bun <5; creat <0.3; k+ 3.5; na++ 142; ca 8.7; liver normal albumin 2.6; pre-albumin 9.3; mag 1.8; phos 4.3; trig 138 09-08-16: iron 28; tibc 172; ferritin 205; vit B 12: 261; tsh 4.266 09-10-16: glucose  96; bun 9; creat <0.3; k+ 3.5; na++ 142; ca 8.9; mag 1.8; phos 3.2   NO NEW EXAMS    Review of Systems  Reason unable to perform ROS: difficult to answer questions; is slighlty confused   Constitutional: Negative for malaise/fatigue.  Respiratory: Negative for cough.   Cardiovascular: Negative for chest pain.  Gastrointestinal: Negative for abdominal pain.  Musculoskeletal: Negative for back pain and myalgias.  Skin: Negative.        Has sores   Psychiatric/Behavioral: The patient is not nervous/anxious.       Physical Exam  Constitutional: No distress.  Frail malnourished   Eyes: Conjunctivae are normal.  Neck: Neck supple. No JVD present. No thyromegaly present.  Cardiovascular: Normal rate, regular rhythm and intact distal pulses.   Respiratory: Effort normal and breath sounds normal. No respiratory distress. She has no wheezes.  GI: Soft. Bowel sounds are normal. She exhibits no distension. There is no tenderness.  Has urostomy   Musculoskeletal: She exhibits no edema.  Able to move upper  extremities  Has lower extremity contractures   Lymphadenopathy:    She has no cervical adenopathy.  Neurological: She is alert.  Skin: Skin is warm and dry. She is not diaphoretic.    Sacrum Stage III: 5 x 5 x 0.2 cm  Left ischial tuberosity: stage III: 5 x 3.5 x 0.4 cm  20 % yellow slough Left lateral foot: 6.5 x 2.5 x 04 cm 10 % slought Left lateral malleolus: 2 cm round Right medial heel: 0.4 round with 0.5 cm undermining  Left posterior heel: 3.5 x 3.5 cm DTI   Psychiatric: She has a normal mood and affect.     ASSESSMENT/ PLAN:  TODAY:   1. weight loss/malnutrition: at this time will note make any further medication changes. Will continue supplements as directed. My hope is that her weight will recover as she recovers from her surgery.     MD is aware of resident's narcotic use and is in agreement with current plan of care. We will attempt to wean resident as apropriate    Ok Edwards NP [facility]  Contact [phone] Monday through Friday 8am- 5pm  After hours call [phone]

## 2016-09-15 ENCOUNTER — Encounter: Payer: Self-pay | Admitting: Adult Health

## 2016-09-15 ENCOUNTER — Non-Acute Institutional Stay (SKILLED_NURSING_FACILITY): Payer: Medicare Other | Admitting: Adult Health

## 2016-09-15 DIAGNOSIS — G114 Hereditary spastic paraplegia: Secondary | ICD-10-CM | POA: Diagnosis not present

## 2016-09-15 DIAGNOSIS — D72829 Elevated white blood cell count, unspecified: Secondary | ICD-10-CM | POA: Diagnosis not present

## 2016-09-15 DIAGNOSIS — A419 Sepsis, unspecified organism: Secondary | ICD-10-CM | POA: Diagnosis not present

## 2016-09-15 DIAGNOSIS — R140 Abdominal distension (gaseous): Secondary | ICD-10-CM | POA: Diagnosis not present

## 2016-09-15 NOTE — Progress Notes (Signed)
.  Location:   [facility] Room Number: 215 A Place of Service:  SNF (31)   CODE STATUS: Full Code  No Known Allergies  Chief Complaint  Patient presents with  . Acute Visit    Elevated WBC    HPI:  Her cbc has come back at 15.3. There are no reports of fevers present. She does have ulcerations and has had a recent surgery. There is concern from the nursing staff that she may have another ileus. I have spoken with Dr. [name] about her blood work and recommends a urine culture and blood cultures. She is unable to fully participate in the hpi or ros.    Past Medical History:  Diagnosis Date  . Buttock wound 03/03/2016  . Dysrhythmia    tachycardia  . MS (multiple sclerosis) ([place])   . Neutropenia ([place])   . Pneumonia 02/2016  . Protein calorie malnutrition ([place])   . Severe sepsis ([place]) 03/03/2016  . 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 UTI (urinary tract infection) 02/2016    Past Surgical History:  Procedure Laterality Date  . DIVERTING ILEOSTOMY N/A 09/03/2016   Procedure: ILEAL CONDUIT  URINARY DIVERSION OPEN;  Surgeon: Ardis Hughs, MD;  Location: [facility]S;  Service: Urology;  Laterality: N/A;  . NO PAST SURGERIES      Social History   Social History  . Marital status: Single    Spouse name: N/A  . Number of children: N/A  . Years of education: N/A   Occupational History  . Disabled    Social History Main Topics  . Smoking status: Former Smoker    Packs/day: 1.00    Years: 10.00    Types: Cigarettes    Quit date: 02/01/2010  . Smokeless tobacco: Never Used  . Alcohol use No  . Drug use: No  . Sexual activity: Not on file   Other Topics Concern  . Not on file   Social History Narrative   She is a resident at [facility].   Right-handed.   Family History  Problem Relation Age of Onset  . Mental illness Sister       VITAL SIGNS BP 112/62   Pulse 84   Temp (!) 97.1 F (36.2 C)   Resp 17    Ht _0  (1.753 m)   Wt 80 lb 8 oz (36.5 kg)   LMP 01/28/2016   SpO2 97%   BMI 11.89 kg/m    Patient's Medications  New Prescriptions   No medications on file  Previous Medications   ACETAMINOPHEN (TYLENOL) 325 MG TABLET    Take 650 mg by mouth every 6 (six) hours as needed for mild pain.    AMINO ACIDS-PROTEIN HYDROLYS (FEEDING SUPPLEMENT, PRO-STAT SUGAR FREE 64,) LIQD    Take 30 mLs by mouth 2 (two) times daily.   BACLOFEN (LIORESAL) 20 MG TABLET    Take 20 mg by mouth 4 (four) times daily.   BISACODYL (DULCOLAX) 10 MG SUPPOSITORY    Place 1 suppository (10 mg total) rectally daily as needed for mild constipation or moderate constipation.   BISACODYL (DULCOLAX) 5 MG EC TABLET    Take 5 mg by mouth daily.   CIPROFLOXACIN (CIPRO) 500 MG TABLET    Take 1 tablet (500 mg total) by mouth 2 (two) times daily. Begin day before follow up appointment with urology. Please start on 09/20/2016.   CYANOCOBALAMIN (B-12 PO)    Take 1 tablet by mouth  daily.   FERROUS SULFATE 325 (65 FE) MG TABLET    Take 1 tablet (325 mg total) by mouth 2 (two) times daily with a meal.   MIDODRINE (PROAMATINE) 2.5 MG TABLET    Take 1 tablet (2.5 mg total) by mouth 3 (three) times daily with meals.   MIRTAZAPINE (REMERON) 7.5 MG TABLET    Take 7.5 mg by mouth at bedtime.   MULTIPLE VITAMINS-MINERALS (DECUBI-VITE) CAPS    Take 1 capsule by mouth daily.   NUTRITIONAL SUPPLEMENTS (NUTRITIONAL SUPPLEMENT PO)    House  Supplement - Give 120cc by mouth three times daily   POLYETHYLENE GLYCOL (MIRALAX / GLYCOLAX) PACKET    Take 17 g by mouth daily.   VITAMIN C (ASCORBIC ACID) 500 MG TABLET    Take 500 mg by mouth 2 (two) times daily.  Modified Medications   No medications on file  Discontinued Medications   No medications on file     SIGNIFICANT DIAGNOSTIC EXAMS   PREVIOUS   03-04-16: swallow study: recommend diet of dysphagia 3 solids, honey thick liquids (no straw), meds whole in puree, and cough/clear throat after  every few bites/sips  03-07-16: chest x-ray: Increase left lung opacity concerning for worsening pneumonia or atelectasis with associated pleural effusion.   03-07-16: mri of pelvis: 1. Cellulitis and diffuse myofasciitis involving the pelvic and hip musculature. There also or rim enhancing fluid collections in the gluteus maximus muscles bilaterally concerning for pyomyositis. 2. No findings to suggest septic arthritis or osteomyelitis. 3. Shallow sacral decubitus ulcer but no underlying sacral osteomyelitis. 4. Markedly distended bladder.  03-07-16: mri of lumbar: 1. Negative for lumbar spine infection or impingement. 2. There is non organized edema in the subcutaneous fat. Negative for abscess. 3. Urinary retention causing bilateral hydroureteronephrosis, also seen by CT in 2010.   03-12-16: bone marrow biopsy: Successful CT guided left iliac bone marrow aspiration and core biopsy. Note, an additional sample was set aside for Gram stain and culture analysis.  04-05-16: transvaginal and pelvic ultrasound: 1. Nonvisualization of the left ovary   2. Otherwise negative pelvic ultrasound  04-05-16: renal ultrasound: Mild right hydronephrosis.  04-05-16: chest x-ray: No evidence of pneumonia.  04-07-16: ct angio of chest: 1. No acute pulmonary embolus. 2. Tiny tree-in-bud densities in the right upper and lower lobes consistent with bronchiolitis. 3. 5 mm or less pulmonary nodular densities are also seen within the right upper and lower lobes possibly representing subpleural and perifissural lymph nodes. No follow-up needed if patient is low-risk (and has no known or suspected primary neoplasm). Non-contrast chest CT can be considered in 12 months if patient is high-risk.    06-03-16: TEE: - The patient was in sinus tachycardia. Normal LV size with EF 45-50%, diffuse hypokinesis. Normal RV size with mildly decreased systolic function. No significant valvular abnormalities.  07-15-16: mri of brain: This MRI of  the brain with and without contrast shows the following: 1.   Multiple infratentorial and hemispheric T2/FLAIR hyperintense foci consistent with chronic demyelinating plaque associated with multiple sclerosis. None of the foci appeared to be acute. 2.   Moderate cortical atrophy and corpus callosum atrophy with mild brainstem atrophy. 3.   There are no acute findings.  07-15-16: mri cervical spine: This MRI of the cervical spine without contrast shows the following: 1.   Multiple T2 hyperintense foci within the spinal cord as detailed above.   IV access was not obtainable for contrast. 2.   Mild disc bulges at C3-C4, C4-C5  and C5-C6 but do not lead to any nerve root compression or central canal narrowing.    08-31-16: tee: - Left ventricle: The cavity size was normal. Systolic function was normal. The estimated ejection fraction was in the range of 55% to 60%. Wall motion was normal; there were no regional wall motion abnormalities. Left ventricular diastolic function parameters were normal. - Mitral valve: Systolic bowing without prolapse  08-31-16: right upper quad ultrasound: Normal RIGHT upper quadrant ultrasound.  08-31-16: kub: Mild chronic bronchitic changes, stable. No pneumonia, CHF, nor other acute cardiopulmonary abnormality.   08-31-16: ct angio of chest: 1.  No demonstrable pulmonary embolus. 2.  Atelectasis with probable early pneumonia lateral left base. 3. Mild central peribronchial thickening. Suspect chronic bronchitis. 4.  Small pleural effusions bilaterally. 5.  No demonstrable adenopathy.   09-05-16: kub: 1. Postoperative changes. 2. Ureteral stents. 3. Hiatal hernia. 4. Nonobstructive bowel gas pattern.  NO NEW EXAMS     LABS REVIEWED:PREVIOUS   03-02-16: wbc 13.2; hgb 11.7; hct 37.8; mcv 92.0; plt 526; glucose 163; bun 12; creat 0.77; k+ 3.7; na++ 141; ast 113; alt 55; alk phos 118; total bili 1.7; albumin 2.2  03-05-16: wbc 2.9; hgb 8.8; hct 29.4; mcv 93.3; plt 360;  glucose 110; bun 13; creat 0.87; k+ 3.5 ;na++ 158; mag 2.3 03-07-16: wbc 1.8; hgb 9.3; hct 31.0; mcv 92.3; plt 299; glucose 112; bun 9; creat 0.99; k+ 3.2 ;na++ 143; mag 2.0; urine culture: e-coli; blood cultures: neg; HIV nr 03-12-16: wbc 2.6; hgb 8.7; hct 29.0; mcv 89.8; plt 392; glucose 104; bun <5; creat 0.47; k+ 3.6; na++ 141 03-15-16: wbc 24.2; hgb 8.7; hct 28.9; mcv 90.6; plt 496; glucose 88; bun 6; creat 0.41; k+ 3.9; na++ 144 mag 1.8  03-19-16: wbc 12.3; hgb 10.7; hct 35.8; mcv 91.7; plt 513; glucose 112; bun 14.2; creat 0.40; k+ 4.7; na++149; alt 64; ast 73; albumin 3.2  04-05-16: wbc 10.9; hgb 10.3; hct 33.0; mcv 89.7; plt 632; glucose 92; bun 10; creat 0.58; k+ 3.7; na++ 141; mag 1.7; urine culture: citrobacter freundii:cipro  04-06-16: wbc 9.0; hgb 8.2; hct 26.7; mcv 91.1; plt 532; glucose 89; bun 9; creat 0.41; k+ 3.2; na++ 143; liver normal albumin 2.1 04-07-16: wbc 7.9 ;hgb 7.0; hct 22.8; mcv 90.5; plt 517;glucose 82; bun 6; creat 0.38; k+ 4.2; na++ 142; iron 14; tibc 174; ferritin 276; folate 10.1; vit B 12: 423; tsh 1.741; d-dimer 3.67  04-08-16: wbc 6.3; hgb 8.4; hct 27.1; mcv 84.4 ;plt 526; glucose 87; bun 6; creat 0.34; k+ 3.8; na++ 139  05-12-16: wbc 9.8; hgb 10.3; hct 32.6; mcv 90.7; plt 497; glucose 92; bun 11.2; creat 0.54; k+ 4.5; na++ 140; liver normal albumin 4.0;  06-23-16: wbc 12.1; hgb 9.4; hct 31.2; mcv 87.3; plt 629; glucose 150; bun 11.6; creat 0.22; k+ 3.9; na++ 139 alt 63; ast 44; alk phos 219; albumin 3.0; vit B 12: 761; folic acid 95.0; pre-albumin 9; tsh 2.28  07-28-16: wbc 9.1; hgb 11.4; hct 37.7; mcv 85.9; plt 715; glucose 111; bun 11; creat 0.35; k+ 4.4; na++ 141; ca 9.7 07-30-16: wbc 8.2; hgb 9.4; hct 31.2; mcv 84.3; plt 529; glucose 99; bun 5; creat <0.3; k+ 4.1; na++ 141; ca 9.1; alk phos 143; albumin 2.6  08-30-16: wbc 12.2; hgb 12.1; hct 39.9; mcv 85.6; plt 603; glucose 106; bun 11; creat 0.40; k+ 4.7; na++ 143; ca 9.8; blood culture X2: MRSA 08-31-16: wbc 9.7; hgb 9.0; hct  29.4; mcv 83.8; plt 4457;  glucose 147; bun 15; creat 0.42; k+ 3.8; na++ 138; ca 8.2; ast 60; alt 61; alk phos 134; albumin 2.1; Ddimer: 5.37;blood culture: staph hominis/MRSA 09-01-16: wbc 6.9; hgb 8.3; hct 26.9; mcv 84.3; plt 502; glucose 158; bun <5; creat <0.3; k+ 3.6; na++ 141; ca 8.7 blood culture: no growth  09-03-16: wbc 4.9; hgb 8.0; hct 26.0; mcv 84.7; plt 606; glucose 83; bun 6; creat <0.3; k+ 4.0; na++ 141; ca 9.1; liver normal albumin 3.1; mag 1.9  09-07-16: wbc 5.6; hgb 8.6; hct 28.7; mcv 85.4; plt 592; glucose 93; bun <5; creat <0.3; k+ 3.5; na++ 142; ca 8.7; liver normal albumin 2.6; pre-albumin 9.3; mag 1.8; phos 4.3; trig 138 09-08-16: iron 28; tibc 172; ferritin 205; vit B 12: 261; tsh 4.266 09-10-16: glucose  96; bun 9; creat <0.3; k+ 3.5; na++ 142; ca 8.9; mag 1.8; phos 3.2   TODAY:  09-14-16: wbc 15.3; hgb 10.7; hct 34.9; mcv 88.2 plt 601; glucose 102; bun 10.6; creat 0.35; k+ 3.8; na++ 144; liver normal albumin 3.4    Review of Systems  Unable to perform ROS: Other (unable to fully answer questions; is slightly confused )    Physical Exam  Constitutional: No distress.  Frail mannourished  Eyes: Conjunctivae are normal.  Neck: Neck supple. No JVD present. No thyromegaly present.  Cardiovascular: Normal rate, regular rhythm and intact distal pulses.   Respiratory: Effort normal and breath sounds normal. No respiratory distress. She has no wheezes.  GI: Soft. Bowel sounds are normal. She exhibits no distension. There is no tenderness.  Has urostomy   Musculoskeletal: She exhibits no edema.  Able to move upper extremities  Lower extremities with contractures  Lymphadenopathy:    She has no cervical adenopathy.  Neurological: She is alert.  Skin: Skin is warm and dry. She is not diaphoretic.  Sacrum Stage III: 5 x 5 x 0.2 cm  Left ischial tuberosity: stage III: 5 x 3.5 x 0.4 cm  20 % yellow slough Left lateral foot: 6.5 x 2.5 x 04 cm 10 % slought Left lateral malleolus: 2 cm  round Right medial heel: 0.4 round with 0.5 cm undermining  Left posterior heel: 3.5 x 3.5 cm DTI    Has on the left outer thigh a red circle approximately 3 cm is hot, red and soft to palpation. There is no streaking present     Psychiatric: She has a normal mood and affect.    ASSESSMENT/ PLAN:  1. Leukocytosis: wbc 15.3 up from 6.5 on 09-07-16: will get kug; urine culture and blood culture X2. Will monitor    MD is aware of resident's narcotic use and is in agreement with current plan of care. We will attempt to wean resident as apropriate   Ok Edwards NP [facility]  Contact [phone] Monday through Friday 8am- 5pm  After hours call [phone]

## 2016-09-16 ENCOUNTER — Encounter: Payer: Self-pay | Admitting: Internal Medicine

## 2016-09-16 ENCOUNTER — Non-Acute Institutional Stay (SKILLED_NURSING_FACILITY): Payer: Medicare Other | Admitting: Internal Medicine

## 2016-09-16 DIAGNOSIS — L989 Disorder of the skin and subcutaneous tissue, unspecified: Secondary | ICD-10-CM | POA: Diagnosis not present

## 2016-09-16 DIAGNOSIS — R627 Adult failure to thrive: Secondary | ICD-10-CM | POA: Diagnosis not present

## 2016-09-16 DIAGNOSIS — Z936 Other artificial openings of urinary tract status: Secondary | ICD-10-CM

## 2016-09-16 DIAGNOSIS — A419 Sepsis, unspecified organism: Secondary | ICD-10-CM | POA: Diagnosis not present

## 2016-09-16 DIAGNOSIS — G8220 Paraplegia, unspecified: Secondary | ICD-10-CM

## 2016-09-16 DIAGNOSIS — E43 Unspecified severe protein-calorie malnutrition: Secondary | ICD-10-CM

## 2016-09-16 DIAGNOSIS — D72829 Elevated white blood cell count, unspecified: Secondary | ICD-10-CM

## 2016-09-16 DIAGNOSIS — D638 Anemia in other chronic diseases classified elsewhere: Secondary | ICD-10-CM | POA: Diagnosis not present

## 2016-09-16 DIAGNOSIS — R6520 Severe sepsis without septic shock: Secondary | ICD-10-CM | POA: Diagnosis not present

## 2016-09-16 DIAGNOSIS — D709 Neutropenia, unspecified: Secondary | ICD-10-CM | POA: Diagnosis not present

## 2016-09-16 DIAGNOSIS — N319 Neuromuscular dysfunction of bladder, unspecified: Secondary | ICD-10-CM

## 2016-09-16 DIAGNOSIS — G35 Multiple sclerosis: Secondary | ICD-10-CM | POA: Diagnosis not present

## 2016-09-16 DIAGNOSIS — L89154 Pressure ulcer of sacral region, stage 4: Secondary | ICD-10-CM

## 2016-09-16 DIAGNOSIS — N390 Urinary tract infection, site not specified: Secondary | ICD-10-CM | POA: Diagnosis not present

## 2016-09-16 NOTE — Progress Notes (Signed)
Patient ID: Deanna Baldwin, female   DOB: 1975-09-01, 41 y.o.   MRN: [id number]     HISTORY AND PHYSICAL   DATE:  September 16, 2016  Location:   [place] Room Number: 215 A Place of Service: SNF (31)   Extended Emergency Contact Information Primary Emergency Contact: Dalbert [place], [place] [address] Johnnette Litter of [place] Phone: [phone] Mobile Phone: [phone] Relation: Aunt Secondary Emergency Contact: Smith,Crystal  [place] Mobile Phone: [phone] Relation: Sister  Advanced Directive information Does Patient Have a Medical Advance Directive?: Yes, Would patient like information on creating a medical advance directive?: No - Patient declined, Type of Advance Directive: Out of facility DNR (pink MOST or yellow form), Pre-existing out of facility DNR order (yellow form or pink MOST form): Pink MOST form placed in chart (order not valid for inpatient use), Does patient want to make changes to medical advance directive?: No - Patient declined  Chief Complaint  Patient presents with  . Readmit To SNF    Readmission    HPI:  41 yo female long term resident seen today for readmission into SNF following hospital stay for hypotension, UTI, sepsis, neurogenic bladder, pressure sores, MS. She was tx with IV abx --> po levaquin. she underwent ileal conduit urinary diversion on 8/3//18. She did have postop ileus that resolved with conservative tx. BC (+) contaminant. Albumin 2.5; Hgb 8.6; B12 level 261. She presents to SNF to continue long term care.  Today she reports no concerns. Nursing c/a new left lateral thigh round lesion that is not itchy but is red. Pt reports no f/c. No falls. Sleeps well. Appetite poor. WBC 15.3K; abs neutrophils 12.5K; plts 601K; albumin 3.4  MS - stable. followed by neurology at [facility] (last ov 07-07-16). She takes Rituximab every 6 months. 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 followed by facility wound care provider.  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 Followed by urology. Next appt 09-21-16 and will need cipro 500 mg twice daily on the day before appointment.    Severe Protein calorie malnutrition - albumin 2.5 at d/c but now 3.4; prealbumin 9.3. current weight is 86 lbs; she gets prostat and supplements per facility protocol; takes remeron 7.5. Mg nightly   Chronic Constipation - stable on miralax daily; dulcolax 5 mg tab daily; dulcolax 10 mg suppository daily as needed.    Anemia 2/2 chronic disease and iron deficiency - she has req'd PRBCs in the past. Hgb 10.7. She takes iron twice daily and vit B 12 daily     Hypotension - stable on midodrine 2.5 mg three times daily   Past Medical History:  Diagnosis Date  . Buttock wound 03/03/2016  . Dysrhythmia    tachycardia  . MS (multiple sclerosis) ([place])   . Neutropenia ([place])   . Pneumonia 02/2016  . Protein calorie malnutrition ([place])   . Severe sepsis ([place]) 03/03/2016  . Spastic 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 ([place])   . UTI (urinary tract infection) 02/2016    Past Surgical History:  Procedure Laterality Date  . DIVERTING ILEOSTOMY N/A 09/03/2016   Procedure: ILEAL CONDUIT  URINARY DIVERSION OPEN;  Surgeon: Ardis Hughs, MD;  Location: [facility];  Service: Urology;  Laterality: N/A;  .  NO PAST SURGERIES      Patient Care Team: [name], DO as PCP - General (Internal Medicine)  Social History   Social History  . Marital status: Single    Spouse name: N/A  . Number of children: N/A  . Years of education: N/A   Occupational History  . Disabled    Social History Main Topics  . Smoking status: Former Smoker    Packs/day: 1.00    Years: 10.00    Types: Cigarettes    Quit date: 02/01/2010  . Smokeless tobacco: Never  Used  . Alcohol use No  . Drug use: No  . Sexual activity: Not on file   Other Topics Concern  . Not on file   Social History Narrative   She is a resident at [facility].   Right-handed.     reports that she quit smoking about 6 years ago. Her smoking use included Cigarettes. She has a 10.00 pack-year smoking history. She has never used smokeless tobacco. She reports that she does not drink alcohol or use drugs.  Family History  Problem Relation Age of Onset  . Mental illness Sister    Family Status  Relation Status  . Mother Deceased  . Father Deceased  . Sister Alive  . MGM Deceased  . MGF Deceased  . PGM Deceased  . PGF Deceased  . Sister Alive    Immunization History  Administered Date(s) Administered  . Influenza,inj,Quad PF,36+ Mos 03/06/2016  . PPD Test 03/31/2016    No Known Allergies  Medications: Patient's Medications  New Prescriptions   No medications on file  Previous Medications   ACETAMINOPHEN (TYLENOL) 325 MG TABLET    Take 650 mg by mouth every 6 (six) hours as needed for mild pain.    AMINO ACIDS-PROTEIN HYDROLYS (FEEDING SUPPLEMENT, PRO-STAT SUGAR FREE 64,) LIQD    Take 30 mLs by mouth 2 (two) times daily.   BACLOFEN (LIORESAL) 20 MG TABLET    Take 20 mg by mouth 4 (four) times daily.   BISACODYL (DULCOLAX) 10 MG SUPPOSITORY    Place 1 suppository (10 mg total) rectally daily as needed for mild constipation or moderate constipation.   BISACODYL (DULCOLAX) 5 MG EC TABLET    Take 5 mg by mouth daily.   CIPROFLOXACIN (CIPRO) 500 MG TABLET    Take 1 tablet (500 mg total) by mouth 2 (two) times daily. Begin day before follow up appointment with urology. Please start on 09/20/2016.   CYANOCOBALAMIN (B-12 PO)    Take 1 tablet by mouth daily.   FERROUS SULFATE 325 (65 FE) MG TABLET    Take 1 tablet (325 mg total) by mouth 2 (two) times daily with a meal.   MIDODRINE (PROAMATINE) 2.5 MG TABLET    Take 1 tablet (2.5 mg total) by mouth 3 (three)  times daily with meals.   MIRTAZAPINE (REMERON) 7.5 MG TABLET    Take 7.5 mg by mouth at bedtime.   MULTIPLE VITAMINS-MINERALS (DECUBI-VITE) CAPS    Take 1 capsule by mouth daily.   NUTRITIONAL SUPPLEMENTS (NUTRITIONAL SUPPLEMENT PO)    House  Supplement - Give 120cc by mouth three times daily   POLYETHYLENE GLYCOL (MIRALAX / GLYCOLAX) PACKET    Take 17 g by mouth daily.   VITAMIN C (ASCORBIC ACID) 500 MG TABLET    Take 500 mg by mouth 2 (two) times daily.  Modified Medications   No medications on file  Discontinued Medications   No medications on  file    Review of Systems  Constitutional: Positive for appetite change.  Musculoskeletal: Positive for gait problem.  Skin: Positive for wound.  All other systems reviewed and are negative.   Vitals:   09/16/16 1038  BP: 112/62  Pulse: 94  Resp: 17  Temp: (!) 97.1 F (36.2 C)  SpO2: 97%  Weight: 86 lb 8 oz (39.2 kg)  Height: '5\' 9"'  (1.753 m)   Body mass index is 12.77 kg/m.  Physical Exam  Constitutional: She is oriented to person, place, and time. She appears well-developed and well-nourished.  Frail appearing in NAD, lying in bed  HENT:  Mouth/Throat: Oropharynx is clear and moist. No oropharyngeal exudate.  Poor dentition, MMM, no oral thrush  Eyes: Pupils are equal, round, and reactive to light. No scleral icterus.  Neck: Neck supple. Carotid bruit is not present. No tracheal deviation present. No thyromegaly present.  Cardiovascular: Regular rhythm and intact distal pulses.  Tachycardia present.  Exam reveals no gallop and no friction rub.   Murmur heard.  Systolic murmur is present with a grade of 1/6  No LE edema b/l. no calf TTP.   Pulmonary/Chest: Effort normal and breath sounds normal. No stridor. No respiratory distress. She has no wheezes. She has no rales.  Abdominal: Soft. Normal appearance and bowel sounds are normal. She exhibits no distension and no mass. There is no hepatomegaly. There is no tenderness. There  is no rigidity, no rebound and no guarding. No hernia.  Incision intact with no dehiscence, redness or d/c. Urostomy intact   Musculoskeletal: She exhibits edema and deformity.  Lymphadenopathy:    She has no cervical adenopathy.  Neurological: She is alert and oriented to person, place, and time.  Skin: Skin is warm and dry. No rash noted.  Right lateral thigh raised red blancheable; left lesion; sacral ulcer per wound care  Psychiatric: She has a normal mood and affect. Her behavior is normal. Judgment and thought content normal.     Labs reviewed: Abstract on 09/15/2016  Component Date Value Ref Range Status  . Hemoglobin 09/14/2016 10.7* 12.0 - 16.0 Final  . HCT 09/14/2016 35* 36 - 46 Final  . Neutrophils Absolute 09/14/2016 13   Final  . Platelets 09/14/2016 601* 150 - 399 Final  . WBC 09/14/2016 15.3   Final  . Glucose 09/14/2016 102   Final  . BUN 09/14/2016 11  4 - 21 Final  . Creatinine 09/14/2016 0.4* 0.5 - 1.1 Final  . Potassium 09/14/2016 3.8  3.4 - 5.3 Final  . Sodium 09/14/2016 144  137 - 147 Final  . Alkaline Phosphatase 09/14/2016 141* 25 - 125 Final  . ALT 09/14/2016 12  7 - 35 Final  . AST 09/14/2016 14  13 - 35 Final  . Bilirubin, Total 09/14/2016 0.3   Final  Admission on 08/30/2016, Discharged on 09/10/2016  No results displayed because visit has over 200 results.    Abstract on 08/17/2016  Component Date Value Ref Range Status  . Hemoglobin 08/12/2016 10.1* 12.0 - 16.0 Final  . HCT 08/12/2016 33* 36 - 46 Final  . Neutrophils Absolute 08/12/2016 8   Final  . Platelets 08/12/2016 539* 150 - 399 Final  . WBC 08/12/2016 9.7   Final  . Glucose 08/12/2016 100   Final  . BUN 08/12/2016 9  4 - 21 Final  . Creatinine 08/12/2016 0.3* 0.5 - 1.1 Final  . Potassium 08/12/2016 4.2  3.4 - 5.3 Final  . Sodium 08/12/2016 141  137 - 147 Final  . Alkaline Phosphatase 08/12/2016 140* 25 - 125 Final  . ALT 08/12/2016 32  7 - 35 Final  . AST 08/12/2016 35  13 - 35 Final    . Bilirubin, Total 08/12/2016 0.4   Final  Admission on 07/28/2016, Discharged on 08/03/2016  Component Date Value Ref Range Status  . Sodium 07/28/2016 141  135 - 145 mmol/L Final  . Potassium 07/28/2016 4.4  3.5 - 5.1 mmol/L Final  . Chloride 07/28/2016 100* 101 - 111 mmol/L Final  . CO2 07/28/2016 29  22 - 32 mmol/L Final  . Glucose, Bld 07/28/2016 111* 65 - 99 mg/dL Final  . BUN 07/28/2016 11  6 - 20 mg/dL Final  . Creatinine, Ser 07/28/2016 0.35* 0.44 - 1.00 mg/dL Final  . Calcium 07/28/2016 9.7  8.9 - 10.3 mg/dL Final  . GFR calc non Af Amer 07/28/2016 >60  >60 mL/min Final  . GFR calc Af Amer 07/28/2016 >60  >60 mL/min Final   Comment: (NOTE) The eGFR has been calculated using the CKD EPI equation. This calculation has not been validated in all clinical situations. eGFR's persistently <60 mL/min signify possible Chronic Kidney Disease.   . Anion gap 07/28/2016 12  5 - 15 Final  . Color, Urine 07/28/2016 YELLOW  YELLOW Final  . APPearance 07/28/2016 HAZY* CLEAR Final  . Specific Gravity, Urine 07/28/2016 1.019  1.005 - 1.030 Final  . pH 07/28/2016 6.0  5.0 - 8.0 Final  . Glucose, UA 07/28/2016 NEGATIVE  NEGATIVE mg/dL Final  . Hgb urine dipstick 07/28/2016 NEGATIVE  NEGATIVE Final  . Bilirubin Urine 07/28/2016 NEGATIVE  NEGATIVE Final  . Ketones, ur 07/28/2016 NEGATIVE  NEGATIVE mg/dL Final  . Protein, ur 07/28/2016 100* NEGATIVE mg/dL Final  . Nitrite 07/28/2016 NEGATIVE  NEGATIVE Final  . Leukocytes, UA 07/28/2016 LARGE* NEGATIVE Final  . RBC / HPF 07/28/2016 6-30  0 - 5 RBC/hpf Final  . WBC, UA 07/28/2016 TOO NUMEROUS TO COUNT  0 - 5 WBC/hpf Final  . Bacteria, UA 07/28/2016 FEW* NONE SEEN Final  . Squamous Epithelial / LPF 07/28/2016 0-5* NONE SEEN Final  . Mucous 07/28/2016 PRESENT   Final  . Specimen Description 07/28/2016 URINE, CLEAN CATCH   Final  . Special Requests 07/28/2016 Normal   Final  . Culture 07/28/2016 >=100,000 COLONIES/mL PROTEUS MIRABILIS*  Final   . Report Status 07/28/2016 07/31/2016 FINAL   Final  . Organism ID, Bacteria 07/28/2016 PROTEUS MIRABILIS*  Final  . WBC 07/28/2016 9.1  4.0 - 10.5 K/uL Final  . RBC 07/28/2016 4.40  3.87 - 5.11 MIL/uL Final  . Hemoglobin 07/28/2016 11.4* 12.0 - 15.0 g/dL Final  . HCT 07/28/2016 37.7  36.0 - 46.0 % Final  . MCV 07/28/2016 85.7  78.0 - 100.0 fL Final  . MCH 07/28/2016 25.9* 26.0 - 34.0 pg Final  . MCHC 07/28/2016 30.2  30.0 - 36.0 g/dL Final  . RDW 07/28/2016 15.1  11.5 - 15.5 % Final  . Platelets 07/28/2016 715* 150 - 400 K/uL Final  . Neutrophils Relative % 07/28/2016 72  % Final  . Neutro Abs 07/28/2016 6.5  1.7 - 7.7 K/uL Final  . Lymphocytes Relative 07/28/2016 19  % Final  . Lymphs Abs 07/28/2016 1.8  0.7 - 4.0 K/uL Final  . Monocytes Relative 07/28/2016 5  % Final  . Monocytes Absolute 07/28/2016 0.5  0.1 - 1.0 K/uL Final  . Eosinophils Relative 07/28/2016 4  % Final  . Eosinophils Absolute 07/28/2016 0.3  0.0 - 0.7 K/uL Final  . Basophils Relative 07/28/2016 0  % Final  . Basophils Absolute 07/28/2016 0.0  0.0 - 0.1 K/uL Final  . Lactic Acid, Venous 07/28/2016 3.53* 0.5 - 1.9 mmol/L Final  . Specimen Description 07/29/2016 BLOOD RIGHT ANTECUBITAL   Final  . Special Requests 07/29/2016 IN PEDIATRIC BOTTLE Blood Culture adequate volume   Final  . Culture 07/29/2016    Final                   Value:NO GROWTH 5 DAYS Performed at [facility], [place] [address], [place], [place] [address]   . Report Status 07/29/2016 08/03/2016 FINAL   Final  . Specimen Description 07/29/2016 BLOOD LEFT HAND   Final  . Special Requests 07/29/2016 IN PEDIATRIC BOTTLE Blood Culture adequate volume   Final  . Culture 07/29/2016    Final                   Value:NO GROWTH 5 DAYS Performed at [facility], [place] [address], [place], [place] [address]   . Report Status 07/29/2016 08/03/2016 FINAL   Final  . Lactic Acid, Venous 07/29/2016 1.3  0.5 - 1.9 mmol/L Final  . Lactic Acid, Venous  07/29/2016 0.7  0.5 - 1.9 mmol/L Final  . Procalcitonin 07/29/2016 0.19  ng/mL Final   Comment:        Interpretation: PCT (Procalcitonin) <= 0.5 ng/mL: Systemic infection (sepsis) is not likely. Local bacterial infection is possible. (NOTE)         ICU PCT Algorithm               Non ICU PCT Algorithm    ----------------------------     ------------------------------         PCT < 0.25 ng/mL                 PCT < 0.1 ng/mL     Stopping of antibiotics            Stopping of antibiotics       strongly encouraged.               strongly encouraged.    ----------------------------     ------------------------------       PCT level decrease by               PCT < 0.25 ng/mL       >= 80% from peak PCT       OR PCT 0.25 - 0.5 ng/mL          Stopping of antibiotics                                             encouraged.     Stopping of antibiotics           encouraged.    ----------------------------     ------------------------------       PCT level decrease by              PCT >= 0.25 ng/mL       < 80% from peak PCT        AND PCT >= 0.5 ng/mL            Continuin  g antibiotics                                              encouraged.       Continuing antibiotics            encouraged.    ----------------------------     ------------------------------     PCT level increase compared          PCT > 0.5 ng/mL         with peak PCT AND          PCT >= 0.5 ng/mL             Escalation of antibiotics                                          strongly encouraged.      Escalation of antibiotics        strongly encouraged.   . WBC 07/29/2016 9.2  4.0 - 10.5 K/uL Final  . RBC 07/29/2016 3.61* 3.87 - 5.11 MIL/uL Final  . Hemoglobin 07/29/2016 9.0* 12.0 - 15.0 g/dL Final   Comment: DELTA CHECK NOTED REPEATED TO VERIFY   . HCT 07/29/2016 30.5* 36.0 - 46.0 % Final  . MCV 07/29/2016 84.5  78.0 - 100.0 fL Final  . MCH 07/29/2016 24.9* 26.0 - 34.0 pg Final  . MCHC  07/29/2016 29.5* 30.0 - 36.0 g/dL Final  . RDW 07/29/2016 15.1  11.5 - 15.5 % Final  . Platelets 07/29/2016 442* 150 - 400 K/uL Final  . Sodium 07/29/2016 141  135 - 145 mmol/L Final  . Potassium 07/29/2016 4.0  3.5 - 5.1 mmol/L Final  . Chloride 07/29/2016 105  101 - 111 mmol/L Final  . CO2 07/29/2016 27  22 - 32 mmol/L Final  . Glucose, Bld 07/29/2016 110* 65 - 99 mg/dL Final  . BUN 07/29/2016 11  6 - 20 mg/dL Final  . Creatinine, Ser 07/29/2016 0.33* 0.44 - 1.00 mg/dL Final  . Calcium 07/29/2016 9.1  8.9 - 10.3 mg/dL Final  . GFR calc non Af Amer 07/29/2016 >60  >60 mL/min Final  . GFR calc Af Amer 07/29/2016 >60  >60 mL/min Final   Comment: (NOTE) The eGFR has been calculated using the CKD EPI equation. This calculation has not been validated in all clinical situations. eGFR's persistently <60 mL/min signify possible Chronic Kidney Disease.   . Anion gap 07/29/2016 9  5 - 15 Final  . WBC 07/30/2016 8.2  4.0 - 10.5 K/uL Final  . RBC 07/30/2016 3.70* 3.87 - 5.11 MIL/uL Final  . Hemoglobin 07/30/2016 9.4* 12.0 - 15.0 g/dL Final  . HCT 07/30/2016 31.2* 36.0 - 46.0 % Final  . MCV 07/30/2016 84.3  78.0 - 100.0 fL Final  . MCH 07/30/2016 25.4* 26.0 - 34.0 pg Final  . MCHC 07/30/2016 30.1  30.0 - 36.0 g/dL Final  . RDW 07/30/2016 15.0  11.5 - 15.5 % Final  . Platelets 07/30/2016 529* 150 - 400 K/uL Final  . Sodium 07/30/2016 141  135 - 145 mmol/L Final  . Potassium 07/30/2016 4.1  3.5 - 5.1 mmol/L Final  . Chloride 07/30/2016 106  101 - 111 mmol/L Final  . CO2 07/30/2016 26  22 - 32 mmol/L Final  .  Glucose, Bld 07/30/2016 99  65 - 99 mg/dL Final  . BUN 07/30/2016 5* 6 - 20 mg/dL Final  . Creatinine, Ser 07/30/2016 <0.30* 0.44 - 1.00 mg/dL Final  . Calcium 07/30/2016 9.2  8.9 - 10.3 mg/dL Final  . Total Protein 07/30/2016 6.6  6.5 - 8.1 g/dL Final  . Albumin 07/30/2016 2.6* 3.5 - 5.0 g/dL Final  . AST 07/30/2016 34  15 - 41 U/L Final  . ALT 07/30/2016 37  14 - 54 U/L Final  .  Alkaline Phosphatase 07/30/2016 143* 38 - 126 U/L Final  . Total Bilirubin 07/30/2016 0.4  0.3 - 1.2 mg/dL Final  . GFR calc non Af Amer 07/30/2016 NOT CALCULATED  >60 mL/min Final  . GFR calc Af Amer 07/30/2016 NOT CALCULATED  >60 mL/min Final   Comment: (NOTE) The eGFR has been calculated using the CKD EPI equation. This calculation has not been validated in all clinical situations. eGFR's persistently <60 mL/min signify possible Chronic Kidney Disease.   . Anion gap 07/30/2016 9  5 - 15 Final  . Creatinine, Ser 07/31/2016 0.31* 0.44 - 1.00 mg/dL Final  . GFR calc non Af Amer 07/31/2016 >60  >60 mL/min Final  . GFR calc Af Amer 07/31/2016 >60  >60 mL/min Final   Comment: (NOTE) The eGFR has been calculated using the CKD EPI equation. This calculation has not been validated in all clinical situations. eGFR's persistently <60 mL/min signify possible Chronic Kidney Disease.   . Vancomycin Tr 07/31/2016 <4* 15 - 20 ug/mL Final  . Creatinine, Ser 08/01/2016 <0.30* 0.44 - 1.00 mg/dL Final  . GFR calc non Af Amer 08/01/2016 NOT CALCULATED  >60 mL/min Final  . GFR calc Af Amer 08/01/2016 NOT CALCULATED  >60 mL/min Final   Comment: (NOTE) The eGFR has been calculated using the CKD EPI equation. This calculation has not been validated in all clinical situations. eGFR's persistently <60 mL/min signify possible Chronic Kidney Disease.   . Creatinine, Ser 08/02/2016 <0.30* 0.44 - 1.00 mg/dL Final  . GFR calc non Af Amer 08/02/2016 NOT CALCULATED  >60 mL/min Final  . GFR calc Af Amer 08/02/2016 NOT CALCULATED  >60 mL/min Final   Comment: (NOTE) The eGFR has been calculated using the CKD EPI equation. This calculation has not been validated in all clinical situations. eGFR's persistently <60 mL/min signify possible Chronic Kidney Disease.   . Creatinine, Ser 08/03/2016 0.33* 0.44 - 1.00 mg/dL Final  . GFR calc non Af Amer 08/03/2016 >60  >60 mL/min Final  . GFR calc Af Amer 08/03/2016 >60   >60 mL/min Final   Comment: (NOTE) The eGFR has been calculated using the CKD EPI equation. This calculation has not been validated in all clinical situations. eGFR's persistently <60 mL/min signify possible Chronic Kidney Disease.   Abstract on 07/20/2016  Component Date Value Ref Range Status  . Glucose 07/19/2016 140   Final  . BUN 07/19/2016 8  4 - 21 Final  . Creatinine 07/19/2016 0.2* 0.5 - 1.1 Final  . Potassium 07/19/2016 3.8  3.4 - 5.3 Final  . Sodium 07/19/2016 138  137 - 147 Final  . Alkaline Phosphatase 07/19/2016 152* 25 - 125 Final  . ALT 07/19/2016 46* 7 - 35 Final  . AST 07/19/2016 34  13 - 35 Final  . Bilirubin, Total 07/19/2016 0.2   Final  Nursing Home on 07/12/2016  Component Date Value Ref Range Status  . Hemoglobin A1C 06/24/2016 5.5   Final  Admission on 07/07/2016, Discharged  on 07/10/2016  Component Date Value Ref Range Status  . Sodium 07/07/2016 139  135 - 145 mmol/L Final  . Potassium 07/07/2016 3.5  3.5 - 5.1 mmol/L Final  . Chloride 07/07/2016 101  101 - 111 mmol/L Final  . CO2 07/07/2016 27  22 - 32 mmol/L Final  . Glucose, Bld 07/07/2016 154* 65 - 99 mg/dL Final  . BUN 07/07/2016 7  6 - 20 mg/dL Final  . Creatinine, Ser 07/07/2016 0.46  0.44 - 1.00 mg/dL Final  . Calcium 07/07/2016 8.8* 8.9 - 10.3 mg/dL Final  . Total Protein 07/07/2016 6.5  6.5 - 8.1 g/dL Final  . Albumin 07/07/2016 2.4* 3.5 - 5.0 g/dL Final  . AST 07/07/2016 62* 15 - 41 U/L Final  . ALT 07/07/2016 66* 14 - 54 U/L Final  . Alkaline Phosphatase 07/07/2016 203* 38 - 126 U/L Final  . Total Bilirubin 07/07/2016 0.4  0.3 - 1.2 mg/dL Final  . GFR calc non Af Amer 07/07/2016 >60  >60 mL/min Final  . GFR calc Af Amer 07/07/2016 >60  >60 mL/min Final   Comment: (NOTE) The eGFR has been calculated using the CKD EPI equation. This calculation has not been validated in all clinical situations. eGFR's persistently <60 mL/min signify possible Chronic Kidney Disease.   . Anion gap  07/07/2016 11  5 - 15 Final  . WBC 07/07/2016 10.8* 4.0 - 10.5 K/uL Final  . RBC 07/07/2016 3.95  3.87 - 5.11 MIL/uL Final  . Hemoglobin 07/07/2016 10.4* 12.0 - 15.0 g/dL Final  . HCT 07/07/2016 34.9* 36.0 - 46.0 % Final  . MCV 07/07/2016 88.4  78.0 - 100.0 fL Final  . MCH 07/07/2016 26.3  26.0 - 34.0 pg Final  . MCHC 07/07/2016 29.8* 30.0 - 36.0 g/dL Final  . RDW 07/07/2016 14.9  11.5 - 15.5 % Final  . Platelets 07/07/2016 661* 150 - 400 K/uL Final  . Neutrophils Relative % 07/07/2016 89  % Final  . Neutro Abs 07/07/2016 9.5* 1.7 - 7.7 K/uL Final  . Lymphocytes Relative 07/07/2016 8  % Final  . Lymphs Abs 07/07/2016 0.8  0.7 - 4.0 K/uL Final  . Monocytes Relative 07/07/2016 3  % Final  . Monocytes Absolute 07/07/2016 0.4  0.1 - 1.0 K/uL Final  . Eosinophils Relative 07/07/2016 0  % Final  . Eosinophils Absolute 07/07/2016 0.0  0.0 - 0.7 K/uL Final  . Basophils Relative 07/07/2016 0  % Final  . Basophils Absolute 07/07/2016 0.0  0.0 - 0.1 K/uL Final  . Lactic Acid, Venous 07/07/2016 2.47* 0.5 - 1.9 mmol/L Final  . Comment 07/07/2016 NOTIFIED PHYSICIAN   Final  . Specimen Description 07/07/2016 BLOOD RIGHT ANTECUBITAL   Final  . Special Requests 07/07/2016 BOTTLES DRAWN AEROBIC AND ANAEROBIC Blood Culture adequate volume   Final  . Culture 07/07/2016 NO GROWTH 5 DAYS   Final  . Report Status 07/07/2016 07/12/2016 FINAL   Final  . Specimen Description 07/07/2016 BLOOD RIGHT ARM   Final  . Special Requests 07/07/2016 BOTTLES DRAWN AEROBIC AND ANAEROBIC Blood Culture adequate volume   Final  . Culture 07/07/2016 NO GROWTH 5 DAYS   Final  . Report Status 07/07/2016 07/12/2016 FINAL   Final  . Color, Urine 07/07/2016 YELLOW  YELLOW Final  . APPearance 07/07/2016 TURBID* CLEAR Final  . Specific Gravity, Urine 07/07/2016 1.020  1.005 - 1.030 Final  . pH 07/07/2016 8.5* 5.0 - 8.0 Final  . Glucose, UA 07/07/2016 NEGATIVE  NEGATIVE mg/dL Final  .  Hgb urine dipstick 07/07/2016 SMALL* NEGATIVE  Final  . Bilirubin Urine 07/07/2016 NEGATIVE  NEGATIVE Final  . Ketones, ur 07/07/2016 NEGATIVE  NEGATIVE mg/dL Final  . Protein, ur 07/07/2016 100* NEGATIVE mg/dL Final  . Nitrite 07/07/2016 POSITIVE* NEGATIVE Final  . Leukocytes, UA 07/07/2016 MODERATE* NEGATIVE Final  . Specimen Description 07/07/2016 URINE, CATHETERIZED   Final  . Special Requests 07/07/2016 Normal   Final  . Culture 07/07/2016 >=100,000 COLONIES/mL PROTEUS MIRABILIS*  Final  . Report Status 07/07/2016 07/10/2016 FINAL   Final  . Organism ID, Bacteria 07/07/2016 PROTEUS MIRABILIS*  Final  . RBC / HPF 07/07/2016 0-5  0 - 5 RBC/hpf Final  . WBC, UA 07/07/2016 TOO NUMEROUS TO COUNT  0 - 5 WBC/hpf Final  . Bacteria, UA 07/07/2016 MANY* NONE SEEN Final  . Squamous Epithelial / LPF 07/07/2016 0-5* NONE SEEN Final  . Urine-Other 07/07/2016 LESS THAN 10 mL OF URINE SUBMITTED   Final   MICROSCOPIC EXAM PERFORMED ON UNCONCENTRATED URINE  . Sodium 07/08/2016 139  135 - 145 mmol/L Final  . Potassium 07/08/2016 3.2* 3.5 - 5.1 mmol/L Final  . Chloride 07/08/2016 105  101 - 111 mmol/L Final  . CO2 07/08/2016 24  22 - 32 mmol/L Final  . Glucose, Bld 07/08/2016 129* 65 - 99 mg/dL Final  . BUN 07/08/2016 <5* 6 - 20 mg/dL Final  . Creatinine, Ser 07/08/2016 0.30* 0.44 - 1.00 mg/dL Final  . Calcium 07/08/2016 8.0* 8.9 - 10.3 mg/dL Final  . GFR calc non Af Amer 07/08/2016 >60  >60 mL/min Final  . GFR calc Af Amer 07/08/2016 >60  >60 mL/min Final   Comment: (NOTE) The eGFR has been calculated using the CKD EPI equation. This calculation has not been validated in all clinical situations. eGFR's persistently <60 mL/min signify possible Chronic Kidney Disease.   . Anion gap 07/08/2016 10  5 - 15 Final  . WBC 07/08/2016 7.9  4.0 - 10.5 K/uL Final  . RBC 07/08/2016 2.97* 3.87 - 5.11 MIL/uL Final  . Hemoglobin 07/08/2016 7.7* 12.0 - 15.0 g/dL Final   Comment: DELTA CHECK NOTED REPEATED TO VERIFY   . HCT 07/08/2016 26.3* 36.0 - 46.0 %  Final  . MCV 07/08/2016 88.6  78.0 - 100.0 fL Final  . MCH 07/08/2016 25.9* 26.0 - 34.0 pg Final  . MCHC 07/08/2016 29.3* 30.0 - 36.0 g/dL Final  . RDW 07/08/2016 15.2  11.5 - 15.5 % Final  . Platelets 07/08/2016 463* 150 - 400 K/uL Final  . Lactic Acid, Venous 07/07/2016 2.3* 0.5 - 1.9 mmol/L Final   Comment: CRITICAL RESULT CALLED TO, READ BACK BY AND VERIFIED WITH: M.WOOD,RN 0050 07/08/16 M.CAMPBELL   . Lactic Acid, Venous 07/08/2016 1.4  0.5 - 1.9 mmol/L Final  . MRSA by PCR 07/07/2016 POSITIVE* NEGATIVE Final   Comment:        The GeneXpert MRSA Assay (FDA approved for NASAL specimens only), is one component of a comprehensive MRSA colonization surveillance program. It is not intended to diagnose MRSA infection nor to guide or monitor treatment for MRSA infections. RESULT CALLED TO, READ BACK BY AND VERIFIED WITH: Rob Bunting RN AT 0622 07/08/16 BY A.DAVIS   . Cortisol, Plasma 07/08/2016 10.3  ug/dL Final   Comment: (NOTE) AM    6.7 - 22.6 ug/dL PM   <10.0       ug/dL   . WBC 07/09/2016 7.3  4.0 - 10.5 K/uL Final  . RBC 07/09/2016 3.43* 3.87 - 5.11 MIL/uL Final  .  Hemoglobin 07/09/2016 9.0* 12.0 - 15.0 g/dL Final  . HCT 07/09/2016 30.7* 36.0 - 46.0 % Final  . MCV 07/09/2016 89.5  78.0 - 100.0 fL Final  . MCH 07/09/2016 26.2  26.0 - 34.0 pg Final  . MCHC 07/09/2016 29.3* 30.0 - 36.0 g/dL Final  . RDW 07/09/2016 15.1  11.5 - 15.5 % Final  . Platelets 07/09/2016 521* 150 - 400 K/uL Final  . Sodium 07/09/2016 141  135 - 145 mmol/L Final  . Potassium 07/09/2016 4.8  3.5 - 5.1 mmol/L Final  . Chloride 07/09/2016 108  101 - 111 mmol/L Final  . CO2 07/09/2016 24  22 - 32 mmol/L Final  . Glucose, Bld 07/09/2016 82  65 - 99 mg/dL Final  . BUN 07/09/2016 5* 6 - 20 mg/dL Final  . Creatinine, Ser 07/09/2016 0.31* 0.44 - 1.00 mg/dL Final  . Calcium 07/09/2016 9.1  8.9 - 10.3 mg/dL Final  . GFR calc non Af Amer 07/09/2016 >60  >60 mL/min Final  . GFR calc Af Amer 07/09/2016 >60  >60  mL/min Final   Comment: (NOTE) The eGFR has been calculated using the CKD EPI equation. This calculation has not been validated in all clinical situations. eGFR's persistently <60 mL/min signify possible Chronic Kidney Disease.   . Anion gap 07/09/2016 9  5 - 15 Final  . WBC 07/10/2016 7.4  4.0 - 10.5 K/uL Final  . RBC 07/10/2016 4.00  3.87 - 5.11 MIL/uL Final  . Hemoglobin 07/10/2016 10.4* 12.0 - 15.0 g/dL Final  . HCT 07/10/2016 35.9* 36.0 - 46.0 % Final  . MCV 07/10/2016 89.8  78.0 - 100.0 fL Final  . MCH 07/10/2016 26.0  26.0 - 34.0 pg Final  . MCHC 07/10/2016 29.0* 30.0 - 36.0 g/dL Final  . RDW 07/10/2016 15.2  11.5 - 15.5 % Final  . Platelets 07/10/2016 349  150 - 400 K/uL Final  . Sodium 07/10/2016 141  135 - 145 mmol/L Final  . Potassium 07/10/2016 5.6* 3.5 - 5.1 mmol/L Final   SLIGHT HEMOLYSIS  . Chloride 07/10/2016 104  101 - 111 mmol/L Final  . CO2 07/10/2016 27  22 - 32 mmol/L Final  . Glucose, Bld 07/10/2016 89  65 - 99 mg/dL Final  . BUN 07/10/2016 8  6 - 20 mg/dL Final  . Creatinine, Ser 07/10/2016 0.39* 0.44 - 1.00 mg/dL Final  . Calcium 07/10/2016 9.4  8.9 - 10.3 mg/dL Final  . GFR calc non Af Amer 07/10/2016 >60  >60 mL/min Final  . GFR calc Af Amer 07/10/2016 >60  >60 mL/min Final   Comment: (NOTE) The eGFR has been calculated using the CKD EPI equation. This calculation has not been validated in all clinical situations. eGFR's persistently <60 mL/min signify possible Chronic Kidney Disease.   . Anion gap 07/10/2016 10  5 - 15 Final  . Preg Test, Ur 07/10/2016 NEGATIVE  NEGATIVE Final   Comment:        THE SENSITIVITY OF THIS METHODOLOGY IS >20 mIU/mL.   Nursing Home on 06/23/2016  Component Date Value Ref Range Status  . Hemoglobin 06/23/2016 9.4* 12.0 - 16.0 g/dL Final  . HCT 06/23/2016 31* 36 - 46 % Final  . Neutrophils Absolute 06/23/2016 10  /L Final  . Platelets 06/23/2016 629* 150 - 399 K/L Final  . WBC 06/23/2016 12.1  10^3/mL Final  .  Glucose 06/23/2016 150  mg/dL Final  . BUN 06/23/2016 12  4 - 21 mg/dL Final  . Creatinine 06/23/2016  0.2* 0.5 - 1.1 mg/dL Final  . Potassium 06/23/2016 3.9  3.4 - 5.3 mmol/L Final  . Sodium 06/23/2016 139  137 - 147 mmol/L Final  . Alkaline Phosphatase 06/23/2016 219* 25 - 125 U/L Final  . ALT 06/23/2016 63* 7 - 35 U/L Final  . AST 06/23/2016 44* 13 - 35 U/L Final  . Bilirubin, Total 06/23/2016 0.2  mg/dL Final  . Vitamin B-12 06/23/2016 287   Final  . TSH 06/23/2016 2.28  0.41 - 5.90 uIU/mL Final    Dg Chest 2 View  Result Date: 08/31/2016 CLINICAL DATA:  Fever. Previous episodes of pneumonia. History of multiple scleroses. Thumb are smoker. EXAM: CHEST  2 VIEW COMPARISON:  Chest x-ray of July 29, 2016 FINDINGS: The lungs are well-expanded. There is no focal infiltrate. There is no pleural effusion. The interstitial markings of both lungs are coarse but stable. The heart and pulmonary vascularity are normal. The mediastinum is normal in width. The bony thorax exhibits no acute abnormality. IMPRESSION: Mild chronic bronchitic changes, stable. No pneumonia, CHF, nor other acute cardiopulmonary abnormality. Electronically Signed   By: David  Martinique M.D.   On: 08/31/2016 08:40   Dg Abd 1 View  Result Date: 09/06/2016 CLINICAL DATA:  Abdominal distension EXAM: ABDOMEN - 1 VIEW COMPARISON:  09/05/2016 abdominal radiographs FINDINGS: Bilateral nephroureteral stents are stable in position. Additional surgical drain overlying the pelvis is stable in position. Skin staples overlie the left abdomen. Surgical clips overlie the bilateral deep pelvis. Mild diffuse gaseous distention of the small and large bowel, mildly increased. No evidence of pneumatosis or pneumoperitoneum. No radiopaque urolithiasis. IMPRESSION: Mild diffuse gaseous distention of the small and large bowel, mildly increased, most compatible with mild adynamic ileus in the postoperative setting. Electronically Signed   By: Ilona Sorrel M.D.    On: 09/06/2016 12:52   Ct Angio Chest Pe W Or Wo Contrast  Result Date: 08/31/2016 CLINICAL DATA:  Fever with positive D-dimer examination. Multiple sclerosis EXAM: CT ANGIOGRAPHY CHEST WITH CONTRAST TECHNIQUE: Multidetector CT imaging of the chest was performed using the standard protocol during bolus administration of intravenous contrast. Multiplanar CT image reconstructions and MIPs were obtained to evaluate the vascular anatomy. CONTRAST:  100 mL Isovue 370 nonionic COMPARISON:  Chest CT April 07, 2016; chest radiograph August 31, 2016 FINDINGS: Cardiovascular: There is no demonstrable pulmonary embolus. There is no thoracic aortic aneurysm or dissection. The visualized great vessels appear unremarkable. There is no appreciable pericardial thickening. Mediastinum/Nodes: Thyroid appears unremarkable. There is no evident thoracic adenopathy. Lungs/Pleura: There is scarring in the extreme lung apices. There is mild central peribronchial thickening. There is a small pleural effusion on each side. There is no edema or consolidation. There is slight there is atelectatic change in the lateral and posterior left base regions. There is suspected early pneumonia in the lateral left base. Upper Abdomen: Visualized upper abdominal structures appear unremarkable. Musculoskeletal: There are no blastic or lytic bone lesions. Review of the MIP images confirms the above findings. IMPRESSION: 1.  No demonstrable pulmonary embolus. 2.  Atelectasis with probable early pneumonia lateral left base. 3. Mild central peribronchial thickening. Suspect chronic bronchitis. 4.  Small pleural effusions bilaterally. 5.  No demonstrable adenopathy. Electronically Signed   By: Lowella Grip III M.D.   On: 08/31/2016 09:06   Dg Abd 2 Views  Result Date: 09/05/2016 CLINICAL DATA:  Post op ureteral conduit surgery x 2 days ago, pt c/o intermittent pain, firm abd on palpation per chart. Pt quadriplegic,  has surgical drains in place on  left side. Unable to raise LEFT arm for imaging in decubitus view. EXAM: ABDOMEN - 2 VIEW COMPARISON:  07/10/2016 bowel gas pattern is nonobstructive. Patient has bilateral ureterostomy tubes to ileal conduit in the right lower quadrant. Surgical clips overlie the abdomen. No free intraperitoneal air on the decubitus view of the abdomen. Chronic changes in both hips. No evidence for free intraperitoneal air. Small hiatal hernia noted. FINDINGS: The bowel gas pattern is normal. There is no evidence of free air. No radio-opaque calculi or other significant radiographic abnormality is seen. IMPRESSION: 1. Postoperative changes. 2. Ureteral stents. 3. Hiatal hernia. 4. Nonobstructive bowel gas pattern. Electronically Signed   By: Nolon Nations M.D.   On: 09/05/2016 13:36   US Abdomen Limited Ruq  Result Date: 08/31/2016 CLINICAL DATA:  Abnormal liver enzymes bowel EXAM: ULTRASOUND ABDOMEN LIMITED RIGHT UPPER QUADRANT COMPARISON:  None FINDINGS: Gallbladder: No gallstones or wall thickening visualized. No sonographic Murphy sign noted by sonographer. Common bile duct: Diameter: Normal at 4 mm Liver: No focal lesion identified. Within normal limits in parenchymal echogenicity. IMPRESSION: Normal RIGHT upper quadrant ultrasound. Electronically Signed   By: Suzy Bouchard M.D.   On: 08/31/2016 08:18     Assessment/Plan   ICD-10-CM   1. Skin lesions L98.9    with possible MS flare  2. Leukocytosis, unspecified type D72.829   3. FTT (failure to thrive) in adult R62.7   4. Protein-calorie malnutrition, severe ([place]) E43   5. Relapsing remitting multiple sclerosis ([place]) G35   6. 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 Anemia of chronic disease D63.8   8. 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 Neurogenic bladder N31.9    s/p ileal conduit urinary diversion  10. Decubitus ulcer of sacral region, stage 4 ([facility]) L89.154      Wound care to perform punch bx of right  thigh lesion. Cont wound care to pressure sores.   Cont current meds as ordered  Check CT abd/pelvis w/wo contrast to further eval leukocytosis with left shift and skin lesion  Check CXR  2 view  PT/OT/ST as ordered  F/u with specialists as scheduled  Nutritional supplements as ordered  GOAL: short term rehab then long term care. Communicated with pt and nursing.  Will follow  Beauregard Jarrells S. Perlie Gold  [facility] [address] [place], [place] [address] [phone] Cell (Monday-Friday 8 AM - 5 PM) [phone] After 5 PM and follow prompts

## 2016-09-17 ENCOUNTER — Encounter (HOSPITAL_COMMUNITY): Payer: Self-pay

## 2016-09-17 ENCOUNTER — Ambulatory Visit (HOSPITAL_COMMUNITY)
Admission: RE | Admit: 2016-09-17 | Discharge: 2016-09-17 | Disposition: A | Discharge status: Home / Self Care | Payer: Medicare Other | Source: Ambulatory Visit | Attending: Internal Medicine | Admitting: Internal Medicine

## 2016-09-17 ENCOUNTER — Other Ambulatory Visit: Payer: Self-pay | Admitting: Internal Medicine

## 2016-09-17 DIAGNOSIS — Z936 Other artificial openings of urinary tract status: Secondary | ICD-10-CM

## 2016-09-17 DIAGNOSIS — R1084 Generalized abdominal pain: Secondary | ICD-10-CM | POA: Diagnosis not present

## 2016-09-17 DIAGNOSIS — R109 Unspecified abdominal pain: Secondary | ICD-10-CM

## 2016-09-17 DIAGNOSIS — K5649 Other impaction of intestine: Secondary | ICD-10-CM | POA: Diagnosis not present

## 2016-09-17 DIAGNOSIS — R140 Abdominal distension (gaseous): Secondary | ICD-10-CM

## 2016-09-17 DIAGNOSIS — D72829 Elevated white blood cell count, unspecified: Secondary | ICD-10-CM

## 2016-09-21 DIAGNOSIS — N3644 Muscular disorders of urethra: Secondary | ICD-10-CM | POA: Diagnosis not present

## 2016-09-21 DIAGNOSIS — G114 Hereditary spastic paraplegia: Secondary | ICD-10-CM | POA: Diagnosis not present

## 2016-09-22 DIAGNOSIS — G35 Multiple sclerosis: Secondary | ICD-10-CM | POA: Diagnosis not present

## 2016-09-22 DIAGNOSIS — H47213 Primary optic atrophy, bilateral: Secondary | ICD-10-CM | POA: Diagnosis not present

## 2016-09-22 DIAGNOSIS — H2513 Age-related nuclear cataract, bilateral: Secondary | ICD-10-CM | POA: Diagnosis not present

## 2016-09-28 DIAGNOSIS — L89613 Pressure ulcer of right heel, stage 3: Secondary | ICD-10-CM | POA: Diagnosis not present

## 2016-09-28 DIAGNOSIS — L89623 Pressure ulcer of left heel, stage 3: Secondary | ICD-10-CM | POA: Diagnosis not present

## 2016-09-28 DIAGNOSIS — L89154 Pressure ulcer of sacral region, stage 4: Secondary | ICD-10-CM | POA: Diagnosis not present

## 2016-09-30 DIAGNOSIS — G114 Hereditary spastic paraplegia: Secondary | ICD-10-CM | POA: Diagnosis not present

## 2016-10-04 DIAGNOSIS — R1312 Dysphagia, oropharyngeal phase: Secondary | ICD-10-CM | POA: Diagnosis not present

## 2016-10-04 DIAGNOSIS — A419 Sepsis, unspecified organism: Secondary | ICD-10-CM | POA: Diagnosis not present

## 2016-10-04 DIAGNOSIS — R6520 Severe sepsis without septic shock: Secondary | ICD-10-CM | POA: Diagnosis not present

## 2016-10-04 DIAGNOSIS — G35 Multiple sclerosis: Secondary | ICD-10-CM | POA: Diagnosis not present

## 2016-10-04 DIAGNOSIS — M6281 Muscle weakness (generalized): Secondary | ICD-10-CM | POA: Diagnosis not present

## 2016-10-05 DIAGNOSIS — L89154 Pressure ulcer of sacral region, stage 4: Secondary | ICD-10-CM | POA: Diagnosis not present

## 2016-10-05 DIAGNOSIS — L89144 Pressure ulcer of left lower back, stage 4: Secondary | ICD-10-CM | POA: Diagnosis not present

## 2016-10-05 DIAGNOSIS — A419 Sepsis, unspecified organism: Secondary | ICD-10-CM | POA: Diagnosis not present

## 2016-10-05 DIAGNOSIS — G35 Multiple sclerosis: Secondary | ICD-10-CM | POA: Diagnosis not present

## 2016-10-05 DIAGNOSIS — L89130 Pressure ulcer of right lower back, unstageable: Secondary | ICD-10-CM | POA: Diagnosis not present

## 2016-10-05 DIAGNOSIS — R1312 Dysphagia, oropharyngeal phase: Secondary | ICD-10-CM | POA: Diagnosis not present

## 2016-10-05 DIAGNOSIS — R6520 Severe sepsis without septic shock: Secondary | ICD-10-CM | POA: Diagnosis not present

## 2016-10-05 DIAGNOSIS — M6281 Muscle weakness (generalized): Secondary | ICD-10-CM | POA: Diagnosis not present

## 2016-10-06 DIAGNOSIS — R6520 Severe sepsis without septic shock: Secondary | ICD-10-CM | POA: Diagnosis not present

## 2016-10-06 DIAGNOSIS — A419 Sepsis, unspecified organism: Secondary | ICD-10-CM | POA: Diagnosis not present

## 2016-10-06 DIAGNOSIS — M6281 Muscle weakness (generalized): Secondary | ICD-10-CM | POA: Diagnosis not present

## 2016-10-06 DIAGNOSIS — R1312 Dysphagia, oropharyngeal phase: Secondary | ICD-10-CM | POA: Diagnosis not present

## 2016-10-06 DIAGNOSIS — G35 Multiple sclerosis: Secondary | ICD-10-CM | POA: Diagnosis not present

## 2016-10-07 DIAGNOSIS — M6281 Muscle weakness (generalized): Secondary | ICD-10-CM | POA: Diagnosis not present

## 2016-10-07 DIAGNOSIS — R1312 Dysphagia, oropharyngeal phase: Secondary | ICD-10-CM | POA: Diagnosis not present

## 2016-10-07 DIAGNOSIS — G35 Multiple sclerosis: Secondary | ICD-10-CM | POA: Diagnosis not present

## 2016-10-07 DIAGNOSIS — A419 Sepsis, unspecified organism: Secondary | ICD-10-CM | POA: Diagnosis not present

## 2016-10-07 DIAGNOSIS — R6520 Severe sepsis without septic shock: Secondary | ICD-10-CM | POA: Diagnosis not present

## 2016-10-08 DIAGNOSIS — R1312 Dysphagia, oropharyngeal phase: Secondary | ICD-10-CM | POA: Diagnosis not present

## 2016-10-08 DIAGNOSIS — G35 Multiple sclerosis: Secondary | ICD-10-CM | POA: Diagnosis not present

## 2016-10-08 DIAGNOSIS — A419 Sepsis, unspecified organism: Secondary | ICD-10-CM | POA: Diagnosis not present

## 2016-10-08 DIAGNOSIS — R6520 Severe sepsis without septic shock: Secondary | ICD-10-CM | POA: Diagnosis not present

## 2016-10-08 DIAGNOSIS — M6281 Muscle weakness (generalized): Secondary | ICD-10-CM | POA: Diagnosis not present

## 2016-10-09 DIAGNOSIS — R1312 Dysphagia, oropharyngeal phase: Secondary | ICD-10-CM | POA: Diagnosis not present

## 2016-10-09 DIAGNOSIS — R6520 Severe sepsis without septic shock: Secondary | ICD-10-CM | POA: Diagnosis not present

## 2016-10-09 DIAGNOSIS — M6281 Muscle weakness (generalized): Secondary | ICD-10-CM | POA: Diagnosis not present

## 2016-10-09 DIAGNOSIS — G35 Multiple sclerosis: Secondary | ICD-10-CM | POA: Diagnosis not present

## 2016-10-09 DIAGNOSIS — A419 Sepsis, unspecified organism: Secondary | ICD-10-CM | POA: Diagnosis not present

## 2016-10-11 DIAGNOSIS — A419 Sepsis, unspecified organism: Secondary | ICD-10-CM | POA: Diagnosis not present

## 2016-10-11 DIAGNOSIS — R1312 Dysphagia, oropharyngeal phase: Secondary | ICD-10-CM | POA: Diagnosis not present

## 2016-10-11 DIAGNOSIS — G35 Multiple sclerosis: Secondary | ICD-10-CM | POA: Diagnosis not present

## 2016-10-11 DIAGNOSIS — R6520 Severe sepsis without septic shock: Secondary | ICD-10-CM | POA: Diagnosis not present

## 2016-10-11 DIAGNOSIS — M6281 Muscle weakness (generalized): Secondary | ICD-10-CM | POA: Diagnosis not present

## 2016-10-12 DIAGNOSIS — G35 Multiple sclerosis: Secondary | ICD-10-CM | POA: Diagnosis not present

## 2016-10-12 DIAGNOSIS — L89894 Pressure ulcer of other site, stage 4: Secondary | ICD-10-CM | POA: Diagnosis not present

## 2016-10-12 DIAGNOSIS — M6281 Muscle weakness (generalized): Secondary | ICD-10-CM | POA: Diagnosis not present

## 2016-10-12 DIAGNOSIS — A419 Sepsis, unspecified organism: Secondary | ICD-10-CM | POA: Diagnosis not present

## 2016-10-12 DIAGNOSIS — R6520 Severe sepsis without septic shock: Secondary | ICD-10-CM | POA: Diagnosis not present

## 2016-10-12 DIAGNOSIS — R1312 Dysphagia, oropharyngeal phase: Secondary | ICD-10-CM | POA: Diagnosis not present

## 2016-10-12 DIAGNOSIS — L89130 Pressure ulcer of right lower back, unstageable: Secondary | ICD-10-CM | POA: Diagnosis not present

## 2016-10-12 DIAGNOSIS — L89154 Pressure ulcer of sacral region, stage 4: Secondary | ICD-10-CM | POA: Diagnosis not present

## 2016-10-13 DIAGNOSIS — R6520 Severe sepsis without septic shock: Secondary | ICD-10-CM | POA: Diagnosis not present

## 2016-10-13 DIAGNOSIS — M6281 Muscle weakness (generalized): Secondary | ICD-10-CM | POA: Diagnosis not present

## 2016-10-13 DIAGNOSIS — A419 Sepsis, unspecified organism: Secondary | ICD-10-CM | POA: Diagnosis not present

## 2016-10-13 DIAGNOSIS — G35 Multiple sclerosis: Secondary | ICD-10-CM | POA: Diagnosis not present

## 2016-10-13 DIAGNOSIS — R1312 Dysphagia, oropharyngeal phase: Secondary | ICD-10-CM | POA: Diagnosis not present

## 2016-10-14 DIAGNOSIS — R1312 Dysphagia, oropharyngeal phase: Secondary | ICD-10-CM | POA: Diagnosis not present

## 2016-10-14 DIAGNOSIS — A419 Sepsis, unspecified organism: Secondary | ICD-10-CM | POA: Diagnosis not present

## 2016-10-14 DIAGNOSIS — G35 Multiple sclerosis: Secondary | ICD-10-CM | POA: Diagnosis not present

## 2016-10-14 DIAGNOSIS — R6520 Severe sepsis without septic shock: Secondary | ICD-10-CM | POA: Diagnosis not present

## 2016-10-14 DIAGNOSIS — M6281 Muscle weakness (generalized): Secondary | ICD-10-CM | POA: Diagnosis not present

## 2016-10-15 DIAGNOSIS — M6281 Muscle weakness (generalized): Secondary | ICD-10-CM | POA: Diagnosis not present

## 2016-10-15 DIAGNOSIS — R1312 Dysphagia, oropharyngeal phase: Secondary | ICD-10-CM | POA: Diagnosis not present

## 2016-10-15 DIAGNOSIS — R6520 Severe sepsis without septic shock: Secondary | ICD-10-CM | POA: Diagnosis not present

## 2016-10-15 DIAGNOSIS — G35 Multiple sclerosis: Secondary | ICD-10-CM | POA: Diagnosis not present

## 2016-10-15 DIAGNOSIS — A419 Sepsis, unspecified organism: Secondary | ICD-10-CM | POA: Diagnosis not present

## 2016-10-18 DIAGNOSIS — G35 Multiple sclerosis: Secondary | ICD-10-CM | POA: Diagnosis not present

## 2016-10-18 DIAGNOSIS — R1312 Dysphagia, oropharyngeal phase: Secondary | ICD-10-CM | POA: Diagnosis not present

## 2016-10-18 DIAGNOSIS — A419 Sepsis, unspecified organism: Secondary | ICD-10-CM | POA: Diagnosis not present

## 2016-10-18 DIAGNOSIS — R6520 Severe sepsis without septic shock: Secondary | ICD-10-CM | POA: Diagnosis not present

## 2016-10-18 DIAGNOSIS — M6281 Muscle weakness (generalized): Secondary | ICD-10-CM | POA: Diagnosis not present

## 2016-10-19 DIAGNOSIS — A419 Sepsis, unspecified organism: Secondary | ICD-10-CM | POA: Diagnosis not present

## 2016-10-19 DIAGNOSIS — L89154 Pressure ulcer of sacral region, stage 4: Secondary | ICD-10-CM | POA: Diagnosis not present

## 2016-10-19 DIAGNOSIS — M6281 Muscle weakness (generalized): Secondary | ICD-10-CM | POA: Diagnosis not present

## 2016-10-19 DIAGNOSIS — L89134 Pressure ulcer of right lower back, stage 4: Secondary | ICD-10-CM | POA: Diagnosis not present

## 2016-10-19 DIAGNOSIS — L89144 Pressure ulcer of left lower back, stage 4: Secondary | ICD-10-CM | POA: Diagnosis not present

## 2016-10-19 DIAGNOSIS — R1312 Dysphagia, oropharyngeal phase: Secondary | ICD-10-CM | POA: Diagnosis not present

## 2016-10-19 DIAGNOSIS — G35 Multiple sclerosis: Secondary | ICD-10-CM | POA: Diagnosis not present

## 2016-10-19 DIAGNOSIS — R6520 Severe sepsis without septic shock: Secondary | ICD-10-CM | POA: Diagnosis not present

## 2016-10-20 DIAGNOSIS — R1312 Dysphagia, oropharyngeal phase: Secondary | ICD-10-CM | POA: Diagnosis not present

## 2016-10-20 DIAGNOSIS — G35 Multiple sclerosis: Secondary | ICD-10-CM | POA: Diagnosis not present

## 2016-10-20 DIAGNOSIS — M6281 Muscle weakness (generalized): Secondary | ICD-10-CM | POA: Diagnosis not present

## 2016-10-20 DIAGNOSIS — R6520 Severe sepsis without septic shock: Secondary | ICD-10-CM | POA: Diagnosis not present

## 2016-10-20 DIAGNOSIS — A419 Sepsis, unspecified organism: Secondary | ICD-10-CM | POA: Diagnosis not present

## 2016-10-20 DIAGNOSIS — G114 Hereditary spastic paraplegia: Secondary | ICD-10-CM | POA: Diagnosis not present

## 2016-10-21 DIAGNOSIS — A419 Sepsis, unspecified organism: Secondary | ICD-10-CM | POA: Diagnosis not present

## 2016-10-21 DIAGNOSIS — R1312 Dysphagia, oropharyngeal phase: Secondary | ICD-10-CM | POA: Diagnosis not present

## 2016-10-21 DIAGNOSIS — M25512 Pain in left shoulder: Secondary | ICD-10-CM | POA: Diagnosis not present

## 2016-10-21 DIAGNOSIS — R6520 Severe sepsis without septic shock: Secondary | ICD-10-CM | POA: Diagnosis not present

## 2016-10-21 DIAGNOSIS — G35 Multiple sclerosis: Secondary | ICD-10-CM | POA: Diagnosis not present

## 2016-10-21 DIAGNOSIS — M6281 Muscle weakness (generalized): Secondary | ICD-10-CM | POA: Diagnosis not present

## 2016-10-22 DIAGNOSIS — A419 Sepsis, unspecified organism: Secondary | ICD-10-CM | POA: Diagnosis not present

## 2016-10-22 DIAGNOSIS — R6520 Severe sepsis without septic shock: Secondary | ICD-10-CM | POA: Diagnosis not present

## 2016-10-22 DIAGNOSIS — G114 Hereditary spastic paraplegia: Secondary | ICD-10-CM | POA: Diagnosis not present

## 2016-10-22 DIAGNOSIS — R1312 Dysphagia, oropharyngeal phase: Secondary | ICD-10-CM | POA: Diagnosis not present

## 2016-10-22 DIAGNOSIS — M6281 Muscle weakness (generalized): Secondary | ICD-10-CM | POA: Diagnosis not present

## 2016-10-22 DIAGNOSIS — G35 Multiple sclerosis: Secondary | ICD-10-CM | POA: Diagnosis not present

## 2016-10-25 DIAGNOSIS — G35 Multiple sclerosis: Secondary | ICD-10-CM | POA: Diagnosis not present

## 2016-10-25 DIAGNOSIS — M6281 Muscle weakness (generalized): Secondary | ICD-10-CM | POA: Diagnosis not present

## 2016-10-25 DIAGNOSIS — Z79899 Other long term (current) drug therapy: Secondary | ICD-10-CM | POA: Diagnosis not present

## 2016-10-25 DIAGNOSIS — R6520 Severe sepsis without septic shock: Secondary | ICD-10-CM | POA: Diagnosis not present

## 2016-10-25 DIAGNOSIS — A419 Sepsis, unspecified organism: Secondary | ICD-10-CM | POA: Diagnosis not present

## 2016-10-25 DIAGNOSIS — R1312 Dysphagia, oropharyngeal phase: Secondary | ICD-10-CM | POA: Diagnosis not present

## 2016-10-25 LAB — BASIC METABOLIC PANEL
BUN: 10 (ref 4–21)
CREATININE: 0.2 — AB (ref 0.5–1.1)
Glucose: 85
Potassium: 4.6 (ref 3.4–5.3)
Sodium: 142 (ref 137–147)

## 2016-10-25 LAB — HEPATIC FUNCTION PANEL
ALK PHOS: 180 — AB (ref 25–125)
ALT: 14 (ref 7–35)
AST: 17 (ref 13–35)
Bilirubin, Total: 0.2

## 2016-10-26 DIAGNOSIS — L89144 Pressure ulcer of left lower back, stage 4: Secondary | ICD-10-CM | POA: Diagnosis not present

## 2016-10-26 DIAGNOSIS — G114 Hereditary spastic paraplegia: Secondary | ICD-10-CM | POA: Diagnosis not present

## 2016-10-26 DIAGNOSIS — L89134 Pressure ulcer of right lower back, stage 4: Secondary | ICD-10-CM | POA: Diagnosis not present

## 2016-10-26 DIAGNOSIS — M6281 Muscle weakness (generalized): Secondary | ICD-10-CM | POA: Diagnosis not present

## 2016-10-26 DIAGNOSIS — A419 Sepsis, unspecified organism: Secondary | ICD-10-CM | POA: Diagnosis not present

## 2016-10-26 DIAGNOSIS — R1312 Dysphagia, oropharyngeal phase: Secondary | ICD-10-CM | POA: Diagnosis not present

## 2016-10-26 DIAGNOSIS — L89154 Pressure ulcer of sacral region, stage 4: Secondary | ICD-10-CM | POA: Diagnosis not present

## 2016-10-26 DIAGNOSIS — R6520 Severe sepsis without septic shock: Secondary | ICD-10-CM | POA: Diagnosis not present

## 2016-10-26 DIAGNOSIS — G35 Multiple sclerosis: Secondary | ICD-10-CM | POA: Diagnosis not present

## 2016-10-27 ENCOUNTER — Non-Acute Institutional Stay (SKILLED_NURSING_FACILITY): Payer: Medicare Other | Admitting: Adult Health

## 2016-10-27 ENCOUNTER — Encounter: Payer: Self-pay | Admitting: Adult Health

## 2016-10-27 DIAGNOSIS — G8220 Paraplegia, unspecified: Secondary | ICD-10-CM

## 2016-10-27 DIAGNOSIS — M6281 Muscle weakness (generalized): Secondary | ICD-10-CM | POA: Diagnosis not present

## 2016-10-27 DIAGNOSIS — E43 Unspecified severe protein-calorie malnutrition: Secondary | ICD-10-CM

## 2016-10-27 DIAGNOSIS — G35 Multiple sclerosis: Secondary | ICD-10-CM

## 2016-10-27 DIAGNOSIS — A419 Sepsis, unspecified organism: Secondary | ICD-10-CM | POA: Diagnosis not present

## 2016-10-27 DIAGNOSIS — R6520 Severe sepsis without septic shock: Secondary | ICD-10-CM | POA: Diagnosis not present

## 2016-10-27 DIAGNOSIS — L89154 Pressure ulcer of sacral region, stage 4: Secondary | ICD-10-CM | POA: Diagnosis not present

## 2016-10-27 DIAGNOSIS — R1312 Dysphagia, oropharyngeal phase: Secondary | ICD-10-CM | POA: Diagnosis not present

## 2016-10-27 NOTE — Progress Notes (Signed)
Location:   [facility] Room Number: 215 A Place of Service:  SNF (31)   CODE STATUS: Full Code  No Known Allergies  Chief Complaint  Patient presents with  . Medical Management of Chronic Issues    MS; spastic paraplegia; decubes; malnutrition     HPI:  She is a 41 year old long term resident of this facility being seen for the management of her chronic illnesses: MS paraplegia; decubiti; and malnutrition. She does eat well; she does require assistance with eating. Her weight continues to be an issue for her. She is not complaining of pain; no vertigo; and no insomnia. There are no nursing concerns at this time.    Past Medical History:  Diagnosis Date  . Buttock wound 03/03/2016  . Dysrhythmia    tachycardia  . MS (multiple sclerosis) ([place])   . Neutropenia ([place])   . Pneumonia 02/2016  . Protein calorie malnutrition ([place])   . Severe sepsis ([place]) 03/03/2016  . 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 UTI (urinary tract infection) 02/2016    Past Surgical History:  Procedure Laterality Date  . DIVERTING ILEOSTOMY N/A 09/03/2016   Procedure: ILEAL CONDUIT  URINARY DIVERSION OPEN;  Surgeon: Ardis Hughs, MD;  Location: [facility];  Service: Urology;  Laterality: N/A;  . NO PAST SURGERIES      Social History   Social History  . Marital status: Single    Spouse name: N/A  . Number of children: N/A  . Years of education: N/A   Occupational History  . Disabled    Social History Main Topics  . Smoking status: Former Smoker    Packs/day: 1.00    Years: 10.00    Types: Cigarettes    Quit date: 02/01/2010  . Smokeless tobacco: Never Used  . Alcohol use No  . Drug use: No  . Sexual activity: Not on file   Other Topics Concern  . Not on file   Social History Narrative   She is a resident at [facility].   Right-handed.   Family History  Problem Relation Age of Onset  . Mental illness Sister       VITAL  SIGNS BP 100/70   Pulse 78   Temp 97.6 F (36.4 C)   Resp 16   Ht _0  (1.753 m)   Wt 85 lb (38.6 kg)   LMP 01/28/2016   SpO2 97%   BMI 12.55 kg/m   Patient's Medications  New Prescriptions   No medications on file  Previous Medications   ACETAMINOPHEN (TYLENOL) 325 MG TABLET    Take 650 mg by mouth 3 (three) times daily.    AMINO ACIDS-PROTEIN HYDROLYS (FEEDING SUPPLEMENT, PRO-STAT SUGAR FREE 64,) LIQD    Take 30 mLs by mouth 2 (two) times daily.   BACLOFEN (LIORESAL) 20 MG TABLET    Take 20 mg by mouth 4 (four) times daily.   BISACODYL (DULCOLAX) 10 MG SUPPOSITORY    Place 1 suppository (10 mg total) rectally daily as needed for mild constipation or moderate constipation.   BISACODYL (DULCOLAX) 5 MG EC TABLET    Take 5 mg by mouth daily.   CYANOCOBALAMIN (B-12 PO)    Take 1 tablet by mouth daily.   DICLOFENAC SODIUM (VOLTAREN) 1 % GEL    Apply 2 g topically 3 (three) times daily. Apply to Left shoulder   FERROUS SULFATE 325 (65 FE) MG TABLET    Take 1 tablet (325  mg total) by mouth 2 (two) times daily with a meal.   IBUPROFEN (ADVIL,MOTRIN) 200 MG TABLET    Take 200 mg by mouth 2 (two) times daily.   MIDODRINE (PROAMATINE) 2.5 MG TABLET    Take 1 tablet (2.5 mg total) by mouth 3 (three) times daily with meals.   MIRTAZAPINE (REMERON) 7.5 MG TABLET    Take 7.5 mg by mouth at bedtime.   MULTIPLE VITAMINS-MINERALS (DECUBI-VITE) CAPS    Take 1 capsule by mouth daily.   NUTRITIONAL SUPPLEMENTS (NUTRITIONAL SUPPLEMENT PO)    House  Supplement - Give 120cc by mouth three times daily   ONDANSETRON (ZOFRAN) 4 MG TABLET    Take 4 mg by mouth every 6 (six) hours as needed for nausea or vomiting.   POLYETHYLENE GLYCOL (MIRALAX / GLYCOLAX) PACKET    Take 17 g by mouth daily.   VITAMIN C (ASCORBIC ACID) 500 MG TABLET    Take 500 mg by mouth 2 (two) times daily.  Modified Medications   No medications on file  Discontinued Medications   No medications on file     SIGNIFICANT DIAGNOSTIC  EXAMS  PREVIOUS   03-04-16: swallow study: recommend diet of dysphagia 3 solids, honey thick liquids (no straw), meds whole in puree, and cough/clear throat after every few bites/sips  03-07-16: chest x-ray: Increase left lung opacity concerning for worsening pneumonia or atelectasis with associated pleural effusion.   03-07-16: mri of pelvis: 1. Cellulitis and diffuse myofasciitis involving the pelvic and hip musculature. There also or rim enhancing fluid collections in the gluteus maximus muscles bilaterally concerning for pyomyositis. 2. No findings to suggest septic arthritis or osteomyelitis. 3. Shallow sacral decubitus ulcer but no underlying sacral osteomyelitis. 4. Markedly distended bladder.  03-07-16: mri of lumbar: 1. Negative for lumbar spine infection or impingement. 2. There is non organized edema in the subcutaneous fat. Negative for abscess. 3. Urinary retention causing bilateral hydroureteronephrosis, also seen by CT in 2010.   03-12-16: bone marrow biopsy: Successful CT guided left iliac bone marrow aspiration and core biopsy. Note, an additional sample was set aside for Gram stain and culture analysis.  04-05-16: transvaginal and pelvic ultrasound: 1. Nonvisualization of the left ovary   2. Otherwise negative pelvic ultrasound  04-05-16: renal ultrasound: Mild right hydronephrosis.  04-05-16: chest x-ray: No evidence of pneumonia.  04-07-16: ct angio of chest: 1. No acute pulmonary embolus. 2. Tiny tree-in-bud densities in the right upper and lower lobes consistent with bronchiolitis. 3. 5 mm or less pulmonary nodular densities are also seen within the right upper and lower lobes possibly representing subpleural and perifissural lymph nodes. No follow-up needed if patient is low-risk (and has no known or suspected primary neoplasm). Non-contrast chest CT can be considered in 12 months if patient is high-risk.    06-03-16: TEE: - The patient was in sinus tachycardia. Normal LV size with EF  45-50%, diffuse hypokinesis. Normal RV size with mildly decreased systolic function. No significant valvular abnormalities.  07-15-16: mri of brain: This MRI of the brain with and without contrast shows the following: 1.   Multiple infratentorial and hemispheric T2/FLAIR hyperintense foci consistent with chronic demyelinating plaque associated with multiple sclerosis. None of the foci appeared to be acute. 2.   Moderate cortical atrophy and corpus callosum atrophy with mild brainstem atrophy. 3.   There are no acute findings.  07-15-16: mri cervical spine: This MRI of the cervical spine without contrast shows the following: 1.   Multiple T2 hyperintense  foci within the spinal cord as detailed above.   IV access was not obtainable for contrast. 2.   Mild disc bulges at C3-C4, C4-C5 and C5-C6 but do not lead to any nerve root compression or central canal narrowing.    08-31-16: tee: - Left ventricle: The cavity size was normal. Systolic function was normal. The estimated ejection fraction was in the range of 55% to 60%. Wall motion was normal; there were no regional wall motion abnormalities. Left ventricular diastolic function parameters were normal. - Mitral valve: Systolic bowing without prolapse  08-31-16: right upper quad ultrasound: Normal RIGHT upper quadrant ultrasound.  08-31-16: kub: Mild chronic bronchitic changes, stable. No pneumonia, CHF, nor other acute cardiopulmonary abnormality.   08-31-16: ct angio of chest: 1.  No demonstrable pulmonary embolus. 2.  Atelectasis with probable early pneumonia lateral left base. 3. Mild central peribronchial thickening. Suspect chronic bronchitis. 4.  Small pleural effusions bilaterally. 5.  No demonstrable adenopathy.   09-05-16: kub: 1. Postoperative changes. 2. Ureteral stents. 3. Hiatal hernia. 4. Nonobstructive bowel gas pattern.  NO NEW EXAMS     LABS REVIEWED:PREVIOUS   03-02-16: wbc 13.2; hgb 11.7; hct 37.8; mcv 92.0; plt 526; glucose  163; bun 12; creat 0.77; k+ 3.7; na++ 141; ast 113; alt 55; alk phos 118; total bili 1.7; albumin 2.2  03-05-16: wbc 2.9; hgb 8.8; hct 29.4; mcv 93.3; plt 360; glucose 110; bun 13; creat 0.87; k+ 3.5 ;na++ 158; mag 2.3 03-07-16: wbc 1.8; hgb 9.3; hct 31.0; mcv 92.3; plt 299; glucose 112; bun 9; creat 0.99; k+ 3.2 ;na++ 143; mag 2.0; urine culture: e-coli; blood cultures: neg; HIV nr 03-12-16: wbc 2.6; hgb 8.7; hct 29.0; mcv 89.8; plt 392; glucose 104; bun <5; creat 0.47; k+ 3.6; na++ 141 03-15-16: wbc 24.2; hgb 8.7; hct 28.9; mcv 90.6; plt 496; glucose 88; bun 6; creat 0.41; k+ 3.9; na++ 144 mag 1.8  03-19-16: wbc 12.3; hgb 10.7; hct 35.8; mcv 91.7; plt 513; glucose 112; bun 14.2; creat 0.40; k+ 4.7; na++149; alt 64; ast 73; albumin 3.2  04-05-16: wbc 10.9; hgb 10.3; hct 33.0; mcv 89.7; plt 632; glucose 92; bun 10; creat 0.58; k+ 3.7; na++ 141; mag 1.7; urine culture: citrobacter freundii:cipro  04-06-16: wbc 9.0; hgb 8.2; hct 26.7; mcv 91.1; plt 532; glucose 89; bun 9; creat 0.41; k+ 3.2; na++ 143; liver normal albumin 2.1 04-07-16: wbc 7.9 ;hgb 7.0; hct 22.8; mcv 90.5; plt 517;glucose 82; bun 6; creat 0.38; k+ 4.2; na++ 142; iron 14; tibc 174; ferritin 276; folate 10.1; vit B 12: 423; tsh 1.741; d-dimer 3.67  04-08-16: wbc 6.3; hgb 8.4; hct 27.1; mcv 84.4 ;plt 526; glucose 87; bun 6; creat 0.34; k+ 3.8; na++ 139  05-12-16: wbc 9.8; hgb 10.3; hct 32.6; mcv 90.7; plt 497; glucose 92; bun 11.2; creat 0.54; k+ 4.5; na++ 140; liver normal albumin 4.0;  06-23-16: wbc 12.1; hgb 9.4; hct 31.2; mcv 87.3; plt 629; glucose 150; bun 11.6; creat 0.22; k+ 3.9; na++ 139 alt 63; ast 44; alk phos 219; albumin 3.0; vit B 12: 366; folic acid 44.0; pre-albumin 9; tsh 2.28  07-28-16: wbc 9.1; hgb 11.4; hct 37.7; mcv 85.9; plt 715; glucose 111; bun 11; creat 0.35; k+ 4.4; na++ 141; ca 9.7 07-30-16: wbc 8.2; hgb 9.4; hct 31.2; mcv 84.3; plt 529; glucose 99; bun 5; creat <0.3; k+ 4.1; na++ 141; ca 9.1; alk phos 143; albumin 2.6  08-30-16: wbc  12.2; hgb 12.1; hct 39.9; mcv 85.6; plt 603; glucose  106; bun 11; creat 0.40; k+ 4.7; na++ 143; ca 9.8; blood culture X2: MRSA 08-31-16: wbc 9.7; hgb 9.0; hct 29.4; mcv 83.8; plt 4457; glucose 147; bun 15; creat 0.42; k+ 3.8; na++ 138; ca 8.2; ast 60; alt 61; alk phos 134; albumin 2.1; Ddimer: 5.37;blood culture: staph hominis/MRSA 09-01-16: wbc 6.9; hgb 8.3; hct 26.9; mcv 84.3; plt 502; glucose 158; bun <5; creat <0.3; k+ 3.6; na++ 141; ca 8.7 blood culture: no growth  09-03-16: wbc 4.9; hgb 8.0; hct 26.0; mcv 84.7; plt 606; glucose 83; bun 6; creat <0.3; k+ 4.0; na++ 141; ca 9.1; liver normal albumin 3.1; mag 1.9  09-07-16: wbc 5.6; hgb 8.6; hct 28.7; mcv 85.4; plt 592; glucose 93; bun <5; creat <0.3; k+ 3.5; na++ 142; ca 8.7; liver normal albumin 2.6; pre-albumin 9.3; mag 1.8; phos 4.3; trig 138 09-08-16: iron 28; tibc 172; ferritin 205; vit B 12: 261; tsh 4.266 09-10-16: glucose  96; bun 9; creat <0.3; k+ 3.5; na++ 142; ca 8.9; mag 1.8; phos 3.2  09-14-16: wbc 15.3; hgb 10.7; hct 34.9; mcv 88.2 plt 601; glucose 102; bun 10.6; creat 0.35; k+ 3.8; na++ 144; liver normal albumin 3.4  NO NEW LABS    Review of Systems  Constitutional: Negative for malaise/fatigue.  Respiratory: Negative for cough and shortness of breath.   Cardiovascular: Negative for chest pain, palpitations and leg swelling.  Gastrointestinal: Negative for abdominal pain, constipation and heartburn.  Musculoskeletal: Negative for back pain, joint pain and myalgias.  Skin: Negative.        Has sores   Neurological: Negative for dizziness.  Psychiatric/Behavioral: The patient is not nervous/anxious.    Physical Exam  Constitutional: She is oriented to person, place, and time. No distress.  Frail malnurished  Eyes: Conjunctivae are normal.  Neck: Neck supple. No thyromegaly present.  Cardiovascular: Normal rate, regular rhythm and normal heart sounds.   Pulmonary/Chest: Effort normal and breath sounds normal. No respiratory distress.   Abdominal: Soft. Bowel sounds are normal. She exhibits no distension. There is no tenderness.  Genitourinary:  Genitourinary Comments: Has urostomy    Musculoskeletal: She exhibits no edema.  Able to move upper extremities  Lower extremities with contractures    Lymphadenopathy:    She has no cervical adenopathy.  Neurological: She is alert and oriented to person, place, and time.  Skin: Skin is warm and dry. She is not diaphoretic.  Stage IV coccyx 4.3 x 6.7 x 0.1 cm stage IV: left ischium: 10.7 x 3.1 x 0.5 cm Stage IV left lateral foot: 3.5 x 1.3 cm Stage IV right ischium: 2.9 x 2.1 cm  Left posterior heel resolved   Psychiatric: She has a normal mood and affect.     ASSESSMENT/ PLAN:  TODAY:   1. MS: is followed by neurology. Has bilateral lower extremity splints in place. She is allergic to Tysabri;   2.  Spastic paraplegia: will continue baclofen 20 mg four times daily ; uses bilateral splints  3. Sacral decubitus stage IV: will continue treatment per facility protocol; will be followed by wound Dr.  4. Neurogenic bladder;stable ; has bladder neck erosion: has urostomy   5. Protein calorie malnutrition: severe: has been losing weight albumin is 3.4 will continue supplements per facility protocol and will continue prostat 30 cc three times daily  Is on remeron 7.5 mg nightly for appetite her current weight is 85 pounds.  PREVIOUS   6. Constipation: stable  will continue miralax daily and dulcolax 5 mg tab  daily   8. Dysphagia: no further signs of aspiration; will continue current plan of care and will monitor  9. Anemia:  Likely related to chronic disease and iron deficiency: is status post transfusion 04/2016: hgb 10.7  will continue iron twice daily    10. Tachycardia with low blood pressure: b/p 100/70.  Will continue midodrine 2.5 mg three times daily  11. Chronic pain: is stable: will continue tylenol 650 mg three times daily uses voltaren gel 2 gm to left  shoulder three times daily and takes motrin 200 mg twice daily    Ok Edwards NP [facility]  Contact [phone] Monday through Friday 8am- 5pm  After hours call [phone]

## 2016-10-28 ENCOUNTER — Encounter: Payer: Self-pay | Admitting: Adult Health

## 2016-10-28 DIAGNOSIS — G35 Multiple sclerosis: Secondary | ICD-10-CM | POA: Diagnosis not present

## 2016-10-28 DIAGNOSIS — A419 Sepsis, unspecified organism: Secondary | ICD-10-CM | POA: Diagnosis not present

## 2016-10-28 DIAGNOSIS — R6520 Severe sepsis without septic shock: Secondary | ICD-10-CM | POA: Diagnosis not present

## 2016-10-28 DIAGNOSIS — R1312 Dysphagia, oropharyngeal phase: Secondary | ICD-10-CM | POA: Diagnosis not present

## 2016-10-28 DIAGNOSIS — M6281 Muscle weakness (generalized): Secondary | ICD-10-CM | POA: Diagnosis not present

## 2016-10-29 DIAGNOSIS — G35 Multiple sclerosis: Secondary | ICD-10-CM | POA: Diagnosis not present

## 2016-10-29 DIAGNOSIS — A419 Sepsis, unspecified organism: Secondary | ICD-10-CM | POA: Diagnosis not present

## 2016-10-29 DIAGNOSIS — M6281 Muscle weakness (generalized): Secondary | ICD-10-CM | POA: Diagnosis not present

## 2016-10-29 DIAGNOSIS — R1312 Dysphagia, oropharyngeal phase: Secondary | ICD-10-CM | POA: Diagnosis not present

## 2016-10-29 DIAGNOSIS — R6520 Severe sepsis without septic shock: Secondary | ICD-10-CM | POA: Diagnosis not present

## 2016-10-30 DIAGNOSIS — R6520 Severe sepsis without septic shock: Secondary | ICD-10-CM | POA: Diagnosis not present

## 2016-10-30 DIAGNOSIS — M6281 Muscle weakness (generalized): Secondary | ICD-10-CM | POA: Diagnosis not present

## 2016-10-30 DIAGNOSIS — G35 Multiple sclerosis: Secondary | ICD-10-CM | POA: Diagnosis not present

## 2016-10-30 DIAGNOSIS — A419 Sepsis, unspecified organism: Secondary | ICD-10-CM | POA: Diagnosis not present

## 2016-10-30 DIAGNOSIS — R1312 Dysphagia, oropharyngeal phase: Secondary | ICD-10-CM | POA: Diagnosis not present

## 2016-11-01 ENCOUNTER — Encounter: Payer: Self-pay | Admitting: Adult Health

## 2016-11-01 ENCOUNTER — Non-Acute Institutional Stay (SKILLED_NURSING_FACILITY): Payer: Medicare Other | Admitting: Adult Health

## 2016-11-01 DIAGNOSIS — R634 Abnormal weight loss: Secondary | ICD-10-CM

## 2016-11-01 DIAGNOSIS — G35 Multiple sclerosis: Secondary | ICD-10-CM | POA: Diagnosis not present

## 2016-11-01 DIAGNOSIS — A419 Sepsis, unspecified organism: Secondary | ICD-10-CM | POA: Diagnosis not present

## 2016-11-01 DIAGNOSIS — E43 Unspecified severe protein-calorie malnutrition: Secondary | ICD-10-CM

## 2016-11-01 DIAGNOSIS — R6520 Severe sepsis without septic shock: Secondary | ICD-10-CM | POA: Diagnosis not present

## 2016-11-01 DIAGNOSIS — M6281 Muscle weakness (generalized): Secondary | ICD-10-CM | POA: Diagnosis not present

## 2016-11-01 DIAGNOSIS — R1312 Dysphagia, oropharyngeal phase: Secondary | ICD-10-CM | POA: Diagnosis not present

## 2016-11-01 NOTE — Progress Notes (Signed)
Location:   [facility] Room Number: 215 A Place of Service:  SNF (31)   CODE STATUS: Full Code  No Known Allergies  Chief Complaint  Patient presents with  . Acute Visit    Weight Loss    HPI:  She has been slowly losing weight to her current weight of 81.5 pounds. Her weight has been in the 80's over the past several months. She has had hospitalizations over this past year; with the last being for an ileo conduit. She has MS and has multiple ulcerations; both of which can burn excessive calories. Her continued low body weight; makes her extremity susceptible for further skin break down. She is eating 100% of her meals; she does eat snacks as well. She denies any nausea; vomiting; and no complaints of loss of food. She does not like th med pass. She does not want a feeding tube.    Past Medical History:  Diagnosis Date  . Buttock wound 03/03/2016  . Dysrhythmia    tachycardia  . MS (multiple sclerosis) ([place])   . Neutropenia ([place])   . Pneumonia 02/2016  . Protein calorie malnutrition ([place])   . Severe sepsis ([place]) 03/03/2016  . 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 UTI (urinary tract infection) 02/2016    Past Surgical History:  Procedure Laterality Date  . DIVERTING ILEOSTOMY N/A 09/03/2016   Procedure: ILEAL CONDUIT  URINARY DIVERSION OPEN;  Surgeon: Ardis Hughs, MD;  Location: [facility];  Service: Urology;  Laterality: N/A;  . NO PAST SURGERIES      Social History   Social History  . Marital status: Single    Spouse name: N/A  . Number of children: N/A  . Years of education: N/A   Occupational History  . Disabled    Social History Main Topics  . Smoking status: Former Smoker    Packs/day: 1.00    Years: 10.00    Types: Cigarettes    Quit date: 02/01/2010  . Smokeless tobacco: Never Used  . Alcohol use No  . Drug use: No  . Sexual activity: Not on file   Other Topics Concern  . Not on file   Social History  Narrative   She is a resident at [facility].   Right-handed.   Family History  Problem Relation Age of Onset  . Mental illness Sister       VITAL SIGNS BP 100/70   Pulse 78   Temp 97.6 F (36.4 C)   Resp 16   Ht '5\' 9"'$  (1.753 m)   Wt 81 lb 8 oz (37 kg)   LMP 01/28/2016   SpO2 97%   BMI 12.04 kg/m   Patient's Medications  New Prescriptions   No medications on file  Previous Medications   ACETAMINOPHEN (TYLENOL) 325 MG TABLET    Take 650 mg by mouth 3 (three) times daily.    AMINO ACIDS-PROTEIN HYDROLYS (FEEDING SUPPLEMENT, PRO-STAT SUGAR FREE 64,) LIQD    Take 30 mLs by mouth 2 (two) times daily.   BACLOFEN (LIORESAL) 20 MG TABLET    Take 20 mg by mouth 4 (four) times daily.   BISACODYL (DULCOLAX) 10 MG SUPPOSITORY    Place 1 suppository (10 mg total) rectally daily as needed for mild constipation or moderate constipation.   BISACODYL (DULCOLAX) 5 MG EC TABLET    Take 5 mg by mouth daily.   CYANOCOBALAMIN (B-12 PO)    Take 1 tablet by mouth daily.  DICLOFENAC SODIUM (VOLTAREN) 1 % GEL    Apply 2 g topically 3 (three) times daily. Apply to Left shoulder   FERROUS SULFATE 325 (65 FE) MG TABLET    Take 1 tablet (325 mg total) by mouth 2 (two) times daily with a meal.   IBUPROFEN (ADVIL,MOTRIN) 200 MG TABLET    Take 200 mg by mouth 2 (two) times daily.   MIDODRINE (PROAMATINE) 2.5 MG TABLET    Take 1 tablet (2.5 mg total) by mouth 3 (three) times daily with meals.   MIRTAZAPINE (REMERON) 7.5 MG TABLET    Take 7.5 mg by mouth at bedtime.   MULTIPLE VITAMINS-MINERALS (DECUBI-VITE) CAPS    Take 1 capsule by mouth daily.   NUTRITIONAL SUPPLEMENTS (NUTRITIONAL SUPPLEMENT PO)    House  Supplement - Give 120cc by mouth three times daily   ONDANSETRON (ZOFRAN) 4 MG TABLET    Take 4 mg by mouth every 6 (six) hours as needed for nausea or vomiting.   POLYETHYLENE GLYCOL (MIRALAX / GLYCOLAX) PACKET    Take 17 g by mouth daily.   VITAMIN C (ASCORBIC ACID) 500 MG TABLET    Take  500 mg by mouth 2 (two) times daily.  Modified Medications   No medications on file  Discontinued Medications   No medications on file     SIGNIFICANT DIAGNOSTIC EXAMS  PREVIOUS   03-04-16: swallow study: recommend diet of dysphagia 3 solids, honey thick liquids (no straw), meds whole in puree, and cough/clear throat after every few bites/sips  03-07-16: chest x-ray: Increase left lung opacity concerning for worsening pneumonia or atelectasis with associated pleural effusion.   03-07-16: mri of pelvis: 1. Cellulitis and diffuse myofasciitis involving the pelvic and hip musculature. There also or rim enhancing fluid collections in the gluteus maximus muscles bilaterally concerning for pyomyositis. 2. No findings to suggest septic arthritis or osteomyelitis. 3. Shallow sacral decubitus ulcer but no underlying sacral osteomyelitis. 4. Markedly distended bladder.  03-07-16: mri of lumbar: 1. Negative for lumbar spine infection or impingement. 2. There is non organized edema in the subcutaneous fat. Negative for abscess. 3. Urinary retention causing bilateral hydroureteronephrosis, also seen by CT in 2010.   03-12-16: bone marrow biopsy: Successful CT guided left iliac bone marrow aspiration and core biopsy. Note, an additional sample was set aside for Gram stain and culture analysis.  04-05-16: transvaginal and pelvic ultrasound: 1. Nonvisualization of the left ovary   2. Otherwise negative pelvic ultrasound  04-05-16: renal ultrasound: Mild right hydronephrosis.  04-05-16: chest x-ray: No evidence of pneumonia.  04-07-16: ct angio of chest: 1. No acute pulmonary embolus. 2. Tiny tree-in-bud densities in the right upper and lower lobes consistent with bronchiolitis. 3. 5 mm or less pulmonary nodular densities are also seen within the right upper and lower lobes possibly representing subpleural and perifissural lymph nodes. No follow-up needed if patient is low-risk (and has no known or suspected primary  neoplasm). Non-contrast chest CT can be considered in 12 months if patient is high-risk.    06-03-16: TEE: - The patient was in sinus tachycardia. Normal LV size with EF 45-50%, diffuse hypokinesis. Normal RV size with mildly decreased systolic function. No significant valvular abnormalities.  07-15-16: mri of brain: This MRI of the brain with and without contrast shows the following: 1.   Multiple infratentorial and hemispheric T2/FLAIR hyperintense foci consistent with chronic demyelinating plaque associated with multiple sclerosis. None of the foci appeared to be acute. 2.   Moderate cortical atrophy and  corpus callosum atrophy with mild brainstem atrophy. 3.   There are no acute findings.  07-15-16: mri cervical spine: This MRI of the cervical spine without contrast shows the following: 1.   Multiple T2 hyperintense foci within the spinal cord as detailed above.   IV access was not obtainable for contrast. 2.   Mild disc bulges at C3-C4, C4-C5 and C5-C6 but do not lead to any nerve root compression or central canal narrowing.    08-31-16: tee: - Left ventricle: The cavity size was normal. Systolic function was normal. The estimated ejection fraction was in the range of 55% to 60%. Wall motion was normal; there were no regional wall motion abnormalities. Left ventricular diastolic function parameters were normal. - Mitral valve: Systolic bowing without prolapse  08-31-16: right upper quad ultrasound: Normal RIGHT upper quadrant ultrasound.  08-31-16: kub: Mild chronic bronchitic changes, stable. No pneumonia, CHF, nor other acute cardiopulmonary abnormality.   08-31-16: ct angio of chest: 1.  No demonstrable pulmonary embolus. 2.  Atelectasis with probable early pneumonia lateral left base. 3. Mild central peribronchial thickening. Suspect chronic bronchitis. 4.  Small pleural effusions bilaterally. 5.  No demonstrable adenopathy.   09-05-16: kub: 1. Postoperative changes. 2. Ureteral stents. 3.  Hiatal hernia. 4. Nonobstructive bowel gas pattern.  TODAY:   10-21-16: left shoulder x-ray: no acute osseous abnormality     LABS REVIEWED:PREVIOUS   03-02-16: wbc 13.2; hgb 11.7; hct 37.8; mcv 92.0; plt 526; glucose 163; bun 12; creat 0.77; k+ 3.7; na++ 141; ast 113; alt 55; alk phos 118; total bili 1.7; albumin 2.2  03-05-16: wbc 2.9; hgb 8.8; hct 29.4; mcv 93.3; plt 360; glucose 110; bun 13; creat 0.87; k+ 3.5 ;na++ 158; mag 2.3 03-07-16: wbc 1.8; hgb 9.3; hct 31.0; mcv 92.3; plt 299; glucose 112; bun 9; creat 0.99; k+ 3.2 ;na++ 143; mag 2.0; urine culture: e-coli; blood cultures: neg; HIV nr 03-12-16: wbc 2.6; hgb 8.7; hct 29.0; mcv 89.8; plt 392; glucose 104; bun <5; creat 0.47; k+ 3.6; na++ 141 03-15-16: wbc 24.2; hgb 8.7; hct 28.9; mcv 90.6; plt 496; glucose 88; bun 6; creat 0.41; k+ 3.9; na++ 144 mag 1.8  03-19-16: wbc 12.3; hgb 10.7; hct 35.8; mcv 91.7; plt 513; glucose 112; bun 14.2; creat 0.40; k+ 4.7; na++149; alt 64; ast 73; albumin 3.2  04-05-16: wbc 10.9; hgb 10.3; hct 33.0; mcv 89.7; plt 632; glucose 92; bun 10; creat 0.58; k+ 3.7; na++ 141; mag 1.7; urine culture: citrobacter freundii:cipro  04-06-16: wbc 9.0; hgb 8.2; hct 26.7; mcv 91.1; plt 532; glucose 89; bun 9; creat 0.41; k+ 3.2; na++ 143; liver normal albumin 2.1 04-07-16: wbc 7.9 ;hgb 7.0; hct 22.8; mcv 90.5; plt 517;glucose 82; bun 6; creat 0.38; k+ 4.2; na++ 142; iron 14; tibc 174; ferritin 276; folate 10.1; vit B 12: 423; tsh 1.741; d-dimer 3.67  04-08-16: wbc 6.3; hgb 8.4; hct 27.1; mcv 84.4 ;plt 526; glucose 87; bun 6; creat 0.34; k+ 3.8; na++ 139  05-12-16: wbc 9.8; hgb 10.3; hct 32.6; mcv 90.7; plt 497; glucose 92; bun 11.2; creat 0.54; k+ 4.5; na++ 140; liver normal albumin 4.0;  06-23-16: wbc 12.1; hgb 9.4; hct 31.2; mcv 87.3; plt 629; glucose 150; bun 11.6; creat 0.22; k+ 3.9; na++ 139 alt 63; ast 44; alk phos 219; albumin 3.0; vit B 12: 144; folic acid 31.5; pre-albumin 9; tsh 2.28  07-28-16: wbc 9.1; hgb 11.4; hct 37.7; mcv  85.9; plt 715; glucose 111; bun 11; creat 0.35; k+ 4.4;  na++ 141; ca 9.7 07-30-16: wbc 8.2; hgb 9.4; hct 31.2; mcv 84.3; plt 529; glucose 99; bun 5; creat <0.3; k+ 4.1; na++ 141; ca 9.1; alk phos 143; albumin 2.6  08-30-16: wbc 12.2; hgb 12.1; hct 39.9; mcv 85.6; plt 603; glucose 106; bun 11; creat 0.40; k+ 4.7; na++ 143; ca 9.8; blood culture X2: MRSA 08-31-16: wbc 9.7; hgb 9.0; hct 29.4; mcv 83.8; plt 4457; glucose 147; bun 15; creat 0.42; k+ 3.8; na++ 138; ca 8.2; ast 60; alt 61; alk phos 134; albumin 2.1; Ddimer: 5.37;blood culture: staph hominis/MRSA 09-01-16: wbc 6.9; hgb 8.3; hct 26.9; mcv 84.3; plt 502; glucose 158; bun <5; creat <0.3; k+ 3.6; na++ 141; ca 8.7 blood culture: no growth  09-03-16: wbc 4.9; hgb 8.0; hct 26.0; mcv 84.7; plt 606; glucose 83; bun 6; creat <0.3; k+ 4.0; na++ 141; ca 9.1; liver normal albumin 3.1; mag 1.9  09-07-16: wbc 5.6; hgb 8.6; hct 28.7; mcv 85.4; plt 592; glucose 93; bun <5; creat <0.3; k+ 3.5; na++ 142; ca 8.7; liver normal albumin 2.6; pre-albumin 9.3; mag 1.8; phos 4.3; trig 138 09-08-16: iron 28; tibc 172; ferritin 205; vit B 12: 261; tsh 4.266 09-10-16: glucose  96; bun 9; creat <0.3; k+ 3.5; na++ 142; ca 8.9; mag 1.8; phos 3.2  09-14-16: wbc 15.3; hgb 10.7; hct 34.9; mcv 88.2 plt 601; glucose 102; bun 10.6; creat 0.35; k+ 3.8; na++ 144; liver normal albumin 3.4  NO NEW LABS    Review of Systems  Constitutional: Negative for malaise/fatigue.  Respiratory: Negative for cough and shortness of breath.   Cardiovascular: Negative for chest pain, palpitations and leg swelling.  Gastrointestinal: Negative for abdominal pain, constipation and heartburn.  Musculoskeletal: Negative for back pain, joint pain and myalgias.  Skin:       Has sores   Neurological: Negative for dizziness.  Psychiatric/Behavioral: The patient is not nervous/anxious.     Physical Exam  Constitutional: She is oriented to person, place, and time. No distress.  Cachexia   Neck: Normal range of  motion. Neck supple. No thyromegaly present.  Cardiovascular: Normal rate, regular rhythm, normal heart sounds and intact distal pulses.   Pulmonary/Chest: Effort normal and breath sounds normal. No respiratory distress.  Abdominal: Soft. Bowel sounds are normal. She exhibits no distension. There is no tenderness.  Genitourinary:  Genitourinary Comments: Has urostomy   Musculoskeletal:  Abe to move upper extremities; lower extremities with contractures   Lymphadenopathy:    She has no cervical adenopathy.  Neurological: She is alert and oriented to person, place, and time.  Skin: Skin is warm and dry. She is not diaphoretic.  Stage IV coccyx 4.3 x 6.7 x 0.1 cm stage IV: left ischium: 10.7 x 3.1 x 0.5 cm Stage IV left lateral foot: 3.5 x 1.3 cm Stage IV right ischium: 2.9 x 2.1 cm    Psychiatric: She has a normal mood and affect.   ASSESSMENT/ PLAN:  TODAY:   1 Protein calorie malnutrition: severe:  Weight change without change  albumin is 3.4 will continue supplements per facility protocol and will continue prostat 30 cc three times daily  Is on remeron 7.5 mg nightly for appetite her current weight is 81.5 pounds. Her weight remains in the 80 pound range; she is prone to weight loss due to her diease processes   MD is aware of resident's narcotic use and is in agreement with current plan of care. We will attempt to wean resident as apropriate  Ok Edwards NP [facility]  Contact [phone] Monday through Friday 8am- 5pm  After hours call [phone]

## 2016-11-02 DIAGNOSIS — L89134 Pressure ulcer of right lower back, stage 4: Secondary | ICD-10-CM | POA: Diagnosis not present

## 2016-11-02 DIAGNOSIS — I10 Essential (primary) hypertension: Secondary | ICD-10-CM | POA: Diagnosis not present

## 2016-11-02 DIAGNOSIS — R6520 Severe sepsis without septic shock: Secondary | ICD-10-CM | POA: Diagnosis not present

## 2016-11-02 DIAGNOSIS — G35 Multiple sclerosis: Secondary | ICD-10-CM | POA: Diagnosis not present

## 2016-11-02 DIAGNOSIS — L89894 Pressure ulcer of other site, stage 4: Secondary | ICD-10-CM | POA: Diagnosis not present

## 2016-11-02 DIAGNOSIS — M6281 Muscle weakness (generalized): Secondary | ICD-10-CM | POA: Diagnosis not present

## 2016-11-02 DIAGNOSIS — L89154 Pressure ulcer of sacral region, stage 4: Secondary | ICD-10-CM | POA: Diagnosis not present

## 2016-11-02 DIAGNOSIS — A419 Sepsis, unspecified organism: Secondary | ICD-10-CM | POA: Diagnosis not present

## 2016-11-02 DIAGNOSIS — R1312 Dysphagia, oropharyngeal phase: Secondary | ICD-10-CM | POA: Diagnosis not present

## 2016-11-03 DIAGNOSIS — G35 Multiple sclerosis: Secondary | ICD-10-CM | POA: Diagnosis not present

## 2016-11-03 DIAGNOSIS — A419 Sepsis, unspecified organism: Secondary | ICD-10-CM | POA: Diagnosis not present

## 2016-11-03 DIAGNOSIS — G114 Hereditary spastic paraplegia: Secondary | ICD-10-CM | POA: Diagnosis not present

## 2016-11-03 DIAGNOSIS — R6520 Severe sepsis without septic shock: Secondary | ICD-10-CM | POA: Diagnosis not present

## 2016-11-03 DIAGNOSIS — R1312 Dysphagia, oropharyngeal phase: Secondary | ICD-10-CM | POA: Diagnosis not present

## 2016-11-03 DIAGNOSIS — M6281 Muscle weakness (generalized): Secondary | ICD-10-CM | POA: Diagnosis not present

## 2016-11-04 DIAGNOSIS — M6281 Muscle weakness (generalized): Secondary | ICD-10-CM | POA: Diagnosis not present

## 2016-11-04 DIAGNOSIS — R6520 Severe sepsis without septic shock: Secondary | ICD-10-CM | POA: Diagnosis not present

## 2016-11-04 DIAGNOSIS — R1312 Dysphagia, oropharyngeal phase: Secondary | ICD-10-CM | POA: Diagnosis not present

## 2016-11-04 DIAGNOSIS — A419 Sepsis, unspecified organism: Secondary | ICD-10-CM | POA: Diagnosis not present

## 2016-11-04 DIAGNOSIS — G35 Multiple sclerosis: Secondary | ICD-10-CM | POA: Diagnosis not present

## 2016-11-05 DIAGNOSIS — A419 Sepsis, unspecified organism: Secondary | ICD-10-CM | POA: Diagnosis not present

## 2016-11-05 DIAGNOSIS — G35 Multiple sclerosis: Secondary | ICD-10-CM | POA: Diagnosis not present

## 2016-11-05 DIAGNOSIS — M6281 Muscle weakness (generalized): Secondary | ICD-10-CM | POA: Diagnosis not present

## 2016-11-05 DIAGNOSIS — R1312 Dysphagia, oropharyngeal phase: Secondary | ICD-10-CM | POA: Diagnosis not present

## 2016-11-05 DIAGNOSIS — R6520 Severe sepsis without septic shock: Secondary | ICD-10-CM | POA: Diagnosis not present

## 2016-11-06 DIAGNOSIS — R6520 Severe sepsis without septic shock: Secondary | ICD-10-CM | POA: Diagnosis not present

## 2016-11-06 DIAGNOSIS — M6281 Muscle weakness (generalized): Secondary | ICD-10-CM | POA: Diagnosis not present

## 2016-11-06 DIAGNOSIS — G35 Multiple sclerosis: Secondary | ICD-10-CM | POA: Diagnosis not present

## 2016-11-06 DIAGNOSIS — A419 Sepsis, unspecified organism: Secondary | ICD-10-CM | POA: Diagnosis not present

## 2016-11-06 DIAGNOSIS — R1312 Dysphagia, oropharyngeal phase: Secondary | ICD-10-CM | POA: Diagnosis not present

## 2016-11-08 DIAGNOSIS — R1312 Dysphagia, oropharyngeal phase: Secondary | ICD-10-CM | POA: Diagnosis not present

## 2016-11-08 DIAGNOSIS — R6520 Severe sepsis without septic shock: Secondary | ICD-10-CM | POA: Diagnosis not present

## 2016-11-08 DIAGNOSIS — G35 Multiple sclerosis: Secondary | ICD-10-CM | POA: Diagnosis not present

## 2016-11-08 DIAGNOSIS — M6281 Muscle weakness (generalized): Secondary | ICD-10-CM | POA: Diagnosis not present

## 2016-11-08 DIAGNOSIS — A419 Sepsis, unspecified organism: Secondary | ICD-10-CM | POA: Diagnosis not present

## 2016-11-09 DIAGNOSIS — L89154 Pressure ulcer of sacral region, stage 4: Secondary | ICD-10-CM | POA: Diagnosis not present

## 2016-11-09 DIAGNOSIS — R1312 Dysphagia, oropharyngeal phase: Secondary | ICD-10-CM | POA: Diagnosis not present

## 2016-11-09 DIAGNOSIS — L89134 Pressure ulcer of right lower back, stage 4: Secondary | ICD-10-CM | POA: Diagnosis not present

## 2016-11-09 DIAGNOSIS — L89144 Pressure ulcer of left lower back, stage 4: Secondary | ICD-10-CM | POA: Diagnosis not present

## 2016-11-09 DIAGNOSIS — A419 Sepsis, unspecified organism: Secondary | ICD-10-CM | POA: Diagnosis not present

## 2016-11-09 DIAGNOSIS — R6520 Severe sepsis without septic shock: Secondary | ICD-10-CM | POA: Diagnosis not present

## 2016-11-09 DIAGNOSIS — M6281 Muscle weakness (generalized): Secondary | ICD-10-CM | POA: Diagnosis not present

## 2016-11-09 DIAGNOSIS — G35 Multiple sclerosis: Secondary | ICD-10-CM | POA: Diagnosis not present

## 2016-11-10 DIAGNOSIS — G35 Multiple sclerosis: Secondary | ICD-10-CM | POA: Diagnosis not present

## 2016-11-10 DIAGNOSIS — R1312 Dysphagia, oropharyngeal phase: Secondary | ICD-10-CM | POA: Diagnosis not present

## 2016-11-10 DIAGNOSIS — M6281 Muscle weakness (generalized): Secondary | ICD-10-CM | POA: Diagnosis not present

## 2016-11-10 DIAGNOSIS — R6520 Severe sepsis without septic shock: Secondary | ICD-10-CM | POA: Diagnosis not present

## 2016-11-10 DIAGNOSIS — A419 Sepsis, unspecified organism: Secondary | ICD-10-CM | POA: Diagnosis not present

## 2016-11-11 DIAGNOSIS — G35 Multiple sclerosis: Secondary | ICD-10-CM | POA: Diagnosis not present

## 2016-11-11 DIAGNOSIS — A419 Sepsis, unspecified organism: Secondary | ICD-10-CM | POA: Diagnosis not present

## 2016-11-11 DIAGNOSIS — R1312 Dysphagia, oropharyngeal phase: Secondary | ICD-10-CM | POA: Diagnosis not present

## 2016-11-11 DIAGNOSIS — M6281 Muscle weakness (generalized): Secondary | ICD-10-CM | POA: Diagnosis not present

## 2016-11-11 DIAGNOSIS — G114 Hereditary spastic paraplegia: Secondary | ICD-10-CM | POA: Diagnosis not present

## 2016-11-11 DIAGNOSIS — R6520 Severe sepsis without septic shock: Secondary | ICD-10-CM | POA: Diagnosis not present

## 2016-11-12 DIAGNOSIS — R6520 Severe sepsis without septic shock: Secondary | ICD-10-CM | POA: Diagnosis not present

## 2016-11-12 DIAGNOSIS — G35 Multiple sclerosis: Secondary | ICD-10-CM | POA: Diagnosis not present

## 2016-11-12 DIAGNOSIS — A419 Sepsis, unspecified organism: Secondary | ICD-10-CM | POA: Diagnosis not present

## 2016-11-12 DIAGNOSIS — M6281 Muscle weakness (generalized): Secondary | ICD-10-CM | POA: Diagnosis not present

## 2016-11-12 DIAGNOSIS — R1312 Dysphagia, oropharyngeal phase: Secondary | ICD-10-CM | POA: Diagnosis not present

## 2016-11-15 DIAGNOSIS — R6520 Severe sepsis without septic shock: Secondary | ICD-10-CM | POA: Diagnosis not present

## 2016-11-15 DIAGNOSIS — G114 Hereditary spastic paraplegia: Secondary | ICD-10-CM | POA: Diagnosis not present

## 2016-11-15 DIAGNOSIS — M6281 Muscle weakness (generalized): Secondary | ICD-10-CM | POA: Diagnosis not present

## 2016-11-15 DIAGNOSIS — G35 Multiple sclerosis: Secondary | ICD-10-CM | POA: Diagnosis not present

## 2016-11-15 DIAGNOSIS — R1312 Dysphagia, oropharyngeal phase: Secondary | ICD-10-CM | POA: Diagnosis not present

## 2016-11-15 DIAGNOSIS — A419 Sepsis, unspecified organism: Secondary | ICD-10-CM | POA: Diagnosis not present

## 2016-11-16 DIAGNOSIS — A419 Sepsis, unspecified organism: Secondary | ICD-10-CM | POA: Diagnosis not present

## 2016-11-16 DIAGNOSIS — L89134 Pressure ulcer of right lower back, stage 4: Secondary | ICD-10-CM | POA: Diagnosis not present

## 2016-11-16 DIAGNOSIS — G35 Multiple sclerosis: Secondary | ICD-10-CM | POA: Diagnosis not present

## 2016-11-16 DIAGNOSIS — R6520 Severe sepsis without septic shock: Secondary | ICD-10-CM | POA: Diagnosis not present

## 2016-11-16 DIAGNOSIS — R1312 Dysphagia, oropharyngeal phase: Secondary | ICD-10-CM | POA: Diagnosis not present

## 2016-11-16 DIAGNOSIS — L89154 Pressure ulcer of sacral region, stage 4: Secondary | ICD-10-CM | POA: Diagnosis not present

## 2016-11-16 DIAGNOSIS — M6281 Muscle weakness (generalized): Secondary | ICD-10-CM | POA: Diagnosis not present

## 2016-11-16 DIAGNOSIS — L89894 Pressure ulcer of other site, stage 4: Secondary | ICD-10-CM | POA: Diagnosis not present

## 2016-11-17 DIAGNOSIS — D649 Anemia, unspecified: Secondary | ICD-10-CM | POA: Diagnosis not present

## 2016-11-17 DIAGNOSIS — G35 Multiple sclerosis: Secondary | ICD-10-CM | POA: Diagnosis not present

## 2016-11-17 DIAGNOSIS — E7800 Pure hypercholesterolemia, unspecified: Secondary | ICD-10-CM | POA: Diagnosis not present

## 2016-11-17 DIAGNOSIS — I2510 Atherosclerotic heart disease of native coronary artery without angina pectoris: Secondary | ICD-10-CM | POA: Diagnosis not present

## 2016-11-17 DIAGNOSIS — R6520 Severe sepsis without septic shock: Secondary | ICD-10-CM | POA: Diagnosis not present

## 2016-11-17 DIAGNOSIS — A419 Sepsis, unspecified organism: Secondary | ICD-10-CM | POA: Diagnosis not present

## 2016-11-17 DIAGNOSIS — R1312 Dysphagia, oropharyngeal phase: Secondary | ICD-10-CM | POA: Diagnosis not present

## 2016-11-17 DIAGNOSIS — E785 Hyperlipidemia, unspecified: Secondary | ICD-10-CM | POA: Diagnosis not present

## 2016-11-17 DIAGNOSIS — L89524 Pressure ulcer of left ankle, stage 4: Secondary | ICD-10-CM | POA: Diagnosis not present

## 2016-11-17 DIAGNOSIS — Z79899 Other long term (current) drug therapy: Secondary | ICD-10-CM | POA: Diagnosis not present

## 2016-11-17 DIAGNOSIS — M6281 Muscle weakness (generalized): Secondary | ICD-10-CM | POA: Diagnosis not present

## 2016-11-18 DIAGNOSIS — R6520 Severe sepsis without septic shock: Secondary | ICD-10-CM | POA: Diagnosis not present

## 2016-11-18 DIAGNOSIS — R1312 Dysphagia, oropharyngeal phase: Secondary | ICD-10-CM | POA: Diagnosis not present

## 2016-11-18 DIAGNOSIS — A419 Sepsis, unspecified organism: Secondary | ICD-10-CM | POA: Diagnosis not present

## 2016-11-18 DIAGNOSIS — M6281 Muscle weakness (generalized): Secondary | ICD-10-CM | POA: Diagnosis not present

## 2016-11-18 DIAGNOSIS — G35 Multiple sclerosis: Secondary | ICD-10-CM | POA: Diagnosis not present

## 2016-11-19 DIAGNOSIS — R1312 Dysphagia, oropharyngeal phase: Secondary | ICD-10-CM | POA: Diagnosis not present

## 2016-11-19 DIAGNOSIS — M6281 Muscle weakness (generalized): Secondary | ICD-10-CM | POA: Diagnosis not present

## 2016-11-19 DIAGNOSIS — G35 Multiple sclerosis: Secondary | ICD-10-CM | POA: Diagnosis not present

## 2016-11-19 DIAGNOSIS — R6520 Severe sepsis without septic shock: Secondary | ICD-10-CM | POA: Diagnosis not present

## 2016-11-19 DIAGNOSIS — A419 Sepsis, unspecified organism: Secondary | ICD-10-CM | POA: Diagnosis not present

## 2016-11-22 DIAGNOSIS — G35 Multiple sclerosis: Secondary | ICD-10-CM | POA: Diagnosis not present

## 2016-11-22 DIAGNOSIS — M6281 Muscle weakness (generalized): Secondary | ICD-10-CM | POA: Diagnosis not present

## 2016-11-22 DIAGNOSIS — R1312 Dysphagia, oropharyngeal phase: Secondary | ICD-10-CM | POA: Diagnosis not present

## 2016-11-22 DIAGNOSIS — R6520 Severe sepsis without septic shock: Secondary | ICD-10-CM | POA: Diagnosis not present

## 2016-11-22 DIAGNOSIS — A419 Sepsis, unspecified organism: Secondary | ICD-10-CM | POA: Diagnosis not present

## 2016-11-23 ENCOUNTER — Encounter: Payer: Self-pay | Admitting: Adult Health

## 2016-11-23 ENCOUNTER — Non-Acute Institutional Stay (SKILLED_NURSING_FACILITY): Payer: Medicare Other | Admitting: Adult Health

## 2016-11-23 DIAGNOSIS — G8929 Other chronic pain: Secondary | ICD-10-CM

## 2016-11-23 DIAGNOSIS — D649 Anemia, unspecified: Secondary | ICD-10-CM | POA: Diagnosis not present

## 2016-11-23 DIAGNOSIS — L89144 Pressure ulcer of left lower back, stage 4: Secondary | ICD-10-CM | POA: Diagnosis not present

## 2016-11-23 DIAGNOSIS — R000 Tachycardia, unspecified: Secondary | ICD-10-CM | POA: Diagnosis not present

## 2016-11-23 DIAGNOSIS — R6520 Severe sepsis without septic shock: Secondary | ICD-10-CM | POA: Diagnosis not present

## 2016-11-23 DIAGNOSIS — A419 Sepsis, unspecified organism: Secondary | ICD-10-CM | POA: Diagnosis not present

## 2016-11-23 DIAGNOSIS — M6281 Muscle weakness (generalized): Secondary | ICD-10-CM | POA: Diagnosis not present

## 2016-11-23 DIAGNOSIS — D638 Anemia in other chronic diseases classified elsewhere: Secondary | ICD-10-CM | POA: Diagnosis not present

## 2016-11-23 DIAGNOSIS — L89894 Pressure ulcer of other site, stage 4: Secondary | ICD-10-CM | POA: Diagnosis not present

## 2016-11-23 DIAGNOSIS — G35 Multiple sclerosis: Secondary | ICD-10-CM | POA: Diagnosis not present

## 2016-11-23 DIAGNOSIS — R1312 Dysphagia, oropharyngeal phase: Secondary | ICD-10-CM | POA: Diagnosis not present

## 2016-11-23 DIAGNOSIS — R1311 Dysphagia, oral phase: Secondary | ICD-10-CM

## 2016-11-23 DIAGNOSIS — L89134 Pressure ulcer of right lower back, stage 4: Secondary | ICD-10-CM | POA: Diagnosis not present

## 2016-11-23 DIAGNOSIS — M2550 Pain in unspecified joint: Secondary | ICD-10-CM | POA: Diagnosis not present

## 2016-11-23 LAB — POCT ERYTHROCYTE SEDIMENTATION RATE, NON-AUTOMATED: SED RATE: 92

## 2016-11-23 NOTE — Progress Notes (Signed)
Location:   [facility] Room Number: 215 A Place of Service:  SNF (31)   CODE STATUS: Full Code  No Known Allergies  Chief Complaint  Patient presents with  . Medical Management of Chronic Issues    Constipation; dysphagia; anemia; tachycardia; chronic pain    HPI:  She is a 41 year old long term resident of this facility being seen for the management of her chronic illnesses: oral phase dysphagia; tachycardia with hypotension; anemia of chronic disease; and chronic pain. She is getting out of bed on a daily a basis. She tells me that her pain is being managed; she denies any constipation; no headache or dizziness. There are no nursing concerns at this time.   Past Medical History:  Diagnosis Date  . Buttock wound 03/03/2016  . Dysrhythmia    tachycardia  . MS (multiple sclerosis) ([place])   . Neutropenia ([place])   . Pneumonia 02/2016  . Protein calorie malnutrition ([place])   . Severe sepsis ([place]) 03/03/2016  . Spastic 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 ([place])   . UTI (urinary tract infection) 02/2016    Past Surgical History:  Procedure Laterality Date  . DIVERTING ILEOSTOMY N/A 09/03/2016   Procedure: ILEAL CONDUIT  URINARY DIVERSION OPEN;  Surgeon: Ardis Hughs, MD;  Location: [facility];  Service: Urology;  Laterality: N/A;  . NO PAST SURGERIES      Social History   Social History  . Marital status: Single    Spouse name: N/A  . Number of children: N/A  . Years of education: N/A   Occupational History  . Disabled    Social History Main Topics  . Smoking status: Former Smoker    Packs/day: 1.00    Years: 10.00    Types: Cigarettes    Quit date: 02/01/2010  . Smokeless tobacco: Never Used  . Alcohol use No  . Drug use: No  . Sexual activity: Not on file   Other Topics Concern  . Not on file   Social History Narrative   She is a resident at [facility].   Right-handed.   Family History  Problem Relation Age of Onset  .  Mental illness Sister       VITAL SIGNS BP 104/66   Pulse 76   Temp 98 F (36.7 C)   Resp 18   Ht '5\' 9"'  (1.753 m)   Wt 86 lb 6.4 oz (39.2 kg)   LMP 01/28/2016   SpO2 98%   BMI 12.76 kg/m   Patient's Medications  New Prescriptions   No medications on file  Previous Medications   ACETAMINOPHEN (TYLENOL) 500 MG TABLET    Take 1,000 mg by mouth 3 (three) times daily.    AMINO ACIDS-PROTEIN HYDROLYS (FEEDING SUPPLEMENT, PRO-STAT SUGAR FREE 64,) LIQD    Take 30 mLs by mouth 2 (two) times daily.   BACLOFEN (LIORESAL) 20 MG TABLET    Take 20 mg by mouth 4 (four) times daily.   BISACODYL (DULCOLAX) 10 MG SUPPOSITORY    Place 1 suppository (10 mg total) rectally daily as needed for mild constipation or moderate constipation.   BISACODYL (DULCOLAX) 5 MG EC TABLET    Take 5 mg by mouth daily.   CYANOCOBALAMIN (B-12 PO)    Take 1 tablet by mouth daily.   DICLOFENAC SODIUM (VOLTAREN) 1 % GEL    Apply 2 g topically 3 (three) times daily. Apply to Left shoulder   FAMOTIDINE (PEPCID) 20 MG TABLET  Take 20 mg by mouth every morning.   FERROUS SULFATE 325 (65 FE) MG TABLET    Take 1 tablet (325 mg total) by mouth 2 (two) times daily with a meal.   IBUPROFEN (ADVIL,MOTRIN) 200 MG TABLET    Take 200 mg by mouth 3 (three) times daily.   MIDODRINE (PROAMATINE) 2.5 MG TABLET    Take 1 tablet (2.5 mg total) by mouth 3 (three) times daily with meals.   MIRTAZAPINE (REMERON) 7.5 MG TABLET    Take 7.5 mg by mouth at bedtime.   MULTIPLE VITAMINS-MINERALS (DECUBI-VITE) CAPS    Take 1 capsule by mouth daily.   NUTRITIONAL SUPPLEMENTS (NUTRITIONAL SUPPLEMENT PO)    House  Supplement - Give 120cc by mouth three times daily   ONDANSETRON (ZOFRAN) 4 MG TABLET    Take 4 mg by mouth every 6 (six) hours as needed for nausea or vomiting.   POLYETHYLENE GLYCOL (MIRALAX / GLYCOLAX) PACKET    Take 17 g by mouth daily.   VITAMIN C (ASCORBIC ACID) 500 MG TABLET    Take 500 mg by mouth 2 (two) times daily.  Modified  Medications   No medications on file  Discontinued Medications   ACETAMINOPHEN (TYLENOL) 325 MG TABLET    Take 650 mg by mouth 3 (three) times daily.      SIGNIFICANT DIAGNOSTIC EXAMS  PREVIOUS   03-04-16: swallow study: recommend diet of dysphagia 3 solids, honey thick liquids (no straw), meds whole in puree, and cough/clear throat after every few bites/sips  03-07-16: chest x-ray: Increase left lung opacity concerning for worsening pneumonia or atelectasis with associated pleural effusion.   03-07-16: mri of pelvis: 1. Cellulitis and diffuse myofasciitis involving the pelvic and hip musculature. There also or rim enhancing fluid collections in the gluteus maximus muscles bilaterally concerning for pyomyositis. 2. No findings to suggest septic arthritis or osteomyelitis. 3. Shallow sacral decubitus ulcer but no underlying sacral osteomyelitis. 4. Markedly distended bladder.  03-07-16: mri of lumbar: 1. Negative for lumbar spine infection or impingement. 2. There is non organized edema in the subcutaneous fat. Negative for abscess. 3. Urinary retention causing bilateral hydroureteronephrosis, also seen by CT in 2010.   03-12-16: bone marrow biopsy: Successful CT guided left iliac bone marrow aspiration and core biopsy. Note, an additional sample was set aside for Gram stain and culture analysis.  04-05-16: transvaginal and pelvic ultrasound: 1. Nonvisualization of the left ovary   2. Otherwise negative pelvic ultrasound  04-05-16: renal ultrasound: Mild right hydronephrosis.  04-05-16: chest x-ray: No evidence of pneumonia.  04-07-16: ct angio of chest: 1. No acute pulmonary embolus. 2. Tiny tree-in-bud densities in the right upper and lower lobes consistent with bronchiolitis. 3. 5 mm or less pulmonary nodular densities are also seen within the right upper and lower lobes possibly representing subpleural and perifissural lymph nodes. No follow-up needed if patient is low-risk (and has no known or  suspected primary neoplasm). Non-contrast chest CT can be considered in 12 months if patient is high-risk.    06-03-16: TEE: - The patient was in sinus tachycardia. Normal LV size with EF 45-50%, diffuse hypokinesis. Normal RV size with mildly decreased systolic function. No significant valvular abnormalities.  07-15-16: mri of brain: This MRI of the brain with and without contrast shows the following: 1.   Multiple infratentorial and hemispheric T2/FLAIR hyperintense foci consistent with chronic demyelinating plaque associated with multiple sclerosis. None of the foci appeared to be acute. 2.   Moderate cortical atrophy and  corpus callosum atrophy with mild brainstem atrophy. 3.   There are no acute findings.  07-15-16: mri cervical spine: This MRI of the cervical spine without contrast shows the following: 1.   Multiple T2 hyperintense foci within the spinal cord as detailed above.   IV access was not obtainable for contrast. 2.   Mild disc bulges at C3-C4, C4-C5 and C5-C6 but do not lead to any nerve root compression or central canal narrowing.    08-31-16: tee: - Left ventricle: The cavity size was normal. Systolic function was normal. The estimated ejection fraction was in the range of 55% to 60%. Wall motion was normal; there were no regional wall motion abnormalities. Left ventricular diastolic function parameters were normal. - Mitral valve: Systolic bowing without prolapse  08-31-16: right upper quad ultrasound: Normal RIGHT upper quadrant ultrasound.  08-31-16: kub: Mild chronic bronchitic changes, stable. No pneumonia, CHF, nor other acute cardiopulmonary abnormality.   08-31-16: ct angio of chest: 1.  No demonstrable pulmonary embolus. 2.  Atelectasis with probable early pneumonia lateral left base. 3. Mild central peribronchial thickening. Suspect chronic bronchitis. 4.  Small pleural effusions bilaterally. 5.  No demonstrable adenopathy.   09-05-16: kub: 1. Postoperative changes. 2.  Ureteral stents. 3. Hiatal hernia. 4. Nonobstructive bowel gas pattern.  10-21-16: left shoulder x-ray: no acute osseous abnormality  NO NEW EXAMS      LABS REVIEWED:PREVIOUS   03-02-16: wbc 13.2; hgb 11.7; hct 37.8; mcv 92.0; plt 526; glucose 163; bun 12; creat 0.77; k+ 3.7; na++ 141; ast 113; alt 55; alk phos 118; total bili 1.7; albumin 2.2  03-05-16: wbc 2.9; hgb 8.8; hct 29.4; mcv 93.3; plt 360; glucose 110; bun 13; creat 0.87; k+ 3.5 ;na++ 158; mag 2.3 03-07-16: wbc 1.8; hgb 9.3; hct 31.0; mcv 92.3; plt 299; glucose 112; bun 9; creat 0.99; k+ 3.2 ;na++ 143; mag 2.0; urine culture: e-coli; blood cultures: neg; HIV nr 03-12-16: wbc 2.6; hgb 8.7; hct 29.0; mcv 89.8; plt 392; glucose 104; bun <5; creat 0.47; k+ 3.6; na++ 141 03-15-16: wbc 24.2; hgb 8.7; hct 28.9; mcv 90.6; plt 496; glucose 88; bun 6; creat 0.41; k+ 3.9; na++ 144 mag 1.8  03-19-16: wbc 12.3; hgb 10.7; hct 35.8; mcv 91.7; plt 513; glucose 112; bun 14.2; creat 0.40; k+ 4.7; na++149; alt 64; ast 73; albumin 3.2  04-05-16: wbc 10.9; hgb 10.3; hct 33.0; mcv 89.7; plt 632; glucose 92; bun 10; creat 0.58; k+ 3.7; na++ 141; mag 1.7; urine culture: citrobacter freundii:cipro  04-06-16: wbc 9.0; hgb 8.2; hct 26.7; mcv 91.1; plt 532; glucose 89; bun 9; creat 0.41; k+ 3.2; na++ 143; liver normal albumin 2.1 04-07-16: wbc 7.9 ;hgb 7.0; hct 22.8; mcv 90.5; plt 517;glucose 82; bun 6; creat 0.38; k+ 4.2; na++ 142; iron 14; tibc 174; ferritin 276; folate 10.1; vit B 12: 423; tsh 1.741; d-dimer 3.67  04-08-16: wbc 6.3; hgb 8.4; hct 27.1; mcv 84.4 ;plt 526; glucose 87; bun 6; creat 0.34; k+ 3.8; na++ 139  05-12-16: wbc 9.8; hgb 10.3; hct 32.6; mcv 90.7; plt 497; glucose 92; bun 11.2; creat 0.54; k+ 4.5; na++ 140; liver normal albumin 4.0;  06-23-16: wbc 12.1; hgb 9.4; hct 31.2; mcv 87.3; plt 629; glucose 150; bun 11.6; creat 0.22; k+ 3.9; na++ 139 alt 63; ast 44; alk phos 219; albumin 3.0; vit B 12: 562; folic acid 56.3; pre-albumin 9; tsh 2.28  07-28-16: wbc 9.1;  hgb 11.4; hct 37.7; mcv 85.9; plt 715; glucose 111; bun 11; creat 0.35;  k+ 4.4; na++ 141; ca 9.7 07-30-16: wbc 8.2; hgb 9.4; hct 31.2; mcv 84.3; plt 529; glucose 99; bun 5; creat <0.3; k+ 4.1; na++ 141; ca 9.1; alk phos 143; albumin 2.6  08-30-16: wbc 12.2; hgb 12.1; hct 39.9; mcv 85.6; plt 603; glucose 106; bun 11; creat 0.40; k+ 4.7; na++ 143; ca 9.8; blood culture X2: MRSA 08-31-16: wbc 9.7; hgb 9.0; hct 29.4; mcv 83.8; plt 4457; glucose 147; bun 15; creat 0.42; k+ 3.8; na++ 138; ca 8.2; ast 60; alt 61; alk phos 134; albumin 2.1; Ddimer: 5.37;blood culture: staph hominis/MRSA 09-01-16: wbc 6.9; hgb 8.3; hct 26.9; mcv 84.3; plt 502; glucose 158; bun <5; creat <0.3; k+ 3.6; na++ 141; ca 8.7 blood culture: no growth  09-03-16: wbc 4.9; hgb 8.0; hct 26.0; mcv 84.7; plt 606; glucose 83; bun 6; creat <0.3; k+ 4.0; na++ 141; ca 9.1; liver normal albumin 3.1; mag 1.9  09-07-16: wbc 5.6; hgb 8.6; hct 28.7; mcv 85.4; plt 592; glucose 93; bun <5; creat <0.3; k+ 3.5; na++ 142; ca 8.7; liver normal albumin 2.6; pre-albumin 9.3; mag 1.8; phos 4.3; trig 138 09-08-16: iron 28; tibc 172; ferritin 205; vit B 12: 261; tsh 4.266 09-10-16: glucose  96; bun 9; creat <0.3; k+ 3.5; na++ 142; ca 8.9; mag 1.8; phos 3.2  09-14-16: wbc 15.3; hgb 10.7; hct 34.9; mcv 88.2 plt 601; glucose 102; bun 10.6; creat 0.35; k+ 3.8; na++ 144; liver normal albumin 3.4  TODAY:   10-25-16: glucose 85 bun 9.5; creat 0.21; k+ 4.6; na++ 142; ca 10.0; liver normal albumin 3.6     Review of Systems  Constitutional: Negative for malaise/fatigue.  Respiratory: Negative for cough and shortness of breath.   Cardiovascular: Negative for chest pain, palpitations and leg swelling.  Gastrointestinal: Negative for abdominal pain, constipation and heartburn.  Musculoskeletal: Negative for back pain, joint pain and myalgias.  Skin:       Has sores   Neurological: Negative for dizziness.  Psychiatric/Behavioral: The patient is not nervous/anxious.      Physical Exam  Constitutional: She is oriented to person, place, and time. No distress.  Cachexia   Neck: Neck supple. No thyromegaly present.  Cardiovascular: Normal rate, regular rhythm, normal heart sounds and intact distal pulses.  Pulmonary/Chest: Effort normal and breath sounds normal. No respiratory distress.  Abdominal: Soft. Bowel sounds are normal. She exhibits no distension. There is no tenderness.  Genitourinary:  Genitourinary Comments: Has urostomy   Musculoskeletal:  Able to move upper extremities; lower extremities with contractures    Neurological: She is alert and oriented to person, place, and time.  Skin: Skin is warm and dry. She is not diaphoretic.  Right ischium stage IV: 5.6 x 2.6 x 0.9 cm Coccyx stage IV: 9.5 x 7.0 x 0.2 cm Left ischium stage IV: 11.3 x 4.2 x 0.6 cm Left lateral foot: stage IV: 5.0 x 3.0 x 0.3 cm  Psychiatric: She has a normal mood and affect.      ASSESSMENT/ PLAN:  TODAY:   1. Constipation: stable  will continue miralax daily and dulcolax 5 mg tab daily   2. Dysphagia: no further signs of aspiration; will continue current plan of care and will monitor  3. Anemia:  Likely related to chronic disease and iron deficiency: is status post transfusion 04/2016: hgb 10.7  will continue iron twice daily    4. Tachycardia with low blood pressure: b/p 104/66.  Will continue midodrine 2.5 mg three times daily  5. Chronic pain: is  stable: will continue tylenol 1 gm three times daily uses voltaren gel 2 gm to left shoulder three times daily and takes motrin 200 mg twice daily    PREVIOUS   6. MS: is followed by neurology. Has bilateral lower extremity splints in place. She is allergic to Tysabri;   7.  Spastic paraplegia: will continue baclofen 20 mg four times daily ; uses bilateral splints  8. Sacral decubitus stage IV: will continue treatment per facility protocol; will be followed by wound Dr.  9. Neurogenic bladder;stable ; has  bladder neck erosion: has urostomy   10. Protein calorie malnutrition: severe: her weight is variable in the 80s.  albumin is 3.4 will continue supplements per facility protocol and will continue prostat 30 cc three times daily  Is on remeron 7.5 mg nightly for appetite her current weight is 86 pounds.    MD is aware of resident's narcotic use and is in agreement with current plan of care. We will attempt to wean resident as apropriate     Ok Edwards NP [facility]  Contact [phone] Monday through Friday 8am- 5pm  After hours call [phone]

## 2016-11-24 DIAGNOSIS — R6520 Severe sepsis without septic shock: Secondary | ICD-10-CM | POA: Diagnosis not present

## 2016-11-24 DIAGNOSIS — G35 Multiple sclerosis: Secondary | ICD-10-CM | POA: Diagnosis not present

## 2016-11-24 DIAGNOSIS — M6281 Muscle weakness (generalized): Secondary | ICD-10-CM | POA: Diagnosis not present

## 2016-11-24 DIAGNOSIS — R1312 Dysphagia, oropharyngeal phase: Secondary | ICD-10-CM | POA: Diagnosis not present

## 2016-11-24 DIAGNOSIS — A419 Sepsis, unspecified organism: Secondary | ICD-10-CM | POA: Diagnosis not present

## 2016-11-25 DIAGNOSIS — A419 Sepsis, unspecified organism: Secondary | ICD-10-CM | POA: Diagnosis not present

## 2016-11-25 DIAGNOSIS — R1312 Dysphagia, oropharyngeal phase: Secondary | ICD-10-CM | POA: Diagnosis not present

## 2016-11-25 DIAGNOSIS — G35 Multiple sclerosis: Secondary | ICD-10-CM | POA: Diagnosis not present

## 2016-11-25 DIAGNOSIS — M6281 Muscle weakness (generalized): Secondary | ICD-10-CM | POA: Diagnosis not present

## 2016-11-25 DIAGNOSIS — R6520 Severe sepsis without septic shock: Secondary | ICD-10-CM | POA: Diagnosis not present

## 2016-11-26 DIAGNOSIS — G35 Multiple sclerosis: Secondary | ICD-10-CM | POA: Diagnosis not present

## 2016-11-26 DIAGNOSIS — M6281 Muscle weakness (generalized): Secondary | ICD-10-CM | POA: Diagnosis not present

## 2016-11-26 DIAGNOSIS — A419 Sepsis, unspecified organism: Secondary | ICD-10-CM | POA: Diagnosis not present

## 2016-11-26 DIAGNOSIS — R6520 Severe sepsis without septic shock: Secondary | ICD-10-CM | POA: Diagnosis not present

## 2016-11-26 DIAGNOSIS — R1312 Dysphagia, oropharyngeal phase: Secondary | ICD-10-CM | POA: Diagnosis not present

## 2016-11-29 DIAGNOSIS — M6281 Muscle weakness (generalized): Secondary | ICD-10-CM | POA: Diagnosis not present

## 2016-11-29 DIAGNOSIS — R1312 Dysphagia, oropharyngeal phase: Secondary | ICD-10-CM | POA: Diagnosis not present

## 2016-11-29 DIAGNOSIS — T8090XA Unspecified complication following infusion and therapeutic injection, initial encounter: Secondary | ICD-10-CM | POA: Diagnosis not present

## 2016-11-29 DIAGNOSIS — R6520 Severe sepsis without septic shock: Secondary | ICD-10-CM | POA: Diagnosis not present

## 2016-11-29 DIAGNOSIS — G35 Multiple sclerosis: Secondary | ICD-10-CM | POA: Diagnosis not present

## 2016-11-29 DIAGNOSIS — A419 Sepsis, unspecified organism: Secondary | ICD-10-CM | POA: Diagnosis not present

## 2016-11-29 DIAGNOSIS — Z23 Encounter for immunization: Secondary | ICD-10-CM | POA: Diagnosis not present

## 2016-11-30 DIAGNOSIS — G35 Multiple sclerosis: Secondary | ICD-10-CM | POA: Diagnosis not present

## 2016-12-07 DIAGNOSIS — G35 Multiple sclerosis: Secondary | ICD-10-CM | POA: Diagnosis not present

## 2016-12-07 DIAGNOSIS — L89154 Pressure ulcer of sacral region, stage 4: Secondary | ICD-10-CM | POA: Diagnosis not present

## 2016-12-07 DIAGNOSIS — L89144 Pressure ulcer of left lower back, stage 4: Secondary | ICD-10-CM | POA: Diagnosis not present

## 2016-12-07 DIAGNOSIS — L89134 Pressure ulcer of right lower back, stage 4: Secondary | ICD-10-CM | POA: Diagnosis not present

## 2016-12-13 DIAGNOSIS — G8929 Other chronic pain: Secondary | ICD-10-CM | POA: Insufficient documentation

## 2016-12-14 ENCOUNTER — Telehealth: Payer: Self-pay | Admitting: *Deleted

## 2016-12-14 NOTE — Telephone Encounter (Addendum)
Dr. [name] office [facility]) called today Deanna Baldwin, [place]).  States the patient received Rituxan on 10/11/16.  She received Orevus 300mg  on 11/10/16.  The nursing home instructed [facility] today that the patient plans to only continue care at our office.  She has a pending appointment on 12/17/16 to receive another 300mg  of Ocrevus that will be canceled.  The patient or her facility did not notify either neurologist that she was being treated by both offices.

## 2016-12-14 NOTE — Telephone Encounter (Signed)
Please reach patient, get labs CMP, CBC with diff, TSH, UA with reflex microscopic exam, [id number].

## 2016-12-14 NOTE — Telephone Encounter (Addendum)
Spoke to Almyra Free, Therapist, sports at Dr. [name] office.  She is aware that the patient plans to continue care here.  Also, aware that Dr. Krista Blue is ordering extensive labs, seeing her in follow up and planning a washout period of six months prior to placing her on Ocrevus.  Her pending Ocrevus appt here on 12/17/16 has been canceled with [facility].

## 2016-12-15 NOTE — Telephone Encounter (Signed)
Called [facility] [phone]) and left message for patient's nurse to return my call Gwen Pounds).

## 2016-12-15 NOTE — Telephone Encounter (Signed)
I called [facility] again and was able to speak with Tyrinda.  She is fully aware of the plan below and verbalized understanding.  Dr. Krista Blue would like to see this patient back in our office for follow up.  She plans to order her labs at her appointment.  Gwen Pounds was also give the appt information (01/19/17 at 1pm with an arrival time of 12:30) and will notify the facility's transportation services.

## 2016-12-21 DIAGNOSIS — L89144 Pressure ulcer of left lower back, stage 4: Secondary | ICD-10-CM | POA: Diagnosis not present

## 2016-12-21 DIAGNOSIS — L89154 Pressure ulcer of sacral region, stage 4: Secondary | ICD-10-CM | POA: Diagnosis not present

## 2016-12-27 DIAGNOSIS — N360 Urethral fistula: Secondary | ICD-10-CM | POA: Diagnosis not present

## 2016-12-27 DIAGNOSIS — N312 Flaccid neuropathic bladder, not elsewhere classified: Secondary | ICD-10-CM | POA: Diagnosis not present

## 2016-12-27 DIAGNOSIS — G35 Multiple sclerosis: Secondary | ICD-10-CM | POA: Diagnosis not present

## 2016-12-28 DIAGNOSIS — R6520 Severe sepsis without septic shock: Secondary | ICD-10-CM | POA: Diagnosis not present

## 2016-12-28 DIAGNOSIS — A419 Sepsis, unspecified organism: Secondary | ICD-10-CM | POA: Diagnosis not present

## 2016-12-28 DIAGNOSIS — R1312 Dysphagia, oropharyngeal phase: Secondary | ICD-10-CM | POA: Diagnosis not present

## 2016-12-28 DIAGNOSIS — M6281 Muscle weakness (generalized): Secondary | ICD-10-CM | POA: Diagnosis not present

## 2016-12-28 DIAGNOSIS — G35 Multiple sclerosis: Secondary | ICD-10-CM | POA: Diagnosis not present

## 2016-12-29 DIAGNOSIS — R6520 Severe sepsis without septic shock: Secondary | ICD-10-CM | POA: Diagnosis not present

## 2016-12-29 DIAGNOSIS — G35 Multiple sclerosis: Secondary | ICD-10-CM | POA: Diagnosis not present

## 2016-12-29 DIAGNOSIS — A419 Sepsis, unspecified organism: Secondary | ICD-10-CM | POA: Diagnosis not present

## 2016-12-29 DIAGNOSIS — R1312 Dysphagia, oropharyngeal phase: Secondary | ICD-10-CM | POA: Diagnosis not present

## 2016-12-29 DIAGNOSIS — M6281 Muscle weakness (generalized): Secondary | ICD-10-CM | POA: Diagnosis not present

## 2016-12-30 ENCOUNTER — Encounter: Payer: Self-pay | Admitting: Adult Health

## 2016-12-30 ENCOUNTER — Non-Acute Institutional Stay (SKILLED_NURSING_FACILITY): Payer: Medicare Other | Admitting: Adult Health

## 2016-12-30 DIAGNOSIS — L89154 Pressure ulcer of sacral region, stage 4: Secondary | ICD-10-CM

## 2016-12-30 DIAGNOSIS — N319 Neuromuscular dysfunction of bladder, unspecified: Secondary | ICD-10-CM

## 2016-12-30 DIAGNOSIS — G8220 Paraplegia, unspecified: Secondary | ICD-10-CM | POA: Diagnosis not present

## 2016-12-30 DIAGNOSIS — R1312 Dysphagia, oropharyngeal phase: Secondary | ICD-10-CM | POA: Diagnosis not present

## 2016-12-30 DIAGNOSIS — R6520 Severe sepsis without septic shock: Secondary | ICD-10-CM | POA: Diagnosis not present

## 2016-12-30 DIAGNOSIS — G35 Multiple sclerosis: Secondary | ICD-10-CM | POA: Diagnosis not present

## 2016-12-30 DIAGNOSIS — M6281 Muscle weakness (generalized): Secondary | ICD-10-CM | POA: Diagnosis not present

## 2016-12-30 DIAGNOSIS — A419 Sepsis, unspecified organism: Secondary | ICD-10-CM | POA: Diagnosis not present

## 2016-12-30 DIAGNOSIS — E43 Unspecified severe protein-calorie malnutrition: Secondary | ICD-10-CM

## 2016-12-30 NOTE — Progress Notes (Signed)
Location:   [facility] Room Number: 215 [id number] Place of Service:  SNF (31)   CODE STATUS: full code   No Known Allergies  Chief Complaint  Patient presents with  . Medical Management of Chronic Issues    MS paraplegia; sacral wounds; neurogenic bladder; severe protein calorie malnutrition.     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  Protein calorie malnutrition; severe. She denies problems with pain; no change in appetite; no complaints of insomnia. There are no nursing concerns at this time.   Past Medical History:  Diagnosis Date  . Buttock wound 03/03/2016  . Dysrhythmia    tachycardia  . MS (multiple sclerosis) ([place])   . Neutropenia ([place])   . Pneumonia 02/2016  . Protein calorie malnutrition ([place])   . Severe sepsis ([place]) 03/03/2016  . Spastic 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 ([place])   . UTI (urinary tract infection) 02/2016    Past Surgical History:  Procedure Laterality Date  . DIVERTING ILEOSTOMY N/A 09/03/2016   Procedure: ILEAL CONDUIT  URINARY DIVERSION OPEN;  Surgeon: Ardis Hughs, MD;  Location: [facility];  Service: Urology;  Laterality: N/A;  . NO PAST SURGERIES      Social History   Socioeconomic History  . Marital status: Single    Spouse name: Not on file  . Number of children: Not on file  . Years of education: Not on file  . Highest education level: Not on file  Social Needs  . Financial resource strain: Not on file  . Food insecurity - worry: Not on file  . Food insecurity - inability: Not on file  . Transportation needs - medical: Not on file  . Transportation needs - non-medical: Not on file  Occupational History  . Occupation: Disabled  Tobacco Use  . Smoking status: Former Smoker    Packs/day: 1.00   Years: 10.00    Pack years: 10.00    Types: Cigarettes    Last attempt to quit: 02/01/2010    Years since quitting: 6.9  . Smokeless tobacco: Never Used  Substance and Sexual Activity  . Alcohol use: No  . Drug use: No  . Sexual activity: Not on file  Other Topics Concern  . Not on file  Social History Narrative   She is a resident at [facility].   Right-handed.   Family History  Problem Relation Age of Onset  . Mental illness Sister       VITAL SIGNS Ht '5\' 9"'  (1.753 m)   Wt 94 lb 9.6 oz (42.9 kg)   LMP 01/28/2016   BMI 13.97 kg/m   Outpatient Encounter Medications as of 12/30/2016  Medication Sig  . acetaminophen (TYLENOL) 500 MG tablet Take 1,000 mg by mouth 3 (three) times daily.   . Amino Acids-Protein Hydrolys (FEEDING SUPPLEMENT, PRO-STAT SUGAR FREE 64,) LIQD Take 30 mLs by mouth 2 (two) times daily.  . baclofen (LIORESAL) 20 MG tablet Take 20 mg by mouth 4 (four) times daily.  . bisacodyl (DULCOLAX) 10 MG suppository Place 1 suppository (10 mg total) rectally daily as needed for mild constipation or moderate constipation.  . bisacodyl (DULCOLAX) 5 MG EC tablet Take 5 mg by mouth daily.  . Cyanocobalamin (B-12 PO) Take 1 tablet by mouth daily.  . diclofenac sodium (VOLTAREN)  1 % GEL Apply 2 g topically 3 (three) times daily. Apply to Left shoulder  . famotidine (PEPCID) 20 MG tablet Take 20 mg by mouth every morning.  . ferrous sulfate 325 (65 FE) MG tablet Take 1 tablet (325 mg total) by mouth 2 (two) times daily with a meal.  . ibuprofen (ADVIL,MOTRIN) 200 MG tablet Take 200 mg by mouth 3 (three) times daily.  . midodrine (PROAMATINE) 2.5 MG tablet Take 1 tablet (2.5 mg total) by mouth 3 (three) times daily with meals.  . mirtazapine (REMERON) 7.5 MG tablet Take 7.5 mg by mouth at bedtime.  . Multiple Vitamins-Minerals (DECUBI-VITE) CAPS Take 1 capsule by mouth daily.  . Nutritional Supplements (NUTRITIONAL SUPPLEMENT PO) House  Supplement - Give 120cc  by mouth three times daily  . ondansetron (ZOFRAN) 4 MG tablet Take 4 mg by mouth every 6 (six) hours as needed for nausea or vomiting.  . polyethylene glycol (MIRALAX / GLYCOLAX) packet Take 17 g by mouth daily.  . vitamin C (ASCORBIC ACID) 500 MG tablet Take 500 mg by mouth 2 (two) times daily.   No facility-administered encounter medications on file as of 12/30/2016.      SIGNIFICANT DIAGNOSTIC EXAMS  PREVIOUS   03-04-16: swallow study: recommend diet of dysphagia 3 solids, honey thick liquids (no straw), meds whole in puree, and cough/clear throat after every few bites/sips  03-07-16: chest x-ray: Increase left lung opacity concerning for worsening pneumonia or atelectasis with associated pleural effusion.   03-07-16: mri of pelvis: 1. Cellulitis and diffuse myofasciitis involving the pelvic and hip musculature. There also or rim enhancing fluid collections in the gluteus maximus muscles bilaterally concerning for pyomyositis. 2. No findings to suggest septic arthritis or osteomyelitis. 3. Shallow sacral decubitus ulcer but no underlying sacral osteomyelitis. 4. Markedly distended bladder.  03-07-16: mri of lumbar: 1. Negative for lumbar spine infection or impingement. 2. There is non organized edema in the subcutaneous fat. Negative for abscess. 3. Urinary retention causing bilateral hydroureteronephrosis, also seen by CT in 2010.   03-12-16: bone marrow biopsy: Successful CT guided left iliac bone marrow aspiration and core biopsy. Note, an additional sample was set aside for Gram stain and culture analysis.  04-05-16: transvaginal and pelvic ultrasound: 1. Nonvisualization of the left ovary   2. Otherwise negative pelvic ultrasound  04-05-16: renal ultrasound: Mild right hydronephrosis.  04-05-16: chest x-ray: No evidence of pneumonia.  04-07-16: ct angio of chest: 1. No acute pulmonary embolus. 2. Tiny tree-in-bud densities in the right upper and lower lobes consistent with bronchiolitis. 3.  5 mm or less pulmonary nodular densities are also seen within the right upper and lower lobes possibly representing subpleural and perifissural lymph nodes. No follow-up needed if patient is low-risk (and has no known or suspected primary neoplasm). Non-contrast chest CT can be considered in 12 months if patient is high-risk.    06-03-16: TEE: - The patient was in sinus tachycardia. Normal LV size with EF 45-50%, diffuse hypokinesis. Normal RV size with mildly decreased systolic function. No significant valvular abnormalities.  07-15-16: mri of brain: This MRI of the brain with and without contrast shows the following: 1.   Multiple infratentorial and hemispheric T2/FLAIR hyperintense foci consistent with chronic demyelinating plaque associated with multiple sclerosis. None of the foci appeared to be acute. 2.   Moderate cortical atrophy and corpus callosum atrophy with mild brainstem atrophy. 3.   There are no acute findings.  07-15-16: mri cervical spine: This MRI of the cervical  spine without contrast shows the following: 1.   Multiple T2 hyperintense foci within the spinal cord as detailed above.   IV access was not obtainable for contrast. 2.   Mild disc bulges at C3-C4, C4-C5 and C5-C6 but do not lead to any nerve root compression or central canal narrowing.    08-31-16: tee: - Left ventricle: The cavity size was normal. Systolic function was normal. The estimated ejection fraction was in the range of 55% to 60%. Wall motion was normal; there were no regional wall motion abnormalities. Left ventricular diastolic function parameters were normal. - Mitral valve: Systolic bowing without prolapse  08-31-16: right upper quad ultrasound: Normal RIGHT upper quadrant ultrasound.  08-31-16: kub: Mild chronic bronchitic changes, stable. No pneumonia, CHF, nor other acute cardiopulmonary abnormality.   08-31-16: ct angio of chest: 1.  No demonstrable pulmonary embolus. 2.  Atelectasis with probable early  pneumonia lateral left base. 3. Mild central peribronchial thickening. Suspect chronic bronchitis. 4.  Small pleural effusions bilaterally. 5.  No demonstrable adenopathy.   09-05-16: kub: 1. Postoperative changes. 2. Ureteral stents. 3. Hiatal hernia. 4. Nonobstructive bowel gas pattern.  10-21-16: left shoulder x-ray: no acute osseous abnormality  NO NEW EXAMS      LABS REVIEWED:PREVIOUS   03-02-16: wbc 13.2; hgb 11.7; hct 37.8; mcv 92.0; plt 526; glucose 163; bun 12; creat 0.77; k+ 3.7; na++ 141; ast 113; alt 55; alk phos 118; total bili 1.7; albumin 2.2  03-05-16: wbc 2.9; hgb 8.8; hct 29.4; mcv 93.3; plt 360; glucose 110; bun 13; creat 0.87; k+ 3.5 ;na++ 158; mag 2.3 03-07-16: wbc 1.8; hgb 9.3; hct 31.0; mcv 92.3; plt 299; glucose 112; bun 9; creat 0.99; k+ 3.2 ;na++ 143; mag 2.0; urine culture: e-coli; blood cultures: neg; HIV nr 03-12-16: wbc 2.6; hgb 8.7; hct 29.0; mcv 89.8; plt 392; glucose 104; bun <5; creat 0.47; k+ 3.6; na++ 141 03-15-16: wbc 24.2; hgb 8.7; hct 28.9; mcv 90.6; plt 496; glucose 88; bun 6; creat 0.41; k+ 3.9; na++ 144 mag 1.8  03-19-16: wbc 12.3; hgb 10.7; hct 35.8; mcv 91.7; plt 513; glucose 112; bun 14.2; creat 0.40; k+ 4.7; na++149; alt 64; ast 73; albumin 3.2  04-05-16: wbc 10.9; hgb 10.3; hct 33.0; mcv 89.7; plt 632; glucose 92; bun 10; creat 0.58; k+ 3.7; na++ 141; mag 1.7; urine culture: citrobacter freundii:cipro  04-06-16: wbc 9.0; hgb 8.2; hct 26.7; mcv 91.1; plt 532; glucose 89; bun 9; creat 0.41; k+ 3.2; na++ 143; liver normal albumin 2.1 04-07-16: wbc 7.9 ;hgb 7.0; hct 22.8; mcv 90.5; plt 517;glucose 82; bun 6; creat 0.38; k+ 4.2; na++ 142; iron 14; tibc 174; ferritin 276; folate 10.1; vit B 12: 423; tsh 1.741; d-dimer 3.67  04-08-16: wbc 6.3; hgb 8.4; hct 27.1; mcv 84.4 ;plt 526; glucose 87; bun 6; creat 0.34; k+ 3.8; na++ 139  05-12-16: wbc 9.8; hgb 10.3; hct 32.6; mcv 90.7; plt 497; glucose 92; bun 11.2; creat 0.54; k+ 4.5; na++ 140; liver normal albumin 4.0;    06-23-16: wbc 12.1; hgb 9.4; hct 31.2; mcv 87.3; plt 629; glucose 150; bun 11.6; creat 0.22; k+ 3.9; na++ 139 alt 63; ast 44; alk phos 219; albumin 3.0; vit B 12: 324; folic acid 40.1; pre-albumin 9; tsh 2.28  07-28-16: wbc 9.1; hgb 11.4; hct 37.7; mcv 85.9; plt 715; glucose 111; bun 11; creat 0.35; k+ 4.4; na++ 141; ca 9.7 07-30-16: wbc 8.2; hgb 9.4; hct 31.2; mcv 84.3; plt 529; glucose 99; bun 5; creat <0.3; k+  4.1; na++ 141; ca 9.1; alk phos 143; albumin 2.6  08-30-16: wbc 12.2; hgb 12.1; hct 39.9; mcv 85.6; plt 603; glucose 106; bun 11; creat 0.40; k+ 4.7; na++ 143; ca 9.8; blood culture X2: MRSA 08-31-16: wbc 9.7; hgb 9.0; hct 29.4; mcv 83.8; plt 4457; glucose 147; bun 15; creat 0.42; k+ 3.8; na++ 138; ca 8.2; ast 60; alt 61; alk phos 134; albumin 2.1; Ddimer: 5.37;blood culture: staph hominis/MRSA 09-01-16: wbc 6.9; hgb 8.3; hct 26.9; mcv 84.3; plt 502; glucose 158; bun <5; creat <0.3; k+ 3.6; na++ 141; ca 8.7 blood culture: no growth  09-03-16: wbc 4.9; hgb 8.0; hct 26.0; mcv 84.7; plt 606; glucose 83; bun 6; creat <0.3; k+ 4.0; na++ 141; ca 9.1; liver normal albumin 3.1; mag 1.9  09-07-16: wbc 5.6; hgb 8.6; hct 28.7; mcv 85.4; plt 592; glucose 93; bun <5; creat <0.3; k+ 3.5; na++ 142; ca 8.7; liver normal albumin 2.6; pre-albumin 9.3; mag 1.8; phos 4.3; trig 138 09-08-16: iron 28; tibc 172; ferritin 205; vit B 12: 261; tsh 4.266 09-10-16: glucose  96; bun 9; creat <0.3; k+ 3.5; na++ 142; ca 8.9; mag 1.8; phos 3.2  09-14-16: wbc 15.3; hgb 10.7; hct 34.9; mcv 88.2 plt 601; glucose 102; bun 10.6; creat 0.35; k+ 3.8; na++ 144; liver normal albumin 3.4 10-25-16: glucose 85 bun 9.5; creat 0.21; k+ 4.6; na++ 142; ca 10.0; liver normal albumin 3.6  TODAY:   11-02-16: albumin 3.7 11-17-16: CRP: 10.80 11-23-16: sed rate 92      Review of Systems  Constitutional: Negative for malaise/fatigue.  Respiratory: Negative for cough and shortness of breath.   Cardiovascular: Negative for chest pain, palpitations and  leg swelling.  Gastrointestinal: Negative for abdominal pain, constipation and heartburn.  Musculoskeletal: Negative for back pain, joint pain and myalgias.  Skin: Negative.   Neurological: Negative for dizziness.  Psychiatric/Behavioral: The patient is not nervous/anxious.    Physical Exam  Constitutional: She is oriented to person, place, and time. No distress.  Cachexia   Neck: No thyromegaly present.  Cardiovascular: Normal rate, regular rhythm and intact distal pulses.  Murmur heard. Systolic 1/6  Pulmonary/Chest: Effort normal and breath sounds normal. No respiratory distress.  Abdominal: Soft. Bowel sounds are normal. She exhibits no distension. There is no tenderness.  Genitourinary:  Genitourinary Comments: Has urostomy   Musculoskeletal: She exhibits no edema.  Able to move upper extremities; lower extremities with contractures   Lymphadenopathy:    She has no cervical adenopathy.  Neurological: She is oriented to person, place, and time.  Skin: Skin is warm and dry. She is not diaphoretic.  Right ischium stage IV: 5.6 x 2.6 x 0.9 cm Coccyx stage IV: 9.5 x 7.0 x 0.2 cm Left ischium stage IV: 11.3 x 4.2 x 0.6 cm Left lateral foot: stage IV: 5.0 x 3.0 x 0.3 cm   Psychiatric: She has a normal mood and affect.      ASSESSMENT/ PLAN:  TODAY:   1. MS: is followed by neurology. Has bilateral lower extremity splints in place. She is allergic to Tysabri;   2.  Spastic paraplegia: will continue baclofen 20 mg four times daily ; uses bilateral splints  3. Sacral decubitus stage IV: will continue treatment per facility protocol; will be followed by wound Dr.  4. Neurogenic bladder;stable ; has bladder neck erosion: has urostomy is stable   5. Protein calorie malnutrition: severe: her weight is variable in the 80s.  albumin is 3.4 will continue supplements per facility protocol  and will continue prostat 30 cc two times daily  Is on remeron 7.5 mg nightly for appetite her  current weight is 94 pounds. Will lower her remeron to every other night for 2 weeks then stop   PREVIOUS   6. Constipation: stable  will continue miralax daily and dulcolax 5 mg tab daily   7. Dysphagia: no further signs of aspiration; will continue current plan of care and will monitor  8. Anemia:  Likely related to chronic disease and iron deficiency: is status post transfusion 04/2016: hgb 10.7  will continue iron twice daily    9. Tachycardia with low blood pressure: b/p 110/78.  Will continue midodrine 2.5 mg three times daily  10. Chronic pain: is stable: will continue tylenol 1 gm three times daily uses voltaren gel 2 gm to left shoulder three times daily and takes motrin 200 mg twice daily    MD is aware of resident's narcotic use and is in agreement with current plan of care. We will attempt to wean resident as apropriate     Ok Edwards NP [facility]  Contact [phone] Monday through Friday 8am- 5pm  After hours call [phone]

## 2016-12-31 DIAGNOSIS — R6520 Severe sepsis without septic shock: Secondary | ICD-10-CM | POA: Diagnosis not present

## 2016-12-31 DIAGNOSIS — M6281 Muscle weakness (generalized): Secondary | ICD-10-CM | POA: Diagnosis not present

## 2016-12-31 DIAGNOSIS — G35 Multiple sclerosis: Secondary | ICD-10-CM | POA: Diagnosis not present

## 2016-12-31 DIAGNOSIS — R1312 Dysphagia, oropharyngeal phase: Secondary | ICD-10-CM | POA: Diagnosis not present

## 2016-12-31 DIAGNOSIS — A419 Sepsis, unspecified organism: Secondary | ICD-10-CM | POA: Diagnosis not present

## 2017-01-03 DIAGNOSIS — M6281 Muscle weakness (generalized): Secondary | ICD-10-CM | POA: Diagnosis not present

## 2017-01-03 DIAGNOSIS — M6249 Contracture of muscle, multiple sites: Secondary | ICD-10-CM | POA: Diagnosis not present

## 2017-01-03 DIAGNOSIS — R293 Abnormal posture: Secondary | ICD-10-CM | POA: Diagnosis not present

## 2017-01-03 DIAGNOSIS — R1312 Dysphagia, oropharyngeal phase: Secondary | ICD-10-CM | POA: Diagnosis not present

## 2017-01-03 DIAGNOSIS — M791 Myalgia, unspecified site: Secondary | ICD-10-CM | POA: Diagnosis not present

## 2017-01-04 DIAGNOSIS — R293 Abnormal posture: Secondary | ICD-10-CM | POA: Diagnosis not present

## 2017-01-04 DIAGNOSIS — M791 Myalgia, unspecified site: Secondary | ICD-10-CM | POA: Diagnosis not present

## 2017-01-04 DIAGNOSIS — R1312 Dysphagia, oropharyngeal phase: Secondary | ICD-10-CM | POA: Diagnosis not present

## 2017-01-04 DIAGNOSIS — M6249 Contracture of muscle, multiple sites: Secondary | ICD-10-CM | POA: Diagnosis not present

## 2017-01-04 DIAGNOSIS — L89134 Pressure ulcer of right lower back, stage 4: Secondary | ICD-10-CM | POA: Diagnosis not present

## 2017-01-04 DIAGNOSIS — M6281 Muscle weakness (generalized): Secondary | ICD-10-CM | POA: Diagnosis not present

## 2017-01-04 DIAGNOSIS — L89154 Pressure ulcer of sacral region, stage 4: Secondary | ICD-10-CM | POA: Diagnosis not present

## 2017-01-05 DIAGNOSIS — R630 Anorexia: Secondary | ICD-10-CM | POA: Diagnosis not present

## 2017-01-05 DIAGNOSIS — M791 Myalgia, unspecified site: Secondary | ICD-10-CM | POA: Diagnosis not present

## 2017-01-05 DIAGNOSIS — F4320 Adjustment disorder, unspecified: Secondary | ICD-10-CM | POA: Diagnosis not present

## 2017-01-05 DIAGNOSIS — M6249 Contracture of muscle, multiple sites: Secondary | ICD-10-CM | POA: Diagnosis not present

## 2017-01-05 DIAGNOSIS — M6281 Muscle weakness (generalized): Secondary | ICD-10-CM | POA: Diagnosis not present

## 2017-01-05 DIAGNOSIS — R293 Abnormal posture: Secondary | ICD-10-CM | POA: Diagnosis not present

## 2017-01-05 DIAGNOSIS — F329 Major depressive disorder, single episode, unspecified: Secondary | ICD-10-CM | POA: Diagnosis not present

## 2017-01-05 DIAGNOSIS — R1312 Dysphagia, oropharyngeal phase: Secondary | ICD-10-CM | POA: Diagnosis not present

## 2017-01-06 DIAGNOSIS — M6249 Contracture of muscle, multiple sites: Secondary | ICD-10-CM | POA: Diagnosis not present

## 2017-01-06 DIAGNOSIS — M791 Myalgia, unspecified site: Secondary | ICD-10-CM | POA: Diagnosis not present

## 2017-01-06 DIAGNOSIS — R1312 Dysphagia, oropharyngeal phase: Secondary | ICD-10-CM | POA: Diagnosis not present

## 2017-01-06 DIAGNOSIS — R293 Abnormal posture: Secondary | ICD-10-CM | POA: Diagnosis not present

## 2017-01-06 DIAGNOSIS — M6281 Muscle weakness (generalized): Secondary | ICD-10-CM | POA: Diagnosis not present

## 2017-01-07 DIAGNOSIS — R1312 Dysphagia, oropharyngeal phase: Secondary | ICD-10-CM | POA: Diagnosis not present

## 2017-01-07 DIAGNOSIS — M791 Myalgia, unspecified site: Secondary | ICD-10-CM | POA: Diagnosis not present

## 2017-01-07 DIAGNOSIS — R293 Abnormal posture: Secondary | ICD-10-CM | POA: Diagnosis not present

## 2017-01-07 DIAGNOSIS — M6281 Muscle weakness (generalized): Secondary | ICD-10-CM | POA: Diagnosis not present

## 2017-01-07 DIAGNOSIS — M6249 Contracture of muscle, multiple sites: Secondary | ICD-10-CM | POA: Diagnosis not present

## 2017-01-10 DIAGNOSIS — R1312 Dysphagia, oropharyngeal phase: Secondary | ICD-10-CM | POA: Diagnosis not present

## 2017-01-10 DIAGNOSIS — M6249 Contracture of muscle, multiple sites: Secondary | ICD-10-CM | POA: Diagnosis not present

## 2017-01-10 DIAGNOSIS — R293 Abnormal posture: Secondary | ICD-10-CM | POA: Diagnosis not present

## 2017-01-10 DIAGNOSIS — M791 Myalgia, unspecified site: Secondary | ICD-10-CM | POA: Diagnosis not present

## 2017-01-10 DIAGNOSIS — M6281 Muscle weakness (generalized): Secondary | ICD-10-CM | POA: Diagnosis not present

## 2017-01-11 DIAGNOSIS — R293 Abnormal posture: Secondary | ICD-10-CM | POA: Diagnosis not present

## 2017-01-11 DIAGNOSIS — L89154 Pressure ulcer of sacral region, stage 4: Secondary | ICD-10-CM | POA: Diagnosis not present

## 2017-01-11 DIAGNOSIS — M6281 Muscle weakness (generalized): Secondary | ICD-10-CM | POA: Diagnosis not present

## 2017-01-11 DIAGNOSIS — M791 Myalgia, unspecified site: Secondary | ICD-10-CM | POA: Diagnosis not present

## 2017-01-11 DIAGNOSIS — L89134 Pressure ulcer of right lower back, stage 4: Secondary | ICD-10-CM | POA: Diagnosis not present

## 2017-01-11 DIAGNOSIS — M6249 Contracture of muscle, multiple sites: Secondary | ICD-10-CM | POA: Diagnosis not present

## 2017-01-11 DIAGNOSIS — R1312 Dysphagia, oropharyngeal phase: Secondary | ICD-10-CM | POA: Diagnosis not present

## 2017-01-12 DIAGNOSIS — M791 Myalgia, unspecified site: Secondary | ICD-10-CM | POA: Diagnosis not present

## 2017-01-12 DIAGNOSIS — R293 Abnormal posture: Secondary | ICD-10-CM | POA: Diagnosis not present

## 2017-01-12 DIAGNOSIS — R1312 Dysphagia, oropharyngeal phase: Secondary | ICD-10-CM | POA: Diagnosis not present

## 2017-01-12 DIAGNOSIS — M6249 Contracture of muscle, multiple sites: Secondary | ICD-10-CM | POA: Diagnosis not present

## 2017-01-12 DIAGNOSIS — M6281 Muscle weakness (generalized): Secondary | ICD-10-CM | POA: Diagnosis not present

## 2017-01-13 DIAGNOSIS — M6281 Muscle weakness (generalized): Secondary | ICD-10-CM | POA: Diagnosis not present

## 2017-01-13 DIAGNOSIS — M791 Myalgia, unspecified site: Secondary | ICD-10-CM | POA: Diagnosis not present

## 2017-01-13 DIAGNOSIS — M6249 Contracture of muscle, multiple sites: Secondary | ICD-10-CM | POA: Diagnosis not present

## 2017-01-13 DIAGNOSIS — R293 Abnormal posture: Secondary | ICD-10-CM | POA: Diagnosis not present

## 2017-01-13 DIAGNOSIS — R1312 Dysphagia, oropharyngeal phase: Secondary | ICD-10-CM | POA: Diagnosis not present

## 2017-01-14 DIAGNOSIS — M6249 Contracture of muscle, multiple sites: Secondary | ICD-10-CM | POA: Diagnosis not present

## 2017-01-14 DIAGNOSIS — R1312 Dysphagia, oropharyngeal phase: Secondary | ICD-10-CM | POA: Diagnosis not present

## 2017-01-14 DIAGNOSIS — M6281 Muscle weakness (generalized): Secondary | ICD-10-CM | POA: Diagnosis not present

## 2017-01-14 DIAGNOSIS — M791 Myalgia, unspecified site: Secondary | ICD-10-CM | POA: Diagnosis not present

## 2017-01-14 DIAGNOSIS — R293 Abnormal posture: Secondary | ICD-10-CM | POA: Diagnosis not present

## 2017-01-17 DIAGNOSIS — M6249 Contracture of muscle, multiple sites: Secondary | ICD-10-CM | POA: Diagnosis not present

## 2017-01-17 DIAGNOSIS — R1312 Dysphagia, oropharyngeal phase: Secondary | ICD-10-CM | POA: Diagnosis not present

## 2017-01-17 DIAGNOSIS — R293 Abnormal posture: Secondary | ICD-10-CM | POA: Diagnosis not present

## 2017-01-17 DIAGNOSIS — M791 Myalgia, unspecified site: Secondary | ICD-10-CM | POA: Diagnosis not present

## 2017-01-17 DIAGNOSIS — M6281 Muscle weakness (generalized): Secondary | ICD-10-CM | POA: Diagnosis not present

## 2017-01-18 DIAGNOSIS — L89144 Pressure ulcer of left lower back, stage 4: Secondary | ICD-10-CM | POA: Diagnosis not present

## 2017-01-18 DIAGNOSIS — M6249 Contracture of muscle, multiple sites: Secondary | ICD-10-CM | POA: Diagnosis not present

## 2017-01-18 DIAGNOSIS — R293 Abnormal posture: Secondary | ICD-10-CM | POA: Diagnosis not present

## 2017-01-18 DIAGNOSIS — L89154 Pressure ulcer of sacral region, stage 4: Secondary | ICD-10-CM | POA: Diagnosis not present

## 2017-01-18 DIAGNOSIS — R1312 Dysphagia, oropharyngeal phase: Secondary | ICD-10-CM | POA: Diagnosis not present

## 2017-01-18 DIAGNOSIS — M791 Myalgia, unspecified site: Secondary | ICD-10-CM | POA: Diagnosis not present

## 2017-01-18 DIAGNOSIS — M6281 Muscle weakness (generalized): Secondary | ICD-10-CM | POA: Diagnosis not present

## 2017-01-19 ENCOUNTER — Encounter: Payer: Self-pay | Admitting: Neurology

## 2017-01-19 ENCOUNTER — Ambulatory Visit (INDEPENDENT_AMBULATORY_CARE_PROVIDER_SITE_OTHER): Payer: Medicare Other | Admitting: Neurology

## 2017-01-19 VITALS — BP 88/71 | HR 112

## 2017-01-19 DIAGNOSIS — M791 Myalgia, unspecified site: Secondary | ICD-10-CM | POA: Diagnosis not present

## 2017-01-19 DIAGNOSIS — G35 Multiple sclerosis: Secondary | ICD-10-CM | POA: Diagnosis not present

## 2017-01-19 DIAGNOSIS — R52 Pain, unspecified: Secondary | ICD-10-CM | POA: Diagnosis not present

## 2017-01-19 DIAGNOSIS — G8220 Paraplegia, unspecified: Secondary | ICD-10-CM | POA: Diagnosis not present

## 2017-01-19 DIAGNOSIS — R799 Abnormal finding of blood chemistry, unspecified: Secondary | ICD-10-CM | POA: Diagnosis not present

## 2017-01-19 DIAGNOSIS — R1312 Dysphagia, oropharyngeal phase: Secondary | ICD-10-CM | POA: Diagnosis not present

## 2017-01-19 DIAGNOSIS — M6249 Contracture of muscle, multiple sites: Secondary | ICD-10-CM | POA: Diagnosis not present

## 2017-01-19 DIAGNOSIS — R293 Abnormal posture: Secondary | ICD-10-CM | POA: Diagnosis not present

## 2017-01-19 DIAGNOSIS — Z79899 Other long term (current) drug therapy: Secondary | ICD-10-CM | POA: Diagnosis not present

## 2017-01-19 DIAGNOSIS — M6281 Muscle weakness (generalized): Secondary | ICD-10-CM | POA: Diagnosis not present

## 2017-01-19 NOTE — Progress Notes (Signed)
PATIENT: Deanna Baldwin DOB: Jul 01, 1975  Chief Complaint  Patient presents with  . Multiple Sclerosis    She resides at [facility].  She has no new concerns today.  She would like to discuss her medication management.  . Spasticity    She is interested in starting Xeomin injections.     HISTORICAL  Deanna Baldwin is a 41 years old right-handed female, she is currently a resident at [facility], she is accompanied by her physical therapist [name]  and staff, seen in refer by her primary care physician Dr.  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  She lived at home with her 4 years old son and her aunt was her primary caregiver before she moved to current nursing home in 2017, her aunt visit her occasionally, facility is in charge of her medical care.  She was diagnosed with relapsing remitting multiple sclerosis since age 17, she initially presented with blurry vision, moderate fatigue, difficulty walking, she had frequent flareups over the years, eventually become wheelchair-bound since 2011,  She was under the care of neurologist at [facility] Dr. [name], I was able to review Dr. [name] note from 2013 to most recent  in February 2017, she had allergic reaction to Tysarbri IV infusion, was treated with rituximab since early 2013, 1000 mg every 6 months, she tolerated very well, infusion was in December 2016, because of her social status change, she was not able to get it every 6 months. For a while she was taking Ampyra for gait abnormality, but has stopped it because it is not helping her  Based on last neurology examination in February 2017, she was not ambulatory, in a power wheelchair, significant bilateral lower extremity spasticity, only has trace movement of bilateral leg, upper extremity motor strengths were well-preserved.  MRI of the brain and spine in May  2015, chronic demyelinating lesions, no enhancing lesion.  She has multiple hospital admission recently, for frequent UTI, decubitus ulcer, with progressive increased weakness, also increased spasticity of bilateral lower extremity, decreased mobility to moving the chair, even to hold her sit up position,  I was able to review MRI of the cervical spine with and without contrast in May 2015, multifocal patchy area of T2/FLAIR signal abnormality in the entire cervical cord, visualized brainstem and upper thoracic spine, no contrast enhancement.  MRI of the brain 2015, stable November and distribution of numerous hyper intense flair lesions throughout the subcortical, deep and periventricular white matter, involvement of entire corpus callosum, as well as midbrain and pons, right cerebellum.  MRI of the brain 2012, questionable enhancement,  UPDATE Jan 19 2017:  MRI of the brain with and without contrast in June 2018: 1.   Multiple infratentorial and hemispheric T2/FLAIR hyperintense foci consistent with chronic demyelinating plaque associated with multiple sclerosis. None of the foci appeared to be acute. 2.   Moderate cortical atrophy and corpus callosum atrophy with mild brainstem atrophy. 3.   There are no acute findings.   IMPRESSION:  This MRI of the cervical spine without contrast shows the following: 1.   Multiple T2 hyperintense foci within the spinal cord as detailed above.   IV access was not obtainable for contrast. 2.   Mild disc bulges at C3-C4, C4-C5 and C5-C6 but do not lead to any nerve root compression or central canal narrowing.  Patient received Rituxan 1000mg  on 10/11/16 from [facility]  Dr. [name] office,.  She received Orevus 300mg  on 11/10/16 from intra-fusion.  Patient stated [facility] home, wants to keep appointment with [place]  Laboratory evaluation on November 29 2016 from [facility], RDW was elevated at 17.4, CD19 less than 1%, 20, less than 1%, normal  liver functional test  She is wheelchair-bound, no memory loss, significant visual difficulty, spastic paraplegia  REVIEW OF SYSTEMS: Full 14 system review of systems performed and notable only for as above   ALLERGIES: No Known Allergies  HOME MEDICATIONS: Current Outpatient Medications  Medication Sig Dispense Refill  . acetaminophen (TYLENOL) 500 MG tablet Take 1,000 mg by mouth 3 (three) times daily.     . Amino Acids-Protein Hydrolys (FEEDING SUPPLEMENT, PRO-STAT SUGAR FREE 64,) LIQD Take 30 mLs by mouth 2 (two) times daily. 900 mL 0  . baclofen (LIORESAL) 20 MG tablet Take 20 mg by mouth 4 (four) times daily.    . bisacodyl (DULCOLAX) 10 MG suppository Place 1 suppository (10 mg total) rectally daily as needed for mild constipation or moderate constipation. 12 suppository 0  . bisacodyl (DULCOLAX) 5 MG EC tablet Take 5 mg by mouth daily.    . Cyanocobalamin (B-12 PO) Take 1 tablet by mouth daily.    . diclofenac sodium (VOLTAREN) 1 % GEL Apply 2 g topically 3 (three) times daily. Apply to Left shoulder    . famotidine (PEPCID) 20 MG tablet Take 20 mg by mouth every morning.    . ferrous sulfate 325 (65 FE) MG tablet Take 1 tablet (325 mg total) by mouth 2 (two) times daily with a meal. 60 tablet 0  . ibuprofen (ADVIL,MOTRIN) 200 MG tablet Take 200 mg by mouth 3 (three) times daily.    . midodrine (PROAMATINE) 2.5 MG tablet Take 1 tablet (2.5 mg total) by mouth 3 (three) times daily with meals.    . Multiple Vitamins-Minerals (DECUBI-VITE) CAPS Take 1 capsule by mouth daily.    . Nutritional Supplements (NUTRITIONAL SUPPLEMENT PO) House  Supplement - Give 120cc by mouth three times daily    . ondansetron (ZOFRAN) 4 MG tablet Take 4 mg by mouth every 6 (six) hours as needed for nausea or vomiting.    . polyethylene glycol (MIRALAX / GLYCOLAX) packet Take 17 g by mouth daily.    . vitamin C (ASCORBIC ACID) 500 MG tablet Take 500 mg by mouth 2 (two) times daily.     No current  facility-administered medications for this visit.     PAST MEDICAL HISTORY: Past Medical History:  Diagnosis Date  . Buttock wound 03/03/2016  . Dysrhythmia    tachycardia  . MS (multiple sclerosis) ([place])   . Neutropenia ([place])   . Pneumonia 02/2016  . Protein calorie malnutrition ([place])   . Severe sepsis ([place]) 03/03/2016  . 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 UTI (urinary tract infection) 02/2016    PAST SURGICAL HISTORY: Past Surgical History:  Procedure Laterality Date  . DIVERTING ILEOSTOMY N/A 09/03/2016   Procedure: ILEAL CONDUIT  URINARY DIVERSION OPEN;  Surgeon: Ardis Hughs, MD;  Location: [facility];  Service: Urology;  Laterality: N/A;  . NO PAST SURGERIES      FAMILY HISTORY: Family History  Problem Relation Age of Onset  . Mental illness Sister     SOCIAL HISTORY:  Social History   Socioeconomic History  . Marital status: Single    Spouse name: Not on file  . Number of children: Not on file  .  Years of education: Not on file  . Highest education level: Not on file  Social Needs  . Financial resource strain: Not on file  . Food insecurity - worry: Not on file  . Food insecurity - inability: Not on file  . Transportation needs - medical: Not on file  . Transportation needs - non-medical: Not on file  Occupational History  . Occupation: Disabled  Tobacco Use  . Smoking status: Former Smoker    Packs/day: 1.00    Years: 10.00    Pack years: 10.00    Types: Cigarettes    Last attempt to quit: 02/01/2010    Years since quitting: 6.9  . Smokeless tobacco: Never Used  Substance and Sexual Activity  . Alcohol use: No  . Drug use: No  . Sexual activity: Not on file  Other Topics Concern  . Not on file  Social History Narrative   She is a resident at [facility].   Right-handed.     PHYSICAL EXAM   Vitals:   01/19/17 1307  BP: (!) 88/71  Pulse: (!) 112    Not recorded      There is no height or weight  on file to calculate BMI.  PHYSICAL EXAMNIATION:  Gen: NAD, conversant, well nourised, obese, well groomed                     Cardiovascular: Regular rate rhythm, no peripheral edema, warm, nontender. Eyes: Conjunctivae clear without exudates or hemorrhage Neck: Supple, no carotid bruits. Pulmonary: Clear to auscultation bilaterally   NEUROLOGICAL EXAM:  MENTAL STATUS: Speech/Cognition:  Cachexia, She sees in wheelchair, cooperative on examination, visual acuity both eyes 20/200, rotatory horizontal nystagmus bilaterally   CRANIAL NERVES: CN II: Visual fields are full to confrontation. Pupils are round equal and briskly reactive to light. CN III, IV, VI: extraocular movement are normal. No ptosis. CN V: Facial sensation is intact to pinprick in all 3 divisions bilaterally. Corneal responses are intact.  CN VII: Face is symmetric with normal eye closure and smile. CN VIII: Hearing is normal to rubbing fingers CN IX, X: Palate elevates symmetrically. Phonation is normal. CN XI: Head turning and shoulder shrug are intact CN XII: Tongue is midline with normal movements and no atrophy.  MOTOR:  Free movement of bilateral upper extremity, significant spasticity of bilateral lower extremity, no antigravity movement, fixed contraction of bilateral knee, tendency for hip adduction  REFLEXES: Reflexes are 2+ and symmetric at the biceps, triceps, knees, and ankles. Plantar responses are flexor.  SENSORY: Intact to light touch, pinprick,  COORDINATION: No dysmetria of finger-to-nose    GAIT/STANCE: Deferred   DIAGNOSTIC DATA (LABS, IMAGING, TESTING) - I reviewed patient records, labs, notes, testing and imaging myself where available.   ASSESSMENT AND PLAN  Joliana Claflin is a 41 y.o. 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 Ph.D.  [facility] [address] [place], [place] [address] Ph: [phone] Fax: [phone]  [phone]  CC: [name], DO

## 2017-01-20 DIAGNOSIS — R1312 Dysphagia, oropharyngeal phase: Secondary | ICD-10-CM | POA: Diagnosis not present

## 2017-01-20 DIAGNOSIS — R293 Abnormal posture: Secondary | ICD-10-CM | POA: Diagnosis not present

## 2017-01-20 DIAGNOSIS — M6281 Muscle weakness (generalized): Secondary | ICD-10-CM | POA: Diagnosis not present

## 2017-01-20 DIAGNOSIS — M6249 Contracture of muscle, multiple sites: Secondary | ICD-10-CM | POA: Diagnosis not present

## 2017-01-20 DIAGNOSIS — M791 Myalgia, unspecified site: Secondary | ICD-10-CM | POA: Diagnosis not present

## 2017-01-21 DIAGNOSIS — M6281 Muscle weakness (generalized): Secondary | ICD-10-CM | POA: Diagnosis not present

## 2017-01-21 DIAGNOSIS — M6249 Contracture of muscle, multiple sites: Secondary | ICD-10-CM | POA: Diagnosis not present

## 2017-01-21 DIAGNOSIS — R1312 Dysphagia, oropharyngeal phase: Secondary | ICD-10-CM | POA: Diagnosis not present

## 2017-01-21 DIAGNOSIS — M791 Myalgia, unspecified site: Secondary | ICD-10-CM | POA: Diagnosis not present

## 2017-01-21 DIAGNOSIS — R293 Abnormal posture: Secondary | ICD-10-CM | POA: Diagnosis not present

## 2017-01-22 LAB — COMPREHENSIVE METABOLIC PANEL
A/G RATIO: 1.2 (ref 1.2–2.2)
ALT: 167 IU/L — AB (ref 0–32)
AST: 135 IU/L — ABNORMAL HIGH (ref 0–40)
Albumin: 4.1 g/dL (ref 3.5–5.5)
Alkaline Phosphatase: 444 IU/L — ABNORMAL HIGH (ref 39–117)
BUN/Creatinine Ratio: 53 — ABNORMAL HIGH (ref 9–23)
BUN: 19 mg/dL (ref 6–24)
CHLORIDE: 100 mmol/L (ref 96–106)
CO2: 19 mmol/L — ABNORMAL LOW (ref 20–29)
Calcium: 9.9 mg/dL (ref 8.7–10.2)
Creatinine, Ser: 0.36 mg/dL — ABNORMAL LOW (ref 0.57–1.00)
GFR calc non Af Amer: 135 mL/min/{1.73_m2} (ref >59)
GFR, EST AFRICAN AMERICAN: 156 mL/min/{1.73_m2} (ref >59)
GLOBULIN, TOTAL: 3.3 g/dL (ref 1.5–4.5)
Glucose: 110 mg/dL — ABNORMAL HIGH (ref 65–99)
POTASSIUM: 4.9 mmol/L (ref 3.5–5.2)
SODIUM: 139 mmol/L (ref 134–144)
TOTAL PROTEIN: 7.4 g/dL (ref 6.0–8.5)

## 2017-01-22 LAB — CBC WITH DIFFERENTIAL/PLATELET
BASOS: 0 %
Basophils Absolute: 0 10*3/uL (ref 0.0–0.2)
EOS (ABSOLUTE): 0.3 10*3/uL (ref 0.0–0.4)
Eos: 4 %
HEMATOCRIT: 31.5 % — AB (ref 34.0–46.6)
Hemoglobin: 10.1 g/dL — ABNORMAL LOW (ref 11.1–15.9)
IMMATURE GRANS (ABS): 0 10*3/uL (ref 0.0–0.1)
Immature Granulocytes: 0 %
Lymphocytes Absolute: 1.2 10*3/uL (ref 0.7–3.1)
Lymphs: 14 %
MCH: 26 pg — ABNORMAL LOW (ref 26.6–33.0)
MCHC: 32.1 g/dL (ref 31.5–35.7)
MCV: 81 fL (ref 79–97)
MONOS ABS: 0.4 10*3/uL (ref 0.1–0.9)
Monocytes: 5 %
NEUTROS ABS: 6.4 10*3/uL (ref 1.4–7.0)
Neutrophils: 77 %
PLATELETS: 462 10*3/uL — AB (ref 150–379)
RBC: 3.89 x10E6/uL (ref 3.77–5.28)
RDW: 18 % — ABNORMAL HIGH (ref 12.3–15.4)
WBC: 8.4 10*3/uL (ref 3.4–10.8)

## 2017-01-22 LAB — TSH: TSH: 4.54 u[IU]/mL — ABNORMAL HIGH (ref 0.450–4.500)

## 2017-01-22 LAB — CD19 AND CD20, FLOW CYTOMETRY
% CD19: NEGATIVE %
% CD20: NEGATIVE %

## 2017-01-23 DIAGNOSIS — M6249 Contracture of muscle, multiple sites: Secondary | ICD-10-CM | POA: Diagnosis not present

## 2017-01-23 DIAGNOSIS — M6281 Muscle weakness (generalized): Secondary | ICD-10-CM | POA: Diagnosis not present

## 2017-01-23 DIAGNOSIS — M791 Myalgia, unspecified site: Secondary | ICD-10-CM | POA: Diagnosis not present

## 2017-01-23 DIAGNOSIS — R1312 Dysphagia, oropharyngeal phase: Secondary | ICD-10-CM | POA: Diagnosis not present

## 2017-01-23 DIAGNOSIS — R293 Abnormal posture: Secondary | ICD-10-CM | POA: Diagnosis not present

## 2017-01-24 DIAGNOSIS — M6281 Muscle weakness (generalized): Secondary | ICD-10-CM | POA: Diagnosis not present

## 2017-01-24 DIAGNOSIS — R1312 Dysphagia, oropharyngeal phase: Secondary | ICD-10-CM | POA: Diagnosis not present

## 2017-01-24 DIAGNOSIS — M6249 Contracture of muscle, multiple sites: Secondary | ICD-10-CM | POA: Diagnosis not present

## 2017-01-24 DIAGNOSIS — R293 Abnormal posture: Secondary | ICD-10-CM | POA: Diagnosis not present

## 2017-01-24 DIAGNOSIS — M791 Myalgia, unspecified site: Secondary | ICD-10-CM | POA: Diagnosis not present

## 2017-01-25 DIAGNOSIS — R293 Abnormal posture: Secondary | ICD-10-CM | POA: Diagnosis not present

## 2017-01-25 DIAGNOSIS — M791 Myalgia, unspecified site: Secondary | ICD-10-CM | POA: Diagnosis not present

## 2017-01-25 DIAGNOSIS — M6249 Contracture of muscle, multiple sites: Secondary | ICD-10-CM | POA: Diagnosis not present

## 2017-01-25 DIAGNOSIS — M6281 Muscle weakness (generalized): Secondary | ICD-10-CM | POA: Diagnosis not present

## 2017-01-25 DIAGNOSIS — R1312 Dysphagia, oropharyngeal phase: Secondary | ICD-10-CM | POA: Diagnosis not present

## 2017-01-26 ENCOUNTER — Telehealth: Payer: Self-pay | Admitting: Neurology

## 2017-01-26 DIAGNOSIS — M6281 Muscle weakness (generalized): Secondary | ICD-10-CM | POA: Diagnosis not present

## 2017-01-26 DIAGNOSIS — R1312 Dysphagia, oropharyngeal phase: Secondary | ICD-10-CM | POA: Diagnosis not present

## 2017-01-26 DIAGNOSIS — M791 Myalgia, unspecified site: Secondary | ICD-10-CM | POA: Diagnosis not present

## 2017-01-26 DIAGNOSIS — M6249 Contracture of muscle, multiple sites: Secondary | ICD-10-CM | POA: Diagnosis not present

## 2017-01-26 DIAGNOSIS — R293 Abnormal posture: Secondary | ICD-10-CM | POA: Diagnosis not present

## 2017-01-26 NOTE — Telephone Encounter (Signed)
I called the extended care facility.  The liver enzymes are significantly elevated compared to 3 months ago, we will have the blood work repeated next week, the patient will stop the Tylenol, we will add a hepatitis panel and a CK enzyme level to the blood work.  I have called the extended care facility and given orders.  I will fax the blood work reports to the extended care facility, and I have sent them to the primary care physician.  The fax number for the extended care facility is [phone].

## 2017-01-27 ENCOUNTER — Non-Acute Institutional Stay (SKILLED_NURSING_FACILITY): Payer: Medicare Other | Admitting: Adult Health

## 2017-01-27 ENCOUNTER — Encounter: Payer: Self-pay | Admitting: Adult Health

## 2017-01-27 DIAGNOSIS — R748 Abnormal levels of other serum enzymes: Secondary | ICD-10-CM

## 2017-01-27 DIAGNOSIS — K5909 Other constipation: Secondary | ICD-10-CM

## 2017-01-27 DIAGNOSIS — R1311 Dysphagia, oral phase: Secondary | ICD-10-CM

## 2017-01-27 DIAGNOSIS — D638 Anemia in other chronic diseases classified elsewhere: Secondary | ICD-10-CM | POA: Diagnosis not present

## 2017-01-27 NOTE — Progress Notes (Signed)
Location:   [facility] Room Number: 215 A Place of Service:  SNF (31)   CODE STATUS: Full Code  No Known Allergies  Chief Complaint  Patient presents with  . Medical Management of Chronic Issues    Constipation; dysphagia; anemia; elevated liver enzymes    HPI:  She is a 41 year old long term resident of this facility being seen for the management of her chronic illnesses: constipation; dysphagia; anemia; elevated liver enzymes. She was found to have elevated enzymes with negative CD labs. She and I have discussed her lab work; and she verbalizes understanding that she will need further blood testing and an ultrasound done. She denies any increased weakness; no constipation; no change in appetite. There are no nursing concerns at this time.   Past Medical History:  Diagnosis Date  . Buttock wound 03/03/2016  . Dysrhythmia    tachycardia  . MS (multiple sclerosis) ([place])   . Neutropenia ([place])   . Pneumonia 02/2016  . Protein calorie malnutrition ([place])   . Severe sepsis ([place]) 03/03/2016  . Spastic 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 ([place])   . UTI (urinary tract infection) 02/2016    Past Surgical History:  Procedure Laterality Date  . DIVERTING ILEOSTOMY N/A 09/03/2016   Procedure: ILEAL CONDUIT  URINARY DIVERSION OPEN;  Surgeon: Ardis Hughs, MD;  Location: [facility]S;  Service: Urology;  Laterality: N/A;  . NO PAST SURGERIES      Social History   Socioeconomic History  . Marital status: Single    Spouse name: Not on file  . Number of children: Not on file  . Years of education: Not on file  . Highest education level: Not on file  Social Needs  . Financial resource strain: Not on file  . Food insecurity - worry: Not on file  . Food insecurity - inability: Not on file  . Transportation needs - medical: Not on file  . Transportation needs - non-medical: Not on file  Occupational History  . Occupation: Disabled  Tobacco Use  . Smoking status:  Former Smoker    Packs/day: 1.00    Years: 10.00    Pack years: 10.00    Types: Cigarettes    Last attempt to quit: 02/01/2010    Years since quitting: 6.9  . Smokeless tobacco: Never Used  Substance and Sexual Activity  . Alcohol use: No  . Drug use: No  . Sexual activity: Not on file  Other Topics Concern  . Not on file  Social History Narrative   She is a resident at [facility].   Right-handed.   Family History  Problem Relation Age of Onset  . Mental illness Sister       VITAL SIGNS BP 126/80   Ht _0  (1.753 m)   Wt 97 lb 9.6 oz (44.3 kg)   LMP 01/28/2016   BMI 14.41 kg/m   Outpatient Encounter Medications as of 01/27/2017  Medication Sig  . acetaminophen (TYLENOL) 500 MG tablet Take 1,000 mg by mouth 3 (three) times daily.   . Amino Acids-Protein Hydrolys (FEEDING SUPPLEMENT, PRO-STAT SUGAR FREE 64,) LIQD Take 30 mLs by mouth 2 (two) times daily.  . baclofen (LIORESAL) 20 MG tablet Take 20 mg by mouth 4 (four) times daily.  . bisacodyl (DULCOLAX) 10 MG suppository Place 1 suppository (10 mg total) rectally daily as needed for mild constipation or moderate constipation.  . bisacodyl (DULCOLAX) 5 MG EC tablet Take 5 mg by mouth daily.  Marland Kitchen  Cyanocobalamin (B-12 PO) Take 1 tablet by mouth daily.  . diclofenac sodium (VOLTAREN) 1 % GEL Apply 2 g topically 3 (three) times daily. Apply to Left shoulder  . famotidine (PEPCID) 20 MG tablet Take 20 mg by mouth every morning.  . ferrous sulfate 325 (65 FE) MG tablet Take 1 tablet (325 mg total) by mouth 2 (two) times daily with a meal.  . ibuprofen (ADVIL,MOTRIN) 200 MG tablet Take 200 mg by mouth 3 (three) times daily.  . midodrine (PROAMATINE) 2.5 MG tablet Take 1 tablet (2.5 mg total) by mouth 3 (three) times daily with meals.  . Multiple Vitamins-Minerals (DECUBI-VITE) CAPS Take 1 capsule by mouth daily.  . Nutritional Supplements (NUTRITIONAL SUPPLEMENT PO) House  Supplement - Give 120cc by mouth three times  daily  . ondansetron (ZOFRAN) 4 MG tablet Take 4 mg by mouth every 6 (six) hours as needed for nausea or vomiting.  . polyethylene glycol (MIRALAX / GLYCOLAX) packet Take 17 g by mouth daily.  . vitamin C (ASCORBIC ACID) 500 MG tablet Take 500 mg by mouth 2 (two) times daily.   No facility-administered encounter medications on file as of 01/27/2017.      SIGNIFICANT DIAGNOSTIC EXAMS  PREVIOUS   03-04-16: swallow study: recommend diet of dysphagia 3 solids, honey thick liquids (no straw), meds whole in puree, and cough/clear throat after every few bites/sips   03-07-16: mri of pelvis: 1. Cellulitis and diffuse myofasciitis involving the pelvic and hip musculature. There also or rim enhancing fluid collections in the gluteus maximus muscles bilaterally concerning for pyomyositis. 2. No findings to suggest septic arthritis or osteomyelitis. 3. Shallow sacral decubitus ulcer but no underlying sacral osteomyelitis. 4. Markedly distended bladder.  03-07-16: mri of lumbar: 1. Negative for lumbar spine infection or impingement. 2. There is non organized edema in the subcutaneous fat. Negative for abscess. 3. Urinary retention causing bilateral hydroureteronephrosis, also seen by CT in 2010.   03-12-16: bone marrow biopsy: Successful CT guided left iliac bone marrow aspiration and core biopsy. Note, an additional sample was set aside for Gram stain and culture analysis.  04-05-16: transvaginal and pelvic ultrasound: 1. Nonvisualization of the left ovary   2. Otherwise negative pelvic ultrasound  04-05-16: renal ultrasound: Mild right hydronephrosis.  04-05-16: chest x-ray: No evidence of pneumonia.  04-07-16: ct angio of chest: 1. No acute pulmonary embolus. 2. Tiny tree-in-bud densities in the right upper and lower lobes consistent with bronchiolitis. 3. 5 mm or less pulmonary nodular densities are also seen within the right upper and lower lobes possibly representing subpleural and perifissural lymph  nodes. No follow-up needed if patient is low-risk (and has no known or suspected primary neoplasm). Non-contrast chest CT can be considered in 12 months if patient is high-risk.    06-03-16: TEE: - The patient was in sinus tachycardia. Normal LV size with EF 45-50%, diffuse hypokinesis. Normal RV size with mildly decreased systolic function. No significant valvular abnormalities.  07-15-16: mri of brain: This MRI of the brain with and without contrast shows the following: 1.   Multiple infratentorial and hemispheric T2/FLAIR hyperintense foci consistent with chronic demyelinating plaque associated with multiple sclerosis. None of the foci appeared to be acute. 2.   Moderate cortical atrophy and corpus callosum atrophy with mild brainstem atrophy. 3.   There are no acute findings.  07-15-16: mri cervical spine: This MRI of the cervical spine without contrast shows the following: 1.   Multiple T2 hyperintense foci within the spinal cord as  detailed above.   IV access was not obtainable for contrast. 2.   Mild disc bulges at C3-C4, C4-C5 and C5-C6 but do not lead to any nerve root compression or central canal narrowing.    08-31-16: tee: - Left ventricle: The cavity size was normal. Systolic function was normal. The estimated ejection fraction was in the range of 55% to 60%. Wall motion was normal; there were no regional wall motion abnormalities. Left ventricular diastolic function parameters were normal. - Mitral valve: Systolic bowing without prolapse  08-31-16: right upper quad ultrasound: Normal RIGHT upper quadrant ultrasound.  08-31-16: kub: Mild chronic bronchitic changes, stable. No pneumonia, CHF, nor other acute cardiopulmonary abnormality.   08-31-16: ct angio of chest: 1.  No demonstrable pulmonary embolus. 2.  Atelectasis with probable early pneumonia lateral left base. 3. Mild central peribronchial thickening. Suspect chronic bronchitis. 4.  Small pleural effusions bilaterally. 5.  No  demonstrable adenopathy.   09-05-16: kub: 1. Postoperative changes. 2. Ureteral stents. 3. Hiatal hernia. 4. Nonobstructive bowel gas pattern.  10-21-16: left shoulder x-ray: no acute osseous abnormality  NO NEW EXAMS      LABS REVIEWED:PREVIOUS   03-02-16: wbc 13.2; hgb 11.7; hct 37.8; mcv 92.0; plt 526; glucose 163; bun 12; creat 0.77; k+ 3.7; na++ 141; ast 113; alt 55; alk phos 118; total bili 1.7; albumin 2.2  03-05-16: wbc 2.9; hgb 8.8; hct 29.4; mcv 93.3; plt 360; glucose 110; bun 13; creat 0.87; k+ 3.5 ;na++ 158; mag 2.3 03-07-16: wbc 1.8; hgb 9.3; hct 31.0; mcv 92.3; plt 299; glucose 112; bun 9; creat 0.99; k+ 3.2 ;na++ 143; mag 2.0; urine culture: e-coli; blood cultures: neg; HIV nr 03-12-16: wbc 2.6; hgb 8.7; hct 29.0; mcv 89.8; plt 392; glucose 104; bun <5; creat 0.47; k+ 3.6; na++ 141 03-15-16: wbc 24.2; hgb 8.7; hct 28.9; mcv 90.6; plt 496; glucose 88; bun 6; creat 0.41; k+ 3.9; na++ 144 mag 1.8  03-19-16: wbc 12.3; hgb 10.7; hct 35.8; mcv 91.7; plt 513; glucose 112; bun 14.2; creat 0.40; k+ 4.7; na++149; alt 64; ast 73; albumin 3.2  04-05-16: wbc 10.9; hgb 10.3; hct 33.0; mcv 89.7; plt 632; glucose 92; bun 10; creat 0.58; k+ 3.7; na++ 141; mag 1.7; urine culture: citrobacter freundii:cipro  04-06-16: wbc 9.0; hgb 8.2; hct 26.7; mcv 91.1; plt 532; glucose 89; bun 9; creat 0.41; k+ 3.2; na++ 143; liver normal albumin 2.1 04-07-16: wbc 7.9 ;hgb 7.0; hct 22.8; mcv 90.5; plt 517;glucose 82; bun 6; creat 0.38; k+ 4.2; na++ 142; iron 14; tibc 174; ferritin 276; folate 10.1; vit B 12: 423; tsh 1.741; d-dimer 3.67  04-08-16: wbc 6.3; hgb 8.4; hct 27.1; mcv 84.4 ;plt 526; glucose 87; bun 6; creat 0.34; k+ 3.8; na++ 139  05-12-16: wbc 9.8; hgb 10.3; hct 32.6; mcv 90.7; plt 497; glucose 92; bun 11.2; creat 0.54; k+ 4.5; na++ 140; liver normal albumin 4.0;  06-23-16: wbc 12.1; hgb 9.4; hct 31.2; mcv 87.3; plt 629; glucose 150; bun 11.6; creat 0.22; k+ 3.9; na++ 139 alt 63; ast 44; alk phos 219; albumin 3.0; vit  B 12: 093; folic acid 23.5; pre-albumin 9; tsh 2.28  07-28-16: wbc 9.1; hgb 11.4; hct 37.7; mcv 85.9; plt 715; glucose 111; bun 11; creat 0.35; k+ 4.4; na++ 141; ca 9.7 07-30-16: wbc 8.2; hgb 9.4; hct 31.2; mcv 84.3; plt 529; glucose 99; bun 5; creat <0.3; k+ 4.1; na++ 141; ca 9.1; alk phos 143; albumin 2.6  08-30-16: wbc 12.2; hgb 12.1; hct 39.9; mcv  85.6; plt 603; glucose 106; bun 11; creat 0.40; k+ 4.7; na++ 143; ca 9.8; blood culture X2: MRSA 08-31-16: wbc 9.7; hgb 9.0; hct 29.4; mcv 83.8; plt 4457; glucose 147; bun 15; creat 0.42; k+ 3.8; na++ 138; ca 8.2; ast 60; alt 61; alk phos 134; albumin 2.1; Ddimer: 5.37;blood culture: staph hominis/MRSA 09-01-16: wbc 6.9; hgb 8.3; hct 26.9; mcv 84.3; plt 502; glucose 158; bun <5; creat <0.3; k+ 3.6; na++ 141; ca 8.7 blood culture: no growth  09-03-16: wbc 4.9; hgb 8.0; hct 26.0; mcv 84.7; plt 606; glucose 83; bun 6; creat <0.3; k+ 4.0; na++ 141; ca 9.1; liver normal albumin 3.1; mag 1.9  09-07-16: wbc 5.6; hgb 8.6; hct 28.7; mcv 85.4; plt 592; glucose 93; bun <5; creat <0.3; k+ 3.5; na++ 142; ca 8.7; liver normal albumin 2.6; pre-albumin 9.3; mag 1.8; phos 4.3; trig 138 09-08-16: iron 28; tibc 172; ferritin 205; vit B 12: 261; tsh 4.266 09-10-16: glucose  96; bun 9; creat <0.3; k+ 3.5; na++ 142; ca 8.9; mag 1.8; phos 3.2  09-14-16: wbc 15.3; hgb 10.7; hct 34.9; mcv 88.2 plt 601; glucose 102; bun 10.6; creat 0.35; k+ 3.8; na++ 144; liver normal albumin 3.4 10-25-16: glucose 85 bun 9.5; creat 0.21; k+ 4.6; na++ 142; ca 10.0; liver normal albumin 3.6 11-02-16: albumin 3.7 11-17-16: CRP: 10.80 11-23-16: sed rate 92    TODAY:   01-19-17: wbc 8.4; hgb 10.1; hct 31.5; mcv 81; plt 462; glucose 110; bun 19; creat 0.36; k+ 4.9; na++ 139; ca 9.9; ast 135; alt 167 alk phos 444; albumin 4.1;  CD 19 neg; CD 20; neg    Review of Systems  Constitutional: Negative for malaise/fatigue.  Respiratory: Negative for cough and shortness of breath.   Cardiovascular: Negative for chest  pain, palpitations and leg swelling.  Gastrointestinal: Negative for abdominal pain, constipation and heartburn.  Musculoskeletal: Negative for back pain, joint pain and myalgias.  Skin:       Has chronic sores   Neurological: Negative for dizziness.  Psychiatric/Behavioral: The patient is not nervous/anxious.     Physical Exam  Constitutional: She is oriented to person, place, and time. No distress.  Cachexia   Neck: No thyromegaly present.  Cardiovascular: Normal rate, regular rhythm and intact distal pulses.  Murmur heard. 1/6  Pulmonary/Chest: Effort normal and breath sounds normal. No respiratory distress.  Abdominal: Soft. Bowel sounds are normal. She exhibits no distension. There is no tenderness.  Genitourinary:  Genitourinary Comments: Has urostomy   Lymphadenopathy:    She has no cervical adenopathy.  Neurological: She is alert and oriented to person, place, and time.  Skin: Skin is warm and dry. She is not diaphoretic.  Stage IV coccyx: 4.0 x 3.4 x 0.1 cm Stage IV left ischium: 10.8 x 2.9 x 0.4 cm Stage IV right ischium: 4.6 x 3.1 x 0.8 cm undermining 1 cm at 6 o'clock   Psychiatric: She has a normal mood and affect.     ASSESSMENT/ PLAN:  TODAY:   1. Chronic constipation: stable  will continue miralax daily and dulcolax 5 mg tab daily   2. Dysphagia, oral phase: no further signs of aspiration; will continue current plan of care and will monitor  3. Anemia of chronic disease:  Likely related  iron deficiency: is status post transfusion 04/2016: hgb 10.1  will continue iron twice daily  4. Elevated liver enzymes: ast; alt; alk phos all elevated; CD 19 and 20 are neg. Will get GGT and liver ultrasound.  PREVIOUS   5. Tachycardia with low blood pressure: b/p 126/80.  Will continue midodrine 2.5 mg three times daily  6. Chronic pain: is stable: will continue tylenol 1 gm three times daily uses voltaren gel 2 gm to left shoulder three times daily and takes  motrin 200 mg twice daily   7. MS: is followed by neurology. Has bilateral lower extremity splints in place. She is allergic to Tysabri;   8.  Spastic paraplegia: will continue baclofen 20 mg four times daily ; uses bilateral splints  9. Sacral decubitus stage IV: will continue treatment per facility protocol; will be followed by wound Dr.  Lajoyce Corners. Neurogenic bladder;stable ; has bladder neck erosion: has urostomy is stable   11. Protein calorie malnutrition: severe: her weight is variable in the 80s.  albumin is 4.1 will continue supplements per facility protocol and will continue prostat 30 cc two times daily  Is on remeron 7.5 mg nightly for appetite her current weight is 97 pounds.   MD is aware of resident's narcotic use and is in agreement with current plan of care. We will attempt to wean resident as apropriate    Ok Edwards NP [facility]  Contact [phone] Monday through Friday 8am- 5pm  After hours call [phone]

## 2017-01-28 DIAGNOSIS — E119 Type 2 diabetes mellitus without complications: Secondary | ICD-10-CM | POA: Diagnosis not present

## 2017-01-28 DIAGNOSIS — R945 Abnormal results of liver function studies: Secondary | ICD-10-CM | POA: Diagnosis not present

## 2017-01-29 DIAGNOSIS — K769 Liver disease, unspecified: Secondary | ICD-10-CM | POA: Diagnosis not present

## 2017-01-29 DIAGNOSIS — M6281 Muscle weakness (generalized): Secondary | ICD-10-CM | POA: Diagnosis not present

## 2017-01-29 DIAGNOSIS — R293 Abnormal posture: Secondary | ICD-10-CM | POA: Diagnosis not present

## 2017-01-29 DIAGNOSIS — M6249 Contracture of muscle, multiple sites: Secondary | ICD-10-CM | POA: Diagnosis not present

## 2017-01-29 DIAGNOSIS — R1312 Dysphagia, oropharyngeal phase: Secondary | ICD-10-CM | POA: Diagnosis not present

## 2017-01-29 DIAGNOSIS — M791 Myalgia, unspecified site: Secondary | ICD-10-CM | POA: Diagnosis not present

## 2017-01-31 DIAGNOSIS — R293 Abnormal posture: Secondary | ICD-10-CM | POA: Diagnosis not present

## 2017-01-31 DIAGNOSIS — M791 Myalgia, unspecified site: Secondary | ICD-10-CM | POA: Diagnosis not present

## 2017-01-31 DIAGNOSIS — M6281 Muscle weakness (generalized): Secondary | ICD-10-CM | POA: Diagnosis not present

## 2017-01-31 DIAGNOSIS — M6249 Contracture of muscle, multiple sites: Secondary | ICD-10-CM | POA: Diagnosis not present

## 2017-01-31 DIAGNOSIS — R1312 Dysphagia, oropharyngeal phase: Secondary | ICD-10-CM | POA: Diagnosis not present

## 2017-02-13 DIAGNOSIS — K5909 Other constipation: Secondary | ICD-10-CM | POA: Insufficient documentation

## 2017-02-13 DIAGNOSIS — R748 Abnormal levels of other serum enzymes: Secondary | ICD-10-CM | POA: Insufficient documentation

## 2017-02-24 DIAGNOSIS — A419 Sepsis, unspecified organism: Secondary | ICD-10-CM | POA: Diagnosis not present

## 2017-02-24 DIAGNOSIS — E876 Hypokalemia: Secondary | ICD-10-CM | POA: Diagnosis not present

## 2017-02-24 DIAGNOSIS — I10 Essential (primary) hypertension: Secondary | ICD-10-CM | POA: Diagnosis not present

## 2017-02-24 DIAGNOSIS — D519 Vitamin B12 deficiency anemia, unspecified: Secondary | ICD-10-CM | POA: Diagnosis not present

## 2017-02-24 DIAGNOSIS — E785 Hyperlipidemia, unspecified: Secondary | ICD-10-CM | POA: Diagnosis not present

## 2017-02-24 DIAGNOSIS — D649 Anemia, unspecified: Secondary | ICD-10-CM | POA: Diagnosis not present

## 2017-02-24 DIAGNOSIS — D709 Neutropenia, unspecified: Secondary | ICD-10-CM | POA: Diagnosis not present

## 2017-02-24 DIAGNOSIS — E039 Hypothyroidism, unspecified: Secondary | ICD-10-CM | POA: Diagnosis not present

## 2017-02-24 LAB — BASIC METABOLIC PANEL
BUN: 34 — AB (ref 4–21)
BUN: 34 — AB (ref 4–21)
CREATININE: 0.4 — AB (ref 0.5–1.1)
Creatinine: 0.4 — AB (ref 0.5–1.1)
Glucose: 95
Glucose: 95
POTASSIUM: 4.9 (ref 3.4–5.3)
Potassium: 4.9 (ref 3.4–5.3)
SODIUM: 143 (ref 137–147)
Sodium: 143 (ref 137–147)

## 2017-02-24 LAB — VITAMIN D 25 HYDROXY (VIT D DEFICIENCY, FRACTURES): Vit D, 25-Hydroxy: 19.61

## 2017-02-24 LAB — CBC AND DIFFERENTIAL
HCT: 32 — AB (ref 36–46)
HEMOGLOBIN: 10.1 — AB (ref 12.0–16.0)
NEUTROS ABS: 5
Platelets: 433 — AB (ref 150–399)
WBC: 7.2

## 2017-02-24 LAB — HEPATIC FUNCTION PANEL
ALK PHOS: 0 — AB (ref 25–125)
ALT: 277 — AB (ref 7–35)
AST: 169 — AB (ref 13–35)
BILIRUBIN, TOTAL: 0.2

## 2017-02-24 LAB — VITAMIN B12: Vitamin B-12: 972

## 2017-02-24 LAB — TSH: TSH: 16.65 — AB (ref 0.41–5.90)

## 2017-02-24 LAB — LIPID PANEL
CHOLESTEROL: 183 (ref 0–200)
HDL: 55 (ref 35–70)
LDL Cholesterol: 107
TRIGLYCERIDES: 105 (ref 40–160)

## 2017-02-24 LAB — IRON,TIBC AND FERRITIN PANEL
Ferritin: 1071
Iron: 58
TIBC: 270
UIBC: 212

## 2017-02-28 DIAGNOSIS — A419 Sepsis, unspecified organism: Secondary | ICD-10-CM | POA: Diagnosis not present

## 2017-02-28 LAB — HM HEPATITIS C SCREENING LAB: HM Hepatitis Screen: NEGATIVE

## 2017-03-01 DIAGNOSIS — R748 Abnormal levels of other serum enzymes: Secondary | ICD-10-CM | POA: Diagnosis not present

## 2017-03-09 ENCOUNTER — Ambulatory Visit (INDEPENDENT_AMBULATORY_CARE_PROVIDER_SITE_OTHER): Payer: Medicare Other | Admitting: Neurology

## 2017-03-09 ENCOUNTER — Encounter: Payer: Self-pay | Admitting: Neurology

## 2017-03-09 ENCOUNTER — Encounter: Payer: Self-pay | Admitting: *Deleted

## 2017-03-09 ENCOUNTER — Telehealth: Payer: Self-pay | Admitting: Neurology

## 2017-03-09 VITALS — BP 96/69 | HR 117

## 2017-03-09 DIAGNOSIS — G8220 Paraplegia, unspecified: Secondary | ICD-10-CM | POA: Diagnosis not present

## 2017-03-09 DIAGNOSIS — G35 Multiple sclerosis: Secondary | ICD-10-CM

## 2017-03-09 NOTE — Progress Notes (Signed)
PATIENT: Deanna Baldwin DOB: 02-Nov-1975  Chief Complaint  Patient presents with  . Paraplegia    Xeomin 100 units x 5 vials - office supply     HISTORICAL  Deanna Baldwin is a 42 years old right-handed female, she is currently a resident at [facility], she is accompanied by her physical therapist [name]  and staff, seen in refer by her primary care physician Dr.  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  She lived at home with her 21 years old son and her aunt was her primary caregiver before she moved to current nursing home in 2017, her aunt visit her occasionally, facility is in charge of her medical care.  She was diagnosed with relapsing remitting multiple sclerosis since age 89, she initially presented with blurry vision, moderate fatigue, difficulty walking, she had frequent flareups over the years, eventually become wheelchair-bound since 2011,  She was under the care of neurologist at [facility] Dr. [name], I was able to review Dr. [name] note from 2013 to most recent  in February 2017, she had allergic reaction to Tysarbri IV infusion, was treated with rituximab since early 2013, 1000 mg every 6 months, she tolerated very well, infusion was in December 2016, because of her social status change, she was not able to get it every 6 months. For a while she was taking Ampyra for gait abnormality, but has stopped it because it is not helping her  Based on last neurology examination in February 2017, she was not ambulatory, in a power wheelchair, significant bilateral lower extremity spasticity, only has trace movement of bilateral leg, upper extremity motor strengths were well-preserved.  MRI of the brain and spine in May 2015, chronic demyelinating lesions, no enhancing lesion.  She has multiple hospital admission recently, for frequent UTI, decubitus ulcer,  with progressive increased weakness, also increased spasticity of bilateral lower extremity, decreased mobility to moving the chair, even to hold her sit up position,  I was able to review MRI of the cervical spine with and without contrast in May 2015, multifocal patchy area of T2/FLAIR signal abnormality in the entire cervical cord, visualized brainstem and upper thoracic spine, no contrast enhancement.  MRI of the brain 2015, stable November and distribution of numerous hyper intense flair lesions throughout the subcortical, deep and periventricular white matter, involvement of entire corpus callosum, as well as midbrain and pons, right cerebellum.  MRI of the brain 2012, questionable enhancement,  UPDATE Jan 19 2017:  MRI of the brain with and without contrast in June 2018: 1.   Multiple infratentorial and hemispheric T2/FLAIR hyperintense foci consistent with chronic demyelinating plaque associated with multiple sclerosis. None of the foci appeared to be acute. 2.   Moderate cortical atrophy and corpus callosum atrophy with mild brainstem atrophy. 3.   There are no acute findings.   IMPRESSION:  This MRI of the cervical spine without contrast shows the following: 1.   Multiple T2 hyperintense foci within the spinal cord as detailed above.   IV access was not obtainable for contrast. 2.   Mild disc bulges at C3-C4, C4-C5 and C5-C6 but do not lead to any nerve root compression or central canal narrowing.  Patient received Rituxan 1000mg  on 10/11/16 from [facility] Dr. [name] office,.  She received Orevus 300mg  on 11/10/16 from intra-fusion.  Patient stated [place] home, wants to keep appointment with [place]  Laboratory evaluation on November 29 2016 from [facility], RDW was elevated at 17.4, CD19 less than 1%, 20, less than 1%, normal liver functional test  She is wheelchair-bound, no memory loss, significant visual difficulty, spastic paraplegia  UPDATE Mar 09 2017: She  came in for her first electrical stimulation guided xeomin injection for spastic paraplegia, we used 500 xeomin  REVIEW OF SYSTEMS: Full 14 system review of systems performed and notable only for as above   ALLERGIES: No Known Allergies  HOME MEDICATIONS: Current Outpatient Medications  Medication Sig Dispense Refill  . acetaminophen (TYLENOL) 500 MG tablet Take 1,000 mg by mouth 3 (three) times daily.     . Amino Acids-Protein Hydrolys (FEEDING SUPPLEMENT, PRO-STAT SUGAR FREE 64,) LIQD Take 30 mLs by mouth 2 (two) times daily. 900 mL 0  . baclofen (LIORESAL) 20 MG tablet Take 20 mg by mouth 4 (four) times daily.    . bisacodyl (DULCOLAX) 10 MG suppository Place 1 suppository (10 mg total) rectally daily as needed for mild constipation or moderate constipation. 12 suppository 0  . bisacodyl (DULCOLAX) 5 MG EC tablet Take 5 mg by mouth daily.    . Cyanocobalamin (B-12 PO) Take 1 tablet by mouth daily.    . diclofenac sodium (VOLTAREN) 1 % GEL Apply 2 g topically 3 (three) times daily. Apply to Left shoulder    . famotidine (PEPCID) 20 MG tablet Take 20 mg by mouth every morning.    Marland Kitchen ibuprofen (ADVIL,MOTRIN) 200 MG tablet Take 200 mg by mouth 3 (three) times daily.    . midodrine (PROAMATINE) 2.5 MG tablet Take 1 tablet (2.5 mg total) by mouth 3 (three) times daily with meals.    . Multiple Vitamins-Minerals (DECUBI-VITE) CAPS Take 1 capsule by mouth daily.    . Nutritional Supplements (NUTRITIONAL SUPPLEMENT PO) House  Supplement - Give 120cc by mouth three times daily    . ondansetron (ZOFRAN) 4 MG tablet Take 4 mg by mouth every 6 (six) hours as needed for nausea or vomiting.    Marland Kitchen oseltamivir (TAMIFLU) 75 MG capsule Take 75 mg by mouth.    . polyethylene glycol (MIRALAX / GLYCOLAX) packet Take 17 g by mouth daily.    . vitamin C (ASCORBIC ACID) 500 MG tablet Take 500 mg by mouth 2 (two) times daily.     No current facility-administered medications for this visit.     PAST MEDICAL  HISTORY: Past Medical History:  Diagnosis Date  . Buttock wound 03/03/2016  . Dysrhythmia    tachycardia  . MS (multiple sclerosis) ([place])   . Neutropenia ([place])   . Pneumonia 02/2016  . Protein calorie malnutrition ([place])   . Severe sepsis ([place]) 03/03/2016  . 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 UTI (urinary tract infection) 02/2016    PAST SURGICAL HISTORY: Past Surgical History:  Procedure Laterality Date  . DIVERTING ILEOSTOMY N/A 09/03/2016   Procedure: ILEAL CONDUIT  URINARY DIVERSION OPEN;  Surgeon: Ardis Hughs, MD;  Location: [facility];  Service: Urology;  Laterality: N/A;  . NO PAST SURGERIES      FAMILY HISTORY: Family History  Problem Relation Age of Onset  . Mental illness Sister     SOCIAL HISTORY:  Social History   Socioeconomic History  . Marital status: Single    Spouse name: Not on file  . Number of children: Not on file  . Years of education: Not on file  . Highest education level: Not on file  Social Needs  . Financial resource strain: Not on file  . Food insecurity - worry: Not on file  . Food insecurity - inability: Not on file  . Transportation needs - medical: Not on file  . Transportation needs - non-medical: Not on file  Occupational History  . Occupation: Disabled  Tobacco Use  . Smoking status: Former Smoker    Packs/day: 1.00    Years: 10.00    Pack years: 10.00    Types: Cigarettes    Last attempt to quit: 02/01/2010    Years since quitting: 7.1  . Smokeless tobacco: Never Used  Substance and Sexual Activity  . Alcohol use: No  . Drug use: No  . Sexual activity: Not on file  Other Topics Concern  . Not on file  Social History Narrative   She is a resident at [facility].   Right-handed.     PHYSICAL EXAM   Vitals:   03/09/17 1420  BP: 96/69  Pulse: (!) 117    Not recorded      There is no height or weight on file to calculate BMI.  PHYSICAL EXAMNIATION:  Gen: NAD,  conversant, well nourised, obese, well groomed                     Cardiovascular: Regular rate rhythm, no peripheral edema, warm, nontender. Eyes: Conjunctivae clear without exudates or hemorrhage Neck: Supple, no carotid bruits. Pulmonary: Clear to auscultation bilaterally   NEUROLOGICAL EXAM:  MENTAL STATUS: Speech/Cognition:  Cachexia, She sees in wheelchair, cooperative on examination, visual acuity both eyes 20/200, rotatory horizontal nystagmus bilaterally   CRANIAL NERVES: CN II: Visual fields are full to confrontation. Pupils are round equal and briskly reactive to light. CN III, IV, VI: extraocular movement are normal. No ptosis. CN V: Facial sensation is intact to pinprick in all 3 divisions bilaterally. Corneal responses are intact.  CN VII: Face is symmetric with normal eye closure and smile. CN VIII: Hearing is normal to rubbing fingers CN IX, X: Palate elevates symmetrically. Phonation is normal. CN XI: Head turning and shoulder shrug are intact CN XII: Tongue is midline with normal movements and no atrophy.  MOTOR:  Free movement of bilateral upper extremity, significant spasticity of bilateral lower extremity, no antigravity movement, fixed contraction of bilateral knee, tendency for hip adduction  REFLEXES: Reflexes are 2+ and symmetric at the biceps, triceps, knees, and ankles. Plantar responses are flexor.  SENSORY: Intact to light touch, pinprick,  COORDINATION: No dysmetria of finger-to-nose    GAIT/STANCE: Deferred   DIAGNOSTIC DATA (LABS, IMAGING, TESTING) - I reviewed patient records, labs, notes, testing and imaging myself where available.   ASSESSMENT AND PLAN  Deanna Baldwin is a 42 y.o. female   Relapsing remitting multiple sclerosis  Allergic reaction to Yehuda Budd virus was positive with titer of 3.48 on Jun 24 2016  Her disease was able to stabilize by previous rituximab infusion, 1000 units every 6 months, last infusion was at the  [facility] in September 2018   Ocrevus infusion in October 2018  Laboratory evaluations  Resum Ocrevus infusion 6 months from her last infusion in October 2018  Spastic paraplegia  Electrical stimulation guided xeomin injection, Initial injection was on March 09, 2017    Right adductor longus 100 units  Right adductor magnus 100 units  Right iliopsoas 100 units   Left adductor longus 100 units  Left iliopsoas 50 units  Left adductor magnus 50 units   Deanna Baldwin  Krista Blue, M.D. Ph.D.  [facility] [address], [place], [place] [address] Ph: [phone] Fax: [phone]  CC: [name], DO

## 2017-03-09 NOTE — Progress Notes (Signed)
**  Xeomin 100 units x 5 vials, NDC 6222-9798-92, Lot [id number], Exp 03/2019, specialty pharmacy.//mck,rn**

## 2017-03-09 NOTE — Telephone Encounter (Signed)
Pt. Needs 12 wk Botox inj  °

## 2017-03-17 ENCOUNTER — Other Ambulatory Visit: Payer: Self-pay

## 2017-03-17 ENCOUNTER — Emergency Department (HOSPITAL_COMMUNITY): Payer: Medicare Other

## 2017-03-17 ENCOUNTER — Inpatient Hospital Stay (HOSPITAL_COMMUNITY)
Admission: EM | Admit: 2017-03-17 | Discharge: 2017-03-22 | DRG: 871 | Disposition: A | Discharge status: Skilled Nursing Facility | Payer: Medicare Other | Attending: Internal Medicine | Admitting: Internal Medicine

## 2017-03-17 ENCOUNTER — Inpatient Hospital Stay (HOSPITAL_COMMUNITY): Payer: Medicare Other

## 2017-03-17 ENCOUNTER — Encounter (HOSPITAL_COMMUNITY): Payer: Self-pay

## 2017-03-17 DIAGNOSIS — I9589 Other hypotension: Secondary | ICD-10-CM | POA: Diagnosis present

## 2017-03-17 DIAGNOSIS — K644 Residual hemorrhoidal skin tags: Secondary | ICD-10-CM | POA: Diagnosis present

## 2017-03-17 DIAGNOSIS — D649 Anemia, unspecified: Secondary | ICD-10-CM | POA: Diagnosis present

## 2017-03-17 DIAGNOSIS — E43 Unspecified severe protein-calorie malnutrition: Secondary | ICD-10-CM | POA: Diagnosis not present

## 2017-03-17 DIAGNOSIS — E860 Dehydration: Secondary | ICD-10-CM

## 2017-03-17 DIAGNOSIS — E878 Other disorders of electrolyte and fluid balance, not elsewhere classified: Secondary | ICD-10-CM | POA: Diagnosis present

## 2017-03-17 DIAGNOSIS — G114 Hereditary spastic paraplegia: Secondary | ICD-10-CM | POA: Diagnosis not present

## 2017-03-17 DIAGNOSIS — A419 Sepsis, unspecified organism: Principal | ICD-10-CM

## 2017-03-17 DIAGNOSIS — N319 Neuromuscular dysfunction of bladder, unspecified: Secondary | ICD-10-CM | POA: Diagnosis present

## 2017-03-17 DIAGNOSIS — M6281 Muscle weakness (generalized): Secondary | ICD-10-CM | POA: Diagnosis present

## 2017-03-17 DIAGNOSIS — M4628 Osteomyelitis of vertebra, sacral and sacrococcygeal region: Secondary | ICD-10-CM | POA: Diagnosis present

## 2017-03-17 DIAGNOSIS — G9341 Metabolic encephalopathy: Secondary | ICD-10-CM | POA: Diagnosis present

## 2017-03-17 DIAGNOSIS — M2450 Contracture, unspecified joint: Secondary | ICD-10-CM | POA: Diagnosis present

## 2017-03-17 DIAGNOSIS — M8619 Other acute osteomyelitis, multiple sites: Secondary | ICD-10-CM | POA: Diagnosis not present

## 2017-03-17 DIAGNOSIS — G35 Multiple sclerosis: Secondary | ICD-10-CM | POA: Diagnosis present

## 2017-03-17 DIAGNOSIS — R4182 Altered mental status, unspecified: Secondary | ICD-10-CM | POA: Diagnosis not present

## 2017-03-17 DIAGNOSIS — R748 Abnormal levels of other serum enzymes: Secondary | ICD-10-CM | POA: Diagnosis present

## 2017-03-17 DIAGNOSIS — R636 Underweight: Secondary | ICD-10-CM | POA: Diagnosis not present

## 2017-03-17 DIAGNOSIS — L89313 Pressure ulcer of right buttock, stage 3: Secondary | ICD-10-CM | POA: Diagnosis present

## 2017-03-17 DIAGNOSIS — R195 Other fecal abnormalities: Secondary | ICD-10-CM | POA: Diagnosis present

## 2017-03-17 DIAGNOSIS — M86152 Other acute osteomyelitis, left femur: Secondary | ICD-10-CM | POA: Diagnosis not present

## 2017-03-17 DIAGNOSIS — R1311 Dysphagia, oral phase: Secondary | ICD-10-CM | POA: Diagnosis present

## 2017-03-17 DIAGNOSIS — E870 Hyperosmolality and hypernatremia: Secondary | ICD-10-CM | POA: Diagnosis present

## 2017-03-17 DIAGNOSIS — R1312 Dysphagia, oropharyngeal phase: Secondary | ICD-10-CM | POA: Diagnosis present

## 2017-03-17 DIAGNOSIS — M609 Myositis, unspecified: Secondary | ICD-10-CM | POA: Diagnosis present

## 2017-03-17 DIAGNOSIS — Z515 Encounter for palliative care: Secondary | ICD-10-CM | POA: Diagnosis present

## 2017-03-17 DIAGNOSIS — L89229 Pressure ulcer of left hip, unspecified stage: Secondary | ICD-10-CM | POA: Diagnosis not present

## 2017-03-17 DIAGNOSIS — Z87440 Personal history of urinary (tract) infections: Secondary | ICD-10-CM | POA: Diagnosis not present

## 2017-03-17 DIAGNOSIS — G8929 Other chronic pain: Secondary | ICD-10-CM | POA: Diagnosis present

## 2017-03-17 DIAGNOSIS — L89320 Pressure ulcer of left buttock, unstageable: Secondary | ICD-10-CM | POA: Diagnosis present

## 2017-03-17 DIAGNOSIS — R6520 Severe sepsis without septic shock: Secondary | ICD-10-CM | POA: Diagnosis present

## 2017-03-17 DIAGNOSIS — R6510 Systemic inflammatory response syndrome (SIRS) of non-infectious origin without acute organ dysfunction: Secondary | ICD-10-CM

## 2017-03-17 DIAGNOSIS — R109 Unspecified abdominal pain: Secondary | ICD-10-CM | POA: Diagnosis present

## 2017-03-17 DIAGNOSIS — E872 Acidosis: Secondary | ICD-10-CM | POA: Diagnosis present

## 2017-03-17 DIAGNOSIS — M869 Osteomyelitis, unspecified: Secondary | ICD-10-CM | POA: Diagnosis present

## 2017-03-17 DIAGNOSIS — M8668 Other chronic osteomyelitis, other site: Secondary | ICD-10-CM | POA: Diagnosis not present

## 2017-03-17 DIAGNOSIS — Z681 Body mass index (BMI) 19 or less, adult: Secondary | ICD-10-CM | POA: Diagnosis not present

## 2017-03-17 DIAGNOSIS — G8220 Paraplegia, unspecified: Secondary | ICD-10-CM | POA: Diagnosis present

## 2017-03-17 DIAGNOSIS — Z87891 Personal history of nicotine dependence: Secondary | ICD-10-CM | POA: Diagnosis not present

## 2017-03-17 DIAGNOSIS — M62562 Muscle wasting and atrophy, not elsewhere classified, left lower leg: Secondary | ICD-10-CM | POA: Diagnosis not present

## 2017-03-17 DIAGNOSIS — R7989 Other specified abnormal findings of blood chemistry: Secondary | ICD-10-CM

## 2017-03-17 DIAGNOSIS — R945 Abnormal results of liver function studies: Secondary | ICD-10-CM

## 2017-03-17 DIAGNOSIS — D638 Anemia in other chronic diseases classified elsewhere: Secondary | ICD-10-CM | POA: Diagnosis present

## 2017-03-17 DIAGNOSIS — R64 Cachexia: Secondary | ICD-10-CM | POA: Diagnosis present

## 2017-03-17 DIAGNOSIS — Z960 Presence of urogenital implants: Secondary | ICD-10-CM | POA: Diagnosis not present

## 2017-03-17 DIAGNOSIS — L89159 Pressure ulcer of sacral region, unspecified stage: Secondary | ICD-10-CM | POA: Diagnosis not present

## 2017-03-17 DIAGNOSIS — K5909 Other constipation: Secondary | ICD-10-CM | POA: Diagnosis present

## 2017-03-17 DIAGNOSIS — Z936 Other artificial openings of urinary tract status: Secondary | ICD-10-CM

## 2017-03-17 DIAGNOSIS — M62561 Muscle wasting and atrophy, not elsewhere classified, right lower leg: Secondary | ICD-10-CM | POA: Diagnosis not present

## 2017-03-17 DIAGNOSIS — L89154 Pressure ulcer of sacral region, stage 4: Secondary | ICD-10-CM | POA: Diagnosis present

## 2017-03-17 DIAGNOSIS — L8994 Pressure ulcer of unspecified site, stage 4: Secondary | ICD-10-CM | POA: Diagnosis present

## 2017-03-17 LAB — URINALYSIS, ROUTINE W REFLEX MICROSCOPIC
Bilirubin Urine: NEGATIVE
Glucose, UA: 50 mg/dL — AB
Ketones, ur: 5 mg/dL — AB
Nitrite: NEGATIVE
PROTEIN: 100 mg/dL — AB
SPECIFIC GRAVITY, URINE: 1.012 (ref 1.005–1.030)
pH: 7 (ref 5.0–8.0)

## 2017-03-17 LAB — PROTIME-INR
INR: 1.28
PROTHROMBIN TIME: 15.9 s — AB (ref 11.4–15.2)

## 2017-03-17 LAB — COMPREHENSIVE METABOLIC PANEL
ALBUMIN: 3.4 g/dL — AB (ref 3.5–5.0)
ALT: 220 U/L — ABNORMAL HIGH (ref 14–54)
AST: 62 U/L — AB (ref 15–41)
Alkaline Phosphatase: 1231 U/L — ABNORMAL HIGH (ref 38–126)
Anion gap: 17 — ABNORMAL HIGH (ref 5–15)
BUN: 50 mg/dL — AB (ref 6–20)
CHLORIDE: 118 mmol/L — AB (ref 101–111)
CO2: 13 mmol/L — AB (ref 22–32)
Calcium: 8.5 mg/dL — ABNORMAL LOW (ref 8.9–10.3)
Creatinine, Ser: 0.82 mg/dL (ref 0.44–1.00)
GFR calc Af Amer: >60 mL/min (ref >60)
GFR calc non Af Amer: >60 mL/min (ref >60)
GLUCOSE: 129 mg/dL — AB (ref 65–99)
POTASSIUM: 4.5 mmol/L (ref 3.5–5.1)
Sodium: 148 mmol/L — ABNORMAL HIGH (ref 135–145)
Total Bilirubin: 0.5 mg/dL (ref 0.3–1.2)
Total Protein: 7.8 g/dL (ref 6.5–8.1)

## 2017-03-17 LAB — CBC WITH DIFFERENTIAL/PLATELET
Basophils Absolute: 0 10*3/uL (ref 0.0–0.1)
Basophils Relative: 0 %
EOS PCT: 0 %
Eosinophils Absolute: 0 10*3/uL (ref 0.0–0.7)
HEMATOCRIT: 21.4 % — AB (ref 36.0–46.0)
Hemoglobin: 6.8 g/dL — CL (ref 12.0–15.0)
LYMPHS ABS: 1.1 10*3/uL (ref 0.7–4.0)
Lymphocytes Relative: 9 %
MCH: 27.4 pg (ref 26.0–34.0)
MCHC: 31.8 g/dL (ref 30.0–36.0)
MCV: 86.3 fL (ref 78.0–100.0)
MONOS PCT: 6 %
Monocytes Absolute: 0.8 10*3/uL (ref 0.1–1.0)
NEUTROS ABS: 10.7 10*3/uL — AB (ref 1.7–7.7)
Neutrophils Relative %: 85 %
Platelets: 702 10*3/uL — ABNORMAL HIGH (ref 150–400)
RBC: 2.48 MIL/uL — AB (ref 3.87–5.11)
RDW: 22.9 % — AB (ref 11.5–15.5)
WBC: 12.6 10*3/uL — ABNORMAL HIGH (ref 4.0–10.5)

## 2017-03-17 LAB — I-STAT CG4 LACTIC ACID, ED
LACTIC ACID, VENOUS: 2.05 mmol/L — AB (ref 0.5–1.9)
LACTIC ACID, VENOUS: 2.24 mmol/L — AB (ref 0.5–1.9)

## 2017-03-17 LAB — ACETAMINOPHEN LEVEL: Acetaminophen (Tylenol), Serum: <10 ug/mL — ABNORMAL LOW (ref 10–30)

## 2017-03-17 LAB — I-STAT BETA HCG BLOOD, ED (MC, WL, AP ONLY)

## 2017-03-17 LAB — MRSA PCR SCREENING: MRSA by PCR: POSITIVE — AB

## 2017-03-17 LAB — INFLUENZA PANEL BY PCR (TYPE A & B)
Influenza A By PCR: NEGATIVE
Influenza B By PCR: NEGATIVE

## 2017-03-17 LAB — APTT: APTT: 36 s (ref 24–36)

## 2017-03-17 LAB — ABO/RH: ABO/RH(D): AB POS

## 2017-03-17 LAB — LIPASE, BLOOD: LIPASE: 128 U/L — AB (ref 11–51)

## 2017-03-17 LAB — PREPARE RBC (CROSSMATCH)

## 2017-03-17 LAB — HIV ANTIBODY (ROUTINE TESTING W REFLEX): HIV Screen 4th Generation wRfx: NONREACTIVE

## 2017-03-17 LAB — LACTIC ACID, PLASMA: Lactic Acid, Venous: 2.4 mmol/L (ref 0.5–1.9)

## 2017-03-17 LAB — POC OCCULT BLOOD, ED: FECAL OCCULT BLD: POSITIVE — AB

## 2017-03-17 MED ORDER — SODIUM CHLORIDE 0.9 % IV BOLUS (SEPSIS)
1000.0000 mL | Freq: Once | INTRAVENOUS | Status: AC
  Administered 2017-03-17: 1000 mL via INTRAVENOUS

## 2017-03-17 MED ORDER — SODIUM CHLORIDE 0.9 % IV SOLN
1000.0000 mL | INTRAVENOUS | Status: DC
  Administered 2017-03-17 – 2017-03-18 (×3): 1000 mL via INTRAVENOUS

## 2017-03-17 MED ORDER — PIPERACILLIN-TAZOBACTAM 3.375 G IVPB 30 MIN
3.3750 g | Freq: Once | INTRAVENOUS | Status: AC
  Administered 2017-03-17: 3.375 g via INTRAVENOUS
  Filled 2017-03-17: qty 50

## 2017-03-17 MED ORDER — BACLOFEN 20 MG PO TABS
20.0000 mg | ORAL_TABLET | Freq: Four times a day (QID) | ORAL | Status: DC
  Administered 2017-03-17 – 2017-03-22 (×19): 20 mg via ORAL
  Filled 2017-03-17 (×21): qty 1

## 2017-03-17 MED ORDER — PIPERACILLIN-TAZOBACTAM 3.375 G IVPB
3.3750 g | Freq: Three times a day (TID) | INTRAVENOUS | Status: DC
  Administered 2017-03-17 – 2017-03-18 (×3): 3.375 g via INTRAVENOUS
  Filled 2017-03-17 (×4): qty 50

## 2017-03-17 MED ORDER — SODIUM CHLORIDE 0.9 % IV BOLUS (SEPSIS)
500.0000 mL | Freq: Once | INTRAVENOUS | Status: AC
  Administered 2017-03-17: 500 mL via INTRAVENOUS

## 2017-03-17 MED ORDER — SODIUM CHLORIDE 0.9 % IV BOLUS (SEPSIS): 1000.0000 mL | Freq: Once | INTRAVENOUS | Status: DC

## 2017-03-17 MED ORDER — PRO-STAT SUGAR FREE PO LIQD
30.0000 mL | Freq: Two times a day (BID) | ORAL | Status: DC
  Administered 2017-03-18 – 2017-03-20 (×6): 30 mL via ORAL
  Filled 2017-03-17 (×10): qty 30

## 2017-03-17 MED ORDER — SODIUM CHLORIDE 0.9 % IV SOLN
10.0000 mL/h | Freq: Once | INTRAVENOUS | Status: AC
  Administered 2017-03-17: 10 mL/h via INTRAVENOUS

## 2017-03-17 MED ORDER — COLLAGENASE 250 UNIT/GM EX OINT
TOPICAL_OINTMENT | Freq: Every day | CUTANEOUS | Status: DC
  Administered 2017-03-18 – 2017-03-22 (×5): via TOPICAL
  Filled 2017-03-17 (×3): qty 30

## 2017-03-17 MED ORDER — BISACODYL 5 MG PO TBEC
10.0000 mg | DELAYED_RELEASE_TABLET | Freq: Every day | ORAL | Status: DC
  Administered 2017-03-18 – 2017-03-22 (×4): 10 mg via ORAL
  Filled 2017-03-17 (×4): qty 2

## 2017-03-17 MED ORDER — KETOROLAC TROMETHAMINE 30 MG/ML IJ SOLN
30.0000 mg | Freq: Once | INTRAMUSCULAR | Status: AC
  Administered 2017-03-17: 30 mg via INTRAVENOUS
  Filled 2017-03-17: qty 1

## 2017-03-17 MED ORDER — ONDANSETRON HCL 4 MG PO TABS: 4.0000 mg | ORAL_TABLET | Freq: Four times a day (QID) | ORAL | Status: DC | PRN

## 2017-03-17 MED ORDER — SODIUM CHLORIDE 0.9 % IV BOLUS (SEPSIS)
500.0000 mL | Freq: Once | INTRAVENOUS | Status: AC
Start: 2017-03-17 — End: 2017-03-17
  Administered 2017-03-17: 500 mL via INTRAVENOUS

## 2017-03-17 MED ORDER — MIDODRINE HCL 5 MG PO TABS
2.5000 mg | ORAL_TABLET | Freq: Three times a day (TID) | ORAL | Status: DC
  Administered 2017-03-17 – 2017-03-22 (×15): 2.5 mg via ORAL
  Filled 2017-03-17 (×17): qty 1

## 2017-03-17 MED ORDER — MUPIROCIN 2 % EX OINT
1.0000 "application " | TOPICAL_OINTMENT | Freq: Two times a day (BID) | CUTANEOUS | Status: AC
  Administered 2017-03-18 – 2017-03-22 (×10): 1 via NASAL
  Filled 2017-03-17 (×4): qty 22

## 2017-03-17 MED ORDER — IBUPROFEN 200 MG PO TABS
200.0000 mg | ORAL_TABLET | Freq: Three times a day (TID) | ORAL | Status: DC
  Administered 2017-03-17 – 2017-03-22 (×14): 200 mg via ORAL
  Filled 2017-03-17 (×14): qty 1

## 2017-03-17 MED ORDER — ONDANSETRON HCL 4 MG/2ML IJ SOLN: 4.0000 mg | Freq: Four times a day (QID) | INTRAMUSCULAR | Status: DC | PRN

## 2017-03-17 MED ORDER — SODIUM CHLORIDE 0.9 % IV SOLN
1000.0000 mL | INTRAVENOUS | Status: DC
  Administered 2017-03-17: 1000 mL via INTRAVENOUS

## 2017-03-17 MED ORDER — POLYETHYLENE GLYCOL 3350 17 G PO PACK
17.0000 g | PACK | Freq: Two times a day (BID) | ORAL | Status: DC
  Administered 2017-03-17 – 2017-03-22 (×8): 17 g via ORAL
  Filled 2017-03-17 (×9): qty 1

## 2017-03-17 MED ORDER — DOCUSATE SODIUM 100 MG PO CAPS
100.0000 mg | ORAL_CAPSULE | Freq: Two times a day (BID) | ORAL | Status: DC
  Administered 2017-03-17 – 2017-03-22 (×10): 100 mg via ORAL
  Filled 2017-03-17 (×10): qty 1

## 2017-03-17 MED ORDER — MORPHINE SULFATE (PF) 4 MG/ML IV SOLN: 2.0000 mg | INTRAVENOUS | Status: DC | PRN

## 2017-03-17 MED ORDER — CHLORHEXIDINE GLUCONATE CLOTH 2 % EX PADS
6.0000 | MEDICATED_PAD | Freq: Every day | CUTANEOUS | Status: AC
  Administered 2017-03-18 – 2017-03-22 (×5): 6 via TOPICAL

## 2017-03-17 MED ORDER — FAMOTIDINE 20 MG PO TABS
20.0000 mg | ORAL_TABLET | ORAL | Status: DC
  Administered 2017-03-18 – 2017-03-22 (×5): 20 mg via ORAL
  Filled 2017-03-17 (×5): qty 1

## 2017-03-17 MED ORDER — VANCOMYCIN HCL IN DEXTROSE 1-5 GM/200ML-% IV SOLN
1000.0000 mg | Freq: Once | INTRAVENOUS | Status: AC
  Administered 2017-03-17: 1000 mg via INTRAVENOUS
  Filled 2017-03-17: qty 200

## 2017-03-17 MED ORDER — VANCOMYCIN HCL IN DEXTROSE 750-5 MG/150ML-% IV SOLN
750.0000 mg | INTRAVENOUS | Status: DC
  Administered 2017-03-18: 750 mg via INTRAVENOUS
  Filled 2017-03-17: qty 150

## 2017-03-17 MED ORDER — ACETAMINOPHEN 500 MG PO TABS
1000.0000 mg | ORAL_TABLET | Freq: Three times a day (TID) | ORAL | Status: DC
  Administered 2017-03-17 – 2017-03-19 (×5): 1000 mg via ORAL
  Filled 2017-03-17 (×5): qty 2

## 2017-03-17 MED ORDER — ACETAMINOPHEN 650 MG RE SUPP: 650.0000 mg | Freq: Once | RECTAL | Status: DC

## 2017-03-17 NOTE — ED Triage Notes (Signed)
Pt. Via EMS from [facility] after reports of altered mental status and abdominal pain starting last night. Per EMS pt. Is normally A/O X4 but is not able to answer questions. Pt. Has hx. Of UTI and sepsis.   175 CBG

## 2017-03-17 NOTE — Progress Notes (Signed)
Pharmacy Antibiotic Note  Deanna Baldwin is a 42 y.o. female with MS/spastic paraplegia admitted on 03/17/2017 with AMS/sepsis.  Pharmacy has been consulted for Vancomycin and Zosyn  Dosing.  Vancomycin 1 g IV given in ED at  0700  Plan: Vancomycin 750 mg IV q24h Zosyn 3.375 g IV q8h      Temp (24hrs), Avg:99.3 F (37.4 C), Min:99.3 F (37.4 C), Max:99.3 F (37.4 C)  Recent Labs  Lab 03/17/17 0557 03/17/17 0609  WBC 12.6*  --   CREATININE 0.82  --   LATICACIDVEN  --  2.05*    CrCl cannot be calculated (Unknown ideal weight.).    No Known Allergies   Deanna Baldwin 03/17/2017 7:50 AM

## 2017-03-17 NOTE — H&P (Signed)
History and Physical    Deanna Baldwin SWN:[id number] DOB: 04-11-1975 DOA: 03/17/2017  PCP: [name], DO   Consultants: none  Patient coming from: SNF  Chief Complaint:AMS and fever  HPI: Deanna Baldwin is a 42 y.o. female with medical history significant of MS with spastic paraplegia, neurogenic bladder -s/p urostomy with frequent UTI and urosepsis, decubitus ulcer, anemia, chronic constipation, hemorrhoids who was brought to the ED with AMS, tachycardia and fever.  Patient is a poor historian and a base line able to answer simple questions, however, during my assessment she was constantly groaning and complaining of headache, sore throat and allover body ache.  ED Course: her rectal temp on arrival was 99.3 F, she was tachypneic with respirations of 28, pulse 128, sugar 103/60 White blood cells count was 12.6, hemoglobin 6.8, platelet count 702, sodium 148, BUN 15, lactic acid 2.05 UA was nitrite negative, showed large amount of red blood cells but 0-5 leukocytes and 0-5 squamous epithelial cells with present budding yeasts and large amount of bacteria Chest x-ray revealed normal cardiomediastinal silhouette with elevated left hemidiaphragm and hyperinflation. Stool was guaiac positive x2 in the ED and on external rectal exam patient was noted to have large hemorrhoids  Review of Systems: As per HPI; otherwise patient unable to provide meaningful ROS     Ambulatory Status- bedridden  Past Medical History:  Diagnosis Date  . Buttock wound 03/03/2016  . Dysrhythmia    tachycardia  . MS (multiple sclerosis) ([place])   . Neutropenia ([place])   . Pneumonia 02/2016  . Protein calorie malnutrition ([place])   . Severe sepsis ([place]) 03/03/2016  . 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 UTI (urinary tract infection) 02/2016    Past Surgical History:  Procedure Laterality Date  . DIVERTING ILEOSTOMY N/A 09/03/2016   Procedure: ILEAL CONDUIT  URINARY DIVERSION OPEN;   Surgeon: Ardis Hughs, MD;  Location: [facility];  Service: Urology;  Laterality: N/A;  . NO PAST SURGERIES      Social History   Socioeconomic History  . Marital status: Single    Spouse name: Not on file  . Number of children: Not on file  . Years of education: Not on file  . Highest education level: Not on file  Social Needs  . Financial resource strain: Not on file  . Food insecurity - worry: Not on file  . Food insecurity - inability: Not on file  . Transportation needs - medical: Not on file  . Transportation needs - non-medical: Not on file  Occupational History  . Occupation: Disabled  Tobacco Use  . Smoking status: Former Smoker    Packs/day: 1.00    Years: 10.00    Pack years: 10.00    Types: Cigarettes    Last attempt to quit: 02/01/2010    Years since quitting: 7.1  . Smokeless tobacco: Never Used  Substance and Sexual Activity  . Alcohol use: No  . Drug use: No  . Sexual activity: Not on file  Other Topics Concern  . Not on file  Social History Narrative   She is a resident at [facility].   Right-handed.    No Known Allergies  Family History  Problem Relation Age of Onset  . Mental illness Sister     Prior to Admission medications   Medication Sig Start Date End Date Taking? Authorizing Provider  acetaminophen (TYLENOL) 500 MG tablet Take 1,000 mg by mouth 3 (three) times daily.  [provider]  Amino Acids-Protein Hydrolys (FEEDING SUPPLEMENT, PRO-STAT SUGAR FREE 64,) LIQD Take 30 mLs by mouth 2 (two) times daily. 09/10/16   Elgergawy, Silver Huguenin, MD  baclofen (LIORESAL) 20 MG tablet Take 20 mg by mouth 4 (four) times daily.    [provider]  bisacodyl (DULCOLAX) 10 MG suppository Place 1 suppository (10 mg total) rectally daily as needed for mild constipation or moderate constipation. 09/10/16   Elgergawy, Silver Huguenin, MD  bisacodyl (DULCOLAX) 5 MG EC tablet Take 5 mg by mouth daily.    [provider]    Cyanocobalamin (B-12 PO) Take 1 tablet by mouth daily.    [provider]  diclofenac sodium (VOLTAREN) 1 % GEL Apply 2 g topically 3 (three) times daily. Apply to Left shoulder    [provider]  famotidine (PEPCID) 20 MG tablet Take 20 mg by mouth every morning.    [provider]  ibuprofen (ADVIL,MOTRIN) 200 MG tablet Take 200 mg by mouth 3 (three) times daily.    [provider]  midodrine (PROAMATINE) 2.5 MG tablet Take 1 tablet (2.5 mg total) by mouth 3 (three) times daily with meals. 09/10/16   Elgergawy, Silver Huguenin, MD  Multiple Vitamins-Minerals (DECUBI-VITE) CAPS Take 1 capsule by mouth daily.    [provider]  Nutritional Supplements (NUTRITIONAL SUPPLEMENT PO) House  Supplement - Give 120cc by mouth three times daily    [provider]  ondansetron (ZOFRAN) 4 MG tablet Take 4 mg by mouth every 6 (six) hours as needed for nausea or vomiting.    [provider]  polyethylene glycol (MIRALAX / GLYCOLAX) packet Take 17 g by mouth daily.    [provider]  vitamin C (ASCORBIC ACID) 500 MG tablet Take 500 mg by mouth 2 (two) times daily.    [provider]    Physical Exam: Vitals:   03/17/17 0615 03/17/17 0630 03/17/17 0645 03/17/17 0715  BP: 102/63 100/62 99/65 107/66  Pulse: (!) 115 98 (!) 128 (!) 121  Resp: (!) 21 (!) 22 (!) 28 (!) 27  Temp:      TempSrc:      SpO2: 98% 98% 100% 98%     General:  Appears moderate discomfort grunting and complaints of pain, looking cachectic Eyes: PERRLA, sckerae unicteric, EOM intact,  ENT: normal external ear canals, oral mucous membranes dry with parched lips, no nasal discharge Neck: no lympnadenopathy, masses or thyromegaly Cardiovascular: Rapid rate and regular rhythm, no murmurs, gallops or rubs, no LE edema. Respiratory: Tachypneic, CTA bilaterally, no adventitious sounds auscultated. Normal respiratory effort. Abdomen: soft, NT / ND, BS (+) 4, no  masses or organomegaly appreciated, urostomy bag present and is draining cloudy looking urine Skin: no rash or induration seen on limited exam, right gluteus region decubitus ulcer Musculoskeletal: multiple upper and lower extremities contractures observed Psychiatric: confused, constantly groans and repeats "yes baby-yes baby" Neurologic: no facial droop, does not follow commands to perform full assessment     Radiological Exams on Admission: Dg Chest Port 1 View  Result Date: 03/17/2017 CLINICAL DATA:  Altered mental status, abdominal pain beginning last night. History of multiple sclerosis. EXAM: PORTABLE CHEST 1 VIEW COMPARISON:  Chest radiograph August 31, 2016 FINDINGS: Cardiomediastinal silhouette is normal. Elevated LEFT hemidiaphragm with juxta peaking. Hyperinflation. Apical pleural thickening. No pneumothorax. Osteopenia. Cachexia. IMPRESSION: LEFT lung base juxtapeaking seen with scarring or subpulmonic effusion. Stable hyperinflation. Electronically Signed   By: Elon Alas M.D.   On:  03/17/2017 06:27   EKG: Independently reviewed.  Sinus tachy with LVH and possible early repolarization  Labs on Admission: I have personally reviewed the available labs and imaging studies at the time of the admission.  Pertinent labs:  White blood cells count was 12.6, hemoglobin 6.8, platelet count 702, sodium 148, BUN 15, lactic acid 2.05  Assessment/Plan Principal Problem:   Sepsis (HCC) Active Problems:   Protein-calorie malnutrition, severe (HCC)   Dysphagia, oral phase   Anemia of chronic disease   Neurogenic bladder   Pressure ulcer, stage 4 (HCC)   Chronic pain   Chronic constipation   Paraplegia (HCC)   Sepsis of unclear etiology - patient meets criteria for sepsis given her white blood cell count tachycardia, tachypnea and elevated lactic acid.  However, her urinalysis and chest x-ray are unremarkable. Could be a viral infection and influenza PCR submitted She received IV  fluid boluses in the ED, will continue with maintenance IV fluids and follow culture results  Anemia of chronic disease, now with critical hemoglobin 6.8 in setting of severe dehydration Most likely associated with bleeding hemorrhoids Patient will be transfused 1 unit of red blood cells and then will follow H&H  Dehydration with mild hypernatremia/hyperchloremia - we will reassess her light post hydration  Neurogenic bladder status post permanent urostomy  Urostomy site does not reveal any signs of skin infection Continue to monitor urinary output  Multiple sclerosis with spastic paraplegia  Patient has decubitus ulcer associated with bed confinement Continue supportive care, ask wound care to assist patient and made with dressing changes of the decubitus ulcer Will ask for nutritional consult given her cachectic state We will ask speech therapy to perform swallow eval as patient has history of dysphasia and will need to determine diet  Chronic constipation  Will start bowel regimen with Colace Dulcolax and MiraLAX   DVT prophylaxis: SCDs Code Status: Full - confirmed with patient/family Family Communication:  None Disposition Plan: SNF Consults called: none Admission status: inpatient   York Grice PA-C [phone] [facility]  If note is complete, please contact covering daytime or nighttime physician. [email or web address] Password [email or web address]  03/17/2017, 8:30 AM

## 2017-03-17 NOTE — ED Notes (Signed)
Lab results reported to Dr. Rees 

## 2017-03-17 NOTE — ED Provider Notes (Addendum)
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  Unknown last seen normal.  Patient normally able to carry a conversation.  Tachycardic with EMS.  Rectal temp of 99.  Patient denies any pain at this time.  ROS: Level 5 caveat for altered mental status  PAST MEDICAL HISTORY/PAST SURGICAL HISTORY:  Past Medical History:  Diagnosis Date  . Buttock wound 03/03/2016  . Dysrhythmia    tachycardia  . MS (multiple sclerosis) ([place])   . Neutropenia ([place])   . Pneumonia 02/2016  . Protein calorie malnutrition ([place])   . Severe sepsis ([place]) 03/03/2016  . Spastic 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 ([place])   . UTI (urinary tract infection) 02/2016    MEDICATIONS:  Prior to Admission medications   Medication Sig Start Date End Date Taking? Authorizing Provider  acetaminophen (TYLENOL) 500 MG tablet Take 1,000 mg by mouth 3 (three) times daily.     [provider]  Amino Acids-Protein Hydrolys (FEEDING SUPPLEMENT, PRO-STAT SUGAR FREE 64,) LIQD Take 30 mLs by mouth 2 (two) times daily. 09/10/16   Elgergawy, Silver Huguenin, MD  baclofen (LIORESAL) 20 MG tablet Take 20 mg by mouth 4 (four) times daily.    [provider]  bisacodyl (DULCOLAX) 10 MG suppository Place 1 suppository (10 mg total) rectally daily as needed for mild constipation or moderate constipation. 09/10/16   Elgergawy, Silver Huguenin, MD  bisacodyl (DULCOLAX) 5 MG EC tablet Take 5 mg by mouth daily.    [provider]  Cyanocobalamin (B-12 PO) Take 1 tablet by mouth daily.    [provider]  diclofenac sodium (VOLTAREN) 1 % GEL Apply 2 g topically 3 (three) times daily. Apply to Left shoulder    [provider]  famotidine (PEPCID) 20 MG tablet Take 20 mg by mouth every morning.    [provider]  ibuprofen (ADVIL,MOTRIN) 200 MG tablet Take 200 mg by mouth 3 (three) times daily.    [provider]  midodrine (PROAMATINE) 2.5 MG tablet Take 1 tablet (2.5 mg total) by mouth 3 (three) times daily with meals. 09/10/16   Elgergawy, Silver Huguenin, MD  Multiple Vitamins-Minerals (DECUBI-VITE) CAPS Take 1 capsule by mouth daily.    [provider]  Nutritional Supplements (NUTRITIONAL SUPPLEMENT PO) House  Supplement - Give 120cc by mouth three times daily    [provider]  ondansetron (ZOFRAN) 4 MG tablet Take 4 mg by mouth every 6 (six) hours as needed for nausea or vomiting.    [provider]  polyethylene glycol (MIRALAX / GLYCOLAX) packet Take 17 g by mouth daily.    [provider]  vitamin C (ASCORBIC ACID) 500 MG tablet Take 500 mg by mouth 2 (two) times daily.    [provider]    ALLERGIES:  No Known Allergies  SOCIAL HISTORY:  Social History   Tobacco Use  . Smoking status: Former Smoker    Packs/day: 1.00    Years: 10.00    Pack years: 10.00    Types: Cigarettes    Last attempt to quit: 02/01/2010    Years since quitting: 7.1  . Smokeless tobacco: Never Used  Substance Use Topics  . Alcohol use: No    FAMILY HISTORY: Family History  Problem Relation Age of Onset  . Mental illness Sister  EXAM: BP 107/66   Pulse (!) 121   Temp 99.3 F (37.4 C) (Rectal)   Resp (!) 27   LMP 01/28/2016   SpO2 98%  CONSTITUTIONAL: Alert and oriented to person but not place or time.  Does not answer questions appropriately.  Thin.  Afebrile. HEAD: Normocephalic EYES: Conjunctivae clear, pupils appear equal, EOMI ENT: normal nose; extremely dry t mucous membranes NECK: Supple, no meningismus, no nuchal rigidity, no LAD  CARD: Regular and tachycardic; S1 and S2 appreciated; no murmurs, no clicks, no rubs, no gallops RESP: Normal chest excursion without splinting; vision is tachypneic, breath sounds clear and equal  bilaterally; no wheezes, no rhonchi, no rales, no hypoxia or respiratory distress, speaking full sentences ABD/GI: Normal bowel sounds; non-distended; soft, non-tender, no rebound, no guarding, no peritoneal signs, no hepatosplenomegaly, urostomy in the right abdomen with cloudy appearing urine RECTAL: Slightly diminished rectal tone, patient does have a small amount of gross red blood on exam but no melena, guaiac positive, multiple external hemorrhoids appreciated, large amount of hard brown stool in the rectal vault, sacral decubitus wound that does not appear infected BACK:  The back appears normal and is non-tender to palpation, there is no CVA tenderness EXT: Normal ROM in all joints; non-tender to palpation; no edema; normal capillary refill; no cyanosis, no calf tenderness or swelling, significant diffuse muscle atrophy SKIN: Normal color for age and race; warm; no rash NEURO: Moves all extremities equally, does not answer questions or follow commands appropriately, opens her eyes spontaneously, is able to talk and answer some questions, spasticity of all 4 extremities  MEDICAL DECISION MAKING: Patient here as a possible code sepsis.  She is tachycardic, tachypneic with rectal temp of 99.3.  Will obtain labs, cultures, urine, chest x-ray, flu swab.  Will give broad-spectrum antibiotics and IV fluids.  She does appear very dry on exam.  Anticipate admission.  ED PROGRESS: Patient's labs show leukocytosis with left shift.  Hemoglobin is 6.8.  She does have gross red blood on rectal exam but no hemorrhage.  Blood likely coming from hemorrhoids.  Will give 1 unit packed red blood cells.  Will send type and screen and coags.  Patient has elevated sodium and chloride level.  Decreased bicarb.  Suspect significant dehydration.  We will continue to hydrate her aggressively.  Lactate mildly elevated at 2.05.  Chest x-ray shows no infiltrate or edema.  She does have elevation of her LFTs that have slowly  been rising.  She did have a hepatitis panel done in December but I cannot see these results.  Patient had a right upper quadrant ultrasound and July 2018 that was normal.  Will obtain right upper quadrant ultrasound today.  Her abdominal exam is benign.  Will discuss with medicine for admission for possible sepsis, symptomatic anemia, dehydration.    7:44 AM Discussed patient's case with hospitalist.  I have recommended admission and patient (and family if present) agree with this plan. Admitting physician will place admission orders.   I reviewed all nursing notes, vitals, pertinent previous records, EKGs, lab and urine results, imaging (as available).  8:25 AM  Pt Has 2 peripheral IVs and receiving IV fluids.  Initial labs and culture x1 has been sent.  Second culture was obtained immediately after antibiotics complete given patient very difficult stick.  Multiple staff members try to obtain blood including phlebotomy, RT, nursing staff.  I was able to obtain a small amount of blood from the left femoral vein but again this  is very difficult given she has spastic paraplegia.  I feel she can be hydrated and then they can attempt to draw more blood at a later time.  If unable to obtain type and screen, have instructed nurse that she can be given O- blood.  Antibiotics have been completed.  Blood pressures are stable.  Heart rate slowly improving.  Patient complaining of pain all over.  Given dose of Tylenol.  Right upper quadrant ultrasound ordered.  This can be followed up by the hospitalist service.    EKG Interpretation  Date/Time:  Thursday March 17 2017 05:35:32 EST Ventricular Rate:  121 PR Interval:    QRS Duration: 93 QT Interval:  363 QTC Calculation: 515 R Axis:   -41 Text Interpretation:  Sinus tachycardia LVH with secondary repolarization abnormality Prolonged QT interval Artifact in lead(s) II III aVL aVF V2 V3 V4 V5 V6 No significant change since last tracing Confirmed by Pryor Curia [phone]) on 03/17/2017 5:50:11 AM        CRITICAL CARE Performed by: Cyril Mourning Jae Skeet   Total critical care time: 45 minutes  Critical care time was exclusive of separately billable procedures and treating other patients.  Critical care was necessary to treat or prevent imminent or life-threatening deterioration.  Critical care was time spent personally by me on the following activities: development of treatment plan with patient and/or surrogate as well as nursing, discussions with consultants, evaluation of patient's response to treatment, examination of patient, obtaining history from patient or surrogate, ordering and performing treatments and interventions, ordering and review of laboratory studies, ordering and review of radiographic studies, pulse oximetry and re-evaluation of patient's condition.    Mariyanna Mucha, Delice Bison, DO 03/17/17 0746    Burnell Hurta, Delice Bison, DO 03/17/17 9147

## 2017-03-17 NOTE — ED Notes (Addendum)
RN and IV team were unable to get second set of cultures.

## 2017-03-17 NOTE — Consult Note (Signed)
[place] Nurse wound consult note Reason for Consult: pressure injuries  Patient well known to the [facility] nurse team. Chronic wounds, they have declined since our last assessment. She is not her self today, not able to really converse like normal. Picking at her lines and clothes. Definitely altered mentally.  Wound type: Sacrum: Stage 4 pressure injury; bone not exposed but palpable  Right ischium: Stage 3 pressure injury Left ischium: Stage 2 pressure injury Left ischial region proximal: deep tissue injury Pressure Injury POA: Yes  Measurement: Sacrum: 6cm x 4cm x 0.1cm  Right ischium: 4cm x 2cm x 0.5cm  Left ischium: 7cm x 3cm x 0.5cm  Left ischial proximal: 2cm x 1cm x 0cm   Wound bed: Sacrum: 80% pale/20% pink  Right ischium: 100% clean, pink, non granular,  Left ischium: 90% clean, pink, non granular, 10% dark at proximal wound edge Left proximal: dark, purple, non blanchable tissue, intact    Drainage (amount, consistency, odor) heavy drainage from the left and right ischial wounds and the sacrum, however the wounds are not extensively necrotic. Therefore I have suggested to bedside nurse in ED to discuss with MD consider MRI/CT pelvis to rule out osteomyelitis.  Periwound:intact, patient is malnourished.   Dressing procedure/placement/frequency:  Enzymatic debridement ointment to the sacral wound to clean necrotic tissue. Saline topper Silver hydrofiber to the right and left ischial wounds for recalcitrant wounds with bioburdan. To also manage moderate levels of exudate.  Low air loss mattress for pressure redistribution and moisture management Her feet and LE are healed at this time and due to her contractures she is not really a candidate for use of Prevalon boots.    [place] Nurse ostomy consult note Stoma type/location: RLQ, ileal conduit  Stomal assessment/size: 1 inch round, budded from skin  Peristomal assessment: NA Treatment options for stomal/peristomal skin: NA Output  yellow urine with sediment, normal for ileal conduit  Ostomy pouching: 1pc Long term urostomy, patient totally dependent on care for her ostomy. Once she has been admitted into a room I will follow up with her for supplies needed and any support.  [place] nurse will follow along for support with wound and ostomy care. [place], [place], [place]

## 2017-03-17 NOTE — Telephone Encounter (Signed)
I called the patient to get her scheduled for her next injection. She did not answer so I left a VM asking her to call me back.

## 2017-03-18 ENCOUNTER — Other Ambulatory Visit: Payer: Self-pay

## 2017-03-18 ENCOUNTER — Encounter (HOSPITAL_COMMUNITY): Payer: Self-pay | Admitting: Internal Medicine

## 2017-03-18 DIAGNOSIS — E860 Dehydration: Secondary | ICD-10-CM

## 2017-03-18 DIAGNOSIS — L89154 Pressure ulcer of sacral region, stage 4: Secondary | ICD-10-CM

## 2017-03-18 DIAGNOSIS — G114 Hereditary spastic paraplegia: Secondary | ICD-10-CM

## 2017-03-18 DIAGNOSIS — Z87891 Personal history of nicotine dependence: Secondary | ICD-10-CM

## 2017-03-18 DIAGNOSIS — R4182 Altered mental status, unspecified: Secondary | ICD-10-CM

## 2017-03-18 DIAGNOSIS — L89229 Pressure ulcer of left hip, unspecified stage: Secondary | ICD-10-CM

## 2017-03-18 DIAGNOSIS — G35 Multiple sclerosis: Secondary | ICD-10-CM

## 2017-03-18 DIAGNOSIS — L89159 Pressure ulcer of sacral region, unspecified stage: Secondary | ICD-10-CM

## 2017-03-18 DIAGNOSIS — M609 Myositis, unspecified: Secondary | ICD-10-CM

## 2017-03-18 DIAGNOSIS — Z960 Presence of urogenital implants: Secondary | ICD-10-CM

## 2017-03-18 DIAGNOSIS — M62562 Muscle wasting and atrophy, not elsewhere classified, left lower leg: Secondary | ICD-10-CM

## 2017-03-18 DIAGNOSIS — Z87440 Personal history of urinary (tract) infections: Secondary | ICD-10-CM

## 2017-03-18 DIAGNOSIS — E43 Unspecified severe protein-calorie malnutrition: Secondary | ICD-10-CM

## 2017-03-18 DIAGNOSIS — M86152 Other acute osteomyelitis, left femur: Secondary | ICD-10-CM

## 2017-03-18 DIAGNOSIS — Z515 Encounter for palliative care: Secondary | ICD-10-CM

## 2017-03-18 DIAGNOSIS — M62561 Muscle wasting and atrophy, not elsewhere classified, right lower leg: Secondary | ICD-10-CM

## 2017-03-18 DIAGNOSIS — R636 Underweight: Secondary | ICD-10-CM

## 2017-03-18 DIAGNOSIS — M8668 Other chronic osteomyelitis, other site: Secondary | ICD-10-CM

## 2017-03-18 DIAGNOSIS — R6510 Systemic inflammatory response syndrome (SIRS) of non-infectious origin without acute organ dysfunction: Secondary | ICD-10-CM

## 2017-03-18 DIAGNOSIS — A419 Sepsis, unspecified organism: Principal | ICD-10-CM

## 2017-03-18 LAB — HEPATIC FUNCTION PANEL
ALBUMIN: 2.5 g/dL — AB (ref 3.5–5.0)
ALT: 123 U/L — AB (ref 14–54)
AST: 33 U/L (ref 15–41)
Alkaline Phosphatase: 930 U/L — ABNORMAL HIGH (ref 38–126)
Total Bilirubin: 0.8 mg/dL (ref 0.3–1.2)
Total Protein: 6.1 g/dL — ABNORMAL LOW (ref 6.5–8.1)

## 2017-03-18 LAB — CBC
HEMATOCRIT: 27.7 % — AB (ref 36.0–46.0)
Hemoglobin: 8.8 g/dL — ABNORMAL LOW (ref 12.0–15.0)
MCH: 27.5 pg (ref 26.0–34.0)
MCHC: 31.8 g/dL (ref 30.0–36.0)
MCV: 86.6 fL (ref 78.0–100.0)
PLATELETS: 478 10*3/uL — AB (ref 150–400)
RBC: 3.2 MIL/uL — AB (ref 3.87–5.11)
RDW: 20.2 % — AB (ref 11.5–15.5)
WBC: 7.5 10*3/uL (ref 4.0–10.5)

## 2017-03-18 LAB — BPAM RBC
Blood Product Expiration Date: 201903062359
ISSUE DATE / TIME: 201902141155
Unit Type and Rh: 8400

## 2017-03-18 LAB — BASIC METABOLIC PANEL
Anion gap: 15 (ref 5–15)
BUN: 30 mg/dL — AB (ref 6–20)
CHLORIDE: 126 mmol/L — AB (ref 101–111)
CO2: 11 mmol/L — ABNORMAL LOW (ref 22–32)
Calcium: 8 mg/dL — ABNORMAL LOW (ref 8.9–10.3)
Creatinine, Ser: 0.66 mg/dL (ref 0.44–1.00)
Glucose, Bld: 102 mg/dL — ABNORMAL HIGH (ref 65–99)
POTASSIUM: 4.1 mmol/L (ref 3.5–5.1)
Sodium: 152 mmol/L — ABNORMAL HIGH (ref 135–145)

## 2017-03-18 LAB — URINE CULTURE

## 2017-03-18 LAB — HEPATITIS PANEL, ACUTE
HEP B S AG: NEGATIVE
Hep A IgM: NEGATIVE
Hep B C IgM: NEGATIVE

## 2017-03-18 LAB — TYPE AND SCREEN
ABO/RH(D): AB POS
ANTIBODY SCREEN: NEGATIVE
Unit division: 0

## 2017-03-18 LAB — LACTIC ACID, PLASMA: Lactic Acid, Venous: 0.6 mmol/L (ref 0.5–1.9)

## 2017-03-18 MED ORDER — ENSURE ENLIVE PO LIQD
237.0000 mL | Freq: Three times a day (TID) | ORAL | Status: DC
  Administered 2017-03-18 – 2017-03-22 (×10): 237 mL via ORAL

## 2017-03-18 MED ORDER — WHITE PETROLATUM EX OINT
TOPICAL_OINTMENT | CUTANEOUS | Status: AC
  Administered 2017-03-18: 18:00:00
  Filled 2017-03-18: qty 28.35

## 2017-03-18 MED ORDER — DEXTROSE-NACL 5-0.45 % IV SOLN
INTRAVENOUS | Status: DC
  Administered 2017-03-18 – 2017-03-22 (×8): via INTRAVENOUS

## 2017-03-18 NOTE — Consult Note (Signed)
Consultation Note Date: 03/18/2017   Patient Name: Deanna Baldwin  DOB: 13-Mar-1975  MRN: [id number]  Age / Sex: 42 y.o., female  PCP: [name], DO Referring Physician: Dessa Phi, DO  Reason for Consultation: Establishing goals of care and Psychosocial/spiritual support  HPI/Patient Profile: 42 y.o. female  with past medical history of multiple sclerosis diagnosed approximately 18 years ago, associated spastic paraplegia, associated neurogenic bladder status post urostomy, ileostomy  decubitus ulcers admitted on 03/17/2017 with altered mental status.  Patient has been living in a skilled nursing facility,[facility], for the past year.  Per MRI, patient was diagnosed: IMPRESSION: 1. Deep sacral decubitus ulcer with underlying osteomyelitis. 2. Deep decubitus ulcer over the left ischial tuberosity with osteomyelitis. 3. Diffuse myositis without definite findings for pyomyositis or discrete drainable soft tissue abscess. 4. No definite findings for septic arthritis. 5. 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 6. No definite intrapelvic abscess.  Patient has been seen by surgical services who are recommending antibiotics as well as hydrotherapy at this time   Consult ordered for goals of care.   Clinical Assessment and Goals of Care: Met with patient, chart reviewed.  Patient, who goes by the name of Deanna Baldwin, states she does not remember anything about coming to the hospital.  She does report feeling better today.  She recognizes that she is in the hospital but I did review with her again why she was admitted to the hospital, her wounds, infectioon.   Patient shares that she has been living in a nursing home for approximately a year.  She reports prior to that she was independent in her apartment.  She is verbalizing multiple losses secondary to her loss of independence.  Patient  has a 36 year old son, [name], who now lives in [place] with his father.  She states that he had to move in with his father approximately 2 years ago because of her inability to care for him.  She shares with me that she feels very isolated and has no friends in [facility] skilled nursing facility.  One of her biggest wishes is to be able to get out of the facility and attend church  Her sister, Zebedee Iba, is her healthcare POA [phone]  I asked patient if she ever discussed her illness with her sister, or had made a living will, and she stated no.    SUMMARY OF RECOMMENDATIONS   Continue full scope of treatment Introduced palliative medicine as a source of support, resource in the setting of the serious illness I called her sister in an effort to arrange a goals of care meeting for this weekend.  I left a message Patient would be interested in exploring alternate nursing homes closer to [facility] area which is closer to her sister, and her aunt; especially a facility that will provide transportation out of the facility periodically for church or other social outings.  Place consult to social work Code [place]:  Full code    Symptom Management:   Pain: She is currently on  scheduled ibuprofen as well as baclofen and is currently denying pain.  Would recommend a PRN of morphine IV 1-2 mg every 2 hours as needed/premedication for hydrotherapy  Palliative Prophylaxis:   Aspiration, Bowel Regimen, Delirium Protocol, Eye Care, Frequent Pain Assessment, Oral Care and Turn Reposition  Additional Recommendations (Limitations, Scope, Preferences):  Full Scope Treatment  Psycho-social/Spiritual:   Desire for further Chaplaincy support:yes  Additional Recommendations: Grief/Bereavement Support and Referral to [employer]   Prognosis:   Unable to determine  Discharge Planning: SNF      Primary Diagnoses: Present on Admission: . Sepsis ([place]) .  Neurogenic bladder . Paraplegia ([place]) . Chronic constipation . Anemia of chronic disease . Dysphagia, oral phase . Protein-calorie malnutrition, severe ([place]) . Chronic pain . Pressure ulcer, stage 4 ([place])   I have reviewed the medical record, interviewed the patient and family, and examined the patient. The following aspects are pertinent.  Past Medical History:  Diagnosis Date  . Buttock wound 03/03/2016  . Dysrhythmia    tachycardia  . MS (multiple sclerosis) ([place])   . Neutropenia ([place])   . Pneumonia 02/2016  . Protein calorie malnutrition ([place])   . Severe sepsis ([place]) 03/03/2016  . Spastic 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 ([place])   . UTI (urinary tract infection) 02/2016   Social History   Socioeconomic History  . Marital status: Single    Spouse name: None  . Number of children: None  . Years of education: None  . Highest education level: None  Social Needs  . Financial resource strain: None  . Food insecurity - worry: None  . Food insecurity - inability: None  . Transportation needs - medical: None  . Transportation needs - non-medical: None  Occupational History  . Occupation: Disabled  Tobacco Use  . Smoking status: Former Smoker    Packs/day: 1.00    Years: 10.00    Pack years: 10.00    Types: Cigarettes    Last attempt to quit: 02/01/2010    Years since quitting: 7.1  . Smokeless tobacco: Never Used  Substance and Sexual Activity  . Alcohol use: No  . Drug use: No  . Sexual activity: None  Other Topics Concern  . None  Social History Narrative   She is a resident at [facility].   Right-handed.   Family History  Problem Relation Age of Onset  . Mental illness Sister    Scheduled Meds: . acetaminophen  1,000 mg Oral TID  . baclofen  20 mg Oral QID  . bisacodyl  10 mg Oral Daily  . Chlorhexidine Gluconate Cloth  6 each Topical Q0600  . collagenase   Topical Daily  . docusate sodium  100 mg Oral BID  . famotidine  20 mg  Oral [id number]  . feeding supplement (ENSURE ENLIVE)  237 mL Oral TID BM  . feeding supplement (PRO-STAT SUGAR FREE 64)  30 mL Oral BID  . ibuprofen  200 mg Oral TID  . midodrine  2.5 mg Oral TID WC  . mupirocin ointment  1 application Nasal BID  . polyethylene glycol  17 g Oral BID  . white petrolatum       Continuous Infusions: . dextrose 5 % and 0.45% NaCl 100 mL/hr at 03/18/17 0812   PRN Meds:.morphine injection, ondansetron **OR** ondansetron (ZOFRAN) IV Medications Prior to Admission:  Prior to Admission medications   Medication Sig Start Date End Date Taking? Authorizing Provider  acetaminophen (TYLENOL) 500 MG tablet Take 1,000 mg by  mouth 3 (three) times daily.    Yes [provider]  Amino Acids-Protein Hydrolys (FEEDING SUPPLEMENT, PRO-STAT SUGAR FREE 64,) LIQD Take 30 mLs by mouth 2 (two) times daily. 09/10/16  Yes Elgergawy, Silver Huguenin, MD  baclofen (LIORESAL) 20 MG tablet Take 20 mg by mouth 4 (four) times daily.   Yes [provider]  bisacodyl (DULCOLAX) 10 MG suppository Place 1 suppository (10 mg total) rectally daily as needed for mild constipation or moderate constipation. 09/10/16  Yes Elgergawy, Silver Huguenin, MD  bisacodyl (DULCOLAX) 5 MG EC tablet Take 5 mg by mouth daily.   Yes [provider]  Cyanocobalamin (VITAMIN B-12 PO) Take 1 tablet by mouth daily.   Yes [provider]  diclofenac sodium (VOLTAREN) 1 % GEL Apply 2 g topically 3 (three) times daily. Apply to Left shoulder   Yes [provider]  famotidine (PEPCID) 20 MG tablet Take 20 mg by mouth every morning.   Yes [provider]  ibuprofen (ADVIL,MOTRIN) 200 MG tablet Take 200 mg by mouth 3 (three) times daily.   Yes [provider]  midodrine (PROAMATINE) 2.5 MG tablet Take 1 tablet (2.5 mg total) by mouth 3 (three) times daily with meals. 09/10/16  Yes Elgergawy, Silver Huguenin, MD  Multiple Vitamins-Minerals (DECUBI-VITE) CAPS Take 1 capsule by mouth daily.    Yes [provider]  ondansetron (ZOFRAN) 4 MG tablet Take 4 mg by mouth every 6 (six) hours as needed for nausea or vomiting.   Yes [provider]  polyethylene glycol (MIRALAX / GLYCOLAX) packet Take 17 g by mouth daily.   Yes [provider]  vitamin C (ASCORBIC ACID) 500 MG tablet Take 500 mg by mouth 2 (two) times daily.   Yes [provider]  Vitamin D, Ergocalciferol, (DRISDOL) 50000 units CAPS capsule Take 50,000 Units by mouth every 30 (thirty) days.   Yes [provider]   No Known Allergies Review of Systems  Unable to perform ROS: Other    Physical Exam  Constitutional: She is oriented to person, place, and time.  Frail, middle-aged female; alert and in no acute distress  Neck: Normal range of motion.  Cardiovascular: Normal rate.  Pulmonary/Chest: Effort normal.  Musculoskeletal:  Lower extremities contractures  Neurological: She is alert and oriented to person, place, and time.  Skin: Skin is warm and dry.  Wounds to sacrum  Psychiatric: Her behavior is normal.  Feels sad, isolated  Nursing note and vitals reviewed.   Vital Signs: BP 100/68 (BP Location: Right Arm)   Pulse 78   Temp 97.9 F (36.6 C) (Oral)   Resp 16   Ht _0  (1.753 m)   Wt 39 kg (86 lb)   LMP 01/28/2016   SpO2 100%   BMI 12.70 kg/m  Pain Assessment: No/denies pain   Pain Score: 0-No pain   SpO2: SpO2: 100 % O2 Device:SpO2: 100 % O2 Flow Rate: .   IO: Intake/output summary:   Intake/Output Summary (Last 24 hours) at 03/18/2017 1653 Last data filed at 03/18/2017 1356 Gross per 24 hour  Intake 0 ml  Output 1450 ml  Net -1450 ml    LBM:   Baseline Weight: Weight: 40 kg (88 lb 3.2 oz) Most recent weight: Weight: 39 kg (86 lb)     Palliative Assessment/Data:   Flowsheet Rows     Most Recent Value  Intake Tab  Referral Department  Hospitalist  Unit at Time of Referral  Med/Surg Unit  Palliative  Care Primary Diagnosis   Sepsis/Infectious Disease  Date Notified  03/17/17  Palliative Care Type  New Palliative care  Reason for referral  Clarify Goals of Care  Date of Admission  03/17/17  Date first seen by Palliative Care  03/18/17  # of days Palliative referral response time  1 Day(s)  # of days IP prior to Palliative referral  0  Clinical Assessment  Palliative Performance Scale Score  30%  Pain Max last 24 hours  0  Pain Min Last 24 hours  0  Dyspnea Max Last 24 Hours  0  Dyspnea Min Last 24 hours  0  Nausea Max Last 24 Hours  0  Nausea Min Last 24 Hours  0  Anxiety Max Last 24 Hours  4  Anxiety Min Last 24 Hours  0  Other Max Last 24 Hours  0  Psychosocial & Spiritual Assessment  Palliative Care Outcomes  Patient/Family meeting held?  Yes  Who was at the meeting?  pt  Palliative Care follow-up planned  Yes, Facility      Time In: 1600 Time Out: 1710 Time Total: 70 min Greater than 50%  of this time was spent counseling and coordinating care related to the above assessment and plan.  Signed by: Dory Horn, NP   Please contact Palliative Medicine Team phone at [phone] for questions and concerns.  For individual provider: See Shea Evans

## 2017-03-18 NOTE — Evaluation (Signed)
Clinical/Bedside Swallow Evaluation Patient Details  Name: Deanna Baldwin MRN: [id number] Date of Birth: 17-Jan-1976  Today's Date: 03/18/2017 Time: SLP Start Time (ACUTE ONLY): 0910 SLP Stop Time (ACUTE ONLY): 0940 SLP Time Calculation (min) (ACUTE ONLY): 30 min  Past Medical History:  Past Medical History:  Diagnosis Date  . Buttock wound 03/03/2016  . Dysrhythmia    tachycardia  . MS (multiple sclerosis) ([place])   . Neutropenia ([place])   . Pneumonia 02/2016  . Protein calorie malnutrition ([place])   . Severe sepsis ([place]) 03/03/2016  . 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 UTI (urinary tract infection) 02/2016   Past Surgical History:  Past Surgical History:  Procedure Laterality Date  . DIVERTING ILEOSTOMY N/A 09/03/2016   Procedure: ILEAL CONDUIT  URINARY DIVERSION OPEN;  Surgeon: Ardis Hughs, MD;  Location: [facility];  Service: Urology;  Laterality: N/A;   HPI:  42 year old female admitted 03/17/17 with abdominal pain and AMS. 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 Trials of thin liquid, puree, and solid were tolerated without overt s/s aspiration, however, pt MBS indicated silent aspiration of thin and nectar thick liquids last February. Pt indicated she was eating regular foods and drinking thin liquids at SNF, and [facility] confirmed this. Will begin Dys 2 diet and thin liquids and advance solids as pt tolerates. ST will continue to follow for diet tolerance. advancement, and education. RN and MD aware.    SLP Visit Diagnosis: Dysphagia, unspecified (R13.10)    Aspiration Risk  Mild aspiration risk;Moderate aspiration risk    Diet Recommendation Dysphagia 2 (Fine chop);Thin liquid   Liquid Administration via: Straw;Cup Medication Administration: Whole meds with liquid Supervision: Patient able to self  feed;Intermittent supervision to cue for compensatory strategies;Staff to assist with self feeding Compensations: Minimize environmental distractions;Slow rate;Small sips/bites Postural Changes: Seated upright at 90 degrees;Remain upright for at least 30 minutes after po intake    Other  Recommendations Oral Care Recommendations: Oral care QID   Follow up Recommendations 24 hour supervision/assistance      Frequency and Duration min 1 x/week  1 week;2 weeks       Prognosis Prognosis for Safe Diet Advancement: Fair Barriers to Reach Goals: Cognitive deficits;Time post onset      Swallow Study   General Date of Onset: 03/17/17 HPI: 42 year old female admitted 03/17/17 with abdominal pain and AMS. 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 Diet Prior to this Study: NPO Temperature Spikes Noted: No Respiratory Status: Room air History of Recent Intubation: No Behavior/Cognition: Alert;Cooperative;Pleasant mood;Confused Oral Cavity Assessment: Dry Oral Care Completed by SLP: No Oral Cavity - Dentition: Missing dentition Vision: Functional for self-feeding Self-Feeding Abilities: Able to feed self;Needs assist Patient Positioning: Upright in bed Baseline Vocal Quality: Normal Volitional Cough: Weak Volitional Swallow: Able to elicit    Oral/Motor/Sensory Function Overall Oral Motor/Sensory Function: (generalized weakness)   Ice Chips Ice chips: Not tested   Thin Liquid Thin Liquid: Within functional limits Presentation: Straw    Nectar Thick Nectar Thick Liquid: Not tested   Honey Thick Honey Thick Liquid: Not tested   Puree Puree: Within functional limits Presentation: Self Fed;Spoon   Solid   GO   Solid: Within  functional limits Presentation: [place] Quentin Ore, [facility], [place] Speech Language  Pathologist [phone]  Shonna Chock 03/18/2017,12:28 PM

## 2017-03-18 NOTE — NC FL2 (Signed)
MEDICAID FL2 LEVEL OF CARE SCREENING TOOL     IDENTIFICATION  Patient Name: Deanna Baldwin Birthdate: 1975-03-01 Sex: female Admission Date (Current Location): 03/17/2017  [facility] and [place] Number:  Herbalist and Address:  [facility]. [facility], [place] [address] [place], [place] [address]      Provider Number: [id number]  Attending Physician Name and Address:  Dessa Phi, DO  Relative Name and Phone Number:  Westley Foots, [phone]    Current Level of Care: Hospital Recommended Level of Care: [place] Prior Approval Number:    Date Approved/Denied:   PASRR Number:    Discharge Plan: SNF    Current Diagnoses: Patient Active Problem List   Diagnosis Date Noted  . Sepsis ([place]) 03/17/2017  . Paraplegia ([place]) 03/09/2017  . Chronic constipation 02/13/2017  . Elevated liver enzymes 02/13/2017  . Chronic pain 12/13/2016  . Leukocytosis 09/15/2016  . Hypotension 08/31/2016  . Vitamin B 12 deficiency 07/09/2016  . Pressure ulcer, stage 4 ([place]) 07/08/2016  . Unintentional weight loss 07/06/2016  . Relapsing remitting multiple sclerosis ([place]) 06/08/2016  . Neurogenic bladder 04/09/2016  . Tachycardia 04/08/2016  . Anemia of chronic disease 04/06/2016  . Dysphagia, oral phase 03/17/2016  . Protein-calorie malnutrition, severe ([place])   . 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 UTI (urinary tract infection) 03/02/2016    Orientation RESPIRATION BLADDER Height & Weight     Self, Place  Normal Continent, Urostomy Weight: 39 kg (86 lb) Height:  5\' 9"  (175.3 cm)  BEHAVIORAL SYMPTOMS/MOOD NEUROLOGICAL BOWEL NUTRITION STATUS      Continent Diet(Please see DC Summary)  AMBULATORY STATUS COMMUNICATION OF NEEDS Skin   Extensive Assist Verbally PU Stage and Appropriate Care(Stage IV on sacral; Stage III; deep pressure wound on hip)                       Personal Care Assistance Level of  Assistance  Bathing, Feeding, Dressing Bathing Assistance: Maximum assistance Feeding assistance: Limited assistance Dressing Assistance: Limited assistance     Functional Limitations Info             SPECIAL CARE FACTORS FREQUENCY  Speech therapy             Speech Therapy Frequency: 2x/week      Contractures      Additional Factors Info  Code Status, Allergies, Isolation Precautions Code Status Info: Full Allergies Info: NKA     Isolation Precautions Info: MRSA in the nose     Current Medications (03/18/2017):  This is the current hospital active medication list Current Facility-Administered Medications  Medication Dose Route Frequency Provider Last Rate Last Dose  . acetaminophen (TYLENOL) tablet 1,000 mg  1,000 mg Oral TID Brenton Grills, PA-C   1,000 mg at 03/18/17 0854  . baclofen (LIORESAL) tablet 20 mg  20 mg Oral QID Brenton Grills, PA-C   20 mg at 03/18/17 0854  . bisacodyl (DULCOLAX) EC tablet 10 mg  10 mg Oral Daily Brenton Grills, PA-C   10 mg at 03/18/17 0854  . Chlorhexidine Gluconate Cloth 2 % PADS 6 each  6 each Topical Q0600 Karmen Bongo, MD   6 each at 03/18/17 0559  . collagenase (SANTYL) ointment   Topical Daily Karmen Bongo, MD      . dextrose 5 %-0.45 % sodium chloride infusion   Intravenous Continuous Dessa Phi, DO 100 mL/hr at 03/18/17 2202    .  docusate sodium (COLACE) capsule 100 mg  100 mg Oral BID Brenton Grills, PA-C   100 mg at 03/18/17 0855  . famotidine (PEPCID) tablet 20 mg  20 mg Oral [address], [place]   20 mg at 03/18/17 0601  . feeding supplement (PRO-STAT SUGAR FREE 64) liquid 30 mL  30 mL Oral BID Brenton Grills, PA-C   30 mL at 03/18/17 0854  . ibuprofen (ADVIL,MOTRIN) tablet 200 mg  200 mg Oral TID Brenton Grills, PA-C   200 mg at 03/18/17 0854  . midodrine (PROAMATINE) tablet 2.5 mg  2.5 mg Oral TID WC York Grice S, PA-C   2.5 mg at 03/18/17 1255  . morphine 4 MG/ML  injection 2 mg  2 mg Intravenous Q3H PRN York Grice S, PA-C      . mupirocin ointment (BACTROBAN) 2 % 1 application  1 application Nasal BID Karmen Bongo, MD   1 application at 54/65/03 [phone]  . ondansetron (ZOFRAN) tablet 4 mg  4 mg Oral Q6H PRN York Grice S, PA-C       Or  . ondansetron (ZOFRAN) injection 4 mg  4 mg Intravenous Q6H PRN York Grice S, PA-C      . polyethylene glycol (MIRALAX / GLYCOLAX) packet 17 g  17 g Oral BID Brenton Grills, PA-C   17 g at 03/18/17 0854  . white petrolatum (VASELINE) gel              Discharge Medications: Please see discharge summary for a list of discharge medications.  Relevant Imaging Results:  Relevant Lab Results:   Additional Information SSN: [id number]  Benard Halsted, LCSWA

## 2017-03-18 NOTE — Consult Note (Signed)
Date of Admission:  03/17/2017          Reason for Consult: Osteomyelitis     Referring Provider: Dr. Maylene Roes    Assessment: Deanna Baldwin is a 42 y.o. 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 She was initially started on Vanc and Zosyn, which have since been held. Appreciate surgery consult. Would appreciate consideration of bone biopsy to help US guide antibiotic therapy; otherwise, the patient will require multiple antibiotics for extended period of time (6 weeks).   Plan: 1. Follow blood cultures  2. If surgery does not do bone biopsy or obtain surgical cultures, would recommend treating with Vancomycin, Ceftriaxone, and Metronidazole for 6 weeks   Principal Problem:   Sepsis ([place]) Active Problems:   Protein-calorie malnutrition, severe ([place])   Dysphagia, oral phase   Anemia of chronic disease   Neurogenic bladder   Pressure ulcer, stage 4 (HCC)   Chronic pain   Chronic constipation   Paraplegia (H[facility])  Scheduled Meds: . acetaminophen  1,000 mg Oral TID  . baclofen  20 mg Oral QID  . bisacodyl  10 mg Oral Daily  . Chlorhexidine Gluconate Cloth  6 each Topical Q0600  . collagenase   Topical Daily  . docusate sodium  100 mg Oral BID  . famotidine  20 mg Oral [id number]  . feeding supplement (PRO-STAT SUGAR FREE 64)  30 mL Oral BID  . ibuprofen  200 mg Oral TID  . midodrine  2.5 mg Oral TID WC  . mupirocin ointment  1 application Nasal BID  . polyethylene glycol  17 g Oral BID   Continuous Infusions: . dextrose 5 % and 0.45% NaCl 100 mL/hr at 03/18/17 0812   PRN Meds:.morphine injection, ondansetron **OR** ondansetron (ZOFRAN) IV  HPI: Deanna Baldwin is a 42 y.o. 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 At baseline the patient normally able is to converse. On arrival she was afebrile but tachycardic on admission. She had several lab abnormalities including leukocytosis, anemia, transaminitis, elevated lactic acid, and signs of dehydration. CXR and RUQ ultrasound negative   Unfortunately only 1 of the 2 blood cultures were obtained prior to Abx administration due to the patient being a difficult stick. Wound care evaluated the patient, found to have a stage 4 sacral pressure ulcer with palpable bone. Also have right ischium, left ischium, and left proximal ischial ulcers. Does have diverting ostomy. 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   Today the patient is alert and oriented. She states that the illness started abruptly and days prior to admission she was feeling fine. She otherwise denies systemic symptoms and 12 point ROS in negative.   Abx:  Vanc 2/14 -> 2/15 Zosyn 2/14 -> 2/15  Cultures: Blood cultures 2/14  - 1 culture prior to antibiotics, 1 post antibiotic   Review of Systems: Review of Systems  All other systems reviewed and are negative.  Past Medical History:  Diagnosis Date  . Buttock wound 03/03/2016  . Dysrhythmia    tachycardia  . MS (multiple sclerosis) ([place])   . Neutropenia ([place])   . Pneumonia 02/2016  . Protein calorie malnutrition ([place])   .  Severe sepsis ([place]) 03/03/2016  . 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 UTI (urinary tract infection) 02/2016   Social History   Tobacco Use  . Smoking status: Former Smoker    Packs/day: 1.00    Years: 10.00    Pack years: 10.00    Types: Cigarettes    Last attempt to quit: 02/01/2010    Years since quitting: 7.1  . Smokeless tobacco: Never Used  Substance Use Topics  . Alcohol use: No  . Drug use: No   Family History  Problem Relation Age of Onset  . Mental  illness Sister    No Known Allergies  OBJECTIVE: Blood pressure (!) 83/56, pulse 86, temperature 97.8 F (36.6 C), temperature source Oral, resp. rate 16, height 5\' 9"  (1.753 m), weight 86 lb (39 kg), last menstrual period 01/28/2016, SpO2 100 %.  Physical Exam  Constitutional: She is oriented to person, place, and time.  Thin female in no acute distress  HENT:  Head: Normocephalic and atraumatic.  Cardiovascular: Normal rate, regular rhythm, normal heart sounds and intact distal pulses.  Pulmonary/Chest: Effort normal. She has no wheezes.  Abdominal: Soft. Bowel sounds are normal. There is no tenderness.  Ostomy site without erythema or discharge  Musculoskeletal: She exhibits no edema.  LE contractures  Neurological: She is alert and oriented to person, place, and time.  Skin: Skin is warm and dry.  4 ulcers on the bottom. Bandages in place. Clean and dry   Lab Results Lab Results  Component Value Date   WBC 7.5 03/18/2017   HGB 8.8 (L) 03/18/2017   HCT 27.7 (L) 03/18/2017   MCV 86.6 03/18/2017   PLT 478 (H) 03/18/2017    Lab Results  Component Value Date   CREATININE 0.66 03/18/2017   BUN 30 (H) 03/18/2017   NA 152 (H) 03/18/2017   K 4.1 03/18/2017   CL 126 (H) 03/18/2017   CO2 11 (L) 03/18/2017    Lab Results  Component Value Date   ALT 123 (H) 03/18/2017   AST 33 03/18/2017   ALKPHOS 930 (H) 03/18/2017   BILITOT 0.8 03/18/2017    Microbiology: Recent Results (from the past 240 hour(s))  Urine culture     Status: Abnormal   Collection Time: 03/17/17  5:40 AM  Result Value Ref Range Status   Specimen Description URINE, RANDOM  Final   Special Requests   Final    NONE Performed at [facility], [address] [place], [place] [address]    Culture MULTIPLE SPECIES PRESENT, SUGGEST RECOLLECTION (A)  Final   Report Status 03/18/2017 FINAL  Final  MRSA PCR Screening     Status: Abnormal   Collection Time: 03/17/17  6:36 PM  Result Value Ref Range  Status   MRSA by PCR POSITIVE (A) NEGATIVE Final    Comment:        The GeneXpert MRSA Assay (FDA approved for NASAL specimens only), is one component of a comprehensive MRSA colonization surveillance program. It is not intended to diagnose MRSA infection nor to guide or monitor treatment for MRSA infections. RESULT CALLED TO, READ BACK BY AND VERIFIED WITH: A..POTEAT,RN AT 2255 BY L.PITT 03/17/17    Ina Homes, [place] [facility] [phone] pager  03/18/2017, 11:21 AM

## 2017-03-18 NOTE — Care Management Note (Signed)
Case Management Note  Patient Details  Name: Deanna Baldwin MRN: [id number] Date of Birth: 1975/09/19  Subjective/Objective:   Sepsis                Action/Plan: Patient is a resident of [facility]; SW consult placed to transition back to facility when medically stable; CM following for progression of care.  Expected Discharge Date:    possibly 03/22/2017              Expected Discharge Plan:  Skilled Nursing Facility  In-House Referral:  Clinical Social Work  Discharge planning Services  CM Consult    Status of Service:  In process, will continue to follow  Sherrilyn Rist [phone] 03/18/2017, 10:50 AM

## 2017-03-18 NOTE — Progress Notes (Signed)
PROGRESS NOTE    Deanna Baldwin  KDT:[id number] DOB: 11/16/1975 DOA: 03/17/2017 PCP: [name], DO     Brief Narrative:  Deanna Baldwin is a 42 y.o. 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 In the ED, she was noted to be confused and groaning. She was admitted for sepsis, unclear etiology, started on vanco/zosyn. Wound RN recommended MR pelvis to evaluate her chronic wounds which revealed osteomyelitis.   Assessment & Plan:   Principal Problem:   Sepsis ([place]) Active Problems:   Protein-calorie malnutrition, severe (HCC)   Dysphagia, oral phase   Anemia of chronic disease   Neurogenic bladder   Pressure ulcer, stage 4 (HCC)   Chronic pain   Chronic constipation   Paraplegia (HCC)  Acute metabolic encephalopathy -Improving. Alert and oriented x 2 on my exam and able to answer most questions.   Sepsis secondary to sacral and left ischial osteomyelitis -MR pelvis: sacral decubitus ulcer with underlying osteomyelitis, deep decubitus ulcer over the left ischial tuberosity with osteomyelitis. Diffuse myositis without definite findings for pyomyositis or discrete drainable soft tissue abscess -General surgery consulted -ID consulted -Blood cultures pending  -Vanco/zosyn started in ED. Hold for now, will need wound cultures to aid in antibiotic therapy   Hypernatremia -With poor PO intake -IVF D5 0.45NS  -Trend BMP   Elevated liver enzymes -RUQ [place]: No acute or focal abnormality identified. No gallstones or biliary distention. Liver appears normal. -Hepatitis panel negative  -Improving  Anemia of chronic disease -FOBT positive on admission, has hx of hemorrhoids. Transfused 1u pRBC on admission  -Watch for gross GI bleeding -Transfuse if hgb <  7  Multiple sclerosis with spatic paraplegia, neurogenic bladder -Supportive care  -Urostomy care  -Continue baclofen  Chronic hypotension -Continue midodrine   Chronic constipation -Bowel regimen  Goals of care -Palliative care consulted  DVT prophylaxis: SCD Code Status: Full Family Communication: Left VM for sister, spoke with aunt over the phone. Per aunt, sister is HCPOA Disposition Plan: Pending improvement, back to SNF when stable   Consultants:   General Surgery  ID   Procedures:   None  Antimicrobials:  Anti-infectives (From admission, onward)   Start     Dose/Rate Route Frequency Ordered Stop   03/18/17 0600  vancomycin (VANCOCIN) IVPB 750 mg/150 ml premix  Status:  Discontinued     750 mg 150 mL/hr over 60 Minutes Intravenous Every 24 hours 03/17/17 0753 03/18/17 1118   03/17/17 1400  piperacillin-tazobactam (ZOSYN) IVPB 3.375 g  Status:  Discontinued     3.375 g 12.5 mL/hr over 240 Minutes Intravenous Every 8 hours 03/17/17 0753 03/18/17 1118   03/17/17 0615  piperacillin-tazobactam (ZOSYN) IVPB 3.375 g     3.375 g 100 mL/hr over 30 Minutes Intravenous  Once 03/17/17 0609 03/17/17 0740   03/17/17 0615  vancomycin (VANCOCIN) IVPB 1000 mg/200 mL premix     1,000 mg 200 mL/hr over 60 Minutes Intravenous  Once 03/17/17 9983 03/17/17 0801       Subjective: No complaints, she is alert, verbal, able to tell me that she is at [facility] but not oriented to the year. Able to answer questions appropriately and follow directions.   Objective: Vitals:   03/17/17 1700 03/17/17 1700 03/17/17 2310 03/18/17 0502  BP: 104/62 104/62 106/68 (!) 83/56  Pulse:  96 (!)  103 86  Resp: 18  18 16   Temp: 98.5 F (36.9 C) 98.5 F (36.9 C) 98.1 F (36.7 C) 97.8 F (36.6 C)  TempSrc: Oral Oral Oral Oral  SpO2: 100% 100% 100% 100%  Weight:  40 kg (88 lb 3.2 oz)  39 kg (86 lb)  Height:  5\' 9"  (1.753 m)      Intake/Output Summary (Last 24 hours) at 03/18/2017  1333 Last data filed at 03/18/2017 1224 Gross per 24 hour  Intake 266.67 ml  Output 1300 ml  Net -1033.33 ml   Filed Weights   03/17/17 1700 03/18/17 0502  Weight: 40 kg (88 lb 3.2 oz) 39 kg (86 lb)    Examination:  General exam: Appears calm and comfortable, frail and thin, chronically ill appearing  Respiratory system: Clear to auscultation. Respiratory effort normal. Cardiovascular system: S1 & S2 heard, RRR. No JVD, murmurs, rubs, gallops or clicks. No pedal edema. Gastrointestinal system: Abdomen is nondistended, soft and nontender. No organomegaly or masses felt. Normal bowel sounds heard. +urostomy in place  Central nervous system: Alert and oriented x2 Extremities: +contractures  Skin: +stage IV sacral decub with palpable bone, +bilateral stage 2-3 decub of ischial tuberosities  Psychiatry: Judgement and insight appear normal  Data Reviewed: I have personally reviewed following labs and imaging studies  CBC: Recent Labs  Lab 03/17/17 0557 03/18/17 0541  WBC 12.6* 7.5  NEUTROABS 10.7*  --   HGB 6.8* 8.8*  HCT 21.4* 27.7*  MCV 86.3 86.6  PLT 702* 315*   Basic Metabolic Panel: Recent Labs  Lab 03/17/17 0557 03/18/17 0541  NA 148* 152*  K 4.5 4.1  CL 118* 126*  CO2 13* 11*  GLUCOSE 129* 102*  BUN 50* 30*  CREATININE 0.82 0.66  CALCIUM 8.5* 8.0*   GFR: Estimated Creatinine Clearance: 57.6 mL/min (by C-G formula based on SCr of 0.66 mg/dL). Liver Function Tests: Recent Labs  Lab 03/17/17 0557 03/18/17 0742  AST 62* 33  ALT 220* 123*  ALKPHOS 1,231* 930*  BILITOT 0.5 0.8  PROT 7.8 6.1*  ALBUMIN 3.4* 2.5*   Recent Labs  Lab 03/17/17 0918  LIPASE 128*   No results for input(s): AMMONIA in the last 168 hours. Coagulation Profile: Recent Labs  Lab 03/17/17 0918  INR 1.28   Cardiac Enzymes: No results for input(s): CKTOTAL, CKMB, CKMBINDEX, TROPONINI in the last 168 hours. BNP (last 3 results) No results for input(s): PROBNP in the last 8760  hours. HbA1C: No results for input(s): HGBA1C in the last 72 hours. CBG: No results for input(s): GLUCAP in the last 168 hours. Lipid Profile: No results for input(s): CHOL, HDL, LDLCALC, TRIG, CHOLHDL, LDLDIRECT in the last 72 hours. Thyroid Function Tests: No results for input(s): TSH, T4TOTAL, FREET4, T3FREE, THYROIDAB in the last 72 hours. Anemia Panel: No results for input(s): VITAMINB12, FOLATE, FERRITIN, TIBC, IRON, RETICCTPCT in the last 72 hours. Sepsis Labs: Recent Labs  Lab 03/17/17 0609 03/17/17 0918 03/17/17 0933 03/18/17 0742  LATICACIDVEN 2.05* 2.4* 2.24* 0.6    Recent Results (from the past 240 hour(s))  Urine culture     Status: Abnormal   Collection Time: 03/17/17  5:40 AM  Result Value Ref Range Status   Specimen Description URINE, RANDOM  Final   Special Requests   Final    NONE Performed at [facility], [place] [address] [place], [place] [address]    Culture MULTIPLE SPECIES PRESENT, SUGGEST RECOLLECTION (A)  Final   Report Status 03/18/2017 FINAL  Final  MRSA PCR Screening     Status: Abnormal   Collection Time: 03/17/17  6:36 PM  Result Value Ref Range Status   MRSA by PCR POSITIVE (A) NEGATIVE Final    Comment:        The GeneXpert MRSA Assay (FDA approved for NASAL specimens only), is one component of a comprehensive MRSA colonization surveillance program. It is not intended to diagnose MRSA infection nor to guide or monitor treatment for MRSA infections. RESULT CALLED TO, READ BACK BY AND VERIFIED WITH: A..POTEAT,RN AT 2255 BY L.PITT 03/17/17        Radiology Studies: Mr Pelvis Wo Contrast  Result Date: 03/17/2017 CLINICAL DATA:  Large decubitus ulcers EXAM: MRI PELVIS WITHOUT CONTRAST TECHNIQUE: Multiplanar multisequence MR imaging of the pelvis was performed. No intravenous contrast was administered. COMPARISON:  MRI 03/07/2016 FINDINGS: There is a sacral decubitus ulcer extending right down to the bone with underlying T2 signal  abnormality in the S3 segment of the sacrum consistent with osteomyelitis. Decubitus ulcer over the left ischial tuberosity extending down to the bone. Abnormal T2 signal intensity in the bone suggesting osteomyelitis. No definite findings for septic arthritis or osteomyelitis involving the hips. 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 Edema like signal abnormality in the adductor muscles bilaterally, left greater than right suggesting myositis. Similar signal abnormality in the left thigh and hip muscles. No definite findings for pyomyositis. No significant intrapelvic abnormalities. No intrapelvic abscess is identified. The SI joints are intact. IMPRESSION: 1. Deep sacral decubitus ulcer with underlying osteomyelitis. 2. Deep decubitus ulcer over the left ischial tuberosity with osteomyelitis. 3. Diffuse myositis without definite findings for pyomyositis or discrete drainable soft tissue abscess. 4. No definite findings for septic arthritis. 5. 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 6. No definite intrapelvic abscess. Electronically Signed   By: Marijo Sanes M.D.   On: 03/17/2017 21:06   Dg Chest Port 1 View  Result Date: 03/17/2017 CLINICAL DATA:  Sepsis. EXAM: PORTABLE CHEST 1 VIEW COMPARISON:  03/17/2017 FINDINGS: Cardiomediastinal silhouette is normal. Mediastinal contours appear intact. There is no evidence of pneumothorax. Stable hyperinflation of the lungs. Stable left hemidiaphragmatic juxta peaking. Bilateral apical pleural thickening. Osseous structures are without acute abnormality. Soft tissues are grossly normal. IMPRESSION: Stable hyperinflation of the lungs. Stable left hemidiaphragmatic juxta peaking which may be seen with scarring or sub pulmonic effusion. Electronically Signed   By: Fidela Salisbury M.D.   On: 03/17/2017 09:25   Dg Chest Port 1 View  Result Date: 03/17/2017 CLINICAL DATA:  Altered mental status, abdominal pain beginning last  night. History of multiple sclerosis. EXAM: PORTABLE CHEST 1 VIEW COMPARISON:  Chest radiograph August 31, 2016 FINDINGS: Cardiomediastinal silhouette is normal. Elevated LEFT hemidiaphragm with juxta peaking. Hyperinflation. Apical pleural thickening. No pneumothorax. Osteopenia. Cachexia. IMPRESSION: LEFT lung base juxtapeaking seen with scarring or subpulmonic effusion. Stable hyperinflation. Electronically Signed   By: Elon Alas M.D.   On: 03/17/2017 06:27   US Abdomen Limited Ruq  Result Date: 03/17/2017 CLINICAL DATA:  Elevated LFTs. EXAM: ULTRASOUND ABDOMEN LIMITED RIGHT UPPER QUADRANT COMPARISON:  09/06/2016. FINDINGS: Gallbladder: No gallstones or wall thickening visualized. No sonographic Murphy sign noted by sonographer. Common bile duct: Diameter: 1.1 mm Liver: No focal lesion identified. Within normal limits in parenchymal echogenicity. Portal vein is patent on color Doppler imaging with normal direction of blood flow towards the liver. IMPRESSION: No acute or focal abnormality identified. No gallstones or biliary distention. Liver appears normal. Electronically Signed  By: [place]   On: 03/17/2017 10:06      Scheduled Meds: . acetaminophen  1,000 mg Oral TID  . baclofen  20 mg Oral QID  . bisacodyl  10 mg Oral Daily  . Chlorhexidine Gluconate Cloth  6 each Topical Q0600  . collagenase   Topical Daily  . docusate sodium  100 mg Oral BID  . famotidine  20 mg Oral [id number]  . feeding supplement (PRO-STAT SUGAR FREE 64)  30 mL Oral BID  . ibuprofen  200 mg Oral TID  . midodrine  2.5 mg Oral TID WC  . mupirocin ointment  1 application Nasal BID  . polyethylene glycol  17 g Oral BID  . white petrolatum       Continuous Infusions: . dextrose 5 % and 0.45% NaCl 100 mL/hr at 03/18/17 0812     LOS: 1 day    Time spent: 40 minutes   Dessa Phi, DO [facility] [email or web address] Password [email or web address] 03/18/2017, 1:33 PM

## 2017-03-18 NOTE — Progress Notes (Signed)
Initial Nutrition Assessment  DOCUMENTATION CODES:   Severe malnutrition in context of chronic illness  INTERVENTION:  Ensure Enlive po TID, each supplement provides 350 kcal and 20 grams of protein  Prostat po BID, each supplement provides 100 kcal and 15 grams of protein  NUTRITION DIAGNOSIS:   Severe Malnutrition related to chronic illness as evidenced by severe fat depletion, severe muscle depletion.   GOAL:   Patient will meet greater than or equal to 90% of their needs   MONITOR:   PO intake, Supplement acceptance, Diet advancement, Labs, Skin  REASON FOR ASSESSMENT:   Consult Malnutrition Eval(per md request: cachexia)  ASSESSMENT:   42 yo female admitted from SNF on 2/14 with sepsis, dehydration, anemia of chronic disease, stage IV pressure injuries. 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  S/w RN who reports pt is able to feed herself.  Pt was receiving wound care at the time of the nutrition assessment visit. Pt was able to communicate, but limited to simple responses due to cognitive deficits. Limited dietary intake and wt hx. Pt states that she normally has a good appetite and is currently hungry (diet has been NPO and pt has not eaten yet). Pt was not able to say if she has had supplemental nutrition before, but said she would make an effort to take supplemental nutrition b/c she wants to heal.  Per medical record, pt wt has decreased by 10% in past 2 months:  Wt Readings from Last 10 Encounters:  03/18/17 86 lb (39 kg)  01/27/17 97 lb 9.6 oz (44.3 kg)  12/30/16 94 lb 9.6 oz (42.9 kg)  11/23/16 86 lb 6.4 oz (39.2 kg)  11/01/16 81 lb 8 oz (37 kg)  10/27/16 85 lb (38.6 kg)  09/16/16 86 lb 8 oz (39.2 kg)  09/15/16 80 lb 8 oz (36.5 kg)  09/14/16 80 lb 8 oz (36.5 kg)  09/13/16 86 lb 8 oz (39.2 kg)     Medications include:  Dulcolax Baclofen Colace pepcid miralax   Labs reviewed:  Alkaline  phos 930 (H) ALT 123 (H) HGB 8.8 (L) HCT 27.7 (L) Lactic acid 2.24 (HH) MRSA positive   NUTRITION - FOCUSED PHYSICAL EXAM:  Pt does not have use of lower extremities. Has use of upper extremities and has been able to feed herself.    Most Recent Value  Orbital Region  Unable to assess [positioned face on pillow for wound care]  Upper Arm Region  Severe depletion  Thoracic and Lumbar Region  Severe depletion  Buccal Region  Unable to assess [positioned face on pillow for wound care]  Temple Region  Unable to assess [positioned face on pillow for wound care ]  Clavicle Bone Region  Unable to assess  Clavicle and Acromion Bone Region  Unable to assess  Scapular Bone Region  Severe depletion  Dorsal Hand  Moderate depletion  Patellar Region  Severe depletion  Anterior Thigh Region  Severe depletion  Posterior Calf Region  Severe depletion  Edema (RD Assessment)  None  Hair  Reviewed  Eyes  Unable to assess  Mouth  Unable to assess  Skin  Reviewed  Nails  Reviewed       Diet Order:  DIET DYS 2 Room service appropriate? Yes; Fluid consistency: Thin  EDUCATION NEEDS:   No education needs have been identified at this time  Skin:  Skin Assessment: Skin Integrity Issues: Skin Integrity Issues:: Stage IV, Stage III, Stage II, DTI DTI:  lt ischium Stage II: lt ischium Stage III: rt ischium Stage IV: sacrum  Last BM:  none noted since admission; pt has ileoostomy  Height:   Ht Readings from Last 1 Encounters:  03/17/17 5\' 9"  (1.753 m)    Weight:   Wt Readings from Last 1 Encounters:  03/18/17 86 lb (39 kg)    Ideal Body Weight:  65.8 kg  BMI:  Body mass index is 12.7 kg/m.  Estimated Nutritional Needs:   Kcal:  1,500-1,700 kcal  Protein:  75-85 grams  Fluid:  >1.5 Carlean Jews    Edmonia Lynch, MS Dietetic Intern Pager: [phone]

## 2017-03-18 NOTE — Consult Note (Signed)
[facility] Consult/Admission Note  Deanna Baldwin 1975-06-05  [id number].    Requesting MD: Dr. Maylene Roes Chief Complaint/Reason for Consult: sacral decub  HPI:  Deanna Baldwin is a 42 y.o. female with medical history significant of MS with spastic paraplegia, neurogenic bladder -s/p urostomy with frequent UTI and urosepsis, decubitus ulcer, anemia, chronic constipation, hemorrhoids who was brought to the ED with AMS, tachycardia and fever. Pt states she has had the wounds on her bottom for some time. She states they change the dressings daily where she lives. She sits in her wheelchair often and has a cushion for it. Pt denies pain in these areas. Pt is being worked up for sepsis. We were consulted for a sacral decub with + osteo on MRI.   ROS:  Review of Systems  Constitutional: Negative for chills, diaphoresis and fever.  HENT: Negative for sore throat.   Respiratory: Negative for cough and shortness of breath.   Cardiovascular: Negative for chest pain.  Gastrointestinal: Negative for abdominal pain, blood in stool, constipation, diarrhea, nausea and vomiting.  Genitourinary: Negative for dysuria.       + Sacral and b/l ischial ulcers  Skin: Negative for rash.  Neurological: Negative for dizziness and loss of consciousness.  All other systems reviewed and are negative.    Family History  Problem Relation Age of Onset  . Mental illness Sister     Past Medical History:  Diagnosis Date  . Buttock wound 03/03/2016  . Dysrhythmia    tachycardia  . MS (multiple sclerosis) ([place])   . Neutropenia ([place])   . Pneumonia 02/2016  . Protein calorie malnutrition ([place])   . Severe sepsis ([place]) 03/03/2016  . 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 UTI (urinary tract infection) 02/2016    Past Surgical History:  Procedure Laterality Date  . DIVERTING ILEOSTOMY N/A 09/03/2016   Procedure: ILEAL CONDUIT  URINARY DIVERSION OPEN;  Surgeon: Ardis Hughs,  MD;  Location: [facility];  Service: Urology;  Laterality: N/A;    Social History:  reports that she quit smoking about 7 years ago. Her smoking use included cigarettes. She has a 10.00 pack-year smoking history. she has never used smokeless tobacco. She reports that she does not drink alcohol or use drugs.  Allergies: No Known Allergies  Medications Prior to Admission  Medication Sig Dispense Refill  . acetaminophen (TYLENOL) 500 MG tablet Take 1,000 mg by mouth 3 (three) times daily.     . Amino Acids-Protein Hydrolys (FEEDING SUPPLEMENT, PRO-STAT SUGAR FREE 64,) LIQD Take 30 mLs by mouth 2 (two) times daily. 900 mL 0  . baclofen (LIORESAL) 20 MG tablet Take 20 mg by mouth 4 (four) times daily.    . bisacodyl (DULCOLAX) 10 MG suppository Place 1 suppository (10 mg total) rectally daily as needed for mild constipation or moderate constipation. 12 suppository 0  . bisacodyl (DULCOLAX) 5 MG EC tablet Take 5 mg by mouth daily.    . Cyanocobalamin (VITAMIN B-12 PO) Take 1 tablet by mouth daily.    . diclofenac sodium (VOLTAREN) 1 % GEL Apply 2 g topically 3 (three) times daily. Apply to Left shoulder    . famotidine (PEPCID) 20 MG tablet Take 20 mg by mouth every morning.    Marland Kitchen ibuprofen (ADVIL,MOTRIN) 200 MG tablet Take 200 mg by mouth 3 (three) times daily.    . midodrine (PROAMATINE) 2.5 MG tablet Take 1 tablet (2.5 mg total) by mouth 3 (three) times daily with meals.    Marland Kitchen  Multiple Vitamins-Minerals (DECUBI-VITE) CAPS Take 1 capsule by mouth daily.    . ondansetron (ZOFRAN) 4 MG tablet Take 4 mg by mouth every 6 (six) hours as needed for nausea or vomiting.    . polyethylene glycol (MIRALAX / GLYCOLAX) packet Take 17 g by mouth daily.    . vitamin C (ASCORBIC ACID) 500 MG tablet Take 500 mg by mouth 2 (two) times daily.    . Vitamin D, Ergocalciferol, (DRISDOL) 50000 units CAPS capsule Take 50,000 Units by mouth every 30 (thirty) days.      Blood pressure (!) 83/56, pulse 86, temperature 97.8 F  (36.6 C), temperature source Oral, resp. rate 16, height _0  (1.753 m), weight 86 lb (39 kg), last menstrual period 01/28/2016, SpO2 100 %.  Physical Exam  Constitutional: She is oriented to person, place, and time. Vital signs are normal. No distress.  Thin, cachetic pleasant female   HENT:  Head: Normocephalic and atraumatic.  Nose: Nose normal.  Mouth/Throat: Mucous membranes are normal.  Lips are dry and flaky  Eyes: Conjunctivae are normal. Right eye exhibits no discharge. Left eye exhibits no discharge. No scleral icterus.  Pupils were equal and round  Neck: Normal range of motion. Neck supple.  Cardiovascular: Normal rate, regular rhythm and normal heart sounds. Exam reveals no gallop and no friction rub.  No murmur heard. Pulmonary/Chest: Effort normal and breath sounds normal. No respiratory distress. She has no decreased breath sounds. She has no wheezes. She has no rhonchi. She has no rales.  Abdominal: Soft. Bowel sounds are normal. She exhibits no distension and no mass. There is no tenderness. There is no rebound and no guarding.  Urostomy to right lower abdomen  Genitourinary:  Genitourinary Comments: Sacral decub and b/l ischial ulcers, see photo below. Pt is non tender of these areas  Musculoskeletal: She exhibits deformity. She exhibits no edema or tenderness.  Contractures of BLE with little to no ROM, severe atrophy   Neurological: She is alert and oriented to person, place, and time.  Skin: Skin is warm and dry. No rash noted. She is not diaphoretic.  Nursing note and vitals reviewed.      Results for orders placed or performed during the hospital encounter of 03/17/17 (from the past 48 hour(s))  Urinalysis, Routine w reflex microscopic     Status: Abnormal   Collection Time: 03/17/17  5:37 AM  Result Value Ref Range   Color, Urine AMBER (A) YELLOW    Comment: BIOCHEMICALS MAY BE AFFECTED BY COLOR   APPearance CLOUDY (A) CLEAR   Specific Gravity, Urine  1.012 1.005 - 1.030   pH 7.0 5.0 - 8.0   Glucose, UA 50 (A) NEGATIVE mg/dL   Hgb urine dipstick SMALL (A) NEGATIVE   Bilirubin Urine NEGATIVE NEGATIVE   Ketones, ur 5 (A) NEGATIVE mg/dL   Protein, ur 100 (A) NEGATIVE mg/dL   Nitrite NEGATIVE NEGATIVE   Leukocytes, UA SMALL (A) NEGATIVE   RBC / HPF 6-30 0 - 5 RBC/hpf   WBC, UA 0-5 0 - 5 WBC/hpf   Bacteria, UA MANY (A) NONE SEEN   Squamous Epithelial / LPF 0-5 (A) NONE SEEN   Budding Yeast PRESENT     Comment: Performed at [facility], [address], [place], [place] [address]  Urine culture     Status: Abnormal   Collection Time: 03/17/17  5:40 AM  Result Value Ref Range   Specimen Description URINE, RANDOM    Special Requests  NONE Performed at [facility], [place] [address], [place], [place] [address]    Culture MULTIPLE SPECIES PRESENT, SUGGEST RECOLLECTION (A)    Report Status 03/18/2017 FINAL   Comprehensive metabolic panel     Status: Abnormal   Collection Time: 03/17/17  5:57 AM  Result Value Ref Range   Sodium 148 (H) 135 - 145 mmol/L   Potassium 4.5 3.5 - 5.1 mmol/L   Chloride 118 (H) 101 - 111 mmol/L   CO2 13 (L) 22 - 32 mmol/L   Glucose, Bld 129 (H) 65 - 99 mg/dL   BUN 50 (H) 6 - 20 mg/dL   Creatinine, Ser 0.82 0.44 - 1.00 mg/dL   Calcium 8.5 (L) 8.9 - 10.3 mg/dL   Total Protein 7.8 6.5 - 8.1 g/dL   Albumin 3.4 (L) 3.5 - 5.0 g/dL   AST 62 (H) 15 - 41 U/L   ALT 220 (H) 14 - 54 U/L   Alkaline Phosphatase 1,231 (H) 38 - 126 U/L   Total Bilirubin 0.5 0.3 - 1.2 mg/dL   GFR calc non Af Amer >60 >60 mL/min   GFR calc Af Amer >60 >60 mL/min    Comment: (NOTE) The eGFR has been calculated using the CKD EPI equation. This calculation has not been validated in all clinical situations. eGFR's persistently <60 mL/min signify possible Chronic Kidney Disease.    Anion gap 17 (H) 5 - 15    Comment: Performed at [facility], [place] [address] [place], [place] [address]  CBC with Differential      Status: Abnormal   Collection Time: 03/17/17  5:57 AM  Result Value Ref Range   WBC 12.6 (H) 4.0 - 10.5 K/uL   RBC 2.48 (L) 3.87 - 5.11 MIL/uL   Hemoglobin 6.8 (LL) 12.0 - 15.0 g/dL    Comment: REPEATED TO VERIFY CRITICAL RESULT CALLED TO, READ BACK BY AND VERIFIED WITH: Doylene Canard AT 7846 03/17/2017 BY ZBEECH.    HCT 21.4 (L) 36.0 - 46.0 %   MCV 86.3 78.0 - 100.0 fL   MCH 27.4 26.0 - 34.0 pg   MCHC 31.8 30.0 - 36.0 g/dL   RDW 22.9 (H) 11.5 - 15.5 %   Platelets 702 (H) 150 - 400 K/uL   Neutrophils Relative % 85 %   Lymphocytes Relative 9 %   Monocytes Relative 6 %   Eosinophils Relative 0 %   Basophils Relative 0 %   Neutro Abs 10.7 (H) 1.7 - 7.7 K/uL   Lymphs Abs 1.1 0.7 - 4.0 K/uL   Monocytes Absolute 0.8 0.1 - 1.0 K/uL   Eosinophils Absolute 0.0 0.0 - 0.7 K/uL   Basophils Absolute 0.0 0.0 - 0.1 K/uL   WBC Morphology MILD LEFT SHIFT (1-5% METAS, OCC MYELO, OCC BANDS)     Comment: VACUOLATED NEUTROPHILS Performed at [facility] [address], [place], [place] [address]   I-Stat beta hCG blood, ED     Status: None   Collection Time: 03/17/17  6:06 AM  Result Value Ref Range   I-stat hCG, quantitative <5.0 <5 mIU/mL   Comment 3            Comment:   GEST. AGE      CONC.  (mIU/mL)   <=1 WEEK        5 - 50     2 WEEKS       50 - 500     3 WEEKS  100 - 10,000     4 WEEKS     1,000 - 30,000        FEMALE AND NON-PREGNANT FEMALE:     LESS THAN 5 mIU/mL   I-Stat CG4 Lactic Acid, ED     Status: Abnormal   Collection Time: 03/17/17  6:09 AM  Result Value Ref Range   Lactic Acid, Venous 2.05 (HH) 0.5 - 1.9 mmol/L   Comment NOTIFIED PHYSICIAN   POC occult blood, ED     Status: Abnormal   Collection Time: 03/17/17  7:45 AM  Result Value Ref Range   Fecal Occult Bld POSITIVE (A) NEGATIVE  Influenza panel by PCR (type A & B)     Status: None   Collection Time: 03/17/17  8:20 AM  Result Value Ref Range   Influenza A By PCR NEGATIVE NEGATIVE   Influenza B By  PCR NEGATIVE NEGATIVE    Comment: (NOTE) The Xpert Xpress Flu assay is intended as an aid in the diagnosis of  influenza and should not be used as a sole basis for treatment.  This  assay is FDA approved for nasopharyngeal swab specimens only. Nasal  washings and aspirates are unacceptable for Xpert Xpress Flu testing. Performed at [facility], [place] [address] [place], [place] [address]   Prepare RBC     Status: None   Collection Time: 03/17/17  8:20 AM  Result Value Ref Range   Order Confirmation      ORDER PROCESSED BY BLOOD BANK Performed at [facility], [place] [address], [place], [place] [address]   Type and screen [place]     Status: None   Collection Time: 03/17/17  8:20 AM  Result Value Ref Range   ABO/RH(D) AB POS    Antibody Screen NEG    Sample Expiration 03/20/2017    Unit Number [id number]    Blood Component Type RED CELLS,LR    Unit division 00    Status of Unit ISSUED,FINAL    Transfusion Status OK TO TRANSFUSE    Crossmatch Result      Compatible Performed at [facility], [place] [address] [place], [place] [address]   Hepatitis panel, acute     Status: None   Collection Time: 03/17/17  8:20 AM  Result Value Ref Range   Hepatitis B Surface Ag Negative Negative   HCV Ab <0.1 0.0 - 0.9 s/co ratio    Comment: (NOTE)                                  Negative:     < 0.8                             Indeterminate: 0.8 - 0.9                                  Positive:     > 0.9 The CDC recommends that a positive HCV antibody result be followed up with a HCV Nucleic Acid Amplification test (650354). Performed At: [facility] [place], [place] [id number] Rush Farmer MD [id number]    Hep A IgM Negative Negative   Hep B C IgM Negative Negative    Comment: Performed at [facility], [place]  [address], [place], [place] [address]  ABO/Rh     Status: None   Collection Time: 03/17/17  8:20 AM    Result Value Ref Range   ABO/RH(D)      AB POS Performed at [facility] [address], [place], [place] [address]   HIV antibody (Routine Testing)     Status: None   Collection Time: 03/17/17  9:01 AM  Result Value Ref Range   HIV Screen 4th Generation wRfx Non Reactive Non Reactive    Comment: (NOTE) Performed At: [facility] [place], [place] [id number] Rush Farmer MD [id number] Performed at [facility], [place] [address], [place], [place] [address]   Lipase, blood     Status: Abnormal   Collection Time: 03/17/17  9:18 AM  Result Value Ref Range   Lipase 128 (H) 11 - 51 U/L    Comment: Performed at [facility], [place] [address], [place], [place] [address]  Protime-INR     Status: Abnormal   Collection Time: 03/17/17  9:18 AM  Result Value Ref Range   Prothrombin Time 15.9 (H) 11.4 - 15.2 seconds   INR 1.28     Comment: Performed at [facility] [address], [place], [place] [address]  APTT     Status: None   Collection Time: 03/17/17  9:18 AM  Result Value Ref Range   aPTT 36 24 - 36 seconds    Comment: Performed at [facility] [address], [place], [place] [address]  Acetaminophen level     Status: Abnormal   Collection Time: 03/17/17  9:18 AM  Result Value Ref Range   Acetaminophen (Tylenol), Serum <10 (L) 10 - 30 ug/mL    Comment:        THERAPEUTIC CONCENTRATIONS VARY SIGNIFICANTLY. A RANGE OF 10-30 ug/mL MAY BE AN EFFECTIVE CONCENTRATION FOR MANY PATIENTS. HOWEVER, SOME ARE BEST TREATED AT CONCENTRATIONS OUTSIDE THIS RANGE. ACETAMINOPHEN CONCENTRATIONS >150 ug/mL AT 4 HOURS AFTER INGESTION AND >50 ug/mL AT 12 HOURS AFTER INGESTION ARE OFTEN ASSOCIATED WITH TOXIC REACTIONS. Performed at [facility], [place] [address], [place], [place] [address]   Lactic acid, plasma     Status: Abnormal   Collection Time: 03/17/17  9:18 AM  Result Value Ref Range   Lactic Acid, Venous 2.4 (HH) 0.5 -  1.9 mmol/L    Comment: CRITICAL RESULT CALLED TO, READ BACK BY AND VERIFIED WITH: C. [place] 9450 03/17/2017 BY A BENNETT Performed at [facility], [place] [address] [place], [place] [address]   I-Stat CG4 Lactic Acid, ED     Status: Abnormal   Collection Time: 03/17/17  9:33 AM  Result Value Ref Range   Lactic Acid, Venous 2.24 (HH) 0.5 - 1.9 mmol/L   Comment NOTIFIED PHYSICIAN   MRSA PCR Screening     Status: Abnormal   Collection Time: 03/17/17  6:36 PM  Result Value Ref Range   MRSA by PCR POSITIVE (A) NEGATIVE    Comment:        The GeneXpert MRSA Assay (FDA approved for NASAL specimens only), is one component of a comprehensive MRSA colonization surveillance program. It is not intended to diagnose MRSA infection nor to guide or monitor treatment for MRSA infections. RESULT CALLED TO, READ BACK BY AND VERIFIED WITH: A..POTEAT,RN AT 2255 BY L.PITT 8/00/34   Basic metabolic panel     Status: Abnormal   Collection Time: 03/18/17  5:41 AM  Result Value Ref Range   Sodium 152 (H) 135 - 145 mmol/L   Potassium 4.1 3.5 - 5.1 mmol/L   Chloride 126 (H) 101 - 111 mmol/L   CO2 11 (L) 22 - 32 mmol/L   Glucose, Bld 102 (H) 65 - 99 mg/dL   BUN 30 (H) 6 - 20 mg/dL   Creatinine, Ser 0.66 0.44 - 1.00 mg/dL   Calcium 8.0 (L) 8.9 - 10.3 mg/dL   GFR calc non Af Amer >60 >60 mL/min   GFR calc Af Amer >60 >60 mL/min    Comment: (NOTE) The eGFR has been calculated using the CKD EPI equation. This calculation has not been validated in all clinical situations. eGFR's persistently <60 mL/min signify possible Chronic Kidney Disease.    Anion gap 15 5 - 15    Comment: Performed at [facility] [address] [place], [place] [address]  CBC     Status: Abnormal   Collection Time: 03/18/17  5:41 AM  Result Value Ref Range   WBC 7.5 4.0 - 10.5 K/uL   RBC 3.20 (L) 3.87 - 5.11 MIL/uL   Hemoglobin 8.8 (L) 12.0 - 15.0 g/dL    Comment: REPEATED TO VERIFY DELTA CHECK NOTED POST  TRANSFUSION SPECIMEN    HCT 27.7 (L) 36.0 - 46.0 %   MCV 86.6 78.0 - 100.0 fL   MCH 27.5 26.0 - 34.0 pg   MCHC 31.8 30.0 - 36.0 g/dL   RDW 20.2 (H) 11.5 - 15.5 %   Platelets 478 (H) 150 - 400 K/uL    Comment: Performed at [facility], [place] [address] [place], [place] [address]  Lactic acid, plasma     Status: None   Collection Time: 03/18/17  7:42 AM  Result Value Ref Range   Lactic Acid, Venous 0.6 0.5 - 1.9 mmol/L    Comment: Performed at [facility] [address] [place], [place] [address]  Hepatic function panel     Status: Abnormal   Collection Time: 03/18/17  7:42 AM  Result Value Ref Range   Total Protein 6.1 (L) 6.5 - 8.1 g/dL   Albumin 2.5 (L) 3.5 - 5.0 g/dL   AST 33 15 - 41 U/L   ALT 123 (H) 14 - 54 U/L   Alkaline Phosphatase 930 (H) 38 - 126 U/L   Total Bilirubin 0.8 0.3 - 1.2 mg/dL   Bilirubin, Direct <0.1 (L) 0.1 - 0.5 mg/dL   Indirect Bilirubin NOT CALCULATED 0.3 - 0.9 mg/dL    Comment: Performed at [facility] [address], [place], [place] [address]   Mr Pelvis Wo Contrast  Result Date: 03/17/2017 CLINICAL DATA:  Large decubitus ulcers EXAM: MRI PELVIS WITHOUT CONTRAST TECHNIQUE: Multiplanar multisequence MR imaging of the pelvis was performed. No intravenous contrast was administered. COMPARISON:  MRI 03/07/2016 FINDINGS: There is a sacral decubitus ulcer extending right down to the bone with underlying T2 signal abnormality in the S3 segment of the sacrum consistent with osteomyelitis. Decubitus ulcer over the left ischial tuberosity extending down to the bone. Abnormal T2 signal intensity in the bone suggesting osteomyelitis. No definite findings for septic arthritis or osteomyelitis involving the hips. 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 Edema like signal abnormality in the adductor muscles bilaterally, left greater than right suggesting myositis. Similar signal abnormality in the left thigh and hip muscles. No  definite findings for pyomyositis. No significant intrapelvic abnormalities. No intrapelvic abscess is identified.  The SI joints are intact. IMPRESSION: 1. Deep sacral decubitus ulcer with underlying osteomyelitis. 2. Deep decubitus ulcer over the left ischial tuberosity with osteomyelitis. 3. Diffuse myositis without definite findings for pyomyositis or discrete drainable soft tissue abscess. 4. No definite findings for septic arthritis. 5. 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 6. No definite intrapelvic abscess. Electronically Signed   By: Marijo Sanes M.D.   On: 03/17/2017 21:06   Dg Chest Port 1 View  Result Date: 03/17/2017 CLINICAL DATA:  Sepsis. EXAM: PORTABLE CHEST 1 VIEW COMPARISON:  03/17/2017 FINDINGS: Cardiomediastinal silhouette is normal. Mediastinal contours appear intact. There is no evidence of pneumothorax. Stable hyperinflation of the lungs. Stable left hemidiaphragmatic juxta peaking. Bilateral apical pleural thickening. Osseous structures are without acute abnormality. Soft tissues are grossly normal. IMPRESSION: Stable hyperinflation of the lungs. Stable left hemidiaphragmatic juxta peaking which may be seen with scarring or sub pulmonic effusion. Electronically Signed   By: Fidela Salisbury M.D.   On: 03/17/2017 09:25   Dg Chest Port 1 View  Result Date: 03/17/2017 CLINICAL DATA:  Altered mental status, abdominal pain beginning last night. History of multiple sclerosis. EXAM: PORTABLE CHEST 1 VIEW COMPARISON:  Chest radiograph August 31, 2016 FINDINGS: Cardiomediastinal silhouette is normal. Elevated LEFT hemidiaphragm with juxta peaking. Hyperinflation. Apical pleural thickening. No pneumothorax. Osteopenia. Cachexia. IMPRESSION: LEFT lung base juxtapeaking seen with scarring or subpulmonic effusion. Stable hyperinflation. Electronically Signed   By: Elon Alas M.D.   On: 03/17/2017 06:27   US Abdomen Limited Ruq  Result Date: 03/17/2017 CLINICAL  DATA:  Elevated LFTs. EXAM: ULTRASOUND ABDOMEN LIMITED RIGHT UPPER QUADRANT COMPARISON:  09/06/2016. FINDINGS: Gallbladder: No gallstones or wall thickening visualized. No sonographic Murphy sign noted by sonographer. Common bile duct: Diameter: 1.1 mm Liver: No focal lesion identified. Within normal limits in parenchymal echogenicity. Portal vein is patent on color Doppler imaging with normal direction of blood flow towards the liver. IMPRESSION: No acute or focal abnormality identified. No gallstones or biliary distention. Liver appears normal. Electronically Signed   By: Marcello Moores  Register   On: 03/17/2017 10:06      Assessment/Plan Principal Problem:   Sepsis ([place]) Active Problems:   Protein-calorie malnutrition, severe ([place])   Dysphagia, oral phase   Anemia of chronic disease   Neurogenic bladder   Pressure ulcer, stage 4 (HCC)   Chronic pain   Chronic constipation   Paraplegia (H[facility])  Sacral and b/l ischial decubs - do not believe that surgical intervention is indicated at this time. Wounds are fairly clean appearing - MRI showed underlying osteomyelitis, Diffuse myositis without definite findings for pyomyositis or discrete drainable soft tissue abscess - recommend hydrotherapy - we will follow wound every other day in the event a surgical needs arises  Thank you for the consult.   Kalman Drape, [facility] 03/18/2017, 1:36 PM Pager: [phone] Consults: [phone] Mon-Fri 7:00 am-4:30 pm Sat-Sun 7:00 am-11:30 am

## 2017-03-18 NOTE — Clinical Social Work Note (Signed)
Clinical Social Work Assessment  Patient Details  Name: Deanna Baldwin MRN: [id number] Date of Birth: [birth year]  Date of referral:  03/18/17               Reason for consult:  Discharge Planning                Permission sought to share information with:  Facility Sport and exercise psychologist, Family Supports Permission granted to share information::  Yes, Verbal Permission Granted  Name::     Financial planner::  SNFs  Relationship::  Aunt  Contact Information:  [phone]  Housing/Transportation Living arrangements for the past 2 months:  [place] of Information:  Other (Comment Required) Patient Interpreter Needed:  None Criminal Activity/Legal Involvement Pertinent to Current Situation/Hospitalization:  No - Comment as needed Significant Relationships:  Siblings, Other Family Members Lives with:  Facility Resident Do you feel safe going back to the place where you live?  Yes Need for family participation in patient care:  Yes (Comment)  Care giving concerns:  CSW received consult regarding discharge plan. CSW spoke with patient's Aunt. She stated patient resides at [facility] and will return there at discharge. CSW to continue to follow and assist with discharge planning needs.   Social Worker assessment / plan:  CSW spoke with patient's Aunt regarding return to snf.  Employment status:  Disabled (Comment on whether or not currently receiving Disability) Insurance information:  Managed Medicare PT Recommendations:  Not assessed at this time Information / Referral to community resources:  [place]  Patient/Family's Response to care:  Patient's Aunt reported agreement with dc plan and patient will require PTAR.   Patient/Family's Understanding of and Emotional Response to Diagnosis, Current Treatment, and Prognosis:  Patient/family is realistic regarding therapy needs and expressed being hopeful for SNF placement. Patient's Aunt expressed  understanding of CSW role and discharge process as well as medical condition. No questions/concerns about plan or treatment.    Emotional Assessment Appearance:  Appears older than stated age Attitude/Demeanor/Rapport:  Unable to Assess Affect (typically observed):  Unable to Assess Orientation:  Oriented to Self, Oriented to Place Alcohol / Substance use:  Not Applicable Psych involvement (Current and /or in the community):  No (Comment)  Discharge Needs  Concerns to be addressed:  Care Coordination Readmission within the last 30 days:  No Current discharge risk:  None Barriers to Discharge:  Continued Medical Work up   [employer], [place] 03/18/2017, 1:45 PM

## 2017-03-19 DIAGNOSIS — M869 Osteomyelitis, unspecified: Secondary | ICD-10-CM | POA: Diagnosis present

## 2017-03-19 DIAGNOSIS — M8619 Other acute osteomyelitis, multiple sites: Secondary | ICD-10-CM

## 2017-03-19 LAB — BASIC METABOLIC PANEL
Anion gap: 14 (ref 5–15)
BUN: 18 mg/dL (ref 6–20)
CALCIUM: 7.6 mg/dL — AB (ref 8.9–10.3)
CO2: 14 mmol/L — ABNORMAL LOW (ref 22–32)
CREATININE: 0.61 mg/dL (ref 0.44–1.00)
Chloride: 118 mmol/L — ABNORMAL HIGH (ref 101–111)
GFR calc Af Amer: >60 mL/min (ref >60)
GLUCOSE: 148 mg/dL — AB (ref 65–99)
Potassium: 3.3 mmol/L — ABNORMAL LOW (ref 3.5–5.1)
Sodium: 146 mmol/L — ABNORMAL HIGH (ref 135–145)

## 2017-03-19 LAB — GAMMA GT: GGT: 1034 U/L — ABNORMAL HIGH (ref 7–50)

## 2017-03-19 LAB — CBC
HCT: 24.5 % — ABNORMAL LOW (ref 36.0–46.0)
Hemoglobin: 8.1 g/dL — ABNORMAL LOW (ref 12.0–15.0)
MCH: 27.7 pg (ref 26.0–34.0)
MCHC: 33.1 g/dL (ref 30.0–36.0)
MCV: 83.9 fL (ref 78.0–100.0)
PLATELETS: 479 10*3/uL — AB (ref 150–400)
RBC: 2.92 MIL/uL — ABNORMAL LOW (ref 3.87–5.11)
RDW: 19.8 % — AB (ref 11.5–15.5)
WBC: 7.7 10*3/uL (ref 4.0–10.5)

## 2017-03-19 LAB — HEPATIC FUNCTION PANEL
ALT: 134 U/L — ABNORMAL HIGH (ref 14–54)
AST: 103 U/L — ABNORMAL HIGH (ref 15–41)
Albumin: 2.4 g/dL — ABNORMAL LOW (ref 3.5–5.0)
Alkaline Phosphatase: 1131 U/L — ABNORMAL HIGH (ref 38–126)
Total Bilirubin: 0.5 mg/dL (ref 0.3–1.2)
Total Protein: 5.6 g/dL — ABNORMAL LOW (ref 6.5–8.1)

## 2017-03-19 MED ORDER — POTASSIUM CHLORIDE CRYS ER 20 MEQ PO TBCR
40.0000 meq | EXTENDED_RELEASE_TABLET | Freq: Once | ORAL | Status: AC
  Administered 2017-03-19: 40 meq via ORAL
  Filled 2017-03-19: qty 2

## 2017-03-19 NOTE — Progress Notes (Signed)
   INFECTIOUS DISEASE ATTENDING ADDENDUM:   My understanding is that no one is planning on doing bone cultures or soft tissue cultures.  If this is indeed the case then my plan for IV antibiotics would be as follows  Diagnosis: Sacral osteomyelitis  Culture Result: None   No Known Allergies  OPAT Orders Discharge antibiotics: Ceftriaxone 2 g IV daily, vancomycin per pharmacy, and oral metronidazole 500 mg 3 times daily  Aim for Vancomycin trough 15-20 (unless otherwise indicated) Duration: 8 weeks End Date: April 13 th, 2019  PIC Care Per Protocol: BIWEEKLY LABS WHILE ON IV ABX:  _x_ BMP w GFR  Labs weekly while on IV antibiotics: _x_ CBC with differential  _x_ CRP x__ ESR _x_ Vancomycin trough  _x_ Please pull PIC at completion of IV antibiotics __ Please leave PIC in place until doctor has seen patient or been notified  Fax weekly labs to [phone]  Clinic Follow Up Appt:  Next 4 weeks  I will sign off for now please call with further questions.      

## 2017-03-19 NOTE — Progress Notes (Signed)
PROGRESS NOTE    Deanna Baldwin  PYP:[id number] DOB: Feb 08, 1975 DOA: 03/17/2017 PCP: [name], DO     Brief Narrative:  Deanna Baldwin is a 42 y.o. 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 In the ED, she was noted to be confused and groaning. She was admitted for sepsis, unclear etiology, started on vanco/zosyn. Wound RN recommended MR pelvis to evaluate her chronic wounds which revealed osteomyelitis.   Assessment & Plan:   Principal Problem:   Osteomyelitis ([place]) Active Problems:   Elevated liver function tests   Protein-calorie malnutrition, severe (HCC)   Palliative care by specialist   Dysphagia, oral phase   Anemia of chronic disease   Neurogenic bladder   Sepsis (HCC)   Pressure ulcer, stage 4 (HCC)   Chronic pain   Chronic constipation   Paraplegia (HCC)   SIRS (systemic inflammatory response syndrome) (HCC)   Altered mental status   Dehydration   Acute osteomyelitis of left pelvic region and thigh ([place])  Acute metabolic encephalopathy -Improving. Alert and oriented on my exam and able to answer most questions.   Sepsis secondary to sacral and left ischial osteomyelitis -MR pelvis: sacral decubitus ulcer with underlying osteomyelitis, deep decubitus ulcer over the left ischial tuberosity with osteomyelitis. Diffuse myositis without definite findings for pyomyositis or discrete drainable soft tissue abscess -General surgery consulted. Recommending hydrotherapy, no plans for surgery. General surgery does not do bone biopsy  -ID consulted. Recommending bone biopsy/wound culture for antibiotic guidance -Spoke with plastic surgery 2/15 for consult, Dr. Marla Roe would consult once patient's wounds have improved for flap consideration -Spoke with  orthopedic surgery 2/16 for bone biopsy, they do not recommend bone biopsy as it would be contaminated with open wound that patient presents with  -Blood cultures negative to date  -Vanco/zosyn started in ED. Holding now. Unfortunately without plans for specimen collection, will have to consider long-term antibiotics per ID recommendations  -PT for hydrotherapy   Hypernatremia -With poor PO intake -IVF D5 0.45NS  -Trend BMP. Improving   Elevated liver enzymes -RUQ [place]: No acute or focal abnormality identified. No gallstones or biliary distention. Liver appears normal. -Hepatitis panel negative  -GGT 1034 -Stop tylenol   Anemia of chronic disease -FOBT positive on admission, has hx of hemorrhoids. Transfused 1u pRBC on admission  -Watch for gross GI bleeding -Transfuse if hgb < 7  Multiple sclerosis with spatic paraplegia, neurogenic bladder -Supportive care  -Urostomy care  -Continue baclofen -Turn q2h   Chronic hypotension -Continue midodrine   Chronic constipation -Bowel regimen  Goals of care -Palliative care consulted  DVT prophylaxis: SCD Code Status: Full Family Communication: Spoke with sister over the phone  Disposition Plan: Pending improvement, back to SNF when stable   Consultants:   General Surgery  ID   Plastic surgery (over the phone)  Orthopedic surgery (over the phone)   Procedures:   None  Antimicrobials:  Anti-infectives (From admission, onward)   Start     Dose/Rate Route Frequency Ordered Stop   03/18/17 0600  vancomycin (VANCOCIN) IVPB 750 mg/150 ml premix  Status:  Discontinued     750 mg 150 mL/hr over 60 Minutes Intravenous Every 24 hours 03/17/17 0753 03/18/17 1118   03/17/17 1400  piperacillin-tazobactam (ZOSYN) IVPB 3.375 g  Status:  Discontinued     3.375 g 12.5  mL/hr over 240 Minutes Intravenous Every 8 hours 03/17/17 0753 03/18/17 1118   03/17/17 0615  piperacillin-tazobactam (ZOSYN) IVPB 3.375 g     3.375 g 100 mL/hr  over 30 Minutes Intravenous  Once 03/17/17 0609 03/17/17 0740   03/17/17 0615  vancomycin (VANCOCIN) IVPB 1000 mg/200 mL premix     1,000 mg 200 mL/hr over 60 Minutes Intravenous  Once 03/17/17 0609 03/17/17 0801       Subjective: No new events/complaints. No pain   Objective: Vitals:   03/19/17 0329 03/19/17 0510 03/19/17 1316 03/19/17 1319  BP:  98/60 98/60   Pulse:  88 (!) 101   Resp:  18 16   Temp:  97.9 F (36.6 C)  98.2 F (36.8 C)  TempSrc:  Oral    SpO2:  98% 99%   Weight: 38.1 kg (84 lb)     Height:        Intake/Output Summary (Last 24 hours) at 03/19/2017 1327 Last data filed at 03/19/2017 0600 Gross per 24 hour  Intake 1880 ml  Output 900 ml  Net 980 ml   Filed Weights   03/17/17 1700 03/18/17 0502 03/19/17 0329  Weight: 40 kg (88 lb 3.2 oz) 39 kg (86 lb) 38.1 kg (84 lb)    Examination: General exam: Appears calm and comfortable, frail and chronically ill appearing  Respiratory system: Clear to auscultation. Respiratory effort normal. Cardiovascular system: S1 & S2 heard, RRR. No JVD, murmurs, rubs, gallops or clicks. No pedal edema. Gastrointestinal system: Abdomen is nondistended, soft and nontender. No organomegaly or masses felt. Normal bowel sounds heard. +urostomy  Central nervous system: Alert and oriented. No focal neurological deficits. Extremities: +contractures with muscular atrophy  Skin: wounds not examined today  Psychiatry: Judgement and insight appear normal. Mood & affect appropriate.    Data Reviewed: I have personally reviewed following labs and imaging studies  CBC: Recent Labs  Lab 03/17/17 0557 03/18/17 0541 03/19/17 0340  WBC 12.6* 7.5 7.7  NEUTROABS 10.7*  --   --   HGB 6.8* 8.8* 8.1*  HCT 21.4* 27.7* 24.5*  MCV 86.3 86.6 83.9  PLT 702* 478* 426*   Basic Metabolic Panel: Recent Labs  Lab 03/17/17 0557 03/18/17 0541 03/19/17 0340  NA 148* 152* 146*  K 4.5 4.1 3.3*  CL 118* 126* 118*  CO2 13* 11* 14*  GLUCOSE  129* 102* 148*  BUN 50* 30* 18  CREATININE 0.82 0.66 0.61  CALCIUM 8.5* 8.0* 7.6*   GFR: Estimated Creatinine Clearance: 56.2 mL/min (by C-G formula based on SCr of 0.61 mg/dL). Liver Function Tests: Recent Labs  Lab 03/17/17 0557 03/18/17 0742 03/19/17 0745  AST 62* 33 103*  ALT 220* 123* 134*  ALKPHOS 1,231* 930* 1,131*  BILITOT 0.5 0.8 0.5  PROT 7.8 6.1* 5.6*  ALBUMIN 3.4* 2.5* 2.4*   Recent Labs  Lab 03/17/17 0918  LIPASE 128*   No results for input(s): AMMONIA in the last 168 hours. Coagulation Profile: Recent Labs  Lab 03/17/17 0918  INR 1.28   Cardiac Enzymes: No results for input(s): CKTOTAL, CKMB, CKMBINDEX, TROPONINI in the last 168 hours. BNP (last 3 results) No results for input(s): PROBNP in the last 8760 hours. HbA1C: No results for input(s): HGBA1C in the last 72 hours. CBG: No results for input(s): GLUCAP in the last 168 hours. Lipid Profile: No results for input(s): CHOL, HDL, LDLCALC, TRIG, CHOLHDL, LDLDIRECT in the last 72 hours. Thyroid Function Tests: No results for input(s): TSH, T4TOTAL, FREET4, T3FREE, THYROIDAB  in the last 72 hours. Anemia Panel: No results for input(s): VITAMINB12, FOLATE, FERRITIN, TIBC, IRON, RETICCTPCT in the last 72 hours. Sepsis Labs: Recent Labs  Lab 03/17/17 0609 03/17/17 0918 03/17/17 0933 03/18/17 0742  LATICACIDVEN 2.05* 2.4* 2.24* 0.6    Recent Results (from the past 240 hour(s))  Urine culture     Status: Abnormal   Collection Time: 03/17/17  5:40 AM  Result Value Ref Range Status   Specimen Description URINE, RANDOM  Final   Special Requests   Final    NONE Performed at [facility], [place] [address] [place], [place] [address]    Culture MULTIPLE SPECIES PRESENT, SUGGEST RECOLLECTION (A)  Final   Report Status 03/18/2017 FINAL  Final  Blood Culture (routine x 2)     Status: None (Preliminary result)   Collection Time: 03/17/17  8:19 AM  Result Value Ref Range Status   Specimen  Description BLOOD LEFT FOREARM  Final   Special Requests IN PEDIATRIC BOTTLE Blood Culture adequate volume  Final   Culture   Final    NO GROWTH 1 DAY Performed at [facility], [place] [address] [place], [place] [address]    Report Status PENDING  Incomplete  Blood Culture (routine x 2)     Status: None (Preliminary result)   Collection Time: 03/17/17  8:20 AM  Result Value Ref Range Status   Specimen Description BLOOD LEFT FEMORAL ARTERY  Final   Special Requests IN PEDIATRIC BOTTLE Blood Culture adequate volume  Final   Culture   Final    NO GROWTH 1 DAY Performed at [facility], [place] [address] [place], [place] [address]    Report Status PENDING  Incomplete  MRSA PCR Screening     Status: Abnormal   Collection Time: 03/17/17  6:36 PM  Result Value Ref Range Status   MRSA by PCR POSITIVE (A) NEGATIVE Final    Comment:        The GeneXpert MRSA Assay (FDA approved for NASAL specimens only), is one component of a comprehensive MRSA colonization surveillance program. It is not intended to diagnose MRSA infection nor to guide or monitor treatment for MRSA infections. RESULT CALLED TO, READ BACK BY AND VERIFIED WITH: A..POTEAT,RN AT 2255 BY L.PITT 03/17/17        Radiology Studies: Mr Pelvis Wo Contrast  Result Date: 03/17/2017 CLINICAL DATA:  Large decubitus ulcers EXAM: MRI PELVIS WITHOUT CONTRAST TECHNIQUE: Multiplanar multisequence MR imaging of the pelvis was performed. No intravenous contrast was administered. COMPARISON:  MRI 03/07/2016 FINDINGS: There is a sacral decubitus ulcer extending right down to the bone with underlying T2 signal abnormality in the S3 segment of the sacrum consistent with osteomyelitis. Decubitus ulcer over the left ischial tuberosity extending down to the bone. Abnormal T2 signal intensity in the bone suggesting osteomyelitis. No definite findings for septic arthritis or osteomyelitis involving the hips. Extensive heterotopic  ossification surrounding the left hip. Edema like signal abnormality in the adductor muscles bilaterally, left greater than right suggesting myositis. Similar signal abnormality in the left thigh and hip muscles. No definite findings for pyomyositis. No significant intrapelvic abnormalities. No intrapelvic abscess is identified. The SI joints are intact. IMPRESSION: 1. Deep sacral decubitus ulcer with underlying osteomyelitis. 2. Deep decubitus ulcer over the left ischial tuberosity with osteomyelitis. 3. Diffuse myositis without definite findings for pyomyositis or discrete drainable soft tissue abscess. 4. No definite findings for septic arthritis. 5. Extensive heterotopic ossification surrounding the left  hip. 6. No definite intrapelvic abscess. Electronically Signed   By: Marijo Sanes M.D.   On: 03/17/2017 21:06      Scheduled Meds: . baclofen  20 mg Oral QID  . bisacodyl  10 mg Oral Daily  . Chlorhexidine Gluconate Cloth  6 each Topical Q0600  . collagenase   Topical Daily  . docusate sodium  100 mg Oral BID  . famotidine  20 mg Oral BH-q7a  . feeding supplement (ENSURE ENLIVE)  237 mL Oral TID BM  . feeding supplement (PRO-STAT SUGAR FREE 64)  30 mL Oral BID  . ibuprofen  200 mg Oral TID  . midodrine  2.5 mg Oral TID WC  . mupirocin ointment  1 application Nasal BID  . polyethylene glycol  17 g Oral BID   Continuous Infusions: . dextrose 5 % and 0.45% NaCl 100 mL/hr at 03/19/17 0357     LOS: 2 days    Time spent: 30 minutes   Dessa Phi, DO [facility] [email or web address] Password [email or web address] 03/19/2017, 1:27 PM

## 2017-03-19 NOTE — Progress Notes (Signed)
Physical Therapy Wound Treatment Patient Details  Name: Deanna Baldwin MRN: [id number] Date of Birth: 1975-07-18  Today's Date: 03/19/2017 Time: 1600-1705 Time Calculation (min): 65 min  Subjective  Subjective: Pt asking how long wounds might take to heal. Patient and Family Stated Goals: get better Date of Onset: (2 years ago)  Pain Score:  No pain  Wound Assessment  Pressure Injury 03/17/17 Stage IV - Full thickness tissue loss with exposed bone, tendon or muscle. Sacrum (Active)  Dressing Type Barrier Film (skin prep);Foam;Gauze (Comment);Moist to dry 03/19/2017  4:00 PM  Dressing Changed;Clean;Dry;Intact 03/19/2017  4:00 PM  Dressing Change Frequency Daily 03/19/2017  4:00 PM  State of Healing Non-healing 03/19/2017  4:00 PM  Site / Wound Assessment Pale;Pink;Red;Yellow 03/19/2017  4:00 PM  % Wound base Red or Granulating 80% 03/19/2017  4:00 PM  % Wound base Yellow/Fibrinous Exudate 20% 03/19/2017  4:00 PM  % Wound base Black/Eschar 0% 03/19/2017  4:00 PM  % Wound base Other/Granulation Tissue (Comment) 0% 03/19/2017  4:00 PM  Peri-wound Assessment Pink 03/19/2017  4:00 PM  Wound Length (cm) 5 cm 03/19/2017  4:00 PM  Wound Width (cm) 5 cm 03/19/2017  4:00 PM  Wound Depth (cm) 0.1 cm 03/19/2017  4:00 PM  Wound Surface Area (cm^2) 25 cm^2 03/19/2017  4:00 PM  Wound Volume (cm^3) 2.5 cm^3 03/19/2017  4:00 PM  Undermining (cm) .5 cm from 9 to 12 oclock 03/19/2017  4:00 PM  Margins Unattached edges (unapproximated) 03/19/2017  4:00 PM  Drainage Amount Moderate 03/19/2017  4:00 PM  Drainage Description Purulent 03/19/2017  4:00 PM  Treatment Debridement (Selective);Hydrotherapy (Pulse lavage);Packing (Saline gauze) 03/19/2017  4:00 PM     Pressure Injury 03/17/17 Stage III -  Full thickness tissue loss. Subcutaneous fat may be visible but bone, tendon or muscle are NOT exposed. rt ischial tuberosity (Active)  Dressing Type Barrier Film (skin prep);Foam;Silver hydrofiber 03/19/2017  4:00 PM  Dressing  Changed;Clean;Dry;Intact 03/19/2017  4:00 PM  Dressing Change Frequency Daily 03/19/2017  4:00 PM  State of Healing Non-healing 03/19/2017  4:00 PM  Site / Wound Assessment Pale;Pink;Red;Yellow 03/19/2017  4:00 PM  % Wound base Red or Granulating 90% 03/19/2017  4:00 PM  % Wound base Yellow/Fibrinous Exudate 10% 03/19/2017  4:00 PM  % Wound base Black/Eschar 0% 03/19/2017  4:00 PM  % Wound base Other/Granulation Tissue (Comment) 0% 03/19/2017  4:00 PM  Peri-wound Assessment Intact 03/19/2017  4:00 PM  Wound Length (cm) 5 cm 03/19/2017  4:00 PM  Wound Width (cm) 4 cm 03/19/2017  4:00 PM  Wound Depth (cm) 0.2 cm 03/19/2017  4:00 PM  Wound Surface Area (cm^2) 20 cm^2 03/19/2017  4:00 PM  Wound Volume (cm^3) 4 cm^3 03/19/2017  4:00 PM  Margins Unattached edges (unapproximated) 03/19/2017  4:00 PM  Drainage Amount Moderate 03/19/2017  4:00 PM  Drainage Description Purulent 03/19/2017  4:00 PM  Treatment Hydrotherapy (Pulse lavage) 03/19/2017  4:00 PM     Pressure Injury 03/17/17 Stage III -  Full thickness tissue loss. Subcutaneous fat may be visible but bone, tendon or muscle are NOT exposed. Left ischial tuberosity (Active)  Dressing Type Silver hydrofiber;Foam;Barrier Film (skin prep) 03/19/2017  4:00 PM  Dressing Changed;Clean;Dry;Intact 03/19/2017  4:00 PM  Dressing Change Frequency Daily 03/19/2017  4:00 PM  State of Healing Non-healing 03/19/2017  4:00 PM  Site / Wound Assessment Pale;Pink;Red;Yellow 03/19/2017  4:00 PM  % Wound base Red or Granulating 80% 03/19/2017  4:00 PM  % Wound base Yellow/Fibrinous Exudate  20% 03/19/2017  4:00 PM  % Wound base Black/Eschar 0% 03/19/2017  4:00 PM  % Wound base Other/Granulation Tissue (Comment) 0% 03/19/2017  4:00 PM  Peri-wound Assessment Denuded 03/19/2017  4:00 PM  Wound Length (cm) 3.5 cm 03/19/2017  4:00 PM  Wound Width (cm) 5 cm 03/19/2017  4:00 PM  Wound Depth (cm) 0.5 cm 03/19/2017  4:00 PM  Wound Surface Area (cm^2) 17.5 cm^2 03/19/2017  4:00 PM  Wound Volume  (cm^3) 8.75 cm^3 03/19/2017  4:00 PM  Undermining (cm) .5-1.5 from 9 to 1 oclock 03/19/2017  4:00 PM  Margins Unattached edges (unapproximated) 03/19/2017  4:00 PM  Drainage Amount Moderate 03/19/2017  4:00 PM  Drainage Description Purulent 03/19/2017  4:00 PM  Treatment Debridement (Selective);Hydrotherapy (Pulse lavage) 03/19/2017  4:00 PM   Hydrotherapy Pulsed lavage therapy - wound location: sacrum, rt ischial tuberosity, lt ischial tuberosity Pulsed Lavage with Suction (psi): 8 psi(4-8) Pulsed Lavage with Suction - Normal Saline Used: 1000 mL Pulsed Lavage Tip: Tip with splash shield Selective Debridement Selective Debridement - Location: sacrum and lt ischial tuberosity Selective Debridement - Tools Used: Forceps;Scissors Selective Debridement - Tissue Removed: yellow necrotic   Wound Assessment and Plan  Wound Therapy - Assess/Plan/Recommendations Wound Therapy - Clinical Statement: Pt presents to hydrotherapy with multiple pressure injuries. Wounds are fairly clean and expect will only need very brief course of hydrotherapy. Wound Therapy - Functional Problem List: Decr mobility and sitting due to severe LE contractures, wounds, weakness. Factors Delaying/Impairing Wound Healing: Altered sensation;Immobility;Multiple medical problems;Polypharmacy Hydrotherapy Plan: Debridement;Dressing change;Patient/family education;Pulsatile lavage with suction Wound Therapy - Frequency: 6X / week Wound Therapy - Follow Up Recommendations: Skilled nursing facility Wound Plan: See above  Wound Therapy Goals- Improve the function of patient's integumentary system by progressing the wound(s) through the phases of wound healing (inflammation - proliferation - remodeling) by: Decrease Necrotic Tissue to: 5 Decrease Necrotic Tissue - Progress: Goal set today Increase Granulation Tissue to: 95 Increase Granulation Tissue - Progress: Goal set today Goals/treatment plan/discharge plan were made with and  agreed upon by patient/family: Yes Time For Goal Achievement: 7 days Wound Therapy - Potential for Goals: Good  Goals will be updated until maximal potential achieved or discharge criteria met.  Discharge criteria: when goals achieved, discharge from hospital, MD decision/surgical intervention, no progress towards goals, refusal/missing three consecutive treatments without notification or medical reason.  GP     Shary Decamp [place] 03/19/2017, 6:29 PM [employer] PT [phone]

## 2017-03-20 LAB — COMPREHENSIVE METABOLIC PANEL
ALBUMIN: 2.3 g/dL — AB (ref 3.5–5.0)
ALT: 125 U/L — AB (ref 14–54)
AST: 93 U/L — AB (ref 15–41)
Alkaline Phosphatase: 1066 U/L — ABNORMAL HIGH (ref 38–126)
Anion gap: 11 (ref 5–15)
BUN: 20 mg/dL (ref 6–20)
CHLORIDE: 113 mmol/L — AB (ref 101–111)
CO2: 20 mmol/L — AB (ref 22–32)
CREATININE: 0.4 mg/dL — AB (ref 0.44–1.00)
Calcium: 7.5 mg/dL — ABNORMAL LOW (ref 8.9–10.3)
GFR calc Af Amer: >60 mL/min (ref >60)
GFR calc non Af Amer: >60 mL/min (ref >60)
Glucose, Bld: 101 mg/dL — ABNORMAL HIGH (ref 65–99)
Potassium: 3.8 mmol/L (ref 3.5–5.1)
SODIUM: 144 mmol/L (ref 135–145)
Total Bilirubin: 0.6 mg/dL (ref 0.3–1.2)
Total Protein: 5.6 g/dL — ABNORMAL LOW (ref 6.5–8.1)

## 2017-03-20 LAB — CBC
HCT: 25 % — ABNORMAL LOW (ref 36.0–46.0)
Hemoglobin: 8.1 g/dL — ABNORMAL LOW (ref 12.0–15.0)
MCH: 27.4 pg (ref 26.0–34.0)
MCHC: 32.4 g/dL (ref 30.0–36.0)
MCV: 84.5 fL (ref 78.0–100.0)
PLATELETS: 493 10*3/uL — AB (ref 150–400)
RBC: 2.96 MIL/uL — AB (ref 3.87–5.11)
RDW: 19.9 % — ABNORMAL HIGH (ref 11.5–15.5)
WBC: 8.8 10*3/uL (ref 4.0–10.5)

## 2017-03-20 LAB — VANCOMYCIN, RANDOM

## 2017-03-20 MED ORDER — METRONIDAZOLE 500 MG PO TABS
500.0000 mg | ORAL_TABLET | Freq: Three times a day (TID) | ORAL | Status: DC
  Administered 2017-03-20 – 2017-03-22 (×6): 500 mg via ORAL
  Filled 2017-03-20 (×6): qty 1

## 2017-03-20 MED ORDER — SODIUM CHLORIDE 0.9% FLUSH
10.0000 mL | Freq: Two times a day (BID) | INTRAVENOUS | Status: DC
  Administered 2017-03-21 – 2017-03-22 (×2): 10 mL

## 2017-03-20 MED ORDER — VANCOMYCIN HCL IN DEXTROSE 1-5 GM/200ML-% IV SOLN
1000.0000 mg | INTRAVENOUS | Status: DC
  Administered 2017-03-20 – 2017-03-21 (×2): 1000 mg via INTRAVENOUS
  Filled 2017-03-20 (×3): qty 200

## 2017-03-20 MED ORDER — VANCOMYCIN HCL 500 MG IV SOLR
500.0000 mg | Freq: Three times a day (TID) | INTRAVENOUS | Status: DC
  Filled 2017-03-20 (×2): qty 500

## 2017-03-20 MED ORDER — CEFTRIAXONE SODIUM 2 G IJ SOLR
2.0000 g | INTRAMUSCULAR | Status: DC
  Administered 2017-03-20 – 2017-03-22 (×3): 2 g via INTRAVENOUS
  Filled 2017-03-20 (×3): qty 20

## 2017-03-20 NOTE — Progress Notes (Addendum)
Peripherally Inserted Central Catheter/Midline Placement  The IV Nurse has discussed with the patient and/or persons authorized to consent for the patient, the purpose of this procedure and the potential benefits and risks involved with this procedure.  The benefits include less needle sticks, lab draws from the catheter, and the patient may be discharged home with the catheter. Risks include, but not limited to, infection, bleeding, blood clot (thrombus formation), and puncture of an artery; nerve damage and irregular heartbeat and possibility to perform a PICC exchange if needed/ordered by physician.  Alternatives to this procedure were also discussed.  Bard Power PICC patient education guide, fact sheet on infection prevention and patient information card has been provided to patient /or left at bedside. Due to patient's limited mobility and visual acuity, patient made her best attempt at a signature for consent. Witnessed by Celso Sickle RN, BSN CEN and Jake Samples, RN [facility]. Aforementioned nurses cosigned consent in acknowledgement of patient's mark of consent.   PICC/Midline Placement Documentation  PICC Single Lumen 69/62/95 PICC Right Basilic 35 cm 2 cm (Active)  Indication for Insertion or Continuance of Line Prolonged intravenous therapies 03/20/2017 12:00 PM  Exposed Catheter (cm) 2 cm 03/20/2017 12:00 PM  Site Assessment Clean;Dry;Intact 03/20/2017 12:00 PM  Line Status Flushed;Saline locked;Cap changed;Blood return noted 03/20/2017 12:00 PM  Dressing Type Transparent;Non-occlusive 03/20/2017 12:00 PM  Dressing Status Clean;Dry;Intact;Antimicrobial disc in place 03/20/2017 12:00 PM  Line Care Connections checked and tightened 03/20/2017 12:00 PM  Line Adjustment (NICU/IV Team Only) No 03/20/2017 12:00 PM  Dressing Intervention New dressing 03/20/2017 12:00 PM  Dressing Change Due 03/27/17 03/20/2017 12:00 PM       Bary Castilla 03/20/2017, 12:05 PM

## 2017-03-20 NOTE — Progress Notes (Addendum)
Pharmacy Antibiotic Note  Deanna Baldwin is a 42 y.o. female admitted on 03/17/2017 with sacral osteomyelitis. Per ID, plan for 8 weeks of IV antibiotics. Pharmacy has been consulted for vancomycin dosing. Patient also started on ceftriaxone and PO Flagyl per MD.  Noted patient is paraplegic with baseline SCr ~0.3- admitted with SCr 0.8, currently 0.6 with est CrCl ~60-41mL/min.  She received vancomycin 1g on 2/14 and 750mg  on 2/15, then it was stopped. A random vancomycin level today was undetectable.  Plan: Vancomycin 1000mg  IV q24h. Goal trough 15-20 mcg/mL. Ceftriaxone 2g IV q24h + Flagyl 500mg  PO TID per MD Monitor renal function closely for dose adjustments  Height: 5\' 9"  (175.3 cm) Weight: 101 lb (45.8 kg)(with 2 pillows) IBW/kg (Calculated) : 66.2  Temp (24hrs), Avg:98.1 F (36.7 C), Min:97.7 F (36.5 C), Max:98.3 F (36.8 C)  Recent Labs  Lab 03/17/17 0557 03/17/17 0609 03/17/17 0918 03/17/17 0933 03/18/17 0541 03/18/17 0742 03/19/17 0340  WBC 12.6*  --   --   --  7.5  --  7.7  CREATININE 0.82  --   --   --  0.66  --  0.61  LATICACIDVEN  --  2.05* 2.4* 2.24*  --  0.6  --     Estimated Creatinine Clearance: 67.6 mL/min (by C-G formula based on SCr of 0.61 mg/dL).    No Known Allergies  Antimicrobials this admission: Zosyn 2/14 >> 2/15 Vanc 2/14 >> 2/15; 2/17 >> CTX 2/17 >> Flagyl 2/17 >>  Dose adjustments this admission: None  Microbiology results: 2/14 BCx: NGTD 2/14 UCx: multiple species 2/14 MRSA PCR: positive  Thank you for allowing pharmacy to be a part of this patient's care.  Karder Goodin D. Hattie Aguinaldo, PharmD, BCPS Clinical Pharmacist 03/20/2017 5:35 PM

## 2017-03-20 NOTE — Progress Notes (Signed)
PROGRESS NOTE    Deanna Baldwin  YQI:[id number] DOB: 1975/08/22 DOA: 03/17/2017 PCP: [name], DO     Brief Narrative:  Deanna Baldwin is a 41 y.o. 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 In the ED, she was noted to be confused and groaning. She was admitted for sepsis, unclear etiology, started on vanco/zosyn. Wound RN recommended MR pelvis to evaluate her chronic wounds which revealed osteomyelitis.   Assessment & Plan:   Principal Problem:   Osteomyelitis ([place]) Active Problems:   Elevated liver function tests   Protein-calorie malnutrition, severe (HCC)   Palliative care by specialist   Dysphagia, oral phase   Anemia of chronic disease   Neurogenic bladder   Sepsis (HCC)   Pressure ulcer, stage 4 (HCC)   Chronic pain   Chronic constipation   Paraplegia (HCC)   SIRS (systemic inflammatory response syndrome) (HCC)   Altered mental status   Dehydration   Acute osteomyelitis of left pelvic region and thigh ([place])  Acute metabolic encephalopathy -Improving. Alert and oriented on my exam and able to answer most questions.   Sepsis secondary to sacral and left ischial osteomyelitis -MR pelvis: sacral decubitus ulcer with underlying osteomyelitis, deep decubitus ulcer over the left ischial tuberosity with osteomyelitis. Diffuse myositis without definite findings for pyomyositis or discrete drainable soft tissue abscess -General surgery consulted. Recommending hydrotherapy, no plans for surgery. General surgery does not do bone biopsy  -ID consulted. Recommended bone biopsy/wound culture for antibiotic guidance -Spoke with plastic surgery 2/15 for consult, Dr. Marla Roe would consult once patient's wounds have improved for flap consideration -Spoke with  orthopedic surgery 2/16 for bone biopsy, they do not recommend bone biopsy as it would be contaminated with open wound that patient presents with  -Blood cultures negative to date  -Due to lack of culture collection, patient will be on IV vanco/ceftriaxone/PO flagyl for 8 weeks. OPAT consult and PICC ordered  -PT for hydrotherapy   Hypernatremia -With poor PO intake -IVF D5 0.45NS  -Trend BMP. Labs pending today   Elevated liver enzymes -RUQ [place]: No acute or focal abnormality identified. No gallstones or biliary distention. Liver appears normal. -Hepatitis panel negative  -GGT 1034 -Stop tylenol  -Trend LFT. Labs pending today   Anemia of chronic disease -FOBT positive on admission, has hx of hemorrhoids. Transfused 1u pRBC on admission  -Watch for gross GI bleeding -Transfuse if hgb < 7  Multiple sclerosis with spatic paraplegia, neurogenic bladder -Supportive care  -Urostomy care  -Continue baclofen -Turn q2h   Chronic hypotension -Continue midodrine   Chronic constipation -Bowel regimen  Goals of care -Palliative care consulted  DVT prophylaxis: SCD Code Status: Full Family Communication: Spoke with sister over the phone 2/16  Disposition Plan: Pending improvement, back to SNF when stable   Consultants:   General Surgery  ID   Plastic surgery (over the phone)  Orthopedic surgery (over the phone)   Procedures:   None  Antimicrobials:  Anti-infectives (From admission, onward)   Start     Dose/Rate Route Frequency Ordered Stop   03/20/17 0815  vancomycin (VANCOCIN) 500 mg in sodium chloride 0.9 % 100 mL IVPB     500 mg 100 mL/hr over 60 Minutes Intravenous Every 8 hours 03/20/17 0804     03/20/17 0800  cefTRIAXone (ROCEPHIN) 2 g in sodium  chloride 0.9 % 100 mL IVPB     2 g 200 mL/hr over 30 Minutes Intravenous Every 24 hours 03/20/17 0741     03/20/17 0800  metroNIDAZOLE (FLAGYL) tablet 500 mg     500 mg Oral Every 8 hours 03/20/17 0741     03/18/17  0600  vancomycin (VANCOCIN) IVPB 750 mg/150 ml premix  Status:  Discontinued     750 mg 150 mL/hr over 60 Minutes Intravenous Every 24 hours 03/17/17 0753 03/18/17 1118   03/17/17 1400  piperacillin-tazobactam (ZOSYN) IVPB 3.375 g  Status:  Discontinued     3.375 g 12.5 mL/hr over 240 Minutes Intravenous Every 8 hours 03/17/17 0753 03/18/17 1118   03/17/17 0615  piperacillin-tazobactam (ZOSYN) IVPB 3.375 g     3.375 g 100 mL/hr over 30 Minutes Intravenous  Once 03/17/17 0609 03/17/17 0740   03/17/17 0615  vancomycin (VANCOCIN) IVPB 1000 mg/200 mL premix     1,000 mg 200 mL/hr over 60 Minutes Intravenous  Once 03/17/17 0609 03/17/17 0801       Subjective: No new issues. Doing well   Objective: Vitals:   03/19/17 2054 03/20/17 0500 03/20/17 0601 03/20/17 0602  BP: 104/64  (!) 89/55 (!) 93/54  Pulse:   95 94  Resp: 16  16   Temp: 97.7 F (36.5 C)  98.3 F (36.8 C)   TempSrc: Oral  Oral   SpO2: 100%  100%   Weight:  45.8 kg (101 lb)    Height:        Intake/Output Summary (Last 24 hours) at 03/20/2017 1214 Last data filed at 03/20/2017 0100 Gross per 24 hour  Intake 230 ml  Output 850 ml  Net -620 ml   Filed Weights   03/18/17 0502 03/19/17 0329 03/20/17 0500  Weight: 39 kg (86 lb) 38.1 kg (84 lb) 45.8 kg (101 lb)    Examination: General exam: Appears calm and comfortable, thin and frail  Respiratory system: Clear to auscultation. Respiratory effort normal. Cardiovascular system: S1 & S2 heard, RRR. No JVD, murmurs, rubs, gallops or clicks. No pedal edema. Gastrointestinal system: Abdomen is nondistended, soft and nontender. No organomegaly or masses felt. Normal bowel sounds heard. Central nervous system: Alert and oriented Extremities: +contractures of extremities  Psychiatry: Judgement and insight appear normal. Mood & affect appropriate.    Data Reviewed: I have personally reviewed following labs and imaging studies  CBC: Recent Labs  Lab 03/17/17 0557  03/18/17 0541 03/19/17 0340  WBC 12.6* 7.5 7.7  NEUTROABS 10.7*  --   --   HGB 6.8* 8.8* 8.1*  HCT 21.4* 27.7* 24.5*  MCV 86.3 86.6 83.9  PLT 702* 478* 696*   Basic Metabolic Panel: Recent Labs  Lab 03/17/17 0557 03/18/17 0541 03/19/17 0340  NA 148* 152* 146*  K 4.5 4.1 3.3*  CL 118* 126* 118*  CO2 13* 11* 14*  GLUCOSE 129* 102* 148*  BUN 50* 30* 18  CREATININE 0.82 0.66 0.61  CALCIUM 8.5* 8.0* 7.6*   GFR: Estimated Creatinine Clearance: 67.6 mL/min (by C-G formula based on SCr of 0.61 mg/dL). Liver Function Tests: Recent Labs  Lab 03/17/17 0557 03/18/17 0742 03/19/17 0745  AST 62* 33 103*  ALT 220* 123* 134*  ALKPHOS 1,231* 930* 1,131*  BILITOT 0.5 0.8 0.5  PROT 7.8 6.1* 5.6*  ALBUMIN 3.4* 2.5* 2.4*   Recent Labs  Lab 03/17/17 0918  LIPASE 128*   No results for input(s): AMMONIA in the last 168 hours. Coagulation Profile: Recent Labs  Lab 03/17/17 0918  INR 1.28   Cardiac Enzymes: No results for input(s): CKTOTAL, CKMB, CKMBINDEX, TROPONINI in the last 168 hours. BNP (last 3 results) No results for input(s): PROBNP in the last 8760 hours. HbA1C: No results for input(s): HGBA1C in the last 72 hours. CBG: No results for input(s): GLUCAP in the last 168 hours. Lipid Profile: No results for input(s): CHOL, HDL, LDLCALC, TRIG, CHOLHDL, LDLDIRECT in the last 72 hours. Thyroid Function Tests: No results for input(s): TSH, T4TOTAL, FREET4, T3FREE, THYROIDAB in the last 72 hours. Anemia Panel: No results for input(s): VITAMINB12, FOLATE, FERRITIN, TIBC, IRON, RETICCTPCT in the last 72 hours. Sepsis Labs: Recent Labs  Lab 03/17/17 0609 03/17/17 0918 03/17/17 0933 03/18/17 0742  LATICACIDVEN 2.05* 2.4* 2.24* 0.6    Recent Results (from the past 240 hour(s))  Urine culture     Status: Abnormal   Collection Time: 03/17/17  5:40 AM  Result Value Ref Range Status   Specimen Description URINE, RANDOM  Final   Special Requests   Final     NONE Performed at [facility], [place] [address], [place], [place] [address]    Culture MULTIPLE SPECIES PRESENT, SUGGEST RECOLLECTION (A)  Final   Report Status 03/18/2017 FINAL  Final  Blood Culture (routine x 2)     Status: None (Preliminary result)   Collection Time: 03/17/17  8:19 AM  Result Value Ref Range Status   Specimen Description BLOOD LEFT FOREARM  Final   Special Requests IN PEDIATRIC BOTTLE Blood Culture adequate volume  Final   Culture   Final    NO GROWTH 2 DAYS Performed at [facility], [place] [address] [place], [place] [address]    Report Status PENDING  Incomplete  Blood Culture (routine x 2)     Status: None (Preliminary result)   Collection Time: 03/17/17  8:20 AM  Result Value Ref Range Status   Specimen Description BLOOD LEFT FEMORAL ARTERY  Final   Special Requests IN PEDIATRIC BOTTLE Blood Culture adequate volume  Final   Culture   Final    NO GROWTH 2 DAYS Performed at [facility], [place] [address], [place], [place] [address]    Report Status PENDING  Incomplete  MRSA PCR Screening     Status: Abnormal   Collection Time: 03/17/17  6:36 PM  Result Value Ref Range Status   MRSA by PCR POSITIVE (A) NEGATIVE Final    Comment:        The GeneXpert MRSA Assay (FDA approved for NASAL specimens only), is one component of a comprehensive MRSA colonization surveillance program. It is not intended to diagnose MRSA infection nor to guide or monitor treatment for MRSA infections. RESULT CALLED TO, READ BACK BY AND VERIFIED WITH: A..POTEAT,RN AT 2255 BY L.PITT 03/17/17        Radiology Studies: No results found.    Scheduled Meds: . baclofen  20 mg Oral QID  . bisacodyl  10 mg Oral Daily  . Chlorhexidine Gluconate Cloth  6 each Topical Q0600  . collagenase   Topical Daily  . docusate sodium  100 mg Oral BID  . famotidine  20 mg Oral BH-q7a  . feeding supplement (ENSURE ENLIVE)  237 mL Oral TID BM  . feeding supplement (PRO-STAT  SUGAR FREE 64)  30 mL Oral BID  . ibuprofen  200 mg Oral TID  . metroNIDAZOLE  500 mg Oral Q8H  . midodrine  2.5 mg Oral TID WC  .  mupirocin ointment  1 application Nasal BID  . polyethylene glycol  17 g Oral BID  . sodium chloride flush  10-40 mL Intracatheter Q12H   Continuous Infusions: . cefTRIAXone (ROCEPHIN)  IV 2 g (03/20/17 0812)  . dextrose 5 % and 0.45% NaCl 100 mL/hr at 03/19/17 1731  . vancomycin       LOS: 3 days    Time spent: 30 minutes   Dessa Phi, DO [facility] [email or web address] Password [facility] 03/20/2017, 12:14 PM

## 2017-03-21 LAB — CBC
HEMATOCRIT: 24.2 % — AB (ref 36.0–46.0)
Hemoglobin: 7.9 g/dL — ABNORMAL LOW (ref 12.0–15.0)
MCH: 27.5 pg (ref 26.0–34.0)
MCHC: 32.6 g/dL (ref 30.0–36.0)
MCV: 84.3 fL (ref 78.0–100.0)
Platelets: 482 10*3/uL — ABNORMAL HIGH (ref 150–400)
RBC: 2.87 MIL/uL — ABNORMAL LOW (ref 3.87–5.11)
RDW: 19.7 % — AB (ref 11.5–15.5)
WBC: 8.6 10*3/uL (ref 4.0–10.5)

## 2017-03-21 LAB — COMPREHENSIVE METABOLIC PANEL
ALBUMIN: 2.4 g/dL — AB (ref 3.5–5.0)
ALT: 117 U/L — ABNORMAL HIGH (ref 14–54)
ANION GAP: 11 (ref 5–15)
AST: 81 U/L — ABNORMAL HIGH (ref 15–41)
Alkaline Phosphatase: 1093 U/L — ABNORMAL HIGH (ref 38–126)
BILIRUBIN TOTAL: 0.3 mg/dL (ref 0.3–1.2)
BUN: 20 mg/dL (ref 6–20)
CO2: 21 mmol/L — ABNORMAL LOW (ref 22–32)
Calcium: 7.5 mg/dL — ABNORMAL LOW (ref 8.9–10.3)
Chloride: 111 mmol/L (ref 101–111)
Creatinine, Ser: 0.37 mg/dL — ABNORMAL LOW (ref 0.44–1.00)
GFR calc non Af Amer: >60 mL/min (ref >60)
Glucose, Bld: 128 mg/dL — ABNORMAL HIGH (ref 65–99)
POTASSIUM: 3.6 mmol/L (ref 3.5–5.1)
Sodium: 143 mmol/L (ref 135–145)
TOTAL PROTEIN: 5.6 g/dL — AB (ref 6.5–8.1)

## 2017-03-21 NOTE — Progress Notes (Signed)
Awaiting SNF decision if they are able to accept patient back on IV Vanc and Rocephin.   Percell Locus Alazne Quant LCSW [phone]

## 2017-03-21 NOTE — Progress Notes (Signed)
[facility] Progress Note     Subjective: CC-  No complaints. Slept well last night. Had first session of hydrotherapy over the weekend. Tolerating dressing changes well.  Objective: Vital signs in last 24 hours: Temp:  [98.2 F (36.8 C)-98.8 F (37.1 C)] 98.5 F (36.9 C) (02/18 0702) Pulse Rate:  [101-106] 101 (02/18 0702) Resp:  [24] 24 (02/17 1343) BP: (89-92)/(52-66) 92/66 (02/18 0815) SpO2:  [98 %-100 %] 99 % (02/18 0702) Weight:  [46.3 kg (102 lb)] 46.3 kg (102 lb) (02/18 0700) Last BM Date: 03/21/17  Intake/Output from previous day: 02/17 0701 - 02/18 0700 In: 1401.7 [P.O.:340; I.V.:1061.7] Out: 1050 [Urine:1050] Intake/Output this shift: No intake/output data recorded.  PE: Gen:  Alert, NAD, pleasant HEENT: EOM's intact, pupils equal and round Pulm:  effort normal GU: Sacral:   Left ischial:   Right ischial:     Lab Results:  Recent Labs    03/20/17 1601 03/21/17 0355  WBC 8.8 8.6  HGB 8.1* 7.9*  HCT 25.0* 24.2*  PLT 493* 482*   BMET Recent Labs    03/20/17 1601 03/21/17 0355  NA 144 143  K 3.8 3.6  CL 113* 111  CO2 20* 21*  GLUCOSE 101* 128*  BUN 20 20  CREATININE 0.40* 0.37*  CALCIUM 7.5* 7.5*   PT/INR No results for input(s): LABPROT, INR in the last 72 hours. CMP     Component Value Date/Time   NA 143 03/21/2017 0355   NA 139 01/19/2017 1357   K 3.6 03/21/2017 0355   CL 111 03/21/2017 0355   CO2 21 (L) 03/21/2017 0355   GLUCOSE 128 (H) 03/21/2017 0355   BUN 20 03/21/2017 0355   BUN 19 01/19/2017 1357   CREATININE 0.37 (L) 03/21/2017 0355   CALCIUM 7.5 (L) 03/21/2017 0355   PROT 5.6 (L) 03/21/2017 0355   PROT 7.4 01/19/2017 1357   ALBUMIN 2.4 (L) 03/21/2017 0355   ALBUMIN 4.1 01/19/2017 1357   AST 81 (H) 03/21/2017 0355   ALT 117 (H) 03/21/2017 0355   ALKPHOS 1,093 (H) 03/21/2017 0355   BILITOT 0.3 03/21/2017 0355   BILITOT <0.2 01/19/2017 1357   GFRNONAA >60 03/21/2017 0355   GFRAA >60 03/21/2017  0355   Lipase     Component Value Date/Time   LIPASE 128 (H) 03/17/2017 3329       Studies/Results: No results found.  Anti-infectives: Anti-infectives (From admission, onward)   Start     Dose/Rate Route Frequency Ordered Stop   03/20/17 1800  vancomycin (VANCOCIN) IVPB 1000 mg/200 mL premix     1,000 mg 200 mL/hr over 60 Minutes Intravenous Every 24 hours 03/20/17 1737     03/20/17 0815  vancomycin (VANCOCIN) 500 mg in sodium chloride 0.9 % 100 mL IVPB  Status:  Discontinued     500 mg 100 mL/hr over 60 Minutes Intravenous Every 8 hours 03/20/17 0804 03/20/17 1330   03/20/17 0800  cefTRIAXone (ROCEPHIN) 2 g in sodium chloride 0.9 % 100 mL IVPB     2 g 200 mL/hr over 30 Minutes Intravenous Every 24 hours 03/20/17 0741     03/20/17 0800  metroNIDAZOLE (FLAGYL) tablet 500 mg     500 mg Oral Every 8 hours 03/20/17 0741     03/18/17 0600  vancomycin (VANCOCIN) IVPB 750 mg/150 ml premix  Status:  Discontinued     750 mg 150 mL/hr over 60 Minutes Intravenous Every 24 hours 03/17/17 0753 03/18/17 1118   03/17/17 1400  piperacillin-tazobactam (ZOSYN)  IVPB 3.375 g  Status:  Discontinued     3.375 g 12.5 mL/hr over 240 Minutes Intravenous Every 8 hours 03/17/17 0753 03/18/17 1118   03/17/17 0615  piperacillin-tazobactam (ZOSYN) IVPB 3.375 g     3.375 g 100 mL/hr over 30 Minutes Intravenous  Once 03/17/17 0609 03/17/17 0740   03/17/17 0615  vancomycin (VANCOCIN) IVPB 1000 mg/200 mL premix     1,000 mg 200 mL/hr over 60 Minutes Intravenous  Once 03/17/17 0938 03/17/17 0801       Assessment/Plan Multiple sclerosis with spastic paraplegia Neurogenic bladder Chronic hypotension Chronic constipation Sepsis 2/2 sacral and ischial wounds  Sacral and b/l ischial decubs with underlying osteomyelitis - MRI showed underlying osteomyelitis, Diffuse myositis without definite findings for pyomyositis or discrete drainable soft tissue abscess - daily wound care as per [facility] RN   ID -  rocephin 2/17>>, flagyl 2/17>>, vancomycin 2/14>>, zosyn 2/14>>2/15 FEN - dysphagia 2 VTE - per primary Foley - urostomy Follow up - TBD  Plan - Continue hydrotherapy. No need for surgical debridement at this time. Wound care per [place] RN.   LOS: 4 days    Wellington [place] , [facility] 03/21/2017, 9:09 AM Pager: [phone] Consults: [phone] Mon-Fri 7:00 am-4:30 pm Sat-Sun 7:00 am-11:30 am

## 2017-03-21 NOTE — Progress Notes (Signed)
PROGRESS NOTE    Deanna Baldwin  SWF:[id number] DOB: 01-23-1976 DOA: 03/17/2017 PCP: [name], DO     Brief Narrative:  Deanna Baldwin is a 42 y.o. 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 In the ED, she was noted to be confused and groaning. She was admitted for sepsis, unclear etiology, started on vanco/zosyn. Wound RN recommended MR pelvis to evaluate her chronic wounds which revealed osteomyelitis.   Assessment & Plan:   Principal Problem:   Osteomyelitis ([place]) Active Problems:   Elevated liver function tests   Protein-calorie malnutrition, severe (HCC)   Palliative care by specialist   Dysphagia, oral phase   Anemia of chronic disease   Neurogenic bladder   Sepsis (HCC)   Pressure ulcer, stage 4 (HCC)   Chronic pain   Chronic constipation   Paraplegia (HCC)   SIRS (systemic inflammatory response syndrome) (HCC)   Altered mental status   Dehydration   Acute osteomyelitis of left pelvic region and thigh ([place])  Acute metabolic encephalopathy -Resolved to baseline. Alert and oriented on my exam and able to answer questions appropriately.   Sepsis secondary to sacral and left ischial osteomyelitis -MR pelvis: sacral decubitus ulcer with underlying osteomyelitis, deep decubitus ulcer over the left ischial tuberosity with osteomyelitis. Diffuse myositis without definite findings for pyomyositis or discrete drainable soft tissue abscess -General surgery consulted. Recommending hydrotherapy, no plans for surgery. General surgery does not do bone biopsy  -ID consulted. Recommended bone biopsy/wound culture for antibiotic guidance -Spoke with plastic surgery 2/15 for consult, Dr. Marla Roe would consult once patient's wounds have improved for flap  consideration -Spoke with orthopedic surgery 2/16 for bone biopsy, they do not recommend bone biopsy as it would be contaminated with open wound that patient presents with  -Blood cultures negative to date  -Due to lack of culture collection, patient will be on IV vanco/ceftriaxone/PO flagyl for 8 weeks. PICC placed  -PT for hydrotherapy   Hypernatremia -With poor PO intake -Resolved   Elevated liver enzymes -RUQ [place]: No acute or focal abnormality identified. No gallstones or biliary distention. Liver appears normal. -Hepatitis panel negative  -GGT 1034 -LFT has been stable. Note that LFT has been elevated as outpatient and will need further investigation as outpatient. Stop tylenol   Anemia of chronic disease -FOBT positive on admission, has hx of hemorrhoids. Transfused 1u pRBC on admission  -Watch for gross GI bleeding -Transfuse if hgb < 7 -Hgb stable without bleeding   Multiple sclerosis with spatic paraplegia, neurogenic bladder -Supportive care  -Urostomy care  -Continue baclofen -Turn q2h   Chronic hypotension -Continue midodrine   Chronic constipation -Bowel regimen  Goals of care -Palliative care consulted  DVT prophylaxis: SCD Code Status: Full Family Communication: Left voicemail for sister 2/18  Disposition Plan: Pending improvement, back to SNF when stable   Consultants:   General Surgery  ID   Plastic surgery (over the phone)  Orthopedic surgery (over the phone)   Procedures:   None  Antimicrobials:  Anti-infectives (From admission, onward)   Start     Dose/Rate Route Frequency Ordered Stop   03/20/17 1800  vancomycin (VANCOCIN) IVPB 1000 mg/200 mL premix     1,000 mg 200 mL/hr over 60 Minutes Intravenous Every 24 hours 03/20/17 1737     03/20/17 0815  vancomycin (VANCOCIN) 500 mg  in sodium chloride 0.9 % 100 mL IVPB  Status:  Discontinued     500 mg 100 mL/hr over 60 Minutes Intravenous Every 8 hours 03/20/17 0804 03/20/17 1330    03/20/17 0800  cefTRIAXone (ROCEPHIN) 2 g in sodium chloride 0.9 % 100 mL IVPB     2 g 200 mL/hr over 30 Minutes Intravenous Every 24 hours 03/20/17 0741     03/20/17 0800  metroNIDAZOLE (FLAGYL) tablet 500 mg     500 mg Oral Every 8 hours 03/20/17 0741     03/18/17 0600  vancomycin (VANCOCIN) IVPB 750 mg/150 ml premix  Status:  Discontinued     750 mg 150 mL/hr over 60 Minutes Intravenous Every 24 hours 03/17/17 0753 03/18/17 1118   03/17/17 1400  piperacillin-tazobactam (ZOSYN) IVPB 3.375 g  Status:  Discontinued     3.375 g 12.5 mL/hr over 240 Minutes Intravenous Every 8 hours 03/17/17 0753 03/18/17 1118   03/17/17 0615  piperacillin-tazobactam (ZOSYN) IVPB 3.375 g     3.375 g 100 mL/hr over 30 Minutes Intravenous  Once 03/17/17 0609 03/17/17 0740   03/17/17 0615  vancomycin (VANCOCIN) IVPB 1000 mg/200 mL premix     1,000 mg 200 mL/hr over 60 Minutes Intravenous  Once 03/17/17 0609 03/17/17 0801       Subjective: No complaints, feeling well today and slept well   Objective: Vitals:   03/20/17 2332 03/21/17 0700 03/21/17 0702 03/21/17 0815  BP: (!) 91/56  (!) 89/59 92/66  Pulse: (!) 101  (!) 101   Resp:      Temp: 98.2 F (36.8 C)  98.5 F (36.9 C)   TempSrc: Oral  Oral   SpO2: 98%  99%   Weight:  46.3 kg (102 lb)    Height:        Intake/Output Summary (Last 24 hours) at 03/21/2017 1132 Last data filed at 03/21/2017 0930 Gross per 24 hour  Intake 1621.67 ml  Output 1400 ml  Net 221.67 ml   Filed Weights   03/19/17 0329 03/20/17 0500 03/21/17 0700  Weight: 38.1 kg (84 lb) 45.8 kg (101 lb) 46.3 kg (102 lb)    Examination: General exam: Appears calm and comfortable, thin and frail  Respiratory system: Clear to auscultation. Respiratory effort normal. Cardiovascular system: S1 & S2 heard, RRR. No JVD, murmurs, rubs, gallops or clicks. No pedal edema. Gastrointestinal system: Abdomen is nondistended, soft and nontender. No organomegaly or masses felt. Normal bowel  sounds heard. Central nervous system: Alert and oriented. Extremities: +LE contractures  Skin: +bilateral ischial and sacral wounds stage 3-4, clean  Psychiatry: Judgement and insight appear normal. Mood & affect appropriate.    Data Reviewed: I have personally reviewed following labs and imaging studies  CBC: Recent Labs  Lab 03/17/17 0557 03/18/17 0541 03/19/17 0340 03/20/17 1601 03/21/17 0355  WBC 12.6* 7.5 7.7 8.8 8.6  NEUTROABS 10.7*  --   --   --   --   HGB 6.8* 8.8* 8.1* 8.1* 7.9*  HCT 21.4* 27.7* 24.5* 25.0* 24.2*  MCV 86.3 86.6 83.9 84.5 84.3  PLT 702* 478* 479* 493* 956*   Basic Metabolic Panel: Recent Labs  Lab 03/17/17 0557 03/18/17 0541 03/19/17 0340 03/20/17 1601 03/21/17 0355  NA 148* 152* 146* 144 143  K 4.5 4.1 3.3* 3.8 3.6  CL 118* 126* 118* 113* 111  CO2 13* 11* 14* 20* 21*  GLUCOSE 129* 102* 148* 101* 128*  BUN 50* 30* 18 20 20   CREATININE 0.82 0.66 0.61  0.40* 0.37*  CALCIUM 8.5* 8.0* 7.6* 7.5* 7.5*   GFR: Estimated Creatinine Clearance: 68.3 mL/min (A) (by C-G formula based on SCr of 0.37 mg/dL (L)). Liver Function Tests: Recent Labs  Lab 03/17/17 0557 03/18/17 0742 03/19/17 0745 03/20/17 1601 03/21/17 0355  AST 62* 33 103* 93* 81*  ALT 220* 123* 134* 125* 117*  ALKPHOS 1,231* 930* 1,131* 1,066* 1,093*  BILITOT 0.5 0.8 0.5 0.6 0.3  PROT 7.8 6.1* 5.6* 5.6* 5.6*  ALBUMIN 3.4* 2.5* 2.4* 2.3* 2.4*   Recent Labs  Lab 03/17/17 0918  LIPASE 128*   No results for input(s): AMMONIA in the last 168 hours. Coagulation Profile: Recent Labs  Lab 03/17/17 0918  INR 1.28   Cardiac Enzymes: No results for input(s): CKTOTAL, CKMB, CKMBINDEX, TROPONINI in the last 168 hours. BNP (last 3 results) No results for input(s): PROBNP in the last 8760 hours. HbA1C: No results for input(s): HGBA1C in the last 72 hours. CBG: No results for input(s): GLUCAP in the last 168 hours. Lipid Profile: No results for input(s): CHOL, HDL, LDLCALC, TRIG,  CHOLHDL, LDLDIRECT in the last 72 hours. Thyroid Function Tests: No results for input(s): TSH, T4TOTAL, FREET4, T3FREE, THYROIDAB in the last 72 hours. Anemia Panel: No results for input(s): VITAMINB12, FOLATE, FERRITIN, TIBC, IRON, RETICCTPCT in the last 72 hours. Sepsis Labs: Recent Labs  Lab 03/17/17 0609 03/17/17 0918 03/17/17 0933 03/18/17 0742  LATICACIDVEN 2.05* 2.4* 2.24* 0.6    Recent Results (from the past 240 hour(s))  Urine culture     Status: Abnormal   Collection Time: 03/17/17  5:40 AM  Result Value Ref Range Status   Specimen Description URINE, RANDOM  Final   Special Requests   Final    NONE Performed at [facility], [place] [address] [place], [place] [address]    Culture MULTIPLE SPECIES PRESENT, SUGGEST RECOLLECTION (A)  Final   Report Status 03/18/2017 FINAL  Final  Blood Culture (routine x 2)     Status: None (Preliminary result)   Collection Time: 03/17/17  8:19 AM  Result Value Ref Range Status   Specimen Description BLOOD LEFT FOREARM  Final   Special Requests IN PEDIATRIC BOTTLE Blood Culture adequate volume  Final   Culture   Final    NO GROWTH 3 DAYS Performed at [facility], [place] [address], [place], [place] [address]    Report Status PENDING  Incomplete  Blood Culture (routine x 2)     Status: None (Preliminary result)   Collection Time: 03/17/17  8:20 AM  Result Value Ref Range Status   Specimen Description BLOOD LEFT FEMORAL ARTERY  Final   Special Requests IN PEDIATRIC BOTTLE Blood Culture adequate volume  Final   Culture   Final    NO GROWTH 3 DAYS Performed at [facility], [place] [address] [place], [place] [address]    Report Status PENDING  Incomplete  MRSA PCR Screening     Status: Abnormal   Collection Time: 03/17/17  6:36 PM  Result Value Ref Range Status   MRSA by PCR POSITIVE (A) NEGATIVE Final    Comment:        The GeneXpert MRSA Assay (FDA approved for NASAL specimens only), is one component of  a comprehensive MRSA colonization surveillance program. It is not intended to diagnose MRSA infection nor to guide or monitor treatment for MRSA infections. RESULT CALLED TO, READ BACK BY AND VERIFIED WITH: A..POTEAT,RN AT 2255 BY L.PITT 03/17/17  Radiology Studies: No results found.    Scheduled Meds: . baclofen  20 mg Oral QID  . bisacodyl  10 mg Oral Daily  . Chlorhexidine Gluconate Cloth  6 each Topical Q0600  . collagenase   Topical Daily  . docusate sodium  100 mg Oral BID  . famotidine  20 mg Oral BH-q7a  . feeding supplement (ENSURE ENLIVE)  237 mL Oral TID BM  . feeding supplement (PRO-STAT SUGAR FREE 64)  30 mL Oral BID  . ibuprofen  200 mg Oral TID  . metroNIDAZOLE  500 mg Oral Q8H  . midodrine  2.5 mg Oral TID WC  . mupirocin ointment  1 application Nasal BID  . polyethylene glycol  17 g Oral BID  . sodium chloride flush  10-40 mL Intracatheter Q12H   Continuous Infusions: . cefTRIAXone (ROCEPHIN)  IV Stopped (03/21/17 0902)  . dextrose 5 % and 0.45% NaCl 100 mL/hr at 03/21/17 0425  . vancomycin Stopped (03/20/17 1928)     LOS: 4 days    Time spent: 30 minutes   Dessa Phi, DO [facility] [email or web address] Password [facility] 03/21/2017, 11:32 AM

## 2017-03-21 NOTE — Progress Notes (Signed)
Physical Therapy Wound Treatment Patient Details  Name: Deanna Baldwin MRN: [id number] Date of Birth: 1975/05/21  Today's Date: 03/21/2017 Time: 6948-5462 Time Calculation (min): 34 min  Subjective  Subjective: Pt asked how wounds looked today  Pain Score: Pain Score: Faces 4/10 Grimacing with hydrotherapy of sacral and ischial wounds Wound Assessment  Pressure Injury 03/17/17 Stage IV - Full thickness tissue loss with exposed bone, tendon or muscle. Sacrum (Active)  Dressing Type Barrier Film (skin prep);Foam;Gauze (Comment);Moist to dry 03/21/2017 11:00 AM  Dressing Changed;Clean;Dry;Intact 03/21/2017 11:00 AM  Dressing Change Frequency Daily 03/21/2017 11:00 AM  State of Healing Non-healing 03/21/2017 11:00 AM  Site / Wound Assessment Pale;Pink;Red;Yellow 03/21/2017 11:00 AM  % Wound base Red or Granulating 80% 03/21/2017 11:00 AM  % Wound base Yellow/Fibrinous Exudate 20% 03/21/2017 11:00 AM  % Wound base Black/Eschar 0% 03/21/2017 11:00 AM  % Wound base Other/Granulation Tissue (Comment) 0% 03/21/2017 11:00 AM  Peri-wound Assessment Pink 03/21/2017 11:00 AM  Wound Length (cm) 5 cm 03/19/2017  4:00 PM  Wound Width (cm) 5 cm 03/19/2017  4:00 PM  Wound Depth (cm) 0.1 cm 03/19/2017  4:00 PM  Wound Surface Area (cm^2) 25 cm^2 03/19/2017  4:00 PM  Wound Volume (cm^3) 2.5 cm^3 03/19/2017  4:00 PM  Undermining (cm) .5 cm from 9 to 12 oclock 03/19/2017  4:00 PM  Margins Unattached edges (unapproximated) 03/21/2017 11:00 AM  Drainage Amount Moderate 03/21/2017 11:00 AM  Drainage Description Purulent 03/21/2017 11:00 AM  Treatment Hydrotherapy (Pulse lavage);Packing (Saline gauze) 03/21/2017 11:00 AM   Santyl applied to wound bed prior to application of dressing  Pressure Injury 03/17/17 Stage III -  Full thickness tissue loss. Subcutaneous fat may be visible but bone, tendon or muscle are NOT exposed. rt ischial tuberosity (Active)  Dressing Type Barrier Film (skin prep);Foam;Silver hydrofiber 03/21/2017  11:00 AM  Dressing Changed;Clean;Dry;Intact 03/21/2017 11:00 AM  Dressing Change Frequency Daily 03/21/2017 11:00 AM  State of Healing Non-healing 03/21/2017 11:00 AM  Site / Wound Assessment Pale;Pink;Red;Yellow 03/21/2017 11:00 AM  % Wound base Red or Granulating 90% 03/21/2017 11:00 AM  % Wound base Yellow/Fibrinous Exudate 10% 03/21/2017 11:00 AM  % Wound base Black/Eschar 0% 03/21/2017 11:00 AM  % Wound base Other/Granulation Tissue (Comment) 0% 03/21/2017 11:00 AM  Peri-wound Assessment Intact 03/21/2017 11:00 AM  Wound Length (cm) 5 cm 03/19/2017  4:00 PM  Wound Width (cm) 4 cm 03/19/2017  4:00 PM  Wound Depth (cm) 0.2 cm 03/19/2017  4:00 PM  Wound Surface Area (cm^2) 20 cm^2 03/19/2017  4:00 PM  Wound Volume (cm^3) 4 cm^3 03/19/2017  4:00 PM  Margins Unattached edges (unapproximated) 03/21/2017 11:00 AM  Drainage Amount Moderate 03/21/2017 11:00 AM  Drainage Description Purulent 03/21/2017 11:00 AM  Treatment Hydrotherapy (Pulse lavage);Packing (Dry gauze) 03/21/2017 11:00 AM     Pressure Injury 03/17/17 Stage III -  Full thickness tissue loss. Subcutaneous fat may be visible but bone, tendon or muscle are NOT exposed. Left ischial tuberosity (Active)  Dressing Type Silver hydrofiber;Foam;Barrier Film (skin prep) 03/21/2017 11:00 AM  Dressing Changed;Clean;Dry;Intact 03/21/2017 11:00 AM  Dressing Change Frequency Daily 03/21/2017 11:00 AM  State of Healing Non-healing 03/21/2017 11:00 AM  Site / Wound Assessment Pale;Pink;Red;Yellow 03/21/2017 11:00 AM  % Wound base Red or Granulating 80% 03/21/2017 11:00 AM  % Wound base Yellow/Fibrinous Exudate 20% 03/21/2017 11:00 AM  % Wound base Black/Eschar 0% 03/21/2017 11:00 AM  % Wound base Other/Granulation Tissue (Comment) 0% 03/21/2017 11:00 AM  Peri-wound Assessment Denuded 03/21/2017 11:00 AM  Wound Length (cm) 3.5 cm 03/19/2017  4:00 PM  Wound Width (cm) 5 cm 03/19/2017  4:00 PM  Wound Depth (cm) 0.5 cm 03/19/2017  4:00 PM  Wound Surface Area (cm^2) 17.5  cm^2 03/19/2017  4:00 PM  Wound Volume (cm^3) 8.75 cm^3 03/19/2017  4:00 PM  Undermining (cm) .5-1.5 from 9 to 1 oclock 03/19/2017  4:00 PM  Margins Unattached edges (unapproximated) 03/21/2017 11:00 AM  Drainage Amount Moderate 03/21/2017 11:00 AM  Drainage Description Purulent 03/21/2017 11:00 AM  Treatment Hydrotherapy (Pulse lavage);Packing (Dry gauze) 03/21/2017 11:00 AM   Hydrotherapy Pulsed lavage therapy - wound location: sacrum, rt ischial tuberosity, lt ischial tuberosity Pulsed Lavage with Suction (psi): 8 psi(4-8) Pulsed Lavage with Suction - Normal Saline Used: 1000 mL Pulsed Lavage Tip: Tip with splash shield   Wound Assessment and Plan  Wound Therapy - Assess/Plan/Recommendations Wound Therapy - Clinical Statement: Wound bed found to be very clean, and post pulse lavage required no selective debridement. Physician in room as dressings removed and agreed that hydrotherapy could be discontinued with nursing to continue regular dressing changes to achieve wound care goals here and with d/c back to her SNF. PT text paged surgical PA of rehab intent to d/c from hydrotherapy at this time. Review to notes from pt stay in 2018 showed that sacral wound had not changed significantly since that admission.  PT is currently signing off.  Wound Therapy - Functional Problem List: Decr mobility and sitting due to severe LE contractures, wounds, weakness. Factors Delaying/Impairing Wound Healing: Altered sensation;Immobility;Multiple medical problems;Polypharmacy Hydrotherapy Plan: Other (comment)(discontinue hydrotherapy, dressing change by nursing ) Wound Therapy - Frequency: Other (comment)(discontinue) Wound Therapy - Follow Up Recommendations: Skilled nursing facility Wound Plan: See above  Wound Therapy Goals- Improve the function of patient's integumentary system by progressing the wound(s) through the phases of wound healing (inflammation - proliferation - remodeling) by: Decrease Necrotic  Tissue to: 5 Decrease Necrotic Tissue - Progress: Progressing toward goal(MD agrees hydrotherapy therapy not required to reach goals) Increase Granulation Tissue to: 95 Increase Granulation Tissue - Progress: Progressing toward goal(MD agrees hydrotherapy therapy not required to reach goals) Goals/treatment plan/discharge plan were made with and agreed upon by patient/family: Yes Time For Goal Achievement: 7 days Wound Therapy - Potential for Goals: Good  Goals will be updated until maximal potential achieved or discharge criteria met.  Discharge criteria: when goals achieved, discharge from hospital, MD decision/surgical intervention, no progress towards goals, refusal/missing three consecutive treatments without notification or medical reason.  GP   Dani Gobble. Migdalia Dk PT, DPT Acute Rehabilitation  [phone] Pager [phone]    [place] 03/21/2017, 11:51 AM

## 2017-03-21 NOTE — Progress Notes (Signed)
PHARMACY CONSULT NOTE FOR:  OUTPATIENT  PARENTERAL ANTIBIOTIC THERAPY (OPAT)  Indication: sacral osteomyelitis Regimen: ceftriaxone 2 g IV q24h + vancomycin 1000 mg IV q24h End date: 05/14/17  Please check vancomycin trough prior to dose on 03/23/17.  IV antibiotic discharge orders are pended. To discharging provider:  please sign these orders via discharge navigator,  Select New Orders & click on the button choice - Manage This Unsigned Work.     Thank you for allowing pharmacy to be a part of this patient's care.  Renold Genta, PharmD, BCPS Clinical Pharmacist Phone for today - [place] - [phone] 03/21/2017 5:56 PM

## 2017-03-21 NOTE — Progress Notes (Signed)
  Speech Language Pathology Treatment: Dysphagia  Patient Details Name: Deanna Baldwin MRN: [id number] DOB: 11-Sep-1975 Today's Date: 03/21/2017 Time: 7588-3254 SLP Time Calculation (min) (ACUTE ONLY): 24 min  Assessment / Plan / Recommendation Clinical Impression  Pt is unhappy with current chopped diet, reiterating that she is on a regular diet and thin liquids PTA. She denies having any repeat testing since prior MBS a year ago, but she has been on chopped solids and thin liquids this admission without clinical indicators of intolerance - she remains afebrile, her breath sounds are unchanged, and no overt signs of aspiration are noted even with challenging. She does become easily distracted and wants to talk through intake, requiting Min-Mod cues from SLP to fully clear solid boluses from her mouth. Recommend advancement to Dys 3 diet, continuing thin liquids, reducing environmental distractions during meals. SLP will continue to follow acutely.   HPI HPI: 42 year old female admitted 03/17/17 with abdominal pain and AMS. PMH: chronic spastic paraplegia due to MS, UTI. Pt had MBS in February 2018 recommending Dys 3 diet and honey thick liquids due to silent aspiration.       SLP Plan  Continue with current plan of care       Recommendations  Diet recommendations: Dysphagia 3 (mechanical soft);Thin liquid Liquids provided via: Cup;Straw Medication Administration: Whole meds with liquid Supervision: Staff to assist with self feeding;Intermittent supervision to cue for compensatory strategies Compensations: Minimize environmental distractions;Slow rate;Small sips/bites Postural Changes and/or Swallow Maneuvers: Seated upright 90 degrees                Oral Care Recommendations: Oral care QID Follow up Recommendations: 24 hour supervision/assistance SLP Visit Diagnosis: Dysphagia, unspecified (R13.10) Plan: Continue with current plan of care       GO                 [name] 03/21/2017, 2:47 PM  [name], M.A. CCC-SLP [phone]

## 2017-03-22 DIAGNOSIS — Z87440 Personal history of urinary (tract) infections: Secondary | ICD-10-CM | POA: Diagnosis not present

## 2017-03-22 DIAGNOSIS — R1312 Dysphagia, oropharyngeal phase: Secondary | ICD-10-CM | POA: Diagnosis present

## 2017-03-22 DIAGNOSIS — R6520 Severe sepsis without septic shock: Secondary | ICD-10-CM | POA: Diagnosis present

## 2017-03-22 DIAGNOSIS — M8669 Other chronic osteomyelitis, multiple sites: Secondary | ICD-10-CM | POA: Diagnosis not present

## 2017-03-22 DIAGNOSIS — I9589 Other hypotension: Secondary | ICD-10-CM | POA: Diagnosis not present

## 2017-03-22 DIAGNOSIS — G8220 Paraplegia, unspecified: Secondary | ICD-10-CM | POA: Diagnosis not present

## 2017-03-22 DIAGNOSIS — R64 Cachexia: Secondary | ICD-10-CM | POA: Diagnosis not present

## 2017-03-22 DIAGNOSIS — Z87891 Personal history of nicotine dependence: Secondary | ICD-10-CM | POA: Diagnosis not present

## 2017-03-22 DIAGNOSIS — E860 Dehydration: Secondary | ICD-10-CM | POA: Diagnosis not present

## 2017-03-22 DIAGNOSIS — K5909 Other constipation: Secondary | ICD-10-CM | POA: Diagnosis not present

## 2017-03-22 DIAGNOSIS — E43 Unspecified severe protein-calorie malnutrition: Secondary | ICD-10-CM | POA: Diagnosis present

## 2017-03-22 DIAGNOSIS — G35 Multiple sclerosis: Secondary | ICD-10-CM | POA: Diagnosis present

## 2017-03-22 DIAGNOSIS — D649 Anemia, unspecified: Secondary | ICD-10-CM | POA: Diagnosis not present

## 2017-03-22 DIAGNOSIS — M8618 Other acute osteomyelitis, other site: Secondary | ICD-10-CM | POA: Diagnosis not present

## 2017-03-22 DIAGNOSIS — B999 Unspecified infectious disease: Secondary | ICD-10-CM | POA: Diagnosis not present

## 2017-03-22 DIAGNOSIS — Z79899 Other long term (current) drug therapy: Secondary | ICD-10-CM | POA: Diagnosis not present

## 2017-03-22 DIAGNOSIS — E538 Deficiency of other specified B group vitamins: Secondary | ICD-10-CM | POA: Diagnosis not present

## 2017-03-22 DIAGNOSIS — L89154 Pressure ulcer of sacral region, stage 4: Secondary | ICD-10-CM | POA: Diagnosis not present

## 2017-03-22 DIAGNOSIS — E039 Hypothyroidism, unspecified: Secondary | ICD-10-CM | POA: Diagnosis not present

## 2017-03-22 DIAGNOSIS — M6281 Muscle weakness (generalized): Secondary | ICD-10-CM | POA: Diagnosis present

## 2017-03-22 DIAGNOSIS — R634 Abnormal weight loss: Secondary | ICD-10-CM | POA: Diagnosis not present

## 2017-03-22 DIAGNOSIS — N319 Neuromuscular dysfunction of bladder, unspecified: Secondary | ICD-10-CM | POA: Diagnosis not present

## 2017-03-22 DIAGNOSIS — M8609 Acute hematogenous osteomyelitis, multiple sites: Secondary | ICD-10-CM | POA: Diagnosis not present

## 2017-03-22 DIAGNOSIS — I959 Hypotension, unspecified: Secondary | ICD-10-CM | POA: Diagnosis not present

## 2017-03-22 DIAGNOSIS — M8619 Other acute osteomyelitis, multiple sites: Secondary | ICD-10-CM | POA: Diagnosis not present

## 2017-03-22 LAB — COMPREHENSIVE METABOLIC PANEL
ALBUMIN: 2.4 g/dL — AB (ref 3.5–5.0)
ALK PHOS: 1012 U/L — AB (ref 38–126)
ALT: 90 U/L — AB (ref 14–54)
AST: 44 U/L — AB (ref 15–41)
Anion gap: 11 (ref 5–15)
BILIRUBIN TOTAL: 0.6 mg/dL (ref 0.3–1.2)
BUN: 9 mg/dL (ref 6–20)
CALCIUM: 7.1 mg/dL — AB (ref 8.9–10.3)
CO2: 24 mmol/L (ref 22–32)
CREATININE: 0.34 mg/dL — AB (ref 0.44–1.00)
Chloride: 106 mmol/L (ref 101–111)
GFR calc Af Amer: >60 mL/min (ref >60)
GFR calc non Af Amer: >60 mL/min (ref >60)
GLUCOSE: 125 mg/dL — AB (ref 65–99)
Potassium: 3.7 mmol/L (ref 3.5–5.1)
Sodium: 141 mmol/L (ref 135–145)
TOTAL PROTEIN: 5.7 g/dL — AB (ref 6.5–8.1)

## 2017-03-22 LAB — CULTURE, BLOOD (ROUTINE X 2)
CULTURE: NO GROWTH
Culture: NO GROWTH
SPECIAL REQUESTS: ADEQUATE
SPECIAL REQUESTS: ADEQUATE

## 2017-03-22 LAB — CBC
HEMATOCRIT: 23.8 % — AB (ref 36.0–46.0)
HEMOGLOBIN: 7.7 g/dL — AB (ref 12.0–15.0)
MCH: 27.4 pg (ref 26.0–34.0)
MCHC: 32.4 g/dL (ref 30.0–36.0)
MCV: 84.7 fL (ref 78.0–100.0)
Platelets: 465 10*3/uL — ABNORMAL HIGH (ref 150–400)
RBC: 2.81 MIL/uL — AB (ref 3.87–5.11)
RDW: 19.5 % — ABNORMAL HIGH (ref 11.5–15.5)
WBC: 8.8 10*3/uL (ref 4.0–10.5)

## 2017-03-22 MED ORDER — METRONIDAZOLE 500 MG PO TABS: 500.0000 mg | ORAL_TABLET | Freq: Three times a day (TID) | ORAL | 0 refills | Status: AC

## 2017-03-22 MED ORDER — IBUPROFEN 400 MG PO TABS
400.0000 mg | ORAL_TABLET | Freq: Once | ORAL | Status: AC
  Administered 2017-03-22: 400 mg via ORAL
  Filled 2017-03-22: qty 1

## 2017-03-22 MED ORDER — CEFTRIAXONE IV (FOR PTA / DISCHARGE USE ONLY): 2.0000 g | INTRAVENOUS | 0 refills | Status: AC

## 2017-03-22 MED ORDER — VANCOMYCIN IV (FOR PTA / DISCHARGE USE ONLY): 1000.0000 mg | INTRAVENOUS | 0 refills | Status: AC

## 2017-03-22 NOTE — Progress Notes (Signed)
Clinical Social Worker facilitated patient discharge including contacting patient family and facility to confirm patient discharge plans.  Clinical information faxed to facility and family agreeable with plan.  CSW arranged ambulance transport via [facility] to [facility] .  RN to call [phone] (pt will go in room 215 bed A)  report prior to discharge.  Clinical Social Worker will sign off for now as social work intervention is no longer needed. Please consult [place] again if new need arises.  Rhea Pink, MSW, [place]

## 2017-03-22 NOTE — Progress Notes (Signed)
Pt discharged to skilled nursing facility, [facility].Report called to receiving facility. RUE SL Picc in place upon dc for long term antibiotics. [facility] in to pick up pt to transport to SNF.

## 2017-03-22 NOTE — Clinical Social Work Placement (Signed)
   CLINICAL SOCIAL WORK PLACEMENT  NOTE  Date:  03/22/2017  Patient Details  Name: Deanna Baldwin MRN: [id number] Date of Birth: 1975/02/18  Clinical Social Work is seeking post-discharge placement for this patient at the [place] level of care (*CSW will initial, date and re-position this form in  chart as items are completed):  Yes   Patient/family provided with [place] Work Department's list of facilities offering this level of care within the geographic area requested by the patient (or if unable, by the patient's family).  Yes   Patient/family informed of their freedom to choose among providers that offer the needed level of care, that participate in Medicare, Medicaid or managed care program needed by the patient, have an available bed and are willing to accept the patient.  Yes   Patient/family informed of [place]'s ownership interest in [facility] and [facility], as well as of the fact that they are under no obligation to receive care at these facilities.  PASRR submitted to EDS on       PASRR number received on       Existing PASRR number confirmed on 03/22/17     FL2 transmitted to all facilities in geographic area requested by pt/family on 03/22/17     FL2 transmitted to all facilities within larger geographic area on       Patient informed that his/her managed care company has contracts with or will negotiate with certain facilities, including the following:            Patient/family informed of bed offers received.  Patient chooses bed at [facility])     Physician recommends and patient chooses bed at      Patient to be transferred to [facility]) on 03/22/17.  Patient to be transferred to facility by [facility]     Patient family notified on 03/22/17 of transfer.  Name of family member notified:  pt is cognitive x4     PHYSICIAN       Additional Comment:     _______________________________________________ Wende Neighbors, LCSW 03/22/2017, 11:57 AM

## 2017-03-22 NOTE — Discharge Summary (Addendum)
Physician Discharge Summary  Armando Lauman ZDG:[id number] DOB: 1975-07-20 DOA: 03/17/2017  PCP: [name], DO  Admit date: 03/17/2017 Discharge date: 03/22/2017  Admitted From: SNF Disposition:  SNF  Recommendations for Outpatient Follow-up:  1. Will need outpatient evaluation and work up of chronically elevated alk phos and LFTs. Numbers have remained stable during hospitalization but this will require further work up, GI referral.   2. Please follow up on the following pending results: final blood culture results, negative at time of discharge 3. Continue ceftriaxone 2 g IV q24h + vancomycin 1000 mg IV q24h + flagyl 557m PO TID. End date: 05/14/17. Biweekly BMP with GFR, weekly CBC with differential, weekly CRP, weekly ESR, weekly vanco trough. Remove PICC at completion of IV antibiotics. Fax weekly labs to [phone] Follow up with ID in 4 weeks  4. Please ensure patient turn every 2 hours and offload on wounds as much as possible. Also important to keep wounds clean and covered.  5. Wound care per wound RN consultation:   Dressing procedure/placement/frequency:   Enzymatic debridement ointment to the sacral wound to clean necrotic tissue. Saline topper  Silver hydrofiber to the right and left ischial wounds for recalcitrant wounds with bioburdan. To also manage moderate levels of exudate.   Low air loss mattress for pressure redistribution and moisture management  Her feet and LE are healed at this time and due to her contractures she is not really a candidate for use of Prevalon boots.  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 In [facility] ED, she was noted to be confused and groaning. She was admitted for sepsis, unclear etiology, started on vanco/zosyn. Wound RN recommended MR pelvis to evaluate her chronic wounds which revealed osteomyelitis. ID and general surgery were consulted. She was treated with hydrotherapy and recommended to continue local wound care at discharge. ID has recommended total 8 weeks antibiotics as listed above.   Discharge Diagnoses:  Principal Problem:   O[place]omyelitis ([place] Active Problems:   Elevated liver function tests   Protein-calorie malnutrition, severe (HCC)   Palliative care by specialist   Dysphagia, oral phase   Anemia of chronic disease   Neurogenic bladder   Sepsis (HCC)   Pressure ulcer, stage 4 (HCC)   Chronic pain   Chronic constipation   Paraplegia (HCC)   SIRS (systemic inflammatory response syndrome) (HCC)   Altered mental status   Dehydration   Acute osteomyelitis of left pelvic region and thigh ([place]  Acute metabolic encephalopathy -Resolved to baseline. Alert and oriented on my exam and able to answer questions appropriately.   Sepsis secondary to sacral and left ischial osteomyelitis, POA -MR pelvis: sacral decubitus ulcer with underlying osteomyelitis, deep decubitus ulcer over the left ischial tuberosity with osteomyelitis. Diffuse myositis without definite findings for pyomyositis or discrete drainable soft tissue abscess -General surgery consulted. Recommended hydrotherapy, no plans for surgery. General surgery does not do bone biopsy  -ID consulted. Recommended bone biopsy/wound culture for  antibiotic guidance -Spoke  with plastic surgery 2/15 for consult, they would consult once patient's wounds have improved for flap consideration -Spoke with orthopedic surgery 2/16 for bone biopsy, they do not recommend bone biopsy as it would be contaminated with open wound that patient presents with  -Blood cultures negative to date  -Due to lack of culture collection, patient will be on IV vanco/ceftriaxone/PO flagyl for 8 weeks. PICC placed  -PT for hydrotherapy, now signed off. Recommend local wound care and dressing changes   Hypernatremia -With poor PO intake -Resolved   Elevated liver enzymes -RUQ [place]: No acute or focal abnormality identified. No gallstones or biliary distention. Liver appears normal. -Hepatitis panel negative  -GGT 1034 -LFT has been stable. Note that LFT has been elevated as outpatient and will need further investigation as outpatient. Stop tylenol   Anemia of chronic disease -FOBT positive on admission, has hx of hemorrhoids. Transfused 1u pRBC on admission  -Watch for gross GI bleeding -Transfuse if hgb < 7 -Hgb stable without bleeding   Multiple sclerosis with spatic paraplegia, neurogenic bladder -Supportive care  -Urostomy care  -Continue baclofen -Turn q2h   Chronic hypotension -Continue midodrine   Chronic constipation -Bowel regimen  Goals of care -Palliative care consulted, continue present care at this time     Discharge Instructions  Discharge Instructions    Call MD for:  difficulty breathing, headache or visual disturbances   Complete by:  As directed    Call MD for:  extreme fatigue   Complete by:  As directed    Call MD for:  hives   Complete by:  As directed    Call MD for:  persistant dizziness or light-headedness   Complete by:  As directed    Call MD for:  persistant nausea and vomiting   Complete by:  As directed    Call MD for:  severe uncontrolled pain   Complete by:  As directed    Call MD for:  temperature  >100.4   Complete by:  As directed    Discharge wound care:   Complete by:  As directed    Wound care per wound RN consultation:  Dressing procedure/placement/frequency:  Enzymatic debridement ointment to the sacral wound to clean necrotic tissue. Saline topper Silver hydrofiber to the right and left ischial wounds for recalcitrant wounds with bioburdan. To also manage moderate levels of exudate.  Low air loss mattress for pressure redistribution and moisture management Her feet and LE are healed at this time and due to her contractures she is not really a candidate for use of Prevalon boots.   Home infusion instructions [facility] May follow [place] Dosing Protocol; May administer Cathflo as needed to maintain patency of vascular access device.; Flushing of vascular access device: per [facility] Protocol: 0.9% NaCl pre/post medica...   Complete by:  As directed    Instructions:  May follow [place] Dosing Protocol   Instructions:  May administer Cathflo as needed to maintain patency of vascular access device.   Instructions:  Flushing of vascular access device: per [facility] Protocol: 0.9% NaCl pre/post medication administration and prn patency; Heparin 100 u/ml, 25m for implanted ports and Heparin 10u/ml, 531mfor all other central venous catheters.   Instructions:  May follow [facility] Anaphylaxis Protocol for First Dose Administration in the home: 0.9% NaCl at 25-50 ml/hr to maintain IV access for protocol meds. Epinephrine 0.3 ml IV/IM PRN and Benadryl 25-50 IV/IM PRN s/s of anaphylaxis.   Instructions:  [facility] Infusion  Coordinator (RN) to assist per patient IV care needs in the home PRN.     Allergies as of 03/22/2017   No Known Allergies     Medication List    TAKE these medications   acetaminophen 500 MG tablet Commonly known as:  TYLENOL Take 1,000 mg by mouth 3 (three) times daily.   baclofen 20 MG tablet Commonly known as:  LIORESAL Take 20 mg by mouth 4 (four)  times daily.   bisacodyl 5 MG EC tablet Commonly known as:  DULCOLAX Take 5 mg by mouth daily.   bisacodyl 10 MG suppository Commonly known as:  DULCOLAX Place 1 suppository (10 mg total) rectally daily as needed for mild constipation or moderate constipation.   cefTRIAXone IVPB Commonly known as:  ROCEPHIN Inject 2 g into the vein daily. Indication: sacral osteomyelitis Last Day of Therapy:  05/14/17 Labs - Once weekly:  CBC/D and BMP, Labs - Every other week:  ESR and CRP   DECUBI-VITE Caps Take 1 capsule by mouth daily.   diclofenac sodium 1 % Gel Commonly known as:  VOLTAREN Apply 2 g topically 3 (three) times daily. Apply to Left shoulder   famotidine 20 MG tablet Commonly known as:  PEPCID Take 20 mg by mouth every morning.   feeding supplement (PRO-STAT SUGAR FREE 64) Liqd Take 30 mLs by mouth 2 (two) times daily.   ibuprofen 200 MG tablet Commonly known as:  ADVIL,MOTRIN Take 200 mg by mouth 3 (three) times daily.   metroNIDAZOLE 500 MG tablet Commonly known as:  FLAGYL Take 1 tablet (500 mg total) by mouth 3 (three) times daily.   midodrine 2.5 MG tablet Commonly known as:  PROAMATINE Take 1 tablet (2.5 mg total) by mouth 3 (three) times daily with meals.   ondansetron 4 MG tablet Commonly known as:  ZOFRAN Take 4 mg by mouth every 6 (six) hours as needed for nausea or vomiting.   polyethylene glycol packet Commonly known as:  MIRALAX / GLYCOLAX Take 17 g by mouth daily.   vancomycin IVPB Inject 1,000 mg into the vein daily. Indication: sacral osteomyelitis Last Day of Therapy:  05/14/17 Labs - Sunday/Monday:  CBC/D, BMP, and vancomycin trough. Labs - Thursday:  BMP and vancomycin trough Labs - Every other week:  ESR and CRP   VITAMIN B-12 PO Take 1 tablet by mouth daily.   vitamin C 500 MG tablet Commonly known as:  ASCORBIC ACID Take 500 mg by mouth 2 (two) times daily.   Vitamin D (Ergocalciferol) 50000 units Caps capsule Commonly known as:   DRISDOL Take 50,000 Units by mouth every 30 (thirty) days.            Home Infusion Instuctions  (From admission, onward)        Start     Ordered   03/22/17 0000  Home infusion instructions [facility] May follow [place] Dosing Protocol; May administer Cathflo as needed to maintain patency of vascular access device.; Flushing of vascular access device: per [facility] Protocol: 0.9% NaCl pre/post medica...    Question Answer Comment  Instructions May follow [place] Dosing Protocol   Instructions May administer Cathflo as needed to maintain patency of vascular access device.   Instructions Flushing of vascular access device: per [facility] Protocol: 0.9% NaCl pre/post medication administration and prn patency; Heparin 100 u/ml, 26m for implanted ports and Heparin 10u/ml, 528mfor all other central venous catheters.   Instructions May follow [facility] Anaphylaxis Protocol for First Dose Administration  in the home: 0.9% NaCl at 25-50 ml/hr to maintain IV access for protocol meds. Epinephrine 0.3 ml IV/IM PRN and Benadryl 25-50 IV/IM PRN s/s of anaphylaxis.   Instructions [facility] Infusion Coordinator (RN) to assist per patient IV care needs in the home PRN.      03/22/17 1122       Discharge Care Instructions  (From admission, onward)        Start     Ordered   03/22/17 0000  Discharge wound care:    Comments:  Wound care per wound RN consultation:  Dressing procedure/placement/frequency:  Enzymatic debridement ointment to the sacral wound to clean necrotic tissue. Saline topper Silver hydrofiber to the right and left ischial wounds for recalcitrant wounds with bioburdan. To also manage moderate levels of exudate.  Low air loss mattress for pressure redistribution and moisture management Her feet and LE are healed at this time and due to her contractures she is not really a candidate for use of Prevalon boots.   03/22/17 1122     Follow-up Information    Tommy Medal,  Lavell Islam, MD. Schedule an appointment as soon as possible for a visit in 1 month(s).   Specialty:  Infectious Diseases Contact information: [address] [place] [address] [phone]        [name], DO Follow up.   Specialty:  Internal Medicine Contact information: [place] [address] [phone]          No Known Allergies  Consultations:  ID  General Surgery    Procedures/Studies: Mr Pelvis Wo Contrast  Result Date: 03/17/2017 CLINICAL DATA:  Large decubitus ulcers EXAM: MRI PELVIS WITHOUT CONTRAST TECHNIQUE: Multiplanar multisequence MR imaging of the pelvis was performed. No intravenous contrast was administered. COMPARISON:  MRI 03/07/2016 FINDINGS: There is a sacral decubitus ulcer extending right down to the bone with underlying T2 signal abnormality in the S3 segment of the sacrum consistent with osteomyelitis. Decubitus ulcer over the left ischial tuberosity extending down to the bone. Abnormal T2 signal intensity in the bone suggesting osteomyelitis. No definite findings for septic arthritis or osteomyelitis involving the hips. 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 Edema like signal abnormality in the adductor muscles bilaterally, left greater than right suggesting myositis. Similar signal abnormality in the left thigh and hip muscles. No definite findings for pyomyositis. No significant intrapelvic abnormalities. No intrapelvic abscess is identified. The SI joints are intact. IMPRESSION: 1. Deep sacral decubitus ulcer with underlying osteomyelitis. 2. Deep decubitus ulcer over the left ischial tuberosity with osteomyelitis. 3. Diffuse myositis without definite findings for pyomyositis or discrete drainable soft tissue abscess. 4. No definite findings for septic arthritis. 5. 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 6. No definite intrapelvic abscess. Electronically Signed   By: Marijo Sanes M.D.   On: 03/17/2017 21:06   Dg Chest Port 1 View  Result Date: 03/17/2017 CLINICAL DATA:  Sepsis. EXAM: PORTABLE CHEST 1 VIEW COMPARISON:  03/17/2017 FINDINGS: Cardiomediastinal silhouette is normal. Mediastinal contours appear intact. There is no evidence of pneumothorax. Stable hyperinflation of the lungs. Stable left hemidiaphragmatic juxta peaking. Bilateral apical pleural thickening. Osseous structures are without acute abnormality. Soft tissues are grossly normal. IMPRESSION: Stable hyperinflation of the lungs. Stable left hemidiaphragmatic juxta peaking which may be seen with scarring or sub pulmonic effusion. Electronically Signed   By: Fidela Salisbury M.D.   On: 03/17/2017 09:25   Dg Chest Port 1 View  Result Date: 03/17/2017  CLINICAL DATA:  Altered mental status, abdominal pain beginning last night. History of multiple sclerosis. EXAM: PORTABLE CHEST 1 VIEW COMPARISON:  Chest radiograph August 31, 2016 FINDINGS: Cardiomediastinal silhouette is normal. Elevated LEFT hemidiaphragm with juxta peaking. Hyperinflation. Apical pleural thickening. No pneumothorax. Osteopenia. Cachexia. IMPRESSION: LEFT lung base juxtapeaking seen with scarring or subpulmonic effusion. Stable hyperinflation. Electronically Signed   By: Elon Alas M.D.   On: 03/17/2017 06:27   US Abdomen Limited Ruq  Result Date: 03/17/2017 CLINICAL DATA:  Elevated LFTs. EXAM: ULTRASOUND ABDOMEN LIMITED RIGHT UPPER QUADRANT COMPARISON:  09/06/2016. FINDINGS: Gallbladder: No gallstones or wall thickening visualized. No sonographic Murphy sign noted by sonographer. Common bile duct: Diameter: 1.1 mm Liver: No focal lesion identified. Within normal limits in parenchymal echogenicity. Portal vein is patent on color Doppler imaging with normal direction of blood flow towards the liver. IMPRESSION: No acute or focal abnormality identified. No gallstones or biliary distention. Liver appears normal. Electronically Signed    By: Marcello Moores  Register   On: 03/17/2017 10:06       Discharge Exam: Vitals:   03/21/17 2241 03/22/17 0551  BP: (!) 91/58 (!) 91/51  Pulse: 94 (!) 108  Resp: 18 18  Temp: 97.8 F (36.6 C) 97.9 F (36.6 C)  SpO2: 100% 100%   Vitals:   03/21/17 1335 03/21/17 2241 03/22/17 0407 03/22/17 0551  BP: 98/61 (!) 91/58  (!) 91/51  Pulse: (!) 101 94  (!) 108  Resp: '16 18  18  ' Temp: 97.9 F (36.6 C) 97.8 F (36.6 C)  97.9 F (36.6 C)  TempSrc: Oral Oral  Oral  SpO2: 100% 100%  100%  Weight:   43.1 kg (95 lb)   Height:        General: Pt is alert, awake, not in acute distress, chronically ill appearing and very frail  Cardiovascular: RRR, S1/S2 +, no rubs, no gallops Respiratory: CTA bilaterally, no wheezing, no rhonchi Abdominal: Soft, NT, ND, bowel sounds + Extremities: no edema, no cyanosis, lower extremity muscular atrophy and contractures present     The results of significant diagnostics from this hospitalization (including imaging, microbiology, ancillary and laboratory) are listed below for reference.     Microbiology: Recent Results (from the past 240 hour(s))  Urine culture     Status: Abnormal   Collection Time: 03/17/17  5:40 AM  Result Value Ref Range Status   Specimen Description URINE, RANDOM  Final   Special Requests   Final    NONE Performed at [facility], [address] [place], [place] [address]    Culture MULTIPLE SPECIES PRESENT, SUGGEST RECOLLECTION (A)  Final   Report Status 03/18/2017 FINAL  Final  Blood Culture (routine x 2)     Status: None (Preliminary result)   Collection Time: 03/17/17  8:19 AM  Result Value Ref Range Status   Specimen Description BLOOD LEFT FOREARM  Final   Special Requests IN PEDIATRIC BOTTLE Blood Culture adequate volume  Final   Culture   Final    NO GROWTH 4 DAYS Performed at [facility], [place] [address], [place], [place] [address]    Report Status PENDING  Incomplete  Blood Culture (routine x 2)      Status: None (Preliminary result)   Collection Time: 03/17/17  8:20 AM  Result Value Ref Range Status   Specimen Description BLOOD LEFT FEMORAL ARTERY  Final   Special Requests IN PEDIATRIC BOTTLE Blood Culture adequate volume  Final   Culture  Final    NO GROWTH 4 DAYS Performed at [facility], [place] [address] [place], [place] [address]    Report Status PENDING  Incomplete  MRSA PCR Screening     Status: Abnormal   Collection Time: 03/17/17  6:36 PM  Result Value Ref Range Status   MRSA by PCR POSITIVE (A) NEGATIVE Final    Comment:        The GeneXpert MRSA Assay (FDA approved for NASAL specimens only), is one component of a comprehensive MRSA colonization surveillance program. It is not intended to diagnose MRSA infection nor to guide or monitor treatment for MRSA infections. RESULT CALLED TO, READ BACK BY AND VERIFIED WITH: A..POTEAT,RN AT 2255 BY L.PITT 03/17/17      Labs: BNP (last 3 results) No results for input(s): BNP in the last 8760 hours. Basic Metabolic Panel: Recent Labs  Lab 03/18/17 0541 03/19/17 0340 03/20/17 1601 03/21/17 0355 03/22/17 0436  NA 152* 146* 144 143 141  K 4.1 3.3* 3.8 3.6 3.7  CL 126* 118* 113* 111 106  CO2 11* 14* 20* 21* 24  GLUCOSE 102* 148* 101* 128* 125*  BUN 30* '18 20 20 9  ' CREATININE 0.66 0.61 0.40* 0.37* 0.34*  CALCIUM 8.0* 7.6* 7.5* 7.5* 7.1*   Liver Function Tests: Recent Labs  Lab 03/18/17 0742 03/19/17 0745 03/20/17 1601 03/21/17 0355 03/22/17 0436  AST 33 103* 93* 81* 44*  ALT 123* 134* 125* 117* 90*  ALKPHOS 930* 1,131* 1,066* 1,093* 1,012*  BILITOT 0.8 0.5 0.6 0.3 0.6  PROT 6.1* 5.6* 5.6* 5.6* 5.7*  ALBUMIN 2.5* 2.4* 2.3* 2.4* 2.4*   Recent Labs  Lab 03/17/17 0918  LIPASE 128*   No results for input(s): AMMONIA in the last 168 hours. CBC: Recent Labs  Lab 03/17/17 0557 03/18/17 0541 03/19/17 0340 03/20/17 1601 03/21/17 0355 03/22/17 0436  WBC 12.6* 7.5 7.7 8.8 8.6 8.8  NEUTROABS 10.7*   --   --   --   --   --   HGB 6.8* 8.8* 8.1* 8.1* 7.9* 7.7*  HCT 21.4* 27.7* 24.5* 25.0* 24.2* 23.8*  MCV 86.3 86.6 83.9 84.5 84.3 84.7  PLT 702* 478* 479* 493* 482* 465*   Cardiac Enzymes: No results for input(s): CKTOTAL, CKMB, CKMBINDEX, TROPONINI in the last 168 hours. BNP: Invalid input(s): POCBNP CBG: No results for input(s): GLUCAP in the last 168 hours. D-Dimer No results for input(s): DDIMER in the last 72 hours. Hgb A1c No results for input(s): HGBA1C in the last 72 hours. Lipid Profile No results for input(s): CHOL, HDL, LDLCALC, TRIG, CHOLHDL, LDLDIRECT in the last 72 hours. Thyroid function studies No results for input(s): TSH, T4TOTAL, T3FREE, THYROIDAB in the last 72 hours.  Invalid input(s): FREET3 Anemia work up No results for input(s): VITAMINB12, FOLATE, FERRITIN, TIBC, IRON, RETICCTPCT in the last 72 hours. Urinalysis    Component Value Date/Time   COLORURINE AMBER (A) 03/17/2017 0537   APPEARANCEUR CLOUDY (A) 03/17/2017 0537   LABSPEC 1.012 03/17/2017 0537   PHURINE 7.0 03/17/2017 0537   GLUCOSEU 50 (A) 03/17/2017 0537   HGBUR SMALL (A) 03/17/2017 0537   BILIRUBINUR NEGATIVE 03/17/2017 0537   KETONESUR 5 (A) 03/17/2017 0537   PROTEINUR 100 (A) 03/17/2017 0537   UROBILINOGEN 0.2 06/25/2008 1551   NITRITE NEGATIVE 03/17/2017 0537   LEUKOCYTESUR SMALL (A) 03/17/2017 0537   Sepsis Labs Invalid input(s): PROCALCITONIN,  WBC,  LACTICIDVEN Microbiology Recent Results (from the past 240 hour(s))  Urine culture  Status: Abnormal   Collection Time: 03/17/17  5:40 AM  Result Value Ref Range Status   Specimen Description URINE, RANDOM  Final   Special Requests   Final    NONE Performed at [facility], [address] [place], [place] [address]    Culture MULTIPLE SPECIES PRESENT, SUGGEST RECOLLECTION (A)  Final   Report Status 03/18/2017 FINAL  Final  Blood Culture (routine x 2)     Status: None (Preliminary result)   Collection Time: 03/17/17   8:19 AM  Result Value Ref Range Status   Specimen Description BLOOD LEFT FOREARM  Final   Special Requests IN PEDIATRIC BOTTLE Blood Culture adequate volume  Final   Culture   Final    NO GROWTH 4 DAYS Performed at [facility], [place] [address], [place], [place] [address]    Report Status PENDING  Incomplete  Blood Culture (routine x 2)     Status: None (Preliminary result)   Collection Time: 03/17/17  8:20 AM  Result Value Ref Range Status   Specimen Description BLOOD LEFT FEMORAL ARTERY  Final   Special Requests IN PEDIATRIC BOTTLE Blood Culture adequate volume  Final   Culture   Final    NO GROWTH 4 DAYS Performed at [facility], [place] [address], [place], [place] [address]    Report Status PENDING  Incomplete  MRSA PCR Screening     Status: Abnormal   Collection Time: 03/17/17  6:36 PM  Result Value Ref Range Status   MRSA by PCR POSITIVE (A) NEGATIVE Final    Comment:        The GeneXpert MRSA Assay (FDA approved for NASAL specimens only), is one component of a comprehensive MRSA colonization surveillance program. It is not intended to diagnose MRSA infection nor to guide or monitor treatment for MRSA infections. RESULT CALLED TO, READ BACK BY AND VERIFIED WITH: A..POTEAT,RN AT 2255 BY L.PITT 03/17/17      Time coordinating discharge: 40 minutes  SIGNED:  Dessa Phi, DO [facility] Pager [phone]  If 7PM-7AM, please contact night-coverage [email or web address] Password [facility] 03/22/2017, 11:24 AM

## 2017-03-23 ENCOUNTER — Telehealth: Payer: Self-pay

## 2017-03-23 NOTE — Telephone Encounter (Signed)
Possible re-admission to facility. This is a patient you were seeing at [facility] . [facility] F/U is needed if patient was re-admitted to facility upon discharge. Hospital discharge from [facility] on 03/22/2017

## 2017-03-24 ENCOUNTER — Encounter: Payer: Self-pay | Admitting: Internal Medicine

## 2017-03-24 ENCOUNTER — Non-Acute Institutional Stay (SKILLED_NURSING_FACILITY): Payer: Medicare Other | Admitting: Internal Medicine

## 2017-03-24 DIAGNOSIS — M8609 Acute hematogenous osteomyelitis, multiple sites: Secondary | ICD-10-CM | POA: Diagnosis not present

## 2017-03-24 DIAGNOSIS — I959 Hypotension, unspecified: Secondary | ICD-10-CM

## 2017-03-24 DIAGNOSIS — G8220 Paraplegia, unspecified: Secondary | ICD-10-CM

## 2017-03-24 DIAGNOSIS — L89154 Pressure ulcer of sacral region, stage 4: Secondary | ICD-10-CM | POA: Diagnosis not present

## 2017-03-24 DIAGNOSIS — R634 Abnormal weight loss: Secondary | ICD-10-CM

## 2017-03-24 DIAGNOSIS — E43 Unspecified severe protein-calorie malnutrition: Secondary | ICD-10-CM

## 2017-03-24 DIAGNOSIS — G35 Multiple sclerosis: Secondary | ICD-10-CM

## 2017-03-24 NOTE — Progress Notes (Signed)
Patient ID: Deanna Baldwin, female   DOB: [birth year], 42 y.o.   MRN: [id number]  Provider:  DR Arletha Grippe Location:  [facility] of Service:  SNF (31)  PCP: [name], DO Patient Care Team: [name], DO as PCP - General (Internal Medicine) [facility])  Extended Emergency Contact Information Primary Emergency Contact: Dalbert Mayotte,  [address] [address] [place] Phone: [phone] Mobile Phone: [phone] Relation: Aunt Secondary Emergency Contact: Smith,Crystal  [place] Mobile Phone: [phone] Relation: Sister  Code Status: FULL CODE Goals of Care: Advanced Directive information Advanced Directives 01/27/2017  Does Patient Have a Medical Advance Directive? Yes  Type of Advance Directive -  Does patient want to make changes to medical advance directive? No - Patient declined  Copy of [place] in Chart? -  Would patient like information on creating a medical advance directive? No - Patient declined  Pre-existing out of facility DNR order (yellow form or pink MOST form) Pink MOST form placed in chart (order not valid for inpatient use)      Chief Complaint  Patient presents with  . Readmit To SNF    from hospital    HPI: Patient is a 42 y.o. 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 MRI pelvis revealed sacral decub ulcer with underlying osteo, deep decub ulcer over left ischial tuberosity with osteo; diffuse myositis. General sx consulted and recommended hydrotx. ID consulted and recommended bone bx/wound cx for abx guidance. Plastic sx recommended flap once wounds heal. Ortho did  not do bone bx as not appropriate due to already open wound and likelihood of contamination. PICC line placed for 8 week tx of IV vanco and rocephin; po flagyl. FOBT (+) on admission. She was transfused 1 unit PRBC for Hgb 6.8-->7.7; Na 148-->141; albumin 2.4; alk phos 1012; AST 44; ALT 90 at d/c. She presents to SNF for long term care.  Today she reports no concerns. Appetite ok. Sleeps well. She is getting turned every 2 hrs. Pain controlled. No f/c.  Chronic constipation - stable on miralax daily and dulcolax 5 mg tab daily   Dysphagia - stable. Followed by ST  Anemia of chronic disease - s/p 1 unit PRBC transfusion this admission. FOBT (+)  Elevated LFTs - w/u thus far neg. Hepatitis panel neg; RUQ [place] no acute process  Tachycardia with hypotension - stable on midodrine 2.5 mg three times daily  Chronic pain syndrome - stable on tylenol 1 gm three times daily; voltaren gel 2 gm to left shoulder three times daily; motrin 200 mg twice daily   MS with spastic paraplegia/neurogenic bladder - followed by neurology; she has bilateral lower extremity splints in place. 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  albumin 2.4; she gets nutritional supplements per facility protocol;     Past Medical History:  Diagnosis Date  . Buttock wound 03/03/2016  . Dysrhythmia    tachycardia  . MS (multiple sclerosis) ([place])   . Neutropenia ([place])   . Pneumonia 02/2016  . Protein calorie malnutrition ([place])   .  Severe sepsis ([place]) 03/03/2016  . Spastic 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 ([facility])   . UTI (urinary tract infection) 02/2016   Past Surgical History:  Procedure Laterality Date  . DIVERTING ILEOSTOMY N/A 09/03/2016   Procedure: ILEAL CONDUIT  URINARY DIVERSION OPEN;  Surgeon: Ardis Hughs, MD;  Location: [facility];  Service: Urology;   Laterality: N/A;    reports that she quit smoking about 7 years ago. Her smoking use included cigarettes. She has a 10.00 pack-year smoking history. she has never used smokeless tobacco. She reports that she does not drink alcohol or use drugs. Social History   Socioeconomic History  . Marital status: Single    Spouse name: Not on file  . Number of children: Not on file  . Years of education: Not on file  . Highest education level: Not on file  Social Needs  . Financial resource strain: Not on file  . Food insecurity - worry: Not on file  . Food insecurity - inability: Not on file  . Transportation needs - medical: Not on file  . Transportation needs - non-medical: Not on file  Occupational History  . Occupation: Disabled  Tobacco Use  . Smoking status: Former Smoker    Packs/day: 1.00    Years: 10.00    Pack years: 10.00    Types: Cigarettes    Last attempt to quit: 02/01/2010    Years since quitting: 7.1  . Smokeless tobacco: Never Used  Substance and Sexual Activity  . Alcohol use: No  . Drug use: No  . Sexual activity: Not on file  Other Topics Concern  . Not on file  Social History Narrative   She is a resident at [facility].   Right-handed.    Functional Status Survey:    Family History  Problem Relation Age of Onset  . Mental illness Sister     Health Maintenance  Topic Date Due  . PAP SMEAR  04/08/2017 (Originally 07/15/1997)  . TETANUS/TDAP  04/08/2017 (Originally 07/16/1995)  . INFLUENZA VACCINE  Completed  . HIV Screening  Completed    No Known Allergies  Outpatient Encounter Medications as of 03/24/2017  Medication Sig  . acetaminophen (TYLENOL) 500 MG tablet Take 1,000 mg by mouth 3 (three) times daily.   . Amino Acids-Protein Hydrolys (FEEDING SUPPLEMENT, PRO-STAT SUGAR FREE 64,) LIQD Take 30 mLs by mouth 2 (two) times daily.  . baclofen (LIORESAL) 20 MG tablet Take 20 mg by mouth 4 (four) times daily.  . bisacodyl (DULCOLAX) 10 MG  suppository Place 1 suppository (10 mg total) rectally daily as needed for mild constipation or moderate constipation.  . bisacodyl (DULCOLAX) 5 MG EC tablet Take 5 mg by mouth daily.  . cefTRIAXone (ROCEPHIN) IVPB Inject 2 g into the vein daily. Indication: sacral osteomyelitis Last Day of Therapy:  05/14/17 Labs - Once weekly:  CBC/D and BMP, Labs - Every other week:  ESR and CRP  . Cyanocobalamin (VITAMIN B-12 PO) Take 1 tablet by mouth daily.  . diclofenac sodium (VOLTAREN) 1 % GEL Apply 2 g topically 3 (three) times daily. Apply to Left shoulder  . famotidine (PEPCID) 20 MG tablet Take 20 mg by mouth every morning.  Marland Kitchen ibuprofen (ADVIL,MOTRIN) 200 MG tablet Take 200 mg by mouth 3 (three) times daily.  . metroNIDAZOLE (FLAGYL) 500 MG tablet Take 1 tablet (500 mg total) by mouth 3 (three) times daily.  . midodrine (PROAMATINE) 2.5 MG tablet Take 1 tablet (2.5 mg total) by mouth  3 (three) times daily with meals.  . Multiple Vitamins-Minerals (DECUBI-VITE) CAPS Take 1 capsule by mouth daily.  . ondansetron (ZOFRAN) 4 MG tablet Take 4 mg by mouth every 6 (six) hours as needed for nausea or vomiting.  . polyethylene glycol (MIRALAX / GLYCOLAX) packet Take 17 g by mouth daily.  . vancomycin IVPB Inject 1,000 mg into the vein daily. Indication: sacral osteomyelitis Last Day of Therapy:  05/14/17 Labs - Sunday/Monday:  CBC/D, BMP, and vancomycin trough. Labs - Thursday:  BMP and vancomycin trough Labs - Every other week:  ESR and CRP  . vitamin C (ASCORBIC ACID) 500 MG tablet Take 500 mg by mouth 2 (two) times daily.  . Vitamin D, Ergocalciferol, (DRISDOL) 50000 units CAPS capsule Take 50,000 Units by mouth every 30 (thirty) days.   No facility-administered encounter medications on file as of 03/24/2017.     Review of Systems  Musculoskeletal: Positive for arthralgias and gait problem.  Skin: Positive for wound.  All other systems reviewed and are negative.   Vitals:   03/24/17 1257  BP:  90/62  Pulse: 97  Temp: 98.6 F (37 C)  SpO2: 98%  Weight: 90 lb 6.4 oz (41 kg)   Body mass index is 13.35 kg/m. Physical Exam  Constitutional: She appears well-developed.  Frail appearing sitting up in bed in NAD, lying on left side  HENT:  Mouth/Throat: Oropharynx is clear and moist. No oropharyngeal exudate.  MMM; no oral thrush  Eyes: Pupils are equal, round, and reactive to light. No scleral icterus.  Neck: Neck supple. Carotid bruit is not present. No tracheal deviation present. No thyromegaly present.  Cardiovascular: Regular rhythm and intact distal pulses. Tachycardia present. Exam reveals no gallop and no friction rub.  Murmur heard.  Systolic murmur is present with a grade of 1/6. Right arm PICC line intact with no redness or d/c at insertion site; no LE edema b/l. No calf TTP  Pulmonary/Chest: Effort normal and breath sounds normal. No stridor. No respiratory distress. She has no wheezes. She has no rales.  Abdominal: Soft. Normal appearance and bowel sounds are normal. She exhibits no distension and no mass. There is no hepatomegaly. There is no tenderness. There is no rigidity, no rebound and no guarding. No hernia.  Urostomy intact with clear yellow urine; stoma appears pink  Musculoskeletal: She exhibits edema and deformity (extremity contractures ).  Lymphadenopathy:    She has no cervical adenopathy.  Neurological: She is alert.  Skin: Skin is warm and dry. No rash noted.  Sacral wound stage 4  - managed by facility wound care provider  Psychiatric: She has a normal mood and affect. Her behavior is normal. Thought content normal.    Labs reviewed: Basic Metabolic Panel: Recent Labs    09/07/16 0458 09/09/16 0501 09/10/16 0516  03/20/17 1601 03/21/17 0355 03/22/17 0436  NA 142 141 142   < > 144 143 141  K 3.5 4.1 3.5   < > 3.8 3.6 3.7  CL 105 107 106   < > 113* 111 106  CO2 '28 27 27   ' < > 20* 21* 24  GLUCOSE 93 84 96   < > 101* 128* 125*  BUN <5* <5* 9    < > '20 20 9  ' CREATININE <0.30* <0.30* <0.30*   < > 0.40* 0.37* 0.34*  CALCIUM 8.7* 8.8* 8.9   < > 7.5* 7.5* 7.1*  MG 1.8 1.8 1.8  --   --   --   --  PHOS 4.3 3.5 3.2  --   --   --   --    < > = values in this interval not displayed.   Liver Function Tests: Recent Labs    03/20/17 1601 03/21/17 0355 03/22/17 0436  AST 93* 81* 44*  ALT 125* 117* 90*  ALKPHOS 1,066* 1,093* 1,012*  BILITOT 0.6 0.3 0.6  PROT 5.6* 5.6* 5.7*  ALBUMIN 2.3* 2.4* 2.4*   Recent Labs    03/17/17 0918  LIPASE 128*   No results for input(s): AMMONIA in the last 8760 hours. CBC: Recent Labs    09/14/16 01/19/17 1357 03/17/17 0557  03/20/17 1601 03/21/17 0355 03/22/17 0436  WBC 15.3 8.4 12.6*   < > 8.8 8.6 8.8  NEUTROABS 13 6.4 10.7*  --   --   --   --   HGB 10.7* 10.1* 6.8*   < > 8.1* 7.9* 7.7*  HCT 35* 31.5* 21.4*   < > 25.0* 24.2* 23.8*  MCV  --  81 86.3   < > 84.5 84.3 84.7  PLT 601* 462* 702*   < > 493* 482* 465*   < > = values in this interval not displayed.   Cardiac Enzymes: Recent Labs    08/31/16 0259 08/31/16 1155 08/31/16 1449  TROPONINI <0.03 <0.03 <0.03   BNP: Invalid input(s): POCBNP Lab Results  Component Value Date   HGBA1C 5.5 06/24/2016   Lab Results  Component Value Date   TSH 4.540 (H) 01/19/2017   Lab Results  Component Value Date   VITAMINB12 261 09/08/2016   Lab Results  Component Value Date   FOLATE 10.1 04/07/2016   Lab Results  Component Value Date   IRON 28 09/08/2016   TIBC 172 (L) 09/08/2016   FERRITIN 205 09/08/2016    Imaging and Procedures obtained prior to SNF admission: Mr Pelvis Wo Contrast  Result Date: 03/17/2017 CLINICAL DATA:  Large decubitus ulcers EXAM: MRI PELVIS WITHOUT CONTRAST TECHNIQUE: Multiplanar multisequence MR imaging of the pelvis was performed. No intravenous contrast was administered. COMPARISON:  MRI 03/07/2016 FINDINGS: There is a sacral decubitus ulcer extending right down to the bone with underlying T2 signal  abnormality in the S3 segment of the sacrum consistent with osteomyelitis. Decubitus ulcer over the left ischial tuberosity extending down to the bone. Abnormal T2 signal intensity in the bone suggesting osteomyelitis. No definite findings for septic arthritis or osteomyelitis involving the hips. 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 Edema like signal abnormality in the adductor muscles bilaterally, left greater than right suggesting myositis. Similar signal abnormality in the left thigh and hip muscles. No definite findings for pyomyositis. No significant intrapelvic abnormalities. No intrapelvic abscess is identified. The SI joints are intact. IMPRESSION: 1. Deep sacral decubitus ulcer with underlying osteomyelitis. 2. Deep decubitus ulcer over the left ischial tuberosity with osteomyelitis. 3. Diffuse myositis without definite findings for pyomyositis or discrete drainable soft tissue abscess. 4. No definite findings for septic arthritis. 5. 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 6. No definite intrapelvic abscess. Electronically Signed   By: Marijo Sanes M.D.   On: 03/17/2017 21:06   Dg Chest Port 1 View  Result Date: 03/17/2017 CLINICAL DATA:  Sepsis. EXAM: PORTABLE CHEST 1 VIEW COMPARISON:  03/17/2017 FINDINGS: Cardiomediastinal silhouette is normal. Mediastinal contours appear intact. There is no evidence of pneumothorax. Stable hyperinflation of the lungs. Stable left hemidiaphragmatic juxta peaking. Bilateral apical pleural thickening. Osseous structures are without acute abnormality. Soft tissues are grossly normal. IMPRESSION: Stable  hyperinflation of the lungs. Stable left hemidiaphragmatic juxta peaking which may be seen with scarring or sub pulmonic effusion. Electronically Signed   By: Fidela Salisbury M.D.   On: 03/17/2017 09:25   Dg Chest Port 1 View  Result Date: 03/17/2017 CLINICAL DATA:  Altered mental status, abdominal pain beginning last  night. History of multiple sclerosis. EXAM: PORTABLE CHEST 1 VIEW COMPARISON:  Chest radiograph August 31, 2016 FINDINGS: Cardiomediastinal silhouette is normal. Elevated LEFT hemidiaphragm with juxta peaking. Hyperinflation. Apical pleural thickening. No pneumothorax. Osteopenia. Cachexia. IMPRESSION: LEFT lung base juxtapeaking seen with scarring or subpulmonic effusion. Stable hyperinflation. Electronically Signed   By: Elon Alas M.D.   On: 03/17/2017 06:27   US Abdomen Limited Ruq  Result Date: 03/17/2017 CLINICAL DATA:  Elevated LFTs. EXAM: ULTRASOUND ABDOMEN LIMITED RIGHT UPPER QUADRANT COMPARISON:  09/06/2016. FINDINGS: Gallbladder: No gallstones or wall thickening visualized. No sonographic Murphy sign noted by sonographer. Common bile duct: Diameter: 1.1 mm Liver: No focal lesion identified. Within normal limits in parenchymal echogenicity. Portal vein is patent on color Doppler imaging with normal direction of blood flow towards the liver. IMPRESSION: No acute or focal abnormality identified. No gallstones or biliary distention. Liver appears normal. Electronically Signed   By: Marcello Moores  Register   On: 03/17/2017 10:06    Assessment/Plan   ICD-10-CM   1. Acute hematogenous osteomyelitis of multiple sites ([place]) M86.09   2. Pressure injury of sacral region, stage 4 (HCC) L89.154   3. Protein-calorie malnutrition, severe ([place]) E43   4. Paraplegia (HCC) G82.20   5. Hypotension, unspecified hypotension type I95.9   6. Unintentional weight loss R63.4   7. Relapsing remitting multiple sclerosis ([place]) G35      Finish abx - IV rocephin/vanco, po flagyl STOP DATE 05/14/17. Remove PICC line after complete meds  F/u with ID as scheduled  Cont other meds as ordered  Wound care as ordered  PT/OT/ST as ordered. Hydrotherapy recommended at hospital d/c  Cont nutritional supplements as ordered  GOAL: short term rehab then continue long term care. Communicated with pt and  nursing.  Labs/tests ordered: weekly labs to be followed by ID    [facility]  [facility] [address] [place], [place] [address] [phone] Cell (Monday-Friday 8 AM - 5 PM) [phone] After 5 PM and follow prompts

## 2017-03-25 LAB — BASIC METABOLIC PANEL
BUN: 14 (ref 4–21)
Creatinine: 0.4 — AB (ref 0.5–1.1)
GLUCOSE: 112
POTASSIUM: 4.2 (ref 3.4–5.3)
Sodium: 141 (ref 137–147)

## 2017-03-25 LAB — CBC AND DIFFERENTIAL
HEMATOCRIT: 22 — AB (ref 36–46)
Hemoglobin: 7.5 — AB (ref 12.0–16.0)
Neutrophils Absolute: 7
Platelets: 488 — AB (ref 150–399)
WBC: 8.5

## 2017-03-31 LAB — CBC AND DIFFERENTIAL
HEMATOCRIT: 22 — AB (ref 36–46)
HEMOGLOBIN: 7.4 — AB (ref 12.0–16.0)
Neutrophils Absolute: 5
Platelets: 615 — AB (ref 150–399)
WBC: 7.5

## 2017-03-31 LAB — HEPATIC FUNCTION PANEL
ALT: 22 (ref 7–35)
AST: 25 (ref 13–35)
Alkaline Phosphatase: 603 — AB (ref 25–125)
Bilirubin, Total: 0.2

## 2017-03-31 LAB — BASIC METABOLIC PANEL
BUN: 18 (ref 4–21)
Creatinine: 0.4 — AB (ref 0.5–1.1)
GLUCOSE: 131
POTASSIUM: 5.4 — AB (ref 3.4–5.3)
SODIUM: 145 (ref 137–147)

## 2017-04-08 ENCOUNTER — Observation Stay (HOSPITAL_COMMUNITY)
Admission: EM | Admit: 2017-04-08 | Discharge: 2017-04-11 | Disposition: A | Discharge status: Skilled Nursing Facility | Payer: Medicare Other | Attending: Internal Medicine | Admitting: Internal Medicine

## 2017-04-08 ENCOUNTER — Encounter (HOSPITAL_COMMUNITY): Payer: Self-pay | Admitting: *Deleted

## 2017-04-08 DIAGNOSIS — Z87440 Personal history of urinary (tract) infections: Secondary | ICD-10-CM | POA: Insufficient documentation

## 2017-04-08 DIAGNOSIS — K5909 Other constipation: Secondary | ICD-10-CM | POA: Insufficient documentation

## 2017-04-08 DIAGNOSIS — E43 Unspecified severe protein-calorie malnutrition: Secondary | ICD-10-CM | POA: Diagnosis present

## 2017-04-08 DIAGNOSIS — L89154 Pressure ulcer of sacral region, stage 4: Secondary | ICD-10-CM | POA: Diagnosis not present

## 2017-04-08 DIAGNOSIS — E860 Dehydration: Secondary | ICD-10-CM | POA: Diagnosis present

## 2017-04-08 DIAGNOSIS — M869 Osteomyelitis, unspecified: Secondary | ICD-10-CM | POA: Diagnosis present

## 2017-04-08 DIAGNOSIS — D638 Anemia in other chronic diseases classified elsewhere: Secondary | ICD-10-CM | POA: Diagnosis present

## 2017-04-08 DIAGNOSIS — N319 Neuromuscular dysfunction of bladder, unspecified: Secondary | ICD-10-CM | POA: Insufficient documentation

## 2017-04-08 DIAGNOSIS — E538 Deficiency of other specified B group vitamins: Secondary | ICD-10-CM | POA: Diagnosis present

## 2017-04-08 DIAGNOSIS — G35 Multiple sclerosis: Secondary | ICD-10-CM | POA: Insufficient documentation

## 2017-04-08 DIAGNOSIS — M8669 Other chronic osteomyelitis, multiple sites: Secondary | ICD-10-CM | POA: Diagnosis not present

## 2017-04-08 DIAGNOSIS — I9589 Other hypotension: Principal | ICD-10-CM | POA: Insufficient documentation

## 2017-04-08 DIAGNOSIS — G35A Relapsing-remitting multiple sclerosis: Secondary | ICD-10-CM | POA: Diagnosis present

## 2017-04-08 DIAGNOSIS — L8994 Pressure ulcer of unspecified site, stage 4: Secondary | ICD-10-CM | POA: Diagnosis present

## 2017-04-08 DIAGNOSIS — I959 Hypotension, unspecified: Secondary | ICD-10-CM | POA: Diagnosis present

## 2017-04-08 DIAGNOSIS — D649 Anemia, unspecified: Secondary | ICD-10-CM | POA: Diagnosis present

## 2017-04-08 DIAGNOSIS — E039 Hypothyroidism, unspecified: Secondary | ICD-10-CM | POA: Diagnosis present

## 2017-04-08 DIAGNOSIS — M8618 Other acute osteomyelitis, other site: Secondary | ICD-10-CM | POA: Insufficient documentation

## 2017-04-08 DIAGNOSIS — IMO0002 Reserved for concepts with insufficient information to code with codable children: Secondary | ICD-10-CM | POA: Diagnosis present

## 2017-04-08 DIAGNOSIS — R64 Cachexia: Secondary | ICD-10-CM | POA: Insufficient documentation

## 2017-04-08 DIAGNOSIS — G8220 Paraplegia, unspecified: Secondary | ICD-10-CM

## 2017-04-08 DIAGNOSIS — M86152 Other acute osteomyelitis, left femur: Secondary | ICD-10-CM | POA: Diagnosis present

## 2017-04-08 DIAGNOSIS — Z87891 Personal history of nicotine dependence: Secondary | ICD-10-CM | POA: Insufficient documentation

## 2017-04-08 DIAGNOSIS — Z79899 Other long term (current) drug therapy: Secondary | ICD-10-CM | POA: Insufficient documentation

## 2017-04-08 LAB — BASIC METABOLIC PANEL
Anion gap: 11 (ref 5–15)
BUN: 19 mg/dL (ref 6–20)
CALCIUM: 6.2 mg/dL — AB (ref 8.9–10.3)
CHLORIDE: 107 mmol/L (ref 101–111)
CO2: 25 mmol/L (ref 22–32)
CREATININE: 0.38 mg/dL — AB (ref 0.44–1.00)
Glucose, Bld: 88 mg/dL (ref 65–99)
Potassium: 4.1 mmol/L (ref 3.5–5.1)
SODIUM: 143 mmol/L (ref 135–145)

## 2017-04-08 LAB — CBC WITH DIFFERENTIAL/PLATELET
BASOS PCT: 0 %
Basophils Absolute: 0 10*3/uL (ref 0.0–0.1)
EOS ABS: 0.5 10*3/uL (ref 0.0–0.7)
EOS PCT: 7 %
HCT: 18 % — ABNORMAL LOW (ref 36.0–46.0)
HEMOGLOBIN: 5.8 g/dL — AB (ref 12.0–15.0)
LYMPHS ABS: 1.2 10*3/uL (ref 0.7–4.0)
Lymphocytes Relative: 15 %
MCH: 29.3 pg (ref 26.0–34.0)
MCHC: 32.2 g/dL (ref 30.0–36.0)
MCV: 90.9 fL (ref 78.0–100.0)
MONO ABS: 0.7 10*3/uL (ref 0.1–1.0)
MONOS PCT: 9 %
NEUTROS PCT: 69 %
Neutro Abs: 5.6 10*3/uL (ref 1.7–7.7)
PLATELETS: 602 10*3/uL — AB (ref 150–400)
RBC: 1.98 MIL/uL — ABNORMAL LOW (ref 3.87–5.11)
RDW: 18.4 % — AB (ref 11.5–15.5)
WBC: 8.1 10*3/uL (ref 4.0–10.5)

## 2017-04-08 LAB — PREPARE RBC (CROSSMATCH)

## 2017-04-08 LAB — RETICULOCYTES
RBC.: 2.24 MIL/uL — AB (ref 3.87–5.11)
RETIC COUNT ABSOLUTE: 17.9 10*3/uL — AB (ref 19.0–186.0)
Retic Ct Pct: 0.8 % (ref 0.4–3.1)

## 2017-04-08 MED ORDER — SODIUM CHLORIDE 0.9 % IV SOLN
250.0000 mL | INTRAVENOUS | Status: DC | PRN
  Administered 2017-04-09: 250 mL via INTRAVENOUS

## 2017-04-08 MED ORDER — ACETAMINOPHEN 325 MG PO TABS: 650.0000 mg | ORAL_TABLET | Freq: Four times a day (QID) | ORAL | Status: DC | PRN

## 2017-04-08 MED ORDER — SODIUM CHLORIDE 0.9% FLUSH: 3.0000 mL | INTRAVENOUS | Status: DC | PRN

## 2017-04-08 MED ORDER — SODIUM CHLORIDE 0.9% FLUSH
3.0000 mL | Freq: Two times a day (BID) | INTRAVENOUS | Status: DC
  Administered 2017-04-09: 3 mL via INTRAVENOUS

## 2017-04-08 MED ORDER — SODIUM CHLORIDE 0.9 % IV SOLN
Freq: Once | INTRAVENOUS | Status: AC
  Administered 2017-04-09: 01:00:00 via INTRAVENOUS

## 2017-04-08 MED ORDER — VANCOMYCIN IV (FOR PTA / DISCHARGE USE ONLY): 1000.0000 mg | INTRAVENOUS | Status: DC

## 2017-04-08 MED ORDER — METRONIDAZOLE 500 MG PO TABS
500.0000 mg | ORAL_TABLET | Freq: Three times a day (TID) | ORAL | Status: DC
  Administered 2017-04-09 – 2017-04-11 (×8): 500 mg via ORAL
  Filled 2017-04-08 (×8): qty 1

## 2017-04-08 MED ORDER — BACLOFEN 10 MG PO TABS
20.0000 mg | ORAL_TABLET | Freq: Four times a day (QID) | ORAL | Status: DC
  Administered 2017-04-09 – 2017-04-11 (×4): 20 mg via ORAL
  Filled 2017-04-08 (×8): qty 2

## 2017-04-08 MED ORDER — MIDODRINE HCL 2.5 MG PO TABS
2.5000 mg | ORAL_TABLET | Freq: Three times a day (TID) | ORAL | Status: DC
  Administered 2017-04-09 – 2017-04-11 (×9): 2.5 mg via ORAL
  Filled 2017-04-08 (×10): qty 1

## 2017-04-08 MED ORDER — SODIUM CHLORIDE 0.9 % IV SOLN
1.0000 g | Freq: Once | INTRAVENOUS | Status: AC
  Administered 2017-04-08: 1 g via INTRAVENOUS
  Filled 2017-04-08: qty 10

## 2017-04-08 MED ORDER — BISACODYL 5 MG PO TBEC
5.0000 mg | DELAYED_RELEASE_TABLET | Freq: Every day | ORAL | Status: DC
  Administered 2017-04-09 – 2017-04-11 (×3): 5 mg via ORAL
  Filled 2017-04-08 (×3): qty 1

## 2017-04-08 MED ORDER — ACETAMINOPHEN 650 MG RE SUPP: 650.0000 mg | Freq: Four times a day (QID) | RECTAL | Status: DC | PRN

## 2017-04-08 MED ORDER — ONDANSETRON HCL 4 MG PO TABS: 4.0000 mg | ORAL_TABLET | Freq: Four times a day (QID) | ORAL | Status: DC | PRN

## 2017-04-08 MED ORDER — SODIUM CHLORIDE 0.9 % IV SOLN
10.0000 mL/h | Freq: Once | INTRAVENOUS | Status: AC
  Administered 2017-04-08: 10 mL/h via INTRAVENOUS

## 2017-04-08 MED ORDER — HYDROCODONE-ACETAMINOPHEN 5-325 MG PO TABS: 1.0000 | ORAL_TABLET | ORAL | Status: DC | PRN

## 2017-04-08 MED ORDER — ONDANSETRON HCL 4 MG/2ML IJ SOLN
4.0000 mg | Freq: Four times a day (QID) | INTRAMUSCULAR | Status: DC | PRN
Start: 2017-04-08 — End: 2017-04-12

## 2017-04-08 MED ORDER — FAMOTIDINE 20 MG PO TABS
20.0000 mg | ORAL_TABLET | Freq: Every morning | ORAL | Status: DC
  Administered 2017-04-09 – 2017-04-11 (×3): 20 mg via ORAL
  Filled 2017-04-08 (×3): qty 1

## 2017-04-08 MED ORDER — CEFTRIAXONE IV (FOR PTA / DISCHARGE USE ONLY): 2.0000 g | INTRAVENOUS | Status: DC

## 2017-04-08 NOTE — ED Notes (Signed)
Bed: LN98 Expected date:  Expected time:  Means of arrival:  Comments: EMS anemia from nursing home ROOM 12

## 2017-04-08 NOTE — ED Notes (Signed)
ED TO INPATIENT HANDOFF REPORT  Name/Age/Gender Deanna Baldwin 42 y.o. female  Code Status Code Status History    Date Active Date Inactive Code Status Order ID Comments User Context   03/17/2017 09:06 03/22/2017 16:58 Full Code [id number]  Johnney Ou ED   07/29/2016 00:27 08/03/2016 15:27 Full Code [id number]  Lily Kocher, MD ED   07/07/2016 21:46 07/11/2016 01:02 Full Code [id number]  Rogue Bussing, MD ED   04/05/2016 12:56 04/08/2016 17:27 Full Code [id number]  Reyne Dumas, MD ED   03/02/2016 22:11 03/15/2016 17:52 Full Code [id number]  Vianne Bulls, MD ED    Advance Directive Documentation     Most Recent Value  Type of Advance Directive  Healthcare Power of Attorney  Pre-existing out of facility DNR order (yellow form or pink MOST form)  No data  "MOST" Form in Place?  No data      Home/SNF/Other Skilled nursing facility  Chief Complaint anemia  Level of Care/Admitting Diagnosis ED Disposition    ED Disposition Condition Comment   Admit  Hospital Area: [facility] [[id number]]  Level of Care: Telemetry [5]  Admit to tele based on following criteria: Other see comments  Comments: anemia, hypotension  Diagnosis: Anemia [818563]  Admitting Physician: Toy Baker [[id number]]  Attending Physician: Toy Baker [[id number]]  PT Class (Do Not Modify): Observation [104]  PT Acc Code (Do Not Modify): Observation [10022]       Medical History Past Medical History:  Diagnosis Date  . Buttock wound 03/03/2016  . Dysrhythmia    tachycardia  . MS (multiple sclerosis) ([place])   . Neutropenia ([place])   . Pneumonia 02/2016  . Protein calorie malnutrition ([place])   . Severe sepsis ([place]) 03/03/2016  . Spastic 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 ([place])   . UTI (urinary tract infection) 02/2016    Allergies No Known Allergies  IV Location/Drains/Wounds Patient Lines/Drains/Airways Status   Active Line/Drains/Airways    Name:    Placement date:   Placement time:   Site:   Days:   PICC Single Lumen 14/97/02 PICC Right Basilic 35 cm 2 cm   63/78/58    8502    Basilic   19   Urostomy Ileal conduit RLQ   09/03/16    --    RLQ   217   Pressure Injury 03/17/17 Stage IV - Full thickness tissue loss with exposed bone, tendon or muscle. Sacrum   03/17/17    1007     22   Pressure Injury 03/17/17 Stage III -  Full thickness tissue loss. Subcutaneous fat may be visible but bone, tendon or muscle are NOT exposed. rt ischial tuberosity   03/17/17    1008     22   Pressure Injury 03/17/17 Stage III -  Full thickness tissue loss. Subcutaneous fat may be visible but bone, tendon or muscle are NOT exposed. Left ischial tuberosity   03/17/17    1009     22          Labs/Imaging Results for orders placed or performed during the hospital encounter of 04/08/17 (from the past 48 hour(s))  Type and screen [place]     Status: None (Preliminary result)   Collection Time: 04/08/17  4:20 PM  Result Value Ref Range   ABO/RH(D) AB POS    Antibody Screen NEG    Sample Expiration 04/11/2017    Unit Number [id number]    Blood Component Type RED CELLS,LR  Unit division 00    Status of Unit ISSUED    Transfusion Status OK TO TRANSFUSE    Crossmatch Result      Compatible Performed at [facility] [address] [place], [place] [address]    Unit Number [id number]    Blood Component Type RED CELLS,LR    Unit division 00    Status of Unit ALLOCATED    Transfusion Status OK TO TRANSFUSE    Crossmatch Result Compatible   CBC with Differential     Status: Abnormal   Collection Time: 04/08/17  4:24 PM  Result Value Ref Range   WBC 8.1 4.0 - 10.5 K/uL   RBC 1.98 (L) 3.87 - 5.11 MIL/uL   Hemoglobin 5.8 (LL) 12.0 - 15.0 g/dL    Comment: REPEATED TO VERIFY CRITICAL RESULT CALLED TO, READ BACK BY AND VERIFIED WITH: MORGAN ROSSER,RN [id number] @ 1726 BY J SCOTTON    HCT 18.0 (L) 36.0 - 46.0 %    MCV 90.9 78.0 - 100.0 fL   MCH 29.3 26.0 - 34.0 pg   MCHC 32.2 30.0 - 36.0 g/dL   RDW 18.4 (H) 11.5 - 15.5 %   Platelets 602 (H) 150 - 400 K/uL   Neutrophils Relative % 69 %   Neutro Abs 5.6 1.7 - 7.7 K/uL   Lymphocytes Relative 15 %   Lymphs Abs 1.2 0.7 - 4.0 K/uL   Monocytes Relative 9 %   Monocytes Absolute 0.7 0.1 - 1.0 K/uL   Eosinophils Relative 7 %   Eosinophils Absolute 0.5 0.0 - 0.7 K/uL   Basophils Relative 0 %   Basophils Absolute 0.0 0.0 - 0.1 K/uL    Comment: Performed at [facility], [place] [address], [place], [place] [address]  Basic metabolic panel     Status: Abnormal   Collection Time: 04/08/17  4:24 PM  Result Value Ref Range   Sodium 143 135 - 145 mmol/L   Potassium 4.1 3.5 - 5.1 mmol/L   Chloride 107 101 - 111 mmol/L   CO2 25 22 - 32 mmol/L   Glucose, Bld 88 65 - 99 mg/dL   BUN 19 6 - 20 mg/dL   Creatinine, Ser 0.38 (L) 0.44 - 1.00 mg/dL   Calcium 6.2 (LL) 8.9 - 10.3 mg/dL    Comment: CRITICAL RESULT CALLED TO, READ BACK BY AND VERIFIED WITH: J.TALKINGTON AT 0973 ON 04/08/17 BY N.THOMPSON    GFR calc non Af Amer >60 >60 mL/min   GFR calc Af Amer >60 >60 mL/min    Comment: (NOTE) The eGFR has been calculated using the CKD EPI equation. This calculation has not been validated in all clinical situations. eGFR's persistently <60 mL/min signify possible Chronic Kidney Disease.    Anion gap 11 5 - 15    Comment: Performed at [facility], [place] [address] [place], [place] [address]  Prepare RBC     Status: None   Collection Time: 04/08/17  7:22 PM  Result Value Ref Range   Order Confirmation      ORDER PROCESSED BY BLOOD BANK Performed at [facility] [address] [place], [place] [address]   Reticulocytes     Status: Abnormal   Collection Time: 04/08/17  9:50 PM  Result Value Ref Range   Retic Ct Pct 0.8 0.4 - 3.1 %   RBC. 2.24 (L) 3.87 - 5.11 MIL/uL   Retic Count, Absolute 17.9 (L) 19.0 -  186.0 K/uL  Comment: Performed at [facility], [place] [address] [place], [place] [address]   No results found.  Pending Labs Unresulted Labs (From admission, onward)   Start     Ordered   04/08/17 1922  Vitamin B12  (Anemia Panel (PNL))  STAT,   STAT     04/08/17 1921   04/08/17 1922  Folate  (Anemia Panel (PNL))  STAT,   STAT     04/08/17 1921   04/08/17 1922  Iron and TIBC  (Anemia Panel (PNL))  STAT,   STAT     04/08/17 1921   04/08/17 1922  Ferritin  (Anemia Panel (PNL))  STAT,   STAT     04/08/17 1921   Signed and Held  Magnesium  Tomorrow morning,   R    Comments:  Call MD if <1.5    Signed and Held   Signed and Held  Phosphorus  Tomorrow morning,   R     Signed and Held   Signed and Held  TSH  Once,   R    Comments:  Cancel if already done within 1 month and notify MD    Signed and Held   Signed and Held  Comprehensive metabolic panel  Once,   R    Comments:  Cal MD for K<3.5 or >5.0    Signed and Held   Visual merchandiser and Held  CBC  Once,   R    Comments:  Call for hg <8.0    Signed and Held   Signed and Held  Type and screen  Once,   R     Signed and Held   Signed and Held  Prepare RBC  (Adult Blood Administration - Red Blood Cells)  Once,   R    Question Answer Comment  # of Units 2 units   Transfusion Indications Symptomatic Anemia   If emergent release call blood bank Not emergent release   Instructions: Transfuse      Signed and Held      Vitals/Pain Today's Vitals   04/08/17 1800 04/08/17 1900 04/08/17 2142 04/08/17 2250  BP: '92/67 94/69 93/62 ' 91/60  Pulse: (!) 113 (!) 110 (!) 110 97  Resp: '16 16 18 16  ' Temp:    99.2 F (37.3 C)  TempSrc:    Oral  SpO2: 100% 100% 97% 100%    Isolation Precautions No active isolations  Medications Medications  0.9 %  sodium chloride infusion (10 mL/hr Intravenous New Bag/Given 04/08/17 2153)  calcium gluconate 1 g in sodium chloride 0.9 % 100 mL IVPB (0 g Intravenous Stopped 04/08/17 2255)     Mobility non-ambulatory

## 2017-04-08 NOTE — ED Notes (Signed)
ED TO INPATIENT HANDOFF REPORT  Name/Age/Gender Deanna Baldwin 42 y.o. female  Code Status    Code Status Orders  (From admission, onward)        Start     Ordered   04/08/17 2336  Full code  Continuous     04/08/17 2335    Code Status History    Date Active Date Inactive Code Status Order ID Comments User Context   03/17/2017 09:06 03/22/2017 16:58 Full Code [id number]  Johnney Ou ED   07/29/2016 00:27 08/03/2016 15:27 Full Code [id number]  Lily Kocher, MD ED   07/07/2016 21:46 07/11/2016 01:02 Full Code [id number]  Rogue Bussing, MD ED   04/05/2016 12:56 04/08/2016 17:27 Full Code [id number]  Reyne Dumas, MD ED   03/02/2016 22:11 03/15/2016 17:52 Full Code [id number]  Vianne Bulls, MD ED    Advance Directive Documentation     Most Recent Value  Type of Advance Directive  Healthcare Power of Attorney  Pre-existing out of facility DNR order (yellow form or pink MOST form)  No data  "MOST" Form in Place?  No data      Home/SNF/Other Nursing Home  Chief Complaint anemia  Level of Care/Admitting Diagnosis ED Disposition    ED Disposition Condition Comment   Admit  Hospital Area: [facility] [[id number]]  Level of Care: Telemetry [5]  Admit to tele based on following criteria: Other see comments  Comments: anemia, hypotension  Diagnosis: Anemia [628315]  Admitting Physician: Toy Baker [[id number]]  Attending Physician: Toy Baker [[id number]]  PT Class (Do Not Modify): Observation [104]  PT Acc Code (Do Not Modify): Observation [10022]       Medical History Past Medical History:  Diagnosis Date  . Buttock wound 03/03/2016  . Dysrhythmia    tachycardia  . MS (multiple sclerosis) ([place])   . Neutropenia ([place])   . Pneumonia 02/2016  . Protein calorie malnutrition ([place])   . Severe sepsis ([place]) 03/03/2016  . 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 UTI (urinary tract infection) 02/2016    Allergies No  Known Allergies  IV Location/Drains/Wounds Patient Lines/Drains/Airways Status   Active Line/Drains/Airways    Name:   Placement date:   Placement time:   Site:   Days:   PICC Single Lumen 17/61/60 PICC Right Basilic 35 cm 2 cm   73/71/06    2694    Basilic   19   Urostomy Ileal conduit RLQ   09/03/16    -    RLQ   217   Pressure Injury 03/17/17 Stage IV - Full thickness tissue loss with exposed bone, tendon or muscle. Sacrum   03/17/17    1007     22   Pressure Injury 03/17/17 Stage III -  Full thickness tissue loss. Subcutaneous fat may be visible but bone, tendon or muscle are NOT exposed. rt ischial tuberosity   03/17/17    1008     22   Pressure Injury 03/17/17 Stage III -  Full thickness tissue loss. Subcutaneous fat may be visible but bone, tendon or muscle are NOT exposed. Left ischial tuberosity   03/17/17    1009     22          Labs/Imaging Results for orders placed or performed during the hospital encounter of 04/08/17 (from the past 48 hour(s))  Type and screen [place]     Status: None (Preliminary result)   Collection Time: 04/08/17  4:20 PM  Result Value Ref Range   ABO/RH(D) AB POS    Antibody Screen NEG    Sample Expiration 04/11/2017    Unit Number [id number]    Blood Component Type RED CELLS,LR    Unit division 00    Status of Unit ISSUED    Transfusion Status OK TO TRANSFUSE    Crossmatch Result      Compatible Performed at [facility] [address] [place], [place] [address]    Unit Number [id number]    Blood Component Type RED CELLS,LR    Unit division 00    Status of Unit ALLOCATED    Transfusion Status OK TO TRANSFUSE    Crossmatch Result Compatible   CBC with Differential     Status: Abnormal   Collection Time: 04/08/17  4:24 PM  Result Value Ref Range   WBC 8.1 4.0 - 10.5 K/uL   RBC 1.98 (L) 3.87 - 5.11 MIL/uL   Hemoglobin 5.8 (LL) 12.0 - 15.0 g/dL    Comment: REPEATED TO VERIFY CRITICAL RESULT  CALLED TO, READ BACK BY AND VERIFIED WITH: MORGAN ROSSER,RN [id number] @ 1726 BY J SCOTTON    HCT 18.0 (L) 36.0 - 46.0 %   MCV 90.9 78.0 - 100.0 fL   MCH 29.3 26.0 - 34.0 pg   MCHC 32.2 30.0 - 36.0 g/dL   RDW 18.4 (H) 11.5 - 15.5 %   Platelets 602 (H) 150 - 400 K/uL   Neutrophils Relative % 69 %   Neutro Abs 5.6 1.7 - 7.7 K/uL   Lymphocytes Relative 15 %   Lymphs Abs 1.2 0.7 - 4.0 K/uL   Monocytes Relative 9 %   Monocytes Absolute 0.7 0.1 - 1.0 K/uL   Eosinophils Relative 7 %   Eosinophils Absolute 0.5 0.0 - 0.7 K/uL   Basophils Relative 0 %   Basophils Absolute 0.0 0.0 - 0.1 K/uL    Comment: Performed at [facility], [place] [address] [place], [place] [address]  Basic metabolic panel     Status: Abnormal   Collection Time: 04/08/17  4:24 PM  Result Value Ref Range   Sodium 143 135 - 145 mmol/L   Potassium 4.1 3.5 - 5.1 mmol/L   Chloride 107 101 - 111 mmol/L   CO2 25 22 - 32 mmol/L   Glucose, Bld 88 65 - 99 mg/dL   BUN 19 6 - 20 mg/dL   Creatinine, Ser 0.38 (L) 0.44 - 1.00 mg/dL   Calcium 6.2 (LL) 8.9 - 10.3 mg/dL    Comment: CRITICAL RESULT CALLED TO, READ BACK BY AND VERIFIED WITH: J.TALKINGTON AT 5035 ON 04/08/17 BY N.THOMPSON    GFR calc non Af Amer >60 >60 mL/min   GFR calc Af Amer >60 >60 mL/min    Comment: (NOTE) The eGFR has been calculated using the CKD EPI equation. This calculation has not been validated in all clinical situations. eGFR's persistently <60 mL/min signify possible Chronic Kidney Disease.    Anion gap 11 5 - 15    Comment: Performed at [facility], [place] [address], [place], [place] [address]  Prepare RBC     Status: None   Collection Time: 04/08/17  7:22 PM  Result Value Ref Range   Order Confirmation      ORDER PROCESSED BY BLOOD BANK Performed at [facility] [address] [place], [place] [address]   Reticulocytes     Status: Abnormal   Collection Time: 04/08/17  9:50 PM  Result Value  Ref Range   Retic Ct Pct 0.8 0.4 - 3.1 %   RBC. 2.24 (L) 3.87 - 5.11 MIL/uL   Retic Count, Absolute 17.9 (L) 19.0 - 186.0 K/uL    Comment: Performed at [facility], [place] [address] [place], [place] [address]   No results found.  Pending Labs Unresulted Labs (From admission, onward)   Start     Ordered   04/09/17 0500  Magnesium  Tomorrow morning,   R    Comments:  Call MD if <1.5    04/08/17 2335   04/09/17 0500  Phosphorus  Tomorrow morning,   R     04/08/17 2335   04/09/17 0500  TSH  Once,   R    Comments:  Cancel if already done within 1 month and notify MD    04/08/17 2335   04/09/17 0500  Comprehensive metabolic panel  Once,   R    Comments:  Cal MD for K<3.5 or >5.0    04/08/17 2335   04/09/17 0500  CBC  Once,   R    Comments:  Call for hg <8.0    04/08/17 2335   04/08/17 2336  Type and screen  Once,   R     04/08/17 2335   04/08/17 2336  Prepare RBC  (Adult Blood Administration - Red Blood Cells)  Once,   R    Question Answer Comment  # of Units 2 units   Transfusion Indications Symptomatic Anemia   If emergent release call blood bank Not emergent release   Instructions: Transfuse      04/08/17 2335   04/08/17 1922  Vitamin B12  (Anemia Panel (PNL))  STAT,   STAT     04/08/17 1921   04/08/17 1922  Folate  (Anemia Panel (PNL))  STAT,   STAT     04/08/17 1921   04/08/17 1922  Iron and TIBC  (Anemia Panel (PNL))  STAT,   STAT     04/08/17 1921   04/08/17 1922  Ferritin  (Anemia Panel (PNL))  STAT,   STAT     04/08/17 1921      Vitals/Pain Today's Vitals   04/08/17 2142 04/08/17 2250 04/08/17 2309 04/08/17 2330  BP: 93/62 91/60 (!) 96/58 98/61  Pulse: (!) 110 97 94 97  Resp: _0 Temp:  99.2 F (37.3 C) 98.4 F (36.9 C) 98.5 F (36.9 C)  TempSrc:  Oral Oral Oral  SpO2: 97% 100% 100% 100%  PainSc:    0-No pain    Isolation Precautions No active isolations  Medications Medications  baclofen (LIORESAL) tablet 20 mg (not  administered)  bisacodyl (DULCOLAX) EC tablet 5 mg (not administered)  cefTRIAXone (ROCEPHIN) IVPB (not administered)  midodrine (PROAMATINE) tablet 2.5 mg (not administered)  metroNIDAZOLE (FLAGYL) tablet 500 mg (not administered)  famotidine (PEPCID) tablet 20 mg (not administered)  vancomycin IVPB (not administered)  acetaminophen (TYLENOL) tablet 650 mg (not administered)    Or  acetaminophen (TYLENOL) suppository 650 mg (not administered)  HYDROcodone-acetaminophen (NORCO/VICODIN) 5-325 MG per tablet 1-2 tablet (not administered)  ondansetron (ZOFRAN) tablet 4 mg (not administered)    Or  ondansetron (ZOFRAN) injection 4 mg (not administered)  0.9 %  sodium chloride infusion (not administered)  sodium chloride flush (NS) 0.9 % injection 3 mL (not administered)  sodium chloride flush (NS) 0.9 % injection 3 mL (not administered)  0.9 %  sodium chloride infusion (not administered)  0.9 %  sodium chloride infusion (10 mL/hr Intravenous Transfusing/Transfer 04/08/17 2338)  calcium gluconate 1 g in sodium chloride 0.9 % 100 mL IVPB (0 g Intravenous Stopped 04/08/17 2255)    Mobility non-ambulatory

## 2017-04-08 NOTE — ED Notes (Signed)
Date and time results received: 04/08/17 5:25 PM   Test: hemoglobin Critical Value: 5.8  Name of Provider Notified: Alvino Chapel MD  Orders Received? Or Actions Taken?: awaiting orders

## 2017-04-08 NOTE — ED Triage Notes (Signed)
Transported by [facility] from [facility]--([place]), hx of MS, paraplegic--lab work completed today, "low hemoglobin." Hx of transfusion. A & O x 4.

## 2017-04-08 NOTE — ED Provider Notes (Signed)
[facility] DEPT Provider Note   CSN: [id number] Arrival date & time: 04/08/17  1544     History   Chief Complaint Chief Complaint  Patient presents with  . Abnormal Lab    HPI Deanna Baldwin is a 42 y.o. female.  HPI Patient sent in for transfusion from the assisted living.  History of iron deficiency anemia and questionable GI bleeds.  Reportedly recent admission to the hospital for sacral osteomyelitis.  Is on Vanco Rocephin and oral Flagyl.  Has a PICC line.  Blood work was drawn by provider and found to have hemoglobin of 6.6.  Hemoglobin normally around upper sevens or 8.  Has been iron deficient and has reported fecal occult positive during recent hospitalization.  Patient reportedly not a good candidate for colonoscopy due to her deep sacral wounds at this time.  Outpatient physician could not get her transfused until Monday and sent into the ER for this.  There was some question of vaginal bleeding the patient reportedly had a hysterectomy.  Patient is chronically bedbound due to her multiple sclerosis. Past Medical History:  Diagnosis Date  . Buttock wound 03/03/2016  . Dysrhythmia    tachycardia  . MS (multiple sclerosis) ([place])   . Neutropenia ([place])   . Pneumonia 02/2016  . Protein calorie malnutrition ([place])   . Severe sepsis ([place]) 03/03/2016  . 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 UTI (urinary tract infection) 02/2016    Patient Active Problem List   Diagnosis Date Noted  . Osteomyelitis ([place]) 03/19/2017  . Altered mental status   . Dehydration   . Acute osteomyelitis of left pelvic region and thigh ([place])   . SIRS (systemic inflammatory response syndrome) ([place]) 03/17/2017  . Paraplegia ([place]) 03/09/2017  . Chronic constipation 02/13/2017  . Elevated liver enzymes 02/13/2017  . Chronic pain 12/13/2016  . Leukocytosis 09/15/2016  . Hypotension 08/31/2016  . Vitamin B 12 deficiency 07/09/2016  . 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 Sepsis ([place]) 07/07/2016  . Unintentional weight loss 07/06/2016  . Relapsing remitting multiple sclerosis ([place]) 06/08/2016  . Neurogenic bladder 04/09/2016  . Tachycardia 04/08/2016  . Anemia of chronic disease 04/06/2016  . Dysphagia, oral phase 03/17/2016  . Palliative care by specialist   . Protein-calorie malnutrition, severe ([place])   . 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 UTI (urinary tract infection) 03/02/2016  . Elevated liver function tests 03/02/2016    Past Surgical History:  Procedure Laterality Date  . DIVERTING ILEOSTOMY N/A 09/03/2016   Procedure: ILEAL CONDUIT  URINARY DIVERSION OPEN;  Surgeon: Ardis Hughs, MD;  Location: [facility];  Service: Urology;  Laterality: N/A;    OB History    No data available       Home Medications    Prior to Admission medications   Medication Sig Start Date End Date Taking? Authorizing Provider  acetaminophen (TYLENOL) 500 MG tablet Take 1,000 mg by mouth 3 (three) times daily.     [provider]  Amino Acids-Protein Hydrolys (FEEDING SUPPLEMENT, PRO-STAT SUGAR FREE 64,) LIQD Take 30 mLs by mouth 2 (two) times daily. 09/10/16   Elgergawy, Silver Huguenin, MD  baclofen (LIORESAL) 20 MG tablet Take 20 mg by mouth 4 (four) times daily.    [provider]  bisacodyl (DULCOLAX) 10 MG suppository Place 1 suppository (10 mg total) rectally daily as needed for mild constipation or moderate constipation. 09/10/16   Elgergawy, Silver Huguenin, MD  bisacodyl (DULCOLAX) 5 MG EC tablet Take 5 mg by mouth daily.    [provider]  cefTRIAXone (ROCEPHIN) IVPB Inject 2 g into the vein daily. Indication: sacral osteomyelitis Last Day of Therapy:  05/14/17 Labs - Once weekly:  CBC/D and BMP, Labs - Every other week:  ESR and CRP 03/22/17 05/15/17  Dessa Phi, DO  Cyanocobalamin (VITAMIN B-12 PO) Take 1 tablet by mouth daily.    [provider]  diclofenac  sodium (VOLTAREN) 1 % GEL Apply 2 g topically 3 (three) times daily. Apply to Left shoulder    [provider]  famotidine (PEPCID) 20 MG tablet Take 20 mg by mouth every morning.    [provider]  ibuprofen (ADVIL,MOTRIN) 200 MG tablet Take 200 mg by mouth 3 (three) times daily.    [provider]  metroNIDAZOLE (FLAGYL) 500 MG tablet Take 1 tablet (500 mg total) by mouth 3 (three) times daily. 03/22/17 05/14/17  Dessa Phi, DO  midodrine (PROAMATINE) 2.5 MG tablet Take 1 tablet (2.5 mg total) by mouth 3 (three) times daily with meals. 09/10/16   Elgergawy, Silver Huguenin, MD  Multiple Vitamins-Minerals (DECUBI-VITE) CAPS Take 1 capsule by mouth daily.    [provider]  ondansetron (ZOFRAN) 4 MG tablet Take 4 mg by mouth every 6 (six) hours as needed for nausea or vomiting.    [provider]  polyethylene glycol (MIRALAX / GLYCOLAX) packet Take 17 g by mouth daily.    [provider]  vancomycin IVPB Inject 1,000 mg into the vein daily. Indication: sacral osteomyelitis Last Day of Therapy:  05/14/17 Labs - Sunday/Monday:  CBC/D, BMP, and vancomycin trough. Labs - Thursday:  BMP and vancomycin trough Labs - Every other week:  ESR and CRP 03/22/17 05/15/17  Dessa Phi, DO  vitamin C (ASCORBIC ACID) 500 MG tablet Take 500 mg by mouth 2 (two) times daily.    [provider]  Vitamin D, Ergocalciferol, (DRISDOL) 50000 units CAPS capsule Take 50,000 Units by mouth every 30 (thirty) days.    [provider]    Family History Family History  Problem Relation Age of Onset  . Mental illness Sister     Social History Social History   Tobacco Use  . Smoking status: Former Smoker    Packs/day: 1.00    Years: 10.00    Pack years: 10.00    Types: Cigarettes    Last attempt to quit: 02/01/2010    Years since quitting: 7.1  . Smokeless tobacco: Never Used  Substance Use Topics  . Alcohol use: No  . Drug use: No      Allergies   Patient has no known allergies.   Review of Systems Review of Systems  Constitutional: Negative for appetite change and fever.  Cardiovascular: Negative for chest pain.  Gastrointestinal: Negative for abdominal pain.  Musculoskeletal: Negative for back pain.     Physical Exam Updated Vital Signs BP 92/67   Pulse (!) 113   Temp 98.8 F (37.1 C) (Oral)   Resp 16   LMP 01/28/2016   SpO2 100%   Physical Exam  Constitutional: She appears well-developed.  Eyes: Pupils are equal, round, and reactive to light.  Cardiovascular:  Mild tachycardia.  Reported baseline.  Pulmonary/Chest: She exhibits no tenderness.  Abdominal: There is no tenderness.  Musculoskeletal: She exhibits no tenderness.  PICC line on right upper extremity is uncapped.  Neurological: She is alert.  Skin: Skin is warm. Capillary refill takes less than  2 seconds. There is pallor.     ED Treatments / Results  Labs (all labs ordered are listed, but only abnormal results are displayed) Labs Reviewed  CBC WITH DIFFERENTIAL/PLATELET - Abnormal; Notable for the following components:      Result Value   RBC 1.98 (*)    Hemoglobin 5.8 (*)    HCT 18.0 (*)    RDW 18.4 (*)    Platelets 602 (*)    All other components within normal limits  BASIC METABOLIC PANEL - Abnormal; Notable for the following components:   Creatinine, Ser 0.38 (*)    Calcium 6.2 (*)    All other components within normal limits  TYPE AND SCREEN    EKG  EKG Interpretation None       Radiology No results found.  Procedures Procedures (including critical care time)  Medications Ordered in ED Medications - No data to display   Initial Impression / Assessment and Plan / ED Course  I have reviewed the triage vital signs and the nursing notes.  Pertinent labs & imaging results that were available during my care of the patient were reviewed by me and considered in my medical decision making (see chart for  details).     Patient with anemia.  Sent in for transfusion.  History of anemia and has been guaiac positive in the past.  Also reported iron deficiency.  Also has chronic hypocalcemia.  Has been  orally supplemented.  Discussed with the patient's PCP.  Will admit to hospitalist.  Final Clinical Impressions(s) / ED Diagnoses   Final diagnoses:  Anemia, unspecified type  Hypocalcemia    ED Discharge Orders    None       Davonna Belling, MD 04/08/17 1839

## 2017-04-08 NOTE — ED Notes (Signed)
CRITICAL VALUE STICKER  CRITICAL VALUE: Calcium 6.2  RECEIVER (on-site recipient of call): Maylon Cos T RN  DATE & TIME NOTIFIED: 04/08/17 555p  MESSENGER (representative from lab): Ovid Curd  MD NOTIFIED: Alvino Chapel  TIME OF NOTIFICATION: 555p  RESPONSE: see orders

## 2017-04-08 NOTE — H&P (Signed)
Deanna Baldwin SKA:[id number] DOB: 1975-04-16 DOA: 04/08/2017     PCP: [name], DO   Outpatient Specialists: Neurology Sondra Come Patient coming from:  From facility [facility])    Chief Complaint: Abnormal hemoglobin  HPI: Deanna Baldwin is a 42 y.o. 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  Patient reports she started having vaginal bleeding again after no cycles for 5 years. Denies heavy vaginal bleeding, Per pelvic US in march 2018 was unremarkable.   reports her wounds have been healing.    Recently was admitted for sepsis due to osteomyelitis discharged to SNF on 22 March 2017.  During admission she was started of hydrotherapy ID has seen her in consult and recommended 8 weeks antibiotics on IV vancomycin and ceftriaxone p.o. Flagyl PICC line was placed Patient has known history of anemia of chronic disease during prior admission FOBT was positive she does have history of hemorrhoids she required 1 unit of packed red blood cells on prior admission with plan to transfuse for hemoglobin below 7. Reportedly in the past patient felt to be not a good candidate for colonoscopy secondary to severe sacral decub.   Significant initial  Findings: WBC 8.1 hemoglobin 5.8 platelets 602 Sodium 143 K4.1 creatinine 0.38   while in ER: Hemoglobin was found to be 5.8 Ordered 2 units of packed red blood cells and calcium was repleted      IN ER:  Temp (24hrs), Avg:98.8 F (37.1 C), Min:98.8 F (37.1 C), Max:98.8 F  (37.1 C)      on arrival  ED Triage Vitals [04/08/17 1555]  Enc Vitals Group     BP 99/71     Pulse Rate (!) 102     Resp 18     Temp 98.8 F (37.1 C)     Temp Source Oral     SpO2 98 %     Weight      Height      Head Circumference      Peak Flow      Pain Score      Pain Loc      Pain Edu?      Excl. in [place]?     Latest RR 16 100% Hr 110 Bp 94/69 chronically low  Following Medications were ordered in ER: Medications  0.9 %  sodium chloride infusion (not administered)      Hospitalist was called for admission for anemia needing blood transfusion  Regarding pertinent Chronic problems: Patient is on dysphagia 3 mechanical soft diet  Review of Systems:    Pertinent positives include: vaginal bleeding.   Constitutional:  No weight loss, night sweats, Fevers, chills, fatigue, weight loss  HEENT:  No headaches, Difficulty swallowing,Tooth/dental problems,Sore throat,  No sneezing, itching, ear ache, nasal congestion, post nasal drip,  Cardio-vascular:  No chest pain, Orthopnea, PND, anasarca, dizziness, palpitations.no Bilateral lower extremity swelling  GI:  No heartburn, indigestion, abdominal pain, nausea, vomiting, diarrhea, change in bowel habits, loss of appetite, melena, blood in stool, hematemesis Resp:  no shortness of breath at rest. No dyspnea on exertion, No excess mucus,  no productive cough, No non-productive cough, No coughing up of blood.No change in color of mucus.No wheezing. Skin:  no rash or lesions. No jaundice GU:  no dysuria, change in color of urine, no urgency or frequency. No straining to urinate.  No flank pain.  Musculoskeletal:  No joint pain or no joint swelling. No decreased range of motion. No back pain.  Psych:  No change in mood or affect. No depression or anxiety. No memory loss.  Neuro: no localizing neurological complaints, no tingling, no weakness, no double vision, no gait abnormality, no slurred speech, no confusion  As per  HPI otherwise 10 point review of systems negative.   Past Medical History: Past Medical History:  Diagnosis Date  . Buttock wound 03/03/2016  . Dysrhythmia    tachycardia  . MS (multiple sclerosis) ([place])   . Neutropenia ([place])   . Pneumonia 02/2016  . Protein calorie malnutrition ([place])   . Severe sepsis ([place]) 03/03/2016  . Spastic 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 ([place])   . UTI (urinary tract infection) 02/2016   Past Surgical History:  Procedure Laterality Date  . DIVERTING ILEOSTOMY N/A 09/03/2016   Procedure: ILEAL CONDUIT  URINARY DIVERSION OPEN;  Surgeon: Ardis Hughs, MD;  Location: [facility];  Service: Urology;  Laterality: N/A;     Social History:  Ambulatory bed bound    reports that she quit smoking about 7 years ago. Her smoking use included cigarettes. She has a 10.00 pack-year smoking history. she has never used smokeless tobacco. She reports that she does not drink alcohol or use drugs.  Allergies:  No Known Allergies     Family History:   Family History  Problem Relation Age of Onset  . Mental illness Sister     Medications: Prior to Admission medications   Medication Sig Start Date End Date Taking? Authorizing Provider  acetaminophen (TYLENOL) 500 MG tablet Take 1,000 mg by mouth 3 (three) times daily.     [provider]  Amino Acids-Protein Hydrolys (FEEDING SUPPLEMENT, PRO-STAT SUGAR FREE 64,) LIQD Take 30 mLs by mouth 2 (two) times daily. 09/10/16   Elgergawy, Silver Huguenin, MD  baclofen (LIORESAL) 20 MG tablet Take 20 mg by mouth 4 (four) times daily.    [provider]  bisacodyl (DULCOLAX) 10 MG suppository Place 1 suppository (10 mg total) rectally daily as needed for mild constipation or moderate constipation. 09/10/16   Elgergawy, Silver Huguenin, MD  bisacodyl (DULCOLAX) 5 MG EC tablet Take 5 mg by mouth daily.    [provider]  cefTRIAXone (ROCEPHIN) IVPB Inject 2 g into the vein daily. Indication: sacral  osteomyelitis Last Day of Therapy:  05/14/17 Labs - Once weekly:  CBC/D and BMP, Labs - Every other week:  ESR and CRP 03/22/17 05/15/17  Dessa Phi, DO  Cyanocobalamin (VITAMIN B-12 PO) Take 1 tablet by mouth daily.    [provider]  diclofenac sodium (VOLTAREN) 1 % GEL Apply 2 g topically 3 (three) times daily. Apply to Left shoulder    [provider]  famotidine (PEPCID) 20 MG tablet Take 20 mg by mouth every morning.    [provider]  ibuprofen (ADVIL,MOTRIN) 200 MG tablet Take 200 mg by mouth 3 (three) times daily.    [provider]  metroNIDAZOLE (FLAGYL) 500 MG tablet Take 1 tablet (500 mg total) by mouth 3 (three) times daily. 03/22/17 05/14/17  Dessa Phi, DO  midodrine (PROAMATINE) 2.5 MG tablet Take 1 tablet (2.5 mg total)  by mouth 3 (three) times daily with meals. 09/10/16   Elgergawy, Silver Huguenin, MD  Multiple Vitamins-Minerals (DECUBI-VITE) CAPS Take 1 capsule by mouth daily.    [provider]  ondansetron (ZOFRAN) 4 MG tablet Take 4 mg by mouth every 6 (six) hours as needed for nausea or vomiting.    [provider]  polyethylene glycol (MIRALAX / GLYCOLAX) packet Take 17 g by mouth daily.    [provider]  vancomycin IVPB Inject 1,000 mg into the vein daily. Indication: sacral osteomyelitis Last Day of Therapy:  05/14/17 Labs - Sunday/Monday:  CBC/D, BMP, and vancomycin trough. Labs - Thursday:  BMP and vancomycin trough Labs - Every other week:  ESR and CRP 03/22/17 05/15/17  Dessa Phi, DO  vitamin C (ASCORBIC ACID) 500 MG tablet Take 500 mg by mouth 2 (two) times daily.    [provider]  Vitamin D, Ergocalciferol, (DRISDOL) 50000 units CAPS capsule Take 50,000 Units by mouth every 30 (thirty) days.    [provider]    Physical Exam: Patient Vitals for the past 24 hrs:  BP Temp Temp src Pulse Resp SpO2  04/08/17 1900 94/69 - - (!) 110 16 100 %  04/08/17 1800 92/67 - - (!) 113 16  100 %  04/08/17 1730 (!) 87/58 - - (!) 102 16 99 %  04/08/17 1700 (!) 91/59 - - (!) 101 16 98 %  04/08/17 1630 90/65 - - 100 16 (!) 82 %  04/08/17 1555 99/71 98.8 F (37.1 C) Oral (!) 102 18 98 %    1. General:  in No Acute distress  Chronically ill  -appearing 2. Psychological: Alert and   Oriented 3. Head/ENT:   Moist   Mucous Membranes                          Head Non traumatic, neck supple                         Poor Dentition 4. SKIN:   decreased Skin turgor,  Skin clean Dry and intact no rash 5. Heart: Regular rate and rhythm no Murmur, no Rub or gallop 6. Lungs:   no wheezes or crackles   7. Abdomen: Soft, non-tender, Non distended, thin  8. Lower extremities: no clubbing, cyanosis, or edema 9. Neurologically paraplegic chronically secondary to multiple sclerosis 10. MSK: diminished range of motion   body mass index is unknown because there is no height or weight on file.  Labs on Admission:   Labs on Admission: I have personally reviewed following labs and imaging studies  CBC: Recent Labs  Lab 04/08/17 1624  WBC 8.1  NEUTROABS 5.6  HGB 5.8*  HCT 18.0*  MCV 90.9  PLT 086*   Basic Metabolic Panel: Recent Labs  Lab 04/08/17 1624  NA 143  K 4.1  CL 107  CO2 25  GLUCOSE 88  BUN 19  CREATININE 0.38*  CALCIUM 6.2*   GFR: CrCl cannot be calculated (Unknown ideal weight.). Liver Function Tests: No results for input(s): AST, ALT, ALKPHOS, BILITOT, PROT, ALBUMIN in the last 168 hours. No results for input(s): LIPASE, AMYLASE in the last 168 hours. No results for input(s): AMMONIA in the last 168 hours. Coagulation Profile: No results for input(s): INR, PROTIME in the last 168 hours. Cardiac Enzymes: No results for input(s): CKTOTAL, CKMB, CKMBINDEX, TROPONINI in the last 168 hours. BNP (last 3 results) No results  for input(s): PROBNP in the last 8760 hours. HbA1C: No results for input(s): HGBA1C in the last 72 hours. CBG: No results for input(s):  GLUCAP in the last 168 hours. Lipid Profile: No results for input(s): CHOL, HDL, LDLCALC, TRIG, CHOLHDL, LDLDIRECT in the last 72 hours. Thyroid Function Tests: No results for input(s): TSH, T4TOTAL, FREET4, T3FREE, THYROIDAB in the last 72 hours. Anemia Panel: No results for input(s): VITAMINB12, FOLATE, FERRITIN, TIBC, IRON, RETICCTPCT in the last 72 hours. Urine analysis:  Sepsis Labs: _0 (procalcitonin:4,lacticidven:4) )No results found for this or any previous visit (from the past 240 hour(s)).   UA  not ordered  Lab Results  Component Value Date   HGBA1C 5.5 06/24/2016    CrCl cannot be calculated (Unknown ideal weight.).  BNP (last 3 results) No results for input(s): PROBNP in the last 8760 hours.   ECG REPORT Not ordered  There were no vitals filed for this visit.   Cultures:    Component Value Date/Time   SDES BLOOD LEFT FEMORAL ARTERY 03/17/2017 0820   SPECREQUEST IN PEDIATRIC BOTTLE Blood Culture adequate volume 03/17/2017 0820   CULT  03/17/2017 0820    NO GROWTH 5 DAYS Performed at [facility], [place] [address], [place], [place] [address]    REPTSTATUS 03/22/2017 FINAL 03/17/2017 0820     Radiological Exams on Admission: No results found.  Chart has been reviewed    Assessment/Plan   42 y.o. female with medical history significant ofspastic paraplegia secondary to MS, neurogenic bladder s/p urostomy with frequent UTI, chronic decub ulcers, chronic constipation, hemorrhoids history of osteomyelitis on 6 IV antibiotics Admitted for anemia requiring blood transfusion   Present on Admission: . Anemia of chronic disease  - given severity will transfuse 2 units, obtain anemia panel, hx of Hem positive stools but has hemorrhoids, denies blood in stool.    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 Pressure ulcer, stage 4 ([place]) - patient states improving, continue wound  care at SNF, continue  Antibiotics at SNF as recommended by ID  . Osteomyelitis ([place])  - chronic on IV antibiotics will continue . Hypocalcemia - will replace . Vaginal bleeding - Possible resumption of menstrual bleeding due to improved  Nutritional status. mild, will need to be evaluated by OBGYN as outpatient.    Other plan as per orders.  DVT prophylaxis:  SCD   Code Status:  FULL CODE  as per patient    Family Communication:   Family not  at  Bedside    Disposition Plan:                             Back to current facility when stable                                                                      Social Work   consulted                          Consults called: none    Admission status   obs   Level of care   tele  I have spent a total of 57 min on this admission    Detron Carras 04/08/2017, 9:04 PM    [facility]  Pager [phone]   after 2 AM please page floor coverage PA If 7AM-7PM, please contact the day team taking care of the patient  [email or web address]  Password [email or web address]

## 2017-04-09 ENCOUNTER — Other Ambulatory Visit: Payer: Self-pay

## 2017-04-09 DIAGNOSIS — E43 Unspecified severe protein-calorie malnutrition: Secondary | ICD-10-CM

## 2017-04-09 DIAGNOSIS — M86152 Other acute osteomyelitis, left femur: Secondary | ICD-10-CM | POA: Diagnosis not present

## 2017-04-09 DIAGNOSIS — E860 Dehydration: Secondary | ICD-10-CM

## 2017-04-09 DIAGNOSIS — E538 Deficiency of other specified B group vitamins: Secondary | ICD-10-CM

## 2017-04-09 DIAGNOSIS — E039 Hypothyroidism, unspecified: Secondary | ICD-10-CM

## 2017-04-09 DIAGNOSIS — D649 Anemia, unspecified: Secondary | ICD-10-CM | POA: Diagnosis not present

## 2017-04-09 DIAGNOSIS — I9589 Other hypotension: Secondary | ICD-10-CM | POA: Diagnosis not present

## 2017-04-09 DIAGNOSIS — G35 Multiple sclerosis: Secondary | ICD-10-CM | POA: Diagnosis not present

## 2017-04-09 DIAGNOSIS — M8669 Other chronic osteomyelitis, multiple sites: Secondary | ICD-10-CM | POA: Diagnosis not present

## 2017-04-09 LAB — PHOSPHORUS: Phosphorus: 6 mg/dL — ABNORMAL HIGH (ref 2.5–4.6)

## 2017-04-09 LAB — COMPREHENSIVE METABOLIC PANEL
ALT: 14 U/L (ref 14–54)
AST: 19 U/L (ref 15–41)
Albumin: 3 g/dL — ABNORMAL LOW (ref 3.5–5.0)
Alkaline Phosphatase: 298 U/L — ABNORMAL HIGH (ref 38–126)
Anion gap: 9 (ref 5–15)
BUN: 20 mg/dL (ref 6–20)
CHLORIDE: 110 mmol/L (ref 101–111)
CO2: 25 mmol/L (ref 22–32)
Calcium: 6.4 mg/dL — CL (ref 8.9–10.3)
Creatinine, Ser: 0.45 mg/dL (ref 0.44–1.00)
Glucose, Bld: 89 mg/dL (ref 65–99)
POTASSIUM: 4.3 mmol/L (ref 3.5–5.1)
SODIUM: 144 mmol/L (ref 135–145)
Total Bilirubin: 0.5 mg/dL (ref 0.3–1.2)
Total Protein: 6.5 g/dL (ref 6.5–8.1)

## 2017-04-09 LAB — VANCOMYCIN, TROUGH: Vancomycin Tr: 10 ug/mL — ABNORMAL LOW (ref 15–20)

## 2017-04-09 LAB — CBC
HCT: 28.6 % — ABNORMAL LOW (ref 36.0–46.0)
Hemoglobin: 9.6 g/dL — ABNORMAL LOW (ref 12.0–15.0)
MCH: 29.2 pg (ref 26.0–34.0)
MCHC: 33.6 g/dL (ref 30.0–36.0)
MCV: 86.9 fL (ref 78.0–100.0)
Platelets: 469 10*3/uL — ABNORMAL HIGH (ref 150–400)
RBC: 3.29 MIL/uL — ABNORMAL LOW (ref 3.87–5.11)
RDW: 17.8 % — ABNORMAL HIGH (ref 11.5–15.5)
WBC: 7.5 10*3/uL (ref 4.0–10.5)

## 2017-04-09 LAB — TSH: TSH: 14.353 u[IU]/mL — AB (ref 0.350–4.500)

## 2017-04-09 LAB — PREPARE RBC (CROSSMATCH)

## 2017-04-09 LAB — MAGNESIUM: Magnesium: 1.6 mg/dL — ABNORMAL LOW (ref 1.7–2.4)

## 2017-04-09 LAB — IRON AND TIBC
Iron: 51 ug/dL (ref 28–170)
Saturation Ratios: 26 % (ref 10.4–31.8)
TIBC: 199 ug/dL — ABNORMAL LOW (ref 250–450)
UIBC: 148 ug/dL

## 2017-04-09 LAB — FOLATE: FOLATE: 30 ng/mL (ref >5.9)

## 2017-04-09 LAB — FERRITIN: Ferritin: 736 ng/mL — ABNORMAL HIGH (ref 11–307)

## 2017-04-09 LAB — VITAMIN B12: VITAMIN B 12: 690 pg/mL (ref 180–914)

## 2017-04-09 MED ORDER — CALCIUM CITRATE-VITAMIN D 500-500 MG-UNIT PO CHEW: 2.0000 | CHEWABLE_TABLET | Freq: Four times a day (QID) | ORAL | Status: DC

## 2017-04-09 MED ORDER — LEVOTHYROXINE SODIUM 25 MCG PO TABS
25.0000 ug | ORAL_TABLET | Freq: Every day | ORAL | Status: DC
  Administered 2017-04-09 – 2017-04-11 (×3): 25 ug via ORAL
  Filled 2017-04-09 (×3): qty 1

## 2017-04-09 MED ORDER — SODIUM CHLORIDE 0.9 % IV SOLN
2.0000 g | INTRAVENOUS | Status: DC
  Administered 2017-04-09 – 2017-04-11 (×3): 2 g via INTRAVENOUS
  Filled 2017-04-09 (×3): qty 2

## 2017-04-09 MED ORDER — CALCIUM CARBONATE-VITAMIN D 500-200 MG-UNIT PO TABS
2.0000 | ORAL_TABLET | Freq: Four times a day (QID) | ORAL | Status: DC
  Administered 2017-04-09 – 2017-04-11 (×11): 2 via ORAL
  Filled 2017-04-09 (×13): qty 2

## 2017-04-09 MED ORDER — VITAMIN D3 25 MCG (1000 UNIT) PO TABS
1000.0000 [IU] | ORAL_TABLET | Freq: Every day | ORAL | Status: DC
  Administered 2017-04-09 – 2017-04-11 (×3): 1000 [IU] via ORAL
  Filled 2017-04-09 (×3): qty 1

## 2017-04-09 MED ORDER — VANCOMYCIN HCL IN DEXTROSE 1-5 GM/200ML-% IV SOLN
1000.0000 mg | INTRAVENOUS | Status: DC
  Administered 2017-04-09 – 2017-04-11 (×3): 1000 mg via INTRAVENOUS
  Filled 2017-04-09 (×3): qty 200

## 2017-04-09 MED ORDER — SODIUM CHLORIDE 0.9 % IV SOLN
2.0000 g | Freq: Once | INTRAVENOUS | Status: AC
  Administered 2017-04-09: 2 g via INTRAVENOUS
  Filled 2017-04-09: qty 20

## 2017-04-09 MED ORDER — SODIUM CHLORIDE 0.9 % IV SOLN
2.0000 g | INTRAVENOUS | Status: DC
  Filled 2017-04-09: qty 20

## 2017-04-09 MED ORDER — CEFTRIAXONE IV (FOR PTA / DISCHARGE USE ONLY): 2.0000 g | INTRAVENOUS | Status: DC

## 2017-04-09 MED ORDER — VANCOMYCIN IV (FOR PTA / DISCHARGE USE ONLY): 1000.0000 mg | INTRAVENOUS | Status: DC

## 2017-04-09 MED ORDER — MAGNESIUM SULFATE 4 GM/100ML IV SOLN
4.0000 g | Freq: Once | INTRAVENOUS | Status: AC
  Administered 2017-04-09: 4 g via INTRAVENOUS
  Filled 2017-04-09: qty 100

## 2017-04-09 MED ORDER — POLYETHYLENE GLYCOL 3350 17 G PO PACK
17.0000 g | PACK | Freq: Every day | ORAL | Status: DC
  Filled 2017-04-09 (×2): qty 1

## 2017-04-09 MED ORDER — TIZANIDINE HCL 2 MG PO TABS
2.0000 mg | ORAL_TABLET | Freq: Three times a day (TID) | ORAL | Status: DC
  Administered 2017-04-09 – 2017-04-11 (×8): 2 mg via ORAL
  Filled 2017-04-09 (×9): qty 1

## 2017-04-09 MED ORDER — SODIUM CHLORIDE 0.9% FLUSH: 10.0000 mL | INTRAVENOUS | Status: DC | PRN

## 2017-04-09 MED ORDER — FERROUS SULFATE 325 (65 FE) MG PO TABS
325.0000 mg | ORAL_TABLET | Freq: Every day | ORAL | Status: DC
Start: 2017-04-10 — End: 2017-04-12
  Administered 2017-04-10 – 2017-04-11 (×2): 325 mg via ORAL
  Filled 2017-04-09 (×3): qty 1

## 2017-04-09 NOTE — Progress Notes (Signed)
Pharmacy Antibiotic Note  Deanna Baldwin is a 42 y.o. female admitted on 04/08/2017 with iron deficiency anemia.   Pt on ceftriaxone ,flagyl and vancomycin for sacral osteomyelitis (to continue til 05/14/17).  Pharmacy has been consulted for vancomycin dosing.  04/09/2017 VT= 10 Scr 0.45 WBC 7.5  Plan: Continue vancomycin 1gm IV Q24h Ceftriaxone 2gm IV q24h per MD Flagyl 500mg  po tid per MD Follow renal function and clinical course  Height: 5\' 9"  (175.3 cm) Weight: 95 lb 14.4 oz (43.5 kg) IBW/kg (Calculated) : 66.2  Temp (24hrs), Avg:98.7 F (37.1 C), Min:98.2 F (36.8 C), Max:99.2 F (37.3 C)  Recent Labs  Lab 04/08/17 1624 04/09/17 0759 04/09/17 0900  WBC 8.1 7.5  --   CREATININE 0.38* 0.45  --   VANCOTROUGH  --   --  10*    Estimated Creatinine Clearance: 64.2 mL/min (by C-G formula based on SCr of 0.45 mg/dL).    No Known Allergies  Thank you for allowing pharmacy to be a part of this patient's care.  Dolly Rias RPh 04/09/2017, 12:20 PM Pager [phone]

## 2017-04-09 NOTE — Progress Notes (Signed)
PROGRESS NOTE    Deanna Baldwin  ZSW:[id number] DOB: 11-24-1975 DOA: 04/08/2017 PCP: Deanna Cranker, DO    Brief Narrative:  Patient is a 42 year old female history of spastic paraplegia, secondary to multiple sclerosis, neurogenic bladder, status post urostomy and frequent UTIs with chronic constipation, hemorrhoids felt secondary to osteomyelitis on IV antibiotics. Presented with routine labs showing abnormal hemoglobin she was sent to emergency department for routine transfusion patient denies any shortness of breath or chest pain no recent fevers or chills otherwise she is at baseline with multiple chronic co morbidities.  Patient reports she started having vaginal bleeding again after no cycles for 5 years. Denies heavy vaginal bleeding, Per pelvic US in march 2018 was unremarkable.   reports her wounds have been healing.    Recently was admitted for sepsis due to osteomyelitis discharged to SNF on 22 March 2017.  During admission she was started of hydrotherapy ID has seen her in consult and recommended 8 weeks antibiotics on IV vancomycin and ceftriaxone p.o. Flagyl PICC line was placed Patient has known history of anemia of chronic disease during prior admission FOBT was positive she does have history of hemorrhoids she required 1 unit of packed red blood cells on prior admission with plan to transfuse for hemoglobin below 7. Reportedly in the past patient felt to be not a good candidate for colonoscopy secondary to severe sacral decub.   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  Patient is status post 2 units packed red blood cells.  Hemoglobin currently at 9.6.  Transfuse as needed.  2.  Hypocalcemia/hypomagnesemia Questionable etiology.  Corrected calcium is 7.2.  Patient with a low magnesium level at 1.6.  Patient also noted to have an elevated TSH.  Checking intact PTH, vitamin D levels.  We will give a dose of calcium gluconate 2 g IV x1.  Placed on Os-Cal with vitamin D 2 tablets 4 times daily.  IV fluids.  Started on Synthroid.  Follow.  3.  Dehydration IV fluids.  4.  Vitamin B12 deficiency Replete.  5.  Hypothyroidism Likely correlated with problem #2.  Check a total and free T3.  Check a free T4.  We will start patient on Synthroid 25 MCG's daily.  Will need repeat thyroid function studies done in about 4-6 weeks.  6.  Acute osteomyelitis of the left pelvic region and thigh/sacral decubitus ulcer stage III -Local wound care.  Continue IV Rocephin, metronidazole, IV vancomycin which was started during last hospitalization and is to continue until May 14, 2017..  7.  Hypotension Continue home dose of midodrine and uptitrate as needed.  8.  Spastic paraplegia Currently stable.  Continue Zanaflex, baclofen.  Outpatient follow-up.     DVT prophylaxis: scd Code Status: Full Family Communication: Updated patient.  No family at bedside. Disposition Plan: Assisted living facility once electrolytes have been corrected and workup completed with stabilization of  hemoglobin.   Consultants:   None  Procedures:   Transfuse 2 units packed red blood cells 04/08/2017  Antimicrobials:   IV vancomycin started during last hospitalization  IV Rocephin started during last hospitalization  Oral metronidazole started during last  hospitalization   Subjective: Patient in bed watching television.  Patient states he feels better than on admission.  No chest pain.  No shortness of breath.  Objective: Vitals:   04/09/17 0225 04/09/17 0522 04/09/17 0654 04/09/17 0800  BP: (!) 95/58 98/61 (!) 93/59 97/61  Pulse: 97 (!) 107 (!) 109 (!) 113  Resp: 16 16 17 18   Temp: 98.3 F (36.8 C) 98.7 F (37.1 C) 98.8 F (37.1 C) 99.1 F (37.3 C)  TempSrc: Oral Oral Oral Oral  SpO2: 99% 97% 96% 99%  Weight:      Height:        Intake/Output Summary (Last 24 hours) at 04/09/2017 1357 Last data filed at 04/09/2017 0520 Gross per 24 hour  Intake 1656.2 ml  Output -  Net 1656.2 ml   Filed Weights   04/09/17 0033  Weight: 43.5 kg (95 lb 14.4 oz)    Examination:  General exam: Appears calm and comfortable.  Pale Respiratory system: Clear to auscultation. Respiratory effort normal. Cardiovascular system: S1 & S2 heard, RRR. No JVD, murmurs, rubs, gallops or clicks. No pedal edema. Gastrointestinal system: Abdomen is nondistended, soft and nontender. No organomegaly or masses felt. Normal bowel sounds heard. Central nervous system: Alert and oriented. No focal neurological deficits. Extremities: Symmetric 5 x 5 power. Skin: Stage IV pressure injury of the sacrum/stage III of the right and left ischial tuberosity no rashes, lesions or ulcers Psychiatry: Judgement and insight appear normal. Mood & affect appropriate.     Data Reviewed: I have personally reviewed following labs and imaging studies  CBC: Recent Labs  Lab 04/08/17 1624 04/09/17 0759  WBC 8.1 7.5  NEUTROABS 5.6  --   HGB 5.8* 9.6*  HCT 18.0* 28.6*  MCV 90.9 86.9  PLT 602* 865*   Basic Metabolic Panel: Recent Labs  Lab 04/08/17 1624 04/09/17 0759  NA 143 144  K 4.1 4.3  CL 107 110  CO2 25 25  GLUCOSE 88 89  BUN 19 20  CREATININE 0.38* 0.45  CALCIUM 6.2* 6.4*  MG  --  1.6*  PHOS  --  6.0*   GFR: Estimated Creatinine Clearance: 64.2 mL/min  (by C-G formula based on SCr of 0.45 mg/dL). Liver Function Tests: Recent Labs  Lab 04/09/17 0759  AST 19  ALT 14  ALKPHOS 298*  BILITOT 0.5  PROT 6.5  ALBUMIN 3.0*   No results for input(s): LIPASE, AMYLASE in the last 168 hours. No results for input(s): AMMONIA in the last 168 hours. Coagulation Profile: No results for input(s): INR, PROTIME in the last 168 hours. Cardiac Enzymes: No results for input(s): CKTOTAL, CKMB, CKMBINDEX, TROPONINI in the last 168 hours. BNP (last 3 results) No results for input(s): PROBNP in the last 8760 hours. HbA1C: No results for input(s): HGBA1C in the last 72 hours. CBG: No results for input(s): GLUCAP in the last 168 hours. Lipid Profile: No results for input(s): CHOL, HDL, LDLCALC, TRIG, CHOLHDL, LDLDIRECT in the last 72 hours. Thyroid Function Tests: Recent Labs    04/09/17 0730  TSH 14.353*   Anemia Panel: Recent Labs    04/08/17 2150  VITAMINB12 690  FOLATE 30.0  FERRITIN 736*  TIBC 199*  IRON 51  RETICCTPCT 0.8   Sepsis  Labs: No results for input(s): PROCALCITON, LATICACIDVEN in the last 168 hours.  No results found for this or any previous visit (from the past 240 hour(s)).       Radiology Studies: No results found.      Scheduled Meds: . baclofen  20 mg Oral QID  . bisacodyl  5 mg Oral Daily  . calcium-vitamin D  2 tablet Oral QID  . cholecalciferol  1,000 Units Oral Daily  . famotidine  20 mg Oral q morning - 10a  . [START ON 04/10/2017] ferrous sulfate  325 mg Oral Q breakfast  . levothyroxine  25 mcg Oral QAC breakfast  . metroNIDAZOLE  500 mg Oral TID  . midodrine  2.5 mg Oral TID WC  . polyethylene glycol  17 g Oral Daily  . sodium chloride flush  3 mL Intravenous Q12H  . tiZANidine  2 mg Oral TID   Continuous Infusions: . sodium chloride 250 mL (04/09/17 0525)  . cefTRIAXone (ROCEPHIN)  IV Stopped (04/09/17 1115)  . vancomycin 1,000 mg (04/09/17 1341)     LOS: 0 days    Time spent: 35  minutes    Deanna Seal, MD [facility] Pager [phone]  If 7PM-7AM, please contact night-coverage [email or web address] Password [email or web address] 04/09/2017, 1:57 PM

## 2017-04-10 DIAGNOSIS — E43 Unspecified severe protein-calorie malnutrition: Secondary | ICD-10-CM | POA: Diagnosis not present

## 2017-04-10 DIAGNOSIS — E039 Hypothyroidism, unspecified: Secondary | ICD-10-CM | POA: Diagnosis not present

## 2017-04-10 DIAGNOSIS — E538 Deficiency of other specified B group vitamins: Secondary | ICD-10-CM | POA: Diagnosis not present

## 2017-04-10 DIAGNOSIS — D638 Anemia in other chronic diseases classified elsewhere: Secondary | ICD-10-CM | POA: Diagnosis not present

## 2017-04-10 DIAGNOSIS — L89154 Pressure ulcer of sacral region, stage 4: Secondary | ICD-10-CM | POA: Diagnosis not present

## 2017-04-10 DIAGNOSIS — I959 Hypotension, unspecified: Secondary | ICD-10-CM

## 2017-04-10 DIAGNOSIS — D649 Anemia, unspecified: Secondary | ICD-10-CM | POA: Diagnosis not present

## 2017-04-10 DIAGNOSIS — G35 Multiple sclerosis: Secondary | ICD-10-CM | POA: Diagnosis not present

## 2017-04-10 DIAGNOSIS — M86152 Other acute osteomyelitis, left femur: Secondary | ICD-10-CM | POA: Diagnosis not present

## 2017-04-10 DIAGNOSIS — G8220 Paraplegia, unspecified: Secondary | ICD-10-CM | POA: Diagnosis not present

## 2017-04-10 DIAGNOSIS — E860 Dehydration: Secondary | ICD-10-CM | POA: Diagnosis not present

## 2017-04-10 LAB — COMPREHENSIVE METABOLIC PANEL
ALK PHOS: 305 U/L — AB (ref 38–126)
ALT: 14 U/L (ref 14–54)
AST: 20 U/L (ref 15–41)
Albumin: 2.9 g/dL — ABNORMAL LOW (ref 3.5–5.0)
Anion gap: 9 (ref 5–15)
BUN: 19 mg/dL (ref 6–20)
CALCIUM: 7.6 mg/dL — AB (ref 8.9–10.3)
CHLORIDE: 107 mmol/L (ref 101–111)
CO2: 26 mmol/L (ref 22–32)
CREATININE: 0.45 mg/dL (ref 0.44–1.00)
Glucose, Bld: 98 mg/dL (ref 65–99)
Potassium: 3.8 mmol/L (ref 3.5–5.1)
Sodium: 142 mmol/L (ref 135–145)
Total Bilirubin: 0.5 mg/dL (ref 0.3–1.2)
Total Protein: 6.5 g/dL (ref 6.5–8.1)

## 2017-04-10 LAB — PTH, INTACT AND CALCIUM
CALCIUM TOTAL (PTH): 6.3 mg/dL — AB (ref 8.7–10.2)
PTH: 13 pg/mL — AB (ref 15–65)

## 2017-04-10 LAB — CBC
HEMATOCRIT: 28.8 % — AB (ref 36.0–46.0)
HEMOGLOBIN: 9.5 g/dL — AB (ref 12.0–15.0)
MCH: 29.2 pg (ref 26.0–34.0)
MCHC: 33 g/dL (ref 30.0–36.0)
MCV: 88.6 fL (ref 78.0–100.0)
PLATELETS: 467 10*3/uL — AB (ref 150–400)
RBC: 3.25 MIL/uL — AB (ref 3.87–5.11)
RDW: 17.9 % — ABNORMAL HIGH (ref 11.5–15.5)
WBC: 9.4 10*3/uL (ref 4.0–10.5)

## 2017-04-10 LAB — T4, FREE: Free T4: 0.62 ng/dL (ref 0.61–1.12)

## 2017-04-10 LAB — MAGNESIUM: Magnesium: 2.3 mg/dL (ref 1.7–2.4)

## 2017-04-10 LAB — PHOSPHORUS: Phosphorus: 4.6 mg/dL (ref 2.5–4.6)

## 2017-04-10 MED ORDER — PROBIOTIC 250 MG PO CAPS: ORAL_CAPSULE | Freq: Every day | ORAL | Status: DC

## 2017-04-10 MED ORDER — RISAQUAD PO CAPS
1.0000 | ORAL_CAPSULE | Freq: Every day | ORAL | Status: DC
  Administered 2017-04-10 – 2017-04-11 (×2): 1 via ORAL
  Filled 2017-04-10 (×2): qty 1

## 2017-04-10 NOTE — Progress Notes (Signed)
PROGRESS NOTE    Deanna Baldwin  JYN:[id number] DOB: [birth year] DOA: 04/08/2017 PCP: [name], DO    Brief Narrative:  Patient is a 42 year old female history of spastic paraplegia, secondary to multiple sclerosis, neurogenic bladder, status post urostomy and frequent UTIs with chronic constipation, hemorrhoids felt secondary to osteomyelitis on IV antibiotics. Presented with routine labs showing abnormal hemoglobin she was sent to emergency department for routine transfusion patient denies any shortness of breath or chest pain no recent fevers or chills otherwise she is at baseline with multiple chronic co morbidities.  Patient reports she started having vaginal bleeding again after no cycles for 5 years. Denies heavy vaginal bleeding, Per pelvic US in march 2018 was unremarkable.   reports her wounds have been healing.    Recently was admitted for sepsis due to osteomyelitis discharged to SNF on 22 March 2017.  During admission she was started of hydrotherapy ID has seen her in consult and recommended 8 weeks antibiotics on IV vancomycin and ceftriaxone p.o. Flagyl PICC line was placed Patient has known history of anemia of chronic disease during prior admission FOBT was positive she does have history of hemorrhoids she required 1 unit of packed red blood cells on prior admission with plan to transfuse for hemoglobin below 7. Reportedly in the past patient felt to be not a good candidate for colonoscopy secondary to severe sacral decub.   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  Patient is status post 2 units packed red blood cells.  Hemoglobin currently at 9.5.  Transfuse as needed.  2.  Hypocalcemia/hypomagnesemia Questionable etiology.  Corrected calcium is 8.48.  Patient with a low magnesium level at 1.6 yesterday currently at 2.3.  Patient noted to have elevated TSH and started on Synthroid.  Intact PTH 13, calcium of 6.3.  25 hydroxy vitamin D levels pending.  Patient status post IV calcium gluconate.  Continue Os-Cal with vitamin D 4 times daily.  Continue Synthroid.  Follow.    3.  Dehydration Improved with hydration.  Saline lock IV fluids.    4.  Vitamin B12 deficiency Replete.  5.  Hypothyroidism Likely correlated with problem #2.  TSH was 14.353.  Free T4 at 0.62.  T3 pending.  Patient started on Synthroid 25 MCG's daily.  Will need repeat thyroid function studies done in about 4-6 weeks.  Will likely need outpatient follow-up with PCP versus endocrinologist.    6.  Acute osteomyelitis of the left pelvic region and thigh/sacral decubitus ulcer stage III -Local wound care.   Continue IV Rocephin, metronidazole, IV vancomycin which was started during last hospitalization and is to continue through April 2019.    7.  Hypotension Blood pressure stable with systolics in the mid 86V.  Patient asymptomatic.  Continue current dose of midodrine.    8.  Spastic paraplegia Stable.  Continue Zanaflex, baclofen.  Outpatient follow-up.  DVT prophylaxis: scd Code Status: Full Family Communication: Updated patient.  No family at bedside. Disposition Plan: Assisted living facility once electrolytes have been corrected and workup completed with stabilization of hemoglobin hopefully tomorrow.   Consultants:   None  Procedures:   Transfused 2  units packed red blood cells 04/08/2017  Antimicrobials:   IV vancomycin started during last hospitalization  IV Rocephin started during last hospitalization  Oral metronidazole started during last hospitalization   Subjective: Patient states she is feeling better than on admission.  Denies any dizziness.  No nausea or vomiting.  No chest pain.  No shortness of breath.   Objective: Vitals:   04/09/17 1838 04/09/17 2206 04/10/17 0504 04/10/17 1400  BP: (!) 93/56 (!) 96/58 (!) 95/53 (!) 98/53  Pulse: 95 (!) 108 (!) 108 (!) 108  Resp: 18 18 18 18   Temp: 98 F (36.7 C) 99.1 F (37.3 C) 98.6 F (37 C) 98.4 F (36.9 C)  TempSrc: Oral Oral Oral Oral  SpO2: 100% 98% 98% 98%  Weight:      Height:        Intake/Output Summary (Last 24 hours) at 04/10/2017 1730 Last data filed at 04/10/2017 1300 Gross per 24 hour  Intake 671.67 ml  Output 1500 ml  Net -828.33 ml   Filed Weights   04/09/17 0033  Weight: 43.5 kg (95 lb 14.4 oz)    Examination:  General exam: NAD Respiratory system: Clear to auscultation.  No wheezes, no crackles, no rhonchi.  Respiratory effort normal. Cardiovascular system: The rate and rhythm no murmurs rubs or gallops.  No JVD.  No lower extremity edema. Gastrointestinal system: Abdomen is soft, nontender, nondistended, positive bowel sounds.  No rebound.  No guarding.   Central nervous system: Alert and oriented. No focal neurological deficits. Extremities: Symmetric 5 x 5 power. Skin: Stage IV pressure injury of the sacrum/stage III of the right and left ischial tuberosity no rashes, lesions or ulcers Psychiatry: Judgement and insight appear normal. Mood & affect appropriate.     Data Reviewed: I have personally reviewed following labs and imaging studies  CBC: Recent Labs  Lab 04/08/17 1624 04/09/17 0759 04/10/17 0536  WBC 8.1 7.5 9.4  NEUTROABS 5.6  --   --   HGB 5.8* 9.6* 9.5*  HCT 18.0* 28.6* 28.8*  MCV 90.9 86.9 88.6  PLT 602* 469* 467*     Basic Metabolic Panel: Recent Labs  Lab 04/08/17 1624 04/09/17 0759 04/09/17 0912 04/10/17 0536  NA 143 144  --  142  K 4.1 4.3  --  3.8  CL 107 110  --  107  CO2 25 25  --  26  GLUCOSE 88 89  --  98  BUN 19 20  --  19  CREATININE 0.38* 0.45  --  0.45  CALCIUM 6.2* 6.4* 6.3* 7.6*  MG  --  1.6*  --  2.3  PHOS  --  6.0*  --  4.6   GFR: Estimated Creatinine Clearance: 64.2 mL/min (by C-G formula based on SCr of 0.45 mg/dL). Liver Function Tests: Recent Labs  Lab 04/09/17 0759 04/10/17 0536  AST 19 20  ALT 14 14  ALKPHOS 298* 305*  BILITOT 0.5 0.5  PROT 6.5 6.5  ALBUMIN 3.0* 2.9*   No results for input(s): LIPASE, AMYLASE in the last 168 hours. No results for input(s): AMMONIA in the last 168 hours. Coagulation Profile: No results for input(s): INR, PROTIME in the last 168 hours. Cardiac Enzymes: No results for  input(s): CKTOTAL, CKMB, CKMBINDEX, TROPONINI in the last 168 hours. BNP (last 3 results) No results for input(s): PROBNP in the last 8760 hours. HbA1C: No results for input(s): HGBA1C in the last 72 hours. CBG: No results for input(s): GLUCAP in the last 168 hours. Lipid Profile: No results for input(s): CHOL, HDL, LDLCALC, TRIG, CHOLHDL, LDLDIRECT in the last 72 hours. Thyroid Function Tests: Recent Labs    04/09/17 0730 04/09/17 1605  TSH 14.353*  --   FREET4  --  0.62   Anemia Panel: Recent Labs    04/08/17 2150  VITAMINB12 690  FOLATE 30.0  FERRITIN 736*  TIBC 199*  IRON 51  RETICCTPCT 0.8   Sepsis Labs: No results for input(s): PROCALCITON, LATICACIDVEN in the last 168 hours.  No results found for this or any previous visit (from the past 240 hour(s)).       Radiology Studies: No results found.      Scheduled Meds: . acidophilus  1 capsule Oral Daily  . baclofen  20 mg Oral QID  . bisacodyl  5 mg Oral Daily  . calcium-vitamin D  2 tablet Oral QID  . cholecalciferol  1,000 Units Oral Daily  . famotidine  20 mg Oral q  morning - 10a  . ferrous sulfate  325 mg Oral Q breakfast  . levothyroxine  25 mcg Oral QAC breakfast  . metroNIDAZOLE  500 mg Oral TID  . midodrine  2.5 mg Oral TID WC  . polyethylene glycol  17 g Oral Daily  . sodium chloride flush  3 mL Intravenous Q12H  . tiZANidine  2 mg Oral TID   Continuous Infusions: . sodium chloride 250 mL (04/09/17 0525)  . cefTRIAXone (ROCEPHIN)  IV Stopped (04/10/17 1054)  . vancomycin Stopped (04/10/17 1505)     LOS: 0 days    Time spent: 35 minutes    Irine Seal, MD [facility] Pager [phone]  If 7PM-7AM, please contact night-coverage [email or web address] Password [email or web address] 04/10/2017, 5:30 PM

## 2017-04-10 NOTE — Progress Notes (Signed)
OT Cancellation Note  Patient Details Name: Deanna Baldwin MRN: [id number] DOB: November 05, 1975   Cancelled Treatment:    Reason Eval/Treat Not Completed: Other (comment). Pt is from snf and plan is to return to snf. Will defer any OT needs to SNF. If OT eval needed, please contact acute care rehab department [phone]. Thanks.  [place], OT/L  [phone] 04/10/2017 04/10/2017, 3:41 PM

## 2017-04-10 NOTE — Plan of Care (Signed)
  Safety: Ability to remain free from injury will improve 04/10/2017 2104 - Progressing by Mickie Kay, RN

## 2017-04-10 NOTE — Progress Notes (Addendum)
CSW consulted to assist with disposition as pt is admitted from facility- is resident of [facility] Met with pt at bedside- she reports she has lived there for 13 months, is satisfied with care and "long term goal is to be able to live in my own apartment again." States she moved to [place] when her care at home became too much to manage on her own (states she had bed sores). Uses a wheelchair to ambulate. Pt reports she has good family support- has a twin sister in [place] and a sister and brother-in-law in [place]. Her 78 yr old son lives with his father in [place] but they have a good relationship. Pt states her sister Deanna Baldwin is her POA "but not my guardian, I am my own guardian." Pt reports her DC plan will be to return to [place]. Is currently admitted under observation for anemia, also receiving IV antibiotics for sepsis (has been since [place] hospital admission in February 2018.  CSW notified facility of status. See below for full assessment completed 03/18/17 at [facility].  Will follow and assist with transition back to [facility] [place].  Sharren Bridge, MSW, LCSW Clinical Social Work 04/10/2017 [phone] weekend coverage   Clinical Social Work Assessment  Patient Details  Name: Deanna Baldwin MRN: [id number] Date of Birth: 1975-07-16  Date of referral:  03/18/17               Reason for consult:  Discharge Planning                           Permission sought to share information with:  Facility Sport and exercise psychologist, Family Supports Permission granted to share information::  Yes, Verbal Permission Granted             Name::     Paediatric nurse::  SNFs             Relationship::  Aunt             Contact Information:  [phone]  Housing/Transportation Living arrangements for the past 2 months:  [place] of Information:  Other (Comment Required) Patient Interpreter Needed:   None Criminal Activity/Legal Involvement Pertinent to Current Situation/Hospitalization:  No - Comment as needed Significant Relationships:  Siblings, Other Family Members Lives with:  Facility Resident Do you feel safe going back to the place where you live?  Yes Need for family participation in patient care:  Yes (Comment)  Care giving concerns:  CSW received consult regarding discharge plan. CSW spoke with patient's Aunt. She stated patient resides at [facility] and will return there at discharge. CSW to continue to follow and assist with discharge planning needs.   Social Worker assessment / plan:  CSW spoke with patient's Aunt regarding return to snf.  Employment status:  Disabled (Comment on whether or not currently receiving Disability) Insurance information:  Managed Medicare PT Recommendations:  Not assessed at this time Information / Referral to community resources:  [place]  Patient/Family's Response to care:  Patient's Aunt reported agreement with dc plan and patient will require PTAR.   Patient/Family's Understanding of and Emotional Response to Diagnosis, Current Treatment, and Prognosis:  Patient/family is realistic regarding therapy needs and expressed being hopeful for SNF placement. Patient's Aunt expressed understanding of CSW role and discharge process as well as medical condition. No  questions/concerns about plan or treatment.    Emotional Assessment Appearance:  Appears older than stated age Attitude/Demeanor/Rapport:  Unable to Assess Affect (typically observed):  Unable to Assess Orientation:  Oriented to Self, Oriented to Place Alcohol / Substance use:  Not Applicable Psych involvement (Current and /or in the community):  No (Comment)  Discharge Needs  Concerns to be addressed:  Care Coordination Readmission within the last 30 days:  No Current discharge risk:  None Barriers to Discharge:  Continued Medical Work up   [employer], [place] 03/18/2017, 1:45 PM

## 2017-04-11 ENCOUNTER — Encounter (HOSPITAL_COMMUNITY): Payer: Medicare Other

## 2017-04-11 DIAGNOSIS — D638 Anemia in other chronic diseases classified elsewhere: Secondary | ICD-10-CM | POA: Diagnosis not present

## 2017-04-11 DIAGNOSIS — M86152 Other acute osteomyelitis, left femur: Secondary | ICD-10-CM | POA: Diagnosis not present

## 2017-04-11 DIAGNOSIS — E860 Dehydration: Secondary | ICD-10-CM | POA: Diagnosis not present

## 2017-04-11 DIAGNOSIS — D649 Anemia, unspecified: Secondary | ICD-10-CM | POA: Diagnosis not present

## 2017-04-11 LAB — BASIC METABOLIC PANEL
ANION GAP: 9 (ref 5–15)
BUN: 20 mg/dL (ref 6–20)
CALCIUM: 8.1 mg/dL — AB (ref 8.9–10.3)
CO2: 26 mmol/L (ref 22–32)
Chloride: 106 mmol/L (ref 101–111)
Creatinine, Ser: 0.49 mg/dL (ref 0.44–1.00)
GFR calc Af Amer: >60 mL/min (ref >60)
GLUCOSE: 101 mg/dL — AB (ref 65–99)
POTASSIUM: 3.9 mmol/L (ref 3.5–5.1)
Sodium: 141 mmol/L (ref 135–145)

## 2017-04-11 LAB — CBC
HEMATOCRIT: 29.9 % — AB (ref 36.0–46.0)
Hemoglobin: 9.7 g/dL — ABNORMAL LOW (ref 12.0–15.0)
MCH: 29.5 pg (ref 26.0–34.0)
MCHC: 32.4 g/dL (ref 30.0–36.0)
MCV: 90.9 fL (ref 78.0–100.0)
PLATELETS: 444 10*3/uL — AB (ref 150–400)
RBC: 3.29 MIL/uL — ABNORMAL LOW (ref 3.87–5.11)
RDW: 17.8 % — AB (ref 11.5–15.5)
WBC: 9.3 10*3/uL (ref 4.0–10.5)

## 2017-04-11 LAB — T3, FREE: T3 FREE: 1.7 pg/mL — AB (ref 2.0–4.4)

## 2017-04-11 LAB — T3: T3, Total: 98 ng/dL (ref 71–180)

## 2017-04-11 LAB — VITAMIN D 25 HYDROXY (VIT D DEFICIENCY, FRACTURES): VIT D 25 HYDROXY: 30.7 ng/mL (ref 30.0–100.0)

## 2017-04-11 MED ORDER — COLLAGENASE 250 UNIT/GM EX OINT: TOPICAL_OINTMENT | Freq: Every day | CUTANEOUS | 0 refills | Status: DC

## 2017-04-11 MED ORDER — IBUPROFEN 200 MG PO TABS: 200.0000 mg | ORAL_TABLET | Freq: Three times a day (TID) | ORAL | 0 refills | Status: DC | PRN

## 2017-04-11 MED ORDER — ADULT MULTIVITAMIN W/MINERALS CH
1.0000 | ORAL_TABLET | Freq: Every day | ORAL | Status: DC
  Administered 2017-04-11: 1 via ORAL
  Filled 2017-04-11: qty 1

## 2017-04-11 MED ORDER — HYDROCODONE-ACETAMINOPHEN 5-325 MG PO TABS: 1.0000 | ORAL_TABLET | ORAL | 0 refills | Status: DC | PRN

## 2017-04-11 MED ORDER — CALCIUM CARBONATE-VITAMIN D 500-200 MG-UNIT PO TABS: 2.0000 | ORAL_TABLET | Freq: Four times a day (QID) | ORAL | Status: DC

## 2017-04-11 MED ORDER — LEVOTHYROXINE SODIUM 25 MCG PO TABS: 25.0000 ug | ORAL_TABLET | Freq: Every day | ORAL | 0 refills | Status: DC

## 2017-04-11 MED ORDER — ENSURE ENLIVE PO LIQD
237.0000 mL | Freq: Three times a day (TID) | ORAL | Status: DC
  Administered 2017-04-11: 237 mL via ORAL

## 2017-04-11 MED ORDER — COLLAGENASE 250 UNIT/GM EX OINT
TOPICAL_OINTMENT | Freq: Every day | CUTANEOUS | Status: DC
  Administered 2017-04-11: 15:00:00 via TOPICAL
  Filled 2017-04-11: qty 90

## 2017-04-11 MED ORDER — ACETAMINOPHEN 325 MG PO TABS: 650.0000 mg | ORAL_TABLET | Freq: Four times a day (QID) | ORAL | Status: AC | PRN

## 2017-04-11 NOTE — Consult Note (Signed)
[facility] Nurse wound consult note Reason for Consult: sacrum Patient very conversant this afternoon, much more like her self than the last time I saw her on 03/17/17 Wound type: Stage 4 pressure injuries Sacrum and bilateral ischium Pressure Injury POA: Yes Measurement: Sacrum: 4cm x 5cm x 0.1cm; 100% clean and pink, moist Right ischium: 4cm x 2cm x 0.1cm: 95% clean, 5% fibrinous material Left ischium that has previously been two areas is now one; 9cm x 4cm x 1cm; 50% grey/yellow non viable tissue/50% pale pink, moist Wound bed:see above Drainage (amount, consistency, odor) non purulent, no odor, minimal Periwound: intact  Dressing procedure/placement/frequency: Add enzymatic debridement ointment to the left ischial wound. Foam to protect for moist wound healing for the right ischium and sacral wound.  Needs low air loss mattress in SNF for pressure redistribution ( A must with her contractures and 3 pressure injuries) Maximize nutrition for wound healing, patient and I discussed this.  Discussed POC with patient and bedside nurse.  Re consult if needed, will not follow at this time. Thanks  Lorali Khamis [employer], RN,CWOCN, CNS, [place] [phone])

## 2017-04-11 NOTE — Progress Notes (Signed)
Patient returning to [facility] SNF. Facility aware of patient's discharge and confirmed patient's ability to return. PTAR contacted, patient/patient's family notified. Patient's RN can call report to [phone], packet complete. CSW signing off, no other needs identified at this time.  Abundio Miu, [place] Social Worker [facility] Cell#: [phone]

## 2017-04-11 NOTE — Progress Notes (Signed)
PT Cancellation Note  Patient Details Name: Deanna Baldwin MRN: [id number] DOB: 1975-11-30   Cancelled Treatment:    Reason Eval/Treat Not Completed: Order received. Chart reviewed. PT screened pt, no needs identified, will sign off.  Pt is from SNF and bedbound at baseline.  Pt uses lift for OOB at SNF. Pt is at her baseline and is not appropriate for acute PT services.    Weston Anna, MPT Pager: [phone]

## 2017-04-11 NOTE — Discharge Summary (Signed)
Physician Discharge Summary  Deanna Baldwin:[id number] DOB: 1975/11/18 DOA: 04/08/2017  PCP: Deanna Cranker, DO  Admit date: 04/08/2017 Discharge date: 04/11/2017  Time spent: 60 minutes  Recommendations for Outpatient Follow-up:  1. Follow-up with MD at skilled nursing facility.  On follow-up patient will need a basic metabolic profile done as well as a magnesium level and a CBC done in 1 week to follow-up on electrolytes and renal function and hemoglobin.  Patient will need repeat thyroid function studies done in about 4-6 weeks to follow-up on presumed hypothyroidism.  Patient will benefit from referral to endocrinology for further evaluation of hypothyroidism.   Discharge Diagnoses:  Principal Problem:   Anemia Active Problems:   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)   Protein-calorie malnutrition, severe (HCC)   Anemia of chronic disease   Relapsing remitting multiple sclerosis (HCC)   Pressure ulcer, stage 4 (HCC)   Vitamin B 12 deficiency   Hypotension   Dehydration   Acute osteomyelitis of left pelvic region and thigh ([place])   Osteomyelitis ([place])   Hypocalcemia   Hypothyroidism   Discharge Condition: Stable and improved  Diet recommendation: Regular  Filed Weights   04/09/17 0033  Weight: 43.5 kg (95 lb 14.4 oz)    History of present illness:  Per Dr. Jake Samples Deanna Baldwin is a 42 y.o. female with medical history significant ofspastic paraplegia secondary to MS, neurogenic bladder s/p urostomy with frequent UTI, chronic decub ulcers, chronic constipation, hemorrhoids history of osteomyelitis on  IV antibiotics    Presented with routine labs showing abnormal hemoglobin she was sent to emergency department for routine transfusion patient denied any shortness of breath or chest pain no recent fevers or chills otherwise she was at baseline with multiple chronic co morbidities.  Patient reported she started having vaginal bleeding again after no  cycles for 5 years. Denied heavy vaginal bleeding, Per pelvic US in march 2018 was unremarkable.   reports her wounds have been healing.    Recently was admitted for sepsis due to osteomyelitis discharged to SNF on 22 March 2017.  During admission she was started of hydrotherapy ID has seen her in consult and recommended 8 weeks antibiotics on IV vancomycin and ceftriaxone p.o. Flagyl PICC line was placed Patient has known history of anemia of chronic disease during prior admission FOBT was positive she does have history of hemorrhoids she required 1 unit of packed red blood cells on prior admission with plan to transfuse for hemoglobin below 7. Reportedly in the past patient felt to be not a good candidate for colonoscopy secondary to severe sacral decub.   Significant initial  Findings: WBC 8.1 hemoglobin 5.8 platelets 602 Sodium 143 K4.1 creatinine 0.38   while in ER: Hemoglobin was found to be 5.8 Ordered 2 units of packed red blood cells and calcium was repleted  Hospital Course:  #1 anemia Patient with history of heme positive stool noted to have a history of hemorrhoids. Patient was admitted with a hemoglobin of 5.8 to be transfused and patient noted to have a history of anemia and being transfused as needed.  Patient had no overt bleeding.  Patient was transfused 2 units of packed red blood cells with appropriate response.  Hemoglobin stabilized at 9.7 by day of discharge.  Patient will be discharged in stable and improved condition.   2.  Hypocalcemia/hypomagnesemia Patient on admission was noted to be hypocalcemic and hypomagnesemic on admission.  Questionable etiology.  Patient was admitted workup was done and patient hydrated  and calcium and magnesium repleted during the hospitalization.  Patient improved clinically and remained asymptomatic.  Corrected calcium was 8.98 by day of discharge.  Patient with a low magnesium level at 1.6  which was repleted and was 2.3 by day of  discharge.  Patient noted to have elevated TSH and started on Synthroid.  Intact PTH 13, calcium of 6.3.  25 hydroxy vitamin D levels pending on day of discharge.  Patient status post IV calcium gluconate x2 during the hospitalization.  Patient was started on Os-Cal with vitamin D 4 times daily which will be discharged home on.  Patient will also be discharged on Synthroid.  Outpatient follow-up.  3.  Dehydration Patient hydrated IV fluids and was euvolemic by day of discharge.   4.  Vitamin B12 deficiency Repleted.  5.  Hypothyroidism Likely correlated with problem #2.  TSH was 14.353.  Free T4 at 0.62.  T3 was 98, free T3 was 1.7.  Patient was started on Synthroid 25 MCG's daily.  Will need repeat thyroid function studies done in about 4-6 weeks.  Will likely need outpatient follow-up with PCP versus endocrinologist.    6.  Acute osteomyelitis of the left pelvic region and thigh/sacral decubitus ulcer stage III -Local wound care was done during the hospitalization.  Wound care nurse assessed the patient and dressing change and recommendations made which will be sent to the facility..   Patient was maintained on IV antibiotics that she was on prior to admission of IV Rocephin, metronidazole, IV vancomycin which was started during last hospitalization and is to continue through April 13th 2019.    7.    Chronic hypotension Blood pressure stable with systolics in the mid 62X.  Patient remained asymptomatic.  Continued on home regimen of midodrine.    8.  Spastic paraplegia Stable.  Continued on home regimen of Zanaflex, baclofen.  Outpatient follow-up.  9.  Severe protein calorie malnutrition Patient placed on nutritional supplementation.    Procedures:  Transfused 2 units packed red blood cells 04/08/2017      Consultations:  Wound Care RN: Para March, RN 04/11/2017  Discharge Exam: Vitals:   04/11/17 0520 04/11/17 1400  BP: (!) 87/50 (!) 88/52  Pulse: (!) 109 (!) 101   Resp: 18 18  Temp: 98.6 F (37 C) 99 F (37.2 C)  SpO2: 97% 98%    General: NAD Cardiovascular: RRR Respiratory: CTAB  Discharge Instructions   Discharge Instructions    Diet general   Complete by:  As directed    Discharge instructions   Complete by:  As directed    Dressings/placement/frequency: Add enzymatic debridement ointment to the left ischial wound.  Foam to protect for moist wound healing for the right ischium and sacral wound.  Needs low air loss mattress insulin for pressure redistribution (must with her contractures and 3 pressure injuries) maximize nutrition for wound healing.   Increase activity slowly   Complete by:  As directed      Allergies as of 04/11/2017   No Known Allergies     Medication List    STOP taking these medications   calcium carbonate 750 MG chewable tablet Commonly known as:  TUMS EX     TAKE these medications   acetaminophen 325 MG tablet Commonly known as:  TYLENOL Take 2 tablets (650 mg total) by mouth every 6 (six) hours as needed for mild pain (or Fever >/= 101). What changed:    medication strength  how much to take  when  to take this  reasons to take this   baclofen 20 MG tablet Commonly known as:  LIORESAL Take 20 mg by mouth 4 (four) times daily.   bisacodyl 5 MG EC tablet Commonly known as:  DULCOLAX Take 5 mg by mouth daily. What changed:  Another medication with the same name was removed. Continue taking this medication, and follow the directions you see here.   calcium-vitamin D 500-200 MG-UNIT tablet Commonly known as:  OSCAL WITH D Take 2 tablets by mouth 4 (four) times daily.   cefTRIAXone IVPB Commonly known as:  ROCEPHIN Inject 2 g into the vein daily. Indication: sacral osteomyelitis Last Day of Therapy:  05/14/17 Labs - Once weekly:  CBC/D and BMP, Labs - Every other week:  ESR and CRP   Cholecalciferol 1000 units tablet Take 2,000 Units by mouth daily.   collagenase ointment Commonly known  as:  SANTYL Apply topically daily.   DECUBI-VITE Caps Take 1 capsule by mouth daily.   diclofenac sodium 1 % Gel Commonly known as:  VOLTAREN Apply 2 g topically 3 (three) times daily. Apply to Left shoulder   famotidine 20 MG tablet Commonly known as:  PEPCID Take 20 mg by mouth every morning.   feeding supplement (PRO-STAT SUGAR FREE 64) Liqd Take 30 mLs by mouth 2 (two) times daily.   ferrous sulfate 325 (65 FE) MG tablet Take 325 mg by mouth daily with breakfast.   HYDROcodone-acetaminophen 5-325 MG tablet Commonly known as:  NORCO/VICODIN Take 1-2 tablets by mouth every 4 (four) hours as needed for moderate pain.   ibuprofen 200 MG tablet Commonly known as:  ADVIL,MOTRIN Take 1 tablet (200 mg total) by mouth every 8 (eight) hours as needed for fever, headache or moderate pain. What changed:    when to take this  reasons to take this   levothyroxine 25 MCG tablet Commonly known as:  SYNTHROID, LEVOTHROID Take 1 tablet (25 mcg total) by mouth daily before breakfast. Start taking on:  04/12/2017   metroNIDAZOLE 500 MG tablet Commonly known as:  FLAGYL Take 1 tablet (500 mg total) by mouth 3 (three) times daily.   midodrine 2.5 MG tablet Commonly known as:  PROAMATINE Take 1 tablet (2.5 mg total) by mouth 3 (three) times daily with meals.   nutrition supplement (JUVEN) Pack Take 1 packet by mouth daily.   ondansetron 4 MG tablet Commonly known as:  ZOFRAN Take 4 mg by mouth every 6 (six) hours as needed for nausea or vomiting.   polyethylene glycol packet Commonly known as:  MIRALAX / GLYCOLAX Take 17 g by mouth daily.   PROBIOTIC PO Take 1 tablet by mouth daily.   tizanidine 2 MG capsule Commonly known as:  ZANAFLEX Take 2 mg by mouth 3 (three) times daily.   UNABLE TO FIND PROTEIN LIQUID-2 TIMES DAILY RELATED TO UNSPECIFIED BUTTOCK   UNABLE TO FIND HOUSE SUPPLEMENT-120 CC THREE TIMES DAILY   UNABLE TO FIND MAGIC CUP WITH MEALS 3 TIMES DAILY    vancomycin IVPB Inject 1,000 mg into the vein daily. Indication: sacral osteomyelitis Last Day of Therapy:  05/14/17 Labs - Sunday/Monday:  CBC/D, BMP, and vancomycin trough. Labs - Thursday:  BMP and vancomycin trough Labs - Every other week:  ESR and CRP   VITAMIN B-12 PO Take 1 tablet by mouth daily.   vitamin C 500 MG tablet Commonly known as:  ASCORBIC ACID Take 500 mg by mouth 2 (two) times daily.   Vitamin D (Ergocalciferol) 50000 units  Caps capsule Commonly known as:  DRISDOL Take 50,000 Units by mouth every 30 (thirty) days.      No Known Allergies Contact information for after-discharge care    Destination    [facility] Follow up.   Service:  Skilled Nursing Contact information: [address] [place] [phone]               The results of significant diagnostics from this hospitalization (including imaging, microbiology, ancillary and laboratory) are listed below for reference.    Significant Diagnostic Studies: Mr Pelvis Wo Contrast  Result Date: 03/17/2017 CLINICAL DATA:  Large decubitus ulcers EXAM: MRI PELVIS WITHOUT CONTRAST TECHNIQUE: Multiplanar multisequence MR imaging of the pelvis was performed. No intravenous contrast was administered. COMPARISON:  MRI 03/07/2016 FINDINGS: There is a sacral decubitus ulcer extending right down to the bone with underlying T2 signal abnormality in the S3 segment of the sacrum consistent with osteomyelitis. Decubitus ulcer over the left ischial tuberosity extending down to the bone. Abnormal T2 signal intensity in the bone suggesting osteomyelitis. No definite findings for septic arthritis or osteomyelitis involving the hips. 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 Edema like signal abnormality in the adductor muscles bilaterally, left greater than right suggesting myositis. Similar signal abnormality in the left thigh and hip muscles. No  definite findings for pyomyositis. No significant intrapelvic abnormalities. No intrapelvic abscess is identified. The SI joints are intact. IMPRESSION: 1. Deep sacral decubitus ulcer with underlying osteomyelitis. 2. Deep decubitus ulcer over the left ischial tuberosity with osteomyelitis. 3. Diffuse myositis without definite findings for pyomyositis or discrete drainable soft tissue abscess. 4. No definite findings for septic arthritis. 5. 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 6. No definite intrapelvic abscess. Electronically Signed   By: Marijo Sanes M.D.   On: 03/17/2017 21:06   Dg Chest Port 1 View  Result Date: 03/17/2017 CLINICAL DATA:  Sepsis. EXAM: PORTABLE CHEST 1 VIEW COMPARISON:  03/17/2017 FINDINGS: Cardiomediastinal silhouette is normal. Mediastinal contours appear intact. There is no evidence of pneumothorax. Stable hyperinflation of the lungs. Stable left hemidiaphragmatic juxta peaking. Bilateral apical pleural thickening. Osseous structures are without acute abnormality. Soft tissues are grossly normal. IMPRESSION: Stable hyperinflation of the lungs. Stable left hemidiaphragmatic juxta peaking which may be seen with scarring or sub pulmonic effusion. Electronically Signed   By: Fidela Salisbury M.D.   On: 03/17/2017 09:25   Dg Chest Port 1 View  Result Date: 03/17/2017 CLINICAL DATA:  Altered mental status, abdominal pain beginning last night. History of multiple sclerosis. EXAM: PORTABLE CHEST 1 VIEW COMPARISON:  Chest radiograph August 31, 2016 FINDINGS: Cardiomediastinal silhouette is normal. Elevated LEFT hemidiaphragm with juxta peaking. Hyperinflation. Apical pleural thickening. No pneumothorax. Osteopenia. Cachexia. IMPRESSION: LEFT lung base juxtapeaking seen with scarring or subpulmonic effusion. Stable hyperinflation. Electronically Signed   By: Elon Alas M.D.   On: 03/17/2017 06:27   US Abdomen Limited Ruq  Result Date: 03/17/2017 CLINICAL  DATA:  Elevated LFTs. EXAM: ULTRASOUND ABDOMEN LIMITED RIGHT UPPER QUADRANT COMPARISON:  09/06/2016. FINDINGS: Gallbladder: No gallstones or wall thickening visualized. No sonographic Murphy sign noted by sonographer. Common bile duct: Diameter: 1.1 mm Liver: No focal lesion identified. Within normal limits in parenchymal echogenicity. Portal vein is patent on color Doppler imaging with normal direction of blood flow towards the liver. IMPRESSION: No acute or focal abnormality identified. No gallstones or biliary distention. Liver appears normal. Electronically Signed   By: Marcello Moores  Register  On: 03/17/2017 10:06    Microbiology: No results found for this or any previous visit (from the past 240 hour(s)).   Labs: Basic Metabolic Panel: Recent Labs  Lab 04/08/17 1624 04/09/17 0759 04/09/17 0912 04/10/17 0536 04/11/17 0448  NA 143 144  --  142 141  K 4.1 4.3  --  3.8 3.9  CL 107 110  --  107 106  CO2 25 25  --  26 26  GLUCOSE 88 89  --  98 101*  BUN 19 20  --  19 20  CREATININE 0.38* 0.45  --  0.45 0.49  CALCIUM 6.2* 6.4* 6.3* 7.6* 8.1*  MG  --  1.6*  --  2.3  --   PHOS  --  6.0*  --  4.6  --    Liver Function Tests: Recent Labs  Lab 04/09/17 0759 04/10/17 0536  AST 19 20  ALT 14 14  ALKPHOS 298* 305*  BILITOT 0.5 0.5  PROT 6.5 6.5  ALBUMIN 3.0* 2.9*   No results for input(s): LIPASE, AMYLASE in the last 168 hours. No results for input(s): AMMONIA in the last 168 hours. CBC: Recent Labs  Lab 04/08/17 1624 04/09/17 0759 04/10/17 0536 04/11/17 0448  WBC 8.1 7.5 9.4 9.3  NEUTROABS 5.6  --   --   --   HGB 5.8* 9.6* 9.5* 9.7*  HCT 18.0* 28.6* 28.8* 29.9*  MCV 90.9 86.9 88.6 90.9  PLT 602* 469* 467* 444*   Cardiac Enzymes: No results for input(s): CKTOTAL, CKMB, CKMBINDEX, TROPONINI in the last 168 hours. BNP: BNP (last 3 results) No results for input(s): BNP in the last 8760 hours.  ProBNP (last 3 results) No results for input(s): PROBNP in the last 8760  hours.  CBG: No results for input(s): GLUCAP in the last 168 hours.     Signed:  Irine Seal MD.  [facility] 04/11/2017, 4:03 PM

## 2017-04-11 NOTE — NC FL2 (Addendum)
[place] LEVEL OF CARE SCREENING TOOL     IDENTIFICATION  Patient Name: Deanna Baldwin Birthdate: 02-05-1975 Sex: female Admission Date (Current Location): 04/08/2017  [facility] and [place] Number:  Herbalist and Address:  [facility],  [place], [place]      Provider Number: [id number]  Attending Physician Name and Address:  Eugenie Filler, MD  Relative Name and Phone Number:  Westley Foots, [phone]    Current Level of Care: Hospital Recommended Level of Care: [place] Prior Approval Number:    Date Approved/Denied:   PASRR Number:    Discharge Plan: SNF    Current Diagnoses: Patient Active Problem List   Diagnosis Date Noted  . Hypothyroidism 04/09/2017  . Hypocalcemia 04/08/2017  . Anemia 04/08/2017  . Osteomyelitis ([place]) 03/19/2017  . Altered mental status   . Dehydration   . Acute osteomyelitis of left pelvic region and thigh ([place])   . SIRS (systemic inflammatory response syndrome) ([place]) 03/17/2017  . Paraplegia ([place]) 03/09/2017  . Chronic constipation 02/13/2017  . Elevated liver enzymes 02/13/2017  . Chronic pain 12/13/2016  . Leukocytosis 09/15/2016  . Hypotension 08/31/2016  . Vitamin B 12 deficiency 07/09/2016  . Pressure ulcer, stage 4 ([place]) 07/08/2016  . Sepsis ([place]) 07/07/2016  . Unintentional weight loss 07/06/2016  . Relapsing remitting multiple sclerosis ([place]) 06/08/2016  . Neurogenic bladder 04/09/2016  . Tachycardia 04/08/2016  . Anemia of chronic disease 04/06/2016  . Dysphagia, oral phase 03/17/2016  . Palliative care by specialist   . Protein-calorie malnutrition, severe ([place])   . Spastic 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 ([place]) 03/02/2016  . UTI (urinary tract infection) 03/02/2016  . Elevated liver function tests 03/02/2016    Orientation RESPIRATION BLADDER Height & Weight     Self, Place, Situation  Normal Urostomy, Incontinent Weight: 95 lb 14.4  oz (43.5 kg) Height:  5\' 9"  (175.3 cm)  BEHAVIORAL SYMPTOMS/MOOD NEUROLOGICAL BOWEL NUTRITION STATUS      Continent Diet(regular)  AMBULATORY STATUS COMMUNICATION OF NEEDS Skin   Total Care Verbally PU Stage and Appropriate Care(PressureInjuryStageIII-Fullthicknesstissueloss.Subcutaneousfatmaybevisiblebutbone,tendonormuscleareNOTexposed.Leftischialtuberosity  Location: Ischial tuberosity Location Orientation: Left Foam Dressing changed daily  )     PU Stage 3 Dressing: Daily(PressureInjuryStageIII-Fullthicknesstissueloss.Subcutaneousfatmaybevisiblebutbone,tendonormuscleareNOTexposed.rtischialtuberosity   Location: Ischial tuberosity Location Orientation: Right Foam Dressing changed daily) PU Stage 4 Dressing: Daily(PressureInjuryStageIV-Fullthicknesstissuelosswithexposedbone,tendonormuscle.Sacrum   Location: Sacrum Location Orientation: Medial  Foam Dressing chaned daily)   Needs Low air loss mattress for pressure redistribution               Personal Care Assistance Level of Assistance  Bathing, Feeding, Dressing Bathing Assistance: Maximum assistance Feeding assistance: Limited assistance Dressing Assistance: Maximum assistance     Functional Limitations Info  Sight, Hearing, Speech Sight Info: Adequate Hearing Info: Adequate Speech Info: Adequate    SPECIAL CARE FACTORS FREQUENCY                       Contractures      Additional Factors Info  Code Status, Allergies, Isolation Precautions Code Status Info: Full Code Allergies Info: NKA     Isolation Precautions Info: Contact Precautions  Infection: MRSA     Current Medications (04/11/2017):  This is the current hospital active medication list Current Facility-Administered Medications  Medication Dose Route Frequency Provider Last Rate Last Dose  . 0.9 %  sodium chloride infusion  250 mL Intravenous PRN Toy Baker, MD 10 mL/hr at 04/09/17  0525 250 mL at 04/09/17 0525  .  acetaminophen (TYLENOL) tablet 650 mg  650 mg Oral Q6H PRN Toy Baker, MD       Or  . acetaminophen (TYLENOL) suppository 650 mg  650 mg Rectal Q6H PRN Doutova, Anastassia, MD      . acidophilus (RISAQUAD) capsule 1 capsule  1 capsule Oral Daily Eugenie Filler, MD   1 capsule at 04/11/17 1007  . baclofen (LIORESAL) tablet 20 mg  20 mg Oral QID Toy Baker, MD   20 mg at 04/11/17 1324  . bisacodyl (DULCOLAX) EC tablet 5 mg  5 mg Oral Daily Doutova, Anastassia, MD   5 mg at 04/11/17 1007  . calcium-vitamin D (OSCAL WITH D) 500-200 MG-UNIT per tablet 2 tablet  2 tablet Oral QID Eugenie Filler, MD   2 tablet at 04/11/17 1324  . cefTRIAXone (ROCEPHIN) 2 g in sodium chloride 0.9 % 100 mL IVPB  2 g Intravenous Q24H Eugenie Filler, MD   Stopped at 04/11/17 1038  . cholecalciferol (VITAMIN D) tablet 1,000 Units  1,000 Units Oral Daily Eugenie Filler, MD   1,000 Units at 04/11/17 1006  . collagenase (SANTYL) ointment   Topical Daily Eugenie Filler, MD      . famotidine (PEPCID) tablet 20 mg  20 mg Oral q morning - 10a Doutova, Anastassia, MD   20 mg at 04/11/17 1007  . feeding supplement (ENSURE ENLIVE) (ENSURE ENLIVE) liquid 237 mL  237 mL Oral TID BM Eugenie Filler, MD   237 mL at 04/11/17 1354  . ferrous sulfate tablet 325 mg  325 mg Oral Q breakfast Eugenie Filler, MD   325 mg at 04/11/17 0739  . HYDROcodone-acetaminophen (NORCO/VICODIN) 5-325 MG per tablet 1-2 tablet  1-2 tablet Oral Q4H PRN Doutova, Anastassia, MD      . levothyroxine (SYNTHROID, LEVOTHROID) tablet 25 mcg  25 mcg Oral QAC breakfast Eugenie Filler, MD   25 mcg at 04/11/17 0739  . metroNIDAZOLE (FLAGYL) tablet 500 mg  500 mg Oral TID Toy Baker, MD   500 mg at 04/11/17 1007  . midodrine (PROAMATINE) tablet 2.5 mg  2.5 mg Oral TID WC Doutova, Anastassia, MD   2.5 mg at 04/11/17 1111  . multivitamin with minerals tablet 1 tablet  1 tablet Oral Daily  Eugenie Filler, MD   1 tablet at 04/11/17 1324  . ondansetron (ZOFRAN) tablet 4 mg  4 mg Oral Q6H PRN Toy Baker, MD       Or  . ondansetron (ZOFRAN) injection 4 mg  4 mg Intravenous Q6H PRN Doutova, Anastassia, MD      . polyethylene glycol (MIRALAX / GLYCOLAX) packet 17 g  17 g Oral Daily Eugenie Filler, MD      . sodium chloride flush (NS) 0.9 % injection 10-40 mL  10-40 mL Intracatheter PRN Eugenie Filler, MD      . sodium chloride flush (NS) 0.9 % injection 3 mL  3 mL Intravenous Q12H Toy Baker, MD   3 mL at 04/09/17 0044  . sodium chloride flush (NS) 0.9 % injection 3 mL  3 mL Intravenous PRN Doutova, Anastassia, MD      . tiZANidine (ZANAFLEX) tablet 2 mg  2 mg Oral TID Eugenie Filler, MD   2 mg at 04/11/17 1011  . vancomycin (VANCOCIN) IVPB 1000 mg/200 mL premix  1,000 mg Intravenous Q24H Angela Adam, [facility]   Stopped at 04/11/17 1432     Discharge Medications: Please see discharge summary for  a list of discharge medications.  Relevant Imaging Results:  Relevant Lab Results:   Additional Information SSN: [id number]  Burnis Medin, LCSW

## 2017-04-11 NOTE — Progress Notes (Signed)
Patient has discharged to SNF [facility] on 04/11/17. Discharge instruction including medication and appointment was given to patient. Patient has no question. Rn called to report to [facility] at [place] at [phone]. No question at this time . SW is notifed. PTAR set up by SW.

## 2017-04-11 NOTE — Progress Notes (Signed)
Initial Nutrition Assessment  DOCUMENTATION CODES:   Severe malnutrition in context of chronic illness, Underweight  INTERVENTION:   Ensure Enlive po TID, each supplement provides 350 kcal and 20 grams of protein  NUTRITION DIAGNOSIS:   Severe Malnutrition related to chronic illness(parapelgia, multiple sclerosis) as evidenced by severe fat depletion, severe muscle depletion.  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 Recently admitted for sepsis due to osteomyelitis, discharged to [facility] 2/19. Presents this admission with anemia with heme positive stools.    Spoke with pt at bedside. Denies any recent loss of appetite. Reports consuming three meals (with protein/grain item in each) provided at SNF with two ensures daily.  Pt ate 100% breakfast potatoes and a bagel with cream cheese. Denies any swallowing issues. Would like to continue Ensure this hospital stay. RD to order.   Patient endorses a UBW of 100 lb and denies any recent wt loss. Records indicate pt has steadily gained weight since her admission in 11/2016 (from 86 lb to 95 lb). Do not suspect any significant weight loss.   Nutrition-Focused physical exam completed. Patient is chronically bed bound and shows severe muscle/fat depletion.Suspect most of this is from immobility and MS disease progression. Will monitor weight and intake.    Medications reviewed and include: calcium-vit D, ferrous sulfate, IV abx Labs reviewed.   NUTRITION - FOCUSED PHYSICAL EXAM:    Most Recent Value  Orbital Region  No depletion  Upper Arm Region  Severe depletion  Thoracic and Lumbar Region   Severe depletion  Buccal Region  Moderate depletion  Temple Region  Moderate depletion  Clavicle Bone Region  Severe depletion  Clavicle and Acromion Bone Region  Severe depletion  Scapular Bone Region  Severe depletion  Dorsal Hand  Severe depletion  Patellar Region  Severe depletion  Anterior Thigh Region  Severe depletion  Posterior Calf Region  Severe depletion  Edema (RD Assessment)  None  Hair  Reviewed  Eyes  Reviewed  Mouth  Reviewed  Skin  Reviewed  Nails  Reviewed     Diet Order:  Diet regular Room service appropriate? Yes; Fluid consistency: Thin  EDUCATION NEEDS:   Education needs have been addressed  Skin:  Skin Integrity Issues:: Stage IV, Stage III, Stage II Stage II: left ischium Stage III: right ischium Stage IV: sacrum  Last BM:  04/11/17  Height:   Ht Readings from Last 1 Encounters:  04/09/17 5\' 9"  (1.753 m)    Weight:   Wt Readings from Last 1 Encounters:  04/09/17 95 lb 14.4 oz (43.5 kg)    Ideal Body Weight:     BMI:  Body mass index is 14.16 kg/m.  Estimated Nutritional Needs:   Kcal:  1500-1700 kcal/day  Protein:  75-85 g/day  Fluid:  >1.5 L/day    Mariana Single RD, LDN Clinical Nutrition Pager # - [phone]

## 2017-04-11 NOTE — Progress Notes (Signed)
[facility] arrived to transport pt to [place] at 2020. All personal belongings sent with pt. Report previously called to facility per dayshift RN.

## 2017-04-12 LAB — TYPE AND SCREEN
ABO/RH(D): AB POS
ANTIBODY SCREEN: NEGATIVE
UNIT DIVISION: 0
UNIT DIVISION: 0
Unit division: 0
Unit division: 0

## 2017-04-12 LAB — BPAM RBC
BLOOD PRODUCT EXPIRATION DATE: 201903282359
BLOOD PRODUCT EXPIRATION DATE: 201903282359
BLOOD PRODUCT EXPIRATION DATE: 201903282359
Blood Product Expiration Date: 201903282359
ISSUE DATE / TIME: 201903082259
ISSUE DATE / TIME: 201903090153
UNIT TYPE AND RH: 6200
UNIT TYPE AND RH: 6200
Unit Type and Rh: 6200
Unit Type and Rh: 6200

## 2017-04-12 LAB — CALCITRIOL (1,25 DI-OH VIT D): Vit D, 1,25-Dihydroxy: 16.4 pg/mL — ABNORMAL LOW (ref 19.9–79.3)

## 2017-04-13 ENCOUNTER — Telehealth: Payer: Self-pay

## 2017-04-13 NOTE — Telephone Encounter (Signed)
Possible re-admission to facility. This is a patient you were seeing at [facility]. [facility] F/U is needed if patient was re-admitted to facility upon discharge. Hospital discharge from [facility] on 04/11/2017

## 2017-04-14 ENCOUNTER — Encounter: Payer: Self-pay | Admitting: Internal Medicine

## 2017-04-14 ENCOUNTER — Non-Acute Institutional Stay (SKILLED_NURSING_FACILITY): Payer: Medicare Other | Admitting: Internal Medicine

## 2017-04-14 DIAGNOSIS — G35 Multiple sclerosis: Secondary | ICD-10-CM

## 2017-04-14 DIAGNOSIS — G8220 Paraplegia, unspecified: Secondary | ICD-10-CM

## 2017-04-14 DIAGNOSIS — R627 Adult failure to thrive: Secondary | ICD-10-CM

## 2017-04-14 DIAGNOSIS — L89154 Pressure ulcer of sacral region, stage 4: Secondary | ICD-10-CM

## 2017-04-14 DIAGNOSIS — D638 Anemia in other chronic diseases classified elsewhere: Secondary | ICD-10-CM | POA: Diagnosis not present

## 2017-04-14 DIAGNOSIS — N319 Neuromuscular dysfunction of bladder, unspecified: Secondary | ICD-10-CM | POA: Diagnosis not present

## 2017-04-14 DIAGNOSIS — E43 Unspecified severe protein-calorie malnutrition: Secondary | ICD-10-CM | POA: Diagnosis not present

## 2017-04-14 DIAGNOSIS — M8609 Acute hematogenous osteomyelitis, multiple sites: Secondary | ICD-10-CM | POA: Diagnosis not present

## 2017-04-14 NOTE — Progress Notes (Signed)
Patient ID: Deanna Baldwin, female   DOB: 03/06/1975, 41 y.o.   MRN: [id number]  Provider:  DR MONICA S CARTER Location:  [facility] Room Number: 215 A Place of Service:  SNF (31)  PCP: Carter, Monica, DO Patient Care Team: Carter, Monica, DO as PCP - General (Internal Medicine) Center, [facility] (Skilled [facility] Facility)  Extended Emergency Contact Information Primary Emergency Contact: Foddrell,Linda          [place], [place] [address] [place] Home Phone: [phone] Mobile Phone: [phone] Relation: Aunt Secondary Emergency Contact: Smith,Crystal  [place] Mobile Phone: [phone] Relation: Sister  Code Status: Full Code Goals of Care: Advanced Directive information Advanced Directives 04/14/2017  Does Patient Have a Medical Advance Directive? Yes  Type of Advance Directive Out of facility DNR (pink MOST or yellow form)  Does patient want to make changes to medical advance directive? No - Patient declined  Copy of Healthcare Power of Attorney in Chart? -  Would patient like information on creating a medical advance directive? -  Pre-existing out of facility DNR order (yellow form or pink MOST form) Pink MOST form placed in chart (order not valid for inpatient use)      Chief Complaint  Patient presents with  . Readmit To SNF    Readmission    HPI: Patient is a 41 y.o. female seen today for re-admission to SNF following hospital stay for acute anemia. Hgb 5.8 on admission. She was transfused 2 units PRBCs to Hgb 9.7. She reported vaginal bleeding to hospital provider after no cycle x 5 yrs. Previous admission revealed hemorrhoids and FOBT (+) that req'd PRBC. She is not a good candidate for colonoscopy 2/2 severe sacral decub ulcer. Albumin 2.9. She presents to SNF for long term care.  Today she reports no concerns. No f/c. Appetite is excellent. No nursing issues. She is on IV abx for acute osteomyelitis of left  pelvis/thigh  Chronic constipation - stable on miralax daily and dulcolax 5 mg tab daily   Anemia of chronic disease - stable on iron twice daily. Hgb 9.7 s/p 2 units PRBCs  Elevated alkaline phosphatase - alk phos 305; CD 19 and 20 are neg. RUQ US neg;   Tachycardia with low blood pressure - stable on midodrine 2.5 mg three times daily  Chronic pain syndrome - stable on tylenol 1 gm three times daily; voltaren gel 2 gm to left shoulder three times daily; motrin 200 mg twice daily   MS - followed by neurology. She has b/l lower extremity splints in place. 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  Albumin 2.9 and she gets nutritional supplements per facility protocol including prostat 30 cc two times daily; also takes remeron 7.5 mg nightly.   Past Medical History:  Diagnosis Date  . Buttock wound 03/03/2016  . Dysrhythmia    tachycardia  . MS (multiple sclerosis) (HCC)   . Neutropenia (HCC)   . Pneumonia 02/2016  . Protein calorie malnutrition (HCC)   . Severe sepsis (HCC) 03/03/2016  . 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)   . UTI (urinary tract infection) 02/2016     Past Surgical History:  Procedure Laterality Date  . DIVERTING ILEOSTOMY N/A 09/03/2016   Procedure: ILEAL CONDUIT  URINARY DIVERSION OPEN;  Surgeon: Ardis Hughs, MD;  Location: [facility] ORS;  Service: Urology;  Laterality: N/A;    reports that she quit smoking about 7 years ago. Her smoking use included cigarettes. She has a 10.00 pack-year smoking history. she has never used smokeless tobacco. She reports that she does not drink alcohol or use drugs. Social History    Socioeconomic History  . Marital status: Single    Spouse name: Not on file  . Number of children: Not on file  . Years of education: Not on file  . Highest education level: Not on file  Social Needs  . Financial resource strain: Not on file  . Food insecurity - worry: Not on file  . Food insecurity - inability: Not on file  . Transportation needs - medical: Not on file  . Transportation needs - non-medical: Not on file  Occupational History  . Occupation: Disabled  Tobacco Use  . Smoking status: Former Smoker    Packs/day: 1.00    Years: 10.00    Pack years: 10.00    Types: Cigarettes    Last attempt to quit: 02/01/2010    Years since quitting: 7.2  . Smokeless tobacco: Never Used  Substance and Sexual Activity  . Alcohol use: No  . Drug use: No  . Sexual activity: Not on file  Other Topics Concern  . Not on file  Social History Narrative   She is a resident at [facility].   Right-handed.    Functional Status Survey:    Family History  Problem Relation Age of Onset  . Mental illness Sister     Health Maintenance  Topic Date Due  . TETANUS/TDAP  04/15/2018 (Originally 07/16/1995)  . INFLUENZA VACCINE  Completed  . HIV Screening  Completed  . PAP SMEAR  Discontinued    No Known Allergies  Outpatient Encounter Medications as of 04/14/2017  Medication Sig  . acetaminophen (TYLENOL) 325 MG tablet Take 2 tablets (650 mg total) by mouth every 6 (six) hours as needed for mild pain (or Fever >/= 101).  . baclofen (LIORESAL) 20 MG tablet Take 20 mg by mouth 4 (four) times daily.  . bisacodyl (DULCOLAX) 5 MG EC tablet Take 5 mg by mouth daily.  . calcium-vitamin D (OSCAL WITH D) 500-200 MG-UNIT tablet Take 2 tablets by mouth 4 (four) times daily.  . cefTRIAXone (ROCEPHIN) IVPB Inject 2 g into the vein daily. Indication: sacral osteomyelitis Last Day of Therapy:  05/14/17 Labs - Once weekly:  CBC/D and BMP, Labs - Every other week:  ESR and CRP  .  Cholecalciferol 1000 units tablet Take 2,000 Units by mouth daily.  . collagenase (SANTYL) ointment Apply topically daily.  . Cyanocobalamin (VITAMIN B-12 PO) Take 1 tablet by mouth daily.  . diclofenac sodium (VOLTAREN) 1 % GEL Apply 2 g topically 3 (three) times daily. Apply to Left shoulder  . ENSURE (ENSURE) Take 237 mLs by mouth. Vanilla  . famotidine (PEPCID) 20 MG tablet Take 20 mg by mouth every morning.  . ferrous sulfate 325 (65 FE) MG tablet Take 325 mg by mouth daily with breakfast.  . HYDROcodone-acetaminophen (NORCO/VICODIN) 5-325 MG tablet Take 1-2 tablets by mouth every 4 (four) hours as needed for moderate pain.  Marland Kitchen ibuprofen (ADVIL,MOTRIN) 200 MG tablet Take 1 tablet (200 mg total) by mouth every 8 (eight) hours as needed  for fever, headache or moderate pain.  . levothyroxine (SYNTHROID, LEVOTHROID) 25 MCG tablet Take 1 tablet (25 mcg total) by mouth daily before breakfast.  . metroNIDAZOLE (FLAGYL) 500 MG tablet Take 1 tablet (500 mg total) by mouth 3 (three) times daily.  . midodrine (PROAMATINE) 2.5 MG tablet Take 1 tablet (2.5 mg total) by mouth 3 (three) times daily with meals.  . Multiple Vitamins-Minerals (DECUBI-VITE) CAPS Take 1 capsule by mouth daily.  . Nutritional Supplements (JUVEN PO) Give 1 packet with 120 ml of water one time daily x 30 days  . ondansetron (ZOFRAN) 4 MG tablet Take 4 mg by mouth every 6 (six) hours as needed for nausea or vomiting.  . polyethylene glycol (MIRALAX / GLYCOLAX) packet Take 17 g by mouth daily.  . Probiotic Product (PROBIOTIC PO) Take 1 tablet by mouth daily.  . tizanidine (ZANAFLEX) 2 MG capsule Take 2 mg by mouth 3 (three) times daily.  . UNABLE TO FIND PROTEIN LIQUID- 30 ml 2 TIMES DAILY RELATED TO UNSPECIFIED BUTTOCK  . UNABLE TO FIND MAGIC CUP WITH MEALS 3 TIMES DAILY  . vancomycin IVPB Inject 1,000 mg into the vein daily. Indication: sacral osteomyelitis Last Day of Therapy:  05/14/17 Labs - Sunday/Monday:  CBC/D, BMP, and  vancomycin trough. Labs - Thursday:  BMP and vancomycin trough Labs - Every other week:  ESR and CRP  . vitamin C (ASCORBIC ACID) 500 MG tablet Take 500 mg by mouth 2 (two) times daily.  . Vitamin D, Ergocalciferol, (DRISDOL) 50000 units CAPS capsule Take 50,000 Units by mouth every 30 (thirty) days.  . [DISCONTINUED] Amino Acids-Protein Hydrolys (FEEDING SUPPLEMENT, PRO-STAT SUGAR FREE 64,) LIQD Take 30 mLs by mouth 2 (two) times daily. (Patient not taking: Reported on 04/14/2017)  . [DISCONTINUED] nutrition supplement, JUVEN, (JUVEN) PACK Take 1 packet by mouth daily.  . [DISCONTINUED] UNABLE TO FIND HOUSE SUPPLEMENT-120 CC THREE TIMES DAILY   No facility-administered encounter medications on file as of 04/14/2017.     Review of Systems  Constitutional: Positive for fatigue.  Neurological: Positive for weakness.  All other systems reviewed and are negative.   Vitals:   04/14/17 1137  BP: 118/70  Pulse: 77  Resp: 18  Temp: 97.9 F (36.6 C)  SpO2: 97%  Weight: 92 lb 3.2 oz (41.8 kg)  Height: 5' 9" (1.753 m)   Body mass index is 13.62 kg/m. Physical Exam  Constitutional: She is oriented to person, place, and time. She appears well-developed.  Frail appearing in NAD, sitting up in bed   HENT:  Mouth/Throat: Oropharynx is clear and moist. No oropharyngeal exudate.  MMM; no oral thrush  Eyes: Pupils are equal, round, and reactive to light. No scleral icterus.  Neck: Neck supple. Carotid bruit is not present. No tracheal deviation present. No thyromegaly present.  Cardiovascular: Normal rate, regular rhythm and intact distal pulses. Exam reveals no gallop and no friction rub.  Murmur (1/6 SEM) heard. Right arm PICC intact with no redness or d/c at insertion site. No LE edema b/l. No calf TTP  Pulmonary/Chest: Effort normal and breath sounds normal. No stridor. No respiratory distress. She has no wheezes. She has no rales.  Abdominal: Soft. Normal appearance and bowel sounds are  normal. She exhibits no distension and no mass. There is no hepatomegaly. There is no tenderness. There is no rigidity, no rebound and no guarding. No hernia.  Urostomy stoma intact with no redness or purulent d/c  Genitourinary:  Genitourinary Comments: Urostomy DTG clear yellow   urine  Musculoskeletal: She exhibits edema and deformity (b/l LE extremity contractures).  Lymphadenopathy:    She has no cervical adenopathy.  Neurological: She is alert and oriented to person, place, and time.  Skin: Skin is warm and dry. No rash noted.  Pressure wounds on sacrum/ischial per wound care - followed by facility wound provider  Psychiatric: She has a normal mood and affect. Her behavior is normal. Judgment and thought content normal.    Labs reviewed: Basic Metabolic Panel: Recent Labs    09/10/16 0516  04/09/17 0759 04/09/17 0912 04/10/17 0536 04/11/17 0448  NA 142   < > 144  --  142 141  K 3.5   < > 4.3  --  3.8 3.9  CL 106   < > 110  --  107 106  CO2 27   < > 25  --  26 26  GLUCOSE 96   < > 89  --  98 101*  BUN 9   < > 20  --  19 20  CREATININE <0.30*   < > 0.45  --  0.45 0.49  CALCIUM 8.9   < > 6.4* 6.3* 7.6* 8.1*  MG 1.8  --  1.6*  --  2.3  --   PHOS 3.2  --  6.0*  --  4.6  --    < > = values in this interval not displayed.   Liver Function Tests: Recent Labs    03/22/17 0436 03/31/17 04/09/17 0759 04/10/17 0536  AST 44* 25 19 20  ALT 90* 22 14 14  ALKPHOS 1,012* 603* 298* 305*  BILITOT 0.6  --  0.5 0.5  PROT 5.7*  --  6.5 6.5  ALBUMIN 2.4*  --  3.0* 2.9*   Recent Labs    03/17/17 0918  LIPASE 128*   No results for input(s): AMMONIA in the last 8760 hours. CBC: Recent Labs    03/25/17 03/31/17 04/08/17 1624 04/09/17 0759 04/10/17 0536 04/11/17 0448  WBC 8.5 7.5 8.1 7.5 9.4 9.3  NEUTROABS 7 5 5.6  --   --   --   HGB 7.5* 7.4* 5.8* 9.6* 9.5* 9.7*  HCT 22* 22* 18.0* 28.6* 28.8* 29.9*  MCV  --   --  90.9 86.9 88.6 90.9  PLT 488* 615* 602* 469* 467* 444*    Cardiac Enzymes: Recent Labs    08/31/16 0259 08/31/16 1155 08/31/16 1449  TROPONINI <0.03 <0.03 <0.03   BNP: Invalid input(s): POCBNP Lab Results  Component Value Date   HGBA1C 5.5 06/24/2016   Lab Results  Component Value Date   TSH 14.353 (H) 04/09/2017   Lab Results  Component Value Date   VITAMINB12 690 04/08/2017   Lab Results  Component Value Date   FOLATE 30.0 04/08/2017   Lab Results  Component Value Date   IRON 51 04/08/2017   TIBC 199 (L) 04/08/2017   FERRITIN 736 (H) 04/08/2017    Imaging and Procedures obtained prior to SNF admission: No results found.  Assessment/Plan   ICD-10-CM   1. Anemia of chronic disease D63.8   2. Acute hematogenous osteomyelitis of multiple sites (HCC) M86.09   3. Pressure injury of sacral region, stage 4 (HCC) L89.154   4. Protein-calorie malnutrition, severe (HCC) E43   5. Relapsing remitting multiple sclerosis (HCC) G35   6. Neurogenic bladder N31.9   7. FTT (failure to thrive) in adult R62.7   8. Paraplegia (HCC) G82.20     Complete IV abx as ordered (STOP   DATE 05/14/17). Pull PICC line after completion  Cont other meds as ordered  Cont nutritional supplements as ordered  PT/OT/ST as ordered  Wound care as ordered  Foley cath care as indicated  F/u with ID as scheduled  GOAL: short term rehab then long term care. Communicated with pt and nursing.   Labs/tests ordered: follow labs weekly per ID     Pat Elicker S. Perlie Gold  [facility] [address] [place], [place] [address] [phone] Cell (Monday-Friday 8 AM - 5 PM) [phone] After 5 PM and follow prompts

## 2017-04-18 ENCOUNTER — Ambulatory Visit (INDEPENDENT_AMBULATORY_CARE_PROVIDER_SITE_OTHER): Payer: Medicare Other | Admitting: Infectious Disease

## 2017-04-18 ENCOUNTER — Encounter: Payer: Self-pay | Admitting: Infectious Disease

## 2017-04-18 VITALS — BP 100/69 | HR 106 | Temp 98.0°F

## 2017-04-18 DIAGNOSIS — G35 Multiple sclerosis: Secondary | ICD-10-CM

## 2017-04-18 DIAGNOSIS — G8220 Paraplegia, unspecified: Secondary | ICD-10-CM | POA: Diagnosis not present

## 2017-04-18 DIAGNOSIS — G8250 Quadriplegia, unspecified: Secondary | ICD-10-CM

## 2017-04-18 DIAGNOSIS — M86152 Other acute osteomyelitis, left femur: Secondary | ICD-10-CM | POA: Diagnosis not present

## 2017-04-18 DIAGNOSIS — L89153 Pressure ulcer of sacral region, stage 3: Secondary | ICD-10-CM

## 2017-04-18 DIAGNOSIS — G35D Multiple sclerosis, unspecified: Secondary | ICD-10-CM

## 2017-04-18 NOTE — Progress Notes (Signed)
Subjective:    Patient ID: Deanna Baldwin, female    DOB: 04-18-1975, 42 y.o.   MRN: [id number]  HPI  42 y.o. 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 We had hoped for I and D by surgery with deep cultures to guide therapy but surgery felt no indication for surgery. We instead have been treating her with empriic vancomycin, ceftriaxone and  Metronidazole orally.  She was DC to SNF and is now back home. She is being followed by wound care closely and states that wounds are getting much smaller.  She appears dramatically better vs when I saw her int he inpatient world.  Her PICC is clean.  Past Medical History:  Diagnosis Date  . Buttock wound 03/03/2016  . Dysrhythmia    tachycardia  . MS (multiple sclerosis) ([place])   . Neutropenia ([place])   . Pneumonia 02/2016  . Protein calorie malnutrition ([place])   . Severe sepsis ([place]) 03/03/2016  . Spastic 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 ([place])   . UTI (urinary tract infection) 02/2016    Past Surgical History:  Procedure Laterality Date  . DIVERTING ILEOSTOMY N/A 09/03/2016   Procedure: ILEAL CONDUIT  URINARY DIVERSION OPEN;  Surgeon: Ardis Hughs, MD;  Location: [facility];  Service: Urology;  Laterality: N/A;    Family History  Problem Relation Age of Onset  . Mental illness Sister       Social History   Socioeconomic History  . Marital status: Single    Spouse name: None  . Number of children: None  . Years of education: None  . Highest education level: None  Social Needs  . Financial resource strain: None  . Food insecurity - worry: None  . Food insecurity - inability: None  . Transportation needs - medical: None  . Transportation needs - non-medical: None  Occupational History  . Occupation: Disabled  Tobacco Use  . Smoking status: Former  Smoker    Packs/day: 1.00    Years: 10.00    Pack years: 10.00    Types: Cigarettes    Last attempt to quit: 02/01/2010    Years since quitting: 7.2  . Smokeless tobacco: Never Used  Substance and Sexual Activity  . Alcohol use: No  . Drug use: No  . Sexual activity: None  Other Topics Concern  . None  Social History Narrative   She is a resident at [employer].   Right-handed.    No Known Allergies   Current Outpatient Medications:  .  acetaminophen (TYLENOL) 325 MG tablet, Take 2 tablets (650 mg total) by mouth every 6 (six) hours as needed for mild pain (or Fever >/= 101)., Disp: , Rfl:  .  baclofen (LIORESAL) 20 MG tablet, Take 20 mg by mouth 4 (four) times daily., Disp: , Rfl:  .  bisacodyl (DULCOLAX) 5 MG EC tablet, Take 5 mg by mouth daily., Disp: , Rfl:  .  calcium-vitamin D (OSCAL WITH D) 500-200 MG-UNIT tablet, Take 2 tablets by mouth 4 (four) times daily., Disp: , Rfl:  .  cefTRIAXone (ROCEPHIN) IVPB, Inject 2 g into the vein daily. Indication: sacral osteomyelitis Last Day of Therapy:  05/14/17 Labs - Once weekly:  CBC/D and BMP, Labs - Every other week:  ESR and CRP, Disp: 55 Units, Rfl: 0 .  Cholecalciferol 1000 units tablet, Take 2,000 Units by mouth  daily., Disp: , Rfl:  .  collagenase (SANTYL) ointment, Apply topically daily., Disp: 15 g, Rfl: 0 .  Cyanocobalamin (VITAMIN B-12 PO), Take 1 tablet by mouth daily., Disp: , Rfl:  .  diclofenac sodium (VOLTAREN) 1 % GEL, Apply 2 g topically 3 (three) times daily. Apply to Left shoulder, Disp: , Rfl:  .  ENSURE (ENSURE), Take 237 mLs by mouth. Vanilla, Disp: , Rfl:  .  famotidine (PEPCID) 20 MG tablet, Take 20 mg by mouth every morning., Disp: , Rfl:  .  ferrous sulfate 325 (65 FE) MG tablet, Take 325 mg by mouth daily with breakfast., Disp: , Rfl:  .  HYDROcodone-acetaminophen (NORCO/VICODIN) 5-325 MG tablet, Take 1-2 tablets by mouth every 4 (four) hours as needed for moderate pain., Disp: 10 tablet, Rfl: 0 .   ibuprofen (ADVIL,MOTRIN) 200 MG tablet, Take 1 tablet (200 mg total) by mouth every 8 (eight) hours as needed for fever, headache or moderate pain., Disp: 30 tablet, Rfl: 0 .  levothyroxine (SYNTHROID, LEVOTHROID) 25 MCG tablet, Take 1 tablet (25 mcg total) by mouth daily before breakfast., Disp: 30 tablet, Rfl: 0 .  metroNIDAZOLE (FLAGYL) 500 MG tablet, Take 1 tablet (500 mg total) by mouth 3 (three) times daily., Disp: 159 tablet, Rfl: 0 .  midodrine (PROAMATINE) 2.5 MG tablet, Take 1 tablet (2.5 mg total) by mouth 3 (three) times daily with meals., Disp: , Rfl:  .  Multiple Vitamins-Minerals (DECUBI-VITE) CAPS, Take 1 capsule by mouth daily., Disp: , Rfl:  .  Nutritional Supplements (JUVEN PO), Give 1 packet with 120 ml of water one time daily x 30 days, Disp: , Rfl:  .  ondansetron (ZOFRAN) 4 MG tablet, Take 4 mg by mouth every 6 (six) hours as needed for nausea or vomiting., Disp: , Rfl:  .  polyethylene glycol (MIRALAX / GLYCOLAX) packet, Take 17 g by mouth daily., Disp: , Rfl:  .  Probiotic Product (PROBIOTIC PO), Take 1 tablet by mouth daily., Disp: , Rfl:  .  tizanidine (ZANAFLEX) 2 MG capsule, Take 2 mg by mouth 3 (three) times daily., Disp: , Rfl:  .  UNABLE TO FIND, PROTEIN LIQUID- 30 ml 2 TIMES DAILY RELATED TO UNSPECIFIED BUTTOCK, Disp: , Rfl:  .  UNABLE TO FIND, MAGIC CUP WITH MEALS 3 TIMES DAILY, Disp: , Rfl:  .  vancomycin IVPB, Inject 1,000 mg into the vein daily. Indication: sacral osteomyelitis Last Day of Therapy:  05/14/17 Labs - Sunday/Monday:  CBC/D, BMP, and vancomycin trough. Labs - Thursday:  BMP and vancomycin trough Labs - Every other week:  ESR and CRP, Disp: 55 Units, Rfl: 0 .  vitamin C (ASCORBIC ACID) 500 MG tablet, Take 500 mg by mouth 2 (two) times daily., Disp: , Rfl:  .  Vitamin D, Ergocalciferol, (DRISDOL) 50000 units CAPS capsule, Take 50,000 Units by mouth every 30 (thirty) days., Disp: , Rfl:      Review of Systems  Constitutional: Negative for chills and  fever.  HENT: Negative for congestion and sore throat.   Eyes: Negative for photophobia.  Respiratory: Negative for cough, shortness of breath and wheezing.   Cardiovascular: Negative for chest pain, palpitations and leg swelling.  Gastrointestinal: Negative for abdominal pain, blood in stool, constipation, diarrhea, nausea and vomiting.  Genitourinary: Negative for dysuria, flank pain and hematuria.  Musculoskeletal: Negative for back pain and myalgias.  Skin: Negative for rash.  Neurological: Positive for tremors and weakness. Negative for dizziness and headaches.  Hematological: Does not bruise/bleed easily.  Psychiatric/Behavioral: Negative for decreased concentration, dysphoric mood, hallucinations and suicidal ideas. The patient is not nervous/anxious and is not hyperactive.        Objective:   Physical Exam  Constitutional: She is oriented to person, place, and time. She appears well-nourished. No distress.  HENT:  Head: Normocephalic and atraumatic.  Mouth/Throat: Oropharynx is clear and moist. No oropharyngeal exudate.  Eyes: Conjunctivae and EOM are normal. No scleral icterus.  Neck: Normal range of motion. Neck supple.  Cardiovascular: Normal rate, regular rhythm and normal heart sounds. Exam reveals no gallop and no friction rub.  No murmur heard. Pulmonary/Chest: Effort normal. No respiratory distress. She has no wheezes.  Abdominal: Soft. Bowel sounds are normal. She exhibits no distension.  Musculoskeletal: She exhibits no edema or tenderness.  Neurological: She is alert and oriented to person, place, and time. Coordination abnormal.  Skin: Skin is warm and dry. No rash noted. She is not diaphoretic. No erythema.  Psychiatric: She has a normal mood and affect. Her behavior is normal. Judgment and thought content normal.   PICC is clean 04/18/17:    I did not examine her decubitus ulcers since they are to be examined tomorrow by [facility] but I will when I have her followup  prior to stopping her IV abx       Assessment & Plan:  Sacral and ischial osteomyelitis:  Complete 8 week course of IV abx Have her see my prior to stop date Wound Care to continue to follow closely  MS: seems much better controlled at present

## 2017-04-23 ENCOUNTER — Other Ambulatory Visit: Payer: Self-pay

## 2017-04-23 ENCOUNTER — Encounter (HOSPITAL_COMMUNITY): Payer: Self-pay | Admitting: *Deleted

## 2017-04-23 ENCOUNTER — Emergency Department (HOSPITAL_COMMUNITY)
Admission: EM | Admit: 2017-04-23 | Discharge: 2017-04-23 | Disposition: A | Discharge status: Home / Self Care | Payer: Medicare Other | Attending: Emergency Medicine | Admitting: Emergency Medicine

## 2017-04-23 DIAGNOSIS — Z87891 Personal history of nicotine dependence: Secondary | ICD-10-CM | POA: Diagnosis not present

## 2017-04-23 DIAGNOSIS — I10 Essential (primary) hypertension: Secondary | ICD-10-CM | POA: Insufficient documentation

## 2017-04-23 DIAGNOSIS — G35 Multiple sclerosis: Secondary | ICD-10-CM | POA: Diagnosis not present

## 2017-04-23 DIAGNOSIS — E039 Hypothyroidism, unspecified: Secondary | ICD-10-CM | POA: Diagnosis not present

## 2017-04-23 DIAGNOSIS — R531 Weakness: Secondary | ICD-10-CM | POA: Diagnosis not present

## 2017-04-23 DIAGNOSIS — R799 Abnormal finding of blood chemistry, unspecified: Secondary | ICD-10-CM | POA: Diagnosis present

## 2017-04-23 DIAGNOSIS — Z79899 Other long term (current) drug therapy: Secondary | ICD-10-CM | POA: Insufficient documentation

## 2017-04-23 LAB — CBC WITH DIFFERENTIAL/PLATELET
BASOS PCT: 1 %
Basophils Absolute: 0 10*3/uL (ref 0.0–0.1)
EOS ABS: 0.4 10*3/uL (ref 0.0–0.7)
Eosinophils Relative: 7 %
HCT: 29.5 % — ABNORMAL LOW (ref 36.0–46.0)
HEMOGLOBIN: 9.4 g/dL — AB (ref 12.0–15.0)
Lymphocytes Relative: 14 %
Lymphs Abs: 0.9 10*3/uL (ref 0.7–4.0)
MCH: 29.6 pg (ref 26.0–34.0)
MCHC: 31.9 g/dL (ref 30.0–36.0)
MCV: 92.8 fL (ref 78.0–100.0)
MONOS PCT: 7 %
Monocytes Absolute: 0.5 10*3/uL (ref 0.1–1.0)
NEUTROS PCT: 71 %
Neutro Abs: 4.5 10*3/uL (ref 1.7–7.7)
Platelets: 369 10*3/uL (ref 150–400)
RBC: 3.18 MIL/uL — ABNORMAL LOW (ref 3.87–5.11)
RDW: 16.6 % — ABNORMAL HIGH (ref 11.5–15.5)
WBC: 6.3 10*3/uL (ref 4.0–10.5)

## 2017-04-23 LAB — BASIC METABOLIC PANEL
Anion gap: 12 (ref 5–15)
BUN: 28 mg/dL — ABNORMAL HIGH (ref 6–20)
CALCIUM: 6.7 mg/dL — AB (ref 8.9–10.3)
CO2: 26 mmol/L (ref 22–32)
CREATININE: 0.46 mg/dL (ref 0.44–1.00)
Chloride: 105 mmol/L (ref 101–111)
Glucose, Bld: 88 mg/dL (ref 65–99)
Potassium: 4.6 mmol/L (ref 3.5–5.1)
SODIUM: 143 mmol/L (ref 135–145)

## 2017-04-23 LAB — HEPATIC FUNCTION PANEL
ALT: 26 U/L (ref 14–54)
AST: 36 U/L (ref 15–41)
Albumin: 3.4 g/dL — ABNORMAL LOW (ref 3.5–5.0)
Alkaline Phosphatase: 284 U/L — ABNORMAL HIGH (ref 38–126)
Bilirubin, Direct: <0.1 mg/dL — ABNORMAL LOW (ref 0.1–0.5)
TOTAL PROTEIN: 7.1 g/dL (ref 6.5–8.1)
Total Bilirubin: 0.5 mg/dL (ref 0.3–1.2)

## 2017-04-23 LAB — MAGNESIUM: MAGNESIUM: 1.5 mg/dL — AB (ref 1.7–2.4)

## 2017-04-23 LAB — TSH: TSH: 7.806 u[IU]/mL — AB (ref 0.350–4.500)

## 2017-04-23 MED ORDER — CALCIUM CARBONATE ANTACID 500 MG PO CHEW
400.0000 mg | CHEWABLE_TABLET | Freq: Once | ORAL | Status: AC
  Administered 2017-04-23: 400 mg via ORAL
  Filled 2017-04-23: qty 2

## 2017-04-23 MED ORDER — MAGNESIUM SULFATE 2 GM/50ML IV SOLN
2.0000 g | Freq: Once | INTRAVENOUS | Status: AC
  Administered 2017-04-23: 2 g via INTRAVENOUS
  Filled 2017-04-23: qty 50

## 2017-04-23 MED ORDER — MAGNESIUM OXIDE 400 (241.3 MG) MG PO TABS: 400.0000 mg | ORAL_TABLET | Freq: Two times a day (BID) | ORAL | 0 refills | Status: DC

## 2017-04-23 MED ORDER — SODIUM CHLORIDE 0.9 % IV SOLN
1.0000 g | Freq: Once | INTRAVENOUS | Status: AC
  Administered 2017-04-23: 1 g via INTRAVENOUS
  Filled 2017-04-23: qty 10

## 2017-04-23 MED ORDER — SODIUM CHLORIDE 0.9 % IV SOLN
INTRAVENOUS | Status: DC
  Administered 2017-04-23: 13:00:00 via INTRAVENOUS

## 2017-04-23 NOTE — Discharge Instructions (Signed)
Your magnesium is low, which is likely contributing to the calcium staying low.  Continue taking the calcium already prescribed along with the magnesium prescribed today.  Make sure you are eating and drinking well every day.  Have your doctor recheck your calcium and magnesium levels, in 1-2 weeks.

## 2017-04-23 NOTE — ED Notes (Signed)
[facility] called for transport.  

## 2017-04-23 NOTE — ED Notes (Signed)
Bed: WA10 Expected date: 04/23/17 Expected time: 11:14 AM Means of arrival: Ambulance Comments: 42 yo [place] 5.9 bedridden from facility

## 2017-04-23 NOTE — ED Triage Notes (Signed)
Pt bib [facility] from [place] and presents with an abnormal Ca of 5.9 from repeat labs.  Pt denied any symptoms to [facility].  Pt has no denies pain and only reported being "tired of coming to the hospital all the time."   Pt currently on Vancomycin and Rocephin for osteomyelitis.    Pt a/o x 4 and is bed bound d/t to MS.

## 2017-04-23 NOTE — ED Provider Notes (Signed)
[facility] DEPT Provider Note   CSN: [id number] Arrival date & time: 04/23/17  1110     History   Chief Complaint Chief Complaint  Patient presents with  . Abnormal Lab    Ca of 5.9    HPI Deanna Baldwin is a 42 y.o. female.  She is here for evaluation of hypercalcemia found on routine lab recheck following recent hospitalization when she was treated for similar problem.  She is asymptomatic.  She is in a nursing care facility for chronic weakness associated with her multiple sclerosis.  She believes that she is taking calcium supplementation, but is unsure.  She denies fever, chills, nausea, vomiting, change in her chronic weakness.  She is not having any abdominal pain, anorexia, or headache.  There are no other known modifying factors.  HPI  Past Medical History:  Diagnosis Date  . Buttock wound 03/03/2016  . Dysrhythmia    tachycardia  . MS (multiple sclerosis) ([place])   . Neutropenia ([place])   . Pneumonia 02/2016  . Protein calorie malnutrition ([place])   . Severe sepsis ([place]) 03/03/2016  . Spastic 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 ([place])   . UTI (urinary tract infection) 02/2016    Patient Active Problem List   Diagnosis Date Noted  . Hypothyroidism 04/09/2017  . Hypocalcemia 04/08/2017  . Anemia 04/08/2017  . Osteomyelitis ([place]) 03/19/2017  . Altered mental status   . Dehydration   . Acute osteomyelitis of left pelvic region and thigh ([place])   . SIRS (systemic inflammatory response syndrome) ([place]) 03/17/2017  . Paraplegia ([place]) 03/09/2017  . Chronic constipation 02/13/2017  . Elevated liver enzymes 02/13/2017  . Chronic pain 12/13/2016  . Leukocytosis 09/15/2016  . Hypotension 08/31/2016  . Vitamin B 12 deficiency 07/09/2016  . Pressure ulcer, stage 4 ([place]) 07/08/2016  . Sepsis ([place]) 07/07/2016  . Unintentional weight loss 07/06/2016  . Relapsing remitting multiple sclerosis ([place]) 06/08/2016  . Neurogenic bladder  04/09/2016  . Tachycardia 04/08/2016  . Anemia of chronic disease 04/06/2016  . Dysphagia, oral phase 03/17/2016  . Palliative care by specialist   . Protein-calorie malnutrition, severe ([place])   . Spastic 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 ([place]) 03/02/2016  . UTI (urinary tract infection) 03/02/2016  . Elevated liver function tests 03/02/2016    Past Surgical History:  Procedure Laterality Date  . DIVERTING ILEOSTOMY N/A 09/03/2016   Procedure: ILEAL CONDUIT  URINARY DIVERSION OPEN;  Surgeon: Ardis Hughs, MD;  Location: [facility];  Service: Urology;  Laterality: N/A;     OB History   None      Home Medications    Prior to Admission medications   Medication Sig Start Date End Date Taking? Authorizing Provider  acetaminophen (TYLENOL) 325 MG tablet Take 2 tablets (650 mg total) by mouth every 6 (six) hours as needed for mild pain (or Fever >/= 101). 04/11/17   Eugenie Filler, MD  baclofen (LIORESAL) 20 MG tablet Take 20 mg by mouth 4 (four) times daily.    [provider]  bisacodyl (DULCOLAX) 5 MG EC tablet Take 5 mg by mouth daily.    [provider]  calcium-vitamin D (OSCAL WITH D) 500-200 MG-UNIT tablet Take 2 tablets by mouth 4 (four) times daily. 04/11/17   Eugenie Filler, MD  cefTRIAXone (ROCEPHIN) IVPB Inject 2 g into the vein daily. Indication: sacral osteomyelitis Last Day of Therapy:  05/14/17 Labs - Once weekly:  CBC/D and BMP, Labs - Every other week:  ESR  and CRP 03/22/17 05/15/17  Dessa Phi, DO  Cholecalciferol 1000 units tablet Take 2,000 Units by mouth daily.    [provider]  collagenase (SANTYL) ointment Apply topically daily. 04/11/17   Eugenie Filler, MD  Cyanocobalamin (VITAMIN B-12 PO) Take 1 tablet by mouth daily.    [provider]  diclofenac sodium (VOLTAREN) 1 % GEL Apply 2 g topically 3 (three) times daily. Apply to Left shoulder    [provider]  ENSURE (ENSURE) Take 237 mLs by  mouth. Vanilla    [provider]  famotidine (PEPCID) 20 MG tablet Take 20 mg by mouth every morning.    [provider]  ferrous sulfate 325 (65 FE) MG tablet Take 325 mg by mouth daily with breakfast.    [provider]  HYDROcodone-acetaminophen (NORCO/VICODIN) 5-325 MG tablet Take 1-2 tablets by mouth every 4 (four) hours as needed for moderate pain. 04/11/17   Eugenie Filler, MD  ibuprofen (ADVIL,MOTRIN) 200 MG tablet Take 1 tablet (200 mg total) by mouth every 8 (eight) hours as needed for fever, headache or moderate pain. 04/11/17   Eugenie Filler, MD  levothyroxine (SYNTHROID, LEVOTHROID) 25 MCG tablet Take 1 tablet (25 mcg total) by mouth daily before breakfast. 04/12/17   Eugenie Filler, MD  magnesium oxide (MAG-OX) 400 (241.3 Mg) MG tablet Take 1 tablet (400 mg total) by mouth 2 (two) times daily. 04/23/17   Daleen Bo, MD  metroNIDAZOLE (FLAGYL) 500 MG tablet Take 1 tablet (500 mg total) by mouth 3 (three) times daily. 03/22/17 05/14/17  Dessa Phi, DO  midodrine (PROAMATINE) 2.5 MG tablet Take 1 tablet (2.5 mg total) by mouth 3 (three) times daily with meals. 09/10/16   Elgergawy, Silver Huguenin, MD  Multiple Vitamins-Minerals (DECUBI-VITE) CAPS Take 1 capsule by mouth daily.    [provider]  Nutritional Supplements (JUVEN PO) Give 1 packet with 120 ml of water one time daily x 30 days 04/12/17 05/12/17  [provider]  ondansetron (ZOFRAN) 4 MG tablet Take 4 mg by mouth every 6 (six) hours as needed for nausea or vomiting.    [provider]  polyethylene glycol (MIRALAX / GLYCOLAX) packet Take 17 g by mouth daily.    [provider]  Probiotic Product (PROBIOTIC PO) Take 1 tablet by mouth daily.    [provider]  tizanidine (ZANAFLEX) 2 MG capsule Take 2 mg by mouth 3 (three) times daily.    [provider]  UNABLE TO FIND PROTEIN LIQUID- 30 ml 2 TIMES DAILY RELATED TO UNSPECIFIED BUTTOCK     [provider]  UNABLE TO FIND MAGIC CUP WITH MEALS 3 TIMES DAILY    [provider]  vancomycin IVPB Inject 1,000 mg into the vein daily. Indication: sacral osteomyelitis Last Day of Therapy:  05/14/17 Labs - Sunday/Monday:  CBC/D, BMP, and vancomycin trough. Labs - Thursday:  BMP and vancomycin trough Labs - Every other week:  ESR and CRP 03/22/17 05/15/17  Dessa Phi, DO  vitamin C (ASCORBIC ACID) 500 MG tablet Take 500 mg by mouth 2 (two) times daily.    [provider]  Vitamin D, Ergocalciferol, (DRISDOL) 50000 units CAPS capsule Take 50,000 Units by mouth every 30 (thirty) days.    [provider]    Family History Family History  Problem Relation Age of Onset  . Mental illness Sister     Social History Social History   Tobacco Use  . Smoking status: Former Smoker  Packs/day: 1.00    Years: 10.00    Pack years: 10.00    Types: Cigarettes    Last attempt to quit: 02/01/2010    Years since quitting: 7.2  . Smokeless tobacco: Never Used  Substance Use Topics  . Alcohol use: No  . Drug use: No     Allergies   Patient has no known allergies.   Review of Systems Review of Systems  All other systems reviewed and are negative.    Physical Exam Updated Vital Signs BP 103/80 (BP Location: Left Arm)   Pulse 92   Temp 98.2 F (36.8 C) (Oral)   Resp 16   LMP 01/28/2016   SpO2 99%   Physical Exam  Constitutional: She is oriented to person, place, and time. She appears well-developed. No distress.  Frail, appears under nourished  HENT:  Head: Normocephalic and atraumatic.  Eyes: Pupils are equal, round, and reactive to light. Conjunctivae and EOM are normal.  Neck: Normal range of motion and phonation normal. Neck supple.  Cardiovascular: Normal rate and regular rhythm.  Pulmonary/Chest: Effort normal and breath sounds normal. She exhibits no tenderness.  Abdominal: Soft. She exhibits no distension. There is no tenderness.  There is no guarding.  Musculoskeletal:  Atrophic extremities without significant deformities  Neurological: She is alert and oriented to person, place, and time. She exhibits normal muscle tone.  Skin: Skin is warm and dry.  Psychiatric: She has a normal mood and affect. Her behavior is normal.  Nursing note and vitals reviewed.    ED Treatments / Results  Labs (all labs ordered are listed, but only abnormal results are displayed) Labs Reviewed  BASIC METABOLIC PANEL - Abnormal; Notable for the following components:      Result Value   BUN 28 (*)    Calcium 6.7 (*)    All other components within normal limits  CBC WITH DIFFERENTIAL/PLATELET - Abnormal; Notable for the following components:   RBC 3.18 (*)    Hemoglobin 9.4 (*)    HCT 29.5 (*)    RDW 16.6 (*)    All other components within normal limits  MAGNESIUM - Abnormal; Notable for the following components:   Magnesium 1.5 (*)    All other components within normal limits  HEPATIC FUNCTION PANEL - Abnormal; Notable for the following components:   Albumin 3.4 (*)    Alkaline Phosphatase 284 (*)    Bilirubin, Direct <0.1 (*)    All other components within normal limits  TSH - Abnormal; Notable for the following components:   TSH 7.806 (*)    All other components within normal limits    EKG None  Radiology No results found.  Procedures Procedures (including critical care time)  Medications Ordered in ED Medications  0.9 %  sodium chloride infusion ( Intravenous Stopped 04/23/17 1626)  calcium carbonate (TUMS - dosed in mg elemental calcium) chewable tablet 400 mg of elemental calcium (400 mg of elemental calcium Oral Given 04/23/17 1424)  calcium gluconate 1 g in sodium chloride 0.9 % 100 mL IVPB (0 g Intravenous Stopped 04/23/17 1527)  magnesium sulfate IVPB 2 g 50 mL (0 g Intravenous Stopped 04/23/17 1528)     Initial Impression / Assessment and Plan / ED Course  I have reviewed the triage vital signs and the  nursing notes.  Pertinent labs & imaging results that were available during my care of the patient were reviewed by me and considered in my medical decision making (see chart for details).  Clinical Course as of Apr 24 1803  Sat Apr 23, 2017  1343 Improved by 50% from 2 weeks ago  TSH(!) [EW]  1343 Persistent anemia unchanged from recent baseline  CBC with Differential(!) [EW]  1344 Normal except albumin low 3.4 and alkaline phosphatase high at 284  Hepatic function panel(!) [EW]  1344 Low  Magnesium(!) [EW]  1344 Normal except elevated BUN and low calcium  Basic metabolic panel(!) [EW]  9024 Intravenous calcium and magnesium and oral calcium ordered.  Patient is currently on relatively high-dose oral calcium but not currently taking oral magnesium.   [EW]    Clinical Course User Index [EW] Daleen Bo, MD     Patient Vitals for the past 24 hrs:  BP Temp Temp src Pulse Resp SpO2  04/23/17 1637 103/80 - - 92 16 99 %  04/23/17 1615 - - - 93 - 100 %  04/23/17 1600 - - - 96 - 100 %  04/23/17 1425 101/75 - - 99 18 99 %  04/23/17 1315 - - - 92 - 99 %  04/23/17 1131 93/68 98.2 F (36.8 C) Oral 83 16 100 %   MDM-persistent hypocalcemia, with hypomagnesemia.  No clear ongoing loss therefore I suspect nutritional deficit.  Patient is asymptomatic for these 2 parameters.  She is being treated in a nursing care facility.  Doubt acute metabolic instability, acute infectious processes or impending vascular collapse.  Nursing Notes Reviewed/ Care Coordinated Applicable Imaging Reviewed Interpretation of Laboratory Data incorporated into ED treatment  The patient appears reasonably screened and/or stabilized for discharge and I doubt any other medical condition or other [facility] requiring further screening, evaluation, or treatment in the ED at this time prior to discharge.  Plan: Home Medications-continue current medications; Home Treatments-improve oral nutrition; return here if the  recommended treatment, does not improve the symptoms; Recommended follow up-PCP checkup 1 week and as needed     Final Clinical Impressions(s) / ED Diagnoses   Final diagnoses:  Hypocalcemia  Hypomagnesemia    ED Discharge Orders        Ordered    magnesium oxide (MAG-OX) 400 (241.3 Mg) MG tablet  2 times daily     04/23/17 1616       Daleen Bo, MD 04/23/17 1807

## 2017-04-25 LAB — POCT ERYTHROCYTE SEDIMENTATION RATE, NON-AUTOMATED: Sed Rate: 87

## 2017-04-25 LAB — CBC AND DIFFERENTIAL
HCT: 27 — AB (ref 36–46)
Hemoglobin: 8.9 — AB (ref 12.0–16.0)
NEUTROS ABS: 6
PLATELETS: 379 (ref 150–399)
WBC: 7.9

## 2017-04-27 ENCOUNTER — Telehealth: Payer: Self-pay | Admitting: Neurology

## 2017-04-27 ENCOUNTER — Encounter: Payer: Self-pay | Admitting: *Deleted

## 2017-04-27 NOTE — Telephone Encounter (Signed)
Derrill Kay at [facility] is calling to schedule ocrevus infusion. Please see tele note 12/14/16. Is the patient ready for infusion? Please call to advise

## 2017-04-27 NOTE — Telephone Encounter (Signed)
[facility] has scheduled her appt for 07/04/17.  A letter was faxed and confirmed to [place] at [facility] with the appt information (Atten: Elvera Bicker).  I will also provide the information below to [facility] to call.  Wandra Feinstein at [facility]: Ph: [phone] Fax: [phone].

## 2017-04-28 LAB — BASIC METABOLIC PANEL
BUN: 18 (ref 4–21)
CREATININE: 0.4 — AB (ref 0.5–1.1)
Glucose: 197
POTASSIUM: 4.3 (ref 3.4–5.3)
Sodium: 142 (ref 137–147)

## 2017-04-28 LAB — CBC AND DIFFERENTIAL
HCT: 26 — AB (ref 36–46)
Hemoglobin: 8.6 — AB (ref 12.0–16.0)
Neutrophils Absolute: 5
PLATELETS: 426 — AB (ref 150–399)
WBC: 7.5

## 2017-04-29 LAB — POCT ERYTHROCYTE SEDIMENTATION RATE, NON-AUTOMATED: Sed Rate: 102

## 2017-05-02 LAB — HEPATIC FUNCTION PANEL
ALT: 30 (ref 7–35)
AST: 45 — AB (ref 13–35)
Alkaline Phosphatase: 311 — AB (ref 25–125)
Bilirubin, Total: 0.2

## 2017-05-02 LAB — BASIC METABOLIC PANEL
BUN: 19 (ref 4–21)
Creatinine: 0.3 — AB (ref 0.5–1.1)
Glucose: 93
POTASSIUM: 5.3 (ref 3.4–5.3)
Sodium: 141 (ref 137–147)

## 2017-05-02 LAB — CBC AND DIFFERENTIAL
HEMATOCRIT: 26 — AB (ref 36–46)
Hemoglobin: 8.4 — AB (ref 12.0–16.0)
Neutrophils Absolute: 6
PLATELETS: 447 — AB (ref 150–399)
WBC: 7.5

## 2017-05-05 LAB — CBC AND DIFFERENTIAL
HEMATOCRIT: 27 — AB (ref 36–46)
HEMOGLOBIN: 9.3 — AB (ref 12.0–16.0)
NEUTROS ABS: 5
Platelets: 512 — AB (ref 150–399)
WBC: 6.7

## 2017-05-05 LAB — BASIC METABOLIC PANEL
BUN: 19 (ref 4–21)
CREATININE: 0.4 — AB (ref 0.5–1.1)
Glucose: 114
POTASSIUM: 6 — AB (ref 3.4–5.3)
SODIUM: 143 (ref 137–147)

## 2017-05-05 LAB — POCT ERYTHROCYTE SEDIMENTATION RATE, NON-AUTOMATED: SED RATE: 118

## 2017-05-09 ENCOUNTER — Encounter: Payer: Self-pay | Admitting: Internal Medicine

## 2017-05-09 ENCOUNTER — Non-Acute Institutional Stay (SKILLED_NURSING_FACILITY): Payer: Medicare Other | Admitting: Internal Medicine

## 2017-05-09 DIAGNOSIS — R627 Adult failure to thrive: Secondary | ICD-10-CM

## 2017-05-09 DIAGNOSIS — D638 Anemia in other chronic diseases classified elsewhere: Secondary | ICD-10-CM

## 2017-05-09 DIAGNOSIS — M8609 Acute hematogenous osteomyelitis, multiple sites: Secondary | ICD-10-CM

## 2017-05-09 DIAGNOSIS — G35 Multiple sclerosis: Secondary | ICD-10-CM

## 2017-05-09 DIAGNOSIS — L89154 Pressure ulcer of sacral region, stage 4: Secondary | ICD-10-CM

## 2017-05-09 DIAGNOSIS — E43 Unspecified severe protein-calorie malnutrition: Secondary | ICD-10-CM | POA: Diagnosis not present

## 2017-05-09 DIAGNOSIS — G8220 Paraplegia, unspecified: Secondary | ICD-10-CM | POA: Diagnosis not present

## 2017-05-09 DIAGNOSIS — R634 Abnormal weight loss: Secondary | ICD-10-CM | POA: Diagnosis not present

## 2017-05-09 LAB — CBC AND DIFFERENTIAL
HEMATOCRIT: 26 — AB (ref 36–46)
HEMOGLOBIN: 8.2 — AB (ref 12.0–16.0)
NEUTROS ABS: 4
Platelets: 398 (ref 150–399)
WBC: 6.4

## 2017-05-09 LAB — BASIC METABOLIC PANEL
BUN: 25 — AB (ref 4–21)
CREATININE: 0.5 (ref 0.5–1.1)
Glucose: 93
POTASSIUM: 4.9 (ref 3.4–5.3)
SODIUM: 147 (ref 137–147)

## 2017-05-09 LAB — HEPATIC FUNCTION PANEL
ALK PHOS: 331 — AB (ref 25–125)
ALT: 49 — AB (ref 7–35)
AST: 48 — AB (ref 13–35)
Bilirubin, Total: 0.2

## 2017-05-09 LAB — TSH: TSH: 13.5 — AB (ref 0.41–5.90)

## 2017-05-09 LAB — POCT ERYTHROCYTE SEDIMENTATION RATE, NON-AUTOMATED: SED RATE: 96

## 2017-05-09 NOTE — Progress Notes (Signed)
Patient ID: Deanna Baldwin, female   DOB: Sep 15, 1975, 42 y.o.   MRN: [id number]  Location:  [facility] Room Number: 215 A Place of Service:  SNF (31) Provider:  [place], [place], DO  Patient Care Team: [name], DO as PCP - General (Internal Medicine) [facility], [place] ([place])  Extended Emergency Contact Information Primary Emergency Contact: Dalbert Mayotte, [place] [address] [address] of [place] Phone: [phone] Mobile Phone: [phone] Relation: Aunt Secondary Emergency Contact: Smith,Crystal  [place] Mobile Phone: [phone] Relation: Sister  Code Status:  Full Code Goals of care: Advanced Directive information Advanced Directives 05/09/2017  Does Patient Have a Medical Advance Directive? Yes  Type of Advance Directive Out of facility DNR (pink MOST or yellow form)  Does patient want to make changes to medical advance directive? No - Patient declined  Copy of [place] in Chart? -  Would patient like information on creating a medical advance directive? -  Pre-existing out of facility DNR order (yellow form or pink MOST form) Pink MOST form placed in chart (order not valid for inpatient use)     Chief Complaint  Patient presents with  . Medical Management of Chronic Issues    [facility]    HPI:  Pt is a 42 y.o. female seen today for medical management of chronic diseases.  She has no concerns today. No pain. No f/c. She is still receiving IV abx for osteomyelitis via right arm PICC line. STOP DATE FOR VANCO 05/14/17; ROCEPHIN 05/15/17. She sleeps well. No falls.  Chronic constipation - stable on miralax daily and dulcolax 5 mg tab daily   Dysphagia - resolved  Anemia of chronic disease - 2/2  iron deficiency; last PRBC transfusion  04/2016; Hgb 8.2; takes iron twice daily  Elevated liver enzymes (ast; alt; alk phos) -  CD 19 and 20 are neg; GGT  and abdominal US neg   Tachycardia with low blood pressure - stable on midodrine 2.5 mg three times daily  Chronic pain syndrome - stable on tylenol 1 gm three times daily; voltaren gel 2 gm to left shoulder three times daily; motrin 200 mg twice daily   MS - asymptomatic;  followed by neurology; she has bilateral lower extremity splints in place. She is allergic to Tysabri;   Spastic paraplegia - stable on baclofen 20 mg four times daily ; uses bilateral splints  Sacral decubitus stage IV - followed by facility wound care provider  Neurogenic bladder - she has bladder neck erosion and has urostomy with foley cath DTG  Protein calorie malnutrition - severe; weight stable. Albumin   albumin 3.4. She gets nutritional supplements per facility protocol; off remeron     Past Medical History:  Diagnosis Date  . Buttock wound 03/03/2016  . Dysrhythmia    tachycardia  . MS (multiple sclerosis) ([place])   . Neutropenia ([place])   . Pneumonia 02/2016  . Protein calorie malnutrition ([place])   . Severe sepsis ([place]) 03/03/2016  . 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 UTI (urinary tract infection) 02/2016   Past Surgical History:  Procedure Laterality Date  . DIVERTING ILEOSTOMY N/A 09/03/2016   Procedure: ILEAL CONDUIT  URINARY DIVERSION OPEN;  Surgeon: Ardis Hughs, MD;  Location: [facility];  Service: Urology;  Laterality: N/A;    No Known Allergies  Outpatient Encounter Medications as of 05/09/2017  Medication Sig  . acetaminophen (TYLENOL) 325 MG tablet Take 2 tablets (650 mg total) by mouth every 6 (six) hours as needed for mild pain (or Fever >/= 101).  . bisacodyl (DULCOLAX) 5 MG EC tablet Take 5 mg by mouth daily.  . calcium-vitamin D (OSCAL WITH D) 500-200 MG-UNIT tablet Take 2 tablets by mouth 4 (four) times daily.  . cefTRIAXone (ROCEPHIN) IVPB Inject 2 g into the vein daily. Indication: sacral osteomyelitis Last Day of Therapy:  05/14/17 Labs - Once weekly:   CBC/D and BMP, Labs - Every other week:  ESR and CRP  . collagenase (SANTYL) ointment Apply topically daily.  . Cyanocobalamin (VITAMIN B-12 PO) Take 1 tablet by mouth daily.  . diclofenac sodium (VOLTAREN) 1 % GEL Apply 2 g topically 3 (three) times daily. Apply to Left shoulder  . ENSURE (ENSURE) Take 237 mLs by mouth. Vanilla  . famotidine (PEPCID) 20 MG tablet Take 20 mg by mouth every morning.  . ferrous sulfate 325 (65 FE) MG tablet Take 325 mg by mouth daily with breakfast.  . guaiFENesin (MUCINEX) 600 MG 12 hr tablet Take 600 mg by mouth 2 (two) times daily.  Marland Kitchen HYDROcodone-acetaminophen (NORCO/VICODIN) 5-325 MG tablet Take 1 tablet by mouth every 4 (four) hours as needed for moderate pain.  Marland Kitchen ibuprofen (ADVIL,MOTRIN) 200 MG tablet Take 200 mg by mouth every 12 (twelve) hours as needed.  Marland Kitchen levothyroxine (SYNTHROID, LEVOTHROID) 25 MCG tablet Take 1 tablet (25 mcg total) by mouth daily before breakfast.  . magnesium oxide (MAG-OX) 400 (241.3 Mg) MG tablet Take 1 tablet (400 mg total) by mouth 2 (two) times daily.  . metroNIDAZOLE (FLAGYL) 500 MG tablet Take 1 tablet (500 mg total) by mouth 3 (three) times daily.  . midodrine (PROAMATINE) 2.5 MG tablet Take 1 tablet (2.5 mg total) by mouth 3 (three) times daily with meals.  . Multiple Vitamins-Minerals (DECUBI-VITE) CAPS Take 1 capsule by mouth daily.  . Nutritional Supplements (JUVEN PO) Give 1 packet with 120 ml of water one time daily x 30 days  . ondansetron (ZOFRAN) 4 MG tablet Take 4 mg by mouth every 6 (six) hours as needed for nausea or vomiting.  . polyethylene glycol (MIRALAX / GLYCOLAX) packet Take 17 g by mouth daily.  . Probiotic Product (PROBIOTIC PO) Take 1 tablet by mouth daily.  . tizanidine (ZANAFLEX) 2 MG capsule Take 2 mg by mouth 3 (three) times daily.  Marland Kitchen UNABLE TO FIND PROTEIN LIQUID- 30 ml 2 TIMES DAILY RELATED TO UNSPECIFIED BUTTOCK  . UNABLE TO FIND MAGIC CUP WITH MEALS 3 TIMES DAILY  . vancomycin IVPB Inject 1,000  mg into the vein daily. Indication: sacral osteomyelitis Last Day of Therapy:  05/14/17 Labs - Sunday/Monday:  CBC/D, BMP, and vancomycin trough. Labs - Thursday:  BMP and vancomycin trough Labs - Every other week:  ESR and CRP  . vitamin C (ASCORBIC ACID) 500 MG tablet Take 500 mg by mouth 2 (two) times daily.  . Vitamin D, Ergocalciferol, (DRISDOL) 50000 units CAPS capsule Take 50,000 Units by mouth every 30 (thirty) days.  . [DISCONTINUED] baclofen (LIORESAL) 20 MG tablet Take 20 mg by mouth 4 (four) times daily.  . [DISCONTINUED] Cholecalciferol 1000 units tablet Take 2,000 Units by mouth daily.  . [DISCONTINUED] HYDROcodone-acetaminophen (NORCO/VICODIN) 5-325 MG tablet Take 1-2 tablets by mouth every 4 (four) hours as needed for moderate pain. (Patient not taking: Reported on 05/09/2017)  . [DISCONTINUED] ibuprofen (ADVIL,MOTRIN) 200 MG tablet Take 1 tablet (200 mg  total) by mouth every 8 (eight) hours as needed for fever, headache or moderate pain. (Patient not taking: Reported on 05/09/2017)   No facility-administered encounter medications on file as of 05/09/2017.     Review of Systems  Neurological: Positive for weakness.  All other systems reviewed and are negative.   Immunization History  Administered Date(s) Administered  . Influenza,inj,Quad PF,6+ Mos 03/06/2016  . Influenza-Unspecified 11/29/2016  . PPD Test 03/31/2016   Pertinent  Health Maintenance Due  Topic Date Due  . INFLUENZA VACCINE  09/01/2017  . PAP SMEAR  Discontinued   Fall Risk  04/18/2017 08/09/2016 02/23/2016  Falls in the past year? No No No   Functional Status Survey:    Vitals:   05/09/17 0934  BP: 138/78  Pulse: 74  Resp: 18  Temp: 97.9 F (36.6 C)  SpO2: 98%  Weight: 92 lb 3.2 oz (41.8 kg)  Height: _0  (1.753 m)   Body mass index is 13.62 kg/m. Physical Exam  Constitutional: She is oriented to person, place, and time. She appears well-developed.  Frail, thin appearing in NAD, lying in bed    HENT:  Mouth/Throat: Oropharynx is clear and moist. No oropharyngeal exudate.  Poor dentition; MMM; no oral thrush  Eyes: Pupils are equal, round, and reactive to light. No scleral icterus.  Neck: Neck supple. Carotid bruit is not present. No tracheal deviation present. No thyromegaly present.  Cardiovascular: Regular rhythm and intact distal pulses. Tachycardia present. Exam reveals no gallop and no friction rub.  Murmur heard.  Systolic murmur is present with a grade of 1/6. No LE edema b/l. no calf TTP.   Pulmonary/Chest: Effort normal and breath sounds normal. No stridor. No respiratory distress. She has no wheezes. She has no rales.  Abdominal: Soft. Normal appearance and bowel sounds are normal. She exhibits no distension and no mass. There is no hepatomegaly. There is no tenderness. There is no rigidity, no rebound and no guarding. No hernia.  (+)urostomy bag intact with clear yellow urine  Musculoskeletal: She exhibits edema and deformity.  LE>UE contractures  Lymphadenopathy:    She has no cervical adenopathy.  Neurological: She is alert and oriented to person, place, and time.  Skin: Skin is warm and dry. No rash noted.  Psychiatric: She has a normal mood and affect. Her behavior is normal. Judgment and thought content normal.    Labs reviewed: Recent Labs    09/10/16 0516  04/09/17 0759  04/10/17 0536 04/11/17 0448 04/23/17 1241  NA 142   < > 144  --  142 141 143  K 3.5   < > 4.3  --  3.8 3.9 4.6  CL 106   < > 110  --  107 106 105  CO2 27   < > 25  --  _1 GLUCOSE 96   < > 89  --  98 101* 88  BUN 9   < > 20  --  19 20 28*  CREATININE <0.30*   < > 0.45  --  0.45 0.49 0.46  CALCIUM 8.9   < > 6.4*   < > 7.6* 8.1* 6.7*  MG 1.8  --  1.6*  --  2.3  --  1.5*  PHOS 3.2  --  6.0*  --  4.6  --   --    < > = values in this interval not displayed.   Recent Labs    04/09/17 0759 04/10/17 0536 04/23/17 1241  AST 19  20 36  ALT _0 ALKPHOS 298* 305* 284*   BILITOT 0.5 0.5 0.5  PROT 6.5 6.5 7.1  ALBUMIN 3.0* 2.9* 3.4*   Recent Labs    03/31/17 04/08/17 1624  04/10/17 0536 04/11/17 0448 04/23/17 1241  WBC 7.5 8.1   < > 9.4 9.3 6.3  NEUTROABS 5 5.6  --   --   --  4.5  HGB 7.4* 5.8*   < > 9.5* 9.7* 9.4*  HCT 22* 18.0*   < > 28.8* 29.9* 29.5*  MCV  --  90.9   < > 88.6 90.9 92.8  PLT 615* 602*   < > 467* 444* 369   < > = values in this interval not displayed.   Lab Results  Component Value Date   TSH 7.806 (H) 04/23/2017   Lab Results  Component Value Date   HGBA1C 5.5 06/24/2016   Lab Results  Component Value Date   CHOL 183 02/24/2017   HDL 55 02/24/2017   LDLCALC 107 02/24/2017   TRIG 105 02/24/2017    Significant Diagnostic Results in last 30 days:  No results found.  Assessment/Plan   ICD-10-CM   1. Acute hematogenous osteomyelitis of multiple sites ([place]) M86.09   2. Protein-calorie malnutrition, severe ([place]) E43   3. Paraplegia (HCC) G82.20   4. Relapsing remitting multiple sclerosis ([place]) G35   5. Unintentional weight loss R63.4   6. Pressure injury of sacral region, stage 4 (HCC) L89.154   7. FTT (failure to thrive) in adult R62.7   8. Anemia of chronic disease D63.8    Cont current meds as ordered. Finish IV abx as ordered  Cont nutritional supplements as ordered  Cont wound care as ordered  PT/OT/ST as ordered  [facility] NP to follow  Will follow  Labs/tests ordered: weekly labs as ordered   [employer]. Perlie Gold  [facility] [address] [place], [place] [address] [phone] Cell (Monday-Friday 8 AM - 5 PM) [phone] After 5 PM and follow prompts

## 2017-05-13 LAB — CBC AND DIFFERENTIAL
HCT: 24 — AB (ref 36–46)
Hemoglobin: 8.1 — AB (ref 12.0–16.0)
Neutrophils Absolute: 6
Platelets: 417 — AB (ref 150–399)
WBC: 7.7

## 2017-05-13 LAB — BASIC METABOLIC PANEL
BUN: 21 (ref 4–21)
CREATININE: 0.4 — AB (ref 0.5–1.1)
GLUCOSE: 133
POTASSIUM: 4.4 (ref 3.4–5.3)
Sodium: 143 (ref 137–147)

## 2017-05-16 LAB — BASIC METABOLIC PANEL
BUN: 22 — AB (ref 4–21)
Creatinine: 0.6 (ref 0.5–1.1)
GLUCOSE: 79
POTASSIUM: 5.1 (ref 3.4–5.3)
SODIUM: 145 (ref 137–147)

## 2017-05-16 LAB — HEPATIC FUNCTION PANEL
ALT: 22 (ref 7–35)
AST: 24 (ref 13–35)
Alkaline Phosphatase: 261 — AB (ref 25–125)
Bilirubin, Total: 0.2

## 2017-05-16 LAB — CBC AND DIFFERENTIAL
HEMATOCRIT: 25 — AB (ref 36–46)
Hemoglobin: 8.5 — AB (ref 12.0–16.0)
Neutrophils Absolute: 5
Platelets: 383 (ref 150–399)
WBC: 7.5

## 2017-05-19 ENCOUNTER — Encounter: Payer: Self-pay | Admitting: Infectious Disease

## 2017-05-19 ENCOUNTER — Ambulatory Visit (INDEPENDENT_AMBULATORY_CARE_PROVIDER_SITE_OTHER): Payer: Medicare Other | Admitting: Infectious Disease

## 2017-05-19 VITALS — BP 95/60 | HR 109 | Temp 99.0°F

## 2017-05-19 DIAGNOSIS — M4628 Osteomyelitis of vertebra, sacral and sacrococcygeal region: Secondary | ICD-10-CM

## 2017-05-19 DIAGNOSIS — L89154 Pressure ulcer of sacral region, stage 4: Secondary | ICD-10-CM | POA: Diagnosis not present

## 2017-05-19 DIAGNOSIS — S31809A Unspecified open wound of unspecified buttock, initial encounter: Secondary | ICD-10-CM

## 2017-05-19 DIAGNOSIS — M86152 Other acute osteomyelitis, left femur: Secondary | ICD-10-CM | POA: Diagnosis not present

## 2017-05-19 DIAGNOSIS — G35 Multiple sclerosis: Secondary | ICD-10-CM | POA: Diagnosis not present

## 2017-05-19 DIAGNOSIS — G8220 Paraplegia, unspecified: Secondary | ICD-10-CM

## 2017-05-19 HISTORY — DX: Osteomyelitis of vertebra, sacral and sacrococcygeal region: M46.28

## 2017-05-19 LAB — HEPATIC FUNCTION PANEL
ALT: 22 (ref 7–35)
AST: 32 (ref 13–35)
Alkaline Phosphatase: 297 — AB (ref 25–125)
BILIRUBIN, TOTAL: 0.2

## 2017-05-19 LAB — BASIC METABOLIC PANEL
BUN: 26 — AB (ref 4–21)
Creatinine: 0.6 (ref 0.5–1.1)
GLUCOSE: 139
POTASSIUM: 5.4 — AB (ref 3.4–5.3)
SODIUM: 143 (ref 137–147)

## 2017-05-19 LAB — POCT ERYTHROCYTE SEDIMENTATION RATE, NON-AUTOMATED: SED RATE: 120

## 2017-05-19 LAB — CBC AND DIFFERENTIAL
HCT: 26 — AB (ref 36–46)
Hemoglobin: 8.8 — AB (ref 12.0–16.0)
NEUTROS ABS: 8
PLATELETS: 432 — AB (ref 150–399)
WBC: 9.8

## 2017-05-19 NOTE — Progress Notes (Signed)
Subjective:   Chief complaint:     Patient ID: Deanna Baldwin, female    DOB: July 30, 1975, 42 y.o.   MRN: [id number]  HPI  42 y.o. 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 We had hoped for I and D by surgery with deep cultures to guide therapy but surgery felt no indication for surgery. We instead have been treating her with empriic vancomycin, ceftriaxone and  Metronidazole orally.  She was DC to SNF and is now back home. She is being followed by wound care closely and states that wounds were getting much smaller.  She appeared dramatically better vs when I saw her int he inpatient world.  Her PICC was clean when I saw her in March though I did not examine her wounds. Today I did and they are not purulent or infected appearing though she still has exposed bone in areas and needs protection of these areas. I would like her seen by Plastic surgery.     Past Medical History:  Diagnosis Date  . Buttock wound 03/03/2016  . Dysrhythmia    tachycardia  . MS (multiple sclerosis) ([place])   . Neutropenia ([place])   . Pneumonia 02/2016  . Protein calorie malnutrition ([place])   . Severe sepsis ([place]) 03/03/2016  . 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 UTI (urinary tract infection) 02/2016    Past Surgical History:  Procedure Laterality Date  . DIVERTING ILEOSTOMY N/A 09/03/2016   Procedure: ILEAL CONDUIT  URINARY DIVERSION OPEN;  Surgeon: Ardis Hughs, MD;  Location: [facility];  Service: Urology;  Laterality: N/A;    Family History  Problem Relation Age of Onset  . Mental illness Sister       Social History   Socioeconomic History  . Marital status: Single    Spouse name: Not on file  . Number of children: Not on file  . Years of education: Not on file  . Highest education level: Not on file    Occupational History  . Occupation: Disabled  Social Needs  . Financial resource strain: Not on file  . Food insecurity:    Worry: Not on file    Inability: Not on file  . Transportation needs:    Medical: Not on file    Non-medical: Not on file  Tobacco Use  . Smoking status: Former Smoker    Packs/day: 1.00    Years: 10.00    Pack years: 10.00    Types: Cigarettes    Last attempt to quit: 02/01/2010    Years since quitting: 7.2  . Smokeless tobacco: Never Used  Substance and Sexual Activity  . Alcohol use: No  . Drug use: No  . Sexual activity: Not on file  Lifestyle  . Physical activity:    Days per week: Not on file    Minutes per session: Not on file  . Stress: Not on file  Relationships  . Social connections:    Talks on phone: Not on file    Gets together: Not on file    Attends religious service: Not on file    Active member of club or organization: Not on file    Attends meetings of clubs or organizations: Not on file    Relationship status: Not on file  Other Topics Concern  . Not on file  Social History Narrative  She is a resident at [facility].   Right-handed.    No Known Allergies   Current Outpatient Medications:  .  acetaminophen (TYLENOL) 325 MG tablet, Take 2 tablets (650 mg total) by mouth every 6 (six) hours as needed for mild pain (or Fever >/= 101)., Disp: , Rfl:  .  bisacodyl (DULCOLAX) 5 MG EC tablet, Take 5 mg by mouth daily., Disp: , Rfl:  .  calcium-vitamin D (OSCAL WITH D) 500-200 MG-UNIT tablet, Take 2 tablets by mouth 4 (four) times daily., Disp: , Rfl:  .  collagenase (SANTYL) ointment, Apply topically daily., Disp: 15 g, Rfl: 0 .  Cyanocobalamin (VITAMIN B-12 PO), Take 1 tablet by mouth daily., Disp: , Rfl:  .  diclofenac sodium (VOLTAREN) 1 % GEL, Apply 2 g topically 3 (three) times daily. Apply to Left shoulder, Disp: , Rfl:  .  ENSURE (ENSURE), Take 237 mLs by mouth. Vanilla, Disp: , Rfl:  .  famotidine (PEPCID) 20  MG tablet, Take 20 mg by mouth every morning., Disp: , Rfl:  .  ferrous sulfate 325 (65 FE) MG tablet, Take 325 mg by mouth daily with breakfast., Disp: , Rfl:  .  guaiFENesin (MUCINEX) 600 MG 12 hr tablet, Take 600 mg by mouth 2 (two) times daily., Disp: , Rfl:  .  HYDROcodone-acetaminophen (NORCO/VICODIN) 5-325 MG tablet, Take 1 tablet by mouth every 4 (four) hours as needed for moderate pain., Disp: , Rfl:  .  ibuprofen (ADVIL,MOTRIN) 200 MG tablet, Take 200 mg by mouth every 12 (twelve) hours as needed., Disp: , Rfl:  .  levothyroxine (SYNTHROID, LEVOTHROID) 25 MCG tablet, Take 1 tablet (25 mcg total) by mouth daily before breakfast., Disp: 30 tablet, Rfl: 0 .  magnesium oxide (MAG-OX) 400 (241.3 Mg) MG tablet, Take 1 tablet (400 mg total) by mouth 2 (two) times daily., Disp: 28 tablet, Rfl: 0 .  midodrine (PROAMATINE) 2.5 MG tablet, Take 1 tablet (2.5 mg total) by mouth 3 (three) times daily with meals., Disp: , Rfl:  .  Multiple Vitamins-Minerals (DECUBI-VITE) CAPS, Take 1 capsule by mouth daily., Disp: , Rfl:  .  ondansetron (ZOFRAN) 4 MG tablet, Take 4 mg by mouth every 6 (six) hours as needed for nausea or vomiting., Disp: , Rfl:  .  polyethylene glycol (MIRALAX / GLYCOLAX) packet, Take 17 g by mouth daily., Disp: , Rfl:  .  Probiotic Product (PROBIOTIC PO), Take 1 tablet by mouth daily., Disp: , Rfl:  .  tizanidine (ZANAFLEX) 2 MG capsule, Take 2 mg by mouth 3 (three) times daily., Disp: , Rfl:  .  UNABLE TO FIND, PROTEIN LIQUID- 30 ml 2 TIMES DAILY RELATED TO UNSPECIFIED BUTTOCK, Disp: , Rfl:  .  UNABLE TO FIND, MAGIC CUP WITH MEALS 3 TIMES DAILY, Disp: , Rfl:  .  vitamin C (ASCORBIC ACID) 500 MG tablet, Take 500 mg by mouth 2 (two) times daily., Disp: , Rfl:  .  Vitamin D, Ergocalciferol, (DRISDOL) 50000 units CAPS capsule, Take 50,000 Units by mouth every 30 (thirty) days., Disp: , Rfl:      Review of Systems  Constitutional: Negative for chills and fever.  HENT: Negative for  congestion and sore throat.   Eyes: Negative for photophobia.  Respiratory: Negative for cough, shortness of breath and wheezing.   Cardiovascular: Negative for chest pain, palpitations and leg swelling.  Gastrointestinal: Negative for abdominal pain, blood in stool, constipation, diarrhea, nausea and vomiting.  Genitourinary: Negative for dysuria, flank pain and hematuria.  Musculoskeletal:  Negative for back pain and myalgias.  Skin: Positive for wound. Negative for rash.  Neurological: Positive for tremors and weakness. Negative for dizziness and headaches.  Hematological: Does not bruise/bleed easily.  Psychiatric/Behavioral: Negative for decreased concentration, dysphoric mood, hallucinations and suicidal ideas. The patient is not nervous/anxious and is not hyperactive.        Objective:   Physical Exam  Constitutional: She is oriented to person, place, and time. She appears well-nourished. No distress.  HENT:  Head: Normocephalic and atraumatic.  Mouth/Throat: Oropharynx is clear and moist. No oropharyngeal exudate.  Eyes: Conjunctivae and EOM are normal. No scleral icterus.  Neck: Normal range of motion. Neck supple.  Cardiovascular: Normal rate, regular rhythm and normal heart sounds. Exam reveals no gallop and no friction rub.  No murmur heard. Pulmonary/Chest: Effort normal. No respiratory distress. She has no wheezes.  Abdominal: Soft. Bowel sounds are normal. She exhibits no distension.  Musculoskeletal: She exhibits no edema or tenderness.  Neurological: She is alert and oriented to person, place, and time. Coordination abnormal.  Skin: Skin is warm and dry. No rash noted. She is not diaphoretic. No erythema.  Psychiatric: She has a normal mood and affect. Her behavior is normal. Judgment and thought content normal.   PICC is clean 04/18/17:     05/19/17:      Decubitus ulcers  Right buttocks  05/19/17: granulation tissue and no purulence. 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 Marla Roe) to optimize her wound care and possibility of coverage of these wounds with skin graft??  Complete 8 week course of IV abx Have her see my prior to stop date Wound Care to continue to follow closely  MS: much improved.  I spent greater than 25 minutes with the patient including greater than 50% of time in face to face counsel of the patient re importance of continued vigilant wound care, referral to plastics  and in coordination of her care.

## 2017-05-23 ENCOUNTER — Non-Acute Institutional Stay: Payer: Medicare Other | Admitting: Internal Medicine

## 2017-05-23 ENCOUNTER — Encounter: Payer: Self-pay | Admitting: Internal Medicine

## 2017-05-23 VITALS — BP 86/52 | HR 104 | Resp 16

## 2017-05-23 DIAGNOSIS — R000 Tachycardia, unspecified: Secondary | ICD-10-CM

## 2017-05-23 DIAGNOSIS — Z936 Other artificial openings of urinary tract status: Secondary | ICD-10-CM | POA: Insufficient documentation

## 2017-05-23 DIAGNOSIS — G894 Chronic pain syndrome: Secondary | ICD-10-CM

## 2017-05-23 DIAGNOSIS — Z7189 Other specified counseling: Secondary | ICD-10-CM

## 2017-05-23 DIAGNOSIS — E440 Moderate protein-calorie malnutrition: Secondary | ICD-10-CM

## 2017-05-23 DIAGNOSIS — I959 Hypotension, unspecified: Secondary | ICD-10-CM

## 2017-05-23 DIAGNOSIS — G8220 Paraplegia, unspecified: Secondary | ICD-10-CM

## 2017-05-23 DIAGNOSIS — G35 Multiple sclerosis: Secondary | ICD-10-CM

## 2017-05-23 DIAGNOSIS — L89154 Pressure ulcer of sacral region, stage 4: Secondary | ICD-10-CM

## 2017-05-23 DIAGNOSIS — N319 Neuromuscular dysfunction of bladder, unspecified: Secondary | ICD-10-CM

## 2017-05-23 LAB — HEPATIC FUNCTION PANEL
ALK PHOS: 559 — AB (ref 25–125)
ALT: 156 — AB (ref 7–35)
AST: 230 — AB (ref 13–35)
BILIRUBIN, TOTAL: 0.2

## 2017-05-23 LAB — BASIC METABOLIC PANEL
BUN: 28 — AB (ref 4–21)
Creatinine: 0.5 (ref 0.5–1.1)
Glucose: 88
Potassium: 5 (ref 3.4–5.3)
Sodium: 146 (ref 137–147)

## 2017-05-23 NOTE — Progress Notes (Addendum)
Palliative Care SW attended visit with NP, Violeta Gelinas, at [facility].  Pt was lying in bed.  She was alert and oriented x4.  She denied pain.  Reviewed goals of care.  Pt has MOST form.  She stated she would like to return home, but understands she must be mobile.  She has a Neurologist appointment next week.  She stated she would like a shot to make her legs less contracted.  Pt has a 41 y/o son who lives with his father in [place].  Pt expressed having a good support system.

## 2017-05-23 NOTE — Progress Notes (Signed)
INITIAL PALLIATIVE CARE CONSULT VISIT   PATIENT NAME: Deanna Baldwin DOB: August 21, 1975 MRN: [id number]  PRIMARY CARE PROVIDER: [name], DO  REFERRING PROVIDER: [name], DO [place], [place] [address]  RESPONSIBLE PARTY:  Acct ID - Guarantor Home Phone Work Phone Relationship Acct Type  [id number] - [id number] [phone]  Self P/F     [place], [place], [place] [address]   BILLABLE  ICD-10: Pressure injury; Sacrum [phone]) HCPOA: Zebedee Iba (Sister), [phone]. Other family: Venora Maples [facility]), [phone], [phone].     RECOMMENDATIONS and PLAN:  1. Sacral decubitus stage IV: daily dressing change with wound wash/ calcium alginate./medihoney per facility protocol. Improving. Plastics to consult (Dr. Marla Roe) 2. Multiple Sclerosis/spastic paraplegia: Zanaflex tid. [facility] following, with scheduled  Ocrevus infusions and Xeomin injections. Transfers about 3 X/wk out of bed into chair via [facility]. Severe bilateral LE contractures. 3. Protein calorie malnutrition: Current weight 97 pounds. Tolerating a regular diet;  Ensure tid, and magic cup (protein supplement) bid 4. Chronic musculoskeletal pain: Well managed on current regimen of tylenol 625mg   tid, use of voltaren gel 2 gm to left shoulder tid, and motrin 200 mg bid prn. 5. Chronic tachycardia with low blood pressure: Stable on midodrine 2.5 mg tid. 6. Neurogenic Bladder: diverting urostomy intact. 7. Advanced Care Planning:  Has MOST form on chart. We reviewed and confirmed her documented desires of: Attempt CPR, Full Scope of Treatment, Antibiotic use to be determined at the time of infection, IVFs if indicated, and No feeding tube. 8. Patient Goals: to return to her own apartment, and regain her independence.  9. Palliative Care follow up : in 1-2 months.  I spent 45 minutes providing this consultation (from 11am to 11:45am). More than 50% of that time was spent  coordinating communication.   HISTORY OF PRESENT ILLNESS:   HPI: HPI: 42 yo female resident of [place], with multiple sclerosis/spastic paraplegia, a diverting urostomy, and  Protein Calorie Malnutrition. She has a h/o multiple UTI's, pneumonia, and anemia.  She is status post 2 hospitalizations this year;  most  recently discharged  04/11/17 after 3 day stay for treatment of anemia (Hgb 5.8; 9.7 after transfusion of 2 u's PRBCs). Earlier discharge Feb 19th; treatment for sacral osteomyelitis, and left ischial ulcer with surrounding myositis. Recently f/u  by Dr. Rhina Brackett Dam (05/19/17) of Infectious Disease, who  feels wounds are much improved,  and has referred patient for Plastic Surgery Consult. Now referred to Palliative Care for assistance with symptom management, help with coordination of resources, and ongoing discussions regarding goals of care/advanced directives.  CODE STATUS: Full Code. MOST form on chart: Attempt CPR, Full Scope of Treatment, Antibiotic use to be determined at the time of infection, IVFs if indicated, and No feeding tube.  ADVANCED DIRECTIVES: Full Code. MOST form on chart: Attempt CPR, Full Scope of Treatment, Antibiotic use to be determined at the time of infection, IVFs if indicated, and No feeding tube.  PPS: 30% HOSPICE ELIGIBILITY/DIAGNOSIS: No  PAST MEDICAL HISTORY:  Past Medical History:  Diagnosis Date  . Buttock wound 03/03/2016  . Dysrhythmia    tachycardia  . MS (multiple sclerosis) ([place])   . Neutropenia ([place])   . Pneumonia 02/2016  . Protein calorie malnutrition ([place])   . Sacral osteomyelitis ([place]) 05/19/2017  . Severe sepsis ([place]) 03/03/2016  . Spastic 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 ([place])   . UTI (urinary tract infection) 02/2016    SOCIAL HX:  Social History  Tobacco Use  . Smoking status: Former Smoker    Packs/day: 1.00    Years: 10.00    Pack years: 10.00    Types: Cigarettes    Last attempt to quit: 02/01/2010     Years since quitting: 7.3  . Smokeless tobacco: Never Used  Substance Use Topics  . Alcohol use: No    ALLERGIES: No Known Allergies   REVIEWED: Yes PERTINENT MEDICATIONS:  Outpatient Encounter Medications as of 05/23/2017  Medication Sig  . acetaminophen (TYLENOL) 325 MG tablet Take 2 tablets (650 mg total) by mouth every 6 (six) hours as needed for mild pain (or Fever >/= 101).  . bisacodyl (DULCOLAX) 5 MG EC tablet Take 5 mg by mouth daily.  . calcium-vitamin D (OSCAL WITH D) 500-200 MG-UNIT tablet Take 2 tablets by mouth 4 (four) times daily.  . collagenase (SANTYL) ointment Apply topically daily.  . Cyanocobalamin (VITAMIN B-12 PO) Take 1 tablet by mouth daily.  . diclofenac sodium (VOLTAREN) 1 % GEL Apply 2 g topically 3 (three) times daily. Apply to Left shoulder  . ENSURE (ENSURE) Take 237 mLs by mouth. Vanilla  . famotidine (PEPCID) 20 MG tablet Take 20 mg by mouth every morning.  . ferrous sulfate 325 (65 FE) MG tablet Take 325 mg by mouth daily with breakfast.  . guaiFENesin (MUCINEX) 600 MG 12 hr tablet Take 600 mg by mouth 2 (two) times daily.  Marland Kitchen HYDROcodone-acetaminophen (NORCO/VICODIN) 5-325 MG tablet Take 1 tablet by mouth every 4 (four) hours as needed for moderate pain.  Marland Kitchen ibuprofen (ADVIL,MOTRIN) 200 MG tablet Take 200 mg by mouth every 12 (twelve) hours as needed.  Marland Kitchen levothyroxine (SYNTHROID, LEVOTHROID) 25 MCG tablet Take 1 tablet (25 mcg total) by mouth daily before breakfast.  . magnesium oxide (MAG-OX) 400 (241.3 Mg) MG tablet Take 1 tablet (400 mg total) by mouth 2 (two) times daily.  . midodrine (PROAMATINE) 2.5 MG tablet Take 1 tablet (2.5 mg total) by mouth 3 (three) times daily with meals.  . Multiple Vitamins-Minerals (DECUBI-VITE) CAPS Take 1 capsule by mouth daily.  . ondansetron (ZOFRAN) 4 MG tablet Take 4 mg by mouth every 6 (six) hours as needed for nausea or vomiting.  . polyethylene glycol (MIRALAX / GLYCOLAX) packet Take 17 g by mouth daily.  .  Probiotic Product (PROBIOTIC PO) Take 1 tablet by mouth daily.  . tizanidine (ZANAFLEX) 2 MG capsule Take 2 mg by mouth 3 (three) times daily.  Marland Kitchen UNABLE TO FIND PROTEIN LIQUID- 30 ml 2 TIMES DAILY RELATED TO UNSPECIFIED BUTTOCK  . UNABLE TO FIND MAGIC CUP WITH MEALS 3 TIMES DAILY  . vitamin C (ASCORBIC ACID) 500 MG tablet Take 500 mg by mouth 2 (two) times daily.  . Vitamin D, Ergocalciferol, (DRISDOL) 50000 units CAPS capsule Take 50,000 Units by mouth every 30 (thirty) days.   No facility-administered encounter medications on file as of 05/23/2017.    REVIEWED MEDICATIONS: Yes  PHYSICAL EXAM:  PHYSICAL EXAM: Very thin, pleasantly chatty; NAD. VS: 86/52, 97% room air, HR 104, RR 16.  HEENT: [place], AT LUNGS: CTA bilaterally CARDIAC:  RRR without MRG ABD: soft, non-distended, NAB EXTREMITIES:  peripheral pulses intact, no LE edema MUSCULOSKELETAL:  BLE contractures hips/knees SKIN: Warm, dry and intact. NEURO: A & O x 3.    Julianne Handler, NP-C

## 2017-05-30 LAB — POCT ERYTHROCYTE SEDIMENTATION RATE, NON-AUTOMATED: Sed Rate: 110

## 2017-05-30 LAB — CBC AND DIFFERENTIAL
HCT: 25 — AB (ref 36–46)
HEMOGLOBIN: 8.5 — AB (ref 12.0–16.0)
Neutrophils Absolute: 4
PLATELETS: 395 (ref 150–399)
WBC: 6

## 2017-05-31 ENCOUNTER — Telehealth: Payer: Self-pay | Admitting: Neurology

## 2017-05-31 NOTE — Telephone Encounter (Signed)
[facility] benefits; [phone] and [phone]- coverage at 80 percent, but patient will pay zero dollars with Medicaid as a secondary. NPR is required. [id number]. DW   [facility] ID: [id number]  Medicaid ID: [id number] P

## 2017-06-08 ENCOUNTER — Ambulatory Visit: Payer: Medicare Other | Admitting: Neurology

## 2017-06-29 LAB — BASIC METABOLIC PANEL
BUN: 30 — AB (ref 4–21)
Creatinine: 0.6 (ref 0.5–1.1)
Glucose: 142
POTASSIUM: 4.7 (ref 3.4–5.3)
SODIUM: 142 (ref 137–147)

## 2017-06-29 LAB — CBC AND DIFFERENTIAL
HCT: 27 — AB (ref 36–46)
HEMOGLOBIN: 9 — AB (ref 12.0–16.0)
Neutrophils Absolute: 4
PLATELETS: 439 — AB (ref 150–399)
WBC: 5.5

## 2017-06-29 LAB — HEPATIC FUNCTION PANEL
ALT: 88 — AB (ref 7–35)
AST: 70 — AB (ref 13–35)
Alkaline Phosphatase: 999 — AB (ref 25–125)
Bilirubin, Total: 0.2

## 2017-06-29 LAB — TSH: TSH: 37.8 — AB (ref 0.41–5.90)

## 2017-06-29 LAB — POCT ERYTHROCYTE SEDIMENTATION RATE, NON-AUTOMATED: Sed Rate: 101

## 2017-07-04 DIAGNOSIS — G35 Multiple sclerosis: Secondary | ICD-10-CM | POA: Diagnosis not present

## 2017-07-06 ENCOUNTER — Telehealth: Payer: Self-pay | Admitting: Neurology

## 2017-07-06 NOTE — Telephone Encounter (Signed)
Patient's insurance has recently changed, it will need to be verified will the living facility she is in. Once verified injections and drug code will need to be checked with the insurance for benefits and pre-cert requirements. The living facility can give ID numbers and phone numbers for the insurance. DW

## 2017-07-07 ENCOUNTER — Encounter: Payer: Self-pay | Admitting: Internal Medicine

## 2017-07-07 ENCOUNTER — Non-Acute Institutional Stay (SKILLED_NURSING_FACILITY): Payer: Medicare Other | Admitting: Internal Medicine

## 2017-07-07 DIAGNOSIS — E43 Unspecified severe protein-calorie malnutrition: Secondary | ICD-10-CM | POA: Diagnosis not present

## 2017-07-07 DIAGNOSIS — L89324 Pressure ulcer of left buttock, stage 4: Secondary | ICD-10-CM

## 2017-07-07 DIAGNOSIS — G8220 Paraplegia, unspecified: Secondary | ICD-10-CM | POA: Diagnosis not present

## 2017-07-07 DIAGNOSIS — L89154 Pressure ulcer of sacral region, stage 4: Secondary | ICD-10-CM | POA: Diagnosis not present

## 2017-07-07 DIAGNOSIS — Z936 Other artificial openings of urinary tract status: Secondary | ICD-10-CM

## 2017-07-07 DIAGNOSIS — N319 Neuromuscular dysfunction of bladder, unspecified: Secondary | ICD-10-CM | POA: Diagnosis not present

## 2017-07-07 DIAGNOSIS — G35 Multiple sclerosis: Secondary | ICD-10-CM

## 2017-07-07 DIAGNOSIS — R627 Adult failure to thrive: Secondary | ICD-10-CM

## 2017-07-07 DIAGNOSIS — R748 Abnormal levels of other serum enzymes: Secondary | ICD-10-CM

## 2017-07-07 DIAGNOSIS — L89314 Pressure ulcer of right buttock, stage 4: Secondary | ICD-10-CM | POA: Diagnosis not present

## 2017-07-07 NOTE — Progress Notes (Signed)
Patient ID: Deanna Baldwin, female   DOB: [birth year], 42 y.o.   MRN: [id number]  Location:  [facility] Room Number: 215 A Place of Service:  SNF (31) Provider:  [place], [place], DO  Patient Care Team: [name], DO as PCP - General (Internal Medicine) Center, [place] ([place])  Extended Emergency Contact Information Primary Emergency Contact: Dalbert May[place], [place] [address] Johnnette Litter of [place] Phone: [phone] Mobile Phone: [phone] Relation: Aunt Secondary Emergency Contact: Smith,Crystal  [place] Mobile Phone: [phone] Relation: Sister  Code Status:  Full Code Goals of care: Advanced Directive information Advanced Directives 07/07/2017  Does Patient Have a Medical Advance Directive? Yes  Type of Advance Directive Out of facility DNR (pink MOST or yellow form)  Does patient want to make changes to medical advance directive? No - Patient declined  Copy of [place] in Chart? -  Would patient like information on creating a medical advance directive? -  Pre-existing out of facility DNR order (yellow form or pink MOST form) Pink MOST form placed in chart (order not valid for inpatient use)     Chief Complaint  Patient presents with  . Medical Management of Chronic Issues    [facility]    HPI:  Pt is a 42 y.o. female seen today for medical management of chronic diseases.  She reports wounds are healing. She saw ID in April 2019 and was recommended to f/u with plastic sx to optimize her wound care and possibly cover wounds with skin graft. She did complete 8 wks of IV abx. She rec'd her IV MS (rituxan) med on 07/04/17 at neurology office ([place]). No issues post infusion.  Chronic constipation - stable on miralax daily and dulcolax 5 mg tab daily   Anemia of chronic disease - 2/2  iron deficiency; last PRBC transfusion  04/2016; Hgb 8.2; takes  iron twice daily  Elevated liver enzymes (ast; alt; alk phos) -  CD 19 and 20 are neg; GGT and abdominal US neg; AST 230 (<--32); ALT 156 (<--22); alk phos 559 (<--297)  Tachycardia with low blood pressure - stable on midodrine 2.5 mg three times daily  Chronic pain syndrome - stable on tylenol 1 gm three times daily; voltaren gel 2 gm to left shoulder three times daily; motrin 200 mg twice daily   MS - controlled on rituxan infusion every 6 mos;  followed by neurology; she has bilateral lower extremity splints. She is allergic to Tysabri;   Spastic paraplegia - stable on baclofen 20 mg four times daily ; uses bilateral splints  Multiple stage 4 wounds (right ischial, left ischial, coccyx) - followed by facility wound care provider  Neurogenic bladder - she has bladder neck erosion and has urostomy with foley cath DTG  Protein calorie malnutrition - severe; weight stable. albumin 3.4. She gets nutritional supplements per facility protocol; off remeron     Past Medical History:  Diagnosis Date  . Buttock wound 03/03/2016  . Dysrhythmia    tachycardia  . MS (multiple sclerosis) ([place])   . Neutropenia ([place])   . Pneumonia 02/2016  . Protein calorie malnutrition ([place])   . Sacral osteomyelitis ([place]) 05/19/2017  . Severe sepsis ([place]) 03/03/2016  . Spastic 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 ([place])   . UTI (urinary tract infection) 02/2016   Past Surgical History:  Procedure Laterality Date  . DIVERTING ILEOSTOMY N/A  09/03/2016   Procedure: ILEAL CONDUIT  URINARY DIVERSION OPEN;  Surgeon: Ardis Hughs, MD;  Location: [facility];  Service: Urology;  Laterality: N/A;    No Known Allergies  Outpatient Encounter Medications as of 07/07/2017  Medication Sig  . acetaminophen (TYLENOL) 325 MG tablet Take 2 tablets (650 mg total) by mouth every 6 (six) hours as needed for mild pain (or Fever >/= 101).  . bisacodyl (DULCOLAX) 5 MG EC tablet Take 5 mg by mouth daily.  Marland Kitchen CALCIUM ALGINATE  EX Apply to coccyx and right ischial daily  . calcium-vitamin D (OSCAL WITH D) 500-200 MG-UNIT tablet Take 2 tablets by mouth 4 (four) times daily.  . Cyanocobalamin (VITAMIN B-12 PO) Take 1 tablet by mouth daily.  Marland Kitchen ENSURE (ENSURE) Take 237 mLs by mouth. With meals. Vanilla  . famotidine (PEPCID) 20 MG tablet Take 20 mg by mouth every morning.  . ferrous sulfate 325 (65 FE) MG tablet Take 325 mg by mouth daily with breakfast.  . guaiFENesin (MUCINEX) 600 MG 12 hr tablet Take 600 mg by mouth 2 (two) times daily.  Marland Kitchen HYDROcodone-acetaminophen (NORCO/VICODIN) 5-325 MG tablet Take 1 tablet by mouth every 4 (four) hours as needed for moderate pain.  Marland Kitchen ibuprofen (ADVIL,MOTRIN) 200 MG tablet Take 200 mg by mouth every 12 (twelve) hours as needed.  Marland Kitchen levothyroxine (SYNTHROID) 50 MCG tablet Take 50 mcg by mouth daily before breakfast.  . magnesium oxide (MAG-OX) 400 (241.3 Mg) MG tablet Take 1 tablet (400 mg total) by mouth 2 (two) times daily.  . midodrine (PROAMATINE) 2.5 MG tablet Take 1 tablet (2.5 mg total) by mouth 3 (three) times daily with meals.  . Multiple Vitamin (MULTIVITAMIN) tablet Take 1 tablet by mouth daily.  . Nutritional Supplements (NUTRITIONAL SUPPLEMENT PO) Frozen Nutritional Treat with meals  . Nutritional Supplements (PROMOD) LIQD Protein Liquid - give 30 ml by mouth two times daily  . ondansetron (ZOFRAN) 4 MG tablet Take 4 mg by mouth every 6 (six) hours as needed for nausea or vomiting.  . polyethylene glycol (MIRALAX / GLYCOLAX) packet Take 17 g by mouth daily.  . Probiotic Product (PROBIOTIC PO) Take 1 tablet by mouth daily.  . tizanidine (ZANAFLEX) 2 MG capsule Take 2 mg by mouth 3 (three) times daily.  Marland Kitchen UNABLE TO FIND PROTEIN LIQUID- 30 ml 2 TIMES DAILY RELATED TO UNSPECIFIED BUTTOCK  . Vitamin D, Ergocalciferol, (DRISDOL) 50000 units CAPS capsule Take 50,000 Units by mouth every 30 (thirty) days.  . [DISCONTINUED] collagenase (SANTYL) ointment Apply topically daily.  (Patient not taking: Reported on 07/07/2017)  . [DISCONTINUED] diclofenac sodium (VOLTAREN) 1 % GEL Apply 2 g topically 3 (three) times daily. Apply to Left shoulder  . [DISCONTINUED] levothyroxine (SYNTHROID, LEVOTHROID) 25 MCG tablet Take 1 tablet (25 mcg total) by mouth daily before breakfast. (Patient not taking: Reported on 07/07/2017)  . [DISCONTINUED] Multiple Vitamins-Minerals (DECUBI-VITE) CAPS Take 1 capsule by mouth daily.  . [DISCONTINUED] UNABLE TO FIND MAGIC CUP WITH MEALS 3 TIMES DAILY  . [DISCONTINUED] vitamin C (ASCORBIC ACID) 500 MG tablet Take 500 mg by mouth 2 (two) times daily.   No facility-administered encounter medications on file as of 07/07/2017.     Review of Systems  Skin: Positive for wound.  Neurological: Positive for weakness.  All other systems reviewed and are negative.   Immunization History  Administered Date(s) Administered  . Influenza,inj,Quad PF,6+ Mos 03/06/2016  . Influenza-Unspecified 11/29/2016  . PPD Test 03/31/2016   Pertinent  Health Maintenance Due  Topic Date Due  . INFLUENZA VACCINE  09/01/2017  . PAP SMEAR  Discontinued   Fall Risk  04/18/2017 08/09/2016 02/23/2016  Falls in the past year? No No No   Functional Status Survey:    Vitals:   07/07/17 0945  BP: 128/80  Pulse: 80  Resp: 18  Temp: 97.9 F (36.6 C)  SpO2: 98%  Weight: 90 lb 3.2 oz (40.9 kg)  Height: _0  (1.753 m)   Body mass index is 13.32 kg/m. Physical Exam  Constitutional: She is oriented to person, place, and time. She appears well-developed and well-nourished.  Frail appearing in NAD, lying in bed  HENT:  Mouth/Throat: Oropharynx is clear and moist. No oropharyngeal exudate.  MMM; no oral thrush  Eyes: Pupils are equal, round, and reactive to light. No scleral icterus.  Neck: Neck supple. Carotid bruit is not present. No tracheal deviation present. No thyromegaly present.  Cardiovascular: Normal rate, regular rhythm and intact distal pulses. Exam reveals no  gallop and no friction rub.  Murmur (1/6 SEM) heard. No LE edema b/l. no calf TTP.   Pulmonary/Chest: Effort normal and breath sounds normal. No stridor. No respiratory distress. She has no wheezes. She has no rales.  Abdominal: Soft. Normal appearance and bowel sounds are normal. She exhibits no distension and no mass. There is no hepatomegaly. There is no tenderness. There is no rigidity, no rebound and no guarding. No hernia.  Genitourinary:  Genitourinary Comments: Urostomy intact with Foley cath DTG clear yellow urine  Musculoskeletal: She exhibits deformity (multiple extremity contractures).  Lymphadenopathy:    She has no cervical adenopathy.  Neurological: She is alert and oriented to person, place, and time. She has normal reflexes.  Skin: Skin is warm and dry. No rash noted.  Multiple wounds followed by facility wound care provider - 1)stage 4 right ischial decreased in size (1.2 cm x 1.2 cm) with granulation to wound bed; no purulent d/c or odor 2) stage 4 left ischial 5.5 cm x 8 mm with eschar, slough and granulation to bed 3)stage 4 coccyx is 2 mm x 3.2 cm with slough and granulation to bed  Psychiatric: She has a normal mood and affect. Her behavior is normal. Judgment and thought content normal.    Labs reviewed: Recent Labs    09/10/16 0516  04/09/17 0759  04/10/17 0536 04/11/17 0448 04/23/17 1241  05/16/17 05/19/17 05/23/17  NA 142   < > 144  --  142 141 143   < > 145 143 146  K 3.5   < > 4.3  --  3.8 3.9 4.6   < > 5.1 5.4* 5.0  CL 106   < > 110  --  107 106 105  --   --   --   --   CO2 27   < > 25  --  _1 --   --   --   --   GLUCOSE 96   < > 89  --  98 101* 88  --   --   --   --   BUN 9   < > 20  --  19 20 28*   < > 22* 26* 28*  CREATININE <0.30*   < > 0.45  --  0.45 0.49 0.46   < > 0.6 0.6 0.5  CALCIUM 8.9   < > 6.4*   < > 7.6* 8.1* 6.7*  --   --   --   --  MG 1.8  --  1.6*  --  2.3  --  1.5*  --   --   --   --   PHOS 3.2  --  6.0*  --  4.6  --   --   --    --   --   --    < > = values in this interval not displayed.   Recent Labs    04/09/17 0759 04/10/17 0536 04/23/17 1241  05/16/17 05/19/17 05/23/17  AST 19 20 36   < > 24 32 230*  ALT _0 < > 22 22 156*  ALKPHOS 298* 305* 284*   < > 261* 297* 559*  BILITOT 0.5 0.5 0.5  --   --   --   --   PROT 6.5 6.5 7.1  --   --   --   --   ALBUMIN 3.0* 2.9* 3.4*  --   --   --   --    < > = values in this interval not displayed.   Recent Labs    04/10/17 0536 04/11/17 0448 04/23/17 1241  05/16/17 05/19/17 05/30/17  WBC 9.4 9.3 6.3   < > 7.5 9.8 6.0  NEUTROABS  --   --  4.5   < > _1 HGB 9.5* 9.7* 9.4*   < > 8.5* 8.8* 8.5*  HCT 28.8* 29.9* 29.5*   < > 25* 26* 25*  MCV 88.6 90.9 92.8  --   --   --   --   PLT 467* 444* 369   < > 383 432* 395   < > = values in this interval not displayed.   Lab Results  Component Value Date   TSH 13.50 (A) 05/09/2017   Lab Results  Component Value Date   HGBA1C 5.5 06/24/2016   Lab Results  Component Value Date   CHOL 183 02/24/2017   HDL 55 02/24/2017   LDLCALC 107 02/24/2017   TRIG 105 02/24/2017    Significant Diagnostic Results in last 30 days:  No results found.  Assessment/Plan   ICD-10-CM   1. Relapsing remitting multiple sclerosis ([place]) G35   2. FTT (failure to thrive) in adult R62.7   3. Protein-calorie malnutrition, severe ([employer]) E43   4. Pressure injury of sacral region, stage 4 (HCC) L89.154   5. Pressure sore of left ischium, stage 4 (HCC) L89.324   6. Pressure injury of right ischium, stage 4 ([place]) L89.314   7. 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 Neurogenic bladder N31.9    s/p urostomy; foley cath DTG  9. 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HCC) Z93.6   10. Elevated liver enzymes R74.8    Cont current meds as ordered  PT/OT/ST as indicated  Wound care as ordered  Cont nutritional supplements as ordered  F/u with specialists as scheduled  [facility] NP to follow  Will  follow  Labs/tests ordered: will need repeat hepatic function to follow liver enzymes   Dorisann Schwanke S. Perlie Gold  [facility] [address] [place], [place] [address] [phone] Cell (Monday-Friday 8 AM - 5 PM) [phone] After 5 PM and follow prompts

## 2017-07-13 ENCOUNTER — Encounter: Payer: Self-pay | Admitting: Infectious Disease

## 2017-07-13 ENCOUNTER — Ambulatory Visit (INDEPENDENT_AMBULATORY_CARE_PROVIDER_SITE_OTHER): Payer: Medicare Other | Admitting: Infectious Disease

## 2017-07-13 VITALS — BP 107/66 | HR 103 | Temp 98.3°F

## 2017-07-13 DIAGNOSIS — A419 Sepsis, unspecified organism: Secondary | ICD-10-CM

## 2017-07-13 DIAGNOSIS — M86652 Other chronic osteomyelitis, left thigh: Secondary | ICD-10-CM

## 2017-07-13 DIAGNOSIS — N390 Urinary tract infection, site not specified: Secondary | ICD-10-CM | POA: Diagnosis not present

## 2017-07-13 DIAGNOSIS — M86152 Other acute osteomyelitis, left femur: Secondary | ICD-10-CM | POA: Diagnosis not present

## 2017-07-13 DIAGNOSIS — M8669 Other chronic osteomyelitis, multiple sites: Secondary | ICD-10-CM

## 2017-07-13 DIAGNOSIS — G8250 Quadriplegia, unspecified: Secondary | ICD-10-CM | POA: Diagnosis not present

## 2017-07-13 DIAGNOSIS — L89154 Pressure ulcer of sacral region, stage 4: Secondary | ICD-10-CM | POA: Diagnosis not present

## 2017-07-13 LAB — VITAMIN B12: Vitamin B-12: 948

## 2017-07-13 LAB — VITAMIN D 25 HYDROXY (VIT D DEFICIENCY, FRACTURES): Vit D, 25-Hydroxy: 27.47

## 2017-07-13 NOTE — Progress Notes (Signed)
Subjective:   Chief complaint: "The sore on my left but is getting worse than the one on my right"    Patient ID: Deanna Baldwin, female    DOB: 1975/02/22, 42 y.o.   MRN: [id number]  HPI  42 y.o. 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 We had hoped for I and D by surgery with deep cultures to guide therapy but surgery felt no indication for surgery. We instead have been treating her with empriic vancomycin, ceftriaxone and  Metronidazole orally.  She was DC to SNF and is now back home. She is being followed by wound care closely and states that wounds were getting much smaller.  She appeared dramatically better vs when I saw her int he inpatient world.  Her PICC was clean when I saw her in March though I did not examine her wounds. When I last examined them there was exposed bone and I was esp worried about her. Her ESR was also very high I have asked that she be sent to plastic surgery several times but this is never happened.    It is not clear to me that the bed that she sleeps on is a bed that changes pressure points.  She also lies on her left side for extended periods of time where she has the worsening ulcer.  She does not fevers nausea or other systemic symptoms but given the fact that there is exposed bone particular on the left she is at high risk for developing an acute infection from her chronic osteomyelitis.    Past Medical History:  Diagnosis Date  . Buttock wound 03/03/2016  . Dysrhythmia    tachycardia  . MS (multiple sclerosis) ([place])   . Neutropenia ([place])   . Pneumonia 02/2016  . Protein calorie malnutrition ([place])   . Sacral osteomyelitis ([place]) 05/19/2017  . Severe sepsis ([place]) 03/03/2016  . Spastic 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 ([place])   . UTI (urinary tract infection) 02/2016     Past Surgical History:  Procedure Laterality Date  . DIVERTING ILEOSTOMY N/A 09/03/2016   Procedure: ILEAL CONDUIT  URINARY DIVERSION OPEN;  Surgeon: Ardis Hughs, MD;  Location: [facility];  Service: Urology;  Laterality: N/A;    Family History  Problem Relation Age of Onset  . Mental illness Sister       Social History   Socioeconomic History  . Marital status: Single    Spouse name: Not on file  . Number of children: Not on file  . Years of education: Not on file  . Highest education level: Not on file  Occupational History  . Occupation: Disabled  Social Needs  . Financial resource strain: Not on file  . Food insecurity:    Worry: Not on file    Inability: Not on file  . Transportation needs:    Medical: Not on file    Non-medical: Not on file  Tobacco Use  . Smoking status: Former Smoker    Packs/day: 1.00    Years: 10.00    Pack years: 10.00    Types: Cigarettes    Last attempt to quit: 02/01/2010    Years since quitting: 7.4  . Smokeless tobacco: Never Used  Substance and Sexual Activity  . Alcohol use: No  . Drug use: No  . Sexual activity: Not on file  Lifestyle  . Physical  activity:    Days per week: Not on file    Minutes per session: Not on file  . Stress: Not on file  Relationships  . Social connections:    Talks on phone: Not on file    Gets together: Not on file    Attends religious service: Not on file    Active member of club or organization: Not on file    Attends meetings of clubs or organizations: Not on file    Relationship status: Not on file  Other Topics Concern  . Not on file  Social History Narrative   She is a resident at [facility].   Right-handed.    No Known Allergies   Current Outpatient Medications:  .  acetaminophen (TYLENOL) 325 MG tablet, Take 2 tablets (650 mg total) by mouth every 6 (six) hours as needed for mild pain (or Fever >/= 101)., Disp: , Rfl:  .  bisacodyl (DULCOLAX) 5 MG EC tablet, Take  5 mg by mouth daily., Disp: , Rfl:  .  CALCIUM ALGINATE EX, Apply to coccyx and right ischial daily, Disp: , Rfl:  .  calcium-vitamin D (OSCAL WITH D) 500-200 MG-UNIT tablet, Take 2 tablets by mouth 4 (four) times daily., Disp: , Rfl:  .  Cyanocobalamin (VITAMIN B-12 PO), Take 1 tablet by mouth daily., Disp: , Rfl:  .  ENSURE (ENSURE), Take 237 mLs by mouth. With meals. Vanilla, Disp: , Rfl:  .  famotidine (PEPCID) 20 MG tablet, Take 20 mg by mouth every morning., Disp: , Rfl:  .  ferrous sulfate 325 (65 FE) MG tablet, Take 325 mg by mouth daily with breakfast., Disp: , Rfl:  .  guaiFENesin (MUCINEX) 600 MG 12 hr tablet, Take 600 mg by mouth 2 (two) times daily., Disp: , Rfl:  .  HYDROcodone-acetaminophen (NORCO/VICODIN) 5-325 MG tablet, Take 1 tablet by mouth every 4 (four) hours as needed for moderate pain., Disp: , Rfl:  .  ibuprofen (ADVIL,MOTRIN) 200 MG tablet, Take 200 mg by mouth every 12 (twelve) hours as needed., Disp: , Rfl:  .  levothyroxine (SYNTHROID) 50 MCG tablet, Take 50 mcg by mouth daily before breakfast., Disp: , Rfl:  .  magnesium oxide (MAG-OX) 400 (241.3 Mg) MG tablet, Take 1 tablet (400 mg total) by mouth 2 (two) times daily., Disp: 28 tablet, Rfl: 0 .  midodrine (PROAMATINE) 2.5 MG tablet, Take 1 tablet (2.5 mg total) by mouth 3 (three) times daily with meals., Disp: , Rfl:  .  Multiple Vitamin (MULTIVITAMIN) tablet, Take 1 tablet by mouth daily., Disp: , Rfl:  .  Nutritional Supplements (NUTRITIONAL SUPPLEMENT PO), Frozen Nutritional Treat with meals, Disp: , Rfl:  .  Nutritional Supplements (PROMOD) LIQD, Protein Liquid - give 30 ml by mouth two times daily, Disp: , Rfl:  .  ondansetron (ZOFRAN) 4 MG tablet, Take 4 mg by mouth every 6 (six) hours as needed for nausea or vomiting., Disp: , Rfl:  .  polyethylene glycol (MIRALAX / GLYCOLAX) packet, Take 17 g by mouth daily., Disp: , Rfl:  .  Probiotic Product (PROBIOTIC PO), Take 1 tablet by mouth daily., Disp: , Rfl:  .   tizanidine (ZANAFLEX) 2 MG capsule, Take 2 mg by mouth 3 (three) times daily., Disp: , Rfl:  .  UNABLE TO FIND, PROTEIN LIQUID- 30 ml 2 TIMES DAILY RELATED TO UNSPECIFIED BUTTOCK, Disp: , Rfl:  .  Vitamin D, Ergocalciferol, (DRISDOL) 50000 units CAPS capsule, Take 50,000 Units by mouth every 30 (thirty)  days., Disp: , Rfl:      Review of Systems  Constitutional: Negative for chills and fever.  HENT: Negative for congestion and sore throat.   Eyes: Negative for photophobia.  Respiratory: Negative for cough, shortness of breath and wheezing.   Cardiovascular: Negative for chest pain, palpitations and leg swelling.  Gastrointestinal: Negative for abdominal pain, blood in stool, constipation, diarrhea, nausea and vomiting.  Genitourinary: Negative for dysuria, flank pain and hematuria.  Musculoskeletal: Negative for back pain and myalgias.  Skin: Positive for wound. Negative for rash.  Neurological: Positive for tremors and weakness. Negative for dizziness and headaches.  Hematological: Does not bruise/bleed easily.  Psychiatric/Behavioral: Negative for decreased concentration, dysphoric mood, hallucinations and suicidal ideas. The patient is not nervous/anxious and is not hyperactive.        Objective:   Physical Exam  Constitutional: She is oriented to person, place, and time. She appears well-nourished. No distress.  HENT:  Head: Normocephalic and atraumatic.  Mouth/Throat: Oropharynx is clear and moist. No oropharyngeal exudate.  Eyes: Conjunctivae and EOM are normal. No scleral icterus.  Neck: Normal range of motion. Neck supple.  Cardiovascular: Normal rate, regular rhythm and normal heart sounds. Exam reveals no gallop and no friction rub.  No murmur heard. Pulmonary/Chest: Effort normal. No respiratory distress. She has no wheezes.  Abdominal: Soft. Bowel sounds are normal. She exhibits no distension.  Musculoskeletal: She exhibits no edema or tenderness.  Neurological: She is  alert and oriented to person, place, and time. Coordination abnormal.  Skin: Skin is warm and dry. No rash noted. She is not diaphoretic. There is erythema.  Psychiatric: She has a normal mood and affect. Her behavior is normal. Judgment and thought content normal.      Decubitus ulcers  Right buttocks  05/19/17: granulation tissue and no purulence. Bone is palpable though     Right buttocks 07/13/2017: Wound is smaller but there is still palpable bone      Left buttocks 05/19/17: Granulation at edges, Tissue over bone not as healthy appearing as I would like       Left buttocks July 13, 2017: some granulation tissue but not pleased with progress here       Sacral decubitus 05/19/17:   Granulation tissue, bone again palpable deep to this area. I did not examine this area today           Assessment & Plan:  Sacral and ischial osteomyelitis with persistent ulcers  She has completed her 8 week course  I have contacted Dr. Marla Roe but [place]  I will get MRI of the pelvis here and sed rate and inflammatory markers.  In the interim I may have to have her seen by Dr. Eusebio Friendly partne or surgery  She needs to be TURNED EVERY 2 hours and to have special mattress and expert wound care  MS: much improved.  I spent greater than 25 minutes with the patient including greater than 50% of time in face to face counsel of the patient regarding my concern about her chronic osteomyelitis and in and in coordination of her care with Plastic Surgery.

## 2017-07-13 NOTE — Telephone Encounter (Signed)
I called [employer] to see if Josem Kaufmann was require for Xeomin J0588 & CPT codes [phone] & [phone] and Charleston Ropes with [employer] stated Josem Kaufmann is [place] Ref # [id number].   I also checked Medicaid and it ran through like she had it so I faxed notes to [facility].

## 2017-07-14 LAB — BASIC METABOLIC PANEL WITHOUT GFR
BUN/Creatinine Ratio: 40 (calc) — ABNORMAL HIGH (ref 6–22)
BUN: 33 mg/dL — ABNORMAL HIGH (ref 7–25)
CO2: 20 mmol/L (ref 20–32)
Calcium: 7.7 mg/dL — ABNORMAL LOW (ref 8.6–10.2)
Chloride: 106 mmol/L (ref 98–110)
Creat: 0.83 mg/dL (ref 0.50–1.10)
GFR, Est African American: 102 mL/min/{1.73_m2}
GFR, Est Non African American: 88 mL/min/{1.73_m2}
Glucose, Bld: 103 mg/dL — ABNORMAL HIGH (ref 65–99)
Potassium: 5.8 mmol/L — ABNORMAL HIGH (ref 3.5–5.3)
Sodium: 143 mmol/L (ref 135–146)

## 2017-07-14 LAB — C-REACTIVE PROTEIN: CRP: 47.6 mg/L — AB (ref <8.0)

## 2017-07-14 LAB — SEDIMENTATION RATE

## 2017-07-18 NOTE — Telephone Encounter (Signed)
Medicaid does not require an auth because the patient has a medicare plan as primary coverage.

## 2017-07-18 NOTE — Progress Notes (Signed)
[id number] 

## 2017-07-19 ENCOUNTER — Telehealth: Payer: Self-pay | Admitting: *Deleted

## 2017-07-19 NOTE — Telephone Encounter (Signed)
-----   Message from Truman Hayward, MD sent at 07/14/2017  1:34 PM EDT ----- Pts K was 5.8 she should have stat BMP at SNF or an urgent care

## 2017-07-19 NOTE — Telephone Encounter (Signed)
Made several attempts to contact the patient to advise she needs repeat labs as her potassium was high and was unable to reach her or leave a message. The facility is under new management and the number has changed her number just rings so called her contact her aunt. Was told she does not know the number or new name of the place. Told her the patient needs repeat labs and she advised she will go there today and inform the patient and have her call us to set something up. She advised the facility does not do labs on site so she will have to come here. Advised fine just get her here as soon as possible. She advised they will call back later today. Gave my name and direct number.

## 2017-07-25 NOTE — Telephone Encounter (Signed)
Marylene Buerger RN from facility called to advise she got repeat potassium 07/19/17 and was 4.6.  They will do a repeat Sed Rate and CRP today and fax the results to [place].  She also wanted to advise that patient is still waiting on MRI. Let me know to contact the facility once scheduled at [phone] or herself at [phone] and that she can go anytime.   Advised will let [name] know and once it is scheduled we will let her know.

## 2017-07-25 NOTE — Telephone Encounter (Signed)
Thanks Darnelle Maffucci that is a relief re the K

## 2017-07-28 ENCOUNTER — Encounter: Payer: Self-pay | Admitting: Neurology

## 2017-07-28 ENCOUNTER — Telehealth: Payer: Self-pay | Admitting: Neurology

## 2017-07-28 ENCOUNTER — Ambulatory Visit (INDEPENDENT_AMBULATORY_CARE_PROVIDER_SITE_OTHER): Payer: Medicare Other | Admitting: Neurology

## 2017-07-28 VITALS — BP 103/68 | HR 92

## 2017-07-28 DIAGNOSIS — G35 Multiple sclerosis: Secondary | ICD-10-CM

## 2017-07-28 DIAGNOSIS — G8220 Paraplegia, unspecified: Secondary | ICD-10-CM

## 2017-07-28 MED ORDER — INCOBOTULINUMTOXINA 100 UNITS IM SOLR
500.0000 [IU] | INTRAMUSCULAR | Status: DC
Start: 2017-07-28 — End: 2017-10-26
  Administered 2017-07-28: 500 [IU] via INTRAMUSCULAR

## 2017-07-28 NOTE — Progress Notes (Signed)
PATIENT: Deanna Baldwin DOB: [birth year]  Chief Complaint  Patient presents with  . Spastic Paraplegia    Xeomin 100 units x 5 vials - office supply     HISTORICAL  Deanna Baldwin is a 42 years old right-handed female, she is currently a resident at [facility], she is accompanied by her physical therapist [name]  and staff, seen in refer by her primary care physician Dr.  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  She lived at home with her 86 years old son and her aunt was her primary caregiver before she moved to current nursing home in 2017, her aunt visit her occasionally, facility is in charge of her medical care.  She was diagnosed with relapsing remitting multiple sclerosis since age 70, she initially presented with blurry vision, moderate fatigue, difficulty walking, she had frequent flareups over the years, eventually become wheelchair-bound since 2011,  She was under the care of neurologist at [facility] Dr. [name], I was able to review Dr. [name] note from 2013 to most recent  in February 2017, she had allergic reaction to Tysarbri IV infusion, was treated with rituximab since early 2013, 1000 mg every 6 months, she tolerated very well, infusion was in December 2016, because of her social status change, she was not able to get it every 6 months. For a while she was taking Ampyra for gait abnormality, but has stopped it because it is not helping her  Based on last neurology examination in February 2017, she was not ambulatory, in a power wheelchair, significant bilateral lower extremity spasticity, only has trace movement of bilateral leg, upper extremity motor strengths were well-preserved.  MRI of the brain and spine in May 2015, chronic demyelinating lesions, no enhancing lesion.  She has multiple hospital admission recently, for frequent UTI, decubitus  ulcer, with progressive increased weakness, also increased spasticity of bilateral lower extremity, decreased mobility to moving the chair, even to hold her sit up position,  I was able to review MRI of the cervical spine with and without contrast in May 2015, multifocal patchy area of T2/FLAIR signal abnormality in the entire cervical cord, visualized brainstem and upper thoracic spine, no contrast enhancement.  MRI of the brain 2015, stable November and distribution of numerous hyper intense flair lesions throughout the subcortical, deep and periventricular white matter, involvement of entire corpus callosum, as well as midbrain and pons, right cerebellum.  MRI of the brain 2012, questionable enhancement,  UPDATE Jan 19 2017:  MRI of the brain with and without contrast in June 2018: 1.   Multiple infratentorial and hemispheric T2/FLAIR hyperintense foci consistent with chronic demyelinating plaque associated with multiple sclerosis. None of the foci appeared to be acute. 2.   Moderate cortical atrophy and corpus callosum atrophy with mild brainstem atrophy. 3.   There are no acute findings.  MRI of the cervical spine without contrast   1.   Multiple T2 hyperintense foci within the spinal cord as detailed above.   IV access was not obtainable for contrast. 2.   Mild disc bulges at C3-C4, C4-C5 and C5-C6 but do not lead to any nerve root compression or central canal narrowing.  Patient received Rituxan 1000mg  on 10/11/16 from [facility] Dr. [name] office,.  She received Orevus 300mg  on 11/10/16 from intra-fusion.  Patient stated [facility] home, wants to keep appointment with [place]  Laboratory evaluation on November 29 2016 from [facility], RDW was elevated at 17.4, CD19 less than 1%, 20, less than 1%, normal liver functional test  She is wheelchair-bound, no memory loss, significant visual difficulty, spastic paraplegia  UPDATE Mar 09 2017: She came in for her first electrical  stimulation guided xeomin injection for spastic paraplegia, we used 500 xeomin  UPDATE July 28 2017: She did receive her ocrelizumab infusion recently, she is here for repeat xeomin injection for spastic bilateral lower extremity,  REVIEW OF SYSTEMS: Full 14 system review of systems performed and notable only for as above   ALLERGIES: No Known Allergies  HOME MEDICATIONS: Current Outpatient Medications  Medication Sig Dispense Refill  . acetaminophen (TYLENOL) 325 MG tablet Take 2 tablets (650 mg total) by mouth every 6 (six) hours as needed for mild pain (or Fever >/= 101).    . Ascorbic Acid (VITAMIN C PO) Take by mouth.    . bisacodyl (DULCOLAX) 5 MG EC tablet Take 5 mg by mouth daily.    Marland Kitchen CALCIUM ALGINATE EX Apply to coccyx and right ischial daily    . calcium-vitamin D (OSCAL WITH D) 500-200 MG-UNIT tablet Take 2 tablets by mouth 4 (four) times daily.    . Cyanocobalamin (VITAMIN B-12 PO) Take 1 tablet by mouth daily.    Marland Kitchen ENSURE (ENSURE) Take 237 mLs by mouth. With meals. Vanilla    . famotidine (PEPCID) 20 MG tablet Take 20 mg by mouth every morning.    . ferrous sulfate 325 (65 FE) MG tablet Take 325 mg by mouth daily with breakfast.    . guaiFENesin (MUCINEX) 600 MG 12 hr tablet Take 600 mg by mouth 2 (two) times daily.    Marland Kitchen HYDROcodone-acetaminophen (NORCO/VICODIN) 5-325 MG tablet Take 1 tablet by mouth every 4 (four) hours as needed for moderate pain.    Marland Kitchen ibuprofen (ADVIL,MOTRIN) 200 MG tablet Take 200 mg by mouth every 12 (twelve) hours as needed.    Marland Kitchen levothyroxine (SYNTHROID) 50 MCG tablet Take 50 mcg by mouth daily before breakfast.    . magnesium oxide (MAG-OX) 400 (241.3 Mg) MG tablet Take 1 tablet (400 mg total) by mouth 2 (two) times daily. 28 tablet 0  . midodrine (PROAMATINE) 2.5 MG tablet Take 1 tablet (2.5 mg total) by mouth 3 (three) times daily with meals.    . Multiple Vitamin (MULTIVITAMIN) tablet Take 1 tablet by mouth daily.    . Nutritional Supplements  (NUTRITIONAL SUPPLEMENT PO) Frozen Nutritional Treat with meals    . Nutritional Supplements (PROMOD) LIQD Protein Liquid - give 30 ml by mouth two times daily    . ondansetron (ZOFRAN) 4 MG tablet Take 4 mg by mouth every 6 (six) hours as needed for nausea or vomiting.    . polyethylene glycol (MIRALAX / GLYCOLAX) packet Take 17 g by mouth daily.    . Probiotic Product (PROBIOTIC PO) Take 1 tablet by mouth daily.    . tizanidine (ZANAFLEX) 2 MG capsule Take 2 mg by mouth 3 (three) times daily.    Marland Kitchen UNABLE TO FIND PROTEIN LIQUID- 30 ml 2 TIMES DAILY RELATED TO UNSPECIFIED BUTTOCK    . Vitamin D, Ergocalciferol, (DRISDOL) 50000 units CAPS capsule Take 50,000 Units by mouth every 30 (thirty) days.     No current facility-administered medications for this visit.     PAST MEDICAL HISTORY: Past Medical History:  Diagnosis Date  . Buttock wound 03/03/2016  . Dysrhythmia    tachycardia  . MS (multiple sclerosis) ([place])   .  Neutropenia ([place])   . Pneumonia 02/2016  . Protein calorie malnutrition ([place])   . Sacral osteomyelitis ([place]) 05/19/2017  . Severe sepsis ([place]) 03/03/2016  . Spastic 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 ([place])   . UTI (urinary tract infection) 02/2016    PAST SURGICAL HISTORY: Past Surgical History:  Procedure Laterality Date  . DIVERTING ILEOSTOMY N/A 09/03/2016   Procedure: ILEAL CONDUIT  URINARY DIVERSION OPEN;  Surgeon: Ardis Hughs, MD;  Location: [facility];  Service: Urology;  Laterality: N/A;    FAMILY HISTORY: Family History  Problem Relation Age of Onset  . Mental illness Sister     SOCIAL HISTORY:  Social History   Socioeconomic History  . Marital status: Single    Spouse name: Not on file  . Number of children: Not on file  . Years of education: Not on file  . Highest education level: Not on file  Occupational History  . Occupation: Disabled  Social Needs  . Financial resource strain: Not on file  . Food insecurity:    Worry: Not on  file    Inability: Not on file  . Transportation needs:    Medical: Not on file    Non-medical: Not on file  Tobacco Use  . Smoking status: Former Smoker    Packs/day: 1.00    Years: 10.00    Pack years: 10.00    Types: Cigarettes    Last attempt to quit: 02/01/2010    Years since quitting: 7.4  . Smokeless tobacco: Never Used  Substance and Sexual Activity  . Alcohol use: No  . Drug use: No  . Sexual activity: Not on file  Lifestyle  . Physical activity:    Days per week: Not on file    Minutes per session: Not on file  . Stress: Not on file  Relationships  . Social connections:    Talks on phone: Not on file    Gets together: Not on file    Attends religious service: Not on file    Active member of club or organization: Not on file    Attends meetings of clubs or organizations: Not on file    Relationship status: Not on file  . Intimate partner violence:    Fear of current or ex partner: Not on file    Emotionally abused: Not on file    Physically abused: Not on file    Forced sexual activity: Not on file  Other Topics Concern  . Not on file  Social History Narrative   She is a resident at [facility].   Right-handed.     PHYSICAL EXAM   Vitals:   07/28/17 1527  BP: 103/68  Pulse: 92    Not recorded      There is no height or weight on file to calculate BMI.  PHYSICAL EXAMNIATION:  Gen: NAD, conversant, well nourised, obese, well groomed                     Cardiovascular: Regular rate rhythm, no peripheral edema, warm, nontender. Eyes: Conjunctivae clear without exudates or hemorrhage Neck: Supple, no carotid bruits. Pulmonary: Clear to auscultation bilaterally   NEUROLOGICAL EXAM:  MENTAL STATUS: Speech/Cognition:  Cachexia, She sees in wheelchair, cooperative on examination, visual acuity both eyes 20/200, rotatory horizontal nystagmus bilaterally   CRANIAL NERVES: CN II: Visual fields are full to confrontation. Pupils are round  equal and briskly reactive to light. CN III, IV, VI: extraocular movement are normal. No  ptosis. CN V: Facial sensation is intact to pinprick in all 3 divisions bilaterally. Corneal responses are intact.  CN VII: Face is symmetric with normal eye closure and smile. CN VIII: Hearing is normal to rubbing fingers CN IX, X: Palate elevates symmetrically. Phonation is normal. CN XI: Head turning and shoulder shrug are intact CN XII: Tongue is midline with normal movements and no atrophy.  MOTOR:  Free movement of bilateral upper extremity, significant spasticity of bilateral lower extremity, no antigravity movement, fixed contraction of bilateral knee, tendency for hip adduction, even with passive movement, there is significant limited range of motion, maximum right hip abduction is barely past midline, left side is about 30 degrees from midline fixed bilateral hip flexion  REFLEXES: Hyperreflexia  SENSORY: Intact to light touch, pinprick,  COORDINATION: No dysmetria of finger-to-nose    GAIT/STANCE: Deferred   DIAGNOSTIC DATA (LABS, IMAGING, TESTING) - I reviewed patient records, labs, notes, testing and imaging myself where available.   ASSESSMENT AND PLAN  Miu Chiong is a 42 y.o. female   Relapsing remitting multiple sclerosis  Allergic reaction to Yehuda Budd virus was positive with titer of 3.48 on Jun 24 2016  Her disease was able to stabilize by previous rituximab infusion, 1000 units every 6 months, last infusion [facility] at the [facility] in September 2018   Ocrevus infusion in October 2018  Laboratory evaluations  Resum Ocrevus infusion 6 months from her last infusion in October 2018  Spastic paraplegia  Electrical stimulation guided xeomin injection, Initial injection was on March 09, 2017, repeat injection on July 28, 2017    Right adductor longus 100 units  Right adductor magnus 100 units  Right iliopsoas 100 units   Left adductor longus 100 units  Left  iliopsoas 50 units  Left adductor magnus 50 units   Marcial Pacas, M.D. Ph.D.  [facility] [address] [place], [place] [address] Ph: [phone] Fax: [phone]  CC: [name], DO

## 2017-07-28 NOTE — Progress Notes (Signed)
**  Xeomin 100 units x 5 vials, NDC 0379-5583-16, Lot [id number], Exp 10/2019, office supply.//mck,rn**

## 2017-07-28 NOTE — Telephone Encounter (Signed)
Can you find the date of her ocrelizumab infusion.

## 2017-07-29 NOTE — Telephone Encounter (Signed)
Per [facility], she came in for her Ocrevus 600mg  infusion on 07/04/17.

## 2017-08-01 ENCOUNTER — Inpatient Hospital Stay (HOSPITAL_COMMUNITY)
Admission: EM | Admit: 2017-08-01 | Discharge: 2017-08-11 | DRG: 871 | Disposition: A | Discharge status: Skilled Nursing Facility | Payer: Medicare Other | Source: Skilled Nursing Facility | Attending: Family Medicine | Admitting: Family Medicine

## 2017-08-01 ENCOUNTER — Other Ambulatory Visit: Payer: Self-pay

## 2017-08-01 DIAGNOSIS — R0600 Dyspnea, unspecified: Secondary | ICD-10-CM

## 2017-08-01 DIAGNOSIS — E039 Hypothyroidism, unspecified: Secondary | ICD-10-CM | POA: Diagnosis present

## 2017-08-01 DIAGNOSIS — R4701 Aphasia: Secondary | ICD-10-CM | POA: Diagnosis present

## 2017-08-01 DIAGNOSIS — T17990A Other foreign object in respiratory tract, part unspecified in causing asphyxiation, initial encounter: Secondary | ICD-10-CM | POA: Diagnosis present

## 2017-08-01 DIAGNOSIS — G8220 Paraplegia, unspecified: Secondary | ICD-10-CM | POA: Diagnosis present

## 2017-08-01 DIAGNOSIS — D638 Anemia in other chronic diseases classified elsewhere: Secondary | ICD-10-CM | POA: Diagnosis present

## 2017-08-01 DIAGNOSIS — E43 Unspecified severe protein-calorie malnutrition: Secondary | ICD-10-CM | POA: Diagnosis present

## 2017-08-01 DIAGNOSIS — L89324 Pressure ulcer of left buttock, stage 4: Secondary | ICD-10-CM | POA: Diagnosis present

## 2017-08-01 DIAGNOSIS — G35 Multiple sclerosis: Secondary | ICD-10-CM | POA: Diagnosis present

## 2017-08-01 DIAGNOSIS — Z681 Body mass index (BMI) 19 or less, adult: Secondary | ICD-10-CM

## 2017-08-01 DIAGNOSIS — Z87891 Personal history of nicotine dependence: Secondary | ICD-10-CM

## 2017-08-01 DIAGNOSIS — Z960 Presence of urogenital implants: Secondary | ICD-10-CM | POA: Diagnosis present

## 2017-08-01 DIAGNOSIS — L89314 Pressure ulcer of right buttock, stage 4: Secondary | ICD-10-CM | POA: Diagnosis present

## 2017-08-01 DIAGNOSIS — J9811 Atelectasis: Secondary | ICD-10-CM | POA: Diagnosis present

## 2017-08-01 DIAGNOSIS — L89154 Pressure ulcer of sacral region, stage 4: Secondary | ICD-10-CM | POA: Diagnosis present

## 2017-08-01 DIAGNOSIS — R0682 Tachypnea, not elsewhere classified: Secondary | ICD-10-CM

## 2017-08-01 DIAGNOSIS — A419 Sepsis, unspecified organism: Principal | ICD-10-CM | POA: Diagnosis present

## 2017-08-01 DIAGNOSIS — R0602 Shortness of breath: Secondary | ICD-10-CM | POA: Diagnosis not present

## 2017-08-01 DIAGNOSIS — L8994 Pressure ulcer of unspecified site, stage 4: Secondary | ICD-10-CM | POA: Diagnosis present

## 2017-08-01 DIAGNOSIS — N319 Neuromuscular dysfunction of bladder, unspecified: Secondary | ICD-10-CM | POA: Diagnosis present

## 2017-08-01 DIAGNOSIS — I9589 Other hypotension: Secondary | ICD-10-CM | POA: Diagnosis present

## 2017-08-01 DIAGNOSIS — J189 Pneumonia, unspecified organism: Secondary | ICD-10-CM

## 2017-08-01 DIAGNOSIS — E870 Hyperosmolality and hypernatremia: Secondary | ICD-10-CM | POA: Diagnosis not present

## 2017-08-01 DIAGNOSIS — J9690 Respiratory failure, unspecified, unspecified whether with hypoxia or hypercapnia: Secondary | ICD-10-CM

## 2017-08-01 DIAGNOSIS — Z79890 Hormone replacement therapy (postmenopausal): Secondary | ICD-10-CM

## 2017-08-01 DIAGNOSIS — Y95 Nosocomial condition: Secondary | ICD-10-CM | POA: Diagnosis present

## 2017-08-01 DIAGNOSIS — J181 Lobar pneumonia, unspecified organism: Secondary | ICD-10-CM | POA: Diagnosis present

## 2017-08-01 DIAGNOSIS — E875 Hyperkalemia: Secondary | ICD-10-CM | POA: Diagnosis not present

## 2017-08-01 DIAGNOSIS — Z7401 Bed confinement status: Secondary | ICD-10-CM

## 2017-08-01 MED ORDER — SODIUM CHLORIDE 0.9 % IV BOLUS (SEPSIS)
500.0000 mL | Freq: Once | INTRAVENOUS | Status: AC
  Administered 2017-08-02: 500 mL via INTRAVENOUS

## 2017-08-01 MED ORDER — VANCOMYCIN HCL IN DEXTROSE 1-5 GM/200ML-% IV SOLN
1000.0000 mg | Freq: Once | INTRAVENOUS | Status: AC
  Administered 2017-08-02: 1000 mg via INTRAVENOUS
  Filled 2017-08-01: qty 200

## 2017-08-01 MED ORDER — SODIUM CHLORIDE 0.9 % IV SOLN
2.0000 g | Freq: Once | INTRAVENOUS | Status: AC
  Administered 2017-08-02: 2 g via INTRAVENOUS
  Filled 2017-08-01: qty 2

## 2017-08-01 MED ORDER — SODIUM CHLORIDE 0.9 % IV BOLUS (SEPSIS)
1000.0000 mL | Freq: Once | INTRAVENOUS | Status: AC
  Administered 2017-08-02: 1000 mL via INTRAVENOUS

## 2017-08-01 NOTE — ED Triage Notes (Signed)
Patient arrived from [place]; per EMS pt's sats were in the 80's, oral temp of 103.6 (1000mg  Tylenol given). Patient has hx of MS.

## 2017-08-01 NOTE — ED Provider Notes (Signed)
Emergency Department Provider Note   I have reviewed the triage vital signs and the nursing notes.   HISTORY  Chief Complaint Shortness of Breath   HPI Deanna Baldwin is a 42 y.o. female with PMH of MS, sacral osteomyelitis, malnutrition, and urostomy since to the emergency department for evaluation of fever, shortness of breath, hypoxemia.  According to EMS the patient's roommate was recently diagnosed with pneumonia.  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  Level 5 caveat applies.  EMS found the patient to be tachycardic in addition to hypoxemia.  She was given 1000 mg of Tylenol in route.  No IV fluids.   Level 5 caveat: MS and baseline aphasia.   Past Medical History:  Diagnosis Date  . Buttock wound 03/03/2016  . Dysrhythmia    tachycardia  . MS (multiple sclerosis) ([place])   . Neutropenia ([place])   . Pneumonia 02/2016  . Protein calorie malnutrition ([place])   . Sacral osteomyelitis ([place]) 05/19/2017  . Severe sepsis ([place]) 03/03/2016  . 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 UTI (urinary tract infection) 02/2016    Patient Active Problem List   Diagnosis Date Noted  . 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 Sacral osteomyelitis ([place]) 05/19/2017  . Hypothyroidism 04/09/2017  . Hypocalcemia 04/08/2017  . Anemia 04/08/2017  . Osteomyelitis ([place]) 03/19/2017  . Altered mental status   . Dehydration   . Acute osteomyelitis of left pelvic region and thigh ([place])   . SIRS (systemic inflammatory response syndrome) ([place]) 03/17/2017  . Paraplegia ([place]) 03/09/2017  . Chronic constipation 02/13/2017  . Elevated liver enzymes 02/13/2017  . Chronic pain 12/13/2016  . Leukocytosis 09/15/2016  . Hypotension 08/31/2016  . Vitamin B 12 deficiency 07/09/2016  . Pressure ulcer, stage 4 ([place]) 07/08/2016  . Sepsis ([place]) 07/07/2016  . Unintentional weight loss 07/06/2016  . Relapsing remitting multiple sclerosis ([place]) 06/08/2016  . 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 Tachycardia 04/08/2016  . Anemia of chronic disease 04/06/2016  . Dysphagia, oral phase 03/17/2016  . Palliative care by specialist   . Protein-calorie malnutrition, severe ([place])   . 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 UTI (urinary tract infection) 03/02/2016  . Elevated liver function tests 03/02/2016    Past Surgical History:  Procedure Laterality Date  . DIVERTING ILEOSTOMY N/A 09/03/2016   Procedure: ILEAL CONDUIT  URINARY DIVERSION OPEN;  Surgeon: Ardis Hughs, MD;  Location: [facility];  Service: Urology;  Laterality: N/A;    Allergies Patient has no known allergies.  Family History  Problem Relation Age of Onset  . Mental illness Sister     Social History Social History   Tobacco Use  . Smoking status: Former Smoker    Packs/day: 1.00    Years: 10.00    Pack years: 10.00    Types: Cigarettes    Last attempt to quit: 02/01/2010    Years since quitting: 7.5  . Smokeless tobacco: Never Used  Substance Use Topics  . Alcohol use: No  . Drug use: No    Review of Systems  Level 5 caveat: MS and aphasia.  ____________________________________________   PHYSICAL EXAM:  VITAL SIGNS: ED Triage Vitals  Enc Vitals Group     BP 08/01/17 2344 (!) 93/54     Pulse Rate 08/01/17 2344 (!) 149     Resp 08/01/17 2344 (!) 28     Temp 08/01/17 2344 (!) 103.2 F (39.6 C)  Temp Source 08/01/17 2344 Oral     SpO2 08/01/17 2344 (!) 87 %     Weight 08/01/17 2343 91 lb (41.3 kg)     Pain Score 08/01/17 2343 0   Constitutional: Alert and smiling. Confused speech at times. Unable to provide a significant history.  Eyes: Conjunctivae are normal. Head: Atraumatic. Nose: No congestion/rhinnorhea. Mouth/Throat: Mucous membranes are dry.  Neck: No stridor.  Cardiovascular: Sinus tachycardia. Good peripheral circulation. Grossly normal heart sounds.   Respiratory: Increased respiratory effort.  No retractions. Lungs  CTAB. Gastrointestinal: Soft and nontender. No distention.  Musculoskeletal: Spasticity in upper and lower extremities.  Neurologic:  Baseline aphasia. Spastic paresis.  Skin:  Skin is warm and dry.   ____________________________________________   LABS (all labs ordered are listed, but only abnormal results are displayed)  Labs Reviewed  COMPREHENSIVE METABOLIC PANEL - Abnormal; Notable for the following components:      Result Value   Potassium 6.2 (*)    Glucose, Bld 137 (*)    BUN 45 (*)    Creatinine, Ser 1.87 (*)    Calcium 7.3 (*)    Albumin 3.2 (*)    AST 174 (*)    ALT 87 (*)    Alkaline Phosphatase 627 (*)    Total Bilirubin 1.8 (*)    GFR calc non Af Amer 32 (*)    GFR calc Af Amer 38 (*)    Anion gap 16 (*)    All other components within normal limits  CBC WITH DIFFERENTIAL/PLATELET - Abnormal; Notable for the following components:   WBC 12.5 (*)    RBC 2.55 (*)    Hemoglobin 8.0 (*)    HCT 26.4 (*)    MCV 103.5 (*)    Platelets 487 (*)    Neutro Abs 10.6 (*)    All other components within normal limits  URINALYSIS, ROUTINE W REFLEX MICROSCOPIC - Abnormal; Notable for the following components:   Color, Urine AMBER (*)    APPearance CLOUDY (*)    pH 9.0 (*)    Protein, ur 100 (*)    Leukocytes, UA LARGE (*)    Bacteria, UA RARE (*)    All other components within normal limits  PROTIME-INR - Abnormal; Notable for the following components:   Prothrombin Time 15.3 (*)    All other components within normal limits  I-STAT CG4 LACTIC ACID, ED - Abnormal; Notable for the following components:   Lactic Acid, Venous 3.34 (*)    All other components within normal limits  I-STAT CG4 LACTIC ACID, ED - Abnormal; Notable for the following components:   Lactic Acid, Venous 2.47 (*)    All other components within normal limits  CULTURE, BLOOD (ROUTINE X 2)  CULTURE, BLOOD (ROUTINE X 2)  URINE CULTURE   ____________________________________________  EKG   EKG  Interpretation  Date/Time:  Monday August 01 2017 23:49:37 EDT Ventricular Rate:  145 PR Interval:    QRS Duration: 89 QT Interval:  272 QTC Calculation: 423 R Axis:   -13 Text Interpretation:  Sinus tachycardia Probable left atrial enlargement Borderline repolarization abnormality Baseline wander in lead(s) I II aVR aVF V1 V2 V3 V4 V5 No STEMI.  Confirmed by Nanda Quinton [phone]) on 08/01/2017 11:56:38 PM       ____________________________________________  RADIOLOGY  Dg Chest 2 View  Result Date: 08/02/2017 CLINICAL DATA:  41 year old female with fever. EXAM: CHEST - 2 VIEW COMPARISON:  Chest radiograph dated 03/17/2017 FINDINGS: There is a large left  pleural effusion with consolidative changes of the majority of the left lung. Only a small aerated portion of the long noted in the left upper lobe. There is associated volume loss and shift of the mediastinum into the left hemithorax. The right lung is clear. No pneumothorax. No acute osseous pathology. IMPRESSION: Large left pleural effusion with opacification of the left hemithorax as described. CT may provide better evaluation of the size of the pleural effusion and assessment if thoracentesis is clinically considered. Electronically Signed   By: Anner Crete M.D.   On: 08/02/2017 01:23   Ct Chest W Contrast  Result Date: 08/02/2017 CLINICAL DATA:  Recent pneumonia, cough EXAM: CT CHEST WITH CONTRAST TECHNIQUE: Multidetector CT imaging of the chest was performed during intravenous contrast administration. CONTRAST:  12mL OMNIPAQUE IOHEXOL 300 MG/ML  SOLN COMPARISON:  08/02/2017 radiograph, CT chest 08/31/2016 FINDINGS: Cardiovascular: Nonaneurysmal aorta. Normal heart size. No significant pericardial effusion. Mediastinum/Nodes: Midline trachea. No thyroid mass. Borderline mediastinal lymph node, left paratracheal lymph node measures 9 mm. Lungs/Pleura: Small left-sided pleural effusion. Extensive consolidation within the left lower lobe with  partial consolidation in the left upper lobe and ground-glass density. No pneumothorax. Similar appearance of volume loss on the left. Upper Abdomen: 5 mm probable stone in the mid right kidney. Musculoskeletal: No chest wall abnormality. No acute or significant osseous findings. IMPRESSION: 1. Small left-sided pleural effusion. Extensive left lower lobe and left upper lobe consolidative process, most consistent with a pneumonia. 2. Nonobstructing stone within the right kidney. Electronically Signed   By: Donavan Foil M.D.   On: 08/02/2017 02:42    ____________________________________________   PROCEDURES  Procedure(s) performed:   .Critical Care Performed by: Margette Fast, MD Authorized by: Margette Fast, MD   Critical care provider statement:    Critical care time (minutes):  35   Critical care time was exclusive of:  Separately billable procedures and treating other patients and teaching time   Critical care was necessary to treat or prevent imminent or life-threatening deterioration of the following conditions:  Sepsis and respiratory failure   Critical care was time spent personally by me on the following activities:  Blood draw for specimens, development of treatment plan with patient or surrogate, evaluation of patient's response to treatment, examination of patient, obtaining history from patient or surrogate, ordering and performing treatments and interventions, ordering and review of laboratory studies, ordering and review of radiographic studies, pulse oximetry, re-evaluation of patient's condition and review of old charts   I assumed direction of critical care for this patient from another provider in my specialty: no      ____________________________________________   INITIAL IMPRESSION / [place] / ED COURSE  Pertinent labs & imaging results that were available during my care of the patient were reviewed by me and considered in my medical decision making (see  chart for details).  Patient presents to the emergency department from a nursing facility with concern for pneumonia and sepsis.  Patient is febrile, tachycardic, tachypneic.  Presumed respiratory source.  Patient has a urostomy with UA that will likely appear dirty. Code sepsis activated. Patient with BP that is slightly low but near baseline. Of note, the patient is followed by ID 2/2 sacral osteomyelitis treated empirically with Vanc, Ceftriaxone, and Flagyy. Completed 8 week course on 07/13/17. MRI pelvis ordered as outpatient but no result in Epic.   Labs with elevated lactate and leukocytosis. CXR with opacity concerning for PNA/pleural effusion. CT obtained which confirms PNA. Treated  for HCAP from the start. IVF in (30mg /kg). Patient with baseline low BP on mididrine for this. Not requiring pressors.   Discussed patient's case with Hospitalist, Dr. Hal Hope to request admission. Patient and family (if present) updated with plan. Care transferred to Hospitalist service.  I reviewed all nursing notes, vitals, pertinent old records, EKGs, labs, imaging (as available).  ____________________________________________  FINAL CLINICAL IMPRESSION(S) / ED DIAGNOSES  Final diagnoses:  HCAP (healthcare-associated pneumonia)  Sepsis, due to unspecified organism ([place])     MEDICATIONS GIVEN DURING THIS VISIT:  Medications  ceFEPIme (MAXIPIME) 1 g in sodium chloride 0.9 % 100 mL IVPB (has no administration in time range)  vancomycin (VANCOCIN) IVPB 1000 mg/200 mL premix (has no administration in time range)  sodium chloride 0.9 % bolus 1,000 mL (0 mLs Intravenous Stopped 08/02/17 0136)    And  sodium chloride 0.9 % bolus 500 mL (0 mLs Intravenous Stopped 08/02/17 0239)  ceFEPIme (MAXIPIME) 2 g in sodium chloride 0.9 % 100 mL IVPB (0 g Intravenous Stopped 08/02/17 0136)  vancomycin (VANCOCIN) IVPB 1000 mg/200 mL premix (0 mg Intravenous Stopped 08/02/17 0206)  iohexol (OMNIPAQUE) 300 MG/ML solution 100  mL (30 mLs Intravenous Contrast Given 08/02/17 0145)    Note:  This document was prepared using Dragon voice recognition software and may include unintentional dictation errors.  Nanda Quinton, MD Emergency Medicine     Long, [facility], MD 08/02/17 [phone]

## 2017-08-02 ENCOUNTER — Encounter (HOSPITAL_COMMUNITY): Payer: Self-pay | Admitting: Internal Medicine

## 2017-08-02 ENCOUNTER — Other Ambulatory Visit: Payer: Self-pay

## 2017-08-02 ENCOUNTER — Emergency Department (HOSPITAL_COMMUNITY): Payer: Medicare Other

## 2017-08-02 DIAGNOSIS — R4701 Aphasia: Secondary | ICD-10-CM | POA: Diagnosis present

## 2017-08-02 DIAGNOSIS — L89104 Pressure ulcer of unspecified part of back, stage 4: Secondary | ICD-10-CM | POA: Diagnosis not present

## 2017-08-02 DIAGNOSIS — Z87891 Personal history of nicotine dependence: Secondary | ICD-10-CM | POA: Diagnosis not present

## 2017-08-02 DIAGNOSIS — E870 Hyperosmolality and hypernatremia: Secondary | ICD-10-CM | POA: Diagnosis not present

## 2017-08-02 DIAGNOSIS — R0602 Shortness of breath: Secondary | ICD-10-CM | POA: Diagnosis present

## 2017-08-02 DIAGNOSIS — L89004 Pressure ulcer of unspecified elbow, stage 4: Secondary | ICD-10-CM | POA: Diagnosis not present

## 2017-08-02 DIAGNOSIS — L89314 Pressure ulcer of right buttock, stage 4: Secondary | ICD-10-CM | POA: Diagnosis present

## 2017-08-02 DIAGNOSIS — Z7401 Bed confinement status: Secondary | ICD-10-CM | POA: Diagnosis not present

## 2017-08-02 DIAGNOSIS — Z681 Body mass index (BMI) 19 or less, adult: Secondary | ICD-10-CM | POA: Diagnosis not present

## 2017-08-02 DIAGNOSIS — J181 Lobar pneumonia, unspecified organism: Secondary | ICD-10-CM | POA: Diagnosis present

## 2017-08-02 DIAGNOSIS — J189 Pneumonia, unspecified organism: Secondary | ICD-10-CM | POA: Diagnosis not present

## 2017-08-02 DIAGNOSIS — E039 Hypothyroidism, unspecified: Secondary | ICD-10-CM | POA: Diagnosis present

## 2017-08-02 DIAGNOSIS — D638 Anemia in other chronic diseases classified elsewhere: Secondary | ICD-10-CM | POA: Diagnosis present

## 2017-08-02 DIAGNOSIS — T17990A Other foreign object in respiratory tract, part unspecified in causing asphyxiation, initial encounter: Secondary | ICD-10-CM | POA: Diagnosis present

## 2017-08-02 DIAGNOSIS — Y95 Nosocomial condition: Secondary | ICD-10-CM | POA: Diagnosis present

## 2017-08-02 DIAGNOSIS — E875 Hyperkalemia: Secondary | ICD-10-CM | POA: Diagnosis not present

## 2017-08-02 DIAGNOSIS — Z79890 Hormone replacement therapy (postmenopausal): Secondary | ICD-10-CM | POA: Diagnosis not present

## 2017-08-02 DIAGNOSIS — G8220 Paraplegia, unspecified: Secondary | ICD-10-CM | POA: Diagnosis present

## 2017-08-02 DIAGNOSIS — L89324 Pressure ulcer of left buttock, stage 4: Secondary | ICD-10-CM | POA: Diagnosis present

## 2017-08-02 DIAGNOSIS — A419 Sepsis, unspecified organism: Secondary | ICD-10-CM | POA: Diagnosis present

## 2017-08-02 DIAGNOSIS — E43 Unspecified severe protein-calorie malnutrition: Secondary | ICD-10-CM | POA: Diagnosis present

## 2017-08-02 DIAGNOSIS — G35 Multiple sclerosis: Secondary | ICD-10-CM | POA: Diagnosis present

## 2017-08-02 DIAGNOSIS — L89154 Pressure ulcer of sacral region, stage 4: Secondary | ICD-10-CM | POA: Diagnosis present

## 2017-08-02 DIAGNOSIS — J9811 Atelectasis: Secondary | ICD-10-CM | POA: Diagnosis not present

## 2017-08-02 DIAGNOSIS — N319 Neuromuscular dysfunction of bladder, unspecified: Secondary | ICD-10-CM | POA: Diagnosis present

## 2017-08-02 DIAGNOSIS — I9589 Other hypotension: Secondary | ICD-10-CM | POA: Diagnosis present

## 2017-08-02 DIAGNOSIS — Z960 Presence of urogenital implants: Secondary | ICD-10-CM | POA: Diagnosis present

## 2017-08-02 LAB — CBC WITH DIFFERENTIAL/PLATELET
Abs Immature Granulocytes: 0.1 10*3/uL (ref 0.0–0.1)
BASOS PCT: 0 %
Basophils Absolute: 0 10*3/uL (ref 0.0–0.1)
Eosinophils Absolute: 0.1 10*3/uL (ref 0.0–0.7)
Eosinophils Relative: 1 %
HCT: 26.4 % — ABNORMAL LOW (ref 36.0–46.0)
Hemoglobin: 8 g/dL — ABNORMAL LOW (ref 12.0–15.0)
Immature Granulocytes: 1 %
Lymphocytes Relative: 7 %
Lymphs Abs: 0.8 10*3/uL (ref 0.7–4.0)
MCH: 31.4 pg (ref 26.0–34.0)
MCHC: 30.3 g/dL (ref 30.0–36.0)
MCV: 103.5 fL — AB (ref 78.0–100.0)
MONO ABS: 0.8 10*3/uL (ref 0.1–1.0)
MONOS PCT: 6 %
NEUTROS PCT: 85 %
Neutro Abs: 10.6 10*3/uL — ABNORMAL HIGH (ref 1.7–7.7)
Platelets: 487 10*3/uL — ABNORMAL HIGH (ref 150–400)
RBC: 2.55 MIL/uL — ABNORMAL LOW (ref 3.87–5.11)
RDW: 12 % (ref 11.5–15.5)
WBC: 12.5 10*3/uL — ABNORMAL HIGH (ref 4.0–10.5)

## 2017-08-02 LAB — STREP PNEUMONIAE URINARY ANTIGEN: STREP PNEUMO URINARY ANTIGEN: NEGATIVE

## 2017-08-02 LAB — BASIC METABOLIC PANEL
Anion gap: 13 (ref 5–15)
BUN: 36 mg/dL — ABNORMAL HIGH (ref 6–20)
CHLORIDE: 109 mmol/L (ref 98–111)
CO2: 23 mmol/L (ref 22–32)
Calcium: 7.1 mg/dL — ABNORMAL LOW (ref 8.9–10.3)
Creatinine, Ser: 1.39 mg/dL — ABNORMAL HIGH (ref 0.44–1.00)
GFR calc non Af Amer: 46 mL/min — ABNORMAL LOW (ref >60)
GFR, EST AFRICAN AMERICAN: 54 mL/min — AB (ref >60)
Glucose, Bld: 136 mg/dL — ABNORMAL HIGH (ref 70–99)
Potassium: 4.5 mmol/L (ref 3.5–5.1)
Sodium: 145 mmol/L (ref 135–145)

## 2017-08-02 LAB — COMPREHENSIVE METABOLIC PANEL
ALT: 87 U/L — ABNORMAL HIGH (ref 0–44)
AST: 174 U/L — AB (ref 15–41)
Albumin: 3.2 g/dL — ABNORMAL LOW (ref 3.5–5.0)
Alkaline Phosphatase: 627 U/L — ABNORMAL HIGH (ref 38–126)
Anion gap: 16 — ABNORMAL HIGH (ref 5–15)
BILIRUBIN TOTAL: 1.8 mg/dL — AB (ref 0.3–1.2)
BUN: 45 mg/dL — AB (ref 6–20)
CO2: 22 mmol/L (ref 22–32)
CREATININE: 1.87 mg/dL — AB (ref 0.44–1.00)
Calcium: 7.3 mg/dL — ABNORMAL LOW (ref 8.9–10.3)
Chloride: 103 mmol/L (ref 98–111)
GFR calc non Af Amer: 32 mL/min — ABNORMAL LOW (ref >60)
GFR, EST AFRICAN AMERICAN: 38 mL/min — AB (ref >60)
Glucose, Bld: 137 mg/dL — ABNORMAL HIGH (ref 70–99)
POTASSIUM: 6.2 mmol/L — AB (ref 3.5–5.1)
Sodium: 141 mmol/L (ref 135–145)
Total Protein: 7.1 g/dL (ref 6.5–8.1)

## 2017-08-02 LAB — PROTIME-INR
INR: 1.21
INR: 1.35
PROTHROMBIN TIME: 15.3 s — AB (ref 11.4–15.2)
PROTHROMBIN TIME: 16.5 s — AB (ref 11.4–15.2)

## 2017-08-02 LAB — I-STAT CG4 LACTIC ACID, ED
LACTIC ACID, VENOUS: 3.34 mmol/L — AB (ref 0.5–1.9)
Lactic Acid, Venous: 2.47 mmol/L (ref 0.5–1.9)

## 2017-08-02 LAB — CBC
HCT: 23.3 % — ABNORMAL LOW (ref 36.0–46.0)
Hemoglobin: 7.1 g/dL — ABNORMAL LOW (ref 12.0–15.0)
MCH: 32.4 pg (ref 26.0–34.0)
MCHC: 30.5 g/dL (ref 30.0–36.0)
MCV: 106.4 fL — ABNORMAL HIGH (ref 78.0–100.0)
Platelets: 422 10*3/uL — ABNORMAL HIGH (ref 150–400)
RBC: 2.19 MIL/uL — ABNORMAL LOW (ref 3.87–5.11)
RDW: 12.2 % (ref 11.5–15.5)
WBC: 11 10*3/uL — ABNORMAL HIGH (ref 4.0–10.5)

## 2017-08-02 LAB — URINALYSIS, ROUTINE W REFLEX MICROSCOPIC
BILIRUBIN URINE: NEGATIVE
Glucose, UA: NEGATIVE mg/dL
Hgb urine dipstick: NEGATIVE
Ketones, ur: NEGATIVE mg/dL
NITRITE: NEGATIVE
Protein, ur: 100 mg/dL — AB
SPECIFIC GRAVITY, URINE: 1.013 (ref 1.005–1.030)
pH: 9 — ABNORMAL HIGH (ref 5.0–8.0)

## 2017-08-02 LAB — LACTIC ACID, PLASMA
LACTIC ACID, VENOUS: 2.6 mmol/L — AB (ref 0.5–1.9)
Lactic Acid, Venous: 3.7 mmol/L (ref 0.5–1.9)
Lactic Acid, Venous: 2.5 mmol/L (ref 0.5–1.9)

## 2017-08-02 LAB — PROCALCITONIN: Procalcitonin: 0.92 ng/mL

## 2017-08-02 LAB — MRSA PCR SCREENING: MRSA by PCR: POSITIVE — AB

## 2017-08-02 LAB — CREATININE, SERUM
CREATININE: 1.49 mg/dL — AB (ref 0.44–1.00)
GFR calc Af Amer: 49 mL/min — ABNORMAL LOW (ref >60)
GFR calc non Af Amer: 43 mL/min — ABNORMAL LOW (ref >60)

## 2017-08-02 LAB — APTT: APTT: 32 s (ref 24–36)

## 2017-08-02 MED ORDER — ENOXAPARIN SODIUM 300 MG/3ML IJ SOLN
20.0000 mg | INTRAMUSCULAR | Status: DC
  Administered 2017-08-02: 20 mg via SUBCUTANEOUS
  Filled 2017-08-02 (×2): qty 0.2

## 2017-08-02 MED ORDER — HYDROCODONE-ACETAMINOPHEN 5-325 MG PO TABS
1.0000 | ORAL_TABLET | ORAL | Status: DC | PRN
Start: 2017-08-02 — End: 2017-08-11
  Administered 2017-08-10 (×2): 1 via ORAL
  Filled 2017-08-02 (×2): qty 1

## 2017-08-02 MED ORDER — ENOXAPARIN SODIUM 300 MG/3ML IJ SOLN: 20.0000 mg | INTRAMUSCULAR | Status: DC

## 2017-08-02 MED ORDER — POLYETHYLENE GLYCOL 3350 17 G PO PACK
17.0000 g | PACK | Freq: Every day | ORAL | Status: DC
  Administered 2017-08-02: 17 g via ORAL
  Filled 2017-08-02 (×4): qty 1

## 2017-08-02 MED ORDER — VITAMIN B-12 100 MCG PO TABS
100.0000 ug | ORAL_TABLET | Freq: Every day | ORAL | Status: DC
  Administered 2017-08-03 – 2017-08-11 (×9): 100 ug via ORAL
  Filled 2017-08-02 (×10): qty 1

## 2017-08-02 MED ORDER — VITAMIN C 500 MG PO TABS
500.0000 mg | ORAL_TABLET | Freq: Every day | ORAL | Status: DC
  Administered 2017-08-02 – 2017-08-11 (×10): 500 mg via ORAL
  Filled 2017-08-02 (×10): qty 1

## 2017-08-02 MED ORDER — FAMOTIDINE 20 MG PO TABS
20.0000 mg | ORAL_TABLET | Freq: Every day | ORAL | Status: DC
  Administered 2017-08-02 – 2017-08-11 (×10): 20 mg via ORAL
  Filled 2017-08-02 (×10): qty 1

## 2017-08-02 MED ORDER — ACETAMINOPHEN 325 MG PO TABS
650.0000 mg | ORAL_TABLET | Freq: Four times a day (QID) | ORAL | Status: DC | PRN
  Administered 2017-08-02 – 2017-08-11 (×3): 650 mg via ORAL
  Filled 2017-08-02 (×5): qty 2

## 2017-08-02 MED ORDER — ONDANSETRON HCL 4 MG PO TABS: 4.0000 mg | ORAL_TABLET | Freq: Four times a day (QID) | ORAL | Status: DC | PRN

## 2017-08-02 MED ORDER — ACETAMINOPHEN 650 MG RE SUPP: 650.0000 mg | Freq: Four times a day (QID) | RECTAL | Status: DC | PRN

## 2017-08-02 MED ORDER — PIPERACILLIN-TAZOBACTAM 3.375 G IVPB
3.3750 g | Freq: Three times a day (TID) | INTRAVENOUS | Status: DC
  Administered 2017-08-02 (×2): 3.375 g via INTRAVENOUS
  Filled 2017-08-02 (×2): qty 50

## 2017-08-02 MED ORDER — GUAIFENESIN ER 600 MG PO TB12
600.0000 mg | ORAL_TABLET | Freq: Two times a day (BID) | ORAL | Status: DC
  Administered 2017-08-02 – 2017-08-11 (×19): 600 mg via ORAL
  Filled 2017-08-02 (×19): qty 1

## 2017-08-02 MED ORDER — FERROUS SULFATE 325 (65 FE) MG PO TABS
325.0000 mg | ORAL_TABLET | Freq: Every day | ORAL | Status: DC
  Administered 2017-08-02 – 2017-08-11 (×10): 325 mg via ORAL
  Filled 2017-08-02 (×11): qty 1

## 2017-08-02 MED ORDER — MAGNESIUM OXIDE 400 (241.3 MG) MG PO TABS
400.0000 mg | ORAL_TABLET | Freq: Two times a day (BID) | ORAL | Status: DC
  Administered 2017-08-02 – 2017-08-11 (×19): 400 mg via ORAL
  Filled 2017-08-02 (×19): qty 1

## 2017-08-02 MED ORDER — ADULT MULTIVITAMIN W/MINERALS CH
1.0000 | ORAL_TABLET | Freq: Every day | ORAL | Status: DC
  Administered 2017-08-02 – 2017-08-11 (×10): 1 via ORAL
  Filled 2017-08-02 (×10): qty 1

## 2017-08-02 MED ORDER — MIDODRINE HCL 5 MG PO TABS
2.5000 mg | ORAL_TABLET | Freq: Three times a day (TID) | ORAL | Status: DC
  Administered 2017-08-02 – 2017-08-11 (×29): 2.5 mg via ORAL
  Filled 2017-08-02 (×31): qty 1

## 2017-08-02 MED ORDER — BISACODYL 5 MG PO TBEC
5.0000 mg | DELAYED_RELEASE_TABLET | Freq: Every day | ORAL | Status: DC
  Administered 2017-08-02 – 2017-08-09 (×3): 5 mg via ORAL
  Filled 2017-08-02 (×7): qty 1

## 2017-08-02 MED ORDER — CALCIUM CARBONATE-VITAMIN D 500-200 MG-UNIT PO TABS
2.0000 | ORAL_TABLET | Freq: Four times a day (QID) | ORAL | Status: DC
  Administered 2017-08-02 – 2017-08-11 (×37): 2 via ORAL
  Filled 2017-08-02 (×43): qty 2

## 2017-08-02 MED ORDER — IOHEXOL 300 MG/ML  SOLN
100.0000 mL | Freq: Once | INTRAMUSCULAR | Status: AC
  Administered 2017-08-02: 30 mL via INTRAVENOUS

## 2017-08-02 MED ORDER — LEVOTHYROXINE SODIUM 50 MCG PO TABS
50.0000 ug | ORAL_TABLET | Freq: Every day | ORAL | Status: DC
  Administered 2017-08-02 – 2017-08-11 (×10): 50 ug via ORAL
  Filled 2017-08-02 (×11): qty 1

## 2017-08-02 MED ORDER — TIZANIDINE HCL 2 MG PO TABS
2.0000 mg | ORAL_TABLET | Freq: Three times a day (TID) | ORAL | Status: DC
  Administered 2017-08-02 – 2017-08-11 (×28): 2 mg via ORAL
  Filled 2017-08-02 (×30): qty 1

## 2017-08-02 MED ORDER — BOOST PO LIQD
237.0000 mL | Freq: Three times a day (TID) | ORAL | Status: DC
  Administered 2017-08-02: 237 mL via ORAL
  Filled 2017-08-02 (×4): qty 237

## 2017-08-02 MED ORDER — ENSURE ENLIVE PO LIQD
237.0000 mL | Freq: Three times a day (TID) | ORAL | Status: DC
Start: 2017-08-02 — End: 2017-08-11
  Administered 2017-08-02 – 2017-08-11 (×18): 237 mL via ORAL

## 2017-08-02 MED ORDER — SODIUM CHLORIDE 0.9 % IV SOLN
1.0000 g | INTRAVENOUS | Status: DC
  Administered 2017-08-02 – 2017-08-07 (×6): 1 g via INTRAVENOUS
  Filled 2017-08-02 (×6): qty 1

## 2017-08-02 MED ORDER — ONDANSETRON HCL 4 MG/2ML IJ SOLN: 4.0000 mg | Freq: Four times a day (QID) | INTRAMUSCULAR | Status: DC | PRN

## 2017-08-02 MED ORDER — SODIUM CHLORIDE 0.9 % IV BOLUS
1000.0000 mL | Freq: Once | INTRAVENOUS | Status: AC
  Administered 2017-08-02: 1000 mL via INTRAVENOUS

## 2017-08-02 MED ORDER — SODIUM CHLORIDE 0.9 % IV SOLN
INTRAVENOUS | Status: AC
  Administered 2017-08-02 (×2): via INTRAVENOUS

## 2017-08-02 MED ORDER — ENOXAPARIN SODIUM 30 MG/0.3ML ~~LOC~~ SOLN
20.0000 mg | SUBCUTANEOUS | Status: DC
Start: 2017-08-02 — End: 2017-08-11
  Administered 2017-08-02 – 2017-08-10 (×9): 20 mg via SUBCUTANEOUS
  Filled 2017-08-02 (×5): qty 0.2
  Filled 2017-08-02: qty 0.3
  Filled 2017-08-02 (×2): qty 0.2
  Filled 2017-08-02 (×4): qty 0.3
  Filled 2017-08-02 (×2): qty 0.2
  Filled 2017-08-02 (×2): qty 0.3
  Filled 2017-08-02: qty 0.2
  Filled 2017-08-02: qty 0.3

## 2017-08-02 MED ORDER — SODIUM CHLORIDE 0.9 % IV SOLN
1.0000 g | Freq: Three times a day (TID) | INTRAVENOUS | Status: DC
  Filled 2017-08-02 (×2): qty 1

## 2017-08-02 MED ORDER — VANCOMYCIN HCL IN DEXTROSE 1-5 GM/200ML-% IV SOLN
1000.0000 mg | INTRAVENOUS | Status: DC
  Administered 2017-08-02 – 2017-08-06 (×4): 1000 mg via INTRAVENOUS
  Filled 2017-08-02 (×5): qty 200

## 2017-08-02 NOTE — ED Notes (Signed)
Provider notified of HR 140s.

## 2017-08-02 NOTE — ED Notes (Signed)
Pt awake eating lunch speech therapy at bedside.

## 2017-08-02 NOTE — ED Notes (Addendum)
This RN awaiting a swallow screen by speech before administering PO medications.

## 2017-08-02 NOTE — Progress Notes (Signed)
Pharmacy Antibiotic Note  Deanna Baldwin is a 42 y.o. female admitted on 08/01/2017 with fever and shortness of breath.  Pharmacy has been consulted for Cefepime dosing for pneumonia. Zosyn discontinued.  Received cefepime 2 g IV @ 00:12 this AM 08/02/17 Patient weighs 41.3 kg, less than IBW  Plan: Cefepime 1 g q24h  Monitor clinical status, renal function, culture results.   Patient Measurements: Height: 5\' 9"  (175.3 cm)(taken from 07/07/17) Weight: 91 lb (41.3 kg) IBW/kg (Calculated) : 66.2kg  Weight: 91 lb (41.3 kg)  Temp (24hrs), Avg:102 F (38.9 C), Min:99.8 F (37.7 C), Max:103.2 F (39.6 C)  Recent Labs  Lab 08/01/17 2347 08/02/17 0005 08/02/17 0243 08/02/17 0652 08/02/17 0731 08/02/17 0804 08/02/17 1519  WBC 12.5*  --   --  11.0*  --   --   --   CREATININE 1.87*  --   --  1.49* 1.39*  --   --   LATICACIDVEN  --  3.34* 2.47* 3.7*  --  2.5* 2.6*    Estimated Creatinine Clearance: 34.7 mL/min (A) (by C-G formula based on SCr of 1.39 mg/dL (H)).    No Known Allergies   Microbiology: 7/2 MRSA PCR: positive 7/2 Urine Cx: sent 7/2 Blood Cx: sent  Thank you for allowing pharmacy to be a part of this patient's care. [name], RPh Clinical Pharmacist Please check AMION for all [place] numbers After 10pm call main pharmacy [phone] 08/02/2017 5:35 PM

## 2017-08-02 NOTE — Progress Notes (Signed)
Pharmacy Antibiotic Note  Deanna Baldwin is a 42 y.o. female admitted on 08/01/2017 with pneumonia.  Pharmacy had been consulted for vancomycin and cefepime dosing initially but now changing cefepime to zosyn. Tmax is 103.2 and WBC is elevated at 11. Scr is above baseline at 1.49.   Plan: Zosyn 3.375gm IV Q8H (4 hr inf) Continue vancomycin 1gm IV q24 hours F/u renal function, cultures and clinical course  Weight: 91 lb (41.3 kg)  Temp (24hrs), Avg:101.5 F (38.6 C), Min:99.8 F (37.7 C), Max:103.2 F (39.6 C)  Recent Labs  Lab 08/01/17 2347 08/02/17 0005 08/02/17 0243 08/02/17 0652  WBC 12.5*  --   --  11.0*  CREATININE 1.87*  --   --  1.49*  LATICACIDVEN  --  3.34* 2.47* 3.7*    Estimated Creatinine Clearance: 32.4 mL/min (A) (by C-G formula based on SCr of 1.49 mg/dL (H)).    No Known Allergies   Thank you for allowing pharmacy to be a part of this patient's care.  Shayon Trompeter, Rande Lawman 08/02/2017 8:22 AM

## 2017-08-02 NOTE — ED Notes (Signed)
Speech called to see when going to see pt. They are going to make her a priority on list.

## 2017-08-02 NOTE — H&P (Signed)
History and Physical    Deanna Baldwin ACZ:[id number] DOB: 04-18-1975 DOA: 08/01/2017  PCP: [name], DO  Patient coming from: Skilled nursing facility.  Chief Complaint: Fever and shortness of breath.  HPI: Deanna Baldwin is a 42 y.o. female with history of multiple sclerosis with paraplegia, large sacral decubitus ulcer, chronic anemia, hypothyroidism was expressing cough with shortness of breath and productive sputum last 2 days at her living facility.  Her roommate also had a similar complaints and was diagnosed with pneumonia.  Patient also had a fever and chills.  Denies any nausea vomiting abdominal pain or any discharge from the sacral decubitus ulcer.  ED Course: In the ER patient was tachycardic febrile and had a septic picture.  CT chest done shows multilobar pneumonia.  Patient was started on empiric antibiotics for sepsis secondary to pneumonia after blood cultures were obtained.  Patient blood pressure was in the lower range and appears to be chronic.  Review of Systems: As per HPI, rest all negative.   Past Medical History:  Diagnosis Date  . Buttock wound 03/03/2016  . Dysrhythmia    tachycardia  . MS (multiple sclerosis) ([place])   . Neutropenia ([place])   . Pneumonia 02/2016  . Protein calorie malnutrition ([place])   . Sacral osteomyelitis ([place]) 05/19/2017  . Severe sepsis ([place]) 03/03/2016  . 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 UTI (urinary tract infection) 02/2016    Past Surgical History:  Procedure Laterality Date  . DIVERTING ILEOSTOMY N/A 09/03/2016   Procedure: ILEAL CONDUIT  URINARY DIVERSION OPEN;  Surgeon: Ardis Hughs, MD;  Location: [facility];  Service: Urology;  Laterality: N/A;     reports that she quit smoking about 7 years ago. Her smoking use included cigarettes. She has a 10.00 pack-year smoking history. She has never used smokeless tobacco. She reports that she does not drink alcohol or use drugs.  No Known  Allergies  Family History  Problem Relation Age of Onset  . Mental illness Sister     Prior to Admission medications   Medication Sig Start Date End Date Taking? Authorizing Provider  acetaminophen (TYLENOL) 325 MG tablet Take 2 tablets (650 mg total) by mouth every 6 (six) hours as needed for mild pain (or Fever >/= 101). 04/11/17   Eugenie Filler, MD  Ascorbic Acid (VITAMIN C PO) Take by mouth.    [provider]  bisacodyl (DULCOLAX) 5 MG EC tablet Take 5 mg by mouth daily.    [provider]  CALCIUM ALGINATE EX Apply to coccyx and right ischial daily    [provider]  calcium-vitamin D (OSCAL WITH D) 500-200 MG-UNIT tablet Take 2 tablets by mouth 4 (four) times daily. 04/11/17   Eugenie Filler, MD  Cyanocobalamin (VITAMIN B-12 PO) Take 1 tablet by mouth daily.    [provider]  ENSURE (ENSURE) Take 237 mLs by mouth. With meals. Vanilla    [provider]  famotidine (PEPCID) 20 MG tablet Take 20 mg by mouth every morning.    [provider]  ferrous sulfate 325 (65 FE) MG tablet Take 325 mg by mouth daily with breakfast.    [provider]  guaiFENesin (MUCINEX) 600 MG 12 hr tablet Take 600 mg by mouth 2 (two) times daily. 04/21/17   [provider]  HYDROcodone-acetaminophen (NORCO/VICODIN) 5-325 MG tablet Take 1 tablet by mouth every 4 (four) hours as needed for moderate pain. 04/12/17   [provider]  ibuprofen (ADVIL,MOTRIN) 200 MG tablet Take 200 mg by mouth every 12 (twelve) hours as needed. 04/25/17   [provider]  levothyroxine (SYNTHROID) 50 MCG tablet Take 50 mcg by mouth daily before breakfast. 07/02/17   [provider]  magnesium oxide (MAG-OX) 400 (241.3 Mg) MG tablet Take 1 tablet (400 mg total) by mouth 2 (two) times daily. 04/23/17   Daleen Bo, MD  midodrine (PROAMATINE) 2.5 MG tablet Take 1 tablet (2.5 mg total) by mouth 3 (three) times daily with meals.  09/10/16   Elgergawy, Silver Huguenin, MD  Multiple Vitamin (MULTIVITAMIN) tablet Take 1 tablet by mouth daily. 06/13/17   [provider]  Nutritional Supplements (NUTRITIONAL SUPPLEMENT PO) Frozen Nutritional Treat with meals 06/15/17   [provider]  Nutritional Supplements (PROMOD) LIQD Protein Liquid - give 30 ml by mouth two times daily    [provider]  ondansetron (ZOFRAN) 4 MG tablet Take 4 mg by mouth every 6 (six) hours as needed for nausea or vomiting.    [provider]  polyethylene glycol (MIRALAX / GLYCOLAX) packet Take 17 g by mouth daily.    [provider]  Probiotic Product (PROBIOTIC PO) Take 1 tablet by mouth daily.    [provider]  tizanidine (ZANAFLEX) 2 MG capsule Take 2 mg by mouth 3 (three) times daily.    [provider]  UNABLE TO FIND PROTEIN LIQUID- 30 ml 2 TIMES DAILY RELATED TO UNSPECIFIED BUTTOCK    [provider]  Vitamin D, Ergocalciferol, (DRISDOL) 50000 units CAPS capsule Take 50,000 Units by mouth every 30 (thirty) days.    [provider]    Physical Exam: Vitals:   08/02/17 0415 08/02/17 0430 08/02/17 0445 08/02/17 0500  BP: (!) 91/54 (!) 90/55 (!) 92/58 (!) 95/56  Pulse: (!) 117 (!) 122 (!) 118 (!) 117  Resp: (!) 26 (!) 27 (!) 26 (!) 22  Temp:      TempSrc:      SpO2: 98% 98% 97% 99%  Weight:          Constitutional: Moderately built and nourished. Vitals:   08/02/17 0415 08/02/17 0430 08/02/17 0445 08/02/17 0500  BP: (!) 91/54 (!) 90/55 (!) 92/58 (!) 95/56  Pulse: (!) 117 (!) 122 (!) 118 (!) 117  Resp: (!) 26 (!) 27 (!) 26 (!) 22  Temp:      TempSrc:      SpO2: 98% 98% 97% 99%  Weight:       Eyes: Anicteric no pallor. ENMT: No discharge from the ears eyes nose or mouth. Neck: No mass felt.  No neck rigidity no JVD appreciated. Respiratory: No rhonchi or crepitations. Cardiovascular: S1-S2 heard tachycardic. Abdomen: Soft nontender bowel sounds  present. Musculoskeletal: No edema. Skin: Sacral decubitus difficult to assess at this positioning. Neurologic: Alert awake oriented to time place and person.  Paraplegic. Psychiatric: Appears normal.   Labs on Admission: I have personally reviewed following labs and imaging studies  CBC: Recent Labs  Lab 08/01/17 2347  WBC 12.5*  NEUTROABS 10.6*  HGB 8.0*  HCT 26.4*  MCV 103.5*  PLT 017*   Basic Metabolic Panel: Recent Labs  Lab 08/01/17 2347  NA 141  K 6.2*  CL 103  CO2 22  GLUCOSE 137*  BUN 45*  CREATININE 1.87*  CALCIUM 7.3*   GFR: Estimated Creatinine Clearance: 25.8 mL/min (A) (by C-G formula based on SCr of 1.87 mg/dL (H)). Liver Function Tests: Recent Labs  Lab 08/01/17 2347  AST 174*  ALT 87*  ALKPHOS 627*  BILITOT 1.8*  PROT 7.1  ALBUMIN 3.2*   No results for input(s): LIPASE, AMYLASE in the last 168 hours. No results for input(s): AMMONIA in the last 168 hours. Coagulation Profile: Recent Labs  Lab 08/02/17 0002  INR 1.21   Cardiac Enzymes: No results for input(s): CKTOTAL, CKMB, CKMBINDEX, TROPONINI in the last 168 hours. BNP (last 3 results) No results for input(s): PROBNP in the last 8760 hours. HbA1C: No results for input(s): HGBA1C in the last 72 hours. CBG: No results for input(s): GLUCAP in the last 168 hours. Lipid Profile: No results for input(s): CHOL, HDL, LDLCALC, TRIG, CHOLHDL, LDLDIRECT in the last 72 hours. Thyroid Function Tests: No results for input(s): TSH, T4TOTAL, FREET4, T3FREE, THYROIDAB in the last 72 hours. Anemia Panel: No results for input(s): VITAMINB12, FOLATE, FERRITIN, TIBC, IRON, RETICCTPCT in the last 72 hours. Urine analysis:    Component Value Date/Time   COLORURINE AMBER (A) 08/02/2017 0001   APPEARANCEUR CLOUDY (A) 08/02/2017 0001   LABSPEC 1.013 08/02/2017 0001   PHURINE 9.0 (H) 08/02/2017 0001   GLUCOSEU NEGATIVE 08/02/2017 0001   HGBUR NEGATIVE 08/02/2017 0001   BILIRUBINUR NEGATIVE  08/02/2017 0001   KETONESUR NEGATIVE 08/02/2017 0001   PROTEINUR 100 (A) 08/02/2017 0001   UROBILINOGEN 0.2 06/25/2008 1551   NITRITE NEGATIVE 08/02/2017 0001   LEUKOCYTESUR LARGE (A) 08/02/2017 0001   Sepsis Labs: @LABRCNTIP (procalcitonin:4,lacticidven:4) )No results found for this or any previous visit (from the past 240 hour(s)).   Radiological Exams on Admission: Dg Chest 2 View  Result Date: 08/02/2017 CLINICAL DATA:  42 year old female with fever. EXAM: CHEST - 2 VIEW COMPARISON:  Chest radiograph dated 03/17/2017 FINDINGS: There is a large left pleural effusion with consolidative changes of the majority of the left lung. Only a small aerated portion of the long noted in the left upper lobe. There is associated volume loss and shift of the mediastinum into the left hemithorax. The right lung is clear. No pneumothorax. No acute osseous pathology. IMPRESSION: Large left pleural effusion with opacification of the left hemithorax as described. CT may provide better evaluation of the size of the pleural effusion and assessment if thoracentesis is clinically considered. Electronically Signed   By: Anner Crete M.D.   On: 08/02/2017 01:23   Ct Chest W Contrast  Result Date: 08/02/2017 CLINICAL DATA:  Recent pneumonia, cough EXAM: CT CHEST WITH CONTRAST TECHNIQUE: Multidetector CT imaging of the chest was performed during intravenous contrast administration. CONTRAST:  45mL OMNIPAQUE IOHEXOL 300 MG/ML  SOLN COMPARISON:  08/02/2017 radiograph, CT chest 08/31/2016 FINDINGS: Cardiovascular: Nonaneurysmal aorta. Normal heart size. No significant pericardial effusion. Mediastinum/Nodes: Midline trachea. No thyroid mass. Borderline mediastinal lymph node, left paratracheal lymph node measures 9 mm. Lungs/Pleura: Small left-sided pleural effusion. Extensive consolidation within the left lower lobe with partial consolidation in the left upper lobe and ground-glass density. No pneumothorax. Similar  appearance of volume loss on the left. Upper Abdomen: 5 mm probable stone in the mid right kidney. Musculoskeletal: No chest wall abnormality. No acute or significant osseous findings. IMPRESSION: 1. Small left-sided pleural effusion. Extensive left lower lobe and left upper lobe consolidative process, most consistent with a pneumonia. 2. Nonobstructing stone within the right kidney. Electronically Signed   By: Donavan Foil M.D.   On: 08/02/2017 02:42    EKG: Independently reviewed.  Sinus tachycardia.  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 Sepsis likely from pneumonia -patient has been placed on empiric antibiotics for healthcare associated pneumonia.  Follow cultures lactate procalcitonin levels. 2. Sacral decubitus ulcer chronic stage IV -we will get wound team consult. 3. Possible UTI with urostomy -follow cultures. 4. Hypothyroidism on Synthroid. 5. Chronic anemia follow CBC on iron supplements. 6. Multiple sclerosis being followed by neurologist.   DVT prophylaxis: Lovenox. Code Status: Full code. Family Communication: Discussed with patient. Disposition Plan: Back to facility. Consults called: Wound team. Admission status: Inpatient.   Rise Patience MD [facility] Pager [phone].  If 7PM-7AM, please contact night-coverage [email or web address] Password [facility]  08/02/2017, 5:44 AM

## 2017-08-02 NOTE — Progress Notes (Signed)
Patient is admitted early this morning for sepsis.  Is bedbound from multiple sclerosis, she has urostomy, she has multiple skin wound.  She is found to have pneumonia.  Due to history of aspiration, swallow eval ordered.  Continue Vanco, started  on Zosyn to cover aspiration pneumonia.  Cussed with infectious disease Dr. Megan Salon who will see the patient in consult.

## 2017-08-02 NOTE — Consult Note (Addendum)
[place] [facility] Nurse wound consult note Reason for Consult: sacrum, however patient is known to the [facility] nurse from previous admissions  Wound type: Note each wound is a Stage 4 that is healing  Sacrum: 2cm x3cm x 0.1cm; 100% pink, pale, clean Right ischium: 2cm x2cm x 0.1cm; 100% clean, pale, pink Left ischium: 5cm x 6.5cm x 0.3cm; 95% pale pink, moist/5% fibrinous material along proximal wound edges with epibole of the wound edges  Pressure Injury POA: Yes Wound bed: see above  Drainage (amount, consistency, odor) moderate to heavy from the right and left ischial wounds. Minimal from the sacrum. The wounds do have a slight odor but do not appear to be infected. Feel that the odor and drainage is related long standing history of osteomyelitis of the sacrum and bilateral ischial tuberosities  Periwound: intact  Dressing procedure/placement/frequency: Silver hydrofiber for exudate/drainage control, top with foam dressing. Change every other day. Needs low air loss mattress, requested to be placed in ED or on the floor as soon as possible. Patient is eating however do not feel adequate intake for any sort of wound healing benefit.  [employer] Nurse ostomy consult note (patient noted to have urostomy) Stoma type/location: RLQ Stomal assessment/size: 1 1/8" oval shaped Peristomal assessment: NA Treatment options for stomal/peristomal skin: NA Output yellow urine with sediment, odor noted Ostomy pouching: 1pc. Hooked to BSD.  Will need 1pc ostomy pouch if admitted Kellie Simmering # 3)   Discussed POC with patient and bedside nurse.  Re consult if needed, will not follow at this time. Thanks  Tejal Monroy [employer], RN,CWOCN, CNS, [place] [phone])

## 2017-08-02 NOTE — Progress Notes (Signed)
Pharmacy Antibiotic Note  Deanna Baldwin is a 42 y.o. female admitted on 08/01/2017 with pneumonia.  Pharmacy has been consulted for vancomycin and cefepime dosing.Initial doses ordered in the ED  Plan: Continue cefepime 1gm IV q8 hours Continue vancomycin 1gm IV q24 hours F/u renal function, cultures and clinical course  Weight: 91 lb (41.3 kg)  Temp (24hrs), Avg:103.2 F (39.6 C), Min:103.2 F (39.6 C), Max:103.2 F (39.6 C)  No results for input(s): WBC, CREATININE, LATICACIDVEN, VANCOTROUGH, VANCOPEAK, VANCORANDOM, GENTTROUGH, GENTPEAK, GENTRANDOM, TOBRATROUGH, TOBRAPEAK, TOBRARND, AMIKACINPEAK, AMIKACINTROU, AMIKACIN in the last 168 hours.  Estimated Creatinine Clearance: 58.2 mL/min (by C-G formula based on SCr of 0.83 mg/dL).    No Known Allergies   Thank you for allowing pharmacy to be a part of this patient's care.  Excell Seltzer [place] 08/02/2017 12:07 AM

## 2017-08-02 NOTE — Consult Note (Signed)
[facility] for Infectious Disease    Date of Admission:  08/01/2017     Total days of antibiotics 0               Reason for Consult: Pneumonia   Referring Provider: Hal Hope Primary Care Provider: [name], DO   Assessment/Plan:  Ms. Osika is a 42 y/o female with previous medical history of multiple sclerosis with paraplegia, sacral decubital ulcer, urostomy with multiple UTIs and Sacral osteomyelitis presenting to the ED from her SNF with acute onset cough, fever and shortness of breath. Symptoms and exam are consistent with pneumonia confirmed on CT scan. She remains febrile with most recent temperature of 103. Her wounds have been evaluated by [facility] RN and appear with drainage without evidence of acute infection. Urine unlikely source of infection. Narrow spectrum as anaerobic coverage does not appear necessary at this time.   1. Discontinue piperacillin-tazobactam and start Cefepime.  2. Continue current dosage of vancomycin. 3. Monitor cultures for growth. Unlikely be able to produce a sputum sample.    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 bisacodyl  5 mg Oral Daily  . calcium-vitamin D  2 tablet Oral QID  . enoxaparin (LOVENOX) injection  20 mg Subcutaneous Q24H  . famotidine  20 mg Oral Daily  . feeding supplement (ENSURE ENLIVE)  237 mL Oral TID BM  . ferrous sulfate  325 mg Oral Q breakfast  . guaiFENesin  600 mg Oral BID  . levothyroxine  50 mcg Oral QAC breakfast  . magnesium oxide  400 mg Oral BID  . midodrine  2.5 mg Oral TID WC  . multivitamin with minerals  1 tablet Oral Daily  . polyethylene glycol  17 g Oral Daily  . tiZANidine  2 mg Oral TID  . vitamin B-12  100 mcg Oral Daily  . vitamin C  500 mg Oral Daily     HPI: Elaria Osias is a 42  y.o. female with a previous medical history of multiple sclerosis with paraplegia, sacral decubital ulcer, urostomy with multiple UTIs and Sacral osteomyelitis who presented to the ED with the chief complaint of cough, fever, chills and shortness of breath going on for the past 2 days. She is a resident of living facility and noted that her roommate had similar complaints and was diagnosed with pneumonia.   Ms. Shevchenko completed 8 weeks of antimicrobial therapy with ceftriaxone, vancomycin and metronidazole for sacral osteomyelitis on 05/14/17 and was most recently evaluated in the ID office by Dr. Tommy Medal. Wounds have been evaluated by [facility] with no obvious evidence of infection and moderate drainage consistent with chronic osteomyelitis.   In the ED she had a fever of103.2, hypoxia with SPO2 of 87% and tachycardic with heart rate of 149. Lab work showed hyperkalemia of 6.2, creatinine of 1.87, AST 174, and WBC of  12.5. CT imaging with extensive consolidative process in the left upper and lower lobe consistent with pneumonia.   She has consistent fevers with the most recent being 103. She is currently receiving pipercillin-tazobactam and vancomycin. She states that she is feeling much better since being in the hospital. Describes having a non-productive cough for the past couple of days. Denies urinary symptoms or coughing with fluids.   Review of Systems: Review  of Systems  Constitutional: Positive for fever and malaise/fatigue. Negative for chills.  Respiratory: Positive for cough and shortness of breath. Negative for sputum production and wheezing.   Cardiovascular: Negative for chest pain and leg swelling.  Gastrointestinal: Negative for abdominal pain, constipation, diarrhea, nausea and vomiting.  Skin: Negative for rash.     Past Medical History:  Diagnosis Date  . Buttock wound 03/03/2016  . Dysrhythmia    tachycardia  . MS (multiple sclerosis) ([place])   . Neutropenia ([place])   .  Pneumonia 02/2016  . Protein calorie malnutrition ([place])   . Sacral osteomyelitis ([place]) 05/19/2017  . Severe sepsis ([place]) 03/03/2016  . 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 UTI (urinary tract infection) 02/2016    Social History   Tobacco Use  . Smoking status: Former Smoker    Packs/day: 1.00    Years: 10.00    Pack years: 10.00    Types: Cigarettes    Last attempt to quit: 02/01/2010    Years since quitting: 7.5  . Smokeless tobacco: Never Used  Substance Use Topics  . Alcohol use: No  . Drug use: No    Family History  Problem Relation Age of Onset  . Mental illness Sister     No Known Allergies  OBJECTIVE: Blood pressure (!) 103/57, pulse (!) 133, temperature (!) 103 F (39.4 C), temperature source Oral, resp. rate (!) 25, weight 91 lb (41.3 kg), last menstrual period 01/28/2016, SpO2 94 %.  Physical Exam  Constitutional: She is oriented to person, place, and time. She appears cachectic. She is cooperative. She appears ill. No distress. Nasal cannula in place.  Cardiovascular: Regular rhythm, normal heart sounds and intact distal pulses. Tachycardia present. Exam reveals no gallop and no friction rub.  No murmur heard. Pulmonary/Chest: Effort normal and breath sounds normal. No stridor. No respiratory distress. She has no wheezes. She has no rales.  Abdominal:  Urostomy present and appears clean, dry and intact.   Neurological: She is alert and oriented to person, place, and time.  Skin: Skin is warm and dry.  Psychiatric: She has a normal mood and affect. Her behavior is normal.    Lab Results Lab Results  Component Value Date   WBC 11.0 (H) 08/02/2017   HGB 7.1 (L) 08/02/2017   HCT 23.3 (L) 08/02/2017   MCV 106.4 (H) 08/02/2017   PLT 422 (H) 08/02/2017    Lab Results  Component Value Date   CREATININE 1.39 (H) 08/02/2017   BUN 36 (H) 08/02/2017   NA 145 08/02/2017   K 4.5 08/02/2017   CL 109 08/02/2017   CO2 23 08/02/2017      Lab Results  Component Value Date   ALT 87 (H) 08/01/2017   AST 174 (H) 08/01/2017   ALKPHOS 627 (H) 08/01/2017   BILITOT 1.8 (H) 08/01/2017     Microbiology: Recent Results (from the past 240 hour(s))  MRSA PCR Screening     Status: Abnormal   Collection Time: 08/02/17  7:44 AM  Result Value Ref Range Status   MRSA by PCR POSITIVE (A) NEGATIVE Final    Comment:        The GeneXpert MRSA Assay (FDA approved for NASAL specimens only), is one component of a comprehensive MRSA colonization surveillance program. It is not intended to diagnose MRSA infection nor to guide or monitor treatment for MRSA infections. RESULT CALLED TO, READ BACK BY AND VERIFIED WITH: Berlinda Last RN 12:10 08/02/08 ([email or web address]) Performed at  [facility], [place] [address] [place], [place] [address]      Terri Piedra, [place] for [place] [phone] Pager  08/02/2017  4:39 PM

## 2017-08-02 NOTE — ED Notes (Signed)
This RN notified by Micro pt has a positive MRSA screen.  Admitting MD made aware and patient placed on contact precautions.

## 2017-08-02 NOTE — ED Notes (Signed)
Hospitialist called to inform of elevated lactic

## 2017-08-02 NOTE — ED Notes (Signed)
Airmatt[facility] ordered.

## 2017-08-02 NOTE — Evaluation (Addendum)
Clinical/Bedside Swallow Evaluation Patient Details  Name: Deanna Baldwin MRN: [id number] Date of Birth: 1975-10-30  Today's Date: 08/02/2017 Time: SLP Start Time (ACUTE ONLY): 1315 SLP Stop Time (ACUTE ONLY): 1350 SLP Time Calculation (min) (ACUTE ONLY): 35 min  Past Medical History:  Past Medical History:  Diagnosis Date  . Buttock wound 03/03/2016  . Dysrhythmia    tachycardia  . MS (multiple sclerosis) ([place])   . Neutropenia ([place])   . Pneumonia 02/2016  . Protein calorie malnutrition ([place])   . Sacral osteomyelitis ([place]) 05/19/2017  . Severe sepsis ([place]) 03/03/2016  . 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 UTI (urinary tract infection) 02/2016   Past Surgical History:  Past Surgical History:  Procedure Laterality Date  . DIVERTING ILEOSTOMY N/A 09/03/2016   Procedure: ILEAL CONDUIT  URINARY DIVERSION OPEN;  Surgeon: Ardis Hughs, MD;  Location: [facility];  Service: Urology;  Laterality: N/A;   HPI:  42 year old female admitted 08/01/17 with sepsis. PMH: MS with spastic paraplegia, PNA, silent aspiration. CXR = LLL/LUL consolidative process c/w PNA, left pleural effusion   Assessment / Plan / Recommendation Clinical Impression  Pt known to ST services, having had BSE/MBS over the past year and a half. Today, pt presents with generalized oral motor weakness, reduced intelligibility, and congested nonproductive cough. PO trials of thin, puree, and solid were tolerated without overt s/s aspiration, however, pt has a history of silent aspiration on thin and nectar thick liquids (MBS February 2018). RN was present to administer po meds, which pt tolerated whole with liquid. Recommend continuing current diet, meds whole with liquid, and repeat MBS to rule out silent aspiration. Will schedule with radiology for tomorrow. Pt, RN, and MD aware of results and recommendations.   SLP Visit Diagnosis: Dysphagia, unspecified (R13.10)    Aspiration Risk  Moderate aspiration  risk - history of silent aspiration per MBS   Diet Recommendation Regular;Thin liquid   Liquid Administration via: Straw Medication Administration: Whole meds with liquid Supervision: Full supervision/cueing for compensatory strategies;Staff to assist with self feeding Compensations: Minimize environmental distractions;Slow rate;Small sips/bites Postural Changes: Seated upright at 90 degrees;Remain upright for at least 30 minutes after po intake    Other  Recommendations Oral Care Recommendations: Oral care BID   Follow up Recommendations (TBD)      Frequency and Duration (pending MBS)          Prognosis Prognosis for Safe Diet Advancement: Fair Barriers to Reach Goals: Time post onset      Swallow Study   General Date of Onset: 08/01/17 HPI: 42 year old female admitted 08/01/17 with sepsis. PMH: MS, CXR = LLL/LUL consolidative process c/w PNA, left pleural effusion Type of Study: Bedside Swallow Evaluation Previous Swallow Assessment: BSE 03/2017, MBS 03/2016 - silent aspiration of thin/NTL Diet Prior to this Study: Regular;Thin liquids Temperature Spikes Noted: No Respiratory Status: Nasal cannula History of Recent Intubation: No Behavior/Cognition: Alert;Cooperative;Pleasant mood Oral Cavity Assessment: Within Functional Limits Oral Care Completed by SLP: No Oral Cavity - Dentition: Missing dentition Vision: Functional for self-feeding Self-Feeding Abilities: Needs assist;Needs set up Patient Positioning: Upright in bed Baseline Vocal Quality: Normal Volitional Cough: Congested Volitional Swallow: Able to elicit    Oral/Motor/Sensory Function Overall Oral Motor/Sensory Function: (generalized weakness)   Ice Chips Ice chips: Not tested   Thin Liquid Thin Liquid: Within functional limits Presentation: Straw    Nectar Thick Nectar Thick Liquid: Not tested   Honey Thick Honey Thick Liquid: Not tested  Puree Puree: Within functional limits Presentation: Spoon    Solid   GO   Solid: Within functional limits Presentation: [place] Quentin Ore, [facility], [place] Speech Language Pathologist [phone]  Shonna Chock 08/02/2017,1:58 PM

## 2017-08-02 NOTE — ED Notes (Addendum)
GCS 14. Pt able to speak. Pt febrile. Cough present. Pupils equal and reactive to light. Abdomen soft and non distended. Pt arrived to facility with ileotomy with drainage bag in place.

## 2017-08-02 NOTE — ED Notes (Signed)
Pt transferred to air mattress at this time- tolerated well

## 2017-08-03 ENCOUNTER — Inpatient Hospital Stay (HOSPITAL_COMMUNITY): Payer: Medicare Other

## 2017-08-03 ENCOUNTER — Encounter (HOSPITAL_COMMUNITY): Payer: Self-pay | Admitting: General Practice

## 2017-08-03 LAB — CBC
HCT: 19.4 % — ABNORMAL LOW (ref 36.0–46.0)
HCT: 29.2 % — ABNORMAL LOW (ref 36.0–46.0)
HEMOGLOBIN: 5.9 g/dL — AB (ref 12.0–15.0)
Hemoglobin: 9.4 g/dL — ABNORMAL LOW (ref 12.0–15.0)
MCH: 31.7 pg (ref 26.0–34.0)
MCH: 29.6 pg (ref 26.0–34.0)
MCHC: 30.4 g/dL (ref 30.0–36.0)
MCHC: 32.2 g/dL (ref 30.0–36.0)
MCV: 104.3 fL — ABNORMAL HIGH (ref 78.0–100.0)
MCV: 91.8 fL (ref 78.0–100.0)
PLATELETS: 422 10*3/uL — AB (ref 150–400)
Platelets: 353 10*3/uL (ref 150–400)
RBC: 1.86 MIL/uL — ABNORMAL LOW (ref 3.87–5.11)
RBC: 3.18 MIL/uL — AB (ref 3.87–5.11)
RDW: 12.2 % (ref 11.5–15.5)
RDW: 18.3 % — ABNORMAL HIGH (ref 11.5–15.5)
WBC: 8 10*3/uL (ref 4.0–10.5)
WBC: 9.4 10*3/uL (ref 4.0–10.5)

## 2017-08-03 LAB — URINE CULTURE

## 2017-08-03 LAB — BASIC METABOLIC PANEL
ANION GAP: 9 (ref 5–15)
BUN: 25 mg/dL — ABNORMAL HIGH (ref 6–20)
CALCIUM: 6.6 mg/dL — AB (ref 8.9–10.3)
CO2: 22 mmol/L (ref 22–32)
Chloride: 113 mmol/L — ABNORMAL HIGH (ref 98–111)
Creatinine, Ser: 1 mg/dL (ref 0.44–1.00)
GFR calc Af Amer: >60 mL/min (ref >60)
GLUCOSE: 101 mg/dL — AB (ref 70–99)
Potassium: 4.1 mmol/L (ref 3.5–5.1)
SODIUM: 144 mmol/L (ref 135–145)

## 2017-08-03 LAB — PREPARE RBC (CROSSMATCH)

## 2017-08-03 LAB — LACTIC ACID, PLASMA: LACTIC ACID, VENOUS: 1.4 mmol/L (ref 0.5–1.9)

## 2017-08-03 MED ORDER — SODIUM CHLORIDE 0.9% IV SOLUTION
Freq: Once | INTRAVENOUS | Status: AC
  Administered 2017-08-05: 22:00:00 via INTRAVENOUS

## 2017-08-03 NOTE — Progress Notes (Signed)
[facility] for Infectious Disease  Date of Admission:  08/01/2017     Total days of antibiotics 1         ASSESSMENT/PLAN  Deanna Baldwin is a 42 y/o female with previous medical history of MS with paraplegia, sacral ulcer, urostomy and sacral osteomyelitis admitted from her SNF and found to have pneumonia. Appears to be stabilizing with improved lactic acid and has been afebrile since yesterday afternoon. Feeling a little worse today.   1. Continue current dose of vancomycin and cefepime. Consider narrowing antibiotics in the next 24-48 hours as she has been fever free for less than 24 hours.   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 sodium chloride   Intravenous Once  . bisacodyl  5 mg Oral Daily  . calcium-vitamin D  2 tablet Oral QID  . enoxaparin  20 mg Subcutaneous Q24H  . famotidine  20 mg Oral Daily  . feeding supplement (ENSURE ENLIVE)  237 mL Oral TID BM  . ferrous sulfate  325 mg Oral Q breakfast  . guaiFENesin  600 mg Oral BID  . levothyroxine  50 mcg Oral QAC breakfast  . magnesium oxide  400 mg Oral BID  . midodrine  2.5 mg Oral TID WC  . multivitamin with minerals  1 tablet Oral Daily  . polyethylene glycol  17 g Oral Daily  . tiZANidine  2 mg Oral TID  . vitamin B-12  100 mcg Oral Daily  . vitamin C  500 mg Oral Daily    SUBJECTIVE:  Afebrile overnight with WBC of 8. Lactic acid improved. Hemoglobin low at 5.9 and awaiting transfusion. Blood cultures with no growth.   Feeling a little worse today. Can feel the pneumonia in her chest. Denies fevers, chills or sweats.   No Known Allergies   Review of Systems: Review of Systems  Constitutional: Negative for chills, fever and malaise/fatigue.  Respiratory: Positive for cough. Negative for shortness of breath  and wheezing.   Cardiovascular: Negative for chest pain and leg swelling.  Gastrointestinal: Negative for abdominal pain, constipation, diarrhea, nausea and vomiting.  Skin: Negative for rash.      OBJECTIVE: Vitals:   08/03/17 0500 08/03/17 0820 08/03/17 0832 08/03/17 0859  BP: (!) 91/55  96/64 (!) 98/57  Pulse: (!) 109  (!) 116   Resp:   (!) 30   Temp:  99.2 F (37.3 C) 99.3 F (37.4 C) 99.9 F (37.7 C)  TempSrc:  Oral Oral Oral  SpO2: 95%  99%   Weight:      Height:       Body mass index is 13.44 kg/m.  Physical Exam  Constitutional: She is oriented to person, place, and time. She appears cachectic. She is cooperative. She appears ill. No distress.  Cardiovascular: Normal rate, regular rhythm, normal heart sounds and intact distal pulses.  Pulmonary/Chest: Effort normal. She has no wheezes. She has rhonchi in the left upper field and the left middle field.  Neurological: She is alert and oriented to person, place, and time.  Skin: Skin is warm and dry.  Psychiatric: She has a normal mood and affect. Her behavior is normal. Judgment and thought content normal.    Lab Results Lab Results  Component Value Date   WBC 8.0 08/03/2017  HGB 5.9 (LL) 08/03/2017   HCT 19.4 (L) 08/03/2017   MCV 104.3 (H) 08/03/2017   PLT 353 08/03/2017    Lab Results  Component Value Date   CREATININE 1.00 08/03/2017   BUN 25 (H) 08/03/2017   NA 144 08/03/2017   K 4.1 08/03/2017   CL 113 (H) 08/03/2017   CO2 22 08/03/2017    Lab Results  Component Value Date   ALT 87 (H) 08/01/2017   AST 174 (H) 08/01/2017   ALKPHOS 627 (H) 08/01/2017   BILITOT 1.8 (H) 08/01/2017     Microbiology: Recent Results (from the past 240 hour(s))  Urine culture     Status: Abnormal   Collection Time: 08/02/17 12:01 AM  Result Value Ref Range Status   Specimen Description URINE, CATHETERIZED  Final   Special Requests   Final    Immunocompromised Performed at [facility], [place] [address] [place], [place] [address]    Culture MULTIPLE SPECIES PRESENT, SUGGEST RECOLLECTION (A)  Final   Report Status 08/03/2017 FINAL  Final  Culture, blood (Routine x 2)     Status: None (Preliminary result)   Collection Time: 08/02/17 12:02 AM  Result Value Ref Range Status   Specimen Description SITE NOT SPECIFIED  Final   Special Requests   Final    BOTTLES DRAWN AEROBIC AND ANAEROBIC Blood Culture results may not be optimal due to an inadequate volume of blood received in culture bottles   Culture   Final    NO GROWTH 1 DAY Performed at [facility], [place] [address] [place], [place] [address]    Report Status PENDING  Incomplete  Culture, blood (Routine x 2)     Status: None (Preliminary result)   Collection Time: 08/02/17 12:03 AM  Result Value Ref Range Status   Specimen Description BLOOD LEFT WRIST  Final   Special Requests   Final    BOTTLES DRAWN AEROBIC AND ANAEROBIC Blood Culture results may not be optimal due to an excessive volume of blood received in culture bottles   Culture   Final    NO GROWTH 1 DAY Performed at [facility], [place] [address], [place], [place] [address]    Report Status PENDING  Incomplete  MRSA PCR Screening     Status: Abnormal   Collection Time: 08/02/17  7:44 AM  Result Value Ref Range Status   MRSA by PCR POSITIVE (A) NEGATIVE Final    Comment:        The GeneXpert MRSA Assay (FDA approved for NASAL specimens only), is one component of a comprehensive MRSA colonization surveillance program. It is not intended to diagnose MRSA infection nor to guide or monitor treatment for MRSA infections. RESULT CALLED TO, READ BACK BY AND VERIFIED WITH: Berlinda Last RN 12:10 08/02/08 ([email or web address]) Performed at [facility], [place] [address] [place], [place] [address]      Terri Piedra, [place] [facility] [place] Pager  08/03/2017  10:54 AM

## 2017-08-03 NOTE — Progress Notes (Signed)
PROGRESS NOTE                                                                                                                                                                                                             Patient Demographics:    Deanna Baldwin, is a 42 y.o. female, DOB - 11-21-1975, STM:[id number]  Admit date - 08/01/2017   Admitting Physician Rise Patience, MD  Outpatient Primary MD for the patient is [name], DO  LOS - 1   Chief Complaint  Patient presents with  . Shortness of Breath       Brief Narrative   42 y.o. female with history of multiple sclerosis with paraplegia, large sacral decubitus ulcer, sacral osteomyelitis chronic anemia, hypothyroidism was expressing cough with shortness of breath and productive sputum last 2 days at her living facility.  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  Sacral decubitus ulcer -Continue with wound care  Anemia of chronic illness -Work-up significant for anemia of chronic disease, she has significant drop 7.1-5.9 since admission, this is most likely due to to IV fluids as she had drop on all of her cell lines, she does not have any evidence of significant GI bleed, her stools were noted to be normal color today, but pulse still pending, she was ordered 2  units PRBC transfusion, continue to monitor closely. -Is with a recent hospitalization in March 2019, required 2 units PRBC transfusion  Hypothyroidism -New with Synthroid  Multiple sclerosis -She is following with neurology as an outpatient  Chronic hypotension - continue with midodrine  Spastic paraplegia -Continue with home regimen including Zanaflex and baclofen     Code Status : Full  Family  Communication  : none at bedside  Disposition Plan  : back to SNF when stable  Consults  :  ID  Procedures  : none  DVT Prophylaxis  :  lovenox  Lab Results  Component Value Date   PLT 353 08/03/2017    Antibiotics  :    Anti-infectives (From admission, onward)   Start     Dose/Rate Route Frequency Ordered Stop   08/03/17 0000  vancomycin (VANCOCIN) IVPB 1000 mg/200 mL premix     1,000 mg 200 mL/hr over 60 Minutes Intravenous Every 24 hours 08/02/17 0012     08/02/17 2200  ceFEPIme (MAXIPIME) 1 g in sodium chloride 0.9 % 100 mL IVPB     1 g 200 mL/hr over 30 Minutes Intravenous Every 24 hours 08/02/17 1745     08/02/17 0830  piperacillin-tazobactam (ZOSYN) IVPB 3.375 g  Status:  Discontinued     3.375 g 12.5 mL/hr over 240 Minutes Intravenous Every 8 hours 08/02/17 0801 08/02/17 1716   08/02/17 0800  ceFEPIme (MAXIPIME) 1 g in sodium chloride 0.9 % 100 mL IVPB  Status:  Discontinued     1 g 200 mL/hr over 30 Minutes Intravenous Every 8 hours 08/02/17 0012 08/02/17 0744   08/02/17 0000  ceFEPIme (MAXIPIME) 2 g in sodium chloride 0.9 % 100 mL IVPB     2 g 200 mL/hr over 30 Minutes Intravenous  Once 08/01/17 2352 08/02/17 0136   08/02/17 0000  vancomycin (VANCOCIN) IVPB 1000 mg/200 mL premix     1,000 mg 200 mL/hr over 60 Minutes Intravenous  Once 08/01/17 2352 08/02/17 0206        Objective:   Vitals:   08/03/17 0859 08/03/17 1126 08/03/17 1501 08/03/17 1524  BP: (!) 98/57 (!) 83/59 98/61 104/84  Pulse:  82 85   Resp:  (!) 25 (!) 25   Temp: 99.9 F (37.7 C)  98.2 F (36.8 C) 98.1 F (36.7 C) 97.7 F (36.5 C)  TempSrc: Oral Oral Oral Oral  SpO2:  100% 93%   Weight:      Height:        Wt Readings from Last 3 Encounters:  08/01/17 41.3 kg (91 lb)  07/07/17 40.9 kg (90 lb 3.2 oz)  05/09/17 41.8 kg (92 lb 3.2 oz)     Intake/Output Summary (Last 24 hours) at 08/03/2017 1557 Last data filed at 08/03/2017 1509 Gross per 24 hour  Intake 740.75 ml  Output -  Net 740.75 ml     Physical Exam  Patient was seen and examined with the presence of her nurse Kayla  Awake Alert, Oriented X 3, No new F.N deficits, Normal affect.  Pale conjunctiva Symmetrical Chest wall movement, Good air movement bilaterally, CTAB RRR,No Gallops,Rubs or new Murmurs, No Parasternal Heave +ve B.Sounds, Abd Soft, No rebound - guarding or rigidity. Sacral decubitus ulcer, ulcers, or covered, had normal color stool in bed, contracted lower extremities    Data Review:    CBC Recent Labs  Lab 08/01/17 2347 08/02/17 0652 08/03/17 0427  WBC 12.5* 11.0* 8.0  HGB 8.0* 7.1* 5.9*  HCT 26.4* 23.3* 19.4*  PLT 487* 422* 353  MCV 103.5* 106.4* 104.3*  MCH 31.4 32.4 31.7  MCHC 30.3 30.5 30.4  RDW 12.0 12.2 12.2  LYMPHSABS 0.8  --   --   MONOABS 0.8  --   --   EOSABS 0.1  --   --   BASOSABS 0.0  --   --  Chemistries  Recent Labs  Lab 08/01/17 2347 08/02/17 0652 08/02/17 0731 08/03/17 0427  NA 141  --  145 144  K 6.2*  --  4.5 4.1  CL 103  --  109 113*  CO2 22  --  23 22  GLUCOSE 137*  --  136* 101*  BUN 45*  --  36* 25*  CREATININE 1.87* 1.49* 1.39* 1.00  CALCIUM 7.3*  --  7.1* 6.6*  AST 174*  --   --   --   ALT 87*  --   --   --   ALKPHOS 627*  --   --   --   BILITOT 1.8*  --   --   --    ------------------------------------------------------------------------------------------------------------------ No results for input(s): CHOL, HDL, LDLCALC, TRIG, CHOLHDL, LDLDIRECT in the last 72 hours.  Lab Results  Component Value Date   HGBA1C 5.5  06/24/2016   ------------------------------------------------------------------------------------------------------------------ No results for input(s): TSH, T4TOTAL, T3FREE, THYROIDAB in the last 72 hours.  Invalid input(s): FREET3 ------------------------------------------------------------------------------------------------------------------ No results for input(s): VITAMINB12, FOLATE, FERRITIN, TIBC, IRON, RETICCTPCT in the last 72 hours.  Coagulation profile Recent Labs  Lab 08/02/17 0002 08/02/17 0652  INR 1.21 1.35    No results for input(s): DDIMER in the last 72 hours.  Cardiac Enzymes No results for input(s): CKMB, TROPONINI, MYOGLOBIN in the last 168 hours.  Invalid input(s): CK ------------------------------------------------------------------------------------------------------------------ No results found for: BNP  Inpatient Medications  Scheduled Meds: . sodium chloride   Intravenous Once  . bisacodyl  5 mg Oral Daily  . calcium-vitamin D  2 tablet Oral QID  . enoxaparin  20 mg Subcutaneous Q24H  . famotidine  20 mg Oral Daily  . feeding supplement (ENSURE ENLIVE)  237 mL Oral TID BM  . ferrous sulfate  325 mg Oral Q breakfast  . guaiFENesin  600 mg Oral BID  . levothyroxine  50 mcg Oral QAC breakfast  . magnesium oxide  400 mg Oral BID  . midodrine  2.5 mg Oral TID WC  . multivitamin with minerals  1 tablet Oral Daily  . polyethylene glycol  17 g Oral Daily  . tiZANidine  2 mg Oral TID  . vitamin B-12  100 mcg Oral Daily  . vitamin C  500 mg Oral Daily   Continuous Infusions: . ceFEPime (MAXIPIME) IV 1 g (08/02/17 2241)  . vancomycin 1,000 mg (08/02/17 2319)   PRN Meds:.acetaminophen **OR** acetaminophen, HYDROcodone-acetaminophen, ondansetron **OR** ondansetron (ZOFRAN) IV  Micro Results Recent Results (from the past 240 hour(s))  Urine culture     Status: Abnormal   Collection Time: 08/02/17 12:01 AM  Result Value Ref Range Status   Specimen  Description URINE, CATHETERIZED  Final   Special Requests   Final    Immunocompromised Performed at [facility], [place] [address], [place], [place] [address]    Culture MULTIPLE SPECIES PRESENT, SUGGEST RECOLLECTION (A)  Final   Report Status 08/03/2017 FINAL  Final  Culture, blood (Routine x 2)     Status: None (Preliminary result)   Collection Time: 08/02/17 12:02 AM  Result Value Ref Range Status   Specimen Description SITE NOT SPECIFIED  Final   Special Requests   Final    BOTTLES DRAWN AEROBIC AND ANAEROBIC Blood Culture results may not be optimal due to an inadequate volume of blood received in culture bottles   Culture   Final    NO GROWTH 1 DAY Performed at [facility], [place] [address],  [place], [place] [address]    Report Status PENDING  Incomplete  Culture, blood (Routine x 2)     Status: None (Preliminary result)   Collection Time: 08/02/17 12:03 AM  Result Value Ref Range Status   Specimen Description BLOOD LEFT WRIST  Final   Special Requests   Final    BOTTLES DRAWN AEROBIC AND ANAEROBIC Blood Culture results may not be optimal due to an excessive volume of blood received in culture bottles   Culture   Final    NO GROWTH 1 DAY Performed at [place] [address] [place], [place] [address]    Report Status PENDING  Incomplete  MRSA PCR Screening     Status: Abnormal   Collection Time: 08/02/17  7:44 AM  Result Value Ref Range Status   MRSA by PCR POSITIVE (A) NEGATIVE Final    Comment:        The GeneXpert MRSA Assay (FDA approved for NASAL specimens only), is one component of a comprehensive MRSA colonization surveillance program. It is not intended to diagnose MRSA infection nor to guide or monitor treatment for MRSA infections. RESULT CALLED TO, READ BACK BY AND VERIFIED WITH: Berlinda Last RN 12:10 08/02/08 ([email or web address]) Performed at [facility], [place] [address] [place], [place] [address]     Radiology Reports Dg Chest 2  View  Result Date: 08/02/2017 CLINICAL DATA:  42 year old female with fever. EXAM: CHEST - 2 VIEW COMPARISON:  Chest radiograph dated 03/17/2017 FINDINGS: There is a large left pleural effusion with consolidative changes of the majority of the left lung. Only a small aerated portion of the long noted in the left upper lobe. There is associated volume loss and shift of the mediastinum into the left hemithorax. The right lung is clear. No pneumothorax. No acute osseous pathology. IMPRESSION: Large left pleural effusion with opacification of the left hemithorax as described. CT may provide better evaluation of the size of the pleural effusion and assessment if thoracentesis is clinically considered. Electronically Signed   By: Anner Crete M.D.   On: 08/02/2017 01:23   Ct Chest W Contrast  Result Date: 08/02/2017 CLINICAL DATA:  Recent pneumonia, cough EXAM: CT CHEST WITH CONTRAST TECHNIQUE: Multidetector CT imaging of the chest was performed during intravenous contrast administration. CONTRAST:  26mL OMNIPAQUE IOHEXOL 300 MG/ML  SOLN COMPARISON:  08/02/2017 radiograph, CT chest 08/31/2016 FINDINGS: Cardiovascular: Nonaneurysmal aorta. Normal heart size. No significant pericardial effusion. Mediastinum/Nodes: Midline trachea. No thyroid mass. Borderline mediastinal lymph node, left paratracheal lymph node measures 9 mm. Lungs/Pleura: Small left-sided pleural effusion. Extensive consolidation within the left lower lobe with partial consolidation in the left upper lobe and ground-glass density. No pneumothorax. Similar appearance of volume loss on the left. Upper Abdomen: 5 mm probable stone in the mid right kidney. Musculoskeletal: No chest wall abnormality. No acute or significant osseous findings. IMPRESSION: 1. Small left-sided pleural effusion. Extensive left lower lobe and left upper lobe consolidative process, most consistent with a pneumonia. 2. Nonobstructing stone within the right kidney. Electronically  Signed   By: Donavan Foil M.D.   On: 08/02/2017 02:42      Phillips Climes M.D on 08/03/2017 at 3:57 PM  Between 7am to 7pm - Pager - [phone]  After 7pm go to [email or web address] - password [facility]  [facility] -  Office  [phone]

## 2017-08-03 NOTE — Clinical Social Work Note (Signed)
Clinical Social Work Assessment  Patient Details  Name: Deanna Baldwin MRN: [id number] Date of Birth: 08/06/1975  Date of referral:  08/03/17               Reason for consult:  Discharge Planning                Permission sought to share information with:  Facility Sport and exercise psychologist, Family Supports Permission granted to share information::     Name::        Agency::  [employer]  Relationship::     Contact Information:     Housing/Transportation Living arrangements for the past 2 months:  [place] of Information:  Patient Patient Interpreter Needed:  None Criminal Activity/Legal Involvement Pertinent to Current Situation/Hospitalization:  No - Comment as needed Significant Relationships:  Other Family Members Lives with:  Facility Resident Do you feel safe going back to the place where you live?  Yes Need for family participation in patient care:  Yes (Comment)  Care giving concerns:  CSW received consult for possible return to SNF placement at time of discharge. Patient stated she has resided at [facility] and will return at discharge. CSW to continue to follow and assist with discharge planning needs.   Social Worker assessment / plan:  CSW spoke with patient concerning return to SNF.  Employment status:  Disabled (Comment on whether or not currently receiving Disability) Insurance information:  Medicare PT Recommendations:    Information / Referral to community resources:  [place]  Patient/Family's Response to care:  Patient reports agreement with discharge plan and states her family came to visit earlier in the day. She will require PTAR for transport.   Patient/Family's Understanding of and Emotional Response to Diagnosis, Current Treatment, and Prognosis:  Patient/family is realistic regarding therapy needs and expressed being hopeful for return to SNF placement. Patient expressed understanding of CSW role and discharge  process as well as medical condition. She states she has never had pneumonia before. No questions/concerns about plan or treatment.    Emotional Assessment Appearance:  Appears older than stated age Attitude/Demeanor/Rapport:  Unable to Assess Affect (typically observed):  Unable to Assess Orientation:  Oriented to Self, Oriented to Place, Oriented to  Time, Oriented to Situation Alcohol / Substance use:  Not Applicable Psych involvement (Current and /or in the community):  No (Comment)  Discharge Needs  Concerns to be addressed:  Care Coordination Readmission within the last 30 days:  No Current discharge risk:  None Barriers to Discharge:  Continued Medical Work up   [employer], [place] 08/03/2017, 5:08 PM

## 2017-08-03 NOTE — Progress Notes (Signed)
Modified Barium Swallow Progress Note  Patient Details  Name: Deanna Baldwin MRN: [id number] Date of Birth: [birth year]  Today's Date: 08/03/2017  Modified Barium Swallow completed.  Full report located under Chart Review in the Imaging Section.  Brief recommendations include the following:  Clinical Impression  Pt presents with a mild oropharyngeal dysphagia due to positioning, timing, and reduced sensation. She has intermittent premature spillage of thin liquids, particularly with larger boluses, which silently spills into her airway. Thin liquids also appear to enter the laryngeal vestibule posteriorly, which makes me also question the integrity of her vocal fold adduction, particularly in light of her dysphonia. Min cues were provided for a chin tuck, which helped provide better oral containement and therefore better airway protection. No other consistencies entered the airway, clearing the pharynx with good efficiency as well. Of note, attempted to test nectar thick liquids in case pt was not able to consistently use a chin tuck, but she continued to use a chin tuck despite being cued not to do so. Recommend continuing regular diet textures and thin liquids with use of a chin tuck for all liquids. SLP will f/u for tolerance and utilization of precautions.   Swallow Evaluation Recommendations       SLP Diet Recommendations: Regular solids;Thin liquid   Liquid Administration via: Cup;Straw   Medication Administration: Whole meds with puree   Supervision: Full supervision/cueing for compensatory strategies;Staff to assist with self feeding   Compensations: Slow rate;Small sips/bites;Chin tuck;Use straw to facilitate chin tuck   Postural Changes: Seated upright at 90 degrees   Oral Care Recommendations: Oral care BID        [name] 08/03/2017,2:04 PM   [name], M.A. CCC-SLP [phone]

## 2017-08-03 NOTE — NC FL2 (Signed)
[place] MEDICAID FL2 LEVEL OF CARE SCREENING TOOL     IDENTIFICATION  Patient Name: Deanna Baldwin Birthdate: Nov 23, 1975 Sex: female Admission Date (Current Location): 08/01/2017  [facility] and [place] Number:  Herbalist and Address:  [facility] [place], [place] [address] [place], [place] [address]      Provider Number: [id number]  Attending Physician Name and Address:  Albertine Patricia, MD  Relative Name and Phone Number:  Lina/Aunt [phone] & sister Crystal # [phone] Home , Cell [phone]    Current Level of Care: Hospital Recommended Level of Care: [place] Prior Approval Number:    Date Approved/Denied:   PASRR Number: [id number]  Discharge Plan: SNF    Current Diagnoses: Patient Active Problem List   Diagnosis Date Noted  . HCAP (healthcare-associated pneumonia) 08/02/2017  . 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 Sacral osteomyelitis ([place]) 05/19/2017  . Hypothyroidism 04/09/2017  . Hypocalcemia 04/08/2017  . Anemia 04/08/2017  . Osteomyelitis ([place]) 03/19/2017  . Altered mental status   . Dehydration   . Acute osteomyelitis of left pelvic region and thigh ([place])   . SIRS (systemic inflammatory response syndrome) ([place]) 03/17/2017  . Paraplegia ([place]) 03/09/2017  . Chronic constipation 02/13/2017  . Elevated liver enzymes 02/13/2017  . Chronic pain 12/13/2016  . Leukocytosis 09/15/2016  . Hypotension 08/31/2016  . Vitamin B 12 deficiency 07/09/2016  . Pressure ulcer, stage 4 ([place]) 07/08/2016  . Sepsis ([place]) 07/07/2016  . Unintentional weight loss 07/06/2016  . Relapsing remitting multiple sclerosis ([place]) 06/08/2016  . Neurogenic bladder 04/09/2016  . Tachycardia 04/08/2016  . Anemia of chronic disease 04/06/2016  . Dysphagia, oral phase 03/17/2016  . Palliative care by specialist   . Protein-calorie malnutrition, severe ([place])   . Spastic 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 ([place]) 03/02/2016   . UTI (urinary tract infection) 03/02/2016  . Elevated liver function tests 03/02/2016    Orientation RESPIRATION BLADDER Height & Weight     Self  O2(nasal canula 2L) Incontinent Weight: 91 lb (41.3 kg) Height:  5\' 9"  (175.3 cm)(taken from 07/07/17)  BEHAVIORAL SYMPTOMS/MOOD NEUROLOGICAL BOWEL NUTRITION STATUS      Continent Diet(see discharge summary)  AMBULATORY STATUS COMMUNICATION OF NEEDS Skin   Total Care   PU Stage and Appropriate Care(Satge IV, on sacrum and ischial tuberosity)                       Personal Care Assistance Level of Assistance  Total care       Total Care Assistance: Maximum assistance   Functional Limitations Info  Sight, Hearing, Speech Sight Info: Adequate Hearing Info: Adequate Speech Info: Adequate    SPECIAL CARE FACTORS FREQUENCY                       Contractures Contractures Info: Not present    Additional Factors Info  Code Status, Allergies, Isolation Precautions Code Status Info: FULL Allergies Info: NKA     Isolation Precautions Info: MRSA in the nose     Current Medications (08/03/2017):  This is the current hospital active medication list Current Facility-Administered Medications  Medication Dose Route Frequency Provider Last Rate Last Dose  . 0.9 %  sodium chloride infusion (Manually program via Guardrails IV Fluids)   Intravenous Once Kirby-Graham, Karsten Fells, NP      . acetaminophen (TYLENOL) tablet 650 mg  650 mg Oral Q6H PRN Rise Patience, MD   [phone]  mg at 08/02/17 1458   Or  . acetaminophen (TYLENOL) suppository 650 mg  650 mg Rectal Q6H PRN Rise Patience, MD      . bisacodyl (DULCOLAX) EC tablet 5 mg  5 mg Oral Daily Rise Patience, MD   5 mg at 08/02/17 1337  . calcium-vitamin D (OSCAL WITH D) 500-200 MG-UNIT per tablet 2 tablet  2 tablet Oral QID Rise Patience, MD   2 tablet at 08/03/17 0908  . ceFEPIme (MAXIPIME) 1 g in sodium chloride 0.9 % 100 mL IVPB  1 g Intravenous Q24H  Rise Patience, MD 200 mL/hr at 08/02/17 2241 1 g at 08/02/17 2241  . enoxaparin (LOVENOX) injection 20 mg  20 mg Subcutaneous Q24H Florencia Reasons, MD   20 mg at 08/02/17 2246  . famotidine (PEPCID) tablet 20 mg  20 mg Oral Daily Rise Patience, MD   20 mg at 08/03/17 0908  . feeding supplement (ENSURE ENLIVE) (ENSURE ENLIVE) liquid 237 mL  237 mL Oral TID BM Florencia Reasons, MD   237 mL at 08/02/17 1845  . ferrous sulfate tablet 325 mg  325 mg Oral Q breakfast Rise Patience, MD   325 mg at 08/03/17 0910  . guaiFENesin (MUCINEX) 12 hr tablet 600 mg  600 mg Oral BID Rise Patience, MD   600 mg at 08/03/17 1610  . HYDROcodone-acetaminophen (NORCO/VICODIN) 5-325 MG per tablet 1 tablet  1 tablet Oral Q4H PRN Rise Patience, MD      . levothyroxine (SYNTHROID, LEVOTHROID) tablet 50 mcg  50 mcg Oral QAC breakfast Rise Patience, MD   50 mcg at 08/03/17 9604  . magnesium oxide (MAG-OX) tablet 400 mg  400 mg Oral BID Rise Patience, MD   400 mg at 08/03/17 0910  . midodrine (PROAMATINE) tablet 2.5 mg  2.5 mg Oral TID WC Rise Patience, MD   2.5 mg at 08/03/17 1239  . multivitamin with minerals tablet 1 tablet  1 tablet Oral Daily Rise Patience, MD   1 tablet at 08/03/17 5409  . ondansetron (ZOFRAN) tablet 4 mg  4 mg Oral Q6H PRN Rise Patience, MD       Or  . ondansetron [facility]) injection 4 mg  4 mg Intravenous Q6H PRN Rise Patience, MD      . polyethylene glycol (MIRALAX / GLYCOLAX) packet 17 g  17 g Oral Daily Rise Patience, MD   17 g at 08/02/17 1340  . tiZANidine (ZANAFLEX) tablet 2 mg  2 mg Oral TID Rise Patience, MD   2 mg at 08/03/17 0908  . vancomycin (VANCOCIN) IVPB 1000 mg/200 mL premix  1,000 mg Intravenous Q24H Rise Patience, MD 200 mL/hr at 08/02/17 2319 1,000 mg at 08/02/17 2319  . vitamin B-12 (CYANOCOBALAMIN) tablet 100 mcg  100 mcg Oral Daily Rise Patience, MD   100 mcg at 08/03/17 8119  . vitamin C  (ASCORBIC ACID) tablet 500 mg  500 mg Oral Daily Rise Patience, MD   500 mg at 08/03/17 1478     Discharge Medications: Please see discharge summary for a list of discharge medications.  Relevant Imaging Results:  Relevant Lab Results:   Additional Information SSN [id number]  Vinie Sill, LCSWA

## 2017-08-04 LAB — BASIC METABOLIC PANEL
Anion gap: 9 (ref 5–15)
BUN: 20 mg/dL (ref 6–20)
CO2: 24 mmol/L (ref 22–32)
Calcium: 7.8 mg/dL — ABNORMAL LOW (ref 8.9–10.3)
Chloride: 114 mmol/L — ABNORMAL HIGH (ref 98–111)
Creatinine, Ser: 0.78 mg/dL (ref 0.44–1.00)
GFR calc Af Amer: >60 mL/min (ref >60)
GLUCOSE: 116 mg/dL — AB (ref 70–99)
POTASSIUM: 4 mmol/L (ref 3.5–5.1)
Sodium: 147 mmol/L — ABNORMAL HIGH (ref 135–145)

## 2017-08-04 LAB — TYPE AND SCREEN
ABO/RH(D): AB POS
ANTIBODY SCREEN: NEGATIVE
Unit division: 0
Unit division: 0

## 2017-08-04 LAB — CBC
HCT: 26.5 % — ABNORMAL LOW (ref 36.0–46.0)
Hemoglobin: 8.6 g/dL — ABNORMAL LOW (ref 12.0–15.0)
MCH: 29.8 pg (ref 26.0–34.0)
MCHC: 32.5 g/dL (ref 30.0–36.0)
MCV: 91.7 fL (ref 78.0–100.0)
PLATELETS: 342 10*3/uL (ref 150–400)
RBC: 2.89 MIL/uL — AB (ref 3.87–5.11)
RDW: 19.3 % — ABNORMAL HIGH (ref 11.5–15.5)
WBC: 7.3 10*3/uL (ref 4.0–10.5)

## 2017-08-04 LAB — BPAM RBC
Blood Product Expiration Date: 201907042359
Blood Product Expiration Date: 201907042359
ISSUE DATE / TIME: 201907031451
ISSUE DATE / TIME: 201907030822
Unit Type and Rh: 7300
Unit Type and Rh: 7300

## 2017-08-04 LAB — VANCOMYCIN, TROUGH: Vancomycin Tr: 17 ug/mL (ref 15–20)

## 2017-08-04 MED ORDER — CHLORHEXIDINE GLUCONATE CLOTH 2 % EX PADS
6.0000 | MEDICATED_PAD | Freq: Every day | CUTANEOUS | Status: AC
  Administered 2017-08-07 – 2017-08-09 (×2): 6 via TOPICAL

## 2017-08-04 MED ORDER — MUPIROCIN 2 % EX OINT
1.0000 "application " | TOPICAL_OINTMENT | Freq: Two times a day (BID) | CUTANEOUS | Status: AC
  Administered 2017-08-04 – 2017-08-08 (×10): 1 via NASAL
  Filled 2017-08-04: qty 22
  Filled 2017-08-04: qty 44

## 2017-08-04 MED ORDER — SODIUM CHLORIDE 0.45 % IV SOLN
INTRAVENOUS | Status: DC
  Administered 2017-08-04 – 2017-08-07 (×4): via INTRAVENOUS

## 2017-08-04 NOTE — Progress Notes (Signed)
PROGRESS NOTE                                                                                                                                                                                                             Patient Demographics:    Deanna Baldwin, is a 42 y.o. female, DOB - 1975/07/12, LDJ:[id number]  Admit date - 08/01/2017   Admitting Physician Rise Patience, MD  Outpatient Primary MD for the patient is [name], DO  LOS - 2   Chief Complaint  Patient presents with  . Shortness of Breath       Brief Narrative   42 y.o. female with history of multiple sclerosis with paraplegia, large sacral decubitus ulcer, sacral osteomyelitis chronic anemia, hypothyroidism was expressing cough with shortness of breath and productive sputum last 2 days at her living facility.  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 -Still remains with no growth to date  Sacral decubitus ulcer -Continue with wound care  Anemia of chronic illness -With anemia with significant hemoglobin drop from 7.1-5.9, this appears to be delusional as all cell lines has been dropped, she was transfused 2 units PRBC with good response, hemoglobin today is 8.6 . -She is with anemia work-up in the past, was significant for  anemia of chronic disease, folic acid, Q30, ferritin within normal limits, only low iron . -Call still pending, but patient had an episode of normal color stool yesterday . -Is with a recent hospitalization in March 2019, required 2 units PRBC transfusion  Hypothyroidism -New with Synthroid  Multiple sclerosis -She is following with neurology as an outpatient  Chronic hypotension - continue with midodrine  Spastic paraplegia -Continue with  home regimen including Zanaflex and baclofen  Hyponatremia -Patient to increase her fluid intake, will start on low-rate half-normal saline     Code Status : Full  Family Communication  : none at bedside  Disposition Plan  : back to SNF when stable  Consults  :  ID  Procedures  : none  DVT Prophylaxis  :  lovenox  Lab Results  Component Value Date   PLT 342 08/04/2017    Antibiotics  :    Anti-infectives (From admission, onward)   Start     Dose/Rate Route Frequency Ordered Stop   08/03/17 0000  vancomycin (VANCOCIN) IVPB 1000 mg/200 mL premix     1,000 mg 200 mL/hr over 60 Minutes Intravenous Every 24 hours 08/02/17 0012     08/02/17 2200  ceFEPIme (MAXIPIME) 1 g in sodium chloride 0.9 % 100 mL IVPB     1 g 200 mL/hr over 30 Minutes Intravenous Every 24 hours 08/02/17 1745     08/02/17 0830  piperacillin-tazobactam (ZOSYN) IVPB 3.375 g  Status:  Discontinued     3.375 g 12.5 mL/hr over 240 Minutes Intravenous Every 8 hours 08/02/17 0801 08/02/17 1716   08/02/17 0800  ceFEPIme (MAXIPIME) 1 g in sodium chloride 0.9 % 100 mL IVPB  Status:  Discontinued     1 g 200 mL/hr over 30 Minutes Intravenous Every 8 hours 08/02/17 0012 08/02/17 0744   08/02/17 0000  ceFEPIme (MAXIPIME) 2 g in sodium chloride 0.9 % 100 mL IVPB     2 g 200 mL/hr over 30 Minutes Intravenous  Once 08/01/17 2352 08/02/17 0136   08/02/17 0000  vancomycin (VANCOCIN) IVPB 1000 mg/200 mL premix     1,000 mg 200 mL/hr over 60 Minutes Intravenous  Once 08/01/17  2352 08/02/17 0206        Objective:   Vitals:   08/03/17 1501 08/03/17 1524 08/03/17 1909 08/04/17 0753  BP: 98/61 104/84 106/75   Pulse: 85  86   Resp: (!) 25  (!) 28   Temp: 98.1 F (36.7 C) 97.7 F (36.5 C) 97.9 F (36.6 C) 98.6 F (37 C)  TempSrc: Oral Oral  Oral  SpO2: 93%  97%   Weight:      Height:        Wt Readings from Last 3 Encounters:  08/01/17 41.3 kg (91 lb)  07/07/17 40.9 kg (90 lb 3.2 oz)  05/09/17 41.8 kg (92 lb 3.2 oz)     Intake/Output Summary (Last 24 hours) at 08/04/2017 1346 Last data filed at 08/04/2017 1346 Gross per 24 hour  Intake 4338.94 ml  Output 1075 ml  Net 3263.94 ml     Physical Exam   Awake Alert, Oriented X 3, No new F.N deficits, Normal affect Symmetrical Chest wall movement, Good air movement bilaterally, CTAB RRR,No Gallops,Rubs or new Murmurs, No Parasternal Heave +ve B.Sounds, Abd Soft, No tenderness, No organomegaly appriciated, No rebound - guarding or rigidity. Sacral decubitus ulcer, contracted lower extremities    Data Review:    CBC Recent Labs  Lab 08/01/17 2347 08/02/17 0652 08/03/17 0427 08/03/17 2100 08/04/17 0342  WBC 12.5* 11.0* 8.0 9.4 7.3  HGB 8.0* 7.1* 5.9* 9.4* 8.6*  HCT 26.4* 23.3* 19.4* 29.2* 26.5*  PLT 487* 422* 353 422* 342  MCV 103.5* 106.4* 104.3* 91.8 91.7  MCH 31.4 32.4 31.7 29.6 29.8  MCHC 30.3 30.5 30.4 32.2 32.5  RDW 12.0 12.2 12.2 18.3* 19.3*  LYMPHSABS 0.8  --   --   --   --  MONOABS 0.8  --   --   --   --   EOSABS 0.1  --   --   --   --   BASOSABS 0.0  --   --   --   --     Chemistries  Recent Labs  Lab 08/01/17 2347 08/02/17 0652 08/02/17 0731 08/03/17 0427 08/04/17 0342  NA 141  --  145 144 147*  K 6.2*  --  4.5 4.1 4.0  CL 103  --  109 113* 114*  CO2 22  --  23 22 24   GLUCOSE 137*  --  136* 101* 116*  BUN 45*  --  36* 25* 20  CREATININE 1.87* 1.49* 1.39* 1.00 0.78  CALCIUM 7.3*  --  7.1* 6.6* 7.8*  AST 174*  --   --   --   --   ALT 87*  --   --   --   --     ALKPHOS 627*  --   --   --   --   BILITOT 1.8*  --   --   --   --    ------------------------------------------------------------------------------------------------------------------ No results for input(s): CHOL, HDL, LDLCALC, TRIG, CHOLHDL, LDLDIRECT in the last 72 hours.  Lab Results  Component Value Date   HGBA1C 5.5 06/24/2016   ------------------------------------------------------------------------------------------------------------------ No results for input(s): TSH, T4TOTAL, T3FREE, THYROIDAB in the last 72 hours.  Invalid input(s): FREET3 ------------------------------------------------------------------------------------------------------------------ No results for input(s): VITAMINB12, FOLATE, FERRITIN, TIBC, IRON, RETICCTPCT in the last 72 hours.  Coagulation profile Recent Labs  Lab 08/02/17 0002 08/02/17 0652  INR 1.21 1.35    No results for input(s): DDIMER in the last 72 hours.  Cardiac Enzymes No results for input(s): CKMB, TROPONINI, MYOGLOBIN in the last 168 hours.  Invalid input(s): CK ------------------------------------------------------------------------------------------------------------------ No results found for: BNP  Inpatient Medications  Scheduled Meds: . sodium chloride   Intravenous Once  . bisacodyl  5 mg Oral Daily  . calcium-vitamin D  2 tablet Oral QID  . Chlorhexidine Gluconate Cloth  6 each Topical Q0600  . enoxaparin  20 mg Subcutaneous Q24H  . famotidine  20 mg Oral Daily  . feeding supplement (ENSURE ENLIVE)  237 mL Oral TID BM  . ferrous sulfate  325 mg Oral Q breakfast  . guaiFENesin  600 mg Oral BID  . levothyroxine  50 mcg Oral QAC breakfast  . magnesium oxide  400 mg Oral BID  . midodrine  2.5 mg Oral TID WC  . multivitamin with minerals  1 tablet Oral Daily  . mupirocin ointment  1 application Nasal BID  . polyethylene glycol  17 g Oral Daily  . tiZANidine  2 mg Oral TID  . vitamin B-12  100 mcg Oral Daily  .  vitamin C  500 mg Oral Daily   Continuous Infusions: . sodium chloride    . ceFEPime (MAXIPIME) IV 1 g (08/03/17 2150)  . vancomycin Stopped (08/04/17 0205)   PRN Meds:.acetaminophen **OR** acetaminophen, HYDROcodone-acetaminophen, ondansetron **OR** ondansetron (ZOFRAN) IV  Micro Results Recent Results (from the past 240 hour(s))  Urine culture     Status: Abnormal   Collection Time: 08/02/17 12:01 AM  Result Value Ref Range Status   Specimen Description URINE, CATHETERIZED  Final   Special Requests   Final    Immunocompromised Performed at [facility], [place] [address] [place], [place] [address]    Culture MULTIPLE SPECIES PRESENT, SUGGEST RECOLLECTION (A)  Final   Report Status 08/03/2017 FINAL  Final  Culture, blood (Routine x 2)     Status: None (Preliminary result)   Collection Time: 08/02/17 12:02 AM  Result Value Ref Range Status   Specimen Description SITE NOT SPECIFIED  Final   Special Requests   Final    BOTTLES DRAWN AEROBIC AND ANAEROBIC Blood Culture results may not be optimal due to an inadequate volume of blood received in culture bottles   Culture   Final    NO GROWTH 1 DAY Performed at [facility], [place] [address] [place], [place] [address]    Report Status PENDING  Incomplete  Culture, blood (Routine x 2)     Status: None (Preliminary result)   Collection Time: 08/02/17 12:03 AM  Result Value Ref Range Status   Specimen Description BLOOD LEFT WRIST  Final   Special Requests   Final    BOTTLES DRAWN AEROBIC AND ANAEROBIC Blood Culture results may not be optimal due to an excessive volume of blood received in culture bottles   Culture   Final    NO GROWTH 1 DAY Performed at [facility], [place] [address], [employer] [place], [place] [address]    Report Status PENDING  Incomplete  MRSA PCR Screening     Status: Abnormal   Collection Time: 08/02/17  7:44 AM  Result Value Ref Range Status   MRSA by PCR POSITIVE (A) NEGATIVE Final    Comment:          The GeneXpert MRSA Assay (FDA approved for NASAL specimens only), is one component of a comprehensive MRSA colonization surveillance program. It is not intended to diagnose MRSA infection nor to guide or monitor treatment for MRSA infections. RESULT CALLED TO, READ BACK BY AND VERIFIED WITH: Berlinda Last RN 12:10 08/02/08 ([email or web address]) Performed at [facility], [place] [address] [place], [place] [address]     Radiology Reports Dg Chest 2 View  Result Date: 08/02/2017 CLINICAL DATA:  42 year old female with fever. EXAM: CHEST - 2 VIEW COMPARISON:  Chest radiograph dated 03/17/2017 FINDINGS: There is a large left pleural effusion with consolidative changes of the majority of the left lung. Only a small aerated portion of the long noted in the left upper lobe. There is associated volume loss and shift of the mediastinum into the left hemithorax. The right lung is clear. No pneumothorax. No acute osseous pathology. IMPRESSION: Large left pleural effusion with opacification of the left hemithorax as described. CT may provide better evaluation of the size of the pleural effusion and assessment if thoracentesis is clinically considered. Electronically Signed   By: Anner Crete M.D.   On: 08/02/2017 01:23   Ct Chest W Contrast  Result Date: 08/02/2017 CLINICAL DATA:  Recent pneumonia, cough EXAM: CT CHEST WITH CONTRAST TECHNIQUE: Multidetector CT imaging of the chest was performed during intravenous contrast administration. CONTRAST:  108mL OMNIPAQUE IOHEXOL 300 MG/ML  SOLN COMPARISON:  08/02/2017 radiograph, CT chest 08/31/2016 FINDINGS: Cardiovascular: Nonaneurysmal aorta. Normal heart size. No significant pericardial effusion. Mediastinum/Nodes: Midline trachea. No thyroid mass. Borderline mediastinal lymph node, left paratracheal lymph node measures 9 mm. Lungs/Pleura: Small left-sided pleural effusion. Extensive consolidation within the left lower lobe with partial consolidation in the left upper  lobe and ground-glass density. No pneumothorax. Similar appearance of volume loss on the left. Upper Abdomen: 5 mm probable stone in the mid right kidney. Musculoskeletal: No chest wall abnormality. No acute or significant osseous findings. IMPRESSION: 1. Small left-sided pleural effusion. Extensive left lower lobe and left upper  lobe consolidative process, most consistent with a pneumonia. 2. Nonobstructing stone within the right kidney. Electronically Signed   By: Donavan Foil M.D.   On: 08/02/2017 02:42      Phillips Climes M.D on 08/04/2017 at 1:46 PM  Between 7am to 7pm - Pager - [phone]  After 7pm go to [email or web address] - password [email or web address]  [facility] -  Office  [phone]

## 2017-08-04 NOTE — Progress Notes (Signed)
  Speech Language Pathology Treatment: Dysphagia  Patient Details Name: Deanna Baldwin MRN: [id number] DOB: 1975-09-24 Today's Date: 08/04/2017 Time: 2423-5361 SLP Time Calculation (min) (ACUTE ONLY): 16 min  Assessment / Plan / Recommendation Clinical Impression  Pt was reclined upon SLP arrival, having just finished her fruit cup and starting her grits off her breakfast tray. SLP provided assistance with repositioning to increase safety, with pt tolerating a much more upright position although still not quite at 90 degrees. She recalled the need for a chin tuck with Mod I, but she declined thin liquids this morning so SLP could not assess her implementation. She consumed bites of puree only, with delayed cough noted x2 under SLP supervision: once prior to repositioning, and only once more while upright. Pt did have functional airway protection with purees on MBS though. Recommend continuing current diet and precautions with education reinforced about the rationale and importance of adhering to precautions. Will continue to follow to assess for tolerance.    HPI HPI: 42 year old female admitted 08/01/17 with sepsis. PMH: MS with spastic paraplegia, PNA, silent aspiration. CXR = LLL/LUL consolidative process c/w PNA, left pleural effusion       SLP Plan  Continue with current plan of care       Recommendations  Diet recommendations: Regular;Thin liquid Liquids provided via: Straw Medication Administration: Whole meds with puree Supervision: Patient able to self feed;Full supervision/cueing for compensatory strategies Compensations: Slow rate;Small sips/bites;Chin tuck;Use straw to facilitate chin tuck Postural Changes and/or Swallow Maneuvers: Seated upright 90 degrees                Oral Care Recommendations: Oral care BID Follow up Recommendations: Skilled Nursing facility SLP Visit Diagnosis: Dysphagia, oropharyngeal phase (R13.12) Plan: Continue with current plan of care        GO                Deanna Baldwin 08/04/2017, 9:55 AM  Deanna Baldwin, M.A. CCC-SLP [phone]

## 2017-08-04 NOTE — Progress Notes (Signed)
[facility] for Infectious Disease  Date of Admission:  08/01/2017     Total days of antibiotics 3         ASSESSMENT/PLAN  Deanna Baldwin is a 43 y/o female with previous medical history of MS with paraplegia, sacral ulcer, urostomy and sacral osteomyelitis admitted from SNF and found to have HCAP.   Deanna Baldwin is on Day 4 of empiric antimicrobial therapy with vancomycin and cefepime. Tolerating antibiotics with no adverse side effects. No evidence of vancomycin nephrotoxicity. She has been afebrile for about the last 36 hours and appears stable currently. Goal will be a total of 7 days of antibiotic therapy. Also note continued anemia with drop in hemoglobin following blood transfusion yesterday. No obvious signs of bleeding on exam.  Disposition plan is for return to SNF.   1. Continue current dosage of cefepime and vancomycin.  2. Anemia plan per primary team.    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 sodium chloride   Intravenous Once  . bisacodyl  5 mg Oral Daily  . calcium-vitamin D  2 tablet Oral QID  . Chlorhexidine Gluconate Cloth  6 each Topical Q0600  . enoxaparin  20 mg Subcutaneous Q24H  . famotidine  20 mg Oral Daily  . feeding supplement (ENSURE ENLIVE)  237 mL Oral TID BM  . ferrous sulfate  325 mg Oral Q breakfast  . guaiFENesin  600 mg Oral BID  . levothyroxine  50 mcg Oral QAC breakfast  . magnesium oxide  400 mg Oral BID  . midodrine  2.5 mg Oral TID WC  . multivitamin with minerals  1 tablet Oral Daily  . mupirocin ointment  1 application Nasal BID  . polyethylene glycol  17 g Oral Daily  . tiZANidine  2 mg Oral TID  . vitamin B-12  100 mcg Oral Daily  . vitamin C  500 mg Oral Daily    SUBJECTIVE:  Afebrile overnight. Lab work shows WBC count of 7.3  and creatinine of 0.78. Hemoglobin continued to drop post transfusion from 9.4 to 7.3. Denies fevers. Continues to feel congestion in her chest.   No Known Allergies   Review of Systems: Review of Systems  Constitutional: Negative for chills and fever.  Respiratory: Negative for cough, sputum production, shortness of breath and wheezing.   Cardiovascular: Negative for chest pain and leg swelling.  Gastrointestinal: Negative for abdominal pain, constipation, diarrhea, nausea and vomiting.      OBJECTIVE: Vitals:   08/03/17 1501 08/03/17 1524 08/03/17 1909 08/04/17 0753  BP: 98/61 104/84 106/75   Pulse: 85  86   Resp: (!) 25  (!) 28   Temp: 98.1 F (36.7 C) 97.7 F (36.5 C) 97.9 F (36.6 C) 98.6 F (37 C)  TempSrc: Oral Oral  Oral  SpO2: 93%  97%   Weight:      Height:       Body mass index is 13.44 kg/m.  Physical Exam  Constitutional: She is oriented to person, place, and time. She appears well-developed and well-nourished. She appears cachectic. She is cooperative. She appears ill. No distress.  Cardiovascular: Normal rate, regular rhythm, normal heart sounds and intact distal pulses. Exam reveals no gallop and no friction rub.  No murmur heard. Pulmonary/Chest: Effort normal. No respiratory distress.  She has no wheezes. She has rhonchi in the left upper field and the left middle field. She exhibits no tenderness.  Neurological: She is alert and oriented to person, place, and time.  Skin: Skin is warm and dry.  Psychiatric: She has a normal mood and affect.    Lab Results Lab Results  Component Value Date   WBC 7.3 08/04/2017   HGB 8.6 (L) 08/04/2017   HCT 26.5 (L) 08/04/2017   MCV 91.7 08/04/2017   PLT 342 08/04/2017    Lab Results  Component Value Date   CREATININE 0.78 08/04/2017   BUN 20 08/04/2017   NA 147 (H) 08/04/2017   K 4.0 08/04/2017   CL 114 (H) 08/04/2017   CO2 24 08/04/2017    Lab Results  Component Value Date   ALT 87 (H) 08/01/2017    AST 174 (H) 08/01/2017   ALKPHOS 627 (H) 08/01/2017   BILITOT 1.8 (H) 08/01/2017     Microbiology: Recent Results (from the past 240 hour(s))  Urine culture     Status: Abnormal   Collection Time: 08/02/17 12:01 AM  Result Value Ref Range Status   Specimen Description URINE, CATHETERIZED  Final   Special Requests   Final    Immunocompromised Performed at [facility], [place] [address], [place], [place] [address]    Culture MULTIPLE SPECIES PRESENT, SUGGEST RECOLLECTION (A)  Final   Report Status 08/03/2017 FINAL  Final  Culture, blood (Routine x 2)     Status: None (Preliminary result)   Collection Time: 08/02/17 12:02 AM  Result Value Ref Range Status   Specimen Description SITE NOT SPECIFIED  Final   Special Requests   Final    BOTTLES DRAWN AEROBIC AND ANAEROBIC Blood Culture results may not be optimal due to an inadequate volume of blood received in culture bottles   Culture   Final    NO GROWTH 1 DAY Performed at [facility], [place] [address] [place], [place] [address]    Report Status PENDING  Incomplete  Culture, blood (Routine x 2)     Status: None (Preliminary result)   Collection Time: 08/02/17 12:03 AM  Result Value Ref Range Status   Specimen Description BLOOD LEFT WRIST  Final   Special Requests   Final    BOTTLES DRAWN AEROBIC AND ANAEROBIC Blood Culture results may not be optimal due to an excessive volume of blood received in culture bottles   Culture   Final    NO GROWTH 1 DAY Performed at [facility], [place] [address], [place], [place] [address]    Report Status PENDING  Incomplete  MRSA PCR Screening     Status: Abnormal   Collection Time: 08/02/17  7:44 AM  Result Value Ref Range Status   MRSA by PCR POSITIVE (A) NEGATIVE Final    Comment:        The GeneXpert MRSA Assay (FDA approved for NASAL specimens only), is one component of a comprehensive MRSA colonization surveillance program. It is not intended to diagnose MRSA infection  nor to guide or monitor treatment for MRSA infections. RESULT CALLED TO, READ BACK BY AND VERIFIED WITH: Berlinda Last RN 12:10 08/02/08 ([email or web address]) Performed at [facility], [place] [address] [place], [place] [address]      Terri Piedra, [place] [phone] Pager  08/04/2017  12:21 PM

## 2017-08-04 NOTE — Progress Notes (Signed)
Pharmacy Antibiotic Note  Deanna Baldwin is a 42 y.o. female admitted on 08/01/2017 with pneumonia.  Pharmacy has been consulted for Vancomycin dosing. WBC trending down to WNL. Renal function has normalized  Vancomycin level tonight is 17, no really a true trough as it was drawn prior to the 3rd dose  Plan: Cont vancomycin 1000 mg IV q24h for now0 Re-check level in 48 hours  Height: 5\' 9"  (175.3 cm)(taken from 07/07/17) Weight: 91 lb (41.3 kg) IBW/kg (Calculated) : 66.2  Temp (24hrs), Avg:98.5 F (36.9 C), Min:98.3 F (36.8 C), Max:98.7 F (37.1 C)  Recent Labs  Lab 08/01/17 2347  08/02/17 0243 08/02/17 1975 08/02/17 0731 08/02/17 0804 08/02/17 1519 08/03/17 0427 08/03/17 2100 08/04/17 0342 08/04/17 2258  WBC 12.5*  --   --  11.0*  --   --   --  8.0 9.4 7.3  --   CREATININE 1.87*  --   --  1.49* 1.39*  --   --  1.00  --  0.78  --   LATICACIDVEN  --    < > 2.47* 3.7*  --  2.5* 2.6* 1.4  --   --   --   VANCOTROUGH  --   --   --   --   --   --   --   --   --   --  17   < > = values in this interval not displayed.    Estimated Creatinine Clearance: 60.3 mL/min (by C-G formula based on SCr of 0.78 mg/dL).    No Known Allergies   [name] 08/04/2017 11:48 PM

## 2017-08-05 LAB — BASIC METABOLIC PANEL
Anion gap: 8 (ref 5–15)
BUN: 21 mg/dL — AB (ref 6–20)
CO2: 24 mmol/L (ref 22–32)
CREATININE: 0.76 mg/dL (ref 0.44–1.00)
Calcium: 7.9 mg/dL — ABNORMAL LOW (ref 8.9–10.3)
Chloride: 112 mmol/L — ABNORMAL HIGH (ref 98–111)
GFR calc Af Amer: >60 mL/min (ref >60)
GLUCOSE: 75 mg/dL (ref 70–99)
Potassium: 4.4 mmol/L (ref 3.5–5.1)
SODIUM: 144 mmol/L (ref 135–145)

## 2017-08-05 LAB — CBC
HCT: 29.1 % — ABNORMAL LOW (ref 36.0–46.0)
Hemoglobin: 9.4 g/dL — ABNORMAL LOW (ref 12.0–15.0)
MCH: 29.6 pg (ref 26.0–34.0)
MCHC: 32.3 g/dL (ref 30.0–36.0)
MCV: 91.5 fL (ref 78.0–100.0)
PLATELETS: 440 10*3/uL — AB (ref 150–400)
RBC: 3.18 MIL/uL — AB (ref 3.87–5.11)
RDW: 18.6 % — AB (ref 11.5–15.5)
WBC: 7.8 10*3/uL (ref 4.0–10.5)

## 2017-08-05 NOTE — Plan of Care (Signed)
Pt to increase nutritional intake and to remain free of falls.

## 2017-08-05 NOTE — Progress Notes (Signed)
PROGRESS NOTE                                                                                                                                                                                                             Patient Demographics:    Deanna Baldwin, is a 42 y.o. female, DOB - 1975/12/10, CBS:[id number]  Admit date - 08/01/2017   Admitting Physician Rise Patience, MD  Outpatient Primary MD for the patient is [name], DO  LOS - 3   Chief Complaint  Patient presents with  . Shortness of Breath       Brief Narrative   42 y.o. female with history of multiple sclerosis with paraplegia, large sacral decubitus ulcer, sacral osteomyelitis chronic anemia, hypothyroidism was expressing cough with shortness of breath and productive sputum last 2 days at her living facility.  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  Total of 7 days of IV antibiotic treatment, last dose will be this Sunday -blood cultures remains with no growth to date  Sacral decubitus ulcer -Continue with wound care  Anemia of chronic illness -With anemia with significant hemoglobin drop from 7.1-5.9, this appears to be delusional as all cell lines has been dropped, she was transfused 2 units PRBC with good response,  hemoglobin today is 8.6 . -She is with anemia work-up in the past, was significant for anemia of chronic disease, folic acid, W46, ferritin within normal limits, only low iron . -Call still pending, but patient had an episode of normal color stool yesterday . -Is with a recent hospitalization in March 2019, required 2 units PRBC transfusion  Hypothyroidism -New with Synthroid  Multiple sclerosis -She is following with  neurology as an outpatient  Chronic hypotension - continue with midodrine  Spastic paraplegia -Continue with home regimen including Zanaflex and baclofen  Hyponatremia -Patient to increase her fluid intake, will start on low-rate half-normal saline     Code Status : Full  Family Communication  : none at bedside  Disposition Plan  : back to SNF when stable  Consults  :  ID  Procedures  : none  DVT Prophylaxis  :  lovenox  Lab Results  Component Value Date   PLT 440 (H) 08/05/2017    Antibiotics  :    Anti-infectives (From admission, onward)   Start     Dose/Rate Route Frequency Ordered Stop   08/03/17 0000  vancomycin (VANCOCIN) IVPB 1000 mg/200 mL premix     1,000 mg 200 mL/hr over 60 Minutes Intravenous Every 24 hours 08/02/17 0012 08/08/17 2359   08/02/17 2200  ceFEPIme (MAXIPIME) 1 g in sodium chloride 0.9 % 100 mL IVPB     1 g 200 mL/hr over 30 Minutes Intravenous Every 24 hours 08/02/17 1745 08/08/17 2359   08/02/17 0830  piperacillin-tazobactam (ZOSYN) IVPB 3.375 g  Status:  Discontinued     3.375 g 12.5 mL/hr over 240 Minutes Intravenous Every 8 hours 08/02/17 0801 08/02/17 1716   08/02/17 0800  ceFEPIme (MAXIPIME) 1 g in sodium chloride 0.9 % 100 mL IVPB  Status:  Discontinued     1 g 200 mL/hr over 30 Minutes Intravenous Every 8 hours 08/02/17 0012 08/02/17 0744   08/02/17 0000  ceFEPIme (MAXIPIME) 2 g in sodium chloride 0.9 % 100 mL IVPB     2 g 200 mL/hr over 30 Minutes Intravenous  Once 08/01/17 2352 08/02/17 0136   08/02/17 0000   vancomycin (VANCOCIN) IVPB 1000 mg/200 mL premix     1,000 mg 200 mL/hr over 60 Minutes Intravenous  Once 08/01/17 2352 08/02/17 0206        Objective:   Vitals:   08/05/17 0622 08/05/17 0738 08/05/17 1230 08/05/17 1314  BP: (!) 95/58  (!) 87/53 (!) 91/56  Pulse:      Resp:  20 20   Temp:  98.3 F (36.8 C) 98.6 F (37 C)   TempSrc:  Oral Oral   SpO2:      Weight:      Height:        Wt Readings from Last 3 Encounters:  08/05/17 44.5 kg (98 lb)  07/07/17 40.9 kg (90 lb 3.2 oz)  05/09/17 41.8 kg (92 lb 3.2 oz)     Intake/Output Summary (Last 24 hours) at 08/05/2017 1551 Last data filed at 08/05/2017 1340 Gross per 24 hour  Intake 2817.84 ml  Output 1150 ml  Net 1667.84 ml     Physical Exam   Awake Alert, Oriented X 3, in no apparent distress Symmetrical Chest wall movement, Good air movement bilaterally, CTAB RRR,No Gallops,Rubs or new Murmurs, No Parasternal Heave +ve B.Sounds, Abd Soft, No rebound - guarding or rigidity. Sacral decubitus ulcer, contracted lower extremities    Data Review:    CBC Recent Labs  Lab 08/01/17 2347 08/02/17 6440 08/03/17 0427 08/03/17 2100 08/04/17 0342 08/05/17 0627  WBC 12.5* 11.0* 8.0 9.4 7.3 7.8  HGB 8.0* 7.1* 5.9* 9.4* 8.6* 9.4*  HCT 26.4* 23.3* 19.4* 29.2* 26.5* 29.1*  PLT 487* 422* 353 422* 342 440*  MCV 103.5* 106.4* 104.3* 91.8 91.7 91.5  MCH 31.4 32.4 31.7 29.6 29.8 29.6  MCHC 30.3 30.5 30.4 32.2 32.5 32.3  RDW  12.0 12.2 12.2 18.3* 19.3* 18.6*  LYMPHSABS 0.8  --   --   --   --   --   MONOABS 0.8  --   --   --   --   --   EOSABS 0.1  --   --   --   --   --   BASOSABS 0.0  --   --   --   --   --     Chemistries  Recent Labs  Lab 08/01/17 2347 08/02/17 0652 08/02/17 0731 08/03/17 0427 08/04/17 0342 08/05/17 0627  NA 141  --  145 144 147* 144  K 6.2*  --  4.5 4.1 4.0 4.4  CL 103  --  109 113* 114* 112*  CO2 22  --  23 22 24 24   GLUCOSE 137*  --  136* 101* 116* 75  BUN 45*  --  36* 25* 20 21*    CREATININE 1.87* 1.49* 1.39* 1.00 0.78 0.76  CALCIUM 7.3*  --  7.1* 6.6* 7.8* 7.9*  AST 174*  --   --   --   --   --   ALT 87*  --   --   --   --   --   ALKPHOS 627*  --   --   --   --   --   BILITOT 1.8*  --   --   --   --   --    ------------------------------------------------------------------------------------------------------------------ No results for input(s): CHOL, HDL, LDLCALC, TRIG, CHOLHDL, LDLDIRECT in the last 72 hours.  Lab Results  Component Value Date   HGBA1C 5.5 06/24/2016   ------------------------------------------------------------------------------------------------------------------ No results for input(s): TSH, T4TOTAL, T3FREE, THYROIDAB in the last 72 hours.  Invalid input(s): FREET3 ------------------------------------------------------------------------------------------------------------------ No results for input(s): VITAMINB12, FOLATE, FERRITIN, TIBC, IRON, RETICCTPCT in the last 72 hours.  Coagulation profile Recent Labs  Lab 08/02/17 0002 08/02/17 0652  INR 1.21 1.35    No results for input(s): DDIMER in the last 72 hours.  Cardiac Enzymes No results for input(s): CKMB, TROPONINI, MYOGLOBIN in the last 168 hours.  Invalid input(s): CK ------------------------------------------------------------------------------------------------------------------ No results found for: BNP  Inpatient Medications  Scheduled Meds: . sodium chloride   Intravenous Once  . bisacodyl  5 mg Oral Daily  . calcium-vitamin D  2 tablet Oral QID  . Chlorhexidine Gluconate Cloth  6 each Topical Q0600  . enoxaparin  20 mg Subcutaneous Q24H  . famotidine  20 mg Oral Daily  . feeding supplement (ENSURE ENLIVE)  237 mL Oral TID BM  . ferrous sulfate  325 mg Oral Q breakfast  . guaiFENesin  600 mg Oral BID  . levothyroxine  50 mcg Oral QAC breakfast  . magnesium oxide  400 mg Oral BID  . midodrine  2.5 mg Oral TID WC  . multivitamin with minerals  1 tablet Oral  Daily  . mupirocin ointment  1 application Nasal BID  . polyethylene glycol  17 g Oral Daily  . tiZANidine  2 mg Oral TID  . vitamin B-12  100 mcg Oral Daily  . vitamin C  500 mg Oral Daily   Continuous Infusions: . sodium chloride 50 mL/hr at 08/05/17 1340  . ceFEPime (MAXIPIME) IV Stopped (08/04/17 2239)  . vancomycin 200 mL/hr at 08/05/17 0100   PRN Meds:.acetaminophen **OR** acetaminophen, HYDROcodone-acetaminophen, ondansetron **OR** ondansetron (ZOFRAN) IV  Micro Results Recent Results (from the past 240 hour(s))  Urine culture     Status: Abnormal   Collection  Time: 08/02/17 12:01 AM  Result Value Ref Range Status   Specimen Description URINE, CATHETERIZED  Final   Special Requests   Final    Immunocompromised Performed at [facility], [address] [place], [place] [address]    Culture MULTIPLE SPECIES PRESENT, SUGGEST RECOLLECTION (A)  Final   Report Status 08/03/2017 FINAL  Final  Culture, blood (Routine x 2)     Status: None (Preliminary result)   Collection Time: 08/02/17 12:02 AM  Result Value Ref Range Status   Specimen Description SITE NOT SPECIFIED  Final   Special Requests   Final    BOTTLES DRAWN AEROBIC AND ANAEROBIC Blood Culture results may not be optimal due to an inadequate volume of blood received in culture bottles   Culture   Final    NO GROWTH 3 DAYS Performed at [facility], [place] [address] [place], [place] [address]    Report Status PENDING  Incomplete  Culture, blood (Routine x 2)     Status: None (Preliminary result)   Collection Time: 08/02/17 12:03 AM  Result Value Ref Range Status   Specimen Description BLOOD LEFT WRIST  Final   Special Requests   Final    BOTTLES DRAWN AEROBIC AND ANAEROBIC Blood Culture results may not be optimal due to an excessive volume of blood received in culture bottles   Culture   Final    NO GROWTH 3 DAYS Performed at [facility], [place] [address] [place], [place] [address]    Report  Status PENDING  Incomplete  MRSA PCR Screening     Status: Abnormal   Collection Time: 08/02/17  7:44 AM  Result Value Ref Range Status   MRSA by PCR POSITIVE (A) NEGATIVE Final    Comment:        The GeneXpert MRSA Assay (FDA approved for NASAL specimens only), is one component of a comprehensive MRSA colonization surveillance program. It is not intended to diagnose MRSA infection nor to guide or monitor treatment for MRSA infections. RESULT CALLED TO, READ BACK BY AND VERIFIED WITH: Berlinda Last RN 12:10 08/02/08 ([email or web address]) Performed at [facility], [place] [address] [place], [place] [address]     Radiology Reports Dg Chest 2 View  Result Date: 08/02/2017 CLINICAL DATA:  42 year old female with fever. EXAM: CHEST - 2 VIEW COMPARISON:  Chest radiograph dated 03/17/2017 FINDINGS: There is a large left pleural effusion with consolidative changes of the majority of the left lung. Only a small aerated portion of the long noted in the left upper lobe. There is associated volume loss and shift of the mediastinum into the left hemithorax. The right lung is clear. No pneumothorax. No acute osseous pathology. IMPRESSION: Large left pleural effusion with opacification of the left hemithorax as described. CT may provide better evaluation of the size of the pleural effusion and assessment if thoracentesis is clinically considered. Electronically Signed   By: Anner Crete M.D.   On: 08/02/2017 01:23   Ct Chest W Contrast  Result Date: 08/02/2017 CLINICAL DATA:  Recent pneumonia, cough EXAM: CT CHEST WITH CONTRAST TECHNIQUE: Multidetector CT imaging of the chest was performed during intravenous contrast administration. CONTRAST:  46mL OMNIPAQUE IOHEXOL 300 MG/ML  SOLN COMPARISON:  08/02/2017 radiograph, CT chest 08/31/2016 FINDINGS: Cardiovascular: Nonaneurysmal aorta. Normal heart size. No significant pericardial effusion. Mediastinum/Nodes: Midline trachea. No thyroid mass. Borderline mediastinal  lymph node, left paratracheal lymph node measures 9 mm. Lungs/Pleura: Small left-sided pleural effusion. Extensive consolidation within  the left lower lobe with partial consolidation in the left upper lobe and ground-glass density. No pneumothorax. Similar appearance of volume loss on the left. Upper Abdomen: 5 mm probable stone in the mid right kidney. Musculoskeletal: No chest wall abnormality. No acute or significant osseous findings. IMPRESSION: 1. Small left-sided pleural effusion. Extensive left lower lobe and left upper lobe consolidative process, most consistent with a pneumonia. 2. Nonobstructing stone within the right kidney. Electronically Signed   By: Donavan Foil M.D.   On: 08/02/2017 02:42      Phillips Climes M.D on 08/05/2017 at 3:51 PM  Between 7am to 7pm - Pager - [phone]  After 7pm go to [email or web address] - password [facility]  [facility] -  Office  [phone]

## 2017-08-05 NOTE — Progress Notes (Signed)
Pt with a  bp of 87/53. Paged provider Elgergawy. Awaiting further orders. Pt is asymptomatic and was given scheduled midodrine dose. Will continue to monitor pt and await further orders.

## 2017-08-05 NOTE — Progress Notes (Signed)
Per MD pt can DC back to SNF tomorrow with few more days IV antibiotics through the peripheral line- confirmed with SNF they could accept back  CSW will continue to follow  Jorge Ny, [place] Social Worker [phone]

## 2017-08-05 NOTE — Consult Note (Signed)
   [facility] CM Inpatient Consult   08/05/2017  Deanna Baldwin [birth year] [id number]  Patient screened for admissions and for high risk for unplanned readmission [facility] Management services. Patient is returning to skilled nursing facility.  No current [facility] Care Management needs at this time.   Please place a [facility] Care Management consult or for questions contact:   Natividad Brood, RN BSN [facility] Liaison  [phone] business mobile phone Toll free office [phone]

## 2017-08-06 NOTE — Progress Notes (Signed)
PROGRESS NOTE                                                                                                                                                                                                             Patient Demographics:    Deanna Baldwin, is a 42 y.o. female, DOB - [birth year], AYT:[id number]  Admit date - 08/01/2017   Admitting Physician Rise Patience, MD  Outpatient Primary MD for the patient is Deanna Cranker, DO  LOS - 4   Chief Complaint  Patient presents with  . Shortness of Breath       Brief Narrative   42 y.o. female with history of multiple sclerosis with paraplegia, large sacral decubitus ulcer, sacral osteomyelitis chronic anemia, hypothyroidism was expressing cough with shortness of breath and productive sputum last 2 days at her living facility.  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  Patient to finish total of 7 days of IV cefepime and vancomycin per ID recommendation, today's day 5 out of 7 -Was encouraged again today to use incentive spirometry -blood cultures remains with no growth to date  Sacral decubitus ulcer -Continue with wound care  Anemia of chronic illness -With anemia with significant hemoglobin drop from 7.1-5.9, this appears to be delusional as all cell lines has  been dropped, she was transfused 2 units PRBC with good response, hemoglobin today is 8.6 . -She is with anemia work-up in the past, was significant for anemia of chronic disease, folic acid, T55, ferritin within normal limits, only low iron . -Call still pending, but patient had an episode of normal color stool yesterday . -Is with a recent hospitalization in March 2019, required 2 units PRBC transfusion  Hypothyroidism -New with Synthroid  Multiple sclerosis -She is following with neurology as an outpatient  Chronic hypotension -With Midodrin, she does have some hypotensive episodes where she is a symptomatic, this is her baseline  Spastic paraplegia -Continue with home regimen including Zanaflex and baclofen  Hyponatremia -Patient to increase her fluid intake, will start on low-rate half-normal saline     Code Status : Full  Family Communication  : none at bedside  Disposition Plan  : back to SNF when stable  Consults  :  ID  Procedures  : none  DVT Prophylaxis  :  lovenox  Lab Results  Component Value Date   PLT 440 (H) 08/05/2017    Antibiotics  :    Anti-infectives (From admission, onward)   Start     Dose/Rate Route Frequency Ordered Stop   08/03/17 0000  vancomycin (VANCOCIN) IVPB 1000 mg/200 mL premix     1,000 mg 200 mL/hr over 60 Minutes Intravenous Every 24 hours 08/02/17 0012 08/08/17 2359   08/02/17 2200  ceFEPIme (MAXIPIME) 1 g in sodium chloride 0.9 % 100 mL IVPB     1 g 200 mL/hr over 30 Minutes Intravenous Every 24 hours 08/02/17 1745 08/08/17 2359   08/02/17 0830  piperacillin-tazobactam (ZOSYN) IVPB 3.375 g  Status:  Discontinued     3.375 g 12.5 mL/hr over 240 Minutes Intravenous Every 8 hours 08/02/17 0801 08/02/17 1716   08/02/17 0800  ceFEPIme (MAXIPIME) 1 g in sodium chloride 0.9 % 100 mL IVPB  Status:  Discontinued     1 g 200 mL/hr over 30 Minutes Intravenous Every 8 hours 08/02/17 0012 08/02/17 0744   08/02/17 0000  ceFEPIme  (MAXIPIME) 2 g in sodium chloride 0.9 % 100 mL IVPB     2 g 200 mL/hr over 30 Minutes Intravenous  Once 08/01/17 2352 08/02/17 0136   08/02/17 0000  vancomycin (VANCOCIN) IVPB 1000 mg/200 mL premix     1,000 mg 200 mL/hr over 60 Minutes Intravenous  Once 08/01/17 2352 08/02/17 0206        Objective:   Vitals:   08/06/17 0930 08/06/17 0943 08/06/17 1200 08/06/17 1342  BP:  102/60 99/67 99/67   Pulse:      Resp:  (!) 27 (!) 24   Temp:      TempSrc:      SpO2: 93% 92%  96%  Weight:      Height:        Wt Readings from Last 3 Encounters:  08/05/17 44.5 kg (98 lb)  07/07/17 40.9 kg (90 lb 3.2 oz)  05/09/17 41.8 kg (92 lb 3.2 oz)     Intake/Output Summary (Last 24 hours) at 08/06/2017 1630 Last data filed at 08/06/2017 1344 Gross per 24 hour  Intake 2040.48 ml  Output 1550 ml  Net 490.48 ml     Physical Exam   Awake Alert, Oriented X 3, No new F.N deficits, Normal affect Symmetrical Chest wall movement, Good air movement bilaterally, CTAB RRR,No Gallops,Rubs or new Murmurs, No Parasternal Heave +ve B.Sounds, Abd Soft,  No rebound - guarding or rigidity. Sacral decubitus ulcer, contracted lower extremities    Data Review:    CBC Recent Labs  Lab 08/01/17 2347 08/02/17 0539 08/03/17 0427 08/03/17 2100 08/04/17 0342 08/05/17 0627  WBC 12.5* 11.0* 8.0 9.4 7.3 7.8  HGB 8.0* 7.1* 5.9* 9.4* 8.6* 9.4*  HCT 26.4* 23.3* 19.4* 29.2* 26.5* 29.1*  PLT 487* 422* 353 422* 342 440*  MCV 103.5* 106.4* 104.3* 91.8 91.7 91.5  MCH 31.4  32.4 31.7 29.6 29.8 29.6  MCHC 30.3 30.5 30.4 32.2 32.5 32.3  RDW 12.0 12.2 12.2 18.3* 19.3* 18.6*  LYMPHSABS 0.8  --   --   --   --   --   MONOABS 0.8  --   --   --   --   --   EOSABS 0.1  --   --   --   --   --   BASOSABS 0.0  --   --   --   --   --     Chemistries  Recent Labs  Lab 08/01/17 2347 08/02/17 0652 08/02/17 0731 08/03/17 0427 08/04/17 0342 08/05/17 0627  NA 141  --  145 144 147* 144  K 6.2*  --  4.5 4.1 4.0 4.4  CL  103  --  109 113* 114* 112*  CO2 22  --  23 22 24 24   GLUCOSE 137*  --  136* 101* 116* 75  BUN 45*  --  36* 25* 20 21*  CREATININE 1.87* 1.49* 1.39* 1.00 0.78 0.76  CALCIUM 7.3*  --  7.1* 6.6* 7.8* 7.9*  AST 174*  --   --   --   --   --   ALT 87*  --   --   --   --   --   ALKPHOS 627*  --   --   --   --   --   BILITOT 1.8*  --   --   --   --   --    ------------------------------------------------------------------------------------------------------------------ No results for input(s): CHOL, HDL, LDLCALC, TRIG, CHOLHDL, LDLDIRECT in the last 72 hours.  Lab Results  Component Value Date   HGBA1C 5.5 06/24/2016   ------------------------------------------------------------------------------------------------------------------ No results for input(s): TSH, T4TOTAL, T3FREE, THYROIDAB in the last 72 hours.  Invalid input(s): FREET3 ------------------------------------------------------------------------------------------------------------------ No results for input(s): VITAMINB12, FOLATE, FERRITIN, TIBC, IRON, RETICCTPCT in the last 72 hours.  Coagulation profile Recent Labs  Lab 08/02/17 0002 08/02/17 0652  INR 1.21 1.35    No results for input(s): DDIMER in the last 72 hours.  Cardiac Enzymes No results for input(s): CKMB, TROPONINI, MYOGLOBIN in the last 168 hours.  Invalid input(s): CK ------------------------------------------------------------------------------------------------------------------ No results found for: BNP  Inpatient Medications  Scheduled Meds: . bisacodyl  5 mg Oral Daily  . calcium-vitamin D  2 tablet Oral QID  . Chlorhexidine Gluconate Cloth  6 each Topical Q0600  . enoxaparin  20 mg Subcutaneous Q24H  . famotidine  20 mg Oral Daily  . feeding supplement (ENSURE ENLIVE)  237 mL Oral TID BM  . ferrous sulfate  325 mg Oral Q breakfast  . guaiFENesin  600 mg Oral BID  . levothyroxine  50 mcg Oral QAC breakfast  . magnesium oxide  400 mg Oral  BID  . midodrine  2.5 mg Oral TID WC  . multivitamin with minerals  1 tablet Oral Daily  . mupirocin ointment  1 application Nasal BID  . polyethylene glycol  17 g Oral Daily  . tiZANidine  2 mg Oral TID  . vitamin B-12  100 mcg Oral Daily  . vitamin C  500 mg Oral Daily   Continuous Infusions: . sodium chloride 50 mL/hr at 08/06/17 0407  . ceFEPime (MAXIPIME) IV Stopped (08/05/17 2244)  . vancomycin Stopped (08/06/17 0115)   PRN Meds:.acetaminophen **OR** acetaminophen, HYDROcodone-acetaminophen, ondansetron **OR** ondansetron (ZOFRAN) IV  Micro Results Recent Results (from the past 240 hour(s))  Urine culture     Status:  Abnormal   Collection Time: 08/02/17 12:01 AM  Result Value Ref Range Status   Specimen Description URINE, CATHETERIZED  Final   Special Requests   Final    Immunocompromised Performed at [facility], [address], [place], [place] [address]    Culture MULTIPLE SPECIES PRESENT, SUGGEST RECOLLECTION (A)  Final   Report Status 08/03/2017 FINAL  Final  Culture, blood (Routine x 2)     Status: None (Preliminary result)   Collection Time: 08/02/17 12:02 AM  Result Value Ref Range Status   Specimen Description SITE NOT SPECIFIED  Final   Special Requests   Final    BOTTLES DRAWN AEROBIC AND ANAEROBIC Blood Culture results may not be optimal due to an inadequate volume of blood received in culture bottles   Culture   Final    NO GROWTH 4 DAYS Performed at [facility], [place] [address] [place], [place] [address]    Report Status PENDING  Incomplete  Culture, blood (Routine x 2)     Status: None (Preliminary result)   Collection Time: 08/02/17 12:03 AM  Result Value Ref Range Status   Specimen Description BLOOD LEFT WRIST  Final   Special Requests   Final    BOTTLES DRAWN AEROBIC AND ANAEROBIC Blood Culture results may not be optimal due to an excessive volume of blood received in culture bottles   Culture   Final    NO GROWTH 4 DAYS Performed  at [facility], [place] [address], [place], [place] [address]    Report Status PENDING  Incomplete  MRSA PCR Screening     Status: Abnormal   Collection Time: 08/02/17  7:44 AM  Result Value Ref Range Status   MRSA by PCR POSITIVE (A) NEGATIVE Final    Comment:        The GeneXpert MRSA Assay (FDA approved for NASAL specimens only), is one component of a comprehensive MRSA colonization surveillance program. It is not intended to diagnose MRSA infection nor to guide or monitor treatment for MRSA infections. RESULT CALLED TO, READ BACK BY AND VERIFIED WITH: Berlinda Last RN 12:10 08/02/08 ([email or web address]) Performed at [facility], [place] [address], [place], [place] [address]     Radiology Reports Dg Chest 2 View  Result Date: 08/02/2017 CLINICAL DATA:  42 year old female with fever. EXAM: CHEST - 2 VIEW COMPARISON:  Chest radiograph dated 03/17/2017 FINDINGS: There is a large left pleural effusion with consolidative changes of the majority of the left lung. Only a small aerated portion of the long noted in the left upper lobe. There is associated volume loss and shift of the mediastinum into the left hemithorax. The right lung is clear. No pneumothorax. No acute osseous pathology. IMPRESSION: Large left pleural effusion with opacification of the left hemithorax as described. CT may provide better evaluation of the size of the pleural effusion and assessment if thoracentesis is clinically considered. Electronically Signed   By: Anner Crete M.D.   On: 08/02/2017 01:23   Ct Chest W Contrast  Result Date: 08/02/2017 CLINICAL DATA:  Recent pneumonia, cough EXAM: CT CHEST WITH CONTRAST TECHNIQUE: Multidetector CT imaging of the chest was performed during intravenous contrast administration. CONTRAST:  55mL OMNIPAQUE IOHEXOL 300 MG/ML  SOLN COMPARISON:  08/02/2017 radiograph, CT chest 08/31/2016 FINDINGS: Cardiovascular: Nonaneurysmal aorta. Normal heart size. No significant pericardial  effusion. Mediastinum/Nodes: Midline trachea. No thyroid mass. Borderline mediastinal lymph node, left paratracheal lymph node measures 9 mm. Lungs/Pleura: Small left-sided pleural  effusion. Extensive consolidation within the left lower lobe with partial consolidation in the left upper lobe and ground-glass density. No pneumothorax. Similar appearance of volume loss on the left. Upper Abdomen: 5 mm probable stone in the mid right kidney. Musculoskeletal: No chest wall abnormality. No acute or significant osseous findings. IMPRESSION: 1. Small left-sided pleural effusion. Extensive left lower lobe and left upper lobe consolidative process, most consistent with a pneumonia. 2. Nonobstructing stone within the right kidney. Electronically Signed   By: Donavan Foil M.D.   On: 08/02/2017 02:42      Phillips Climes M.D on 08/06/2017 at 4:30 PM  Between 7am to 7pm - Pager - [phone]  After 7pm go to [email or web address] - password [facility]  [facility] -  Office  [phone]

## 2017-08-07 LAB — CULTURE, BLOOD (ROUTINE X 2)
CULTURE: NO GROWTH
Culture: NO GROWTH

## 2017-08-07 LAB — BASIC METABOLIC PANEL
ANION GAP: 7 (ref 5–15)
BUN: 15 mg/dL (ref 6–20)
CALCIUM: 8.5 mg/dL — AB (ref 8.9–10.3)
CO2: 24 mmol/L (ref 22–32)
CREATININE: 0.58 mg/dL (ref 0.44–1.00)
Chloride: 111 mmol/L (ref 98–111)
Glucose, Bld: 86 mg/dL (ref 70–99)
Potassium: 4.2 mmol/L (ref 3.5–5.1)
Sodium: 142 mmol/L (ref 135–145)

## 2017-08-07 LAB — VANCOMYCIN, TROUGH: VANCOMYCIN TR: 14 ug/mL — AB (ref 15–20)

## 2017-08-07 LAB — CBC
HEMATOCRIT: 25.5 % — AB (ref 36.0–46.0)
HEMOGLOBIN: 8 g/dL — AB (ref 12.0–15.0)
MCH: 29.7 pg (ref 26.0–34.0)
MCHC: 31.4 g/dL (ref 30.0–36.0)
MCV: 94.8 fL (ref 78.0–100.0)
Platelets: 364 10*3/uL (ref 150–400)
RBC: 2.69 MIL/uL — ABNORMAL LOW (ref 3.87–5.11)
RDW: 16.5 % — AB (ref 11.5–15.5)
WBC: 6.8 10*3/uL (ref 4.0–10.5)

## 2017-08-07 LAB — OCCULT BLOOD X 1 CARD TO LAB, STOOL: FECAL OCCULT BLD: NEGATIVE

## 2017-08-07 MED ORDER — POLYETHYLENE GLYCOL 3350 17 G PO PACK
17.0000 g | PACK | Freq: Every day | ORAL | Status: DC
  Administered 2017-08-09: 17 g via ORAL
  Filled 2017-08-07 (×3): qty 1

## 2017-08-07 MED ORDER — VANCOMYCIN HCL 10 G IV SOLR
1250.0000 mg | INTRAVENOUS | Status: AC
  Administered 2017-08-07 – 2017-08-08 (×2): 1250 mg via INTRAVENOUS
  Filled 2017-08-07 (×3): qty 1250

## 2017-08-07 NOTE — Progress Notes (Signed)
Pharmacy Antibiotic Note  Deanna Baldwin is a 42 y.o. female admitted on 08/01/2017 with pneumonia.  Pharmacy has been consulted for Vancomycin dosing. WBC back WNL. Renal function has normalized x 3 days.   Vancomycin trough tonight is just below goal at 14  Plan: -Inc vancomycin dose to 1250 mg IV q24h -No need for further levels as there are only 2 doses left to complete the planned 7 day course  Height: 5\' 9"  (175.3 cm)(taken from 07/07/17) Weight: 98 lb (44.5 kg) IBW/kg (Calculated) : 66.2  Temp (24hrs), Avg:98.9 F (37.2 C), Min:98.7 F (37.1 C), Max:99.1 F (37.3 C)  Recent Labs  Lab 08/02/17 0243 08/02/17 0721 08/02/17 0731 08/02/17 0804 08/02/17 1519 08/03/17 0427 08/03/17 2100 08/04/17 0342 08/04/17 2258 08/05/17 0627 08/06/17 2323  WBC  --  11.0*  --   --   --  8.0 9.4 7.3  --  7.8  --   CREATININE  --  1.49* 1.39*  --   --  1.00  --  0.78  --  0.76  --   LATICACIDVEN 2.47* 3.7*  --  2.5* 2.6* 1.4  --   --   --   --   --   VANCOTROUGH  --   --   --   --   --   --   --   --  17  --  14*    Estimated Creatinine Clearance: 65 mL/min (by C-G formula based on SCr of 0.76 mg/dL).    No Known Allergies   Deanna Baldwin 08/07/2017 12:26 AM

## 2017-08-07 NOTE — Progress Notes (Signed)
PROGRESS NOTE                                                                                                                                                                                                             Patient Demographics:    Deanna Baldwin, is a 42 y.o. female, DOB - 08/14/1975, FUX:[id number]  Admit date - 08/01/2017   Admitting Physician Rise Patience, MD  Outpatient Primary MD for the patient is [name], DO  LOS - 5   Chief Complaint  Patient presents with  . Shortness of Breath       Brief Narrative   42 y.o. female with history of multiple sclerosis with paraplegia, large sacral decubitus ulcer, sacral osteomyelitis chronic anemia, hypothyroidism was expressing cough with shortness of breath and productive sputum last 2 days at her living facility.  She was admitted secondary to healthcare associated pneumonia, as well work-up significant for anemia with hemoglobin of 5.9   Subjective:    Aramis Eoff today has, No headache, No chest pain, No abdominal pain , per staff patient has normal color stool    Principal Problem:   HCAP (healthcare-associated pneumonia) Active Problems:   Spastic 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 ([place])   Anemia of chronic disease   Sepsis ([place])   Pressure ulcer, stage 4 ([place])   Hypothyroidism   Sepsis secondary to HCAP -Imaging significant for up pneumonia, antibiotics management per ID, continue with IV vancomycin (MRSA nares +) and cefepime (Zosyn changed to cefepime by ID.  Patient to finish total of 7 days of IV cefepime and vancomycin per ID recommendation, today's day 6 out of 7 -Was encouraged again today to use incentive spirometry -blood cultures remains with no growth to date  Sacral decubitus ulcer -Continue with wound care  Anemia of chronic illness -With anemia with significant hemoglobin drop from 7.1-5.9, this appears to be delusional as all cell lines  has been dropped, she was transfused 2 units PRBC with good response, hemoglobin remained stable -Hemoccult negative stool this admission, no evidence of GI bleed or melena -She is with anemia work-up in the past, was significant for anemia of chronic disease, folic acid, G25, ferritin within normal limits, only low iron . -Call still pending, but patient had an episode of normal color stool yesterday . -Is with a recent hospitalization  in March 2019, required 2 units PRBC transfusion  Hypothyroidism -New with Synthroid  Multiple sclerosis -She is following with neurology as an outpatient  Chronic hypotension -With Midodrin, she does have some hypotensive episodes where she is a symptomatic, this is her baseline  Spastic paraplegia -Continue with home regimen including Zanaflex and baclofen  Hypernatremia -Encouraged to increase her fluid intake, has been corrected, will DC half-normal saline     Code Status : Full  Family Communication  : none at bedside  Disposition Plan  : back to SNF tomorrow.  Consults  :  ID  Procedures  : none  DVT Prophylaxis  :  lovenox  Lab Results  Component Value Date   PLT 364 08/07/2017    Antibiotics  :    Anti-infectives (From admission, onward)   Start     Dose/Rate Route Frequency Ordered Stop   08/07/17 0019  vancomycin (VANCOCIN) 1,250 mg in sodium chloride 0.9 % 250 mL IVPB     1,250 mg 166.7 mL/hr over 90 Minutes Intravenous Every 24 hours 08/07/17 0019 08/09/17 0029   08/03/17 0000  vancomycin (VANCOCIN) IVPB 1000 mg/200 mL premix  Status:  Discontinued     1,000 mg 200 mL/hr over 60 Minutes Intravenous Every 24 hours 08/02/17 0012 08/07/17 0019   08/02/17 2200  ceFEPIme (MAXIPIME) 1 g in sodium chloride 0.9 % 100 mL IVPB     1 g 200 mL/hr over 30 Minutes Intravenous Every 24 hours 08/02/17 1745 08/08/17 2359   08/02/17 0830  piperacillin-tazobactam (ZOSYN) IVPB 3.375 g  Status:  Discontinued     3.375 g 12.5 mL/hr over  240 Minutes Intravenous Every 8 hours 08/02/17 0801 08/02/17 1716   08/02/17 0800  ceFEPIme (MAXIPIME) 1 g in sodium chloride 0.9 % 100 mL IVPB  Status:  Discontinued     1 g 200 mL/hr over 30 Minutes Intravenous Every 8 hours 08/02/17 0012 08/02/17 0744   08/02/17 0000  ceFEPIme (MAXIPIME) 2 g in sodium chloride 0.9 % 100 mL IVPB     2 g 200 mL/hr over 30 Minutes Intravenous  Once 08/01/17 2352 08/02/17 0136   08/02/17 0000  vancomycin (VANCOCIN) IVPB 1000 mg/200 mL premix     1,000 mg 200 mL/hr over 60 Minutes Intravenous  Once 08/01/17 2352 08/02/17 0206        Objective:   Vitals:   08/07/17 0231 08/07/17 1000 08/07/17 1336 08/07/17 1342  BP: 104/63   104/68  Pulse:   93 89  Resp: 20  (!) 26 18  Temp: 98.3 F (36.8 C) 98.3 F (36.8 C)    TempSrc: Oral Oral    SpO2: 100%  98% 96%  Weight:      Height:        Wt Readings from Last 3 Encounters:  08/05/17 44.5 kg (98 lb)  07/07/17 40.9 kg (90 lb 3.2 oz)  05/09/17 41.8 kg (92 lb 3.2 oz)     Intake/Output Summary (Last 24 hours) at 08/07/2017 1506 Last data filed at 08/07/2017 1300 Gross per 24 hour  Intake 1456.37 ml  Output 1050 ml  Net 406.37 ml     Physical Exam   Awake Alert, Oriented X 3, No new F.N deficits, Normal affect.  Symmetrical Chest wall movement, Good air movement bilaterally, CTAB RRR,No Gallops,Rubs or new Murmurs, No Parasternal Heave +ve B.Sounds, Abd Soft, No tenderness, , No rebound - guarding or rigidity. Sacral decubitus ulcer, contracted lower extremities    Data Review:  CBC Recent Labs  Lab 08/01/17 2347  08/03/17 0427 08/03/17 2100 08/04/17 0342 08/05/17 0627 08/07/17 0425  WBC 12.5*   < > 8.0 9.4 7.3 7.8 6.8  HGB 8.0*   < > 5.9* 9.4* 8.6* 9.4* 8.0*  HCT 26.4*   < > 19.4* 29.2* 26.5* 29.1* 25.5*  PLT 487*   < > 353 422* 342 440* 364  MCV 103.5*   < > 104.3* 91.8 91.7 91.5 94.8  MCH 31.4   < > 31.7 29.6 29.8 29.6 29.7  MCHC 30.3   < > 30.4 32.2 32.5 32.3 31.4  RDW 12.0    < > 12.2 18.3* 19.3* 18.6* 16.5*  LYMPHSABS 0.8  --   --   --   --   --   --   MONOABS 0.8  --   --   --   --   --   --   EOSABS 0.1  --   --   --   --   --   --   BASOSABS 0.0  --   --   --   --   --   --    < > = values in this interval not displayed.    Chemistries  Recent Labs  Lab 08/01/17 2347  08/02/17 0731 08/03/17 0427 08/04/17 0342 08/05/17 0627 08/07/17 0425  NA 141  --  145 144 147* 144 142  K 6.2*  --  4.5 4.1 4.0 4.4 4.2  CL 103  --  109 113* 114* 112* 111  CO2 22  --  23 22 24 24 24   GLUCOSE 137*  --  136* 101* 116* 75 86  BUN 45*  --  36* 25* 20 21* 15  CREATININE 1.87*   < > 1.39* 1.00 0.78 0.76 0.58  CALCIUM 7.3*  --  7.1* 6.6* 7.8* 7.9* 8.5*  AST 174*  --   --   --   --   --   --   ALT 87*  --   --   --   --   --   --   ALKPHOS 627*  --   --   --   --   --   --   BILITOT 1.8*  --   --   --   --   --   --    < > = values in this interval not displayed.   ------------------------------------------------------------------------------------------------------------------ No results for input(s): CHOL, HDL, LDLCALC, TRIG, CHOLHDL, LDLDIRECT in the last 72 hours.  Lab Results  Component Value Date   HGBA1C 5.5 06/24/2016   ------------------------------------------------------------------------------------------------------------------ No results for input(s): TSH, T4TOTAL, T3FREE, THYROIDAB in the last 72 hours.  Invalid input(s): FREET3 ------------------------------------------------------------------------------------------------------------------ No results for input(s): VITAMINB12, FOLATE, FERRITIN, TIBC, IRON, RETICCTPCT in the last 72 hours.  Coagulation profile Recent Labs  Lab 08/02/17 0002 08/02/17 0652  INR 1.21 1.35    No results for input(s): DDIMER in the last 72 hours.  Cardiac Enzymes No results for input(s): CKMB, TROPONINI, MYOGLOBIN in the last 168 hours.  Invalid input(s):  CK ------------------------------------------------------------------------------------------------------------------ No results found for: BNP  Inpatient Medications  Scheduled Meds: . bisacodyl  5 mg Oral Daily  . calcium-vitamin D  2 tablet Oral QID  . Chlorhexidine Gluconate Cloth  6 each Topical Q0600  . enoxaparin  20 mg Subcutaneous Q24H  . famotidine  20 mg Oral Daily  . feeding supplement (ENSURE ENLIVE)  237 mL Oral TID BM  . ferrous sulfate  325 mg Oral Q breakfast  . guaiFENesin  600 mg Oral BID  . levothyroxine  50 mcg Oral QAC breakfast  . magnesium oxide  400 mg Oral BID  . midodrine  2.5 mg Oral TID WC  . multivitamin with minerals  1 tablet Oral Daily  . mupirocin ointment  1 application Nasal BID  . polyethylene glycol  17 g Oral Daily  . tiZANidine  2 mg Oral TID  . vitamin B-12  100 mcg Oral Daily  . vitamin C  500 mg Oral Daily   Continuous Infusions: . ceFEPime (MAXIPIME) IV Stopped (08/06/17 2155)  . vancomycin 1,250 mg (08/07/17 0057)   PRN Meds:.acetaminophen **OR** acetaminophen, HYDROcodone-acetaminophen, ondansetron **OR** ondansetron (ZOFRAN) IV  Micro Results Recent Results (from the past 240 hour(s))  Urine culture     Status: Abnormal   Collection Time: 08/02/17 12:01 AM  Result Value Ref Range Status   Specimen Description URINE, CATHETERIZED  Final   Special Requests   Final    Immunocompromised Performed at [facility], [place] [address], [place], [place] [address]    Culture MULTIPLE SPECIES PRESENT, SUGGEST RECOLLECTION (A)  Final   Report Status 08/03/2017 FINAL  Final  Culture, blood (Routine x 2)     Status: None   Collection Time: 08/02/17 12:02 AM  Result Value Ref Range Status   Specimen Description SITE NOT SPECIFIED  Final   Special Requests   Final    BOTTLES DRAWN AEROBIC AND ANAEROBIC Blood Culture results may not be optimal due to an inadequate volume of blood received in culture bottles   Culture   Final    NO  GROWTH 5 DAYS Performed at [facility], [place] [address] [place], [place] [address]    Report Status 08/07/2017 FINAL  Final  Culture, blood (Routine x 2)     Status: None   Collection Time: 08/02/17 12:03 AM  Result Value Ref Range Status   Specimen Description BLOOD LEFT WRIST  Final   Special Requests   Final    BOTTLES DRAWN AEROBIC AND ANAEROBIC Blood Culture results may not be optimal due to an excessive volume of blood received in culture bottles   Culture   Final    NO GROWTH 5 DAYS Performed at [facility], [place] [address], [place], [place] [address]    Report Status 08/07/2017 FINAL  Final  MRSA PCR Screening     Status: Abnormal   Collection Time: 08/02/17  7:44 AM  Result Value Ref Range Status   MRSA by PCR POSITIVE (A) NEGATIVE Final    Comment:        The GeneXpert MRSA Assay (FDA approved for NASAL specimens only), is one component of a comprehensive MRSA colonization surveillance program. It is not intended to diagnose MRSA infection nor to guide or monitor treatment for MRSA infections. RESULT CALLED TO, READ BACK BY AND VERIFIED WITH: Berlinda Last RN 12:10 08/02/08 ([email or web address]) Performed at [facility], [place] [address] [place], [place] [address]     Radiology Reports Dg Chest 2 View  Result Date: 08/02/2017 CLINICAL DATA:  42 year old female with fever. EXAM: CHEST - 2 VIEW COMPARISON:  Chest radiograph dated 03/17/2017 FINDINGS: There is a large left pleural effusion with consolidative changes of the majority of the left lung. Only a small aerated portion of the long noted in the left upper lobe. There is associated volume loss and shift of the mediastinum into the left hemithorax.  The right lung is clear. No pneumothorax. No acute osseous pathology. IMPRESSION: Large left pleural effusion with opacification of the left hemithorax as described. CT may provide better evaluation of the size of the pleural effusion and assessment if thoracentesis is  clinically considered. Electronically Signed   By: Anner Crete M.D.   On: 08/02/2017 01:23   Ct Chest W Contrast  Result Date: 08/02/2017 CLINICAL DATA:  Recent pneumonia, cough EXAM: CT CHEST WITH CONTRAST TECHNIQUE: Multidetector CT imaging of the chest was performed during intravenous contrast administration. CONTRAST:  54mL OMNIPAQUE IOHEXOL 300 MG/ML  SOLN COMPARISON:  08/02/2017 radiograph, CT chest 08/31/2016 FINDINGS: Cardiovascular: Nonaneurysmal aorta. Normal heart size. No significant pericardial effusion. Mediastinum/Nodes: Midline trachea. No thyroid mass. Borderline mediastinal lymph node, left paratracheal lymph node measures 9 mm. Lungs/Pleura: Small left-sided pleural effusion. Extensive consolidation within the left lower lobe with partial consolidation in the left upper lobe and ground-glass density. No pneumothorax. Similar appearance of volume loss on the left. Upper Abdomen: 5 mm probable stone in the mid right kidney. Musculoskeletal: No chest wall abnormality. No acute or significant osseous findings. IMPRESSION: 1. Small left-sided pleural effusion. Extensive left lower lobe and left upper lobe consolidative process, most consistent with a pneumonia. 2. Nonobstructing stone within the right kidney. Electronically Signed   By: Donavan Foil M.D.   On: 08/02/2017 02:42      Phillips Climes M.D on 08/07/2017 at 3:06 PM  Between 7am to 7pm - Pager - [phone]  After 7pm go to [email or web address] - password [email or web address]  [facility] -  Office  [phone]

## 2017-08-08 ENCOUNTER — Inpatient Hospital Stay (HOSPITAL_COMMUNITY): Payer: Medicare Other

## 2017-08-08 DIAGNOSIS — J9811 Atelectasis: Secondary | ICD-10-CM

## 2017-08-08 LAB — CBC
HCT: 26.2 % — ABNORMAL LOW (ref 36.0–46.0)
Hemoglobin: 8.3 g/dL — ABNORMAL LOW (ref 12.0–15.0)
MCH: 30 pg (ref 26.0–34.0)
MCHC: 31.7 g/dL (ref 30.0–36.0)
MCV: 94.6 fL (ref 78.0–100.0)
PLATELETS: 404 10*3/uL — AB (ref 150–400)
RBC: 2.77 MIL/uL — AB (ref 3.87–5.11)
RDW: 15.5 % (ref 11.5–15.5)
WBC: 7.9 10*3/uL (ref 4.0–10.5)

## 2017-08-08 LAB — BASIC METABOLIC PANEL
ANION GAP: 7 (ref 5–15)
BUN: 14 mg/dL (ref 6–20)
CO2: 24 mmol/L (ref 22–32)
Calcium: 8.8 mg/dL — ABNORMAL LOW (ref 8.9–10.3)
Chloride: 111 mmol/L (ref 98–111)
Creatinine, Ser: 0.52 mg/dL (ref 0.44–1.00)
Glucose, Bld: 84 mg/dL (ref 70–99)
Potassium: 4.7 mmol/L (ref 3.5–5.1)
SODIUM: 142 mmol/L (ref 135–145)

## 2017-08-08 MED ORDER — SODIUM CHLORIDE 0.9 % IV SOLN
1250.0000 mg | INTRAVENOUS | Status: DC
Start: 2017-08-09 — End: 2017-08-11
  Administered 2017-08-09 – 2017-08-10 (×3): 1250 mg via INTRAVENOUS
  Filled 2017-08-08 (×4): qty 1250

## 2017-08-08 MED ORDER — IPRATROPIUM-ALBUTEROL 0.5-2.5 (3) MG/3ML IN SOLN
RESPIRATORY_TRACT | Status: AC
  Administered 2017-08-08: 3 mL
  Filled 2017-08-08: qty 3

## 2017-08-08 MED ORDER — IPRATROPIUM-ALBUTEROL 0.5-2.5 (3) MG/3ML IN SOLN
3.0000 mL | Freq: Four times a day (QID) | RESPIRATORY_TRACT | Status: DC
  Administered 2017-08-09 – 2017-08-10 (×5): 3 mL via RESPIRATORY_TRACT
  Filled 2017-08-08 (×5): qty 3

## 2017-08-08 MED ORDER — CEFEPIME HCL 1 G IJ SOLR
1.0000 g | INTRAMUSCULAR | Status: DC
Start: 2017-08-08 — End: 2017-08-11
  Administered 2017-08-08 – 2017-08-10 (×3): 1 g via INTRAVENOUS
  Filled 2017-08-08 (×4): qty 1

## 2017-08-08 MED ORDER — ACETYLCYSTEINE 20 % IN SOLN
4.0000 mL | Freq: Four times a day (QID) | RESPIRATORY_TRACT | Status: DC
  Administered 2017-08-08 – 2017-08-10 (×7): 4 mL via RESPIRATORY_TRACT
  Filled 2017-08-08 (×8): qty 4

## 2017-08-08 MED ORDER — ACETYLCYSTEINE 20 % IN SOLN: 4.0000 mL | Freq: Four times a day (QID) | RESPIRATORY_TRACT | Status: DC

## 2017-08-08 MED ORDER — BACID PO TABS
2.0000 | ORAL_TABLET | Freq: Three times a day (TID) | ORAL | Status: DC
  Administered 2017-08-08 – 2017-08-11 (×9): 2 via ORAL
  Filled 2017-08-08 (×9): qty 2

## 2017-08-08 NOTE — Consult Note (Signed)
PULMONARY / CRITICAL CARE MEDICINE  Name: Deanna Baldwin MRN: [id number] DOB: 07-11-1975    ADMISSION DATE:  08/01/2017 CONSULTATION DATE:  08/08/17  REFERRING MD :  Elgergawy  CHIEF COMPLAINT:  SOB   HISTORY OF PRESENT ILLNESS:  Deanna Baldwin is a 42 y.o. female with a PMH as outlined below including MS with paraplegia who lives in a facility.  She was admitted 08/02/17 with productive cough, SOB, fevers, chills.  Roommate had also been sick and was diagnosed with PNA.  In ED, she had CT scan that demonstrated multilobar PNA with extensive LL and LUL consolidation.  She was started on empiric HCAP coverage.  Due to her PNA, urostomy, multiple skin wounds / decub ulcers, ID was consulted 7/2.  Abx therapy was changed from vanc / zosyn to vanc / cefepime (MRSA screen positive).  She was also found to have hypotension but it was noted that she is chronically hypotensive.  CXR 7/8 showed near total opacification of left hemithorax (minimal change from admit CXR).  PCCM was subsequently consulted to assist with management.   She currently denies dyspnea, chest pain.  States breathing is "OK".  On room air.   PAST MEDICAL HISTORY :   has a past medical history of Buttock wound (03/03/2016), Dysrhythmia, MS (multiple sclerosis) ([place]), Neutropenia ([place]), Pneumonia (02/2016), Protein calorie malnutrition ([place]), Sacral osteomyelitis ([place]) (05/19/2017), Severe sepsis ([place]) (03/03/2016), Spastic 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 ([place]), and UTI (urinary tract infection) (02/2016).  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 Prior to Admission medications   Medication Sig Start Date End Date Taking? Authorizing Provider  acetaminophen (TYLENOL) 325 MG tablet Take 2 tablets (650 mg total) by mouth every 6 (six) hours as needed for mild pain (or Fever >/= 101). 04/11/17  Yes Eugenie Filler, MD  Ascorbic Acid (VITAMIN C PO) Take 500 mg by mouth 2 (two) times daily.     Yes [provider]  bisacodyl (DULCOLAX) 5 MG EC tablet Take 5 mg by mouth daily.   Yes [provider]  CALCIUM ALGINATE EX Apply to coccyx and right ischial daily   Yes [provider]  calcium-vitamin D (OSCAL WITH D) 500-200 MG-UNIT tablet Take 2 tablets by mouth 4 (four) times daily. 04/11/17  Yes Eugenie Filler, MD  Cyanocobalamin (VITAMIN B-12 PO) Take 1 tablet by mouth daily.   Yes [provider]  ENSURE (ENSURE) Take 237 mLs by mouth. With meals. Vanilla   Yes [provider]  famotidine (PEPCID) 20 MG tablet Take 20 mg by mouth every morning.   Yes [provider]  ferrous sulfate 325 (65 FE) MG tablet Take 325 mg by mouth daily with breakfast.   Yes [provider]  guaiFENesin (MUCINEX) 600 MG 12 hr tablet Take 600 mg by mouth 2 (two) times daily. 04/21/17  Yes [provider]  HYDROcodone-acetaminophen (NORCO/VICODIN) 5-325 MG tablet Take 1 tablet by mouth every 4 (four) hours as needed for moderate pain. 04/12/17  Yes [provider]  ibuprofen (ADVIL,MOTRIN) 200 MG tablet Take 200 mg by mouth every 12 (twelve) hours as needed. 04/25/17  Yes [provider]  levothyroxine (SYNTHROID) 50 MCG tablet Take 50 mcg by mouth daily before breakfast. 07/02/17  Yes [provider]  magnesium oxide (MAG-OX) 400 (241.3 Mg) MG tablet Take 1 tablet (400 mg total) by mouth 2 (two) times daily. 04/23/17  Yes Daleen Bo, MD  midodrine (PROAMATINE) 2.5 MG tablet Take 1 tablet (  2.5 mg total) by mouth 3 (three) times daily with meals. 09/10/16  Yes Elgergawy, Silver Huguenin, MD  Multiple Vitamin (MULTIVITAMIN) tablet Take 1 tablet by mouth daily. 06/13/17  Yes [provider]  Nutritional Supplements (NUTRITIONAL SUPPLEMENT PO) Frozen Nutritional Treat with meals 06/15/17  Yes [provider]  Nutritional Supplements (PROMOD) LIQD Protein Liquid - give 30 ml by mouth two times daily   Yes  [provider]  ondansetron (ZOFRAN) 4 MG tablet Take 4 mg by mouth every 6 (six) hours as needed for nausea or vomiting.   Yes [provider]  polyethylene glycol (MIRALAX / GLYCOLAX) packet Take 17 g by mouth daily.   Yes [provider]  Probiotic Product (PROBIOTIC PO) Take 1 tablet by mouth daily.   Yes [provider]  tizanidine (ZANAFLEX) 2 MG capsule Take 2 mg by mouth 3 (three) times daily.   Yes [provider]  Vitamin D, Ergocalciferol, (DRISDOL) 50000 units CAPS capsule Take 50,000 Units by mouth every 30 (thirty) days.   Yes [provider]  UNABLE TO FIND PROTEIN LIQUID- 30 ml 2 TIMES DAILY RELATED TO UNSPECIFIED BUTTOCK    [provider]   No Known Allergies  FAMILY HISTORY:  family history includes Mental illness in her sister. SOCIAL HISTORY:  reports that she quit smoking about 7 years ago. Her smoking use included cigarettes. She has a 10.00 pack-year smoking history. She has never used smokeless tobacco. She reports that she does not drink alcohol or use drugs.  REVIEW OF SYSTEMS: . All negative; except for those that are bolded, which indicate positives.  Constitutional: weight loss, weight gain, night sweats, fevers, chills, fatigue, weakness.  HEENT: headaches, sore throat, sneezing, nasal congestion, post nasal drip, difficulty swallowing, tooth/dental problems, visual complaints, visual changes, ear aches. Neuro: difficulty with speech, weakness, numbness, ataxia. CV:  chest pain, orthopnea, PND, swelling in lower extremities, dizziness, palpitations, syncope.  Resp: cough, hemoptysis, dyspnea, wheezing. GI: heartburn, indigestion, abdominal pain, nausea, vomiting, diarrhea, constipation, change in bowel habits, loss of appetite, hematemesis, melena, hematochezia.  GU: dysuria, change in color of urine, urgency or frequency, flank pain, hematuria. MSK: joint pain or swelling, decreased range of  motion. Psych: change in mood or affect, depression, anxiety, suicidal ideations, homicidal ideations. Skin: rash, itching, bruising.   SUBJECTIVE:  Vitals stable, sats 95 - 98% on room air.  VITAL SIGNS: Temp:  [98.3 F (36.8 C)-99.2 F (37.3 C)] 98.8 F (37.1 C) (07/08 0835) Pulse Rate:  [64-101] 101 (07/08 1118) Resp:  [18-30] 24 (07/08 1118) BP: (90-111)/(54-86) 107/62 (07/08 1118) SpO2:  [94 %-98 %] 94 % (07/08 1118)  PHYSICAL EXAMINATION: General: Young female, chronically ill, resting in bed, in NAD. Neuro: Awake, follows basic commands, answers basic questions. HEENT: [place]/AT. Sclerae anicteric, EOMI. Cardiovascular: RRR, no M/R/G.  Lungs: Respirations unlabored.  Diminished left side, otherwise clear. Abdomen: BS x 4, soft, NT/ND.  Musculoskeletal: Contracted UE and LE's.  No edema.  Skin: Intact, warm, no rashes.   Recent Labs  Lab 08/05/17 0627 08/07/17 0425 08/08/17 0740  NA 144 142 142  K 4.4 4.2 4.7  CL 112* 111 111  CO2 24 24 24   BUN 21* 15 14  CREATININE 0.76 0.58 0.52  GLUCOSE 75 86 84   Recent Labs  Lab 08/05/17 0627 08/07/17 0425 08/08/17 0740  HGB 9.4* 8.0* 8.3*  HCT 29.1* 25.5* 26.2*  WBC 7.8 6.8 7.9  PLT 440* 364 404*   Dg Chest Port 1  View  Result Date: 08/08/2017 CLINICAL DATA:  Tachypnea today. EXAM: PORTABLE CHEST 1 VIEW COMPARISON:  Chest x-ray of August 02, 2017 FINDINGS: There is now all but near-total opacification of the left hemithorax with shift of the mediastinum toward the left. The right lung is hyperinflated. There are coarse lung markings in the right infrahilar region more conspicuous today. The cardiac silhouette is obscured. The pulmonary vascularity on the right is not engorged. IMPRESSION: Near-total opacification of the left hemithorax which has worsened over the past 6 days. Given the mediastinal shift toward the left, a central obstructing lesion is suspected. Subtle increased density in the right infrahilar region may  reflect atelectasis or pneumonia. Electronically Signed   By: David  Martinique M.D.   On: 08/08/2017 08:02    STUDIES:  CT chest 7/2 > small left effusion, extensive LLL and LUL consolidation.  Non-obstructing right kidney stone. CXR 7/8 > near total opacification of left hemithorax with ipsilateral mediastinal shift.  SIGNIFICANT EVENTS  7/2 > admit, ID consult. 7/8 > PCCM consult.  ASSESSMENT / PLAN:  HCAP. Complete left hemithorax opacification / consolidation - presumed due to above with significant atelectasis and possible mucus plugging also contributing (had CT chest 7/2). Plan: Continue abx (vanc / cefepime). Follow cultures. Add chest vest and mucomyst nebs. Push IS / flutter valve - encouraged pt to use frequently and personally witnessed her perform while I was in room. Follow CXR. No role for repeat CT today - have d/c'd (just had CT chest 08/02/17).  Rest per primary team.   Montey Hora, [facility] Pager: [phone]  or [phone] 08/08/2017, 12:09 PM

## 2017-08-08 NOTE — Progress Notes (Signed)
  Speech Language Pathology Treatment: Dysphagia  Patient Details Name: Deanna Baldwin MRN: [id number] DOB: 12/28/1975 Today's Date: 08/08/2017 Time: 1583-0940 SLP Time Calculation (min) (ACUTE ONLY): 10 min  Assessment / Plan / Recommendation Clinical Impression  Pt needed Min cues for accurate use of a chin tuck with thin liquid trials. She knows to use it and attempts to use it consistently, although she would intermittently bring her head up to neutral before she had completely finished swallowing. She was able to better coordinate this after cueing and demonstration from SLP. CXR from today noted with concern for worsening opacification of the L hemithorax with concern for central obstructing lesion as well as subtle increased density in the R infrahilar region that could be PNA. Additional SLP f/u acutely indicated.   HPI HPI: 42 year old female admitted 08/01/17 with sepsis. PMH: MS with spastic paraplegia, PNA, silent aspiration. CXR = LLL/LUL consolidative process c/w PNA, left pleural effusion       SLP Plan  Continue with current plan of care       Recommendations  Diet recommendations: Regular;Thin liquid Liquids provided via: Straw Medication Administration: Whole meds with puree Supervision: Patient able to self feed;Full supervision/cueing for compensatory strategies Compensations: Slow rate;Small sips/bites;Chin tuck;Use straw to facilitate chin tuck Postural Changes and/or Swallow Maneuvers: Seated upright 90 degrees                Oral Care Recommendations: Oral care BID Follow up Recommendations: Skilled Nursing facility SLP Visit Diagnosis: Dysphagia, oropharyngeal phase (R13.12) Plan: Continue with current plan of care       GO                [name] 08/08/2017, 4:44 PM  [name], M.A. CCC-SLP [phone]

## 2017-08-08 NOTE — Progress Notes (Signed)
CSW continuing to follow for discharge needs.  Ambers Iyengar LCSW [phone]  

## 2017-08-08 NOTE — Progress Notes (Signed)
PROGRESS NOTE                                                                                                                                                                                                             Patient Demographics:    Deanna Baldwin, is a 42 y.o. female, DOB - 09-22-1975, MKL:[id number]  Admit date - 08/01/2017   Admitting Physician Rise Patience, MD  Outpatient Primary MD for the patient is [name], DO  LOS - 6   Chief Complaint  Patient presents with  . Shortness of Breath       Brief Narrative   42 y.o. female with history of multiple sclerosis with paraplegia, large sacral decubitus ulcer, sacral osteomyelitis chronic anemia, hypothyroidism was expressing cough with shortness of breath and productive sputum last 2 days at her living facility.  She was admitted secondary to healthcare associated pneumonia, as well work-up significant for anemia with hemoglobin of 5.9, remained stable after 2 units PRBC transfusion, repeat chest x-ray after 7 days of IV antibiotic treatments showing worsening of left lung pneumonia with total opacification, pulmonary consulted.   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  -Antibiotics recommendation were per ID, day 7 out of 7, but given lack of improvement of her up pneumonia, will continue with current coverage. -Patient with complete left hemithorax  opacification/consolidation on repeat imaging today, due to poor inspiratory effort with significant atelectasis, with possible mucous plugging, pulmonary were consulted, and a daily basis to use incentive spirometry and flutter valve, RT were consulted, continue with chest vest and Mucomyst nebs. -blood cultures remains with no growth to date  Sacral decubitus ulcer -Continue with wound care  Anemia of chronic illness -With anemia with significant hemoglobin drop from 7.1-5.9, this appears to be delusional as all cell lines has been dropped, she was transfused  2 units PRBC with good response, hemoglobin remained stable -Hemoccult negative stool this admission, no evidence of GI bleed or melena, Hemoccult negative -She is with anemia work-up in the past, was significant for anemia of chronic disease, folic acid, D78, ferritin within normal limits, only low iron . -Call still pending, but patient had an episode of normal color stool yesterday . -Is with a recent hospitalization in March 2019, required 2 units PRBC transfusion  Hypothyroidism -Continue with Synthroid  Multiple sclerosis -She is following with neurology as an outpatient  Chronic hypotension -With Midodrin, she does have some hypotensive episodes where she is a symptomatic, this is her baseline  Spastic paraplegia -Continue with home regimen including Zanaflex and baclofen  Hypernatremia -Encouraged to increase her fluid intake, has been corrected, will DC half-normal saline     Code Status : Full  Family Communication  : none at bedside  Disposition Plan  : back to SNF tomorrow.  Consults  :  ID  Procedures  : none  DVT Prophylaxis  :  lovenox  Lab Results  Component Value Date   PLT 404 (H) 08/08/2017    Antibiotics  :    Anti-infectives (From admission, onward)   Start     Dose/Rate Route Frequency Ordered Stop   08/07/17 0019  vancomycin (VANCOCIN) 1,250 mg in sodium chloride 0.9 % 250 mL IVPB      1,250 mg 166.7 mL/hr over 90 Minutes Intravenous Every 24 hours 08/07/17 0019 08/08/17 0222   08/03/17 0000  vancomycin (VANCOCIN) IVPB 1000 mg/200 mL premix  Status:  Discontinued     1,000 mg 200 mL/hr over 60 Minutes Intravenous Every 24 hours 08/02/17 0012 08/07/17 0019   08/02/17 2200  ceFEPIme (MAXIPIME) 1 g in sodium chloride 0.9 % 100 mL IVPB     1 g 200 mL/hr over 30 Minutes Intravenous Every 24 hours 08/02/17 1745 08/08/17 2359   08/02/17 0830  piperacillin-tazobactam (ZOSYN) IVPB 3.375 g  Status:  Discontinued     3.375 g 12.5 mL/hr over 240 Minutes Intravenous Every 8 hours 08/02/17 0801 08/02/17 1716   08/02/17 0800  ceFEPIme (MAXIPIME) 1 g in sodium chloride 0.9 % 100 mL IVPB  Status:  Discontinued     1 g 200 mL/hr over 30 Minutes Intravenous Every 8 hours 08/02/17 0012 08/02/17 0744   08/02/17 0000  ceFEPIme (MAXIPIME) 2 g in sodium chloride 0.9 % 100 mL IVPB     2 g 200 mL/hr over 30 Minutes Intravenous  Once 08/01/17 2352 08/02/17 0136   08/02/17 0000  vancomycin (VANCOCIN) IVPB 1000 mg/200 mL premix     1,000 mg 200 mL/hr over 60 Minutes Intravenous  Once 08/01/17 2352 08/02/17 0206        Objective:   Vitals:   08/08/17 0835 08/08/17 0846 08/08/17 1118 08/08/17 1136  BP:  111/86 107/62 104/66  Pulse:  91 (!) 101 99  Resp:  (!) 27 (!) 24 (!) 22  Temp: 98.8 F (37.1 C)   98.1 F (36.7 C)  TempSrc: Oral   Oral  SpO2:  95% 94% 93%  Weight:      Height:        Wt Readings from Last 3 Encounters:  08/05/17 44.5 kg (98 lb)  07/07/17 40.9 kg (90 lb 3.2 oz)  05/09/17 41.8 kg (92 lb 3.2 oz)     Intake/Output Summary (Last 24 hours) at 08/08/2017 1307 Last data filed at 08/08/2017 0932 Gross per 24 hour  Intake 340  ml  Output 850 ml  Net -510 ml     Physical Exam  Awake Alert, Oriented X 3,  Symmetrical Chest wall movement, CTAB, diminished air entry in the left RRR,No Gallops,Rubs or new Murmurs, No Parasternal Heave +ve B.Sounds, Abd Soft, No  tenderness, No rebound - guarding or rigidity. No Cyanosis, Clubbing or edema, No new Rash or bruise urostomy in right lower abdomen Sacral decubitus ulcer, contracted lower extremities    Data Review:    CBC Recent Labs  Lab 08/01/17 2347  08/03/17 2100 08/04/17 0342 08/05/17 0627 08/07/17 0425 08/08/17 0740  WBC 12.5*   < > 9.4 7.3 7.8 6.8 7.9  HGB 8.0*   < > 9.4* 8.6* 9.4* 8.0* 8.3*  HCT 26.4*   < > 29.2* 26.5* 29.1* 25.5* 26.2*  PLT 487*   < > 422* 342 440* 364 404*  MCV 103.5*   < > 91.8 91.7 91.5 94.8 94.6  MCH 31.4   < > 29.6 29.8 29.6 29.7 30.0  MCHC 30.3   < > 32.2 32.5 32.3 31.4 31.7  RDW 12.0   < > 18.3* 19.3* 18.6* 16.5* 15.5  LYMPHSABS 0.8  --   --   --   --   --   --   MONOABS 0.8  --   --   --   --   --   --   EOSABS 0.1  --   --   --   --   --   --   BASOSABS 0.0  --   --   --   --   --   --    < > = values in this interval not displayed.    Chemistries  Recent Labs  Lab 08/01/17 2347  08/03/17 0427 08/04/17 0342 08/05/17 0627 08/07/17 0425 08/08/17 0740  NA 141   < > 144 147* 144 142 142  K 6.2*   < > 4.1 4.0 4.4 4.2 4.7  CL 103   < > 113* 114* 112* 111 111  CO2 22   < > 22 24 24 24 24   GLUCOSE 137*   < > 101* 116* 75 86 84  BUN 45*   < > 25* 20 21* 15 14  CREATININE 1.87*   < > 1.00 0.78 0.76 0.58 0.52  CALCIUM 7.3*   < > 6.6* 7.8* 7.9* 8.5* 8.8*  AST 174*  --   --   --   --   --   --   ALT 87*  --   --   --   --   --   --   ALKPHOS 627*  --   --   --   --   --   --   BILITOT 1.8*  --   --   --   --   --   --    < > = values in this interval not displayed.   ------------------------------------------------------------------------------------------------------------------ No results for input(s): CHOL, HDL, LDLCALC, TRIG, CHOLHDL, LDLDIRECT in the last 72 hours.  Lab Results  Component Value Date   HGBA1C 5.5 06/24/2016   ------------------------------------------------------------------------------------------------------------------ No  results for input(s): TSH, T4TOTAL, T3FREE, THYROIDAB in the last 72 hours.  Invalid input(s): FREET3 ------------------------------------------------------------------------------------------------------------------ No results for input(s): VITAMINB12, FOLATE, FERRITIN, TIBC, IRON, RETICCTPCT in the last 72 hours.  Coagulation profile Recent Labs  Lab 08/02/17 0002 08/02/17 0652  INR 1.21 1.35    No results for input(s): DDIMER in the last 72 hours.  Cardiac Enzymes No results for input(s): CKMB, TROPONINI, MYOGLOBIN in the last 168 hours.  Invalid input(s): CK ------------------------------------------------------------------------------------------------------------------ No results found for: BNP  Inpatient Medications  Scheduled Meds: . acetylcysteine  4 mL Nebulization Q6H  . bisacodyl  5 mg Oral Daily  . calcium-vitamin D  2 tablet Oral QID  . Chlorhexidine Gluconate Cloth  6 each Topical Q0600  . enoxaparin  20 mg Subcutaneous Q24H  . famotidine  20 mg Oral Daily  . feeding supplement (ENSURE ENLIVE)  237 mL Oral TID BM  . ferrous sulfate  325 mg Oral Q breakfast  . guaiFENesin  600 mg Oral BID  . levothyroxine  50 mcg Oral QAC breakfast  . magnesium oxide  400 mg Oral BID  . midodrine  2.5 mg Oral TID WC  . multivitamin with minerals  1 tablet Oral Daily  . mupirocin ointment  1 application Nasal BID  . polyethylene glycol  17 g Oral Daily  . tiZANidine  2 mg Oral TID  . vitamin B-12  100 mcg Oral Daily  . vitamin C  500 mg Oral Daily   Continuous Infusions: . ceFEPime (MAXIPIME) IV 1 g (08/07/17 2129)   PRN Meds:.acetaminophen **OR** acetaminophen, HYDROcodone-acetaminophen, ondansetron **OR** ondansetron (ZOFRAN) IV  Micro Results Recent Results (from the past 240 hour(s))  Urine culture     Status: Abnormal   Collection Time: 08/02/17 12:01 AM  Result Value Ref Range Status   Specimen Description URINE, CATHETERIZED  Final   Special Requests   Final     Immunocompromised Performed at [facility], [place] [address] [place], [place] [address]    Culture MULTIPLE SPECIES PRESENT, SUGGEST RECOLLECTION (A)  Final   Report Status 08/03/2017 FINAL  Final  Culture, blood (Routine x 2)     Status: None   Collection Time: 08/02/17 12:02 AM  Result Value Ref Range Status   Specimen Description SITE NOT SPECIFIED  Final   Special Requests   Final    BOTTLES DRAWN AEROBIC AND ANAEROBIC Blood Culture results may not be optimal due to an inadequate volume of blood received in culture bottles   Culture   Final    NO GROWTH 5 DAYS Performed at [facility], [place] [address], [place], [place] [address]    Report Status 08/07/2017 FINAL  Final  Culture, blood (Routine x 2)     Status: None   Collection Time: 08/02/17 12:03 AM  Result Value Ref Range Status   Specimen Description BLOOD LEFT WRIST  Final   Special Requests   Final    BOTTLES DRAWN AEROBIC AND ANAEROBIC Blood Culture results may not be optimal due to an excessive volume of blood received in culture bottles   Culture   Final    NO GROWTH 5 DAYS Performed at [facility], [place] [address], [place], [place] [address]    Report Status 08/07/2017 FINAL  Final  MRSA PCR Screening     Status: Abnormal   Collection Time: 08/02/17  7:44 AM  Result Value Ref Range Status   MRSA by PCR POSITIVE (A) NEGATIVE Final    Comment:        The GeneXpert MRSA Assay (FDA approved for NASAL specimens only), is one component of a comprehensive MRSA colonization surveillance program. It is not intended to diagnose MRSA infection nor to guide or monitor treatment for MRSA infections. RESULT CALLED TO, READ BACK BY AND VERIFIED WITH: Berlinda Last RN 12:10 08/02/08 ([email or web address])  Performed at [facility], [place] [address], [place], [place] [address]     Radiology Reports Dg Chest 2 View  Result Date: 08/02/2017 CLINICAL DATA:  42 year old female with fever. EXAM: CHEST - 2 VIEW  COMPARISON:  Chest radiograph dated 03/17/2017 FINDINGS: There is a large left pleural effusion with consolidative changes of the majority of the left lung. Only a small aerated portion of the long noted in the left upper lobe. There is associated volume loss and shift of the mediastinum into the left hemithorax. The right lung is clear. No pneumothorax. No acute osseous pathology. IMPRESSION: Large left pleural effusion with opacification of the left hemithorax as described. CT may provide better evaluation of the size of the pleural effusion and assessment if thoracentesis is clinically considered. Electronically Signed   By: Anner Crete M.D.   On: 08/02/2017 01:23   Ct Chest W Contrast  Result Date: 08/02/2017 CLINICAL DATA:  Recent pneumonia, cough EXAM: CT CHEST WITH CONTRAST TECHNIQUE: Multidetector CT imaging of the chest was performed during intravenous contrast administration. CONTRAST:  27mL OMNIPAQUE IOHEXOL 300 MG/ML  SOLN COMPARISON:  08/02/2017 radiograph, CT chest 08/31/2016 FINDINGS: Cardiovascular: Nonaneurysmal aorta. Normal heart size. No significant pericardial effusion. Mediastinum/Nodes: Midline trachea. No thyroid mass. Borderline mediastinal lymph node, left paratracheal lymph node measures 9 mm. Lungs/Pleura: Small left-sided pleural effusion. Extensive consolidation within the left lower lobe with partial consolidation in the left upper lobe and ground-glass density. No pneumothorax. Similar appearance of volume loss on the left. Upper Abdomen: 5 mm probable stone in the mid right kidney. Musculoskeletal: No chest wall abnormality. No acute or significant osseous findings. IMPRESSION: 1. Small left-sided pleural effusion. Extensive left lower lobe and left upper lobe consolidative process, most consistent with a pneumonia. 2. Nonobstructing stone within the right kidney. Electronically Signed   By: Donavan Foil M.D.   On: 08/02/2017 02:42   Dg Chest Port 1 View  Result Date:  08/08/2017 CLINICAL DATA:  Tachypnea today. EXAM: PORTABLE CHEST 1 VIEW COMPARISON:  Chest x-ray of August 02, 2017 FINDINGS: There is now all but near-total opacification of the left hemithorax with shift of the mediastinum toward the left. The right lung is hyperinflated. There are coarse lung markings in the right infrahilar region more conspicuous today. The cardiac silhouette is obscured. The pulmonary vascularity on the right is not engorged. IMPRESSION: Near-total opacification of the left hemithorax which has worsened over the past 6 days. Given the mediastinal shift toward the left, a central obstructing lesion is suspected. Subtle increased density in the right infrahilar region may reflect atelectasis or pneumonia. Electronically Signed   By: David  Martinique M.D.   On: 08/08/2017 08:02      Phillips Climes M.D on 08/08/2017 at 1:07 PM  Between 7am to 7pm - Pager - [phone]  After 7pm go to [email or web address] - password [facility]  [facility] -  Office  [phone]

## 2017-08-09 ENCOUNTER — Inpatient Hospital Stay (HOSPITAL_COMMUNITY): Payer: Medicare Other

## 2017-08-09 NOTE — Progress Notes (Signed)
Patient is refusing Q2H turning and repositioning schedule. She states her sacral wound is fine and will be healed on day.

## 2017-08-09 NOTE — Care Management Note (Signed)
Case Management Note  Patient Details  Name: Deanna Baldwin MRN: [id number] Date of Birth: 1975/06/25  Subjective/Objective:  Admitted with PNA, hx of multiple sclerosis with paraplegia, large sacral decubitus ulcer, chronic anemia, hypothyroidism.   Action/Plan: Transition back to [facility]/SNF when medically stable....Marland KitchenCSW managing disposition to facility. NCM will continue to monitor.  Expected Discharge Date:                  Expected Discharge Plan:  Skilled Nursing Facility  In-House Referral:  Clinical Social Work  Discharge planning Services     Post Acute Care Choice:    Choice offered to:     DME Arranged:   N/A DME Agency:   N/A  HH Arranged:   N/A HH Agency:   N/A  Status of Service:  In process, will continue to follow  If discussed at Long Length of Stay Meetings, dates discussed:    Additional Comments:  Sharin Mons, RN 08/09/2017, 2:00 PM

## 2017-08-09 NOTE — Progress Notes (Addendum)
Initial Nutrition Assessment  DOCUMENTATION CODES:   Underweight  INTERVENTION:  Continue Ensure Enlive po TID, each supplement provides 350 kcal and 20 grams of protein  Continue MVI, supplementation of micronutrients for wound healing  NUTRITION DIAGNOSIS:   Increased nutrient needs related to wound healing as evidenced by estimated needs.  GOAL:   Patient will meet greater than or equal to 90% of their needs  MONITOR:   PO intake, Supplement acceptance  REASON FOR ASSESSMENT:   Low [place]    ASSESSMENT:   Patient with PMH of multiple sclerosis with paraplegia, large sacral decubitus ulcer, sacral osteomyelitis, chronic anemia, presents with Sepsis secondary to HCAP.  Spoke with Deanna Baldwin at bedside. She is well known to our service. Comes from a SNF this admission. She states that she did not like the food there, but would eat breakfast, and then order food for lunch/dinner. Denies any weight loss.  Was drinking ensure BID there. PO here has been 75-100%, with no complaints. Receiving ensure TID currently to assist with healing of chronic stage IV wounds. NFPE completed, the muscle wasting she exhibits is likely related to being bedbound from multiple sclerosis. Given her intake history this RD does not suspect her to truly be malnourished.  Labs reviewed:  HGB 8.3 Medications reviewed and include:  Ca-Vit D, Iron, Mag-Ox, MVI, Miralax, Vitamin C, Vitamin B12   Intake/Output Summary (Last 24 hours) at 08/09/2017 1625 Last data filed at 08/09/2017 1618 Gross per 24 hour  Intake 470 ml  Output 1300 ml  Net -830 ml  8L Fluid Positive  NUTRITION - FOCUSED PHYSICAL EXAM:    Most Recent Value  Orbital Region  No depletion  Upper Arm Region  No depletion  Thoracic and Lumbar Region  No depletion  Buccal Region  No depletion  Temple Region  No depletion  Clavicle Bone Region  Severe depletion  Clavicle and Acromion Bone Region  Severe depletion  Scapular Bone  Region  Severe depletion  Dorsal Hand  Moderate depletion  Patellar Region  Severe depletion  Anterior Thigh Region  Severe depletion  Posterior Calf Region  Severe depletion       Diet Order:   Diet Order           Diet regular Room service appropriate? Yes; Fluid consistency: Thin  Diet effective now          EDUCATION NEEDS:   Education needs have been addressed  Skin:  Skin Assessment: Skin Integrity Issues: Skin Integrity Issues:: Stage IV Stage IV: to sacrum, to L and R side of ischial tuberosity  Last BM:  08/08/2017  Height:   Ht Readings from Last 1 Encounters:  08/02/17 5\' 9"  (1.753 m)    Weight:   Wt Readings from Last 1 Encounters:  08/05/17 98 lb (44.5 kg)    Ideal Body Weight:  61.84 kg  BMI:  Body mass index is 14.47 kg/m.  Estimated Nutritional Needs:   Kcal:  1600-1900 calories  Protein:  76-89 grams (1.7-2.0g/kg)  Fluid:  >1.5L    Deanna Anis. Caylor Tallarico, MS, RD LDN Inpatient Clinical Dietitian Pager [phone]

## 2017-08-09 NOTE — Progress Notes (Signed)
PROGRESS NOTE                                                                                                                                                                                                             Patient Demographics:    Deanna Baldwin, is a 42 y.o. female, DOB - 13-Jan-1976, ZYS:[id number]  Admit date - 08/01/2017   Admitting Physician Rise Patience, MD  Outpatient Primary MD for the patient is [name], DO  LOS - 7   Chief Complaint  Patient presents with  . Shortness of Breath       Brief Narrative   42 y.o. female with history of multiple sclerosis with paraplegia, large sacral decubitus ulcer, sacral osteomyelitis chronic anemia, hypothyroidism was expressing cough with shortness of breath and productive sputum last 2 days at her living facility.  She was admitted secondary to healthcare associated pneumonia, as well work-up significant for anemia with hemoglobin of 5.9, remained stable after 2 units PRBC transfusion, repeat chest x-ray after 7 days of IV antibiotic treatments showing worsening of left lung pneumonia with total opacification, pulmonary consulted, condition for aggressive chest PT, appears to be improving   Subjective:    Deanna Baldwin today has, No headache, No chest pain, No abdominal pain, and reports improvement of her congestion   Principal Problem:   HCAP (healthcare-associated pneumonia) Active Problems:   Spastic 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 ([place])   Anemia of chronic disease   Sepsis ([place])   Pressure ulcer, stage 4 (H[facility])   Hypothyroidism   Atelectasis   Sepsis secondary to HCAP -Imaging significant for lung pneumonia, antibiotics management per ID, continue with IV vancomycin (MRSA nares +) and cefepime (Zosyn changed to cefepime by ID.  -Antibiotics recommendation were per ID, she already finished 7 days of treatment, but giving chest x-ray findings, antibiotics were  continued, today is day 8. -Patient with complete left hemithorax opacification/consolidation on repeat imaging today, due to poor inspiratory effort with significant atelectasis, with possible mucous plugging, pulmonary were consulted, instructed daily basis to use incentive spirometry and flutter valve, monitoring input appreciated, this is all appears to be secondary to atelectasis and effort, improving with chest PT/wrist, and Mucomyst, . -blood cultures remains with no growth to date -Proving, but she remains slightly tachycardic today, so we will hold on discharge, if  she continues to improve can be discharged tomorrow off antibiotics and recommendation for dose follow-up for chest PT.  Sacral decubitus ulcer -Continue with wound care  Anemia of chronic illness -With anemia with significant hemoglobin drop from 7.1-5.9, this appears to be delusional as all cell lines has been dropped, she was transfused 2 units PRBC with good response, hemoglobin remained stable -Hemoccult negative stool this admission, no evidence of GI bleed or melena, Hemoccult negative -She is with anemia work-up in the past, was significant for anemia of chronic disease, folic acid, G86, ferritin within normal limits, only low iron . -Is with a recent hospitalization in March 2019, required 2 units PRBC transfusion during hospital stay  Hypothyroidism -Continue with Synthroid  Multiple sclerosis -She is following with neurology as an outpatient  Chronic hypotension -With Midodrin, she does have some hypotensive episodes where she is a symptomatic, this is her baseline  Spastic paraplegia -Continue with home regimen including Zanaflex and baclofen  Hypernatremia -Encouraged to increase her fluid intake, has been corrected, will DC half-normal saline     Code Status : Full  Family Communication  : none at bedside  Disposition Plan  : SNF tomorrow if continues to improve  Consults  :  ID  Procedures  :  none  DVT Prophylaxis  :  lovenox  Lab Results  Component Value Date   PLT 404 (H) 08/08/2017    Antibiotics  :    Anti-infectives (From admission, onward)   Start     Dose/Rate Route Frequency Ordered Stop   08/09/17 0000  vancomycin (VANCOCIN) 1,250 mg in sodium chloride 0.9 % 250 mL IVPB     1,250 mg 166.7 mL/hr over 90 Minutes Intravenous Every 24 hours 08/08/17 1316     08/08/17 2200  ceFEPIme (MAXIPIME) 1 g in sodium chloride 0.9 % 100 mL IVPB     1 g 200 mL/hr over 30 Minutes Intravenous Every 24 hours 08/08/17 1316     08/07/17 0019  vancomycin (VANCOCIN) 1,250 mg in sodium chloride 0.9 % 250 mL IVPB     1,250 mg 166.7 mL/hr over 90 Minutes Intravenous Every 24 hours 08/07/17 0019 08/08/17 0222   08/03/17 0000  vancomycin (VANCOCIN) IVPB 1000 mg/200 mL premix  Status:  Discontinued     1,000 mg 200 mL/hr over 60 Minutes Intravenous Every 24 hours 08/02/17 0012 08/07/17 0019   08/02/17 2200  ceFEPIme (MAXIPIME) 1 g in sodium chloride 0.9 % 100 mL IVPB  Status:  Discontinued     1 g 200 mL/hr over 30 Minutes Intravenous Every 24 hours 08/02/17 1745 08/08/17 1316   08/02/17 0830  piperacillin-tazobactam (ZOSYN) IVPB 3.375 g  Status:  Discontinued     3.375 g 12.5 mL/hr over 240 Minutes Intravenous Every 8 hours 08/02/17 0801 08/02/17 1716   08/02/17 0800  ceFEPIme (MAXIPIME) 1 g in sodium chloride 0.9 % 100 mL IVPB  Status:  Discontinued     1 g 200 mL/hr over 30 Minutes Intravenous Every 8 hours 08/02/17 0012 08/02/17 0744   08/02/17 0000  ceFEPIme (MAXIPIME) 2 g in sodium chloride 0.9 % 100 mL IVPB     2 g 200 mL/hr over 30 Minutes Intravenous  Once 08/01/17 2352 08/02/17 0136   08/02/17 0000  vancomycin (VANCOCIN) IVPB 1000 mg/200 mL premix     1,000 mg 200 mL/hr over 60 Minutes Intravenous  Once 08/01/17 2352 08/02/17 0206        Objective:   Vitals:   08/09/17  8299 08/09/17 1128 08/09/17 1337 08/09/17 1440  BP: 107/69 107/69 104/66   Pulse: (!) 109 (!) 108  (!) 110   Resp: (!) 26 (!) 26 (!) 32   Temp: 98.4 F (36.9 C)  99.3 F (37.4 C)   TempSrc: Oral  Oral   SpO2: 94% 93% 94% 94%  Weight:      Height:        Wt Readings from Last 3 Encounters:  08/05/17 44.5 kg (98 lb)  07/07/17 40.9 kg (90 lb 3.2 oz)  05/09/17 41.8 kg (92 lb 3.2 oz)     Intake/Output Summary (Last 24 hours) at 08/09/2017 1608 Last data filed at 08/09/2017 1409 Gross per 24 hour  Intake 470 ml  Output 600 ml  Net -130 ml     Physical Exam  Awake Alert, Oriented X 3, No new F.N deficits, Normal affect Symmetrical Chest wall movement, no wheezing, good air entry in the right y, improved air entry in the left RRR,No Gallops,Rubs or new Murmurs, No Parasternal Heave +ve B.Sounds, Abd Soft, No tenderness, No rebound - guarding or rigidity. Sacral decubitus ulcer, contracted lower extremities    Data Review:    CBC Recent Labs  Lab 08/03/17 2100 08/04/17 0342 08/05/17 0627 08/07/17 0425 08/08/17 0740  WBC 9.4 7.3 7.8 6.8 7.9  HGB 9.4* 8.6* 9.4* 8.0* 8.3*  HCT 29.2* 26.5* 29.1* 25.5* 26.2*  PLT 422* 342 440* 364 404*  MCV 91.8 91.7 91.5 94.8 94.6  MCH 29.6 29.8 29.6 29.7 30.0  MCHC 32.2 32.5 32.3 31.4 31.7  RDW 18.3* 19.3* 18.6* 16.5* 15.5    Chemistries  Recent Labs  Lab 08/03/17 0427 08/04/17 0342 08/05/17 0627 08/07/17 0425 08/08/17 0740  NA 144 147* 144 142 142  K 4.1 4.0 4.4 4.2 4.7  CL 113* 114* 112* 111 111  CO2 22 24 24 24 24   GLUCOSE 101* 116* 75 86 84  BUN 25* 20 21* 15 14  CREATININE 1.00 0.78 0.76 0.58 0.52  CALCIUM 6.6* 7.8* 7.9* 8.5* 8.8*   ------------------------------------------------------------------------------------------------------------------ No results for input(s): CHOL, HDL, LDLCALC, TRIG, CHOLHDL, LDLDIRECT in the last 72 hours.  Lab Results  Component Value Date   HGBA1C 5.5 06/24/2016    ------------------------------------------------------------------------------------------------------------------ No results for input(s): TSH, T4TOTAL, T3FREE, THYROIDAB in the last 72 hours.  Invalid input(s): FREET3 ------------------------------------------------------------------------------------------------------------------ No results for input(s): VITAMINB12, FOLATE, FERRITIN, TIBC, IRON, RETICCTPCT in the last 72 hours.  Coagulation profile No results for input(s): INR, PROTIME in the last 168 hours.  No results for input(s): DDIMER in the last 72 hours.  Cardiac Enzymes No results for input(s): CKMB, TROPONINI, MYOGLOBIN in the last 168 hours.  Invalid input(s): CK ------------------------------------------------------------------------------------------------------------------ No results found for: BNP  Inpatient Medications  Scheduled Meds: . acetylcysteine  4 mL Nebulization Q6H  . bisacodyl  5 mg Oral Daily  . calcium-vitamin D  2 tablet Oral QID  . enoxaparin  20 mg Subcutaneous Q24H  . famotidine  20 mg Oral Daily  . feeding supplement (ENSURE ENLIVE)  237 mL Oral TID BM  . ferrous sulfate  325 mg Oral Q breakfast  . guaiFENesin  600 mg Oral BID  . ipratropium-albuterol  3 mL Nebulization Q6H  . lactobacillus acidophilus  2 tablet Oral TID  . levothyroxine  50 mcg Oral QAC breakfast  . magnesium oxide  400 mg Oral BID  . midodrine  2.5 mg Oral TID WC  . multivitamin with minerals  1 tablet Oral Daily  . polyethylene  glycol  17 g Oral Daily  . tiZANidine  2 mg Oral TID  . vitamin B-12  100 mcg Oral Daily  . vitamin C  500 mg Oral Daily   Continuous Infusions: . ceFEPime (MAXIPIME) IV 1 g (08/08/17 2225)  . vancomycin 1,250 mg (08/09/17 0104)   PRN Meds:.acetaminophen **OR** acetaminophen, HYDROcodone-acetaminophen, ondansetron **OR** ondansetron (ZOFRAN) IV  Micro Results Recent Results (from the past 240 hour(s))  Urine culture     Status:  Abnormal   Collection Time: 08/02/17 12:01 AM  Result Value Ref Range Status   Specimen Description URINE, CATHETERIZED  Final   Special Requests   Final    Immunocompromised Performed at [facility], [place] [address] [place], [place] [address]    Culture MULTIPLE SPECIES PRESENT, SUGGEST RECOLLECTION (A)  Final   Report Status 08/03/2017 FINAL  Final  Culture, blood (Routine x 2)     Status: None   Collection Time: 08/02/17 12:02 AM  Result Value Ref Range Status   Specimen Description SITE NOT SPECIFIED  Final   Special Requests   Final    BOTTLES DRAWN AEROBIC AND ANAEROBIC Blood Culture results may not be optimal due to an inadequate volume of blood received in culture bottles   Culture   Final    NO GROWTH 5 DAYS Performed at [facility], [place] [address], [place], [place] [address]    Report Status 08/07/2017 FINAL  Final  Culture, blood (Routine x 2)     Status: None   Collection Time: 08/02/17 12:03 AM  Result Value Ref Range Status   Specimen Description BLOOD LEFT WRIST  Final   Special Requests   Final    BOTTLES DRAWN AEROBIC AND ANAEROBIC Blood Culture results may not be optimal due to an excessive volume of blood received in culture bottles   Culture   Final    NO GROWTH 5 DAYS Performed at [facility], [place] [address] [place],  [address]    Report Status 08/07/2017 FINAL  Final  MRSA PCR Screening     Status: Abnormal   Collection Time: 08/02/17  7:44 AM  Result Value Ref Range Status   MRSA by PCR POSITIVE (A) NEGATIVE Final    Comment:        The GeneXpert MRSA Assay (FDA approved for NASAL specimens only), is one component of a comprehensive MRSA colonization surveillance program. It is not intended to diagnose MRSA infection nor to guide or monitor treatment for MRSA infections. RESULT CALLED TO, READ BACK BY AND VERIFIED WITH: Berlinda Last RN 12:10 08/02/08 ([email or web address]) Performed at [facility], [place] [address],  [place],  [address]     Radiology Reports Dg Chest 2 View  Result Date: 08/02/2017 CLINICAL DATA:  42 year old female with fever. EXAM: CHEST - 2 VIEW COMPARISON:  Chest radiograph dated 03/17/2017 FINDINGS: There is a large left pleural effusion with consolidative changes of the majority of the left lung. Only a small aerated portion of the long noted in the left upper lobe. There is associated volume loss and shift of the mediastinum into the left hemithorax. The right lung is clear. No pneumothorax. No acute osseous pathology. IMPRESSION: Large left pleural effusion with opacification of the left hemithorax as described. CT may provide better evaluation of the size of the pleural effusion and assessment if thoracentesis is clinically considered. Electronically Signed   By: Anner Crete M.D.   On: 08/02/2017 01:23   Ct  Chest W Contrast  Result Date: 08/02/2017 CLINICAL DATA:  Recent pneumonia, cough EXAM: CT CHEST WITH CONTRAST TECHNIQUE: Multidetector CT imaging of the chest was performed during intravenous contrast administration. CONTRAST:  40mL OMNIPAQUE IOHEXOL 300 MG/ML  SOLN COMPARISON:  08/02/2017 radiograph, CT chest 08/31/2016 FINDINGS: Cardiovascular: Nonaneurysmal aorta. Normal heart size. No significant pericardial effusion. Mediastinum/Nodes: Midline trachea. No thyroid mass. Borderline mediastinal lymph node, left paratracheal lymph node measures 9 mm. Lungs/Pleura: Small left-sided pleural effusion. Extensive consolidation within the left lower lobe with partial consolidation in the left upper lobe and ground-glass density. No pneumothorax. Similar appearance of volume loss on the left. Upper Abdomen: 5 mm probable stone in the mid right kidney. Musculoskeletal: No chest wall abnormality. No acute or significant osseous findings. IMPRESSION: 1. Small left-sided pleural effusion. Extensive left lower lobe and left upper lobe consolidative process, most consistent with a pneumonia. 2.  Nonobstructing stone within the right kidney. Electronically Signed   By: Donavan Foil M.D.   On: 08/02/2017 02:42   Dg Chest Port 1 View  Result Date: 08/09/2017 CLINICAL DATA:  Respiratory failure.  Short of breath. EXAM: PORTABLE CHEST 1 VIEW COMPARISON:  08/08/2017 FINDINGS: There is now moderate aeration in the left upper lobe. There is continued shift of the mediastinum to the left and opacity at the left base consistent with persistent left lower lobe collapse. Right lung is clear. Normal heart size. No pneumothorax. Left pleural effusion is noted. IMPRESSION: There is improved aeration in the left upper lobe but there is persistent volume loss in the left lung. Electronically Signed   By: Marybelle Killings M.D.   On: 08/09/2017 08:38   Dg Chest Port 1 View  Result Date: 08/08/2017 CLINICAL DATA:  Tachypnea today. EXAM: PORTABLE CHEST 1 VIEW COMPARISON:  Chest x-ray of August 02, 2017 FINDINGS: There is now all but near-total opacification of the left hemithorax with shift of the mediastinum toward the left. The right lung is hyperinflated. There are coarse lung markings in the right infrahilar region more conspicuous today. The cardiac silhouette is obscured. The pulmonary vascularity on the right is not engorged. IMPRESSION: Near-total opacification of the left hemithorax which has worsened over the past 6 days. Given the mediastinal shift toward the left, a central obstructing lesion is suspected. Subtle increased density in the right infrahilar region may reflect atelectasis or pneumonia. Electronically Signed   By: David  Martinique M.D.   On: 08/08/2017 08:02   Dg Swallowing Func-speech Pathology  Result Date: 08/08/2017 Objective Swallowing Evaluation: Type of Study: MBS-Modified Barium Swallow Study  Patient Details Name: Deanna Baldwin MRN: [id number] Date of Birth: 04-02-1975 Today's Date: 08/08/2017 Time: SLP Start Time (ACUTE ONLY): 0930 -SLP Stop Time (ACUTE ONLY): 0946 SLP Time Calculation (min) (ACUTE  ONLY): 16 min Past Medical History: Past Medical History: Diagnosis Date . Buttock wound 03/03/2016 . Dysrhythmia   tachycardia . MS (multiple sclerosis) ([place])  . Neutropenia ([place])  . Pneumonia 02/2016 . Protein calorie malnutrition ([place])  . Sacral osteomyelitis ([place]) 05/19/2017 . Severe sepsis ([place]) 03/03/2016 . Spastic 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 ([place])  . UTI (urinary tract infection) 02/2016 Past Surgical History: Past Surgical History: Procedure Laterality Date . DIVERTING ILEOSTOMY N/A 09/03/2016  Procedure: ILEAL CONDUIT  URINARY DIVERSION OPEN;  Surgeon: Ardis Hughs, MD;  Location: [facility];  Service: Urology;  Laterality: N/A; HPI: 42 year old female admitted 08/01/17 with sepsis. PMH: MS with spastic paraplegia, PNA, silent aspiration. CXR = LLL/LUL consolidative process c/w PNA, left  pleural effusion  Subjective: pt says she advanced to thin liquids since prior MBS but also denies any other objective testing Assessment / Plan / Recommendation CHL IP CLINICAL IMPRESSIONS 08/04/2017 Clinical Impression Pt presents with a mild oropharyngeal dysphagia due to positioning, timing, and reduced sensation. She has intermittent premature spillage of thin liquids, particularly with larger boluses, which silently spills into her airway. Thin liquids also appear to enter the laryngeal vestibule posteriorly, which makes me also question the integrity of her vocal fold adduction, particularly in light of her dysphonia. Min cues were provided for a chin tuck, which helped provide better oral containement and therefore better airway protection. No other consistencies entered the airway, clearing the pharynx with good efficiency as well. Of note, attempted to test nectar thick liquids in case pt was not able to consistently use a chin tuck, but she continued to use a chin tuck despite being cued not to do so. Recommend continuing regular diet textures and thin liquids with use of a chin tuck for all liquids.  SLP will f/u for tolerance and utilization of precautions.  SLP Visit Diagnosis Dysphagia, oropharyngeal phase (R13.12) Attention and concentration deficit following -- Frontal lobe and executive function deficit following -- Impact on safety and function --   CHL IP TREATMENT RECOMMENDATION 08/03/2017 Treatment Recommendations Therapy as outlined in treatment plan below   Prognosis 08/03/2017 Prognosis for Safe Diet Advancement Fair Barriers to Reach Goals Time post onset Barriers/Prognosis Comment -- CHL IP DIET RECOMMENDATION 08/04/2017 SLP Diet Recommendations -- Liquid Administration via -- Medication Administration -- Compensations Slow rate;Small sips/bites;Chin tuck;Use straw to facilitate chin tuck Postural Changes --   CHL IP OTHER RECOMMENDATIONS 08/03/2017 Recommended Consults -- Oral Care Recommendations Oral care BID Other Recommendations --   CHL IP FOLLOW UP RECOMMENDATIONS 08/04/2017 Follow up Recommendations Skilled Nursing facility   [facility] IP FREQUENCY AND DURATION 08/03/2017 Speech Therapy Frequency (ACUTE ONLY) min 2x/week Treatment Duration 2 weeks      CHL IP ORAL PHASE 08/03/2017 Oral Phase Impaired Oral - Pudding Teaspoon -- Oral - Pudding Cup -- Oral - Honey Teaspoon -- Oral - Honey Cup -- Oral - Nectar Teaspoon -- Oral - Nectar Cup WFL Oral - Nectar Straw -- Oral - Thin Teaspoon -- Oral - Thin Cup Premature spillage Oral - Thin Straw Premature spillage;Other (Comment) Oral - Puree WFL Oral - Mech Soft WFL Oral - Regular -- Oral - Multi-Consistency -- Oral - Pill -- Oral Phase - Comment --  CHL IP PHARYNGEAL PHASE 08/03/2017 Pharyngeal Phase Impaired Pharyngeal- Pudding Teaspoon -- Pharyngeal -- Pharyngeal- Pudding Cup -- Pharyngeal -- Pharyngeal- Honey Teaspoon -- Pharyngeal -- Pharyngeal- Honey Cup -- Pharyngeal -- Pharyngeal- Nectar Teaspoon -- Pharyngeal -- Pharyngeal- Nectar Cup [facility];Other (Comment) Pharyngeal -- Pharyngeal- Nectar Straw -- Pharyngeal -- Pharyngeal- Thin Teaspoon -- Pharyngeal --  Pharyngeal- Thin Cup Reduced airway/laryngeal closure;Penetration/Aspiration before swallow Pharyngeal Material enters airway, passes BELOW cords without attempt by patient to eject out (silent aspiration) Pharyngeal- Thin Straw Compensatory strategies attempted (with notebox);Other (Comment) Pharyngeal -- Pharyngeal- Puree WFL Pharyngeal -- Pharyngeal- Mechanical Soft WFL Pharyngeal -- Pharyngeal- Regular -- Pharyngeal -- Pharyngeal- Multi-consistency -- Pharyngeal -- Pharyngeal- Pill -- Pharyngeal -- Pharyngeal Comment --  CHL IP CERVICAL ESOPHAGEAL PHASE 08/03/2017 Cervical Esophageal Phase WFL Pudding Teaspoon -- Pudding Cup -- Honey Teaspoon -- Honey Cup -- Nectar Teaspoon -- Nectar Cup -- Nectar Straw -- Thin Teaspoon -- Thin Cup -- Thin Straw -- Puree -- Mechanical Soft -- Regular -- Multi-consistency -- Pill --  Cervical Esophageal Comment -- No flowsheet data found. [name] 08/08/2017, 4:00 PM    [name], M.A. CCC-SLP [phone]              Phillips Climes M.D on 08/09/2017 at 4:08 PM  Between 7am to 7pm - Pager - [phone]  After 7pm go to [email or web address] - password [facility]  [facility] -  Office  [phone]

## 2017-08-10 ENCOUNTER — Inpatient Hospital Stay (HOSPITAL_COMMUNITY): Payer: Medicare Other

## 2017-08-10 DIAGNOSIS — A419 Sepsis, unspecified organism: Principal | ICD-10-CM

## 2017-08-10 DIAGNOSIS — D638 Anemia in other chronic diseases classified elsewhere: Secondary | ICD-10-CM

## 2017-08-10 DIAGNOSIS — L89004 Pressure ulcer of unspecified elbow, stage 4: Secondary | ICD-10-CM

## 2017-08-10 DIAGNOSIS — G35 Multiple sclerosis: Secondary | ICD-10-CM

## 2017-08-10 DIAGNOSIS — G8220 Paraplegia, unspecified: Secondary | ICD-10-CM

## 2017-08-10 DIAGNOSIS — E039 Hypothyroidism, unspecified: Secondary | ICD-10-CM

## 2017-08-10 MED ORDER — IPRATROPIUM-ALBUTEROL 0.5-2.5 (3) MG/3ML IN SOLN
3.0000 mL | Freq: Three times a day (TID) | RESPIRATORY_TRACT | Status: DC
  Administered 2017-08-10 – 2017-08-11 (×4): 3 mL via RESPIRATORY_TRACT
  Filled 2017-08-10 (×4): qty 3

## 2017-08-10 MED ORDER — ACETYLCYSTEINE 20 % IN SOLN
4.0000 mL | Freq: Three times a day (TID) | RESPIRATORY_TRACT | Status: DC
  Administered 2017-08-10 – 2017-08-11 (×4): 4 mL via RESPIRATORY_TRACT
  Filled 2017-08-10 (×5): qty 4

## 2017-08-10 NOTE — Progress Notes (Addendum)
I discussed / reviewed the pharmacy note by Dr. Marc Morgans and I agree with the resident's findings and plans as documented.  Deanna Baldwin, RPh Clinical Pharmacist [phone]   Pharmacy Antibiotic Note  Deanna Baldwin is a 42 y.o. female admitted on 08/01/2017 with pneumonia.  Pharmacy has been consulted for Vancomycin dosing. Patient originally scheduled to be on antibiotic therapy for 7 days, but due to repeat chest x-ray showing worsening of left lung pneumonia with total opacification after 7-day antibiotic course, patient continued on antibiotic therapy. Currently on day 9 of antibiotic therapy. Patient remains afebrile, WBC WNL, and renal function is stable, UOP 1.2 mL/kg/hr. Will hold off ordering repeat trough levels since patient is planning to be discharged today.    7/2 Blood x 2 - negative (final) 7/2 Urine -  mult sp 7/2 MRSA PCR - positive  7/2 Strep Ag - negative  Vancomycin trough on 7/6 at 2323 just below goal at 14.  Plan: - Continue vancomycin dose 1250 mg IV q24h - Continue cefepime 1g IV q24h - If patient not d/c home on 7/10, will order BMet for 7/11 AM  Height: 5\' 9"  (175.3 cm)(taken from 07/07/17) Weight: 98 lb (44.5 kg) IBW/kg (Calculated) : 66.2  Temp (24hrs), Avg:99 F (37.2 C), Min:98.1 F (36.7 C), Max:99.7 F (37.6 C)  Recent Labs  Lab 08/03/17 2100 08/04/17 0342 08/04/17 2258 08/05/17 0627 08/06/17 2323 08/07/17 0425 08/08/17 0740  WBC 9.4 7.3  --  7.8  --  6.8 7.9  CREATININE  --  0.78  --  0.76  --  0.58 0.52  VANCOTROUGH  --   --  17  --  14*  --   --     Estimated Creatinine Clearance: 65 mL/min (by C-G formula based on SCr of 0.52 mg/dL).    No Known Allergies   Deanna Baldwin 08/10/2017 11:31 AM

## 2017-08-10 NOTE — Progress Notes (Signed)
  Speech Language Pathology Treatment: Dysphagia  Patient Details Name: Vidhi Delellis MRN: [id number] DOB: 26-Aug-1975 Today's Date: 08/10/2017 Time: 5726-2035 SLP Time Calculation (min) (ACUTE ONLY): 20 min  Assessment / Plan / Recommendation Clinical Impression  Continues to require intermittent cues to carry out chin tuck.  Reviewed its value in minimizing aspiration, results of MBS.  Pt improved reliability in executing chin tuck after review with initial cues needed and appropriate use thereafter. CXR today shows improvements.  Pt for possible D/C today back to SNF - she should continue precautions post-discharge.  HPI HPI: 42 year old female admitted 08/01/17 with sepsis. PMH: MS with spastic paraplegia, PNA, silent aspiration. CXR = LLL/LUL consolidative process c/w PNA, left pleural effusion       SLP Plan  Continue with current plan of care       Recommendations  Diet recommendations: Regular;Thin liquid Liquids provided via: Straw Medication Administration: Whole meds with puree Supervision: Patient able to self feed;Intermittent supervision to cue for compensatory strategies Compensations: Slow rate;Small sips/bites;Chin tuck;Use straw to facilitate chin tuck Postural Changes and/or Swallow Maneuvers: Seated upright 90 degrees                Oral Care Recommendations: Oral care BID Follow up Recommendations: Skilled Nursing facility SLP Visit Diagnosis: Dysphagia, oropharyngeal phase (R13.12) Plan: Continue with current plan of care       GO                Juan Quam Laurice 08/10/2017, 12:15 PM  Joane Postel L. Tivis Ringer, [place] CCC/SLP Pager [phone]

## 2017-08-10 NOTE — Progress Notes (Signed)
Patient ding better and coughing up good amount of sputum with and post treatments. Changed patient per protocol to TID rate of treatments and Mucomyst and working with flutter valve in between as tolerated. Will continue to monitor progress as per protocol and patient will be reassessed tomorrow.

## 2017-08-10 NOTE — Progress Notes (Signed)
PROGRESS NOTE  Deanna Baldwin  BJS:[id number] DOB: [birth year] DOA: 08/01/2017 PCP: [name], DO   Brief Narrative: Deanna Baldwin is a 42 y.o. female with a history of MS with paraplegia complicated by sacral decubitus ulcer and sacral osteomyelitis, neurogenic bladder with chronic urostomy, anemia of chronic disease, and hypothyroidism who presented to the ED with fever, productive cough found to have left-sided multilobular HCAP. Hgb was 5.9, improved with 2u PRBCs 7/3. Infectious disease specialist was consulted, directed antibiotics with vancomycin, cefepime for a total of 7 days after which CXR showed worsening opacification of left hemithorax. PCCM was consulted and recommended aggressive chest PT, mucolytics, etc. and the patient has shown some improvement. Antibiotics are continued.   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  - s/p vancomycin/cefepime 7/1 - 7/9 with ongoing consolidation. Continue IV antibiotics today. - Now with hemithorax opacification due to atelectasis, mucous plugging (predisposed to this due to MS, paraplegia). Continue aggressive chest PT, flutter valve, incentive spirometry, mucolytics - Continuing mucomyst for 24 hours more  MS with spastic paraplegia:  - Continue home medications including baclfen, tixanidine, and outpatient neurology follow up  Stage 4 sacral pressure ulcer and ulcers to right and left ischium: POA  Sacrum: 2cm x3cm x 0.1cm; 100% pink, pale, clean Right ischium: 2cm x2cm x 0.1cm; 100% clean, pale, pink Left ischium: 5cm x 6.5cm x 0.3cm; 95% pale pink, moist/5% fibrinous material along proximal wound edges with epibole of the wound edges  - Offload as able - Continue  local wound care  Anemia of chronic disease: FOBT negative. Normal B12, folate, ferritin.  - Monitor hgb following transfusion on 7/3.   Hypothyroidism:  - Continue synthroid at current dose. Last TSH was 37, but currently hospitalized for sepsis, so will defer repeat TFTs to outpatient setting.    Chronic hypotension:  - Continue midodrine  DVT prophylaxis: Lovenox Code Status: Full Family Communication: None at bedside Disposition Plan: SNF once improved respiratory status. Unable to pursue chest PT at SNF so will continue here pending clinical improvement (probably in next 24-48hrs)  Consultants:   PCCM, ID  Procedures:   None  Antimicrobials:  Vancomycin 7/1 >>   Zosyn 7/2  Cefepime 7/1 >>    Subjective: Breathing is improved dramatically over past 48 hours, chest PT allowing significant deep sputum production. Using flutter valve consistently. No fever.   Objective: Vitals:   08/10/17 0138 08/10/17 0445 08/10/17 0501 08/10/17 0809  BP: 96/68  105/66   Pulse: (!) 106  (!) 105   Resp: 18  (!) 29   Temp:  98.7 F (37.1 C) 99.2 F (37.3 C)   TempSrc:  Oral    SpO2: 99%  94% 95%  Weight:      Height:        Intake/Output Summary (Last 24 hours) at 08/10/2017 1312 Last data filed at 08/10/2017 0500 Gross per 24 hour  Intake 470 ml  Output 1325 ml  Net -855 ml   Filed Weights   08/01/17 2343 08/05/17 0500  Weight: 41.3 kg (91 lb) 44.5 kg (98 lb)    Gen: 42 y.o. female in no distress Pulm: Non-labored breathing. Nearly no air entry in left lower zone and decreased mid lung zone. No  crackles or wheezes.  CV: Regular rate and rhythm. No murmur, rub, or gallop. No JVD, no pedal edema. GI: Abdomen soft, non-tender, non-distended, with normoactive bowel sounds. No organomegaly or masses felt. Ext: Warm, flexion contracture of lower extremities, diffuse muscle atrophy Skin: Sacral decubitus ulcer not examined today. Neuro: Alert and oriented. No new focal  neurological deficits. Psych: Judgement and insight appear normal. Mood & affect appropriate.   Data Reviewed: I have personally reviewed following labs and imaging studies  CBC: Recent Labs  Lab 08/03/17 2100 08/04/17 0342 08/05/17 0627 08/07/17 0425 08/08/17 0740  WBC 9.4 7.3 7.8 6.8 7.9  HGB 9.4* 8.6* 9.4* 8.0* 8.3*  HCT 29.2* 26.5* 29.1* 25.5* 26.2*  MCV 91.8 91.7 91.5 94.8 94.6  PLT 422* 342 440* 364 109*   Basic Metabolic Panel: Recent Labs  Lab 08/04/17 0342 08/05/17 0627 08/07/17 0425 08/08/17 0740  NA 147* 144 142 142  K 4.0 4.4 4.2 4.7  CL 114* 112* 111 111  CO2 24 24 24 24   GLUCOSE 116* 75 86 84  BUN 20 21* 15 14  CREATININE 0.78 0.76 0.58 0.52  CALCIUM 7.8* 7.9* 8.5* 8.8*   GFR: Estimated Creatinine Clearance: 65 mL/min (by C-G formula based on SCr of 0.52 mg/dL). Liver Function Tests: No results for input(s): AST, ALT, ALKPHOS, BILITOT, PROT, ALBUMIN in the last 168 hours. No results for input(s): LIPASE, AMYLASE in the last 168 hours. No results for input(s): AMMONIA in the last 168 hours. Coagulation Profile: No results for input(s): INR, PROTIME in the last 168 hours. Cardiac Enzymes: No results for input(s): CKTOTAL, CKMB, CKMBINDEX, TROPONINI in the last 168 hours. BNP (last 3 results) No results for input(s): PROBNP in the last 8760 hours. HbA1C: No results for input(s): HGBA1C in the last 72 hours. CBG: No results for input(s): GLUCAP in the last 168 hours. Lipid Profile: No results for input(s): CHOL, HDL, LDLCALC, TRIG, CHOLHDL, LDLDIRECT in the last 72 hours. Thyroid Function Tests: No results for input(s): TSH, T4TOTAL, FREET4, T3FREE, THYROIDAB in the last 72 hours. Anemia Panel: No results for input(s): VITAMINB12, FOLATE, FERRITIN, TIBC, IRON, RETICCTPCT in the last 72 hours. Urine analysis:    Component Value Date/Time   COLORURINE AMBER (A) 08/02/2017 0001   APPEARANCEUR CLOUDY (A) 08/02/2017 0001   LABSPEC 1.013 08/02/2017  0001   PHURINE 9.0 (H) 08/02/2017 0001   GLUCOSEU NEGATIVE 08/02/2017 0001   HGBUR NEGATIVE 08/02/2017 0001   BILIRUBINUR NEGATIVE 08/02/2017 0001   KETONESUR NEGATIVE 08/02/2017 0001   PROTEINUR 100 (A) 08/02/2017 0001   UROBILINOGEN 0.2 06/25/2008 1551   NITRITE NEGATIVE 08/02/2017 0001   LEUKOCYTESUR LARGE (A) 08/02/2017 0001   Recent Results (from the past 240 hour(s))  Urine culture     Status: Abnormal   Collection Time: 08/02/17 12:01 AM  Result Value Ref Range Status   Specimen Description URINE, CATHETERIZED  Final   Special Requests   Final    Immunocompromised Performed at [facility], [place] [address], [place], [place] [address]    Culture MULTIPLE SPECIES PRESENT, SUGGEST RECOLLECTION (A)  Final   Report Status 08/03/2017 FINAL  Final  Culture, blood (Routine x 2)     Status: None   Collection Time: 08/02/17 12:02 AM  Result Value Ref Range Status   Specimen Description SITE NOT SPECIFIED  Final   Special Requests   Final    BOTTLES DRAWN AEROBIC AND ANAEROBIC Blood Culture results may not be optimal due to an inadequate volume  of blood received in culture bottles   Culture   Final    NO GROWTH 5 DAYS Performed at [facility], [place] [address] [place], [place] [address]    Report Status 08/07/2017 FINAL  Final  Culture, blood (Routine x 2)     Status: None   Collection Time: 08/02/17 12:03 AM  Result Value Ref Range Status   Specimen Description BLOOD LEFT WRIST  Final   Special Requests   Final    BOTTLES DRAWN AEROBIC AND ANAEROBIC Blood Culture results may not be optimal due to an excessive volume of blood received in culture bottles   Culture   Final    NO GROWTH 5 DAYS Performed at [facility], [place] [address], [place], [place] [address]    Report Status 08/07/2017 FINAL  Final  MRSA PCR Screening     Status: Abnormal   Collection Time: 08/02/17  7:44 AM  Result Value Ref Range Status   MRSA by PCR POSITIVE (A) NEGATIVE Final     Comment:        The GeneXpert MRSA Assay (FDA approved for NASAL specimens only), is one component of a comprehensive MRSA colonization surveillance program. It is not intended to diagnose MRSA infection nor to guide or monitor treatment for MRSA infections. RESULT CALLED TO, READ BACK BY AND VERIFIED WITH: Berlinda Last RN 12:10 08/02/08 ([email or web address]) Performed at [facility], [place] [address], [place], [place] [address]       Radiology Studies: Dg Chest Port 1 View  Result Date: 08/10/2017 CLINICAL DATA:  Shortness of breath EXAM: PORTABLE CHEST 1 VIEW COMPARISON:  August 09, 2017 FINDINGS: There is airspace consolidation in the left lower lobe with moderate left pleural effusion. The right lung is clear. The heart size and pulmonary vascularity are normal. No adenopathy. No evident bone lesions. IMPRESSION: Left lower lobe airspace consolidation with moderate left pleural effusion. Right lung clear. Stable cardiac silhouette. No evident adenopathy. Electronically Signed   By: Lowella Grip III M.D.   On: 08/10/2017 07:34   Dg Chest Port 1 View  Result Date: 08/09/2017 CLINICAL DATA:  Respiratory failure.  Short of breath. EXAM: PORTABLE CHEST 1 VIEW COMPARISON:  08/08/2017 FINDINGS: There is now moderate aeration in the left upper lobe. There is continued shift of the mediastinum to the left and opacity at the left base consistent with persistent left lower lobe collapse. Right lung is clear. Normal heart size. No pneumothorax. Left pleural effusion is noted. IMPRESSION: There is improved aeration in the left upper lobe but there is persistent volume loss in the left lung. Electronically Signed   By: Marybelle Killings M.D.   On: 08/09/2017 08:38    Scheduled Meds: . acetylcysteine  4 mL Nebulization TID  . bisacodyl  5 mg Oral Daily  . calcium-vitamin D  2 tablet Oral QID  . enoxaparin  20 mg Subcutaneous Q24H  . famotidine  20 mg Oral Daily  . feeding supplement (ENSURE ENLIVE)  237 mL  Oral TID BM  . ferrous sulfate  325 mg Oral Q breakfast  . guaiFENesin  600 mg Oral BID  . ipratropium-albuterol  3 mL Nebulization TID  . lactobacillus acidophilus  2 tablet Oral TID  . levothyroxine  50 mcg Oral QAC breakfast  . magnesium oxide  400 mg Oral BID  . midodrine  2.5 mg Oral TID WC  . multivitamin with minerals  1 tablet Oral Daily  . polyethylene glycol  17 g Oral Daily  . tiZANidine  2 mg Oral TID  . vitamin B-12  100 mcg Oral Daily  . vitamin C  500 mg Oral Daily   Continuous Infusions: . ceFEPime (MAXIPIME) IV 1 g (08/09/17 2244)  . vancomycin 1,250 mg (08/09/17 2328)     LOS: 8 days   Time spent: 25 minutes.  Patrecia Pour, MD [facility] [email or web address] Password [email or web address] 08/10/2017, 1:12 PM

## 2017-08-10 NOTE — Consult Note (Signed)
PULMONARY / CRITICAL CARE MEDICINE  Name: Deanna Baldwin MRN: [id number] DOB: [birth year]    ADMISSION DATE:  08/01/2017 CONSULTATION DATE:  08/08/17  REFERRING MD :  Elgergawy  CHIEF COMPLAINT:  SOB   HISTORY OF PRESENT ILLNESS:  Deanna Baldwin is a 42 y.o. female with a PMH as outlined below including MS with paraplegia who lives in a facility.  She was admitted 08/02/17 with productive cough, SOB, fevers, chills.  Roommate had also been sick and was diagnosed with PNA.  In ED, she had CT scan that demonstrated multilobar PNA with extensive LL and LUL consolidation.  She was started on empiric HCAP coverage.  Due to her PNA, urostomy, multiple skin wounds / decub ulcers, ID was consulted 7/2.  Abx therapy was changed from vanc / zosyn to vanc / cefepime (MRSA screen positive).  She was also found to have hypotension but it was noted that she is chronically hypotensive.  CXR 7/8 showed near total opacification of left hemithorax (minimal change from admit CXR).  PCCM was subsequently consulted to assist with management.         SUBJECTIVE: Awake alert no acute distress.  VITAL SIGNS: Temp:  [98.1 F (36.7 C)-99.7 F (37.6 C)] 99.2 F (37.3 C) (07/10 0501) Pulse Rate:  [88-119] 105 (07/10 0501) Resp:  [18-32] 29 (07/10 0501) BP: (96-107)/(61-70) 105/66 (07/10 0501) SpO2:  [93 %-99 %] 95 % (07/10 0809)  PHYSICAL EXAMINATION: General: 42 year old cachectic female in no acute distress HEENT: No JVD or lymphadenopathy is appreciated. Neuro: Awake alert acute distress CV: s1s2 rrr, no m/r/g PULM: even/non-labored, lungs bilaterally coarse rhonchi noted bilaterally decreased breath sounds left base BJ:SEGB, non-tender, bsx4 active  Extremities: warm/dry, negative edema  Skin: no rashes or lesions   Intake/Output Summary (Last 24 hours) at 08/10/2017 1145 Last data filed at 08/10/2017 0500 Gross per 24 hour  Intake 470 ml  Output 1325 ml  Net -855 ml     Recent Labs  Lab  08/05/17 0627 08/07/17 0425 08/08/17 0740  NA 144 142 142  K 4.4 4.2 4.7  CL 112* 111 111  CO2 24 24 24   BUN 21* 15 14  CREATININE 0.76 0.58 0.52  GLUCOSE 75 86 84   Recent Labs  Lab 08/05/17 0627 08/07/17 0425 08/08/17 0740  HGB 9.4* 8.0* 8.3*  HCT 29.1* 25.5* 26.2*  WBC 7.8 6.8 7.9  PLT 440* 364 404*   Dg Chest Port 1 View  Result Date: 08/10/2017 CLINICAL DATA:  Shortness of breath EXAM: PORTABLE CHEST 1 VIEW COMPARISON:  August 09, 2017 FINDINGS: There is airspace consolidation in the left lower lobe with moderate left pleural effusion. The right lung is clear. The heart size and pulmonary vascularity are normal. No adenopathy. No evident bone lesions. IMPRESSION: Left lower lobe airspace consolidation with moderate left pleural effusion. Right lung clear. Stable cardiac silhouette. No evident adenopathy. Electronically Signed   By: Lowella Grip III M.D.   On: 08/10/2017 07:34   Dg Chest Port 1 View  Result Date: 08/09/2017 CLINICAL DATA:  Respiratory failure.  Short of breath. EXAM: PORTABLE CHEST 1 VIEW COMPARISON:  08/08/2017 FINDINGS: There is now moderate aeration in the left upper lobe. There is continued shift of the mediastinum to the left and opacity at the left base consistent with persistent left lower lobe collapse. Right lung is clear. Normal heart size. No pneumothorax. Left pleural effusion is noted. IMPRESSION: There is improved aeration in the left upper lobe but there is persistent volume  loss in the left lung. Electronically Signed   By: Marybelle Killings M.D.   On: 08/09/2017 08:38    STUDIES:  CT chest 7/2 > small left effusion, extensive LLL and LUL consolidation.  Non-obstructing right kidney stone. CXR 7/8 > near total opacification of left hemithorax with ipsilateral mediastinal shift. 08/10/2017 chest x-ray improved with left airspace disease noted  SIGNIFICANT EVENTS  7/2 > admit, ID consult. 7/8 > PCCM consult.  ASSESSMENT / PLAN:  HCAP. Complete  left hemithorax opacification / consolidation - presumed due to above with significant atelectasis and possible mucus plugging also contributing (had CT chest 7/2). Plan: 08/10/2017 much improved post vibra vest mucolytic's pulmonary hygiene. Reports breathing better Continue antimicrobial therapy No new culture data available Currently on Mucomyst most likely can stop near future Due to her underlying illnesses mucous plugging will be an issue and pulmonary toilet will have to be maintained.  Rest per primary team.   Richardson Landry Minor ACNP Maryanna Shape PCCM Pager [phone] till 1 pm If no answer page [phone] 08/10/2017, 11:40 AM

## 2017-08-11 DIAGNOSIS — E43 Unspecified severe protein-calorie malnutrition: Secondary | ICD-10-CM

## 2017-08-11 MED ORDER — IPRATROPIUM-ALBUTEROL 0.5-2.5 (3) MG/3ML IN SOLN: 3.0000 mL | Freq: Two times a day (BID) | RESPIRATORY_TRACT | Status: DC

## 2017-08-11 MED ORDER — HYDROCODONE-ACETAMINOPHEN 5-325 MG PO TABS: 1.0000 | ORAL_TABLET | Freq: Four times a day (QID) | ORAL | 0 refills | Status: DC | PRN

## 2017-08-11 MED ORDER — ACETYLCYSTEINE 20 % IN SOLN: 4.0000 mL | Freq: Two times a day (BID) | RESPIRATORY_TRACT | Status: DC

## 2017-08-11 NOTE — Progress Notes (Addendum)
Patient will DC to: [place] Anticipated DC date: 08/11/17 Family notified: Patient declined and will notify family Transport by: Corey Harold    Per MD patient ready for DC to [facility]. RN, patient, patient's family, and facility notified of DC. Discharge Summary sent to facility. RN given number for report [phone] Room 215A). DC packet on chart. Ambulance transport requested for patient.   CSW signing off.  Cedric Fishman, LCSW Clinical Social Worker [phone]

## 2017-08-11 NOTE — Discharge Summary (Signed)
Physician Discharge Summary  Deanna Baldwin DXI:[id number] DOB: December 24, 1975 DOA: 08/01/2017  PCP: Deanna Cranker, DO  Admit date: 08/01/2017 Discharge date: 08/11/2017  Admitted From: SNF Disposition: SNF   Recommendations for Outpatient Follow-up:  1. Follow up with PCP in 1-2 weeks 2. Continue aggressive pulmonary toilet, repeat CXR in 1 week. 3. Recommend recheck TSH outside the scope of acute illness.   Home Health: N/A Equipment/Devices: Per SNF Discharge Condition: Stable CODE STATUS: Full Diet recommendation: As tolerated  Brief/Interim Summary: Deanna Baldwin is a 42 y.o. female with a history of MS with paraplegia complicated by sacral decubitus ulcer and sacral osteomyelitis, neurogenic bladder with chronic urostomy, anemia of chronic disease, and hypothyroidism who presented to the ED with fever, productive cough found to have left-sided multilobular HCAP. Hgb was 5.9, improved with 2u PRBCs 7/3. Infectious disease specialist was consulted, directed antibiotics with vancomycin, cefepime for a total of 7 days after which CXR showed worsening opacification of left hemithorax. PCCM was consulted and recommended aggressive chest PT, mucolytics, etc. and the patient has shown some improvement. She has completed the course of antibiotics but will continue to require aggressive pulmonary hygiene.   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  - s/p vancomycin/cefepime 7/1 - 7/10. - Had left hemithorax opacification due to atelectasis, mucous plugging (predisposed to this due to MS, paraplegia). This has improved with aggressive chest PT, flutter valve, incentive spirometry, mucolytics which should be continued.  - Will  continue mucomyst BID until repeat CXR.   MS with spastic paraplegia:  - Continue home medications including baclfen, tizanidine, hydrocodone, and outpatient neurology follow up. q3 month botox injections.  Stage 4 sacral pressure ulcer and ulcers to right and left ischium: POA. Has been treated with 8 weeks abx for osteomyelitis earlier this year.  Sacrum: 2cm x3cm x 0.1cm; 100% pink, pale, clean Right ischium: 2cm x2cm x 0.1cm; 100% clean, pale, pink Left ischium: 5cm x 6.5cm x 0.3cm; 95% pale pink, moist/5% fibrinous material along proximal wound edges with epibole of the wound edges - Offload as able - Continue local wound care. Pt was referred to plastic surgery as outpatient by ID.  Anemia of chronic disease: FOBT negative. Normal B12, folate, ferritin.  - Monitor hgb following transfusion on 7/3. Hgb at discharge is 8.3.   Hypothyroidism:  - Continue synthroid at current dose. Last TSH was 37, but currently hospitalized for sepsis, so will defer repeat TFTs to outpatient setting.    Chronic hypotension:  - Continue midodrine  Severe protein calorie malnutrition: BMI 14 - Continue protein supplementation  Discharge Instructions Discharge Instructions    Diet general   Complete by:  As directed    Increase activity slowly   Complete by:  As directed      Allergies as of 08/11/2017   No Known Allergies     Medication List    TAKE these medications   acetaminophen 325 MG tablet Commonly known as:  TYLENOL Take 2 tablets (650 mg total) by mouth every 6 (six) hours as needed for mild pain (or Fever >/= 101).   acetylcysteine 20 % nebulizer solution Commonly known as:  MUCOMYST Take 4 mLs by nebulization 2 (two) times daily.   bisacodyl 5 MG EC  tablet Commonly known as:  DULCOLAX Take 5 mg by mouth daily.   CALCIUM ALGINATE EX Apply to coccyx and right ischial daily   calcium-vitamin D 500-200 MG-UNIT tablet Commonly known as:  OSCAL WITH D Take 2 tablets  by mouth 4 (four) times daily.   ENSURE Take 237 mLs by mouth. With meals. Vanilla   PROMOD Liqd Protein Liquid - give 30 ml by mouth two times daily   NUTRITIONAL SUPPLEMENT PO Frozen Nutritional Treat with meals   famotidine 20 MG tablet Commonly known as:  PEPCID Take 20 mg by mouth every morning.   ferrous sulfate 325 (65 FE) MG tablet Take 325 mg by mouth daily with breakfast.   HYDROcodone-acetaminophen 5-325 MG tablet Commonly known as:  NORCO/VICODIN Take 1 tablet by mouth every 6 (six) hours as needed for moderate pain. What changed:  when to take this   ibuprofen 200 MG tablet Commonly known as:  ADVIL,MOTRIN Take 200 mg by mouth every 12 (twelve) hours as needed.   magnesium oxide 400 (241.3 Mg) MG tablet Commonly known as:  MAG-OX Take 1 tablet (400 mg total) by mouth 2 (two) times daily.   midodrine 2.5 MG tablet Commonly known as:  PROAMATINE Take 1 tablet (2.5 mg total) by mouth 3 (three) times daily with meals.   MUCINEX 600 MG 12 hr tablet Generic drug:  guaiFENesin Take 600 mg by mouth 2 (two) times daily.   multivitamin tablet Take 1 tablet by mouth daily.   ondansetron 4 MG tablet Commonly known as:  ZOFRAN Take 4 mg by mouth every 6 (six) hours as needed for nausea or vomiting.   polyethylene glycol packet Commonly known as:  MIRALAX / GLYCOLAX Take 17 g by mouth daily.   PROBIOTIC PO Take 1 tablet by mouth daily.   SYNTHROID 50 MCG tablet Generic drug:  levothyroxine Take 50 mcg by mouth daily before breakfast.   tizanidine 2 MG capsule Commonly known as:  ZANAFLEX Take 2 mg by mouth 3 (three) times daily.   UNABLE TO FIND PROTEIN LIQUID- 30 ml 2 TIMES DAILY RELATED TO UNSPECIFIED BUTTOCK   VITAMIN B-12 PO Take 1 tablet by mouth daily.   VITAMIN C PO Take 500 mg by mouth 2 (two) times daily.   Vitamin D (Ergocalciferol) 50000 units Caps capsule Commonly known as:  DRISDOL Take 50,000 Units by mouth every 30 (thirty) days.       Follow-up Information    Deanna Cranker, DO Follow up.   Specialty:  Internal Medicine Contact information: [place] [address] [phone]          No Known Allergies  Consultations:  Pulmonology, ID  Procedures/Studies: Dg Chest 2 View  Result Date: 08/02/2017 CLINICAL DATA:  42 year old female with fever. EXAM: CHEST - 2 VIEW COMPARISON:  Chest radiograph dated 03/17/2017 FINDINGS: There is a large left pleural effusion with consolidative changes of the majority of the left lung. Only a small aerated portion of the long noted in the left upper lobe. There is associated volume loss and shift of the mediastinum into the left hemithorax. The right lung is clear. No pneumothorax. No acute osseous pathology. IMPRESSION: Large left pleural effusion with opacification of the left hemithorax as described. CT may provide better evaluation of the size of the pleural effusion and assessment if thoracentesis is clinically considered. Electronically Signed   By: Anner Crete M.D.   On: 08/02/2017 01:23   Ct Chest W Contrast  Result Date:  08/02/2017 CLINICAL DATA:  Recent pneumonia, cough EXAM: CT CHEST WITH CONTRAST TECHNIQUE: Multidetector CT imaging of the chest was performed during intravenous contrast administration. CONTRAST:  79mL OMNIPAQUE IOHEXOL 300 MG/ML  SOLN COMPARISON:  08/02/2017 radiograph, CT chest 08/31/2016 FINDINGS: Cardiovascular: Nonaneurysmal aorta. Normal heart size. No significant pericardial effusion. Mediastinum/Nodes: Midline trachea. No thyroid mass. Borderline mediastinal lymph node, left paratracheal lymph node measures 9 mm. Lungs/Pleura: Small left-sided pleural effusion. Extensive consolidation within the left lower lobe with partial consolidation in the left upper lobe and ground-glass density. No pneumothorax. Similar appearance of volume loss on the left. Upper Abdomen: 5 mm probable stone in the mid right kidney. Musculoskeletal: No  chest wall abnormality. No acute or significant osseous findings. IMPRESSION: 1. Small left-sided pleural effusion. Extensive left lower lobe and left upper lobe consolidative process, most consistent with a pneumonia. 2. Nonobstructing stone within the right kidney. Electronically Signed   By: Donavan Foil M.D.   On: 08/02/2017 02:42   Dg Chest Port 1 View  Result Date: 08/10/2017 CLINICAL DATA:  Shortness of breath EXAM: PORTABLE CHEST 1 VIEW COMPARISON:  August 09, 2017 FINDINGS: There is airspace consolidation in the left lower lobe with moderate left pleural effusion. The right lung is clear. The heart size and pulmonary vascularity are normal. No adenopathy. No evident bone lesions. IMPRESSION: Left lower lobe airspace consolidation with moderate left pleural effusion. Right lung clear. Stable cardiac silhouette. No evident adenopathy. Electronically Signed   By: Lowella Grip III M.D.   On: 08/10/2017 07:34   Dg Chest Port 1 View  Result Date: 08/09/2017 CLINICAL DATA:  Respiratory failure.  Short of breath. EXAM: PORTABLE CHEST 1 VIEW COMPARISON:  08/08/2017 FINDINGS: There is now moderate aeration in the left upper lobe. There is continued shift of the mediastinum to the left and opacity at the left base consistent with persistent left lower lobe collapse. Right lung is clear. Normal heart size. No pneumothorax. Left pleural effusion is noted. IMPRESSION: There is improved aeration in the left upper lobe but there is persistent volume loss in the left lung. Electronically Signed   By: Marybelle Killings M.D.   On: 08/09/2017 08:38   Dg Chest Port 1 View  Result Date: 08/08/2017 CLINICAL DATA:  Tachypnea today. EXAM: PORTABLE CHEST 1 VIEW COMPARISON:  Chest x-ray of August 02, 2017 FINDINGS: There is now all but near-total opacification of the left hemithorax with shift of the mediastinum toward the left. The right lung is hyperinflated. There are coarse lung markings in the right infrahilar region more  conspicuous today. The cardiac silhouette is obscured. The pulmonary vascularity on the right is not engorged. IMPRESSION: Near-total opacification of the left hemithorax which has worsened over the past 6 days. Given the mediastinal shift toward the left, a central obstructing lesion is suspected. Subtle increased density in the right infrahilar region may reflect atelectasis or pneumonia. Electronically Signed   By: David  Martinique M.D.   On: 08/08/2017 08:02   Dg Swallowing Func-speech Pathology  Result Date: 08/08/2017 Objective Swallowing Evaluation: Type of Study: MBS-Modified Barium Swallow Study  Patient Details Name: Bular Hickok MRN: [id number] Date of Birth: 1975-07-29 Today's Date: 08/08/2017 Time: SLP Start Time (ACUTE ONLY): 0930 -SLP Stop Time (ACUTE ONLY): 0946 SLP Time Calculation (min) (ACUTE ONLY): 16 min Past Medical History: Past Medical History: Diagnosis Date . Buttock wound 03/03/2016 . Dysrhythmia   tachycardia . MS (multiple sclerosis) ([place])  . Neutropenia ([place])  . Pneumonia 02/2016 . Protein calorie malnutrition (  [place])  . Sacral osteomyelitis ([place]) 05/19/2017 . Severe sepsis ([place]) 03/03/2016 . 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 UTI (urinary tract infection) 02/2016 Past Surgical History: Past Surgical History: Procedure Laterality Date . DIVERTING ILEOSTOMY N/A 09/03/2016  Procedure: ILEAL CONDUIT  URINARY DIVERSION OPEN;  Surgeon: Ardis Hughs, MD;  Location: [facility];  Service: Urology;  Laterality: N/A; HPI: 42 year old female admitted 08/01/17 with sepsis. PMH: MS with spastic paraplegia, PNA, silent aspiration. CXR = LLL/LUL consolidative process c/w PNA, left pleural effusion  Subjective: pt says she advanced to thin liquids since prior MBS but also denies any other objective testing Assessment / Plan / Recommendation CHL IP CLINICAL IMPRESSIONS 08/04/2017 Clinical Impression Pt presents with a mild oropharyngeal dysphagia due to positioning, timing, and reduced  sensation. She has intermittent premature spillage of thin liquids, particularly with larger boluses, which silently spills into her airway. Thin liquids also appear to enter the laryngeal vestibule posteriorly, which makes me also question the integrity of her vocal fold adduction, particularly in light of her dysphonia. Min cues were provided for a chin tuck, which helped provide better oral containement and therefore better airway protection. No other consistencies entered the airway, clearing the pharynx with good efficiency as well. Of note, attempted to test nectar thick liquids in case pt was not able to consistently use a chin tuck, but she continued to use a chin tuck despite being cued not to do so. Recommend continuing regular diet textures and thin liquids with use of a chin tuck for all liquids. SLP will f/u for tolerance and utilization of precautions.  SLP Visit Diagnosis Dysphagia, oropharyngeal phase (R13.12) Attention and concentration deficit following -- Frontal lobe and executive function deficit following -- Impact on safety and function --   CHL IP TREATMENT RECOMMENDATION 08/03/2017 Treatment Recommendations Therapy as outlined in treatment plan below   Prognosis 08/03/2017 Prognosis for Safe Diet Advancement Fair Barriers to Reach Goals Time post onset Barriers/Prognosis Comment -- CHL IP DIET RECOMMENDATION 08/04/2017 SLP Diet Recommendations -- Liquid Administration via -- Medication Administration -- Compensations Slow rate;Small sips/bites;Chin tuck;Use straw to facilitate chin tuck Postural Changes --   CHL IP OTHER RECOMMENDATIONS 08/03/2017 Recommended Consults -- Oral Care Recommendations Oral care BID Other Recommendations --   CHL IP FOLLOW UP RECOMMENDATIONS 08/04/2017 Follow up Recommendations Skilled Nursing facility   [facility] IP FREQUENCY AND DURATION 08/03/2017 Speech Therapy Frequency (ACUTE ONLY) min 2x/week Treatment Duration 2 weeks      CHL IP ORAL PHASE 08/03/2017 Oral Phase Impaired Oral  - Pudding Teaspoon -- Oral - Pudding Cup -- Oral - Honey Teaspoon -- Oral - Honey Cup -- Oral - Nectar Teaspoon -- Oral - Nectar Cup WFL Oral - Nectar Straw -- Oral - Thin Teaspoon -- Oral - Thin Cup Premature spillage Oral - Thin Straw Premature spillage;Other (Comment) Oral - Puree WFL Oral - Mech Soft WFL Oral - Regular -- Oral - Multi-Consistency -- Oral - Pill -- Oral Phase - Comment --  CHL IP PHARYNGEAL PHASE 08/03/2017 Pharyngeal Phase Impaired Pharyngeal- Pudding Teaspoon -- Pharyngeal -- Pharyngeal- Pudding Cup -- Pharyngeal -- Pharyngeal- Honey Teaspoon -- Pharyngeal -- Pharyngeal- Honey Cup -- Pharyngeal -- Pharyngeal- Nectar Teaspoon -- Pharyngeal -- Pharyngeal- Nectar Cup [facility];Other (Comment) Pharyngeal -- Pharyngeal- Nectar Straw -- Pharyngeal -- Pharyngeal- Thin Teaspoon -- Pharyngeal -- Pharyngeal- Thin Cup Reduced airway/laryngeal closure;Penetration/Aspiration before swallow Pharyngeal Material enters airway, passes BELOW cords without attempt by patient to eject out (silent aspiration) Pharyngeal- Thin  Straw Compensatory strategies attempted (with notebox);Other (Comment) Pharyngeal -- Pharyngeal- Puree WFL Pharyngeal -- Pharyngeal- Mechanical Soft WFL Pharyngeal -- Pharyngeal- Regular -- Pharyngeal -- Pharyngeal- Multi-consistency -- Pharyngeal -- Pharyngeal- Pill -- Pharyngeal -- Pharyngeal Comment --  CHL IP CERVICAL ESOPHAGEAL PHASE 08/03/2017 Cervical Esophageal Phase WFL Pudding Teaspoon -- Pudding Cup -- Honey Teaspoon -- Honey Cup -- Nectar Teaspoon -- Nectar Cup -- Nectar Straw -- Thin Teaspoon -- Thin Cup -- Thin Straw -- Puree -- Mechanical Soft -- Regular -- Multi-consistency -- Pill -- Cervical Esophageal Comment -- No flowsheet data found. [name] 08/08/2017, 4:00 PM    [name], M.A. CCC-SLP [phone]             Subjective: Breathing is back to baseline, denies any dyspnea or chest pain or fever. Continues to use flutter valve diligently.   Discharge  Exam: Vitals:   08/10/17 2332 08/11/17 0841  BP:    Pulse:    Resp:    Temp: 98.8 F (37.1 C)   SpO2:  97%   General: Pt is alert, awake, not in acute distress Cardiovascular: RRR, S1/S2 +, no rubs, no gallops Respiratory: Nonlabored, decreased at left base but better aeration than prior exams. No crackles or wheezing.  Abdominal: Soft, NT, ND, bowel sounds + Extremities: Spastic paraplegia stable. Diffuse muscle wasting.   Labs: BNP (last 3 results) No results for input(s): BNP in the last 8760 hours. Basic Metabolic Panel: Recent Labs  Lab 08/05/17 0627 08/07/17 0425 08/08/17 0740  NA 144 142 142  K 4.4 4.2 4.7  CL 112* 111 111  CO2 24 24 24   GLUCOSE 75 86 84  BUN 21* 15 14  CREATININE 0.76 0.58 0.52  CALCIUM 7.9* 8.5* 8.8*   Liver Function Tests: No results for input(s): AST, ALT, ALKPHOS, BILITOT, PROT, ALBUMIN in the last 168 hours. No results for input(s): LIPASE, AMYLASE in the last 168 hours. No results for input(s): AMMONIA in the last 168 hours. CBC: Recent Labs  Lab 08/05/17 0627 08/07/17 0425 08/08/17 0740  WBC 7.8 6.8 7.9  HGB 9.4* 8.0* 8.3*  HCT 29.1* 25.5* 26.2*  MCV 91.5 94.8 94.6  PLT 440* 364 404*   Cardiac Enzymes: No results for input(s): CKTOTAL, CKMB, CKMBINDEX, TROPONINI in the last 168 hours. BNP: Invalid input(s): POCBNP CBG: No results for input(s): GLUCAP in the last 168 hours. D-Dimer No results for input(s): DDIMER in the last 72 hours. Hgb A1c No results for input(s): HGBA1C in the last 72 hours. Lipid Profile No results for input(s): CHOL, HDL, LDLCALC, TRIG, CHOLHDL, LDLDIRECT in the last 72 hours. Thyroid function studies No results for input(s): TSH, T4TOTAL, T3FREE, THYROIDAB in the last 72 hours.  Invalid input(s): FREET3 Anemia work up No results for input(s): VITAMINB12, FOLATE, FERRITIN, TIBC, IRON, RETICCTPCT in the last 72 hours. Urinalysis    Component Value Date/Time   COLORURINE AMBER (A) 08/02/2017  0001   APPEARANCEUR CLOUDY (A) 08/02/2017 0001   LABSPEC 1.013 08/02/2017 0001   PHURINE 9.0 (H) 08/02/2017 0001   GLUCOSEU NEGATIVE 08/02/2017 0001   HGBUR NEGATIVE 08/02/2017 0001   BILIRUBINUR NEGATIVE 08/02/2017 0001   KETONESUR NEGATIVE 08/02/2017 0001   PROTEINUR 100 (A) 08/02/2017 0001   UROBILINOGEN 0.2 06/25/2008 1551   NITRITE NEGATIVE 08/02/2017 0001   LEUKOCYTESUR LARGE (A) 08/02/2017 0001    Microbiology Recent Results (from the past 240 hour(s))  Urine culture     Status: Abnormal   Collection Time: 08/02/17 12:01 AM  Result Value  Ref Range Status   Specimen Description URINE, CATHETERIZED  Final   Special Requests   Final    Immunocompromised Performed at [facility], [place] [address] [place], [place] [address]    Culture MULTIPLE SPECIES PRESENT, SUGGEST RECOLLECTION (A)  Final   Report Status 08/03/2017 FINAL  Final  Culture, blood (Routine x 2)     Status: None   Collection Time: 08/02/17 12:02 AM  Result Value Ref Range Status   Specimen Description SITE NOT SPECIFIED  Final   Special Requests   Final    BOTTLES DRAWN AEROBIC AND ANAEROBIC Blood Culture results may not be optimal due to an inadequate volume of blood received in culture bottles   Culture   Final    NO GROWTH 5 DAYS Performed at [facility], [place] [address], [place], [place] [address]    Report Status 08/07/2017 FINAL  Final  Culture, blood (Routine x 2)     Status: None   Collection Time: 08/02/17 12:03 AM  Result Value Ref Range Status   Specimen Description BLOOD LEFT WRIST  Final   Special Requests   Final    BOTTLES DRAWN AEROBIC AND ANAEROBIC Blood Culture results may not be optimal due to an excessive volume of blood received in culture bottles   Culture   Final    NO GROWTH 5 DAYS Performed at [facility], [place] [address], [place], [place] [address]    Report Status 08/07/2017 FINAL  Final  MRSA PCR Screening     Status: Abnormal   Collection Time: 08/02/17   7:44 AM  Result Value Ref Range Status   MRSA by PCR POSITIVE (A) NEGATIVE Final    Comment:        The GeneXpert MRSA Assay (FDA approved for NASAL specimens only), is one component of a comprehensive MRSA colonization surveillance program. It is not intended to diagnose MRSA infection nor to guide or monitor treatment for MRSA infections. RESULT CALLED TO, READ BACK BY AND VERIFIED WITH: Berlinda Last RN 12:10 08/02/08 ([email or web address]) Performed at [facility], [place] [address], [place], [place] [address]     Time coordinating discharge: Approximately 40 minutes  Patrecia Pour, MD  [facility] 08/11/2017, 10:04 AM Pager [phone]

## 2017-08-12 ENCOUNTER — Telehealth: Payer: Self-pay

## 2017-08-12 NOTE — Telephone Encounter (Signed)
Possible re-admission to facility. This is a patient you were seeing at [facility] . [facility] F/U is needed if patient was re-admitted to facility upon discharge. Hospital discharge from [facility] on 08/11/2017

## 2017-08-15 ENCOUNTER — Non-Acute Institutional Stay (SKILLED_NURSING_FACILITY): Payer: Medicare Other | Admitting: Internal Medicine

## 2017-08-15 ENCOUNTER — Encounter: Payer: Self-pay | Admitting: Internal Medicine

## 2017-08-15 DIAGNOSIS — D638 Anemia in other chronic diseases classified elsewhere: Secondary | ICD-10-CM

## 2017-08-15 DIAGNOSIS — L89314 Pressure ulcer of right buttock, stage 4: Secondary | ICD-10-CM

## 2017-08-15 DIAGNOSIS — E441 Mild protein-calorie malnutrition: Secondary | ICD-10-CM

## 2017-08-15 DIAGNOSIS — Z936 Other artificial openings of urinary tract status: Secondary | ICD-10-CM

## 2017-08-15 DIAGNOSIS — G35 Multiple sclerosis: Secondary | ICD-10-CM

## 2017-08-15 DIAGNOSIS — R627 Adult failure to thrive: Secondary | ICD-10-CM | POA: Diagnosis not present

## 2017-08-15 DIAGNOSIS — L89324 Pressure ulcer of left buttock, stage 4: Secondary | ICD-10-CM

## 2017-08-15 DIAGNOSIS — L89154 Pressure ulcer of sacral region, stage 4: Secondary | ICD-10-CM

## 2017-08-15 DIAGNOSIS — G8220 Paraplegia, unspecified: Secondary | ICD-10-CM

## 2017-08-15 NOTE — Progress Notes (Signed)
Patient ID: Deanna Baldwin, female   DOB: [birth year], 42 y.o.   MRN: [id number]  Provider:  DR Arletha Grippe Location:  [facility] Room Number: 215 A Place of Service:  SNF (31)  PCP: [name], DO Patient Care Team: [name], DO as PCP - [facility] (Internal Medicine) [facility], [place] ([place])  Extended Emergency Contact Information Primary Emergency Contact: Dalbert Mayotte, [place] [address] [place]te Litter of [place] Phone: [phone] Mobile Phone: [phone] Relation: Aunt Secondary Emergency Contact: Smith,Crystal  [place] Mobile Phone: [phone] Relation: Sister  Code Status: Full Code Goals of Care: Advanced Directive information Advanced Directives 08/15/2017  Does Patient Have a Medical Advance Directive? Yes  Type of Advance Directive Out of facility DNR (pink MOST or yellow form)  Does patient want to make changes to medical advance directive? No - Patient declined  Copy of [place] in Chart? -  Would patient like information on creating a medical advance directive? No - Patient declined  Pre-existing out of facility DNR order (yellow form or pink MOST form) Pink MOST form placed in chart (order not valid for inpatient use)      Chief Complaint  Patient presents with  . Readmit To SNF    Readmission    HPI: Patient is a 42 y.o. female seen today for re-admission to SNF following hospital stay for multilobe HCAP, sepsis, atelectasis, stage 4 pressure ulcer, protein calorie malnutrition, hypothyroidism, MS, AOCD. Hgb dropped to 5.9 >> 2 units PRBCs. ID consulted when imaging revealed mulitlobar HCAP. She was tx with IV vanco/cefepime x 7 days. She was given aggressive chest PT and mucolytics. Hgb 5.9 >>8.3; alk phos 627; ALt 87; AST 174; Cr 1.87 >>0.52; K 6.2 >>4.7; Na peaked 147 >>142; Ca2+ dropped 6.6 >>8.8; albumin 3.2; Total bilirubin 1.8 at d/c. She  presents to SNF for short term rehab then cont long term care.  Today she reports no concerns. No SOB or cough. No CP, f/c. Appetite poor. Sleeps well.  Past Medical History:  Diagnosis Date  . Buttock wound 03/03/2016  . Dysrhythmia    tachycardia  . MS (multiple sclerosis) ([place])   . Neutropenia ([place])   . Pneumonia 02/2016  . Protein calorie malnutrition ([place])   . Sacral osteomyelitis ([place]) 05/19/2017  . Severe sepsis ([place]) 03/03/2016  . 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 UTI (urinary tract infection) 02/2016   Past Surgical History:  Procedure Laterality Date  . DIVERTING ILEOSTOMY N/A 09/03/2016   Procedure: ILEAL CONDUIT  URINARY DIVERSION OPEN;  Surgeon: Ardis Hughs, MD;  Location: [facility];  Service: Urology;  Laterality: N/A;    reports that she quit smoking about 7 years ago. Her smoking use included cigarettes. She has a 10.00 pack-year smoking history. She has never used smokeless tobacco. She reports that she does not drink alcohol or use drugs. Social History   Socioeconomic History  . Marital status: Single    Spouse name: Not on file  . Number of children: Not on file  . Years of education: Not on file  . Highest education level: Not on file  Occupational History  . Occupation: Disabled  Social Needs  . Financial resource strain: Not on file  . Food insecurity:    Worry: Not on file    Inability: Not on file  . Transportation needs:    Medical: Not on file  Non-medical: Not on file  Tobacco Use  . Smoking status: Former Smoker    Packs/day: 1.00    Years: 10.00    Pack years: 10.00    Types: Cigarettes    Last attempt to quit: 02/01/2010    Years since quitting: 7.5  . Smokeless tobacco: Never Used  Substance and Sexual Activity  . Alcohol use: No  . Drug use: No  . Sexual activity: Not on file  Lifestyle  . Physical activity:    Days per week: Not on file    Minutes per session: Not on file  . Stress: Not on file    Relationships  . Social connections:    Talks on phone: Not on file    Gets together: Not on file    Attends religious service: Not on file    Active member of club or organization: Not on file    Attends meetings of clubs or organizations: Not on file    Relationship status: Not on file  . Intimate partner violence:    Fear of current or ex partner: Not on file    Emotionally abused: Not on file    Physically abused: Not on file    Forced sexual activity: Not on file  Other Topics Concern  . Not on file  Social History Narrative   She is a resident at [facility].   Right-handed.    Functional Status Survey:    Family History  Problem Relation Age of Onset  . Mental illness Sister     Health Maintenance  Topic Date Due  . TETANUS/TDAP  04/15/2018 (Originally 07/16/1995)  . INFLUENZA VACCINE  09/01/2017  . HIV Screening  Completed  . PAP SMEAR  Discontinued    No Known Allergies  Outpatient Encounter Medications as of 08/15/2017  Medication Sig  . acetaminophen (TYLENOL) 325 MG tablet Take 2 tablets (650 mg total) by mouth every 6 (six) hours as needed for mild pain (or Fever >/= 101).  Marland Kitchen acetylcysteine (MUCOMYST) 20 % nebulizer solution Take 4 mLs by nebulization 2 (two) times daily.  . Ascorbic Acid (VITAMIN C PO) Take 500 mg by mouth 2 (two) times daily.   . bisacodyl (DULCOLAX) 5 MG EC tablet Take 5 mg by mouth daily.  Marland Kitchen CALCIUM ALGINATE EX Apply to coccyx and right ischial daily  . calcium-vitamin D (OSCAL WITH D) 500-200 MG-UNIT tablet Take 2 tablets by mouth 4 (four) times daily.  . Cyanocobalamin (VITAMIN B-12 PO) Take 1 tablet by mouth daily.  Marland Kitchen ENSURE (ENSURE) Take 237 mLs by mouth. With meals. Vanilla  . famotidine (PEPCID) 20 MG tablet Take 20 mg by mouth every morning.  . ferrous sulfate 325 (65 FE) MG tablet Take 325 mg by mouth daily with breakfast.  . guaiFENesin (MUCINEX) 600 MG 12 hr tablet Take 600 mg by mouth 2 (two) times daily.  Marland Kitchen  HYDROcodone-acetaminophen (NORCO/VICODIN) 5-325 MG tablet Take 1 tablet by mouth every 6 (six) hours as needed for moderate pain.  Marland Kitchen ibuprofen (ADVIL,MOTRIN) 200 MG tablet Take 200 mg by mouth every 12 (twelve) hours as needed.  Marland Kitchen ipratropium-albuterol (DUONEB) 0.5-2.5 (3) MG/3ML SOLN Take 3 mLs by nebulization every 6 (six) hours. X 7 days  . levothyroxine (SYNTHROID) 50 MCG tablet Take 50 mcg by mouth daily before breakfast.  . magnesium oxide (MAG-OX) 400 (241.3 Mg) MG tablet Take 1 tablet (400 mg total) by mouth 2 (two) times daily.  . midodrine (PROAMATINE) 2.5 MG tablet Take 1  tablet (2.5 mg total) by mouth 3 (three) times daily with meals.  . Multiple Vitamin (MULTIVITAMIN) tablet Take 1 tablet by mouth daily.  . Nutritional Supplements (NUTRITIONAL SUPPLEMENT PO) Frozen Nutritional Treat with meals  . Nutritional Supplements (PROMOD) LIQD Protein Liquid - give 30 ml by mouth two times daily  . ondansetron (ZOFRAN) 4 MG tablet Take 4 mg by mouth every 6 (six) hours as needed for nausea or vomiting.  . polyethylene glycol (MIRALAX / GLYCOLAX) packet Take 17 g by mouth daily.  . Probiotic Product (PROBIOTIC PO) Take 1 tablet by mouth daily.  . tizanidine (ZANAFLEX) 2 MG capsule Take 2 mg by mouth 3 (three) times daily.  . Vitamin D, Ergocalciferol, (DRISDOL) 50000 units CAPS capsule Take 50,000 Units by mouth every 30 (thirty) days.  . [DISCONTINUED] UNABLE TO FIND PROTEIN LIQUID- 30 ml 2 TIMES DAILY RELATED TO UNSPECIFIED BUTTOCK   Facility-Administered Encounter Medications as of 08/15/2017  Medication  . incobotulinumtoxinA (XEOMIN) 100 units injection 500 Units    Review of Systems  Constitutional: Positive for appetite change.  Musculoskeletal: Positive for gait problem.  Skin: Positive for wound.  All other systems reviewed and are negative.   Vitals:   08/15/17 0930  BP: 110/80  Pulse: 80  Resp: 16  Temp: 98.1 F (36.7 C)  SpO2: 97%  Weight: 91 lb (41.3 kg)  Height: 5'  11" (1.803 m)   Body mass index is 12.69 kg/m. Physical Exam  Constitutional: She is oriented to person, place, and time. She appears well-developed and well-nourished.  Frail appearing in NAD, lying in bed on left side. Min conversational dyspnea  HENT:  Mouth/Throat: Oropharynx is clear and moist. No oropharyngeal exudate.  MMM; no oral thrush  Eyes: Pupils are equal, round, and reactive to light. No scleral icterus.  Neck: Neck supple. Carotid bruit is not present. No tracheal deviation present. No thyromegaly present.  Cardiovascular: Normal rate, regular rhythm and intact distal pulses. Exam reveals no gallop and no friction rub.  Murmur (1/6 SEM) heard. No LE edema b/l. no calf TTP.   Pulmonary/Chest: Effort normal. No stridor. No respiratory distress. She has decreased breath sounds (on left > right). She has no wheezes. She has no rales.  Abdominal: Soft. Normal appearance and bowel sounds are normal. She exhibits no distension and no mass. There is no hepatomegaly. There is no tenderness. There is no rigidity, no rebound and no guarding. No hernia.  Urostomy present with clear yellow urine. No blood noted  Musculoskeletal: She exhibits deformity (contractures present).  Lymphadenopathy:    She has no cervical adenopathy.  Neurological: She is alert and oriented to person, place, and time. She has normal reflexes.  Skin: Skin is warm and dry. No rash noted.  Left hip wound; sacral wound present - followed by wound care; b/l unna boots present  Psychiatric: She has a normal mood and affect. Her behavior is normal. Judgment and thought content normal.    Labs reviewed: Basic Metabolic Panel: Recent Labs    09/10/16 0516  04/09/17 0759  04/10/17 0536  04/23/17 1241  08/05/17 0627 08/07/17 0425 08/08/17 0740  NA 142   < > 144  --  142   < > 143   < > 144 142 142  K 3.5   < > 4.3  --  3.8   < > 4.6   < > 4.4 4.2 4.7  CL 106   < > 110  --  107   < >  105   < > 112* 111 111    CO2 27   < > 25  --  26   < > 26   < > _0 GLUCOSE 96   < > 89  --  98   < > 88   < > 75 86 84  BUN 9   < > 20  --  19   < > 28*   < > 21* 15 14  CREATININE <0.30*   < > 0.45  --  0.45   < > 0.46   < > 0.76 0.58 0.52  CALCIUM 8.9   < > 6.4*   < > 7.6*   < > 6.7*   < > 7.9* 8.5* 8.8*  MG 1.8  --  1.6*  --  2.3  --  1.5*  --   --   --   --   PHOS 3.2  --  6.0*  --  4.6  --   --   --   --   --   --    < > = values in this interval not displayed.   Liver Function Tests: Recent Labs    04/10/17 0536 04/23/17 1241  05/23/17 06/29/17 08/01/17 2347  AST 20 36   < > 230* 70* 174*  ALT 14 26   < > 156* 88* 87*  ALKPHOS 305* 284*   < > 559* 999* 627*  BILITOT 0.5 0.5  --   --   --  1.8*  PROT 6.5 7.1  --   --   --  7.1  ALBUMIN 2.9* 3.4*  --   --   --  3.2*   < > = values in this interval not displayed.   Recent Labs    03/17/17 0918  LIPASE 128*   No results for input(s): AMMONIA in the last 8760 hours. CBC: Recent Labs    05/30/17 06/29/17 08/01/17 2347  08/05/17 0627 08/07/17 0425 08/08/17 0740  WBC 6.0 5.5 12.5*   < > 7.8 6.8 7.9  NEUTROABS 4 4 10.6*  --   --   --   --   HGB 8.5* 9.0* 8.0*   < > 9.4* 8.0* 8.3*  HCT 25* 27* 26.4*   < > 29.1* 25.5* 26.2*  MCV  --   --  103.5*   < > 91.5 94.8 94.6  PLT 395 439* 487*   < > 440* 364 404*   < > = values in this interval not displayed.   Cardiac Enzymes: Recent Labs    08/31/16 0259 08/31/16 1155 08/31/16 1449  TROPONINI <0.03 <0.03 <0.03   BNP: Invalid input(s): POCBNP Lab Results  Component Value Date   HGBA1C 5.5 06/24/2016   Lab Results  Component Value Date   TSH 37.80 (A) 06/29/2017   Lab Results  Component Value Date   VITAMINB12 948 07/13/2017   Lab Results  Component Value Date   FOLATE 30.0 04/08/2017   Lab Results  Component Value Date   IRON 51 04/08/2017   TIBC 199 (L) 04/08/2017   FERRITIN 736 (H) 04/08/2017    Imaging and Procedures obtained prior to SNF admission: Dg Chest 2  View  Result Date: 08/02/2017 CLINICAL DATA:  42 year old female with fever. EXAM: CHEST - 2 VIEW COMPARISON:  Chest radiograph dated 03/17/2017 FINDINGS: There is a large left pleural effusion with consolidative changes of the majority of the left lung. Only a small aerated portion of  the long noted in the left upper lobe. There is associated volume loss and shift of the mediastinum into the left hemithorax. The right lung is clear. No pneumothorax. No acute osseous pathology. IMPRESSION: Large left pleural effusion with opacification of the left hemithorax as described. CT may provide better evaluation of the size of the pleural effusion and assessment if thoracentesis is clinically considered. Electronically Signed   By: Anner Crete M.D.   On: 08/02/2017 01:23   Ct Chest W Contrast  Result Date: 08/02/2017 CLINICAL DATA:  Recent pneumonia, cough EXAM: CT CHEST WITH CONTRAST TECHNIQUE: Multidetector CT imaging of the chest was performed during intravenous contrast administration. CONTRAST:  26m OMNIPAQUE IOHEXOL 300 MG/ML  SOLN COMPARISON:  08/02/2017 radiograph, CT chest 08/31/2016 FINDINGS: Cardiovascular: Nonaneurysmal aorta. Normal heart size. No significant pericardial effusion. Mediastinum/Nodes: Midline trachea. No thyroid mass. Borderline mediastinal lymph node, left paratracheal lymph node measures 9 mm. Lungs/Pleura: Small left-sided pleural effusion. Extensive consolidation within the left lower lobe with partial consolidation in the left upper lobe and ground-glass density. No pneumothorax. Similar appearance of volume loss on the left. Upper Abdomen: 5 mm probable stone in the mid right kidney. Musculoskeletal: No chest wall abnormality. No acute or significant osseous findings. IMPRESSION: 1. Small left-sided pleural effusion. Extensive left lower lobe and left upper lobe consolidative process, most consistent with a pneumonia. 2. Nonobstructing stone within the right kidney. Electronically  Signed   By: KDonavan FoilM.D.   On: 08/02/2017 02:42    Assessment/Plan   ICD-10-CM   1. Anemia of chronic disease D63.8   2. FTT (failure to thrive) in adult R62.7   3. Mild protein-calorie malnutrition (HCC) E44.1   4. Pressure injury of sacral region, stage 4 (HCC) L89.154   5. Pressure sore of left ischium, stage 4 (HCC) L89.324   6. Pressure injury of right ischium, stage 4 ([place] L89.314   7. 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[place] Z93.6    Cont current meds  PT/OT/ST as ordered  Wound care per facility wound care provider  Urostomy care as indicated  Cont nutritional supplements as ordered  F/u with specialists as scheduled  GOAL: short term rehab then continue long term care. Communicated with pt and nursing.  [facility] NP to follow  Will follow  Labs/tests ordered: cbc w diff   Axle Parfait S. CPerlie Gold PMarshall Browning Hospitaland Adult Medicine [address]GU[address] [place] [phone] (Monday-Friday 8 AM - 5 PM) [phone] 5 PM and follow prompts

## 2017-08-16 ENCOUNTER — Ambulatory Visit: Payer: Medicare Other | Admitting: Infectious Disease

## 2017-08-18 ENCOUNTER — Non-Acute Institutional Stay (SKILLED_NURSING_FACILITY): Payer: Medicare Other

## 2017-08-18 DIAGNOSIS — Z0000 Encounter for general adult medical examination without abnormal findings: Secondary | ICD-10-CM

## 2017-08-18 NOTE — Progress Notes (Addendum)
Subjective:   Deanna Baldwin is a 42 y.o. female who presents for Medicare Annual (Subsequent) preventive examination at [facility]  Last AWV-08/09/2017    Objective:     Vitals: BP 110/75 (BP Location: Left Arm, Patient Position: Supine)   Pulse 80   Temp 98.2 F (36.8 C) (Oral)   Ht 5\' 11"  (1.803 m)   Wt 91 lb (41.3 kg)   BMI 12.69 kg/m   Body mass index is 12.69 kg/m.  Advanced Directives 08/18/2017 08/15/2017 08/03/2017 07/07/2017 05/09/2017 04/14/2017 04/09/2017  Does Patient Have a Medical Advance Directive? Yes Yes No Yes Yes Yes Yes  Type of Advance Directive Out of facility DNR (pink MOST or yellow form) Out of facility DNR (pink MOST or yellow form) - Out of facility DNR (pink MOST or yellow form) Out of facility DNR (pink MOST or yellow form) Out of facility DNR (pink MOST or yellow form) Healthcare Power of Attorney  Does patient want to make changes to medical advance directive? No - Patient declined No - Patient declined - No - Patient declined No - Patient declined No - Patient declined No - Patient declined  Copy of [place] in Chart? - - - - - - No - copy requested  Would patient like information on creating a medical advance directive? No - Patient declined No - Patient declined No - Patient declined - - - -  Pre-existing out of facility DNR order (yellow form or pink MOST form) Pink MOST form placed in chart (order not valid for inpatient use) Pink MOST form placed in chart (order not valid for inpatient use) - Pink MOST form placed in chart (order not valid for inpatient use) Pink MOST form placed in chart (order not valid for inpatient use) Pink MOST form placed in chart (order not valid for inpatient use) -    Tobacco Social History   Tobacco Use  Smoking Status Former Smoker  . Packs/day: 1.00  . Years: 10.00  . Pack years: 10.00  . Types: Cigarettes  . Last attempt to quit: 02/01/2010  . Years since quitting: 7.5  Smokeless  Tobacco Never Used     Counseling given: Not Answered   Clinical Intake:  Pre-visit preparation completed: No  Pain : No/denies pain     Diabetes: No  How often do you need to have someone help you when you read instructions, pamphlets, or other written materials from your doctor or pharmacy?: 2 - Rarely What is the last grade level you completed in school?: [employer]?: No  Information entered by :: Tyson Dense, RN  Past Medical History:  Diagnosis Date  . Buttock wound 03/03/2016  . Dysrhythmia    tachycardia  . MS (multiple sclerosis) ([place])   . Neutropenia ([place])   . Pneumonia 02/2016  . Protein calorie malnutrition ([place])   . 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 Severe sepsis ([place]) 03/03/2016  . Spastic 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 ([place])   . UTI (urinary tract infection) 02/2016   Past Surgical History:  Procedure Laterality Date  . DIVERTING ILEOSTOMY N/A 09/03/2016   Procedure: ILEAL CONDUIT  URINARY DIVERSION OPEN;  Surgeon: Ardis Hughs, MD;  Location: [facility];  Service: Urology;  Laterality: N/A;   Family History  Problem Relation Age of Onset  . Mental illness Sister    Social History   Socioeconomic History  . Marital status: Single    Spouse name: Not on file  .  Number of children: Not on file  . Years of education: Not on file  . Highest education level: Not on file  Occupational History  . Occupation: Disabled  Social Needs  . Financial resource strain: Not hard at all  . Food insecurity:    Worry: Never true    Inability: Never true  . Transportation needs:    Medical: No    Non-medical: No  Tobacco Use  . Smoking status: Former Smoker    Packs/day: 1.00    Years: 10.00    Pack years: 10.00    Types: Cigarettes    Last attempt to quit: 02/01/2010    Years since quitting: 7.5  . Smokeless tobacco: Never Used  Substance and Sexual Activity  . Alcohol use: No  . Drug use: No  . Sexual activity:  Not on file  Lifestyle  . Physical activity:    Days per week: 0 days    Minutes per session: 0 min  . Stress: Not at all  Relationships  . Social connections:    Talks on phone: More than three times a week    Gets together: Once a week    Attends religious service: Never    Active member of club or organization: No    Attends meetings of clubs or organizations: Never    Relationship status: Never married  Other Topics Concern  . Not on file  Social History Narrative   She is a resident at [facility].   Right-handed.    Outpatient Encounter Medications as of 08/18/2017  Medication Sig  . acetaminophen (TYLENOL) 325 MG tablet Take 2 tablets (650 mg total) by mouth every 6 (six) hours as needed for mild pain (or Fever >/= 101).  Marland Kitchen acetylcysteine (MUCOMYST) 20 % nebulizer solution Take 4 mLs by nebulization 2 (two) times daily.  . Ascorbic Acid (VITAMIN C PO) Take 500 mg by mouth 2 (two) times daily.   . bisacodyl (DULCOLAX) 5 MG EC tablet Take 5 mg by mouth daily.  Marland Kitchen CALCIUM ALGINATE EX Apply to coccyx and right ischial daily  . calcium-vitamin D (OSCAL WITH D) 500-200 MG-UNIT tablet Take 2 tablets by mouth 4 (four) times daily.  . Cyanocobalamin (VITAMIN B-12 PO) Take 1 tablet by mouth daily.  Marland Kitchen ENSURE (ENSURE) Take 237 mLs by mouth. With meals. Vanilla  . famotidine (PEPCID) 20 MG tablet Take 20 mg by mouth every morning.  . ferrous sulfate 325 (65 FE) MG tablet Take 325 mg by mouth daily with breakfast.  . guaiFENesin (MUCINEX) 600 MG 12 hr tablet Take 600 mg by mouth 2 (two) times daily.  Marland Kitchen HYDROcodone-acetaminophen (NORCO/VICODIN) 5-325 MG tablet Take 1 tablet by mouth every 6 (six) hours as needed for moderate pain.  Marland Kitchen ibuprofen (ADVIL,MOTRIN) 200 MG tablet Take 200 mg by mouth every 12 (twelve) hours as needed.  Marland Kitchen ipratropium-albuterol (DUONEB) 0.5-2.5 (3) MG/3ML SOLN Take 3 mLs by nebulization every 6 (six) hours. X 7 days  . levothyroxine (SYNTHROID) 50 MCG  tablet Take 50 mcg by mouth daily before breakfast.  . magnesium oxide (MAG-OX) 400 (241.3 Mg) MG tablet Take 1 tablet (400 mg total) by mouth 2 (two) times daily.  . midodrine (PROAMATINE) 2.5 MG tablet Take 1 tablet (2.5 mg total) by mouth 3 (three) times daily with meals.  . Multiple Vitamin (MULTIVITAMIN) tablet Take 1 tablet by mouth daily.  . Nutritional Supplements (NUTRITIONAL SUPPLEMENT PO) Frozen Nutritional Treat with meals  . Nutritional Supplements (PROMOD) LIQD Protein  Liquid - give 30 ml by mouth two times daily  . ondansetron (ZOFRAN) 4 MG tablet Take 4 mg by mouth every 6 (six) hours as needed for nausea or vomiting.  . polyethylene glycol (MIRALAX / GLYCOLAX) packet Take 17 g by mouth daily.  . Probiotic Product (PROBIOTIC PO) Take 1 tablet by mouth daily.  . tizanidine (ZANAFLEX) 2 MG capsule Take 2 mg by mouth 3 (three) times daily.  . Vitamin D, Ergocalciferol, (DRISDOL) 50000 units CAPS capsule Take 50,000 Units by mouth every 30 (thirty) days.   Facility-Administered Encounter Medications as of 08/18/2017  Medication  . incobotulinumtoxinA (XEOMIN) 100 units injection 500 Units    Activities of Daily Living In your present state of health, do you have any difficulty performing the following activities: 08/18/2017 08/03/2017  Hearing? N N  Vision? N N  Difficulty concentrating or making decisions? N N  Walking or climbing stairs? Y Y  Dressing or bathing? Y Y  Doing errands, shopping? [place]  Preparing Food and eating ? Y -  Using the Toilet? Y -  In the past six months, have you accidently leaked urine? Y -  Do you have problems with loss of bowel control? Y -  Managing your Medications? Y -  Managing your Finances? Y -  Housekeeping or managing your Housekeeping? Y -  Some recent data might be hidden    Patient Care Team: [name], DO as PCP - [facility], [place] ([place])    Assessment:   This is a  routine wellness examination for [facility].  Exercise Activities and Dietary recommendations Current Exercise Habits: The patient does not participate in regular exercise at present, Exercise limited by: orthopedic condition(s)  Goals    None      Fall Risk Fall Risk  08/18/2017 07/13/2017 04/18/2017 08/09/2016 02/23/2016  Falls in the past year? No No No No No   Is the patient's home free of loose throw rugs in walkways, pet beds, electrical cords, etc?   yes      Grab bars in the bathroom? yes      Handrails on the stairs?   yes      Adequate lighting?   yes   Depression Screen PHQ 2/9 Scores 08/18/2017 07/13/2017 04/18/2017 08/09/2016  PHQ - 2 Score 0 0 0 0     Cognitive Function     6CIT Screen 08/18/2017 08/09/2016  What Year? 0 points 0 points  What month? 0 points 0 points  What time? 0 points 0 points  Count back from 20 0 points 0 points  Months in reverse 0 points 0 points  Repeat phrase 0 points 0 points  Total Score 0 0    Immunization History  Administered Date(s) Administered  . Influenza,inj,Quad PF,6+ Mos 03/06/2016  . Influenza-Unspecified 11/29/2016  . PPD Test 03/31/2016    Qualifies for Shingles Vaccine? Not in past records  Screening Tests Health Maintenance  Topic Date Due  . TETANUS/TDAP  04/15/2018 (Originally 07/16/1995)  . INFLUENZA VACCINE  09/01/2017  . HIV Screening  Completed  . PAP SMEAR  Discontinued    Cancer Screenings: Lung: Low Dose CT Chest recommended if Age 92-80 years, 30 pack-year currently smoking OR have quit w/in 15years. Patient does not qualify. Breast:  Up to date on Mammogram? Due age 6 Up to date of Bone Density/Dexa? Due age 50 Colorectal: due age 27  Additional Screenings:  Hepatitis C Screening: declined TDAP due: facility declined  Plan:    I have personally reviewed and addressed the Medicare Annual Wellness questionnaire and have noted the following in the patient's chart:  A. Medical and social history B. Use  of alcohol, tobacco or illicit drugs  C. Current medications and supplements D. Functional ability and status E.  Nutritional status F.  Physical activity G. Advance directives H. List of other physicians I.  Hospitalizations, surgeries, and ER visits in previous 12 months J.  [place] to include hearing, vision, cognitive, depression L. Referrals and appointments - none  In addition, I have reviewed and discussed with patient certain preventive protocols, quality metrics, and best practice recommendations. A written personalized care plan for preventive services as well as general preventive health recommendations were provided to patient.  See attached scanned questionnaire for additional information.   Signed,   Tyson Dense, RN Nurse Health Advisor  Patient Concerns: None

## 2017-08-18 NOTE — Patient Instructions (Signed)
Deanna Baldwin , Thank you for taking time to come for your Medicare Wellness Visit. I appreciate your ongoing commitment to your health goals. Please review the following plan we discussed and let me know if I can assist you in the future.   Screening recommendations/referrals: Colonoscopy due at age 42 Mammogram due at age 44 Bone Density due at age 77 Recommended yearly ophthalmology/optometry visit for glaucoma screening and checkup Recommended yearly dental visit for hygiene and checkup  Vaccinations: Influenza vaccine due at age 53 Pneumococcal vaccine due at age 72 Tdap vaccine due, facility declines Shingles vaccine not in past records    Advanced directives: in chart  Conditions/risks identified: none  Next appointment: Dr. [name] makes rounds  Preventive Care 40-64 Years, Female Preventive care refers to lifestyle choices and visits with your health care provider that can promote health and wellness. What does preventive care include?  A yearly physical exam. This is also called an annual well check.  Dental exams once or twice a year.  Routine eye exams. Ask your health care provider how often you should have your eyes checked.  Personal lifestyle choices, including:  Daily care of your teeth and gums.  Regular physical activity.  Eating a healthy diet.  Avoiding tobacco and drug use.  Limiting alcohol use.  Practicing safe sex.  Taking low-dose aspirin daily starting at age 68.  Taking vitamin and mineral supplements as recommended by your health care provider. What happens during an annual well check? The services and screenings done by your health care provider during your annual well check will depend on your age, overall health, lifestyle risk factors, and family history of disease. Counseling  Your health care provider may ask you questions about your:  Alcohol use.  Tobacco use.  Drug use.  Emotional well-being.  Home and relationship  well-being.  Sexual activity.  Eating habits.  Work and work Statistician.  Method of birth control.  Menstrual cycle.  Pregnancy history. Screening  You may have the following tests or measurements:  Height, weight, and BMI.  Blood pressure.  Lipid and cholesterol levels. These may be checked every 5 years, or more frequently if you are over 30 years old.  Skin check.  Lung cancer screening. You may have this screening every year starting at age 64 if you have a 30-pack-year history of smoking and currently smoke or have quit within the past 15 years.  Fecal occult blood test (FOBT) of the stool. You may have this test every year starting at age 66.  Flexible sigmoidoscopy or colonoscopy. You may have a sigmoidoscopy every 5 years or a colonoscopy every 10 years starting at age 54.  Hepatitis C blood test.  Hepatitis B blood test.  Sexually transmitted disease (STD) testing.  Diabetes screening. This is done by checking your blood sugar (glucose) after you have not eaten for a while (fasting). You may have this done every 1-3 years.  Mammogram. This may be done every 1-2 years. Talk to your health care provider about when you should start having regular mammograms. This may depend on whether you have a family history of breast cancer.  BRCA-related cancer screening. This may be done if you have a family history of breast, ovarian, tubal, or peritoneal cancers.  Pelvic exam and Pap test. This may be done every 3 years starting at age 32. Starting at age 27, this may be done every 5 years if you have a Pap test in combination with an HPV test.  Bone density scan. This is done to screen for osteoporosis. You may have this scan if you are at high risk for osteoporosis. Discuss your test results, treatment options, and if necessary, the need for more tests with your health care provider. Vaccines  Your health care provider may recommend certain vaccines, such  as:  Influenza vaccine. This is recommended every year.  Tetanus, diphtheria, and acellular pertussis (Tdap, Td) vaccine. You may need a Td booster every 10 years.  Zoster vaccine. You may need this after age 60.  Pneumococcal 13-valent conjugate (PCV13) vaccine. You may need this if you have certain conditions and were not previously vaccinated.  Pneumococcal polysaccharide (PPSV23) vaccine. You may need one or two doses if you smoke cigarettes or if you have certain conditions. Talk to your health care provider about which screenings and vaccines you need and how often you need them. This information is not intended to replace advice given to you by your health care provider. Make sure you discuss any questions you have with your health care provider. Document Released: 02/14/2015 Document Revised: 10/08/2015 Document Reviewed: 11/19/2014 Elsevier Interactive Patient Education  2017 [employer] Inc[employer]    Fall Prevention in the Home Falls can cause injuries. They can happen to people of all ages. There are many things you can do to make your home safe and to help prevent falls. What can I do on the outside of my home?  Regularly fix the edges of walkways and driveways and fix any cracks.  Remove anything that might make you trip as you walk through a door, such as a raised step or threshold.  Trim any bushes or trees on the path to your home.  Use bright outdoor lighting.  Clear any walking paths of anything that might make someone trip, such as rocks or tools.  Regularly check to see if handrails are loose or broken. Make sure that both sides of any steps have handrails.  Any raised decks and porches should have guardrails on the edges.  Have any leaves, snow, or ice cleared regularly.  Use sand or salt on walking paths during winter.  Clean up any spills in your garage right away. This includes oil or grease spills. What can I do in the bathroom?  Use night  lights.  Install grab bars by the toilet and in the tub and shower. Do not use towel bars as grab bars.  Use non-skid mats or decals in the tub or shower.  If you need to sit down in the shower, use a plastic, non-slip stool.  Keep the floor dry. Clean up any water that spills on the floor as soon as it happens.  Remove soap buildup in the tub or shower regularly.  Attach bath mats securely with double-sided non-slip rug tape.  Do not have throw rugs and other things on the floor that can make you trip. What can I do in the bedroom?  Use night lights.  Make sure that you have a light by your bed that is easy to reach.  Do not use any sheets or blankets that are too big for your bed. They should not hang down onto the floor.  Have a firm chair that has side arms. You can use this for support while you get dressed.  Do not have throw rugs and other things on the floor that can make you trip. What can I do in the kitchen?  Clean up any spills right away.  Avoid walking on   wet floors.  Keep items that you use a lot in easy-to-reach places.  If you need to reach something above you, use a strong step stool that has a grab bar.  Keep electrical cords out of the way.  Do not use floor polish or wax that makes floors slippery. If you must use wax, use non-skid floor wax.  Do not have throw rugs and other things on the floor that can make you trip. What can I do with my stairs?  Do not leave any items on the stairs.  Make sure that there are handrails on both sides of the stairs and use them. Fix handrails that are broken or loose. Make sure that handrails are as long as the stairways.  Check any carpeting to make sure that it is firmly attached to the stairs. Fix any carpet that is loose or worn.  Avoid having throw rugs at the top or bottom of the stairs. If you do have throw rugs, attach them to the floor with carpet tape.  Make sure that you have a light switch at the  top of the stairs and the bottom of the stairs. If you do not have them, ask someone to add them for you. What else can I do to help prevent falls?  Wear shoes that:  Do not have high heels.  Have rubber bottoms.  Are comfortable and fit you well.  Are closed at the toe. Do not wear sandals.  If you use a stepladder:  Make sure that it is fully opened. Do not climb a closed stepladder.  Make sure that both sides of the stepladder are locked into place.  Ask someone to hold it for you, if possible.  Clearly mark and make sure that you can see:  Any grab bars or handrails.  First and last steps.  Where the edge of each step is.  Use tools that help you move around (mobility aids) if they are needed. These include:  Canes.  Walkers.  Scooters.  Crutches.  Turn on the lights when you go into a dark area. Replace any light bulbs as soon as they burn out.  Set up your furniture so you have a clear path. Avoid moving your furniture around.  If any of your floors are uneven, fix them.  If there are any pets around you, be aware of where they are.  Review your medicines with your doctor. Some medicines can make you feel dizzy. This can increase your chance of falling. Ask your doctor what other things that you can do to help prevent falls. This information is not intended to replace advice given to you by your health care provider. Make sure you discuss any questions you have with your health care provider. Document Released: 11/14/2008 Document Revised: 06/26/2015 Document Reviewed: 02/22/2014 Elsevier Interactive Patient Education  2017 [employer].

## 2017-08-26 ENCOUNTER — Inpatient Hospital Stay: Admission: RE | Admit: 2017-08-26 | Payer: Medicare Other | Source: Ambulatory Visit

## 2017-09-06 ENCOUNTER — Other Ambulatory Visit: Payer: Self-pay | Admitting: Infectious Disease

## 2017-09-06 ENCOUNTER — Inpatient Hospital Stay: Admission: RE | Admit: 2017-09-06 | Payer: Medicare Other | Source: Ambulatory Visit

## 2017-09-06 DIAGNOSIS — R102 Pelvic and perineal pain: Secondary | ICD-10-CM

## 2017-09-09 ENCOUNTER — Ambulatory Visit (HOSPITAL_COMMUNITY)
Admission: RE | Admit: 2017-09-09 | Discharge: 2017-09-09 | Disposition: A | Discharge status: Home / Self Care | Payer: Medicare Other | Source: Ambulatory Visit | Attending: Infectious Disease | Admitting: Infectious Disease

## 2017-09-09 DIAGNOSIS — L89159 Pressure ulcer of sacral region, unspecified stage: Secondary | ICD-10-CM | POA: Insufficient documentation

## 2017-09-09 DIAGNOSIS — M86652 Other chronic osteomyelitis, left thigh: Secondary | ICD-10-CM | POA: Diagnosis not present

## 2017-09-09 DIAGNOSIS — M86152 Other acute osteomyelitis, left femur: Secondary | ICD-10-CM | POA: Diagnosis present

## 2017-09-09 MED ORDER — GADOBENATE DIMEGLUMINE 529 MG/ML IV SOLN
9.0000 mL | Freq: Once | INTRAVENOUS | Status: AC
  Administered 2017-09-09: 9 mL via INTRAVENOUS

## 2017-09-21 ENCOUNTER — Encounter: Payer: Self-pay | Admitting: Internal Medicine

## 2017-09-29 LAB — VITAMIN D 25 HYDROXY (VIT D DEFICIENCY, FRACTURES): VIT D 25 HYDROXY: 30.56

## 2017-09-29 LAB — LIPID PANEL
CHOLESTEROL: 178 (ref 0–200)
HDL: 47 (ref 35–70)
LDL Cholesterol: 115
Triglycerides: 83 (ref 40–160)

## 2017-09-29 LAB — TSH: TSH: 1.96 (ref 0.41–5.90)

## 2017-10-05 ENCOUNTER — Non-Acute Institutional Stay: Payer: Medicare Other | Admitting: Internal Medicine

## 2017-10-17 ENCOUNTER — Non-Acute Institutional Stay (SKILLED_NURSING_FACILITY): Payer: Medicare Other | Admitting: Internal Medicine

## 2017-10-17 ENCOUNTER — Encounter: Payer: Self-pay | Admitting: Internal Medicine

## 2017-10-17 DIAGNOSIS — G35 Multiple sclerosis: Secondary | ICD-10-CM

## 2017-10-17 DIAGNOSIS — E441 Mild protein-calorie malnutrition: Secondary | ICD-10-CM

## 2017-10-17 DIAGNOSIS — R627 Adult failure to thrive: Secondary | ICD-10-CM | POA: Diagnosis not present

## 2017-10-17 DIAGNOSIS — R945 Abnormal results of liver function studies: Secondary | ICD-10-CM

## 2017-10-17 DIAGNOSIS — G8220 Paraplegia, unspecified: Secondary | ICD-10-CM

## 2017-10-17 DIAGNOSIS — L89154 Pressure ulcer of sacral region, stage 4: Secondary | ICD-10-CM | POA: Diagnosis not present

## 2017-10-17 DIAGNOSIS — L89314 Pressure ulcer of right buttock, stage 4: Secondary | ICD-10-CM

## 2017-10-17 DIAGNOSIS — L89324 Pressure ulcer of left buttock, stage 4: Secondary | ICD-10-CM

## 2017-10-17 DIAGNOSIS — R7989 Other specified abnormal findings of blood chemistry: Secondary | ICD-10-CM

## 2017-10-17 NOTE — Progress Notes (Signed)
Location:  [facility] Room Number: 215 A Place of Service:  SNF (31) Provider:  [place], [place], DO  Patient Care Team: [name], DO as PCP - General (Internal Medicine) Center, [place] ([place])  Extended Emergency Contact Information Primary Emergency Contact: Dalbert Mayotte, [place] [address] Johnnette Litter of [place] Phone: [phone] Mobile Phone: [phone] Relation: Aunt Secondary Emergency Contact: Smith,Crystal  [place] Mobile Phone: [phone] Relation: Sister  Code Status:  Full Code Goals of care: Advanced Directive information Advanced Directives 10/17/2017  Does Patient Have a Medical Advance Directive? Yes  Type of Advance Directive Out of facility DNR (pink MOST or yellow form)  Does patient want to make changes to medical advance directive? No - Patient declined  Copy of [place] in Chart? -  Would patient like information on creating a medical advance directive? No - Patient declined  Pre-existing out of facility DNR order (yellow form or pink MOST form) Pink MOST form placed in chart (order not valid for inpatient use)     Chief Complaint  Patient presents with  . Medical Management of Chronic Issues    [facility]    HPI:  Pt is a 42 y.o. female seen today for medical management of chronic diseases.  She is c/a wound dsg change frequency. S/w nursing, pt's wounds healing and require change q3 days. Pt notified. No other concerns. Appetite excellent. Sleeps well. No f/c. LDL 115  Chronic constipation - stable on miralax daily and dulcolax 5 mg tab daily   Anemia of chronic disease - 2/2  iron deficiency; last PRBC transfusion  04/2016; Hgb 8.3; takes iron twice daily  Elevated liver enzymes (ast; alt; alk phos) -  CD 19 and 20 are neg; GGT and abdominal US neg; AST 174 (<--230); ALT 87 (<--156); alk phos 627 (<-559)  Tachycardia  with low blood pressure - stable on midodrine 2.5 mg three times daily  Chronic pain syndrome - stable on tylenol 1 gm three times daily; voltaren gel 2 gm to left shoulder three times daily; motrin 200 mg twice daily   MS - controlled on rituxan infusion every 6 mos;  followed by neurology; she has bilateral lower extremity splints. 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 albumin 3.2. She gets nutritional supplements per facility protocol; off remeron  Hypothyroidism - stable on levothyroxine; TSH 1.96    Past Medical History:  Diagnosis Date  . Buttock wound 03/03/2016  . Dysrhythmia    tachycardia  . MS (multiple sclerosis) ([facility])   . Neutropenia ([place])   . Pneumonia 02/2016  . Protein calorie malnutrition ([place])   . Sacral osteomyelitis ([place]) 05/19/2017  . Severe sepsis ([place]) 03/03/2016  . 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 UTI (urinary tract infection) 02/2016   Past Surgical History:  Procedure Laterality Date  . DIVERTING ILEOSTOMY N/A 09/03/2016   Procedure: ILEAL CONDUIT  URINARY DIVERSION OPEN;  Surgeon: Ardis Hughs, MD;  Location: [facility];  Service:  Urology;  Laterality: N/A;    Allergies  Allergen Reactions  . Natalizumab     Outpatient Encounter Medications as of 10/17/2017  Medication Sig  . acetaminophen (TYLENOL) 325 MG tablet Take 2 tablets (650 mg total) by mouth every 6 (six) hours as needed for mild pain (or Fever >/= 101).  . Ascorbic Acid (VITAMIN C PO) Take 500 mg by mouth 2 (two) times daily.   . bisacodyl (DULCOLAX) 5 MG EC tablet  Take 5 mg by mouth daily.  Marland Kitchen CALCIUM ALGINATE EX Apply to coccyx and right ischial daily  . Calcium Carbonate-Vitamin D3 600-400 MG-UNIT TABS Take 2 tablets by mouth 3 (three) times daily.  . Cyanocobalamin (VITAMIN B-12 PO) Take 1 tablet by mouth daily.  Marland Kitchen ENSURE (ENSURE) Take 237 mLs by mouth. With meals. Vanilla  . famotidine (PEPCID) 20 MG tablet Take 20 mg by mouth every morning.  . ferrous sulfate 325 (65 FE) MG tablet Take 325 mg by mouth daily with breakfast.  . guaiFENesin (MUCINEX) 600 MG 12 hr tablet Take 600 mg by mouth 2 (two) times daily.  Marland Kitchen HYDROcodone-acetaminophen (NORCO/VICODIN) 5-325 MG tablet Take 1 tablet by mouth every 6 (six) hours as needed for moderate pain.  Marland Kitchen ibuprofen (ADVIL,MOTRIN) 200 MG tablet Take 200 mg by mouth every 12 (twelve) hours as needed.  Marland Kitchen levothyroxine (SYNTHROID) 75 MCG tablet Take 75 mcg by mouth daily before breakfast.   . magnesium oxide (MAG-OX) 400 (241.3 Mg) MG tablet Take 1 tablet (400 mg total) by mouth 2 (two) times daily.  . midodrine (PROAMATINE) 2.5 MG tablet Take 1 tablet (2.5 mg total) by mouth 3 (three) times daily with meals.  . Multiple Vitamin (MULTIVITAMIN) tablet Take 1 tablet by mouth daily.  . Nutritional Supplements (NUTRITIONAL SUPPLEMENT PO) Regular Diet - Regular Texture  . Nutritional Supplements (PROMOD) LIQD Protein Liquid - give 30 ml by mouth two times daily  . ondansetron (ZOFRAN) 4 MG tablet Take 4 mg by mouth every 6 (six) hours as needed for nausea or vomiting.  . polyethylene glycol (MIRALAX / GLYCOLAX) packet Take 17 g by mouth daily.  . Probiotic Product (PROBIOTIC PO) Take 1 tablet by mouth daily.  . tizanidine (ZANAFLEX) 2 MG capsule Take 2 mg by mouth 3 (three) times daily.  . Vitamin D, Ergocalciferol, (DRISDOL) 50000 units CAPS capsule Take 50,000 Units by mouth every 30 (thirty) days.  . [DISCONTINUED] acetylcysteine (MUCOMYST) 20 % nebulizer solution Take 4 mLs by nebulization 2 (two) times daily. (Patient  not taking: Reported on 10/17/2017)  . [DISCONTINUED] calcium-vitamin D (OSCAL WITH D) 500-200 MG-UNIT tablet Take 2 tablets by mouth 4 (four) times daily. (Patient not taking: Reported on 10/17/2017)  . [DISCONTINUED] Nutritional Supplements (NUTRITIONAL SUPPLEMENT PO) Frozen Nutritional Treat with meals   Facility-Administered Encounter Medications as of 10/17/2017  Medication  . incobotulinumtoxinA (XEOMIN) 100 units injection 500 Units    Review of Systems  Musculoskeletal: Positive for gait problem.  Skin: Positive for wound.  All other systems reviewed and are negative.   Immunization History  Administered Date(s) Administered  . Influenza,inj,Quad PF,6+ Mos 03/06/2016  . Influenza-Unspecified 11/29/2016  . PPD Test 03/31/2016, 09/13/2017   Pertinent  Health Maintenance Due  Topic Date Due  . INFLUENZA VACCINE  12/14/2017 (Originally 09/01/2017)  . PAP SMEAR  Discontinued   Fall Risk  08/18/2017 07/13/2017 04/18/2017 08/09/2016 02/23/2016  Falls in the past year? _0    Functional Status Survey:    Vitals:  10/17/17 1011  Resp: 20  Temp: 98.2 F (36.8 C)  SpO2: 95%  Weight: 87 lb 1.6 oz (39.5 kg)  Height: _0  (1.803 m)  Blood pressure:    90/60 Body mass index is 12.15 kg/m. Physical Exam  Constitutional: She appears well-developed.  Frail appearing sitting up in bed in NAD  HENT:  Mouth/Throat: Oropharynx is clear and moist. No oropharyngeal exudate.  MMM; no oral thrush  Eyes: Pupils are equal, round, and reactive to light. No scleral icterus.  Neck: Neck supple. Carotid bruit is not present. No tracheal deviation present. No thyromegaly present.  Cardiovascular: Normal rate, regular rhythm and intact distal pulses. Exam reveals no gallop and no friction rub.  Murmur (1/6 SEM) heard. No LE edema b/l. no calf TTP.   Pulmonary/Chest: Effort normal and breath sounds normal. No stridor. No respiratory distress. She has no wheezes. She has no rales.    Abdominal: Soft. Normal appearance and bowel sounds are normal. She exhibits no distension and no mass. There is no hepatomegaly. There is no tenderness. There is no rigidity, no rebound and no guarding. No hernia.  Genitourinary:  Genitourinary Comments: Urostomy intact with foley cath DTG with clear yellow urine  Musculoskeletal: She exhibits edema and deformity (extremity contractures in lower>upper).  Lymphadenopathy:    She has no cervical adenopathy.  Neurological: She is alert. She has normal reflexes.  Skin: Skin is warm and dry. No rash noted.  Multiple wounds followed by wound care  Psychiatric: She has a normal mood and affect. Her behavior is normal. Judgment and thought content normal.    Labs reviewed: Recent Labs    04/09/17 0759  04/10/17 0536  04/23/17 1241  08/05/17 0627 08/07/17 0425 08/08/17 0740  NA 144  --  142   < > 143   < > 144 142 142  K 4.3  --  3.8   < > 4.6   < > 4.4 4.2 4.7  CL 110  --  107   < > 105   < > 112* 111 111  CO2 25  --  26   < > 26   < > _1 GLUCOSE 89  --  98   < > 88   < > 75 86 84  BUN 20  --  19   < > 28*   < > 21* 15 14  CREATININE 0.45  --  0.45   < > 0.46   < > 0.76 0.58 0.52  CALCIUM 6.4*   < > 7.6*   < > 6.7*   < > 7.9* 8.5* 8.8*  MG 1.6*  --  2.3  --  1.5*  --   --   --   --   PHOS 6.0*  --  4.6  --   --   --   --   --   --    < > = values in this interval not displayed.   Recent Labs    04/10/17 0536 04/23/17 1241  05/23/17 06/29/17 08/01/17 2347  AST 20 36   < > 230* 70* 174*  ALT 14 26   < > 156* 88* 87*  ALKPHOS 305* 284*   < > 559* 999* 627*  BILITOT 0.5 0.5  --   --   --  1.8*  PROT 6.5 7.1  --   --   --  7.1  ALBUMIN 2.9* 3.4*  --   --   --  3.2*   < > = values in this interval not displayed.   Recent Labs    05/30/17 06/29/17 08/01/17 2347  08/05/17 0627 08/07/17 0425 08/08/17 0740  WBC 6.0 5.5 12.5*   < > 7.8 6.8 7.9  NEUTROABS 4 4 10.6*  --   --   --   --   HGB 8.5* 9.0* 8.0*   < > 9.4* 8.0*  8.3*  HCT 25* 27* 26.4*   < > 29.1* 25.5* 26.2*  MCV  --   --  103.5*   < > 91.5 94.8 94.6  PLT 395 439* 487*   < > 440* 364 404*   < > = values in this interval not displayed.   Lab Results  Component Value Date   TSH 1.96 09/29/2017   Lab Results  Component Value Date   HGBA1C 5.5 06/24/2016   Lab Results  Component Value Date   CHOL 178 09/29/2017   HDL 47 09/29/2017   LDLCALC 115 09/29/2017   TRIG 83 09/29/2017    Significant Diagnostic Results in last 30 days:  No results found.  Assessment/Plan   ICD-10-CM   1. FTT (failure to thrive) in adult R62.7   2. Mild protein-calorie malnutrition (HCC) E44.1   3. Pressure injury of sacral region, stage 4 (HCC) L89.154    improving  4. Pressure sore of left ischium, stage 4 (HCC) L89.324    improving  5. Pressure injury of right ischium, stage 4 ([place]) L89.314    improving  6. 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 Elevated liver function tests R94.5     Cont current meds as ordered  PT/OT/ST as indicated  Wound care as ordered - change dsg q3 days per wound care  Urostomy care as indicated  Cont nutritional supplements as ordered  [facility] NP to follow  Will follow  Labs/tests ordered: cmp   Greycen Felter S. Perlie Gold  [facility] [address] [place], [place] [address] [phone] Cell (Monday-Friday 8 AM - 5 PM) [phone] After 5 PM and follow prompts

## 2017-10-18 LAB — HEPATIC FUNCTION PANEL
ALK PHOS: 598 — AB (ref 25–125)
ALT: 124 — AB (ref 7–35)
AST: 70 — AB (ref 13–35)
Bilirubin, Total: 0.2

## 2017-10-18 LAB — BASIC METABOLIC PANEL
BUN: 36 — AB (ref 4–21)
Creatinine: 0.7 (ref 0.5–1.1)
GLUCOSE: 92
POTASSIUM: 4.1 (ref 3.4–5.3)
SODIUM: 143 (ref 137–147)

## 2017-10-26 ENCOUNTER — Encounter: Payer: Self-pay | Admitting: Neurology

## 2017-10-26 ENCOUNTER — Ambulatory Visit (INDEPENDENT_AMBULATORY_CARE_PROVIDER_SITE_OTHER): Payer: Medicare Other | Admitting: Neurology

## 2017-10-26 VITALS — BP 100/65 | HR 104

## 2017-10-26 DIAGNOSIS — G8220 Paraplegia, unspecified: Secondary | ICD-10-CM | POA: Diagnosis not present

## 2017-10-26 DIAGNOSIS — G35 Multiple sclerosis: Secondary | ICD-10-CM | POA: Diagnosis not present

## 2017-10-26 MED ORDER — INCOBOTULINUMTOXINA 100 UNITS IM SOLR
500.0000 [IU] | INTRAMUSCULAR | Status: DC
  Administered 2017-10-26: 500 [IU] via INTRAMUSCULAR

## 2017-10-26 NOTE — Progress Notes (Signed)
**  Xeomin 100 units x 5 vials, NDC 0259-1610-01, Lot [id number], Exp 11/2019, office supply.//mck,rn**

## 2017-10-26 NOTE — Progress Notes (Signed)
PATIENT: Deanna Baldwin DOB: 11/27/1975  Chief Complaint  Patient presents with  . Paraplegia    Xeomin 100 units x 5 vials - office supply  . Multiple Sclerosis    Her last Ocrevus infusion was 01/02/18.       HISTORICAL  Deanna Baldwin is a 42 years old right-handed female, she is currently a resident at [facility] rehabilitation, she is accompanied by her physical therapist Deanna Baldwin  and staff, seen in refer by her primary care physician Dr.  Eulas Baldwin, Deanna Baldwin, for evaluation and treatment of spasticity due to multiple sclerosis, initial evaluation was on Jun 08 2016.  She lived at home with her 84 years old son and her aunt was her primary caregiver before she moved to current nursing home in 2017, her aunt visit her occasionally, facility is in charge of her medical care.  She was diagnosed with relapsing remitting multiple sclerosis since age 55, she initially presented with blurry vision, moderate fatigue, difficulty walking, she had frequent flareups over the years, eventually become wheelchair-bound since 2011,  She was under the care of neurologist at [facility] Dr. Ala Baldwin, I was able to review Dr. Pennie Baldwin note from 2013 to most recent  in February 2017, she had allergic reaction to Tysarbri IV infusion, was treated with rituximab since early 2013, 1000 mg every 6 months, she tolerated very well, infusion was in December 2016, because of her social status change, she was not able to get it every 6 months. For a while she was taking Ampyra for gait abnormality, but has stopped it because it is not helping her  Based on last neurology examination in February 2017, she was not ambulatory, in a power wheelchair, significant bilateral lower extremity spasticity, only has trace movement of bilateral leg, upper extremity motor strengths were well-preserved.  MRI of the brain and spine in May 2015, chronic demyelinating lesions, no enhancing lesion.  She has  multiple hospital admission recently, for frequent UTI, decubitus ulcer, with progressive increased weakness, also increased spasticity of bilateral lower extremity, decreased mobility to moving the chair, even to hold her sit up position,  I was able to review MRI of the cervical spine with and without contrast in May 2015, multifocal patchy area of T2/FLAIR signal abnormality in the entire cervical cord, visualized brainstem and upper thoracic spine, no contrast enhancement.  MRI of the brain 2015, stable November and distribution of numerous hyper intense flair lesions throughout the subcortical, deep and periventricular white matter, involvement of entire corpus callosum, as well as midbrain and pons, right cerebellum.  MRI of the brain 2012, questionable enhancement,  UPDATE Jan 19 2017:  MRI of the brain with and without contrast in June 2018: 1.   Multiple infratentorial and hemispheric T2/FLAIR hyperintense foci consistent with chronic demyelinating plaque associated with multiple sclerosis. None of the foci appeared to be acute. 2.   Moderate cortical atrophy and corpus callosum atrophy with mild brainstem atrophy. 3.   There are no acute findings.  MRI of the cervical spine without contrast   1.   Multiple T2 hyperintense foci within the spinal cord as detailed above.   IV access was not obtainable for contrast. 2.   Mild disc bulges at C3-C4, C4-C5 and C5-C6 but do not lead to any nerve root compression or central canal narrowing.  Patient received Rituxan 1000mg  on 10/11/16 from [facility] Dr. Lambert Baldwin office,.  She received Orevus 300mg  on 11/10/16 from intra-fusion.  Patient stated  [facility] home, wants to keep appointment with [place]  Laboratory evaluation on November 29 2016 from [facility], RDW was elevated at 17.4, CD19 less than 1%, 20, less than 1%, normal liver functional test  She is wheelchair-bound, no memory loss, significant visual difficulty, spastic  paraplegia  UPDATE Mar 09 2017: She came in for her first electrical stimulation guided xeomin injection for spastic paraplegia, we used 500 xeomin  UPDATE July 28 2017: She did receive her ocrelizumab infusion recently, she is here for repeat xeomin injection for spastic bilateral lower extremity,  UPDATE Sept 25 2019: Previous xeomin injection did help relaxing her leg, last ocrelizumab injection was on July 03 2017.   REVIEW OF SYSTEMS: Full 14 system review of systems performed and notable only for as above   ALLERGIES: Allergies  Allergen Reactions  . Natalizumab     HOME MEDICATIONS: Current Outpatient Medications  Medication Sig Dispense Refill  . acetaminophen (TYLENOL) 325 MG tablet Take 2 tablets (650 mg total) by mouth every 6 (six) hours as needed for mild pain (or Fever >/= 101).    . Ascorbic Acid (VITAMIN C PO) Take 500 mg by mouth 2 (two) times daily.     . bisacodyl (DULCOLAX) 5 MG EC tablet Take 5 mg by mouth daily.    Marland Kitchen CALCIUM ALGINATE EX Apply to coccyx and right ischial daily    . Calcium Carbonate-Vitamin D3 600-400 MG-UNIT TABS Take 2 tablets by mouth 3 (three) times daily.    . Cyanocobalamin (VITAMIN B-12 PO) Take 1 tablet by mouth daily.    Marland Kitchen ENSURE (ENSURE) Take 237 mLs by mouth. With meals. Vanilla    . famotidine (PEPCID) 20 MG tablet Take 20 mg by mouth every morning.    . ferrous sulfate 325 (65 FE) MG tablet Take 325 mg by mouth daily with breakfast.    . guaiFENesin (MUCINEX) 600 MG 12 hr tablet Take 600 mg by mouth 2 (two) times daily.    Marland Kitchen HYDROcodone-acetaminophen (NORCO/VICODIN) 5-325 MG tablet Take 1 tablet by mouth every 6 (six) hours as needed for moderate pain. 12 tablet 0  . ibuprofen (ADVIL,MOTRIN) 200 MG tablet Take 200 mg by mouth every 12 (twelve) hours as needed.    Marland Kitchen levothyroxine (SYNTHROID) 75 MCG tablet Take 75 mcg by mouth daily before breakfast.     . magnesium oxide (MAG-OX) 400 (241.3 Mg) MG tablet Take 1 tablet (400 mg total)  by mouth 2 (two) times daily. 28 tablet 0  . midodrine (PROAMATINE) 2.5 MG tablet Take 1 tablet (2.5 mg total) by mouth 3 (three) times daily with meals.    . Multiple Vitamin (MULTIVITAMIN) tablet Take 1 tablet by mouth daily.    . Nutritional Supplements (NUTRITIONAL SUPPLEMENT PO) Regular Diet - Regular Texture    . Nutritional Supplements (PROMOD) LIQD Protein Liquid - give 30 ml by mouth two times daily    . ocrelizumab (OCREVUS) 300 MG/10ML injection Inject 600 mg into the vein every 6 (six) months.    . ondansetron (ZOFRAN) 4 MG tablet Take 4 mg by mouth every 6 (six) hours as needed for nausea or vomiting.    . polyethylene glycol (MIRALAX / GLYCOLAX) packet Take 17 g by mouth daily.    . Probiotic Product (PROBIOTIC PO) Take 1 tablet by mouth daily.    . tizanidine (ZANAFLEX) 2 MG capsule Take 2 mg by mouth 3 (three) times daily.    . Vitamin D, Ergocalciferol, (DRISDOL) 50000 units CAPS capsule Take  50,000 Units by mouth every 30 (thirty) days.     No current facility-administered medications for this visit.     PAST MEDICAL HISTORY: Past Medical History:  Diagnosis Date  . Buttock wound 03/03/2016  . Dysrhythmia    tachycardia  . MS (multiple sclerosis) ([place])   . Neutropenia ([place])   . Pneumonia 02/2016  . Protein calorie malnutrition ([place])   . Sacral osteomyelitis ([place]) 05/19/2017  . Severe sepsis ([place]) 03/03/2016  . 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 UTI (urinary tract infection) 02/2016    PAST SURGICAL HISTORY: Past Surgical History:  Procedure Laterality Date  . DIVERTING ILEOSTOMY N/A 09/03/2016   Procedure: ILEAL CONDUIT  URINARY DIVERSION OPEN;  Surgeon: Ardis Hughs, MD;  Location: [facility];  Service: Urology;  Laterality: N/A;    FAMILY HISTORY: Family History  Problem Relation Age of Onset  . Mental illness Sister     SOCIAL HISTORY:  Social History   Socioeconomic History  . Marital status: Single    Spouse name: Not  on file  . Number of children: Not on file  . Years of education: Not on file  . Highest education level: Not on file  Occupational History  . Occupation: Disabled  Social Needs  . Financial resource strain: Not hard at all  . Food insecurity:    Worry: Never true    Inability: Never true  . Transportation needs:    Medical: No    Non-medical: No  Tobacco Use  . Smoking status: Former Smoker    Packs/day: 1.00    Years: 10.00    Pack years: 10.00    Types: Cigarettes    Last attempt to quit: 02/01/2010    Years since quitting: 7.7  . Smokeless tobacco: Never Used  Substance and Sexual Activity  . Alcohol use: No  . Drug use: No  . Sexual activity: Not on file  Lifestyle  . Physical activity:    Days per week: 0 days    Minutes per session: 0 min  . Stress: Not at all  Relationships  . Social connections:    Talks on phone: More than three times a week    Gets together: Once a week    Attends religious service: Never    Active member of club or organization: No    Attends meetings of clubs or organizations: Never    Relationship status: Never married  . Intimate partner violence:    Fear of current or ex partner: No    Emotionally abused: No    Physically abused: No    Forced sexual activity: No  Other Topics Concern  . Not on file  Social History Narrative   She is a resident at [facility].   Right-handed.     PHYSICAL EXAM   Vitals:   10/26/17 1004  BP: 100/65  Pulse: (!) 104    Not recorded      There is no height or weight on file to calculate BMI.  PHYSICAL EXAMNIATION:  Gen: NAD, conversant, well nourised, obese, well groomed                     Cardiovascular: Regular rate rhythm, no peripheral edema, warm, nontender. Eyes: Conjunctivae clear without exudates or hemorrhage Neck: Supple, no carotid bruits. Pulmonary: Clear to auscultation bilaterally   NEUROLOGICAL EXAM:  MENTAL STATUS: Speech/Cognition:  Cachexia, She sees  in wheelchair, cooperative on examination, visual acuity both eyes  20/200, rotatory horizontal nystagmus bilaterally   CRANIAL NERVES: CN II: Visual fields are full to confrontation. Pupils are round equal and briskly reactive to light. CN III, IV, VI: extraocular movement are normal. No ptosis. CN V: Facial sensation is intact to pinprick in all 3 divisions bilaterally. Corneal responses are intact.  CN VII: Face is symmetric with normal eye closure and smile. CN VIII: Hearing is normal to rubbing fingers CN IX, X: Palate elevates symmetrically. Phonation is normal. CN XI: Head turning and shoulder shrug are intact CN XII: Tongue is midline with normal movements and no atrophy.  MOTOR:  Free movement of bilateral upper extremity, significant spasticity of bilateral lower extremity, no antigravity movement, fixed contraction of bilateral knee, tendency for hip adduction, even with passive movement, there is significant limited range of motion, maximum right hip abduction is barely past midline, left side is about 30 degrees from midline fixed bilateral hip flexion  REFLEXES: Hyperreflexia  SENSORY: Intact to light touch, pinprick,  COORDINATION: No dysmetria of finger-to-nose    GAIT/STANCE: Deferred   DIAGNOSTIC DATA (LABS, IMAGING, TESTING) - I reviewed patient records, labs, notes, testing and imaging myself where available.   ASSESSMENT AND PLAN  Cigi Bega is a 42 y.o. female   Relapsing remitting multiple sclerosis  Allergic reaction to Yehuda Budd virus was positive with titer of 3.48 on Jun 24 2016  Her disease was able to stabilize by previous rituximab infusion, 1000 units every 6 months, last infusion was at the [facility] in September 2018   Ocrevus infusion in October 2018  Laboratory evaluations  Resum Ocrevus infusion 6 months from her last infusion in October 2018  Spastic paraplegia  Electrical stimulation guided xeomin injection, Initial injection  was on March 09, 2017, repeat injection on July 28, 2017, Sept 25 2019.    Right adductor longus 100 units  Right adductor magnus 100 units  Right semitendinosus 100 units   Left adductor longus 100 units  Left semitendinosus 50 units  Left adductor magnus 50 units   Marcial Pacas, M.D. Ph.D.  [facility] [address] [place], [place] [address] Ph: [phone] Fax: [phone]  CC: [name], DO

## 2017-11-17 ENCOUNTER — Encounter: Payer: Self-pay | Admitting: Internal Medicine

## 2017-11-17 ENCOUNTER — Non-Acute Institutional Stay (SKILLED_NURSING_FACILITY): Payer: Medicare Other | Admitting: Internal Medicine

## 2017-11-17 DIAGNOSIS — R627 Adult failure to thrive: Secondary | ICD-10-CM

## 2017-11-17 DIAGNOSIS — G35 Multiple sclerosis: Secondary | ICD-10-CM

## 2017-11-17 DIAGNOSIS — R7989 Other specified abnormal findings of blood chemistry: Secondary | ICD-10-CM

## 2017-11-17 DIAGNOSIS — G8220 Paraplegia, unspecified: Secondary | ICD-10-CM

## 2017-11-17 DIAGNOSIS — R945 Abnormal results of liver function studies: Secondary | ICD-10-CM

## 2017-11-17 DIAGNOSIS — E441 Mild protein-calorie malnutrition: Secondary | ICD-10-CM

## 2017-11-17 DIAGNOSIS — Z936 Other artificial openings of urinary tract status: Secondary | ICD-10-CM

## 2017-11-17 DIAGNOSIS — R5381 Other malaise: Secondary | ICD-10-CM | POA: Diagnosis not present

## 2017-11-17 NOTE — Progress Notes (Signed)
Patient ID: Deanna Baldwin, female   DOB: [birth year], 42 y.o.   MRN: [id number]   Location:  [facility] Room Number: 215 A Place of Service:  SNF (31) Provider:  [place], Coyle, DO  Patient Care Team: [name], DO as PCP - General (Internal Medicine) Center, [place] ([place])  Extended Emergency Contact Information Primary Emergency Contact: Dalbert Mayotte, [place] [address] Johnnette Litter of [place] Phone: [phone] Mobile Phone: [phone] Relation: Aunt Secondary Emergency Contact: Smith,Crystal  [place] Mobile Phone: [phone] Relation: Sister  Code Status:  Full code Goals of care: Advanced Directive information Advanced Directives 11/17/2017  Does Patient Have a Medical Advance Directive? Yes  Type of Advance Directive Out of facility DNR (pink MOST or yellow form)  Does patient want to make changes to medical advance directive? No - Patient declined  Copy of [place] in Chart? -  Would patient like information on creating a medical advance directive? No - Patient declined  Pre-existing out of facility DNR order (yellow form or pink MOST form) Pink MOST form placed in chart (order not valid for inpatient use)     Chief Complaint  Patient presents with  . Medical Management of Chronic Issues    [facility]    HPI:  Pt is a 41 y.o. female seen today for medical management of chronic diseases.  She has no concerns. Wounds healing. No f/c. No falls. Appetite ok. Sleeps well. No nursing issues.  Chronic constipation - stable on miralax daily and dulcolax 5 mg tab daily   Anemia of chronic disease - 2/2  iron deficiency; last PRBC transfusion  04/2016; Hgb 8.3; takes iron twice daily  Elevated liver enzymes (ast; alt; alk phos) -  CD 19 and 20 are neg; GGT and abdominal US neg; AST 70 (<--174); ALT 124 (<--87); alk phos 598 (<-627); albumin  4.3  Tachycardia with low blood pressure - stable on midodrine 2.5 mg three times daily  Chronic pain syndrome - stable on tylenol 1 gm three times daily; voltaren gel 2 gm to left shoulder three times daily; motrin 200 mg twice daily   MS - controlled on ocrevus infusion every 6 mos;  followed by neurology; she has bilateral lower extremity splints. 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 albumin 3.2. She gets nutritional supplements per facility protocol; off remeron  Hypothyroidism - stable on levothyroxine; TSH 1.96  Past Medical History:  Diagnosis Date  . Buttock wound 03/03/2016  . Dysrhythmia    tachycardia  . MS (multiple sclerosis) ([place])   . Neutropenia ([place])   . Pneumonia 02/2016  . Protein calorie malnutrition ([place])   . Sacral osteomyelitis ([place]) 05/19/2017  . Severe sepsis ([place]) 03/03/2016  . 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 UTI (urinary tract infection) 02/2016   Past Surgical History:  Procedure Laterality Date  . DIVERTING ILEOSTOMY N/A 09/03/2016   Procedure: ILEAL CONDUIT  URINARY DIVERSION OPEN;  Surgeon: Ardis Hughs, MD;  Location: [facility];  Service: Urology;  Laterality: N/A;    Allergies  Allergen Reactions  . Natalizumab     Outpatient Encounter Medications as of 11/17/2017  Medication Sig  . acetaminophen (TYLENOL) 325 MG tablet Take 2 tablets (650 mg total) by mouth every 6 (six) hours as needed for mild pain (or Fever >/= 101).  . Ascorbic Acid (VITAMIN C PO) Take 500 mg by mouth 2 (two) times daily.   . bisacodyl (DULCOLAX)  5 MG EC tablet Take 5 mg by mouth daily.  . Calcium Carbonate-Vitamin D3 600-400 MG-UNIT TABS Take 2 tablets by mouth 3 (three) times daily.  . Cyanocobalamin (VITAMIN B-12 PO) Take 1 tablet by mouth daily.  Marland Kitchen ENSURE (ENSURE) Take 237 mLs by mouth. With meals. Vanilla  . famotidine (PEPCID) 20 MG tablet Take 20 mg by mouth every morning.  . ferrous sulfate 325 (65 FE) MG tablet Take 325 mg by mouth daily with breakfast.  . guaiFENesin (MUCINEX) 600 MG 12 hr tablet Take 600 mg by mouth 2 (two) times daily.  Marland Kitchen HYDROcodone-acetaminophen (NORCO/VICODIN) 5-325 MG tablet Take 1 tablet by mouth every 6 (six) hours as needed for moderate pain.  Marland Kitchen ibuprofen (ADVIL,MOTRIN) 200 MG tablet Take 200 mg by mouth every 12 (twelve) hours as needed.  Marland Kitchen levothyroxine (SYNTHROID) 75 MCG tablet Take 75 mcg by mouth daily before breakfast.   . magnesium oxide (MAG-OX) 400 (241.3 Mg) MG tablet Take 1 tablet (400 mg total) by mouth 2 (two) times daily.  . midodrine (PROAMATINE) 2.5 MG tablet Take 1 tablet (2.5 mg total) by mouth 3 (three) times daily with meals.  . Multiple Vitamin (MULTIVITAMIN) tablet Take 1 tablet by mouth daily.  . Nutritional Supplements (NUTRITIONAL SUPPLEMENT PO) Regular Diet - Regular Texture  . Nutritional Supplements (PROMOD) LIQD Protein Liquid - give 30 ml by mouth two times daily  . ondansetron (ZOFRAN) 4 MG tablet Take 4 mg by mouth every 6 (six) hours as needed for nausea or vomiting.  . polyethylene glycol (MIRALAX / GLYCOLAX) packet Take 17 g by mouth daily.  . Probiotic Product (PROBIOTIC PO) Take 1 tablet by mouth daily.  . tizanidine (ZANAFLEX) 2 MG capsule Take 2 mg by mouth 3 (three) times daily.  . Vitamin D, Ergocalciferol, (DRISDOL) 50000 units CAPS capsule Take 50,000 Units by mouth every 30 (thirty) days.  Marland Kitchen zinc gluconate 50 MG tablet Take 50 mg by mouth.  Marland Kitchen ocrelizumab (OCREVUS) 300 MG/10ML injection Inject 600 mg into the vein every 6 (six) months.  . [DISCONTINUED]  CALCIUM ALGINATE EX Apply to coccyx and right ischial daily   Facility-Administered Encounter Medications as of 11/17/2017  Medication  . incobotulinumtoxinA (XEOMIN) 100 units injection 500 Units    Review of Systems  Musculoskeletal: Positive for gait problem.  Skin: Positive for wound.  All other systems reviewed and are negative.   Immunization History  Administered Date(s) Administered  . Influenza,inj,Quad PF,6+ Mos 03/06/2016  . Influenza-Unspecified 11/29/2016  . PPD Test 03/31/2016, 09/13/2017   Pertinent  Health Maintenance Due  Topic Date Due  . INFLUENZA VACCINE  12/14/2017 (Originally 09/01/2017)  . PAP SMEAR  Discontinued   Fall Risk  08/18/2017 07/13/2017 04/18/2017 08/09/2016 02/23/2016  Falls in the past year? No No No No No   Functional Status Survey:    Vitals:   11/17/17 1025  Weight: 89 lb 9.6 oz (40.6 kg)  Height: '5\' 11"'  (1.803 m)   Body mass index is 12.5 kg/m. Physical Exam  Constitutional: She is oriented to  person, place, and time. She appears well-developed and well-nourished.  Frail appearing in NAD, lying in bed  HENT:  Mouth/Throat: Oropharynx is clear and moist. No oropharyngeal exudate.  MMM; no oral thrush  Eyes: Pupils are equal, round, and reactive to light. No scleral icterus.  Neck: Neck supple. Carotid bruit is not present. No tracheal deviation present.  Cardiovascular: Normal rate, regular rhythm and intact distal pulses. Exam reveals no gallop and no friction rub.  Murmur (1/6 SEM) heard. No LE edema b/l. no calf TTP.   Pulmonary/Chest: Effort normal and breath sounds normal. No stridor. No respiratory distress. She has no wheezes. She has no rales.  Abdominal: Soft. Normal appearance and bowel sounds are normal. She exhibits no distension and no mass. There is no hepatomegaly. There is no tenderness. There is no rigidity, no rebound and no guarding. No hernia.  Musculoskeletal: She exhibits deformity (contractures in LE>UE).   Lymphadenopathy:    She has no cervical adenopathy.  Neurological: She is alert and oriented to person, place, and time. She has normal reflexes.  Skin: Skin is warm and dry. No rash noted.  Psychiatric: She has a normal mood and affect. Her behavior is normal. Judgment and thought content normal.    Labs reviewed: Recent Labs    04/09/17 0759  04/10/17 0536  04/23/17 1241  08/05/17 0627 08/07/17 0425 08/08/17 0740  NA 144  --  142   < > 143   < > 144 142 142  K 4.3  --  3.8   < > 4.6   < > 4.4 4.2 4.7  CL 110  --  107   < > 105   < > 112* 111 111  CO2 25  --  26   < > 26   < > '24 24 24  ' GLUCOSE 89  --  98   < > 88   < > 75 86 84  BUN 20  --  19   < > 28*   < > 21* 15 14  CREATININE 0.45  --  0.45   < > 0.46   < > 0.76 0.58 0.52  CALCIUM 6.4*   < > 7.6*   < > 6.7*   < > 7.9* 8.5* 8.8*  MG 1.6*  --  2.3  --  1.5*  --   --   --   --   PHOS 6.0*  --  4.6  --   --   --   --   --   --    < > = values in this interval not displayed.   Recent Labs    04/10/17 0536 04/23/17 1241  05/23/17 06/29/17 08/01/17 2347  AST 20 36   < > 230* 70* 174*  ALT 14 26   < > 156* 88* 87*  ALKPHOS 305* 284*   < > 559* 999* 627*  BILITOT 0.5 0.5  --   --   --  1.8*  PROT 6.5 7.1  --   --   --  7.1  ALBUMIN 2.9* 3.4*  --   --   --  3.2*   < > = values in this interval not displayed.   Recent Labs    05/30/17 06/29/17 08/01/17 2347  08/05/17 0627 08/07/17 0425 08/08/17 0740  WBC 6.0 5.5 12.5*   < > 7.8 6.8 7.9  NEUTROABS 4 4 10.6*  --   --   --   --   HGB 8.5*  9.0* 8.0*   < > 9.4* 8.0* 8.3*  HCT 25* 27* 26.4*   < > 29.1* 25.5* 26.2*  MCV  --   --  103.5*   < > 91.5 94.8 94.6  PLT 395 439* 487*   < > 440* 364 404*   < > = values in this interval not displayed.   Lab Results  Component Value Date   TSH 1.96 09/29/2017   Lab Results  Component Value Date   HGBA1C 5.5 06/24/2016   Lab Results  Component Value Date   CHOL 178 09/29/2017   HDL 47 09/29/2017   LDLCALC 115 09/29/2017    TRIG 83 09/29/2017    Significant Diagnostic Results in last 30 days:  No results found.  Assessment/Plan   ICD-10-CM   1. Physical deconditioning R53.81   2. FTT (failure to thrive) in adult R62.7   3. Elevated liver function tests R94.5   4. 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[place]) Z93.6   6. Mild protein-calorie malnutrition ([place]) E44.1    resolved    Cont current meds as ordered  Wound care as ordered  Cont nutritional supplements as indicated  F/u with neurology as scheduled  [facility] NP to follow  Will follow  Labs/tests ordered: follow liver enzymes   Tynia Wiers S. Perlie Gold  [facility] [address] [place], [place] [address] [phone] Cell (Monday-Friday 8 AM - 5 PM) [phone] After 5 PM and follow prompts

## 2018-01-19 ENCOUNTER — Telehealth: Payer: Self-pay | Admitting: Neurology

## 2018-01-19 ENCOUNTER — Encounter: Payer: Self-pay | Admitting: Neurology

## 2018-01-19 ENCOUNTER — Ambulatory Visit (INDEPENDENT_AMBULATORY_CARE_PROVIDER_SITE_OTHER): Payer: Medicare Other | Admitting: Neurology

## 2018-01-19 VITALS — BP 101/71 | HR 108

## 2018-01-19 DIAGNOSIS — G8220 Paraplegia, unspecified: Secondary | ICD-10-CM | POA: Diagnosis not present

## 2018-01-19 DIAGNOSIS — G35 Multiple sclerosis: Secondary | ICD-10-CM | POA: Diagnosis not present

## 2018-01-19 NOTE — Telephone Encounter (Signed)
Noted - reminder set up to check with patient and Dr. Krista Blue the first of May 2020 prior to her infusion being due in June 2020.

## 2018-01-19 NOTE — Progress Notes (Signed)
**  Xeomin 100 units x 5 vials, NDC 0259-1610-01, Lot [id number], Exp 05/2020, office supply.//mck,rn**

## 2018-01-19 NOTE — Progress Notes (Signed)
PATIENT: Deanna Baldwin DOB: 07/29/1975  Chief Complaint  Patient presents with  . Paraplegia    Xeomin 100 units x 5 vials - office supply  . Multiple Sclerosis    Ocrevus     HISTORICAL  Deanna Baldwin is a 42 years old right-handed female, she is currently a resident at [facility] and rehabilitation, she is accompanied by her physical therapist [name]  and staff, seen in refer by her primary care physician Dr.  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  She lived at home with her 44 years old son and her aunt was her primary caregiver before she moved to current nursing home in 2017, her aunt visit her occasionally, facility is in charge of her medical care.  She was diagnosed with relapsing remitting multiple sclerosis since age 70, she initially presented with blurry vision, moderate fatigue, difficulty walking, she had frequent flareups over the years, eventually become wheelchair-bound since 2011,  She was under the care of neurologist at [facility] Dr. [name], I was able to review Dr. [name] note from 2013 to most recent  in February 2017, she had allergic reaction to Tysarbri IV infusion, was treated with rituximab since early 2013, 1000 mg every 6 months, she tolerated very well, infusion was in December 2016, because of her social status change, she was not able to get it every 6 months. For a while she was taking Ampyra for gait abnormality, but has stopped it because it is not helping her  Based on last neurology examination in February 2017, she was not ambulatory, in a power wheelchair, significant bilateral lower extremity spasticity, only has trace movement of bilateral leg, upper extremity motor strengths were well-preserved.  MRI of the brain and spine in May 2015, chronic demyelinating lesions, no enhancing lesion.  She has multiple hospital admission recently,  for frequent UTI, decubitus ulcer, with progressive increased weakness, also increased spasticity of bilateral lower extremity, decreased mobility to moving the chair, even to hold her sit up position,  I was able to review MRI of the cervical spine with and without contrast in May 2015, multifocal patchy area of T2/FLAIR signal abnormality in the entire cervical cord, visualized brainstem and upper thoracic spine, no contrast enhancement.  MRI of the brain 2015, stable November and distribution of numerous hyper intense flair lesions throughout the subcortical, deep and periventricular white matter, involvement of entire corpus callosum, as well as midbrain and pons, right cerebellum.  MRI of the brain 2012, questionable enhancement,  UPDATE Jan 19 2017: MRI of the brain with and without contrast in June 2018: 1.   Multiple infratentorial and hemispheric T2/FLAIR hyperintense foci consistent with chronic demyelinating plaque associated with multiple sclerosis. None of the foci appeared to be acute. 2.   Moderate cortical atrophy and corpus callosum atrophy with mild brainstem atrophy. 3.   There are no acute findings.  MRI of the cervical spine without contrast   1.   Multiple T2 hyperintense foci within the spinal cord as detailed above.   IV access was not obtainable for contrast. 2.   Mild disc bulges at C3-C4, C4-C5 and C5-C6 but do not lead to any nerve root compression or central canal narrowing.  Patient received Rituxan 1000mg  on 10/11/16 from [facility] Dr. [name] office,.  She received Orevus 300mg  on 11/10/16 from intra-fusion.  Patient stated [place] [facility] home, wants to keep appointment with  [place]  Laboratory evaluation on November 29 2016 from [facility], RDW was elevated at 17.4, CD19 less than 1%, 20, less than 1%, normal liver functional test  She is wheelchair-bound, no memory loss, significant visual difficulty, spastic paraplegia  UPDATE Mar 09 2017: She came  in for her first electrical stimulation guided xeomin injection for spastic paraplegia, we used 500 xeomin  UPDATE July 28 2017: She did receive her ocrelizumab infusion recently, she is here for repeat xeomin injection for spastic bilateral lower extremity,  UPDATE Sept 25 2019: Previous xeomin injection did help relaxing her leg, last ocrelizumab injection was on July 03 2017.   UPDATE Jan 19 2018: She was not sure about the benefit of previous Xeomin injection, she has fixed contraction of bilateral lower extremity, pain with passive movement,  Laboratory evaluation in September 2019 continue show abnormal liver functional test, rising alkaline phosphate 598, ALT 124, AST 70,  Last ocrelizumab infusion was in December 2019, will repeat laboratory evaluation, if there is significant abnormality, will hold off ocrelizumab infusion,  REVIEW OF SYSTEMS: Full 14 system review of systems performed and notable only for as above   ALLERGIES: Allergies  Allergen Reactions  . Natalizumab     HOME MEDICATIONS: Current Outpatient Medications  Medication Sig Dispense Refill  . acetaminophen (TYLENOL) 325 MG tablet Take 2 tablets (650 mg total) by mouth every 6 (six) hours as needed for mild pain (or Fever >/= 101).    . Ascorbic Acid (VITAMIN C PO) Take 500 mg by mouth 2 (two) times daily.     . bisacodyl (DULCOLAX) 5 MG EC tablet Take 5 mg by mouth daily.    . Calcium Carbonate-Vitamin D3 600-400 MG-UNIT TABS Take 2 tablets by mouth 3 (three) times daily.    . Cyanocobalamin (VITAMIN B-12 PO) Take 1 tablet by mouth daily.    Marland Kitchen ENSURE (ENSURE) Take 237 mLs by mouth. With meals. Vanilla    . famotidine (PEPCID) 20 MG tablet Take 20 mg by mouth every morning.    . ferrous sulfate 325 (65 FE) MG tablet Take 325 mg by mouth daily with breakfast.    . guaiFENesin (MUCINEX) 600 MG 12 hr tablet Take 600 mg by mouth 2 (two) times daily.    Marland Kitchen HYDROcodone-acetaminophen (NORCO/VICODIN) 5-325 MG tablet  Take 1 tablet by mouth every 6 (six) hours as needed for moderate pain. 12 tablet 0  . ibuprofen (ADVIL,MOTRIN) 200 MG tablet Take 200 mg by mouth every 12 (twelve) hours as needed.    . incobotulinumtoxinA (XEOMIN) 100 units SOLR injection Inject 500 Units into the muscle every 3 (three) months.    . levothyroxine (SYNTHROID) 75 MCG tablet Take 75 mcg by mouth daily before breakfast.     . magnesium oxide (MAG-OX) 400 (241.3 Mg) MG tablet Take 1 tablet (400 mg total) by mouth 2 (two) times daily. 28 tablet 0  . midodrine (PROAMATINE) 2.5 MG tablet Take 1 tablet (2.5 mg total) by mouth 3 (three) times daily with meals.    . Multiple Vitamin (MULTIVITAMIN) tablet Take 1 tablet by mouth daily.    . Nutritional Supplements (NUTRITIONAL SUPPLEMENT PO) Regular Diet - Regular Texture    . Nutritional Supplements (PROMOD) LIQD Protein Liquid - give 30 ml by mouth two times daily    . ocrelizumab (OCREVUS) 300 MG/10ML injection Inject 600 mg into the vein every 6 (six) months.    . ondansetron (ZOFRAN) 4 MG tablet Take 4 mg by mouth every 6 (six)  hours as needed for nausea or vomiting.    . polyethylene glycol (MIRALAX / GLYCOLAX) packet Take 17 g by mouth daily.    . Probiotic Product (PROBIOTIC PO) Take 1 tablet by mouth daily.    . tizanidine (ZANAFLEX) 2 MG capsule Take 2 mg by mouth 3 (three) times daily.    . Vitamin D, Ergocalciferol, (DRISDOL) 50000 units CAPS capsule Take 50,000 Units by mouth every 30 (thirty) days.     No current facility-administered medications for this visit.     PAST MEDICAL HISTORY: Past Medical History:  Diagnosis Date  . Buttock wound 03/03/2016  . Dysrhythmia    tachycardia  . MS (multiple sclerosis) ([place])   . Neutropenia ([place])   . Pneumonia 02/2016  . Protein calorie malnutrition ([place])   . Sacral osteomyelitis ([place]) 05/19/2017  . Severe sepsis ([place]) 03/03/2016  . 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 UTI (urinary tract infection)  02/2016    PAST SURGICAL HISTORY: Past Surgical History:  Procedure Laterality Date  . DIVERTING ILEOSTOMY N/A 09/03/2016   Procedure: ILEAL CONDUIT  URINARY DIVERSION OPEN;  Surgeon: Ardis Hughs, MD;  Location: [facility];  Service: Urology;  Laterality: N/A;    FAMILY HISTORY: Family History  Problem Relation Age of Onset  . Mental illness Sister     SOCIAL HISTORY:  Social History   Socioeconomic History  . Marital status: Single    Spouse name: Not on file  . Number of children: Not on file  . Years of education: Not on file  . Highest education level: Not on file  Occupational History  . Occupation: Disabled  Social Needs  . Financial resource strain: Not hard at all  . Food insecurity:    Worry: Never true    Inability: Never true  . Transportation needs:    Medical: No    Non-medical: No  Tobacco Use  . Smoking status: Former Smoker    Packs/day: 1.00    Years: 10.00    Pack years: 10.00    Types: Cigarettes    Last attempt to quit: 02/01/2010    Years since quitting: 7.9  . Smokeless tobacco: Never Used  Substance and Sexual Activity  . Alcohol use: No  . Drug use: No  . Sexual activity: Not on file  Lifestyle  . Physical activity:    Days per week: 0 days    Minutes per session: 0 min  . Stress: Not at all  Relationships  . Social connections:    Talks on phone: More than three times a week    Gets together: Once a week    Attends religious service: Never    Active member of club or organization: No    Attends meetings of clubs or organizations: Never    Relationship status: Never married  . Intimate partner violence:    Fear of current or ex partner: No    Emotionally abused: No    Physically abused: No    Forced sexual activity: No  Other Topics Concern  . Not on file  Social History Narrative   She is a resident at [facility].   Right-handed.     PHYSICAL EXAM   Vitals:   01/19/18 1047  BP: 101/71  Pulse: (!) 108      Not recorded      There is no height or weight on file to calculate BMI.  PHYSICAL EXAMNIATION:  Gen: NAD, conversant, well nourised, obese,  well groomed                     Cardiovascular: Regular rate rhythm, no peripheral edema, warm, nontender. Eyes: Conjunctivae clear without exudates or hemorrhage Neck: Supple, no carotid bruits. Pulmonary: Clear to auscultation bilaterally   NEUROLOGICAL EXAM:  MENTAL STATUS: Speech/Cognition:  Cachexia, She sees in wheelchair, cooperative on examination, visual acuity both eyes 20/200, rotato vertical nystagmus bilaterally   CRANIAL NERVES: CN II: Visual fields are full to confrontation. Pupils are round equal and briskly reactive to light. CN III, IV, VI: extraocular movement are normal. No ptosis. CN V: Facial sensation is intact to pinprick in all 3 divisions bilaterally. Corneal responses are intact.  CN VII: Face is symmetric with normal eye closure and smile. CN VIII: Hearing is normal to rubbing fingers CN IX, X: Palate elevates symmetrically. Phonation is normal. CN XI: Head turning and shoulder shrug are intact CN XII: Tongue is midline with normal movements and no atrophy.  MOTOR:  Free movement of bilateral upper extremity, significant spasticity of bilateral lower extremity, no antigravity movement, fixed contraction of bilateral knee, tendency for hip adduction, even with passive movement, there is significant limited range of motion, maximum right hip abduction is barely past midline, left side is about 30 degrees from midline fixed bilateral hip flexion  REFLEXES: Hyperreflexia  SENSORY: Intact to light touch, pinprick,  COORDINATION: No dysmetria of finger-to-nose    GAIT/STANCE: Deferred   DIAGNOSTIC DATA (LABS, IMAGING, TESTING) - I reviewed patient records, labs, notes, testing and imaging myself where available.   ASSESSMENT AND PLAN  Nala Kachel is a 42 y.o. female   Relapsing remitting multiple  sclerosis  Allergic reaction to Yehuda Budd virus was positive with titer of 3.48 on Jun 24 2016  Her disease was able to stabilize by previous rituximab infusion, 1000 units every 6 months, last infusion was at the [facility] in September 2018   Ocrevus infusion started in October 2018, last infusion was in December 2019  Laboratory evaluation showed abnormal liver functional test, will repeat laboratory evaluations,  If there is significant abnormality, will hold next ocrelizumab infusion,  Spastic paraplegia  Electrical stimulation guided xeomin injection, Initial injection was on March 09, 2017, repeat injection on July 28, 2017, Sept 25 2019 and today January 19, 2018,  She has fixed contraction of bilateral lower extremity, have asked patient and her staff to evaluate the benefit, if there is no significant change in her pain, will not continue injection    Right adductor longus 100 units  Right adductor magnus 100 units  Right semitendinosus 100 units   Left adductor longus 100 units  Left semitendinosus 50 units  Left adductor magnus 50 units   Marcial Pacas, M.D. Ph.D.  [facility] [address] [place], [place] [address] Ph: [phone] Fax: [phone]  CC: [name], DO

## 2018-01-19 NOTE — Telephone Encounter (Addendum)
Deanna Pacas, MD 30 minutes ago (9:18 AM)      Please call patient, lab evaluation showed abnormal LFT is at her baseline,   She should be able to continue Ocrelizamub as previously scheduled.

## 2018-01-19 NOTE — Telephone Encounter (Signed)
Ocrevus infusion started in October 2018, last infusion was in December 2019     Laboratory evaluation showed abnormal liver functional test, will repeat laboratory evaluations,             If there is significant abnormality, will hold next ocrelizumab infusion,  She has fixed contraction of bilateral lower extremity, have asked patient and her staff to evaluate the benefit, if there is no significant change in her pain, will not continue injection

## 2018-01-19 NOTE — Addendum Note (Signed)
Addended by: Marcial Pacas on: 01/19/2018 12:03 PM   Modules accepted: Level of Service

## 2018-01-20 ENCOUNTER — Telehealth: Payer: Self-pay | Admitting: Neurology

## 2018-01-20 LAB — COMPREHENSIVE METABOLIC PANEL
A/G RATIO: 1.6 (ref 1.2–2.2)
ALT: 36 IU/L — AB (ref 0–32)
AST: 17 IU/L (ref 0–40)
Albumin: 4.1 g/dL (ref 3.5–5.5)
Alkaline Phosphatase: 353 IU/L — ABNORMAL HIGH (ref 39–117)
BUN/Creatinine Ratio: 39 — ABNORMAL HIGH (ref 9–23)
BUN: 27 mg/dL — AB (ref 6–24)
Bilirubin Total: <0.2 mg/dL (ref 0.0–1.2)
CALCIUM: 9.5 mg/dL (ref 8.7–10.2)
CO2: 25 mmol/L (ref 20–29)
Chloride: 103 mmol/L (ref 96–106)
Creatinine, Ser: 0.69 mg/dL (ref 0.57–1.00)
GFR calc Af Amer: 125 mL/min/{1.73_m2} (ref >59)
GFR, EST NON AFRICAN AMERICAN: 108 mL/min/{1.73_m2} (ref >59)
GLUCOSE: 90 mg/dL (ref 65–99)
Globulin, Total: 2.6 g/dL (ref 1.5–4.5)
POTASSIUM: 4.1 mmol/L (ref 3.5–5.2)
Sodium: 144 mmol/L (ref 134–144)
Total Protein: 6.7 g/dL (ref 6.0–8.5)

## 2018-01-20 LAB — CBC WITH DIFFERENTIAL/PLATELET
BASOS: 1 %
Basophils Absolute: 0.1 10*3/uL (ref 0.0–0.2)
EOS (ABSOLUTE): 0.3 10*3/uL (ref 0.0–0.4)
Eos: 3 %
Hematocrit: 28.6 % — ABNORMAL LOW (ref 34.0–46.6)
Hemoglobin: 9.3 g/dL — ABNORMAL LOW (ref 11.1–15.9)
IMMATURE GRANULOCYTES: 0 %
Immature Grans (Abs): 0 10*3/uL (ref 0.0–0.1)
Lymphocytes Absolute: 1.2 10*3/uL (ref 0.7–3.1)
Lymphs: 14 %
MCH: 30.9 pg (ref 26.6–33.0)
MCHC: 32.5 g/dL (ref 31.5–35.7)
MCV: 95 fL (ref 79–97)
MONOS ABS: 0.5 10*3/uL (ref 0.1–0.9)
Monocytes: 6 %
NEUTROS ABS: 6.3 10*3/uL (ref 1.4–7.0)
NEUTROS PCT: 76 %
PLATELETS: 576 10*3/uL — AB (ref 150–450)
RBC: 3.01 x10E6/uL — ABNORMAL LOW (ref 3.77–5.28)
RDW: 12 % — AB (ref 12.3–15.4)
WBC: 8.4 10*3/uL (ref 3.4–10.8)

## 2018-01-20 LAB — HEPATITIS PANEL, ACUTE
HEP B C IGM: NEGATIVE
HEP B S AG: NEGATIVE
Hep A IgM: NEGATIVE
Hep C Virus Ab: <0.1 s/co ratio (ref 0.0–0.9)

## 2018-01-20 NOTE — Telephone Encounter (Signed)
Please call patient, lab evaluation showed abnormal LFT is at her baseline,   She should be able to continue Ocrelizamub as previously scheduled.

## 2018-01-20 NOTE — Telephone Encounter (Signed)
Spoke to [place] at [facility] - she is aware of the patient's labs and plans to continue Ocrevus.  Her next infusion is due in June 2019.  A copy of the patient's have been faxed to her attention.

## 2018-02-02 DIAGNOSIS — L89324 Pressure ulcer of left buttock, stage 4: Secondary | ICD-10-CM | POA: Diagnosis not present

## 2018-02-02 DIAGNOSIS — M6281 Muscle weakness (generalized): Secondary | ICD-10-CM | POA: Diagnosis not present

## 2018-02-02 DIAGNOSIS — F4321 Adjustment disorder with depressed mood: Secondary | ICD-10-CM | POA: Diagnosis not present

## 2018-02-02 DIAGNOSIS — M62838 Other muscle spasm: Secondary | ICD-10-CM | POA: Diagnosis not present

## 2018-02-02 DIAGNOSIS — L89154 Pressure ulcer of sacral region, stage 4: Secondary | ICD-10-CM | POA: Diagnosis not present

## 2018-02-02 DIAGNOSIS — E43 Unspecified severe protein-calorie malnutrition: Secondary | ICD-10-CM | POA: Diagnosis not present

## 2018-02-02 DIAGNOSIS — D508 Other iron deficiency anemias: Secondary | ICD-10-CM | POA: Diagnosis not present

## 2018-02-02 DIAGNOSIS — G35 Multiple sclerosis: Secondary | ICD-10-CM | POA: Diagnosis not present

## 2018-02-06 DIAGNOSIS — F4321 Adjustment disorder with depressed mood: Secondary | ICD-10-CM | POA: Diagnosis not present

## 2018-02-08 DIAGNOSIS — M6281 Muscle weakness (generalized): Secondary | ICD-10-CM | POA: Diagnosis not present

## 2018-02-08 DIAGNOSIS — E43 Unspecified severe protein-calorie malnutrition: Secondary | ICD-10-CM | POA: Diagnosis not present

## 2018-02-08 DIAGNOSIS — L89324 Pressure ulcer of left buttock, stage 4: Secondary | ICD-10-CM | POA: Diagnosis not present

## 2018-02-10 DIAGNOSIS — Z79899 Other long term (current) drug therapy: Secondary | ICD-10-CM | POA: Diagnosis not present

## 2018-02-10 DIAGNOSIS — E039 Hypothyroidism, unspecified: Secondary | ICD-10-CM | POA: Diagnosis not present

## 2018-02-10 DIAGNOSIS — I10 Essential (primary) hypertension: Secondary | ICD-10-CM | POA: Diagnosis not present

## 2018-02-10 DIAGNOSIS — R509 Fever, unspecified: Secondary | ICD-10-CM | POA: Diagnosis not present

## 2018-02-10 DIAGNOSIS — E785 Hyperlipidemia, unspecified: Secondary | ICD-10-CM | POA: Diagnosis not present

## 2018-02-10 DIAGNOSIS — E559 Vitamin D deficiency, unspecified: Secondary | ICD-10-CM | POA: Diagnosis not present

## 2018-02-15 DIAGNOSIS — F4321 Adjustment disorder with depressed mood: Secondary | ICD-10-CM | POA: Diagnosis not present

## 2018-02-15 DIAGNOSIS — G35 Multiple sclerosis: Secondary | ICD-10-CM | POA: Diagnosis not present

## 2018-02-15 DIAGNOSIS — L89324 Pressure ulcer of left buttock, stage 4: Secondary | ICD-10-CM | POA: Diagnosis not present

## 2018-02-15 DIAGNOSIS — M6281 Muscle weakness (generalized): Secondary | ICD-10-CM | POA: Diagnosis not present

## 2018-02-15 DIAGNOSIS — M6249 Contracture of muscle, multiple sites: Secondary | ICD-10-CM | POA: Diagnosis not present

## 2018-02-15 DIAGNOSIS — E43 Unspecified severe protein-calorie malnutrition: Secondary | ICD-10-CM | POA: Diagnosis not present

## 2018-02-16 DIAGNOSIS — M6281 Muscle weakness (generalized): Secondary | ICD-10-CM | POA: Diagnosis not present

## 2018-02-16 DIAGNOSIS — M6249 Contracture of muscle, multiple sites: Secondary | ICD-10-CM | POA: Diagnosis not present

## 2018-02-16 DIAGNOSIS — G35 Multiple sclerosis: Secondary | ICD-10-CM | POA: Diagnosis not present

## 2018-02-19 DIAGNOSIS — G35 Multiple sclerosis: Secondary | ICD-10-CM | POA: Diagnosis not present

## 2018-02-19 DIAGNOSIS — M6281 Muscle weakness (generalized): Secondary | ICD-10-CM | POA: Diagnosis not present

## 2018-02-19 DIAGNOSIS — M6249 Contracture of muscle, multiple sites: Secondary | ICD-10-CM | POA: Diagnosis not present

## 2018-02-20 DIAGNOSIS — M6249 Contracture of muscle, multiple sites: Secondary | ICD-10-CM | POA: Diagnosis not present

## 2018-02-20 DIAGNOSIS — G35 Multiple sclerosis: Secondary | ICD-10-CM | POA: Diagnosis not present

## 2018-02-20 DIAGNOSIS — M6281 Muscle weakness (generalized): Secondary | ICD-10-CM | POA: Diagnosis not present

## 2018-02-21 DIAGNOSIS — M6281 Muscle weakness (generalized): Secondary | ICD-10-CM | POA: Diagnosis not present

## 2018-02-21 DIAGNOSIS — G35 Multiple sclerosis: Secondary | ICD-10-CM | POA: Diagnosis not present

## 2018-02-21 DIAGNOSIS — M6249 Contracture of muscle, multiple sites: Secondary | ICD-10-CM | POA: Diagnosis not present

## 2018-02-22 DIAGNOSIS — E43 Unspecified severe protein-calorie malnutrition: Secondary | ICD-10-CM | POA: Diagnosis not present

## 2018-02-22 DIAGNOSIS — M6249 Contracture of muscle, multiple sites: Secondary | ICD-10-CM | POA: Diagnosis not present

## 2018-02-22 DIAGNOSIS — G35 Multiple sclerosis: Secondary | ICD-10-CM | POA: Diagnosis not present

## 2018-02-22 DIAGNOSIS — F4321 Adjustment disorder with depressed mood: Secondary | ICD-10-CM | POA: Diagnosis not present

## 2018-02-22 DIAGNOSIS — L89324 Pressure ulcer of left buttock, stage 4: Secondary | ICD-10-CM | POA: Diagnosis not present

## 2018-02-22 DIAGNOSIS — M6281 Muscle weakness (generalized): Secondary | ICD-10-CM | POA: Diagnosis not present

## 2018-02-23 DIAGNOSIS — G35 Multiple sclerosis: Secondary | ICD-10-CM | POA: Diagnosis not present

## 2018-02-23 DIAGNOSIS — M6249 Contracture of muscle, multiple sites: Secondary | ICD-10-CM | POA: Diagnosis not present

## 2018-02-23 DIAGNOSIS — M6281 Muscle weakness (generalized): Secondary | ICD-10-CM | POA: Diagnosis not present

## 2018-02-24 DIAGNOSIS — M6281 Muscle weakness (generalized): Secondary | ICD-10-CM | POA: Diagnosis not present

## 2018-02-24 DIAGNOSIS — M6249 Contracture of muscle, multiple sites: Secondary | ICD-10-CM | POA: Diagnosis not present

## 2018-02-24 DIAGNOSIS — G35 Multiple sclerosis: Secondary | ICD-10-CM | POA: Diagnosis not present

## 2018-02-26 DIAGNOSIS — M6281 Muscle weakness (generalized): Secondary | ICD-10-CM | POA: Diagnosis not present

## 2018-02-26 DIAGNOSIS — M6249 Contracture of muscle, multiple sites: Secondary | ICD-10-CM | POA: Diagnosis not present

## 2018-02-26 DIAGNOSIS — G35 Multiple sclerosis: Secondary | ICD-10-CM | POA: Diagnosis not present

## 2018-02-27 DIAGNOSIS — M6249 Contracture of muscle, multiple sites: Secondary | ICD-10-CM | POA: Diagnosis not present

## 2018-02-27 DIAGNOSIS — M6281 Muscle weakness (generalized): Secondary | ICD-10-CM | POA: Diagnosis not present

## 2018-02-27 DIAGNOSIS — G35 Multiple sclerosis: Secondary | ICD-10-CM | POA: Diagnosis not present

## 2018-02-28 DIAGNOSIS — F4321 Adjustment disorder with depressed mood: Secondary | ICD-10-CM | POA: Diagnosis not present

## 2018-03-01 DIAGNOSIS — L89324 Pressure ulcer of left buttock, stage 4: Secondary | ICD-10-CM | POA: Diagnosis not present

## 2018-03-01 DIAGNOSIS — E43 Unspecified severe protein-calorie malnutrition: Secondary | ICD-10-CM | POA: Diagnosis not present

## 2018-03-01 DIAGNOSIS — G35 Multiple sclerosis: Secondary | ICD-10-CM | POA: Diagnosis not present

## 2018-03-01 DIAGNOSIS — M6281 Muscle weakness (generalized): Secondary | ICD-10-CM | POA: Diagnosis not present

## 2018-03-01 DIAGNOSIS — M6249 Contracture of muscle, multiple sites: Secondary | ICD-10-CM | POA: Diagnosis not present

## 2018-03-02 DIAGNOSIS — D508 Other iron deficiency anemias: Secondary | ICD-10-CM | POA: Diagnosis not present

## 2018-03-02 DIAGNOSIS — M6281 Muscle weakness (generalized): Secondary | ICD-10-CM | POA: Diagnosis not present

## 2018-03-02 DIAGNOSIS — M6249 Contracture of muscle, multiple sites: Secondary | ICD-10-CM | POA: Diagnosis not present

## 2018-03-02 DIAGNOSIS — L89154 Pressure ulcer of sacral region, stage 4: Secondary | ICD-10-CM | POA: Diagnosis not present

## 2018-03-02 DIAGNOSIS — M62838 Other muscle spasm: Secondary | ICD-10-CM | POA: Diagnosis not present

## 2018-03-02 DIAGNOSIS — G35 Multiple sclerosis: Secondary | ICD-10-CM | POA: Diagnosis not present

## 2018-03-03 DIAGNOSIS — M6249 Contracture of muscle, multiple sites: Secondary | ICD-10-CM | POA: Diagnosis not present

## 2018-03-03 DIAGNOSIS — G35 Multiple sclerosis: Secondary | ICD-10-CM | POA: Diagnosis not present

## 2018-03-03 DIAGNOSIS — M6281 Muscle weakness (generalized): Secondary | ICD-10-CM | POA: Diagnosis not present

## 2018-03-06 DIAGNOSIS — G35 Multiple sclerosis: Secondary | ICD-10-CM | POA: Diagnosis not present

## 2018-03-06 DIAGNOSIS — M6249 Contracture of muscle, multiple sites: Secondary | ICD-10-CM | POA: Diagnosis not present

## 2018-03-06 DIAGNOSIS — M6281 Muscle weakness (generalized): Secondary | ICD-10-CM | POA: Diagnosis not present

## 2018-03-07 DIAGNOSIS — F4321 Adjustment disorder with depressed mood: Secondary | ICD-10-CM | POA: Diagnosis not present

## 2018-03-07 DIAGNOSIS — M6281 Muscle weakness (generalized): Secondary | ICD-10-CM | POA: Diagnosis not present

## 2018-03-07 DIAGNOSIS — G35 Multiple sclerosis: Secondary | ICD-10-CM | POA: Diagnosis not present

## 2018-03-07 DIAGNOSIS — M6249 Contracture of muscle, multiple sites: Secondary | ICD-10-CM | POA: Diagnosis not present

## 2018-03-08 ENCOUNTER — Non-Acute Institutional Stay: Payer: Medicare Other | Admitting: Internal Medicine

## 2018-03-08 DIAGNOSIS — Z515 Encounter for palliative care: Secondary | ICD-10-CM

## 2018-03-08 DIAGNOSIS — G35 Multiple sclerosis: Secondary | ICD-10-CM | POA: Diagnosis not present

## 2018-03-08 DIAGNOSIS — M6281 Muscle weakness (generalized): Secondary | ICD-10-CM | POA: Diagnosis not present

## 2018-03-08 DIAGNOSIS — L89324 Pressure ulcer of left buttock, stage 4: Secondary | ICD-10-CM | POA: Diagnosis not present

## 2018-03-08 DIAGNOSIS — M6249 Contracture of muscle, multiple sites: Secondary | ICD-10-CM | POA: Diagnosis not present

## 2018-03-08 DIAGNOSIS — Z7189 Other specified counseling: Secondary | ICD-10-CM | POA: Insufficient documentation

## 2018-03-08 DIAGNOSIS — E43 Unspecified severe protein-calorie malnutrition: Secondary | ICD-10-CM | POA: Diagnosis not present

## 2018-03-08 NOTE — Progress Notes (Signed)
[facility] Telephone: [phone] Fax: [phone]  PATIENT NAME: Deanna Baldwin DOB: 03-21-1975 MRN: [id number]  PRIMARY CARE PROVIDER:   [name], MD  REFERRING PROVIDER:  [name], MD [place], [place] [address]  RESPONSIBLE PARTY:   Self    RECOMMENDATIONS and PLAN:  Palliative care encounter Z51.5  1. Advanced Care Planning: Joint care plan and family meeting with patient, sister, PT, nursing and SW.  Goals of care are for increase nutritional intake and mobility OOB.  Increase ROM after Botox injections.  Continue wound therapy per wound nurse. Re-evaluate potential for transfer to lower level housing after determining pt transfer/wt baring ability in March.  Recommend 24 hour assistance at any setting.  Code status and full scope of treatment discussed in presence of sister.  No desire for tube feedings at this time.   I spent 40 minutes providing this consultation,  from 1:30pm to 2:10pm at [facility] More than 50% of the time in this consultation was spent coordinating communication.   HISTORY OF PRESENT ILLNESS:  Follow-up with  [name] during care plan meeting. No complaints from patient.  PT reports improvement in getting OOB at least 2x/week.  Good appetite with weight gain to 91#.     CODE STATUS: FULL CODE  PPS: 40% HOSPICE ELIGIBILITY/DIAGNOSIS: TBD  PAST MEDICAL HISTORY:  Past Medical History:  Diagnosis Date  . Buttock wound 03/03/2016  . Dysrhythmia    tachycardia  . MS (multiple sclerosis) ([place])   . Neutropenia ([place])   . Pneumonia 02/2016  . Protein calorie malnutrition ([place])   . Sacral osteomyelitis ([place]) 05/19/2017  . Severe sepsis ([place]) 03/03/2016  . 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 UTI (urinary tract infection) 02/2016     ALLERGIES:  Allergies  Allergen Reactions  . Natalizumab       PHYSICAL EXAM:   General: NAD, frail appearing, cachectic,  thin Pulmonary: unlabored respirations Extremities: Decreased muscle tone. BLE contractures.  Full ROM BUE. Skin: Reports of buttock wound but not seen Neurological: A&O x3.  Intermittent speech hesitance but clear in delivery.   Gonzella Lex, NP

## 2018-03-09 DIAGNOSIS — M6281 Muscle weakness (generalized): Secondary | ICD-10-CM | POA: Diagnosis not present

## 2018-03-09 DIAGNOSIS — G35 Multiple sclerosis: Secondary | ICD-10-CM | POA: Diagnosis not present

## 2018-03-09 DIAGNOSIS — M6249 Contracture of muscle, multiple sites: Secondary | ICD-10-CM | POA: Diagnosis not present

## 2018-03-10 DIAGNOSIS — G35 Multiple sclerosis: Secondary | ICD-10-CM | POA: Diagnosis not present

## 2018-03-10 DIAGNOSIS — M6249 Contracture of muscle, multiple sites: Secondary | ICD-10-CM | POA: Diagnosis not present

## 2018-03-10 DIAGNOSIS — M6281 Muscle weakness (generalized): Secondary | ICD-10-CM | POA: Diagnosis not present

## 2018-03-13 DIAGNOSIS — G35 Multiple sclerosis: Secondary | ICD-10-CM | POA: Diagnosis not present

## 2018-03-13 DIAGNOSIS — M6249 Contracture of muscle, multiple sites: Secondary | ICD-10-CM | POA: Diagnosis not present

## 2018-03-13 DIAGNOSIS — M6281 Muscle weakness (generalized): Secondary | ICD-10-CM | POA: Diagnosis not present

## 2018-03-14 DIAGNOSIS — G35 Multiple sclerosis: Secondary | ICD-10-CM | POA: Diagnosis not present

## 2018-03-14 DIAGNOSIS — M6249 Contracture of muscle, multiple sites: Secondary | ICD-10-CM | POA: Diagnosis not present

## 2018-03-14 DIAGNOSIS — M6281 Muscle weakness (generalized): Secondary | ICD-10-CM | POA: Diagnosis not present

## 2018-03-15 DIAGNOSIS — M6249 Contracture of muscle, multiple sites: Secondary | ICD-10-CM | POA: Diagnosis not present

## 2018-03-15 DIAGNOSIS — F4321 Adjustment disorder with depressed mood: Secondary | ICD-10-CM | POA: Diagnosis not present

## 2018-03-15 DIAGNOSIS — E43 Unspecified severe protein-calorie malnutrition: Secondary | ICD-10-CM | POA: Diagnosis not present

## 2018-03-15 DIAGNOSIS — M6281 Muscle weakness (generalized): Secondary | ICD-10-CM | POA: Diagnosis not present

## 2018-03-15 DIAGNOSIS — G35 Multiple sclerosis: Secondary | ICD-10-CM | POA: Diagnosis not present

## 2018-03-15 DIAGNOSIS — L89324 Pressure ulcer of left buttock, stage 4: Secondary | ICD-10-CM | POA: Diagnosis not present

## 2018-03-16 DIAGNOSIS — G35 Multiple sclerosis: Secondary | ICD-10-CM | POA: Diagnosis not present

## 2018-03-16 DIAGNOSIS — M6249 Contracture of muscle, multiple sites: Secondary | ICD-10-CM | POA: Diagnosis not present

## 2018-03-16 DIAGNOSIS — M6281 Muscle weakness (generalized): Secondary | ICD-10-CM | POA: Diagnosis not present

## 2018-03-17 DIAGNOSIS — G35 Multiple sclerosis: Secondary | ICD-10-CM | POA: Diagnosis not present

## 2018-03-17 DIAGNOSIS — M6281 Muscle weakness (generalized): Secondary | ICD-10-CM | POA: Diagnosis not present

## 2018-03-17 DIAGNOSIS — M6249 Contracture of muscle, multiple sites: Secondary | ICD-10-CM | POA: Diagnosis not present

## 2018-03-20 DIAGNOSIS — M6281 Muscle weakness (generalized): Secondary | ICD-10-CM | POA: Diagnosis not present

## 2018-03-20 DIAGNOSIS — M6249 Contracture of muscle, multiple sites: Secondary | ICD-10-CM | POA: Diagnosis not present

## 2018-03-20 DIAGNOSIS — G35 Multiple sclerosis: Secondary | ICD-10-CM | POA: Diagnosis not present

## 2018-03-21 DIAGNOSIS — F4321 Adjustment disorder with depressed mood: Secondary | ICD-10-CM | POA: Diagnosis not present

## 2018-03-21 DIAGNOSIS — G35 Multiple sclerosis: Secondary | ICD-10-CM | POA: Diagnosis not present

## 2018-03-21 DIAGNOSIS — M6249 Contracture of muscle, multiple sites: Secondary | ICD-10-CM | POA: Diagnosis not present

## 2018-03-21 DIAGNOSIS — M6281 Muscle weakness (generalized): Secondary | ICD-10-CM | POA: Diagnosis not present

## 2018-03-22 DIAGNOSIS — G35 Multiple sclerosis: Secondary | ICD-10-CM | POA: Diagnosis not present

## 2018-03-22 DIAGNOSIS — L89324 Pressure ulcer of left buttock, stage 4: Secondary | ICD-10-CM | POA: Diagnosis not present

## 2018-03-22 DIAGNOSIS — E43 Unspecified severe protein-calorie malnutrition: Secondary | ICD-10-CM | POA: Diagnosis not present

## 2018-03-22 DIAGNOSIS — M6281 Muscle weakness (generalized): Secondary | ICD-10-CM | POA: Diagnosis not present

## 2018-03-22 DIAGNOSIS — M6249 Contracture of muscle, multiple sites: Secondary | ICD-10-CM | POA: Diagnosis not present

## 2018-03-23 DIAGNOSIS — M6281 Muscle weakness (generalized): Secondary | ICD-10-CM | POA: Diagnosis not present

## 2018-03-23 DIAGNOSIS — G35 Multiple sclerosis: Secondary | ICD-10-CM | POA: Diagnosis not present

## 2018-03-23 DIAGNOSIS — M6249 Contracture of muscle, multiple sites: Secondary | ICD-10-CM | POA: Diagnosis not present

## 2018-03-26 DIAGNOSIS — M6249 Contracture of muscle, multiple sites: Secondary | ICD-10-CM | POA: Diagnosis not present

## 2018-03-26 DIAGNOSIS — G35 Multiple sclerosis: Secondary | ICD-10-CM | POA: Diagnosis not present

## 2018-03-26 DIAGNOSIS — M6281 Muscle weakness (generalized): Secondary | ICD-10-CM | POA: Diagnosis not present

## 2018-03-27 DIAGNOSIS — M6249 Contracture of muscle, multiple sites: Secondary | ICD-10-CM | POA: Diagnosis not present

## 2018-03-27 DIAGNOSIS — G35 Multiple sclerosis: Secondary | ICD-10-CM | POA: Diagnosis not present

## 2018-03-27 DIAGNOSIS — F4321 Adjustment disorder with depressed mood: Secondary | ICD-10-CM | POA: Diagnosis not present

## 2018-03-27 DIAGNOSIS — M6281 Muscle weakness (generalized): Secondary | ICD-10-CM | POA: Diagnosis not present

## 2018-03-28 DIAGNOSIS — M6281 Muscle weakness (generalized): Secondary | ICD-10-CM | POA: Diagnosis not present

## 2018-03-28 DIAGNOSIS — M6249 Contracture of muscle, multiple sites: Secondary | ICD-10-CM | POA: Diagnosis not present

## 2018-03-28 DIAGNOSIS — G35 Multiple sclerosis: Secondary | ICD-10-CM | POA: Diagnosis not present

## 2018-03-29 DIAGNOSIS — L89324 Pressure ulcer of left buttock, stage 4: Secondary | ICD-10-CM | POA: Diagnosis not present

## 2018-03-29 DIAGNOSIS — M6281 Muscle weakness (generalized): Secondary | ICD-10-CM | POA: Diagnosis not present

## 2018-03-29 DIAGNOSIS — G35 Multiple sclerosis: Secondary | ICD-10-CM | POA: Diagnosis not present

## 2018-03-29 DIAGNOSIS — E43 Unspecified severe protein-calorie malnutrition: Secondary | ICD-10-CM | POA: Diagnosis not present

## 2018-03-29 DIAGNOSIS — L89314 Pressure ulcer of right buttock, stage 4: Secondary | ICD-10-CM | POA: Diagnosis not present

## 2018-03-29 DIAGNOSIS — M6249 Contracture of muscle, multiple sites: Secondary | ICD-10-CM | POA: Diagnosis not present

## 2018-04-01 DIAGNOSIS — D508 Other iron deficiency anemias: Secondary | ICD-10-CM | POA: Diagnosis not present

## 2018-04-01 DIAGNOSIS — L89154 Pressure ulcer of sacral region, stage 4: Secondary | ICD-10-CM | POA: Diagnosis not present

## 2018-04-01 DIAGNOSIS — M62838 Other muscle spasm: Secondary | ICD-10-CM | POA: Diagnosis not present

## 2018-04-01 DIAGNOSIS — G35 Multiple sclerosis: Secondary | ICD-10-CM | POA: Diagnosis not present

## 2018-04-02 DIAGNOSIS — G35 Multiple sclerosis: Secondary | ICD-10-CM | POA: Diagnosis not present

## 2018-04-02 DIAGNOSIS — M6281 Muscle weakness (generalized): Secondary | ICD-10-CM | POA: Diagnosis not present

## 2018-04-02 DIAGNOSIS — M6249 Contracture of muscle, multiple sites: Secondary | ICD-10-CM | POA: Diagnosis not present

## 2018-04-03 DIAGNOSIS — M6249 Contracture of muscle, multiple sites: Secondary | ICD-10-CM | POA: Diagnosis not present

## 2018-04-03 DIAGNOSIS — G35 Multiple sclerosis: Secondary | ICD-10-CM | POA: Diagnosis not present

## 2018-04-03 DIAGNOSIS — M6281 Muscle weakness (generalized): Secondary | ICD-10-CM | POA: Diagnosis not present

## 2018-04-04 DIAGNOSIS — M6249 Contracture of muscle, multiple sites: Secondary | ICD-10-CM | POA: Diagnosis not present

## 2018-04-04 DIAGNOSIS — G35 Multiple sclerosis: Secondary | ICD-10-CM | POA: Diagnosis not present

## 2018-04-04 DIAGNOSIS — M6281 Muscle weakness (generalized): Secondary | ICD-10-CM | POA: Diagnosis not present

## 2018-04-05 DIAGNOSIS — M6281 Muscle weakness (generalized): Secondary | ICD-10-CM | POA: Diagnosis not present

## 2018-04-05 DIAGNOSIS — G35 Multiple sclerosis: Secondary | ICD-10-CM | POA: Diagnosis not present

## 2018-04-05 DIAGNOSIS — E43 Unspecified severe protein-calorie malnutrition: Secondary | ICD-10-CM | POA: Diagnosis not present

## 2018-04-05 DIAGNOSIS — M6249 Contracture of muscle, multiple sites: Secondary | ICD-10-CM | POA: Diagnosis not present

## 2018-04-05 DIAGNOSIS — L89324 Pressure ulcer of left buttock, stage 4: Secondary | ICD-10-CM | POA: Diagnosis not present

## 2018-04-05 DIAGNOSIS — L89314 Pressure ulcer of right buttock, stage 4: Secondary | ICD-10-CM | POA: Diagnosis not present

## 2018-04-06 DIAGNOSIS — D473 Essential (hemorrhagic) thrombocythemia: Secondary | ICD-10-CM | POA: Diagnosis not present

## 2018-04-06 DIAGNOSIS — D649 Anemia, unspecified: Secondary | ICD-10-CM | POA: Diagnosis not present

## 2018-04-07 DIAGNOSIS — M6281 Muscle weakness (generalized): Secondary | ICD-10-CM | POA: Diagnosis not present

## 2018-04-07 DIAGNOSIS — G35 Multiple sclerosis: Secondary | ICD-10-CM | POA: Diagnosis not present

## 2018-04-07 DIAGNOSIS — M6249 Contracture of muscle, multiple sites: Secondary | ICD-10-CM | POA: Diagnosis not present

## 2018-04-07 DIAGNOSIS — F4321 Adjustment disorder with depressed mood: Secondary | ICD-10-CM | POA: Diagnosis not present

## 2018-04-09 DIAGNOSIS — M6249 Contracture of muscle, multiple sites: Secondary | ICD-10-CM | POA: Diagnosis not present

## 2018-04-09 DIAGNOSIS — M6281 Muscle weakness (generalized): Secondary | ICD-10-CM | POA: Diagnosis not present

## 2018-04-09 DIAGNOSIS — G35 Multiple sclerosis: Secondary | ICD-10-CM | POA: Diagnosis not present

## 2018-04-10 DIAGNOSIS — M6249 Contracture of muscle, multiple sites: Secondary | ICD-10-CM | POA: Diagnosis not present

## 2018-04-10 DIAGNOSIS — M6281 Muscle weakness (generalized): Secondary | ICD-10-CM | POA: Diagnosis not present

## 2018-04-10 DIAGNOSIS — G35 Multiple sclerosis: Secondary | ICD-10-CM | POA: Diagnosis not present

## 2018-04-11 DIAGNOSIS — M6249 Contracture of muscle, multiple sites: Secondary | ICD-10-CM | POA: Diagnosis not present

## 2018-04-11 DIAGNOSIS — F4321 Adjustment disorder with depressed mood: Secondary | ICD-10-CM | POA: Diagnosis not present

## 2018-04-11 DIAGNOSIS — G35 Multiple sclerosis: Secondary | ICD-10-CM | POA: Diagnosis not present

## 2018-04-11 DIAGNOSIS — M6281 Muscle weakness (generalized): Secondary | ICD-10-CM | POA: Diagnosis not present

## 2018-04-12 DIAGNOSIS — M6281 Muscle weakness (generalized): Secondary | ICD-10-CM | POA: Diagnosis not present

## 2018-04-12 DIAGNOSIS — L89324 Pressure ulcer of left buttock, stage 4: Secondary | ICD-10-CM | POA: Diagnosis not present

## 2018-04-12 DIAGNOSIS — M6249 Contracture of muscle, multiple sites: Secondary | ICD-10-CM | POA: Diagnosis not present

## 2018-04-12 DIAGNOSIS — L89314 Pressure ulcer of right buttock, stage 4: Secondary | ICD-10-CM | POA: Diagnosis not present

## 2018-04-12 DIAGNOSIS — E43 Unspecified severe protein-calorie malnutrition: Secondary | ICD-10-CM | POA: Diagnosis not present

## 2018-04-12 DIAGNOSIS — G35 Multiple sclerosis: Secondary | ICD-10-CM | POA: Diagnosis not present

## 2018-04-14 DIAGNOSIS — G35 Multiple sclerosis: Secondary | ICD-10-CM | POA: Diagnosis not present

## 2018-04-14 DIAGNOSIS — M6249 Contracture of muscle, multiple sites: Secondary | ICD-10-CM | POA: Diagnosis not present

## 2018-04-14 DIAGNOSIS — M6281 Muscle weakness (generalized): Secondary | ICD-10-CM | POA: Diagnosis not present

## 2018-04-16 DIAGNOSIS — M6281 Muscle weakness (generalized): Secondary | ICD-10-CM | POA: Diagnosis not present

## 2018-04-16 DIAGNOSIS — M6249 Contracture of muscle, multiple sites: Secondary | ICD-10-CM | POA: Diagnosis not present

## 2018-04-16 DIAGNOSIS — G35 Multiple sclerosis: Secondary | ICD-10-CM | POA: Diagnosis not present

## 2018-04-17 DIAGNOSIS — G35 Multiple sclerosis: Secondary | ICD-10-CM | POA: Diagnosis not present

## 2018-04-17 DIAGNOSIS — M6281 Muscle weakness (generalized): Secondary | ICD-10-CM | POA: Diagnosis not present

## 2018-04-17 DIAGNOSIS — M6249 Contracture of muscle, multiple sites: Secondary | ICD-10-CM | POA: Diagnosis not present

## 2018-04-18 DIAGNOSIS — M6249 Contracture of muscle, multiple sites: Secondary | ICD-10-CM | POA: Diagnosis not present

## 2018-04-18 DIAGNOSIS — M6281 Muscle weakness (generalized): Secondary | ICD-10-CM | POA: Diagnosis not present

## 2018-04-18 DIAGNOSIS — G35 Multiple sclerosis: Secondary | ICD-10-CM | POA: Diagnosis not present

## 2018-04-19 DIAGNOSIS — M6249 Contracture of muscle, multiple sites: Secondary | ICD-10-CM | POA: Diagnosis not present

## 2018-04-19 DIAGNOSIS — L89314 Pressure ulcer of right buttock, stage 4: Secondary | ICD-10-CM | POA: Diagnosis not present

## 2018-04-19 DIAGNOSIS — E43 Unspecified severe protein-calorie malnutrition: Secondary | ICD-10-CM | POA: Diagnosis not present

## 2018-04-19 DIAGNOSIS — M6281 Muscle weakness (generalized): Secondary | ICD-10-CM | POA: Diagnosis not present

## 2018-04-19 DIAGNOSIS — L89324 Pressure ulcer of left buttock, stage 4: Secondary | ICD-10-CM | POA: Diagnosis not present

## 2018-04-19 DIAGNOSIS — G35 Multiple sclerosis: Secondary | ICD-10-CM | POA: Diagnosis not present

## 2018-04-20 ENCOUNTER — Ambulatory Visit: Payer: Self-pay | Admitting: Neurology

## 2018-04-20 DIAGNOSIS — G35 Multiple sclerosis: Secondary | ICD-10-CM | POA: Diagnosis not present

## 2018-04-20 DIAGNOSIS — M6249 Contracture of muscle, multiple sites: Secondary | ICD-10-CM | POA: Diagnosis not present

## 2018-04-20 DIAGNOSIS — M6281 Muscle weakness (generalized): Secondary | ICD-10-CM | POA: Diagnosis not present

## 2018-04-21 DIAGNOSIS — M6281 Muscle weakness (generalized): Secondary | ICD-10-CM | POA: Diagnosis not present

## 2018-04-21 DIAGNOSIS — G35 Multiple sclerosis: Secondary | ICD-10-CM | POA: Diagnosis not present

## 2018-04-21 DIAGNOSIS — M6249 Contracture of muscle, multiple sites: Secondary | ICD-10-CM | POA: Diagnosis not present

## 2018-04-22 DIAGNOSIS — F4321 Adjustment disorder with depressed mood: Secondary | ICD-10-CM | POA: Diagnosis not present

## 2018-04-23 DIAGNOSIS — M6249 Contracture of muscle, multiple sites: Secondary | ICD-10-CM | POA: Diagnosis not present

## 2018-04-23 DIAGNOSIS — G35 Multiple sclerosis: Secondary | ICD-10-CM | POA: Diagnosis not present

## 2018-04-23 DIAGNOSIS — M6281 Muscle weakness (generalized): Secondary | ICD-10-CM | POA: Diagnosis not present

## 2018-04-24 DIAGNOSIS — M6281 Muscle weakness (generalized): Secondary | ICD-10-CM | POA: Diagnosis not present

## 2018-04-24 DIAGNOSIS — G35 Multiple sclerosis: Secondary | ICD-10-CM | POA: Diagnosis not present

## 2018-04-24 DIAGNOSIS — M6249 Contracture of muscle, multiple sites: Secondary | ICD-10-CM | POA: Diagnosis not present

## 2018-04-25 DIAGNOSIS — E43 Unspecified severe protein-calorie malnutrition: Secondary | ICD-10-CM | POA: Diagnosis not present

## 2018-04-25 DIAGNOSIS — L89314 Pressure ulcer of right buttock, stage 4: Secondary | ICD-10-CM | POA: Diagnosis not present

## 2018-04-25 DIAGNOSIS — L89324 Pressure ulcer of left buttock, stage 4: Secondary | ICD-10-CM | POA: Diagnosis not present

## 2018-04-25 DIAGNOSIS — M6281 Muscle weakness (generalized): Secondary | ICD-10-CM | POA: Diagnosis not present

## 2018-04-26 DIAGNOSIS — M6281 Muscle weakness (generalized): Secondary | ICD-10-CM | POA: Diagnosis not present

## 2018-04-26 DIAGNOSIS — G35 Multiple sclerosis: Secondary | ICD-10-CM | POA: Diagnosis not present

## 2018-04-26 DIAGNOSIS — M6249 Contracture of muscle, multiple sites: Secondary | ICD-10-CM | POA: Diagnosis not present

## 2018-04-27 DIAGNOSIS — G35 Multiple sclerosis: Secondary | ICD-10-CM | POA: Diagnosis not present

## 2018-04-27 DIAGNOSIS — M6249 Contracture of muscle, multiple sites: Secondary | ICD-10-CM | POA: Diagnosis not present

## 2018-04-27 DIAGNOSIS — L89154 Pressure ulcer of sacral region, stage 4: Secondary | ICD-10-CM | POA: Diagnosis not present

## 2018-04-27 DIAGNOSIS — M6281 Muscle weakness (generalized): Secondary | ICD-10-CM | POA: Diagnosis not present

## 2018-04-27 DIAGNOSIS — D500 Iron deficiency anemia secondary to blood loss (chronic): Secondary | ICD-10-CM | POA: Diagnosis not present

## 2018-04-27 DIAGNOSIS — M62838 Other muscle spasm: Secondary | ICD-10-CM | POA: Diagnosis not present

## 2018-04-28 DIAGNOSIS — G35 Multiple sclerosis: Secondary | ICD-10-CM | POA: Diagnosis not present

## 2018-04-28 DIAGNOSIS — M6281 Muscle weakness (generalized): Secondary | ICD-10-CM | POA: Diagnosis not present

## 2018-04-28 DIAGNOSIS — M6249 Contracture of muscle, multiple sites: Secondary | ICD-10-CM | POA: Diagnosis not present

## 2018-04-28 DIAGNOSIS — F4321 Adjustment disorder with depressed mood: Secondary | ICD-10-CM | POA: Diagnosis not present

## 2018-05-01 DIAGNOSIS — M6249 Contracture of muscle, multiple sites: Secondary | ICD-10-CM | POA: Diagnosis not present

## 2018-05-01 DIAGNOSIS — M6281 Muscle weakness (generalized): Secondary | ICD-10-CM | POA: Diagnosis not present

## 2018-05-01 DIAGNOSIS — G35 Multiple sclerosis: Secondary | ICD-10-CM | POA: Diagnosis not present

## 2018-05-02 DIAGNOSIS — M6249 Contracture of muscle, multiple sites: Secondary | ICD-10-CM | POA: Diagnosis not present

## 2018-05-02 DIAGNOSIS — M6281 Muscle weakness (generalized): Secondary | ICD-10-CM | POA: Diagnosis not present

## 2018-05-02 DIAGNOSIS — G35 Multiple sclerosis: Secondary | ICD-10-CM | POA: Diagnosis not present

## 2018-05-03 ENCOUNTER — Other Ambulatory Visit: Payer: Self-pay

## 2018-05-03 ENCOUNTER — Non-Acute Institutional Stay: Payer: Medicare Other | Admitting: Internal Medicine

## 2018-05-03 DIAGNOSIS — D508 Other iron deficiency anemias: Secondary | ICD-10-CM | POA: Diagnosis not present

## 2018-05-03 DIAGNOSIS — E038 Other specified hypothyroidism: Secondary | ICD-10-CM | POA: Diagnosis not present

## 2018-05-03 DIAGNOSIS — G35 Multiple sclerosis: Secondary | ICD-10-CM | POA: Diagnosis not present

## 2018-05-03 DIAGNOSIS — E43 Unspecified severe protein-calorie malnutrition: Secondary | ICD-10-CM | POA: Diagnosis not present

## 2018-05-04 ENCOUNTER — Other Ambulatory Visit: Payer: Self-pay

## 2018-05-04 DIAGNOSIS — L89324 Pressure ulcer of left buttock, stage 4: Secondary | ICD-10-CM | POA: Diagnosis not present

## 2018-05-04 DIAGNOSIS — E039 Hypothyroidism, unspecified: Secondary | ICD-10-CM | POA: Diagnosis not present

## 2018-05-04 DIAGNOSIS — M6281 Muscle weakness (generalized): Secondary | ICD-10-CM | POA: Diagnosis not present

## 2018-05-04 DIAGNOSIS — E559 Vitamin D deficiency, unspecified: Secondary | ICD-10-CM | POA: Diagnosis not present

## 2018-05-04 DIAGNOSIS — L89314 Pressure ulcer of right buttock, stage 4: Secondary | ICD-10-CM | POA: Diagnosis not present

## 2018-05-04 DIAGNOSIS — F4321 Adjustment disorder with depressed mood: Secondary | ICD-10-CM | POA: Diagnosis not present

## 2018-05-04 DIAGNOSIS — E43 Unspecified severe protein-calorie malnutrition: Secondary | ICD-10-CM | POA: Diagnosis not present

## 2018-05-04 DIAGNOSIS — Z79899 Other long term (current) drug therapy: Secondary | ICD-10-CM | POA: Diagnosis not present

## 2018-05-04 DIAGNOSIS — D649 Anemia, unspecified: Secondary | ICD-10-CM | POA: Diagnosis not present

## 2018-05-04 DIAGNOSIS — E119 Type 2 diabetes mellitus without complications: Secondary | ICD-10-CM | POA: Diagnosis not present

## 2018-05-04 DIAGNOSIS — E038 Other specified hypothyroidism: Secondary | ICD-10-CM | POA: Diagnosis not present

## 2018-05-04 DIAGNOSIS — E785 Hyperlipidemia, unspecified: Secondary | ICD-10-CM | POA: Diagnosis not present

## 2018-05-08 DIAGNOSIS — M6281 Muscle weakness (generalized): Secondary | ICD-10-CM | POA: Diagnosis not present

## 2018-05-08 DIAGNOSIS — G35 Multiple sclerosis: Secondary | ICD-10-CM | POA: Diagnosis not present

## 2018-05-08 DIAGNOSIS — M6249 Contracture of muscle, multiple sites: Secondary | ICD-10-CM | POA: Diagnosis not present

## 2018-05-09 DIAGNOSIS — G35 Multiple sclerosis: Secondary | ICD-10-CM | POA: Diagnosis not present

## 2018-05-09 DIAGNOSIS — M6249 Contracture of muscle, multiple sites: Secondary | ICD-10-CM | POA: Diagnosis not present

## 2018-05-09 DIAGNOSIS — M6281 Muscle weakness (generalized): Secondary | ICD-10-CM | POA: Diagnosis not present

## 2018-05-10 DIAGNOSIS — G35 Multiple sclerosis: Secondary | ICD-10-CM | POA: Diagnosis not present

## 2018-05-10 DIAGNOSIS — E43 Unspecified severe protein-calorie malnutrition: Secondary | ICD-10-CM | POA: Diagnosis not present

## 2018-05-10 DIAGNOSIS — L89324 Pressure ulcer of left buttock, stage 4: Secondary | ICD-10-CM | POA: Diagnosis not present

## 2018-05-10 DIAGNOSIS — M6249 Contracture of muscle, multiple sites: Secondary | ICD-10-CM | POA: Diagnosis not present

## 2018-05-10 DIAGNOSIS — L89314 Pressure ulcer of right buttock, stage 4: Secondary | ICD-10-CM | POA: Diagnosis not present

## 2018-05-10 DIAGNOSIS — M6281 Muscle weakness (generalized): Secondary | ICD-10-CM | POA: Diagnosis not present

## 2018-05-11 DIAGNOSIS — M6281 Muscle weakness (generalized): Secondary | ICD-10-CM | POA: Diagnosis not present

## 2018-05-11 DIAGNOSIS — G35 Multiple sclerosis: Secondary | ICD-10-CM | POA: Diagnosis not present

## 2018-05-11 DIAGNOSIS — M6249 Contracture of muscle, multiple sites: Secondary | ICD-10-CM | POA: Diagnosis not present

## 2018-05-12 DIAGNOSIS — F4321 Adjustment disorder with depressed mood: Secondary | ICD-10-CM | POA: Diagnosis not present

## 2018-05-12 DIAGNOSIS — M6249 Contracture of muscle, multiple sites: Secondary | ICD-10-CM | POA: Diagnosis not present

## 2018-05-12 DIAGNOSIS — M6281 Muscle weakness (generalized): Secondary | ICD-10-CM | POA: Diagnosis not present

## 2018-05-12 DIAGNOSIS — G35 Multiple sclerosis: Secondary | ICD-10-CM | POA: Diagnosis not present

## 2018-05-17 DIAGNOSIS — L89314 Pressure ulcer of right buttock, stage 4: Secondary | ICD-10-CM | POA: Diagnosis not present

## 2018-05-17 DIAGNOSIS — M6281 Muscle weakness (generalized): Secondary | ICD-10-CM | POA: Diagnosis not present

## 2018-05-17 DIAGNOSIS — L89324 Pressure ulcer of left buttock, stage 4: Secondary | ICD-10-CM | POA: Diagnosis not present

## 2018-05-17 DIAGNOSIS — E43 Unspecified severe protein-calorie malnutrition: Secondary | ICD-10-CM | POA: Diagnosis not present

## 2018-05-18 DIAGNOSIS — F4321 Adjustment disorder with depressed mood: Secondary | ICD-10-CM | POA: Diagnosis not present

## 2018-05-24 DIAGNOSIS — L89314 Pressure ulcer of right buttock, stage 4: Secondary | ICD-10-CM | POA: Diagnosis not present

## 2018-05-24 DIAGNOSIS — M6281 Muscle weakness (generalized): Secondary | ICD-10-CM | POA: Diagnosis not present

## 2018-05-24 DIAGNOSIS — E43 Unspecified severe protein-calorie malnutrition: Secondary | ICD-10-CM | POA: Diagnosis not present

## 2018-05-24 DIAGNOSIS — L89324 Pressure ulcer of left buttock, stage 4: Secondary | ICD-10-CM | POA: Diagnosis not present

## 2018-05-25 DIAGNOSIS — D508 Other iron deficiency anemias: Secondary | ICD-10-CM | POA: Diagnosis not present

## 2018-05-25 DIAGNOSIS — G35 Multiple sclerosis: Secondary | ICD-10-CM | POA: Diagnosis not present

## 2018-05-25 DIAGNOSIS — E43 Unspecified severe protein-calorie malnutrition: Secondary | ICD-10-CM | POA: Diagnosis not present

## 2018-05-25 DIAGNOSIS — E038 Other specified hypothyroidism: Secondary | ICD-10-CM | POA: Diagnosis not present

## 2018-05-27 DIAGNOSIS — F4321 Adjustment disorder with depressed mood: Secondary | ICD-10-CM | POA: Diagnosis not present

## 2018-05-31 DIAGNOSIS — L89314 Pressure ulcer of right buttock, stage 4: Secondary | ICD-10-CM | POA: Diagnosis not present

## 2018-05-31 DIAGNOSIS — E43 Unspecified severe protein-calorie malnutrition: Secondary | ICD-10-CM | POA: Diagnosis not present

## 2018-05-31 DIAGNOSIS — L89324 Pressure ulcer of left buttock, stage 4: Secondary | ICD-10-CM | POA: Diagnosis not present

## 2018-05-31 DIAGNOSIS — M6281 Muscle weakness (generalized): Secondary | ICD-10-CM | POA: Diagnosis not present

## 2018-06-01 DIAGNOSIS — F4321 Adjustment disorder with depressed mood: Secondary | ICD-10-CM | POA: Diagnosis not present

## 2018-06-02 DIAGNOSIS — G35 Multiple sclerosis: Secondary | ICD-10-CM | POA: Diagnosis not present

## 2018-06-02 DIAGNOSIS — E038 Other specified hypothyroidism: Secondary | ICD-10-CM | POA: Diagnosis not present

## 2018-06-02 DIAGNOSIS — E43 Unspecified severe protein-calorie malnutrition: Secondary | ICD-10-CM | POA: Diagnosis not present

## 2018-06-02 DIAGNOSIS — D508 Other iron deficiency anemias: Secondary | ICD-10-CM | POA: Diagnosis not present

## 2018-06-05 ENCOUNTER — Telehealth: Payer: Self-pay | Admitting: *Deleted

## 2018-06-05 DIAGNOSIS — G35 Multiple sclerosis: Secondary | ICD-10-CM

## 2018-06-05 NOTE — Telephone Encounter (Addendum)
1) The Ocrevus has not been scheduled yet.  Her next appt was pending labs (previous abnormal liver function).  Which labs would you like to order?   2) Would you like to continue the Xeomin injections?  The patient felt there was some benefit based on the message below.

## 2018-06-05 NOTE — Addendum Note (Signed)
Addended by: Marcial Pacas on: 06/05/2018 03:53 PM   Modules accepted: Orders

## 2018-06-05 NOTE — Telephone Encounter (Addendum)
I spoke to Elmyra Ricks who is on the patient's care team at [facility] [phone]).  1)  They are able to draw labs in-house and can fax the results to our office.  They will need to know which labs Dr. Krista Blue would like to order.  They will need the orders faxed to [phone].  2)  Elmyra Ricks spoke with the patient about the Xeomin injections.  The patient feels the injections are beneficial and she would like to continue them.  She has an appt on 07/05/2018.

## 2018-06-05 NOTE — Telephone Encounter (Signed)
Last injection was in Dec 2019,   She has fixed contraction of bilateral lower extremity.  I doubt repeat xeomin injection would help her.  If she on schedule for ocrelizumab infusion?  Spastic paraplegia             Electrical stimulation guided xeomin injection, Initial injection was on March 09, 2017, repeat injection on July 28, 2017, Sept 25 2019 and today January 19, 2018,             She has fixed contraction of bilateral lower extremity, have asked patient and her staff to evaluate the benefit, if there is no significant change in her pain, will not continue injection                          Right adductor longus 100 units             Right adductor magnus 100 units             Right semitendinosus 100 units              Left adductor longus 100 units             Left semitendinosus 50 units             Left adductor magnus 50 units

## 2018-06-05 NOTE — Telephone Encounter (Signed)
Marcial Pacas, MD 27 minutes ago (3:51 PM)      I ordered CMP and liver, CBC w/diff/platelets  If it is normal, we can resume Ocrevus.          I spoke to [place] at [facility] [phone]) earlier today.   She stated they can draw labs at the facility and fax the results to Dr. Krista Blue.  These orders have been faxed to Nicole's attention at [phone].  DR.YAN'S FAX NUMBER FOR RESULTS:  [phone].

## 2018-06-05 NOTE — Telephone Encounter (Signed)
Reminder from Dr. Krista Blue (in 01/2018):  Ocrevus infusion started inOctober 2018, last infusion was in December 2019  Laboratory evaluation showed abnormal liver functional test, will repeat laboratory evaluations,  If there is significant abnormality, will hold next ocrelizumab infusion,    She has fixed contraction of bilateral lower extremity, have asked patient and her staff to evaluate the benefit, if there is no significant change in her pain, will not continue injection.    Her next De Nurse is due in June 2019. Intrafusion has been asked to hold Ocrevus until further instructions from MD.

## 2018-06-05 NOTE — Telephone Encounter (Signed)
I ordered CMP, CBC  If it is normal, we can resume Ocrevus.

## 2018-06-07 DIAGNOSIS — L89324 Pressure ulcer of left buttock, stage 4: Secondary | ICD-10-CM | POA: Diagnosis not present

## 2018-06-07 DIAGNOSIS — M6281 Muscle weakness (generalized): Secondary | ICD-10-CM | POA: Diagnosis not present

## 2018-06-07 DIAGNOSIS — E43 Unspecified severe protein-calorie malnutrition: Secondary | ICD-10-CM | POA: Diagnosis not present

## 2018-06-07 DIAGNOSIS — L89314 Pressure ulcer of right buttock, stage 4: Secondary | ICD-10-CM | POA: Diagnosis not present

## 2018-06-08 DIAGNOSIS — F4321 Adjustment disorder with depressed mood: Secondary | ICD-10-CM | POA: Diagnosis not present

## 2018-06-14 DIAGNOSIS — L89314 Pressure ulcer of right buttock, stage 4: Secondary | ICD-10-CM | POA: Diagnosis not present

## 2018-06-14 DIAGNOSIS — E43 Unspecified severe protein-calorie malnutrition: Secondary | ICD-10-CM | POA: Diagnosis not present

## 2018-06-14 DIAGNOSIS — L89324 Pressure ulcer of left buttock, stage 4: Secondary | ICD-10-CM | POA: Diagnosis not present

## 2018-06-14 DIAGNOSIS — M6281 Muscle weakness (generalized): Secondary | ICD-10-CM | POA: Diagnosis not present

## 2018-06-15 DIAGNOSIS — F4321 Adjustment disorder with depressed mood: Secondary | ICD-10-CM | POA: Diagnosis not present

## 2018-06-21 DIAGNOSIS — E43 Unspecified severe protein-calorie malnutrition: Secondary | ICD-10-CM | POA: Diagnosis not present

## 2018-06-21 DIAGNOSIS — M6281 Muscle weakness (generalized): Secondary | ICD-10-CM | POA: Diagnosis not present

## 2018-06-21 DIAGNOSIS — L89324 Pressure ulcer of left buttock, stage 4: Secondary | ICD-10-CM | POA: Diagnosis not present

## 2018-06-21 DIAGNOSIS — L89314 Pressure ulcer of right buttock, stage 4: Secondary | ICD-10-CM | POA: Diagnosis not present

## 2018-06-22 DIAGNOSIS — G35 Multiple sclerosis: Secondary | ICD-10-CM | POA: Diagnosis not present

## 2018-06-22 DIAGNOSIS — D649 Anemia, unspecified: Secondary | ICD-10-CM | POA: Diagnosis not present

## 2018-06-22 DIAGNOSIS — E038 Other specified hypothyroidism: Secondary | ICD-10-CM | POA: Diagnosis not present

## 2018-06-22 DIAGNOSIS — Z79899 Other long term (current) drug therapy: Secondary | ICD-10-CM | POA: Diagnosis not present

## 2018-06-22 DIAGNOSIS — D508 Other iron deficiency anemias: Secondary | ICD-10-CM | POA: Diagnosis not present

## 2018-06-22 DIAGNOSIS — E43 Unspecified severe protein-calorie malnutrition: Secondary | ICD-10-CM | POA: Diagnosis not present

## 2018-06-23 DIAGNOSIS — F4321 Adjustment disorder with depressed mood: Secondary | ICD-10-CM | POA: Diagnosis not present

## 2018-06-27 NOTE — Telephone Encounter (Signed)
Labs collected at [facility] on 06/22/2018.  CBC w/ Diff: RBC: 3.10 (L) HGB: 9.9 (L) HCT: 29.5 (L) MCV: 95.3 (H) PLT: 500 (H) All other values WNL.  Direct Bilirubin: <0.200 (WNL)  CMP w/ eGFR: Glucose: 122 (H) BUN: 28.5 (H) BUN/CREAT Ratio: 45.2 (H) Alkphos: 159 (H) All other values WNL.

## 2018-06-27 NOTE — Telephone Encounter (Signed)
Which facility is she in, please call to see if there is any COVID 19 cases, if she has any clinical progression of her MS symptoms.  She belongs to the high risk group for infusion (from NH, and infusion will further decrease her lympocyte), and she is at secondary progressive MS, risk vs benefit?  If there is no significant clinical change, will hold off infusion at this point,

## 2018-06-27 NOTE — Telephone Encounter (Signed)
I called [facility] [phone]) and left a message for her nurse, Elmyra Ricks.  We need the following questions answered:  1) Has the facility had any active cases of COVID-19?  2) Has any progression in MS symptoms been observed in the patient?    She currently has a pending appt for Xeomin on 07/05/2018.  She is also due for her next infusion of Ocrevus.  Dr. Krista Blue will make a decision on both of these appts once her nurse has answered the questions above.

## 2018-06-28 DIAGNOSIS — M6281 Muscle weakness (generalized): Secondary | ICD-10-CM | POA: Diagnosis not present

## 2018-06-28 DIAGNOSIS — L89314 Pressure ulcer of right buttock, stage 4: Secondary | ICD-10-CM | POA: Diagnosis not present

## 2018-06-28 DIAGNOSIS — E43 Unspecified severe protein-calorie malnutrition: Secondary | ICD-10-CM | POA: Diagnosis not present

## 2018-06-28 DIAGNOSIS — L89324 Pressure ulcer of left buttock, stage 4: Secondary | ICD-10-CM | POA: Diagnosis not present

## 2018-06-29 DIAGNOSIS — F4321 Adjustment disorder with depressed mood: Secondary | ICD-10-CM | POA: Diagnosis not present

## 2018-06-29 NOTE — Telephone Encounter (Signed)
Yes, I would like to see her at virtual visit  before we schedule her for xeomin and ocrevus infusion.

## 2018-06-29 NOTE — Telephone Encounter (Signed)
I called the facility again and was able to speak to the patient's nurse, Iyabode.  States their facility has not had any COVID 19 cases.  She reports that the patient's physical condition is the same - no worse, no better.  She has an office visit scheduled for 09/27/2018 for further discussion of treatment.  Would you like to cancel her Xeomin and Ocrevus appointments for now?

## 2018-06-29 NOTE — Telephone Encounter (Signed)
I called [name] again and spoke to [name].  She is going to get another message to the patient's nursing staff and have someone call me back today.

## 2018-06-29 NOTE — Telephone Encounter (Signed)
Iyabode informed me earlier that she was unable to assist the patient with a video visit.  However, I have been notified that they have a Education officer, museum that can help.  I left a message for Marcelina Morel (staff social worker) and requested a call back.  When [employer] calls back, the patient will need to be scheduled for a virtual visit with Dr. Krista Blue.  Please make the appt for next available, explain the process and send [email or web address] link.

## 2018-07-03 NOTE — Telephone Encounter (Signed)
I left second message for the social worker, Marcelina Morel, to return my call.  When [employer] (social worker) calls back, the patient will need to be scheduled for a virtual visit with Dr. Krista Blue.  Please make the appt for next available, explain the process and send [email or web address] link.

## 2018-07-04 ENCOUNTER — Telehealth: Payer: Self-pay | Admitting: Neurology

## 2018-07-04 DIAGNOSIS — E43 Unspecified severe protein-calorie malnutrition: Secondary | ICD-10-CM | POA: Diagnosis not present

## 2018-07-04 DIAGNOSIS — G35 Multiple sclerosis: Secondary | ICD-10-CM | POA: Diagnosis not present

## 2018-07-04 DIAGNOSIS — D508 Other iron deficiency anemias: Secondary | ICD-10-CM | POA: Diagnosis not present

## 2018-07-04 DIAGNOSIS — E038 Other specified hypothyroidism: Secondary | ICD-10-CM | POA: Diagnosis not present

## 2018-07-04 NOTE — Telephone Encounter (Signed)
°  Due to current COVID 19 pandemic, our office is severely reducing in office visits until further notice, in order to minimize the risk to our patients and healthcare providers.    Called patient and scheduled a virtual visit with Dr. Krista Blue for 07/18/18 . Patient verbalized understanding of the [email or web address] process and I have sent an e-mail to [email or web address] with link and instructions as well as my name and our office number. Patient understands that they will receive a call from RN to update chart.   Pt understands that although there may be some limitations with this type of visit, we will take all precautions to reduce any security or privacy concerns.  Pt understands that this will be treated like an in office visit and we will file with pt's insurance, and there may be a patient responsible charge related to this service.

## 2018-07-05 ENCOUNTER — Ambulatory Visit: Payer: Self-pay | Admitting: Neurology

## 2018-07-05 DIAGNOSIS — M6281 Muscle weakness (generalized): Secondary | ICD-10-CM | POA: Diagnosis not present

## 2018-07-05 DIAGNOSIS — L89314 Pressure ulcer of right buttock, stage 4: Secondary | ICD-10-CM | POA: Diagnosis not present

## 2018-07-05 DIAGNOSIS — E43 Unspecified severe protein-calorie malnutrition: Secondary | ICD-10-CM | POA: Diagnosis not present

## 2018-07-05 DIAGNOSIS — L89324 Pressure ulcer of left buttock, stage 4: Secondary | ICD-10-CM | POA: Diagnosis not present

## 2018-07-06 ENCOUNTER — Ambulatory Visit: Payer: Self-pay | Admitting: Neurology

## 2018-07-08 DIAGNOSIS — F4321 Adjustment disorder with depressed mood: Secondary | ICD-10-CM | POA: Diagnosis not present

## 2018-07-11 DIAGNOSIS — G35 Multiple sclerosis: Secondary | ICD-10-CM | POA: Diagnosis not present

## 2018-07-11 DIAGNOSIS — R5381 Other malaise: Secondary | ICD-10-CM | POA: Diagnosis not present

## 2018-07-11 DIAGNOSIS — D508 Other iron deficiency anemias: Secondary | ICD-10-CM | POA: Diagnosis not present

## 2018-07-11 DIAGNOSIS — R000 Tachycardia, unspecified: Secondary | ICD-10-CM | POA: Diagnosis not present

## 2018-07-12 DIAGNOSIS — L89324 Pressure ulcer of left buttock, stage 4: Secondary | ICD-10-CM | POA: Diagnosis not present

## 2018-07-12 DIAGNOSIS — E43 Unspecified severe protein-calorie malnutrition: Secondary | ICD-10-CM | POA: Diagnosis not present

## 2018-07-12 DIAGNOSIS — M6281 Muscle weakness (generalized): Secondary | ICD-10-CM | POA: Diagnosis not present

## 2018-07-12 DIAGNOSIS — I10 Essential (primary) hypertension: Secondary | ICD-10-CM | POA: Diagnosis not present

## 2018-07-12 DIAGNOSIS — D649 Anemia, unspecified: Secondary | ICD-10-CM | POA: Diagnosis not present

## 2018-07-12 DIAGNOSIS — E559 Vitamin D deficiency, unspecified: Secondary | ICD-10-CM | POA: Diagnosis not present

## 2018-07-12 DIAGNOSIS — E039 Hypothyroidism, unspecified: Secondary | ICD-10-CM | POA: Diagnosis not present

## 2018-07-12 DIAGNOSIS — L89314 Pressure ulcer of right buttock, stage 4: Secondary | ICD-10-CM | POA: Diagnosis not present

## 2018-07-12 DIAGNOSIS — E038 Other specified hypothyroidism: Secondary | ICD-10-CM | POA: Diagnosis not present

## 2018-07-12 DIAGNOSIS — Z79899 Other long term (current) drug therapy: Secondary | ICD-10-CM | POA: Diagnosis not present

## 2018-07-12 DIAGNOSIS — R509 Fever, unspecified: Secondary | ICD-10-CM | POA: Diagnosis not present

## 2018-07-13 DIAGNOSIS — F4321 Adjustment disorder with depressed mood: Secondary | ICD-10-CM | POA: Diagnosis not present

## 2018-07-14 DIAGNOSIS — R9431 Abnormal electrocardiogram [ECG] [EKG]: Secondary | ICD-10-CM | POA: Diagnosis not present

## 2018-07-14 DIAGNOSIS — R0602 Shortness of breath: Secondary | ICD-10-CM | POA: Diagnosis not present

## 2018-07-18 ENCOUNTER — Encounter: Payer: Self-pay | Admitting: Neurology

## 2018-07-18 ENCOUNTER — Other Ambulatory Visit: Payer: Self-pay

## 2018-07-18 ENCOUNTER — Ambulatory Visit (INDEPENDENT_AMBULATORY_CARE_PROVIDER_SITE_OTHER): Payer: Medicare Other | Admitting: Neurology

## 2018-07-18 DIAGNOSIS — G8220 Paraplegia, unspecified: Secondary | ICD-10-CM

## 2018-07-18 DIAGNOSIS — G35 Multiple sclerosis: Secondary | ICD-10-CM | POA: Diagnosis not present

## 2018-07-18 DIAGNOSIS — IMO0002 Reserved for concepts with insufficient information to code with codable children: Secondary | ICD-10-CM

## 2018-07-18 DIAGNOSIS — R000 Tachycardia, unspecified: Secondary | ICD-10-CM | POA: Diagnosis not present

## 2018-07-18 DIAGNOSIS — E43 Unspecified severe protein-calorie malnutrition: Secondary | ICD-10-CM | POA: Diagnosis not present

## 2018-07-18 DIAGNOSIS — G35A Relapsing-remitting multiple sclerosis: Secondary | ICD-10-CM

## 2018-07-18 DIAGNOSIS — E038 Other specified hypothyroidism: Secondary | ICD-10-CM | POA: Diagnosis not present

## 2018-07-18 NOTE — Progress Notes (Signed)
PATIENT: Deanna Baldwin DOB: August 22, 1975  Virtual Visit via video  I connected with [name] on 07/18/18 at  by video and verified that I am speaking with the correct person using two identifiers.   I discussed the limitations, risks, security and privacy concerns of performing an evaluation and management service by video and the availability of in person appointments. I also discussed with the patient that there may be a patient responsible charge related to this service. The patient expressed understanding and agreed to proceed.  HISTORICAL  Eiliana Drone is a 43 years old right-handed female, she is currently a resident at [facility], she is accompanied by her physical therapist [name]  and staff, seen in refer by her primary care physician Dr.  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  She lived at home with her 24 years old son and her aunt was her primary caregiver before she moved to current nursing home in 2017, her aunt visit her occasionally, facility is in charge of her medical care.  She was diagnosed with relapsing remitting multiple sclerosis since age 53, she initially presented with blurry vision, moderate fatigue, difficulty walking, she had frequent flareups over the years, eventually become wheelchair-bound since 2011,  She was under the care of neurologist at [facility] Dr. [name], I was able to review Dr. [name] note from 2013 to most recent  in February 2017, she had allergic reaction to Tysarbri IV infusion, was treated with rituximab since early 2013, 1000 mg every 6 months, she tolerated very well, infusion was in December 2016, because of her social status change, she was not able to get it every 6 months. For a while she was taking Ampyra for gait abnormality, but has stopped it because it is not helping her  Based on last neurology  examination in February 2017, she was not ambulatory, in a power wheelchair, significant bilateral lower extremity spasticity, only has trace movement of bilateral leg, upper extremity motor strengths were well-preserved.  MRI of the brain and spine in May 2015, chronic demyelinating lesions, no enhancing lesion.  She has multiple hospital admission recently, for frequent UTI, decubitus ulcer, with progressive increased weakness, also increased spasticity of bilateral lower extremity, decreased mobility to moving the chair, even to hold her sit up position,  I was able to review MRI of the cervical spine with and without contrast in May 2015, multifocal patchy area of T2/FLAIR signal abnormality in the entire cervical cord, visualized brainstem and upper thoracic spine, no contrast enhancement.  MRI of the brain 2015, stable November and distribution of numerous hyper intense flair lesions throughout the subcortical, deep and periventricular white matter, involvement of entire corpus callosum, as well as midbrain and pons, right cerebellum.  MRI of the brain 2012, questionable enhancement,  UPDATE Jan 19 2017: MRI of the brain with and without contrast in June 2018: 1. Multiple infratentorial and hemispheric T2/FLAIR hyperintense foci consistent with chronic demyelinating plaque associated with multiple sclerosis. None of the foci appeared to be acute. 2. Moderate cortical atrophy and corpus callosum atrophy with mild brainstem atrophy. 3. There are no acute findings.  MRI of the cervical spine without contrast   1. Multiple T2 hyperintense foci within the spinal cord as detailed above. IV access was not obtainable for contrast. 2. Mild disc bulges at C3-C4, C4-C5 and C5-C6 but do not lead to any nerve root compression  or central canal narrowing.  Patient received Rituxan 1000mg  on 10/11/16 from [facility] Dr. [name] office,.  She received Orevus 300mg  on 11/10/16 from  intra-fusion.  Patient stated [facility], wants to keep appointment with [place]  Laboratory evaluation on November 29 2016 from [facility], RDW was elevated at 17.4, CD19 less than 1%, 20, less than 1%, normal liver functional test  She is wheelchair-bound, no memory loss, significant visual difficulty, spastic paraplegia  UPDATE Mar 09 2017: She came in for her first electrical stimulation guided xeomin injection for spastic paraplegia, we used 500 xeomin  UPDATE July 28 2017: She did receive her ocrelizumab infusion recently, she is here for repeat xeomin injection for spastic bilateral lower extremity,  UPDATE Sept 25 2019: Previous xeomin injection did help relaxing her leg, last ocrelizumab injection was on July 03 2017.   UPDATE Jan 19 2018: She was not sure about the benefit of previous Xeomin injection, she has fixed contraction of bilateral lower extremity, pain with passive movement,  Laboratory evaluation in September 2019 continue show abnormal liver functional test, rising alkaline phosphate 598, ALT 124, AST 70,  Last ocrelizumab infusion was in December 2019, will repeat laboratory evaluation, if there is significant abnormality, will hold off ocrelizumab infusion,  Virtual Visit via Video  I connected with [name] on 07/18/18 at  by Video and verified that I am speaking with the correct person using two identifiers.   I discussed the limitations, risks, security and privacy concerns of performing an evaluation and management service by video and the availability of in person appointments. I also discussed with the patient that there may be a patient responsible charge related to this service. The patient expressed understanding and agreed to proceed.   History of Present Illness: She denies significant improvement from previous botulism toxin injection, has lost bilateral lower extremity mobility, decubitus ulcer is healing, she does not want to  continue botulism toxin injection, last ocrelizumab infusion was in December 2019, after I discussed with patient, she wants to hold off ocrelizumab infusion at this point,   Observations/Objective: I have reviewed problem lists, medications, allergies. Awake, alert, mild dysarthria, moving both upper extremities without difficulties, fixed contraction of bilateral lower extremity  Assessment and Plan: Relapsing remitting multiple sclerosis             Allergic reaction to Yehuda Budd virus was positive with titer of 3.48 on Jun 24 2016             Her disease was able to stabilize by previous rituximab infusion, 1000 units every 6 months, last infusion was at the [facility] in September 2018              Ocrevus infusion started in October 2018, last infusion was in December 2019             Laboratory evaluation showed abnormal liver functional test, will repeat laboratory evaluations,             After discussed patient with patient, she is secondary progressive MS, there is no significant change of the past few years, we decided to hold of her ocrelizumab infusion at this point, due to COVID-19 concerns,  Return to clinic in 3 months  Spastic paraplegia  Will hold of botulism toxin injection at this point  Follow Up Instructions:   Virtual visit in 3 months   I discussed the assessment and treatment plan with the patient. The patient was provided an opportunity  to ask questions and all were answered. The patient agreed with the plan and demonstrated an understanding of the instructions.   The patient was advised to call back or seek an in-person evaluation if the symptoms worsen or if the condition fails to improve as anticipated.  I provided 30 minutes of non-face-to-face time during this encounter.   Marcial Pacas, MD. Ph.D.

## 2018-07-19 DIAGNOSIS — E43 Unspecified severe protein-calorie malnutrition: Secondary | ICD-10-CM | POA: Diagnosis not present

## 2018-07-19 DIAGNOSIS — L89314 Pressure ulcer of right buttock, stage 4: Secondary | ICD-10-CM | POA: Diagnosis not present

## 2018-07-19 DIAGNOSIS — M6281 Muscle weakness (generalized): Secondary | ICD-10-CM | POA: Diagnosis not present

## 2018-07-20 DIAGNOSIS — R000 Tachycardia, unspecified: Secondary | ICD-10-CM | POA: Diagnosis not present

## 2018-07-20 DIAGNOSIS — G35 Multiple sclerosis: Secondary | ICD-10-CM | POA: Diagnosis not present

## 2018-07-20 DIAGNOSIS — E038 Other specified hypothyroidism: Secondary | ICD-10-CM | POA: Diagnosis not present

## 2018-07-20 DIAGNOSIS — E43 Unspecified severe protein-calorie malnutrition: Secondary | ICD-10-CM | POA: Diagnosis not present

## 2018-07-26 DIAGNOSIS — L89314 Pressure ulcer of right buttock, stage 4: Secondary | ICD-10-CM | POA: Diagnosis not present

## 2018-07-26 DIAGNOSIS — E43 Unspecified severe protein-calorie malnutrition: Secondary | ICD-10-CM | POA: Diagnosis not present

## 2018-07-26 DIAGNOSIS — M6281 Muscle weakness (generalized): Secondary | ICD-10-CM | POA: Diagnosis not present

## 2018-07-28 DIAGNOSIS — F4321 Adjustment disorder with depressed mood: Secondary | ICD-10-CM | POA: Diagnosis not present

## 2018-07-31 DIAGNOSIS — F332 Major depressive disorder, recurrent severe without psychotic features: Secondary | ICD-10-CM | POA: Diagnosis not present

## 2018-07-31 DIAGNOSIS — E038 Other specified hypothyroidism: Secondary | ICD-10-CM | POA: Diagnosis not present

## 2018-08-02 DIAGNOSIS — M6281 Muscle weakness (generalized): Secondary | ICD-10-CM | POA: Diagnosis not present

## 2018-08-02 DIAGNOSIS — E43 Unspecified severe protein-calorie malnutrition: Secondary | ICD-10-CM | POA: Diagnosis not present

## 2018-08-02 DIAGNOSIS — L89314 Pressure ulcer of right buttock, stage 4: Secondary | ICD-10-CM | POA: Diagnosis not present

## 2018-08-05 DIAGNOSIS — F4321 Adjustment disorder with depressed mood: Secondary | ICD-10-CM | POA: Diagnosis not present

## 2018-08-09 DIAGNOSIS — E43 Unspecified severe protein-calorie malnutrition: Secondary | ICD-10-CM | POA: Diagnosis not present

## 2018-08-09 DIAGNOSIS — G35 Multiple sclerosis: Secondary | ICD-10-CM | POA: Diagnosis not present

## 2018-08-09 DIAGNOSIS — M6281 Muscle weakness (generalized): Secondary | ICD-10-CM | POA: Diagnosis not present

## 2018-08-09 DIAGNOSIS — E038 Other specified hypothyroidism: Secondary | ICD-10-CM | POA: Diagnosis not present

## 2018-08-09 DIAGNOSIS — R000 Tachycardia, unspecified: Secondary | ICD-10-CM | POA: Diagnosis not present

## 2018-08-09 DIAGNOSIS — F4321 Adjustment disorder with depressed mood: Secondary | ICD-10-CM | POA: Diagnosis not present

## 2018-08-09 DIAGNOSIS — L89314 Pressure ulcer of right buttock, stage 4: Secondary | ICD-10-CM | POA: Diagnosis not present

## 2018-08-15 ENCOUNTER — Non-Acute Institutional Stay: Payer: Medicare Other | Admitting: Internal Medicine

## 2018-08-16 DIAGNOSIS — L89323 Pressure ulcer of left buttock, stage 3: Secondary | ICD-10-CM | POA: Diagnosis not present

## 2018-08-16 DIAGNOSIS — M6281 Muscle weakness (generalized): Secondary | ICD-10-CM | POA: Diagnosis not present

## 2018-08-16 DIAGNOSIS — E43 Unspecified severe protein-calorie malnutrition: Secondary | ICD-10-CM | POA: Diagnosis not present

## 2018-08-16 DIAGNOSIS — F4321 Adjustment disorder with depressed mood: Secondary | ICD-10-CM | POA: Diagnosis not present

## 2018-08-16 DIAGNOSIS — L89313 Pressure ulcer of right buttock, stage 3: Secondary | ICD-10-CM | POA: Diagnosis not present

## 2018-08-17 ENCOUNTER — Other Ambulatory Visit: Payer: Self-pay

## 2018-08-17 DIAGNOSIS — G35 Multiple sclerosis: Secondary | ICD-10-CM | POA: Diagnosis not present

## 2018-08-17 DIAGNOSIS — F4321 Adjustment disorder with depressed mood: Secondary | ICD-10-CM | POA: Diagnosis not present

## 2018-08-23 DIAGNOSIS — E43 Unspecified severe protein-calorie malnutrition: Secondary | ICD-10-CM | POA: Diagnosis not present

## 2018-08-23 DIAGNOSIS — L89323 Pressure ulcer of left buttock, stage 3: Secondary | ICD-10-CM | POA: Diagnosis not present

## 2018-08-23 DIAGNOSIS — F4321 Adjustment disorder with depressed mood: Secondary | ICD-10-CM | POA: Diagnosis not present

## 2018-08-23 DIAGNOSIS — L89313 Pressure ulcer of right buttock, stage 3: Secondary | ICD-10-CM | POA: Diagnosis not present

## 2018-08-23 DIAGNOSIS — M6281 Muscle weakness (generalized): Secondary | ICD-10-CM | POA: Diagnosis not present

## 2018-08-28 DIAGNOSIS — M6249 Contracture of muscle, multiple sites: Secondary | ICD-10-CM | POA: Diagnosis not present

## 2018-08-28 DIAGNOSIS — G114 Hereditary spastic paraplegia: Secondary | ICD-10-CM | POA: Diagnosis not present

## 2018-08-28 DIAGNOSIS — R293 Abnormal posture: Secondary | ICD-10-CM | POA: Diagnosis not present

## 2018-08-28 DIAGNOSIS — E43 Unspecified severe protein-calorie malnutrition: Secondary | ICD-10-CM | POA: Diagnosis not present

## 2018-08-28 DIAGNOSIS — G35 Multiple sclerosis: Secondary | ICD-10-CM | POA: Diagnosis not present

## 2018-08-29 DIAGNOSIS — G35 Multiple sclerosis: Secondary | ICD-10-CM | POA: Diagnosis not present

## 2018-08-29 DIAGNOSIS — E43 Unspecified severe protein-calorie malnutrition: Secondary | ICD-10-CM | POA: Diagnosis not present

## 2018-08-29 DIAGNOSIS — R293 Abnormal posture: Secondary | ICD-10-CM | POA: Diagnosis not present

## 2018-08-29 DIAGNOSIS — G114 Hereditary spastic paraplegia: Secondary | ICD-10-CM | POA: Diagnosis not present

## 2018-08-29 DIAGNOSIS — M6249 Contracture of muscle, multiple sites: Secondary | ICD-10-CM | POA: Diagnosis not present

## 2018-08-30 DIAGNOSIS — G114 Hereditary spastic paraplegia: Secondary | ICD-10-CM | POA: Diagnosis not present

## 2018-08-30 DIAGNOSIS — L89313 Pressure ulcer of right buttock, stage 3: Secondary | ICD-10-CM | POA: Diagnosis not present

## 2018-08-30 DIAGNOSIS — R293 Abnormal posture: Secondary | ICD-10-CM | POA: Diagnosis not present

## 2018-08-30 DIAGNOSIS — E43 Unspecified severe protein-calorie malnutrition: Secondary | ICD-10-CM | POA: Diagnosis not present

## 2018-08-30 DIAGNOSIS — M6249 Contracture of muscle, multiple sites: Secondary | ICD-10-CM | POA: Diagnosis not present

## 2018-08-30 DIAGNOSIS — M6281 Muscle weakness (generalized): Secondary | ICD-10-CM | POA: Diagnosis not present

## 2018-08-30 DIAGNOSIS — G35 Multiple sclerosis: Secondary | ICD-10-CM | POA: Diagnosis not present

## 2018-08-30 DIAGNOSIS — L89323 Pressure ulcer of left buttock, stage 3: Secondary | ICD-10-CM | POA: Diagnosis not present

## 2018-08-31 DIAGNOSIS — G114 Hereditary spastic paraplegia: Secondary | ICD-10-CM | POA: Diagnosis not present

## 2018-08-31 DIAGNOSIS — G35 Multiple sclerosis: Secondary | ICD-10-CM | POA: Diagnosis not present

## 2018-08-31 DIAGNOSIS — R293 Abnormal posture: Secondary | ICD-10-CM | POA: Diagnosis not present

## 2018-08-31 DIAGNOSIS — E43 Unspecified severe protein-calorie malnutrition: Secondary | ICD-10-CM | POA: Diagnosis not present

## 2018-08-31 DIAGNOSIS — M6249 Contracture of muscle, multiple sites: Secondary | ICD-10-CM | POA: Diagnosis not present

## 2018-09-01 DIAGNOSIS — F332 Major depressive disorder, recurrent severe without psychotic features: Secondary | ICD-10-CM | POA: Diagnosis not present

## 2018-09-01 DIAGNOSIS — E038 Other specified hypothyroidism: Secondary | ICD-10-CM | POA: Diagnosis not present

## 2018-09-03 DIAGNOSIS — G35 Multiple sclerosis: Secondary | ICD-10-CM | POA: Diagnosis not present

## 2018-09-03 DIAGNOSIS — E43 Unspecified severe protein-calorie malnutrition: Secondary | ICD-10-CM | POA: Diagnosis not present

## 2018-09-03 DIAGNOSIS — G114 Hereditary spastic paraplegia: Secondary | ICD-10-CM | POA: Diagnosis not present

## 2018-09-03 DIAGNOSIS — R293 Abnormal posture: Secondary | ICD-10-CM | POA: Diagnosis not present

## 2018-09-03 DIAGNOSIS — M6249 Contracture of muscle, multiple sites: Secondary | ICD-10-CM | POA: Diagnosis not present

## 2018-09-04 DIAGNOSIS — G35 Multiple sclerosis: Secondary | ICD-10-CM | POA: Diagnosis not present

## 2018-09-04 DIAGNOSIS — R293 Abnormal posture: Secondary | ICD-10-CM | POA: Diagnosis not present

## 2018-09-04 DIAGNOSIS — G114 Hereditary spastic paraplegia: Secondary | ICD-10-CM | POA: Diagnosis not present

## 2018-09-04 DIAGNOSIS — E43 Unspecified severe protein-calorie malnutrition: Secondary | ICD-10-CM | POA: Diagnosis not present

## 2018-09-04 DIAGNOSIS — M6249 Contracture of muscle, multiple sites: Secondary | ICD-10-CM | POA: Diagnosis not present

## 2018-09-05 DIAGNOSIS — G35 Multiple sclerosis: Secondary | ICD-10-CM | POA: Diagnosis not present

## 2018-09-05 DIAGNOSIS — E43 Unspecified severe protein-calorie malnutrition: Secondary | ICD-10-CM | POA: Diagnosis not present

## 2018-09-05 DIAGNOSIS — G114 Hereditary spastic paraplegia: Secondary | ICD-10-CM | POA: Diagnosis not present

## 2018-09-05 DIAGNOSIS — M6249 Contracture of muscle, multiple sites: Secondary | ICD-10-CM | POA: Diagnosis not present

## 2018-09-05 DIAGNOSIS — R293 Abnormal posture: Secondary | ICD-10-CM | POA: Diagnosis not present

## 2018-09-06 DIAGNOSIS — L89323 Pressure ulcer of left buttock, stage 3: Secondary | ICD-10-CM | POA: Diagnosis not present

## 2018-09-06 DIAGNOSIS — M6249 Contracture of muscle, multiple sites: Secondary | ICD-10-CM | POA: Diagnosis not present

## 2018-09-06 DIAGNOSIS — G114 Hereditary spastic paraplegia: Secondary | ICD-10-CM | POA: Diagnosis not present

## 2018-09-06 DIAGNOSIS — R293 Abnormal posture: Secondary | ICD-10-CM | POA: Diagnosis not present

## 2018-09-06 DIAGNOSIS — E43 Unspecified severe protein-calorie malnutrition: Secondary | ICD-10-CM | POA: Diagnosis not present

## 2018-09-06 DIAGNOSIS — L89313 Pressure ulcer of right buttock, stage 3: Secondary | ICD-10-CM | POA: Diagnosis not present

## 2018-09-06 DIAGNOSIS — M6281 Muscle weakness (generalized): Secondary | ICD-10-CM | POA: Diagnosis not present

## 2018-09-06 DIAGNOSIS — G35 Multiple sclerosis: Secondary | ICD-10-CM | POA: Diagnosis not present

## 2018-09-07 DIAGNOSIS — E43 Unspecified severe protein-calorie malnutrition: Secondary | ICD-10-CM | POA: Diagnosis not present

## 2018-09-07 DIAGNOSIS — G114 Hereditary spastic paraplegia: Secondary | ICD-10-CM | POA: Diagnosis not present

## 2018-09-07 DIAGNOSIS — M6249 Contracture of muscle, multiple sites: Secondary | ICD-10-CM | POA: Diagnosis not present

## 2018-09-07 DIAGNOSIS — R293 Abnormal posture: Secondary | ICD-10-CM | POA: Diagnosis not present

## 2018-09-07 DIAGNOSIS — F4321 Adjustment disorder with depressed mood: Secondary | ICD-10-CM | POA: Diagnosis not present

## 2018-09-07 DIAGNOSIS — G35 Multiple sclerosis: Secondary | ICD-10-CM | POA: Diagnosis not present

## 2018-09-08 ENCOUNTER — Telehealth: Payer: Self-pay

## 2018-09-08 DIAGNOSIS — D508 Other iron deficiency anemias: Secondary | ICD-10-CM | POA: Diagnosis not present

## 2018-09-08 DIAGNOSIS — G35 Multiple sclerosis: Secondary | ICD-10-CM | POA: Diagnosis not present

## 2018-09-08 DIAGNOSIS — R5381 Other malaise: Secondary | ICD-10-CM | POA: Diagnosis not present

## 2018-09-08 DIAGNOSIS — I9589 Other hypotension: Secondary | ICD-10-CM | POA: Diagnosis not present

## 2018-09-08 NOTE — Telephone Encounter (Signed)
Left Message for Barth Kirks, LPN to give our office a call back to in reference to a referral that was sent by her facility. We need to get the patient on a Doctors schedule

## 2018-09-09 DIAGNOSIS — E039 Hypothyroidism, unspecified: Secondary | ICD-10-CM | POA: Diagnosis not present

## 2018-09-09 DIAGNOSIS — E559 Vitamin D deficiency, unspecified: Secondary | ICD-10-CM | POA: Diagnosis not present

## 2018-09-09 DIAGNOSIS — E785 Hyperlipidemia, unspecified: Secondary | ICD-10-CM | POA: Diagnosis not present

## 2018-09-09 DIAGNOSIS — E119 Type 2 diabetes mellitus without complications: Secondary | ICD-10-CM | POA: Diagnosis not present

## 2018-09-09 DIAGNOSIS — D509 Iron deficiency anemia, unspecified: Secondary | ICD-10-CM | POA: Diagnosis not present

## 2018-09-09 DIAGNOSIS — Z79899 Other long term (current) drug therapy: Secondary | ICD-10-CM | POA: Diagnosis not present

## 2018-09-09 DIAGNOSIS — E038 Other specified hypothyroidism: Secondary | ICD-10-CM | POA: Diagnosis not present

## 2018-09-09 DIAGNOSIS — D649 Anemia, unspecified: Secondary | ICD-10-CM | POA: Diagnosis not present

## 2018-09-09 DIAGNOSIS — E43 Unspecified severe protein-calorie malnutrition: Secondary | ICD-10-CM | POA: Diagnosis not present

## 2018-09-10 DIAGNOSIS — G114 Hereditary spastic paraplegia: Secondary | ICD-10-CM | POA: Diagnosis not present

## 2018-09-10 DIAGNOSIS — M6249 Contracture of muscle, multiple sites: Secondary | ICD-10-CM | POA: Diagnosis not present

## 2018-09-10 DIAGNOSIS — E43 Unspecified severe protein-calorie malnutrition: Secondary | ICD-10-CM | POA: Diagnosis not present

## 2018-09-10 DIAGNOSIS — R293 Abnormal posture: Secondary | ICD-10-CM | POA: Diagnosis not present

## 2018-09-10 DIAGNOSIS — G35 Multiple sclerosis: Secondary | ICD-10-CM | POA: Diagnosis not present

## 2018-09-11 DIAGNOSIS — G114 Hereditary spastic paraplegia: Secondary | ICD-10-CM | POA: Diagnosis not present

## 2018-09-11 DIAGNOSIS — R293 Abnormal posture: Secondary | ICD-10-CM | POA: Diagnosis not present

## 2018-09-11 DIAGNOSIS — G35 Multiple sclerosis: Secondary | ICD-10-CM | POA: Diagnosis not present

## 2018-09-11 DIAGNOSIS — E43 Unspecified severe protein-calorie malnutrition: Secondary | ICD-10-CM | POA: Diagnosis not present

## 2018-09-11 DIAGNOSIS — M6249 Contracture of muscle, multiple sites: Secondary | ICD-10-CM | POA: Diagnosis not present

## 2018-09-12 DIAGNOSIS — M6249 Contracture of muscle, multiple sites: Secondary | ICD-10-CM | POA: Diagnosis not present

## 2018-09-12 DIAGNOSIS — G35 Multiple sclerosis: Secondary | ICD-10-CM | POA: Diagnosis not present

## 2018-09-12 DIAGNOSIS — G114 Hereditary spastic paraplegia: Secondary | ICD-10-CM | POA: Diagnosis not present

## 2018-09-12 DIAGNOSIS — R293 Abnormal posture: Secondary | ICD-10-CM | POA: Diagnosis not present

## 2018-09-12 DIAGNOSIS — E43 Unspecified severe protein-calorie malnutrition: Secondary | ICD-10-CM | POA: Diagnosis not present

## 2018-09-13 DIAGNOSIS — G114 Hereditary spastic paraplegia: Secondary | ICD-10-CM | POA: Diagnosis not present

## 2018-09-13 DIAGNOSIS — E43 Unspecified severe protein-calorie malnutrition: Secondary | ICD-10-CM | POA: Diagnosis not present

## 2018-09-13 DIAGNOSIS — R293 Abnormal posture: Secondary | ICD-10-CM | POA: Diagnosis not present

## 2018-09-13 DIAGNOSIS — G35 Multiple sclerosis: Secondary | ICD-10-CM | POA: Diagnosis not present

## 2018-09-13 DIAGNOSIS — L89323 Pressure ulcer of left buttock, stage 3: Secondary | ICD-10-CM | POA: Diagnosis not present

## 2018-09-13 DIAGNOSIS — M6281 Muscle weakness (generalized): Secondary | ICD-10-CM | POA: Diagnosis not present

## 2018-09-13 DIAGNOSIS — L89313 Pressure ulcer of right buttock, stage 3: Secondary | ICD-10-CM | POA: Diagnosis not present

## 2018-09-13 DIAGNOSIS — M6249 Contracture of muscle, multiple sites: Secondary | ICD-10-CM | POA: Diagnosis not present

## 2018-09-14 DIAGNOSIS — M6249 Contracture of muscle, multiple sites: Secondary | ICD-10-CM | POA: Diagnosis not present

## 2018-09-14 DIAGNOSIS — R293 Abnormal posture: Secondary | ICD-10-CM | POA: Diagnosis not present

## 2018-09-14 DIAGNOSIS — D508 Other iron deficiency anemias: Secondary | ICD-10-CM | POA: Diagnosis not present

## 2018-09-14 DIAGNOSIS — R5381 Other malaise: Secondary | ICD-10-CM | POA: Diagnosis not present

## 2018-09-14 DIAGNOSIS — G35 Multiple sclerosis: Secondary | ICD-10-CM | POA: Diagnosis not present

## 2018-09-14 DIAGNOSIS — K219 Gastro-esophageal reflux disease without esophagitis: Secondary | ICD-10-CM | POA: Diagnosis not present

## 2018-09-14 DIAGNOSIS — E43 Unspecified severe protein-calorie malnutrition: Secondary | ICD-10-CM | POA: Diagnosis not present

## 2018-09-14 DIAGNOSIS — G114 Hereditary spastic paraplegia: Secondary | ICD-10-CM | POA: Diagnosis not present

## 2018-09-15 DIAGNOSIS — I471 Supraventricular tachycardia: Secondary | ICD-10-CM | POA: Diagnosis not present

## 2018-09-17 DIAGNOSIS — M6249 Contracture of muscle, multiple sites: Secondary | ICD-10-CM | POA: Diagnosis not present

## 2018-09-17 DIAGNOSIS — G114 Hereditary spastic paraplegia: Secondary | ICD-10-CM | POA: Diagnosis not present

## 2018-09-17 DIAGNOSIS — R293 Abnormal posture: Secondary | ICD-10-CM | POA: Diagnosis not present

## 2018-09-17 DIAGNOSIS — G35 Multiple sclerosis: Secondary | ICD-10-CM | POA: Diagnosis not present

## 2018-09-17 DIAGNOSIS — E43 Unspecified severe protein-calorie malnutrition: Secondary | ICD-10-CM | POA: Diagnosis not present

## 2018-09-18 DIAGNOSIS — E43 Unspecified severe protein-calorie malnutrition: Secondary | ICD-10-CM | POA: Diagnosis not present

## 2018-09-18 DIAGNOSIS — M6249 Contracture of muscle, multiple sites: Secondary | ICD-10-CM | POA: Diagnosis not present

## 2018-09-18 DIAGNOSIS — G35 Multiple sclerosis: Secondary | ICD-10-CM | POA: Diagnosis not present

## 2018-09-18 DIAGNOSIS — G114 Hereditary spastic paraplegia: Secondary | ICD-10-CM | POA: Diagnosis not present

## 2018-09-18 DIAGNOSIS — R293 Abnormal posture: Secondary | ICD-10-CM | POA: Diagnosis not present

## 2018-09-19 DIAGNOSIS — G114 Hereditary spastic paraplegia: Secondary | ICD-10-CM | POA: Diagnosis not present

## 2018-09-19 DIAGNOSIS — E43 Unspecified severe protein-calorie malnutrition: Secondary | ICD-10-CM | POA: Diagnosis not present

## 2018-09-19 DIAGNOSIS — M6249 Contracture of muscle, multiple sites: Secondary | ICD-10-CM | POA: Diagnosis not present

## 2018-09-19 DIAGNOSIS — G35 Multiple sclerosis: Secondary | ICD-10-CM | POA: Diagnosis not present

## 2018-09-19 DIAGNOSIS — R293 Abnormal posture: Secondary | ICD-10-CM | POA: Diagnosis not present

## 2018-09-20 DIAGNOSIS — E43 Unspecified severe protein-calorie malnutrition: Secondary | ICD-10-CM | POA: Diagnosis not present

## 2018-09-20 DIAGNOSIS — M6249 Contracture of muscle, multiple sites: Secondary | ICD-10-CM | POA: Diagnosis not present

## 2018-09-20 DIAGNOSIS — G35 Multiple sclerosis: Secondary | ICD-10-CM | POA: Diagnosis not present

## 2018-09-20 DIAGNOSIS — R293 Abnormal posture: Secondary | ICD-10-CM | POA: Diagnosis not present

## 2018-09-20 DIAGNOSIS — G114 Hereditary spastic paraplegia: Secondary | ICD-10-CM | POA: Diagnosis not present

## 2018-09-21 DIAGNOSIS — M6249 Contracture of muscle, multiple sites: Secondary | ICD-10-CM | POA: Diagnosis not present

## 2018-09-21 DIAGNOSIS — R293 Abnormal posture: Secondary | ICD-10-CM | POA: Diagnosis not present

## 2018-09-21 DIAGNOSIS — E43 Unspecified severe protein-calorie malnutrition: Secondary | ICD-10-CM | POA: Diagnosis not present

## 2018-09-21 DIAGNOSIS — G35 Multiple sclerosis: Secondary | ICD-10-CM | POA: Diagnosis not present

## 2018-09-21 DIAGNOSIS — G114 Hereditary spastic paraplegia: Secondary | ICD-10-CM | POA: Diagnosis not present

## 2018-09-22 DIAGNOSIS — E43 Unspecified severe protein-calorie malnutrition: Secondary | ICD-10-CM | POA: Diagnosis not present

## 2018-09-22 DIAGNOSIS — E039 Hypothyroidism, unspecified: Secondary | ICD-10-CM | POA: Diagnosis not present

## 2018-09-22 DIAGNOSIS — F332 Major depressive disorder, recurrent severe without psychotic features: Secondary | ICD-10-CM | POA: Diagnosis not present

## 2018-09-22 DIAGNOSIS — E038 Other specified hypothyroidism: Secondary | ICD-10-CM | POA: Diagnosis not present

## 2018-09-24 DIAGNOSIS — E43 Unspecified severe protein-calorie malnutrition: Secondary | ICD-10-CM | POA: Diagnosis not present

## 2018-09-24 DIAGNOSIS — R293 Abnormal posture: Secondary | ICD-10-CM | POA: Diagnosis not present

## 2018-09-24 DIAGNOSIS — G114 Hereditary spastic paraplegia: Secondary | ICD-10-CM | POA: Diagnosis not present

## 2018-09-24 DIAGNOSIS — M6249 Contracture of muscle, multiple sites: Secondary | ICD-10-CM | POA: Diagnosis not present

## 2018-09-24 DIAGNOSIS — G35 Multiple sclerosis: Secondary | ICD-10-CM | POA: Diagnosis not present

## 2018-09-26 DIAGNOSIS — G35 Multiple sclerosis: Secondary | ICD-10-CM | POA: Diagnosis not present

## 2018-09-26 DIAGNOSIS — R293 Abnormal posture: Secondary | ICD-10-CM | POA: Diagnosis not present

## 2018-09-26 DIAGNOSIS — E43 Unspecified severe protein-calorie malnutrition: Secondary | ICD-10-CM | POA: Diagnosis not present

## 2018-09-26 DIAGNOSIS — G114 Hereditary spastic paraplegia: Secondary | ICD-10-CM | POA: Diagnosis not present

## 2018-09-26 DIAGNOSIS — M6249 Contracture of muscle, multiple sites: Secondary | ICD-10-CM | POA: Diagnosis not present

## 2018-09-26 NOTE — Progress Notes (Signed)
Virtual Visit via Video Note   This visit type was conducted due to national recommendations for restrictions regarding the COVID-19 Pandemic (e.g. social distancing) in an effort to limit this patient's exposure and mitigate transmission in our community.  Due to his co-morbid illnesses, this patient is at least at moderate risk for complications without adequate follow up.  This format is felt to be most appropriate for this patient at this time.  All issues noted in this document were discussed and addressed.  A limited physical exam was performed with this format.  Please refer to the patient's chart for his consent to telehealth for [facility].    HPI: 43 year old female with history of multiple sclerosis, severe protein calorie malnutrition, spastic paraplegia and decubitus ulcer for evaluation of tachycardia. Patient previously seen by Dr. Marlou Porch May 25, 2016.  She was felt to have sinus tachycardia at that time potentially reactive to recent infection.  Echocardiogram July 2018 showed normal LV function.  Seen recently at facility where she resides and noted to be tachycardic.  Electrocardiogram is said to have sinus tachycardia with heart rates between 100-120 and septal infarct cannot be excluded.  Cardiology asked to evaluate.  Patient states that at that time of tachycardia her decubitus ulcer had not healed and she was having pain from this issue.  Her TSH was also low at 0.02 based on outside notes and her levothyroxine dose was adjusted.  Patient denies dyspnea, chest pain, palpitations or syncope.  No fevers or melena.  Current Outpatient Medications  Medication Sig Dispense Refill  . acetaminophen (TYLENOL) 325 MG tablet Take 2 tablets (650 mg total) by mouth every 6 (six) hours as needed for mild pain (or Fever >/= 101).    . Ascorbic Acid (VITAMIN C PO) Take 500 mg by mouth 2 (two) times daily.     . bisacodyl (DULCOLAX) 5 MG EC tablet Take 5 mg by mouth daily.    .  Calcium Carbonate-Vitamin D3 600-400 MG-UNIT TABS Take 2 tablets by mouth 3 (three) times daily.    . Cyanocobalamin (VITAMIN B-12 PO) Take 1 tablet by mouth daily.    Marland Kitchen ENSURE (ENSURE) Take 237 mLs by mouth. With meals. Vanilla    . ferrous sulfate 325 (65 FE) MG tablet Take 325 mg by mouth daily with breakfast.    . HYDROcodone-acetaminophen (NORCO/VICODIN) 5-325 MG tablet Take 1 tablet by mouth every 6 (six) hours as needed for moderate pain. 12 tablet 0  . ibuprofen (ADVIL,MOTRIN) 200 MG tablet Take 200 mg by mouth every 12 (twelve) hours as needed.    Marland Kitchen levothyroxine (SYNTHROID) 75 MCG tablet Take 75 mcg by mouth daily before breakfast.     . magnesium oxide (MAG-OX) 400 (241.3 Mg) MG tablet Take 1 tablet (400 mg total) by mouth 2 (two) times daily. 28 tablet 0  . midodrine (PROAMATINE) 2.5 MG tablet Take 1 tablet (2.5 mg total) by mouth 3 (three) times daily with meals.    . Multiple Vitamin (MULTIVITAMIN) tablet Take 1 tablet by mouth daily.    . Nutritional Supplements (NUTRITIONAL SUPPLEMENT PO) Regular Diet - Regular Texture    . Nutritional Supplements (PROMOD) LIQD Protein Liquid - give 30 ml by mouth two times daily    . omeprazole (PRILOSEC) 20 MG capsule Take 20 mg by mouth daily.    . ondansetron (ZOFRAN) 4 MG tablet Take 4 mg by mouth every 6 (six) hours as needed for nausea or vomiting.    Marland Kitchen  polyethylene glycol (MIRALAX / GLYCOLAX) packet Take 17 g by mouth daily.    . Probiotic Product (PROBIOTIC PO) Take 1 tablet by mouth daily.    . tizanidine (ZANAFLEX) 2 MG capsule Take 2 mg by mouth 3 (three) times daily.    . Vitamin D, Ergocalciferol, (DRISDOL) 50000 units CAPS capsule Take 50,000 Units by mouth every 30 (thirty) days.     No current facility-administered medications for this visit.      Past Medical History:  Diagnosis Date  . Buttock wound 03/03/2016  . Dysrhythmia    tachycardia  . MS (multiple sclerosis) ([place])   . Neutropenia ([place])   . Pneumonia 02/2016  .  Protein calorie malnutrition ([place])   . Sacral osteomyelitis ([place]) 05/19/2017  . Severe sepsis ([place]) 03/03/2016  . Spastic 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 ([place])   . UTI (urinary tract infection) 02/2016    Past Surgical History:  Procedure Laterality Date  . DIVERTING ILEOSTOMY N/A 09/03/2016   Procedure: ILEAL CONDUIT  URINARY DIVERSION OPEN;  Surgeon: Ardis Hughs, MD;  Location: [facility];  Service: Urology;  Laterality: N/A;    Social History   Socioeconomic History  . Marital status: Single    Spouse name: Not on file  . Number of children: 1  . Years of education: Not on file  . Highest education level: Not on file  Occupational History  . Occupation: Disabled  Social Needs  . Financial resource strain: Not hard at all  . Food insecurity    Worry: Never true    Inability: Never true  . Transportation needs    Medical: No    Non-medical: No  Tobacco Use  . Smoking status: Former Smoker    Packs/day: 1.00    Years: 10.00    Pack years: 10.00    Types: Cigarettes    Quit date: 02/01/2010    Years since quitting: 8.6  . Smokeless tobacco: Never Used  Substance and Sexual Activity  . Alcohol use: No  . Drug use: No  . Sexual activity: Not on file  Lifestyle  . Physical activity    Days per week: 0 days    Minutes per session: 0 min  . Stress: Not at all  Relationships  . Social connections    Talks on phone: More than three times a week    Gets together: Once a week    Attends religious service: Never    Active member of club or organization: No    Attends meetings of clubs or organizations: Never    Relationship status: Never married  . Intimate partner violence    Fear of current or ex partner: No    Emotionally abused: No    Physically abused: No    Forced sexual activity: No  Other Topics Concern  . Not on file  Social History Narrative   She is a resident at [facility].   Right-handed.    Family History  Problem Relation  Age of Onset  . Diabetes Mellitus I Mother   . Mental illness Sister     ROS: Inability to walk due to spastic paraplegia but no fevers or chills, productive cough, hemoptysis, dysphasia, odynophagia, melena, hematochezia, dysuria, hematuria, rash, seizure activity, orthopnea, PND, pedal edema, claudication. Remaining systems are negative.  Physical Exam: Answers questions appropriately Normal affect No acute distress Remainder physical examination not performed (coronavirus pandemic)  A/P  1 tachycardia-based on report patient had sinus tachycardia.  This occurred in  the setting of pain from a decubitus ulcer and her TSH was also low.  Levothyroxine dose was adjusted.  She now states her heart rate is in the 70s.  She is not symptomatic including no dyspnea, chest pain, palpitations or syncope.  Previous echocardiogram showed normal LV function.  I have asked for her electrocardiograms to be faxed to our office (both the ECG back in May and the recent one demonstrating sinus rhythm with heart rate in the 70s).  If sinus tachycardia I do not think further cardiac work-up indicated.  Typically in treating sinus tachycardia the underlying cause must be corrected such as any underlying infection/fever, anemia, overactive thyroid, dehydration etc.  Note approximately 20 minutes spent reviewing previous records prior to visiting with patient.  2 multiple sclerosis-management per neurology.  Kirk Ruths, MD

## 2018-09-27 ENCOUNTER — Ambulatory Visit: Payer: Self-pay | Admitting: Neurology

## 2018-09-27 DIAGNOSIS — M6249 Contracture of muscle, multiple sites: Secondary | ICD-10-CM | POA: Diagnosis not present

## 2018-09-27 DIAGNOSIS — G35 Multiple sclerosis: Secondary | ICD-10-CM | POA: Diagnosis not present

## 2018-09-27 DIAGNOSIS — R293 Abnormal posture: Secondary | ICD-10-CM | POA: Diagnosis not present

## 2018-09-27 DIAGNOSIS — G114 Hereditary spastic paraplegia: Secondary | ICD-10-CM | POA: Diagnosis not present

## 2018-09-27 DIAGNOSIS — E43 Unspecified severe protein-calorie malnutrition: Secondary | ICD-10-CM | POA: Diagnosis not present

## 2018-09-28 ENCOUNTER — Telehealth (INDEPENDENT_AMBULATORY_CARE_PROVIDER_SITE_OTHER): Payer: Medicare Other | Admitting: Cardiology

## 2018-09-28 ENCOUNTER — Encounter: Payer: Self-pay | Admitting: Cardiology

## 2018-09-28 VITALS — BP 116/76 | HR 76 | Ht 59.0 in | Wt 95.0 lb

## 2018-09-28 DIAGNOSIS — E43 Unspecified severe protein-calorie malnutrition: Secondary | ICD-10-CM | POA: Diagnosis not present

## 2018-09-28 DIAGNOSIS — R293 Abnormal posture: Secondary | ICD-10-CM | POA: Diagnosis not present

## 2018-09-28 DIAGNOSIS — G35 Multiple sclerosis: Secondary | ICD-10-CM | POA: Diagnosis not present

## 2018-09-28 DIAGNOSIS — R000 Tachycardia, unspecified: Secondary | ICD-10-CM | POA: Diagnosis not present

## 2018-09-28 DIAGNOSIS — G114 Hereditary spastic paraplegia: Secondary | ICD-10-CM | POA: Diagnosis not present

## 2018-09-28 DIAGNOSIS — M6249 Contracture of muscle, multiple sites: Secondary | ICD-10-CM | POA: Diagnosis not present

## 2018-09-28 NOTE — Patient Instructions (Signed)
Medication Instructions:  NO CHANGE If you need a refill on your cardiac medications before your next appointment, please call your pharmacy.   Lab work: If you have labs (blood work) drawn today and your tests are completely normal, you will receive your results only by: Marland Kitchen MyChart Message (if you have MyChart) OR . A paper copy in the mail If you have any lab test that is abnormal or we need to change your treatment, we will call you to review the results  Follow-Up: At [facility], you and your health needs are our priority.  As part of our continuing mission to provide you with exceptional heart care, we have created designated Provider Care Teams.  These Care Teams include your primary Cardiologist (physician) and Advanced Practice Providers (APPs -  Physician Assistants and Nurse Practitioners) who all work together to provide you with the care you need, when you need it. You will need a follow up appointment in AS NEEDED.  Please call our office 2 months in advance to schedule this appointment.  You may see Kirk Ruths MD or one of the following Advanced Practice Providers on your designated Care Team:   Kerin Ransom, PA-C Roby Lofts, [place] . Sande Rives, PA-C

## 2018-09-30 DIAGNOSIS — F4321 Adjustment disorder with depressed mood: Secondary | ICD-10-CM | POA: Diagnosis not present

## 2018-10-02 DIAGNOSIS — G35 Multiple sclerosis: Secondary | ICD-10-CM | POA: Diagnosis not present

## 2018-10-02 DIAGNOSIS — R293 Abnormal posture: Secondary | ICD-10-CM | POA: Diagnosis not present

## 2018-10-02 DIAGNOSIS — G114 Hereditary spastic paraplegia: Secondary | ICD-10-CM | POA: Diagnosis not present

## 2018-10-02 DIAGNOSIS — E43 Unspecified severe protein-calorie malnutrition: Secondary | ICD-10-CM | POA: Diagnosis not present

## 2018-10-02 DIAGNOSIS — M6249 Contracture of muscle, multiple sites: Secondary | ICD-10-CM | POA: Diagnosis not present

## 2018-10-03 DIAGNOSIS — G35 Multiple sclerosis: Secondary | ICD-10-CM | POA: Diagnosis not present

## 2018-10-03 DIAGNOSIS — M6249 Contracture of muscle, multiple sites: Secondary | ICD-10-CM | POA: Diagnosis not present

## 2018-10-03 DIAGNOSIS — G114 Hereditary spastic paraplegia: Secondary | ICD-10-CM | POA: Diagnosis not present

## 2018-10-03 DIAGNOSIS — R293 Abnormal posture: Secondary | ICD-10-CM | POA: Diagnosis not present

## 2018-10-03 DIAGNOSIS — E43 Unspecified severe protein-calorie malnutrition: Secondary | ICD-10-CM | POA: Diagnosis not present

## 2018-10-04 DIAGNOSIS — M6249 Contracture of muscle, multiple sites: Secondary | ICD-10-CM | POA: Diagnosis not present

## 2018-10-04 DIAGNOSIS — D508 Other iron deficiency anemias: Secondary | ICD-10-CM | POA: Diagnosis not present

## 2018-10-04 DIAGNOSIS — G35 Multiple sclerosis: Secondary | ICD-10-CM | POA: Diagnosis not present

## 2018-10-04 DIAGNOSIS — M62838 Other muscle spasm: Secondary | ICD-10-CM | POA: Diagnosis not present

## 2018-10-04 DIAGNOSIS — G114 Hereditary spastic paraplegia: Secondary | ICD-10-CM | POA: Diagnosis not present

## 2018-10-04 DIAGNOSIS — R5381 Other malaise: Secondary | ICD-10-CM | POA: Diagnosis not present

## 2018-10-04 DIAGNOSIS — R293 Abnormal posture: Secondary | ICD-10-CM | POA: Diagnosis not present

## 2018-10-04 DIAGNOSIS — E43 Unspecified severe protein-calorie malnutrition: Secondary | ICD-10-CM | POA: Diagnosis not present

## 2018-10-05 DIAGNOSIS — G35 Multiple sclerosis: Secondary | ICD-10-CM | POA: Diagnosis not present

## 2018-10-05 DIAGNOSIS — M6249 Contracture of muscle, multiple sites: Secondary | ICD-10-CM | POA: Diagnosis not present

## 2018-10-05 DIAGNOSIS — G114 Hereditary spastic paraplegia: Secondary | ICD-10-CM | POA: Diagnosis not present

## 2018-10-05 DIAGNOSIS — R293 Abnormal posture: Secondary | ICD-10-CM | POA: Diagnosis not present

## 2018-10-05 DIAGNOSIS — E43 Unspecified severe protein-calorie malnutrition: Secondary | ICD-10-CM | POA: Diagnosis not present

## 2018-10-08 DIAGNOSIS — M6249 Contracture of muscle, multiple sites: Secondary | ICD-10-CM | POA: Diagnosis not present

## 2018-10-08 DIAGNOSIS — R293 Abnormal posture: Secondary | ICD-10-CM | POA: Diagnosis not present

## 2018-10-08 DIAGNOSIS — E43 Unspecified severe protein-calorie malnutrition: Secondary | ICD-10-CM | POA: Diagnosis not present

## 2018-10-08 DIAGNOSIS — G35 Multiple sclerosis: Secondary | ICD-10-CM | POA: Diagnosis not present

## 2018-10-08 DIAGNOSIS — G114 Hereditary spastic paraplegia: Secondary | ICD-10-CM | POA: Diagnosis not present

## 2018-10-09 DIAGNOSIS — M6249 Contracture of muscle, multiple sites: Secondary | ICD-10-CM | POA: Diagnosis not present

## 2018-10-09 DIAGNOSIS — E43 Unspecified severe protein-calorie malnutrition: Secondary | ICD-10-CM | POA: Diagnosis not present

## 2018-10-09 DIAGNOSIS — G35 Multiple sclerosis: Secondary | ICD-10-CM | POA: Diagnosis not present

## 2018-10-09 DIAGNOSIS — R293 Abnormal posture: Secondary | ICD-10-CM | POA: Diagnosis not present

## 2018-10-09 DIAGNOSIS — G114 Hereditary spastic paraplegia: Secondary | ICD-10-CM | POA: Diagnosis not present

## 2018-10-10 DIAGNOSIS — G35 Multiple sclerosis: Secondary | ICD-10-CM | POA: Diagnosis not present

## 2018-10-10 DIAGNOSIS — G114 Hereditary spastic paraplegia: Secondary | ICD-10-CM | POA: Diagnosis not present

## 2018-10-10 DIAGNOSIS — R293 Abnormal posture: Secondary | ICD-10-CM | POA: Diagnosis not present

## 2018-10-10 DIAGNOSIS — M6249 Contracture of muscle, multiple sites: Secondary | ICD-10-CM | POA: Diagnosis not present

## 2018-10-10 DIAGNOSIS — E43 Unspecified severe protein-calorie malnutrition: Secondary | ICD-10-CM | POA: Diagnosis not present

## 2018-10-11 DIAGNOSIS — G35 Multiple sclerosis: Secondary | ICD-10-CM | POA: Diagnosis not present

## 2018-10-11 DIAGNOSIS — G114 Hereditary spastic paraplegia: Secondary | ICD-10-CM | POA: Diagnosis not present

## 2018-10-11 DIAGNOSIS — M6249 Contracture of muscle, multiple sites: Secondary | ICD-10-CM | POA: Diagnosis not present

## 2018-10-11 DIAGNOSIS — E43 Unspecified severe protein-calorie malnutrition: Secondary | ICD-10-CM | POA: Diagnosis not present

## 2018-10-11 DIAGNOSIS — R293 Abnormal posture: Secondary | ICD-10-CM | POA: Diagnosis not present

## 2018-10-12 DIAGNOSIS — R5381 Other malaise: Secondary | ICD-10-CM | POA: Diagnosis not present

## 2018-10-12 DIAGNOSIS — G35 Multiple sclerosis: Secondary | ICD-10-CM | POA: Diagnosis not present

## 2018-10-12 DIAGNOSIS — G114 Hereditary spastic paraplegia: Secondary | ICD-10-CM | POA: Diagnosis not present

## 2018-10-12 DIAGNOSIS — M6249 Contracture of muscle, multiple sites: Secondary | ICD-10-CM | POA: Diagnosis not present

## 2018-10-12 DIAGNOSIS — D508 Other iron deficiency anemias: Secondary | ICD-10-CM | POA: Diagnosis not present

## 2018-10-12 DIAGNOSIS — E43 Unspecified severe protein-calorie malnutrition: Secondary | ICD-10-CM | POA: Diagnosis not present

## 2018-10-12 DIAGNOSIS — R293 Abnormal posture: Secondary | ICD-10-CM | POA: Diagnosis not present

## 2018-10-12 DIAGNOSIS — M62838 Other muscle spasm: Secondary | ICD-10-CM | POA: Diagnosis not present

## 2018-10-15 DIAGNOSIS — R293 Abnormal posture: Secondary | ICD-10-CM | POA: Diagnosis not present

## 2018-10-15 DIAGNOSIS — G35 Multiple sclerosis: Secondary | ICD-10-CM | POA: Diagnosis not present

## 2018-10-15 DIAGNOSIS — M6249 Contracture of muscle, multiple sites: Secondary | ICD-10-CM | POA: Diagnosis not present

## 2018-10-15 DIAGNOSIS — G114 Hereditary spastic paraplegia: Secondary | ICD-10-CM | POA: Diagnosis not present

## 2018-10-15 DIAGNOSIS — E43 Unspecified severe protein-calorie malnutrition: Secondary | ICD-10-CM | POA: Diagnosis not present

## 2018-10-16 DIAGNOSIS — G114 Hereditary spastic paraplegia: Secondary | ICD-10-CM | POA: Diagnosis not present

## 2018-10-16 DIAGNOSIS — M6249 Contracture of muscle, multiple sites: Secondary | ICD-10-CM | POA: Diagnosis not present

## 2018-10-16 DIAGNOSIS — F4321 Adjustment disorder with depressed mood: Secondary | ICD-10-CM | POA: Diagnosis not present

## 2018-10-16 DIAGNOSIS — R293 Abnormal posture: Secondary | ICD-10-CM | POA: Diagnosis not present

## 2018-10-16 DIAGNOSIS — E43 Unspecified severe protein-calorie malnutrition: Secondary | ICD-10-CM | POA: Diagnosis not present

## 2018-10-16 DIAGNOSIS — G35 Multiple sclerosis: Secondary | ICD-10-CM | POA: Diagnosis not present

## 2018-10-17 DIAGNOSIS — R293 Abnormal posture: Secondary | ICD-10-CM | POA: Diagnosis not present

## 2018-10-17 DIAGNOSIS — G114 Hereditary spastic paraplegia: Secondary | ICD-10-CM | POA: Diagnosis not present

## 2018-10-17 DIAGNOSIS — G35 Multiple sclerosis: Secondary | ICD-10-CM | POA: Diagnosis not present

## 2018-10-17 DIAGNOSIS — M6249 Contracture of muscle, multiple sites: Secondary | ICD-10-CM | POA: Diagnosis not present

## 2018-10-17 DIAGNOSIS — E43 Unspecified severe protein-calorie malnutrition: Secondary | ICD-10-CM | POA: Diagnosis not present

## 2018-10-17 IMAGING — CT CT ANGIO CHEST
2 of 6 series · 18 of 46 positions shown · IV contrast (ISOVUE 370)
Comparison: Chest CT April 07, 2016; chest radiograph August 31, 2016

CLINICAL DATA: Fever with positive D-dimer examination. Multiple
sclerosis

EXAM:
CT ANGIOGRAPHY CHEST WITH CONTRAST
TECHNIQUE: Multidetector CT imaging of the chest was performed using the
standard protocol during bolus administration of intravenous
contrast. Multiplanar CT image reconstructions and MIPs were
obtained to evaluate the vascular anatomy.
CONTRAST:  100 mL Isovue 370 nonionic

[Series 6: thins · axial · 0.61mm/px · z∈[-1006,-760]mm · 16 of 270 slices shown]
[im 12/270  lung]
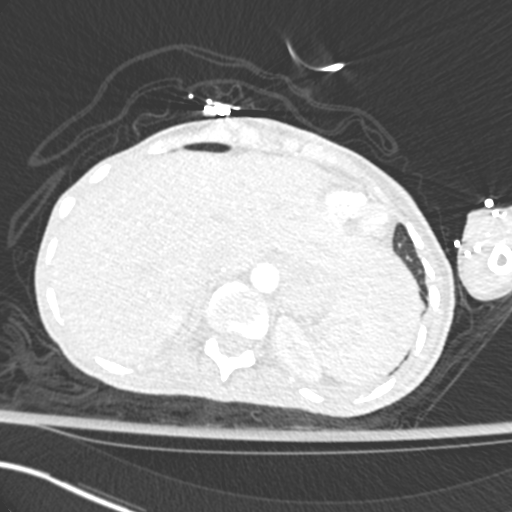
[im 36/270  soft-tissue]
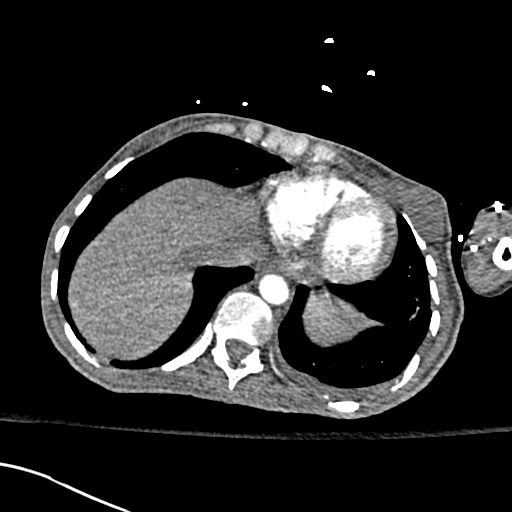
[im 47/270  lung]
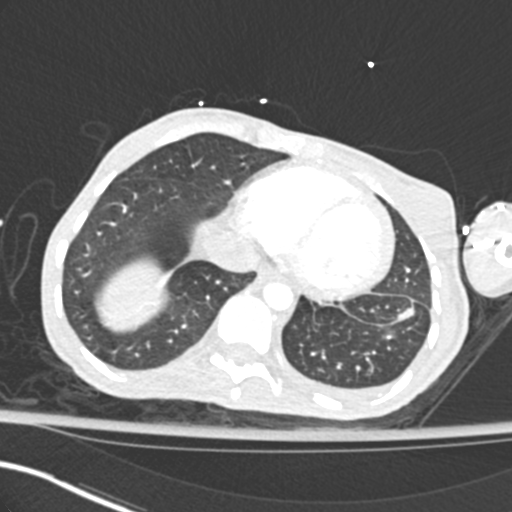
[im 59/270  soft-tissue]
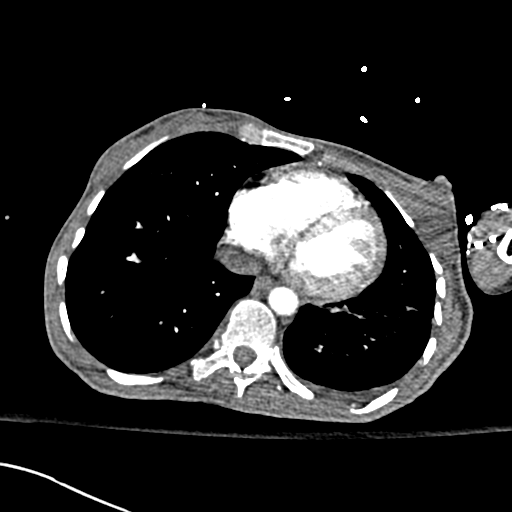
[im 82/270  lung]
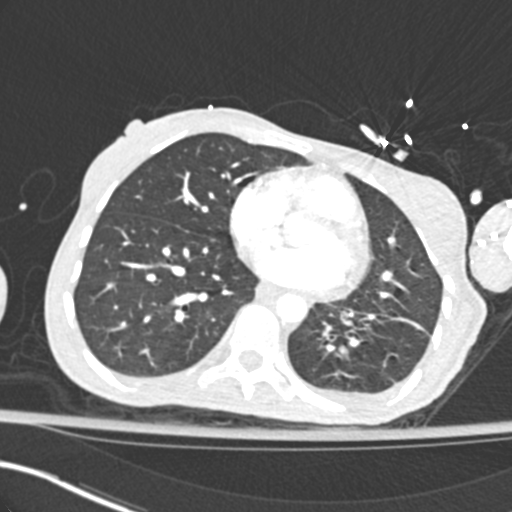
[im 94/270  soft-tissue]
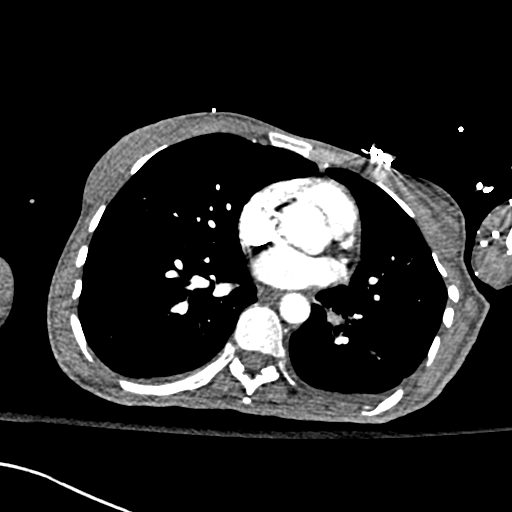
[im 106/270  lung]
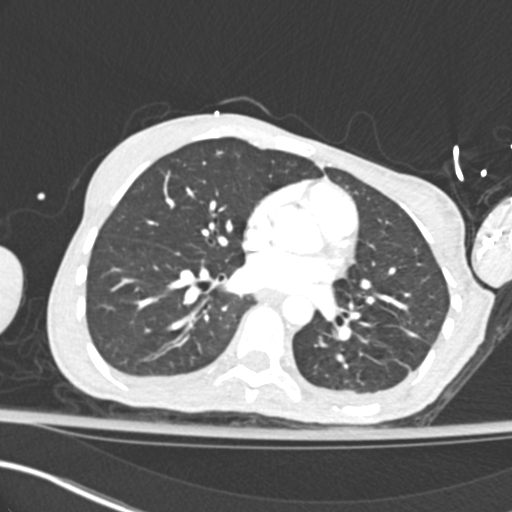
[im 129/270  soft-tissue]
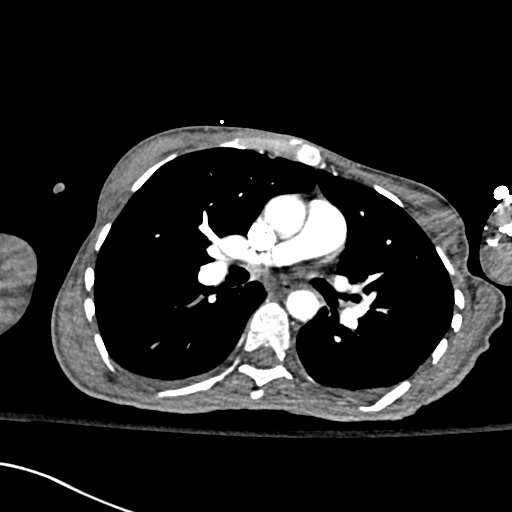
[im 141/270  lung]
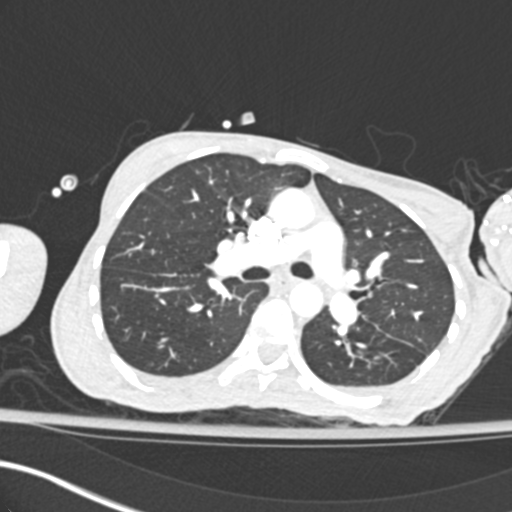
[im 164/270  soft-tissue]
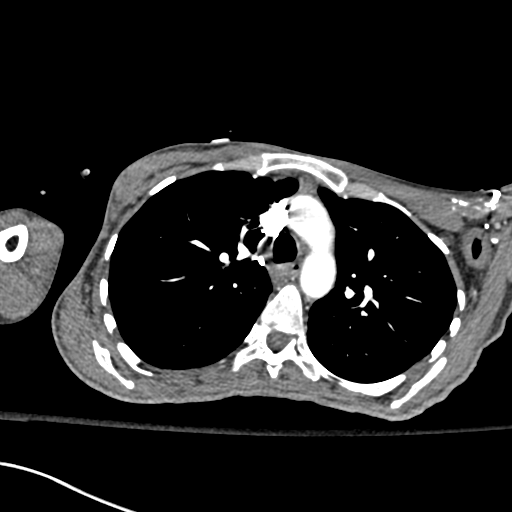
[im 176/270  lung]
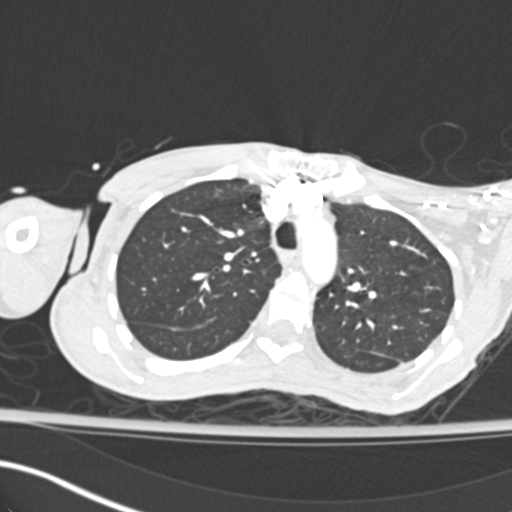
[im 188/270  soft-tissue]
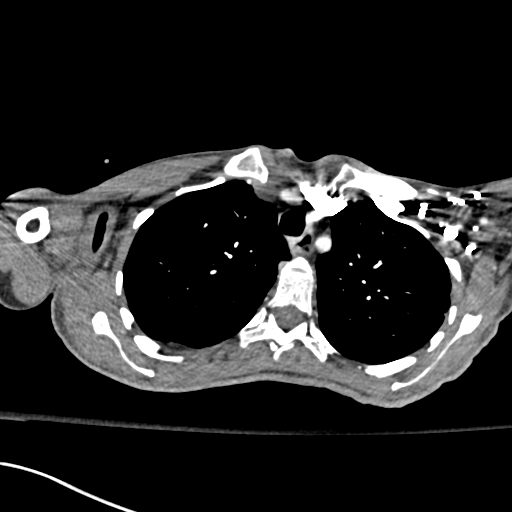
[im 211/270  lung]
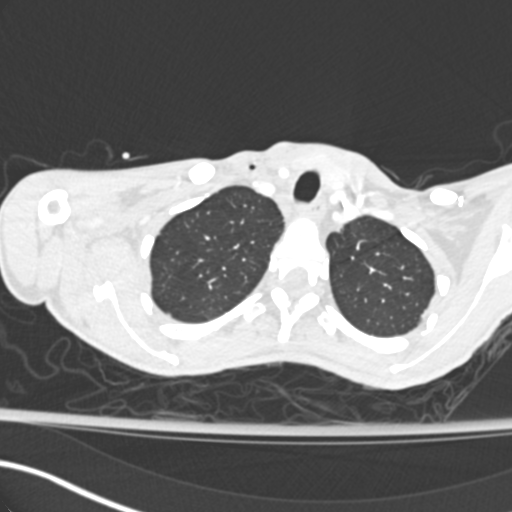
[im 223/270  soft-tissue]
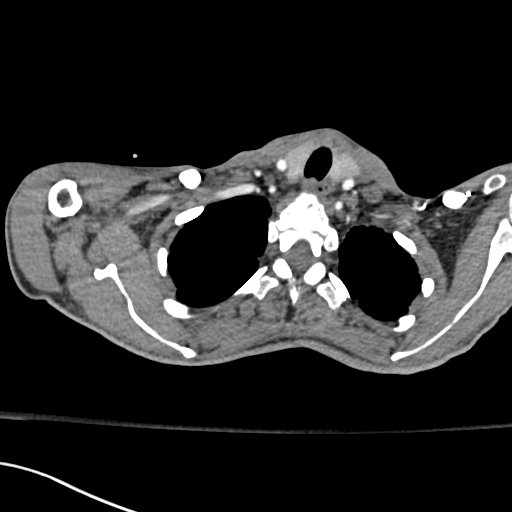
[im 234/270  lung]
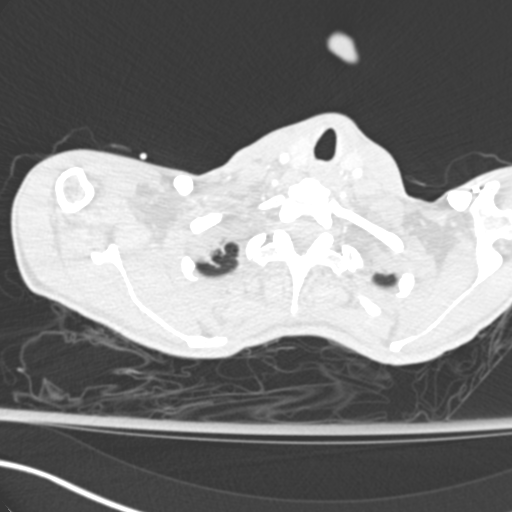
[im 258/270  soft-tissue]
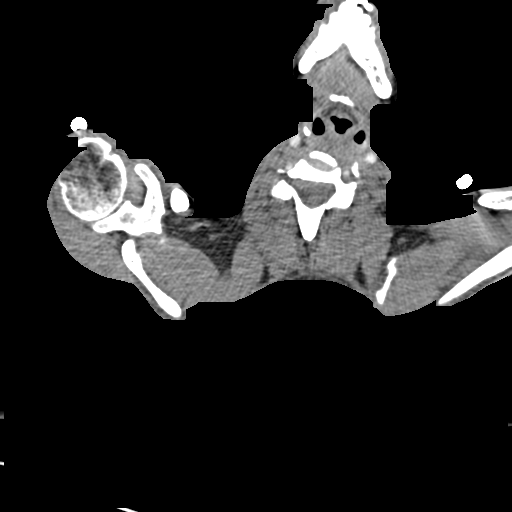

[Series 7: coronal mpr · coronal · 0.53mm/px · 2 of 66 slices shown]
[im 22/66  soft-tissue]
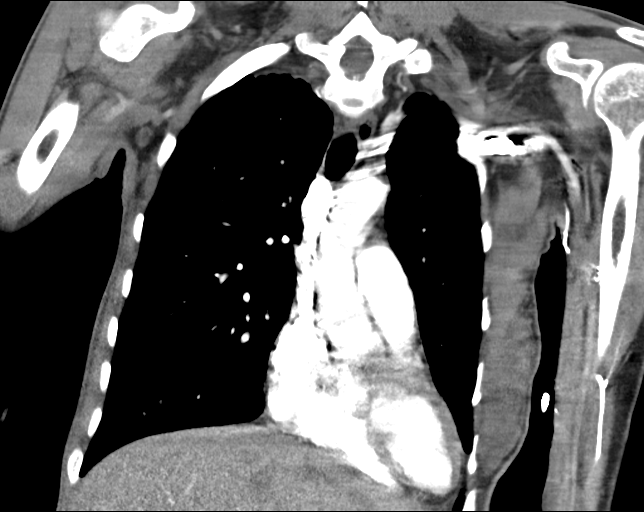
[im 44/66  soft-tissue]
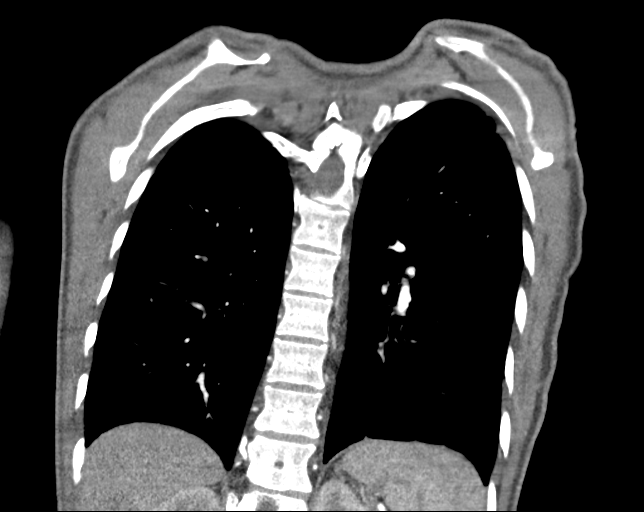

[18 of 46 positions shown; findings below may reference images not displayed]

FINDINGS: Cardiovascular: There is no demonstrable pulmonary embolus. There is
no thoracic aortic aneurysm or dissection. The visualized great
vessels appear unremarkable. There is no appreciable pericardial
thickening.

Mediastinum/Nodes: Thyroid appears unremarkable. There is no evident
thoracic adenopathy.

Lungs/Pleura: There is scarring in the extreme lung apices. There is
mild central peribronchial thickening. There is a small pleural
effusion on each side. There is no edema or consolidation. There is
slight there is atelectatic change in the lateral and posterior left
base regions. There is suspected early pneumonia in the lateral left
base.

Upper Abdomen: Visualized upper abdominal structures appear
unremarkable.

Musculoskeletal: There are no blastic or lytic bone lesions.

Review of the MIP images confirms the above findings.
IMPRESSION: 1.  No demonstrable pulmonary embolus.

2.  Atelectasis with probable early pneumonia lateral left base.

3. Mild central peribronchial thickening. Suspect chronic
bronchitis.

4.  Small pleural effusions bilaterally.

5.  No demonstrable adenopathy.

## 2018-10-18 DIAGNOSIS — G35 Multiple sclerosis: Secondary | ICD-10-CM | POA: Diagnosis not present

## 2018-10-18 DIAGNOSIS — M6249 Contracture of muscle, multiple sites: Secondary | ICD-10-CM | POA: Diagnosis not present

## 2018-10-18 DIAGNOSIS — G114 Hereditary spastic paraplegia: Secondary | ICD-10-CM | POA: Diagnosis not present

## 2018-10-18 DIAGNOSIS — E43 Unspecified severe protein-calorie malnutrition: Secondary | ICD-10-CM | POA: Diagnosis not present

## 2018-10-18 DIAGNOSIS — R293 Abnormal posture: Secondary | ICD-10-CM | POA: Diagnosis not present

## 2018-10-19 DIAGNOSIS — M6249 Contracture of muscle, multiple sites: Secondary | ICD-10-CM | POA: Diagnosis not present

## 2018-10-19 DIAGNOSIS — G35 Multiple sclerosis: Secondary | ICD-10-CM | POA: Diagnosis not present

## 2018-10-19 DIAGNOSIS — E43 Unspecified severe protein-calorie malnutrition: Secondary | ICD-10-CM | POA: Diagnosis not present

## 2018-10-19 DIAGNOSIS — R293 Abnormal posture: Secondary | ICD-10-CM | POA: Diagnosis not present

## 2018-10-19 DIAGNOSIS — G114 Hereditary spastic paraplegia: Secondary | ICD-10-CM | POA: Diagnosis not present

## 2018-10-22 DIAGNOSIS — R293 Abnormal posture: Secondary | ICD-10-CM | POA: Diagnosis not present

## 2018-10-22 DIAGNOSIS — G114 Hereditary spastic paraplegia: Secondary | ICD-10-CM | POA: Diagnosis not present

## 2018-10-22 DIAGNOSIS — G35 Multiple sclerosis: Secondary | ICD-10-CM | POA: Diagnosis not present

## 2018-10-22 DIAGNOSIS — E43 Unspecified severe protein-calorie malnutrition: Secondary | ICD-10-CM | POA: Diagnosis not present

## 2018-10-22 DIAGNOSIS — M6249 Contracture of muscle, multiple sites: Secondary | ICD-10-CM | POA: Diagnosis not present

## 2018-10-23 DIAGNOSIS — M6249 Contracture of muscle, multiple sites: Secondary | ICD-10-CM | POA: Diagnosis not present

## 2018-10-23 DIAGNOSIS — G35 Multiple sclerosis: Secondary | ICD-10-CM | POA: Diagnosis not present

## 2018-10-23 DIAGNOSIS — E43 Unspecified severe protein-calorie malnutrition: Secondary | ICD-10-CM | POA: Diagnosis not present

## 2018-10-23 DIAGNOSIS — R293 Abnormal posture: Secondary | ICD-10-CM | POA: Diagnosis not present

## 2018-10-23 DIAGNOSIS — Z20828 Contact with and (suspected) exposure to other viral communicable diseases: Secondary | ICD-10-CM | POA: Diagnosis not present

## 2018-10-23 DIAGNOSIS — G114 Hereditary spastic paraplegia: Secondary | ICD-10-CM | POA: Diagnosis not present

## 2018-10-24 DIAGNOSIS — R293 Abnormal posture: Secondary | ICD-10-CM | POA: Diagnosis not present

## 2018-10-24 DIAGNOSIS — G35 Multiple sclerosis: Secondary | ICD-10-CM | POA: Diagnosis not present

## 2018-10-24 DIAGNOSIS — E43 Unspecified severe protein-calorie malnutrition: Secondary | ICD-10-CM | POA: Diagnosis not present

## 2018-10-24 DIAGNOSIS — M6249 Contracture of muscle, multiple sites: Secondary | ICD-10-CM | POA: Diagnosis not present

## 2018-10-24 DIAGNOSIS — G114 Hereditary spastic paraplegia: Secondary | ICD-10-CM | POA: Diagnosis not present

## 2018-10-25 DIAGNOSIS — M6249 Contracture of muscle, multiple sites: Secondary | ICD-10-CM | POA: Diagnosis not present

## 2018-10-25 DIAGNOSIS — E039 Hypothyroidism, unspecified: Secondary | ICD-10-CM | POA: Diagnosis not present

## 2018-10-25 DIAGNOSIS — D509 Iron deficiency anemia, unspecified: Secondary | ICD-10-CM | POA: Diagnosis not present

## 2018-10-25 DIAGNOSIS — E43 Unspecified severe protein-calorie malnutrition: Secondary | ICD-10-CM | POA: Diagnosis not present

## 2018-10-25 DIAGNOSIS — G114 Hereditary spastic paraplegia: Secondary | ICD-10-CM | POA: Diagnosis not present

## 2018-10-25 DIAGNOSIS — G35 Multiple sclerosis: Secondary | ICD-10-CM | POA: Diagnosis not present

## 2018-10-25 DIAGNOSIS — R293 Abnormal posture: Secondary | ICD-10-CM | POA: Diagnosis not present

## 2018-10-26 DIAGNOSIS — E43 Unspecified severe protein-calorie malnutrition: Secondary | ICD-10-CM | POA: Diagnosis not present

## 2018-10-26 DIAGNOSIS — M6249 Contracture of muscle, multiple sites: Secondary | ICD-10-CM | POA: Diagnosis not present

## 2018-10-26 DIAGNOSIS — G35 Multiple sclerosis: Secondary | ICD-10-CM | POA: Diagnosis not present

## 2018-10-26 DIAGNOSIS — R293 Abnormal posture: Secondary | ICD-10-CM | POA: Diagnosis not present

## 2018-10-26 DIAGNOSIS — G114 Hereditary spastic paraplegia: Secondary | ICD-10-CM | POA: Diagnosis not present

## 2018-10-27 DIAGNOSIS — F332 Major depressive disorder, recurrent severe without psychotic features: Secondary | ICD-10-CM | POA: Diagnosis not present

## 2018-10-27 DIAGNOSIS — E038 Other specified hypothyroidism: Secondary | ICD-10-CM | POA: Diagnosis not present

## 2018-10-28 DIAGNOSIS — M6249 Contracture of muscle, multiple sites: Secondary | ICD-10-CM | POA: Diagnosis not present

## 2018-10-28 DIAGNOSIS — G114 Hereditary spastic paraplegia: Secondary | ICD-10-CM | POA: Diagnosis not present

## 2018-10-28 DIAGNOSIS — G35 Multiple sclerosis: Secondary | ICD-10-CM | POA: Diagnosis not present

## 2018-10-28 DIAGNOSIS — E43 Unspecified severe protein-calorie malnutrition: Secondary | ICD-10-CM | POA: Diagnosis not present

## 2018-10-28 DIAGNOSIS — R293 Abnormal posture: Secondary | ICD-10-CM | POA: Diagnosis not present

## 2018-10-30 DIAGNOSIS — G114 Hereditary spastic paraplegia: Secondary | ICD-10-CM | POA: Diagnosis not present

## 2018-10-30 DIAGNOSIS — E43 Unspecified severe protein-calorie malnutrition: Secondary | ICD-10-CM | POA: Diagnosis not present

## 2018-10-30 DIAGNOSIS — G35 Multiple sclerosis: Secondary | ICD-10-CM | POA: Diagnosis not present

## 2018-10-30 DIAGNOSIS — R293 Abnormal posture: Secondary | ICD-10-CM | POA: Diagnosis not present

## 2018-10-30 DIAGNOSIS — M6249 Contracture of muscle, multiple sites: Secondary | ICD-10-CM | POA: Diagnosis not present

## 2018-10-31 DIAGNOSIS — G114 Hereditary spastic paraplegia: Secondary | ICD-10-CM | POA: Diagnosis not present

## 2018-10-31 DIAGNOSIS — D508 Other iron deficiency anemias: Secondary | ICD-10-CM | POA: Diagnosis not present

## 2018-10-31 DIAGNOSIS — E43 Unspecified severe protein-calorie malnutrition: Secondary | ICD-10-CM | POA: Diagnosis not present

## 2018-10-31 DIAGNOSIS — M62838 Other muscle spasm: Secondary | ICD-10-CM | POA: Diagnosis not present

## 2018-10-31 DIAGNOSIS — R293 Abnormal posture: Secondary | ICD-10-CM | POA: Diagnosis not present

## 2018-10-31 DIAGNOSIS — G35 Multiple sclerosis: Secondary | ICD-10-CM | POA: Diagnosis not present

## 2018-10-31 DIAGNOSIS — R000 Tachycardia, unspecified: Secondary | ICD-10-CM | POA: Diagnosis not present

## 2018-10-31 DIAGNOSIS — M6249 Contracture of muscle, multiple sites: Secondary | ICD-10-CM | POA: Diagnosis not present

## 2018-11-01 DIAGNOSIS — M6249 Contracture of muscle, multiple sites: Secondary | ICD-10-CM | POA: Diagnosis not present

## 2018-11-01 DIAGNOSIS — G114 Hereditary spastic paraplegia: Secondary | ICD-10-CM | POA: Diagnosis not present

## 2018-11-01 DIAGNOSIS — G35 Multiple sclerosis: Secondary | ICD-10-CM | POA: Diagnosis not present

## 2018-11-01 DIAGNOSIS — E43 Unspecified severe protein-calorie malnutrition: Secondary | ICD-10-CM | POA: Diagnosis not present

## 2018-11-01 DIAGNOSIS — R293 Abnormal posture: Secondary | ICD-10-CM | POA: Diagnosis not present

## 2018-11-02 DIAGNOSIS — R293 Abnormal posture: Secondary | ICD-10-CM | POA: Diagnosis not present

## 2018-11-02 DIAGNOSIS — E43 Unspecified severe protein-calorie malnutrition: Secondary | ICD-10-CM | POA: Diagnosis not present

## 2018-11-02 DIAGNOSIS — G35 Multiple sclerosis: Secondary | ICD-10-CM | POA: Diagnosis not present

## 2018-11-02 DIAGNOSIS — G114 Hereditary spastic paraplegia: Secondary | ICD-10-CM | POA: Diagnosis not present

## 2018-11-02 DIAGNOSIS — M6249 Contracture of muscle, multiple sites: Secondary | ICD-10-CM | POA: Diagnosis not present

## 2018-11-02 DIAGNOSIS — Z23 Encounter for immunization: Secondary | ICD-10-CM | POA: Diagnosis not present

## 2018-11-04 DIAGNOSIS — G114 Hereditary spastic paraplegia: Secondary | ICD-10-CM | POA: Diagnosis not present

## 2018-11-04 DIAGNOSIS — R293 Abnormal posture: Secondary | ICD-10-CM | POA: Diagnosis not present

## 2018-11-04 DIAGNOSIS — Z23 Encounter for immunization: Secondary | ICD-10-CM | POA: Diagnosis not present

## 2018-11-04 DIAGNOSIS — E43 Unspecified severe protein-calorie malnutrition: Secondary | ICD-10-CM | POA: Diagnosis not present

## 2018-11-04 DIAGNOSIS — M6249 Contracture of muscle, multiple sites: Secondary | ICD-10-CM | POA: Diagnosis not present

## 2018-11-04 DIAGNOSIS — G35 Multiple sclerosis: Secondary | ICD-10-CM | POA: Diagnosis not present

## 2018-11-05 DIAGNOSIS — R293 Abnormal posture: Secondary | ICD-10-CM | POA: Diagnosis not present

## 2018-11-05 DIAGNOSIS — G114 Hereditary spastic paraplegia: Secondary | ICD-10-CM | POA: Diagnosis not present

## 2018-11-05 DIAGNOSIS — E43 Unspecified severe protein-calorie malnutrition: Secondary | ICD-10-CM | POA: Diagnosis not present

## 2018-11-05 DIAGNOSIS — M6249 Contracture of muscle, multiple sites: Secondary | ICD-10-CM | POA: Diagnosis not present

## 2018-11-05 DIAGNOSIS — Z23 Encounter for immunization: Secondary | ICD-10-CM | POA: Diagnosis not present

## 2018-11-05 DIAGNOSIS — G35 Multiple sclerosis: Secondary | ICD-10-CM | POA: Diagnosis not present

## 2018-11-06 DIAGNOSIS — G114 Hereditary spastic paraplegia: Secondary | ICD-10-CM | POA: Diagnosis not present

## 2018-11-06 DIAGNOSIS — Z23 Encounter for immunization: Secondary | ICD-10-CM | POA: Diagnosis not present

## 2018-11-06 DIAGNOSIS — E43 Unspecified severe protein-calorie malnutrition: Secondary | ICD-10-CM | POA: Diagnosis not present

## 2018-11-06 DIAGNOSIS — M6249 Contracture of muscle, multiple sites: Secondary | ICD-10-CM | POA: Diagnosis not present

## 2018-11-06 DIAGNOSIS — R293 Abnormal posture: Secondary | ICD-10-CM | POA: Diagnosis not present

## 2018-11-06 DIAGNOSIS — G35 Multiple sclerosis: Secondary | ICD-10-CM | POA: Diagnosis not present

## 2018-11-08 DIAGNOSIS — M319 Necrotizing vasculopathy, unspecified: Secondary | ICD-10-CM | POA: Diagnosis not present

## 2018-11-08 DIAGNOSIS — G35 Multiple sclerosis: Secondary | ICD-10-CM | POA: Diagnosis not present

## 2018-11-08 DIAGNOSIS — E039 Hypothyroidism, unspecified: Secondary | ICD-10-CM | POA: Diagnosis not present

## 2018-11-08 DIAGNOSIS — E038 Other specified hypothyroidism: Secondary | ICD-10-CM | POA: Diagnosis not present

## 2018-11-09 DIAGNOSIS — D508 Other iron deficiency anemias: Secondary | ICD-10-CM | POA: Diagnosis not present

## 2018-11-09 DIAGNOSIS — G35 Multiple sclerosis: Secondary | ICD-10-CM | POA: Diagnosis not present

## 2018-11-09 DIAGNOSIS — R5381 Other malaise: Secondary | ICD-10-CM | POA: Diagnosis not present

## 2018-11-09 DIAGNOSIS — M6249 Contracture of muscle, multiple sites: Secondary | ICD-10-CM | POA: Diagnosis not present

## 2018-11-09 DIAGNOSIS — H2513 Age-related nuclear cataract, bilateral: Secondary | ICD-10-CM | POA: Diagnosis not present

## 2018-11-09 DIAGNOSIS — H47213 Primary optic atrophy, bilateral: Secondary | ICD-10-CM | POA: Diagnosis not present

## 2018-11-09 DIAGNOSIS — R293 Abnormal posture: Secondary | ICD-10-CM | POA: Diagnosis not present

## 2018-11-09 DIAGNOSIS — G114 Hereditary spastic paraplegia: Secondary | ICD-10-CM | POA: Diagnosis not present

## 2018-11-09 DIAGNOSIS — M62838 Other muscle spasm: Secondary | ICD-10-CM | POA: Diagnosis not present

## 2018-11-09 DIAGNOSIS — E43 Unspecified severe protein-calorie malnutrition: Secondary | ICD-10-CM | POA: Diagnosis not present

## 2018-11-09 DIAGNOSIS — Z23 Encounter for immunization: Secondary | ICD-10-CM | POA: Diagnosis not present

## 2018-11-12 DIAGNOSIS — E43 Unspecified severe protein-calorie malnutrition: Secondary | ICD-10-CM | POA: Diagnosis not present

## 2018-11-12 DIAGNOSIS — G114 Hereditary spastic paraplegia: Secondary | ICD-10-CM | POA: Diagnosis not present

## 2018-11-12 DIAGNOSIS — Z23 Encounter for immunization: Secondary | ICD-10-CM | POA: Diagnosis not present

## 2018-11-12 DIAGNOSIS — R293 Abnormal posture: Secondary | ICD-10-CM | POA: Diagnosis not present

## 2018-11-12 DIAGNOSIS — G35 Multiple sclerosis: Secondary | ICD-10-CM | POA: Diagnosis not present

## 2018-11-12 DIAGNOSIS — M6249 Contracture of muscle, multiple sites: Secondary | ICD-10-CM | POA: Diagnosis not present

## 2018-11-13 DIAGNOSIS — F4321 Adjustment disorder with depressed mood: Secondary | ICD-10-CM | POA: Diagnosis not present

## 2018-11-15 DIAGNOSIS — Z23 Encounter for immunization: Secondary | ICD-10-CM | POA: Diagnosis not present

## 2018-11-15 DIAGNOSIS — G114 Hereditary spastic paraplegia: Secondary | ICD-10-CM | POA: Diagnosis not present

## 2018-11-15 DIAGNOSIS — R293 Abnormal posture: Secondary | ICD-10-CM | POA: Diagnosis not present

## 2018-11-15 DIAGNOSIS — G35 Multiple sclerosis: Secondary | ICD-10-CM | POA: Diagnosis not present

## 2018-11-15 DIAGNOSIS — E43 Unspecified severe protein-calorie malnutrition: Secondary | ICD-10-CM | POA: Diagnosis not present

## 2018-11-15 DIAGNOSIS — M6249 Contracture of muscle, multiple sites: Secondary | ICD-10-CM | POA: Diagnosis not present

## 2018-11-16 DIAGNOSIS — G114 Hereditary spastic paraplegia: Secondary | ICD-10-CM | POA: Diagnosis not present

## 2018-11-16 DIAGNOSIS — M6249 Contracture of muscle, multiple sites: Secondary | ICD-10-CM | POA: Diagnosis not present

## 2018-11-16 DIAGNOSIS — E43 Unspecified severe protein-calorie malnutrition: Secondary | ICD-10-CM | POA: Diagnosis not present

## 2018-11-16 DIAGNOSIS — R293 Abnormal posture: Secondary | ICD-10-CM | POA: Diagnosis not present

## 2018-11-16 DIAGNOSIS — Z23 Encounter for immunization: Secondary | ICD-10-CM | POA: Diagnosis not present

## 2018-11-16 DIAGNOSIS — G35 Multiple sclerosis: Secondary | ICD-10-CM | POA: Diagnosis not present

## 2018-11-19 DIAGNOSIS — R293 Abnormal posture: Secondary | ICD-10-CM | POA: Diagnosis not present

## 2018-11-19 DIAGNOSIS — G35 Multiple sclerosis: Secondary | ICD-10-CM | POA: Diagnosis not present

## 2018-11-19 DIAGNOSIS — G114 Hereditary spastic paraplegia: Secondary | ICD-10-CM | POA: Diagnosis not present

## 2018-11-19 DIAGNOSIS — E43 Unspecified severe protein-calorie malnutrition: Secondary | ICD-10-CM | POA: Diagnosis not present

## 2018-11-19 DIAGNOSIS — M6249 Contracture of muscle, multiple sites: Secondary | ICD-10-CM | POA: Diagnosis not present

## 2018-11-19 DIAGNOSIS — Z23 Encounter for immunization: Secondary | ICD-10-CM | POA: Diagnosis not present

## 2018-11-20 DIAGNOSIS — G35 Multiple sclerosis: Secondary | ICD-10-CM | POA: Diagnosis not present

## 2018-11-20 DIAGNOSIS — G114 Hereditary spastic paraplegia: Secondary | ICD-10-CM | POA: Diagnosis not present

## 2018-11-20 DIAGNOSIS — E43 Unspecified severe protein-calorie malnutrition: Secondary | ICD-10-CM | POA: Diagnosis not present

## 2018-11-20 DIAGNOSIS — Z23 Encounter for immunization: Secondary | ICD-10-CM | POA: Diagnosis not present

## 2018-11-20 DIAGNOSIS — M6249 Contracture of muscle, multiple sites: Secondary | ICD-10-CM | POA: Diagnosis not present

## 2018-11-20 DIAGNOSIS — R293 Abnormal posture: Secondary | ICD-10-CM | POA: Diagnosis not present

## 2018-11-21 DIAGNOSIS — G35 Multiple sclerosis: Secondary | ICD-10-CM | POA: Diagnosis not present

## 2018-11-21 DIAGNOSIS — R293 Abnormal posture: Secondary | ICD-10-CM | POA: Diagnosis not present

## 2018-11-21 DIAGNOSIS — M6249 Contracture of muscle, multiple sites: Secondary | ICD-10-CM | POA: Diagnosis not present

## 2018-11-21 DIAGNOSIS — G114 Hereditary spastic paraplegia: Secondary | ICD-10-CM | POA: Diagnosis not present

## 2018-11-21 DIAGNOSIS — Z23 Encounter for immunization: Secondary | ICD-10-CM | POA: Diagnosis not present

## 2018-11-21 DIAGNOSIS — E43 Unspecified severe protein-calorie malnutrition: Secondary | ICD-10-CM | POA: Diagnosis not present

## 2018-11-22 DIAGNOSIS — Z23 Encounter for immunization: Secondary | ICD-10-CM | POA: Diagnosis not present

## 2018-11-22 DIAGNOSIS — E43 Unspecified severe protein-calorie malnutrition: Secondary | ICD-10-CM | POA: Diagnosis not present

## 2018-11-22 DIAGNOSIS — G114 Hereditary spastic paraplegia: Secondary | ICD-10-CM | POA: Diagnosis not present

## 2018-11-22 DIAGNOSIS — G35 Multiple sclerosis: Secondary | ICD-10-CM | POA: Diagnosis not present

## 2018-11-22 DIAGNOSIS — R293 Abnormal posture: Secondary | ICD-10-CM | POA: Diagnosis not present

## 2018-11-22 DIAGNOSIS — M6249 Contracture of muscle, multiple sites: Secondary | ICD-10-CM | POA: Diagnosis not present

## 2018-11-23 DIAGNOSIS — G35 Multiple sclerosis: Secondary | ICD-10-CM | POA: Diagnosis not present

## 2018-11-23 DIAGNOSIS — G114 Hereditary spastic paraplegia: Secondary | ICD-10-CM | POA: Diagnosis not present

## 2018-11-23 DIAGNOSIS — R293 Abnormal posture: Secondary | ICD-10-CM | POA: Diagnosis not present

## 2018-11-23 DIAGNOSIS — Z23 Encounter for immunization: Secondary | ICD-10-CM | POA: Diagnosis not present

## 2018-11-23 DIAGNOSIS — E43 Unspecified severe protein-calorie malnutrition: Secondary | ICD-10-CM | POA: Diagnosis not present

## 2018-11-23 DIAGNOSIS — M6249 Contracture of muscle, multiple sites: Secondary | ICD-10-CM | POA: Diagnosis not present

## 2018-11-24 DIAGNOSIS — G35 Multiple sclerosis: Secondary | ICD-10-CM | POA: Diagnosis not present

## 2018-11-24 DIAGNOSIS — F332 Major depressive disorder, recurrent severe without psychotic features: Secondary | ICD-10-CM | POA: Diagnosis not present

## 2018-11-24 DIAGNOSIS — E038 Other specified hypothyroidism: Secondary | ICD-10-CM | POA: Diagnosis not present

## 2018-11-24 DIAGNOSIS — R5381 Other malaise: Secondary | ICD-10-CM | POA: Diagnosis not present

## 2018-11-24 DIAGNOSIS — M62838 Other muscle spasm: Secondary | ICD-10-CM | POA: Diagnosis not present

## 2018-11-24 DIAGNOSIS — D508 Other iron deficiency anemias: Secondary | ICD-10-CM | POA: Diagnosis not present

## 2018-11-26 DIAGNOSIS — G114 Hereditary spastic paraplegia: Secondary | ICD-10-CM | POA: Diagnosis not present

## 2018-11-26 DIAGNOSIS — R293 Abnormal posture: Secondary | ICD-10-CM | POA: Diagnosis not present

## 2018-11-26 DIAGNOSIS — E43 Unspecified severe protein-calorie malnutrition: Secondary | ICD-10-CM | POA: Diagnosis not present

## 2018-11-26 DIAGNOSIS — G35 Multiple sclerosis: Secondary | ICD-10-CM | POA: Diagnosis not present

## 2018-11-26 DIAGNOSIS — M6249 Contracture of muscle, multiple sites: Secondary | ICD-10-CM | POA: Diagnosis not present

## 2018-11-26 DIAGNOSIS — Z23 Encounter for immunization: Secondary | ICD-10-CM | POA: Diagnosis not present

## 2018-11-28 DIAGNOSIS — Z20828 Contact with and (suspected) exposure to other viral communicable diseases: Secondary | ICD-10-CM | POA: Diagnosis not present

## 2018-11-28 DIAGNOSIS — E43 Unspecified severe protein-calorie malnutrition: Secondary | ICD-10-CM | POA: Diagnosis not present

## 2018-11-28 DIAGNOSIS — G114 Hereditary spastic paraplegia: Secondary | ICD-10-CM | POA: Diagnosis not present

## 2018-11-28 DIAGNOSIS — G35 Multiple sclerosis: Secondary | ICD-10-CM | POA: Diagnosis not present

## 2018-11-28 DIAGNOSIS — M6249 Contracture of muscle, multiple sites: Secondary | ICD-10-CM | POA: Diagnosis not present

## 2018-11-28 DIAGNOSIS — R293 Abnormal posture: Secondary | ICD-10-CM | POA: Diagnosis not present

## 2018-11-28 DIAGNOSIS — Z23 Encounter for immunization: Secondary | ICD-10-CM | POA: Diagnosis not present

## 2018-11-29 DIAGNOSIS — F4321 Adjustment disorder with depressed mood: Secondary | ICD-10-CM | POA: Diagnosis not present

## 2018-12-04 DIAGNOSIS — Z20828 Contact with and (suspected) exposure to other viral communicable diseases: Secondary | ICD-10-CM | POA: Diagnosis not present

## 2018-12-05 DIAGNOSIS — L03115 Cellulitis of right lower limb: Secondary | ICD-10-CM | POA: Diagnosis not present

## 2018-12-05 DIAGNOSIS — L89899 Pressure ulcer of other site, unspecified stage: Secondary | ICD-10-CM | POA: Diagnosis not present

## 2018-12-05 DIAGNOSIS — L89329 Pressure ulcer of left buttock, unspecified stage: Secondary | ICD-10-CM | POA: Diagnosis not present

## 2018-12-05 DIAGNOSIS — L89319 Pressure ulcer of right buttock, unspecified stage: Secondary | ICD-10-CM | POA: Diagnosis not present

## 2018-12-09 DIAGNOSIS — E43 Unspecified severe protein-calorie malnutrition: Secondary | ICD-10-CM | POA: Diagnosis not present

## 2018-12-09 DIAGNOSIS — M6281 Muscle weakness (generalized): Secondary | ICD-10-CM | POA: Diagnosis not present

## 2018-12-09 DIAGNOSIS — L89310 Pressure ulcer of right buttock, unstageable: Secondary | ICD-10-CM | POA: Diagnosis not present

## 2018-12-09 DIAGNOSIS — L89323 Pressure ulcer of left buttock, stage 3: Secondary | ICD-10-CM | POA: Diagnosis not present

## 2018-12-11 DIAGNOSIS — F4321 Adjustment disorder with depressed mood: Secondary | ICD-10-CM | POA: Diagnosis not present

## 2018-12-14 DIAGNOSIS — L03115 Cellulitis of right lower limb: Secondary | ICD-10-CM | POA: Diagnosis not present

## 2018-12-14 DIAGNOSIS — U071 COVID-19: Secondary | ICD-10-CM | POA: Diagnosis not present

## 2018-12-14 DIAGNOSIS — L89319 Pressure ulcer of right buttock, unspecified stage: Secondary | ICD-10-CM | POA: Diagnosis not present

## 2018-12-15 DIAGNOSIS — E43 Unspecified severe protein-calorie malnutrition: Secondary | ICD-10-CM | POA: Diagnosis not present

## 2018-12-15 DIAGNOSIS — M6281 Muscle weakness (generalized): Secondary | ICD-10-CM | POA: Diagnosis not present

## 2018-12-15 DIAGNOSIS — L89323 Pressure ulcer of left buttock, stage 3: Secondary | ICD-10-CM | POA: Diagnosis not present

## 2018-12-15 DIAGNOSIS — L89310 Pressure ulcer of right buttock, unstageable: Secondary | ICD-10-CM | POA: Diagnosis not present

## 2018-12-22 DIAGNOSIS — L89323 Pressure ulcer of left buttock, stage 3: Secondary | ICD-10-CM | POA: Diagnosis not present

## 2018-12-22 DIAGNOSIS — L89310 Pressure ulcer of right buttock, unstageable: Secondary | ICD-10-CM | POA: Diagnosis not present

## 2018-12-22 DIAGNOSIS — M6281 Muscle weakness (generalized): Secondary | ICD-10-CM | POA: Diagnosis not present

## 2018-12-22 DIAGNOSIS — E43 Unspecified severe protein-calorie malnutrition: Secondary | ICD-10-CM | POA: Diagnosis not present

## 2018-12-24 DIAGNOSIS — F4321 Adjustment disorder with depressed mood: Secondary | ICD-10-CM | POA: Diagnosis not present

## 2018-12-26 DIAGNOSIS — E038 Other specified hypothyroidism: Secondary | ICD-10-CM | POA: Diagnosis not present

## 2018-12-26 DIAGNOSIS — F332 Major depressive disorder, recurrent severe without psychotic features: Secondary | ICD-10-CM | POA: Diagnosis not present

## 2018-12-30 DIAGNOSIS — L89323 Pressure ulcer of left buttock, stage 3: Secondary | ICD-10-CM | POA: Diagnosis not present

## 2018-12-30 DIAGNOSIS — L89310 Pressure ulcer of right buttock, unstageable: Secondary | ICD-10-CM | POA: Diagnosis not present

## 2018-12-30 DIAGNOSIS — E43 Unspecified severe protein-calorie malnutrition: Secondary | ICD-10-CM | POA: Diagnosis not present

## 2018-12-30 DIAGNOSIS — M70861 Other soft tissue disorders related to use, overuse and pressure, right lower leg: Secondary | ICD-10-CM | POA: Diagnosis not present

## 2018-12-31 DIAGNOSIS — F4321 Adjustment disorder with depressed mood: Secondary | ICD-10-CM | POA: Diagnosis not present

## 2019-01-03 DIAGNOSIS — L89323 Pressure ulcer of left buttock, stage 3: Secondary | ICD-10-CM | POA: Diagnosis not present

## 2019-01-03 DIAGNOSIS — M6281 Muscle weakness (generalized): Secondary | ICD-10-CM | POA: Diagnosis not present

## 2019-01-03 DIAGNOSIS — E43 Unspecified severe protein-calorie malnutrition: Secondary | ICD-10-CM | POA: Diagnosis not present

## 2019-01-03 DIAGNOSIS — L89310 Pressure ulcer of right buttock, unstageable: Secondary | ICD-10-CM | POA: Diagnosis not present

## 2019-01-04 DIAGNOSIS — I739 Peripheral vascular disease, unspecified: Secondary | ICD-10-CM | POA: Diagnosis not present

## 2019-01-04 DIAGNOSIS — L602 Onychogryphosis: Secondary | ICD-10-CM | POA: Diagnosis not present

## 2019-01-10 DIAGNOSIS — E43 Unspecified severe protein-calorie malnutrition: Secondary | ICD-10-CM | POA: Diagnosis not present

## 2019-01-10 DIAGNOSIS — L89323 Pressure ulcer of left buttock, stage 3: Secondary | ICD-10-CM | POA: Diagnosis not present

## 2019-01-10 DIAGNOSIS — M6281 Muscle weakness (generalized): Secondary | ICD-10-CM | POA: Diagnosis not present

## 2019-01-10 DIAGNOSIS — L89310 Pressure ulcer of right buttock, unstageable: Secondary | ICD-10-CM | POA: Diagnosis not present

## 2019-01-11 DIAGNOSIS — L89899 Pressure ulcer of other site, unspecified stage: Secondary | ICD-10-CM | POA: Diagnosis not present

## 2019-01-11 DIAGNOSIS — G35 Multiple sclerosis: Secondary | ICD-10-CM | POA: Diagnosis not present

## 2019-01-11 DIAGNOSIS — L89319 Pressure ulcer of right buttock, unspecified stage: Secondary | ICD-10-CM | POA: Diagnosis not present

## 2019-01-11 DIAGNOSIS — U071 COVID-19: Secondary | ICD-10-CM | POA: Diagnosis not present

## 2019-02-07 DIAGNOSIS — M70862 Other soft tissue disorders related to use, overuse and pressure, left lower leg: Secondary | ICD-10-CM | POA: Diagnosis not present

## 2019-02-07 DIAGNOSIS — M70861 Other soft tissue disorders related to use, overuse and pressure, right lower leg: Secondary | ICD-10-CM | POA: Diagnosis not present

## 2019-02-07 DIAGNOSIS — E43 Unspecified severe protein-calorie malnutrition: Secondary | ICD-10-CM | POA: Diagnosis not present

## 2019-02-07 DIAGNOSIS — M6281 Muscle weakness (generalized): Secondary | ICD-10-CM | POA: Diagnosis not present

## 2019-02-08 DIAGNOSIS — Z23 Encounter for immunization: Secondary | ICD-10-CM | POA: Diagnosis not present

## 2019-02-13 DIAGNOSIS — F4321 Adjustment disorder with depressed mood: Secondary | ICD-10-CM | POA: Diagnosis not present

## 2019-02-14 DIAGNOSIS — E43 Unspecified severe protein-calorie malnutrition: Secondary | ICD-10-CM | POA: Diagnosis not present

## 2019-02-14 DIAGNOSIS — M6281 Muscle weakness (generalized): Secondary | ICD-10-CM | POA: Diagnosis not present

## 2019-02-14 DIAGNOSIS — M70861 Other soft tissue disorders related to use, overuse and pressure, right lower leg: Secondary | ICD-10-CM | POA: Diagnosis not present

## 2019-02-14 DIAGNOSIS — M70862 Other soft tissue disorders related to use, overuse and pressure, left lower leg: Secondary | ICD-10-CM | POA: Diagnosis not present

## 2019-02-15 DIAGNOSIS — E038 Other specified hypothyroidism: Secondary | ICD-10-CM | POA: Diagnosis not present

## 2019-02-15 DIAGNOSIS — F332 Major depressive disorder, recurrent severe without psychotic features: Secondary | ICD-10-CM | POA: Diagnosis not present

## 2019-02-21 DIAGNOSIS — M6281 Muscle weakness (generalized): Secondary | ICD-10-CM | POA: Diagnosis not present

## 2019-02-21 DIAGNOSIS — M70862 Other soft tissue disorders related to use, overuse and pressure, left lower leg: Secondary | ICD-10-CM | POA: Diagnosis not present

## 2019-02-21 DIAGNOSIS — M70861 Other soft tissue disorders related to use, overuse and pressure, right lower leg: Secondary | ICD-10-CM | POA: Diagnosis not present

## 2019-02-21 DIAGNOSIS — E43 Unspecified severe protein-calorie malnutrition: Secondary | ICD-10-CM | POA: Diagnosis not present

## 2019-02-26 DIAGNOSIS — F4321 Adjustment disorder with depressed mood: Secondary | ICD-10-CM | POA: Diagnosis not present

## 2019-02-28 DIAGNOSIS — M70862 Other soft tissue disorders related to use, overuse and pressure, left lower leg: Secondary | ICD-10-CM | POA: Diagnosis not present

## 2019-02-28 DIAGNOSIS — M6281 Muscle weakness (generalized): Secondary | ICD-10-CM | POA: Diagnosis not present

## 2019-02-28 DIAGNOSIS — E43 Unspecified severe protein-calorie malnutrition: Secondary | ICD-10-CM | POA: Diagnosis not present

## 2019-02-28 DIAGNOSIS — M70861 Other soft tissue disorders related to use, overuse and pressure, right lower leg: Secondary | ICD-10-CM | POA: Diagnosis not present

## 2019-03-07 DIAGNOSIS — M6281 Muscle weakness (generalized): Secondary | ICD-10-CM | POA: Diagnosis not present

## 2019-03-07 DIAGNOSIS — G35 Multiple sclerosis: Secondary | ICD-10-CM | POA: Diagnosis not present

## 2019-03-07 DIAGNOSIS — K219 Gastro-esophageal reflux disease without esophagitis: Secondary | ICD-10-CM | POA: Diagnosis not present

## 2019-03-07 DIAGNOSIS — M70861 Other soft tissue disorders related to use, overuse and pressure, right lower leg: Secondary | ICD-10-CM | POA: Diagnosis not present

## 2019-03-07 DIAGNOSIS — N319 Neuromuscular dysfunction of bladder, unspecified: Secondary | ICD-10-CM | POA: Diagnosis not present

## 2019-03-07 DIAGNOSIS — E43 Unspecified severe protein-calorie malnutrition: Secondary | ICD-10-CM | POA: Diagnosis not present

## 2019-03-07 DIAGNOSIS — M70862 Other soft tissue disorders related to use, overuse and pressure, left lower leg: Secondary | ICD-10-CM | POA: Diagnosis not present

## 2019-03-07 DIAGNOSIS — E039 Hypothyroidism, unspecified: Secondary | ICD-10-CM | POA: Diagnosis not present

## 2019-03-08 DIAGNOSIS — K219 Gastro-esophageal reflux disease without esophagitis: Secondary | ICD-10-CM | POA: Diagnosis not present

## 2019-03-08 DIAGNOSIS — R5381 Other malaise: Secondary | ICD-10-CM | POA: Diagnosis not present

## 2019-03-08 DIAGNOSIS — E039 Hypothyroidism, unspecified: Secondary | ICD-10-CM | POA: Diagnosis not present

## 2019-03-08 DIAGNOSIS — Z23 Encounter for immunization: Secondary | ICD-10-CM | POA: Diagnosis not present

## 2019-03-08 DIAGNOSIS — G35 Multiple sclerosis: Secondary | ICD-10-CM | POA: Diagnosis not present

## 2019-03-12 DIAGNOSIS — E559 Vitamin D deficiency, unspecified: Secondary | ICD-10-CM | POA: Diagnosis not present

## 2019-03-12 DIAGNOSIS — D649 Anemia, unspecified: Secondary | ICD-10-CM | POA: Diagnosis not present

## 2019-03-12 DIAGNOSIS — Z79899 Other long term (current) drug therapy: Secondary | ICD-10-CM | POA: Diagnosis not present

## 2019-03-12 DIAGNOSIS — F4321 Adjustment disorder with depressed mood: Secondary | ICD-10-CM | POA: Diagnosis not present

## 2019-03-12 DIAGNOSIS — E785 Hyperlipidemia, unspecified: Secondary | ICD-10-CM | POA: Diagnosis not present

## 2019-03-12 DIAGNOSIS — E039 Hypothyroidism, unspecified: Secondary | ICD-10-CM | POA: Diagnosis not present

## 2019-03-12 DIAGNOSIS — E038 Other specified hypothyroidism: Secondary | ICD-10-CM | POA: Diagnosis not present

## 2019-03-12 DIAGNOSIS — E119 Type 2 diabetes mellitus without complications: Secondary | ICD-10-CM | POA: Diagnosis not present

## 2019-03-12 DIAGNOSIS — M319 Necrotizing vasculopathy, unspecified: Secondary | ICD-10-CM | POA: Diagnosis not present

## 2019-03-14 DIAGNOSIS — M6281 Muscle weakness (generalized): Secondary | ICD-10-CM | POA: Diagnosis not present

## 2019-03-14 DIAGNOSIS — M70861 Other soft tissue disorders related to use, overuse and pressure, right lower leg: Secondary | ICD-10-CM | POA: Diagnosis not present

## 2019-03-14 DIAGNOSIS — E43 Unspecified severe protein-calorie malnutrition: Secondary | ICD-10-CM | POA: Diagnosis not present

## 2019-03-14 DIAGNOSIS — M70862 Other soft tissue disorders related to use, overuse and pressure, left lower leg: Secondary | ICD-10-CM | POA: Diagnosis not present

## 2019-03-20 DIAGNOSIS — Z20828 Contact with and (suspected) exposure to other viral communicable diseases: Secondary | ICD-10-CM | POA: Diagnosis not present

## 2019-03-21 DIAGNOSIS — E43 Unspecified severe protein-calorie malnutrition: Secondary | ICD-10-CM | POA: Diagnosis not present

## 2019-03-21 DIAGNOSIS — M70861 Other soft tissue disorders related to use, overuse and pressure, right lower leg: Secondary | ICD-10-CM | POA: Diagnosis not present

## 2019-03-21 DIAGNOSIS — M70862 Other soft tissue disorders related to use, overuse and pressure, left lower leg: Secondary | ICD-10-CM | POA: Diagnosis not present

## 2019-03-21 DIAGNOSIS — M6281 Muscle weakness (generalized): Secondary | ICD-10-CM | POA: Diagnosis not present

## 2019-03-24 DIAGNOSIS — Z20828 Contact with and (suspected) exposure to other viral communicable diseases: Secondary | ICD-10-CM | POA: Diagnosis not present

## 2019-03-26 DIAGNOSIS — F4321 Adjustment disorder with depressed mood: Secondary | ICD-10-CM | POA: Diagnosis not present

## 2019-03-27 DIAGNOSIS — Z20828 Contact with and (suspected) exposure to other viral communicable diseases: Secondary | ICD-10-CM | POA: Diagnosis not present

## 2019-03-30 DIAGNOSIS — I739 Peripheral vascular disease, unspecified: Secondary | ICD-10-CM | POA: Diagnosis not present

## 2019-03-30 DIAGNOSIS — B351 Tinea unguium: Secondary | ICD-10-CM | POA: Diagnosis not present

## 2019-04-02 DIAGNOSIS — Z20828 Contact with and (suspected) exposure to other viral communicable diseases: Secondary | ICD-10-CM | POA: Diagnosis not present

## 2019-04-09 DIAGNOSIS — Z20828 Contact with and (suspected) exposure to other viral communicable diseases: Secondary | ICD-10-CM | POA: Diagnosis not present

## 2019-04-13 DIAGNOSIS — F4321 Adjustment disorder with depressed mood: Secondary | ICD-10-CM | POA: Diagnosis not present

## 2019-04-16 DIAGNOSIS — Z20828 Contact with and (suspected) exposure to other viral communicable diseases: Secondary | ICD-10-CM | POA: Diagnosis not present

## 2019-04-23 DIAGNOSIS — Z20828 Contact with and (suspected) exposure to other viral communicable diseases: Secondary | ICD-10-CM | POA: Diagnosis not present

## 2019-04-28 DIAGNOSIS — Z20828 Contact with and (suspected) exposure to other viral communicable diseases: Secondary | ICD-10-CM | POA: Diagnosis not present

## 2019-05-01 DIAGNOSIS — F4321 Adjustment disorder with depressed mood: Secondary | ICD-10-CM | POA: Diagnosis not present

## 2019-06-27 DIAGNOSIS — L89324 Pressure ulcer of left buttock, stage 4: Secondary | ICD-10-CM | POA: Diagnosis not present

## 2019-06-27 DIAGNOSIS — L89214 Pressure ulcer of right hip, stage 4: Secondary | ICD-10-CM | POA: Diagnosis not present

## 2019-06-27 DIAGNOSIS — E43 Unspecified severe protein-calorie malnutrition: Secondary | ICD-10-CM | POA: Diagnosis not present

## 2019-06-27 DIAGNOSIS — M6281 Muscle weakness (generalized): Secondary | ICD-10-CM | POA: Diagnosis not present

## 2020-05-06 ENCOUNTER — Ambulatory Visit (INDEPENDENT_AMBULATORY_CARE_PROVIDER_SITE_OTHER): Payer: Medicare (Managed Care) | Admitting: Neurology

## 2020-05-06 ENCOUNTER — Encounter: Payer: Self-pay | Admitting: Neurology

## 2020-05-06 VITALS — BP 102/60 | HR 72

## 2020-05-06 DIAGNOSIS — G35 Multiple sclerosis: Secondary | ICD-10-CM

## 2020-05-06 DIAGNOSIS — G8220 Paraplegia, unspecified: Secondary | ICD-10-CM

## 2020-05-06 NOTE — Progress Notes (Signed)
Chief Complaint  Patient presents with  . Follow-up    Resides at [facility] home. Wheelchair bound. MS - last seen June 2020. She is not on any disease modifying medications. She has no new concerns today. She is taking baclofen 10mg  TID and tizanidine 2mg  TID which provide great benefit. Says she "feels great - better than she has felt in years".       ASSESSMENT AND PLAN  Deanna Baldwin is a 45 y.o. female   Secondary progressive multiple sclerosis,  No longer has clinical flareups, MRI of the brain's has been stable,  No need for long-term immunomodulation therapy  Spastic paraplegia  Significant spastic paraplegia, fixed the posturing of forceful hip flexion, adduction  Continue current symptomatic treatment only baclofen 10 mg 3 times a day, Tylenol, NSAIDs as needed  Only return to clinic for new issues  DIAGNOSTIC DATA (LABS, IMAGING, TESTING) - I reviewed patient records, labs, notes, testing and imaging myself where available.   HISTORICAL  Deanna Baldwin, is a 45 year old female, suffered long history of multiple sclerosis, diagnosed was made in 2000,  seen in request by her primary care physician Dr. Elijio Miles, Chauncey Cruel Ahmed for follow up  She is currently a nursing home resident  She lived at home with her 22 years old son and her aunt was her primary caregiver before she moved to current nursing home in 2017, her aunt visit her occasionally, facility is in charge of her medical care.  She has 2 other sisters, one of them is her fraternal sister.  She was diagnosed with relapsing remitting multiple sclerosis since age 62, she initially presented with blurry vision, moderate fatigue, difficulty walking, she had frequent flareups over the years, eventually become wheelchair-bound since 2011,  She was under the care of neurologist at [facility] Dr. [name], I was able to review Dr. [name] note from 2013 to most recent in February 2017, she had  allergic reaction to Tysarbri IV infusion, was treated with rituximab since early 2013, 1000 mg every 6 months, she tolerated very well, infusion was in December 2016, because of her social status change, she was not able to get it every 6 months. For a while she was taking Ampyra for gait abnormality, but has stopped it because it is not helping her  Based on last neurology examination in February 2017, she was not ambulatory, in a power wheelchair, significant bilateral lower extremity spasticity, only has trace movement of bilateral leg, upper extremity motor strengths were well-preserved.  MRI of the brain and spine in May 2015, chronic demyelinating lesions, no enhancing lesion.  She has multiple hospital admission recently, for frequent UTI, decubitus ulcer, with progressive increased weakness, also increased spasticity of bilateral lower extremity, decreased mobility to moving the chair, even to hold her sit up position,  I was able to review MRI of the cervical spine with and without contrast in May 2015, multifocal patchy area of T2/FLAIR signal abnormality in the entire cervical cord, visualized brainstem and upper thoracic spine, no contrast enhancement.  MRI of the brain 2015, stable November and distribution of numerous hyper intense flair lesions throughout the subcortical, deep and periventricular white matter, involvement of entire corpus callosum, as well as midbrain and pons, right cerebellum.  MRI of the brain 2012, questionable enhancement,  UPDATE Jan 19 2017: MRI of the brain with and without contrast in June 2018: 1. Multiple infratentorial and hemispheric T2/FLAIR hyperintense foci consistent with chronic demyelinating plaque associated with multiple  sclerosis. None of the foci appeared to be acute. 2. Moderate cortical atrophy and corpus callosum atrophy with mild brainstem atrophy. 3. There are no acute findings.  MRI of the cervical spine without contrast   1. Multiple T2 hyperintense foci within the spinal cord as detailed above. IV access was not obtainable for contrast. 2. Mild disc bulges at C3-C4, C4-C5 and C5-C6 but do not lead to any nerve root compression or central canal narrowing.  Patient received Rituxan 1000mg  on 10/11/16 from [facility] Dr. [name] office,. She received Orevus 300mg  on 11/10/16 from intra-fusion. Patient stated [facility] home, wants to keep appointment with [place]  Laboratory evaluation on November 29 2016 from [facility], RDW was elevated at 17.4, CD19 less than 1%, 20, less than 1%, normal liver functional test  She is wheelchair-bound, no memory loss, significant visual difficulty, spastic paraplegia  UPDATE Mar 09 2017: She came in for her first electrical stimulation guided xeomin injection for spastic paraplegia, we used 500 xeomin  UPDATE July 28 2017: She did receive her ocrelizumab infusion recently, she is here for repeat xeomin injection for spastic bilateral lower extremity,  UPDATE Sept 25 2019: Previous xeomin injection did help relaxing her leg, last ocrelizumab injection was on July 03 2017.  UPDATE Jan 19 2018: She was not sure about the benefit of previous Xeomin injection, she has fixed contraction of bilateral lower extremity, pain with passive movement,  Laboratory evaluation in September 2019 continue show abnormal liver functional test, rising alkaline phosphate 598, ALT 124, AST 70,  Last ocrelizumab infusion was in December 2019, will repeat laboratory evaluation, if there is significant abnormality, will hold off ocrelizumab infusion,  Virtual Visit via Video on July 18, 2018 She denies significant improvement from previous botulism toxin injection, has lost bilateral lower extremity mobility, decubitus ulcer is healing, she does not want to continue botulism toxin injection, last ocrelizumab infusion was in December 2019, after I discussed with patient,  she wants to hold off ocrelizumab infusion at this point,  Update May 06, 2020 She was brought in by transportation, sitting wheelchair alone, significant spastic paraplegia, fixed posturing, hip flexion to her abdomen, forceful hip adduction, uses right arm freely without difficulty, but has moderate spasticity mild weakness of left upper extremity, mild left elbow flexion, previously tried xeomin injection to spastic bilateral lower extremity with limited benefit because of her fixed contraction  Previously she suffered sacral osteomyelitis, decubitus ulcer, had transient diverting ileostomy,  She reported good appetite, denies difficulty talking, poor vision, OU visual acuity 20/400,  REVIEW OF SYSTEMS: Full 14 system review of systems performed and notable only for as above All other review of systems were negative.  PHYSICAL EXAM   Vitals:   05/06/20 1457  BP: 102/60  Pulse: 72   Not recorded     There is no height or weight on file to calculate BMI.  PHYSICAL EXAMNIATION:  Gen: NAD, conversant, well nourised, well groomed                     Cardiovascular: Regular rate rhythm, no peripheral edema, warm, nontender. Eyes: Conjunctivae clear without exudates or hemorrhage Neck: Supple, no carotid bruits. Pulmonary: Clear to auscultation bilaterally   NEUROLOGICAL EXAM:  MENTAL STATUS: Very thin lady, Speech:    Speech is normal; fluent and spontaneous with normal comprehension.  Cognition:     Orientation to time, place and person     Normal recent and remote memory  Normal Attention span and concentration     Normal Language, naming, repeating,spontaneous speech     Fund of knowledge   CRANIAL NERVES: CN II: Frequent spontaneous eye movement,OU 20/300. CN III, IV, VI: extraocular movement are normal. No ptosis. CN V: Facial sensation is intact to light touch CN VII: Face is symmetric with normal eye closure  CN VIII: Hearing is normal to causal  conversation. CN IX, X: Phonation is normal. CN XI: Head turning and shoulder shrug are intact  MOTOR: For movement of right upper extremity, mild to moderate spasticity of left upper extremity, with mild weakness, mild fixed contraction of left elbow, maximum extension 170 degree  Severe spastic quadriplegia, hip flexion, adduction,  REFLEXES: Hyperactive  COORDINATION: Mild bilateral finger-to-nose dysmetria  GAIT/STANCE: Nonambulatory  ALLERGIES: Allergies  Allergen Reactions  . Natalizumab     HOME MEDICATIONS: Current Outpatient Medications  Medication Sig Dispense Refill  . acetaminophen (TYLENOL) 325 MG tablet Take 2 tablets (650 mg total) by mouth every 6 (six) hours as needed for mild pain (or Fever >/= 101).    . Ascorbic Acid (VITAMIN C PO) Take 500 mg by mouth 2 (two) times daily.     . baclofen (LIORESAL) 10 MG tablet Take 10 mg by mouth 3 (three) times daily.    . bisacodyl (DULCOLAX) 5 MG EC tablet Take 5 mg by mouth daily.    . Calcium Carbonate-Vitamin D3 600-400 MG-UNIT TABS Take 2 tablets by mouth 3 (three) times daily.    . Cyanocobalamin (VITAMIN B-12 PO) Take 500 mcg by mouth daily.    Marland Kitchen ENSURE (ENSURE) Take 237 mLs by mouth. With meals. Vanilla    . ferrous sulfate 325 (65 FE) MG tablet Take 325 mg by mouth daily with breakfast.    . ibuprofen (ADVIL,MOTRIN) 200 MG tablet Take 200 mg by mouth every 12 (twelve) hours as needed.    Marland Kitchen levothyroxine (SYNTHROID) 100 MCG tablet Take 100 mcg by mouth daily before breakfast.    . midodrine (PROAMATINE) 2.5 MG tablet Take 1 tablet (2.5 mg total) by mouth 3 (three) times daily with meals.    . Multiple Vitamin (MULTIVITAMIN) tablet Take 1 tablet by mouth daily.    . Nutritional Supplements (NUTRITIONAL SUPPLEMENT PO) Regular Diet - Regular Texture    . Nutritional Supplements (PROMOD) LIQD Protein Liquid - give 30 ml by mouth two times daily    . omeprazole (PRILOSEC) 20 MG capsule Take 20 mg by mouth daily.     . ondansetron (ZOFRAN) 4 MG tablet Take 4 mg by mouth every 6 (six) hours as needed for nausea or vomiting.    . polyethylene glycol (MIRALAX / GLYCOLAX) packet Take 17 g by mouth daily.    . Probiotic Product (PROBIOTIC PO) Take 1 tablet by mouth daily.    . tizanidine (ZANAFLEX) 2 MG capsule Take 2 mg by mouth 3 (three) times daily.    . Vitamin D, Ergocalciferol, (DRISDOL) 50000 units CAPS capsule Take 50,000 Units by mouth every 30 (thirty) days.     No current facility-administered medications for this visit.    PAST MEDICAL HISTORY: Past Medical History:  Diagnosis Date  . Buttock wound 03/03/2016  . Dysrhythmia    tachycardia  . MS (multiple sclerosis) ([place])   . Neutropenia ([place])   . Pneumonia 02/2016  . Protein calorie malnutrition ([place])   . Sacral osteomyelitis ([place]) 05/19/2017  . Severe sepsis ([place]) 03/03/2016  . Spastic 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 ([place])   .  UTI (urinary tract infection) 02/2016    PAST SURGICAL HISTORY: Past Surgical History:  Procedure Laterality Date  . DIVERTING ILEOSTOMY N/A 09/03/2016   Procedure: ILEAL CONDUIT  URINARY DIVERSION OPEN;  Surgeon: Ardis Hughs, MD;  Location: [facility];  Service: Urology;  Laterality: N/A;    FAMILY HISTORY: Family History  Problem Relation Age of Onset  . Diabetes Mellitus I Mother   . Mental illness Sister     SOCIAL HISTORY: Social History   Socioeconomic History  . Marital status: Single    Spouse name: Not on file  . Number of children: 1  . Years of education: Not on file  . Highest education level: Not on file  Occupational History  . Occupation: Disabled  Tobacco Use  . Smoking status: Former Smoker    Packs/day: 1.00    Years: 10.00    Pack years: 10.00    Types: Cigarettes    Quit date: 02/01/2010    Years since quitting: 10.2  . Smokeless tobacco: Never Used  Vaping Use  . Vaping Use: Never used  Substance and Sexual Activity  . Alcohol use: No  . Drug use: No  .  Sexual activity: Not on file  Other Topics Concern  . Not on file  Social History Narrative   She is a resident at [facility].   Right-handed.   Social Determinants of Health   Financial Resource Strain: Not on file  Food Insecurity: Not on file  Transportation Needs: Not on file  Physical Activity: Not on file  Stress: Not on file  Social Connections: Not on file  Intimate Partner Violence: Not on file      Marcial Pacas, M.D. Ph.D.  [facility] [address] [place], [place] [address] Ph: [phone] Fax: [phone]  CC:  [name], MD [place],   [id number]  [name], MD

## 2020-10-13 ENCOUNTER — Inpatient Hospital Stay (HOSPITAL_COMMUNITY)
Admission: EM | Admit: 2020-10-13 | Discharge: 2020-10-26 | DRG: 641 | Disposition: A | Discharge status: Skilled Nursing Facility | Payer: Medicare (Managed Care) | Source: Skilled Nursing Facility | Attending: Internal Medicine | Admitting: Internal Medicine

## 2020-10-13 ENCOUNTER — Other Ambulatory Visit: Payer: Self-pay

## 2020-10-13 DIAGNOSIS — M4628 Osteomyelitis of vertebra, sacral and sacrococcygeal region: Secondary | ICD-10-CM | POA: Diagnosis present

## 2020-10-13 DIAGNOSIS — Z79890 Hormone replacement therapy (postmenopausal): Secondary | ICD-10-CM

## 2020-10-13 DIAGNOSIS — Z933 Colostomy status: Secondary | ICD-10-CM

## 2020-10-13 DIAGNOSIS — L8990 Pressure ulcer of unspecified site, unspecified stage: Secondary | ICD-10-CM | POA: Insufficient documentation

## 2020-10-13 DIAGNOSIS — E870 Hyperosmolality and hypernatremia: Secondary | ICD-10-CM | POA: Diagnosis present

## 2020-10-13 DIAGNOSIS — E440 Moderate protein-calorie malnutrition: Secondary | ICD-10-CM | POA: Diagnosis present

## 2020-10-13 DIAGNOSIS — L89222 Pressure ulcer of left hip, stage 2: Secondary | ICD-10-CM | POA: Diagnosis present

## 2020-10-13 DIAGNOSIS — N136 Pyonephrosis: Secondary | ICD-10-CM | POA: Diagnosis present

## 2020-10-13 DIAGNOSIS — R000 Tachycardia, unspecified: Secondary | ICD-10-CM | POA: Diagnosis present

## 2020-10-13 DIAGNOSIS — I9589 Other hypotension: Secondary | ICD-10-CM | POA: Diagnosis present

## 2020-10-13 DIAGNOSIS — E860 Dehydration: Secondary | ICD-10-CM | POA: Diagnosis present

## 2020-10-13 DIAGNOSIS — Z79899 Other long term (current) drug therapy: Secondary | ICD-10-CM

## 2020-10-13 DIAGNOSIS — Z87891 Personal history of nicotine dependence: Secondary | ICD-10-CM

## 2020-10-13 DIAGNOSIS — R509 Fever, unspecified: Secondary | ICD-10-CM

## 2020-10-13 DIAGNOSIS — E039 Hypothyroidism, unspecified: Secondary | ICD-10-CM | POA: Diagnosis present

## 2020-10-13 DIAGNOSIS — Z888 Allergy status to other drugs, medicaments and biological substances status: Secondary | ICD-10-CM

## 2020-10-13 DIAGNOSIS — Z681 Body mass index (BMI) 19 or less, adult: Secondary | ICD-10-CM

## 2020-10-13 DIAGNOSIS — R64 Cachexia: Secondary | ICD-10-CM | POA: Diagnosis present

## 2020-10-13 DIAGNOSIS — Z20822 Contact with and (suspected) exposure to covid-19: Secondary | ICD-10-CM | POA: Diagnosis present

## 2020-10-13 DIAGNOSIS — E872 Acidosis: Secondary | ICD-10-CM | POA: Diagnosis not present

## 2020-10-13 DIAGNOSIS — M6259 Muscle wasting and atrophy, not elsewhere classified, multiple sites: Secondary | ICD-10-CM | POA: Diagnosis present

## 2020-10-13 DIAGNOSIS — G8220 Paraplegia, unspecified: Secondary | ICD-10-CM | POA: Diagnosis present

## 2020-10-13 DIAGNOSIS — G35 Multiple sclerosis: Secondary | ICD-10-CM | POA: Diagnosis present

## 2020-10-13 DIAGNOSIS — M62838 Other muscle spasm: Secondary | ICD-10-CM | POA: Diagnosis present

## 2020-10-13 DIAGNOSIS — D638 Anemia in other chronic diseases classified elsewhere: Secondary | ICD-10-CM | POA: Diagnosis present

## 2020-10-13 DIAGNOSIS — L89890 Pressure ulcer of other site, unstageable: Secondary | ICD-10-CM | POA: Diagnosis present

## 2020-10-13 DIAGNOSIS — N179 Acute kidney failure, unspecified: Secondary | ICD-10-CM | POA: Diagnosis present

## 2020-10-13 DIAGNOSIS — E876 Hypokalemia: Secondary | ICD-10-CM | POA: Diagnosis not present

## 2020-10-13 DIAGNOSIS — N319 Neuromuscular dysfunction of bladder, unspecified: Secondary | ICD-10-CM | POA: Diagnosis present

## 2020-10-13 DIAGNOSIS — R195 Other fecal abnormalities: Secondary | ICD-10-CM | POA: Diagnosis present

## 2020-10-13 LAB — CBC WITH DIFFERENTIAL/PLATELET
Abs Immature Granulocytes: 0.06 10*3/uL (ref 0.00–0.07)
Basophils Absolute: 0 10*3/uL (ref 0.0–0.1)
Basophils Relative: 0 %
Eosinophils Absolute: 0.3 10*3/uL (ref 0.0–0.5)
Eosinophils Relative: 2 %
HCT: 36.3 % (ref 36.0–46.0)
Hemoglobin: 11.2 g/dL — ABNORMAL LOW (ref 12.0–15.0)
Immature Granulocytes: 1 %
Lymphocytes Relative: 9 %
Lymphs Abs: 1.1 10*3/uL (ref 0.7–4.0)
MCH: 29.4 pg (ref 26.0–34.0)
MCHC: 30.9 g/dL (ref 30.0–36.0)
MCV: 95.3 fL (ref 80.0–100.0)
Monocytes Absolute: 0.5 10*3/uL (ref 0.1–1.0)
Monocytes Relative: 4 %
Neutro Abs: 10.7 10*3/uL — ABNORMAL HIGH (ref 1.7–7.7)
Neutrophils Relative %: 84 %
Platelets: 408 10*3/uL — ABNORMAL HIGH (ref 150–400)
RBC: 3.81 MIL/uL — ABNORMAL LOW (ref 3.87–5.11)
RDW: 13.7 % (ref 11.5–15.5)
WBC: 12.7 10*3/uL — ABNORMAL HIGH (ref 4.0–10.5)
nRBC: 0 % (ref 0.0–0.2)

## 2020-10-13 LAB — COMPREHENSIVE METABOLIC PANEL
ALT: 10 U/L (ref 0–44)
AST: 23 U/L (ref 15–41)
Albumin: 3.3 g/dL — ABNORMAL LOW (ref 3.5–5.0)
Alkaline Phosphatase: 69 U/L (ref 38–126)
Anion gap: 9 (ref 5–15)
BUN: 114 mg/dL — ABNORMAL HIGH (ref 6–20)
CO2: 31 mmol/L (ref 22–32)
Calcium: >15 mg/dL (ref 8.9–10.3)
Chloride: 108 mmol/L (ref 98–111)
Creatinine, Ser: 3.08 mg/dL — ABNORMAL HIGH (ref 0.44–1.00)
GFR, Estimated: 18 mL/min — ABNORMAL LOW (ref >60)
Glucose, Bld: 101 mg/dL — ABNORMAL HIGH (ref 70–99)
Potassium: 3.5 mmol/L (ref 3.5–5.1)
Sodium: 148 mmol/L — ABNORMAL HIGH (ref 135–145)
Total Bilirubin: 0.6 mg/dL (ref 0.3–1.2)
Total Protein: 7 g/dL (ref 6.5–8.1)

## 2020-10-13 LAB — RESP PANEL BY RT-PCR (FLU A&B, COVID) ARPGX2
Influenza A by PCR: NEGATIVE
Influenza B by PCR: NEGATIVE
SARS Coronavirus 2 by RT PCR: NEGATIVE

## 2020-10-13 MED ORDER — SODIUM CHLORIDE 0.9 % IV BOLUS
1000.0000 mL | Freq: Once | INTRAVENOUS | Status: AC
  Administered 2020-10-14: 1000 mL via INTRAVENOUS
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
## 2020-10-13 NOTE — ED Notes (Signed)
RN unable to collect labs, phlebotomy to attempt.
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
## 2020-10-14 ENCOUNTER — Inpatient Hospital Stay (HOSPITAL_COMMUNITY): Payer: Medicare (Managed Care)

## 2020-10-14 DIAGNOSIS — R710 Precipitous drop in hematocrit: Secondary | ICD-10-CM | POA: Diagnosis not present

## 2020-10-14 DIAGNOSIS — G8220 Paraplegia, unspecified: Secondary | ICD-10-CM | POA: Diagnosis present

## 2020-10-14 DIAGNOSIS — Z681 Body mass index (BMI) 19 or less, adult: Secondary | ICD-10-CM | POA: Diagnosis not present

## 2020-10-14 DIAGNOSIS — M6259 Muscle wasting and atrophy, not elsewhere classified, multiple sites: Secondary | ICD-10-CM | POA: Diagnosis present

## 2020-10-14 DIAGNOSIS — R195 Other fecal abnormalities: Secondary | ICD-10-CM | POA: Diagnosis present

## 2020-10-14 DIAGNOSIS — Z20822 Contact with and (suspected) exposure to covid-19: Secondary | ICD-10-CM | POA: Diagnosis present

## 2020-10-14 DIAGNOSIS — D638 Anemia in other chronic diseases classified elsewhere: Secondary | ICD-10-CM | POA: Diagnosis present

## 2020-10-14 DIAGNOSIS — M4628 Osteomyelitis of vertebra, sacral and sacrococcygeal region: Secondary | ICD-10-CM | POA: Diagnosis present

## 2020-10-14 DIAGNOSIS — E870 Hyperosmolality and hypernatremia: Secondary | ICD-10-CM | POA: Diagnosis present

## 2020-10-14 DIAGNOSIS — N319 Neuromuscular dysfunction of bladder, unspecified: Secondary | ICD-10-CM | POA: Diagnosis present

## 2020-10-14 DIAGNOSIS — E872 Acidosis: Secondary | ICD-10-CM | POA: Diagnosis not present

## 2020-10-14 DIAGNOSIS — R509 Fever, unspecified: Secondary | ICD-10-CM | POA: Diagnosis not present

## 2020-10-14 DIAGNOSIS — M62838 Other muscle spasm: Secondary | ICD-10-CM | POA: Diagnosis present

## 2020-10-14 DIAGNOSIS — G35 Multiple sclerosis: Secondary | ICD-10-CM | POA: Diagnosis present

## 2020-10-14 DIAGNOSIS — Z933 Colostomy status: Secondary | ICD-10-CM | POA: Diagnosis not present

## 2020-10-14 DIAGNOSIS — N179 Acute kidney failure, unspecified: Secondary | ICD-10-CM | POA: Diagnosis present

## 2020-10-14 DIAGNOSIS — N136 Pyonephrosis: Secondary | ICD-10-CM | POA: Diagnosis present

## 2020-10-14 DIAGNOSIS — L89890 Pressure ulcer of other site, unstageable: Secondary | ICD-10-CM | POA: Diagnosis present

## 2020-10-14 DIAGNOSIS — E039 Hypothyroidism, unspecified: Secondary | ICD-10-CM | POA: Diagnosis present

## 2020-10-14 DIAGNOSIS — I9589 Other hypotension: Secondary | ICD-10-CM | POA: Diagnosis present

## 2020-10-14 DIAGNOSIS — L89222 Pressure ulcer of left hip, stage 2: Secondary | ICD-10-CM | POA: Diagnosis present

## 2020-10-14 DIAGNOSIS — E440 Moderate protein-calorie malnutrition: Secondary | ICD-10-CM | POA: Diagnosis present

## 2020-10-14 DIAGNOSIS — E860 Dehydration: Secondary | ICD-10-CM | POA: Diagnosis not present

## 2020-10-14 DIAGNOSIS — R64 Cachexia: Secondary | ICD-10-CM | POA: Diagnosis present

## 2020-10-14 HISTORY — DX: Hypercalcemia: E83.52

## 2020-10-14 LAB — RENAL FUNCTION PANEL
Albumin: 3 g/dL — ABNORMAL LOW (ref 3.5–5.0)
Anion gap: 6 (ref 5–15)
BUN: 106 mg/dL — ABNORMAL HIGH (ref 6–20)
CO2: 30 mmol/L (ref 22–32)
Calcium: >15 mg/dL (ref 8.9–10.3)
Chloride: 111 mmol/L (ref 98–111)
Creatinine, Ser: 2.88 mg/dL — ABNORMAL HIGH (ref 0.44–1.00)
GFR, Estimated: 20 mL/min — ABNORMAL LOW (ref >60)
Glucose, Bld: 82 mg/dL (ref 70–99)
Phosphorus: 2.6 mg/dL (ref 2.5–4.6)
Potassium: 3.7 mmol/L (ref 3.5–5.1)
Sodium: 147 mmol/L — ABNORMAL HIGH (ref 135–145)

## 2020-10-14 LAB — COMPREHENSIVE METABOLIC PANEL
ALT: 9 U/L (ref 0–44)
AST: 23 U/L (ref 15–41)
Albumin: 3.4 g/dL — ABNORMAL LOW (ref 3.5–5.0)
Alkaline Phosphatase: 73 U/L (ref 38–126)
Anion gap: 9 (ref 5–15)
BUN: 105 mg/dL — ABNORMAL HIGH (ref 6–20)
CO2: 33 mmol/L — ABNORMAL HIGH (ref 22–32)
Calcium: >15 mg/dL (ref 8.9–10.3)
Chloride: 104 mmol/L (ref 98–111)
Creatinine, Ser: 2.86 mg/dL — ABNORMAL HIGH (ref 0.44–1.00)
GFR, Estimated: 20 mL/min — ABNORMAL LOW (ref >60)
Glucose, Bld: 86 mg/dL (ref 70–99)
Potassium: 4 mmol/L (ref 3.5–5.1)
Sodium: 146 mmol/L — ABNORMAL HIGH (ref 135–145)
Total Bilirubin: 0.7 mg/dL (ref 0.3–1.2)
Total Protein: 7.3 g/dL (ref 6.5–8.1)

## 2020-10-14 LAB — CBC
HCT: 32 % — ABNORMAL LOW (ref 36.0–46.0)
Hemoglobin: 9.4 g/dL — ABNORMAL LOW (ref 12.0–15.0)
MCH: 29.3 pg (ref 26.0–34.0)
MCHC: 29.4 g/dL — ABNORMAL LOW (ref 30.0–36.0)
MCV: 99.7 fL (ref 80.0–100.0)
Platelets: 351 10*3/uL (ref 150–400)
RBC: 3.21 MIL/uL — ABNORMAL LOW (ref 3.87–5.11)
RDW: 13.9 % (ref 11.5–15.5)
WBC: 10.2 10*3/uL (ref 4.0–10.5)
nRBC: 0 % (ref 0.0–0.2)

## 2020-10-14 LAB — PHOSPHORUS: Phosphorus: 2.3 mg/dL — ABNORMAL LOW (ref 2.5–4.6)

## 2020-10-14 LAB — MAGNESIUM: Magnesium: 1.8 mg/dL (ref 1.7–2.4)

## 2020-10-14 LAB — SODIUM, URINE, RANDOM: Sodium, Ur: 88 mmol/L

## 2020-10-14 LAB — TSH: TSH: 5.832 u[IU]/mL — ABNORMAL HIGH (ref 0.350–4.500)

## 2020-10-14 LAB — HIV ANTIBODY (ROUTINE TESTING W REFLEX): HIV Screen 4th Generation wRfx: NONREACTIVE

## 2020-10-14 LAB — CREATININE, URINE, RANDOM: Creatinine, Urine: 21.58 mg/dL

## 2020-10-14 MED ORDER — SODIUM CHLORIDE 0.9 % IV BOLUS
1000.0000 mL | Freq: Once | INTRAVENOUS | Status: AC
  Administered 2020-10-14: 1000 mL via INTRAVENOUS

## 2020-10-14 MED ORDER — BACLOFEN 5 MG HALF TABLET: 5.0000 mg | ORAL_TABLET | Freq: Three times a day (TID) | ORAL | Status: DC

## 2020-10-14 MED ORDER — ACETAMINOPHEN 325 MG PO TABS
650.0000 mg | ORAL_TABLET | Freq: Four times a day (QID) | ORAL | Status: DC | PRN
  Administered 2020-10-17 – 2020-10-26 (×12): 650 mg via ORAL
  Filled 2020-10-14 (×12): qty 2

## 2020-10-14 MED ORDER — SODIUM CHLORIDE 0.9 % IV SOLN
INTRAVENOUS | Status: DC
  Administered 2020-10-14: 150 mL via INTRAVENOUS

## 2020-10-14 MED ORDER — PANTOPRAZOLE SODIUM 40 MG PO TBEC
40.0000 mg | DELAYED_RELEASE_TABLET | Freq: Every day | ORAL | Status: DC
  Administered 2020-10-15 – 2020-10-23 (×9): 40 mg via ORAL
  Filled 2020-10-14 (×9): qty 1

## 2020-10-14 MED ORDER — VITAMIN B-12 1000 MCG PO TABS
500.0000 ug | ORAL_TABLET | Freq: Every day | ORAL | Status: DC
  Administered 2020-10-15 – 2020-10-26 (×12): 500 ug via ORAL
  Filled 2020-10-14: qty 5
  Filled 2020-10-14 (×10): qty 1
  Filled 2020-10-14: qty 5
  Filled 2020-10-14: qty 1

## 2020-10-14 MED ORDER — ADULT MULTIVITAMIN W/MINERALS CH
1.0000 | ORAL_TABLET | Freq: Every day | ORAL | Status: DC
  Administered 2020-10-15 – 2020-10-26 (×12): 1 via ORAL
  Filled 2020-10-14 (×12): qty 1

## 2020-10-14 MED ORDER — HEPARIN SODIUM (PORCINE) 5000 UNIT/ML IJ SOLN
5000.0000 [IU] | Freq: Three times a day (TID) | INTRAMUSCULAR | Status: DC
  Administered 2020-10-14 – 2020-10-26 (×37): 5000 [IU] via SUBCUTANEOUS
  Filled 2020-10-14 (×35): qty 1

## 2020-10-14 MED ORDER — SODIUM CHLORIDE 0.9 % IV SOLN: INTRAVENOUS | Status: DC

## 2020-10-14 MED ORDER — LEVOTHYROXINE SODIUM 100 MCG PO TABS
100.0000 ug | ORAL_TABLET | Freq: Every day | ORAL | Status: DC
  Administered 2020-10-15 – 2020-10-26 (×12): 100 ug via ORAL
  Filled 2020-10-14 (×13): qty 1

## 2020-10-14 MED ORDER — CALCITONIN (SALMON) 200 UNIT/ML IJ SOLN
200.0000 [IU] | Freq: Two times a day (BID) | INTRAMUSCULAR | Status: DC
  Administered 2020-10-14 (×2): 200 [IU] via SUBCUTANEOUS
  Filled 2020-10-14 (×5): qty 1

## 2020-10-14 MED ORDER — ACETAMINOPHEN 650 MG RE SUPP: 650.0000 mg | Freq: Four times a day (QID) | RECTAL | Status: DC | PRN

## 2020-10-14 MED ORDER — FERROUS SULFATE 325 (65 FE) MG PO TABS
325.0000 mg | ORAL_TABLET | Freq: Every day | ORAL | Status: DC
  Administered 2020-10-15 – 2020-10-18 (×3): 325 mg via ORAL
  Filled 2020-10-14 (×5): qty 1

## 2020-10-14 NOTE — ED Notes (Signed)
Patient transported to Ultrasound 

## 2020-10-14 NOTE — ED Notes (Signed)
Pt care taken pt resting, just finished her supper, ate 1/2, no complaints at this time
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
## 2020-10-14 NOTE — Consult Note (Signed)
Consult received for this patient for wound and ostomy care. This patient has a Urostomy and the ostomy orders with the correct Kellie Simmering numbers have been added to the orders. Will follow up later today for wound care. Waiting for patient to be put in a room or moved from the hall for privacy to complete total skin assessment.   Cathlean Marseilles Tamala Julian, MSN, RN, [place], Lysle Pearl, [facility] Wound Treatment Associate Pager [phone]

## 2020-10-14 NOTE — Progress Notes (Addendum)
Patient seen and examined personally, I reviewed the chart, history and physical and admission note, done by admitting physician this morning and agree with the same with following addendum.  Please refer to the morning admission note for more detailed plan of care.  Briefly,   26 old female with history of hypothyroidism, MS with contractures of her legs neuromuscular dysfunctions of bladder, status post ostomy for bowel movement, multiple pressure ulcers, with ambulatory dysfunction/sedentary, history of hypotension on midodrine, hypothyroidism from [place] sent to the ED for electrolyte abnormality Apparently patient was getting IV antibiotics for change in mental status at the facility. In the ED had leukocytosis severely elevated calcium level creatinine 3.0 EKG NSR, nephro was consulted and patient was admitted.  She is waiting for a bed in the ED alert awake with baseline ??  Confusion, appears very dry feels thirsty.  Appears poor historian. Colostomy with liquid output.  Contractures of severe extent noted in lower extremities and upper extremities  Issues:  Acute kidney injury:  peak 3.0,Likely hemodynamic mediated with poor oral intake/volume contraction and hypercalcemia.  Appreciate nephrology input on board continue fluid hydration and follow-up further work-up follow-up labs, monitor strict intake output. Recent Labs  Lab 10/13/20 2139 10/14/20 0015 10/14/20 0215  BUN 114* 105* 106*  CREATININE 3.08* 2.86* 2.88*    Severe hypercalcemia: Suspecting in the setting of volume contraction.  Appreciate nephrology input managing with isotonic hydration and calcitonin and once renal function stabilizes can try biphosphonate's.  Further work-up with PTH, urine calcium, 25-hydroxy vitamin D.  Renal ultrasound reviewed, UA pending.  Hypernatremia: Again suspecting from free water deficit.  Managing with IV fluids Recent Labs  Lab 10/13/20 2139 10/14/20 0015 10/14/20 0215  NA  148* 146* 147*    Leukocytosis: Without clear source of infection does have multiple pressure ulcers. is afebrile. Recent Labs  Lab 10/13/20 2139  WBC 12.7*    History of multiple sclerosis with spastic paraplegia/bladder dysfunction Ambulatory dysfunction/functional quadriplegia: On baclofen/and tizanidine holding due to AKI for now  status post colostomy-monitor ostomy output.  On MiraLAX at facility. Hypotension on midodrine Hypothyroidism-continue her Synthroid  Moderate bilateral hydronephrosis/9 mm right lower pole calculus in the setting of neuromuscular dysfunction of bladder  Anemia likely from chronic disease continue iron supplement.  Monitor hemoglobin Recent Labs  Lab 10/13/20 2139  HGB 11.2*  HCT 36.3    Low albumin/suspecting malnutrition/cachectic-augment diet.

## 2020-10-14 NOTE — Consult Note (Signed)
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  She was brought to the emergency room with concerns of abnormal labs with a reported calcium level of 22, sodium of 156 and elevated BUN and creatinine.  She reports generalized fatigue/feeling poorly with decreased appetite and some intermittent abdominal pain for the past few days.  She earlier reported of some rectal bleeding and leakage around her urostomy bag.  EMS who transported her here were concerned about her quality of care with pressure sores noted on hips and other dependent areas.  Labs done in the emergency room were significant for calcium level >15, albumin 3.3, BUN 114, creatinine 3.1 and sodium 148 with a bicarbonate of 31.  She has mild leukocytosis of 12,700 with hemoglobin of 11.2.  Facility MAR significant for ibuprofen 200 mg twice daily as needed-she reports that she has not taken this in several days.  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 Her  smoking use included cigarettes. She has a 10.00 pack-year smoking history. She has never used smokeless tobacco. She reports that she does not drink alcohol and does not use drugs.  Allergies:  Allergies  Allergen Reactions   Natalizumab     Medications: Scheduled:  calcitonin  4 Units/kg Subcutaneous BID   Continuous:  sodium chloride      BMP Latest Ref Rng & Units 10/13/2020 01/19/2018 10/18/2017  Glucose 70 - 99 mg/dL 101(H) 90 -  BUN 6 - 20 mg/dL 114(H) 27(H) 36(A)  Creatinine 0.44 - 1.00 mg/dL 3.08(H) 0.69 0.7  BUN/Creat Ratio 9 - 23 - 39(H) -  Sodium 135 - 145 mmol/L 148(H) 144 143  Potassium 3.5 - 5.1 mmol/L 3.5 4.1 4.1  Chloride 98 - 111 mmol/L 108 103 -  CO2 22 - 32 mmol/L 31 25 -  Calcium 8.9 - 10.3 mg/dL >15.0(HH) 9.5 -   CBC Latest Ref Rng & Units 10/13/2020 01/19/2018 08/08/2017  WBC 4.0 - 10.5 K/uL 12.7(H) 8.4 7.9  Hemoglobin 12.0 - 15.0 g/dL 11.2(L) 9.3(L) 8.3(L)  Hematocrit 36.0 - 46.0 % 36.3 28.6(L) 26.2(L)  Platelets 150 - 400 K/uL 408(H) 576(H) 404(H)     No results found.  Review of Systems  Constitutional:  Positive for appetite change and fatigue. Negative for chills and fever.  HENT:  Negative for nosebleeds, sore throat and trouble swallowing.   Eyes:  Negative for redness and visual disturbance.  Respiratory:  Negative for cough and shortness of breath.   Cardiovascular:  Negative for  chest pain and leg swelling.  Gastrointestinal:  Positive for abdominal pain and blood in stool. Negative for diarrhea, nausea and vomiting.  Genitourinary:  Positive for difficulty urinating. Negative for dysuria.  Musculoskeletal:  Negative for back pain and myalgias.  Blood pressure 113/77, pulse 99, temperature (!) 97.5 F (36.4 C), temperature source Axillary, resp. rate 16, SpO2 98 %. Physical Exam Vitals and nursing note reviewed.  Constitutional:      Appearance: She is ill-appearing.     Comments: Appears cachectic and with lower extremity contractures   HENT:     Head: Normocephalic.     Right Ear: External ear normal.     Left Ear: External ear normal.     Mouth/Throat:     Mouth: Mucous membranes are dry.  Eyes:     Extraocular Movements: Extraocular movements intact.     Conjunctiva/sclera: Conjunctivae normal.  Cardiovascular:     Rate and Rhythm: Normal rate and regular rhythm.     Pulses: Normal pulses.  Pulmonary:     Effort: Pulmonary effort is normal.     Breath sounds: Normal breath sounds. No wheezing or rales.  Abdominal:     General: Abdomen is flat.     Palpations: Abdomen is soft.     Comments: RLQ urostomy bag  Musculoskeletal:     Cervical back: Normal range of motion and neck supple. No rigidity.     Right lower leg: No edema.     Left lower leg: No edema.     Comments: Some focal areas of skin tears noted in lower extremities that are flexed at hips/knees  Skin:    General: Skin is warm and dry.     Coloration: Skin is pale.  Neurological:     Mental Status: She is alert and oriented to person, place, and time.    Assessment/Plan: 1.  Acute kidney injury: Likely hemodynamically mediated in the setting of poor oral intake/volume contraction and hypercalcemia.  I will send off for a urinalysis and urine electrolytes and order renal ultrasound.  Will begin isotonic normal saline for volume resuscitation and manage hypercalcemia that should further help renal functioning. Avoid nephrotoxic medications including NSAIDs and iodinated intravenous contrast exposure unless the latter is absolutely indicated.  Preferred narcotic agents for pain control are hydromorphone, fentanyl, and methadone. Morphine should not be used. Avoid Baclofen and avoid oral sodium phosphate and magnesium citrate based laxatives / bowel preps. Continue strict Input and Output monitoring. Will monitor the patient closely with you and intervene or adjust therapy as indicated by changes in clinical status/labs.  She does not have any indications  for dialysis at this time. 2.  Hypercalcemia: Likely associated with volume contraction with repeat calcium here in the emergency room again indicating severe hypercalcemia.  Begin isotonic fluids and start calcitonin with the goal to treat with intravenous bisphosphonate if calcium remains elevated with improving renal function following intravenous fluids.  There is low likelihood that her hypercalcemia is associated with immobility given the chronicity of the latter.  I will send off for a magnesium, PTH and urine calcium along with 25 hydroxy vitamin D level. 3.  Leukocytosis: No clear focus of infection however, multiple sources including sacral decubiti noted.  No evidence of respiratory infection and will check urinalysis. 4.  History of multiple sclerosis with spastic paraplegia.  Hazael Olveda K. 10/14/2020, 12:19 AM

## 2020-10-14 NOTE — ED Notes (Signed)
Crystal Tamala Julian (Sister) of Larr[place] called and ask for an update [phone]

## 2020-10-14 NOTE — ED Notes (Signed)
Pt cleaned up with full linen change 

## 2020-10-14 NOTE — Consult Note (Addendum)
[facility] Nurse Consult Note: Patient receiving care in [facility] 046 Reason for Consult: Sacral wounds and ostomy care Wound type: Right trochanter wound, staged on a previous hospitalization as a Stage 3, measures 3 x 3 x 0.1 pink granulation tissue with scant drainage. Multiple areas of healed wounds on the sacrum that are pink. Stage 1 on bilateral medial side of the knees.  Pressure Injury POA: Yes Dressing procedure/placement/frequency: Clean the right trochanter wound with NS, pat dry with sterile gauze or allow to air dry then place a small piece of Aquacel Advantage Kellie Simmering # [phone]) over this wound and secure with foam dressing. Change Aquacel daily. Foam dressing may be changed every 3 days unless soiled. Place a foam dressing over the wounds on the inside of the knees and a sacral foam dressing upside down on the coccyx area for protection.   Pressure Injury Prevention Bundle May use any that apply to this patient. Support surfaces (air mattress) chair cushion Kellie Simmering # [phone]) Heel offloading boots Kellie Simmering # [phone]) Turning and Positioning  Measures to reduce shear (draw sheet, knees up) Skin protection Products (Foam dressing) Moisture management products (Critic-Aid Barrier Cream (Purple top) Sween moisturizing lotion (Pink top in clean supply) Nutrition Management Protection for Medical Devices Routine Skin Assessment   Monitor the wound area(s) for worsening of condition such as: Signs/symptoms of infection, increase in size, development of or worsening of odor, development of pain, or increased pain at the affected locations.   Notify the medical team if any of these develop.  [place] Nurse ostomy consult note Patient has MS and is a resident in a SNF. She has had the urostomy since 2018 and is totally dependent on nursing care.  Stoma type/location: RLQ ileal conduit Stomal assessment/size: 3/4" red budded Peristomal assessment: maceration surrounding the stoma. Treatment options for  stomal/peristomal skin: 24M skin barrier wipes then a barrier ring. Output: clear yellow Ostomy pouching: 1pc. Urostomy pouch  Education provided: None  Enrolled patient in [place] program: No  1 piece urostomy pouching system  Urostomy pouch Kellie Simmering # [phone])            Kizzie Bane, Kellie Simmering # [phone])   Drainage bag adapter Kellie Simmering # [phone])  Urine collection bag Kellie Simmering # [phone])   Thank you for the consult. [place] nurse will not follow at this time.   Please re-consult the [place] team if needed. Cathlean Marseilles Tamala Julian, MSN, RN, [place], Lysle Pearl, [facility] Wound Treatment Associate Pager [phone]

## 2020-10-14 NOTE — Progress Notes (Addendum)
[facility] Progress Note    Assessment/ Plan:   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   1. Acute kidney Injury- suspect 2/2 volume contraction and hypercalcemia. Urine studies pending. 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 Cr 3.08 on admission, 2.88 this morning. Continue fluid resuscitation with normal saline, will repeat 1L NS bolus and then continue NS 200 cc/hr. Trend BMP, avoid nephrotoxic agents and monitor ins and outs.  2. Hypercalcemia- likely associated with volume contraction. PTH, urine calcium and 25 hydroxy vitamin D pending. Started on isotonic fluids and calcitonin. Calcium remains >15, plan to treat with IV bisphosphonate tomorrow if renal function continues to improve.   3. Hypernatremia- also likely in the setting of volume contraction/free water deficit. Urine studies pending. Continue isotonic fluids.  4. Leukocytosis- No clear source of infection. Although does have multiple pressure ulcers. CBC and UA pending.  5. Multiple sclerosis- hold baclofen and tizanidine in the setting of AKI. 6. History of hypotension- holding midodrine.  7. Hypothyroidism- on levothyroxine 100 mcg.  Subjective:   Patient reports feeling well this morning.  Denies any headaches, visual changes, nausea, vomiting or abdominal pain. She has no complaints this morning. Requesting her glasses. States that she has been residing at [facility] for very long time.  She does have a son who lives in [place].   Objective:   BP 113/66   Pulse 97   Temp (!) 97.5 F (36.4 C) (Axillary)   Resp 18   SpO2 98%   Intake/Output Summary (Last 24  hours) at 10/14/2020 [id number] Last data filed at 10/14/2020 0151 Gross per 24 hour  Intake 1000 ml  Output --  Net 1000 ml   Weight change:   Physical Exam: Gen: cachectic appearing, NAD  CVS: RRR, no murmrus Resp: CTA bilaterally, normal effort  Abd: soft, nontender, RLQ urostomy bag Ext: lower extremity contractures, multiple skin tears noted  Neuro: alert and oriented to self and place  Imaging: US RENAL  Result Date: 10/14/2020 CLINICAL DATA:  Acute kidney injury EXAM: RENAL / URINARY TRACT ULTRASOUND COMPLETE COMPARISON:  None. FINDINGS: Right Kidney: Renal measurements: 11.4 x 5.2 x 5.0 cm = volume: 153 mL. Increased parenchymal echogenicity. 9 mm lower pole calculus. Moderate hydronephrosis. Left Kidney: Renal measurements: 10.0 x 4.8 x 2.9 cm = volume: 74 mL. Poorly visualized. Increased parenchymal echogenicity. Moderate hydronephrosis. Bladder: Not visualized due to colostomy. Other: None. IMPRESSION: Moderate bilateral hydronephrosis. 9 mm right lower pole renal calculus. Increased parenchymal echogenicity, suggesting medical renal disease, with suspected left renal atrophy (although poorly visualized. Bladder not discretely visualized. Electronically Signed   By: Julian Hy M.D.   On: 10/14/2020 01:13    Labs: BMET Recent Labs  Lab 10/13/20 2139 10/14/20 0015 10/14/20 0215  NA 148* 146* 147*  K 3.5 4.0 3.7  CL 108 104 111  CO2 31 33* 30  GLUCOSE 101* 86 82  BUN 114* 105* 106*  CREATININE 3.08* 2.86* 2.88*  CALCIUM >15.0* >15.0* >15.0*  PHOS  --  2.3* 2.6   CBC Recent Labs  Lab 10/13/20 2139  WBC 12.7*  NEUTROABS 10.7*  HGB 11.2*  HCT 36.3  MCV 95.3  PLT 408*    Medications:     calcitonin  200 Units Subcutaneous BID      Bryauna Byrum, DO PGY-3 IMTS 10/14/2020, 9:04 AM

## 2020-10-15 LAB — COMPREHENSIVE METABOLIC PANEL
ALT: <5 U/L (ref 0–44)
AST: 23 U/L (ref 15–41)
Albumin: 2.5 g/dL — ABNORMAL LOW (ref 3.5–5.0)
Alkaline Phosphatase: 54 U/L (ref 38–126)
Anion gap: 12 (ref 5–15)
BUN: 59 mg/dL — ABNORMAL HIGH (ref 6–20)
CO2: 25 mmol/L (ref 22–32)
Calcium: 11.2 mg/dL — ABNORMAL HIGH (ref 8.9–10.3)
Chloride: 111 mmol/L (ref 98–111)
Creatinine, Ser: 1.89 mg/dL — ABNORMAL HIGH (ref 0.44–1.00)
GFR, Estimated: 35 mL/min — ABNORMAL LOW (ref >60)
Glucose, Bld: 77 mg/dL (ref 70–99)
Potassium: 3 mmol/L — ABNORMAL LOW (ref 3.5–5.1)
Sodium: 148 mmol/L — ABNORMAL HIGH (ref 135–145)
Total Bilirubin: 0.5 mg/dL (ref 0.3–1.2)
Total Protein: 5.3 g/dL — ABNORMAL LOW (ref 6.5–8.1)

## 2020-10-15 LAB — CBC
HCT: 27.7 % — ABNORMAL LOW (ref 36.0–46.0)
Hemoglobin: 8.5 g/dL — ABNORMAL LOW (ref 12.0–15.0)
MCH: 29.2 pg (ref 26.0–34.0)
MCHC: 30.7 g/dL (ref 30.0–36.0)
MCV: 95.2 fL (ref 80.0–100.0)
Platelets: 322 10*3/uL (ref 150–400)
RBC: 2.91 MIL/uL — ABNORMAL LOW (ref 3.87–5.11)
RDW: 13.7 % (ref 11.5–15.5)
WBC: 8.5 10*3/uL (ref 4.0–10.5)
nRBC: 0 % (ref 0.0–0.2)

## 2020-10-15 LAB — MAGNESIUM: Magnesium: 1 mg/dL — ABNORMAL LOW (ref 1.7–2.4)

## 2020-10-15 LAB — PARATHYROID HORMONE, INTACT (NO CA): PTH: 5 pg/mL — ABNORMAL LOW (ref 15–65)

## 2020-10-15 LAB — CALCIUM, URINE, RANDOM: Calcium, Ur: 23.6 mg/dL

## 2020-10-15 MED ORDER — MAGNESIUM SULFATE 2 GM/50ML IV SOLN
2.0000 g | Freq: Once | INTRAVENOUS | Status: AC
  Administered 2020-10-15: 2 g via INTRAVENOUS
  Filled 2020-10-15: qty 50

## 2020-10-15 MED ORDER — POTASSIUM CHLORIDE CRYS ER 20 MEQ PO TBCR
40.0000 meq | EXTENDED_RELEASE_TABLET | Freq: Two times a day (BID) | ORAL | Status: DC
  Administered 2020-10-15 – 2020-10-19 (×9): 40 meq via ORAL
  Filled 2020-10-15 (×9): qty 2

## 2020-10-15 MED ORDER — SODIUM CHLORIDE 0.9 % IV SOLN: INTRAVENOUS | Status: AC

## 2020-10-15 NOTE — ED Notes (Signed)
Pt vomited her supper changed linen, gown and gave brief bath, pt did have a large stool pass as well during this clean

## 2020-10-15 NOTE — Progress Notes (Addendum)
PROGRESS NOTE    Deanna Baldwin  [id number] DOB: 04/30/1975 DOA: 10/13/2020 PCP: [name], MD   Chief Complaint  Patient presents with   Medical Evaluation   Abnormal Lab    Brief Narrative: 28 old female with history of hypothyroidism, MS with contractures of her legs neuromuscular dysfunctions of bladder, status post ostomy for bowel movement, multiple pressure ulcers, with ambulatory dysfunction/sedentary, history of hypotension on midodrine, hypothyroidism from [facility] sent to the ED for electrolyte abnormality Apparently patient was getting IV antibiotics for change in mental status at the facility. In the ED had leukocytosis severely elevated calcium level creatinine 3.0 EKG NSR, nephro was consulted and patient was admitted.  Subjective: Large bm during cleaning this am Also vomited this am. Patient feels cold this morning.  Assessment & Plan: Acute kidney injury: peak 3.0,Likely hemodynamic mediated with poor oral intake/volume contraction and hypercalcemia.Appreciate nephrology input on board continue fluid hydration, renal function nicely improving.  Monitor intake output. Net IO Since Admission: 3,140.97 mL [10/15/20 1013]  Recent Labs  Lab 10/13/20 2139 10/14/20 0015 10/14/20 0215 10/15/20 0449  BUN 114* 105* 106* 59*  CREATININE 3.08* 2.86* 2.88* 1.89*   Intake/Output Summary (Last 24 hours) at 10/15/2020 1013 Last data filed at 10/14/2020 1433 Gross per 24 hour  Intake 2140.97 ml  Output --  Net 2140.97 ml     Severe hypercalcemia: Suspecting in the setting of volume contraction.  Appreciate nephrology input managing with isotonic hydration and calcitonin - calcium  >15 now at 11, continue IV fluid hydration PTH is appropriately suppressed, urine calcium 25-hydroxy vitamin D pending. Renal ultrasound reviewed.plan per Nephro.  Hypomagnesemia/hypokalemia: Repleted with magnesium sulfate 4 g, oral potassium.  Hypernatremia:Again suspecting  from free water deficit.  Managing with IV fluids Recent Labs  Lab 10/13/20 2139 10/14/20 0015 10/14/20 0215 10/15/20 0449  NA 148* 146* 147* 148*    Leukocytosis:Without clear source of infection, likely from hemoconcentration currently resolved. Does have multiple pressure ulcers-continue wound care. 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  We will hold off on MiraLAX.  Had a bowel movement this morning having liquidy ostomy output. Hypotension on midodrine. Hypothyroidism-continue her Synthroid at current dose although TSH slightly up 5.8, 3 years ago was 37.  Would suggest rechecking TSH in 4 weeks to adjust Synthroid dose follow-up at the facility.  Moderate bilateral hydronephrosis/9 mm right lower pole calculus in the setting of neuromuscular dysfunction of bladder. Voiding.  Anemia likely from chronic disease continue iron supplement.  Monitor hemoglobin Recent Labs  Lab 10/13/20 2139 10/14/20 1418 10/15/20 0449  HGB 11.2* 9.4* 8.5*  HCT 36.3 32.0* 27.7*    Low albumin/suspecting malnutrition/cachectic-augment diet.  Diet Order             Diet renal with fluid restriction Fluid restriction: Other (see comments); Room service appropriate? Yes; Fluid consistency: Thin  Diet effective now                  Patient's There is no height or weight on file to calculate BMI.   DVT prophylaxis: heparin injection 5,000 Units Start: 10/14/20 1400 Place TED hose Start: 10/14/20 1030 Code Status:   Code Status: Full Code  Family Communication: plan of care discussed with patient at bedside. Status is: Inpatient  Remains inpatient appropriate because:IV treatments appropriate due to intensity of illness or  inability to take PO and Inpatient level of care appropriate due to severity of illness Dispo: The patient is from: SNF              Anticipated d/c is to: SNF              Patient currently is not medically stable to d/c.   Difficult to place patient No       Unresulted Labs (From admission, onward)     Start     Ordered   10/15/20 0500  Comprehensive metabolic panel  Daily,   R      10/14/20 0801   10/15/20 0500  CBC  Daily,   R      10/14/20 0801   10/15/20 0500  Calcium, ionized  Tomorrow morning,   R        10/14/20 1142   10/14/20 0100  VITAMIN D 25 Hydroxy (Vit-D Deficiency, Fractures)  Once,   R        10/14/20 0100   10/14/20 0039  Calcium, urine, random  Once,   STAT        10/14/20 0039   10/13/20 1938  Urinalysis, Routine w reflex microscopic Urine, Unspecified Source  Once,   STAT        10/13/20 1937           Medications reviewed:  Scheduled Meds:  calcitonin  200 Units Subcutaneous BID   ferrous sulfate  325 mg Oral Q breakfast   heparin  5,000 Units Subcutaneous Q8H   levothyroxine  100 mcg Oral QAC breakfast   multivitamin with minerals  1 tablet Oral Daily   pantoprazole  40 mg Oral Daily   vitamin B-12  500 mcg Oral Daily   Continuous Infusions: Consultants:see note  Procedures:see note Antimicrobials: Anti-infectives (From admission, onward)    None      Culture/Microbiology    Component Value Date/Time   SDES BLOOD LEFT WRIST 08/02/2017 0003   SPECREQUEST  08/02/2017 0003    BOTTLES DRAWN AEROBIC AND ANAEROBIC Blood Culture results may not be optimal due to an excessive volume of blood received in culture bottles   CULT  08/02/2017 0003    NO GROWTH 5 DAYS Performed at [facility], [place] [address] [place], [place] [address]    REPTSTATUS 08/07/2017 FINAL 08/02/2017 0003    Other culture-see note  Objective: Vitals: Today's Vitals   10/15/20 0500 10/15/20 0530 10/15/20 0600 10/15/20 0602  BP: 128/77 127/78 133/83    Pulse: (!) 109 (!) 108 (!) 110   Resp:   19   Temp:      TempSrc:      SpO2: 97% 97% 97%   PainSc:    0-No pain    Intake/Output Summary (Last 24 hours) at 10/15/2020 0810 Last data filed at 10/14/2020 1433 Gross per 24 hour  Intake 2140.97 ml  Output --  Net 2140.97 ml   There were no vitals filed for this visit. Weight change:   Intake/Output from previous day: 09/13 0701 - 09/14 0700 In: 2141 [I.V.:1141; IV Piggyback:1000] Out: -  Intake/Output this shift: No intake/output data recorded. There were no vitals filed for this visit. Examination: General exam: Alert awake pleasant, cachectic,older than stated age, weak appearing. HEENT:Oral mucosa moist, Ear/Nose WNL grossly,dentition normal. Respiratory system: bilaterally clear, no use of accessory muscle, non tender. Cardiovascular system: S1 & S2 +,No JVD. Gastrointestinal  system: Abdomen soft, NT,ND, BS+. Nervous System:Alert, awake, moving extremities-contractures present/fetal position Extremities: mild ankle edema, Skin: No rashes,no icterus. MSK: Normal muscle bulk,tone, power  Data Reviewed: I have personally reviewed following labs and imaging studies CBC: Recent Labs  Lab 10/13/20 2139 10/14/20 1418 10/15/20 0449  WBC 12.7* 10.2 8.5  NEUTROABS 10.7*  --   --   HGB 11.2* 9.4* 8.5*  HCT 36.3 32.0* 27.7*  MCV 95.3 99.7 95.2  PLT 408* 351 [id number]   Basic Metabolic Panel: Recent Labs  Lab 10/13/20 2139 10/14/20 0015 10/14/20 0215 10/15/20 0449  NA 148* 146* 147* 148*  K 3.5 4.0 3.7 3.0*  CL 108 104 111 111  CO2 31 33* 30 25  GLUCOSE 101* 86 82 77  BUN 114* 105* 106* 59*  CREATININE 3.08* 2.86* 2.88* 1.89*  CALCIUM >15.0* >15.0* >15.0* 11.2*  MG  --  1.8  --  1.0*  PHOS  --  2.3* 2.6  --    GFR: CrCl cannot be calculated (Unknown ideal weight.). Liver Function Tests: Recent Labs  Lab 10/13/20 2139 10/14/20 0015 10/14/20 0215 10/15/20 0449  AST 23 23  --  23  ALT 10 9  --  <5  ALKPHOS 69  73  --  54  BILITOT 0.6 0.7  --  0.5  PROT 7.0 7.3  --  5.3*  ALBUMIN 3.3* 3.4* 3.0* 2.5*   No results for input(s): LIPASE, AMYLASE in the last 168 hours. No results for input(s): AMMONIA in the last 168 hours. Coagulation Profile: No results for input(s): INR, PROTIME in the last 168 hours. Cardiac Enzymes: No results for input(s): CKTOTAL, CKMB, CKMBINDEX, TROPONINI in the last 168 hours. BNP (last 3 results) No results for input(s): PROBNP in the last 8760 hours. HbA1C: No results for input(s): HGBA1C in the last 72 hours. CBG: No results for input(s): GLUCAP in the last 168 hours. Lipid Profile: No results for input(s): CHOL, HDL, LDLCALC, TRIG, CHOLHDL, LDLDIRECT in the last 72 hours. Thyroid Function Tests: Recent Labs    10/14/20 1418  TSH 5.832*   Anemia Panel: No results for input(s): VITAMINB12, FOLATE, FERRITIN, TIBC, IRON, RETICCTPCT in the last 72 hours. Sepsis Labs: No results for input(s): PROCALCITON, LATICACIDVEN in the last 168 hours.  Recent Results (from the past 240 hour(s))  Resp Panel by RT-PCR (Flu A&B, Covid) Nasopharyngeal Swab     Status: None   Collection Time: 10/13/20  8:38 PM   Specimen: Nasopharyngeal Swab; Nasopharyngeal(NP) swabs in vial transport medium  Result Value Ref Range Status   SARS Coronavirus 2 by RT PCR NEGATIVE NEGATIVE Final    Comment: (NOTE) SARS-CoV-2 target nucleic acids are NOT DETECTED.  The SARS-CoV-2 RNA is generally detectable in upper respiratory specimens during the acute phase of infection. The lowest concentration of SARS-CoV-2 viral copies this assay can detect is 138 copies/mL. A negative result does not preclude SARS-Cov-2 infection and should not be used as the sole basis for treatment or other patient management decisions. A negative result may occur with  improper specimen collection/handling, submission of specimen other than nasopharyngeal swab, presence of viral mutation(s) within the areas targeted  by this assay, and inadequate number of viral copies(<138 copies/mL). A negative result must be combined with clinical observations, patient history, and epidemiological information. The expected result is Negative.  Fact Sheet for Patients:  EntrepreneurPulse.com.au  Fact Sheet for Healthcare Providers:  IncredibleEmployment.be  This test is no t yet approved or cleared by the [place] FDA  and  has been authorized for detection and/or diagnosis of SARS-CoV-2 by FDA under an Emergency Use Authorization (EUA). This EUA will remain  in effect (meaning this test can be used) for the duration of the COVID-19 declaration under Section 564(b)(1) of the Act, 21 U.S.C.section 360bbb-3(b)(1), unless the authorization is terminated  or revoked sooner.       Influenza A by PCR NEGATIVE NEGATIVE Final   Influenza B by PCR NEGATIVE NEGATIVE Final    Comment: (NOTE) The Xpert Xpress SARS-CoV-2/FLU/RSV plus assay is intended as an aid in the diagnosis of influenza from Nasopharyngeal swab specimens and should not be used as a sole basis for treatment. Nasal washings and aspirates are unacceptable for Xpert Xpress SARS-CoV-2/FLU/RSV testing.  Fact Sheet for Patients: EntrepreneurPulse.com.au  Fact Sheet for Healthcare Providers: IncredibleEmployment.be  This test is not yet approved or cleared by the [place] FDA and has been authorized for detection and/or diagnosis of SARS-CoV-2 by FDA under an Emergency Use Authorization (EUA). This EUA will remain in effect (meaning this test can be used) for the duration of the COVID-19 declaration under Section 564(b)(1) of the Act, 21 U.S.C. section 360bbb-3(b)(1), unless the authorization is terminated or revoked.  Performed at [facility], [place] [address] [place], [place] [address]      Radiology Studies: US RENAL  Result Date: 10/14/2020 CLINICAL DATA:   Acute kidney injury EXAM: RENAL / URINARY TRACT ULTRASOUND COMPLETE COMPARISON:  None. FINDINGS: Right Kidney: Renal measurements: 11.4 x 5.2 x 5.0 cm = volume: 153 mL. Increased parenchymal echogenicity. 9 mm lower pole calculus. Moderate hydronephrosis. Left Kidney: Renal measurements: 10.0 x 4.8 x 2.9 cm = volume: 74 mL. Poorly visualized. Increased parenchymal echogenicity. Moderate hydronephrosis. Bladder: Not visualized due to colostomy. Other: None. IMPRESSION: Moderate bilateral hydronephrosis. 9 mm right lower pole renal calculus. Increased parenchymal echogenicity, suggesting medical renal disease, with suspected left renal atrophy (although poorly visualized. Bladder not discretely visualized. Electronically Signed   By: Julian Hy M.D.   On: 10/14/2020 01:13     LOS: 1 day   Antonieta Pert, MD [facility]  10/15/2020, 8:10 AM

## 2020-10-15 NOTE — Progress Notes (Addendum)
[facility] Progress Note    Assessment/ Plan:   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   1. Acute kidney Injury- suspect 2/2 volume contraction and hypercalcemia. Urine Cr and Na wnl. 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 Per chart review, she has a history of proteinuria, unclear etiology, UA pending. Cr down trending, 1.89 from 3.08 on admission.  Continue IVF. Trend BMP, avoid nephrotoxic agents and monitor ins and outs.  2. Hypercalcemia- likely associated with volume contraction. PTH appropriately low, can exclude primary hyperparathyroidism. Urine calcium and 25 hydroxy vitamin D pending. Started on isotonic fluids and calcitonin, will discontinue today. Calcium improving, 11.2 today. Continue IVF, 75 cc/hr. Hold off on bisphosphonate for now as calcium is improving. 3. Electrolyte disturbances- hypokalemia and hypomagnesemia, will replete and trend.  4. Hypernatremia- also likely in the setting of volume contraction/free water deficit. Stable, Na 148. Urine Na wnl. Continue isotonic fluids.  5. Leukocytosis- Resolved.  6. Multiple sclerosis- hold baclofen and tizanidine in the setting of AKI. 7. History of hypotension- holding midodrine.  8. Hypothyroidism- on levothyroxine 100 mcg. 9. Anemia of chronic disease- Hgb 8.5, downtrending but near baseline of ~8-9. Of note all cell lines decreased, likely a degree of dilution. Trend CBC. Continue iron supplement.  Subjective:   Patient reports feeling well this morning.  She reports one episode of emesis and some nausea and abdominal pain. Denies any headaches or changes in vision.     Objective:   BP 133/83   Pulse (!) 110   Temp 98.2 F (36.8 C)   Resp 19   SpO2 97%   Intake/Output Summary (Last 24 hours) at 10/15/2020 0915 Last data filed at 10/14/2020 1433 Gross per 24 hour  Intake 2140.97 ml  Output --  Net 2140.97 ml   Weight change:   Physical Exam: Gen: cachectic appearing, NAD  CVS: tachycardic, regular rhythm, no murmrus Resp: CTA bilaterally, normal effort  Abd: soft, epigastric tenderness, RLQ urostomy bag Ext: lower extremity contractures, multiple skin tears noted  Neuro: alert and oriented to self and place  Imaging: US RENAL  Result Date: 10/14/2020 CLINICAL DATA:  Acute kidney injury EXAM: RENAL / URINARY TRACT ULTRASOUND COMPLETE COMPARISON:  None. FINDINGS: Right Kidney: Renal measurements: 11.4 x 5.2 x 5.0 cm = volume: 153 mL. Increased parenchymal echogenicity. 9 mm lower pole calculus. Moderate hydronephrosis. Left Kidney: Renal measurements: 10.0 x 4.8 x 2.9 cm = volume: 74 mL. Poorly visualized. Increased parenchymal echogenicity. Moderate hydronephrosis. Bladder: Not visualized due to colostomy. Other: None. IMPRESSION: Moderate bilateral hydronephrosis. 9 mm right lower pole renal calculus. Increased parenchymal echogenicity, suggesting medical renal disease, with suspected left renal atrophy (although poorly visualized. Bladder not discretely visualized. Electronically Signed   By: Deanna Baldwin M.D.   On: 10/14/2020 01:13    Labs: BMET Recent Labs  Lab 10/13/20 2139 10/14/20 0015 10/14/20 0215 10/15/20 0449  NA 148* 146* 147* 148*  K 3.5 4.0 3.7 3.0*  CL 108 104 111 111  CO2 31 33* 30 25  GLUCOSE 101* 86 82 77  BUN 114* 105* 106* 59*  CREATININE 3.08* 2.86* 2.88* 1.89*  CALCIUM >15.0* >15.0* >15.0* 11.2*  PHOS  --  2.3* 2.6  --    CBC Recent Labs  Lab 10/13/20 2139 10/14/20 1418 10/15/20 0449  WBC 12.7* 10.2 8.5  NEUTROABS 10.7*  --   --   HGB 11.2* 9.4* 8.5*  HCT 36.3 32.0* 27.7*  MCV 95.3 99.7 95.2  PLT  408* 351 322    Medications:     calcitonin  200 Units Subcutaneous BID   ferrous sulfate  325 mg Oral Q breakfast   heparin  5,000 Units Subcutaneous Q8H   levothyroxine  100 mcg Oral QAC breakfast   multivitamin with minerals  1 tablet Oral Daily   pantoprazole  40 mg Oral Daily   vitamin B-12  500 mcg Oral Daily      Deanna Herskowitz, DO PGY-3 IMTS 10/15/2020, 9:15 AM

## 2020-10-15 NOTE — ED Notes (Signed)
Urostomy bag emptied of 849m amber urine. Patient cleansed of loose stool and new brief placed. Bed linen and gown changed. Patient repositioned with multiple pillows to prevent further skin breakdown. No complaints.

## 2020-10-16 LAB — COMPREHENSIVE METABOLIC PANEL
ALT: <5 U/L (ref 0–44)
AST: 22 U/L (ref 15–41)
Albumin: 2.6 g/dL — ABNORMAL LOW (ref 3.5–5.0)
Alkaline Phosphatase: 68 U/L (ref 38–126)
Anion gap: 8 (ref 5–15)
BUN: 37 mg/dL — ABNORMAL HIGH (ref 6–20)
CO2: 23 mmol/L (ref 22–32)
Calcium: 10.5 mg/dL — ABNORMAL HIGH (ref 8.9–10.3)
Chloride: 121 mmol/L — ABNORMAL HIGH (ref 98–111)
Creatinine, Ser: 1.43 mg/dL — ABNORMAL HIGH (ref 0.44–1.00)
GFR, Estimated: 46 mL/min — ABNORMAL LOW (ref >60)
Glucose, Bld: 88 mg/dL (ref 70–99)
Potassium: 3.7 mmol/L (ref 3.5–5.1)
Sodium: 152 mmol/L — ABNORMAL HIGH (ref 135–145)
Total Bilirubin: 0.3 mg/dL (ref 0.3–1.2)
Total Protein: 5.7 g/dL — ABNORMAL LOW (ref 6.5–8.1)

## 2020-10-16 LAB — CBC
HCT: 28 % — ABNORMAL LOW (ref 36.0–46.0)
Hemoglobin: 8.9 g/dL — ABNORMAL LOW (ref 12.0–15.0)
MCH: 29.7 pg (ref 26.0–34.0)
MCHC: 31.8 g/dL (ref 30.0–36.0)
MCV: 93.3 fL (ref 80.0–100.0)
Platelets: 334 10*3/uL (ref 150–400)
RBC: 3 MIL/uL — ABNORMAL LOW (ref 3.87–5.11)
RDW: 13.8 % (ref 11.5–15.5)
WBC: 10.4 10*3/uL (ref 4.0–10.5)
nRBC: 0 % (ref 0.0–0.2)

## 2020-10-16 LAB — VITAMIN D 25 HYDROXY (VIT D DEFICIENCY, FRACTURES)

## 2020-10-16 LAB — CALCIUM, IONIZED: Calcium, Ionized, Serum: 6.8 mg/dL — ABNORMAL HIGH (ref 4.5–5.6)

## 2020-10-16 LAB — MAGNESIUM: Magnesium: 2 mg/dL (ref 1.7–2.4)

## 2020-10-16 MED ORDER — DEXTROSE-NACL 5-0.45 % IV SOLN: INTRAVENOUS | Status: AC

## 2020-10-16 MED ORDER — SODIUM CHLORIDE 0.9 % IV SOLN
60.0000 mg | Freq: Once | INTRAVENOUS | Status: AC
  Administered 2020-10-16: 60 mg via INTRAVENOUS
  Filled 2020-10-16: qty 20

## 2020-10-16 MED ORDER — ZOLEDRONIC ACID 4 MG/5ML IV CONC: 3.0000 mg | Freq: Once | INTRAVENOUS | Status: DC

## 2020-10-16 NOTE — Plan of Care (Signed)
°  Problem: Education: °Goal: Knowledge of General Education information will improve °Description: Including pain rating scale, medication(s)/side effects and non-pharmacologic comfort measures °Outcome: Progressing °  °Problem: Nutrition: °Goal: Adequate nutrition will be maintained °Outcome: Progressing °  °Problem: Skin Integrity: °Goal: Risk for impaired skin integrity will decrease °Outcome: Progressing °  °

## 2020-10-16 NOTE — Progress Notes (Signed)
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 In the ED had leukocytosis severely elevated calcium level creatinine 3.0 EKG NSR, nephro was consulted and patient was admitted.  Managed with aggressive IV hydration calcitonin serial calcium monitoring.  PTH was appropriately suppressed, vitamin D, PTH RP and urine calcium ordered.  Subjective: Seen this morning.  Pleasant.  Not in distress. No acute events overnight.  Sodium and chloride uptrending renal function significantly better Calcium down to 10.5 w/ albumin 2.6  Assessment & Plan: Acute kidney injury: peak 3.0,Likely hemodynamic mediated with poor oral intake/volume contraction and hypercalcemia.Appreciate nephrology input on board continue fluid hydration-mild hypernatremia, monitor renal function intake output Net IO Since Admission: 1,269.82 mL [10/16/20 0734]  Recent Labs  Lab 10/13/20 2139 10/14/20 0015 10/14/20 0215 10/15/20 0449 10/16/20 0318  BUN 114* 105* 106* 59* 37*  CREATININE 3.08* 2.86* 2.88* 1.89* 1.43*    Intake/Output Summary (Last 24 hours) at 10/16/2020 0734 Last data filed at 10/16/2020 0600 Gross per 24 hour  Intake 78.85 ml  Output 1950 ml  Net -1871.15 ml    Severe hypercalcemia: With hypoalbuminemia, corrected calcium 11.6. suspecting in the setting of volume contraction. Appreciate nephrology input managing with isotonic hydration and  calcitonin x 4 doses calcium  >15 now at 11.6 ( corrected)>dosing  BISPHOSPHONATES  X1 today. Marland KitchenPTH is appropriately suppressed, urine calcium 25-hydroxy vitamin D,PTH RP pending. Renal ultrasound reviewed.plan per Nephro.  Hypomagnesemia/hypokalemia:Replete  Hypernatremia:Again suspecting from free water deficit.  Encourage oral intake.  IV fluids deferred to nephrology Recent Labs  Lab 10/13/20 2139 10/14/20 0015 10/14/20 0215 10/15/20 0449 10/16/20 0318  NA 148* 146* 147* 148* 152*     Leukocytosis: Resolved.  No evidence of infection.  Suspect hemoconcentration .Does have multiple pressure ulcers-continue wound care. 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 Hypothyroidism-continue her Synthroid at current dose although TSH slightly up 5.8, 3 years ago was 37.  Would suggest rechecking TSH in 4 weeks to adjust Synthroid dose follow-up at the facility.  Moderate bilateral hydronephrosis/9 mm right lower pole calculus in the setting of neuromuscular dysfunction of bladder. Voiding.  Anemia likely from chronic disease continue iron supplement.  Monitor hemoglobin Recent Labs  Lab 10/13/20 2139 10/14/20 1418 10/15/20 0449 10/16/20 0318  HGB 11.2* 9.4* 8.5* 8.9*  HCT 36.3 32.0* 27.7* 28.0*     Low albumin/suspecting malnutrition/cachectic-augment diet.  Diet Order             Diet renal with fluid restriction Fluid restriction: Other (see comments); Room service appropriate? Yes; Fluid consistency: Thin  Diet effective now                  Patient's Body mass index is 20.82 kg/m.   DVT prophylaxis: heparin injection 5,000 Units  Start: 10/14/20 1400 Place TED hose Start: 10/14/20  1030 Code Status:   Code Status: Full Code  Family Communication: plan of care discussed with patient at bedside. Status is: Inpatient Remains inpatient appropriate because:IV treatments appropriate due to intensity of illness or inability to take PO and Inpatient level of care appropriate due to severity of illness Dispo: The patient is from: SNF              Anticipated d/c is to: SNF              Patient currently is not medically stable to d/c.   Difficult to place patient No  Unresulted Labs (From admission, onward)     Start     Ordered   10/15/20 1124  PTH-related peptide  Once,   STAT        10/15/20 1123   10/15/20 0500  Comprehensive metabolic panel  Daily,   R      10/14/20 0801   10/15/20 0500  CBC  Daily,   R      10/14/20 0801   10/15/20 0500  Calcium, ionized  Tomorrow morning,   R        10/14/20 1142   10/14/20 0100  VITAMIN D 25 Hydroxy (Vit-D Deficiency, Fractures)  Once,   R        10/14/20 0100   10/13/20 1938  Urinalysis, Routine w reflex microscopic Urine, Unspecified Source  Once,   STAT        10/13/20 1937           Medications reviewed:  Scheduled Meds:  ferrous sulfate  325 mg Oral Q breakfast   heparin  5,000 Units Subcutaneous Q8H   levothyroxine  100 mcg Oral QAC breakfast   multivitamin with minerals  1 tablet Oral Daily   pantoprazole  40 mg Oral Daily   potassium chloride  40 mEq Oral BID   vitamin B-12  500 mcg Oral Daily   Continuous Infusions: Consultants:see note  Procedures:see note Antimicrobials: Anti-infectives (From admission, onward)    None      Culture/Microbiology    Component Value Date/Time   SDES BLOOD LEFT WRIST 08/02/2017 0003   SPECREQUEST  08/02/2017 0003    BOTTLES DRAWN AEROBIC AND ANAEROBIC Blood Culture results may not be optimal due to an excessive volume of blood received in culture bottles   CULT  08/02/2017 0003    NO GROWTH 5 DAYS Performed at [facility], [place] [address], [place],  [place] [address]    REPTSTATUS 08/07/2017 FINAL 08/02/2017 0003    Other culture-see note  Objective: Vitals: Today's Vitals   10/15/20 2043 10/15/20 2047 10/15/20 2322 10/16/20 0333  BP: 120/79  124/70 125/72  Pulse: (!) 114  (!) 113 (!) 113  Resp: '16  18 18  '$ Temp: (!) 97.5 F (36.4 C)  98.3 F (36.8 C) 98.3 F (36.8 C)  TempSrc: Oral  Oral Oral  SpO2: 98%  97% 97%  Weight:  46.8 kg    Height:  '4\' 11"'$  (1.499 m)    PainSc:   0-No pain     Intake/Output Summary (Last 24 hours) at 10/16/2020 0734 Last data filed at 10/16/2020 0600 Gross per 24 hour  Intake 78.85 ml  Output 1950 ml  Net -1871.15 ml    Filed Weights   10/15/20 2047  Weight: 46.8 kg   Weight change:   Intake/Output from previous day: 09/14 0701 - 09/15 0700 In: 78.9 [IV  Piggyback:78.9] Out: 1950 [id number] Intake/Output this shift: No intake/output data recorded. Filed Weights   10/15/20 2047  Weight: 46.8 kg   Examination: General exam: Alert awake poor historian, thin, frail, older than stated age, weak appearing. HEENT:Oral mucosa moist, Ear/Nose WNL grossly, dentition normal. Respiratory system: bilaterally clear breath sounds, no use of accessory muscle Cardiovascular system: S1 & S2 +, No JVD,. Gastrointestinal system: Abdomen soft, ileostomy in place w/ clear urine. NT,ND, BS+ Nervous System:Alert, awake, with contractures Extremities: no edema, contractures present distal peripheral pulses palpable.  Skin: No rashes,no icterus. MSK: Thin l muscle bulk,tone, power   Data Reviewed: I have personally reviewed following labs and imaging studies CBC: Recent Labs  Lab 10/13/20 2139 10/14/20 1418 10/15/20 0449 10/16/20 0318  WBC 12.7* 10.2 8.5 10.4  NEUTROABS 10.7*  --   --   --   HGB 11.2* 9.4* 8.5* 8.9*  HCT 36.3 32.0* 27.7* 28.0*  MCV 95.3 99.7 95.2 93.3  PLT 408* 351 322 [id number]    Basic Metabolic Panel: Recent Labs  Lab 10/13/20 2139 10/14/20 0015 10/14/20 0215 10/15/20 0449  10/16/20 0318  NA 148* 146* 147* 148* 152*  K 3.5 4.0 3.7 3.0* 3.7  CL 108 104 111 111 121*  CO2 31 33* '30 25 23  '$ GLUCOSE 101* 86 82 77 88  BUN 114* 105* 106* 59* 37*  CREATININE 3.08* 2.86* 2.88* 1.89* 1.43*  CALCIUM >15.0* >15.0* >15.0* 11.2* 10.5*  MG  --  1.8  --  1.0*  --   PHOS  --  2.3* 2.6  --   --     GFR: Estimated Creatinine Clearance: 34.2 mL/min (A) (by C-G formula based on SCr of 1.43 mg/dL (H)). Liver Function Tests: Recent Labs  Lab 10/13/20 2139 10/14/20 0015 10/14/20 0215 10/15/20 0449 10/16/20 0318  AST 23 23  --  23 22  ALT 10 9  --  <5 <5  ALKPHOS 69 73  --  54 68  BILITOT 0.6 0.7  --  0.5 0.3  PROT 7.0 7.3  --  5.3* 5.7*  ALBUMIN 3.3* 3.4* 3.0* 2.5* 2.6*    No results for input(s): LIPASE, AMYLASE in the last 168 hours. No results for input(s): AMMONIA in the last 168 hours. Coagulation Profile: No results for input(s): INR, PROTIME in the last 168 hours. Cardiac Enzymes: No results for input(s): CKTOTAL, CKMB, CKMBINDEX, TROPONINI in the last 168 hours. BNP (last 3 results) No results for input(s): PROBNP in the last 8760 hours. HbA1C: No results for input(s): HGBA1C in the last 72 hours. CBG: No results for input(s): GLUCAP in the last 168 hours. Lipid Profile: No results for input(s): CHOL, HDL, LDLCALC, TRIG, CHOLHDL, LDLDIRECT in the last 72 hours. Thyroid Function Tests: Recent Labs    10/14/20 1418  TSH 5.832*    Anemia Panel: No results for input(s): VITAMINB12, FOLATE, FERRITIN, TIBC, IRON, RETICCTPCT in the last 72 hours. Sepsis Labs: No results for input(s): PROCALCITON, LATICACIDVEN in the last 168 hours.  Recent Results (from the past 240 hour(s))  Resp Panel by RT-PCR (Flu A&B, Covid) Nasopharyngeal Swab     Status: None   Collection Time: 10/13/20  8:38 PM   Specimen: Nasopharyngeal Swab; Nasopharyngeal(NP) swabs in vial transport medium  Result Value Ref Range Status   SARS Coronavirus 2 by RT PCR NEGATIVE NEGATIVE  Final    Comment: (NOTE) SARS-CoV-2 target nucleic acids are NOT DETECTED.  The SARS-CoV-2 RNA is generally detectable in upper respiratory specimens during the acute  phase of infection. The lowest concentration of SARS-CoV-2 viral copies this assay can detect is 138 copies/mL. A negative result does not preclude SARS-Cov-2 infection and should not be used as the sole basis for treatment or other patient management decisions. A negative result may occur with  improper specimen collection/handling, submission of specimen other than nasopharyngeal swab, presence of viral mutation(s) within the areas targeted by this assay, and inadequate number of viral copies(<138 copies/mL). A negative result must be combined with clinical observations, patient history, and epidemiological information. The expected result is Negative.  Fact Sheet for Patients:  EntrepreneurPulse.com.au  Fact Sheet for Healthcare Providers:  IncredibleEmployment.be  This test is no t yet approved or cleared by the [place] FDA and  has been authorized for detection and/or diagnosis of SARS-CoV-2 by FDA under an Emergency Use Authorization (EUA). This EUA will remain  in effect (meaning this test can be used) for the duration of the COVID-19 declaration under Section 564(b)(1) of the Act, 21 U.S.C.section 360bbb-3(b)(1), unless the authorization is terminated  or revoked sooner.       Influenza A by PCR NEGATIVE NEGATIVE Final   Influenza B by PCR NEGATIVE NEGATIVE Final    Comment: (NOTE) The Xpert Xpress SARS-CoV-2/FLU/RSV plus assay is intended as an aid in the diagnosis of influenza from Nasopharyngeal swab specimens and should not be used as a sole basis for treatment. Nasal washings and aspirates are unacceptable for Xpert Xpress SARS-CoV-2/FLU/RSV testing.  Fact Sheet for Patients: EntrepreneurPulse.com.au  Fact Sheet for Healthcare  Providers: IncredibleEmployment.be  This test is not yet approved or cleared by the [place] FDA and has been authorized for detection and/or diagnosis of SARS-CoV-2 by FDA under an Emergency Use Authorization (EUA). This EUA will remain in effect (meaning this test can be used) for the duration of the COVID-19 declaration under Section 564(b)(1) of the Act, 21 U.S.C. section 360bbb-3(b)(1), unless the authorization is terminated or revoked.  Performed at [facility], [place] [address], [place], [place] [address]       Radiology Studies: No results found.   LOS: 2 days   Antonieta Pert, MD [facility]  10/16/2020, 7:34 AM

## 2020-10-16 NOTE — Progress Notes (Signed)
[facility] Progress Note    Assessment/ Plan:   Ms. 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   1. Acute kidney Injury- suspect 2/2 volume contraction and hypercalcemia. Urine Cr and Na wnl. 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 Per chart review, she has a history of proteinuria, unclear etiology, UA pending. Cr continues to improve, 1.4 today.  - Trend BMP, avoid nephrotoxic agents and monitor ins and outs.   2. Hypercalcemia- likely associated with volume contraction. PTH appropriately low, can exclude primary hyperparathyroidism. Urine calcium  nl, 25 hydroxy vitamin D pending. Started on isotonic fluids and calcitonin, will discontinue today. Calcium improving, 10.5; 11.2 when corrected for hypoalbunemia.  - Give zolendronic acid '3mg'$  x1 today  3. Electrolyte disturbances- hypokalemia resolved, mag pending. Replete as needed.  4. Hypernatremia- also likely in the setting of volume contraction/free water deficit. 152 today. Switch fluids to free water. D5 1/2 NS for 150cc/hr for 10 hours then to continue 100 cc/hr.  5. Leukocytosis- Resolved.  6. Multiple sclerosis- hold baclofen and tizanidine in the setting of AKI. 7. History of hypotension- holding midodrine.  8. Hypothyroidism- on levothyroxine 100 mcg. 9. Anemia of chronic disease- Hgb stable, baseline of ~8-9. Trend CBC. Continue iron supplement.  Subjective:   Patient reports feeling well this morning. Denies any headaches or changes in vision, n/v or abdominal pain.    Objective:   BP 125/72 (BP Location: Right Arm)   Pulse (!) 113   Temp 98.3 F (36.8 C) (Oral)   Resp 18    Ht '4\' 11"'$  (1.499 m)   Wt 46.8 kg   SpO2 97%   BMI 20.82 kg/m   Intake/Output Summary (Last 24 hours) at 10/16/2020 0910 Last data filed at 10/16/2020 0600 Gross per 24 hour  Intake 78.85 ml  Output 1950 ml  Net -1871.15 ml   Weight change:   Physical Exam: Gen: cachectic appearing, NAD  CVS: tachycardic, regular rhythm, no murmrus Resp: CTA bilaterally, normal effort  Abd: soft, epigastric tenderness, RLQ urostomy bag Ext: lower extremity contractures, multiple skin tears noted  Neuro: alert and oriented to self and place  Imaging: No results found.  Labs: BMET Recent Labs  Lab 10/13/20 2139 10/14/20 0015 10/14/20 0215 10/15/20 0449 10/16/20 0318  NA 148* 146* 147* 148* 152*  K 3.5 4.0 3.7 3.0* 3.7  CL 108 104 111 111 121*  CO2 31 33* '30 25 23  '$ GLUCOSE 101* 86 82 77 88  BUN 114* 105* 106* 59* 37*  CREATININE 3.08* 2.86* 2.88* 1.89* 1.43*  CALCIUM >15.0* >15.0* >15.0* 11.2* 10.5*  PHOS  --  2.3* 2.6  --   --    CBC Recent Labs  Lab 10/13/20 2139 10/14/20 1418 10/15/20 0449 10/16/20 0318  WBC 12.7* 10.2 8.5 10.4  NEUTROABS 10.7*  --   --   --   HGB 11.2* 9.4* 8.5* 8.9*  HCT 36.3 32.0* 27.7* 28.0*  MCV 95.3 99.7 95.2 93.3  PLT 408* 351 322 334    Medications:     ferrous sulfate  325 mg Oral Q breakfast   heparin  5,000 Units Subcutaneous Q8H   levothyroxine  100 mcg Oral  QAC breakfast   multivitamin with minerals  1 tablet Oral Daily   pantoprazole  40 mg Oral Daily   potassium chloride  40 mEq Oral BID   vitamin B-12  500 mcg Oral Daily      Cheyne Boulden, DO PGY-3 IMTS 10/16/2020, 9:10 AM

## 2020-10-17 ENCOUNTER — Inpatient Hospital Stay (HOSPITAL_COMMUNITY): Payer: Medicare (Managed Care)

## 2020-10-17 LAB — URINALYSIS, ROUTINE W REFLEX MICROSCOPIC
Bacteria, UA: NONE SEEN
Bilirubin Urine: NEGATIVE
Glucose, UA: NEGATIVE mg/dL
Ketones, ur: 5 mg/dL — AB
Nitrite: POSITIVE — AB
Protein, ur: NEGATIVE mg/dL
Specific Gravity, Urine: 1.008 (ref 1.005–1.030)
pH: 7 (ref 5.0–8.0)

## 2020-10-17 LAB — COMPREHENSIVE METABOLIC PANEL
ALT: 6 U/L (ref 0–44)
AST: 18 U/L (ref 15–41)
Albumin: 2.5 g/dL — ABNORMAL LOW (ref 3.5–5.0)
Alkaline Phosphatase: 71 U/L (ref 38–126)
Anion gap: 12 (ref 5–15)
BUN: 22 mg/dL — ABNORMAL HIGH (ref 6–20)
CO2: 20 mmol/L — ABNORMAL LOW (ref 22–32)
Calcium: 9.8 mg/dL (ref 8.9–10.3)
Chloride: 113 mmol/L — ABNORMAL HIGH (ref 98–111)
Creatinine, Ser: 1.05 mg/dL — ABNORMAL HIGH (ref 0.44–1.00)
GFR, Estimated: >60 mL/min (ref >60)
Glucose, Bld: 83 mg/dL (ref 70–99)
Potassium: 4.2 mmol/L (ref 3.5–5.1)
Sodium: 145 mmol/L (ref 135–145)
Total Bilirubin: 0.6 mg/dL (ref 0.3–1.2)
Total Protein: 5.4 g/dL — ABNORMAL LOW (ref 6.5–8.1)

## 2020-10-17 LAB — PHOSPHORUS: Phosphorus: 1.1 mg/dL — ABNORMAL LOW (ref 2.5–4.6)

## 2020-10-17 LAB — MAGNESIUM: Magnesium: 1.3 mg/dL — ABNORMAL LOW (ref 1.7–2.4)

## 2020-10-17 LAB — SARS CORONAVIRUS 2 (TAT 6-24 HRS): SARS Coronavirus 2: NEGATIVE

## 2020-10-17 MED ORDER — METOPROLOL TARTRATE 5 MG/5ML IV SOLN
5.0000 mg | Freq: Once | INTRAVENOUS | Status: AC | PRN
  Administered 2020-10-17: 5 mg via INTRAVENOUS
  Filled 2020-10-17: qty 5

## 2020-10-17 MED ORDER — MAGNESIUM OXIDE -MG SUPPLEMENT 400 (240 MG) MG PO TABS
400.0000 mg | ORAL_TABLET | Freq: Two times a day (BID) | ORAL | Status: AC
  Administered 2020-10-17 – 2020-10-19 (×6): 400 mg via ORAL
  Filled 2020-10-17 (×6): qty 1

## 2020-10-17 MED ORDER — MAGNESIUM SULFATE 4 GM/100ML IV SOLN
4.0000 g | Freq: Once | INTRAVENOUS | Status: AC
  Administered 2020-10-17: 4 g via INTRAVENOUS
  Filled 2020-10-17: qty 100

## 2020-10-17 MED ORDER — ENSURE ENLIVE PO LIQD
237.0000 mL | Freq: Three times a day (TID) | ORAL | Status: DC
  Administered 2020-10-18 – 2020-10-24 (×8): 237 mL via ORAL

## 2020-10-17 MED ORDER — K PHOS MONO-SOD PHOS DI & MONO 155-852-130 MG PO TABS
250.0000 mg | ORAL_TABLET | Freq: Three times a day (TID) | ORAL | Status: AC
  Administered 2020-10-17 – 2020-10-19 (×8): 250 mg via ORAL
  Filled 2020-10-17 (×9): qty 1

## 2020-10-17 MED ORDER — SODIUM CHLORIDE 0.9 % IV SOLN
1.0000 g | INTRAVENOUS | Status: DC
  Administered 2020-10-17: 1 g via INTRAVENOUS
  Filled 2020-10-17: qty 10

## 2020-10-17 MED ORDER — SODIUM PHOSPHATES 45 MMOLE/15ML IV SOLN
30.0000 mmol | Freq: Once | INTRAVENOUS | Status: AC
  Administered 2020-10-17: 30 mmol via INTRAVENOUS
  Filled 2020-10-17: qty 10

## 2020-10-17 NOTE — Progress Notes (Signed)
PROGRESS NOTE    Deanna Baldwin  [id number] DOB: Jul 29, 1975 DOA: 10/13/2020 PCP: [name], MD   Chief Complaint  Patient presents with   Medical Evaluation   Abnormal Lab    Brief Narrative: 47 old female with history of hypothyroidism, MS with contractures of her legs neuromuscular dysfunctions of bladder, status post urostomy, multiple pressure ulcers, with ambulatory dysfunction/sedentary, history of hypotension on midodrine, hypothyroidism from [place] sent to the ED for electrolyte abnormality Apparently patient was getting IV antibiotics for change in mental status at the facility. In the ED had leukocytosis severely elevated calcium level creatinine 3.0 EKG NSR, nephro was consulted and patient was admitted.  Managed with aggressive IV hydration calcitonin serial calcium monitoring.  PTH was appropriately suppressed, vitamin D, PTH RP and urine calcium ordered-  Subjective: Seen this morning reports has been doing a lot of water.  No new complaints.  Assessment & Plan: Acute kidney injury: peak 3.0,Likely hemodynamic mediated with poor oral intake/volume contraction and hypercalcemia.Appreciate nephrology input.  Creatinine improved 1.0.  Encourage oral hydration daily. Net IO Since Admission: -1,120.18 mL [10/17/20 1106]  Recent Labs  Lab 10/14/20 0015 10/14/20 0215 10/15/20 0449 10/16/20 0318 10/17/20 0354  BUN 105* 106* 59* 37* 22*  CREATININE 2.86* 2.88* 1.89* 1.43* 1.05*    Intake/Output Summary (Last 24 hours) at 10/17/2020 1106 Last data filed at 10/17/2020 0839 Gross per 24 hour  Intake 240 ml  Output 2750 ml  Net -2510 ml    Severe hypercalcemia: Corrected calcium 10.6. suspecting in the setting of volume contraction. Appreciate nephrology input managing with isotonic hydration and calcitonin x 4 doses calcium  >15 now at 11.6 ( corrected)> status post bisphosphonates 9/15.  Work-up-PTH is appropriately suppressed, urine calcium 25-hydroxy  vitamin D normal,PTH RP pending. Renal ultrasound reviewed.  Hypomagnesemia/hypophosphatemia likely from poor intake and also from bisphosphonates: Repleted aggressively w/ iv and po repeat labs to ensure they are normal prior to discharge  Hypernatremia: Improved with oral intake.   Recent Labs  Lab 10/14/20 0015 10/14/20 0215 10/15/20 0449 10/16/20 0318 10/17/20 0354  NA 146* 147* 148* 152* 145     Leukocytosis: Resolved.  No evidence of infection.  Suspect hemoconcentration .Does have multiple pressure ulcers-continue wound care. 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 Hypothyroidism-continue her Synthroid at current dose although TSH slightly up 5.8, 3 years ago was 37.  Would suggest rechecking TSH in 4 weeks to adjust Synthroid dose follow-up at the facility.  Moderate bilateral hydronephrosis/9 mm right lower pole calculus in the setting of neuromuscular dysfunction of bladder. Voiding.  Anemia likely from chronic disease continue iron supplement.  Monitor hemoglobin Recent Labs  Lab 10/13/20 2139 10/14/20 1418 10/15/20 0449 10/16/20 0318  HGB 11.2* 9.4* 8.5* 8.9*  HCT 36.3 32.0* 27.7* 28.0*     Low albumin/suspecting malnutrition/cachectic-augment diet.  Diet Order             Diet renal with fluid restriction Fluid restriction: Other (see comments); Room service appropriate? Yes; Fluid consistency: Thin  Diet effective now                  Patient's Body mass index is 20.82 kg/m.   DVT prophylaxis: heparin injection 5,000 Units Start: 10/14/20 1400 Place  TED hose Start: 10/14/20 1030 Code Status:   Code Status: Full Code  Family Communication: plan of care discussed  with patient at bedside. Status is: Inpatient Remains inpatient appropriate because:IV treatments appropriate due to intensity of illness or inability to take PO and Inpatient level of care appropriate due to severity of illness Dispo: The patient is from: [employer]              Anticipated d/c is to: SNF [facility], likely tomorrow              Patient currently is not medically stable to d/c.   Difficult to place patient No  Unresulted Labs (From admission, onward)     Start     Ordered   10/15/20 1124  PTH-related peptide  Once,   STAT        10/15/20 1123   10/13/20 1938  Urinalysis, Routine w reflex microscopic Urine, Unspecified Source  Once,   STAT        10/13/20 1937           Medications reviewed:  Scheduled Meds:  ferrous sulfate  325 mg Oral Q breakfast   heparin  5,000 Units Subcutaneous Q8H   levothyroxine  100 mcg Oral QAC breakfast   magnesium oxide  400 mg Oral BID   multivitamin with minerals  1 tablet Oral Daily   pantoprazole  40 mg Oral Daily   phosphorus  250 mg Oral TID   potassium chloride  40 mEq Oral BID   vitamin B-12  500 mcg Oral Daily   Continuous Infusions:  magnesium sulfate bolus IVPB 4 g (10/17/20 0908)   sodium phosphate  Dextrose 5% IVPB     Consultants:see note  Procedures:see note Antimicrobials: Anti-infectives (From admission, onward)    None      Culture/Microbiology    Component Value Date/Time   SDES BLOOD LEFT WRIST 08/02/2017 0003   SPECREQUEST  08/02/2017 0003    BOTTLES DRAWN AEROBIC AND ANAEROBIC Blood Culture results may not be optimal due to an excessive volume of blood received in culture bottles   CULT  08/02/2017 0003    NO GROWTH 5 DAYS Performed at [facility], [place] [address] [place], [place] [address]    REPTSTATUS 08/07/2017 FINAL 08/02/2017 0003    Other culture-see note  Objective: Vitals: Today's Vitals   10/16/20 1646 10/16/20 2004 10/17/20 0455 10/17/20 0949  BP: 111/78  118/81 120/84 119/82  Pulse: (!) 102 (!) 105 (!) 102 (!) 108  Resp: '18 16 18 16  '$ Temp: 99.1 F (37.3 C) 99.2 F (37.3 C) 98.7 F (37.1 C) 98.6 F (37 C)  TempSrc: Oral Oral Oral Oral  SpO2: 99% 97% 95% 97%  Weight:      Height:      PainSc:  0-No pain      Intake/Output Summary (Last 24 hours) at 10/17/2020 1106 Last data filed at 10/17/2020 0839 Gross per 24 hour  Intake 240 ml  Output 2750 ml  Net -2510 ml    Filed Weights   10/15/20 2047  Weight: 46.8 kg   Weight change:   Intake/Output from previous day: 09/15 0701 - 09/16 0700 In: 360 [P.O.:360] Out: 2300 [Urine:2300] Intake/Output this shift: Total I/O In: 0  Out: 450 [Urine:450] Filed Weights   10/15/20 2047  Weight: 46.8 kg   Examination: General exam: AAOx 3, thin, frail older than stated age, weak appearing. HEENT:Oral mucosa moist, Ear/Nose WNL grossly, dentition normal. Respiratory  system: bilaterally clear breath sounds, no use of accessory muscle Cardiovascular system: S1 & S2 +, No JVD,. Gastrointestinal system: Abdomen soft, urostomy in place with clear urine NT,ND, BS+ Nervous System:Alert, awake, moving extremities and grossly nonfocal Extremities: no edema, contractures in lower EXTR  Skin: No rashes,no icterus. MSK: Normal muscle bulk,tone, power   Data Reviewed: I have personally reviewed following labs and imaging studies CBC: Recent Labs  Lab 10/13/20 2139 10/14/20 1418 10/15/20 0449 10/16/20 0318  WBC 12.7* 10.2 8.5 10.4  NEUTROABS 10.7*  --   --   --   HGB 11.2* 9.4* 8.5* 8.9*  HCT 36.3 32.0* 27.7* 28.0*  MCV 95.3 99.7 95.2 93.3  PLT 408* 351 322 [id number]    Basic Metabolic Panel: Recent Labs  Lab 10/14/20 0015 10/14/20 0215 10/15/20 0449 10/16/20 0318 10/17/20 0354  NA 146* 147* 148* 152* 145  K 4.0 3.7 3.0* 3.7 4.2  CL 104 111 111 121* 113*  CO2 33* '30 25 23 '$ 20*  GLUCOSE 86 82 77 88 83  BUN 105* 106* 59* 37* 22*  CREATININE 2.86* 2.88* 1.89* 1.43* 1.05*  CALCIUM  >15.0* >15.0* 11.2* 10.5* 9.8  MG 1.8  --  1.0* 2.0 1.3*  PHOS 2.3* 2.6  --   --  1.1*    GFR: Estimated Creatinine Clearance: 46.6 mL/min (A) (by C-G formula based on SCr of 1.05 mg/dL (H)). Liver Function Tests: Recent Labs  Lab 10/13/20 2139 10/14/20 0015 10/14/20 0215 10/15/20 0449 10/16/20 0318 10/17/20 0354  AST 23 23  --  '23 22 18  '$ ALT 10 9  --  <5 <5 6  ALKPHOS 69 73  --  54 68 71  BILITOT 0.6 0.7  --  0.5 0.3 0.6  PROT 7.0 7.3  --  5.3* 5.7* 5.4*  ALBUMIN 3.3* 3.4* 3.0* 2.5* 2.6* 2.5*    No results for input(s): LIPASE, AMYLASE in the last 168 hours. No results for input(s): AMMONIA in the last 168 hours. Coagulation Profile: No results for input(s): INR, PROTIME in the last 168 hours. Cardiac Enzymes: No results for input(s): CKTOTAL, CKMB, CKMBINDEX, TROPONINI in the last 168 hours. BNP (last 3 results) No results for input(s): PROBNP in the last 8760 hours. HbA1C: No results for input(s): HGBA1C in the last 72 hours. CBG: No results for input(s): GLUCAP in the last 168 hours. Lipid Profile: No results for input(s): CHOL, HDL, LDLCALC, TRIG, CHOLHDL, LDLDIRECT in the last 72 hours. Thyroid Function Tests: Recent Labs    10/14/20 1418  TSH 5.832*    Anemia Panel: No results for input(s): VITAMINB12, FOLATE, FERRITIN, TIBC, IRON, RETICCTPCT in the last 72 hours. Sepsis Labs: No results for input(s): PROCALCITON, LATICACIDVEN in the last 168 hours.  Recent Results (from the past 240 hour(s))  Resp Panel by RT-PCR (Flu A&B, Covid) Nasopharyngeal Swab     Status: None   Collection Time: 10/13/20  8:38 PM   Specimen: Nasopharyngeal Swab; Nasopharyngeal(NP) swabs in vial transport medium  Result Value Ref Range Status   SARS Coronavirus 2 by RT PCR NEGATIVE NEGATIVE Final    Comment: (NOTE) SARS-CoV-2 target nucleic acids are NOT DETECTED.  The SARS-CoV-2 RNA is generally detectable in upper respiratory specimens during the acute phase of infection. The  lowest concentration of SARS-CoV-2 viral copies this assay can detect is 138 copies/mL. A negative result does not preclude SARS-Cov-2 infection and should not be used as the sole basis for treatment or other patient management decisions. A negative result may  occur with  improper specimen collection/handling, submission of specimen other than nasopharyngeal swab, presence of viral mutation(s) within the areas targeted by this assay, and inadequate number of viral copies(<138 copies/mL). A negative result must be combined with clinical observations, patient history, and epidemiological information. The expected result is Negative.  Fact Sheet for Patients:  EntrepreneurPulse.com.au  Fact Sheet for Healthcare Providers:  IncredibleEmployment.be  This test is no t yet approved or cleared by the [place] FDA and  has been authorized for detection and/or diagnosis of SARS-CoV-2 by FDA under an Emergency Use Authorization (EUA). This EUA will remain  in effect (meaning this test can be used) for the duration of the COVID-19 declaration under Section 564(b)(1) of the Act, 21 U.S.C.section 360bbb-3(b)(1), unless the authorization is terminated  or revoked sooner.       Influenza A by PCR NEGATIVE NEGATIVE Final   Influenza B by PCR NEGATIVE NEGATIVE Final    Comment: (NOTE) The Xpert Xpress SARS-CoV-2/FLU/RSV plus assay is intended as an aid in the diagnosis of influenza from Nasopharyngeal swab specimens and should not be used as a sole basis for treatment. Nasal washings and aspirates are unacceptable for Xpert Xpress SARS-CoV-2/FLU/RSV testing.  Fact Sheet for Patients: EntrepreneurPulse.com.au  Fact Sheet for Healthcare Providers: IncredibleEmployment.be  This test is not yet approved or cleared by the [place] FDA and has been authorized for detection and/or diagnosis of SARS-CoV-2 by FDA under  an Emergency Use Authorization (EUA). This EUA will remain in effect (meaning this test can be used) for the duration of the COVID-19 declaration under Section 564(b)(1) of the Act, 21 U.S.C. section 360bbb-3(b)(1), unless the authorization is terminated or revoked.  Performed at [facility], [place] [address] [place], [place] [address]       Radiology Studies: No results found.   LOS: 3 days   Antonieta Pert, MD [facility]  10/17/2020, 11:06 AM

## 2020-10-17 NOTE — TOC Initial Note (Signed)
Transition of Care [facility]) - Initial/Assessment Note    Patient Details  Name: Deanna Baldwin MRN: [id number] Date of Birth: 1975-09-26  Transition of Care [facility]) CM/SW Contact:    Sable Feil, LCSW Phone Number: 10/17/2020, 11:46 AM    Clinical Narrative:  Talked with patient at bedside regarding her discharge plan. Deanna Baldwin was in bed and presented as awake, alert and agreeable to talking with CSW. Deanna Baldwin reported that she has been a [name] for 5 years and plans to return there at discharge. She requested that her sister Deanna Baldwin - [phone]) be contacted when she is ready for discharge.                  Expected Discharge Plan: [place] [facility]) Barriers to Discharge: Continued Medical Work up   Patient Goals and CMS Choice Patient states their goals for this hospitalization and ongoing recovery are:: Patient plans to continue care at [facility] upon discharge CMS Medicare.gov Compare Post Acute Care list provided to:: Other (Comment Required) (Not needed as patient LTC at [facility]) Choice offered to / list presented to : NA  Expected Discharge Plan and Services Expected Discharge Plan: [facility]) In-house Referral: Clinical Social Work     Living arrangements for the past 2 months: [place] ([employer])                                      Prior Living Arrangements/Services Living arrangements for the past 2 months: [place] ([employer]) Lives with:: Facility Resident ([place])   Do you feel safe going back to the place where you live?: Yes      Need for Family Participation in Patient Care: No (Comment) Care giver support system in place?: Yes (comment)   Criminal Activity/Legal Involvement Pertinent to Current Situation/Hospitalization: No - Comment as needed  Activities of Daily Living Home Assistive Devices/Equipment: Other  (Comment) (SNF resident) ADL Screening (condition at time of admission) Patient's cognitive ability adequate to safely complete daily activities?: Yes Is the patient deaf or have difficulty hearing?: No Does the patient have difficulty seeing, even when wearing glasses/contacts?: No Does the patient have difficulty concentrating, remembering, or making decisions?: No Patient able to express need for assistance with ADLs?: Yes Does the patient have difficulty dressing or bathing?: Yes Independently performs ADLs?: No Communication: Independent Dressing (OT): Needs assistance Is this a change from baseline?: Pre-admission baseline Grooming: Needs assistance Is this a change from baseline?: Pre-admission baseline Feeding: Needs assistance Is this a change from baseline?: Pre-admission baseline Bathing: Needs assistance Is this a change from baseline?: Pre-admission baseline Toileting: Dependent Is this a change from baseline?: Pre-admission baseline In/Out Bed: Dependent Is this a change from baseline?: Pre-admission baseline Walks in Home: Dependent Is this a change from baseline?: Pre-admission baseline Does the patient have difficulty walking or climbing stairs?: Yes Weakness of Legs: Both Weakness of Arms/Hands: Both  Permission Sought/Granted Permission sought to share information with : Family Supports Permission granted to share information with : Yes, Verbal Permission Granted  Share Information with NAME: [place] granted to share info w Relationship: Sister  Permission granted to share info w Contact Information: [phone]  Emotional Assessment Appearance:: Appears younger than stated age Attitude/Demeanor/Rapport: Engaged Affect (typically observed): Appropriate Orientation: : Oriented to Self, Oriented to Place, Oriented to  Time,  Oriented to Situation Alcohol / Substance Use: Tobacco Use, Alcohol Use, Illicit Drugs (Per H&P patient does not smoke,  drink or use illicit drugs) Psych Involvement: No (comment)  Admission diagnosis:  Hypercalcemia [E83.52] AKI (acute kidney injury) ([place]) [N17.9] Patient Active Problem List   Diagnosis Date Noted   Hypercalcemia 10/14/2020   Multiple sclerosis ([place]) 05/06/2020   Palliative care encounter 03/08/2018   Atelectasis    HCAP (healthcare-associated pneumonia) 08/02/2017   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[place]) 05/23/2017   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   Hypothyroidism 04/09/2017   Hypocalcemia 04/08/2017   Anemia 04/08/2017   Osteomyelitis ([place]) 03/19/2017   Altered mental status    Dehydration    Acute osteomyelitis of left pelvic region and thigh ([place])    SIRS (systemic inflammatory response syndrome) ([place]) 03/17/2017   Paraplegia ([place]) 03/09/2017   Chronic constipation 02/13/2017   Elevated liver enzymes 02/13/2017   Chronic pain 12/13/2016   Leukocytosis 09/15/2016   Hypotension 08/31/2016   Vitamin B 12 deficiency 07/09/2016   Pressure ulcer, stage 4 ([place]) 07/08/2016   Sepsis ([place]) 07/07/2016   Unintentional weight loss 07/06/2016   Relapsing remitting multiple sclerosis ([place]) 06/08/2016   Neurogenic bladder 04/09/2016   Tachycardia 04/08/2016   Anemia of chronic disease 04/06/2016   Dysphagia, oral phase 03/17/2016   Palliative care by specialist    Protein-calorie malnutrition, severe ([place])    Spastic 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 ([place]) 03/02/2016   UTI (urinary tract infection) 03/02/2016   Elevated liver function tests 03/02/2016   PCP:  [name], MD Pharmacy:  No Pharmacies Listed    Social Determinants of Health (SDOH) Interventions  No SDOH interventions requested or needed at discharge.  Readmission Risk Interventions No flowsheet data found.

## 2020-10-17 NOTE — Progress Notes (Addendum)
[facility] Progress Note    Assessment/ Plan:   Deanna Baldwin is a 7yoF residing at [facility] with a past medical history of spastic 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 who presented with abnormal labs with a reported calcium level of 22, sodium of 156 and elevated BUN and Creatine.   1. Acute kidney Injury- suspect 2/2 volume contraction and hypercalcemia. Urine Cr and Na wnl. 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 Per chart review, she has a history of proteinuria, unclear etiology, UA pending. Cr continues to improve, 1.05.  - Trend BMP, avoid nephrotoxic agents and monitor ins and outs  2. Hypercalcemia- likely associated with volume contraction. PTH appropriately low, can exclude primary hyperparathyroidism. Urine calcium  and 25 hydroxy vitamin D nl. Started on isotonic fluids, calcitonin for 48 hours and given one dose of pamidronate with improvement of calcium to 9.8, corrected 10.6. Will hold further IVF as she is tolerating PO intake.   3. Electrolyte disturbances- mag and phos low, repleting.   4. Hypernatremia- resolved, 145 this morning. Encourage PO intake.  5. Leukocytosis- Resolved.  6. Multiple sclerosis- hold baclofen and tizanidine in the setting of AKI. 7. History of hypotension- holding midodrine.  8. Hypothyroidism- on levothyroxine 100 mcg. 9. Anemia of chronic disease- Hgb stable, baseline of ~8-9. Trend CBC. Continue iron supplement.  Subjective:   Patient reports feeling well this morning. Denies any headaches or changes in vision, n/v or abdominal pain.    Objective:   BP 120/84 (BP Location: Right Arm)   Pulse (!) 102   Temp 98.7 F (37.1 C) (Oral)   Resp 18   Ht '4\' 11"'$  (1.499 m)   Wt 46.8 kg   SpO2 95%   BMI 20.82 kg/m   Intake/Output Summary (Last 24 hours) at 10/17/2020 [id number] Last data  filed at 10/17/2020 [id number] Gross per 24 hour  Intake 240 ml  Output 2750 ml  Net -2510 ml   Weight change:   Physical Exam: Gen: cachectic appearing, NAD  CVS: tachycardic, regular rhythm, no murmrus Resp: CTA bilaterally, normal effort  Abd: soft, epigastric tenderness, RLQ urostomy bag Ext: lower extremity contractures, multiple skin tears noted  Neuro: alert and oriented to self and place  Imaging: No results found.  Labs: BMET Recent Labs  Lab 10/13/20 2139 10/14/20 0015 10/14/20 0215 10/15/20 0449 10/16/20 0318 10/17/20 0354  NA 148* 146* 147* 148* 152* 145  K 3.5 4.0 3.7 3.0* 3.7 4.2  CL 108 104 111 111 121* 113*  CO2 31 33* '30 25 23 '$ 20*  GLUCOSE 101* 86 82 77 88 83  BUN 114* 105* 106* 59* 37* 22*  CREATININE 3.08* 2.86* 2.88* 1.89* 1.43* 1.05*  CALCIUM >15.0* >15.0* >15.0* 11.2* 10.5* 9.8  PHOS  --  2.3* 2.6  --   --  1.1*   CBC Recent Labs  Lab 10/13/20 2139 10/14/20 1418 10/15/20 0449 10/16/20 0318  WBC 12.7* 10.2 8.5 10.4  NEUTROABS 10.7*  --   --   --   HGB 11.2* 9.4* 8.5* 8.9*  HCT 36.3 32.0* 27.7* 28.0*  MCV 95.3 99.7 95.2 93.3  PLT 408* 351 322 334    Medications:     ferrous sulfate  325 mg Oral Q breakfast   heparin  5,000 Units Subcutaneous Q8H   levothyroxine  100 mcg Oral QAC breakfast   magnesium oxide  400 mg Oral BID   multivitamin with minerals  1 tablet Oral Daily   pantoprazole  40 mg Oral Daily   phosphorus  250 mg Oral TID   potassium chloride  40 mEq Oral BID   vitamin B-12  500 mcg Oral Daily      Kamryn Messineo, DO PGY-3 IMTS 10/17/2020, 9:28 AM

## 2020-10-17 NOTE — [place] FL2 (Signed)
[place] MEDICAID FL2 LEVEL OF CARE SCREENING TOOL     IDENTIFICATION  Patient Name: Deanna Baldwin Birthdate: 02-Jun-1975 Sex: female Admission Date (Current Location): 10/13/2020  [place] and [place] Number:  Kathleen Argue [id number] [place] and Address:  [facility] [place], [place] [address] [place], [place] [address]      Provider Number: [id number]  Attending Physician Name and Address:  Antonieta Pert, MD  Relative Name and Phone Number:  Crystal-sister: [phone]; Venora Maples - aunt, [phone]    Current Level of Care: Hospital Recommended Level of Care: Nursing Facility (Patient LTC at [facility]) Prior Approval Number:    Date Approved/Denied:   PASRR Number:    Discharge Plan: SNF    Current Diagnoses: Patient Active Problem List   Diagnosis Date Noted   Hypercalcemia 10/14/2020   Multiple sclerosis ([place]) 05/06/2020   Palliative care encounter 03/08/2018   Atelectasis    HCAP (healthcare-associated pneumonia) 08/02/2017   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[place]) 05/23/2017   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   Hypothyroidism 04/09/2017   Hypocalcemia 04/08/2017   Anemia 04/08/2017   Osteomyelitis ([place]) 03/19/2017   Altered mental status    Dehydration    Acute osteomyelitis of left pelvic region and thigh ([place])    SIRS (systemic inflammatory response syndrome) ([place]) 03/17/2017   Paraplegia ([place]) 03/09/2017   Chronic constipation 02/13/2017   Elevated liver enzymes 02/13/2017   Chronic pain 12/13/2016   Leukocytosis 09/15/2016   Hypotension 08/31/2016   Vitamin B 12 deficiency 07/09/2016   Pressure ulcer, stage 4 ([place]) 07/08/2016   Sepsis ([place]) 07/07/2016   Unintentional weight loss 07/06/2016   Relapsing remitting multiple sclerosis ([place]) 06/08/2016   Neurogenic bladder 04/09/2016   Tachycardia 04/08/2016   Anemia of chronic disease 04/06/2016   Dysphagia, oral phase 03/17/2016   Palliative care by specialist    Protein-calorie  malnutrition, severe ([place])    Spastic 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 ([place]) 03/02/2016   UTI (urinary tract infection) 03/02/2016   Elevated liver function tests 03/02/2016    Orientation RESPIRATION BLADDER Height & Weight     Self, Time, Situation, Place  Normal Urostomy Weight: 103 lb 1.6 oz (46.8 kg) Height:  '4\' 11"'$  (149.9 cm)  BEHAVIORAL SYMPTOMS/MOOD NEUROLOGICAL BOWEL NUTRITION STATUS      Continent Diet  AMBULATORY STATUS COMMUNICATION OF NEEDS Skin   Total Care (Patient is bedbound) Verbally Other (Comment) (Multiple pressure wounds)                       Personal Care Assistance Level of Assistance  Bathing, Feeding, Dressing Bathing Assistance: Maximum assistance Feeding assistance: Limited assistance Dressing Assistance: Maximum assistance     Functional Limitations Info  Sight, Hearing, Speech Sight Info: Adequate Hearing Info: Adequate Speech Info: Adequate    SPECIAL CARE FACTORS FREQUENCY                       Contractures Contractures Info: Present (LUE, RLE, LLE)    Additional Factors Info  Code Status, Allergies Code Status Info: Full Allergies Info: Natalizunmab           Current Medications (10/17/2020):  This is the current hospital active medication list Current Facility-Administered Medications  Medication Dose Route Frequency Provider Last Rate Last Admin   acetaminophen (TYLENOL) tablet 650 mg  650 mg Oral Q6H PRN Sueanne Margarita, DO       Or   acetaminophen (TYLENOL) suppository 650 mg  650 mg Rectal  Q6H PRN Sueanne Margarita, DO       ferrous sulfate tablet 325 mg  325 mg Oral Q breakfast Sueanne Margarita, DO   325 mg at 10/16/20 1208   heparin injection 5,000 Units  5,000 Units Subcutaneous Q8H Sueanne Margarita, DO   5,000 Units at 10/17/20 0500   levothyroxine (SYNTHROID) tablet 100 mcg  100 mcg Oral QAC breakfast Sueanne Margarita, DO   100 mcg at 10/17/20 0500   magnesium oxide (MAG-OX) tablet 400 mg  400 mg Oral BID Antonieta Pert, MD   400 mg at 10/17/20 0855   multivitamin with minerals tablet 1 tablet  1 tablet Oral Daily Sueanne Margarita, DO   1 tablet at 10/17/20 0856   pantoprazole (PROTONIX) EC tablet 40 mg  40 mg Oral Daily Sueanne Margarita, DO   40 mg at 10/17/20 0856   phosphorus (K PHOS NEUTRAL) tablet 250 mg  250 mg Oral TID Kc, Maren Beach, MD       potassium chloride SA (KLOR-CON) CR tablet 40 mEq  40 mEq Oral BID Rehman, Areeg N, DO   40 mEq at 10/17/20 0856   sodium phosphate 30 mmol in dextrose 5 % 250 mL infusion  30 mmol Intravenous Once Kc, Maren Beach, MD 43 mL/hr at 10/17/20 1158 30 mmol at 10/17/20 1158   vitamin B-12 (CYANOCOBALAMIN) tablet 500 mcg  500 mcg Oral Daily Sueanne Margarita, DO   500 mcg at 10/17/20 [id number]     Discharge Medications: Please see discharge summary for a list of discharge medications.  Relevant Imaging Results:  Relevant Lab Results:   Additional Information ss [phone]  Sharlet Salina Mila Homer, LCSW

## 2020-10-17 NOTE — Care Management Important Message (Signed)
Important Message  Patient Details  Name: Deanna Baldwin MRN: [id number] Date of Birth: 04-23-1975   Medicare Important Message Given:  Yes     Gabryelle Whitmoyer Montine Circle 10/17/2020, 3:09 PM

## 2020-10-17 NOTE — Progress Notes (Signed)
Initial Nutrition Assessment  DOCUMENTATION CODES:   Not applicable  INTERVENTION:  -Recommend liberalize diet to regular to increase PO intake -Ensure Enlive po TID, each supplement provides 350 kcal and 20 grams of protein -MVI with minerals daily  NUTRITION DIAGNOSIS:   Inadequate oral intake related to poor appetite as evidenced by meal completion < 50%.  GOAL:   Patient will meet greater than or equal to 90% of their needs  MONITOR:   PO intake, Supplement acceptance, Labs, Weight trends, I & O's, Skin  REASON FOR ASSESSMENT:   Malnutrition Screening Tool    ASSESSMENT:   Pt with PMH significant for hypothyroidism, MS w/ contractures of legs, neuromuscular dysfunction of bladder s/p urostomy, multiple pressure ulcers, and ambulatory dysfunction/functional quadriplegia admitted with AKI and electrolyte abnormalities.  Pt unavailable at time of RD visit x2 attempts.    PO Intake: 0-55% x 5 recorded meals (31% average intake)  Medications: ferrous sulfate, mag-ox, mvi with minerals, protonix, K Phos neutral, Klor-con, vitamin B12, IV sodium phosphate x1  Labs: BUN 22(H), Cr 1.05 (H), PO4 1.1 (L), Mg 1.3 (L)  UOP: 2.3L x24 hours I/O: -863m since admit  NUTRITION - FOCUSED PHYSICAL EXAM: Unable to complete at this time. Will attempt at follow-up.   Diet Order:   Diet Order             Diet renal with fluid restriction Fluid restriction: Other (see comments); Room service appropriate? Yes; Fluid consistency: Thin  Diet effective now                   EDUCATION NEEDS:   No education needs have been identified at this time  Skin:  Skin Assessment: Reviewed RN Assessment  Last BM:  9/14  Height:   Ht Readings from Last 1 Encounters:  10/15/20 '4\' 11"'$  (1.499 m)    Weight:   Wt Readings from Last 10 Encounters:  10/15/20 46.8 kg  09/28/18 43.1 kg  11/17/17 40.6 kg  10/17/17 39.5 kg  08/18/17 41.3 kg  08/15/17 41.3 kg  08/05/17 44.5 kg   07/07/17 40.9 kg  05/09/17 41.8 kg  04/14/17 41.8 kg    BMI:  Body mass index is 20.82 kg/m.  Estimated Nutritional Needs:   Kcal:  1400-1600  Protein:  70-85 grams  Fluid:  >1.4L/d    ALarkin Ina MS, RD, LDN (she/her/hers) RD pager number and weekend/on-call pager number located in [place]

## 2020-10-18 ENCOUNTER — Inpatient Hospital Stay (HOSPITAL_COMMUNITY): Payer: Medicare (Managed Care)

## 2020-10-18 DIAGNOSIS — R509 Fever, unspecified: Secondary | ICD-10-CM

## 2020-10-18 DIAGNOSIS — L8990 Pressure ulcer of unspecified site, unspecified stage: Secondary | ICD-10-CM | POA: Insufficient documentation

## 2020-10-18 LAB — CBC WITH DIFFERENTIAL/PLATELET
Abs Immature Granulocytes: 0.07 10*3/uL (ref 0.00–0.07)
Basophils Absolute: 0 10*3/uL (ref 0.0–0.1)
Basophils Relative: 0 %
Eosinophils Absolute: 0 10*3/uL (ref 0.0–0.5)
Eosinophils Relative: 0 %
HCT: 33.9 % — ABNORMAL LOW (ref 36.0–46.0)
Hemoglobin: 10.9 g/dL — ABNORMAL LOW (ref 12.0–15.0)
Immature Granulocytes: 1 %
Lymphocytes Relative: 7 %
Lymphs Abs: 0.7 10*3/uL (ref 0.7–4.0)
MCH: 29.2 pg (ref 26.0–34.0)
MCHC: 32.2 g/dL (ref 30.0–36.0)
MCV: 90.9 fL (ref 80.0–100.0)
Monocytes Absolute: 0.1 10*3/uL (ref 0.1–1.0)
Monocytes Relative: 1 %
Neutro Abs: 8.6 10*3/uL — ABNORMAL HIGH (ref 1.7–7.7)
Neutrophils Relative %: 91 %
Platelets: 292 10*3/uL (ref 150–400)
RBC: 3.73 MIL/uL — ABNORMAL LOW (ref 3.87–5.11)
RDW: 14.2 % (ref 11.5–15.5)
WBC: 9.5 10*3/uL (ref 4.0–10.5)
nRBC: 0 % (ref 0.0–0.2)

## 2020-10-18 LAB — RESP PANEL BY RT-PCR (FLU A&B, COVID) ARPGX2
Influenza A by PCR: NEGATIVE
Influenza B by PCR: NEGATIVE
SARS Coronavirus 2 by RT PCR: NEGATIVE

## 2020-10-18 LAB — BASIC METABOLIC PANEL
Anion gap: 11 (ref 5–15)
BUN: 17 mg/dL (ref 6–20)
CO2: 24 mmol/L (ref 22–32)
Calcium: 8.3 mg/dL — ABNORMAL LOW (ref 8.9–10.3)
Chloride: 111 mmol/L (ref 98–111)
Creatinine, Ser: 1.21 mg/dL — ABNORMAL HIGH (ref 0.44–1.00)
GFR, Estimated: 57 mL/min — ABNORMAL LOW (ref >60)
Glucose, Bld: 98 mg/dL (ref 70–99)
Potassium: 4.1 mmol/L (ref 3.5–5.1)
Sodium: 146 mmol/L — ABNORMAL HIGH (ref 135–145)

## 2020-10-18 LAB — MAGNESIUM: Magnesium: 2.1 mg/dL (ref 1.7–2.4)

## 2020-10-18 LAB — LACTIC ACID, PLASMA: Lactic Acid, Venous: 0.6 mmol/L (ref 0.5–1.9)

## 2020-10-18 LAB — PHOSPHORUS: Phosphorus: 4 mg/dL (ref 2.5–4.6)

## 2020-10-18 MED ORDER — PIPERACILLIN-TAZOBACTAM 3.375 G IVPB
3.3750 g | Freq: Three times a day (TID) | INTRAVENOUS | Status: DC
  Administered 2020-10-18 – 2020-10-23 (×15): 3.375 g via INTRAVENOUS
  Filled 2020-10-18 (×16): qty 50

## 2020-10-18 MED ORDER — IBUPROFEN 600 MG PO TABS
600.0000 mg | ORAL_TABLET | Freq: Once | ORAL | Status: AC
  Administered 2020-10-18: 600 mg via ORAL
  Filled 2020-10-18: qty 1

## 2020-10-18 MED ORDER — METOPROLOL TARTRATE 5 MG/5ML IV SOLN
5.0000 mg | Freq: Once | INTRAVENOUS | Status: AC
  Administered 2020-10-18: 5 mg via INTRAVENOUS
  Filled 2020-10-18: qty 5

## 2020-10-18 MED ORDER — IOHEXOL 9 MG/ML PO SOLN
ORAL | Status: AC
  Administered 2020-10-18: 500 mL
  Filled 2020-10-18: qty 1000

## 2020-10-18 MED ORDER — MORPHINE SULFATE (PF) 2 MG/ML IV SOLN
1.0000 mg | Freq: Once | INTRAVENOUS | Status: AC
Start: 2020-10-18 — End: 2020-10-18
  Administered 2020-10-18: 1 mg via INTRAVENOUS
  Filled 2020-10-18: qty 1

## 2020-10-18 MED ORDER — SODIUM CHLORIDE 0.45 % IV BOLUS
1000.0000 mL | Freq: Once | INTRAVENOUS | Status: AC
  Administered 2020-10-18: 1000 mL via INTRAVENOUS

## 2020-10-18 MED ORDER — VANCOMYCIN HCL IN DEXTROSE 1-5 GM/200ML-% IV SOLN
1000.0000 mg | INTRAVENOUS | Status: DC
  Administered 2020-10-18: 1000 mg via INTRAVENOUS
  Filled 2020-10-18: qty 200

## 2020-10-18 MED ORDER — PIPERACILLIN-TAZOBACTAM 3.375 G IVPB 30 MIN
3.3750 g | INTRAVENOUS | Status: AC
  Administered 2020-10-18: 3.375 g via INTRAVENOUS
  Filled 2020-10-18: qty 50

## 2020-10-18 MED ORDER — IOHEXOL 300 MG/ML  SOLN
80.0000 mL | Freq: Once | INTRAMUSCULAR | Status: AC | PRN
  Administered 2020-10-18: 80 mL via INTRAVENOUS

## 2020-10-18 MED ORDER — MIDODRINE HCL 5 MG PO TABS
2.5000 mg | ORAL_TABLET | Freq: Three times a day (TID) | ORAL | Status: DC
  Administered 2020-10-18 – 2020-10-20 (×5): 2.5 mg via ORAL
  Filled 2020-10-18 (×6): qty 1

## 2020-10-18 NOTE — Progress Notes (Signed)
Transfer note  Overnight patient has been ill.  She has had persistent fevers T-max 103.1.  Patient has been tachycardic pulse rate from 106 to 146 and she is becoming tachypneic.  Patient's blood pressure has remained stable.  Patient received Tylenol twice and ibuprofen once, temperature decreased but quickly rebounded.  Patient has been alert and oriented throughout the night.  Blood cultures x2 collected.  Chest x-ray and UA collected and negative.  No leukocytosis or bandemia on a.m. labs.  Sacral decubitus examined, no clear infective site but patient with semiformed stool that has gotten into decub.  Due to persistent fever CT chest abdomen pelvis ordered with contrast.  Patient is Mew score increased to 8.  Patient transferred to stepdown.  Patient on Zosyn and vancomycin.

## 2020-10-18 NOTE — Progress Notes (Signed)
   10/17/20 2059  Assess: MEWS Score  Temp (!) 103 F (39.4 C)  BP 120/73  Pulse Rate (!) 146  Resp (!) 23  Level of Consciousness Alert  SpO2 96 %  O2 Device Room Air  Patient Activity (if Appropriate) In bed  Assess: MEWS Score  MEWS Temp 2  MEWS Systolic 0  MEWS Pulse 3  MEWS RR 1  MEWS LOC 0  MEWS Score 6  MEWS Score Color Red   Patient's MEW score was  6 secondary to T-103, HR-146, RR-23.  Dr. Claria Dice made aware.  Orders received for IV Metoprolol 5 mg IV, IV Rocephin, UA, Chest XRAY, PO Tylenol, BC x 2 and implemented.  As night progressed, MEWS continued to climb as high as 8.  Rapid Response made aware of situation.  Dr. Claria Dice came to bedside to assess patient.  Dressing changes done to Rt trochanter Pressure wound.  Moderate drainage to area.  Order received for IV Metoprolol, IV Bolus 1/2 NS, IV Vanc, IV Zosyn, IV Morphine 1 mg x 1 received and implemented.  Order received to transfer patient to [id number].  At time of transfer, patient's MEW was 3.  Temperature down to 98.1, HR- 101 to 105, RR 22.  Earleen Reaper RN

## 2020-10-18 NOTE — Progress Notes (Signed)
Pharmacy Antibiotic Note  Deanna Baldwin is a 45 y.o. female admitted on 10/13/2020 with sepsis.  Pharmacy has been consulted for vancomycin dosing.  Febrile (Tmax 103.1), WBC and platelets wnl.  Plan: Give vancomycin 1000 mg IV every 48 hours (calculated AUC 428, Scr used 1.21). Monitor CBC, renal function, blood culture results, vancomycin levels as needed.  Height: '4\' 11"'$  (149.9 cm) Weight: 42.7 kg (94 lb 2.2 oz) IBW/kg (Calculated) : 43.2  Temp (24hrs), Avg:100.9 F (38.3 C), Min:98.6 F (37 C), Max:103.1 F (39.5 C)  Recent Labs  Lab 10/13/20 2139 10/14/20 0015 10/14/20 0215 10/14/20 1418 10/15/20 0449 10/16/20 0318 10/17/20 0354 10/18/20 0254  WBC 12.7*  --   --  10.2 8.5 10.4  --  9.5  CREATININE 3.08*   < > 2.88*  --  1.89* 1.43* 1.05* 1.21*   < > = values in this interval not displayed.    Estimated Creatinine Clearance: 40 mL/min (A) (by C-G formula based on SCr of 1.21 mg/dL (H)).    Allergies  Allergen Reactions   Natalizumab     Antimicrobials this admission: Ceftriaxone 9/16 >> Vancomycin 9/17 >>  Microbiology results: 9/17 BCx: in process   Thank you for allowing pharmacy to be a part of this patient's care.  Collene Gobble, Student Pharmacist 10/18/2020 4:24 AM

## 2020-10-18 NOTE — Progress Notes (Signed)
PROGRESS NOTE    Deanna Baldwin  [id number] DOB: 06/26/1975 DOA: 10/13/2020 PCP: [name], MD   Chief Complaint  Patient presents with   Medical Evaluation   Abnormal Lab    Brief Narrative: 42 old female with history of hypothyroidism, MS with contractures of her legs neuromuscular dysfunctions of bladder, status post urostomy, multiple pressure ulcers, with ambulatory dysfunction/sedentary, history of hypotension on midodrine, hypothyroidism from [place] sent to the ED for electrolyte abnormality Apparently patient was getting IV antibiotics for change in mental status at the facility. In the ED had leukocytosis severely elevated calcium level creatinine 3.0 EKG NSR, nephro was consulted and patient was admitted.  Managed with aggressive IV hydration calcitonin serial calcium monitoring.  PTH was appropriately suppressed, vitamin D urine calcium nl. Managed with calcitonin x48 hours, aggressive fluid and bisphosphonates x1 with resolution of hypercalcemia  Subjective:  Overnight patient has had persistent fever tachycardia soft blood pressure CT chest abdomen pelvis ordered bolus ordered COVID-19 rapid screening pending and transferred to stepdown to [place]  on 4 E. patient reports he feels some better.  Denies cough chest pain.  Ileostomy having output Blood pressure 97/ 72 now, temperature 98, lactic acid 0.6  Assessment & Plan:  Fever overnight  9/16 with tachycardia/h red MEWS: Transferred to stepdown from 47M,. labs with no leukocytosis normal lactic acid.  UA done shows WBC 11-20 nitrate positive leukocyte small-??  UTI, blood culture has been ordered CT chest abdomen pelvis ordered also ordered rapid COVID.  Patient has been placed on empiric Vanco and Zosyn overnight.  Does have pressure ulcer rule out  OSTE.  Acute kidney injury: peak 3.0,Likely hemodynamic mediated with poor oral intake/volume contraction and hypercalcemia.overall stabilized- slightly up at  1.2 this am.  On IVF.  Encouraged p.o.   Severe hypercalcemia: suspecting in the setting of volume contraction. Appreciate nephrology input s/p  isotonic hydration,calcitonin x 4, bisphosphonates x1 9/15 and normalized. Work-up-PTH is appropriately suppressed, urine calcium 25-hydroxy vitamin D normal,PTH RP pending.  Hypomagnesemia/hypophosphatemia likely from poor intake and also from bisphosphonates: Replaced aggressively,normalized this morning  Hypernatremia: Improved with oral intake.  Encourage p.o. Recent Labs  Lab 10/14/20 0215 10/15/20 0449 10/16/20 0318 10/17/20 0354 10/18/20 0254  NA 147* 148* 152* 145 146*     Leukocytosis: Resolved.  No evidence of infection on admission.  Suspect hemoconcentration .Does have multiple pressure ulcers-continue wound care. 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  Would suggest rechecking TSH in 4 weeks to adjust Synthroid dose follow-up at the facility.  Moderate bilateral hydronephrosis/9 mm right lower pole calculus in the setting of neuromuscular dysfunction of bladder. Renal US done, monitor OP.  Anemia likely from chronic disease continue iron supplement.  Monitor hemoglobin Recent Labs  Lab 10/13/20 2139 10/14/20 1418 10/15/20 0449 10/16/20 0318 10/18/20 0254  HGB 11.2* 9.4* 8.5* 8.9* 10.9*  HCT 36.3 32.0* 27.7* 28.0* 33.9*     Low albumin/suspecting malnutrition/cachectic-augment diet.  Diet Order             Diet renal with fluid restriction Fluid restriction: Other (see comments); Room service  appropriate? Yes; Fluid consistency: Thin  Diet effective now                  Patient's Body mass index is 19.01 kg/m.   DVT prophylaxis: heparin injection 5,000 Units Start: 10/14/20 1400 Place TED hose Start: 10/14/20 1030 Code Status:   Code Status: Full Code  Family Communication: plan of care discussed with patient at bedside. Status is: Inpatient Remains inpatient appropriate because:IV treatments appropriate due to intensity of illness or inability to take PO and Inpatient level of care appropriate due to severity of illness Dispo: The patient is from: [facility]              Anticipated d/c is to: SNF [facility].              Patient currently is not medically stable to d/c.   Difficult to place patient No  Unresulted Labs (From admission, onward)     Start     Ordered   10/19/20 0500  Comprehensive metabolic panel  Daily,   R      10/18/20 0849   10/19/20 0500  CBC  Daily,   R      10/18/20 0849   10/18/20 0853  Resp Panel by RT-PCR (Flu A&B, Covid) Nasopharyngeal Swab  (Tier 2 - Symptomatic/asymptomatic with Precautions )  ONCE - STAT,   STAT       Question Answer Comment  Is this test for diagnosis or screening Diagnosis of ill patient   Symptomatic for COVID-19 as defined by CDC Yes   Date of Symptom Onset 10/18/2020   Hospitalized for COVID-19 Yes   Admitted to ICU for COVID-19 No   Previously tested for COVID-19 Yes   Resident in a congregate (group) care setting Yes   Employed in healthcare setting No   Pregnant No   Has patient completed COVID vaccination(s) (2 doses of Pfizer/Moderna 1 dose of [employer]) Yes   Has patient completed COVID Booster / 3rd dose Yes      10/18/20 0852   10/17/20 2147  Culture, blood (routine x 2)  BLOOD CULTURE X 2,   R (with TIMED occurrences)      10/17/20 2147   10/15/20 1124  PTH-related peptide  Once,   STAT        10/15/20 1123   10/13/20 1938  Urinalysis, Routine w reflex microscopic Urine,  Unspecified Source  Once,   STAT        10/13/20 1937           Medications reviewed:  Scheduled Meds:  feeding supplement  237 mL Oral TID BM   ferrous sulfate  325 mg Oral Q breakfast   heparin  5,000 Units Subcutaneous Q8H   levothyroxine  100 mcg Oral QAC breakfast   magnesium oxide  400 mg Oral BID   multivitamin with minerals  1 tablet Oral Daily   pantoprazole  40 mg Oral Daily   phosphorus  250 mg Oral TID   potassium chloride  40 mEq Oral BID   vitamin B-12  500 mcg Oral Daily   Continuous Infusions:  piperacillin-tazobactam (ZOSYN)  IV     vancomycin 1,000 mg (10/18/20 0754)   Consultants:see note  Procedures:see note Antimicrobials: Anti-infectives (From admission, onward)    Start     Dose/Rate Route Frequency Ordered Stop   10/18/20 1300  piperacillin-tazobactam (ZOSYN) IVPB 3.375 g        3.375 g 12.5 mL/hr over 240 Minutes Intravenous Every 8 hours 10/18/20  0533     10/18/20 0600  piperacillin-tazobactam (ZOSYN) IVPB 3.375 g        3.375 g 100 mL/hr over 30 Minutes Intravenous STAT 10/18/20 0533 10/18/20 0832   10/18/20 0500  vancomycin (VANCOCIN) IVPB 1000 mg/200 mL premix        1,000 mg 200 mL/hr over 60 Minutes Intravenous Every 48 hours 10/18/20 0445     10/17/20 2200  cefTRIAXone (ROCEPHIN) 1 g in sodium chloride 0.9 % 100 mL IVPB  Status:  Discontinued        1 g 200 mL/hr over 30 Minutes Intravenous Every 24 hours 10/17/20 2149 10/18/20 0528      Culture/Microbiology    Component Value Date/Time   SDES BLOOD LEFT WRIST 08/02/2017 0003   SPECREQUEST  08/02/2017 0003    BOTTLES DRAWN AEROBIC AND ANAEROBIC Blood Culture results may not be optimal due to an excessive volume of blood received in culture bottles   CULT  08/02/2017 0003    NO GROWTH 5 DAYS Performed at [facility] [address], [place], [place] [address]    REPTSTATUS 08/07/2017 FINAL 08/02/2017 0003    Other culture-see note  Objective: Vitals: Today's Vitals    10/18/20 0533 10/18/20 0650 10/18/20 0831 10/18/20 0848  BP: 106/71 (!) 89/68 (!) 86/72 97/72  Pulse: (!) 137 (!) 105 (!) 101 99  Resp: (!) 38 (!) '23 20 19  '$ Temp: (!) 102.3 F (39.1 C) 98.1 F (36.7 C) 99.3 F (37.4 C) 98.6 F (37 C)  TempSrc: Oral Oral Oral Oral  SpO2: 96%  96% 100%  Weight:      Height:      PainSc: 0-No pain 0-No pain      Intake/Output Summary (Last 24 hours) at 10/18/2020 0949 Last data filed at 10/18/2020 0750 Gross per 24 hour  Intake 820.11 ml  Output 1225 ml  Net -404.89 ml    Filed Weights   10/15/20 2047 10/17/20 2059  Weight: 46.8 kg 42.7 kg   Weight change:   Intake/Output from previous day: 09/16 0701 - 09/17 0700 In: 700.1 [P.O.:600; IV Piggyback:100.1] Out: 1675 [Urine:1675] Intake/Output this shift: Total I/O In: 120 [P.O.:120] Out: -  Filed Weights   10/15/20 2047 10/17/20 2059  Weight: 46.8 kg 42.7 kg   Examination: General exam: AAOx 3, pleasant, not in distress, OLder than stated age, weak appearing. HEENT:Oral mucosa moist, Ear/Nose WNL grossly, dentition normal. Respiratory system: bilaterally clear, no use of accessory muscle Cardiovascular system: S1 & S2 +, No JVD,. Gastrointestinal system: Abdomen soft, ileostomy in place with clear urine,NT,ND, BS+ Nervous System:Alert, awake, moving extremities and grossly nonfocal Extremities: no edema, distal peripheral pulses palpable.  Contractures in lower extremities Skin: No rashes,no icterus. MSK: thin muscle bulk,tone, power   Data Reviewed: I have personally reviewed following labs and imaging studies CBC: Recent Labs  Lab 10/13/20 2139 10/14/20 1418 10/15/20 0449 10/16/20 0318 10/18/20 0254  WBC 12.7* 10.2 8.5 10.4 9.5  NEUTROABS 10.7*  --   --   --  8.6*  HGB 11.2* 9.4* 8.5* 8.9* 10.9*  HCT 36.3 32.0* 27.7* 28.0* 33.9*  MCV 95.3 99.7 95.2 93.3 90.9  PLT 408* 351 322 [id number]    Basic Metabolic Panel: Recent Labs  Lab 10/14/20 0015 10/14/20 0215  10/15/20 0449 10/16/20 0318 10/17/20 0354 10/18/20 0254  NA 146* 147* 148* 152* 145 146*  K 4.0 3.7 3.0* 3.7 4.2 4.1  CL 104 111 111 121* 113* 111  CO2 33*  $'30 25 23 'q$ 20* 24  GLUCOSE 86 82 77 88 83 98  BUN 105* 106* 59* 37* 22* 17  CREATININE 2.86* 2.88* 1.89* 1.43* 1.05* 1.21*  CALCIUM >15.0* >15.0* 11.2* 10.5* 9.8 8.3*  MG 1.8  --  1.0* 2.0 1.3* 2.1  PHOS 2.3* 2.6  --   --  1.1* 4.0    GFR: Estimated Creatinine Clearance: 40 mL/min (A) (by C-G formula based on SCr of 1.21 mg/dL (H)). Liver Function Tests: Recent Labs  Lab 10/13/20 2139 10/14/20 0015 10/14/20 0215 10/15/20 0449 10/16/20 0318 10/17/20 0354  AST 23 23  --  '23 22 18  '$ ALT 10 9  --  <5 <5 6  ALKPHOS 69 73  --  54 68 71  BILITOT 0.6 0.7  --  0.5 0.3 0.6  PROT 7.0 7.3  --  5.3* 5.7* 5.4*  ALBUMIN 3.3* 3.4* 3.0* 2.5* 2.6* 2.5*    No results for input(s): LIPASE, AMYLASE in the last 168 hours. No results for input(s): AMMONIA in the last 168 hours. Coagulation Profile: No results for input(s): INR, PROTIME in the last 168 hours. Cardiac Enzymes: No results for input(s): CKTOTAL, CKMB, CKMBINDEX, TROPONINI in the last 168 hours. BNP (last 3 results) No results for input(s): PROBNP in the last 8760 hours. HbA1C: No results for input(s): HGBA1C in the last 72 hours. CBG: No results for input(s): GLUCAP in the last 168 hours. Lipid Profile: No results for input(s): CHOL, HDL, LDLCALC, TRIG, CHOLHDL, LDLDIRECT in the last 72 hours. Thyroid Function Tests: No results for input(s): TSH, T4TOTAL, FREET4, T3FREE, THYROIDAB in the last 72 hours.  Anemia Panel: No results for input(s): VITAMINB12, FOLATE, FERRITIN, TIBC, IRON, RETICCTPCT in the last 72 hours. Sepsis Labs: Recent Labs  Lab 10/18/20 [id number]  LATICACIDVEN 0.6    Recent Results (from the past 240 hour(s))  Resp Panel by RT-PCR (Flu A&B, Covid) Nasopharyngeal Swab     Status: None   Collection Time: 10/13/20  8:38 PM   Specimen: Nasopharyngeal  Swab; Nasopharyngeal(NP) swabs in vial transport medium  Result Value Ref Range Status   SARS Coronavirus 2 by RT PCR NEGATIVE NEGATIVE Final    Comment: (NOTE) SARS-CoV-2 target nucleic acids are NOT DETECTED.  The SARS-CoV-2 RNA is generally detectable in upper respiratory specimens during the acute phase of infection. The lowest concentration of SARS-CoV-2 viral copies this assay can detect is 138 copies/mL. A negative result does not preclude SARS-Cov-2 infection and should not be used as the sole basis for treatment or other patient management decisions. A negative result may occur with  improper specimen collection/handling, submission of specimen other than nasopharyngeal swab, presence of viral mutation(s) within the areas targeted by this assay, and inadequate number of viral copies(<138 copies/mL). A negative result must be combined with clinical observations, patient history, and epidemiological information. The expected result is Negative.  Fact Sheet for Patients:  EntrepreneurPulse.com.au  Fact Sheet for Healthcare Providers:  IncredibleEmployment.be  This test is no t yet approved or cleared by the [place] FDA and  has been authorized for detection and/or diagnosis of SARS-CoV-2 by FDA under an Emergency Use Authorization (EUA). This EUA will remain  in effect (meaning this test can be used) for the duration of the COVID-19 declaration under Section 564(b)(1) of the Act, 21 U.S.C.section 360bbb-3(b)(1), unless the authorization is terminated  or revoked sooner.       Influenza A by PCR NEGATIVE NEGATIVE Final   Influenza B by PCR NEGATIVE NEGATIVE Final  Comment: (NOTE) The Xpert Xpress SARS-CoV-2/FLU/RSV plus assay is intended as an aid in the diagnosis of influenza from Nasopharyngeal swab specimens and should not be used as a sole basis for treatment. Nasal washings and aspirates are unacceptable for Xpert Xpress  SARS-CoV-2/FLU/RSV testing.  Fact Sheet for Patients: EntrepreneurPulse.com.au  Fact Sheet for Healthcare Providers: IncredibleEmployment.be  This test is not yet approved or cleared by the [place] FDA and has been authorized for detection and/or diagnosis of SARS-CoV-2 by FDA under an Emergency Use Authorization (EUA). This EUA will remain in effect (meaning this test can be used) for the duration of the COVID-19 declaration under Section 564(b)(1) of the Act, 21 U.S.C. section 360bbb-3(b)(1), unless the authorization is terminated or revoked.  Performed at [facility], [place] [address], [place], [place] [address]   SARS CORONAVIRUS 2 (TAT 6-24 HRS) Nasopharyngeal Nasopharyngeal Swab     Status: None   Collection Time: 10/17/20  5:17 PM   Specimen: Nasopharyngeal Swab  Result Value Ref Range Status   SARS Coronavirus 2 NEGATIVE NEGATIVE Final    Comment: (NOTE) SARS-CoV-2 target nucleic acids are NOT DETECTED.  The SARS-CoV-2 RNA is generally detectable in upper and lower respiratory specimens during the acute phase of infection. Negative results do not preclude SARS-CoV-2 infection, do not rule out co-infections with other pathogens, and should not be used as the sole basis for treatment or other patient management decisions. Negative results must be combined with clinical observations, patient history, and epidemiological information. The expected result is Negative.  Fact Sheet for Patients: SugarRoll.be  Fact Sheet for Healthcare Providers: https://www.woods-mathews.com/  This test is not yet approved or cleared by the [place] FDA and  has been authorized for detection and/or diagnosis of SARS-CoV-2 by FDA under an Emergency Use Authorization (EUA). This EUA will remain  in effect (meaning this test can be used) for the duration of the COVID-19 declaration under Se ction  564(b)(1) of the Act, 21 U.S.C. section 360bbb-3(b)(1), unless the authorization is terminated or revoked sooner.  Performed at [facility], [place] [address], [place], [place] [address]       Radiology Studies: DG CHEST PORT 1 VIEW  Result Date: 10/17/2020 CLINICAL DATA:  Fever. EXAM: PORTABLE CHEST 1 VIEW COMPARISON:  Chest x-ray 08/10/2017 CT chest 729 FINDINGS: The heart and mediastinal contours are within normal limits. No focal consolidation. No pulmonary edema. Query trace left pleural effusion. No pneumothorax. No acute osseous abnormality. IMPRESSION: Query trace left pleural effusion. Electronically Signed   By: Iven Finn M.D.   On: 10/17/2020 22:19     LOS: 4 days   Antonieta Pert, MD [facility]  10/18/2020, 9:49 AM

## 2020-10-18 NOTE — Progress Notes (Signed)
Arrived from 5MW.  CHG bath completed.  CCMD notified.  Vital signs per protocol.  A&Ox3.  Oriented to room.  Call bell in hand.

## 2020-10-18 NOTE — Progress Notes (Signed)
Pharmacy Antibiotic Note  Deanna Baldwin is a 45 y.o. female admitted on 10/13/2020 with sepsis.  Pharmacy has been consulted for vancomycin dosing.  Febrile (Tmax 103.1), WBC and platelets wnl.  Plan: Continu vancomycin 1000 mg IV every 48 hours (calculated AUC 428, Scr used 1.21). Zosyn 3.375gm IV q8h Monitor CBC, renal function, blood culture results, vancomycin levels as needed.  Height: '4\' 11"'$  (149.9 cm) Weight: 42.7 kg (94 lb 2.2 oz) IBW/kg (Calculated) : 43.2  Temp (24hrs), Avg:101.2 F (38.4 C), Min:98.6 F (37 C), Max:103.1 F (39.5 C)  Recent Labs  Lab 10/13/20 2139 10/14/20 0015 10/14/20 0215 10/14/20 1418 10/15/20 0449 10/16/20 0318 10/17/20 0354 10/18/20 0254  WBC 12.7*  --   --  10.2 8.5 10.4  --  9.5  CREATININE 3.08*   < > 2.88*  --  1.89* 1.43* 1.05* 1.21*   < > = values in this interval not displayed.     Estimated Creatinine Clearance: 40 mL/min (A) (by C-G formula based on SCr of 1.21 mg/dL (H)).    Allergies  Allergen Reactions   Natalizumab     Antimicrobials this admission: Ceftriaxone 9/16 x1 Vancomycin 9/17 >> Zosyn 9/17 >>  Microbiology results: 9/17 BCx: in process   Thank you for allowing pharmacy to be a part of this patient's care.  Franky Macho, Student Pharmacist 10/18/2020 5:31 AM

## 2020-10-19 DIAGNOSIS — N179 Acute kidney failure, unspecified: Secondary | ICD-10-CM

## 2020-10-19 DIAGNOSIS — R509 Fever, unspecified: Secondary | ICD-10-CM | POA: Diagnosis not present

## 2020-10-19 LAB — COMPREHENSIVE METABOLIC PANEL
ALT: 50 U/L — ABNORMAL HIGH (ref 0–44)
AST: 110 U/L — ABNORMAL HIGH (ref 15–41)
Albumin: 2.2 g/dL — ABNORMAL LOW (ref 3.5–5.0)
Alkaline Phosphatase: 109 U/L (ref 38–126)
Anion gap: 11 (ref 5–15)
BUN: 21 mg/dL — ABNORMAL HIGH (ref 6–20)
CO2: 22 mmol/L (ref 22–32)
Calcium: 6.7 mg/dL — ABNORMAL LOW (ref 8.9–10.3)
Chloride: 106 mmol/L (ref 98–111)
Creatinine, Ser: 1.2 mg/dL — ABNORMAL HIGH (ref 0.44–1.00)
GFR, Estimated: 57 mL/min — ABNORMAL LOW (ref >60)
Glucose, Bld: 83 mg/dL (ref 70–99)
Potassium: 4.8 mmol/L (ref 3.5–5.1)
Sodium: 139 mmol/L (ref 135–145)
Total Bilirubin: 0.5 mg/dL (ref 0.3–1.2)
Total Protein: 5.4 g/dL — ABNORMAL LOW (ref 6.5–8.1)

## 2020-10-19 LAB — CBC
HCT: 24.1 % — ABNORMAL LOW (ref 36.0–46.0)
HCT: 27.3 % — ABNORMAL LOW (ref 36.0–46.0)
Hemoglobin: 7.7 g/dL — ABNORMAL LOW (ref 12.0–15.0)
Hemoglobin: 8.8 g/dL — ABNORMAL LOW (ref 12.0–15.0)
MCH: 29.3 pg (ref 26.0–34.0)
MCH: 29.8 pg (ref 26.0–34.0)
MCHC: 32 g/dL (ref 30.0–36.0)
MCHC: 32.2 g/dL (ref 30.0–36.0)
MCV: 91.6 fL (ref 80.0–100.0)
MCV: 92.5 fL (ref 80.0–100.0)
Platelets: 248 10*3/uL (ref 150–400)
Platelets: 266 10*3/uL (ref 150–400)
RBC: 2.63 MIL/uL — ABNORMAL LOW (ref 3.87–5.11)
RBC: 2.95 MIL/uL — ABNORMAL LOW (ref 3.87–5.11)
RDW: 14.3 % (ref 11.5–15.5)
RDW: 14.6 % (ref 11.5–15.5)
WBC: 8.4 10*3/uL (ref 4.0–10.5)
WBC: 8.4 10*3/uL (ref 4.0–10.5)
nRBC: 0 % (ref 0.0–0.2)
nRBC: 0 % (ref 0.0–0.2)

## 2020-10-19 MED ORDER — SODIUM CHLORIDE 0.9 % IV SOLN: INTRAVENOUS | Status: DC

## 2020-10-19 MED ORDER — SODIUM CHLORIDE 0.9 % IV BOLUS
250.0000 mL | Freq: Once | INTRAVENOUS | Status: AC
  Administered 2020-10-19: 250 mL via INTRAVENOUS

## 2020-10-19 NOTE — Progress Notes (Signed)
Deanna Baldwin  [id number] DOB: Aug 14, 1975 DOA: 10/13/2020 PCP: [name], MD    Brief Narrative:  45 old SNF resident with history of hypothyroidism, MS with contractures of her legs neuromuscular dysfunctions of bladder, status post urostomy, multiple pressure ulcers, with ambulatory dysfunction/sedentary, history of hypotension on midodrine, hypothyroidism from [place] sent to the ED for electrolyte abnormality Apparently patient was getting IV antibiotics for change in mental status at the facility. In the ED had leukocytosis severely elevated calcium level creatinine 3.0 EKG NSR,   Significant Events:  Nephrology was consulted and patient was admitted.  Managed with aggressive IV hydration calcitonin serial calcium monitoring.  PTH was appropriately suppressed, vitamin D urine calcium nl. Managed with calcitonin x48 hours, aggressive fluid and bisphosphonates x1 with resolution of hypercalcemia  Consultants:  Nephrology  Code Status: FULL CODE  Antimicrobials:  Ceftriaxone 9/16 Zosyn 9/17 > Vancomycin 9/17 >  DVT prophylaxis: Subcu heparin  Subjective: No recurrence of fever.  Sinus tachycardia at approximately 115.  Blood pressure and saturations stable.  Resting comfortably in bed.  Denies chest pain shortness of breath fevers or chills.  Is alert and oriented.  Assessment & Plan:  FUO Transferred to progressive care 9/16 due to fever and tachycardia -no clear source of infection -tachycardia persists but patient not febrile -discontinue empiric antibiotics after 3 days of treatment as we have no idea what we are treating -gently hydrate  Acute kidney injury Creatinine peaked at 3.0 -likely prerenal/ATN due to very limited intake  Recent Labs  Lab 10/15/20 0449 10/16/20 0318 10/17/20 0354 10/18/20 0254 10/19/20 0106  CREATININE 1.89* 1.43* 1.05* 1.21* 1.20*     Severe hypercalcemia Due to severe dehydration -status post isotonic hydration,  calcitonin x4, bisphosphonate x1 -calcium has normalized -PTH was appropriately suppressed  Hypomagnesemia -hypophosphatemia Due to very poor intake/nutritional status -corrected with supplementation  Hypernatremia Due to dehydration -resolved  Multiple sclerosis -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  Chronic hypotension Continue usual midodrine dose  Hypothyroidism Continue usual Synthroid dose  Moderate bilateral hydronephrosis -9 mm right lower pole calculus  Anemia of chronic disease Hemoglobin has been quite variable while here with possible 3 g drop over the last 24 hours -doubt this is truly reflective of bleeding but we will recheck CBC later today  Recent Labs  Lab 10/13/20 2139 10/14/20 1418 10/15/20 0449 10/16/20 0318 10/18/20 0254 10/19/20 0614  HGB 11.2* 9.4* 8.5* 8.9* 10.9* 7.7*    Family Communication:  Status is: Inpatient  Remains inpatient appropriate because:Inpatient level of care appropriate due to severity of illness  Dispo: The patient is from: SNF              Anticipated d/c is to: SNF              Patient currently is not medically stable to d/c.   Difficult to place patient No   Objective: Blood pressure 94/67, pulse (!) 105, temperature 99 F (37.2 C), temperature source Oral, resp. rate 20, height '4\' 11"'$  (1.499 m), weight 42.7 kg, SpO2 100 %.  Intake/Output Summary (Last 24 hours) at 10/19/2020 0824 Last data filed at 10/19/2020 0700 Gross per 24 hour  Intake --  Output 2350 ml  Net -2350 ml   Filed Weights   10/15/20 2047 10/17/20 2059  Weight: 46.8 kg 42.7 kg    Examination: General: No acute respiratory distress Lungs: Clear to auscultation bilaterally without wheezes or crackles Cardiovascular: Tachycardic but regular without murmur or rub Abdomen: Nontender,  nondistended, soft, bowel sounds positive, no rebound, no ascites, no appreciable mass Extremities: No significant cyanosis,  clubbing, or edema bilateral lower extremities  CBC: Recent Labs  Lab 10/13/20 2139 10/14/20 1418 10/16/20 0318 10/18/20 0254 10/19/20 0614  WBC 12.7*   < > 10.4 9.5 8.4  NEUTROABS 10.7*  --   --  8.6*  --   HGB 11.2*   < > 8.9* 10.9* 7.7*  HCT 36.3   < > 28.0* 33.9* 24.1*  MCV 95.3   < > 93.3 90.9 91.6  PLT 408*   < > 334 292 248   < > = values in this interval not displayed.   Basic Metabolic Panel: Recent Labs  Lab 10/14/20 0215 10/15/20 0449 10/16/20 0318 10/17/20 0354 10/18/20 0254 10/19/20 0106  NA 147*   < > 152* 145 146* 139  K 3.7   < > 3.7 4.2 4.1 4.8  CL 111   < > 121* 113* 111 106  CO2 30   < > 23 20* 24 22  GLUCOSE 82   < > 88 83 98 83  BUN 106*   < > 37* 22* 17 21*  CREATININE 2.88*   < > 1.43* 1.05* 1.21* 1.20*  CALCIUM >15.0*   < > 10.5* 9.8 8.3* 6.7*  MG  --    < > 2.0 1.3* 2.1  --   PHOS 2.6  --   --  1.1* 4.0  --    < > = values in this interval not displayed.   GFR: Estimated Creatinine Clearance: 40.3 mL/min (A) (by C-G formula based on SCr of 1.2 mg/dL (H)).  Liver Function Tests: Recent Labs  Lab 10/15/20 0449 10/16/20 0318 10/17/20 0354 10/19/20 0106  AST '23 22 18 '$ 110*  ALT <5 <5 6 50*  ALKPHOS 54 68 71 109  BILITOT 0.5 0.3 0.6 0.5  PROT 5.3* 5.7* 5.4* 5.4*  ALBUMIN 2.5* 2.6* 2.5* 2.2*    HbA1C: Hemoglobin A1C  Date/Time Value Ref Range Status  06/24/2016 12:00 AM 5.5  Final    Recent Results (from the past 240 hour(s))  Resp Panel by RT-PCR (Flu A&B, Covid) Nasopharyngeal Swab     Status: None   Collection Time: 10/13/20  8:38 PM   Specimen: Nasopharyngeal Swab; Nasopharyngeal(NP) swabs in vial transport medium  Result Value Ref Range Status   SARS Coronavirus 2 by RT PCR NEGATIVE NEGATIVE Final    Comment: (NOTE) SARS-CoV-2 target nucleic acids are NOT DETECTED.  The SARS-CoV-2 RNA is generally detectable in upper respiratory specimens during the acute phase of infection. The lowest concentration of SARS-CoV-2 viral  copies this assay can detect is 138 copies/mL. A negative result does not preclude SARS-Cov-2 infection and should not be used as the sole basis for treatment or other patient management decisions. A negative result may occur with  improper specimen collection/handling, submission of specimen other than nasopharyngeal swab, presence of viral mutation(s) within the areas targeted by this assay, and inadequate number of viral copies(<138 copies/mL). A negative result must be combined with clinical observations, patient history, and epidemiological information. The expected result is Negative.  Fact Sheet for Patients:  EntrepreneurPulse.com.au  Fact Sheet for Healthcare Providers:  IncredibleEmployment.be  This test is no t yet approved or cleared by the [place] FDA and  has been authorized for detection and/or diagnosis of SARS-CoV-2 by FDA under an Emergency Use Authorization (EUA). This EUA will remain  in effect (meaning this test can be used) for the  duration of the COVID-19 declaration under Section 564(b)(1) of the Act, 21 U.S.C.section 360bbb-3(b)(1), unless the authorization is terminated  or revoked sooner.       Influenza A by PCR NEGATIVE NEGATIVE Final   Influenza B by PCR NEGATIVE NEGATIVE Final    Comment: (NOTE) The Xpert Xpress SARS-CoV-2/FLU/RSV plus assay is intended as an aid in the diagnosis of influenza from Nasopharyngeal swab specimens and should not be used as a sole basis for treatment. Nasal washings and aspirates are unacceptable for Xpert Xpress SARS-CoV-2/FLU/RSV testing.  Fact Sheet for Patients: EntrepreneurPulse.com.au  Fact Sheet for Healthcare Providers: IncredibleEmployment.be  This test is not yet approved or cleared by the [place] FDA and has been authorized for detection and/or diagnosis of SARS-CoV-2 by FDA under an Emergency Use Authorization (EUA). This  EUA will remain in effect (meaning this test can be used) for the duration of the COVID-19 declaration under Section 564(b)(1) of the Act, 21 U.S.C. section 360bbb-3(b)(1), unless the authorization is terminated or revoked.  Performed at [facility], [place] [address], [place], [place] [address]   SARS CORONAVIRUS 2 (TAT 6-24 HRS) Nasopharyngeal Nasopharyngeal Swab     Status: None   Collection Time: 10/17/20  5:17 PM   Specimen: Nasopharyngeal Swab  Result Value Ref Range Status   SARS Coronavirus 2 NEGATIVE NEGATIVE Final    Comment: (NOTE) SARS-CoV-2 target nucleic acids are NOT DETECTED.  The SARS-CoV-2 RNA is generally detectable in upper and lower respiratory specimens during the acute phase of infection. Negative results do not preclude SARS-CoV-2 infection, do not rule out co-infections with other pathogens, and should not be used as the sole basis for treatment or other patient management decisions. Negative results must be combined with clinical observations, patient history, and epidemiological information. The expected result is Negative.  Fact Sheet for Patients: SugarRoll.be  Fact Sheet for Healthcare Providers: https://www.woods-mathews.com/  This test is not yet approved or cleared by the [place] FDA and  has been authorized for detection and/or diagnosis of SARS-CoV-2 by FDA under an Emergency Use Authorization (EUA). This EUA will remain  in effect (meaning this test can be used) for the duration of the COVID-19 declaration under Se ction 564(b)(1) of the Act, 21 U.S.C. section 360bbb-3(b)(1), unless the authorization is terminated or revoked sooner.  Performed at [facility], [place] [address] [place], [place] [address]   Culture, blood (routine x 2)     Status: None (Preliminary result)   Collection Time: 10/18/20  2:54 AM   Specimen: BLOOD  Result Value Ref Range Status   Specimen Description  BLOOD RIGHT ANTECUBITAL  Final   Special Requests   Final    BOTTLES DRAWN AEROBIC AND ANAEROBIC Blood Culture adequate volume   Culture   Final    NO GROWTH 1 DAY Performed at [facility], [place] [address] [place], [place] [address]    Report Status PENDING  Incomplete  Culture, blood (routine x 2)     Status: None (Preliminary result)   Collection Time: 10/18/20  2:54 AM   Specimen: BLOOD RIGHT HAND  Result Value Ref Range Status   Specimen Description BLOOD RIGHT HAND  Final   Special Requests   Final    BOTTLES DRAWN AEROBIC AND ANAEROBIC Blood Culture adequate volume   Culture   Final    NO GROWTH 1 DAY Performed at [facility], [place] [address] [place], [place] [address]    Report Status  PENDING  Incomplete  Resp Panel by RT-PCR (Flu A&B, Covid) Nasopharyngeal Swab     Status: None   Collection Time: 10/18/20  9:20 AM   Specimen: Nasopharyngeal Swab; Nasopharyngeal(NP) swabs in vial transport medium  Result Value Ref Range Status   SARS Coronavirus 2 by RT PCR NEGATIVE NEGATIVE Final    Comment: (NOTE) SARS-CoV-2 target nucleic acids are NOT DETECTED.  The SARS-CoV-2 RNA is generally detectable in upper respiratory specimens during the acute phase of infection. The lowest concentration of SARS-CoV-2 viral copies this assay can detect is 138 copies/mL. A negative result does not preclude SARS-Cov-2 infection and should not be used as the sole basis for treatment or other patient management decisions. A negative result may occur with  improper specimen collection/handling, submission of specimen other than nasopharyngeal swab, presence of viral mutation(s) within the areas targeted by this assay, and inadequate number of viral copies(<138 copies/mL). A negative result must be combined with clinical observations, patient history, and epidemiological information. The expected result is Negative.  Fact Sheet for Patients:   EntrepreneurPulse.com.au  Fact Sheet for Healthcare Providers:  IncredibleEmployment.be  This test is no t yet approved or cleared by the [place] FDA and  has been authorized for detection and/or diagnosis of SARS-CoV-2 by FDA under an Emergency Use Authorization (EUA). This EUA will remain  in effect (meaning this test can be used) for the duration of the COVID-19 declaration under Section 564(b)(1) of the Act, 21 U.S.C.section 360bbb-3(b)(1), unless the authorization is terminated  or revoked sooner.       Influenza A by PCR NEGATIVE NEGATIVE Final   Influenza B by PCR NEGATIVE NEGATIVE Final    Comment: (NOTE) The Xpert Xpress SARS-CoV-2/FLU/RSV plus assay is intended as an aid in the diagnosis of influenza from Nasopharyngeal swab specimens and should not be used as a sole basis for treatment. Nasal washings and aspirates are unacceptable for Xpert Xpress SARS-CoV-2/FLU/RSV testing.  Fact Sheet for Patients: EntrepreneurPulse.com.au  Fact Sheet for Healthcare Providers: IncredibleEmployment.be  This test is not yet approved or cleared by the [place] FDA and has been authorized for detection and/or diagnosis of SARS-CoV-2 by FDA under an Emergency Use Authorization (EUA). This EUA will remain in effect (meaning this test can be used) for the duration of the COVID-19 declaration under Section 564(b)(1) of the Act, 21 U.S.C. section 360bbb-3(b)(1), unless the authorization is terminated or revoked.  Performed at [facility], [place] [address], [place], [place] [address]      Scheduled Meds:  feeding supplement  237 mL Oral TID BM   ferrous sulfate  325 mg Oral Q breakfast   heparin  5,000 Units Subcutaneous Q8H   levothyroxine  100 mcg Oral QAC breakfast   magnesium oxide  400 mg Oral BID   midodrine  2.5 mg Oral TID WC   multivitamin with minerals  1 tablet Oral Daily    pantoprazole  40 mg Oral Daily   phosphorus  250 mg Oral TID   potassium chloride  40 mEq Oral BID   vitamin B-12  500 mcg Oral Daily   Continuous Infusions:  piperacillin-tazobactam (ZOSYN)  IV 3.375 g (10/19/20 0530)   vancomycin 1,000 mg (10/18/20 0754)     LOS: 5 days   Cherene Altes, MD [facility] Office  [phone] Pager - Text Page per Shea Evans  If 7PM-7AM, please contact night-coverage per Amion 10/19/2020, 8:24 AM

## 2020-10-20 LAB — COMPREHENSIVE METABOLIC PANEL
ALT: 66 U/L — ABNORMAL HIGH (ref 0–44)
ALT: 69 U/L — ABNORMAL HIGH (ref 0–44)
AST: 128 U/L — ABNORMAL HIGH (ref 15–41)
AST: 124 U/L — ABNORMAL HIGH (ref 15–41)
Albumin: 2.1 g/dL — ABNORMAL LOW (ref 3.5–5.0)
Albumin: 2.3 g/dL — ABNORMAL LOW (ref 3.5–5.0)
Alkaline Phosphatase: 153 U/L — ABNORMAL HIGH (ref 38–126)
Alkaline Phosphatase: 181 U/L — ABNORMAL HIGH (ref 38–126)
Anion gap: 13 (ref 5–15)
Anion gap: 15 (ref 5–15)
BUN: 16 mg/dL (ref 6–20)
BUN: 11 mg/dL (ref 6–20)
CO2: 19 mmol/L — ABNORMAL LOW (ref 22–32)
CO2: 18 mmol/L — ABNORMAL LOW (ref 22–32)
Calcium: 5.7 mg/dL — CL (ref 8.9–10.3)
Calcium: 5.5 mg/dL — CL (ref 8.9–10.3)
Chloride: 109 mmol/L (ref 98–111)
Chloride: 109 mmol/L (ref 98–111)
Creatinine, Ser: 1.23 mg/dL — ABNORMAL HIGH (ref 0.44–1.00)
Creatinine, Ser: 1.2 mg/dL — ABNORMAL HIGH (ref 0.44–1.00)
GFR, Estimated: 56 mL/min — ABNORMAL LOW (ref >60)
GFR, Estimated: 57 mL/min — ABNORMAL LOW (ref >60)
Glucose, Bld: 100 mg/dL — ABNORMAL HIGH (ref 70–99)
Glucose, Bld: 118 mg/dL — ABNORMAL HIGH (ref 70–99)
Potassium: 3.4 mmol/L — ABNORMAL LOW (ref 3.5–5.1)
Potassium: 3.8 mmol/L (ref 3.5–5.1)
Sodium: 141 mmol/L (ref 135–145)
Sodium: 142 mmol/L (ref 135–145)
Total Bilirubin: 0.3 mg/dL (ref 0.3–1.2)
Total Bilirubin: 0.6 mg/dL (ref 0.3–1.2)
Total Protein: 5.5 g/dL — ABNORMAL LOW (ref 6.5–8.1)
Total Protein: 5.9 g/dL — ABNORMAL LOW (ref 6.5–8.1)

## 2020-10-20 LAB — CBC
HCT: 23.4 % — ABNORMAL LOW (ref 36.0–46.0)
Hemoglobin: 7.6 g/dL — ABNORMAL LOW (ref 12.0–15.0)
MCH: 29.8 pg (ref 26.0–34.0)
MCHC: 32.5 g/dL (ref 30.0–36.0)
MCV: 91.8 fL (ref 80.0–100.0)
Platelets: 267 10*3/uL (ref 150–400)
RBC: 2.55 MIL/uL — ABNORMAL LOW (ref 3.87–5.11)
RDW: 14.6 % (ref 11.5–15.5)
WBC: 6.8 10*3/uL (ref 4.0–10.5)
nRBC: 0 % (ref 0.0–0.2)

## 2020-10-20 LAB — PHOSPHORUS
Phosphorus: 4.6 mg/dL (ref 2.5–4.6)
Phosphorus: 5 mg/dL — ABNORMAL HIGH (ref 2.5–4.6)

## 2020-10-20 LAB — MAGNESIUM
Magnesium: 0.9 mg/dL — CL (ref 1.7–2.4)
Magnesium: 1.7 mg/dL (ref 1.7–2.4)

## 2020-10-20 MED ORDER — MAGNESIUM SULFATE 2 GM/50ML IV SOLN
2.0000 g | Freq: Once | INTRAVENOUS | Status: AC
  Administered 2020-10-20: 2 g via INTRAVENOUS
  Filled 2020-10-20: qty 50

## 2020-10-20 MED ORDER — MIDODRINE HCL 5 MG PO TABS
5.0000 mg | ORAL_TABLET | Freq: Three times a day (TID) | ORAL | Status: DC
  Administered 2020-10-20 – 2020-10-21 (×3): 5 mg via ORAL
  Filled 2020-10-20 (×3): qty 1

## 2020-10-20 MED ORDER — CALCIUM CARBONATE 1250 (500 CA) MG PO TABS
1.0000 | ORAL_TABLET | Freq: Two times a day (BID) | ORAL | Status: DC
  Administered 2020-10-20 – 2020-10-26 (×13): 500 mg via ORAL
  Filled 2020-10-20 (×14): qty 1

## 2020-10-20 MED ORDER — CALCIUM GLUCONATE-NACL 1-0.675 GM/50ML-% IV SOLN
1.0000 g | Freq: Once | INTRAVENOUS | Status: AC
  Administered 2020-10-20: 1000 mg via INTRAVENOUS
  Filled 2020-10-20: qty 50

## 2020-10-20 MED ORDER — TIZANIDINE HCL 2 MG PO TABS
2.0000 mg | ORAL_TABLET | Freq: Three times a day (TID) | ORAL | Status: DC
  Administered 2020-10-20 – 2020-10-26 (×18): 2 mg via ORAL
  Filled 2020-10-20 (×20): qty 1

## 2020-10-20 NOTE — Progress Notes (Signed)
Deanna Baldwin [phone] Manufacturing engineer [facility]) Hospital Liaison note:  This patient is currently enrolled in [facility] outpatient-based Palliative Care. Will continue to follow for disposition.  Please call with any outpatient palliative questions or concerns.  Thank you, Lorelee Market, LPN [facility] Liaison [phone]

## 2020-10-20 NOTE — Progress Notes (Addendum)
PROGRESS NOTE    Deanna Baldwin  HFW:[id number] DOB: May 02, 1975 DOA: 10/13/2020 PCP: [name], MD   Chief Complaint  Patient presents with   Medical Evaluation   Abnormal Lab    Brief Narrative/Hospital Course: 66 old female with history of hypothyroidism, MS with contractures of her legs neuromuscular dysfunctions of bladder, status post urostomy, multiple pressure ulcers, with ambulatory dysfunction/sedentary, history of hypotension on midodrine, hypothyroidism from [place] sent to the ED for electrolyte abnormality Apparently patient was getting IV antibiotics for change in mental status at the facility. In the ED had leukocytosis severely elevated calcium level creatinine 3.0 EKG NSR, nephro was consulted and patient was admitted.  Managed with aggressive IV hydration calcitonin serial calcium monitoring.  PTH was appropriately suppressed, vitamin D urine calcium nl. Managed with calcitonin x48 hours, aggressive fluid and bisphosphonates x1 with resolution of hypercalcemia. Patient had fever spikes tachycardia soft blood pressure transfer to stepdown after placing on IV antibiotics underwent CT chest abdomen pelvis-no clear source.  Blood cultures were sent and negative so far  Subjective: Seen this morning offers no complaints.  Low mag and low calcium and repeat labs ordered.  She reports her blood pressure has been soft for many years.  Assessment & Plan:  FUO on 9/16: Mild fever low-grade overnight.  No clear source.  Underwent CT chest abdomen pelvis underling, blood culture no growth.  On Vanco/Zosyn since 9/16 off Vanco and Zosyn for now empirically keep the same.  Severe hypercalcemia likely from volume contraction.  Status post calcitonin and bisphosphonates 9/15.  PTH suppressed urine calcium 25 hydroxy vitamin D normal.  Now having hyocalcemia.  Seen by nephrology.  Hypocalcemia recheck corrected calcium 6.9 and downtrending and replete  with 1 g calcium IV  gluconate, add p.o. and also secondary to calcium. Thsi is likely from bisphosphonates. Discussed with Dr Justin Mend from nephrology re the plan as above.  AKI-resolved. At 1.2. Encourage oral intake Recent Labs  Lab 10/17/20 0354 10/18/20 0254 10/19/20 0106 10/20/20 0117 10/20/20 1005  BUN 22* 17 21* 16 11  CREATININE 1.05* 1.21* 1.20* 1.23* 0.27*   Mild metabolic acidosis monitor labs Hypernatremia.resolved Hypomagnesemia-repleted recheck Hypophosphatemia-repleted recheck  Transaminitis:?cause. Monitor lfts.  If remains persistently high will check hepatitis panel.  Avoid hepatotoxic medication.  Leukocytosis resolved  Chronic hypotension on midodrine dose increased to 5 mg.  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  FEN: Diet Order             Diet renal with fluid restriction Fluid restriction: Other (see comments); Room service appropriate? Yes with Assist; Fluid consistency: Thin  Diet effective now                   Nutrition Problem: Inadequate oral intake Etiology: poor appetite Signs/Symptoms: meal completion < 50% Interventions: Ensure Enlive (each supplement provides 350kcal and 20 grams of protein), MVI   DVT prophylaxis: heparin injection 5,000 Units Start: 10/14/20 1400 Place TED hose Start: 10/14/20 1030 Code Status:   Code Status: Full Code  Family Communication: plan of care discussed with patient at bedside.  Status is: Inpatient  Remains inpatient appropriate because:IV treatments appropriate due to intensity of illness or inability to take PO  and  Inpatient level of care appropriate due to severity of illness  Dispo: The patient is from:  [place] home              Anticipated d/c is to:  :  [facility] home              Patient currently is not medically stable to d/c.   Difficult to place patient No Objective: Vitals: Today's Vitals   10/20/20 0122 10/20/20 0400 10/20/20 0816 10/20/20 1130  BP:  93/67 (!) 89/62 97/72  Pulse:  99 (!) 106 97  Resp:  18 18 17   Temp:  98.2 F (36.8 C) 97.7 F (36.5 C)   TempSrc:  Oral Oral   SpO2:   98% 96%  Weight:      Height:      PainSc: Asleep  0-No pain    Examination: General exam: AA0X3, cachectic, thin weaD & older than stated age HEENT:Oral mucosa moist, Ear/Nose WNL grossly,dentition normal. Respiratory system: bilaterally clear breath sounds no use of accessory muscle, non tender. Cardiovascular system: S1 & S2 +,No JVD. Gastrointestinal system: Abdomen soft, NT,ND, BS+.  Urostomy present Nervous System:Alert, awake, moving extremities Extremities: NO edema, contractures lower extremities distal peripheral pulses palpable.  Skin: No rashes,no icterus. MSK: thin muscle bulk,tone, power   Intake/Output Summary (Last 24 hours) at 10/20/2020 1140 Last data filed at 10/20/2020 0536 Gross per 24 hour  Intake 1217.6 ml  Output 1900 ml  Net -682.4 ml   Filed Weights   10/15/20 2047 10/17/20 2059  Weight: 46.8 kg 42.7 kg   Weight change:    Consultants:see note  Procedures:see note Antimicrobials: Anti-infectives (From admission, onward)    Start     Dose/Rate Route Frequency Ordered Stop   10/18/20 1300  piperacillin-tazobactam (ZOSYN) IVPB 3.375 g        3.375 g 12.5 mL/hr over 240 Minutes Intravenous Every 8 hours 10/18/20 0533     10/18/20 0600  piperacillin-tazobactam (ZOSYN) IVPB 3.375 g        3.375 g 100 mL/hr over 30 Minutes Intravenous STAT 10/18/20 0533 10/18/20 0832   10/18/20 0500  vancomycin (VANCOCIN) IVPB 1000 mg/200 mL premix   Status:  Discontinued        1,000 mg 200 mL/hr over 60 Minutes Intravenous Every 48 hours 10/18/20 0445 10/19/20 1546   10/17/20 2200  cefTRIAXone (ROCEPHIN) 1 g in sodium chloride 0.9 % 100 mL IVPB  Status:  Discontinued        1 g 200 mL/hr over 30 Minutes Intravenous Every 24 hours 10/17/20 2149 10/18/20 0528      Culture/Microbiology    Component Value Date/Time   SDES BLOOD RIGHT ANTECUBITAL 10/18/2020 0254   SDES BLOOD RIGHT HAND 10/18/2020 0254   SPECREQUEST  10/18/2020 0254    BOTTLES DRAWN AEROBIC AND ANAEROBIC Blood Culture adequate volume   SPECREQUEST  10/18/2020 0254    BOTTLES DRAWN AEROBIC AND ANAEROBIC Blood Culture adequate volume   CULT  10/18/2020 0254    NO GROWTH 2 DAYS Performed at [facility], [place] [address], [place], [place] [address]    CULT  10/18/2020 0254    NO GROWTH 2 DAYS Performed at [facility], [place] [address], [place], [place] [address]    REPTSTATUS PENDING 10/18/2020 0254   REPTSTATUS PENDING 10/18/2020 0254    Other culture-see note  Unresulted Labs (From admission, onward)     Start  Ordered   10/15/20 1124  PTH-related peptide  Once,   STAT        10/15/20 1123   Unscheduled  Calcium, ionized  Add-on,   R        10/20/20 1140           Medications reviewed:  Scheduled Meds:  calcium carbonate  1 tablet Oral BID WC   feeding supplement  237 mL Oral TID BM   heparin  5,000 Units Subcutaneous Q8H   levothyroxine  100 mcg Oral QAC breakfast   midodrine  5 mg Oral TID WC   multivitamin with minerals  1 tablet Oral Daily   pantoprazole  40 mg Oral Daily   tizanidine  2 mg Oral TID   vitamin B-12  500 mcg Oral Daily   Continuous Infusions:  sodium chloride 75 mL/hr at 10/20/20 0333   piperacillin-tazobactam (ZOSYN)  IV 3.375 g (10/20/20 0524)   Intake/Output from previous day: 09/18 0701 - 09/19 0700 In: 1217.6 [I.V.:1117.6; IV Piggyback:100] Out: 9892 [Urine:2200; Emesis/NG output:25;  Stool:120] Intake/Output this shift: No intake/output data recorded. Filed Weights   10/15/20 2047 10/17/20 2059  Weight: 46.8 kg 42.7 kg   Data Reviewed: I have personally reviewed following labs and imaging studies CBC: Recent Labs  Lab 10/13/20 2139 10/14/20 1418 10/16/20 0318 10/18/20 0254 10/19/20 0614 10/19/20 1104 10/20/20 0117  WBC 12.7*   < > 10.4 9.5 8.4 8.4 6.8  NEUTROABS 10.7*  --   --  8.6*  --   --   --   HGB 11.2*   < > 8.9* 10.9* 7.7* 8.8* 7.6*  HCT 36.3   < > 28.0* 33.9* 24.1* 27.3* 23.4*  MCV 95.3   < > 93.3 90.9 91.6 92.5 91.8  PLT 408*   < > 334 292 248 266 267   < > = values in this interval not displayed.   Basic Metabolic Panel: Recent Labs  Lab 10/14/20 0215 10/15/20 0449 10/16/20 0318 10/17/20 0354 10/18/20 0254 10/19/20 0106 10/20/20 0117 10/20/20 1005  NA 147*   < > 152* 145 146* 139 141 142  K 3.7   < > 3.7 4.2 4.1 4.8 3.4* 3.8  CL 111   < > 121* 113* 111 106 109 109  CO2 30   < > 23 20* 24 22 19* 18*  GLUCOSE 82   < > 88 83 98 83 100* 118*  BUN 106*   < > 37* 22* 17 21* 16 11  CREATININE 2.88*   < > 1.43* 1.05* 1.21* 1.20* 1.23* 1.20*  CALCIUM >15.0*   < > 10.5* 9.8 8.3* 6.7* 5.7* 5.5*  MG  --    < > 2.0 1.3* 2.1  --  0.9* 1.7  PHOS 2.6  --   --  1.1* 4.0  --  4.6 5.0*   < > = values in this interval not displayed.   GFR: Estimated Creatinine Clearance: 40.3 mL/min (A) (by C-G formula based on SCr of 1.2 mg/dL (H)). Liver Function Tests: Recent Labs  Lab 10/16/20 0318 10/17/20 0354 10/19/20 0106 10/20/20 0117 10/20/20 1005  AST 22 18 110* 128* 124*  ALT <5 6 50* 66* 69*  ALKPHOS 68 71 109 153* 181*  BILITOT 0.3 0.6 0.5 0.3 0.6  PROT 5.7* 5.4* 5.4* 5.5* 5.9*  ALBUMIN 2.6* 2.5* 2.2* 2.1* 2.3*   No results for input(s): LIPASE, AMYLASE in the last 168 hours. No results for input(s): AMMONIA in the last 168 hours. Coagulation  Profile: No results for input(s): INR, PROTIME in the last 168 hours. Cardiac Enzymes: No results  for input(s): CKTOTAL, CKMB, CKMBINDEX, TROPONINI in the last 168 hours. BNP (last 3 results) No results for input(s): PROBNP in the last 8760 hours. HbA1C: No results for input(s): HGBA1C in the last 72 hours. CBG: No results for input(s): GLUCAP in the last 168 hours. Lipid Profile: No results for input(s): CHOL, HDL, LDLCALC, TRIG, CHOLHDL, LDLDIRECT in the last 72 hours. Thyroid Function Tests: No results for input(s): TSH, T4TOTAL, FREET4, T3FREE, THYROIDAB in the last 72 hours. Anemia Panel: No results for input(s): VITAMINB12, FOLATE, FERRITIN, TIBC, IRON, RETICCTPCT in the last 72 hours. Sepsis Labs: Recent Labs  Lab 10/18/20 8850  LATICACIDVEN 0.6    Recent Results (from the past 240 hour(s))  Resp Panel by RT-PCR (Flu A&B, Covid) Nasopharyngeal Swab     Status: None   Collection Time: 10/13/20  8:38 PM   Specimen: Nasopharyngeal Swab; Nasopharyngeal(NP) swabs in vial transport medium  Result Value Ref Range Status   SARS Coronavirus 2 by RT PCR NEGATIVE NEGATIVE Final    Comment: (NOTE) SARS-CoV-2 target nucleic acids are NOT DETECTED.  The SARS-CoV-2 RNA is generally detectable in upper respiratory specimens during the acute phase of infection. The lowest concentration of SARS-CoV-2 viral copies this assay can detect is 138 copies/mL. A negative result does not preclude SARS-Cov-2 infection and should not be used as the sole basis for treatment or other patient management decisions. A negative result may occur with  improper specimen collection/handling, submission of specimen other than nasopharyngeal swab, presence of viral mutation(s) within the areas targeted by this assay, and inadequate number of viral copies(<138 copies/mL). A negative result must be combined with clinical observations, patient history, and epidemiological information. The expected result is Negative.  Fact Sheet for Patients:  EntrepreneurPulse.com.au  Fact Sheet for  Healthcare Providers:  IncredibleEmployment.be  This test is no t yet approved or cleared by the [place] FDA and  has been authorized for detection and/or diagnosis of SARS-CoV-2 by FDA under an Emergency Use Authorization (EUA). This EUA will remain  in effect (meaning this test can be used) for the duration of the COVID-19 declaration under Section 564(b)(1) of the Act, 21 U.S.C.section 360bbb-3(b)(1), unless the authorization is terminated  or revoked sooner.       Influenza A by PCR NEGATIVE NEGATIVE Final   Influenza B by PCR NEGATIVE NEGATIVE Final    Comment: (NOTE) The Xpert Xpress SARS-CoV-2/FLU/RSV plus assay is intended as an aid in the diagnosis of influenza from Nasopharyngeal swab specimens and should not be used as a sole basis for treatment. Nasal washings and aspirates are unacceptable for Xpert Xpress SARS-CoV-2/FLU/RSV testing.  Fact Sheet for Patients: EntrepreneurPulse.com.au  Fact Sheet for Healthcare Providers: IncredibleEmployment.be  This test is not yet approved or cleared by the [place] FDA and has been authorized for detection and/or diagnosis of SARS-CoV-2 by FDA under an Emergency Use Authorization (EUA). This EUA will remain in effect (meaning this test can be used) for the duration of the COVID-19 declaration under Section 564(b)(1) of the Act, 21 U.S.C. section 360bbb-3(b)(1), unless the authorization is terminated or revoked.  Performed at [facility], [place] [address] [place], [place] [address]   SARS CORONAVIRUS 2 (TAT 6-24 HRS) Nasopharyngeal Nasopharyngeal Swab     Status: None   Collection Time: 10/17/20  5:17 PM   Specimen: Nasopharyngeal Swab  Result Value Ref Range Status   SARS  Coronavirus 2 NEGATIVE NEGATIVE Final    Comment: (NOTE) SARS-CoV-2 target nucleic acids are NOT DETECTED.  The SARS-CoV-2 RNA is generally detectable in upper and lower respiratory  specimens during the acute phase of infection. Negative results do not preclude SARS-CoV-2 infection, do not rule out co-infections with other pathogens, and should not be used as the sole basis for treatment or other patient management decisions. Negative results must be combined with clinical observations, patient history, and epidemiological information. The expected result is Negative.  Fact Sheet for Patients: SugarRoll.be  Fact Sheet for Healthcare Providers: https://www.woods-mathews.com/  This test is not yet approved or cleared by the [place] FDA and  has been authorized for detection and/or diagnosis of SARS-CoV-2 by FDA under an Emergency Use Authorization (EUA). This EUA will remain  in effect (meaning this test can be used) for the duration of the COVID-19 declaration under Se ction 564(b)(1) of the Act, 21 U.S.C. section 360bbb-3(b)(1), unless the authorization is terminated or revoked sooner.  Performed at [facility], [place] [address] [place], [place] [address]   Culture, blood (routine x 2)     Status: None (Preliminary result)   Collection Time: 10/18/20  2:54 AM   Specimen: BLOOD  Result Value Ref Range Status   Specimen Description BLOOD RIGHT ANTECUBITAL  Final   Special Requests   Final    BOTTLES DRAWN AEROBIC AND ANAEROBIC Blood Culture adequate volume   Culture   Final    NO GROWTH 2 DAYS Performed at [facility], [place] [address], [place], [place] [address]    Report Status PENDING  Incomplete  Culture, blood (routine x 2)     Status: None (Preliminary result)   Collection Time: 10/18/20  2:54 AM   Specimen: BLOOD RIGHT HAND  Result Value Ref Range Status   Specimen Description BLOOD RIGHT HAND  Final   Special Requests   Final    BOTTLES DRAWN AEROBIC AND ANAEROBIC Blood Culture adequate volume   Culture   Final    NO GROWTH 2 DAYS Performed at [facility], [place] [address],  [place], [place] [address]    Report Status PENDING  Incomplete  Resp Panel by RT-PCR (Flu A&B, Covid) Nasopharyngeal Swab     Status: None   Collection Time: 10/18/20  9:20 AM   Specimen: Nasopharyngeal Swab; Nasopharyngeal(NP) swabs in vial transport medium  Result Value Ref Range Status   SARS Coronavirus 2 by RT PCR NEGATIVE NEGATIVE Final    Comment: (NOTE) SARS-CoV-2 target nucleic acids are NOT DETECTED.  The SARS-CoV-2 RNA is generally detectable in upper respiratory specimens during the acute phase of infection. The lowest concentration of SARS-CoV-2 viral copies this assay can detect is 138 copies/mL. A negative result does not preclude SARS-Cov-2 infection and should not be used as the sole basis for treatment or other patient management decisions. A negative result may occur with  improper specimen collection/handling, submission of specimen other than nasopharyngeal swab, presence of viral mutation(s) within the areas targeted by this assay, and inadequate number of viral copies(<138 copies/mL). A negative result must be combined with clinical observations, patient history, and epidemiological information. The expected result is Negative.  Fact Sheet for Patients:  EntrepreneurPulse.com.au  Fact Sheet for Healthcare Providers:  IncredibleEmployment.be  This test is no t yet approved or cleared by the [place] FDA and  has been authorized for detection and/or diagnosis of SARS-CoV-2 by FDA under an Emergency Use Authorization (EUA). This  EUA will remain  in effect (meaning this test can be used) for the duration of the COVID-19 declaration under Section 564(b)(1) of the Act, 21 U.S.C.section 360bbb-3(b)(1), unless the authorization is terminated  or revoked sooner.       Influenza A by PCR NEGATIVE NEGATIVE Final   Influenza B by PCR NEGATIVE NEGATIVE Final    Comment: (NOTE) The Xpert Xpress SARS-CoV-2/FLU/RSV plus assay is  intended as an aid in the diagnosis of influenza from Nasopharyngeal swab specimens and should not be used as a sole basis for treatment. Nasal washings and aspirates are unacceptable for Xpert Xpress SARS-CoV-2/FLU/RSV testing.  Fact Sheet for Patients: EntrepreneurPulse.com.au  Fact Sheet for Healthcare Providers: IncredibleEmployment.be  This test is not yet approved or cleared by the [place] FDA and has been authorized for detection and/or diagnosis of SARS-CoV-2 by FDA under an Emergency Use Authorization (EUA). This EUA will remain in effect (meaning this test can be used) for the duration of the COVID-19 declaration under Section 564(b)(1) of the Act, 21 U.S.C. section 360bbb-3(b)(1), unless the authorization is terminated or revoked.  Performed at [facility], [place] [address] [place], [place] [address]      Radiology Studies: CT CHEST ABDOMEN PELVIS W CONTRAST  Result Date: 10/18/2020 CLINICAL DATA:  Sepsis EXAM: CT CHEST, ABDOMEN, AND PELVIS WITH CONTRAST TECHNIQUE: Multidetector CT imaging of the chest, abdomen and pelvis was performed following the standard protocol during bolus administration of intravenous contrast. CONTRAST:  12mL OMNIPAQUE IOHEXOL 300 MG/ML SOLN, additional oral enteric contrast COMPARISON:  CT chest, 08/02/2017 CT abdomen pelvis, 06/25/2008 FINDINGS: CT CHEST FINDINGS Cardiovascular: No significant vascular findings. Normal heart size. No pericardial effusion. Mediastinum/Nodes: No enlarged mediastinal, hilar, or axillary lymph nodes. Thyroid gland, trachea, and esophagus demonstrate no significant findings. Lungs/Pleura: Small bilateral pleural effusions. Dense fibrotic scarring, bronchiectasis, and or consolidation of the dependent left lower lobe (series 5, image 104). Musculoskeletal: No chest wall mass or suspicious bone lesions identified. CT ABDOMEN PELVIS FINDINGS Hepatobiliary: No solid liver  abnormality is seen. No gallstones, gallbladder wall thickening, or biliary dilatation. Pancreas: Unremarkable. No pancreatic ductal dilatation or surrounding inflammatory changes. Spleen: Normal in size without significant abnormality. Adrenals/Urinary Tract: Adrenal glands are unremarkable. Mild left, moderate right hydronephrosis and hydroureter, the distal ureters very poorly visualized but without obvious obstructing calculi or other lesion. Stomach/Bowel: Stomach is within normal limits. Appendix appears normal. No evidence of bowel wall thickening, distention, or inflammatory changes. Vascular/Lymphatic: No significant vascular findings are present. No enlarged abdominal or pelvic lymph nodes. Reproductive: No mass or other abnormality. Other: No abdominal wall hernia or abnormality. No abdominopelvic ascites. Musculoskeletal: Decubitus wounds of the sacrum (series 3, image 95), bilateral ischial rami (series 3, image 121) and overlying the left greater trochanter (series 3, image 122). There is underlying sclerosis without bony erosion. IMPRESSION: 1. Small bilateral pleural effusions. 2. Dense fibrotic scarring, bronchiectasis, and or consolidation of the dependent left lower lobe. Findings are consistent with acute and/or chronic sequelae of infection or aspiration. 3. Mild left, moderate right hydronephrosis and hydroureter, the distal ureters very poorly visualized but without obvious obstructing calculi or other lesion. 4. Decubitus wounds of the sacrum, bilateral ischial rami and overlying the left greater trochanter. There is underlying sclerosis without acute appearing bony erosion. MRI is the test of choice to assess for osteomyelitis if clinically suspected. Electronically Signed   By: Eddie Candle M.D.   On: 10/18/2020 15:06     LOS: 6 days  Antonieta Pert, MD [facility]  10/20/2020, 11:40 AM

## 2020-10-21 LAB — CBC
HCT: 22.1 % — ABNORMAL LOW (ref 36.0–46.0)
Hemoglobin: 7 g/dL — ABNORMAL LOW (ref 12.0–15.0)
MCH: 29.2 pg (ref 26.0–34.0)
MCHC: 31.7 g/dL (ref 30.0–36.0)
MCV: 92.1 fL (ref 80.0–100.0)
Platelets: 321 10*3/uL (ref 150–400)
RBC: 2.4 MIL/uL — ABNORMAL LOW (ref 3.87–5.11)
RDW: 15 % (ref 11.5–15.5)
WBC: 8.9 10*3/uL (ref 4.0–10.5)
nRBC: 0 % (ref 0.0–0.2)

## 2020-10-21 LAB — BASIC METABOLIC PANEL
Anion gap: 12 (ref 5–15)
BUN: 12 mg/dL (ref 6–20)
CO2: 19 mmol/L — ABNORMAL LOW (ref 22–32)
Calcium: 6 mg/dL — CL (ref 8.9–10.3)
Chloride: 112 mmol/L — ABNORMAL HIGH (ref 98–111)
Creatinine, Ser: 0.95 mg/dL (ref 0.44–1.00)
GFR, Estimated: >60 mL/min (ref >60)
Glucose, Bld: 122 mg/dL — ABNORMAL HIGH (ref 70–99)
Potassium: 4.3 mmol/L (ref 3.5–5.1)
Sodium: 143 mmol/L (ref 135–145)

## 2020-10-21 LAB — COMPREHENSIVE METABOLIC PANEL
ALT: 61 U/L — ABNORMAL HIGH (ref 0–44)
AST: 82 U/L — ABNORMAL HIGH (ref 15–41)
Albumin: 2.1 g/dL — ABNORMAL LOW (ref 3.5–5.0)
Alkaline Phosphatase: 183 U/L — ABNORMAL HIGH (ref 38–126)
Anion gap: 12 (ref 5–15)
BUN: 13 mg/dL (ref 6–20)
CO2: 18 mmol/L — ABNORMAL LOW (ref 22–32)
Calcium: 5.6 mg/dL — CL (ref 8.9–10.3)
Chloride: 109 mmol/L (ref 98–111)
Creatinine, Ser: 1.02 mg/dL — ABNORMAL HIGH (ref 0.44–1.00)
GFR, Estimated: >60 mL/min (ref >60)
Glucose, Bld: 104 mg/dL — ABNORMAL HIGH (ref 70–99)
Potassium: 3 mmol/L — ABNORMAL LOW (ref 3.5–5.1)
Sodium: 139 mmol/L (ref 135–145)
Total Bilirubin: 0.1 mg/dL — ABNORMAL LOW (ref 0.3–1.2)
Total Protein: 5.4 g/dL — ABNORMAL LOW (ref 6.5–8.1)

## 2020-10-21 LAB — MAGNESIUM: Magnesium: 1.1 mg/dL — ABNORMAL LOW (ref 1.7–2.4)

## 2020-10-21 LAB — CALCIUM, IONIZED: Calcium, Ionized, Serum: 3.1 mg/dL — ABNORMAL LOW (ref 4.5–5.6)

## 2020-10-21 MED ORDER — CALCIUM GLUCONATE-NACL 1-0.675 GM/50ML-% IV SOLN
1.0000 g | Freq: Once | INTRAVENOUS | Status: AC
  Administered 2020-10-21: 1000 mg via INTRAVENOUS
  Filled 2020-10-21: qty 50

## 2020-10-21 MED ORDER — MIDODRINE HCL 5 MG PO TABS
2.5000 mg | ORAL_TABLET | Freq: Three times a day (TID) | ORAL | Status: DC
  Administered 2020-10-21 – 2020-10-24 (×10): 2.5 mg via ORAL
  Filled 2020-10-21 (×10): qty 1

## 2020-10-21 MED ORDER — POTASSIUM CHLORIDE CRYS ER 20 MEQ PO TBCR
40.0000 meq | EXTENDED_RELEASE_TABLET | Freq: Once | ORAL | Status: AC
  Administered 2020-10-21: 40 meq via ORAL
  Filled 2020-10-21: qty 2

## 2020-10-21 MED ORDER — POTASSIUM CHLORIDE CRYS ER 20 MEQ PO TBCR
20.0000 meq | EXTENDED_RELEASE_TABLET | Freq: Every day | ORAL | Status: DC
  Administered 2020-10-21 – 2020-10-26 (×6): 20 meq via ORAL
  Filled 2020-10-21 (×6): qty 1
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
## 2020-10-21 NOTE — Progress Notes (Signed)
PROGRESS NOTE    Deanna Baldwin  HFW:[id number] DOB: 05-26-1975 DOA: 10/13/2020 PCP: [name], MD   Chief Complaint  Patient presents with   Medical Evaluation   Abnormal Lab    Brief Narrative/Hospital Course: 73 old female with history of hypothyroidism, MS with contractures of her legs neuromuscular dysfunctions of bladder, status post urostomy, multiple pressure ulcers, with ambulatory dysfunction/sedentary, history of hypotension on midodrine, hypothyroidism from [place] sent to the ED for electrolyte abnormality Apparently patient was getting IV antibiotics for change in mental status at the facility. In the ED had leukocytosis severely elevated calcium level creatinine 3.0 EKG NSR, nephro was consulted and patient was admitted.  Managed with aggressive IV hydration calcitonin serial calcium monitoring.  PTH was appropriately suppressed, vitamin D urine calcium nl. Managed with calcitonin x48 hours, aggressive fluid and bisphosphonates x1 with resolution of hypercalcemia. Patient had fever spikes tachycardia soft blood pressure transfer to stepdown after placing on IV antibiotics- vancomycin Zosyn- underwent CT chest abdomen pelvis-no clear source.  Blood cultures were sent and negative so far-off vancomycin 9/20. Patient having hypocalcemia and being replaced.  Subjective: Seen and examined this morning. Again overnight potassium low calcium low but afebrile. Hemoglobin downtrending. Reports he is drinking well denies any complaints.  Assessment & Plan:  FUO on 9/16: No further recurrence.  No clear source of infection.Underwent CT chest abdomen pelvis unyielding, blood culture no growth so far. Keep on On Zosyn ( 9/16) off Vanco.  If afebrile tomorrow can hold off further antibiotics and monitor  Severe hypercalcemia likely from volume contraction.  Status post calcitonin and bisphosphonates 9/15.  PTH suppressed urine calcium 25 hydroxy vitamin D normal.  Now  having hyocalcemia.  Seen by nephrology.  Hypocalcemia suspecting overcorrection -patient is managed for hypercalcemia as above.  Calcium remains low ionized calcium pending and discussed w/ Dr. Justice Deeds oral calcium supplementation, redose w/ iv calcium gluconate this morning repeat calcium level this afternoon.   AKI-resolved. At 1.2. Encourage oral intake Recent Labs  Lab 10/18/20 0254 10/19/20 0106 10/20/20 0117 10/20/20 1005 10/21/20 0145  BUN 17 21* 16 11 13   CREATININE 1.21* 1.20* 1.23* 1.20* 0.27*   Mild metabolic acidosis monitor labs Hypernatremia.resolved Hypomagnesemia-resolved Hypophosphatemia-repleted.  Transaminitis:?cause. Monitor lfts.  If remains persistently high will check hepatitis panel.  Avoid hepatotoxic medication.  Leukocytosis resolved  Chronic hypotension on midodrine dose increased to 5 mg.  Hypothyroidism continue Synthroid check TSH in 4-week  Moderate bilateral hydronephrosis/9 mm right lower pole calculus with dysfunctional bladder urostomy in place outpatient follow-up  Anemia of chronic disease monitor hemoglobin transfuse for less than 7 g. Hb donwtrending.  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  FEN: Diet Order             Diet renal with fluid restriction Fluid restriction: Other (see comments); Room service appropriate? Yes with Assist; Fluid consistency: Thin  Diet effective now                   Nutrition Problem: Inadequate oral intake Etiology: poor appetite Signs/Symptoms: meal completion < 50% Interventions: Ensure Enlive (each supplement provides 350kcal and 20 grams of protein), MVI   DVT prophylaxis: heparin injection 5,000 Units Start: 10/14/20 1400 Place TED hose Start: 10/14/20 1030 Code Status:   Code Status: Full Code  Family Communication: plan of care discussed with  patient at bedside.  Status is: Inpatient  Remains inpatient appropriate because:IV treatments appropriate  due to intensity of illness or inability to take PO and Inpatient level of care appropriate due to severity of illness  Dispo: The patient is from:  [place] home              Anticipated d/c is to:  :  [place] home next on next day or 2 once calcium improves              Patient currently is not medically stable to d/c.   Difficult to place patient No Objective: Vitals: Today's Vitals   10/21/20 0337 10/21/20 0617 10/21/20 0721 10/21/20 0800  BP: 95/71  98/62 95/75  Pulse: (!) 112  (!) 117   Resp: 19  18 (!) 25  Temp: 98.2 F (36.8 C)  98.5 F (36.9 C)   TempSrc: Oral  Oral   SpO2: 97%  97%   Weight:      Height:      PainSc:  Asleep  0-No pain   Examination: General exam: AAOx 3, cachectic thin frail pleasant.  HEENT:Oral mucosa moist, Ear/Nose WNL grossly, dentition normal. Respiratory system: bilaterally air breath sounds, no use of accessory muscle Cardiovascular system: S1 & S2 +, No JVD,. Gastrointestinal system: Abdomen soft, NT,ND, BS+ Nervous System:Alert, awake, moving extremities contracture present in lower extremities Extremities: no edema, distal peripheral pulses palpable.  Skin: No rashes,no icterus. MSK: Normal muscle bulk,tone, power.  Urostomy in place   Intake/Output Summary (Last 24 hours) at 10/21/2020 1137 Last data filed at 10/21/2020 0824 Gross per 24 hour  Intake 240 ml  Output 1775 ml  Net -1535 ml   Filed Weights   10/15/20 2047 10/17/20 2059  Weight: 46.8 kg 42.7 kg   Weight change:    Consultants:see note  Procedures:see note Antimicrobials: Anti-infectives (From admission, onward)    Start     Dose/Rate Route Frequency Ordered Stop   10/18/20 1300  piperacillin-tazobactam (ZOSYN) IVPB 3.375 g        3.375 g 12.5 mL/hr over 240 Minutes Intravenous Every 8 hours 10/18/20 0533     10/18/20 0600   piperacillin-tazobactam (ZOSYN) IVPB 3.375 g        3.375 g 100 mL/hr over 30 Minutes Intravenous STAT 10/18/20 0533 10/18/20 0832   10/18/20 0500  vancomycin (VANCOCIN) IVPB 1000 mg/200 mL premix  Status:  Discontinued        1,000 mg 200 mL/hr over 60 Minutes Intravenous Every 48 hours 10/18/20 0445 10/19/20 1546   10/17/20 2200  cefTRIAXone (ROCEPHIN) 1 g in sodium chloride 0.9 % 100 mL IVPB  Status:  Discontinued        1 g 200 mL/hr over 30 Minutes Intravenous Every 24 hours 10/17/20 2149 10/18/20 0528      Culture/Microbiology    Component Value Date/Time   SDES BLOOD RIGHT ANTECUBITAL 10/18/2020 0254   SDES BLOOD RIGHT HAND 10/18/2020 0254   SPECREQUEST  10/18/2020 0254    BOTTLES DRAWN AEROBIC AND ANAEROBIC Blood Culture adequate volume   SPECREQUEST  10/18/2020 0254    BOTTLES DRAWN AEROBIC AND ANAEROBIC Blood Culture adequate volume   CULT  10/18/2020 0254    NO GROWTH 3 DAYS Performed at [facility], [place] [address], [place], [place] [address]    CULT  10/18/2020 0254    NO GROWTH 3 DAYS Performed at [facility], [place] [address], [place], [place] [address]    REPTSTATUS PENDING 10/18/2020 0254   REPTSTATUS PENDING 10/18/2020 0254  Other culture-see note  Unresulted Labs (From admission, onward)     Start     Ordered   10/21/20 0500  Comprehensive metabolic panel  Daily,   R      10/20/20 1154   10/21/20 0500  CBC  Daily,   R      10/20/20 1154   10/20/20 1141  Calcium, ionized  Add-on,   AD        10/20/20 1140   10/15/20 1124  PTH-related peptide  Once,   STAT        10/15/20 1123           Medications reviewed:  Scheduled Meds:  calcium carbonate  1 tablet Oral BID WC   feeding supplement  237 mL Oral TID BM   heparin  5,000 Units Subcutaneous Q8H   levothyroxine  100 mcg Oral QAC breakfast   midodrine  2.5 mg Oral TID WC   multivitamin with minerals  1 tablet Oral Daily   pantoprazole  40 mg Oral Daily   potassium chloride  20 mEq  Oral Daily   tiZANidine  2 mg Oral TID   vitamin B-12  500 mcg Oral Daily   Continuous Infusions:  sodium chloride 75 mL/hr at 10/20/20 1654   piperacillin-tazobactam (ZOSYN)  IV 3.375 g (10/21/20 0530)   Intake/Output from previous day: 09/19 0701 - 09/20 0700 In: 600 [P.O.:600] Out: 1650 [Urine:1650] Intake/Output this shift: Total I/O In: -  Out: 125 [Urine:125] Filed Weights   10/15/20 2047 10/17/20 2059  Weight: 46.8 kg 42.7 kg   Data Reviewed: I have personally reviewed following labs and imaging studies CBC: Recent Labs  Lab 10/18/20 0254 10/19/20 0614 10/19/20 1104 10/20/20 0117 10/21/20 0145  WBC 9.5 8.4 8.4 6.8 8.9  NEUTROABS 8.6*  --   --   --   --   HGB 10.9* 7.7* 8.8* 7.6* 7.0*  HCT 33.9* 24.1* 27.3* 23.4* 22.1*  MCV 90.9 91.6 92.5 91.8 92.1  PLT 292 248 266 267 035   Basic Metabolic Panel: Recent Labs  Lab 10/16/20 0318 10/17/20 0354 10/18/20 0254 10/19/20 0106 10/20/20 0117 10/20/20 1005 10/21/20 0145  NA 152* 145 146* 139 141 142 139  K 3.7 4.2 4.1 4.8 3.4* 3.8 3.0*  CL 121* 113* 111 106 109 109 109  CO2 23 20* 24 22 19* 18* 18*  GLUCOSE 88 83 98 83 100* 118* 104*  BUN 37* 22* 17 21* 16 11 13   CREATININE 1.43* 1.05* 1.21* 1.20* 1.23* 1.20* 1.02*  CALCIUM 10.5* 9.8 8.3* 6.7* 5.7* 5.5* 5.6*  MG 2.0 1.3* 2.1  --  0.9* 1.7  --   PHOS  --  1.1* 4.0  --  4.6 5.0*  --    GFR: Estimated Creatinine Clearance: 47.4 mL/min (A) (by C-G formula based on SCr of 1.02 mg/dL (H)). Liver Function Tests: Recent Labs  Lab 10/17/20 0354 10/19/20 0106 10/20/20 0117 10/20/20 1005 10/21/20 0145  AST 18 110* 128* 124* 82*  ALT 6 50* 66* 69* 61*  ALKPHOS 71 109 153* 181* 183*  BILITOT 0.6 0.5 0.3 0.6 0.1*  PROT 5.4* 5.4* 5.5* 5.9* 5.4*  ALBUMIN 2.5* 2.2* 2.1* 2.3* 2.1*   No results for input(s): LIPASE, AMYLASE in the last 168 hours. No results for input(s): AMMONIA in the last 168 hours. Coagulation Profile: No results for input(s): INR, PROTIME in  the last 168 hours. Cardiac Enzymes: No results for input(s): CKTOTAL, CKMB, CKMBINDEX, TROPONINI in the last 168 hours. BNP (  last 3 results) No results for input(s): PROBNP in the last 8760 hours. HbA1C: No results for input(s): HGBA1C in the last 72 hours. CBG: No results for input(s): GLUCAP in the last 168 hours. Lipid Profile: No results for input(s): CHOL, HDL, LDLCALC, TRIG, CHOLHDL, LDLDIRECT in the last 72 hours. Thyroid Function Tests: No results for input(s): TSH, T4TOTAL, FREET4, T3FREE, THYROIDAB in the last 72 hours. Anemia Panel: No results for input(s): VITAMINB12, FOLATE, FERRITIN, TIBC, IRON, RETICCTPCT in the last 72 hours. Sepsis Labs: Recent Labs  Lab 10/18/20 2440  LATICACIDVEN 0.6    Recent Results (from the past 240 hour(s))  Resp Panel by RT-PCR (Flu A&B, Covid) Nasopharyngeal Swab     Status: None   Collection Time: 10/13/20  8:38 PM   Specimen: Nasopharyngeal Swab; Nasopharyngeal(NP) swabs in vial transport medium  Result Value Ref Range Status   SARS Coronavirus 2 by RT PCR NEGATIVE NEGATIVE Final    Comment: (NOTE) SARS-CoV-2 target nucleic acids are NOT DETECTED.  The SARS-CoV-2 RNA is generally detectable in upper respiratory specimens during the acute phase of infection. The lowest concentration of SARS-CoV-2 viral copies this assay can detect is 138 copies/mL. A negative result does not preclude SARS-Cov-2 infection and should not be used as the sole basis for treatment or other patient management decisions. A negative result may occur with  improper specimen collection/handling, submission of specimen other than nasopharyngeal swab, presence of viral mutation(s) within the areas targeted by this assay, and inadequate number of viral copies(<138 copies/mL). A negative result must be combined with clinical observations, patient history, and epidemiological information. The expected result is Negative.  Fact Sheet for Patients:   EntrepreneurPulse.com.au  Fact Sheet for Healthcare Providers:  IncredibleEmployment.be  This test is no t yet approved or cleared by the [place] FDA and  has been authorized for detection and/or diagnosis of SARS-CoV-2 by FDA under an Emergency Use Authorization (EUA). This EUA will remain  in effect (meaning this test can be used) for the duration of the COVID-19 declaration under Section 564(b)(1) of the Act, 21 U.S.C.section 360bbb-3(b)(1), unless the authorization is terminated  or revoked sooner.       Influenza A by PCR NEGATIVE NEGATIVE Final   Influenza B by PCR NEGATIVE NEGATIVE Final    Comment: (NOTE) The Xpert Xpress SARS-CoV-2/FLU/RSV plus assay is intended as an aid in the diagnosis of influenza from Nasopharyngeal swab specimens and should not be used as a sole basis for treatment. Nasal washings and aspirates are unacceptable for Xpert Xpress SARS-CoV-2/FLU/RSV testing.  Fact Sheet for Patients: EntrepreneurPulse.com.au  Fact Sheet for Healthcare Providers: IncredibleEmployment.be  This test is not yet approved or cleared by the [place] FDA and has been authorized for detection and/or diagnosis of SARS-CoV-2 by FDA under an Emergency Use Authorization (EUA). This EUA will remain in effect (meaning this test can be used) for the duration of the COVID-19 declaration under Section 564(b)(1) of the Act, 21 U.S.C. section 360bbb-3(b)(1), unless the authorization is terminated or revoked.  Performed at [facility], [place] [address] [place], [place] [address]   SARS CORONAVIRUS 2 (TAT 6-24 HRS) Nasopharyngeal Nasopharyngeal Swab     Status: None   Collection Time: 10/17/20  5:17 PM   Specimen: Nasopharyngeal Swab  Result Value Ref Range Status   SARS Coronavirus 2 NEGATIVE NEGATIVE Final    Comment: (NOTE) SARS-CoV-2 target nucleic acids are NOT DETECTED.  The  SARS-CoV-2 RNA is generally detectable in upper and lower  respiratory specimens during the acute phase of infection. Negative results do not preclude SARS-CoV-2 infection, do not rule out co-infections with other pathogens, and should not be used as the sole basis for treatment or other patient management decisions. Negative results must be combined with clinical observations, patient history, and epidemiological information. The expected result is Negative.  Fact Sheet for Patients: SugarRoll.be  Fact Sheet for Healthcare Providers: https://www.woods-mathews.com/  This test is not yet approved or cleared by the [place] FDA and  has been authorized for detection and/or diagnosis of SARS-CoV-2 by FDA under an Emergency Use Authorization (EUA). This EUA will remain  in effect (meaning this test can be used) for the duration of the COVID-19 declaration under Se ction 564(b)(1) of the Act, 21 U.S.C. section 360bbb-3(b)(1), unless the authorization is terminated or revoked sooner.  Performed at [facility], [place] [address] [place], [place] [address]   Culture, blood (routine x 2)     Status: None (Preliminary result)   Collection Time: 10/18/20  2:54 AM   Specimen: BLOOD  Result Value Ref Range Status   Specimen Description BLOOD RIGHT ANTECUBITAL  Final   Special Requests   Final    BOTTLES DRAWN AEROBIC AND ANAEROBIC Blood Culture adequate volume   Culture   Final    NO GROWTH 3 DAYS Performed at [facility], [place] [address] [place] [address], [place], [place] [address]    Report Status PENDING  Incomplete  Culture, blood (routine x 2)     Status: None (Preliminary result)   Collection Time: 10/18/20  2:54 AM   Specimen: BLOOD RIGHT HAND  Result Value Ref Range Status   Specimen Description BLOOD RIGHT HAND  Final   Special Requests   Final    BOTTLES DRAWN AEROBIC AND ANAEROBIC Blood Culture adequate volume   Culture   Final    NO  GROWTH 3 DAYS Performed at [facility], [place] [address], [place], [place] [address]    Report Status PENDING  Incomplete  Resp Panel by RT-PCR (Flu A&B, Covid) Nasopharyngeal Swab     Status: None   Collection Time: 10/18/20  9:20 AM   Specimen: Nasopharyngeal Swab; Nasopharyngeal(NP) swabs in vial transport medium  Result Value Ref Range Status   SARS Coronavirus 2 by RT PCR NEGATIVE NEGATIVE Final    Comment: (NOTE) SARS-CoV-2 target nucleic acids are NOT DETECTED.  The SARS-CoV-2 RNA is generally detectable in upper respiratory specimens during the acute phase of infection. The lowest concentration of SARS-CoV-2 viral copies this assay can detect is 138 copies/mL. A negative result does not preclude SARS-Cov-2 infection and should not be used as the sole basis for treatment or other patient management decisions. A negative result may occur with  improper specimen collection/handling, submission of specimen other than nasopharyngeal swab, presence of viral mutation(s) within the areas targeted by this assay, and inadequate number of viral copies(<138 copies/mL). A negative result must be combined with clinical observations, patient history, and epidemiological information. The expected result is Negative.  Fact Sheet for Patients:  EntrepreneurPulse.com.au  Fact Sheet for Healthcare Providers:  IncredibleEmployment.be  This test is no t yet approved or cleared by the [place] FDA and  has been authorized for detection and/or diagnosis of SARS-CoV-2 by FDA under an Emergency Use Authorization (EUA). This EUA will remain  in effect (meaning this test can be used) for the duration of the COVID-19 declaration under Section 564(b)(1) of the Act, 21 U.S.C.section 360bbb-3(b)(1), unless  the authorization is terminated  or revoked sooner.       Influenza A by PCR NEGATIVE NEGATIVE Final   Influenza B by PCR NEGATIVE NEGATIVE Final     Comment: (NOTE) The Xpert Xpress SARS-CoV-2/FLU/RSV plus assay is intended as an aid in the diagnosis of influenza from Nasopharyngeal swab specimens and should not be used as a sole basis for treatment. Nasal washings and aspirates are unacceptable for Xpert Xpress SARS-CoV-2/FLU/RSV testing.  Fact Sheet for Patients: EntrepreneurPulse.com.au  Fact Sheet for Healthcare Providers: IncredibleEmployment.be  This test is not yet approved or cleared by the [place] FDA and has been authorized for detection and/or diagnosis of SARS-CoV-2 by FDA under an Emergency Use Authorization (EUA). This EUA will remain in effect (meaning this test can be used) for the duration of the COVID-19 declaration under Section 564(b)(1) of the Act, 21 U.S.C. section 360bbb-3(b)(1), unless the authorization is terminated or revoked.  Performed at [facility], [place] [address] [place], [place] [address]      Radiology Studies: No results found.   LOS: 7 days   Antonieta Pert, MD [facility]  10/21/2020, 11:37 AM

## 2020-10-21 NOTE — Progress Notes (Signed)
Pharmacy Antibiotic Note  Deanna Baldwin is a 45 y.o. female admitted on 10/13/2020 with sepsis.  Pharmacy has been consulted for vancomycin dosing.  Afebrile, WBC and platelets wnl.  Plan: Continue Zosyn 3.375gm IV q8h Monitor CBC, renal function, blood culture results as needed. Plan to complete therapy if patient continues to improve  Height: 4\' 11"  (149.9 cm) Weight: 42.7 kg (94 lb 2.2 oz) IBW/kg (Calculated) : 43.2  Temp (24hrs), Avg:98.4 F (36.9 C), Min:98.1 F (36.7 C), Max:98.9 F (37.2 C)  Recent Labs  Lab 10/18/20 0254 10/18/20 0633 10/19/20 0106 10/19/20 0614 10/19/20 1104 10/20/20 0117 10/20/20 1005 10/21/20 0145  WBC 9.5  --   --  8.4 8.4 6.8  --  8.9  CREATININE 1.21*  --  1.20*  --   --  1.23* 1.20* 1.02*  LATICACIDVEN  --  0.6  --   --   --   --   --   --      Estimated Creatinine Clearance: 47.4 mL/min (A) (by C-G formula based on SCr of 1.02 mg/dL (H)).    Allergies  Allergen Reactions   Natalizumab     Antimicrobials this admission: Ceftriaxone 9/16 x1 Vancomycin 9/17 >>9/18 Zosyn 9/17 >>  Microbiology results: 9/17 BCx: NGTD   Thank you for allowing pharmacy to be a part of this patient's care.  Donnald Garre, PharmD Clinical Pharmacist  Please check AMION for all [place] numbers After 10:00 PM, call [place] [phone]

## 2020-10-22 LAB — CBC
HCT: 24.7 % — ABNORMAL LOW (ref 36.0–46.0)
Hemoglobin: 7.9 g/dL — ABNORMAL LOW (ref 12.0–15.0)
MCH: 29.6 pg (ref 26.0–34.0)
MCHC: 32 g/dL (ref 30.0–36.0)
MCV: 92.5 fL (ref 80.0–100.0)
Platelets: 399 10*3/uL (ref 150–400)
RBC: 2.67 MIL/uL — ABNORMAL LOW (ref 3.87–5.11)
RDW: 15.3 % (ref 11.5–15.5)
WBC: 9.1 10*3/uL (ref 4.0–10.5)
nRBC: 0 % (ref 0.0–0.2)

## 2020-10-22 LAB — COMPREHENSIVE METABOLIC PANEL
ALT: 49 U/L — ABNORMAL HIGH (ref 0–44)
AST: 54 U/L — ABNORMAL HIGH (ref 15–41)
Albumin: 2.2 g/dL — ABNORMAL LOW (ref 3.5–5.0)
Alkaline Phosphatase: 225 U/L — ABNORMAL HIGH (ref 38–126)
Anion gap: 13 (ref 5–15)
BUN: 13 mg/dL (ref 6–20)
CO2: 16 mmol/L — ABNORMAL LOW (ref 22–32)
Calcium: 6 mg/dL — CL (ref 8.9–10.3)
Chloride: 114 mmol/L — ABNORMAL HIGH (ref 98–111)
Creatinine, Ser: 0.88 mg/dL (ref 0.44–1.00)
GFR, Estimated: >60 mL/min (ref >60)
Glucose, Bld: 88 mg/dL (ref 70–99)
Potassium: 4.2 mmol/L (ref 3.5–5.1)
Sodium: 143 mmol/L (ref 135–145)
Total Bilirubin: 0.5 mg/dL (ref 0.3–1.2)
Total Protein: 5.4 g/dL — ABNORMAL LOW (ref 6.5–8.1)

## 2020-10-22 LAB — PHOSPHORUS: Phosphorus: 5.2 mg/dL — ABNORMAL HIGH (ref 2.5–4.6)

## 2020-10-22 LAB — MAGNESIUM: Magnesium: 1 mg/dL — ABNORMAL LOW (ref 1.7–2.4)

## 2020-10-22 MED ORDER — MAGNESIUM SULFATE 4 GM/100ML IV SOLN
4.0000 g | Freq: Once | INTRAVENOUS | Status: AC
  Administered 2020-10-22: 4 g via INTRAVENOUS
  Filled 2020-10-22: qty 100

## 2020-10-22 MED ORDER — SODIUM CHLORIDE 0.9 % IV BOLUS: 250.0000 mL | Freq: Once | INTRAVENOUS | Status: DC

## 2020-10-22 MED ORDER — CALCITRIOL 0.25 MCG PO CAPS
0.2500 ug | ORAL_CAPSULE | Freq: Three times a day (TID) | ORAL | Status: AC
  Administered 2020-10-22 (×3): 0.25 ug via ORAL
  Filled 2020-10-22 (×3): qty 1

## 2020-10-22 MED ORDER — CALCIUM GLUCONATE-NACL 2-0.675 GM/100ML-% IV SOLN
2.0000 g | Freq: Once | INTRAVENOUS | Status: AC
  Administered 2020-10-22: 2000 mg via INTRAVENOUS
  Filled 2020-10-22: qty 100

## 2020-10-22 NOTE — Progress Notes (Signed)
PROGRESS NOTE    Deanna Baldwin  ZLD:[id number] DOB: 1975-09-08 DOA: 10/13/2020 PCP: [name], MD   Chief Complaint  Patient presents with   Medical Evaluation   Abnormal Lab    Brief Narrative/Hospital Course: 56 old female with history of hypothyroidism, MS with contractures of her legs neuromuscular dysfunctions of bladder, status post urostomy, multiple pressure ulcers, with ambulatory dysfunction/sedentary, history of hypotension on midodrine, hypothyroidism from [place] sent to the ED for electrolyte abnormality Apparently patient was getting IV antibiotics for change in mental status at the facility. In the ED had leukocytosis severely elevated calcium level creatinine 3.0 EKG NSR, nephro was consulted and patient was admitted.  Managed with aggressive IV hydration calcitonin serial calcium monitoring.  PTH was appropriately suppressed, vitamin D urine calcium nl. Managed with calcitonin x48 hours, aggressive fluid and bisphosphonates x1 with resolution of hypercalcemia. Patient had fever spikes tachycardia soft blood pressure transfer to stepdown after placing on IV antibiotics- vancomycin Zosyn- underwent CT chest abdomen pelvis-no clear source.  Blood cultures were sent and negative so far-off vancomycin 9/20. Patient having hypocalcemia and being replaced   Subjective: Seen this morning.  Patient reports she feels fine.  She has been tachycardiac 120s.  But asymptomatic. Overnight blood pressure has been soft since yesterday.  Again LABS w/ hypocalcemia and hypomagnesemia overnight  Assessment & Plan:  FUO on 9/16: No further recurrence.  No clear source of infection.Underwent CT chest abdomen pelvis unyielding, blood culture no growth so far. Keep on On Zosyn ( 9/16) off Vanco. , no fever we will discontinue Zosyn and monitor off antibiotics today.  Severe hypercalcemia likely from volume contraction.  Status post calcitonin and bisphosphonates 9/15.  PTH  suppressed urine calcium 25 hydroxy vitamin D normal.  Now having hyocalcemia. See below.  Hypocalcemia suspecting overcorrection/2/2 bisphosphonates--discussed w/ Dr. Justin Mend again today-advised calcitriol 0.25x3 doses today, IV calcium gluconate 4 g, magnesium sulfate and monitor   Hypomagnesemia repleting again this morning repeat in a.m.  AKI-resolved at 08 Mild metabolic acidosis monitor labs Hypernatremia.resolved Hypophosphatemia-resolved  Transaminitis:?cause. Monitor lfts.  If remains persistently high will check hepatitis panel.  Avoid hepatotoxic medication.  Leukocytosis resolved  Chronic hypotension on midodrine dose decreased to home dose 2.5 mg had some tachycardia likely from midodrine if blood pressure remains low can consider increasing to  5 mg.  Sinus tachycardia: Asymptomatic.  Likely with ongoing urostomy output keep on IV fluid hydration-continue 100 mill per hour, give 250 mill bolus  NSS x1 this morning.  Encouraged oral intake.Net IO Since Admission: -6,862.47 mL [10/22/20 0958]  net negative but not sure if completely accurate.  Hypothyroidism continue Synthroid check TSH in 4-week  Moderate bilateral hydronephrosis/9 mm right lower pole calculus with dysfunctional bladder urostomy in place outpatient follow-up  Anemia of chronic disease monitor hemoglobin transfuse for less than 7 g.  Hemoglobin fluctuating.  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  Diet Order             Diet regular Room service appropriate? Yes; Fluid consistency: Thin  Diet effective now                   Nutrition Problem: Inadequate oral intake Etiology: poor  appetite Signs/Symptoms:  meal completion < 50% Interventions: Ensure Enlive (each supplement provides 350kcal and 20 grams of protein), MVI   DVT prophylaxis: heparin injection 5,000 Units Start: 10/14/20 1400 Place TED hose Start: 10/14/20 1030 Code Status:   Code Status: Full Code  Family Communication: plan of care discussed with patient at bedside.  Status is: Inpatient  Remains inpatient appropriate because:IV treatments appropriate due to intensity of illness or inability to take PO and Inpatient level of care appropriate due to severity of illness  Dispo: The patient is from:  [place] home              Anticipated d/c is to:  :  [place] home next on next day or 2 once calcium improves              Patient currently is not medically stable to d/c.   Difficult to place patient No Objective: Vitals: Today's Vitals   10/21/20 1940 10/21/20 2332 10/22/20 0256 10/22/20 0735  BP: (!) 86/61 (!) 88/60 (!) 87/58 101/65  Pulse: 90 95 100 (!) 108  Resp: 19 20 16 15   Temp: 98.5 F (36.9 C) 99.7 F (37.6 C) 98.7 F (37.1 C) 99.3 F (37.4 C)  TempSrc: Oral Oral Oral Oral  SpO2: 100% 100% 100% 99%  Weight:      Height:      PainSc: 0-No pain 0-No pain 0-No pain    Examination: General exam: AAOx 3, thin, older than stated age, weak appearing. HEENT:Oral mucosa moist, Ear/Nose WNL grossly, dentition normal. Respiratory system: bilaterally clear, no use of accessory muscle Cardiovascular system: S1 & S2 +, No JVD,. Gastrointestinal system: Abdomen soft, urostomy in place with clear urine, NT,ND, BS+ Nervous System:Alert, awake, moving extremities and grossly nonfocal Extremities: no edema, contractures to lower extremities  Skin: No rashes,no icterus. MSK: Thin muscle bulk,tone, power    Intake/Output Summary (Last 24 hours) at 10/22/2020 0958 Last data filed at 10/22/2020 0947 Gross per 24 hour  Intake 1640 ml  Output 2325 ml  Net -685 ml    Filed Weights   10/15/20 2047 10/17/20 2059  Weight: 46.8 kg 42.7 kg   Weight change:    Consultants:see note  Procedures:see note Antimicrobials: Anti-infectives (From admission, onward)    Start     Dose/Rate Route Frequency Ordered Stop   10/18/20 1300  piperacillin-tazobactam (ZOSYN) IVPB 3.375 g        3.375 g 12.5 mL/hr over 240 Minutes Intravenous Every 8 hours 10/18/20 0533     10/18/20 0600  piperacillin-tazobactam (ZOSYN) IVPB 3.375 g        3.375 g 100 mL/hr over 30 Minutes Intravenous STAT 10/18/20 0533 10/18/20 0832   10/18/20 0500  vancomycin (VANCOCIN) IVPB 1000 mg/200 mL premix  Status:  Discontinued        1,000 mg 200 mL/hr over 60 Minutes Intravenous Every 48 hours 10/18/20 0445 10/19/20 1546   10/17/20 2200  cefTRIAXone (ROCEPHIN) 1 g in sodium chloride 0.9 % 100 mL IVPB  Status:  Discontinued        1 g 200 mL/hr over 30 Minutes Intravenous Every 24 hours 10/17/20 2149 10/18/20 0528      Culture/Microbiology    Component Value Date/Time   SDES BLOOD RIGHT ANTECUBITAL 10/18/2020 0254   SDES BLOOD RIGHT HAND 10/18/2020 0254   SPECREQUEST  10/18/2020 0254    BOTTLES DRAWN AEROBIC AND ANAEROBIC Blood Culture adequate volume   SPECREQUEST  10/18/2020 0254    BOTTLES DRAWN  AEROBIC AND ANAEROBIC Blood Culture adequate volume   CULT  10/18/2020 0254    NO GROWTH 3 DAYS Performed at [facility], [place] [address] [place], [place] [address]    CULT  10/18/2020 0254    NO GROWTH 3 DAYS Performed at [facility], [place] [address], [place], [place] [address]    REPTSTATUS PENDING 10/18/2020 0254   REPTSTATUS PENDING 10/18/2020 0254    Other culture-see note  Unresulted Labs (From admission, onward)     Start     Ordered   10/23/20 0500  Magnesium  Tomorrow morning,   R       Question:  Specimen collection method  Answer:  Lab=Lab collect   10/22/20 0743   10/21/20 0500  Comprehensive metabolic panel  Daily,   R      10/20/20 1154    10/21/20 0500  CBC  Daily,   R      10/20/20 1154   10/15/20 1124  PTH-related peptide  Once,   STAT        10/15/20 1123           Medications reviewed:  Scheduled Meds:  calcitRIOL  0.25 mcg Oral TID   calcium carbonate  1 tablet Oral BID WC   feeding supplement  237 mL Oral TID BM   heparin  5,000 Units Subcutaneous Q8H   levothyroxine  100 mcg Oral QAC breakfast   midodrine  2.5 mg Oral TID WC   multivitamin with minerals  1 tablet Oral Daily   pantoprazole  40 mg Oral Daily   potassium chloride  20 mEq Oral Daily   tiZANidine  2 mg Oral TID   vitamin B-12  500 mcg Oral Daily   Continuous Infusions:  sodium chloride 75 mL/hr at 10/21/20 2229   calcium gluconate 2,000 mg (10/22/20 0940)   magnesium sulfate bolus IVPB     piperacillin-tazobactam (ZOSYN)  IV 3.375 g (10/22/20 0555)   Intake/Output from previous day: 09/20 0701 - 09/21 0700 In: 1640 [P.O.:640; I.V.:900; IV Piggyback:100] Out: 1950 [Urine:1950] Intake/Output this shift: Total I/O In: -  Out: 500 [Urine:500] Filed Weights   10/15/20 2047 10/17/20 2059  Weight: 46.8 kg 42.7 kg   Data Reviewed: I have personally reviewed following labs and imaging studies CBC: Recent Labs  Lab 10/18/20 0254 10/19/20 0614 10/19/20 1104 10/20/20 0117 10/21/20 0145 10/22/20 0504  WBC 9.5 8.4 8.4 6.8 8.9 9.1  NEUTROABS 8.6*  --   --   --   --   --   HGB 10.9* 7.7* 8.8* 7.6* 7.0* 7.9*  HCT 33.9* 24.1* 27.3* 23.4* 22.1* 24.7*  MCV 90.9 91.6 92.5 91.8 92.1 92.5  PLT 292 248 266 267 321 094   Basic Metabolic Panel: Recent Labs  Lab 10/17/20 0354 10/18/20 0254 10/19/20 0106 10/20/20 0117 10/20/20 1005 10/21/20 0145 10/21/20 1454 10/22/20 0132  NA 145 146*   < > 141 142 139 143 143  K 4.2 4.1   < > 3.4* 3.8 3.0* 4.3 4.2  CL 113* 111   < > 109 109 109 112* 114*  CO2 20* 24   < > 19* 18* 18* 19* 16*  GLUCOSE 83 98   < > 100* 118* 104* 122* 88  BUN 22* 17   < > 16 11 13 12 13   CREATININE 1.05* 1.21*   < >  1.23* 1.20* 1.02* 0.95 0.88  CALCIUM 9.8 8.3*   < > 5.7* 5.5* 5.6* 6.0* 6.0*  MG 1.3* 2.1  --  0.9* 1.7  --  1.1* 1.0*  PHOS 1.1* 4.0  --  4.6 5.0*  --   --  5.2*   < > = values in this interval not displayed.   GFR: Estimated Creatinine Clearance: 55 mL/min (by C-G formula based on SCr of 0.88 mg/dL). Liver Function Tests: Recent Labs  Lab 10/19/20 0106 10/20/20 0117 10/20/20 1005 10/21/20 0145 10/22/20 0132  AST 110* 128* 124* 82* 54*  ALT 50* 66* 69* 61* 49*  ALKPHOS 109 153* 181* 183* 225*  BILITOT 0.5 0.3 0.6 0.1* 0.5  PROT 5.4* 5.5* 5.9* 5.4* 5.4*  ALBUMIN 2.2* 2.1* 2.3* 2.1* 2.2*   No results for input(s): LIPASE, AMYLASE in the last 168 hours. No results for input(s): AMMONIA in the last 168 hours. Coagulation Profile: No results for input(s): INR, PROTIME in the last 168 hours. Cardiac Enzymes: No results for input(s): CKTOTAL, CKMB, CKMBINDEX, TROPONINI in the last 168 hours. BNP (last 3 results) No results for input(s): PROBNP in the last 8760 hours. HbA1C: No results for input(s): HGBA1C in the last 72 hours. CBG: No results for input(s): GLUCAP in the last 168 hours. Lipid Profile: No results for input(s): CHOL, HDL, LDLCALC, TRIG, CHOLHDL, LDLDIRECT in the last 72 hours. Thyroid Function Tests: No results for input(s): TSH, T4TOTAL, FREET4, T3FREE, THYROIDAB in the last 72 hours. Anemia Panel: No results for input(s): VITAMINB12, FOLATE, FERRITIN, TIBC, IRON, RETICCTPCT in the last 72 hours. Sepsis Labs: Recent Labs  Lab 10/18/20 8563  LATICACIDVEN 0.6    Recent Results (from the past 240 hour(s))  Resp Panel by RT-PCR (Flu A&B, Covid) Nasopharyngeal Swab     Status: None   Collection Time: 10/13/20  8:38 PM   Specimen: Nasopharyngeal Swab; Nasopharyngeal(NP) swabs in vial transport medium  Result Value Ref Range Status   SARS Coronavirus 2 by RT PCR NEGATIVE NEGATIVE Final    Comment: (NOTE) SARS-CoV-2 target nucleic acids are NOT DETECTED.  The  SARS-CoV-2 RNA is generally detectable in upper respiratory specimens during the acute phase of infection. The lowest concentration of SARS-CoV-2 viral copies this assay can detect is 138 copies/mL. A negative result does not preclude SARS-Cov-2 infection and should not be used as the sole basis for treatment or other patient management decisions. A negative result may occur with  improper specimen collection/handling, submission of specimen other than nasopharyngeal swab, presence of viral mutation(s) within the areas targeted by this assay, and inadequate number of viral copies(<138 copies/mL). A negative result must be combined with clinical observations, patient history, and epidemiological information. The expected result is Negative.  Fact Sheet for Patients:  EntrepreneurPulse.com.au  Fact Sheet for Healthcare Providers:  IncredibleEmployment.be  This test is no t yet approved or cleared by the [place] FDA and  has been authorized for detection and/or diagnosis of SARS-CoV-2 by FDA under an Emergency Use Authorization (EUA). This EUA will remain  in effect (meaning this test can be used) for the duration of the COVID-19 declaration under Section 564(b)(1) of the Act, 21 U.S.C.section 360bbb-3(b)(1), unless the authorization is terminated  or revoked sooner.       Influenza A by PCR NEGATIVE NEGATIVE Final   Influenza B by PCR NEGATIVE NEGATIVE Final    Comment: (NOTE) The Xpert Xpress SARS-CoV-2/FLU/RSV plus assay is intended as an aid in the diagnosis of influenza from Nasopharyngeal swab specimens and should not be used as a sole basis for treatment. Nasal washings and aspirates are unacceptable  for Xpert Xpress SARS-CoV-2/FLU/RSV testing.  Fact Sheet for Patients: EntrepreneurPulse.com.au  Fact Sheet for Healthcare Providers: IncredibleEmployment.be  This test is not yet approved or  cleared by the [place] FDA and has been authorized for detection and/or diagnosis of SARS-CoV-2 by FDA under an Emergency Use Authorization (EUA). This EUA will remain in effect (meaning this test can be used) for the duration of the COVID-19 declaration under Section 564(b)(1) of the Act, 21 U.S.C. section 360bbb-3(b)(1), unless the authorization is terminated or revoked.  Performed at [facility], [place] [address] [place], [place] [address]   SARS CORONAVIRUS 2 (TAT 6-24 HRS) Nasopharyngeal Nasopharyngeal Swab     Status: None   Collection Time: 10/17/20  5:17 PM   Specimen: Nasopharyngeal Swab  Result Value Ref Range Status   SARS Coronavirus 2 NEGATIVE NEGATIVE Final    Comment: (NOTE) SARS-CoV-2 target nucleic acids are NOT DETECTED.  The SARS-CoV-2 RNA is generally detectable in upper and lower respiratory specimens during the acute phase of infection. Negative results do not preclude SARS-CoV-2 infection, do not rule out co-infections with other pathogens, and should not be used as the sole basis for treatment or other patient management decisions. Negative results must be combined with clinical observations, patient history, and epidemiological information. The expected result is Negative.  Fact Sheet for Patients: SugarRoll.be  Fact Sheet for Healthcare Providers: https://www.woods-mathews.com/  This test is not yet approved or cleared by the [place] FDA and  has been authorized for detection and/or diagnosis of SARS-CoV-2 by FDA under an Emergency Use Authorization (EUA). This EUA will remain  in effect (meaning this test can be used) for the duration of the COVID-19 declaration under Se ction 564(b)(1) of the Act, 21 U.S.C. section 360bbb-3(b)(1), unless the authorization is terminated or revoked sooner.  Performed at [facility], [place] [address] [place], [place] [address]   Culture, blood  (routine x 2)     Status: None (Preliminary result)   Collection Time: 10/18/20  2:54 AM   Specimen: BLOOD  Result Value Ref Range Status   Specimen Description BLOOD RIGHT ANTECUBITAL  Final   Special Requests   Final    BOTTLES DRAWN AEROBIC AND ANAEROBIC Blood Culture adequate volume   Culture   Final    NO GROWTH 3 DAYS Performed at [facility], [place] [address], [place], [place] [address]    Report Status PENDING  Incomplete  Culture, blood (routine x 2)     Status: None (Preliminary result)   Collection Time: 10/18/20  2:54 AM   Specimen: BLOOD RIGHT HAND  Result Value Ref Range Status   Specimen Description BLOOD RIGHT HAND  Final   Special Requests   Final    BOTTLES DRAWN AEROBIC AND ANAEROBIC Blood Culture adequate volume   Culture   Final    NO GROWTH 3 DAYS Performed at [facility], [place] [address], [place], [place] [address]    Report Status PENDING  Incomplete  Resp Panel by RT-PCR (Flu A&B, Covid) Nasopharyngeal Swab     Status: None   Collection Time: 10/18/20  9:20 AM   Specimen: Nasopharyngeal Swab; Nasopharyngeal(NP) swabs in vial transport medium  Result Value Ref Range Status   SARS Coronavirus 2 by RT PCR NEGATIVE NEGATIVE Final    Comment: (NOTE) SARS-CoV-2 target nucleic acids are NOT DETECTED.  The SARS-CoV-2 RNA is generally detectable in upper respiratory specimens during the acute phase of infection. The lowest concentration  of SARS-CoV-2 viral copies this assay can detect is 138 copies/mL. A negative result does not preclude SARS-Cov-2 infection and should not be used as the sole basis for treatment or other patient management decisions. A negative result may occur with  improper specimen collection/handling, submission of specimen other than nasopharyngeal swab, presence of viral mutation(s) within the areas targeted by this assay, and inadequate number of viral copies(<138 copies/mL). A negative result must be combined with clinical  observations, patient history, and epidemiological information. The expected result is Negative.  Fact Sheet for Patients:  EntrepreneurPulse.com.au  Fact Sheet for Healthcare Providers:  IncredibleEmployment.be  This test is no t yet approved or cleared by the [place] FDA and  has been authorized for detection and/or diagnosis of SARS-CoV-2 by FDA under an Emergency Use Authorization (EUA). This EUA will remain  in effect (meaning this test can be used) for the duration of the COVID-19 declaration under Section 564(b)(1) of the Act, 21 U.S.C.section 360bbb-3(b)(1), unless the authorization is terminated  or revoked sooner.       Influenza A by PCR NEGATIVE NEGATIVE Final   Influenza B by PCR NEGATIVE NEGATIVE Final    Comment: (NOTE) The Xpert Xpress SARS-CoV-2/FLU/RSV plus assay is intended as an aid in the diagnosis of influenza from Nasopharyngeal swab specimens and should not be used as a sole basis for treatment. Nasal washings and aspirates are unacceptable for Xpert Xpress SARS-CoV-2/FLU/RSV testing.  Fact Sheet for Patients: EntrepreneurPulse.com.au  Fact Sheet for Healthcare Providers: IncredibleEmployment.be  This test is not yet approved or cleared by the [place] FDA and has been authorized for detection and/or diagnosis of SARS-CoV-2 by FDA under an Emergency Use Authorization (EUA). This EUA will remain in effect (meaning this test can be used) for the duration of the COVID-19 declaration under Section 564(b)(1) of the Act, 21 U.S.C. section 360bbb-3(b)(1), unless the authorization is terminated or revoked.  Performed at [facility], [place] [address], [place], [place] [address]      Radiology Studies: No results found.   LOS: 8 days   Antonieta Pert, MD [facility]  10/22/2020, 9:58 AM

## 2020-10-22 NOTE — Progress Notes (Signed)
Critical lab Calcium 6.0 Text page Dr. Hal Hope results.

## 2020-10-23 LAB — IRON AND TIBC
Iron: 81 ug/dL (ref 28–170)
Saturation Ratios: 39 % — ABNORMAL HIGH (ref 10.4–31.8)
TIBC: 207 ug/dL — ABNORMAL LOW (ref 250–450)
UIBC: 126 ug/dL

## 2020-10-23 LAB — COMPREHENSIVE METABOLIC PANEL
ALT: 40 U/L (ref 0–44)
AST: 41 U/L (ref 15–41)
Albumin: 2.1 g/dL — ABNORMAL LOW (ref 3.5–5.0)
Alkaline Phosphatase: 275 U/L — ABNORMAL HIGH (ref 38–126)
Anion gap: 9 (ref 5–15)
BUN: 10 mg/dL (ref 6–20)
CO2: 21 mmol/L — ABNORMAL LOW (ref 22–32)
Calcium: 6.4 mg/dL — CL (ref 8.9–10.3)
Chloride: 109 mmol/L (ref 98–111)
Creatinine, Ser: 0.95 mg/dL (ref 0.44–1.00)
GFR, Estimated: >60 mL/min (ref >60)
Glucose, Bld: 114 mg/dL — ABNORMAL HIGH (ref 70–99)
Potassium: 4.2 mmol/L (ref 3.5–5.1)
Sodium: 139 mmol/L (ref 135–145)
Total Bilirubin: 0.3 mg/dL (ref 0.3–1.2)
Total Protein: 5.7 g/dL — ABNORMAL LOW (ref 6.5–8.1)

## 2020-10-23 LAB — PREPARE RBC (CROSSMATCH)

## 2020-10-23 LAB — RETICULOCYTES
Immature Retic Fract: 10.3 % (ref 2.3–15.9)
RBC.: 2.87 MIL/uL — ABNORMAL LOW (ref 3.87–5.11)
Retic Count, Absolute: 28.1 10*3/uL (ref 19.0–186.0)
Retic Ct Pct: 1 % (ref 0.4–3.1)

## 2020-10-23 LAB — CBC
HCT: 21.1 % — ABNORMAL LOW (ref 36.0–46.0)
Hemoglobin: 6.9 g/dL — CL (ref 12.0–15.0)
MCH: 29.9 pg (ref 26.0–34.0)
MCHC: 32.7 g/dL (ref 30.0–36.0)
MCV: 91.3 fL (ref 80.0–100.0)
Platelets: 381 10*3/uL (ref 150–400)
RBC: 2.31 MIL/uL — ABNORMAL LOW (ref 3.87–5.11)
RDW: 15.3 % (ref 11.5–15.5)
WBC: 7.4 10*3/uL (ref 4.0–10.5)
nRBC: 0 % (ref 0.0–0.2)

## 2020-10-23 LAB — CULTURE, BLOOD (ROUTINE X 2)
Culture: NO GROWTH
Culture: NO GROWTH
Special Requests: ADEQUATE
Special Requests: ADEQUATE

## 2020-10-23 LAB — VITAMIN B12: Vitamin B-12: 1332 pg/mL — ABNORMAL HIGH (ref 180–914)

## 2020-10-23 LAB — OCCULT BLOOD X 1 CARD TO LAB, STOOL: Fecal Occult Bld: POSITIVE — AB

## 2020-10-23 LAB — MAGNESIUM: Magnesium: 1.8 mg/dL (ref 1.7–2.4)

## 2020-10-23 LAB — FERRITIN: Ferritin: 160 ng/mL (ref 11–307)

## 2020-10-23 LAB — FOLATE: Folate: 7.6 ng/mL (ref >5.9)

## 2020-10-23 MED ORDER — SODIUM CHLORIDE 0.9% IV SOLUTION: Freq: Once | INTRAVENOUS | Status: DC

## 2020-10-23 MED ORDER — MIDODRINE HCL 5 MG PO TABS
2.5000 mg | ORAL_TABLET | ORAL | Status: AC
  Administered 2020-10-23: 2.5 mg via ORAL

## 2020-10-23 MED ORDER — CALCIUM GLUCONATE-NACL 1-0.675 GM/50ML-% IV SOLN
1.0000 g | Freq: Once | INTRAVENOUS | Status: AC
  Administered 2020-10-23: 1000 mg via INTRAVENOUS
  Filled 2020-10-23: qty 50

## 2020-10-23 NOTE — TOC Progression Note (Signed)
Transition of Care [facility]) - Progression Note    Patient Details  Name: Deanna Baldwin MRN: [id number] Date of Birth: [birth year]  Transition of Care [facility]) CM/SW [place],  Phone Number: 10/23/2020, 4:56 PM  Clinical Narrative:     CSW spoke with patient's sister, carolyn, per her request. She express concerns regarding patient returning to SNF. She states she has been at her current placement for 5 years. She believes the patient is not getting the care she needs.  CSW acknowledged her concerns and CSW explained the SNF process. CSW informed if unable to secure another LTC placement , she will need to request transfer and seek assistance from current SNF. She states understanding.  CSW will provide bed offer if available.  CSW will continue to follow and assist with discharge planning.  Thurmond Butts, MSW, LCSW Clinical Social Worker    Expected Discharge Plan: [place] [facility]) Barriers to Discharge: Continued Medical Work up  Expected Discharge Plan and Services Expected Discharge Plan: [place] [facility]) In-house Referral: Clinical Social Work     Living arrangements for the past 2 months: [place] ([employer])                                       Social Determinants of Health ([place]) Interventions    Readmission Risk Interventions No flowsheet data found.

## 2020-10-23 NOTE — Progress Notes (Signed)
+ PROGRESS NOTE    Deanna Baldwin  YTK:[id number] DOB: Sep 03, 1975 DOA: 10/13/2020 PCP: [name], MD   Chief Complaint  Patient presents with   Medical Evaluation   Abnormal Lab    Brief Narrative/Hospital Course: 58 old female with history of hypothyroidism, MS with contractures of her legs neuromuscular dysfunctions of bladder, status post urostomy, multiple pressure ulcers, with ambulatory dysfunction/sedentary, history of hypotension on midodrine, hypothyroidism from [place] sent to the ED for electrolyte abnormality Apparently patient was getting IV fluids  and also had change in mental status at the facility.  In the ED had leukocytosis severely elevated calcium level creatinine 3.0 EKG NSR, nephro was consulted and patient was admitted.  Managed with aggressive IV hydration calcitonin serial calcium monitoring.  PTH was appropriately suppressed, vitamin D urine calcium nl. Managed with calcitonin x48 hours, aggressive fluid and bisphosphonates x1 with resolution of hypercalcemia. Patient had fever spikes tachycardia soft blood pressure transfer to stepdown after placing on IV antibiotics- vancomycin Zosyn- underwent CT chest abdomen pelvis-no clear source.  Blood cultures were sent and negative so far-off vancomycin 9/20. Patient having hypocalcemia and being replaced   Subjective: Overnight afebrile.   Calcium slowly coming up 6.4, hemoglobin was 6.9 g 1 unit PRBC ordered  and pending Blood pressure soft at times in 80s and HR in low 100s- chronic for her. Complains of feeling weak but otherwise feels "pretty good" denies cough fever chest pain.  Just had a bowel movement  Assessment & Plan:  FUO on 9/16: No further recurrence.  No clear source of infection.Underwent CT chest abdomen pelvis unyielding, blood culture no growth so far.  Discontinue antibiotics and monitor.   Severe hypercalcemia likely from volume contraction.  Status post calcitonin and  bisphosphonates 9/15.  PTH suppressed urine calcium 25 hydroxy vitamin D normal.  Now having hyocalcemia. See below.  Hypocalcemia suspecting overcorrection/2/2 bisphosphonates--discussed w/ Dr. Justin Mend- calcitriol 0.25x3 doses 9/21 and also received IV calcium gluconate 4 g.  Corrected calcium 7.9.repleted magnesium.  Continue oral calcium.    Hypomagnesemia  mag better w/ iv- add magnesium oxide  Hypophosphatemia-resolved  AKI Mild metabolic acidosis  Hypernatremia Creat better, bicarb 21 and sodium improved. Cut vbakc on ivf- on gelte ivf to catch up with urostomy loss  Transaminitis: AST ALT has normalized ALP slightly up monitor  Leukocytosis resolved  Chronic hypotension on midodrine at home dose 2.5. give 2.5 mg extra this am Sinus tachycardia: Asymptomatic.  Likely with ongoing urostomy output and also anemia.  Transfusing hemoglobin.Net IO Since Admission: -8,047.47 mL [10/23/20 1046]  net negative but not sure if completely accurate.  Hypothyroidism continue Synthroid check TSH in 4-week  Moderate bilateral hydronephrosis/9 mm right lower pole calculus with dysfunctional bladder urostomy in place outpatient follow-up  Anemia of chronic disease with drop in hemoglobin likely from hemodilution.  Hemoglobin likely hemoconcentrated on admission in the setting of AKI.  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  Pressure ulcer left lateral foot unstageable, and on left hip stage II.  POA. Cont air matrress, cont wound care  Diet Order             Diet regular Room service  appropriate? Yes with Assist; Fluid  consistency: Thin  Diet effective now                   Nutrition Problem: Inadequate oral intake Etiology: poor appetite Signs/Symptoms: meal completion < 50% Interventions: Ensure Enlive (each supplement provides 350kcal and 20 grams of protein), MVI   DVT prophylaxis: heparin injection 5,000 Units Start: 10/14/20 1400 Place TED hose Start: 10/14/20 1030 Code Status:   Code Status: Full Code  Family Communication: plan of care discussed with patient at bedside. I discussed with patient's sister over the phone and updated.  She is wondering about placement to a new place and she is coming at 82 tomorrow to discuss w/ Education officer, museum. Consult placed in  Status is: Inpatient  Remains inpatient appropriate because:IV treatments appropriate due to intensity of illness or inability to take PO and Inpatient level of care appropriate due to severity of illness  Dispo: The patient is from:  [facility]              Anticipated d/c is to: Back to facility once hemoglobin and electrolytes stabilized likely next 1 day              Patient currently is not medically stable to d/c.   Difficult to place patient No Objective: Vitals: Today's Vitals   10/23/20 0803 10/23/20 0821 10/23/20 0913 10/23/20 1015  BP: 101/72   (!) 83/50  Pulse: 100   (!) 103  Resp: 15   (!) 27  Temp: 99 F (37.2 C)   99.8 F (37.7 C)  TempSrc: Oral   Oral  SpO2: 96%   95%  Weight:      Height:      PainSc:  1  1     Examination: General exam: AAOx 3, thin, pleasant HEENT:Oral mucosa moist, Ear/Nose WNL grossly, dentition normal. Respiratory system: bilaterally clear, no use of accessory muscle Cardiovascular system: S1 & S2 +, No JVD,. Gastrointestinal system: Abdomen soft,urostomy in empty NT,ND, BS+ Nervous System:Alert, awake, moving  UE well, contracture on LE Extremities: no edema, distal peripheral pulses palpable.  Skin: No rashes,no icterus. MSK: thin muscle bulk,tone, power     Intake/Output Summary (Last 24 hours) at 10/23/2020 1046 Last data filed at 10/23/2020 0847 Gross per 24 hour  Intake 720 ml  Output 2025 ml  Net -1305 ml   Filed Weights   10/15/20 2047 10/17/20 2059 10/23/20 0319  Weight: 46.8 kg 42.7 kg 44 kg   Weight change:    Consultants:see note  Procedures:see note Antimicrobials: Anti-infectives (From admission, onward)    Start     Dose/Rate Route Frequency Ordered Stop   10/18/20 1300  piperacillin-tazobactam (ZOSYN) IVPB 3.375 g  Status:  Discontinued        3.375 g 12.5 mL/hr over 240 Minutes Intravenous Every 8 hours 10/18/20 0533 10/23/20 0957   10/18/20 0600  piperacillin-tazobactam (ZOSYN) IVPB 3.375 g        3.375 g 100 mL/hr over 30 Minutes Intravenous STAT 10/18/20 0533 10/18/20 0832   10/18/20 0500  vancomycin (VANCOCIN) IVPB 1000 mg/200 mL premix  Status:  Discontinued        1,000 mg 200 mL/hr over 60 Minutes Intravenous Every 48 hours 10/18/20 0445 10/19/20 1546   10/17/20 2200  cefTRIAXone (ROCEPHIN) 1 g in sodium chloride 0.9 % 100 mL IVPB  Status:  Discontinued        1 g 200 mL/hr over 30 Minutes Intravenous Every  24 hours 10/17/20 2149 10/18/20 0528      Culture/Microbiology    Component Value Date/Time   SDES BLOOD RIGHT ANTECUBITAL 10/18/2020 0254   SDES BLOOD RIGHT HAND 10/18/2020 0254   SPECREQUEST  10/18/2020 0254    BOTTLES DRAWN AEROBIC AND ANAEROBIC Blood Culture adequate volume   SPECREQUEST  10/18/2020 0254    BOTTLES DRAWN AEROBIC AND ANAEROBIC Blood Culture adequate volume   CULT  10/18/2020 0254    NO GROWTH 4 DAYS Performed at [facility], [place] [address] [place], [place] [address]    CULT  10/18/2020 0254    NO GROWTH 4 DAYS Performed at [facility], [place] [address], [place], [place] [address]    REPTSTATUS PENDING 10/18/2020 0254   REPTSTATUS PENDING 10/18/2020 0254    Other culture-see note  Unresulted Labs (From admission, onward)     Start     Ordered    10/15/20 1124  PTH-related peptide  Once,   STAT        10/15/20 1123           Medications reviewed:  Scheduled Meds:  sodium chloride   Intravenous Once   calcium carbonate  1 tablet Oral BID WC   feeding supplement  237 mL Oral TID BM   heparin  5,000 Units Subcutaneous Q8H   levothyroxine  100 mcg Oral QAC breakfast   midodrine  2.5 mg Oral TID WC   midodrine  2.5 mg Oral STAT   multivitamin with minerals  1 tablet Oral Daily   pantoprazole  40 mg Oral Daily   potassium chloride  20 mEq Oral Daily   tiZANidine  2 mg Oral TID   vitamin B-12  500 mcg Oral Daily   Continuous Infusions:  sodium chloride 100 mL/hr at 10/23/20 0028   sodium chloride Stopped (10/22/20 1735)   Intake/Output from previous day: 09/21 0701 - 09/22 0700 In: 840 [P.O.:840] Out: 2425 [Urine:2425] Intake/Output this shift: Total I/O In: -  Out: 100 [Urine:100] Filed Weights   10/15/20 2047 10/17/20 2059 10/23/20 0319  Weight: 46.8 kg 42.7 kg 44 kg   Data Reviewed: I have personally reviewed following labs and imaging studies CBC: Recent Labs  Lab 10/18/20 0254 10/19/20 0614 10/19/20 1104 10/20/20 0117 10/21/20 0145 10/22/20 0504 10/23/20 0131  WBC 9.5   < > 8.4 6.8 8.9 9.1 7.4  NEUTROABS 8.6*  --   --   --   --   --   --   HGB 10.9*   < > 8.8* 7.6* 7.0* 7.9* 6.9*  HCT 33.9*   < > 27.3* 23.4* 22.1* 24.7* 21.1*  MCV 90.9   < > 92.5 91.8 92.1 92.5 91.3  PLT 292   < > 266 267 321 399 381   < > = values in this interval not displayed.   Basic Metabolic Panel: Recent Labs  Lab 10/17/20 0354 10/18/20 0254 10/19/20 0106 10/20/20 0117 10/20/20 1005 10/21/20 0145 10/21/20 1454 10/22/20 0132 10/23/20 0131  NA 145 146*   < > 141 142 139 143 143 139  K 4.2 4.1   < > 3.4* 3.8 3.0* 4.3 4.2 4.2  CL 113* 111   < > 109 109 109 112* 114* 109  CO2 20* 24   < > 19* 18* 18* 19* 16* 21*  GLUCOSE 83 98   < > 100* 118* 104* 122* 88 114*  BUN 22* 17   < > 16 11 13 12 13  10  CREATININE 1.05*  1.21*   < > 1.23* 1.20* 1.02* 0.95 0.88 0.95  CALCIUM 9.8 8.3*   < > 5.7* 5.5* 5.6* 6.0* 6.0* 6.4*  MG 1.3* 2.1  --  0.9* 1.7  --  1.1* 1.0* 1.8  PHOS 1.1* 4.0  --  4.6 5.0*  --   --  5.2*  --    < > = values in this interval not displayed.   GFR: Estimated Creatinine Clearance: 51.5 mL/min (by C-G formula based on SCr of 0.95 mg/dL). Liver Function Tests: Recent Labs  Lab 10/20/20 0117 10/20/20 1005 10/21/20 0145 10/22/20 0132 10/23/20 0131  AST 128* 124* 82* 54* 41  ALT 66* 69* 61* 49* 40  ALKPHOS 153* 181* 183* 225* 275*  BILITOT 0.3 0.6 0.1* 0.5 0.3  PROT 5.5* 5.9* 5.4* 5.4* 5.7*  ALBUMIN 2.1* 2.3* 2.1* 2.2* 2.1*   No results for input(s): LIPASE, AMYLASE in the last 168 hours. No results for input(s): AMMONIA in the last 168 hours. Coagulation Profile: No results for input(s): INR, PROTIME in the last 168 hours. Cardiac Enzymes: No results for input(s): CKTOTAL, CKMB, CKMBINDEX, TROPONINI in the last 168 hours. BNP (last 3 results) No results for input(s): PROBNP in the last 8760 hours. HbA1C: No results for input(s): HGBA1C in the last 72 hours. CBG: No results for input(s): GLUCAP in the last 168 hours. Lipid Profile: No results for input(s): CHOL, HDL, LDLCALC, TRIG, CHOLHDL, LDLDIRECT in the last 72 hours. Thyroid Function Tests: No results for input(s): TSH, T4TOTAL, FREET4, T3FREE, THYROIDAB in the last 72 hours. Anemia Panel: No results for input(s): VITAMINB12, FOLATE, FERRITIN, TIBC, IRON, RETICCTPCT in the last 72 hours. Sepsis Labs: Recent Labs  Lab 10/18/20 1610  LATICACIDVEN 0.6    Recent Results (from the past 240 hour(s))  Resp Panel by RT-PCR (Flu A&B, Covid) Nasopharyngeal Swab     Status: None   Collection Time: 10/13/20  8:38 PM   Specimen: Nasopharyngeal Swab; Nasopharyngeal(NP) swabs in vial transport medium  Result Value Ref Range Status   SARS Coronavirus 2 by RT PCR NEGATIVE NEGATIVE Final    Comment: (NOTE) SARS-CoV-2 target nucleic  acids are NOT DETECTED.  The SARS-CoV-2 RNA is generally detectable in upper respiratory specimens during the acute phase of infection. The lowest concentration of SARS-CoV-2 viral copies this assay can detect is 138 copies/mL. A negative result does not preclude SARS-Cov-2 infection and should not be used as the sole basis for treatment or other patient management decisions. A negative result may occur with  improper specimen collection/handling, submission of specimen other than nasopharyngeal swab, presence of viral mutation(s) within the areas targeted by this assay, and inadequate number of viral copies(<138 copies/mL). A negative result must be combined with clinical observations, patient history, and epidemiological information. The expected result is Negative.  Fact Sheet for Patients:  EntrepreneurPulse.com.au  Fact Sheet for Healthcare Providers:  IncredibleEmployment.be  This test is no t yet approved or cleared by the [place] FDA and  has been authorized for detection and/or diagnosis of SARS-CoV-2 by FDA under an Emergency Use Authorization (EUA). This EUA will remain  in effect (meaning this test can be used) for the duration of the COVID-19 declaration under Section 564(b)(1) of the Act, 21 U.S.C.section 360bbb-3(b)(1), unless the authorization is terminated  or revoked sooner.       Influenza A by PCR NEGATIVE NEGATIVE Final   Influenza B by PCR NEGATIVE NEGATIVE Final    Comment: (NOTE) The Xpert Xpress SARS-CoV-2/FLU/RSV  plus assay is intended as an aid in the diagnosis of influenza from Nasopharyngeal swab specimens and should not be used as a sole basis for treatment. Nasal washings and aspirates are unacceptable for Xpert Xpress SARS-CoV-2/FLU/RSV testing.  Fact Sheet for Patients: EntrepreneurPulse.com.au  Fact Sheet for Healthcare Providers: IncredibleEmployment.be  This  test is not yet approved or cleared by the [place] FDA and has been authorized for detection and/or diagnosis of SARS-CoV-2 by FDA under an Emergency Use Authorization (EUA). This EUA will remain in effect (meaning this test can be used) for the duration of the COVID-19 declaration under Section 564(b)(1) of the Act, 21 U.S.C. section 360bbb-3(b)(1), unless the authorization is terminated or revoked.  Performed at [facility], [place] [address] [place], [place] [address]   SARS CORONAVIRUS 2 (TAT 6-24 HRS) Nasopharyngeal Nasopharyngeal Swab     Status: None   Collection Time: 10/17/20  5:17 PM   Specimen: Nasopharyngeal Swab  Result Value Ref Range Status   SARS Coronavirus 2 NEGATIVE NEGATIVE Final    Comment: (NOTE) SARS-CoV-2 target nucleic acids are NOT DETECTED.  The SARS-CoV-2 RNA is generally detectable in upper and lower respiratory specimens during the acute phase of infection. Negative results do not preclude SARS-CoV-2 infection, do not rule out co-infections with other pathogens, and should not be used as the sole basis for treatment or other patient management decisions. Negative results must be combined with clinical observations, patient history, and epidemiological information. The expected result is Negative.  Fact Sheet for Patients: SugarRoll.be  Fact Sheet for Healthcare Providers: https://www.woods-mathews.com/  This test is not yet approved or cleared by the [place] FDA and  has been authorized for detection and/or diagnosis of SARS-CoV-2 by FDA under an Emergency Use Authorization (EUA). This EUA will remain  in effect (meaning this test can be used) for the duration of the COVID-19 declaration under Se ction 564(b)(1) of the Act, 21 U.S.C. section 360bbb-3(b)(1), unless the authorization is terminated or revoked sooner.  Performed at [facility], [place] [address], [place],  [place] [address]   Culture, blood (routine x 2)     Status: None (Preliminary result)   Collection Time: 10/18/20  2:54 AM   Specimen: BLOOD  Result Value Ref Range Status   Specimen Description BLOOD RIGHT ANTECUBITAL  Final   Special Requests   Final    BOTTLES DRAWN AEROBIC AND ANAEROBIC Blood Culture adequate volume   Culture   Final    NO GROWTH 4 DAYS Performed at [facility], [place] [address], [place], [place] [address]    Report Status PENDING  Incomplete  Culture, blood (routine x 2)     Status: None (Preliminary result)   Collection Time: 10/18/20  2:54 AM   Specimen: BLOOD RIGHT HAND  Result Value Ref Range Status   Specimen Description BLOOD RIGHT HAND  Final   Special Requests   Final    BOTTLES DRAWN AEROBIC AND ANAEROBIC Blood Culture adequate volume   Culture   Final    NO GROWTH 4 DAYS Performed at [facility], [place] [address], [place], [place] [address]    Report Status PENDING  Incomplete  Resp Panel by RT-PCR (Flu A&B, Covid) Nasopharyngeal Swab     Status: None   Collection Time: 10/18/20  9:20 AM   Specimen: Nasopharyngeal Swab; Nasopharyngeal(NP) swabs in vial transport medium  Result Value Ref Range Status   SARS Coronavirus 2 by RT PCR NEGATIVE NEGATIVE  Final    Comment: (NOTE) SARS-CoV-2 target nucleic acids are NOT DETECTED.  The SARS-CoV-2 RNA is generally detectable in upper respiratory specimens during the acute phase of infection. The lowest concentration of SARS-CoV-2 viral copies this assay can detect is 138 copies/mL. A negative result does not preclude SARS-Cov-2 infection and should not be used as the sole basis for treatment or other patient management decisions. A negative result may occur with  improper specimen collection/handling, submission of specimen other than nasopharyngeal swab, presence of viral mutation(s) within the areas targeted by this assay, and inadequate number of viral copies(<138 copies/mL). A negative result  must be combined with clinical observations, patient history, and epidemiological information. The expected result is Negative.  Fact Sheet for Patients:  EntrepreneurPulse.com.au  Fact Sheet for Healthcare Providers:  IncredibleEmployment.be  This test is no t yet approved or cleared by the [place] FDA and  has been authorized for detection and/or diagnosis of SARS-CoV-2 by FDA under an Emergency Use Authorization (EUA). This EUA will remain  in effect (meaning this test can be used) for the duration of the COVID-19 declaration under Section 564(b)(1) of the Act, 21 U.S.C.section 360bbb-3(b)(1), unless the authorization is terminated  or revoked sooner.       Influenza A by PCR NEGATIVE NEGATIVE Final   Influenza B by PCR NEGATIVE NEGATIVE Final    Comment: (NOTE) The Xpert Xpress SARS-CoV-2/FLU/RSV plus assay is intended as an aid in the diagnosis of influenza from Nasopharyngeal swab specimens and should not be used as a sole basis for treatment. Nasal washings and aspirates are unacceptable for Xpert Xpress SARS-CoV-2/FLU/RSV testing.  Fact Sheet for Patients: EntrepreneurPulse.com.au  Fact Sheet for Healthcare Providers: IncredibleEmployment.be  This test is not yet approved or cleared by the [place] FDA and has been authorized for detection and/or diagnosis of SARS-CoV-2 by FDA under an Emergency Use Authorization (EUA). This EUA will remain in effect (meaning this test can be used) for the duration of the COVID-19 declaration under Section 564(b)(1) of the Act, 21 U.S.C. section 360bbb-3(b)(1), unless the authorization is terminated or revoked.  Performed at [facility], [place] [address], [place], [place] [address]      Radiology Studies: No results found.   LOS: 9 days   Antonieta Pert, MD [facility]  10/23/2020, 10:46 AM

## 2020-10-23 NOTE — Progress Notes (Signed)
Text paged Dr. Cyd Silence HBG 6.9 ,HCT 21.1. Butch Penny R.N. is aware

## 2020-10-23 NOTE — Progress Notes (Signed)
Left message on answering machine for Deanna Baldwin patient sister to talk to her about Deanna Baldwin receiving blood today. Patient states that Deanna Baldwin is her P.O.A. awaiting return call.

## 2020-10-23 NOTE — Progress Notes (Signed)
HOSPITAL MEDICINE OVERNIGHT EVENT NOTE    Notified by nursing that patient is hypocalcemic this morning at 6.4.  Considering substantial hypoalbuminemia when corrected number is likely closer to 8.0.  Will provide patient with 1 g of intravenous calcium gluconate.  It seems that calcium levels are slowly trending upward over the past several days with repeated correction.  Also notified by nursing the patient's hemoglobin this morning is 6.9.  Patient denies chest pain or shortness of breath.  Patient is not hypoxic or hemodynamically unstable.  Patient is not exhibiting any evidence of bleeding.  Likely anemia of chronic disease as mentioned in the day provider notes.  Will order type and screen followed by 1 unit of packed red blood cell transfusion with posttransfusion H/H.  Vernelle Emerald  MD [facility]

## 2020-10-23 NOTE — Consult Note (Signed)
WOC consult placed for supplies for the urostomy pouching system. These supplies with Kellie Simmering numbers are in the orders placed prior on 10/14/20  Jocelyn Lamer L. Tamala Julian, MSN, RN, [place], [place] [place], [facility] Wound Treatment Associate Pager [phone]

## 2020-10-23 NOTE — Progress Notes (Signed)
Text page Dr. Cyd Silence Calcium level 6.4

## 2020-10-24 DIAGNOSIS — E860 Dehydration: Secondary | ICD-10-CM | POA: Diagnosis not present

## 2020-10-24 DIAGNOSIS — R195 Other fecal abnormalities: Secondary | ICD-10-CM

## 2020-10-24 DIAGNOSIS — R710 Precipitous drop in hematocrit: Secondary | ICD-10-CM

## 2020-10-24 LAB — COMPREHENSIVE METABOLIC PANEL
ALT: 28 U/L (ref 0–44)
AST: 17 U/L (ref 15–41)
Albumin: 2.2 g/dL — ABNORMAL LOW (ref 3.5–5.0)
Alkaline Phosphatase: 237 U/L — ABNORMAL HIGH (ref 38–126)
Anion gap: 10 (ref 5–15)
BUN: 15 mg/dL (ref 6–20)
CO2: 21 mmol/L — ABNORMAL LOW (ref 22–32)
Calcium: 7.6 mg/dL — ABNORMAL LOW (ref 8.9–10.3)
Chloride: 110 mmol/L (ref 98–111)
Creatinine, Ser: 0.71 mg/dL (ref 0.44–1.00)
GFR, Estimated: >60 mL/min (ref >60)
Glucose, Bld: 87 mg/dL (ref 70–99)
Potassium: 3.8 mmol/L (ref 3.5–5.1)
Sodium: 141 mmol/L (ref 135–145)
Total Bilirubin: 0.3 mg/dL (ref 0.3–1.2)
Total Protein: 5.9 g/dL — ABNORMAL LOW (ref 6.5–8.1)

## 2020-10-24 LAB — HEMOGLOBIN AND HEMATOCRIT, BLOOD
HCT: 26.3 % — ABNORMAL LOW (ref 36.0–46.0)
Hemoglobin: 8.9 g/dL — ABNORMAL LOW (ref 12.0–15.0)

## 2020-10-24 LAB — TYPE AND SCREEN
ABO/RH(D): AB POS
Antibody Screen: NEGATIVE
Unit division: 0

## 2020-10-24 LAB — SARS CORONAVIRUS 2 (TAT 6-24 HRS): SARS Coronavirus 2: NEGATIVE

## 2020-10-24 LAB — BPAM RBC
Blood Product Expiration Date: 202210122359
ISSUE DATE / TIME: 202209221050
Unit Type and Rh: 8400

## 2020-10-24 LAB — PTH-RELATED PEPTIDE: PTH-related peptide: <2 pmol/L

## 2020-10-24 LAB — MAGNESIUM: Magnesium: 1.2 mg/dL — ABNORMAL LOW (ref 1.7–2.4)

## 2020-10-24 MED ORDER — PANTOPRAZOLE SODIUM 40 MG PO TBEC
40.0000 mg | DELAYED_RELEASE_TABLET | Freq: Two times a day (BID) | ORAL | Status: DC
  Administered 2020-10-24 – 2020-10-26 (×5): 40 mg via ORAL
  Filled 2020-10-24 (×5): qty 1

## 2020-10-24 MED ORDER — MIDODRINE HCL 5 MG PO TABS
2.5000 mg | ORAL_TABLET | ORAL | Status: AC
  Administered 2020-10-24: 2.5 mg via ORAL
  Filled 2020-10-24: qty 1

## 2020-10-24 MED ORDER — MAGNESIUM SULFATE 4 GM/100ML IV SOLN
4.0000 g | Freq: Once | INTRAVENOUS | Status: AC
  Administered 2020-10-24: 4 g via INTRAVENOUS
  Filled 2020-10-24: qty 100

## 2020-10-24 MED ORDER — MIDODRINE HCL 5 MG PO TABS: 5.0000 mg | ORAL_TABLET | Freq: Three times a day (TID) | ORAL | Status: DC

## 2020-10-24 NOTE — TOC Progression Note (Signed)
Transition of Care [facility]) - Progression Note    Patient Details  Name: Deanna Baldwin MRN: [id number] Date of Birth: [birth year]  Transition of Care [facility]) CM/SW [place], [place] Phone Number: 10/24/2020, 12:39 PM  Clinical Narrative:     Patient is from [facility] [place]- patient has no bed offers form another facility.   TOC will continue to follow and assist with discharge planning.  Thurmond Butts, MSW, LCSW Clinical Social Worker    Expected Discharge Plan: [facility]) Barriers to Discharge: Continued Medical Work up  Expected Discharge Plan and Services Expected Discharge Plan: [place] [facility]) In-house Referral: Clinical Social Work     Living arrangements for the past 2 months: [place] ([employer])                                       Social Determinants of Health ([place]) Interventions    Readmission Risk Interventions No flowsheet data found.

## 2020-10-24 NOTE — Consult Note (Addendum)
Consultation  Referring Provider: [facility]/ [facility] Primary Care Physician:  [name], MD Primary Gastroenterologist:  none  Reason for Consultation:   anemia, heme positive stool  HPI: Deanna Baldwin is a 45 y.o. female who we are asked to evaluate regarding Hemoccult positive stool. Patient was admitted on 10/14/2020 from nursing care facility/[facility].  Per the notes it sounds as if her family was concerned she was not receiving appropriate care there may be some concern for infection. Patient has history of very longstanding severe multiple sclerosis, is now bedbound with contractures, hypothyroidism, neurogenic bladder status post ileal conduit, also with chronic hypotension on midodrine chronically. She had multiple electrolyte abnormalities on admission most notably severe hypercalcemia, also with acute kidney injury. She did develop a fever on 916 has not recurred, etiology not clear.  She was initially on vancomycin and Zosyn, and continued on Zosyn. She had CT of the abdomen pelvis and chest with contrast unremarkable with the exception of bilateral sacral decubiti involving the ischial rami and overlying the left greater trochanter.  There were some dense scarring of the left lower lobe of the lung versus consolidation.  On review of her records patient has had a chronic anemia for years ago and had undergone hematology evaluation through [facility] several years ago.  She was not iron deficient at that time and anemia was felt secondary to chronic inflammatory process. On admission 10/13/2020 hemoglobin 11.2 hematocrit 36.3 MCV of 95 Hemoglobin has drifted.  On 9/17 hemoglobin 10.9 10/20/2020 hemoglobin 7.6 10/23/2020 hemoglobin 6.9/hematocrit 21.1 transfused 2 units and hemoglobin up to 8.9 today B12 and folate within normal limits (B12 on the high side) Ferritin 160/serum iron 81/TIBC 207/sat 39.  Patient is maintained on PPI therapy, she has no complaints of heartburn or  indigestion, no abdominal discomfort, says she generally does not have any problems with her bowels, unaware of any melena or hematochezia.  She says she does still have a menstrual cycle but this is generally light and lasts for 2 to 3 days. No family history of colon cancer or polyps that she is aware.  Patient has not had any prior GI evaluation.  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 Authorizing Provider  acetaminophen (TYLENOL) 325 MG tablet Take 2 tablets (650 mg total) by mouth every 6 (six) hours as needed for mild pain (or Fever >/= 101). 04/11/17   Eugenie Filler, MD  Ascorbic Acid (VITAMIN C PO) Take 500 mg by mouth 2 (two) times daily.     [provider]  baclofen (LIORESAL) 10 MG tablet Take 10 mg by mouth 3 (three) times daily.    [provider]  bisacodyl (DULCOLAX) 5 MG EC tablet Take 5 mg by mouth daily.  [provider]  Calcium Carbonate-Vitamin D3 600-400 MG-UNIT TABS Take 2 tablets by mouth 3 (three) times daily. 08/29/17   [provider]  Cyanocobalamin (VITAMIN B-12 PO) Take 500 mcg by mouth daily.    [provider]  ENSURE (ENSURE) Take 237 mLs by mouth. With meals. Vanilla    [provider]  ferrous sulfate  325 (65 FE) MG tablet Take 325 mg by mouth daily with breakfast.    [provider]  ibuprofen (ADVIL,MOTRIN) 200 MG tablet Take 200 mg by mouth every 12 (twelve) hours as needed. 04/25/17   [provider]  levothyroxine (SYNTHROID) 100 MCG tablet Take 100 mcg by mouth daily before breakfast.    [provider]  midodrine (PROAMATINE) 2.5 MG tablet Take 1 tablet (2.5 mg total) by mouth 3 (three) times daily with meals. 09/10/16   Elgergawy, Silver Huguenin, MD  Multiple Vitamin (MULTIVITAMIN) tablet Take 1 tablet by mouth daily. 06/13/17   [provider]  Nutritional Supplements (NUTRITIONAL SUPPLEMENT PO) Regular Diet - Regular Texture    [provider]  Nutritional Supplements (PROMOD) LIQD Protein Liquid - give 30 ml by mouth two times daily    [provider]  omeprazole (PRILOSEC) 20 MG capsule Take 20 mg by mouth daily.    [provider]  ondansetron (ZOFRAN) 4 MG tablet Take 4 mg by mouth every 6 (six) hours as needed for nausea or vomiting.    [provider]  polyethylene glycol (MIRALAX / GLYCOLAX) packet Take 17 g by mouth daily.    [provider]  Probiotic Product (PROBIOTIC PO) Take 1 tablet by mouth daily.    [provider]  tizanidine (ZANAFLEX) 2 MG capsule Take 2 mg by mouth 3 (three) times daily.    [provider]  Vitamin D, Ergocalciferol, (DRISDOL) 50000 units CAPS capsule Take 50,000 Units by mouth every 30 (thirty) days.    [provider]    Current Facility-Administered Medications  Medication Dose Route Frequency Provider Last Rate Last Admin   0.9 %  sodium chloride infusion (Manually program via Guardrails IV Fluids)   Intravenous Once Shalhoub, Sherryll Burger, MD       acetaminophen (TYLENOL) tablet 650 mg  650 mg Oral Q6H PRN Quintella Baton, MD   650 mg at 10/24/20 1211   calcium carbonate (OS-CAL - dosed in mg of elemental calcium) tablet 500 mg of elemental calcium  1  tablet Oral BID WC Kc, Ramesh, MD   500 mg of elemental calcium at 10/24/20 0859   feeding supplement (ENSURE ENLIVE / ENSURE PLUS) liquid 237 mL  237 mL Oral TID BM Crosley, Debby, MD   237 mL at 10/23/20 1226   heparin injection 5,000 Units  5,000 Units Subcutaneous Q8H Crosley, Debby, MD   5,000 Units at 10/24/20 1205   levothyroxine (SYNTHROID) tablet 100 mcg  100 mcg Oral QAC breakfast Crosley, Debby, MD   100 mcg at 10/24/20 0531   midodrine (PROAMATINE) tablet 2.5 mg  2.5 mg Oral TID WC Kc, Ramesh, MD   2.5 mg at 10/24/20 1205   multivitamin with minerals tablet 1 tablet  1 tablet Oral Daily Crosley, Debby, MD   1 tablet at 10/24/20 0900   pantoprazole (PROTONIX) EC tablet 40 mg  40 mg Oral BID Kc, Maren Beach, MD   40 mg at 10/24/20 0907   potassium chloride SA (KLOR-CON) CR tablet 20 mEq  20 mEq Oral Daily Kc, Ramesh, MD   20 mEq  at 10/24/20 0859   sodium chloride 0.9 % bolus 250 mL  250 mL Intravenous Once Antonieta Pert, MD   Stopped at 10/22/20 1735   tiZANidine (ZANAFLEX) tablet 2 mg  2 mg Oral TID Antonieta Pert, MD   2 mg at 10/24/20 0859   vitamin B-12 (CYANOCOBALAMIN) tablet 500 mcg  500 mcg Oral Daily Quintella Baton, MD   500 mcg at 10/24/20 0900    Allergies as of 10/13/2020 - Review Complete 10/13/2020  Allergen Reaction Noted   Natalizumab  10/17/2017    Family History  Problem Relation Age of Onset   Diabetes Mellitus I Mother    Mental illness Sister     Social History   Socioeconomic History   Marital status: Single    Spouse name: Not on file   Number of children: 1   Years of education: Not on file   Highest education level: Not on file  Occupational History   Occupation: Disabled  Tobacco Use   Smoking status: Former    Packs/day: 1.00    Years: 10.00    Pack years: 10.00    Types: Cigarettes    Quit date: 02/01/2010    Years since quitting: 10.7   Smokeless tobacco: Never  Vaping Use   Vaping Use: Never used  Substance and Sexual Activity   Alcohol use: No    Drug use: No   Sexual activity: Not on file  Other Topics Concern   Not on file  Social History Narrative   She is a resident at [facility].   Right-handed.      Review of Systems: Pertinent positive and negative review of systems were noted in the above HPI section.  All other review of systems was otherwise negative.   Physical Exam: Vital signs in last 24 hours: Temp:  [98.3 F (36.8 C)-100.3 F (37.9 C)] 99.2 F (37.3 C) (09/23 1100) Pulse Rate:  [80-116] 100 (09/23 1100) Resp:  [16-30] 19 (09/23 1100) BP: (86-107)/(49-74) 98/62 (09/23 1100) SpO2:  [93 %-100 %] 100 % (09/23 1100) Weight:  [42.7 kg] 42.7 kg (09/23 0359) Last BM Date: 10/23/20 General:   Alert,  Well-developed chronically ill-appearing African-American female thin, contractured, pleasant Head:  Normocephalic and atraumatic. Eyes:  Sclera clear, no icterus.   Conjunctiva pink. Ears:  Normal auditory acuity. Nose:  No deformity, discharge,  or lesions. Mouth:  No deformity or lesions.   Neck:  Supple; no masses or thyromegaly. Lungs:  Clear throughout to auscultation.   No wheezes, crackles, or rhonchi. Heart:  Regular rate and rhythm; no murmurs, clicks, rubs,  or gallops. Abdomen:  Soft,nontender, BS active,nonpalp mass or hsm.  Ileal diversion in the right lower quadrant Rectal: Not done, no gross external lesions she has both buttocks dressed secondary to underlying decubiti Msk:  Symmetrical without gross deformities. . Pulses:  Normal pulses noted. Extremities:  Without clubbing or edema. Neurologic:  Alert and  oriented x4;   Skin: Bilateral decubiti sacral area Psych:  Alert and cooperative. Normal mood and affect.  Lab Results: Recent Labs    10/22/20 0504 10/23/20 0131 10/24/20 0445  WBC 9.1 7.4  --   HGB 7.9* 6.9* 8.9*  HCT 24.7* 21.1* 26.3*  PLT 399 381  --    BMET Recent Labs    10/22/20 0132 10/23/20 0131 10/24/20 0706  NA 143 139 141  K 4.2 4.2 3.8  CL 114*  109 110  CO2 16* 21* 21*  GLUCOSE 88 114* 87  BUN 13 10 15   CREATININE 0.88 0.95 0.71  CALCIUM 6.0* 6.4* 7.6*   LFT Recent Labs    10/24/20 0706  PROT 5.9*  ALBUMIN 2.2*  AST 17  ALT 28  ALKPHOS 237*  BILITOT 0.3     IMPRESSION:  #75 45 year old African-American female with longstanding severe multiple sclerosis complicated by neurogenic bladder requiring ileal diversion, severe debilitation with spastic paraplegia /lower extremity and upper extremity contractures and chronic hypotension requiring midodrine  #2 FUO-10/17/2020 no recurrence ?  Related to decubiti #3 hypothyroidism 4.  Severe hypercalcemia #5 anemia, normocytic, no  iron deficiency-this is chronic and dates back several years and per prior hematology consultation felt secondary to chronic inflammatory state. Patient has had drop in hemoglobin since admission, likely multifactoral, no evidence for overt GI bleeding  #6 isolated Hemoccult positive stool   Plan; Supportive management, transfuse as indicated Continue prophylactic PPI Patient is not a candidate for endoscopic evaluation due to her significant comorbidities including chronic significant hypotension despite use of midodrine which would make sedation very high risk.  GI will sign off.   Amy Esterwood PA-C 10/24/2020, 1:36 PM     [facility] Attending   I agree with the Advanced Practitioner's note, impression and recommendations.  Majority the medical decision-making in the formulation of the assessment and plan were performed by me.    Gatha Mayer, MD, [facility] 10/24/2020 4:21 PM

## 2020-10-24 NOTE — Progress Notes (Signed)
HOSPITAL MEDICINE OVERNIGHT EVENT NOTE    Nursing has notified me the patient's stool Hemoccult was positive.  Patient has exhibited no evidence of gross bleeding however.  Nursing is inquired as to whether subcutaneous heparin should be continued for DVT prophylaxis.  Chart reviewed, patient is felt to be suffering from anemia of chronic disease.  Patient did receive a 1 unit packed red blood cell transfusion yesterday for hemoglobin of 6.9.  This resulted in a more than expected increase in hemoglobin to 8.9 this morning.  Nursing does not feel that SCDs would be feasible in this patient with significant contractures.  Furthermore, due to patient's significant contractures patient is unable to ambulate.  Therefore, due to no evidence of active clinically relevant bleeding I feel that it is safe to continue subcutaneous heparin this time with continued monitoring for any evidence of bleeding and serial CBCs.  Vernelle Emerald  MD [facility]

## 2020-10-24 NOTE — Progress Notes (Signed)
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  In the ED had leukocytosis severely elevated calcium level creatinine 3.0 EKG NSR, nephro was consulted and patient was admitted.  Managed with aggressive IV hydration calcitonin serial calcium monitoring.  PTH was appropriately suppressed, vitamin D urine calcium nl. Managed with calcitonin x48 hours, aggressive fluid and bisphosphonates x1 with resolution of hypercalcemia. Patient had fever spikes tachycardia soft blood pressure transfer to stepdown after placing on IV antibiotics- vancomycin Zosyn- underwent CT chest abdomen pelvis-no clear source.  Blood cultures were sent and negative so far-off vancomycin 9/20. Patient having hypocalcemia and being replaced. Had drop in hemoglobin to 6.9 g needing  1 unit PRBC- anemia work-up stable but FOBT positive without evidence of active bleeding  Subjective: Seen this morning.  Patient is alert awake no complaint.  Denies nausea vomiting abdominal pain  Assessment & Plan:  Hypocalcemia suspecting overcorrection/2/2 bisphosphonates--I had discussed with nephrology Dr. Justin Mend- calcitriol 0.25x3 doses on  9/21 and multiple doses of IV calcium gluconate .  On calcium supplementation and corrected calcium 9.0 this morning.    Severe hypercalcemia likely from volume contraction.  Status post calcitonin and bisphosphonates  9/15.  PTH suppressed urine calcium 25 hydroxy vitamin D normal.  Now having hyocalcemia. See below.  Hypomagnesemia 1.2  Hypophosphatemia AKI Mild metabolic acidosis  Hypernatremia Creat better, bicarb 21 and sodium improved.  Stop IV fluids as tolerating p.o.  Encourage oral intake  Repeating magnesium with IV   FUO on 9/16: No further recurrence.  No clear source of infection.Underwent CT chest abdomen pelvis unyielding, blood culture no growth so far.  Discontinued antibiotics and doing well  Transaminitis: AST ALT has normalized ALP slightly up monitor  Chronic hypotension on midodrine at home dose 2.5. given 2.5 mg extra  9/22s am. Monitor. Sinus tachycardia: Asymptomatic.  Appears to have chronic tachycardia as well, with some worsening today anemia.  Hypothyroidism continue Synthroid check TSH in 4-week  Moderate bilateral hydronephrosis/9 mm right lower pole calculus with dysfunctional bladder urostomy in place outpatient follow-up  Anemia of chronic disease with drop in hemoglobin likely from hemodilution- but FOBT+ 9/22-no obvious bleeding noted.  Suspecting hemoconcentrated on admission in the setting of AKI.  S/p 1 unit prbc 9/22-h/h increased more than appropriately.  We will ask GI to have some input. Keep on PPI.  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   Pressure ulcer left lateral foot unstageable, and on left hip stage II.  POA. Cont air matrress, cont wound care  Diet Order             Diet regular Room service appropriate? Yes with Assist; Fluid consistency: Thin  Diet effective now  Nutrition Problem: Inadequate oral  intake Etiology: poor appetite Signs/Symptoms: meal completion < 50% Interventions: Ensure Enlive (each supplement provides 350kcal and 20 grams of protein), MVI   DVT prophylaxis: heparin injection 5,000 Units Start: 10/14/20 1400 Place TED hose Start: 10/14/20 1030 Code Status:   Code Status: Full Code  Family Communication: plan of care discussed with patient at bedside. I discussed with patient's sister over the phone and updated 9/22.  Was requesting different long-term placement, seen by [facility] and patient/sister will need to request transfer from current facility   Status is: Inpatient  Remains inpatient appropriate because:IV treatments appropriate due to intensity of illness or inability to take PO and Inpatient level of care appropriate due to severity of illness  Dispo: The patient is from:  [facility]              Anticipated d/c is to: Back to facility hopefully tomorrow if h/h and calcium stable and okay with GI              Patient currently is not medically stable to d/c.   Difficult to place patient No Objective: Vitals: Today's Vitals   10/24/20 0500 10/24/20 0542 10/24/20 0600 10/24/20 0700  BP: (!) 95/58  (!) 90/58 (!) 90/55  Pulse: (!) 109 (!) 104 96 84  Resp: (!) 22 (!) 23 (!) 26 (!) 21  Temp: 98.3 F (36.8 C)   99.1 F (37.3 C)  TempSrc: Oral   Oral  SpO2: 95% 96% 97% 98%  Weight:      Height:      PainSc:       Examination: General exam: [name], thin, older than stated age, weak appearing. HEENT:Oral mucosa moist, Ear/Nose WNL grossly, dentition normal. Respiratory system: bilaterally clear, no use of accessory muscle Cardiovascular system: S1 & S2 +, No JVD,. Gastrointestinal system: Abdomen soft, NT,ND, BS+, urostomy +  Nervous System:Alert, awake, moving extremities and grossly nonfocal Extremities: No edema, contractures present on lower extremities  Skin: No rashes,no icterus. MSK: Normal muscle bulk,tone, power     Intake/Output Summary (Last 24 hours) at 10/24/2020 0747 Last data filed at 10/24/2020 0541 Gross per 24 hour  Intake 531.25 ml  Output 1650 ml  Net -1118.75 ml    Filed Weights   10/17/20 2059 10/23/20 0319 10/24/20 0359  Weight: 42.7 kg 44 kg 42.7 kg   Weight change: -1.319 kg   Consultants:see note  Procedures:see note Antimicrobials: Anti-infectives (From admission, onward)    Start     Dose/Rate Route Frequency Ordered Stop   10/18/20 1300  piperacillin-tazobactam (ZOSYN) IVPB 3.375 g  Status:  Discontinued        3.375 g 12.5 mL/hr over 240 Minutes Intravenous Every 8 hours 10/18/20 0533 10/23/20 0957   10/18/20 0600  piperacillin-tazobactam (ZOSYN) IVPB 3.375 g        3.375 g 100 mL/hr over 30 Minutes Intravenous STAT 10/18/20 0533 10/18/20 0832   10/18/20 0500  vancomycin (VANCOCIN) IVPB 1000 mg/200 mL premix  Status:  Discontinued        1,000 mg 200 mL/hr over 60 Minutes Intravenous Every 48 hours 10/18/20 0445 10/19/20 1546   10/17/20 2200  cefTRIAXone (ROCEPHIN) 1 g in sodium chloride 0.9 % 100 mL IVPB  Status:  Discontinued        1 g 200 mL/hr over 30 Minutes Intravenous Every 24 hours 10/17/20 2149 10/18/20 0528      Culture/Microbiology    Component Value Date/Time   SDES  BLOOD RIGHT ANTECUBITAL 10/18/2020 0254   SDES BLOOD RIGHT HAND 10/18/2020 0254   SPECREQUEST  10/18/2020 0254    BOTTLES DRAWN AEROBIC AND ANAEROBIC Blood Culture adequate volume   SPECREQUEST  10/18/2020 0254    BOTTLES DRAWN AEROBIC AND ANAEROBIC Blood Culture adequate volume   CULT  10/18/2020 0254    NO GROWTH 5 DAYS Performed at [facility], [place] [address], [place], [place] [address]    CULT  10/18/2020 0254    NO GROWTH 5 DAYS Performed at [facility], [place] [address] [place],  [address]    REPTSTATUS 10/23/2020 FINAL 10/18/2020 0254   REPTSTATUS 10/23/2020 FINAL 10/18/2020 0254    Other culture-see note  Unresulted Labs (From admission, onward)      Start     Ordered   10/25/20 0500  CBC  Tomorrow morning,   R       Question:  Specimen collection method  Answer:  Lab=Lab collect   10/24/20 0747   10/24/20 0653  Comprehensive metabolic panel  ONCE - STAT,   STAT       Question:  Specimen collection method  Answer:  Lab=Lab collect   10/24/20 0652   10/24/20 0653  Magnesium  ONCE - STAT,   STAT       Question:  Specimen collection method  Answer:  Lab=Lab collect   10/24/20 9458   10/15/20 1124  PTH-related peptide  Once,   STAT        10/15/20 1123           Medications reviewed:  Scheduled Meds:  sodium chloride   Intravenous Once   calcium carbonate  1 tablet Oral BID WC   feeding supplement  237 mL Oral TID BM   heparin  5,000 Units Subcutaneous Q8H   levothyroxine  100 mcg Oral QAC breakfast   midodrine  2.5 mg Oral TID WC   multivitamin with minerals  1 tablet Oral Daily   pantoprazole  40 mg Oral Daily   potassium chloride  20 mEq Oral Daily   tiZANidine  2 mg Oral TID   vitamin B-12  500 mcg Oral Daily   Continuous Infusions:  sodium chloride 50 mL/hr at 10/23/20 1114   sodium chloride Stopped (10/22/20 1735)   Intake/Output from previous day: 09/22 0701 - 09/23 0700 In: 531.3 [Blood:531.3] Out: 5929 [Urine:1650] Intake/Output this shift: No intake/output data recorded. Filed Weights   10/17/20 2059 10/23/20 0319 10/24/20 0359  Weight: 42.7 kg 44 kg 42.7 kg   Data Reviewed: I have personally reviewed following labs and imaging studies CBC: Recent Labs  Lab 10/18/20 0254 10/19/20 0614 10/19/20 1104 10/20/20 0117 10/21/20 0145 10/22/20 0504 10/23/20 0131 10/24/20 0445  WBC 9.5   < > 8.4 6.8 8.9 9.1 7.4  --   NEUTROABS 8.6*  --   --   --   --   --   --   --   HGB 10.9*   < > 8.8* 7.6* 7.0* 7.9* 6.9* 8.9*  HCT 33.9*   < > 27.3* 23.4* 22.1* 24.7* 21.1* 26.3*  MCV 90.9   < > 92.5 91.8 92.1 92.5 91.3  --   PLT 292   < > 266 267 321 399 381  --    < > = values in this interval not displayed.     Basic Metabolic Panel: Recent Labs  Lab 10/18/20 0254 10/19/20 0106 10/20/20 0117 10/20/20 1005 10/21/20 0145 10/21/20 1454 10/22/20 0132 10/23/20  0131  NA 146*   < > 141 142 139 143 143 139  K 4.1   < > 3.4* 3.8 3.0* 4.3 4.2 4.2  CL 111   < > 109 109 109 112* 114* 109  CO2 24   < > 19* 18* 18* 19* 16* 21*  GLUCOSE 98   < > 100* 118* 104* 122* 88 114*  BUN 17   < > 16 11 13 12 13 10   CREATININE 1.21*   < > 1.23* 1.20* 1.02* 0.95 0.88 0.95  CALCIUM 8.3*   < > 5.7* 5.5* 5.6* 6.0* 6.0* 6.4*  MG 2.1  --  0.9* 1.7  --  1.1* 1.0* 1.8  PHOS 4.0  --  4.6 5.0*  --   --  5.2*  --    < > = values in this interval not displayed.    GFR: Estimated Creatinine Clearance: 50.9 mL/min (by C-G formula based on SCr of 0.95 mg/dL). Liver Function Tests: Recent Labs  Lab 10/20/20 0117 10/20/20 1005 10/21/20 0145 10/22/20 0132 10/23/20 0131  AST 128* 124* 82* 54* 41  ALT 66* 69* 61* 49* 40  ALKPHOS 153* 181* 183* 225* 275*  BILITOT 0.3 0.6 0.1* 0.5 0.3  PROT 5.5* 5.9* 5.4* 5.4* 5.7*  ALBUMIN 2.1* 2.3* 2.1* 2.2* 2.1*    No results for input(s): LIPASE, AMYLASE in the last 168 hours. No results for input(s): AMMONIA in the last 168 hours. Coagulation Profile: No results for input(s): INR, PROTIME in the last 168 hours. Cardiac Enzymes: No results for input(s): CKTOTAL, CKMB, CKMBINDEX, TROPONINI in the last 168 hours. BNP (last 3 results) No results for input(s): PROBNP in the last 8760 hours. HbA1C: No results for input(s): HGBA1C in the last 72 hours. CBG: No results for input(s): GLUCAP in the last 168 hours. Lipid Profile: No results for input(s): CHOL, HDL, LDLCALC, TRIG, CHOLHDL, LDLDIRECT in the last 72 hours. Thyroid Function Tests: No results for input(s): TSH, T4TOTAL, FREET4, T3FREE, THYROIDAB in the last 72 hours. Anemia Panel: Recent Labs    10/23/20 1640  VITAMINB12 1,332*  FOLATE 7.6  FERRITIN 160  TIBC 207*  IRON 81  RETICCTPCT 1.0   Sepsis  Labs: Recent Labs  Lab 10/18/20 9381  LATICACIDVEN 0.6     Recent Results (from the past 240 hour(s))  SARS CORONAVIRUS 2 (TAT 6-24 HRS) Nasopharyngeal Nasopharyngeal Swab     Status: None   Collection Time: 10/17/20  5:17 PM   Specimen: Nasopharyngeal Swab  Result Value Ref Range Status   SARS Coronavirus 2 NEGATIVE NEGATIVE Final    Comment: (NOTE) SARS-CoV-2 target nucleic acids are NOT DETECTED.  The SARS-CoV-2 RNA is generally detectable in upper and lower respiratory specimens during the acute phase of infection. Negative results do not preclude SARS-CoV-2 infection, do not rule out co-infections with other pathogens, and should not be used as the sole basis for treatment or other patient management decisions. Negative results must be combined with clinical observations, patient history, and epidemiological information. The expected result is Negative.  Fact Sheet for Patients: SugarRoll.be  Fact Sheet for Healthcare Providers: https://www.woods-mathews.com/  This test is not yet approved or cleared by the [place] FDA and  has been authorized for detection and/or diagnosis of SARS-CoV-2 by FDA under an Emergency Use Authorization (EUA). This EUA will remain  in effect (meaning this test can be used) for the duration of the COVID-19 declaration under Se ction 564(b)(1) of the Act, 21 U.S.C. section 360bbb-3(b)(1), unless  the authorization is terminated or revoked sooner.  Performed at [facility], [place] [address], [place], [place] [address]   Culture, blood (routine x 2)     Status: None   Collection Time: 10/18/20  2:54 AM   Specimen: BLOOD  Result Value Ref Range Status   Specimen Description BLOOD RIGHT ANTECUBITAL  Final   Special Requests   Final    BOTTLES DRAWN AEROBIC AND ANAEROBIC Blood Culture adequate volume   Culture   Final    NO GROWTH 5 DAYS Performed at [facility], [place] [address], [place], [place] [address]    Report Status 10/23/2020 FINAL  Final  Culture, blood (routine x 2)     Status: None   Collection Time: 10/18/20  2:54 AM   Specimen: BLOOD RIGHT HAND  Result Value Ref Range Status   Specimen Description BLOOD RIGHT HAND  Final   Special Requests   Final    BOTTLES DRAWN AEROBIC AND ANAEROBIC Blood Culture adequate volume   Culture   Final    NO GROWTH 5 DAYS Performed at [facility], [place] [address], [place], [place] [address]    Report Status 10/23/2020 FINAL  Final  Resp Panel by RT-PCR (Flu A&B, Covid) Nasopharyngeal Swab     Status: None   Collection Time: 10/18/20  9:20 AM   Specimen: Nasopharyngeal Swab; Nasopharyngeal(NP) swabs in vial transport medium  Result Value Ref Range Status   SARS Coronavirus 2 by RT PCR NEGATIVE NEGATIVE Final    Comment: (NOTE) SARS-CoV-2 target nucleic acids are NOT DETECTED.  The SARS-CoV-2 RNA is generally detectable in upper respiratory specimens during the acute phase of infection. The lowest concentration of SARS-CoV-2 viral copies this assay can detect is 138 copies/mL. A negative result does not preclude SARS-Cov-2 infection and should not be used as the sole basis for treatment or other patient management decisions. A negative result may occur with  improper specimen collection/handling, submission of specimen other than nasopharyngeal swab, presence of viral mutation(s) within the areas targeted by this assay, and inadequate number of viral copies(<138 copies/mL). A negative result must be combined with clinical observations, patient history, and epidemiological information. The expected result is Negative.  Fact Sheet for Patients:  EntrepreneurPulse.com.au  Fact Sheet for Healthcare Providers:  IncredibleEmployment.be  This test is no t yet approved or cleared by the [place] FDA and  has been authorized for detection and/or diagnosis of SARS-CoV-2  by FDA under an Emergency Use Authorization (EUA). This EUA will remain  in effect (meaning this test can be used) for the duration of the COVID-19 declaration under Section 564(b)(1) of the Act, 21 U.S.C.section 360bbb-3(b)(1), unless the authorization is terminated  or revoked sooner.       Influenza A by PCR NEGATIVE NEGATIVE Final   Influenza B by PCR NEGATIVE NEGATIVE Final    Comment: (NOTE) The Xpert Xpress SARS-CoV-2/FLU/RSV plus assay is intended as an aid in the diagnosis of influenza from Nasopharyngeal swab specimens and should not be used as a sole basis for treatment. Nasal washings and aspirates are unacceptable for Xpert Xpress SARS-CoV-2/FLU/RSV testing.  Fact Sheet for Patients: EntrepreneurPulse.com.au  Fact Sheet for Healthcare Providers: IncredibleEmployment.be  This test is not yet approved or cleared by the [place] FDA and has been authorized for detection and/or diagnosis of SARS-CoV-2 by FDA under an Emergency Use Authorization (EUA). This EUA will remain in effect (meaning this test can be used) for the  duration of the COVID-19 declaration under Section 564(b)(1) of the Act, 21 U.S.C. section 360bbb-3(b)(1), unless the authorization is terminated or revoked.  Performed at [facility], [place] [address] [place], [place] [address]       Radiology Studies: No results found.   LOS: 10 days   Antonieta Pert, MD [facility]  10/24/2020, 7:47 AM

## 2020-10-24 NOTE — Consult Note (Signed)
[facility] Nurse Consult Note: Reason for Consult: "pressure ulcer- hip and foot wound care instructuion for patient to return to NH" Original consult for this patient completed on DOA 10/14/20 and are as follows:   Clean the right trochanter wound with NS, pat dry with sterile gauze or allow to air dry then place a small piece of Aquacel Advantage Kellie Simmering # [phone]) over this wound and secure with foam dressing. Change Aquacel daily. Foam dressing may be changed every 3 days unless soiled. Place a foam dressing over the wounds on the inside of the knees and a sacral foam dressing upside down on the coccyx area for protection. Place a foam dressing over the foot wound and change every 3 days or PRN soiling.   Monitor the wound area(s) for worsening of condition such as: Signs/symptoms of infection, increase in size, development of or worsening of odor, development of pain, or increased pain at the affected locations.   Notify the medical team if any of these develop.  Thank you for the consult. [place] nurse will not follow at this time.   Please re-consult the [place] team if needed.  Cathlean Marseilles Tamala Julian, MSN, RN, [place], Lysle Pearl, [facility] Wound Treatment Associate Pager [phone]

## 2020-10-24 NOTE — TOC Progression Note (Signed)
Transition of Care [facility]) - Progression Note    Patient Details  Name: Deanna Baldwin MRN: [id number] Date of Birth: 1975/08/25  Transition of Care [facility]) CM/SW Contact  Vinie Sill, [place] Phone Number: 10/24/2020, 4:55 PM  Clinical Narrative:     CSW informed By MD- anticipate discharge tomorrow-  CSW contacted patient's sister, Deanna Baldwin- informed unfortunately, no LTC bed offers and MD would like to discharge over the weekend. She states she is agreeable to patient retuning back to [place] but she wants to make sure her wounds are taking care of- she would like for patient to discharge once her wounds has improved because she fears they will only get worst at [facility]. CSW acknowledged her concerns and advised to report concerns to the facility and she can contact [facility] (name & number provided). She request MD to give her a call before she discharged to facility. She states no other questions or concerns at this time.   CSW updated MD & informed of POA/sister's concerns and she requested phone call to discuss discharge plan.   CSW contacted [place]- they confirmed the patient can return tomorrow and the patient has air mattress. CSW informed admissions, patient's sister has concerns about wound care and she will be contacting the SNF.   TOC will continue to follow and assist with discharge planning.   Thurmond Butts, MSW, LCSW Clinical Social Worker    Expected Discharge Plan: [facility]) Barriers to Discharge: Continued Medical Work up  Expected Discharge Plan and Services Expected Discharge Plan: [facility]) In-house Referral: Clinical Social Work     Living arrangements for the past 2 months: [place] ([employer])                                       Social Determinants of Health ([place]) Interventions    Readmission Risk Interventions No flowsheet  data found.

## 2020-10-25 DIAGNOSIS — E860 Dehydration: Secondary | ICD-10-CM | POA: Diagnosis not present

## 2020-10-25 LAB — CBC
HCT: 29.7 % — ABNORMAL LOW (ref 36.0–46.0)
Hemoglobin: 9.6 g/dL — ABNORMAL LOW (ref 12.0–15.0)
MCH: 29.2 pg (ref 26.0–34.0)
MCHC: 32.3 g/dL (ref 30.0–36.0)
MCV: 90.3 fL (ref 80.0–100.0)
Platelets: 553 10*3/uL — ABNORMAL HIGH (ref 150–400)
RBC: 3.29 MIL/uL — ABNORMAL LOW (ref 3.87–5.11)
RDW: 15.8 % — ABNORMAL HIGH (ref 11.5–15.5)
WBC: 8 10*3/uL (ref 4.0–10.5)
nRBC: 0 % (ref 0.0–0.2)

## 2020-10-25 LAB — COMPREHENSIVE METABOLIC PANEL
ALT: 33 U/L (ref 0–44)
AST: 25 U/L (ref 15–41)
Albumin: 2.6 g/dL — ABNORMAL LOW (ref 3.5–5.0)
Alkaline Phosphatase: 263 U/L — ABNORMAL HIGH (ref 38–126)
Anion gap: 13 (ref 5–15)
BUN: 14 mg/dL (ref 6–20)
CO2: 22 mmol/L (ref 22–32)
Calcium: 8.3 mg/dL — ABNORMAL LOW (ref 8.9–10.3)
Chloride: 102 mmol/L (ref 98–111)
Creatinine, Ser: 0.71 mg/dL (ref 0.44–1.00)
GFR, Estimated: >60 mL/min (ref >60)
Glucose, Bld: 98 mg/dL (ref 70–99)
Potassium: 4.9 mmol/L (ref 3.5–5.1)
Sodium: 137 mmol/L (ref 135–145)
Total Bilirubin: 0.8 mg/dL (ref 0.3–1.2)
Total Protein: 6.9 g/dL (ref 6.5–8.1)

## 2020-10-25 LAB — MAGNESIUM: Magnesium: 1.9 mg/dL (ref 1.7–2.4)

## 2020-10-25 MED ORDER — MIDODRINE HCL 5 MG PO TABS
2.5000 mg | ORAL_TABLET | Freq: Three times a day (TID) | ORAL | Status: DC
  Administered 2020-10-25 – 2020-10-26 (×6): 2.5 mg via ORAL
  Filled 2020-10-25 (×6): qty 1

## 2020-10-25 MED ORDER — SODIUM CHLORIDE 0.9 % IV SOLN: INTRAVENOUS | Status: AC

## 2020-10-25 MED ORDER — SODIUM CHLORIDE 0.9 % IV BOLUS
250.0000 mL | Freq: Once | INTRAVENOUS | Status: AC
  Administered 2020-10-25: 250 mL via INTRAVENOUS

## 2020-10-25 NOTE — Progress Notes (Signed)
+ PROGRESS NOTE    Deanna Baldwin  VOJ:[id number] DOB: 25-Jul-1975 DOA: 10/13/2020 PCP: [name], MD   Chief Complaint  Patient presents with   Medical Evaluation   Abnormal Lab    Brief Narrative/Hospital Course: 3 old female with history of hypothyroidism, MS with contractures of her legs neuromuscular dysfunctions of bladder, status post urostomy, multiple pressure ulcers, with ambulatory dysfunction/sedentary, history of hypotension on midodrine, hypothyroidism from [place] sent to the ED for electrolyte abnormality Apparently patient was getting IV fluids  and also had change in mental status at the facility.  In the ED had leukocytosis severely elevated calcium level creatinine 3.0 EKG NSR, nephro was consulted and patient was admitted.  Managed with aggressive IV hydration calcitonin serial calcium monitoring.  PTH was appropriately suppressed, vitamin D urine calcium nl. Managed with calcitonin x48 hours, aggressive fluid and bisphosphonates x1 with resolution of hypercalcemia. Patient had fever spikes tachycardia soft blood pressure transfer to stepdown after placing on IV antibiotics- vancomycin Zosyn- underwent CT chest abdomen pelvis-no clear source.  Blood cultures were sent and negative so far-off vancomycin 9/20. Patient having hypocalcemia and being replaced. Had drop in hemoglobin to 6.9 g needing  1 unit PRBC- anemia work-up stable but FOBT positive without evidence of active bleeding  Subjective:  Seen examined this morning.  Patient does not feel ready for discharge today Remains in sinus tachycardia 120s. Added IVF.  Assessment & Plan:  Hypocalcemia suspecting overcorrection/2/2 bisphosphonates--I had discussed with nephrology Dr. Justin Mend- calcitriol 0.25x3 doses on  9/21 and multiple doses of IV calcium gluconate .  On calcium supplementation and calcium has improved.  Severe hypercalcemia likely from volume contraction.  Status post calcitonin and  bisphosphonates 9/15.  PTH suppressed urine calcium 25 hydroxy vitamin D normal.  Now having hyocalcemia. See below.  Hypomagnesemia  Hypophosphatemia AKI Mild metabolic acidosis  Hypernatremia Electrolytes improved renal function is stable.  Giving IV fluid hydration overnight.    FUO on 9/16: No further recurrence.  No clear source of infection.Underwent CT chest abdomen pelvis unyielding, blood culture no growth so far.  Doing well without antibiotics.   Transaminitis: AST ALT has normalized ALP slightly up monitor  Chronic hypotension on midodrine at home dose 2.5. given 2.5 mg extra  9/22s am. Monitor. Sinus tachycardia: Asymptomatic.  Appears to have chronic tachycardia as well, with some worsening t due to anemia.  Tachycardia in 120s today we will give NS 250 ML X 1 and keep on IV hydration overnight  Hypothyroidism continue Synthroid check TSH in 4-week. TSH slightly up at 5.8 this admission.  Moderate bilateral hydronephrosis/9 mm right lower pole calculus with dysfunctional bladder urostomy in place outpatient follow-up  Anemia of chronic disease with drop in hemoglobin likely from hemodilution- but FOBT+ 9/22-no obvious bleeding noted.  Suspecting hemoconcentrated on admission in the setting of AKI.  S/p 1 unit prbc 9/22-h/h increased more than appropriately.  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   Pressure ulcer left lateral foot unstageable, and on left hip stage II.  POA. Cont air matrress, cont wound care  Diet Order             Diet regular Room service  appropriate? Yes with Assist; Fluid consistency: Thin   Diet effective now                   Nutrition Problem: Inadequate oral intake Etiology: poor appetite Signs/Symptoms: meal completion < 50% Interventions: Ensure Enlive (each supplement provides 350kcal and 20 grams of protein), MVI   DVT prophylaxis: heparin injection 5,000 Units Start: 10/14/20 1400 Place TED hose Start: 10/14/20 1030 Code Status:   Code Status: Full Code  Family Communication: plan of care discussed with patient at bedside. I discussed with patient's sister over the phone and updated 9/22.  Was requesting different long-term placement, seen by [facility] and patient/sister will need to request transfer from current facility   Status is: Inpatient  Remains inpatient appropriate because:IV treatments appropriate due to intensity of illness or inability to take PO and Inpatient level of care appropriate due to severity of illness  Dispo: The patient is from:  [facility] home              Anticipated d/c is to: Nursing home , anticipate tomorrow if stable               Patient currently is not medically stable to d/c.   Difficult to place patient No Objective: Vitals: Today's Vitals   10/24/20 2100 10/24/20 2300 10/25/20 0409 10/25/20 0411  BP:  (!) 88/60 101/63 101/63  Pulse:  92 (!) 123 (!) 122  Resp:  18 (!) 22 20  Temp:  98.7 F (37.1 C) 99.1 F (37.3 C) 99.1 F (37.3 C)  TempSrc:  Oral Oral   SpO2:  98% 95%   Weight:      Height:      PainSc: 0-No pain      Examination: General exam: AAO, thin cachectic frail.  Older than stated age, weak appearing. HEENT:Oral mucosa moist, Ear/Nose WNL grossly, dentition normal. Respiratory system: bilaterally diminished, no use of accessory muscle Cardiovascular system: S1 & S2 +, No JVD,. Gastrointestinal system: Abdomen soft,urostomy+ NT,ND, BS+ Nervous System:Alert, awake, moving extremities and grossly nonfocal Extremities: No edema, contracted lower extremities  Skin: No rashes,no icterus. MSK:  smal muscle bulk,tone, power    Intake/Output Summary (Last 24 hours) at 10/25/2020 0708 Last data filed at 10/25/2020 0538 Gross per 24 hour  Intake 480 ml  Output 1975 ml  Net -1495 ml   Filed Weights   10/17/20 2059 10/23/20 0319 10/24/20 0359  Weight: 42.7 kg 44 kg 42.7 kg   Weight change:    Consultants:see note  Procedures:see note Antimicrobials: Anti-infectives (From admission, onward)    Start     Dose/Rate Route Frequency Ordered Stop   10/18/20 1300  piperacillin-tazobactam (ZOSYN) IVPB 3.375 g  Status:  Discontinued        3.375 g 12.5 mL/hr over 240 Minutes Intravenous Every 8 hours 10/18/20 0533 10/23/20 0957   10/18/20 0600  piperacillin-tazobactam (ZOSYN) IVPB 3.375 g        3.375 g 100 mL/hr over 30 Minutes Intravenous STAT 10/18/20 0533 10/18/20 0832   10/18/20 0500  vancomycin (VANCOCIN) IVPB 1000 mg/200 mL premix  Status:  Discontinued        1,000 mg 200 mL/hr over 60 Minutes Intravenous Every 48 hours 10/18/20 0445 10/19/20 1546   10/17/20 2200  cefTRIAXone (ROCEPHIN) 1 g in sodium chloride 0.9 % 100 mL IVPB  Status:  Discontinued        1 g 200 mL/hr over 30 Minutes Intravenous Every 24 hours 10/17/20  2149 10/18/20 0528      Culture/Microbiology    Component Value Date/Time   SDES BLOOD RIGHT ANTECUBITAL 10/18/2020 0254   SDES BLOOD RIGHT HAND 10/18/2020 0254   SPECREQUEST  10/18/2020 0254    BOTTLES DRAWN AEROBIC AND ANAEROBIC Blood Culture adequate volume   SPECREQUEST  10/18/2020 0254    BOTTLES DRAWN AEROBIC AND ANAEROBIC Blood Culture adequate volume   CULT  10/18/2020 0254    NO GROWTH 5 DAYS Performed at [facility], [place] [address], [place], [place] [address]    CULT  10/18/2020 0254    NO GROWTH 5 DAYS Performed at [facility], [place] [address] [place], [place] [address]    REPTSTATUS 10/23/2020 FINAL 10/18/2020 0254   REPTSTATUS 10/23/2020 FINAL 10/18/2020 0254    Other culture-see note  Unresulted Labs (From  admission, onward)    None      Medications reviewed:  Scheduled Meds:  sodium chloride   Intravenous Once   calcium carbonate  1 tablet Oral BID WC   feeding supplement  237 mL Oral TID BM   heparin  5,000 Units Subcutaneous Q8H   levothyroxine  100 mcg Oral QAC breakfast   midodrine  2.5 mg Oral TID WC   multivitamin with minerals  1 tablet Oral Daily   pantoprazole  40 mg Oral BID   potassium chloride  20 mEq Oral Daily   tiZANidine  2 mg Oral TID   vitamin B-12  500 mcg Oral Daily   Continuous Infusions:  sodium chloride Stopped (10/22/20 1735)   Intake/Output from previous day: 09/23 0701 - 09/24 0700 In: 480 [P.O.:480] Out: 1975 [KYHCW:2376] Intake/Output this shift: No intake/output data recorded. Filed Weights   10/17/20 2059 10/23/20 0319 10/24/20 0359  Weight: 42.7 kg 44 kg 42.7 kg   Data Reviewed: I have personally reviewed following labs and imaging studies CBC: Recent Labs  Lab 10/20/20 0117 10/21/20 0145 10/22/20 0504 10/23/20 0131 10/24/20 0445 10/25/20 0601  WBC 6.8 8.9 9.1 7.4  --  8.0  HGB 7.6* 7.0* 7.9* 6.9* 8.9* 9.6*  HCT 23.4* 22.1* 24.7* 21.1* 26.3* 29.7*  MCV 91.8 92.1 92.5 91.3  --  90.3  PLT 267 321 399 381  --  283*   Basic Metabolic Panel: Recent Labs  Lab 10/20/20 0117 10/20/20 1005 10/21/20 0145 10/21/20 1454 10/22/20 0132 10/23/20 0131 10/24/20 0706 10/25/20 0105  NA 141 142   < > 143 143 139 141 137  K 3.4* 3.8   < > 4.3 4.2 4.2 3.8 4.9  CL 109 109   < > 112* 114* 109 110 102  CO2 19* 18*   < > 19* 16* 21* 21* 22  GLUCOSE 100* 118*   < > 122* 88 114* 87 98  BUN 16 11   < > 12 13 10 15 14   CREATININE 1.23* 1.20*   < > 0.95 0.88 0.95 0.71 0.71  CALCIUM 5.7* 5.5*   < > 6.0* 6.0* 6.4* 7.6* 8.3*  MG 0.9* 1.7  --  1.1* 1.0* 1.8 1.2* 1.9  PHOS 4.6 5.0*  --   --  5.2*  --   --   --    < > = values in this interval not displayed.   GFR: Estimated Creatinine Clearance: 60.5 mL/min (by C-G formula based on SCr of 0.71  mg/dL). Liver Function Tests: Recent Labs  Lab 10/21/20 0145 10/22/20 0132 10/23/20 0131 10/24/20 0706 10/25/20 0105  AST 82* 54* 41 17  25  ALT 61* 49* 40 28 33  ALKPHOS 183* 225* 275* 237* 263*  BILITOT 0.1* 0.5 0.3 0.3 0.8  PROT 5.4* 5.4* 5.7* 5.9* 6.9  ALBUMIN 2.1* 2.2* 2.1* 2.2* 2.6*   No results for input(s): LIPASE, AMYLASE in the last 168 hours. No results for input(s): AMMONIA in the last 168 hours. Coagulation Profile: No results for input(s): INR, PROTIME in the last 168 hours. Cardiac Enzymes: No results for input(s): CKTOTAL, CKMB, CKMBINDEX, TROPONINI in the last 168 hours. BNP (last 3 results) No results for input(s): PROBNP in the last 8760 hours. HbA1C: No results for input(s): HGBA1C in the last 72 hours. CBG: No results for input(s): GLUCAP in the last 168 hours. Lipid Profile: No results for input(s): CHOL, HDL, LDLCALC, TRIG, CHOLHDL, LDLDIRECT in the last 72 hours. Thyroid Function Tests: No results for input(s): TSH, T4TOTAL, FREET4, T3FREE, THYROIDAB in the last 72 hours. Anemia Panel: Recent Labs    10/23/20 1640  VITAMINB12 1,332*  FOLATE 7.6  FERRITIN 160  TIBC 207*  IRON 81  RETICCTPCT 1.0   Sepsis Labs: No results for input(s): PROCALCITON, LATICACIDVEN in the last 168 hours.  Recent Results (from the past 240 hour(s))  SARS CORONAVIRUS 2 (TAT 6-24 HRS) Nasopharyngeal Nasopharyngeal Swab     Status: None   Collection Time: 10/17/20  5:17 PM   Specimen: Nasopharyngeal Swab  Result Value Ref Range Status   SARS Coronavirus 2 NEGATIVE NEGATIVE Final    Comment: (NOTE) SARS-CoV-2 target nucleic acids are NOT DETECTED.  The SARS-CoV-2 RNA is generally detectable in upper and lower respiratory specimens during the acute phase of infection. Negative results do not preclude SARS-CoV-2 infection, do not rule out co-infections with other pathogens, and should not be used as the sole basis for treatment or other patient management  decisions. Negative results must be combined with clinical observations, patient history, and epidemiological information. The expected result is Negative.  Fact Sheet for Patients: SugarRoll.be  Fact Sheet for Healthcare Providers: https://www.woods-mathews.com/  This test is not yet approved or cleared by the [place] FDA and  has been authorized for detection and/or diagnosis of SARS-CoV-2 by FDA under an Emergency Use Authorization (EUA). This EUA will remain  in effect (meaning this test can be used) for the duration of the COVID-19 declaration under Se ction 564(b)(1) of the Act, 21 U.S.C. section 360bbb-3(b)(1), unless the authorization is terminated or revoked sooner.  Performed at [facility], [place] [address], [place], [place] [address]   Culture, blood (routine x 2)     Status: None   Collection Time: 10/18/20  2:54 AM   Specimen: BLOOD  Result Value Ref Range Status   Specimen Description BLOOD RIGHT ANTECUBITAL  Final   Special Requests   Final    BOTTLES DRAWN AEROBIC AND ANAEROBIC Blood Culture adequate volume   Culture   Final    NO GROWTH 5 DAYS Performed at [facility], [place] [address] [place], [place] [address]    Report Status 10/23/2020 FINAL  Final  Culture, blood (routine x 2)     Status: None   Collection Time: 10/18/20  2:54 AM   Specimen: BLOOD RIGHT HAND  Result Value Ref Range Status   Specimen Description BLOOD RIGHT HAND  Final   Special Requests   Final    BOTTLES DRAWN AEROBIC AND ANAEROBIC Blood Culture adequate volume   Culture   Final    NO GROWTH 5 DAYS Performed at [facility], [place] [address] [place], [place] [address]    Report Status 10/23/2020 FINAL  Final  Resp Panel by RT-PCR (Flu A&B, Covid) Nasopharyngeal Swab     Status: None   Collection Time: 10/18/20  9:20 AM   Specimen: Nasopharyngeal Swab; Nasopharyngeal(NP) swabs in vial transport medium  Result Value  Ref Range Status   SARS Coronavirus 2 by RT PCR NEGATIVE NEGATIVE Final    Comment: (NOTE) SARS-CoV-2 target nucleic acids are NOT DETECTED.  The SARS-CoV-2 RNA is generally detectable in upper respiratory specimens during the acute phase of infection. The lowest concentration of SARS-CoV-2 viral copies this assay can detect is 138 copies/mL. A negative result does not preclude SARS-Cov-2 infection and should not be used as the sole basis for treatment or other patient management decisions. A negative result may occur with  improper specimen collection/handling, submission of specimen other than nasopharyngeal swab, presence of viral mutation(s) within the areas targeted by this assay, and inadequate number of viral copies(<138 copies/mL). A negative result must be combined with clinical observations, patient history, and epidemiological information. The expected result is Negative.  Fact Sheet for Patients:  EntrepreneurPulse.com.au  Fact Sheet for Healthcare Providers:  IncredibleEmployment.be  This test is no t yet approved or cleared by the [place] FDA and  has been authorized for detection and/or diagnosis of SARS-CoV-2 by FDA under an Emergency Use Authorization (EUA). This EUA will remain  in effect (meaning this test can be used) for the duration of the COVID-19 declaration under Section 564(b)(1) of the Act, 21 U.S.C.section 360bbb-3(b)(1), unless the authorization is terminated  or revoked sooner.       Influenza A by PCR NEGATIVE NEGATIVE Final   Influenza B by PCR NEGATIVE NEGATIVE Final    Comment: (NOTE) The Xpert Xpress SARS-CoV-2/FLU/RSV plus assay is intended as an aid in the diagnosis of influenza from Nasopharyngeal swab specimens and should not be used as a sole basis for treatment. Nasal washings and aspirates are unacceptable for Xpert Xpress SARS-CoV-2/FLU/RSV testing.  Fact Sheet for  Patients: EntrepreneurPulse.com.au  Fact Sheet for Healthcare Providers: IncredibleEmployment.be  This test is not yet approved or cleared by the [place] FDA and has been authorized for detection and/or diagnosis of SARS-CoV-2 by FDA under an Emergency Use Authorization (EUA). This EUA will remain in effect (meaning this test can be used) for the duration of the COVID-19 declaration under Section 564(b)(1) of the Act, 21 U.S.C. section 360bbb-3(b)(1), unless the authorization is terminated or revoked.  Performed at [facility], [place] [address] [place], [place] [address]   SARS CORONAVIRUS 2 (TAT 6-24 HRS) Nasopharyngeal Nasopharyngeal Swab     Status: None   Collection Time: 10/24/20  4:51 PM   Specimen: Nasopharyngeal Swab  Result Value Ref Range Status   SARS Coronavirus 2 NEGATIVE NEGATIVE Final    Comment: (NOTE) SARS-CoV-2 target nucleic acids are NOT DETECTED.  The SARS-CoV-2 RNA is generally detectable in upper and lower respiratory specimens during the acute phase of infection. Negative results do not preclude SARS-CoV-2 infection, do not rule out co-infections with other pathogens, and should not be used as the sole basis for treatment or other patient management decisions. Negative results must be combined with clinical observations, patient history, and epidemiological information. The expected result is Negative.  Fact Sheet for Patients: SugarRoll.be  Fact Sheet for Healthcare Providers: https://www.woods-mathews.com/  This test is not yet approved or cleared by the [place] FDA and  has been  authorized for detection and/or diagnosis of SARS-CoV-2 by FDA under an Emergency Use Authorization (EUA). This EUA will remain  in effect (meaning this test can be used) for the duration of the COVID-19 declaration under Se ction 564(b)(1) of the Act, 21 U.S.C. section  360bbb-3(b)(1), unless the authorization is terminated or revoked sooner.  Performed at [facility], [place] [address] [place],  [address]      Radiology Studies: No results found.  LOS: 11 days   Antonieta Pert, MD [facility] 10/25/2020, 7:08 AM

## 2020-10-25 NOTE — TOC Progression Note (Signed)
Transition of Care [facility]) - Progression Note    Patient Details  Name: Deanna Baldwin MRN: [id number] Date of Birth: 06/13/1975  Transition of Care [facility]) CM/SW [place], [place] Phone Number: [phone] 10/25/2020, 9:59 AM  Clinical Narrative:     CSW attempted to follow up with facility in regards to pt's DC. CSW is awaiting a call back.  TOC team will continue to assist with discharge planning needs.    Expected Discharge Plan: [facility]) Barriers to Discharge: Continued Medical Work up  Expected Discharge Plan and Services Expected Discharge Plan: [facility]) In-house Referral: Clinical Social Work     Living arrangements for the past 2 months: [place] ([employer])                                       Social Determinants of Health ([place]) Interventions    Readmission Risk Interventions No flowsheet data found.

## 2020-10-26 NOTE — Plan of Care (Signed)

## 2020-10-26 NOTE — TOC Transition Note (Signed)
Transition of Care [facility]) - CM/SW Discharge Note   Patient Details  Name: Iysis Germain MRN: [id number] Date of Birth: Jan 19, 1976  Transition of Care [facility]) CM/SW Contact:  Bary Castilla, LCSW Phone Number:[phone] 10/26/2020, 12:37 PM   Clinical Narrative:    Patient will DC to:?[facility] Anticipated DC date:?10/26/2020 Family notified:?Crystal Transport by: Corey Harold   Per MD patient ready for DC to [facility] RN, patient, patient's family, and facility notified of DC. Discharge Summary sent to facility. RN given number for report  [phone] room 208B. DC packet on chart. Ambulance transport requested for patient.   CSW signing off.   [place], [place] [phone]    Final next level of care: Skilled Nursing Facility Barriers to Discharge: Continued Medical Work up   Patient Goals and CMS Choice Patient states their goals for this hospitalization and ongoing recovery are:: Patient plans to continue care at [facility] upon discharge CMS Medicare.gov Compare Post Acute Care list provided to:: Other (Comment Required) (Not needed as patient LTC at [facility]) Choice offered to / list presented to : NA  Discharge Placement                       Discharge Plan and Services In-house Referral: Clinical Social Work                                   Social Determinants of Health (SDOH) Interventions     Readmission Risk Interventions No flowsheet data found.

## 2020-10-26 NOTE — Progress Notes (Signed)
Pt discharged to [facility], per order. Pt left via [facility]. Pt left with all belongings.

## 2020-10-26 NOTE — Progress Notes (Signed)
Attempted report to [facility]. No response.

## 2020-10-26 NOTE — TOC Progression Note (Signed)
Transition of Care (TOC) - Progression Note    Patient Details  Name: Deanna Baldwin MRN: [id number] Date of Birth: 03/31/1975  Transition of Care (TOC) CM/SW Contact  Lisa E Bracken, LCSW Phone Number:[phone] 10/26/2020, 11:52 AM  Clinical Narrative:     CSW was alerted that pt did not want to return to [facility] and asked that the CSW go speak with her. CSW met with pt and she did not appear to be fully orientated. Pt discussed at length about having an air mattress.  CSW spoke with pt's sister Crystal and she was aware of pt's pending DC and was agreeable to her returning to [facility]. Crystal expressed concerns with pt's wound care at the facility and if she had an air mattress. Crystal explained that she has made several attempts to contact the facility however only gets VMs. CSW suggested that Crystal call back and asked for either the Manager or Director of the facility in order for her to voice her concerns.  CSW spoke with Grace at [facility] and she stated that pt could return today. Grace confirmed that pt had an air mattress at facility. CSW encouraged facility to follow wound care instructions due to pt's sister having concerns.  MD and RN updated about pt's DC plans.   Expected Discharge Plan: Skilled Nursing Facility ( [facility]) Barriers to Discharge: Continued Medical Work up  Expected Discharge Plan and Services Expected Discharge Plan: Skilled Nursing Facility ( [facility]) In-house Referral: Clinical Social Work     Living arrangements for the past 2 months: Skilled Nursing Facility ( [facility])                                       Social Determinants of Health (SDOH) Interventions    Readmission Risk Interventions No flowsheet data found.  

## 2020-10-26 NOTE — Discharge Summary (Signed)
Physician Discharge Summary  Deanna Baldwin MEQ:[id number] DOB: Jun 17, 1975 DOA: 10/13/2020  PCP: [name], MD  Admit date: 10/13/2020 Discharge date: 10/26/2020  Admitted From: [facility] home Disposition:  [facility]  Recommendations for Outpatient Follow-up:  Follow up with PCP in 1-2 weeks Please obtain BMP/CBC in one week Continue wound care as instructed keep on air mattress bed.  Home Health:no  Equipment/Devices: none  Discharge Condition: Stable Code Status:   Code Status: Full Code Diet recommendation:  Diet Order             Diet - low sodium heart healthy           Diet regular Room service appropriate? Yes with Assist; Fluid consistency: Thin  Diet effective now                    Brief/Interim Summary: 89 old female with history of hypothyroidism, MS with contractures of her legs neuromuscular dysfunctions of bladder, status post urostomy, multiple pressure ulcers, with ambulatory dysfunction/sedentary, history of hypotension on midodrine, hypothyroidism from [place] sent to the ED for electrolyte abnormality Apparently patient was getting IV fluids  and also had change in mental status at the facility.   In the ED had leukocytosis severely elevated calcium level creatinine 3.0 EKG NSR, nephro was consulted and patient was admitted.  Managed with aggressive IV hydration calcitonin serial calcium monitoring.  PTH was appropriately suppressed, vitamin D urine calcium nl. Managed with calcitonin x48 hours, aggressive fluid and bisphosphonates x1 with resolution of hypercalcemia. Patient had fever spikes tachycardia soft blood pressure transfer to stepdown after placing on IV antibiotics- vancomycin Zosyn- underwent CT chest abdomen pelvis-no clear source.  Blood cultures were sent and negative so far-off vancomycin 9/20. Patient having hypocalcemia and being replaced. Had drop in hemoglobin to 6.9 g needing  1 unit PRBC-  anemia work-up stable but FOBT positive without evidence of active bleeding Seen by GI not a candidate for EGD. Patient has been doing well without antibiotics, getting wound care and supportive care. She has had soft blood pressure and sinus tachycardia which is her chronic baseline.  She will continue on her midodrine.  At this time she remains asymptomatic and medically stable for discharge back to her nursing home.  Sister will look into transferring her into different nursing home and will discuss with the facility.  Discharge Diagnoses:   Hypocalcemia suspecting overcorrection/2/2 bisphosphonates--I had discussed with nephrology Dr. Justin Mend- calcitriol 0.25x3 doses on  9/21 and multiple doses of IV calcium gluconate .  Calcium is stable.  Check BMP in the morning.    Severe hypercalcemia likely from volume contraction.  Status post calcitonin and bisphosphonates 9/15.  PTH suppressed urine calcium 25 hydroxy vitamin D normal.  Now having hyocalcemia.  Discontinued calcium supplementation and vitamin D supplements see below.   Hypomagnesemia  Hypophosphatemia AKI Mild metabolic acidosis  Hypernatremia Electrolytes improved renal function is stable.  Treated with IV fluid hydration   FUO on 9/16: No further recurrence.  No clear source of infection.Underwent CT chest abdomen pelvis unyielding, blood culture no growth so far.  Doing well without antibiotics.    Transaminitis: AST ALT has normalized ALP slightly up monitor   Chronic hypotension on midodrine at home dose 2.5. mg tid Sinus tachycardia: Asymptomatic.  Appears to have chronic tachycardia as well, with some worsening due to anemia.  Today remains hemodynamically stable   Hypothyroidism continue Synthroid check TSH in 4-week. TSH slightly up  at 5.8 this admission.   Moderate bilateral hydronephrosis/9 mm right lower pole calculus with dysfunctional bladder urostomy in place outpatient follow-up   Anemia of chronic disease with  drop in hemoglobin likely from hemodilution- but FOBT+ 9/22-no obvious bleeding noted.  Suspecting hemoconcentrated on admission in the setting of AKI.  S/p 1 unit prbc 9/22-h/h increased more than appropriately.  Seen by GI not a candidate for endoscopic evaluation . 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    Pressure ulcer left lateral foot unstageable, and on left hip stage II.  POA. Cont air matrress, cont wound care   Consults: Gastroenterology Nephrology  Subjective: Alert awake at baseline, not in distress.  Heart rate in 90s to low 100s blood pressure 100d Discharge Exam: Vitals:   10/26/20 0846 10/26/20 0900  BP: 122/71 99/71  Pulse:    Resp:  20  Temp:    SpO2:     General: Pt is alert, awake, not in acute distress Cardiovascular: RRR, S1/S2 +, no rubs, no gallops Respiratory: CTA bilaterally, no wheezing, no rhonchi Abdominal: Soft, NT, ND, bowel sounds +, urostomy in place Extremities: no edema, no cyanosis contractures in lower extremities,  Discharge Instructions  Discharge Instructions     Diet - low sodium heart healthy   Complete by: As directed    Discharge instructions   Complete by: As directed    Please call call MD or return to ER for similar or worsening recurring problem that brought you to hospital or if any fever,nausea/vomiting,abdominal pain, uncontrolled pain, chest pain,  shortness of breath or any other alarming symptoms.  check BMP and CBC in 1 week  Please follow-up your doctor as instructed in a week time and call the office for appointment.  Please avoid alcohol, smoking, or any other illicit  substance and maintain healthy habits including taking your regular medications as prescribed.  You were cared for by a hospitalist during your hospital stay. If you have any questions about your discharge medications or the care you received while you were in the hospital after you are discharged, you can call the unit and ask to speak with the hospitalist on call if the hospitalist that took care of you is not available.  Once you are discharged, your primary care physician will handle any further medical issues. Please note that NO REFILLS for any discharge medications will be authorized once you are discharged, as it is imperative that you return to your primary care physician (or establish a relationship with a primary care physician if you do not have one) for your aftercare needs so that they can reassess your need for medications and monitor your lab values   Discharge wound care:   Complete by: As directed    Clean the right trochanter wound with NS, pat dry with sterile gauze or allow to air dry then place a small piece of Aquacel Advantage Kellie Simmering # [phone]) over this wound and secure with foam dressing. Change Aquacel daily. Foam dressing may be changed every 3 days unless soiled. Place a foam dressing over the wounds on the inside of the knees and a sacral foam dressing upside  down on the coccyx area for protection. Place a foam dressing over the foot wound and change every 3 days or PRN soiling.    Monitor the wound area(s) for worsening of condition such as: Signs/symptoms of infection, increase in size, development of or worsening of odor, development of pain, or increased pain at the affected locations.   Notify the medical team if any of these develop.  Keep on your mattress bed to prevent bedsores   Increase activity slowly   Complete by: As directed       Allergies as of 10/26/2020       Reactions   Natalizumab         Medication List     STOP taking these medications     Calcium Carbonate-Vitamin D3 600-400 MG-UNIT Tabs   Vitamin D (Ergocalciferol) 1.25 MG (50000 UNIT) Caps capsule Commonly known as: DRISDOL       TAKE these medications    acetaminophen 325 MG tablet Commonly known as: TYLENOL Take 2 tablets (650 mg total) by mouth every 6 (six) hours as needed for mild pain (or Fever >/= 101).   baclofen 10 MG tablet Commonly known as: LIORESAL Take 10 mg by mouth 3 (three) times daily.   bisacodyl 5 MG EC tablet Commonly known as: DULCOLAX Take 5 mg by mouth daily.   Ensure Take 237 mLs by mouth. With meals. Vanilla   ProMod Liqd Protein Liquid - give 30 ml by mouth two times daily   NUTRITIONAL SUPPLEMENT PO Regular Diet - Regular Texture   ferrous sulfate 325 (65 FE) MG tablet Take 325 mg by mouth daily with breakfast.   ibuprofen 200 MG tablet Commonly known as: ADVIL Take 200 mg by mouth every 12 (twelve) hours as needed.   levothyroxine 100 MCG tablet Commonly known as: SYNTHROID Take 100 mcg by mouth daily before breakfast.   midodrine 2.5 MG tablet Commonly known as: PROAMATINE Take 1 tablet (2.5 mg total) by mouth 3 (three) times daily with meals.   multivitamin tablet Take 1 tablet by mouth daily.   omeprazole 20 MG capsule Commonly known as: PRILOSEC Take 20 mg by mouth daily.   ondansetron 4 MG tablet Commonly known as: ZOFRAN Take 4 mg by mouth every 6 (six) hours as needed for nausea or vomiting.   polyethylene glycol 17 g packet Commonly known as: MIRALAX / GLYCOLAX Take 17 g by mouth daily.   PROBIOTIC PO Take 1 tablet by mouth daily.   tizanidine 2 MG capsule Commonly known as: ZANAFLEX Take 2 mg by mouth 3 (three) times daily.   VITAMIN B-12 PO Take 500 mcg by mouth daily.   VITAMIN C PO Take 500 mg by mouth 2 (two) times daily.               Discharge Care Instructions  (From admission, onward)           Start     Ordered   10/26/20 0000  Discharge wound care:        Comments: Clean the right trochanter wound with NS, pat dry with sterile gauze or allow to air dry then place a small piece of Aquacel Advantage Kellie Simmering # [id number]) over this wound and secure with foam dressing. Change Aquacel daily. Foam dressing may be changed every 3 days unless soiled. Place a foam dressing over the wounds on the inside of the knees and a sacral foam dressing upside down on the coccyx area for protection. Place a foam  dressing over the foot wound and change every 3 days or PRN soiling.    Monitor the wound area(s) for worsening of condition such as: Signs/symptoms of infection, increase in size, development of or worsening of odor, development of pain, or increased pain at the affected locations.   Notify the medical team if any of these develop.  Keep on your mattress bed to prevent bedsores   10/26/20 1142            Allergies  Allergen Reactions   Natalizumab     The results of significant diagnostics from this hospitalization (including imaging, microbiology, ancillary and laboratory) are listed below for reference.    Microbiology: Recent Results (from the past 240 hour(s))  SARS CORONAVIRUS 2 (TAT 6-24 HRS) Nasopharyngeal Nasopharyngeal Swab     Status: None   Collection Time: 10/17/20  5:17 PM   Specimen: Nasopharyngeal Swab  Result Value Ref Range Status   SARS Coronavirus 2 NEGATIVE NEGATIVE Final    Comment: (NOTE) SARS-CoV-2 target nucleic acids are NOT DETECTED.  The SARS-CoV-2 RNA is generally detectable in upper and lower respiratory specimens during the acute phase of infection. Negative results do not preclude SARS-CoV-2 infection, do not rule out co-infections with other pathogens, and should not be used as the sole basis for treatment or other patient management decisions. Negative results must be combined with clinical observations, patient history, and epidemiological information. The expected result is Negative.  Fact Sheet for  Patients: SugarRoll.be  Fact Sheet for Healthcare Providers: https://www.woods-mathews.com/  This test is not yet approved or cleared by the [place] FDA and  has been authorized for detection and/or diagnosis of SARS-CoV-2 by FDA under an Emergency Use Authorization (EUA). This EUA will remain  in effect (meaning this test can be used) for the duration of the COVID-19 declaration under Se ction 564(b)(1) of the Act, 21 U.S.C. section 360bbb-3(b)(1), unless the authorization is terminated or revoked sooner.  Performed at [facility], [place] [address] [place], [place] [address]   Culture, blood (routine x 2)     Status: None   Collection Time: 10/18/20  2:54 AM   Specimen: BLOOD  Result Value Ref Range Status   Specimen Description BLOOD RIGHT ANTECUBITAL  Final   Special Requests   Final    BOTTLES DRAWN AEROBIC AND ANAEROBIC Blood Culture adequate volume   Culture   Final    NO GROWTH 5 DAYS Performed at [facility], [place] [address], [place], [place] [address]    Report Status 10/23/2020 FINAL  Final  Culture, blood (routine x 2)     Status: None   Collection Time: 10/18/20  2:54 AM   Specimen: BLOOD RIGHT HAND  Result Value Ref Range Status   Specimen Description BLOOD RIGHT HAND  Final   Special Requests   Final    BOTTLES DRAWN AEROBIC AND ANAEROBIC Blood Culture adequate volume   Culture   Final    NO GROWTH 5 DAYS Performed at [facility], [place] [address] [place], [place] [address]    Report Status 10/23/2020 FINAL  Final  Resp Panel by RT-PCR (Flu A&B, Covid) Nasopharyngeal Swab     Status: None   Collection Time: 10/18/20  9:20 AM   Specimen: Nasopharyngeal Swab; Nasopharyngeal(NP) swabs in vial transport medium  Result Value Ref Range Status   SARS Coronavirus 2 by RT PCR NEGATIVE NEGATIVE Final    Comment: (NOTE) SARS-CoV-2 target nucleic acids are NOT DETECTED.  The SARS-CoV-2 RNA is generally  detectable in upper respiratory specimens during the acute phase of infection. The lowest concentration of SARS-CoV-2 viral copies this assay can detect is 138 copies/mL. A negative result does not preclude SARS-Cov-2 infection and should not be used as the sole basis for treatment or other patient management decisions. A negative result may occur with  improper specimen collection/handling, submission of specimen other than nasopharyngeal swab, presence of viral mutation(s) within the areas targeted by this assay, and inadequate number of viral copies(<138 copies/mL). A negative result must be combined with clinical observations, patient history, and epidemiological information. The expected result is Negative.  Fact Sheet for Patients:  EntrepreneurPulse.com.au  Fact Sheet for Healthcare Providers:  IncredibleEmployment.be  This test is no t yet approved or cleared by the [place] FDA and  has been authorized for detection and/or diagnosis of SARS-CoV-2 by FDA under an Emergency Use Authorization (EUA). This EUA will remain  in effect (meaning this test can be used) for the duration of the COVID-19 declaration under Section 564(b)(1) of the Act, 21 U.S.C.section 360bbb-3(b)(1), unless the authorization is terminated  or revoked sooner.       Influenza A by PCR NEGATIVE NEGATIVE Final   Influenza B by PCR NEGATIVE NEGATIVE Final    Comment: (NOTE) The Xpert Xpress SARS-CoV-2/FLU/RSV plus assay is intended as an aid in the diagnosis of influenza from Nasopharyngeal swab specimens and should not be used as a sole basis for treatment. Nasal washings and aspirates are unacceptable for Xpert Xpress SARS-CoV-2/FLU/RSV testing.  Fact Sheet for Patients: EntrepreneurPulse.com.au  Fact Sheet for Healthcare Providers: IncredibleEmployment.be  This test is not yet approved or cleared by the [place] FDA  and has been authorized for detection and/or diagnosis of SARS-CoV-2 by FDA under an Emergency Use Authorization (EUA). This EUA will remain in effect (meaning this test can be used) for the duration of the COVID-19 declaration under Section 564(b)(1) of the Act, 21 U.S.C. section 360bbb-3(b)(1), unless the authorization is terminated or revoked.  Performed at [facility], [place] [address] [place], [place] [address]   SARS CORONAVIRUS 2 (TAT 6-24 HRS) Nasopharyngeal Nasopharyngeal Swab     Status: None   Collection Time: 10/24/20  4:51 PM   Specimen: Nasopharyngeal Swab  Result Value Ref Range Status   SARS Coronavirus 2 NEGATIVE NEGATIVE Final    Comment: (NOTE) SARS-CoV-2 target nucleic acids are NOT DETECTED.  The SARS-CoV-2 RNA is generally detectable in upper and lower respiratory specimens during the acute phase of infection. Negative results do not preclude SARS-CoV-2 infection, do not rule out co-infections with other pathogens, and should not be used as the sole basis for treatment or other patient management decisions. Negative results must be combined with clinical observations, patient history, and epidemiological information. The expected result is Negative.  Fact Sheet for Patients: SugarRoll.be  Fact Sheet for Healthcare Providers: https://www.woods-mathews.com/  This test is not yet approved or cleared by the [place] FDA and  has been authorized for detection and/or diagnosis of SARS-CoV-2 by FDA under an Emergency Use Authorization (EUA). This EUA will remain  in effect (meaning this test can be used) for the duration of the COVID-19 declaration under Se ction 564(b)(1) of the Act, 21 U.S.C. section 360bbb-3(b)(1), unless the authorization is terminated or revoked sooner.  Performed at [facility], [place] [address] [place], [place] [address]     Procedures/Studies: US RENAL  Result Date:  November 13, 2020 CLINICAL DATA:  Acute  kidney injury EXAM: RENAL / URINARY TRACT ULTRASOUND COMPLETE COMPARISON:  None. FINDINGS: Right Kidney: Renal measurements: 11.4 x 5.2 x 5.0 cm = volume: 153 mL. Increased parenchymal echogenicity. 9 mm lower pole calculus. Moderate hydronephrosis. Left Kidney: Renal measurements: 10.0 x 4.8 x 2.9 cm = volume: 74 mL. Poorly visualized. Increased parenchymal echogenicity. Moderate hydronephrosis. Bladder: Not visualized due to colostomy. Other: None. IMPRESSION: Moderate bilateral hydronephrosis. 9 mm right lower pole renal calculus. Increased parenchymal echogenicity, suggesting medical renal disease, with suspected left renal atrophy (although poorly visualized. Bladder not discretely visualized. Electronically Signed   By: Julian Hy M.D.   On: 10/14/2020 01:13   CT CHEST ABDOMEN PELVIS W CONTRAST  Result Date: 10/18/2020 CLINICAL DATA:  Sepsis EXAM: CT CHEST, ABDOMEN, AND PELVIS WITH CONTRAST TECHNIQUE: Multidetector CT imaging of the chest, abdomen and pelvis was performed following the standard protocol during bolus administration of intravenous contrast. CONTRAST:  49mL OMNIPAQUE IOHEXOL 300 MG/ML SOLN, additional oral enteric contrast COMPARISON:  CT chest, 08/02/2017 CT abdomen pelvis, 06/25/2008 FINDINGS: CT CHEST FINDINGS Cardiovascular: No significant vascular findings. Normal heart size. No pericardial effusion. Mediastinum/Nodes: No enlarged mediastinal, hilar, or axillary lymph nodes. Thyroid gland, trachea, and esophagus demonstrate no significant findings. Lungs/Pleura: Small bilateral pleural effusions. Dense fibrotic scarring, bronchiectasis, and or consolidation of the dependent left lower lobe (series 5, image 104). Musculoskeletal: No chest wall mass or suspicious bone lesions identified. CT ABDOMEN PELVIS FINDINGS Hepatobiliary: No solid liver abnormality is seen. No gallstones, gallbladder wall thickening, or biliary dilatation. Pancreas:  Unremarkable. No pancreatic ductal dilatation or surrounding inflammatory changes. Spleen: Normal in size without significant abnormality. Adrenals/Urinary Tract: Adrenal glands are unremarkable. Mild left, moderate right hydronephrosis and hydroureter, the distal ureters very poorly visualized but without obvious obstructing calculi or other lesion. Stomach/Bowel: Stomach is within normal limits. Appendix appears normal. No evidence of bowel wall thickening, distention, or inflammatory changes. Vascular/Lymphatic: No significant vascular findings are present. No enlarged abdominal or pelvic lymph nodes. Reproductive: No mass or other abnormality. Other: No abdominal wall hernia or abnormality. No abdominopelvic ascites. Musculoskeletal: Decubitus wounds of the sacrum (series 3, image 95), bilateral ischial rami (series 3, image 121) and overlying the left greater trochanter (series 3, image 122). There is underlying sclerosis without bony erosion. IMPRESSION: 1. Small bilateral pleural effusions. 2. Dense fibrotic scarring, bronchiectasis, and or consolidation of the dependent left lower lobe. Findings are consistent with acute and/or chronic sequelae of infection or aspiration. 3. Mild left, moderate right hydronephrosis and hydroureter, the distal ureters very poorly visualized but without obvious obstructing calculi or other lesion. 4. Decubitus wounds of the sacrum, bilateral ischial rami and overlying the left greater trochanter. There is underlying sclerosis without acute appearing bony erosion. MRI is the test of choice to assess for osteomyelitis if clinically suspected. Electronically Signed   By: Eddie Candle M.D.   On: 10/18/2020 15:06   DG CHEST PORT 1 VIEW  Result Date: 10/17/2020 CLINICAL DATA:  Fever. EXAM: PORTABLE CHEST 1 VIEW COMPARISON:  Chest x-ray 08/10/2017 CT chest 729 FINDINGS: The heart and mediastinal contours are within normal limits. No focal consolidation. No pulmonary edema. Query  trace left pleural effusion. No pneumothorax. No acute osseous abnormality. IMPRESSION: Query trace left pleural effusion. Electronically Signed   By: Iven Finn M.D.   On: 10/17/2020 22:19    Labs: BNP (last 3 results) No results for input(s): BNP in the last 8760 hours. Basic Metabolic Panel: Recent Labs  Lab 10/20/20 0117 10/20/20 1005 10/21/20  0145 10/21/20 1454 10/22/20 0132 10/23/20 0131 10/24/20 0706 10/25/20 0105  NA 141 142   < > 143 143 139 141 137  K 3.4* 3.8   < > 4.3 4.2 4.2 3.8 4.9  CL 109 109   < > 112* 114* 109 110 102  CO2 19* 18*   < > 19* 16* 21* 21* 22  GLUCOSE 100* 118*   < > 122* 88 114* 87 98  BUN 16 11   < > 12 13 10 15 14   CREATININE 1.23* 1.20*   < > 0.95 0.88 0.95 0.71 0.71  CALCIUM 5.7* 5.5*   < > 6.0* 6.0* 6.4* 7.6* 8.3*  MG 0.9* 1.7  --  1.1* 1.0* 1.8 1.2* 1.9  PHOS 4.6 5.0*  --   --  5.2*  --   --   --    < > = values in this interval not displayed.   Liver Function Tests: Recent Labs  Lab 10/21/20 0145 10/22/20 0132 10/23/20 0131 10/24/20 0706 10/25/20 0105  AST 82* 54* 41 17 25  ALT 61* 49* 40 28 33  ALKPHOS 183* 225* 275* 237* 263*  BILITOT 0.1* 0.5 0.3 0.3 0.8  PROT 5.4* 5.4* 5.7* 5.9* 6.9  ALBUMIN 2.1* 2.2* 2.1* 2.2* 2.6*   No results for input(s): LIPASE, AMYLASE in the last 168 hours. No results for input(s): AMMONIA in the last 168 hours. CBC: Recent Labs  Lab 10/20/20 0117 10/21/20 0145 10/22/20 0504 10/23/20 0131 10/24/20 0445 10/25/20 0601  WBC 6.8 8.9 9.1 7.4  --  8.0  HGB 7.6* 7.0* 7.9* 6.9* 8.9* 9.6*  HCT 23.4* 22.1* 24.7* 21.1* 26.3* 29.7*  MCV 91.8 92.1 92.5 91.3  --  90.3  PLT 267 321 399 381  --  553*   Cardiac Enzymes: No results for input(s): CKTOTAL, CKMB, CKMBINDEX, TROPONINI in the last 168 hours. BNP: Invalid input(s): POCBNP CBG: No results for input(s): GLUCAP in the last 168 hours. D-Dimer No results for input(s): DDIMER in the last 72 hours. Hgb A1c No results for input(s): HGBA1C in  the last 72 hours. Lipid Profile No results for input(s): CHOL, HDL, LDLCALC, TRIG, CHOLHDL, LDLDIRECT in the last 72 hours. Thyroid function studies No results for input(s): TSH, T4TOTAL, T3FREE, THYROIDAB in the last 72 hours.  Invalid input(s): FREET3 Anemia work up Recent Labs    10/23/20 1640  VITAMINB12 1,332*  FOLATE 7.6  FERRITIN 160  TIBC 207*  IRON 81  RETICCTPCT 1.0   Urinalysis    Component Value Date/Time   COLORURINE YELLOW 10/17/2020 2148   APPEARANCEUR HAZY (A) 10/17/2020 2148   LABSPEC 1.008 10/17/2020 2148   PHURINE 7.0 10/17/2020 2148   GLUCOSEU NEGATIVE 10/17/2020 2148   HGBUR SMALL (A) 10/17/2020 2148   BILIRUBINUR NEGATIVE 10/17/2020 2148   KETONESUR 5 (A) 10/17/2020 2148   PROTEINUR NEGATIVE 10/17/2020 2148   UROBILINOGEN 0.2 06/25/2008 1551   NITRITE POSITIVE (A) 10/17/2020 2148   LEUKOCYTESUR SMALL (A) 10/17/2020 2148   Sepsis Labs Invalid input(s): PROCALCITONIN,  WBC,  LACTICIDVEN Microbiology Recent Results (from the past 240 hour(s))  SARS CORONAVIRUS 2 (TAT 6-24 HRS) Nasopharyngeal Nasopharyngeal Swab     Status: None   Collection Time: 10/17/20  5:17 PM   Specimen: Nasopharyngeal Swab  Result Value Ref Range Status   SARS Coronavirus 2 NEGATIVE NEGATIVE Final    Comment: (NOTE) SARS-CoV-2 target nucleic acids are NOT DETECTED.  The SARS-CoV-2 RNA is generally detectable in upper and lower respiratory specimens during  the acute phase of infection. Negative results do not preclude SARS-CoV-2 infection, do not rule out co-infections with other pathogens, and should not be used as the sole basis for treatment or other patient management decisions. Negative results must be combined with clinical observations, patient history, and epidemiological information. The expected result is Negative.  Fact Sheet for Patients: SugarRoll.be  Fact Sheet for Healthcare  Providers: https://www.woods-mathews.com/  This test is not yet approved or cleared by the [place] FDA and  has been authorized for detection and/or diagnosis of SARS-CoV-2 by FDA under an Emergency Use Authorization (EUA). This EUA will remain  in effect (meaning this test can be used) for the duration of the COVID-19 declaration under Se ction 564(b)(1) of the Act, 21 U.S.C. section 360bbb-3(b)(1), unless the authorization is terminated or revoked sooner.  Performed at [facility], [place] [address], [place], [place] [address]   Culture, blood (routine x 2)     Status: None   Collection Time: 10/18/20  2:54 AM   Specimen: BLOOD  Result Value Ref Range Status   Specimen Description BLOOD RIGHT ANTECUBITAL  Final   Special Requests   Final    BOTTLES DRAWN AEROBIC AND ANAEROBIC Blood Culture adequate volume   Culture   Final    NO GROWTH 5 DAYS Performed at [facility], [place] [address], [place], [place] [address]    Report Status 10/23/2020 FINAL  Final  Culture, blood (routine x 2)     Status: None   Collection Time: 10/18/20  2:54 AM   Specimen: BLOOD RIGHT HAND  Result Value Ref Range Status   Specimen Description BLOOD RIGHT HAND  Final   Special Requests   Final    BOTTLES DRAWN AEROBIC AND ANAEROBIC Blood Culture adequate volume   Culture   Final    NO GROWTH 5 DAYS Performed at [facility], [place] [address] [place], [place] [address]    Report Status 10/23/2020 FINAL  Final  Resp Panel by RT-PCR (Flu A&B, Covid) Nasopharyngeal Swab     Status: None   Collection Time: 10/18/20  9:20 AM   Specimen: Nasopharyngeal Swab; Nasopharyngeal(NP) swabs in vial transport medium  Result Value Ref Range Status   SARS Coronavirus 2 by RT PCR NEGATIVE NEGATIVE Final    Comment: (NOTE) SARS-CoV-2 target nucleic acids are NOT DETECTED.  The SARS-CoV-2 RNA is generally detectable in upper respiratory specimens during the acute phase of infection.  The lowest concentration of SARS-CoV-2 viral copies this assay can detect is 138 copies/mL. A negative result does not preclude SARS-Cov-2 infection and should not be used as the sole basis for treatment or other patient management decisions. A negative result may occur with  improper specimen collection/handling, submission of specimen other than nasopharyngeal swab, presence of viral mutation(s) within the areas targeted by this assay, and inadequate number of viral copies(<138 copies/mL). A negative result must be combined with clinical observations, patient history, and epidemiological information. The expected result is Negative.  Fact Sheet for Patients:  EntrepreneurPulse.com.au  Fact Sheet for Healthcare Providers:  IncredibleEmployment.be  This test is no t yet approved or cleared by the [place] FDA and  has been authorized for detection and/or diagnosis of SARS-CoV-2 by FDA under an Emergency Use Authorization (EUA). This EUA will remain  in effect (meaning this test can be used) for the duration of the COVID-19 declaration under Section 564(b)(1) of the Act, 21 U.S.C.section 360bbb-3(b)(1), unless the authorization is terminated  or revoked sooner.       Influenza A by PCR NEGATIVE NEGATIVE Final   Influenza B by PCR NEGATIVE NEGATIVE Final    Comment: (NOTE) The Xpert Xpress SARS-CoV-2/FLU/RSV plus assay is intended as an aid in the diagnosis of influenza from Nasopharyngeal swab specimens and should not be used as a sole basis for treatment. Nasal washings and aspirates are unacceptable for Xpert Xpress SARS-CoV-2/FLU/RSV testing.  Fact Sheet for Patients: EntrepreneurPulse.com.au  Fact Sheet for Healthcare Providers: IncredibleEmployment.be  This test is not yet approved or cleared by the [place] FDA and has been authorized for detection and/or diagnosis of SARS-CoV-2 by FDA  under an Emergency Use Authorization (EUA). This EUA will remain in effect (meaning this test can be used) for the duration of the COVID-19 declaration under Section 564(b)(1) of the Act, 21 U.S.C. section 360bbb-3(b)(1), unless the authorization is terminated or revoked.  Performed at [facility], [place] [address] [place], [place] [address]   SARS CORONAVIRUS 2 (TAT 6-24 HRS) Nasopharyngeal Nasopharyngeal Swab     Status: None   Collection Time: 10/24/20  4:51 PM   Specimen: Nasopharyngeal Swab  Result Value Ref Range Status   SARS Coronavirus 2 NEGATIVE NEGATIVE Final    Comment: (NOTE) SARS-CoV-2 target nucleic acids are NOT DETECTED.  The SARS-CoV-2 RNA is generally detectable in upper and lower respiratory specimens during the acute phase of infection. Negative results do not preclude SARS-CoV-2 infection, do not rule out co-infections with other pathogens, and should not be used as the sole basis for treatment or other patient management decisions. Negative results must be combined with clinical observations, patient history, and epidemiological information. The expected result is Negative.  Fact Sheet for Patients: SugarRoll.be  Fact Sheet for Healthcare Providers: https://www.woods-mathews.com/  This test is not yet approved or cleared by the [place] FDA and  has been authorized for detection and/or diagnosis of SARS-CoV-2 by FDA under an Emergency Use Authorization (EUA). This EUA will remain  in effect (meaning this test can be used) for the duration of the COVID-19 declaration under Se ction 564(b)(1) of the Act, 21 U.S.C. section 360bbb-3(b)(1), unless the authorization is terminated or revoked sooner.  Performed at [facility], [place] [address] [place], [place] [address]      Time coordinating discharge: 35 minutes  SIGNED: Antonieta Pert, MD  [facility] 10/26/2020, 11:54 AM  If 7PM-7AM,  please contact night-coverage [email or web address]

## 2020-11-06 ENCOUNTER — Other Ambulatory Visit: Payer: Self-pay

## 2020-11-06 ENCOUNTER — Encounter (HOSPITAL_COMMUNITY): Payer: Self-pay

## 2020-11-06 ENCOUNTER — Inpatient Hospital Stay (HOSPITAL_COMMUNITY)
Admission: EM | Admit: 2020-11-06 | Discharge: 2020-11-12 | DRG: 640 | Disposition: A | Discharge status: Skilled Nursing Facility | Payer: Medicare (Managed Care) | Source: Skilled Nursing Facility | Attending: Internal Medicine | Admitting: Internal Medicine

## 2020-11-06 DIAGNOSIS — Z23 Encounter for immunization: Secondary | ICD-10-CM

## 2020-11-06 DIAGNOSIS — E43 Unspecified severe protein-calorie malnutrition: Secondary | ICD-10-CM | POA: Diagnosis present

## 2020-11-06 DIAGNOSIS — G35 Multiple sclerosis: Secondary | ICD-10-CM | POA: Diagnosis present

## 2020-11-06 DIAGNOSIS — I959 Hypotension, unspecified: Secondary | ICD-10-CM | POA: Diagnosis present

## 2020-11-06 DIAGNOSIS — Z79890 Hormone replacement therapy (postmenopausal): Secondary | ICD-10-CM

## 2020-11-06 DIAGNOSIS — G9341 Metabolic encephalopathy: Secondary | ICD-10-CM | POA: Diagnosis present

## 2020-11-06 DIAGNOSIS — G894 Chronic pain syndrome: Secondary | ICD-10-CM | POA: Diagnosis not present

## 2020-11-06 DIAGNOSIS — Z87891 Personal history of nicotine dependence: Secondary | ICD-10-CM

## 2020-11-06 DIAGNOSIS — D638 Anemia in other chronic diseases classified elsewhere: Secondary | ICD-10-CM | POA: Diagnosis present

## 2020-11-06 DIAGNOSIS — Z20822 Contact with and (suspected) exposure to covid-19: Secondary | ICD-10-CM | POA: Diagnosis present

## 2020-11-06 DIAGNOSIS — Z888 Allergy status to other drugs, medicaments and biological substances status: Secondary | ICD-10-CM

## 2020-11-06 DIAGNOSIS — Z79899 Other long term (current) drug therapy: Secondary | ICD-10-CM

## 2020-11-06 DIAGNOSIS — N319 Neuromuscular dysfunction of bladder, unspecified: Secondary | ICD-10-CM | POA: Diagnosis present

## 2020-11-06 DIAGNOSIS — E46 Unspecified protein-calorie malnutrition: Secondary | ICD-10-CM | POA: Diagnosis present

## 2020-11-06 DIAGNOSIS — Z818 Family history of other mental and behavioral disorders: Secondary | ICD-10-CM

## 2020-11-06 DIAGNOSIS — K5909 Other constipation: Secondary | ICD-10-CM | POA: Diagnosis not present

## 2020-11-06 DIAGNOSIS — M2450 Contracture, unspecified joint: Secondary | ICD-10-CM | POA: Diagnosis present

## 2020-11-06 DIAGNOSIS — R64 Cachexia: Secondary | ICD-10-CM | POA: Diagnosis present

## 2020-11-06 DIAGNOSIS — G8929 Other chronic pain: Secondary | ICD-10-CM | POA: Diagnosis present

## 2020-11-06 DIAGNOSIS — G8220 Paraplegia, unspecified: Secondary | ICD-10-CM | POA: Diagnosis present

## 2020-11-06 DIAGNOSIS — Z66 Do not resuscitate: Secondary | ICD-10-CM | POA: Diagnosis not present

## 2020-11-06 DIAGNOSIS — Z833 Family history of diabetes mellitus: Secondary | ICD-10-CM

## 2020-11-06 DIAGNOSIS — Z993 Dependence on wheelchair: Secondary | ICD-10-CM

## 2020-11-06 DIAGNOSIS — E872 Acidosis, unspecified: Secondary | ICD-10-CM | POA: Diagnosis present

## 2020-11-06 DIAGNOSIS — R627 Adult failure to thrive: Secondary | ICD-10-CM

## 2020-11-06 DIAGNOSIS — Z681 Body mass index (BMI) 19 or less, adult: Secondary | ICD-10-CM

## 2020-11-06 DIAGNOSIS — E039 Hypothyroidism, unspecified: Secondary | ICD-10-CM | POA: Diagnosis present

## 2020-11-06 DIAGNOSIS — Z936 Other artificial openings of urinary tract status: Secondary | ICD-10-CM

## 2020-11-06 DIAGNOSIS — L89150 Pressure ulcer of sacral region, unstageable: Secondary | ICD-10-CM

## 2020-11-06 DIAGNOSIS — Z7189 Other specified counseling: Secondary | ICD-10-CM

## 2020-11-06 LAB — COMPREHENSIVE METABOLIC PANEL
ALT: 10 U/L (ref 0–44)
AST: 19 U/L (ref 15–41)
Albumin: 3 g/dL — ABNORMAL LOW (ref 3.5–5.0)
Alkaline Phosphatase: 142 U/L — ABNORMAL HIGH (ref 38–126)
Anion gap: 16 — ABNORMAL HIGH (ref 5–15)
BUN: 19 mg/dL (ref 6–20)
CO2: 22 mmol/L (ref 22–32)
Calcium: 4.1 mg/dL — CL (ref 8.9–10.3)
Chloride: 106 mmol/L (ref 98–111)
Creatinine, Ser: 0.6 mg/dL (ref 0.44–1.00)
GFR, Estimated: >60 mL/min (ref >60)
Glucose, Bld: 99 mg/dL (ref 70–99)
Potassium: 3.6 mmol/L (ref 3.5–5.1)
Sodium: 144 mmol/L (ref 135–145)
Total Bilirubin: 0.2 mg/dL — ABNORMAL LOW (ref 0.3–1.2)
Total Protein: 7.8 g/dL (ref 6.5–8.1)

## 2020-11-06 LAB — CBC WITH DIFFERENTIAL/PLATELET
Abs Immature Granulocytes: 0.11 10*3/uL — ABNORMAL HIGH (ref 0.00–0.07)
Basophils Absolute: 0.1 10*3/uL (ref 0.0–0.1)
Basophils Relative: 1 %
Eosinophils Absolute: 0.2 10*3/uL (ref 0.0–0.5)
Eosinophils Relative: 3 %
HCT: 33.6 % — ABNORMAL LOW (ref 36.0–46.0)
Hemoglobin: 10.3 g/dL — ABNORMAL LOW (ref 12.0–15.0)
Immature Granulocytes: 1 %
Lymphocytes Relative: 9 %
Lymphs Abs: 0.9 10*3/uL (ref 0.7–4.0)
MCH: 28.9 pg (ref 26.0–34.0)
MCHC: 30.7 g/dL (ref 30.0–36.0)
MCV: 94.1 fL (ref 80.0–100.0)
Monocytes Absolute: 0.5 10*3/uL (ref 0.1–1.0)
Monocytes Relative: 6 %
Neutro Abs: 7.6 10*3/uL (ref 1.7–7.7)
Neutrophils Relative %: 80 %
Platelets: 653 10*3/uL — ABNORMAL HIGH (ref 150–400)
RBC: 3.57 MIL/uL — ABNORMAL LOW (ref 3.87–5.11)
RDW: 14.4 % (ref 11.5–15.5)
WBC: 9.5 10*3/uL (ref 4.0–10.5)
nRBC: 0 % (ref 0.0–0.2)

## 2020-11-06 LAB — I-STAT CHEM 8, ED
BUN: 20 mg/dL (ref 6–20)
Calcium, Ion: 0.38 mmol/L — CL (ref 1.15–1.40)
Chloride: 110 mmol/L (ref 98–111)
Creatinine, Ser: 0.5 mg/dL (ref 0.44–1.00)
Glucose, Bld: 103 mg/dL — ABNORMAL HIGH (ref 70–99)
HCT: 38 % (ref 36.0–46.0)
Hemoglobin: 12.9 g/dL (ref 12.0–15.0)
Potassium: 4 mmol/L (ref 3.5–5.1)
Sodium: 142 mmol/L (ref 135–145)
TCO2: 20 mmol/L — ABNORMAL LOW (ref 22–32)

## 2020-11-06 LAB — PHOSPHORUS: Phosphorus: 8.3 mg/dL — ABNORMAL HIGH (ref 2.5–4.6)

## 2020-11-06 LAB — TSH: TSH: 3.839 u[IU]/mL (ref 0.350–4.500)

## 2020-11-06 LAB — MAGNESIUM: Magnesium: 1.1 mg/dL — ABNORMAL LOW (ref 1.7–2.4)

## 2020-11-06 LAB — T4, FREE: Free T4: 1.22 ng/dL — ABNORMAL HIGH (ref 0.61–1.12)

## 2020-11-06 MED ORDER — CALCIUM GLUCONATE-NACL 1-0.675 GM/50ML-% IV SOLN
1.0000 g | Freq: Once | INTRAVENOUS | Status: AC
  Administered 2020-11-06: 1000 mg via INTRAVENOUS
  Filled 2020-11-06: qty 50

## 2020-11-06 MED ORDER — SODIUM CHLORIDE 0.9 % IV BOLUS
1000.0000 mL | Freq: Once | INTRAVENOUS | Status: AC
  Administered 2020-11-06: 1000 mL via INTRAVENOUS

## 2020-11-06 MED ORDER — MAGNESIUM SULFATE 2 GM/50ML IV SOLN
2.0000 g | Freq: Once | INTRAVENOUS | Status: AC
  Administered 2020-11-06: 2 g via INTRAVENOUS
  Filled 2020-11-06: qty 50

## 2020-11-06 NOTE — Assessment & Plan Note (Signed)
Chronic.  Patient has urostomy bag.

## 2020-11-06 NOTE — Assessment & Plan Note (Signed)
Admit to observation medical bed.  Patient received her dose of pamidronate on 10/16/2020 due to elevated calcium levels noted on her labs from 10-14-2020.  It appears patient had good response to pamidronate as she became quite hypocalcemic afterwards.  Patient was ultimately discharged back to nursing home on 10/26/2020.  Given the long half-life of pamidronate, I suspect the patient need several weeks to months of treatment with oral calcium and Rocaltrol in order to stabilize her calcium levels.  Patient may need outpatient endocrinology follow-up as well.

## 2020-11-06 NOTE — Assessment & Plan Note (Signed)
Present on admission.  Patient's had multiple decubitus ulcers and is had several episodes of osteomyelitis.  Currently her wounds are unstageable.

## 2020-11-06 NOTE — Assessment & Plan Note (Signed)
Chronic. 

## 2020-11-06 NOTE — Assessment & Plan Note (Signed)
Chronic.  Patient states that she was diagnosed with MS when she was 45 years old.  She has been at [facility] since 2018.  She has multiple flexion contractures of her lower extremities.

## 2020-11-06 NOTE — ED Triage Notes (Signed)
Pt bib [facility] from [place] with abnormal labs(Ca 4.4). Hr 93, BP 100/64, O2 98% RA, RR 18.

## 2020-11-06 NOTE — ED Notes (Signed)
Pt arrives soiled of urine and stool. RN changed brief and cleaned pt. Pt has multiple pressure sores on buttocks, hips, labia, and bilateral heels. New dressings placed. Pt's urostomy bag is soiled, leaking around it. Called materials for new bag.

## 2020-11-06 NOTE — Assessment & Plan Note (Signed)
Stable

## 2020-11-06 NOTE — Assessment & Plan Note (Signed)
Chronic.  Patient states that she has a "high metabolism".  She states that she can eat all day and not gain any weight.

## 2020-11-06 NOTE — H&P (Signed)
History and Physical    Deanna Baldwin VQQ:[id number] DOB: 12-27-1975 DOA: 11/06/2020  PCP: [name], MD   Patient coming from: [place]  I have personally briefly reviewed patient's old medical records in [facility]  CC: hypocalcemia HPI: 45 year old African-American female with a history of severe MS, spastic paraplegia secondary MS, multiple decubitus ulcers over sacral area, protein calorie malnutrition, hypothyroidism was sent to the ER today by the nursing home due to persistently low calcium levels on outpatient labs.  Patient been admitted earlier in September 2022 due to hypercalcemia with serum calcium score than 15.  She received a dose of 60 mg of pamidronate on 10-16-2020.  Subsequently she had several episodes of hypocalcemia while still in the hospital.  She had to be treated with multiple doses of IV calcium.  Patient states that she does not feel bad at all.  She has not had any unusual spasms.  She does have a history of MS so she is quite used to having's spasms.  She is on several medications to help with spasms including baclofen and Zanaflex.  Patient denies any palpitations, chest pain or shortness of breath. On arrival to the ER, temperature was 98.1 heart rate 98 respirations 19 blood pressure 103/74.  Labs showed a serum calcium of 4.1 with albumin 3.0.  Magnesium level was 1.1.  Phosphorus was elevated at 8.3.  Ionized calcium was low at 0.38.  Patient given 1 g of IV calcium gluconate 2 g of IV magnesium.  Due to the patient's persistently low calcium, [facility] contacted for admission.   ED Course: noted to have hypocalcemia. Given IV calcium gluconate and IV magnesium.  Review of Systems:  Review of Systems  Constitutional: Negative.  Negative for chills, fever and malaise/fatigue.  HENT: Negative.    Eyes: Negative.   Respiratory: Negative.    Cardiovascular:  Negative for chest pain and palpitations.   Gastrointestinal: Negative.   Genitourinary: Negative.   Musculoskeletal:  Positive for joint pain.  Skin: Negative.   Neurological: Negative.   Endo/Heme/Allergies:        Doesn't gain any weight despite how much she eats  Psychiatric/Behavioral: Negative.    All other systems reviewed and are negative.  Past Medical History:  Diagnosis Date  . Buttock wound 03/03/2016  . Dysrhythmia    tachycardia  . Hypercalcemia 10/14/2020  . MS (multiple sclerosis) ([place])   . Neutropenia ([place])   . Pneumonia 02/2016  . Protein calorie malnutrition ([place])   . Sacral osteomyelitis ([place]) 05/19/2017  . Severe sepsis ([place]) 03/03/2016  . 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 UTI (urinary tract infection) 02/2016    Past Surgical History:  Procedure Laterality Date  . DIVERTING ILEOSTOMY N/A 09/03/2016   Procedure: ILEAL CONDUIT  URINARY DIVERSION OPEN;  Surgeon: Ardis Hughs, MD;  Location: [facility];  Service: Urology;  Laterality: N/A;     reports that she quit smoking about 10 years ago. Her smoking use included cigarettes. She has a 10.00 pack-year smoking history. She has never used smokeless tobacco. She reports that she does not drink alcohol and does not use drugs.  Allergies  Allergen Reactions  . Natalizumab Other (See Comments)    Allergy not noted on MAR    Family History  Problem Relation Age of Onset  . Diabetes Mellitus I Mother   . Mental illness Sister     Prior to Admission medications   Medication Sig Start Date  End Date Taking? Authorizing Provider  acetaminophen (TYLENOL) 325 MG tablet Take 2 tablets (650 mg total) by mouth every 6 (six) hours as needed for mild pain (or Fever >/= 101). 04/11/17  Yes Eugenie Filler, MD  ascorbic acid (VITAMIN C) 500 MG tablet Take 500 mg by mouth in the morning.   Yes [provider]  baclofen (LIORESAL) 10 MG tablet Take 10 mg by mouth 3 (three) times daily.   Yes [provider]   bisacodyl (DULCOLAX) 5 MG EC tablet Take 5 mg by mouth daily.   Yes [provider]  CALCIUM 600-D 600-400 MG-UNIT TABS Take 2 tablets by mouth 3 (three) times daily.   Yes [provider]  Cyanocobalamin (VITAMIN B-12 PO) Take 500 mcg by mouth daily.   Yes [provider]  ENSURE (ENSURE) Take 120 mLs by mouth 3 (three) times daily between meals. With meals. Vanilla   Yes [provider]  ferrous sulfate 325 (65 FE) MG tablet Take 325 mg by mouth daily with breakfast.   Yes [provider]  ibuprofen (ADVIL,MOTRIN) 200 MG tablet Take 200 mg by mouth every 12 (twelve) hours as needed (for pain). 04/25/17  Yes [provider]  KEPPRA 500 MG tablet Take 500 mg by mouth in the morning and at bedtime.   Yes [provider]  levothyroxine (SYNTHROID) 100 MCG tablet Take 100 mcg by mouth daily before breakfast.   Yes [provider]  magnesium hydroxide (MILK OF MAGNESIA) 400 MG/5ML suspension Take 30 mLs by mouth every 12 (twelve) hours as needed for mild constipation.   Yes [provider]  midodrine (PROAMATINE) 2.5 MG tablet Take 1 tablet (2.5 mg total) by mouth 3 (three) times daily with meals. 09/10/16  Yes Elgergawy, Silver Huguenin, MD  Multiple Vitamin (MULTIVITAMIN) tablet Take 1 tablet by mouth daily. 06/13/17  Yes [provider]  NON FORMULARY Take 120 mLs by mouth See admin instructions. MedPass shake- Drink 120 ml's by mouth three times a day   Yes [provider]  omeprazole (PRILOSEC) 20 MG capsule Take 20 mg by mouth in the morning.   Yes [provider]  ondansetron (ZOFRAN) 4 MG tablet Take 4 mg by mouth every 6 (six) hours as needed for nausea or vomiting.   Yes [provider]  polyethylene glycol (MIRALAX / GLYCOLAX) packet Take 17 g by mouth See admin instructions. Mix 17 grams of powder into 4-8 ounces of water and drink every morning   Yes [provider]  Probiotic  Product (PROBIOTIC PO) Take 1 tablet by mouth daily.   Yes [provider]  PROTEIN PO Take 30 mLs by mouth 2 (two) times daily.   Yes [provider]  SANTYL ointment Apply 1 application topically See admin instructions. Apply to right heel topically every day shift- clean with Dakin's, apply Santyl, and cover with normal saline gauze with foam dressing   Yes [provider]  tiZANidine (ZANAFLEX) 2 MG tablet Take 2 mg by mouth 3 (three) times daily.   Yes [provider]  Nutritional Supplements (NUTRITIONAL SUPPLEMENT PO) Regular Diet - Regular Texture Patient not taking: Reported on 11/06/2020    [provider]  Nutritional Supplements (PROMOD) LIQD Protein Liquid - give 30 ml by mouth two times daily Patient not taking: Reported on 11/06/2020    [provider]    Physical Exam: Vitals:   11/06/20 1830 11/06/20 1845 11/06/20 1900 11/06/20 1915  BP: 99/68  111/76 111/75 106/70  Pulse: 87 87 88 88  Resp: 15 17 14 19   Temp:      TempSrc:      SpO2: 98% 100% 100% 98%    Physical Exam Vitals and nursing note reviewed.  Constitutional:      General: She is not in acute distress.    Appearance: She is not ill-appearing, toxic-appearing or diaphoretic.     Comments: Chronically ill appearing, cachetic female. underweight  HENT:     Head: Normocephalic and atraumatic.     Nose: Nose normal. No rhinorrhea.  Eyes:     General:        Right eye: No discharge.        Left eye: No discharge.  Cardiovascular:     Rate and Rhythm: Normal rate and regular rhythm.     Pulses: Normal pulses.  Pulmonary:     Effort: Pulmonary effort is normal. No respiratory distress.     Breath sounds: No wheezing.  Abdominal:     General: Bowel sounds are normal. There is no distension.     Palpations: Abdomen is soft.     Tenderness: There is no abdominal tenderness. There is no guarding.     Comments: Urostomy in RLQ  Musculoskeletal:      Comments: Multiple flexion contractures of hips, knees, ankles  Skin:    Capillary Refill: Capillary refill takes less than 2 seconds.  Neurological:     Mental Status: She is alert and oriented to person, place, and time.     Labs on Admission: I have personally reviewed following labs and imaging studies  CBC: Recent Labs  Lab 11/06/20 1700 11/06/20 1707  WBC  --  9.5  NEUTROABS  --  7.6  HGB 12.9 10.3*  HCT 38.0 33.6*  MCV  --  94.1  PLT  --  476*   Basic Metabolic Panel: Recent Labs  Lab 11/06/20 1700 11/06/20 1707  NA 142 144  K 4.0 3.6  CL 110 106  CO2  --  22  GLUCOSE 103* 99  BUN 20 19  CREATININE 0.50 0.60  CALCIUM  --  4.1*  MG  --  1.1*  PHOS  --  8.3*   GFR: Estimated Creatinine Clearance: 60.5 mL/min (by C-G formula based on SCr of 0.6 mg/dL). Liver Function Tests: Recent Labs  Lab 11/06/20 1707  AST 19  ALT 10  ALKPHOS 142*  BILITOT 0.2*  PROT 7.8  ALBUMIN 3.0*   No results for input(s): LIPASE, AMYLASE in the last 168 hours. No results for input(s): AMMONIA in the last 168 hours. Coagulation Profile: No results for input(s): INR, PROTIME in the last 168 hours. Cardiac Enzymes: No results for input(s): CKTOTAL, CKMB, CKMBINDEX, TROPONINI in the last 168 hours. BNP (last 3 results) No results for input(s): PROBNP in the last 8760 hours. HbA1C: No results for input(s): HGBA1C in the last 72 hours. CBG: No results for input(s): GLUCAP in the last 168 hours. Lipid Profile: No results for input(s): CHOL, HDL, LDLCALC, TRIG, CHOLHDL, LDLDIRECT in the last 72 hours. Thyroid Function Tests: Recent Labs    11/06/20 1707  TSH 3.839  FREET4 1.22*   Anemia Panel: No results for input(s): VITAMINB12, FOLATE, FERRITIN, TIBC, IRON, RETICCTPCT in the last 72 hours. Urine analysis:    Component Value Date/Time   COLORURINE YELLOW 10/17/2020 2148   APPEARANCEUR HAZY (A) 10/17/2020 2148   LABSPEC 1.008 10/17/2020 2148   PHURINE 7.0 10/17/2020  2148  GLUCOSEU NEGATIVE 10/17/2020 2148   HGBUR SMALL (A) 10/17/2020 2148   BILIRUBINUR NEGATIVE 10/17/2020 2148   KETONESUR 5 (A) 10/17/2020 2148   PROTEINUR NEGATIVE 10/17/2020 2148   UROBILINOGEN 0.2 06/25/2008 1551   NITRITE POSITIVE (A) 10/17/2020 2148   LEUKOCYTESUR SMALL (A) 10/17/2020 2148    Radiological Exams on Admission: I have personally reviewed images No results found.  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  Patient received her dose of pamidronate on 10/16/2020 due to elevated calcium levels noted on her labs from 10-14-2020.  It appears patient had good response to pamidronate as she became quite hypocalcemic afterwards.  Patient was ultimately discharged back to nursing home on 10/26/2020.  Given the long half-life of pamidronate, I suspect the patient need several weeks to months of treatment with oral calcium and Rocaltrol in order to stabilize her calcium levels.  Patient may need outpatient endocrinology follow-up as well.  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  Patient states that she was diagnosed with MS when she was 45 years old.  She has been at [facility] home since 2018.  She has multiple flexion contractures of her lower extremities.  Decubitus ulcer of sacral region, unstageable [facility]) Present on admission.  Patient's had multiple decubitus ulcers and is had several episodes of osteomyelitis.  Currently her wounds are unstageable.  Protein-calorie malnutrition, severe (HCC) Chronic.  Patient states that she  has a "high metabolism".  She states that she can eat all day and not gain any weight.  Neurogenic bladder Chronic.  Patient has urostomy bag.  Chronic pain Chronic.  Chronic constipation Chronic.  Hypothyroidism Stable.  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HCC) Chronic.  DVT prophylaxis: SCDs Code Status: Full Code Family Communication: no family at bedside  Disposition Plan: return to SNF  Consults called: none  Admission status: Observation, Med-Surg   Kristopher Oppenheim, DO [facility] 11/06/2020, 7:43 PM

## 2020-11-06 NOTE — ED Provider Notes (Signed)
[facility] EMERGENCY DEPARTMENT Provider Note   CSN: [id number] Arrival date & time: 11/06/20  1525     History Chief Complaint  Patient presents with   abnormal labs    Deanna Baldwin is a 45 y.o. female.  45 yo F with a chief complaints of hypocalcemia.  This was noted in lab work that was done today.  Patient was actually just in the hospital for hypercalcemia and started on therapy for that was discharged just last week.  Patient has no complaints.  The history is provided by the patient.  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[place])    UTI (urinary tract infection) 02/2016    Patient Active Problem List   Diagnosis Date Noted   Pressure injury of skin 10/18/2020   Hypercalcemia 10/14/2020   Multiple sclerosis ([place]) 05/06/2020   Palliative care encounter 03/08/2018   Atelectasis    HCAP (healthcare-associated pneumonia) 08/02/2017   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   Hypothyroidism 04/09/2017   Hypocalcemia 04/08/2017   Anemia 04/08/2017   Osteomyelitis ([place]) 03/19/2017   Altered mental status    Dehydration    Acute osteomyelitis of left pelvic region and thigh ([place])    SIRS (systemic inflammatory response syndrome) ([place]) 03/17/2017   Paraplegia ([place]) 03/09/2017   Chronic constipation 02/13/2017    Elevated liver enzymes 02/13/2017   Chronic pain 12/13/2016   Leukocytosis 09/15/2016   Hypotension 08/31/2016   Vitamin B 12 deficiency 07/09/2016   Pressure ulcer, 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/08/2016   Sepsis ([place]) 07/07/2016   Unintentional weight loss 07/06/2016   Relapsing remitting multiple sclerosis ([place]) 06/08/2016   Neurogenic bladder 04/09/2016   Tachycardia 04/08/2016   Anemia of chronic disease 04/06/2016   Dysphagia, oral phase 03/17/2016   Palliative care by specialist    Protein-calorie malnutrition, severe ([place])    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     Family History  Problem Relation Age of Onset   Diabetes Mellitus I Mother    Mental illness Sister     Social History   Tobacco Use   Smoking status: Former    Packs/day: 1.00    Years: 10.00    Pack years: 10.00    Types: Cigarettes    Quit date: 02/01/2010    Years since quitting: 10.7   Smokeless tobacco: Never  Vaping Use   Vaping Use: Never used  Substance Use Topics  Alcohol use: No   Drug use: No    Home Medications Prior to Admission medications   Medication Sig Start Date End Date Taking? Authorizing Provider  acetaminophen (TYLENOL) 325 MG tablet Take 2 tablets (650 mg total) by mouth every 6 (six) hours as needed for mild pain (or Fever >/= 101). 04/11/17  Yes Eugenie Filler, MD  ascorbic acid (VITAMIN C) 500 MG tablet Take 500 mg by mouth in the morning.   Yes [provider]  baclofen (LIORESAL) 10 MG tablet Take 10 mg by mouth 3 (three) times daily.   Yes [provider]   bisacodyl (DULCOLAX) 5 MG EC tablet Take 5 mg by mouth daily.   Yes [provider]  CALCIUM 600-D 600-400 MG-UNIT TABS Take 2 tablets by mouth 3 (three) times daily.   Yes [provider]  Cyanocobalamin (VITAMIN B-12 PO) Take 500 mcg by mouth daily.   Yes [provider]  ENSURE (ENSURE) Take 120 mLs by mouth 3 (three) times daily between meals. With meals. Vanilla   Yes [provider]  ferrous sulfate 325 (65 FE) MG tablet Take 325 mg by mouth daily with breakfast.   Yes [provider]  ibuprofen (ADVIL,MOTRIN) 200 MG tablet Take 200 mg by mouth every 12 (twelve) hours as needed (for pain). 04/25/17  Yes [provider]  KEPPRA 500 MG tablet Take 500 mg by mouth in the morning and at bedtime.   Yes [provider]  levothyroxine (SYNTHROID) 100 MCG tablet Take 100 mcg by mouth daily before breakfast.   Yes [provider]  magnesium hydroxide (MILK OF MAGNESIA) 400 MG/5ML suspension Take 30 mLs by mouth every 12 (twelve) hours as needed for mild constipation.   Yes [provider]  midodrine (PROAMATINE) 2.5 MG tablet Take 1 tablet (2.5 mg total) by mouth 3 (three) times daily with meals. 09/10/16  Yes Elgergawy, Silver Huguenin, MD  Multiple Vitamin (MULTIVITAMIN) tablet Take 1 tablet by mouth daily. 06/13/17  Yes [provider]  NON FORMULARY Take 120 mLs by mouth See admin instructions. MedPass shake- Drink 120 ml's by mouth three times a day   Yes [provider]  omeprazole (PRILOSEC) 20 MG capsule Take 20 mg by mouth in the morning.   Yes [provider]  ondansetron (ZOFRAN) 4 MG tablet Take 4 mg by mouth every 6 (six) hours as needed for nausea or vomiting.   Yes [provider]  polyethylene glycol (MIRALAX / GLYCOLAX) packet Take 17 g by mouth See admin instructions. Mix 17 grams of powder into 4-8 ounces of water and drink every morning   Yes [provider]  Probiotic  Product (PROBIOTIC PO) Take 1 tablet by mouth daily.   Yes [provider]  PROTEIN PO Take 30 mLs by mouth 2 (two) times daily.   Yes [provider]  SANTYL ointment Apply 1 application topically See admin instructions. Apply to right heel topically every day shift- clean with Dakin's, apply Santyl, and cover with normal saline gauze with foam dressing   Yes [provider]  tiZANidine (ZANAFLEX) 2 MG tablet Take 2 mg by mouth 3 (three) times daily.   Yes [provider]  Nutritional Supplements (NUTRITIONAL SUPPLEMENT PO) Regular Diet - Regular Texture Patient not taking: Reported on 11/06/2020    [provider]  Nutritional Supplements (PROMOD) LIQD Protein Liquid - give 30 ml by mouth two times daily Patient not taking: Reported on 11/06/2020  [provider]    Allergies    Natalizumab  Review of Systems   Review of Systems  Constitutional:  Negative for chills and fever.  HENT:  Negative for congestion and rhinorrhea.   Eyes:  Negative for redness and visual disturbance.  Respiratory:  Negative for shortness of breath and wheezing.   Cardiovascular:  Negative for chest pain and palpitations.  Gastrointestinal:  Negative for nausea and vomiting.  Genitourinary:  Negative for dysuria and urgency.  Musculoskeletal:  Negative for arthralgias and myalgias.  Skin:  Negative for pallor and wound.  Neurological:  Negative for dizziness and headaches.   Physical Exam Updated Vital Signs BP 111/75   Pulse 88   Temp 98.1 F (36.7 C) (Oral)   Resp 14   SpO2 100%   Physical Exam Vitals and nursing note reviewed.  Constitutional:      General: She is not in acute distress.    Appearance: She is well-developed. She is not diaphoretic.     Comments: Cachectic, contractured  HENT:     Head: Normocephalic and atraumatic.  Eyes:     Pupils: Pupils are equal, round, and reactive to light.  Cardiovascular:     Rate and Rhythm:  Normal rate and regular rhythm.     Heart sounds: No murmur heard.   No friction rub. No gallop.  Pulmonary:     Effort: Pulmonary effort is normal.     Breath sounds: No wheezing or rales.  Abdominal:     General: There is no distension.     Palpations: Abdomen is soft.     Tenderness: There is no abdominal tenderness.  Musculoskeletal:        General: No tenderness.     Cervical back: Normal range of motion and neck supple.     Comments: A couple beats of spasm with  Chvostek's.  Skin:    General: Skin is warm and dry.  Neurological:     Mental Status: She is alert and oriented to person, place, and time.  Psychiatric:        Behavior: Behavior normal.    ED Results / Procedures / Treatments   Labs (all labs ordered are listed, but only abnormal results are displayed) Labs Reviewed  CBC WITH DIFFERENTIAL/PLATELET - Abnormal; Notable for the following components:      Result Value   RBC 3.57 (*)    Hemoglobin 10.3 (*)    HCT 33.6 (*)    Platelets 653 (*)    Abs Immature Granulocytes 0.11 (*)    All other components within normal limits  COMPREHENSIVE METABOLIC PANEL - Abnormal; Notable for the following components:   Calcium 4.1 (*)    Albumin 3.0 (*)    Alkaline Phosphatase 142 (*)    Total Bilirubin 0.2 (*)    Anion gap 16 (*)    All other components within normal limits  T4, FREE - Abnormal; Notable for the following components:   Free T4 1.22 (*)    All other components within normal limits  MAGNESIUM - Abnormal; Notable for the following components:   Magnesium 1.1 (*)    All other components within normal limits  PHOSPHORUS - Abnormal; Notable for the following components:   Phosphorus 8.3 (*)    All other components within normal limits  I-STAT CHEM 8, ED - Abnormal; Notable for the following components:   Glucose, Bld 103 (*)    Calcium, Ion 0.38 (*)    TCO2 20 (*)  All other components within normal limits  TSH  PARATHYROID HORMONE, INTACT (NO CA)     EKG EKG Interpretation  Date/Time:  Thursday November 06 2020 15:53:33 EDT Ventricular Rate:  97 PR Interval:  143 QRS Duration: 75 QT Interval:  407 QTC Calculation: 517 R Axis:   -5 Text Interpretation: Sinus rhythm Left ventricular hypertrophy Anterior Q waves, possibly due to LVH Prolonged QT interval Otherwise no significant change Confirmed by Deno Etienne [phone]) on 11/06/2020 4:10:58 PM  Radiology No results found.  Procedures Procedures   Medications Ordered in ED Medications  sodium chloride 0.9 % bolus 1,000 mL (1,000 mLs Intravenous New Bag/Given 11/06/20 1742)  calcium gluconate 1 g/ 50 mL sodium chloride IVPB (0 mg Intravenous Stopped 11/06/20 1857)  magnesium sulfate IVPB 2 g 50 mL (0 g Intravenous Stopped 11/06/20 1807)    ED Course  I have reviewed the triage vital signs and the nursing notes.  Pertinent labs & imaging results that were available during my care of the patient were reviewed by me and considered in my medical decision making (see chart for details).    MDM Rules/Calculators/A&P                           45 yo F with a chief complaints of hypocalcemia.  This was noted on blood work today.  Patient was just in the hospital for hypercalcemia.  We will obtain a laboratory evaluation bolus of IV fluids reassess.  Calcium corrected 4.9.  Given IV calcium and IV mag.  Will discuss with medicine for admission.  CRITICAL CARE Performed by: Cecilio Asper   Total critical care time: 35 minutes  Critical care time was exclusive of separately billable procedures and treating other patients.  Critical care was necessary to treat or prevent imminent or life-threatening deterioration.  Critical care was time spent personally by me on the following activities: development of treatment plan with patient and/or surrogate as well as nursing, discussions with consultants, evaluation of patient's response to treatment, examination of patient, obtaining  history from patient or surrogate, ordering and performing treatments and interventions, ordering and review of laboratory studies, ordering and review of radiographic studies, pulse oximetry and re-evaluation of patient's condition.  The patients results and plan were reviewed and discussed.   Any x-rays performed were independently reviewed by myself.   Differential diagnosis were considered with the presenting HPI.  Medications  sodium chloride 0.9 % bolus 1,000 mL (1,000 mLs Intravenous New Bag/Given 11/06/20 1742)  calcium gluconate 1 g/ 50 mL sodium chloride IVPB (0 mg Intravenous Stopped 11/06/20 1857)  magnesium sulfate IVPB 2 g 50 mL (0 g Intravenous Stopped 11/06/20 1807)    Vitals:   11/06/20 1815 11/06/20 1830 11/06/20 1845 11/06/20 1900  BP: 100/71 99/68 111/76 111/75  Pulse: 91 87 87 88  Resp: 14 15 17 14   Temp:      TempSrc:      SpO2: 99% 98% 100% 100%    Final diagnoses:  Hypocalcemia    Admission/ observation were discussed with the admitting physician, patient and/or family and they are comfortable with the plan.    Final Clinical Impression(s) / ED Diagnoses Final diagnoses:  Hypocalcemia    Rx / DC Orders ED Discharge Orders     None        Deno Etienne, DO 11/06/20 1914

## 2020-11-06 NOTE — Subjective & Objective (Signed)
CC: hypocalcemia HPI: 45 year old African-American female with a history of severe MS, spastic paraplegia secondary MS, multiple decubitus ulcers over sacral area, protein calorie malnutrition, hypothyroidism was sent to the ER today by the nursing home due to persistently low calcium levels on outpatient labs.  Patient been admitted earlier in September 2022 due to hypercalcemia with serum calcium score than 15.  She received a dose of 60 mg of pamidronate on 10-16-2020.  Subsequently she had several episodes of hypocalcemia while still in the hospital.  She had to be treated with multiple doses of IV calcium.  Patient states that she does not feel bad at all.  She has not had any unusual spasms.  She does have a history of MS so she is quite used to having's spasms.  She is on several medications to help with spasms including baclofen and Zanaflex.  Patient denies any palpitations, chest pain or shortness of breath. On arrival to the ER, temperature was 98.1 heart rate 98 respirations 19 blood pressure 103/74.  Labs showed a serum calcium of 4.1 with albumin 3.0.  Magnesium level was 1.1.  Phosphorus was elevated at 8.3.  Ionized calcium was low at 0.38.  Patient given 1 g of IV calcium gluconate 2 g of IV magnesium.  Due to the patient's persistently low calcium, [facility] hospitalist contacted for admission.

## 2020-11-06 NOTE — ED Notes (Signed)
Magnesium rate of 227mL/hr verified with MD

## 2020-11-07 ENCOUNTER — Inpatient Hospital Stay (HOSPITAL_COMMUNITY): Payer: Medicare (Managed Care)

## 2020-11-07 DIAGNOSIS — Z79899 Other long term (current) drug therapy: Secondary | ICD-10-CM | POA: Diagnosis not present

## 2020-11-07 DIAGNOSIS — K5909 Other constipation: Secondary | ICD-10-CM | POA: Diagnosis present

## 2020-11-07 DIAGNOSIS — E43 Unspecified severe protein-calorie malnutrition: Secondary | ICD-10-CM | POA: Diagnosis not present

## 2020-11-07 DIAGNOSIS — R64 Cachexia: Secondary | ICD-10-CM | POA: Diagnosis not present

## 2020-11-07 DIAGNOSIS — Z66 Do not resuscitate: Secondary | ICD-10-CM | POA: Diagnosis not present

## 2020-11-07 DIAGNOSIS — G8929 Other chronic pain: Secondary | ICD-10-CM | POA: Diagnosis present

## 2020-11-07 DIAGNOSIS — E46 Unspecified protein-calorie malnutrition: Secondary | ICD-10-CM | POA: Diagnosis not present

## 2020-11-07 DIAGNOSIS — R569 Unspecified convulsions: Secondary | ICD-10-CM

## 2020-11-07 DIAGNOSIS — Z818 Family history of other mental and behavioral disorders: Secondary | ICD-10-CM | POA: Diagnosis not present

## 2020-11-07 DIAGNOSIS — R627 Adult failure to thrive: Secondary | ICD-10-CM | POA: Diagnosis not present

## 2020-11-07 DIAGNOSIS — D638 Anemia in other chronic diseases classified elsewhere: Secondary | ICD-10-CM | POA: Diagnosis not present

## 2020-11-07 DIAGNOSIS — M2450 Contracture, unspecified joint: Secondary | ICD-10-CM | POA: Diagnosis present

## 2020-11-07 DIAGNOSIS — N319 Neuromuscular dysfunction of bladder, unspecified: Secondary | ICD-10-CM | POA: Diagnosis present

## 2020-11-07 DIAGNOSIS — Z23 Encounter for immunization: Secondary | ICD-10-CM | POA: Diagnosis not present

## 2020-11-07 DIAGNOSIS — G8220 Paraplegia, unspecified: Secondary | ICD-10-CM | POA: Diagnosis not present

## 2020-11-07 DIAGNOSIS — G35 Multiple sclerosis: Secondary | ICD-10-CM | POA: Diagnosis present

## 2020-11-07 DIAGNOSIS — E872 Acidosis, unspecified: Secondary | ICD-10-CM | POA: Diagnosis not present

## 2020-11-07 DIAGNOSIS — Z681 Body mass index (BMI) 19 or less, adult: Secondary | ICD-10-CM | POA: Diagnosis not present

## 2020-11-07 DIAGNOSIS — L89150 Pressure ulcer of sacral region, unstageable: Secondary | ICD-10-CM | POA: Diagnosis not present

## 2020-11-07 DIAGNOSIS — G9341 Metabolic encephalopathy: Secondary | ICD-10-CM | POA: Diagnosis not present

## 2020-11-07 DIAGNOSIS — I9589 Other hypotension: Secondary | ICD-10-CM | POA: Diagnosis not present

## 2020-11-07 DIAGNOSIS — I959 Hypotension, unspecified: Secondary | ICD-10-CM | POA: Diagnosis present

## 2020-11-07 DIAGNOSIS — Z20822 Contact with and (suspected) exposure to covid-19: Secondary | ICD-10-CM | POA: Diagnosis not present

## 2020-11-07 DIAGNOSIS — E039 Hypothyroidism, unspecified: Secondary | ICD-10-CM | POA: Diagnosis present

## 2020-11-07 DIAGNOSIS — Z515 Encounter for palliative care: Secondary | ICD-10-CM | POA: Diagnosis not present

## 2020-11-07 LAB — COMPREHENSIVE METABOLIC PANEL
ALT: 11 U/L (ref 0–44)
AST: 17 U/L (ref 15–41)
Albumin: 2.8 g/dL — ABNORMAL LOW (ref 3.5–5.0)
Alkaline Phosphatase: 135 U/L — ABNORMAL HIGH (ref 38–126)
Anion gap: 19 — ABNORMAL HIGH (ref 5–15)
BUN: 17 mg/dL (ref 6–20)
CO2: 17 mmol/L — ABNORMAL LOW (ref 22–32)
Calcium: 4.3 mg/dL — CL (ref 8.9–10.3)
Chloride: 109 mmol/L (ref 98–111)
Creatinine, Ser: 0.69 mg/dL (ref 0.44–1.00)
GFR, Estimated: >60 mL/min (ref >60)
Glucose, Bld: 87 mg/dL (ref 70–99)
Potassium: 3.7 mmol/L (ref 3.5–5.1)
Sodium: 145 mmol/L (ref 135–145)
Total Bilirubin: 0.5 mg/dL (ref 0.3–1.2)
Total Protein: 7.2 g/dL (ref 6.5–8.1)

## 2020-11-07 LAB — RESP PANEL BY RT-PCR (FLU A&B, COVID) ARPGX2
Influenza A by PCR: NEGATIVE
Influenza B by PCR: NEGATIVE
SARS Coronavirus 2 by RT PCR: NEGATIVE

## 2020-11-07 LAB — BASIC METABOLIC PANEL
Anion gap: 13 (ref 5–15)
BUN: 15 mg/dL (ref 6–20)
CO2: 23 mmol/L (ref 22–32)
Calcium: 6.9 mg/dL — ABNORMAL LOW (ref 8.9–10.3)
Chloride: 106 mmol/L (ref 98–111)
Creatinine, Ser: 0.69 mg/dL (ref 0.44–1.00)
GFR, Estimated: >60 mL/min (ref >60)
Glucose, Bld: 148 mg/dL — ABNORMAL HIGH (ref 70–99)
Potassium: 3.7 mmol/L (ref 3.5–5.1)
Sodium: 142 mmol/L (ref 135–145)

## 2020-11-07 LAB — PARATHYROID HORMONE, INTACT (NO CA): PTH: 6 pg/mL — ABNORMAL LOW (ref 15–65)

## 2020-11-07 LAB — MAGNESIUM
Magnesium: 1.5 mg/dL — ABNORMAL LOW (ref 1.7–2.4)
Magnesium: 2.4 mg/dL (ref 1.7–2.4)

## 2020-11-07 LAB — PHOSPHORUS: Phosphorus: 5.9 mg/dL — ABNORMAL HIGH (ref 2.5–4.6)

## 2020-11-07 MED ORDER — SODIUM CHLORIDE 0.9 % IV SOLN
750.0000 mg | Freq: Two times a day (BID) | INTRAVENOUS | Status: DC
  Filled 2020-11-07 (×4): qty 7.5

## 2020-11-07 MED ORDER — LEVOTHYROXINE SODIUM 100 MCG PO TABS
100.0000 ug | ORAL_TABLET | Freq: Every day | ORAL | Status: DC
  Administered 2020-11-07 – 2020-11-12 (×6): 100 ug via ORAL
  Filled 2020-11-07 (×6): qty 1

## 2020-11-07 MED ORDER — POLYETHYLENE GLYCOL 3350 17 G PO PACK: 17.0000 g | PACK | ORAL | Status: DC

## 2020-11-07 MED ORDER — BACLOFEN 10 MG PO TABS
10.0000 mg | ORAL_TABLET | Freq: Three times a day (TID) | ORAL | Status: DC
  Administered 2020-11-07 – 2020-11-12 (×16): 10 mg via ORAL
  Filled 2020-11-07 (×17): qty 1

## 2020-11-07 MED ORDER — LORAZEPAM 2 MG/ML IJ SOLN
2.0000 mg | Freq: Four times a day (QID) | INTRAMUSCULAR | Status: DC | PRN
  Administered 2020-11-09: 2 mg via INTRAVENOUS
  Filled 2020-11-07: qty 1

## 2020-11-07 MED ORDER — SODIUM CHLORIDE 0.9 % IV SOLN
750.0000 mg | Freq: Once | INTRAVENOUS | Status: AC
  Administered 2020-11-07: 750 mg via INTRAVENOUS
  Filled 2020-11-07: qty 7.5

## 2020-11-07 MED ORDER — ASCORBIC ACID 500 MG PO TABS
500.0000 mg | ORAL_TABLET | Freq: Every morning | ORAL | Status: DC
  Administered 2020-11-07 – 2020-11-12 (×6): 500 mg via ORAL
  Filled 2020-11-07 (×6): qty 1

## 2020-11-07 MED ORDER — MAGNESIUM OXIDE -MG SUPPLEMENT 400 (240 MG) MG PO TABS
400.0000 mg | ORAL_TABLET | Freq: Two times a day (BID) | ORAL | Status: DC
  Administered 2020-11-07 – 2020-11-09 (×7): 400 mg via ORAL
  Filled 2020-11-07 (×7): qty 1

## 2020-11-07 MED ORDER — HEPARIN SODIUM (PORCINE) 5000 UNIT/ML IJ SOLN
5000.0000 [IU] | Freq: Three times a day (TID) | INTRAMUSCULAR | Status: DC
  Administered 2020-11-07 – 2020-11-12 (×18): 5000 [IU] via SUBCUTANEOUS
  Filled 2020-11-07 (×18): qty 1

## 2020-11-07 MED ORDER — MAGNESIUM SULFATE 4 GM/100ML IV SOLN
4.0000 g | Freq: Once | INTRAVENOUS | Status: AC
  Administered 2020-11-07: 4 g via INTRAVENOUS
  Filled 2020-11-07: qty 100

## 2020-11-07 MED ORDER — RISAQUAD PO CAPS
1.0000 | ORAL_CAPSULE | Freq: Every day | ORAL | Status: DC
  Administered 2020-11-07 – 2020-11-12 (×6): 1 via ORAL
  Filled 2020-11-07 (×6): qty 1

## 2020-11-07 MED ORDER — ACETAMINOPHEN 650 MG RE SUPP: 650.0000 mg | Freq: Four times a day (QID) | RECTAL | Status: DC | PRN

## 2020-11-07 MED ORDER — MIDODRINE HCL 5 MG PO TABS
2.5000 mg | ORAL_TABLET | Freq: Three times a day (TID) | ORAL | Status: DC
  Administered 2020-11-07 – 2020-11-12 (×17): 2.5 mg via ORAL
  Filled 2020-11-07 (×16): qty 1

## 2020-11-07 MED ORDER — CALCITRIOL 0.25 MCG PO CAPS
0.2500 ug | ORAL_CAPSULE | Freq: Two times a day (BID) | ORAL | Status: DC
  Administered 2020-11-07 – 2020-11-12 (×11): 0.25 ug via ORAL
  Filled 2020-11-07 (×12): qty 1

## 2020-11-07 MED ORDER — CALCIUM GLUCONATE-NACL 2-0.675 GM/100ML-% IV SOLN
2.0000 g | Freq: Once | INTRAVENOUS | Status: AC
  Administered 2020-11-07: 2000 mg via INTRAVENOUS
  Filled 2020-11-07: qty 100

## 2020-11-07 MED ORDER — LEVETIRACETAM 500 MG PO TABS
500.0000 mg | ORAL_TABLET | Freq: Two times a day (BID) | ORAL | Status: DC
  Administered 2020-11-07 (×2): 500 mg via ORAL
  Filled 2020-11-07 (×2): qty 1

## 2020-11-07 MED ORDER — CALCITRIOL 0.25 MCG PO CAPS
0.2500 ug | ORAL_CAPSULE | Freq: Every day | ORAL | Status: DC
  Filled 2020-11-07: qty 1

## 2020-11-07 MED ORDER — ACETAMINOPHEN 325 MG PO TABS
650.0000 mg | ORAL_TABLET | Freq: Four times a day (QID) | ORAL | Status: DC | PRN
  Administered 2020-11-12: 650 mg via ORAL
  Filled 2020-11-07: qty 2

## 2020-11-07 MED ORDER — CALCIUM CARBONATE ANTACID 500 MG PO CHEW
2.0000 | CHEWABLE_TABLET | Freq: Three times a day (TID) | ORAL | Status: DC
  Administered 2020-11-07 – 2020-11-08 (×6): 400 mg via ORAL
  Filled 2020-11-07 (×5): qty 2

## 2020-11-07 MED ORDER — POLYETHYLENE GLYCOL 3350 17 G PO PACK
17.0000 g | PACK | Freq: Every day | ORAL | Status: DC
  Administered 2020-11-07 – 2020-11-12 (×4): 17 g via ORAL
  Filled 2020-11-07 (×5): qty 1

## 2020-11-07 MED ORDER — TIZANIDINE HCL 4 MG PO TABS
2.0000 mg | ORAL_TABLET | Freq: Three times a day (TID) | ORAL | Status: DC
  Administered 2020-11-07 – 2020-11-12 (×16): 2 mg via ORAL
  Filled 2020-11-07 (×21): qty 1

## 2020-11-07 MED ORDER — LEVETIRACETAM IN NACL 500 MG/100ML IV SOLN
500.0000 mg | Freq: Two times a day (BID) | INTRAVENOUS | Status: DC
  Administered 2020-11-08 – 2020-11-12 (×9): 500 mg via INTRAVENOUS
  Filled 2020-11-07 (×10): qty 100

## 2020-11-07 MED ORDER — BISACODYL 5 MG PO TBEC
5.0000 mg | DELAYED_RELEASE_TABLET | Freq: Every day | ORAL | Status: DC
  Administered 2020-11-07 – 2020-11-12 (×4): 5 mg via ORAL
  Filled 2020-11-07 (×4): qty 1

## 2020-11-07 MED ORDER — PANTOPRAZOLE SODIUM 40 MG PO TBEC
40.0000 mg | DELAYED_RELEASE_TABLET | Freq: Every day | ORAL | Status: DC
  Administered 2020-11-07 – 2020-11-12 (×6): 40 mg via ORAL
  Filled 2020-11-07 (×6): qty 1

## 2020-11-07 NOTE — Progress Notes (Signed)
EEG complete - results pending 

## 2020-11-07 NOTE — ED Notes (Signed)
This EMT assisted the patient with eating breakfast. This EMT handed the patient portions of food and the patient was able to feed herself. The patient decided to take a break from eating and the patient was informed to let the nursing staff when she was ready to resume.

## 2020-11-07 NOTE — ED Notes (Signed)
Pt's stoma was cleaned, urostomy bag changed, she was repositioned in bed for comfort.

## 2020-11-07 NOTE — ED Notes (Signed)
Nurse at facility updated. per the family and nurse at her facility pt had a seizure like activity on 10/3. She was treated with ativan it wsa under 5 minutes. She has no neurologist established. They were wondering if neuro can see her here. MD aware.

## 2020-11-07 NOTE — Procedures (Signed)
Patient Name: Deanna Baldwin  MRN: [id number]  Epilepsy Attending: Lora Havens  Referring Physician/Provider: Anibal Henderson, NP Date: 11/07/2020 Duration: 22.48 mins  Patient history: 45 y.o. female who presented to the ED 10/6 due to lab abnormalities identified at patient's facility with reports of seizure-like activity 10/3. Unable to obtain further collateral information other than 1 mg IM lorazepam administered for seizure-like activity lasting < 5 minutes. EEG to evaluate for seizure  Level of alertness: Awake  AEDs during EEG study: LEV  Technical aspects: This EEG study was done with scalp electrodes positioned according to the 10-20 International system of electrode placement. Electrical activity was acquired at a sampling rate of 500Hz  and reviewed with a high frequency filter of 70Hz  and a low frequency filter of 1Hz . EEG data were recorded continuously and digitally stored.   Description: The posterior dominant rhythm consists of 8Hz  activity of moderate voltage (25-35 uV) seen predominantly in posterior head regions, symmetric and reactive to eye opening and eye closing. Hyperventilation and photic stimulation were not performed.     IMPRESSION: This study is within normal limits. No seizures or epileptiform discharges were seen throughout the recording.  Jordy Verba Barbra Sarks

## 2020-11-07 NOTE — ED Notes (Signed)
Attempted to call Roland Rack, at [phone] pts nurse

## 2020-11-07 NOTE — Consult Note (Signed)
[facility] Nurse Consult Note: Patient receiving care in [facility] Reason for Consult: Sacral wound Wound type: sacral wound on the lower left buttock that measures 4 cm x 3 cm x 0.1 cm with moderate drainage that is yellow/brown surrounded by pink scar tissue Right trochanter wound that is healing, brown center with scant brown drainage on dressing.  Pressure Injury POA: Yes Measurement: 4 cm x 3 cm x 0.1 cm pink with minimal drainage that is yellow/brown.  The coccyx area is pink scar tissue from previous wounds.  Dressing procedure/placement/frequency: Place a hydrocolloid dressing over the left lower buttock wound Kellie Simmering # [phone]) change every 3-4 days or PRN soiling or peeling off.  Right trochanter wound, clean with NS, pat dry then place a small piece of non-adherent gauze over the wound and secure with Medipore tape. Change daily Continue sacral foam dressing to the coccyx area. Change every 3 days or PRN soiling. Place small foam dressings over bony prominences and heels.   Monitor the wound area(s) for worsening of condition such as: Signs/symptoms of infection, increase in size, development of or worsening of odor, development of pain, or increased pain at the affected locations.   Notify the medical team if any of these develop.  [facility] Nurse ostomy consult note Stoma type/location: RLQ ileal conduit  Stomal assessment/size: 3/4" red budded Peristomal assessment: Pouch changed last night. Not assessed however the pouch that was applied last night is a colostomy pouch. Spoke with EMT at bedside about pouch needing to be replaced to a urostomy pouch with spout, adapter and urine drainage bag. Kellie Simmering #s placed under Urostomy care in nursing section) Treatment options for stomal/peristomal skin: Barrier Ring for skin protection Output: clear yellow urine Ostomy pouching: 1pc.  Education provided: None 1 piece urostomy pouching system  Urostomy pouch Kellie Simmering # 3)                      Kizzie Bane, Kellie Simmering # [phone])   Drainage bag adapter Kellie Simmering # [phone])  Urine collection bag Kellie Simmering # [phone])  1 piece urostomy pouching system            Pressure Injury Prevention Bundle May use any that apply to this patient. Support surfaces (air mattress) chair cushion Kellie Simmering # [phone]) Heel offloading boots Kellie Simmering # [phone]) Turning and Positioning  Measures to reduce shear (draw sheet, knees up) Skin protection Products (Foam dressing) Moisture management products (Critic-Aid Barrier Cream (Purple top) Sween moisturizing lotion (Pink top in clean supply) Nutrition Management Protection for Medical Devices Routine Skin Assessment   Thank you for the consult. [facility] nurse will not follow at this time.   Please re-consult the [facility] team if needed.  Cathlean Marseilles Tamala Julian, MSN, RN, [place], Lysle Pearl, [facility] Wound Treatment Associate Pager [phone]

## 2020-11-07 NOTE — Progress Notes (Signed)
Pt brought down to MRI via pt transport. Upon scanning pt, pt struggled to remain still moving head back and forth while continuously reaching for the head coil used for imaging. Several attempts made to verbally redirect pt. RN contacted in hopes to administer meds to improve pt condition. RN unable to provide meds at this time. Images significantly motion degraded. Unable to obtain diagnostic images in pt's current condition. Pt sent back to ED via pt transport.

## 2020-11-07 NOTE — Progress Notes (Addendum)
PROGRESS NOTE    Deanna Baldwin  LTJ:[id number] DOB: August 10, 1975 DOA: 11/06/2020 PCP: [name], MD   Chief Complaint  Patient presents with   abnormal labs   Brief Narrative/Hospital Course: 16 old female with history of severe MS with spastic paraplegia contracture, multiple decubitus ulcer over sacral area, protein calorie moderation hypothyroidism recent admission for severe hypercalcemia due to dehydration treated with calcitonin and pamidronate subsequently had hypo calcium and that was aggressively repleted and discharged to skilled nursing facility/nursing home returns to the ED with abnormal labs with low calcium. In the ED patient was asymptomatic, vitals stable labs showed calcium 4.111 3.0 mag 1.1 phosphorus 8.3 ionized calcium 0.38 given 1 g calcium calcium gluconate magnesium and admitted  Subjective: Seen this am Is alert, awake oriented. Lips dry ate with assistance  Assessment & Plan:  Hypocalcemia: ca this am at 4.3, corrected ca 5.3.  Multifactorial suspect due to recent use of pamidronate for hypercalcemia.  We will replete with IV calcium gluconate 2 g x 1, calcitriol increased to twice daily and Tums 4 times a day this was discussed with on-call nephrologist [name] advised to continue the same oral regimen monitor calcium and replete IV as needed.  Replete magnesium.  Hypomagnesemia: mag 1.5: Repleting IV this morning.  Mild metabolic acidosis hco3 at 17: Encourage oral intake.  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  Severe protein calorie malnutrition: Patient reports she has "high metabolism" can she can eat orally and not gain weight.  Continue to augment diet as per dietitian.  Chronic constipation supportive care laxatives as  needed  hypotension on midodrine.  Systolic blood pressure at times ranges from 80s to 90s  Hypothyroidism continue Synthroid  Episode of seizure at [facility] on 10/3= informed by facility nursing staff/sister today afternoon- I have requested neuro consult. Monitor lytes and replete. Keep on seizure precautions. Diet Order             Diet regular Room service appropriate? Yes; Fluid consistency: Thin  Diet effective now                   DVT prophylaxis: heparin injection 5,000 Units Start: 11/07/20 0045 Foot Pump / plexipulse Start: 11/07/20 0040 Code Status:   Code Status: Full Code  Family Communication: plan of care discussed with patient at bedside. Status is: admited Observation  Patient remains hospitalized will need at least 2 midnight care for management of severe hypocalcemia needing IV replacement Dispo: The patient is from:  [place] home              Anticipated d/c is to:  [facility]              Patient currently is not medically stable to d/c.   Difficult to place patient No       Objective: Vitals: Today's Vitals   11/07/20 0630 11/07/20 0645 11/07/20 0700 11/07/20 1000  BP: 104/75 102/72 101/75 103/73  Pulse: (!) 105 (!) 104 (!) 105 99  Resp: 16 16 16  (!) 25  Temp:      TempSrc:      SpO2: 98% 98% 99% 96%  PainSc:       Physical Examination: General exam: AA oriented at baseline, cachectic thin looking, weak,older than stated age. HEENT:Oral mucosa moist, Ear/Nose WNL grossly,dentition normal.  Respiratory system: B/l diminished BS, no use of accessory muscle, non tender. Cardiovascular system: S1 & S2 +,No JVD. Gastrointestinal system: Abdomen soft, NT,ND, BS+.  Urostomy in place with clear urine Nervous System:Alert, awake, moving extremities. Extremities: Leg edema none, contractures in lower extremities.   Skin: No rashes, no icterus. MSK: small muscle bulk,tone, power.  Medications reviewed:  Scheduled Meds:   acidophilus  1 capsule Oral Daily   ascorbic acid  500 mg Oral q AM   baclofen  10 mg Oral TID   bisacodyl  5 mg Oral Daily   calcitRIOL  0.25 mcg Oral BID   calcium carbonate  2 tablet Oral TID WC & HS   heparin  5,000 Units Subcutaneous Q8H   levETIRAcetam  500 mg Oral BID   levothyroxine  100 mcg Oral QAC breakfast   magnesium oxide  400 mg Oral BID   midodrine  2.5 mg Oral TID WC   pantoprazole  40 mg Oral Daily   polyethylene glycol  17 g Oral Daily   tiZANidine  2 mg Oral TID   Continuous Infusions:  calcium gluconate 2,000 mg (11/07/20 1105)   magnesium sulfate bolus IVPB 4 g (11/07/20 1106)    Intake/Output  Intake/Output Summary (Last 24 hours) at 11/07/2020 1158 Last data filed at 11/06/2020 2314 Gross per 24 hour  Intake 1100 ml  Output --  Net 1100 ml   Intake/Output from previous day: 10/06 0701 - 10/07 0700 In: 1100 [IV Piggyback:1100] Out: -  Net IO Since Admission: 1,100 mL [11/07/20 1158]   Weight change:   Wt Readings from Last 3 Encounters:  10/24/20 42.7 kg  09/28/18 43.1 kg  11/17/17 40.6 kg     Consultants:see note  Procedures:see note Antimicrobials: Anti-infectives (From admission, onward)    None      Culture/Microbiology    Component Value Date/Time   SDES BLOOD RIGHT ANTECUBITAL 10/18/2020 0254   SDES BLOOD RIGHT HAND 10/18/2020 0254   SPECREQUEST  10/18/2020 0254    BOTTLES DRAWN AEROBIC AND ANAEROBIC Blood Culture adequate volume   SPECREQUEST  10/18/2020 0254    BOTTLES DRAWN AEROBIC AND ANAEROBIC Blood Culture adequate volume   CULT  10/18/2020 0254    NO GROWTH 5 DAYS Performed at [facility], [place] [address], [place], [place] [address]    CULT  10/18/2020 0254    NO GROWTH 5 DAYS Performed at [facility], [place] [address], [place], [place] [address]    REPTSTATUS 10/23/2020 FINAL 10/18/2020 0254   REPTSTATUS 10/23/2020 FINAL 10/18/2020 0254    Other culture-see note  Unresulted Labs (From admission,  onward)     Start     Ordered   11/08/20 0500  calcium, ionized  (calcium gluconate and labs)  Tomorrow morning,   R        11/07/20 0909   11/08/20 0500  Comprehensive metabolic panel  Daily,   R     Question:  Specimen collection method  Answer:  Lab=Lab collect   11/07/20 1158   11/08/20 8938  Basic metabolic panel  Daily,   R     Question:  Specimen collection method  Answer:  Lab=Lab collect   11/07/20 1158   11/08/20 0500  Magnesium  Tomorrow morning,   R       Question:  Specimen collection method  Answer:  Lab=Lab collect   11/07/20 1158   11/08/20 0500  Phosphorus  Tomorrow morning,   R  Question:  Specimen collection method  Answer:  Lab=Lab collect   11/07/20 1158   11/07/20 1208  calcium, ionized  (calcium gluconate and labs)  Once-Timed,   TIMED        11/07/20 0909   11/07/20 0500  Calcium, ionized  Tomorrow morning,   R        11/07/20 0039            Data Reviewed: I have personally reviewed following labs and imaging studies CBC: Recent Labs  Lab 11/06/20 1700 11/06/20 1707  WBC  --  9.5  NEUTROABS  --  7.6  HGB 12.9 10.3*  HCT 38.0 33.6*  MCV  --  94.1  PLT  --  093*   Basic Metabolic Panel: Recent Labs  Lab 11/06/20 1700 11/06/20 1707 11/07/20 0500  NA 142 144 145  K 4.0 3.6 3.7  CL 110 106 109  CO2  --  22 17*  GLUCOSE 103* 99 87  BUN 20 19 17   CREATININE 0.50 0.60 0.69  CALCIUM  --  4.1* 4.3*  MG  --  1.1* 1.5*  PHOS  --  8.3*  --    GFR: CrCl cannot be calculated (Unknown ideal weight.). Liver Function Tests: Recent Labs  Lab 11/06/20 1707 11/07/20 0500  AST 19 17  ALT 10 11  ALKPHOS 142* 135*  BILITOT 0.2* 0.5  PROT 7.8 7.2  ALBUMIN 3.0* 2.8*   No results for input(s): LIPASE, AMYLASE in the last 168 hours. No results for input(s): AMMONIA in the last 168 hours. Coagulation Profile: No results for input(s): INR, PROTIME in the last 168 hours. Cardiac Enzymes: No results for input(s): CKTOTAL, CKMB, CKMBINDEX,  TROPONINI in the last 168 hours. BNP (last 3 results) No results for input(s): PROBNP in the last 8760 hours. HbA1C: No results for input(s): HGBA1C in the last 72 hours. CBG: No results for input(s): GLUCAP in the last 168 hours. Lipid Profile: No results for input(s): CHOL, HDL, LDLCALC, TRIG, CHOLHDL, LDLDIRECT in the last 72 hours. Thyroid Function Tests: Recent Labs    11/06/20 1707  TSH 3.839  FREET4 1.22*   Anemia Panel: No results for input(s): VITAMINB12, FOLATE, FERRITIN, TIBC, IRON, RETICCTPCT in the last 72 hours. Sepsis Labs: No results for input(s): PROCALCITON, LATICACIDVEN in the last 168 hours.  Recent Results (from the past 240 hour(s))  Resp Panel by RT-PCR (Flu A&B, Covid) Nasopharyngeal Swab     Status: None   Collection Time: 11/07/20  1:27 AM   Specimen: Nasopharyngeal Swab; Nasopharyngeal(NP) swabs in vial transport medium  Result Value Ref Range Status   SARS Coronavirus 2 by RT PCR NEGATIVE NEGATIVE Final    Comment: (NOTE) SARS-CoV-2 target nucleic acids are NOT DETECTED.  The SARS-CoV-2 RNA is generally detectable in upper respiratory specimens during the acute phase of infection. The lowest concentration of SARS-CoV-2 viral copies this assay can detect is 138 copies/mL. A negative result does not preclude SARS-Cov-2 infection and should not be used as the sole basis for treatment or other patient management decisions. A negative result may occur with  improper specimen collection/handling, submission of specimen other than nasopharyngeal swab, presence of viral mutation(s) within the areas targeted by this assay, and inadequate number of viral copies(<138 copies/mL). A negative result must be combined with clinical observations, patient history, and epidemiological information. The expected result is Negative.  Fact Sheet for Patients:  EntrepreneurPulse.com.au  Fact Sheet for Healthcare Providers:   IncredibleEmployment.be  This test is no  t yet approved or cleared by the [place] and  has been authorized for detection and/or diagnosis of SARS-CoV-2 by FDA under an Emergency Use Authorization (EUA). This EUA will remain  in effect (meaning this test can be used) for the duration of the COVID-19 declaration under Section 564(b)(1) of the Act, 21 U.S.C.section 360bbb-3(b)(1), unless the authorization is terminated  or revoked sooner.       Influenza A by PCR NEGATIVE NEGATIVE Final   Influenza B by PCR NEGATIVE NEGATIVE Final    Comment: (NOTE) The Xpert Xpress SARS-CoV-2/FLU/RSV plus assay is intended as an aid in the diagnosis of influenza from Nasopharyngeal swab specimens and should not be used as a sole basis for treatment. Nasal washings and aspirates are unacceptable for Xpert Xpress SARS-CoV-2/FLU/RSV testing.  Fact Sheet for Patients: EntrepreneurPulse.com.au  Fact Sheet for Healthcare Providers: IncredibleEmployment.be  This test is not yet approved or cleared by the [place] FDA and has been authorized for detection and/or diagnosis of SARS-CoV-2 by FDA under an Emergency Use Authorization (EUA). This EUA will remain in effect (meaning this test can be used) for the duration of the COVID-19 declaration under Section 564(b)(1) of the Act, 21 U.S.C. section 360bbb-3(b)(1), unless the authorization is terminated or revoked.  Performed at [facility], [place] [address], [place], [place] [address]     Radiology Studies: No results found.   LOS: 0 days   Antonieta Pert, MD [facility]  11/07/2020, 11:58 AM

## 2020-11-07 NOTE — Consult Note (Signed)
Neurology Consultation  Reason for Consult: Seizure 10/3 Referring Physician: Dr. Lupita Leash  CC: Patient is without complaints at this time  History is obtained from: Chart review, unable to contact emergency contacts via telephone, patient is a poor historian and provides very little detail regarding her history   HPI: Deanna Baldwin is a 45 y.o. 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 Krista Blue at [facility] for her [place] who presented to the ED 10/6 for evaluation of hypocalcemia. She had a recent hospitalization for severe hypercalcemia with overcorrection and hypocalcemia while hospitalized and was discharged once stabilized back to her facility on 9/25. She takes baclofen and tizanidine TID for her spasticity and was last seen outpatient on 05/06/2020. Family and facility staff reported that patient had seizure-like activity on 10/3 at her facility that lasted less than 5 minutes s/p Ativan treatment at her facility, however, she does not have a noted history of seizures and dose not have Ativan on her home medication list. Following the episode of seizure-like activity, patient was started on Keppra 500 mg BID by the NP at the facility.  Neurology NP attempted to call emergency contacts and facility for further collateral information but was unsuccessful. Nurse coordinator read staff notes regarding the incident indicating that there was witnessed seizure-like activity without further description of the activity witnessed. It was also noted that the patient reported a history of seizure-like activity but patient does not recall this. On assessment today patient denies any history of seizures and states "I don't think I was having a seizure".   NP was able to reach  facility staff after a second attempt. A facility nurse states that her CNA's described approximately 4 minutes of bilateral upper extremity jerking and flailing with patient foaming at the mouth. The nurse reports that following the seizure-like episode that patient was lethargic following this event and noted reported to staff that she had seizure-like activity before but had never had a full blown seizure. Staff notes that "patient has not been herself in quite a while". She notes that the patient had complained of left arm pain following the seizure-like activity for which they had x-ray imaging obtained without identified abnormality.   ROS: Unable to obtain due to altered mental status.   Past Medical History:  Diagnosis Date   Buttock wound 03/03/2016   Dysrhythmia    tachycardia   Hypercalcemia 10/14/2020   MS (multiple sclerosis) (HCC)    Neutropenia ([place])    Pneumonia 02/2016   Protein calorie malnutrition ([place])    Sacral osteomyelitis ([place]) 05/19/2017   Severe sepsis ([place]) 03/03/2016   Spastic 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 ([place])    UTI (urinary tract infection) 02/2016   Past Surgical History:  Procedure Laterality Date   DIVERTING ILEOSTOMY N/A 09/03/2016   Procedure: ILEAL CONDUIT  URINARY DIVERSION OPEN;  Surgeon: Ardis Hughs, MD;  Location: [facility];  Service: Urology;  Laterality: N/A;   Family History  Problem Relation Age of Onset   Diabetes Mellitus I Mother    Mental illness Sister    Social History:   reports that she quit smoking about 10 years ago. Her smoking use included cigarettes. She has a 10.00 pack-year smoking history. She has never used smokeless tobacco. She reports that she does not drink alcohol and does not  use drugs.  Medications  Current Facility-Administered Medications:    acetaminophen (TYLENOL) tablet 650 mg, 650 mg, Oral, Q6H PRN **OR** acetaminophen (TYLENOL) suppository 650 mg, 650 mg, Rectal, Q6H PRN, Kristopher Oppenheim, DO    acidophilus (RISAQUAD) capsule 1 capsule, 1 capsule, Oral, Daily, Kristopher Oppenheim, DO, 1 capsule at 11/07/20 1108   ascorbic acid (VITAMIN C) tablet 500 mg, 500 mg, Oral, q AM, Kristopher Oppenheim, DO, 500 mg at 11/07/20 0734   baclofen (LIORESAL) tablet 10 mg, 10 mg, Oral, TID, Kristopher Oppenheim, DO, 10 mg at 11/07/20 1107   bisacodyl (DULCOLAX) EC tablet 5 mg, 5 mg, Oral, Daily, Kristopher Oppenheim, DO, 5 mg at 11/07/20 1106   calcitRIOL (ROCALTROL) capsule 0.25 mcg, 0.25 mcg, Oral, BID, Kc, Ramesh, MD, 0.25 mcg at 11/07/20 1107   calcium carbonate (TUMS - dosed in mg elemental calcium) chewable tablet 400 mg of elemental calcium, 2 tablet, Oral, TID WC & HS, Kristopher Oppenheim, DO, 400 mg of elemental calcium at 11/07/20 1114   heparin injection 5,000 Units, 5,000 Units, Subcutaneous, Q8H, Kristopher Oppenheim, DO, 5,000 Units at 11/07/20 0513   levETIRAcetam (KEPPRA) tablet 500 mg, 500 mg, Oral, BID, Kristopher Oppenheim, DO, 500 mg at 11/07/20 1106   levothyroxine (SYNTHROID) tablet 100 mcg, 100 mcg, Oral, QAC breakfast, Kristopher Oppenheim, DO, 100 mcg at 11/07/20 9741   magnesium oxide (MAG-OX) tablet 400 mg, 400 mg, Oral, BID, Kristopher Oppenheim, DO, 400 mg at 11/07/20 1107   midodrine (PROAMATINE) tablet 2.5 mg, 2.5 mg, Oral, TID WC, Kristopher Oppenheim, DO, 2.5 mg at 11/07/20 1114   pantoprazole (PROTONIX) EC tablet 40 mg, 40 mg, Oral, Daily, Kristopher Oppenheim, DO, 40 mg at 11/07/20 1107   polyethylene glycol (MIRALAX / GLYCOLAX) packet 17 g, 17 g, Oral, Daily, Kristopher Oppenheim, DO, 17 g at 11/07/20 1108   tiZANidine (ZANAFLEX) tablet 2 mg, 2 mg, Oral, TID, Kristopher Oppenheim, DO, 2 mg at 11/07/20 1106  Current Outpatient Medications:    acetaminophen (TYLENOL) 325 MG tablet, Take 2 tablets (650 mg total) by mouth every 6 (six) hours as needed for mild pain (or Fever >/= 101)., Disp: , Rfl:    ascorbic acid (VITAMIN C) 500 MG tablet, Take 500 mg by mouth in the morning., Disp: , Rfl:    baclofen (LIORESAL) 10 MG tablet, Take 10 mg by mouth 3 (three) times daily., Disp: , Rfl:    bisacodyl  (DULCOLAX) 5 MG EC tablet, Take 5 mg by mouth daily., Disp: , Rfl:    CALCIUM 600-D 600-400 MG-UNIT TABS, Take 2 tablets by mouth 3 (three) times daily., Disp: , Rfl:    Cyanocobalamin (VITAMIN B-12 PO), Take 500 mcg by mouth daily., Disp: , Rfl:    ENSURE (ENSURE), Take 120 mLs by mouth 3 (three) times daily between meals. With meals. Vanilla, Disp: , Rfl:    ferrous sulfate 325 (65 FE) MG tablet, Take 325 mg by mouth daily with breakfast., Disp: , Rfl:    ibuprofen (ADVIL,MOTRIN) 200 MG tablet, Take 200 mg by mouth every 12 (twelve) hours as needed (for pain)., Disp: , Rfl:    KEPPRA 500 MG tablet, Take 500 mg by mouth in the morning and at bedtime., Disp: , Rfl:    levothyroxine (SYNTHROID) 100 MCG tablet, Take 100 mcg by mouth daily before breakfast., Disp: , Rfl:    magnesium hydroxide (MILK OF MAGNESIA) 400 MG/5ML suspension, Take 30 mLs by mouth every 12 (twelve) hours as needed for mild constipation., Disp: , Rfl:    midodrine (  PROAMATINE) 2.5 MG tablet, Take 1 tablet (2.5 mg total) by mouth 3 (three) times daily with meals., Disp: , Rfl:    Multiple Vitamin (MULTIVITAMIN) tablet, Take 1 tablet by mouth daily., Disp: , Rfl:    NON FORMULARY, Take 120 mLs by mouth See admin instructions. MedPass shake- Drink 120 ml's by mouth three times a day, Disp: , Rfl:    omeprazole (PRILOSEC) 20 MG capsule, Take 20 mg by mouth in the morning., Disp: , Rfl:    ondansetron (ZOFRAN) 4 MG tablet, Take 4 mg by mouth every 6 (six) hours as needed for nausea or vomiting., Disp: , Rfl:    polyethylene glycol (MIRALAX / GLYCOLAX) packet, Take 17 g by mouth See admin instructions. Mix 17 grams of powder into 4-8 ounces of water and drink every morning, Disp: , Rfl:    Probiotic Product (PROBIOTIC PO), Take 1 tablet by mouth daily., Disp: , Rfl:    PROTEIN PO, Take 30 mLs by mouth 2 (two) times daily., Disp: , Rfl:    SANTYL ointment, Apply 1 application topically See admin instructions. Apply to right heel  topically every day shift- clean with Dakin's, apply Santyl, and cover with normal saline gauze with foam dressing, Disp: , Rfl:    tiZANidine (ZANAFLEX) 2 MG tablet, Take 2 mg by mouth 3 (three) times daily., Disp: , Rfl:    Nutritional Supplements (NUTRITIONAL SUPPLEMENT PO), Regular Diet - Regular Texture (Patient not taking: Reported on 11/06/2020), Disp: , Rfl:    Nutritional Supplements (PROMOD) LIQD, Protein Liquid - give 30 ml by mouth two times daily (Patient not taking: Reported on 11/06/2020), Disp: , Rfl:   Exam: Current vital signs: BP 100/70   Pulse 86   Temp 98.1 F (36.7 C) (Oral)   Resp 14   SpO2 100%  Vital signs in last 24 hours: Temp:  [98.1 F (36.7 C)] 98.1 F (36.7 C) (10/06 1552) Pulse Rate:  [86-107] 86 (10/07 1330) Resp:  [13-26] 14 (10/07 1330) BP: (95-113)/(68-92) 100/70 (10/07 1330) SpO2:  [94 %-100 %] 100 % (10/07 1330)  GENERAL: Cachetic appearing female, awake, alert, in no acute distress Psych: Patient is calm and cooperative with examination Head: Normocephalic and atraumatic, without obvious abnormality EENT: Normal conjunctivae, dry mucous membranes, no OP obstruction LUNGS: Normal respiratory effort. Non-labored breathing on room air, SpO2 100% on telemetry CV: Regular rate and rhythm on telemetry ABDOMEN: Soft, non-tender, urostomy with urostomy bag in place RLQ Extremities: contracted lower extremities, no edema present  NEURO:  Mental Status: Awake, alert, and oriented to person, place, and time.  Good attention is noted.  Patient is a poor historian and is unable to provide details regarding history of present illness.  She states that she does not know what happened on Monday but that "something happened" and states "I don't think I had a seizure". She states that she thinks she was awake during the event. When asked what she believes happened she perseverates on "I don't think I had a seizure" Speech/Language: speech is intact without  dysarthria.   Repetition, fluency, and comprehension intact however patient is unable to name objects. She is unable to name objects but appears to struggle with seeing objects even right in front of her face (previously charted vision 20/300 OU) No neglect is noted. Cranial Nerves:  II: PERRL 4 mm/brisk.  III, IV, VI: Patient is able to orient to voice in all visual fields and look in all directions to command but she is unable  to fixate and track on objects. She has persistent rotational nystagmus. Previously charted vision is 20/300 OU. V: Sensation is intact to light touch and symmetrical to face. Blinks to threat throughout.  VII: Face is symmetric resting and smiling.  VIII: Hearing is intact to voice IX, X: Palate elevation is symmetric. Phonation normal.  XI: Normal sternocleidomastoid and trapezius muscle strength XII: Tongue protrudes midline without fasciculations.   Motor: Spastic paraparesis present with contractures of BLE. Right upper extremity has greater movement than the left upper extremity.  RUE is able to elevate slightly against gravity with 3/5 strength, left upper extremity is unable to overcome gravity with increased tone and pain with passive ROM.  Left grip 3/5, right grip 4-/5. Bulk is significantly decreased throughout.  Sensation: Intact to light touch bilaterally in all four extremities. Coordination: Unable to assess due to patient with spastic paraparesis.  DTRs: 1+ patellae, 1+ brachioradialis, limited by contractures Gait: Deferred, patient is wheelchair bound with spastic paraparesis at baseline  On later MD examination, patient was oriented to person and time but not place and had poor attention.  She was unable to name objects even when tactile stimulation was used (asking her to name her thumb or her pinky finger)  Labs I have reviewed labs in epic and the results pertinent to this consultation are: CBC    Component Value Date/Time   WBC 9.5  11/06/2020 1707   RBC 3.57 (L) 11/06/2020 1707   HGB 10.3 (L) 11/06/2020 1707   HGB 9.3 (L) 01/19/2018 1145   HCT 33.6 (L) 11/06/2020 1707   HCT 28.6 (L) 01/19/2018 1145   PLT 653 (H) 11/06/2020 1707   PLT 576 (H) 01/19/2018 1145   MCV 94.1 11/06/2020 1707   MCV 95 01/19/2018 1145   MCH 28.9 11/06/2020 1707   MCHC 30.7 11/06/2020 1707   RDW 14.4 11/06/2020 1707   RDW 12.0 (L) 01/19/2018 1145   LYMPHSABS 0.9 11/06/2020 1707   LYMPHSABS 1.2 01/19/2018 1145   MONOABS 0.5 11/06/2020 1707   EOSABS 0.2 11/06/2020 1707   EOSABS 0.3 01/19/2018 1145   BASOSABS 0.1 11/06/2020 1707   BASOSABS 0.1 01/19/2018 1145   CMP     Component Value Date/Time   NA 145 11/07/2020 0500   NA 144 01/19/2018 1145   K 3.7 11/07/2020 0500   CL 109 11/07/2020 0500   CO2 17 (L) 11/07/2020 0500   GLUCOSE 87 11/07/2020 0500   BUN 17 11/07/2020 0500   BUN 27 (H) 01/19/2018 1145   CREATININE 0.69 11/07/2020 0500   CREATININE 0.83 07/13/2017 1135   CALCIUM 4.3 (LL) 11/07/2020 0500   CALCIUM 6.3 (LL) 04/09/2017 0912   PROT 7.2 11/07/2020 0500   PROT 6.7 01/19/2018 1145   ALBUMIN 2.8 (L) 11/07/2020 0500   ALBUMIN 4.1 01/19/2018 1145   AST 17 11/07/2020 0500   ALT 11 11/07/2020 0500   ALKPHOS 135 (H) 11/07/2020 0500   BILITOT 0.5 11/07/2020 0500   BILITOT <0.2 01/19/2018 1145   GFRNONAA >60 11/07/2020 0500   GFRNONAA 88 07/13/2017 1135   GFRAA 125 01/19/2018 1145   GFRAA 102 07/13/2017 1135   Lipid Panel     Component Value Date/Time   CHOL 178 09/29/2017 0000   TRIG 83 09/29/2017 0000   HDL 47 09/29/2017 0000   LDLCALC 115 09/29/2017 0000   Urinalysis    Component Value Date/Time   COLORURINE YELLOW 10/17/2020 2148   APPEARANCEUR HAZY (A) 10/17/2020 2148   LABSPEC 1.008 10/17/2020 2148  PHURINE 7.0 10/17/2020 2148   GLUCOSEU NEGATIVE 10/17/2020 2148   HGBUR SMALL (A) 10/17/2020 2148   BILIRUBINUR NEGATIVE 10/17/2020 2148   KETONESUR 5 (A) 10/17/2020 2148   PROTEINUR NEGATIVE  10/17/2020 2148   UROBILINOGEN 0.2 06/25/2008 1551   NITRITE POSITIVE (A) 10/17/2020 2148   LEUKOCYTESUR SMALL (A) 10/17/2020 2148   Drugs of Abuse  No results found for: LABOPIA, COCAINSCRNUR, LABBENZ, AMPHETMU, THCU, LABBARB   Imaging I have reviewed the images obtained:  MRI examination of the brain pending  MRI brain 07/15/2016 personally reviewed by attending MD, agree with radiology: 1.   Multiple infratentorial and hemispheric T2/FLAIR hyperintense foci consistent with chronic demyelinating plaque associated with multiple sclerosis. None of the foci appeared to be acute. 2.   Moderate cortical atrophy and corpus callosum atrophy with mild brainstem atrophy. 3.   There are no acute findings.  Assessment: 45 y.o. female who presented to the ED 10/6 due to lab abnormalities identified at patient's facility with reports of seizure-like activity 10/3. Unable to obtain further collateral information other than 1 mg IM lorazepam administered for seizure-like activity lasting < 5 minutes. Neurology consulted for further evaluation. Inpatient work up reveals patient with hypocalcemia at 4.3, ionized calcium of 0.38, corrected calcium of 5.3 and hypomagnesemia with mag of 1.5.  - Examination reveals cachetic patient that is a poor historian with spastic paraparesis as per baseline with contracted lower extremities and right > left upper extremity strength and mobility. She is unable to provide further details regarding reported seizure-like activity that occurred on 10/3 but states "I don't think it was a seizure".  - MRI brain imaging has not been completed since 2018, will repeat MRI brain with and without contrast along with a routine EEG.  - Patient's acute metabolic encephalopathy may be 2/2 metabolic derangements present on hospital arrival. The reported seizure-like activity involved patient with jerking and flailing of the arms with foaming at the mouth for approximately 4 minutes with  subsequent lethargy. Description of events is concerning for a provoked seizure in the setting of severe metabolic derangements on previous and current hospital presentation.  Initially, planned to hold Keppra and continue observation, but given the additional history obtained by NP and patient at high risk for seizures given her MS as well as ongoing difficult to correct electrolyte derangements, will resume this medication  Impression: Secondary progressive multiple sclerosis Severe electrolyte derangements complicated by delirium Possible provoked seizures in a patient at high risk for developing epilepsy  Recommendations: - Routine EEG completed on our recommendation, read pending - MRI brain wwo contrast was attempted and failed due to patient's mental status, should be reattempted pending improvement in mental status - Keppra 750 mg once followed by 500 mg twice daily - Seizure precautions - Midazolam 2 mg IV PRN any seizure lasting > 5 minutes and notify neurology (critical shortage of Ativan) - Appreciate management of metabolic derangements per primary team; attending did increase monitoring to every 12 hours to allow for aggressive repletion - Neurology will continue to follow  Pt seen by NP/Neuro and later by MD. Note/plan to be edited by MD as needed.  Anibal Henderson, AGAC-NP [facility] Pager: [phone]  Attending Neurologist's note:  I personally saw this patient, gathering history, performing a full neurologic examination, reviewing relevant labs, personally reviewing relevant imaging including MRI brain, and formulated the assessment and plan, adding the note above for completeness and clarity to accurately reflect my thoughts  Lesleigh Noe MD-PhD [facility]  [phone]  Available 7 AM to 7 PM, outside these hours please contact Neurologist on call listed on AMION

## 2020-11-07 NOTE — ED Notes (Signed)
Breakfast Ordered 

## 2020-11-08 DIAGNOSIS — R569 Unspecified convulsions: Secondary | ICD-10-CM | POA: Diagnosis not present

## 2020-11-08 LAB — BASIC METABOLIC PANEL
Anion gap: 12 (ref 5–15)
Anion gap: 11 (ref 5–15)
BUN: 15 mg/dL (ref 6–20)
BUN: 15 mg/dL (ref 6–20)
CO2: 23 mmol/L (ref 22–32)
CO2: 24 mmol/L (ref 22–32)
Calcium: 6.9 mg/dL — ABNORMAL LOW (ref 8.9–10.3)
Calcium: 6.5 mg/dL — ABNORMAL LOW (ref 8.9–10.3)
Chloride: 105 mmol/L (ref 98–111)
Chloride: 108 mmol/L (ref 98–111)
Creatinine, Ser: 0.69 mg/dL (ref 0.44–1.00)
Creatinine, Ser: 0.57 mg/dL (ref 0.44–1.00)
GFR, Estimated: >60 mL/min (ref >60)
GFR, Estimated: >60 mL/min (ref >60)
Glucose, Bld: 144 mg/dL — ABNORMAL HIGH (ref 70–99)
Glucose, Bld: 119 mg/dL — ABNORMAL HIGH (ref 70–99)
Potassium: 4.1 mmol/L (ref 3.5–5.1)
Potassium: 4.5 mmol/L (ref 3.5–5.1)
Sodium: 140 mmol/L (ref 135–145)
Sodium: 143 mmol/L (ref 135–145)

## 2020-11-08 LAB — HEPATIC FUNCTION PANEL
ALT: 10 U/L (ref 0–44)
AST: 13 U/L — ABNORMAL LOW (ref 15–41)
Albumin: 2.3 g/dL — ABNORMAL LOW (ref 3.5–5.0)
Alkaline Phosphatase: 116 U/L (ref 38–126)
Bilirubin, Direct: <0.1 mg/dL (ref 0.0–0.2)
Total Bilirubin: 0.2 mg/dL — ABNORMAL LOW (ref 0.3–1.2)
Total Protein: 6.5 g/dL (ref 6.5–8.1)

## 2020-11-08 LAB — MAGNESIUM
Magnesium: 1.9 mg/dL (ref 1.7–2.4)
Magnesium: 1.5 mg/dL — ABNORMAL LOW (ref 1.7–2.4)

## 2020-11-08 LAB — PHOSPHORUS
Phosphorus: 5.5 mg/dL — ABNORMAL HIGH (ref 2.5–4.6)
Phosphorus: 5.5 mg/dL — ABNORMAL HIGH (ref 2.5–4.6)

## 2020-11-08 LAB — CALCIUM, IONIZED: Calcium, Ionized, Serum: 3.1 mg/dL — ABNORMAL LOW (ref 4.5–5.6)

## 2020-11-08 MED ORDER — SODIUM CHLORIDE 0.9 % IV SOLN
INTRAVENOUS | Status: DC | PRN
  Administered 2020-11-08: 10 mL/h via INTRAVENOUS

## 2020-11-08 MED ORDER — CALCIUM CARBONATE ANTACID 500 MG PO CHEW
3.0000 | CHEWABLE_TABLET | Freq: Three times a day (TID) | ORAL | Status: DC
  Administered 2020-11-08 – 2020-11-12 (×15): 600 mg via ORAL
  Filled 2020-11-08 (×15): qty 3

## 2020-11-08 MED ORDER — MIDAZOLAM HCL 2 MG/2ML IJ SOLN: 1.0000 mg | INTRAMUSCULAR | Status: DC | PRN

## 2020-11-08 MED ORDER — MAGNESIUM SULFATE 2 GM/50ML IV SOLN
2.0000 g | Freq: Once | INTRAVENOUS | Status: AC
  Administered 2020-11-08: 2 g via INTRAVENOUS
  Filled 2020-11-08: qty 50

## 2020-11-08 MED ORDER — LACTATED RINGERS IV BOLUS
500.0000 mL | Freq: Once | INTRAVENOUS | Status: AC
  Administered 2020-11-08: 500 mL via INTRAVENOUS

## 2020-11-08 NOTE — Progress Notes (Signed)
Medications given after breakfast, breakfast was late. Patient refused miralax. Taking meds with water and assisted to eat by NT.

## 2020-11-08 NOTE — Progress Notes (Signed)
Neurology Progress Note  Subjective: Patient with improvement in mental status today No acute overnight events  Exam: Vitals:   11/08/20 0530 11/08/20 1100  BP: 95/70 105/70  Pulse: 88 (!) 118  Resp: 18 18  Temp: 98.2 F (36.8 C) 98.7 F (37.1 C)  SpO2: 98%    Gen: Awake, laying in bed, in no acute distress Resp: non-labored breathing, no respiratory distress Abd: soft, non-tender, non-distended  Neuro: Mental Status: Awake, alert, oriented to person, place, age, and month. She incorrectly states that the year is 2023 and states that she does not know why she is in the hospital.  She states she recalls being told that she may have had a seizure at her facility and states "yeah I remember that, what happened?" She follows commands without difficulty Her speech is intact without dysarthria She is able to name her thumb and little finger but is unable to name objects that examiner is holding due to impaired vision (chronic).  There are no signs of aphasia or neglect.  Cranial Nerves: PERRL, EOMI and she orients to voice, facial sensation is intact and symmetric to light touch, face is symmetric resting and smiling, hearing is intact to voice, phonation is normal, palate elevates symmetrically, tongue protrudes midline.  Motor: Spastic paraparesis present with contractures of BLE. RUE has greater movement than the LUE. RUE is able to withstand antigravity movement without vertical drift. LUE is with minimal antigravity movement.  Increased tone present in BLE and LUE.  Left grip 3/5, right grip 4-/5. Bulk is significantly decreased throughout.  Sensory: Intact and symmetric to light touch throughout Gait: Deferred, patient is wheelchair bound and nonambulatory at baseline  Pertinent Labs:  Basic Metabolic Panel: Recent Labs  Lab 11/06/20 1707 11/07/20 0500 11/07/20 2207 11/08/20 0226 11/08/20 1629  NA 144 145 142 140 143  K 3.6 3.7 3.7 4.1 4.5  CL 106 109 106 105 108   CO2 22 17* 23 23 24   GLUCOSE 99 87 148* 144* 119*  BUN 19 17 15 15 15   CREATININE 0.60 0.69 0.69 0.69 0.57  CALCIUM 4.1* 4.3* 6.9* 6.9* 6.5*  MG 1.1* 1.5* 2.4 1.9 1.5*  PHOS 8.3*  --  5.9* 5.5* 5.5*    CBC: Recent Labs  Lab 11/06/20 1700 11/06/20 1707  WBC  --  9.5  NEUTROABS  --  7.6  HGB 12.9 10.3*  HCT 38.0 33.6*  MCV  --  94.1  PLT  --  653*    Coagulation Studies: No results for input(s): LABPROT, INR in the last 72 hours.    Imaging Reviewed:  MRI brain pending  MRI brain 07/15/2016 personally reviewed by attending MD, agree with radiology: Mutiple infratentorial and hemispheric T2/FLAIR hyperintense foci consistent with chronic demyelinating plaque associated with multiple sclerosis.  None of the foci appeared to be acute. Moderate cortical atrophy and corpus callosum atrophy with mild brainstem atrophy. There are no acute findings.  EEG 11/07/2020: This study is within normal limits. No seizures or epileptiform discharges were seen throughout the recording.  Assessment: 45 y.o. female who presented to the ED 10/6 due to lab abnormalities identified at patient's facility with reports of seizure-like activity 10/3 described as 4 minutes of phone and drinking in the arms with foaming at the mouth s/p 1 mg IM Ativan administration and initiation of Keppra 500 mg twice daily.  Initial inpatient work-up reveals patient with hypocalcemia at 4.3, ionized calcium of 0.38, corrected calcium of 5.3 hypomagnesemia at 1.5. -Examination improved  today.  Patient continues to have spastic paraparesis with contracted lower extremities as baseline and right greater than left upper extremity strength and mobility. - MRI brain imaging has not been completed since 2018, pending repeat imaging.  Was reported earlier today that patient was refusing MRI imaging but she is agreeable NP evaluation. - Patient's acute metabolic encephalopathy may be secondary to metabolic derangement present on  hospital arrival.  Reported seizure-like activity involved patient with jerking and pulling of the arms with foaming at the mouth for approximately 4 minutes with subsequent lethargy.  Description minutes is concerning for provoked seizure in the setting of severe metabolic derangements on previous and current hospital presentation.  Initially, plan to hold Keppra and continued observation but given the additional history patient is considered high risk for seizures given her MS as well as ongoing difficulty with electrolyte derangements.  We will continue Keppra at this time and would like to continue it on an outpatient basis temporarily until her metabolic abnormalities normalize.   Impression Secondary progressive multiple sclerosis Severe electrolyte derangements complicated by delirium Possible seizures in a patient at high risk for developing epilepsy  Recommendations: - MRI brain w/wo still pending pending, sedation changed to ativan instead of versed per hospital safety protocols which override hospital Ativan conservation protocols in the setting of shortage - Continue seizure precautions - Midazolam 2 mg IV PRN any seizure lasting > 5 minutes and notify neurology (critical shortage of Ativan) - Appreciate management of metabolic derangements per primary team; attending did increase monitoring to every 12 hours to allow for aggressive repletion - Will need to follow closely with outpatient neurology for further AED management  - Neurology will follow up on MRI brain imaging   Anibal Henderson, AGACNP-BC [facility] [phone]  Attending Neurologist's note:  I personally saw this patient, gathering history, performing a neurologic examination, reviewing relevant labs, and formulated the assessment and plan, adding the note above for completeness and clarity to accurately reflect my thoughts  Lesleigh Noe MD-PhD [facility] [phone]  Available 7 AM to 7  PM, outside these hours please contact Neurologist on call listed on AMION

## 2020-11-08 NOTE — Progress Notes (Signed)
PROGRESS NOTE    Deanna Baldwin  DJM:[id number] DOB: [birth year] DOA: 11/06/2020 PCP: [name], MD   Chief Complaint  Patient presents with   abnormal labs   Brief Narrative/Hospital Course: 45 old female with history of severe MS with spastic paraplegia contracture, multiple decubitus ulcer over sacral area, protein calorie moderation hypothyroidism recent admission for severe hypercalcemia due to dehydration treated with calcitonin and pamidronate subsequently had hypo calcium and that was aggressively repleted and discharged to skilled nursing facility/nursing home returns to the ED with abnormal labs with low calcium. In the ED patient was asymptomatic, vitals stable labs showed calcium 4.111 3.0 mag 1.1 phosphorus 8.3 ionized calcium 0.38 given 1 g calcium calcium gluconate magnesium and admitted Patient further received IV magnesium and calcium discussion nephrologist  Subjective:  Seen examined this morning alert awake no new complaints at baseline. Did not get MRI last night -now agreeable after explaining   Assessment & Plan:  Hypocalcemia:Multifactorial suspect due to recent use of pamidronate for hypercalcemia. So far IV calcium gluconate 3 g.  Corrected calcium improved 8.3 Citriol increased to twice daily and Tums 4 times a day-as per discussion with nephrology [name].BMP in a.m. ionizied calcium pending   hypomagnesemia: Resolved.  Continue Mag-Ox   Mild metabolic acidosis resolved.    Secondary progressive multiple sclerosis with spastic paraplegia Chronic pain: Continue supportive care muscle relaxant baclofen/Zanaflex  Episode of seizure at NH on 11/03/20-informed by facility nursing staff/sister  on 10/7-possible provoked seizure at risk of developing epilepsy. Neurology has been consulted EEG unremarkable.  MRI brain with and without contrast pending-continue complete yesterday due to her mental status, will need to reattempt SCDs more compliant  today.Patient started on Keppra by neurology.  Monitor lites and replete  Decubitus ulcer of sacral region, unstageable POA continue routine wound care offloading.  Severe protein calorie malnutrition: Patient reports she has "high metabolism" can she can eat orally and not gain weight.  Continue to augment diet as per dietitian.  Chronic constipation supportive care laxatives as needed  Hypotension on midodrine.  Systolic blood pressure at times ranges from 80s to 90s, also gets tachycardic which is not new for her.  Hypothyroidism continue Synthroid   Diet Order             Diet regular Room service appropriate? Yes; Fluid consistency: Thin  Diet effective now                   DVT prophylaxis: heparin injection 5,000 Units Start: 11/07/20 0045 Foot Pump / plexipulse Start: 11/07/20 0040 Code Status:   Code Status: Full Code  Family Communication: plan of care discussed with patient at bedside. Status is: admited Observation  Patient remains hospitalized will need at least 2 midnight care for management of severe hypocalcemia needing IV replacement Dispo: The patient is from:  [facility] home              Anticipated d/c is to:  [facility] home              Patient currently is not medically stable to d/c.   Difficult to place patient No  Objective: Vitals: Today's Vitals   11/08/20 0030 11/08/20 0258 11/08/20 0428 11/08/20 0530  BP: 103/72 101/67 94/66 95/70   Pulse: (!) 111 (!) 103 95 88  Resp: 15 (!) 24 (!) 25 18  Temp: 99.2 F (37.3 C) 98.8 F (37.1 C) 98.1 F (36.7 C) 98.2 F (36.8 C)  TempSrc:  Oral Oral Oral Oral  SpO2: 98% 97% 98% 98%  Weight:    41.7 kg  PainSc:       Physical Examination: General exam: AAO, seems to be at baseline, thin  much older than stated age, weak appearing. HEENT:Oral mucosa moist, Ear/Nose WNL grossly, dentition normal. Respiratory system: bilaterally clear breath sounds, no use of accessory  muscle Cardiovascular system: S1 & S2 +, No JVD,. Gastrointestinal system: Abdomen soft, urostomy bag in place NT,ND, BS+ Nervous System:Alert, awake, moving extremities and grossly nonfocal Extremities: no edema, contractures in lower extremities, distal peripheral pulses palpable.  Skin: No rashes,no icterus. MSK: small muscle bulk,tone, power .  Medications reviewed:  Scheduled Meds:  acidophilus  1 capsule Oral Daily   ascorbic acid  500 mg Oral q AM   baclofen  10 mg Oral TID   bisacodyl  5 mg Oral Daily   calcitRIOL  0.25 mcg Oral BID   calcium carbonate  2 tablet Oral TID WC & HS   heparin  5,000 Units Subcutaneous Q8H   levothyroxine  100 mcg Oral QAC breakfast   magnesium oxide  400 mg Oral BID   midodrine  2.5 mg Oral TID WC   pantoprazole  40 mg Oral Daily   polyethylene glycol  17 g Oral Daily   tiZANidine  2 mg Oral TID   Continuous Infusions:  levETIRAcetam      Intake/Output  Intake/Output Summary (Last 24 hours) at 11/08/2020 1127 Last data filed at 11/07/2020 2100 Gross per 24 hour  Intake --  Output 700 ml  Net -700 ml    Intake/Output from previous day: 10/07 0701 - 10/08 0700 In: -  Out: 700 [Urine:700] Net IO Since Admission: 400 mL [11/08/20 1127]   Weight change:   Wt Readings from Last 3 Encounters:  11/08/20 41.7 kg  10/24/20 42.7 kg  09/28/18 43.1 kg     Consultants:see note  Procedures:see note Antimicrobials: Anti-infectives (From admission, onward)    None      Culture/Microbiology    Component Value Date/Time   SDES BLOOD RIGHT ANTECUBITAL 10/18/2020 0254   SDES BLOOD RIGHT HAND 10/18/2020 0254   SPECREQUEST  10/18/2020 0254    BOTTLES DRAWN AEROBIC AND ANAEROBIC Blood Culture adequate volume   SPECREQUEST  10/18/2020 0254    BOTTLES DRAWN AEROBIC AND ANAEROBIC Blood Culture adequate volume   CULT  10/18/2020 0254    NO GROWTH 5 DAYS Performed at [facility], [place] [address], [place], [place] [address]    CULT   10/18/2020 0254    NO GROWTH 5 DAYS Performed at [facility], [place] [address], [place], [place] [address]    REPTSTATUS 10/23/2020 FINAL 10/18/2020 0254   REPTSTATUS 10/23/2020 FINAL 10/18/2020 0254    Other culture-see note  Unresulted Labs (From admission, onward)     Start     Ordered   11/08/20 5974  Basic metabolic panel  5A & 5P,   R (with TIMED occurrences)     Question:  Specimen collection method  Answer:  Lab=Lab collect   11/07/20 2148   11/08/20 0500  Hepatic function panel  Daily,   R     Question:  Specimen collection method  Answer:  Lab=Lab collect   11/07/20 2148   11/07/20 2149  calcium, ionized  (calcium gluconate and labs)  5A & 5P,   R (with TIMED occurrences)     Question:  Specimen collection method  Answer:  Lab=Lab collect  11/07/20 2148   11/07/20 2149  Magnesium  5A & 5P,   R (with TIMED occurrences)     Question:  Specimen collection method  Answer:  Lab=Lab collect   11/07/20 2148   11/07/20 2149  Phosphorus  5A & 5P,   R (with TIMED occurrences)     Question:  Specimen collection method  Answer:  Lab=Lab collect   11/07/20 2148   11/07/20 1208  calcium, ionized  (calcium gluconate and labs)  Once-Timed,   TIMED        11/07/20 0909   11/07/20 0500  Calcium, ionized  Tomorrow morning,   R        11/07/20 0039            Data Reviewed: I have personally reviewed following labs and imaging studies CBC: Recent Labs  Lab 11/06/20 1700 11/06/20 1707  WBC  --  9.5  NEUTROABS  --  7.6  HGB 12.9 10.3*  HCT 38.0 33.6*  MCV  --  94.1  PLT  --  653*    Basic Metabolic Panel: Recent Labs  Lab 11/06/20 1700 11/06/20 1707 11/07/20 0500 11/07/20 2207 11/08/20 0226  NA 142 144 145 142 140  K 4.0 3.6 3.7 3.7 4.1  CL 110 106 109 106 105  CO2  --  22 17* 23 23  GLUCOSE 103* 99 87 148* 144*  BUN 20 19 17 15 15   CREATININE 0.50 0.60 0.69 0.69 0.69  CALCIUM  --  4.1* 4.3* 6.9* 6.9*  MG  --  1.1* 1.5* 2.4 1.9  PHOS  --  8.3*  --  5.9*  5.5*    GFR: Estimated Creatinine Clearance: 59.1 mL/min (by C-G formula based on SCr of 0.69 mg/dL). Liver Function Tests: Recent Labs  Lab 11/06/20 1707 11/07/20 0500 11/08/20 0226  AST 19 17 13*  ALT 10 11 10   ALKPHOS 142* 135* 116  BILITOT 0.2* 0.5 0.2*  PROT 7.8 7.2 6.5  ALBUMIN 3.0* 2.8* 2.3*    No results for input(s): LIPASE, AMYLASE in the last 168 hours. No results for input(s): AMMONIA in the last 168 hours. Coagulation Profile: No results for input(s): INR, PROTIME in the last 168 hours. Cardiac Enzymes: No results for input(s): CKTOTAL, CKMB, CKMBINDEX, TROPONINI in the last 168 hours. BNP (last 3 results) No results for input(s): PROBNP in the last 8760 hours. HbA1C: No results for input(s): HGBA1C in the last 72 hours. CBG: No results for input(s): GLUCAP in the last 168 hours. Lipid Profile: No results for input(s): CHOL, HDL, LDLCALC, TRIG, CHOLHDL, LDLDIRECT in the last 72 hours. Thyroid Function Tests: Recent Labs    11/06/20 1707  TSH 3.839  FREET4 1.22*    Anemia Panel: No results for input(s): VITAMINB12, FOLATE, FERRITIN, TIBC, IRON, RETICCTPCT in the last 72 hours. Sepsis Labs: No results for input(s): PROCALCITON, LATICACIDVEN in the last 168 hours.  Recent Results (from the past 240 hour(s))  Resp Panel by RT-PCR (Flu A&B, Covid) Nasopharyngeal Swab     Status: None   Collection Time: 11/07/20  1:27 AM   Specimen: Nasopharyngeal Swab; Nasopharyngeal(NP) swabs in vial transport medium  Result Value Ref Range Status   SARS Coronavirus 2 by RT PCR NEGATIVE NEGATIVE Final    Comment: (NOTE) SARS-CoV-2 target nucleic acids are NOT DETECTED.  The SARS-CoV-2 RNA is generally detectable in upper respiratory specimens during the acute phase of infection. The lowest concentration of SARS-CoV-2 viral copies this assay can detect is 138 copies/mL.  A negative result does not preclude SARS-Cov-2 infection and should not be used as the sole basis  for treatment or other patient management decisions. A negative result may occur with  improper specimen collection/handling, submission of specimen other than nasopharyngeal swab, presence of viral mutation(s) within the areas targeted by this assay, and inadequate number of viral copies(<138 copies/mL). A negative result must be combined with clinical observations, patient history, and epidemiological information. The expected result is Negative.  Fact Sheet for Patients:  EntrepreneurPulse.com.au  Fact Sheet for Healthcare Providers:  IncredibleEmployment.be  This test is no t yet approved or cleared by the [place] FDA and  has been authorized for detection and/or diagnosis of SARS-CoV-2 by FDA under an Emergency Use Authorization (EUA). This EUA will remain  in effect (meaning this test can be used) for the duration of the COVID-19 declaration under Section 564(b)(1) of the Act, 21 U.S.C.section 360bbb-3(b)(1), unless the authorization is terminated  or revoked sooner.       Influenza A by PCR NEGATIVE NEGATIVE Final   Influenza B by PCR NEGATIVE NEGATIVE Final    Comment: (NOTE) The Xpert Xpress SARS-CoV-2/FLU/RSV plus assay is intended as an aid in the diagnosis of influenza from Nasopharyngeal swab specimens and should not be used as a sole basis for treatment. Nasal washings and aspirates are unacceptable for Xpert Xpress SARS-CoV-2/FLU/RSV testing.  Fact Sheet for Patients: EntrepreneurPulse.com.au  Fact Sheet for Healthcare Providers: IncredibleEmployment.be  This test is not yet approved or cleared by the [place] FDA and has been authorized for detection and/or diagnosis of SARS-CoV-2 by FDA under an Emergency Use Authorization (EUA). This EUA will remain in effect (meaning this test can be used) for the duration of the COVID-19 declaration under Section 564(b)(1) of the Act, 21  U.S.C. section 360bbb-3(b)(1), unless the authorization is terminated or revoked.  Performed at [facility], [place] [address], [place], [place] [address]      Radiology Studies: EEG adult  Result Date: 11/18/20 Lora Havens, MD     18-Nov-2020  7:44 PM Patient Name: Deanna Baldwin MRN: [id number] Epilepsy Attending: Lora Havens Referring Physician/Provider: Anibal Henderson, NP Date: 11/18/20 Duration: 22.48 mins Patient history: 45 y.o. female who presented to the ED 10/6 due to lab abnormalities identified at patient's facility with reports of seizure-like activity 10/3. Unable to obtain further collateral information other than 1 mg IM lorazepam administered for seizure-like activity lasting < 5 minutes. EEG to evaluate for seizure Level of alertness: Awake AEDs during EEG study: LEV Technical aspects: This EEG study was done with scalp electrodes positioned according to the 10-20 International system of electrode placement. Electrical activity was acquired at a sampling rate of 500Hz  and reviewed with a high frequency filter of 70Hz  and a low frequency filter of 1Hz . EEG data were recorded continuously and digitally stored. Description: The posterior dominant rhythm consists of 8Hz  activity of moderate voltage (25-35 uV) seen predominantly in posterior head regions, symmetric and reactive to eye opening and eye closing. Hyperventilation and photic stimulation were not performed.   IMPRESSION: This study is within normal limits. No seizures or epileptiform discharges were seen throughout the recording. Lora Havens     LOS: 1 day   Antonieta Pert, MD [facility]  11/08/2020, 11:27 AM

## 2020-11-08 NOTE — Progress Notes (Signed)
   11/08/20 0030  Assess: MEWS Score  Temp 99.2 F (37.3 C)  BP 103/72  Pulse Rate (!) 111  ECG Heart Rate (!) 112  Resp 15  SpO2 98 %  O2 Device Room Air  Assess: MEWS Score  MEWS Temp 0  MEWS Systolic 0  MEWS Pulse 2  MEWS RR 0  MEWS LOC 0  MEWS Score 2  MEWS Score Color Yellow  Assess: if the MEWS score is Yellow or Red  Were vital signs taken at a resting state? Yes  Focused Assessment Change from prior assessment (see assessment flowsheet)  Early Detection of Sepsis Score *See Row Information* Low  MEWS guidelines implemented *See Row Information* Yes  Take Vital Signs  Increase Vital Sign Frequency  Yellow: Q 2hr X 2 then Q 4hr X 2, if remains yellow, continue Q 4hrs  Escalate  MEWS: Escalate Yellow: discuss with charge nurse/RN and consider discussing with provider and RRT  Notify: Charge Nurse/RN  Name of Charge Nurse/RN Notified Heather, RN  Date Charge Nurse/RN Notified 11/08/20  Time Charge Nurse/RN Notified 0040  Notify: Provider  Provider Name/Title Chotiner, MD  Date Provider Notified 11/08/20  Time Provider Notified 0040  Notification Type Page  Notification Reason Change in status  Provider response See new orders  Date of Provider Response 11/08/20  Time of Provider Response 0045  Document  Patient Outcome Stabilized after interventions  Progress note created (see row info) Yes

## 2020-11-09 ENCOUNTER — Inpatient Hospital Stay (HOSPITAL_COMMUNITY): Payer: Medicare (Managed Care)

## 2020-11-09 DIAGNOSIS — R569 Unspecified convulsions: Secondary | ICD-10-CM | POA: Diagnosis not present

## 2020-11-09 LAB — CALCIUM, IONIZED
Calcium, Ionized, Serum: <3 mg/dL — ABNORMAL LOW (ref 4.5–5.6)
Calcium, Ionized, Serum: 3.9 mg/dL — ABNORMAL LOW (ref 4.5–5.6)
Calcium, Ionized, Serum: 3.8 mg/dL — ABNORMAL LOW (ref 4.5–5.6)
Calcium, Ionized, Serum: 3.7 mg/dL — ABNORMAL LOW (ref 4.5–5.6)

## 2020-11-09 LAB — BASIC METABOLIC PANEL
Anion gap: 12 (ref 5–15)
Anion gap: 13 (ref 5–15)
BUN: 17 mg/dL (ref 6–20)
BUN: 17 mg/dL (ref 6–20)
CO2: 27 mmol/L (ref 22–32)
CO2: 26 mmol/L (ref 22–32)
Calcium: 7.9 mg/dL — ABNORMAL LOW (ref 8.9–10.3)
Calcium: 7.2 mg/dL — ABNORMAL LOW (ref 8.9–10.3)
Chloride: 105 mmol/L (ref 98–111)
Chloride: 102 mmol/L (ref 98–111)
Creatinine, Ser: 0.57 mg/dL (ref 0.44–1.00)
Creatinine, Ser: 0.66 mg/dL (ref 0.44–1.00)
GFR, Estimated: >60 mL/min (ref >60)
GFR, Estimated: >60 mL/min (ref >60)
Glucose, Bld: 98 mg/dL (ref 70–99)
Glucose, Bld: 110 mg/dL — ABNORMAL HIGH (ref 70–99)
Potassium: 5 mmol/L (ref 3.5–5.1)
Potassium: 5 mmol/L (ref 3.5–5.1)
Sodium: 144 mmol/L (ref 135–145)
Sodium: 141 mmol/L (ref 135–145)

## 2020-11-09 LAB — HEPATIC FUNCTION PANEL
ALT: 9 U/L (ref 0–44)
AST: 16 U/L (ref 15–41)
Albumin: 2.5 g/dL — ABNORMAL LOW (ref 3.5–5.0)
Alkaline Phosphatase: 106 U/L (ref 38–126)
Bilirubin, Direct: <0.1 mg/dL (ref 0.0–0.2)
Total Bilirubin: 0.2 mg/dL — ABNORMAL LOW (ref 0.3–1.2)
Total Protein: 6.7 g/dL (ref 6.5–8.1)

## 2020-11-09 LAB — PHOSPHORUS
Phosphorus: 6 mg/dL — ABNORMAL HIGH (ref 2.5–4.6)
Phosphorus: 5.1 mg/dL — ABNORMAL HIGH (ref 2.5–4.6)

## 2020-11-09 LAB — MAGNESIUM
Magnesium: 2 mg/dL (ref 1.7–2.4)
Magnesium: 1.6 mg/dL — ABNORMAL LOW (ref 1.7–2.4)

## 2020-11-09 MED ORDER — GADOBUTROL 1 MMOL/ML IV SOLN
4.1000 mL | Freq: Once | INTRAVENOUS | Status: AC | PRN
  Administered 2020-11-09: 4.1 mL via INTRAVENOUS

## 2020-11-09 NOTE — Progress Notes (Addendum)
PROGRESS NOTE    Deanna Baldwin  VOZ:[id number] DOB: 1975-04-22 DOA: 11/06/2020 PCP: [name], MD   Chief Complaint  Patient presents with   abnormal labs   Brief Narrative/Hospital Course: 45 old female with history of severe MS with spastic paraplegia contracture, multiple decubitus ulcer over sacral area, protein calorie moderation hypothyroidism recent admission for severe hypercalcemia due to dehydration treated with calcitonin and pamidronate subsequently had hypo calcium and that was aggressively repleted and discharged to skilled nursing facility/nursing home returns to the ED with abnormal labs with low calcium. In the ED patient was asymptomatic, vitals stable labs showed calcium 4.111 3.0 mag 1.1 phosphorus 8.3 ionized calcium 0.38 given 1 g calcium calcium gluconate magnesium and admitted Patient further received IV magnesium and calcium and discussed w/ nephrologist Nursing home staff/sister reported episode of seizure on 10/3 at [facility], so neurology was consulted Subjective: Overnight no fever intermittent tachycardic and hypertensive in 90s (not new) Back from mri and is drowsy  Assessment & Plan:  Hypocalcemia:Multifactorial suspect due to recent use of pamidronate for hypercalcemia. So far IV calcium gluconate 3 g.  Corrected calcium this morning 9.1.  Continue calcitriol twice daily and Tums 3 tablets 4 times daily.  Continue current oral regimen and  ensure calcium remains stable next 24 hours w/o need for iv replacement.  I had discussed with nephrology [name] previously.  Hypomagnesemia: Resolved.  Needing IV mag 10/8.  Continue oral supplementation  Mild metabolic acidosis resolved.    Secondary progressive multiple sclerosis with spastic paraplegia Chronic pain: Continue supportive care muscle relaxant baclofen/Zanaflex  Episode of seizure at NH on 11/03/20-informed by facility nursing staff/sister  on 10/7-possible provoked seizure at risk of developing  epilepsy. Neurology has been consulted EEG unremarkable.  MRI brain with and without contrast this shows chronic severe demyelinating disease, 2 superimposed small areas of diffusion restriction suspicious for active demyelination.Continue Keppra by neurology.  Monitor ELECTROLYTES and replete  Decubitus ulcer of sacral region, unstageable POA continue routine wound care offloading.  Severe protein calorie malnutrition: Patient reports she has "high metabolism" can she can eat orally and not gain weight.  Continue to augment diet as per dietitian.  Chronic constipation supportive care laxatives as needed.  Refused MiraLAX yesterday.  Hypotension on midodrine.  Systolic blood pressure at times ranges from 80s to 90s, also gets tachycardic which is not new for her.  Hypothyroidism continue Synthroid   Diet Order             Diet regular Room service appropriate? Yes; Fluid consistency: Thin  Diet effective now                   DVT prophylaxis: heparin injection 5,000 Units Start: 11/07/20 0045 Foot Pump / plexipulse Start: 11/07/20 0040 Code Status:   Code Status: Full Code  Family Communication: plan of care discussed with patient at bedside. Status is: Inpatient  Patient remains hospitalized will need at least 2 midnight care for management of severe hypocalcemia needing IV replacement Dispo: The patient is from:  [place] home              Anticipated d/c is to:  [place] home likely next 24 to 48 hours              Patient currently is not medically stable to d/c.   Difficult to place patient No  Objective: Vitals: Today's Vitals   11/08/20 1100 11/08/20 2015 11/08/20 2343 11/09/20 0500  BP: 105/70 97/71 98/78  92/62  Pulse: (!) 118 (!) 104 (!) 117 96  Resp: 18 (!) 23 (!) 23 18  Temp: 98.7 F (37.1 C) 99.1 F (37.3 C)  98.6 F (37 C)  TempSrc: Oral Oral  Oral  SpO2:  98%  98%  Weight:      PainSc:  0-No pain     Physical  Examination: General exam: drowsy, thin frail HEENT:Oral mucosa moist, Ear/Nose WNL grossly, dentition normal. Respiratory system: bilaterally clear, no use of accessory muscle Cardiovascular system: S1 & S2 +, No JVD,. Gastrointestinal system: Abdomen soft,urostomy+ NT,ND, BS+ Nervous System: drowsy Extremities: no edema, contractures in lower extremities Skin: No rashes,no icterus. MSK: Normal muscle bulk,tone, power   Medications reviewed:  Scheduled Meds:  acidophilus  1 capsule Oral Daily   ascorbic acid  500 mg Oral q AM   baclofen  10 mg Oral TID   bisacodyl  5 mg Oral Daily   calcitRIOL  0.25 mcg Oral BID   calcium carbonate  3 tablet Oral TID WC & HS   heparin  5,000 Units Subcutaneous Q8H   levothyroxine  100 mcg Oral QAC breakfast   magnesium oxide  400 mg Oral BID   midodrine  2.5 mg Oral TID WC   pantoprazole  40 mg Oral Daily   polyethylene glycol  17 g Oral Daily   tiZANidine  2 mg Oral TID   Continuous Infusions:  sodium chloride Stopped (11/08/20 2349)   levETIRAcetam Stopped (11/08/20 2319)    Intake/Output  Intake/Output Summary (Last 24 hours) at 11/09/2020 1151 Last data filed at 11/08/2020 2253 Gross per 24 hour  Intake 860 ml  Output 600 ml  Net 260 ml   Intake/Output from previous day: 10/08 0701 - 10/09 0700 In: 49 [P.O.:860] Out: 600 [Urine:600] Net IO Since Admission: 660 mL [11/09/20 1151]   Weight change:   Wt Readings from Last 3 Encounters:  11/08/20 41.7 kg  10/24/20 42.7 kg  09/28/18 43.1 kg     Consultants:see note  Procedures:see note Antimicrobials: Anti-infectives (From admission, onward)    None      Culture/Microbiology    Component Value Date/Time   SDES BLOOD RIGHT ANTECUBITAL 10/18/2020 0254   SDES BLOOD RIGHT HAND 10/18/2020 0254   SPECREQUEST  10/18/2020 0254    BOTTLES DRAWN AEROBIC AND ANAEROBIC Blood Culture adequate volume   SPECREQUEST  10/18/2020 0254    BOTTLES DRAWN AEROBIC AND ANAEROBIC Blood  Culture adequate volume   CULT  10/18/2020 0254    NO GROWTH 5 DAYS Performed at [facility], [place] [address] [place], [place] [address]    CULT  10/18/2020 0254    NO GROWTH 5 DAYS Performed at [facility], [place] [address], [place], [place] [address]    REPTSTATUS 10/23/2020 FINAL 10/18/2020 0254   REPTSTATUS 10/23/2020 FINAL 10/18/2020 0254    Other culture-see note  Unresulted Labs (From admission, onward)     Start     Ordered   11/08/20 8250  Basic metabolic panel  5A & 5P,   R     Question:  Specimen collection method  Answer:  Lab=Lab collect   11/07/20 2148   11/08/20 0500  Hepatic function panel  Daily,   R     Question:  Specimen collection method  Answer:  Lab=Lab collect   11/07/20 2148   11/07/20 2149  calcium, ionized  (calcium gluconate and labs)  5A & 5P,   R  Question:  Specimen collection method  Answer:  Lab=Lab collect   11/07/20 2148   11/07/20 2149  Magnesium  5A & 5P,   R     Question:  Specimen collection method  Answer:  Lab=Lab collect   11/07/20 2148   11/07/20 2149  Phosphorus  5A & 5P,   R     Question:  Specimen collection method  Answer:  Lab=Lab collect   11/07/20 2148            Data Reviewed: I have personally reviewed following labs and imaging studies CBC: Recent Labs  Lab 11/06/20 1700 11/06/20 1707  WBC  --  9.5  NEUTROABS  --  7.6  HGB 12.9 10.3*  HCT 38.0 33.6*  MCV  --  94.1  PLT  --  591*   Basic Metabolic Panel: Recent Labs  Lab 11/06/20 1707 11/07/20 0500 11/07/20 2207 11/08/20 0226 11/08/20 1629 11/09/20 0458  NA 144 145 142 140 143 144  K 3.6 3.7 3.7 4.1 4.5 5.0  CL 106 109 106 105 108 105  CO2 22 17* 23 23 24 27   GLUCOSE 99 87 148* 144* 119* 98  BUN 19 17 15 15 15 17   CREATININE 0.60 0.69 0.69 0.69 0.57 0.57  CALCIUM 4.1* 4.3* 6.9* 6.9* 6.5* 7.9*  MG 1.1* 1.5* 2.4 1.9 1.5* 2.0  PHOS 8.3*  --  5.9* 5.5* 5.5* 6.0*   GFR: Estimated Creatinine Clearance: 59.1 mL/min (by C-G formula based  on SCr of 0.57 mg/dL). Liver Function Tests: Recent Labs  Lab 11/06/20 1707 11/07/20 0500 11/08/20 0226 11/09/20 0458  AST 19 17 13* 16  ALT 10 11 10 9   ALKPHOS 142* 135* 116 106  BILITOT 0.2* 0.5 0.2* 0.2*  PROT 7.8 7.2 6.5 6.7  ALBUMIN 3.0* 2.8* 2.3* 2.5*   No results for input(s): LIPASE, AMYLASE in the last 168 hours. No results for input(s): AMMONIA in the last 168 hours. Coagulation Profile: No results for input(s): INR, PROTIME in the last 168 hours. Cardiac Enzymes: No results for input(s): CKTOTAL, CKMB, CKMBINDEX, TROPONINI in the last 168 hours. BNP (last 3 results) No results for input(s): PROBNP in the last 8760 hours. HbA1C: No results for input(s): HGBA1C in the last 72 hours. CBG: No results for input(s): GLUCAP in the last 168 hours. Lipid Profile: No results for input(s): CHOL, HDL, LDLCALC, TRIG, CHOLHDL, LDLDIRECT in the last 72 hours. Thyroid Function Tests: Recent Labs    11/06/20 1707  TSH 3.839  FREET4 1.22*   Anemia Panel: No results for input(s): VITAMINB12, FOLATE, FERRITIN, TIBC, IRON, RETICCTPCT in the last 72 hours. Sepsis Labs: No results for input(s): PROCALCITON, LATICACIDVEN in the last 168 hours.  Recent Results (from the past 240 hour(s))  Resp Panel by RT-PCR (Flu A&B, Covid) Nasopharyngeal Swab     Status: None   Collection Time: 11/07/20  1:27 AM   Specimen: Nasopharyngeal Swab; Nasopharyngeal(NP) swabs in vial transport medium  Result Value Ref Range Status   SARS Coronavirus 2 by RT PCR NEGATIVE NEGATIVE Final    Comment: (NOTE) SARS-CoV-2 target nucleic acids are NOT DETECTED.  The SARS-CoV-2 RNA is generally detectable in upper respiratory specimens during the acute phase of infection. The lowest concentration of SARS-CoV-2 viral copies this assay can detect is 138 copies/mL. A negative result does not preclude SARS-Cov-2 infection and should not be used as the sole basis for treatment or other patient management  decisions. A negative result may occur with  improper specimen  collection/handling, submission of specimen other than nasopharyngeal swab, presence of viral mutation(s) within the areas targeted by this assay, and inadequate number of viral copies(<138 copies/mL). A negative result must be combined with clinical observations, patient history, and epidemiological information. The expected result is Negative.  Fact Sheet for Patients:  EntrepreneurPulse.com.au  Fact Sheet for Healthcare Providers:  IncredibleEmployment.be  This test is no t yet approved or cleared by the [place] FDA and  has been authorized for detection and/or diagnosis of SARS-CoV-2 by FDA under an Emergency Use Authorization (EUA). This EUA will remain  in effect (meaning this test can be used) for the duration of the COVID-19 declaration under Section 564(b)(1) of the Act, 21 U.S.C.section 360bbb-3(b)(1), unless the authorization is terminated  or revoked sooner.       Influenza A by PCR NEGATIVE NEGATIVE Final   Influenza B by PCR NEGATIVE NEGATIVE Final    Comment: (NOTE) The Xpert Xpress SARS-CoV-2/FLU/RSV plus assay is intended as an aid in the diagnosis of influenza from Nasopharyngeal swab specimens and should not be used as a sole basis for treatment. Nasal washings and aspirates are unacceptable for Xpert Xpress SARS-CoV-2/FLU/RSV testing.  Fact Sheet for Patients: EntrepreneurPulse.com.au  Fact Sheet for Healthcare Providers: IncredibleEmployment.be  This test is not yet approved or cleared by the [place] FDA and has been authorized for detection and/or diagnosis of SARS-CoV-2 by FDA under an Emergency Use Authorization (EUA). This EUA will remain in effect (meaning this test can be used) for the duration of the COVID-19 declaration under Section 564(b)(1) of the Act, 21 U.S.C. section 360bbb-3(b)(1), unless the  authorization is terminated or revoked.  Performed at [facility], [place] [address] [place], [place] [address]      Radiology Studies: MR BRAIN W WO CONTRAST  Result Date: 11/09/2020 CLINICAL DATA:  45 year old female with chronic multiple sclerosis. New symptoms. EXAM: MRI HEAD WITHOUT AND WITH CONTRAST TECHNIQUE: Multiplanar, multiecho pulse sequences of the brain and surrounding structures were obtained without and with intravenous contrast. CONTRAST:  4.63mL GADAVIST GADOBUTROL 1 MMOL/ML IV SOLN COMPARISON:  Report of brain MRI 09/16/2002 (no images available). FINDINGS: Brain: Advanced brain volume loss for age. Extensive bilateral abnormal cerebral white matter signal and volume loss. Associated severe atrophy of the corpus callosum. Scattered subcortical white matter involvement, most pronounced in the right parietal lobe. And associated cortical involvement also most pronounced in the posterior right superior frontal and parietal lobes. Comparatively mild associated heterogeneity in the deep gray matter nuclei. But there is Wallerian degeneration in the right brainstem, and moderate heterogeneity in the pons and cerebellar peduncles. Cerebellum otherwise relatively spared. Mildly heterogeneous cervicomedullary junction. On DWI there is a punctate focus of restricted diffusion in the left superior frontal gyrus subcortical white matter (series 2, image 42). And there also appears to be a small area of cortical restricted diffusion along the right parieto-occipital lobe (series 2, image 35). But no other definite diffusion restriction. Postcontrast images are mildly motion degraded. No abnormal enhancement identified. No superimposed restricted diffusion suggestive of acute infarction. No midline shift, mass effect, evidence of mass lesion, ventriculomegaly, extra-axial collection or acute intracranial hemorrhage. Pituitary within normal limits. No chronic cerebral blood products identified.  Vascular: Major intracranial vascular flow voids are preserved. Skull and upper cervical spine: Grossly negative visible cervical spine. Visualized bone marrow signal is within normal limits. Sinuses/Orbits: Symmetric orbits.  Paranasal sinuses are clear. Other: Trace left mastoid air cell fluid. Negative visible nasopharynx, right mastoids and  other visible internal auditory structures. IMPRESSION: 1. Chronic severe demyelinating disease. Two superimposed small areas of diffusion restriction suspicious for active demyelination in the inferior right parietal lobe and left superior frontal gyrus (punctate), although no definite enhancement. 2. No other acute intracranial abnormality. Electronically Signed   By: Genevie Ann M.D.   On: 11/09/2020 11:20   EEG adult  Result Date: 11/07/2020 Lora Havens, MD     11/07/2020  7:44 PM Patient Name: Analeise Mccleery MRN: [id number] Epilepsy Attending: Lora Havens Referring Physician/Provider: Anibal Henderson, NP Date: 11/07/2020 Duration: 22.48 mins Patient history: 45 y.o. female who presented to the ED 10/6 due to lab abnormalities identified at patient's facility with reports of seizure-like activity 10/3. Unable to obtain further collateral information other than 1 mg IM lorazepam administered for seizure-like activity lasting < 5 minutes. EEG to evaluate for seizure Level of alertness: Awake AEDs during EEG study: LEV Technical aspects: This EEG study was done with scalp electrodes positioned according to the 10-20 International system of electrode placement. Electrical activity was acquired at a sampling rate of 500Hz  and reviewed with a high frequency filter of 70Hz  and a low frequency filter of 1Hz . EEG data were recorded continuously and digitally stored. Description: The posterior dominant rhythm consists of 8Hz  activity of moderate voltage (25-35 uV) seen predominantly in posterior head regions, symmetric and reactive to eye opening and eye closing.  Hyperventilation and photic stimulation were not performed.   IMPRESSION: This study is within normal limits. No seizures or epileptiform discharges were seen throughout the recording. Lora Havens     LOS: 2 days   Antonieta Pert, MD [facility]  11/09/2020, 11:51 AM

## 2020-11-09 NOTE — Progress Notes (Addendum)
Patient sedate after MRI with ativan. Sleeping with frequent assessment. Unable to awaken patient enough to give medication or eat. 1600 patient awake and alert, given medication and eating late lunch. Resting comfortable with no complaints. Patient more vocal, oriented alert today in comparison to yesterday.  Unable to complete admission assessment today because of the amount of time patient sedated. Reported off to [employer]

## 2020-11-09 NOTE — Progress Notes (Signed)
Neurology Progress Note  Subjective: No acute overnight events Patient reportedly refusing MRI multiple times yesterday to RN and MRI staff, patient has been agreeable to MRI with discussion with NP. Agreeable for MRI again this morning- contacted bedside RN and MRI staff for coordination.   Exam: Vitals:   11/08/20 2343 11/09/20 0500  BP: 98/78 92/62  Pulse: (!) 117 96  Resp: (!) 23 18  Temp:  98.6 F (37 C)  SpO2:  98%   Gen: Awake, laying in bed, in no acute distress Resp: non-labored breathing, no respiratory distress Abd: soft, non-tender, non-distended, urostomy RLQ  Neuro: Mental Status: Awake, alert, oriented to person, place, age, and month. She incorrectly states that the year is 2023. States that she does not recall seeing examiner for the past 2 days and remains confused as to why she is in the hospital.  She follows commands without difficulty Her speech is intact without dysarthria She is able to name her thumb and little finger to touch but is unable to name objects that examiner is holding due to impaired vision (chronic).  There are no signs of aphasia or neglect.  Cranial Nerves: PERRL, EOMI and she orients to voice, facial sensation is intact and symmetric to light touch, face is symmetric resting and smiling, hearing is intact to voice, phonation is normal, palate elevates symmetrically, tongue protrudes midline.  Motor: Spastic paraparesis present with contractures of BLE.  RUE strength and movement > LUE. RUE is able to withstand antigravity movement without vertical drift.  LUE is with minimal antigravity movement.  Increased tone present in BLE and LUE.  Bulk is significantly decreased throughout.  Sensory: Intact and symmetric to light touch throughout Gait: Deferred, patient is wheelchair bound and nonambulatory at baseline  Pertinent Labs:  Basic Metabolic Panel: Recent Labs  Lab 11/06/20 1707 11/07/20 0500 11/07/20 2207 11/08/20 0226  11/08/20 1629 11/09/20 0458  NA 144 145 142 140 143 144  K 3.6 3.7 3.7 4.1 4.5 5.0  CL 106 109 106 105 108 105  CO2 22 17* 23 23 24 27   GLUCOSE 99 87 148* 144* 119* 98  BUN 19 17 15 15 15 17   CREATININE 0.60 0.69 0.69 0.69 0.57 0.57  CALCIUM 4.1* 4.3* 6.9* 6.9* 6.5* 7.9*  MG 1.1* 1.5* 2.4 1.9 1.5* 2.0  PHOS 8.3*  --  5.9* 5.5* 5.5* 6.0*   CBC: Recent Labs  Lab 11/06/20 1700 11/06/20 1707  WBC  --  9.5  NEUTROABS  --  7.6  HGB 12.9 10.3*  HCT 38.0 33.6*  MCV  --  94.1  PLT  --  653*   Coagulation Studies: No results for input(s): LABPROT, INR in the last 72 hours.   Imaging Reviewed:  MRI brain pending  MRI brain 07/15/2016 personally reviewed by attending MD, agree with radiology: Mutiple infratentorial and hemispheric T2/FLAIR hyperintense foci consistent with chronic demyelinating plaque associated with multiple sclerosis.  None of the foci appeared to be acute. Moderate cortical atrophy and corpus callosum atrophy with mild brainstem atrophy. There are no acute findings.  EEG 11/07/2020: This study is within normal limits. No seizures or epileptiform discharges were seen throughout the recording.  Assessment: 45 y.o. female who presented to the ED 10/6 due to lab abnormalities identified at patient's facility with reports of seizure-like activity 10/3 described as 4 minutes of flailing and jerking of the arms with foaming at the mouth followed by lethargy s/p 1 mg IM Ativan and initiation of Keppra 500 mg  BID.  Initial inpatient work-up revealed patient with hypocalcemia at 4.3, ionized calcium of 0.38, corrected calcium of 5.3 hypomagnesemia at 1.5 which are improving. -Examination is improved today from admission. Patient continues to have spastic paraparesis with contracted lower extremities as baseline and right greater than left upper extremity strength and mobility. - MRI brain imaging has not been completed since 2018, pending repeat imaging.  Was reported earlier  today that patient was refusing MRI imaging but she is agreeable NP evaluation. - Patient's acute metabolic encephalopathy may be secondary to metabolic derangement present on hospital arrival.  Reported seizure-like activity involved patient with jerking and pulling of the arms with foaming at the mouth for approximately 4 minutes with subsequent lethargy. Description is concerning for provoked seizure in the setting of severe metabolic derangements on previous and current hospital presentation.  Initially, plan to hold Keppra and continue observation but given the additional history patient is considered high risk for seizures given her MS as well as ongoing difficulty with electrolyte derangements. We will continue Keppra at this time and would like to continue it on an outpatient basis temporarily until her metabolic abnormalities normalize.   Impression Secondary progressive multiple sclerosis Severe electrolyte derangements complicated by delirium Possible seizures in a patient at high risk for developing epilepsy  Recommendations: - Continue Keppra 500 mg twice daily - Continue seizure precautions - Midazolam 2 mg IV PRN any seizure lasting > 5 minutes and notify neurology (critical shortage of Ativan) - Appreciate ongoing management of metabolic derangements per primary team; attending did increase monitoring to q12h on 10/8 to allow for aggressive repletion - Will need to follow closely with outpatient neurology for further AED management, continue Keppra 500 mg twice daily (can be transitioned one-to-one to oral medication) on discharge - Neurology will follow up on MRI brain imaging, please see attending attestation below  Anibal Henderson, AGACNP-BC [facility] [phone]  Attending Neurologist's note:  Agree with NP note as detailed above.  On my later examination patient's examination was quite similar, and notably much improved from admission though she does remain  confused.  MRI brain personally reviewed, radiology read: 1. Chronic severe demyelinating disease. Two superimposed small areas of diffusion restriction suspicious for active demyelination in the inferior right parietal lobe and left superior frontal gyrus (punctate), although no definite enhancement.   2. No other acute intracranial abnormality.  Regarding the restricted diffusion lesions, I do not feel the risks of steroids treatment are outweighed by the benefits given her baseline nonambulatory status as well as unstageable sacral decubitus ulcer with prior osteomyelitis and potential for worsening infection. However also in the differential for these lesions is stroke.  Therefore will obtain blood cultures and TTE to evaluate for bacteremia/endocarditis; hopeful to obtain adequate imaging without TEE given her BMI.  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, I do feel it is important to obtain the studies despite the fact that she has been afebrile with a normal white count  Additional Recommendations: -TTE -Blood cultures to rule out bacteremia -Continue Keppra 500 mg twice daily, potential to wean on an outpatient basis once there is confirmation that her electrolyte derangements have stabilized  I personally saw this patient, gathering history, performing a full neurologic examination, reviewing relevant labs, personally reviewing relevant imaging including MRI brain, and formulated the assessment and plan, adding the note above for completeness and clarity to accurately reflect my thoughts  Greater than 35 minutes were spent in direct care of this patient,  greater than 50% of which was at bedside / direct coordination of care.

## 2020-11-10 ENCOUNTER — Inpatient Hospital Stay (HOSPITAL_COMMUNITY): Payer: Medicare (Managed Care)

## 2020-11-10 DIAGNOSIS — Z7189 Other specified counseling: Secondary | ICD-10-CM

## 2020-11-10 DIAGNOSIS — R627 Adult failure to thrive: Secondary | ICD-10-CM | POA: Diagnosis not present

## 2020-11-10 DIAGNOSIS — G8220 Paraplegia, unspecified: Secondary | ICD-10-CM | POA: Diagnosis not present

## 2020-11-10 DIAGNOSIS — Z515 Encounter for palliative care: Secondary | ICD-10-CM | POA: Diagnosis not present

## 2020-11-10 DIAGNOSIS — I9589 Other hypotension: Secondary | ICD-10-CM | POA: Diagnosis not present

## 2020-11-10 DIAGNOSIS — L89150 Pressure ulcer of sacral region, unstageable: Secondary | ICD-10-CM | POA: Diagnosis not present

## 2020-11-10 LAB — CALCIUM, IONIZED: Calcium, Ionized, Serum: 4.5 mg/dL (ref 4.5–5.6)

## 2020-11-10 LAB — HEPATIC FUNCTION PANEL
ALT: 12 U/L (ref 0–44)
AST: 17 U/L (ref 15–41)
Albumin: 2.3 g/dL — ABNORMAL LOW (ref 3.5–5.0)
Alkaline Phosphatase: 94 U/L (ref 38–126)
Bilirubin, Direct: <0.1 mg/dL (ref 0.0–0.2)
Total Bilirubin: 0.5 mg/dL (ref 0.3–1.2)
Total Protein: 6.6 g/dL (ref 6.5–8.1)

## 2020-11-10 LAB — BASIC METABOLIC PANEL
Anion gap: 9 (ref 5–15)
BUN: 23 mg/dL — ABNORMAL HIGH (ref 6–20)
CO2: 29 mmol/L (ref 22–32)
Calcium: 6.8 mg/dL — ABNORMAL LOW (ref 8.9–10.3)
Chloride: 102 mmol/L (ref 98–111)
Creatinine, Ser: 0.7 mg/dL (ref 0.44–1.00)
GFR, Estimated: >60 mL/min (ref >60)
Glucose, Bld: 99 mg/dL (ref 70–99)
Potassium: 4.8 mmol/L (ref 3.5–5.1)
Sodium: 140 mmol/L (ref 135–145)

## 2020-11-10 LAB — ECHOCARDIOGRAM COMPLETE
Area-P 1/2: 7.09 cm2
S' Lateral: 2.7 cm

## 2020-11-10 LAB — PHOSPHORUS: Phosphorus: 4.7 mg/dL — ABNORMAL HIGH (ref 2.5–4.6)

## 2020-11-10 LAB — MAGNESIUM: Magnesium: 1.4 mg/dL — ABNORMAL LOW (ref 1.7–2.4)

## 2020-11-10 MED ORDER — LACTATED RINGERS IV BOLUS
1000.0000 mL | Freq: Once | INTRAVENOUS | Status: AC
  Administered 2020-11-10: 1000 mL via INTRAVENOUS

## 2020-11-10 MED ORDER — INFLUENZA VAC SPLIT QUAD 0.5 ML IM SUSY
0.5000 mL | PREFILLED_SYRINGE | INTRAMUSCULAR | Status: AC
  Administered 2020-11-11: 0.5 mL via INTRAMUSCULAR
  Filled 2020-11-10: qty 0.5

## 2020-11-10 MED ORDER — MAGNESIUM SULFATE 4 GM/100ML IV SOLN
4.0000 g | Freq: Once | INTRAVENOUS | Status: AC
  Administered 2020-11-10: 4 g via INTRAVENOUS
  Filled 2020-11-10: qty 100

## 2020-11-10 MED ORDER — MAGNESIUM OXIDE -MG SUPPLEMENT 400 (240 MG) MG PO TABS
800.0000 mg | ORAL_TABLET | Freq: Two times a day (BID) | ORAL | Status: DC
  Administered 2020-11-10 – 2020-11-12 (×5): 800 mg via ORAL
  Filled 2020-11-10 (×5): qty 2

## 2020-11-10 NOTE — Consult Note (Signed)
Consultation Note Date: 11/10/2020   Patient Name: Deanna Baldwin  DOB: 1976/01/15  MRN: [id number]  Age / Sex: 45 y.o., female  PCP: [name], MD Referring Physician: Antonieta Pert, MD  Reason for Consultation: Establishing goals of care and Psychosocial/spiritual support  HPI/Patient Profile: 45 y.o. female   admitted on 11/06/2020 with past medical  history of severe MS, spastic paraplegia secondary MS, multiple decubitus ulcers over sacral area, protein calorie malnutrition, hypothyroidism was sent to the ER today by the nursing home due to persistently low calcium levels on outpatient labs.    Patient been admitted earlier in September 2022 due to hypercalcemia with serum calcium score than 15.  She received a dose of 60 mg of pamidronate on 10-16-2020.  Subsequently she had several episodes of hypocalcemia while still in the hospital.  She had to be treated with multiple doses of IV calcium.  Patient has had continued physical, functional decline over the past many years secondary to  disease progression of multiple sclerosis  Patient is a long term resident of [facility].      Clinical Assessment and Goals of Care:  This NP Wadie Lessen reviewed medical records, received report from team, assessed the patient and then met patient at her bedside to discuss diagnosis, prognosis, GOC, EOL wishes disposition and options, and spoke to her sister by phone.    Concept of Palliative Care was introduced as specialized medical care for people and their families living with serious illness.  If focuses on providing relief from the symptoms and stress of a serious illness.  The goal is to improve quality of life for both the patient and the family.  Values and goals of care important to patient and family were attempted to be elicited.     A  discussion was had today regarding advanced directives.   Concepts specific to code status, artifical feeding and hydration, continued IV antibiotics and rehospitalization was had.  The difference between a aggressive medical intervention path  and a palliative comfort care path for this patient at this time was had.     MOST form introduced   Discussed with patient the importance of continued conversation with family and their  medical providers regarding overall plan of care and treatment options,  ensuring decisions are within the context of the patients values and GOCs.  Meeting is scheduled for tomorrow at at 2 pm for ongoing conversation regarding GOCs.      Questions and concerns addressed.  Patient  encouraged to call with questions or concerns.     PMT will continue to support holistically.    NEXT OF KIN/ sister is listed as main contact    SUMMARY OF RECOMMENDATIONS    Code Status/Advance Care Planning: Limited code Educated patient/family to consider DNR/DNI status understanding evidenced based poor outcomes in similar hospitalized patient, as the cause of arrest is likely associated with advanced chronic illness rather than an easily reversible acute cardio-pulmonary event.     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 The following aspects are  pertinent.  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)    UTI (urinary tract infection) 02/2016   Social History   Socioeconomic History   Marital status: Single    Spouse name: Not on file   Number of children: 1   Years of education: Not on file   Highest education level: Not on file  Occupational History   Occupation: Disabled  Tobacco Use   Smoking status: Former    Packs/day: 1.00    Years: 10.00    Pack years: 10.00    Types: Cigarettes    Quit date: 02/01/2010    Years since quitting: 10.7   Smokeless tobacco: Never  Vaping Use   Vaping Use: Never used  Substance and Sexual Activity   Alcohol use: No   Drug use: No   Sexual activity: Not on file  Other Topics Concern   Not on file  Social History Narrative   She is a resident at [facility].   Right-handed.   Social Determinants of Health   Financial Resource Strain: Not on file  Food Insecurity: Not on file  Transportation Needs: Not on file  Physical Activity: Not on file  Stress: Not on file  Social Connections: Not on file   Family History  Problem Relation Age of Onset   Diabetes Mellitus I Mother    Mental illness Sister    Scheduled Meds:  acidophilus  1 capsule Oral Daily   ascorbic acid  500 mg Oral q AM   baclofen  10 mg Oral TID   bisacodyl  5 mg Oral Daily   calcitRIOL  0.25 mcg Oral BID   calcium carbonate  3 tablet Oral TID WC & HS   heparin  5,000 Units Subcutaneous Q8H   [START ON 11/11/2020] influenza vac split quadrivalent PF  0.5 mL Intramuscular Tomorrow-1000   levothyroxine  100 mcg Oral QAC breakfast   magnesium oxide  800 mg Oral BID    midodrine  2.5 mg Oral TID WC   pantoprazole  40 mg Oral Daily   polyethylene glycol  17 g Oral Daily   tiZANidine  2 mg Oral TID   Continuous Infusions:  sodium chloride Stopped (11/09/20 2256)   levETIRAcetam 500 mg (11/10/20 1101)   PRN Meds:.sodium chloride, acetaminophen **OR** acetaminophen, LORazepam Medications Prior to Admission:  Prior to Admission medications   Medication Sig Start Date End Date Taking? Authorizing Provider  acetaminophen (TYLENOL) 325 MG tablet Take 2 tablets (650 mg total) by mouth every 6 (six) hours as needed for mild pain (or  Fever >/= 101). 04/11/17  Yes Eugenie Filler, MD  ascorbic acid (VITAMIN C) 500 MG tablet Take 500 mg by mouth in the morning.   Yes [provider]  baclofen (LIORESAL) 10 MG tablet Take 10 mg by mouth 3 (three) times daily.   Yes [provider]  bisacodyl (DULCOLAX) 5 MG EC tablet Take 5 mg by mouth daily.   Yes [provider]  CALCIUM 600-D 600-400 MG-UNIT TABS Take 2 tablets by mouth 3 (three) times daily.   Yes [provider]  Cyanocobalamin (VITAMIN B-12 PO) Take 500 mcg by mouth daily.   Yes [provider]  ENSURE (ENSURE) Take 120 mLs by mouth 3 (three) times daily between meals. With meals. Vanilla   Yes [provider]  ferrous sulfate 325 (65 FE) MG tablet Take 325 mg by mouth daily with breakfast.   Yes [provider]  ibuprofen (ADVIL,MOTRIN) 200 MG tablet Take 200 mg by mouth every 12 (twelve) hours as needed (for pain). 04/25/17  Yes [provider]  KEPPRA 500 MG tablet Take 500 mg by mouth in the morning and at bedtime.   Yes [provider]  levothyroxine (SYNTHROID) 100 MCG tablet Take 100 mcg by mouth daily before breakfast.   Yes [provider]  magnesium hydroxide (MILK OF MAGNESIA) 400 MG/5ML suspension Take 30 mLs by mouth every 12 (twelve) hours as needed for mild constipation.   Yes [provider]   midodrine (PROAMATINE) 2.5 MG tablet Take 1 tablet (2.5 mg total) by mouth 3 (three) times daily with meals. 09/10/16  Yes Elgergawy, Silver Huguenin, MD  Multiple Vitamin (MULTIVITAMIN) tablet Take 1 tablet by mouth daily. 06/13/17  Yes [provider]  NON FORMULARY Take 120 mLs by mouth See admin instructions. MedPass shake- Drink 120 ml's by mouth three times a day   Yes [provider]  omeprazole (PRILOSEC) 20 MG capsule Take 20 mg by mouth in the morning.   Yes [provider]  ondansetron (ZOFRAN) 4 MG tablet Take 4 mg by mouth every 6 (six) hours as needed for nausea or vomiting.   Yes [provider]  polyethylene glycol (MIRALAX / GLYCOLAX) packet Take 17 g by mouth See admin instructions. Mix 17 grams of powder into 4-8 ounces of water and drink every morning   Yes [provider]  Probiotic Product (PROBIOTIC PO) Take 1 tablet by mouth daily.   Yes [provider]  PROTEIN PO Take 30 mLs by mouth 2 (two) times daily.   Yes [provider]  SANTYL ointment Apply 1 application topically See admin instructions. Apply to right heel topically every day shift- clean with Dakin's, apply Santyl, and cover with normal saline gauze with foam dressing   Yes [provider]  tiZANidine (ZANAFLEX) 2 MG tablet Take 2 mg by mouth 3 (three) times daily.   Yes [provider]  Nutritional Supplements (NUTRITIONAL SUPPLEMENT PO) Regular Diet - Regular Texture Patient not taking: Reported on 11/06/2020    [provider]  Nutritional Supplements (PROMOD) LIQD Protein Liquid - give 30 ml by mouth two times daily Patient not taking: Reported on 11/06/2020    [provider]   Allergies  Allergen Reactions   Natalizumab Other (See Comments)    Allergy not noted on MAR   Review of Systems  Physical Exam  Vital Signs: BP 93/62 (BP Location: Right Arm)   Pulse (!) 115   Temp 99 F (37.2 C) (  Oral)   Resp (!) 23    Wt 41.7 kg   SpO2 97%   BMI 18.58 kg/m  Pain Scale: 0-10   Pain Score: 0-No pain   SpO2: SpO2: 97 % O2 Device:SpO2: 97 % O2 Flow Rate: .   IO: Intake/output summary:  Intake/Output Summary (Last 24 hours) at 11/10/2020 1522 Last data filed at 11/10/2020 1400 Gross per 24 hour  Intake 2336.42 ml  Output 1450 ml  Net 886.42 ml    LBM: Last BM Date: 11/09/20 Baseline Weight: Weight: 41.7 kg Most recent weight: Weight: 41.7 kg     Palliative Assessment/Data: 30 %   Discussed with Dr Lupita Leash  Time In: 1545 Time Out: 1655 Time Total: 70 minutes Greater than 50%  of this time was spent counseling and coordinating care related to the above assessment and plan.  Signed by: Wadie Lessen, NP   Please contact Palliative Medicine Team phone at [phone] for questions and concerns.  For individual provider: See Shea Evans

## 2020-11-10 NOTE — Progress Notes (Signed)
  Echocardiogram 2D Echocardiogram has been performed.  Deanna Baldwin 11/10/2020, 2:52 PM

## 2020-11-10 NOTE — Progress Notes (Signed)
PROGRESS NOTE    Deanna Baldwin  SWH:[id number] DOB: December 10, 1975 DOA: 11/06/2020 PCP: [name], MD   Chief Complaint  Patient presents with   abnormal labs   Brief Narrative/Hospital Course: 45 old female with history of severe MS with spastic paraplegia contracture, multiple decubitus ulcer over sacral area, protein calorie moderation hypothyroidism recent admission for severe hypercalcemia due to dehydration treated with calcitonin and pamidronate subsequently had hypo calcium and that was aggressively repleted and discharged to skilled nursing facility/nursing home returns to the ED with abnormal labs with low calcium. In the ED patient was asymptomatic, vitals stable labs showed calcium 4.111 3.0 mag 1.1 phosphorus 8.3 ionized calcium 0.38 given 1 g calcium calcium gluconate magnesium and admitted Patient further received IV magnesium and calcium and discussed w/ nephrologist Nursing home staff/sister reported episode of seizure on 10/3 at [facility], so neurology was consulted.  Subjective:  Seen this morning she is alert awake oriented at baseline.  Denies any complaints. Mag this am at 1.4 ca 6.8  Assessment & Plan:  Hypocalcemia:Multifactorial suspect due to recent use of pamidronate for hypercalcemia. So far IV calcium gluconate 3 g.  Corrected calcium 10/9- 9.1-this a.m. corrected calcium 8.2. Continue calcitriol twice daily and Tums 3 tablets 4 times daily.  Continue current oral regimen and  ensure calcium remains stable next 24 hours w/o need for iv replacement.  I had discussed with nephrology [name] previously.  Hypomagnesemia: Replete with IV mag 4 gm today- increase oral Mag-Ox  to 800 mg bid  Mild metabolic acidosis resolved.    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 Advised to rule out bacterial infection with TTE and blood culture as diffusion restriction could be due to stroke.Continue supportive care muscle relaxant baclofen/Zanaflex  Episode of seizure at NH on 11/03/20-informed by facility nursing staff/sister  on 10/7-possible provoked seizure at risk of developing epilepsy.  Seen by neurology plan is to continue on Keppra 5 mg twice daily on long-term and outpatient follow-up.  Monitor electrolytes and replete.  Decubitus ulcer of sacral region, unstageable POA continue routine wound care offloading.  Severe protein calorie malnutrition: Patient reports she has "high metabolism" can she can eat orally and not gain weight.  Continue to augment diet as per dietitian.  Chronic constipation supportive care laxatives as needed.  Refused MiraLAX yesterday. Anemia of chronic disease Hypotension on midodrine.  Systolic blood pressure at times ranges from 80s to 90s, also gets tachycardic which is -not new for her.  This drives her new discomfort at times.  Patient is largely asymptomatic.  Overnight bolus  ivf given for blood pressure 66Z systolic.  Hypothyroidism continue Synthroid  Diet Order             Diet regular Room service appropriate? Yes; Fluid consistency: Thin  Diet effective now                   DVT prophylaxis: heparin injection 5,000 Units Start: 11/07/20 0045 Foot Pump / plexipulse Start: 11/07/20 0040 Code Status:   Code Status: Full Code  Family Communication: plan of care discussed with patient at bedside. Status is: Inpatient  Patient remains hospitalized will need at least 2 midnight care for management of severe  hypocalcemia needing IV replacement Dispo: The patient is from:  [facility]              Anticipated d/c is to:   [place] home likely next 24 to 48 hours              Patient currently is not medically stable to d/c.   Difficult to place patient No  Objective: Vitals: Today's Vitals   11/10/20 0449 11/10/20 0500 11/10/20 0624 11/10/20 0750  BP: 96/69  102/75 97/69  Pulse: (!) 101  98 98  Resp: (!) 27  18 (!) 23  Temp:  98.5 F (36.9 C) 98.5 F (36.9 C) 98.7 F (37.1 C)  TempSrc:  Oral Oral Oral  SpO2: 97%   97%  Weight:      PainSc:  0-No pain     Physical Examination: General exam: AAOx 3, at baseline thin cachectic, older than stated age, weak appearing. HEENT:Oral mucosa moist, Ear/Nose WNL grossly, dentition normal. Respiratory system: bilaterally clear breath sounds, no use of accessory muscle Cardiovascular system: S1 & S2 +, No JVD,. Gastrointestinal system: Abdomen soft, urostomy bag in place NT,ND, BS+ Nervous System:Alert, awake, moving extremities and grossly nonfocal Extremities: no edema, distal peripheral pulses palpable.  Skin: No rashes,no icterus. MSK: small muscle bulk,tone, power   Medications reviewed:  Scheduled Meds:  acidophilus  1 capsule Oral Daily   ascorbic acid  500 mg Oral q AM   baclofen  10 mg Oral TID   bisacodyl  5 mg Oral Daily   calcitRIOL  0.25 mcg Oral BID   calcium carbonate  3 tablet Oral TID WC & HS   heparin  5,000 Units Subcutaneous Q8H   levothyroxine  100 mcg Oral QAC breakfast   magnesium oxide  800 mg Oral BID   midodrine  2.5 mg Oral TID WC   pantoprazole  40 mg Oral Daily   polyethylene glycol  17 g Oral Daily   tiZANidine  2 mg Oral TID   Continuous Infusions:  sodium chloride Stopped (11/09/20 2256)   levETIRAcetam 500 mg (11/10/20 1101)   magnesium sulfate bolus IVPB      Intake/Output  Intake/Output Summary (Last 24 hours) at 11/10/2020 1103 Last data filed at 11/10/2020 0624 Gross per 24 hour  Intake 1656.42 ml  Output 1450 ml  Net 206.42 ml   Intake/Output from previous day: 10/09 0701 - 10/10  0700 In: 1656.4 [P.O.:720; I.V.:16.5; IV Piggyback:919.9] Out: 1450 [Urine:1450] Net IO Since Admission: 866.42 mL [11/10/20 1103]   Weight change:   Wt Readings from Last 3 Encounters:  11/08/20 41.7 kg  10/24/20 42.7 kg  09/28/18 43.1 kg     Consultants:see note  Procedures:see note Antimicrobials: Anti-infectives (From admission, onward)    None      Culture/Microbiology    Component Value Date/Time   SDES BLOOD RIGHT ANTECUBITAL 11/09/2020 1901   SPECREQUEST IN PEDIATRIC BOTTLE Blood Culture adequate volume 11/09/2020 1901   CULT  11/09/2020 1901    NO GROWTH < 12 HOURS Performed at [facility], [place] [address], [place], [place] [address]    REPTSTATUS PENDING 11/09/2020 1901    Other culture-see note  Unresulted Labs (From admission, onward)     Start     Ordered   11/11/20 6606  Basic metabolic panel  Daily,   R     Question:  Specimen collection method  Answer:  Lab=Lab collect   11/10/20 3016   11/11/20  0500  Magnesium  Tomorrow morning,   R       Question:  Specimen collection method  Answer:  Lab=Lab collect   11/10/20 0822   11/07/20 2149  calcium, ionized  (calcium gluconate and labs)  5A & 5P,   R (with TIMED occurrences)     Question:  Specimen collection method  Answer:  Lab=Lab collect   11/07/20 2148   11/07/20 2149  Magnesium  5A & 5P,   R (with TIMED occurrences)     Question:  Specimen collection method  Answer:  Lab=Lab collect   11/07/20 2148   11/07/20 2149  Phosphorus  5A & 5P,   R (with TIMED occurrences)     Question:  Specimen collection method  Answer:  Lab=Lab collect   11/07/20 2148            Data Reviewed: I have personally reviewed following labs and imaging studies CBC: Recent Labs  Lab 11/06/20 1700 11/06/20 1707  WBC  --  9.5  NEUTROABS  --  7.6  HGB 12.9 10.3*  HCT 38.0 33.6*  MCV  --  94.1  PLT  --  762*   Basic Metabolic Panel: Recent Labs  Lab 11/08/20 0226 11/08/20 1629 11/09/20 0458  11/09/20 1707 11/10/20 0550  NA 140 143 144 141 140  K 4.1 4.5 5.0 5.0 4.8  CL 105 108 105 102 102  CO2 23 24 27 26 29   GLUCOSE 144* 119* 98 110* 99  BUN 15 15 17 17  23*  CREATININE 0.69 0.57 0.57 0.66 0.70  CALCIUM 6.9* 6.5* 7.9* 7.2* 6.8*  MG 1.9 1.5* 2.0 1.6* 1.4*  PHOS 5.5* 5.5* 6.0* 5.1* 4.7*   GFR: Estimated Creatinine Clearance: 59.1 mL/min (by C-G formula based on SCr of 0.7 mg/dL). Liver Function Tests: Recent Labs  Lab 11/06/20 1707 11/07/20 0500 11/08/20 0226 11/09/20 0458 11/10/20 0550  AST 19 17 13* 16 17  ALT 10 11 10 9 12   ALKPHOS 142* 135* 116 106 94  BILITOT 0.2* 0.5 0.2* 0.2* 0.5  PROT 7.8 7.2 6.5 6.7 6.6  ALBUMIN 3.0* 2.8* 2.3* 2.5* 2.3*   No results for input(s): LIPASE, AMYLASE in the last 168 hours. No results for input(s): AMMONIA in the last 168 hours. Coagulation Profile: No results for input(s): INR, PROTIME in the last 168 hours. Cardiac Enzymes: No results for input(s): CKTOTAL, CKMB, CKMBINDEX, TROPONINI in the last 168 hours. BNP (last 3 results) No results for input(s): PROBNP in the last 8760 hours. HbA1C: No results for input(s): HGBA1C in the last 72 hours. CBG: No results for input(s): GLUCAP in the last 168 hours. Lipid Profile: No results for input(s): CHOL, HDL, LDLCALC, TRIG, CHOLHDL, LDLDIRECT in the last 72 hours. Thyroid Function Tests: No results for input(s): TSH, T4TOTAL, FREET4, T3FREE, THYROIDAB in the last 72 hours.  Anemia Panel: No results for input(s): VITAMINB12, FOLATE, FERRITIN, TIBC, IRON, RETICCTPCT in the last 72 hours. Sepsis Labs: No results for input(s): PROCALCITON, LATICACIDVEN in the last 168 hours.  Recent Results (from the past 240 hour(s))  Resp Panel by RT-PCR (Flu A&B, Covid) Nasopharyngeal Swab     Status: None   Collection Time: 11/07/20  1:27 AM   Specimen: Nasopharyngeal Swab; Nasopharyngeal(NP) swabs in vial transport medium  Result Value Ref Range Status   SARS Coronavirus 2 by RT PCR  NEGATIVE NEGATIVE Final    Comment: (NOTE) SARS-CoV-2 target nucleic acids are NOT DETECTED.  The SARS-CoV-2 RNA is generally detectable in upper  respiratory specimens during the acute phase of infection. The lowest concentration of SARS-CoV-2 viral copies this assay can detect is 138 copies/mL. A negative result does not preclude SARS-Cov-2 infection and should not be used as the sole basis for treatment or other patient management decisions. A negative result may occur with  improper specimen collection/handling, submission of specimen other than nasopharyngeal swab, presence of viral mutation(s) within the areas targeted by this assay, and inadequate number of viral copies(<138 copies/mL). A negative result must be combined with clinical observations, patient history, and epidemiological information. The expected result is Negative.  Fact Sheet for Patients:  EntrepreneurPulse.com.au  Fact Sheet for Healthcare Providers:  IncredibleEmployment.be  This test is no t yet approved or cleared by the [place] FDA and  has been authorized for detection and/or diagnosis of SARS-CoV-2 by FDA under an Emergency Use Authorization (EUA). This EUA will remain  in effect (meaning this test can be used) for the duration of the COVID-19 declaration under Section 564(b)(1) of the Act, 21 U.S.C.section 360bbb-3(b)(1), unless the authorization is terminated  or revoked sooner.       Influenza A by PCR NEGATIVE NEGATIVE Final   Influenza B by PCR NEGATIVE NEGATIVE Final    Comment: (NOTE) The Xpert Xpress SARS-CoV-2/FLU/RSV plus assay is intended as an aid in the diagnosis of influenza from Nasopharyngeal swab specimens and should not be used as a sole basis for treatment. Nasal washings and aspirates are unacceptable for Xpert Xpress SARS-CoV-2/FLU/RSV testing.  Fact Sheet for Patients: EntrepreneurPulse.com.au  Fact Sheet for  Healthcare Providers: IncredibleEmployment.be  This test is not yet approved or cleared by the [place] FDA and has been authorized for detection and/or diagnosis of SARS-CoV-2 by FDA under an Emergency Use Authorization (EUA). This EUA will remain in effect (meaning this test can be used) for the duration of the COVID-19 declaration under Section 564(b)(1) of the Act, 21 U.S.C. section 360bbb-3(b)(1), unless the authorization is terminated or revoked.  Performed at [facility], [place] [address] [place], [place] [address]   Culture, blood (routine x 2)     Status: None (Preliminary result)   Collection Time: 11/09/20  7:00 PM   Specimen: BLOOD RIGHT HAND  Result Value Ref Range Status   Specimen Description BLOOD RIGHT HAND  Final   Special Requests IN PEDIATRIC BOTTLE Blood Culture adequate volume  Final   Culture   Final    NO GROWTH < 12 HOURS Performed at [facility], [place] [address] [place], [place] [address]    Report Status PENDING  Incomplete  Culture, blood (routine x 2)     Status: None (Preliminary result)   Collection Time: 11/09/20  7:01 PM   Specimen: BLOOD  Result Value Ref Range Status   Specimen Description BLOOD RIGHT ANTECUBITAL  Final   Special Requests IN PEDIATRIC BOTTLE Blood Culture adequate volume  Final   Culture   Final    NO GROWTH < 12 HOURS Performed at [facility], [place] [address], [place],  [address]    Report Status PENDING  Incomplete     Radiology Studies: MR BRAIN W WO CONTRAST  Result Date: 11/09/2020 CLINICAL DATA:  45 year old female with chronic multiple sclerosis. New symptoms. EXAM: MRI HEAD WITHOUT AND WITH CONTRAST TECHNIQUE: Multiplanar, multiecho pulse sequences of the brain and surrounding structures were obtained without and with intravenous contrast. CONTRAST:  4.1mL GADAVIST GADOBUTROL 1 MMOL/ML IV SOLN COMPARISON:  Report of brain MRI 09/16/2002 (  no images available). FINDINGS:  Brain: Advanced brain volume loss for age. Extensive bilateral abnormal cerebral white matter signal and volume loss. Associated severe atrophy of the corpus callosum. Scattered subcortical white matter involvement, most pronounced in the right parietal lobe. And associated cortical involvement also most pronounced in the posterior right superior frontal and parietal lobes. Comparatively mild associated heterogeneity in the deep gray matter nuclei. But there is Wallerian degeneration in the right brainstem, and moderate heterogeneity in the pons and cerebellar peduncles. Cerebellum otherwise relatively spared. Mildly heterogeneous cervicomedullary junction. On DWI there is a punctate focus of restricted diffusion in the left superior frontal gyrus subcortical white matter (series 2, image 42). And there also appears to be a small area of cortical restricted diffusion along the right parieto-occipital lobe (series 2, image 35). But no other definite diffusion restriction. Postcontrast images are mildly motion degraded. No abnormal enhancement identified. No superimposed restricted diffusion suggestive of acute infarction. No midline shift, mass effect, evidence of mass lesion, ventriculomegaly, extra-axial collection or acute intracranial hemorrhage. Pituitary within normal limits. No chronic cerebral blood products identified. Vascular: Major intracranial vascular flow voids are preserved. Skull and upper cervical spine: Grossly negative visible cervical spine. Visualized bone marrow signal is within normal limits. Sinuses/Orbits: Symmetric orbits.  Paranasal sinuses are clear. Other: Trace left mastoid air cell fluid. Negative visible nasopharynx, right mastoids and other visible internal auditory structures. IMPRESSION: 1. Chronic severe demyelinating disease. Two superimposed small areas of diffusion restriction suspicious for active demyelination in the inferior right parietal lobe and left superior frontal  gyrus (punctate), although no definite enhancement. 2. No other acute intracranial abnormality. Electronically Signed   By: Genevie Ann M.D.   On: 11/09/2020 11:20     LOS: 3 days   Antonieta Pert, MD [facility]  11/10/2020, 11:03 AM

## 2020-11-10 NOTE — Progress Notes (Signed)
MEWS is driven by the Systolic BP, HR and RR. Pt is till running bolus to elevate BP

## 2020-11-11 DIAGNOSIS — R627 Adult failure to thrive: Secondary | ICD-10-CM

## 2020-11-11 LAB — BASIC METABOLIC PANEL
Anion gap: 9 (ref 5–15)
BUN: 21 mg/dL — ABNORMAL HIGH (ref 6–20)
CO2: 30 mmol/L (ref 22–32)
Calcium: 7.7 mg/dL — ABNORMAL LOW (ref 8.9–10.3)
Chloride: 102 mmol/L (ref 98–111)
Creatinine, Ser: 0.7 mg/dL (ref 0.44–1.00)
GFR, Estimated: >60 mL/min (ref >60)
Glucose, Bld: 118 mg/dL — ABNORMAL HIGH (ref 70–99)
Potassium: 4.5 mmol/L (ref 3.5–5.1)
Sodium: 141 mmol/L (ref 135–145)

## 2020-11-11 LAB — CALCIUM, IONIZED
Calcium, Ionized, Serum: 4.5 mg/dL (ref 4.5–5.6)
Calcium, Ionized, Serum: 3.8 mg/dL — ABNORMAL LOW (ref 4.5–5.6)

## 2020-11-11 LAB — MAGNESIUM: Magnesium: 2.5 mg/dL — ABNORMAL HIGH (ref 1.7–2.4)

## 2020-11-11 LAB — SARS CORONAVIRUS 2 (TAT 6-24 HRS): SARS Coronavirus 2: NEGATIVE

## 2020-11-11 MED ORDER — ENSURE ENLIVE PO LIQD
237.0000 mL | Freq: Two times a day (BID) | ORAL | Status: DC
  Administered 2020-11-12 (×2): 237 mL via ORAL

## 2020-11-11 NOTE — Progress Notes (Signed)
Initial Nutrition Assessment  DOCUMENTATION CODES:   Not applicable  INTERVENTION:  Ensure Enlive po TID, each supplement provides 350 kcal and 20 grams of protein  NUTRITION DIAGNOSIS:   Increased nutrient needs related to wound healing as evidenced by estimated needs.   GOAL:   Patient will meet greater than or equal to 90% of their needs   MONITOR:   PO intake, Supplement acceptance, Skin, Weight trends  REASON FOR ASSESSMENT:   Malnutrition Screening Tool    ASSESSMENT:   Pt admitted from [place] for persistent hypocalcemia. Previous hospital admission 9/12-9/25 for hypercalcemia. PMH includes MS, spastic paraplegia secondary MS, multiple decubitus ulcers over sacral area, PCM, and hypothyroidism.  Pt was a limited history as she was drowsy. She denies any trouble chewing, swallowing, or holding utensils. She has some L sided weakness d/t her MS.   Meal Completion:  10/8: 90-95% x 2 meals 10/9: 50% x 1 meal 10/10: 80-100% x 2 meals 10/11: 80% x 1 meal  Hypocalcemia d/t recent use of pamidronate for hypercalcemia. IV calcium given. Corrected calcium improved to 9.  Pt denies any wt loss since last admission. Wt fluctuations noted in chart review. Since last admit, it appears pt weight has increased however, muscle and fat losses have increased and noted continued pressure injuries. Pt would benefit from a nutrition supplement. Unable to get a good history on pt's eating patterns since last admission.  Medications: risaquad, vitamin C, dulcolax, rocaltrol, tums, heparin, mag-ox, protonix, miralax  Labs: BUN 21, Mg 2.5  NUTRITION - FOCUSED PHYSICAL EXAM:  Flowsheet Row Most Recent Value  Orbital Region Mild depletion  Upper Arm Region Severe depletion  Thoracic and Lumbar Region Severe depletion  Buccal Region Mild depletion  Temple Region Mild depletion  Clavicle Bone Region Severe depletion  Clavicle and Acromion Bone Region Severe depletion   Scapular Bone Region Severe depletion  Dorsal Hand Moderate depletion  Patellar Region Severe depletion  Anterior Thigh Region Severe depletion  Posterior Calf Region Severe depletion  Edema (RD Assessment) None  Hair Reviewed  Eyes Reviewed  Mouth Reviewed  [missing teeth]  Skin Reviewed  Nails Reviewed       Diet Order:   Diet Order             Diet regular Room service appropriate? Yes; Fluid consistency: Thin  Diet effective now                   EDUCATION NEEDS:   No education needs have been identified at this time  Skin:  Skin Assessment: Skin Integrity Issues: Skin Integrity Issues:: Stage IV, Unstageable Stage II: R knee; L hip Stage III: R buttocks Stage IV: L knee Unstageable: L foot  Last BM:  11/10/20  Height:   Ht Readings from Last 1 Encounters:  10/15/20 4\' 11"  (1.499 m)    Weight:   Wt Readings from Last 1 Encounters:  11/11/20 47.6 kg    BMI:  Body mass index is 21.21 kg/m.  Estimated Nutritional Needs:   Kcal:  1400-1600  Protein:  70-80g  Fluid:  >1.4L   Clayborne Dana, RDN, LDN Clinical Nutrition

## 2020-11-11 NOTE — Progress Notes (Signed)
PROGRESS NOTE    Deanna Baldwin  INO:[id number] DOB: [birth year] DOA: 11/06/2020 PCP: [name], MD   Chief Complaint  Patient presents with   abnormal labs   Brief Narrative/Hospital Course: 45 old female with history of severe MS with spastic paraplegia contracture, multiple decubitus ulcer over sacral area, protein calorie moderation hypothyroidism recent admission for severe hypercalcemia due to dehydration treated with calcitonin and pamidronate subsequently had hypo calcium and that was aggressively repleted and discharged to skilled nursing facility/nursing home returns to the ED with abnormal labs with low calcium. In the ED patient was asymptomatic, vitals stable labs showed calcium 4.111 3.0 mag 1.1 phosphorus 8.3 ionized calcium 0.38 given 1 g calcium calcium gluconate magnesium and admitted Patient further received IV magnesium and calcium and discussed w/ nephrologist Nursing home staff/sister reported episode of seizure on 10/3 at [facility], so neurology was consulted. EEG no acute finding, MRI abnormal with diffusion restriction-possible active demyelination differential stroke, neuro advise no further treatment given high risk of steroid, advised to rule out endocarditis with echocardiogram blood culture.  Palliative care was consulted patient was changed to limited code no CPR okay for intubation  Subjective: Seen and examined this morning.  She was feeding herself no complaints alert awake oriented at baseline.  Assessment & Plan:  Hypocalcemia:Multifactorial suspect due to recent use of pamidronate for hypercalcemia. So far IV calcium gluconate 3 g since admission.  Calcium is now stabilized improved corrected calcium at 9-today and doing well on oral  calcitriol bid, Tums 3 tablets 4 times daily.  Mag also stable.  If calcium and mag remained stable on current regimen can discharge back to facility tomorrow-with instruction to check labs in 4 to 5 days at facility. I had  discussed with nephrology [name] previously.  Hypomagnesemia: Mag has improved - on higher side 2.5 this morning. Continue increased dose of Mag-Ox continue twice daily repeat in a.m.   Mild metabolic acidosis resolved.    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 Neruo advised to rule out bacterial infection with TTE and blood culture as diffusion restriction could be due to stroke embolic.  Echocardiogram unremarkable.  Blood cultures pending   Episode of seizure at [facility] on 11/03/20-informed by facility nursing staff/sister  on 10/7-possible provoked seizure at risk of developing epilepsy.  Seen by neurology plan is to continue on Keppra 500 mg BID on long-term and outpatient follow-up.  Monitor electrolytes as outpatient  Decubitus ulcer of sacral region, unstageable POA continue routine wound care offloading.  Severe protein calorie malnutrition: Patient reports she has "high metabolism" can she can eat orally and not gain weight.  Continue to augment diet as per dietitian.  Chronic constipation supportive care laxatives as needed.  Refused MiraLAX yesterday. Anemia of chronic disease Hypotension on midodrine.  Systolic blood pressure at times ranges from 80s to 90s, also gets tachycardic which is -not new for her.  This drives her  MEWS to red.Patient is largely asymptomatic.   Hypothyroidism continue Synthroid.  Goals of care palliative care consulted overall prognosis guarded.  Code status changed to limited code - no CPR okay for intubation  Diet Order             Diet regular  Room service appropriate? Yes;  Fluid consistency: Thin  Diet effective now                   DVT prophylaxis: heparin injection 5,000 Units Start: 11/07/20 0045 Foot Pump / plexipulse Start: 11/07/20 0040 Code Status:   Code Status: Partial Code  Family Communication: plan of care discussed with patient at bedside.  Tried to call sister no answer.  Status is: Inpatient Patient remains hospitalized will need at least 2 midnight care for management of severe hypocalcemia needing IV replacement Dispo: The patient is from:  [facility] home              Anticipated d/c is to:  [place] home tomorrow if her electrolytes remains stable and blood culture negative.               Patient currently is not medically stable to d/c.   Difficult to place patient No  Objective: Vitals: Today's Vitals   11/11/20 0413 11/11/20 0803 11/11/20 0926 11/11/20 1000  BP: 99/67 91/68 97/67    Pulse: 99 (!) 101 (!) 109   Resp: 18 19 (!) 26   Temp: 98.5 F (36.9 C) 98.7 F (37.1 C)    TempSrc: Oral Oral    SpO2:  97% 96%   Weight: 47.6 kg     PainSc:   0-No pain 0-No pain   Physical Examination: General exam: AAOx 3, at baseline, thin frail cachectic,older than stated age, weak appearing. HEENT:Oral mucosa moist, Ear/Nose WNL grossly, dentition normal. Respiratory system: bilaterally clear breath sounds, no use of accessory muscle Cardiovascular system: S1 & S2 +, No JVD,. Gastrointestinal system: Abdomen soft, urostomy in place NT,ND, BS+ Nervous System:Alert, awake, with lower extremity contractures  Extremities: no edema, distal peripheral pulses palpable.  Skin: No rashes,no icterus. MSK: small muscle bulk,tone, power   Medications reviewed:  Scheduled Meds:  acidophilus  1 capsule Oral Daily   ascorbic acid  500 mg Oral q AM   baclofen  10 mg Oral TID   bisacodyl  5 mg Oral Daily   calcitRIOL  0.25 mcg Oral BID   calcium carbonate  3 tablet Oral TID WC & HS   heparin  5,000 Units  Subcutaneous Q8H   influenza vac split quadrivalent PF  0.5 mL Intramuscular Tomorrow-1000   levothyroxine  100 mcg Oral QAC breakfast   magnesium oxide  800 mg Oral BID   midodrine  2.5 mg Oral TID WC   pantoprazole  40 mg Oral Daily   polyethylene glycol  17 g Oral Daily   tiZANidine  2 mg Oral TID   Continuous Infusions:  sodium chloride Stopped (11/10/20 2153)   levETIRAcetam 500 mg (11/11/20 3762)    Intake/Output  Intake/Output Summary (Last 24 hours) at 11/11/2020 1022 Last data filed at 11/11/2020 0900 Gross per 24 hour  Intake 1782.5 ml  Output 1575 ml  Net 207.5 ml    Intake/Output from previous day: 10/10 0701 - 10/11 0700 In: 1542.5 [P.O.:1200; IV Piggyback:342.5] Out: 8315 [VVOHY:0737] Net IO Since Admission: 1,073.92 mL [11/11/20 1022]   Weight change:   Wt Readings from Last 3 Encounters:  11/11/20 47.6 kg  10/24/20 42.7 kg  09/28/18 43.1 kg     Consultants:see note  Procedures:see note Antimicrobials: Anti-infectives (From admission, onward)    None      Culture/Microbiology    Component Value Date/Time   SDES BLOOD RIGHT ANTECUBITAL 11/09/2020 1901   SPECREQUEST IN PEDIATRIC BOTTLE  Blood Culture adequate volume 11/09/2020 1901   CULT  11/09/2020 1901    NO GROWTH 2 DAYS Performed at [facility], [place] [address], [place], [place] [address]    REPTSTATUS PENDING 11/09/2020 1901    Other culture-see note  Unresulted Labs (From admission, onward)     Start     Ordered   11/11/20 8786  Basic metabolic panel  Daily,   R     Question:  Specimen collection method  Answer:  Lab=Lab collect   11/10/20 0822   11/07/20 2149  calcium, ionized  (calcium gluconate and labs)  5A & 5P,   R (with TIMED occurrences),   Status:  Canceled     Question:  Specimen collection method  Answer:  Lab=Lab collect   11/07/20 2148            Data Reviewed: I have personally reviewed following labs and imaging studies CBC: Recent Labs  Lab  11/06/20 1700 11/06/20 1707  WBC  --  9.5  NEUTROABS  --  7.6  HGB 12.9 10.3*  HCT 38.0 33.6*  MCV  --  94.1  PLT  --  653*    Basic Metabolic Panel: Recent Labs  Lab 11/08/20 0226 11/08/20 1629 11/09/20 0458 11/09/20 1707 11/10/20 0550 11/11/20 0046  NA 140 143 144 141 140 141  K 4.1 4.5 5.0 5.0 4.8 4.5  CL 105 108 105 102 102 102  CO2 23 24 27 26 29 30   GLUCOSE 144* 119* 98 110* 99 118*  BUN 15 15 17 17  23* 21*  CREATININE 0.69 0.57 0.57 0.66 0.70 0.70  CALCIUM 6.9* 6.5* 7.9* 7.2* 6.8* 7.7*  MG 1.9 1.5* 2.0 1.6* 1.4* 2.5*  PHOS 5.5* 5.5* 6.0* 5.1* 4.7*  --     GFR: Estimated Creatinine Clearance: 61.2 mL/min (by C-G formula based on SCr of 0.7 mg/dL). Liver Function Tests: Recent Labs  Lab 11/06/20 1707 11/07/20 0500 11/08/20 0226 11/09/20 0458 11/10/20 0550  AST 19 17 13* 16 17  ALT 10 11 10 9 12   ALKPHOS 142* 135* 116 106 94  BILITOT 0.2* 0.5 0.2* 0.2* 0.5  PROT 7.8 7.2 6.5 6.7 6.6  ALBUMIN 3.0* 2.8* 2.3* 2.5* 2.3*    No results for input(s): LIPASE, AMYLASE in the last 168 hours. No results for input(s): AMMONIA in the last 168 hours. Coagulation Profile: No results for input(s): INR, PROTIME in the last 168 hours. Cardiac Enzymes: No results for input(s): CKTOTAL, CKMB, CKMBINDEX, TROPONINI in the last 168 hours. BNP (last 3 results) No results for input(s): PROBNP in the last 8760 hours. HbA1C: No results for input(s): HGBA1C in the last 72 hours. CBG: No results for input(s): GLUCAP in the last 168 hours. Lipid Profile: No results for input(s): CHOL, HDL, LDLCALC, TRIG, CHOLHDL, LDLDIRECT in the last 72 hours. Thyroid Function Tests: No results for input(s): TSH, T4TOTAL, FREET4, T3FREE, THYROIDAB in the last 72 hours.  Anemia Panel: No results for input(s): VITAMINB12, FOLATE, FERRITIN, TIBC, IRON, RETICCTPCT in the last 72 hours. Sepsis Labs: No results for input(s): PROCALCITON, LATICACIDVEN in the last 168 hours.  Recent Results  (from the past 240 hour(s))  Resp Panel by RT-PCR (Flu A&B, Covid) Nasopharyngeal Swab     Status: None   Collection Time: 11/07/20  1:27 AM   Specimen: Nasopharyngeal Swab; Nasopharyngeal(NP) swabs in vial transport medium  Result Value Ref Range Status   SARS Coronavirus 2 by RT PCR NEGATIVE NEGATIVE Final    Comment: (  NOTE) SARS-CoV-2 target nucleic acids are NOT DETECTED.  The SARS-CoV-2 RNA is generally detectable in upper respiratory specimens during the acute phase of infection. The lowest concentration of SARS-CoV-2 viral copies this assay can detect is 138 copies/mL. A negative result does not preclude SARS-Cov-2 infection and should not be used as the sole basis for treatment or other patient management decisions. A negative result may occur with  improper specimen collection/handling, submission of specimen other than nasopharyngeal swab, presence of viral mutation(s) within the areas targeted by this assay, and inadequate number of viral copies(<138 copies/mL). A negative result must be combined with clinical observations, patient history, and epidemiological information. The expected result is Negative.  Fact Sheet for Patients:  EntrepreneurPulse.com.au  Fact Sheet for Healthcare Providers:  IncredibleEmployment.be  This test is no t yet approved or cleared by the [place] FDA and  has been authorized for detection and/or diagnosis of SARS-CoV-2 by FDA under an Emergency Use Authorization (EUA). This EUA will remain  in effect (meaning this test can be used) for the duration of the COVID-19 declaration under Section 564(b)(1) of the Act, 21 U.S.C.section 360bbb-3(b)(1), unless the authorization is terminated  or revoked sooner.       Influenza A by PCR NEGATIVE NEGATIVE Final   Influenza B by PCR NEGATIVE NEGATIVE Final    Comment: (NOTE) The Xpert Xpress SARS-CoV-2/FLU/RSV plus assay is intended as an aid in the  diagnosis of influenza from Nasopharyngeal swab specimens and should not be used as a sole basis for treatment. Nasal washings and aspirates are unacceptable for Xpert Xpress SARS-CoV-2/FLU/RSV testing.  Fact Sheet for Patients: EntrepreneurPulse.com.au  Fact Sheet for Healthcare Providers: IncredibleEmployment.be  This test is not yet approved or cleared by the [place] FDA and has been authorized for detection and/or diagnosis of SARS-CoV-2 by FDA under an Emergency Use Authorization (EUA). This EUA will remain in effect (meaning this test can be used) for the duration of the COVID-19 declaration under Section 564(b)(1) of the Act, 21 U.S.C. section 360bbb-3(b)(1), unless the authorization is terminated or revoked.  Performed at [facility], [place] [address], [place], [place] [address]   Culture, blood (routine x 2)     Status: None (Preliminary result)   Collection Time: 11/09/20  7:00 PM   Specimen: BLOOD RIGHT HAND  Result Value Ref Range Status   Specimen Description BLOOD RIGHT HAND  Final   Special Requests IN PEDIATRIC BOTTLE Blood Culture adequate volume  Final   Culture   Final    NO GROWTH 2 DAYS Performed at [facility], [place] [address] [place], [place] [address]    Report Status PENDING  Incomplete  Culture, blood (routine x 2)     Status: None (Preliminary result)   Collection Time: 11/09/20  7:01 PM   Specimen: BLOOD  Result Value Ref Range Status   Specimen Description BLOOD RIGHT ANTECUBITAL  Final   Special Requests IN PEDIATRIC BOTTLE Blood Culture adequate volume  Final   Culture   Final    NO GROWTH 2 DAYS Performed at [facility], [place] [address], [place], [place] [address]    Report Status PENDING  Incomplete     Radiology Studies: MR BRAIN W WO CONTRAST  Result Date: 11/09/2020 CLINICAL DATA:  45 year old female with chronic multiple sclerosis. New symptoms. EXAM: MRI HEAD WITHOUT AND  WITH CONTRAST TECHNIQUE: Multiplanar, multiecho pulse sequences of the brain and surrounding structures were obtained without and with intravenous contrast.  CONTRAST:  4.54mL GADAVIST GADOBUTROL 1 MMOL/ML IV SOLN COMPARISON:  Report of brain MRI 09/16/2002 (no images available). FINDINGS: Brain: Advanced brain volume loss for age. Extensive bilateral abnormal cerebral white matter signal and volume loss. Associated severe atrophy of the corpus callosum. Scattered subcortical white matter involvement, most pronounced in the right parietal lobe. And associated cortical involvement also most pronounced in the posterior right superior frontal and parietal lobes. Comparatively mild associated heterogeneity in the deep gray matter nuclei. But there is Wallerian degeneration in the right brainstem, and moderate heterogeneity in the pons and cerebellar peduncles. Cerebellum otherwise relatively spared. Mildly heterogeneous cervicomedullary junction. On DWI there is a punctate focus of restricted diffusion in the left superior frontal gyrus subcortical white matter (series 2, image 42). And there also appears to be a small area of cortical restricted diffusion along the right parieto-occipital lobe (series 2, image 35). But no other definite diffusion restriction. Postcontrast images are mildly motion degraded. No abnormal enhancement identified. No superimposed restricted diffusion suggestive of acute infarction. No midline shift, mass effect, evidence of mass lesion, ventriculomegaly, extra-axial collection or acute intracranial hemorrhage. Pituitary within normal limits. No chronic cerebral blood products identified. Vascular: Major intracranial vascular flow voids are preserved. Skull and upper cervical spine: Grossly negative visible cervical spine. Visualized bone marrow signal is within normal limits. Sinuses/Orbits: Symmetric orbits.  Paranasal sinuses are clear. Other: Trace left mastoid air cell fluid. Negative  visible nasopharynx, right mastoids and other visible internal auditory structures. IMPRESSION: 1. Chronic severe demyelinating disease. Two superimposed small areas of diffusion restriction suspicious for active demyelination in the inferior right parietal lobe and left superior frontal gyrus (punctate), although no definite enhancement. 2. No other acute intracranial abnormality. Electronically Signed   By: Genevie Ann M.D.   On: 11/09/2020 11:20   ECHOCARDIOGRAM COMPLETE  Result Date: 11/10/2020    ECHOCARDIOGRAM REPORT   Patient Name:   RUSTI ARIZMENDI Date of Exam: 11/10/2020 Medical Rec #:  [id number]     Height:       59.0 in Accession #:    [id number]    Weight:       92.0 lb Date of Birth:  January 06, 1976     BSA:          1.326 m Patient Age:    46 years      BP:           93/62 mmHg Patient Gender: F             HR:           115 bpm. Exam Location:  Inpatient Procedure: 2D Echo, Cardiac Doppler and Color Doppler Indications:    CVA  History:        Patient has prior history of Echocardiogram examinations, most                 recent 08/03/2016. Stroke; Signs/Symptoms:Hypotension. Severe                 multiple sclerosis, paraplegia.  Sonographer:    Dustin Flock RDCS Referring Phys: [id number] [place]  1. Left ventricular ejection fraction, by estimation, is 65 to 70%. The left ventricle has normal function. The left ventricle has no regional wall motion abnormalities. Left ventricular diastolic parameters were normal.  2. Right ventricular systolic function is normal. The right ventricular size is normal. There is normal pulmonary artery systolic pressure.  3. The mitral valve is abnormal. Trivial mitral valve regurgitation. No evidence  of mitral stenosis.  4. The aortic valve is tricuspid. Aortic valve regurgitation is not visualized. No aortic stenosis is present.  5. The inferior vena cava is normal in size with greater than 50% respiratory variability, suggesting right atrial  pressure of 3 mmHg. Comparison(s): No significant change from prior study. Conclusion(s)/Recommendation(s): Normal biventricular function without evidence of hemodynamically significant valvular heart disease. No intracardiac source of embolism detected on this transthoracic study. A transesophageal echocardiogram is recommended to exclude cardiac source of embolism if clinically indicated. FINDINGS  Left Ventricle: Left ventricular ejection fraction, by estimation, is 65 to 70%. The left ventricle has normal function. The left ventricle has no regional wall motion abnormalities. The left ventricular internal cavity size was normal in size. There is  no left ventricular hypertrophy. Left ventricular diastolic parameters were normal. Right Ventricle: The right ventricular size is normal. No increase in right ventricular wall thickness. Right ventricular systolic function is normal. There is normal pulmonary artery systolic pressure. The tricuspid regurgitant velocity is 2.26 m/s, and  with an assumed right atrial pressure of 3 mmHg, the estimated right ventricular systolic pressure is 14.4 mmHg. Left Atrium: Left atrial size was normal in size. Right Atrium: Right atrial size was normal in size. Pericardium: There is no evidence of pericardial effusion. Mitral Valve: MIld mitral valve bowing without prolapse. Independently mobile linear structure in subvalvular apparatus, suspect chordae. The mitral valve is abnormal. Trivial mitral valve regurgitation. No evidence of mitral valve stenosis. Tricuspid Valve: The tricuspid valve is normal in structure. Tricuspid valve regurgitation is trivial. No evidence of tricuspid stenosis. Aortic Valve: The aortic valve is tricuspid. Aortic valve regurgitation is not visualized. No aortic stenosis is present. Pulmonic Valve: The pulmonic valve was not well visualized. Pulmonic valve regurgitation is not visualized. No evidence of pulmonic stenosis. Aorta: The aortic root,  ascending aorta, aortic arch and descending aorta are all structurally normal, with no evidence of dilitation or obstruction. Venous: The inferior vena cava is normal in size with greater than 50% respiratory variability, suggesting right atrial pressure of 3 mmHg. IAS/Shunts: No atrial level shunt detected by color flow Doppler.  LEFT VENTRICLE PLAX 2D LVIDd:         4.30 cm   Diastology LVIDs:         2.70 cm   LV e' medial:    9.68 cm/s LV PW:         0.90 cm   LV E/e' medial:  7.6 LV IVS:        0.90 cm   LV e' lateral:   8.70 cm/s LVOT diam:     2.10 cm   LV E/e' lateral: 8.4 LV SV:         67 LV SV Index:   50 LVOT Area:     3.46 cm  RIGHT VENTRICLE RV Basal diam:  2.20 cm RV S prime:     11.50 cm/s TAPSE (M-mode): 1.9 cm LEFT ATRIUM             Index        RIGHT ATRIUM          Index LA diam:        2.30 cm 1.73 cm/m   RA Area:     9.29 cm LA Vol (A2C):   24.3 ml 18.33 ml/m  RA Volume:   16.60 ml 12.52 ml/m LA Vol (A4C):   28.6 ml 21.57 ml/m LA Biplane Vol: 28.2 ml 21.27 ml/m  AORTIC VALVE LVOT  Vmax:   105.00 cm/s LVOT Vmean:  76.600 cm/s LVOT VTI:    0.193 m  AORTA Ao Root diam: 2.80 cm MITRAL VALVE               TRICUSPID VALVE MV Area (PHT): 7.09 cm    TR Peak grad:   20.4 mmHg MV Decel Time: 107 msec    TR Vmax:        226.00 cm/s MV E velocity: 73.20 cm/s MV A velocity: 99.40 cm/s  SHUNTS MV E/A ratio:  0.74        Systemic VTI:  0.19 m                            Systemic Diam: 2.10 cm Buford Dresser MD Electronically signed by Buford Dresser MD Signature Date/Time: 11/10/2020/3:55:31 PM    Final      LOS: 4 days   Antonieta Pert, MD [facility]  11/11/2020, 10:22 AM

## 2020-11-11 NOTE — Progress Notes (Signed)
Patient resting comfortably with no complaints. Denies pain and any needs at this time.

## 2020-11-11 NOTE — Care Management Important Message (Signed)
Important Message  Patient Details  Name: Deanna Baldwin MRN: [id number] Date of Birth: October 17, 1975   Medicare Important Message Given:  Yes     Shelda Altes 11/11/2020, 9:38 AM

## 2020-11-12 DIAGNOSIS — G8220 Paraplegia, unspecified: Secondary | ICD-10-CM | POA: Diagnosis not present

## 2020-11-12 DIAGNOSIS — Z66 Do not resuscitate: Secondary | ICD-10-CM | POA: Diagnosis not present

## 2020-11-12 DIAGNOSIS — L89150 Pressure ulcer of sacral region, unstageable: Secondary | ICD-10-CM | POA: Diagnosis not present

## 2020-11-12 DIAGNOSIS — R627 Adult failure to thrive: Secondary | ICD-10-CM | POA: Diagnosis not present

## 2020-11-12 LAB — BASIC METABOLIC PANEL
Anion gap: 10 (ref 5–15)
BUN: 24 mg/dL — ABNORMAL HIGH (ref 6–20)
CO2: 29 mmol/L (ref 22–32)
Calcium: 9.1 mg/dL (ref 8.9–10.3)
Chloride: 101 mmol/L (ref 98–111)
Creatinine, Ser: 0.79 mg/dL (ref 0.44–1.00)
GFR, Estimated: >60 mL/min (ref >60)
Glucose, Bld: 126 mg/dL — ABNORMAL HIGH (ref 70–99)
Potassium: 4.6 mmol/L (ref 3.5–5.1)
Sodium: 140 mmol/L (ref 135–145)

## 2020-11-12 LAB — MAGNESIUM: Magnesium: 1.7 mg/dL (ref 1.7–2.4)

## 2020-11-12 MED ORDER — CALCIUM CARBONATE ANTACID 500 MG PO CHEW: 3.0000 | CHEWABLE_TABLET | Freq: Three times a day (TID) | ORAL | Status: DC

## 2020-11-12 MED ORDER — CALCITRIOL 0.25 MCG PO CAPS: 0.2500 ug | ORAL_CAPSULE | Freq: Two times a day (BID) | ORAL | Status: AC

## 2020-11-12 MED ORDER — MAGNESIUM OXIDE -MG SUPPLEMENT 400 (240 MG) MG PO TABS: 800.0000 mg | ORAL_TABLET | Freq: Two times a day (BID) | ORAL | Status: AC

## 2020-11-12 NOTE — [place] FL2 (Signed)
[place] MEDICAID FL2 LEVEL OF CARE SCREENING TOOL     IDENTIFICATION  Patient Name: Deanna Baldwin Birthdate: Jan 12, 1976 Sex: female Admission Date (Current Location): 11/06/2020  [place] and [place] Number:  Kathleen Argue [id number] [place] and Address:  [facility], [place] [address] [place], [place] [address]      Provider Number: [id number]  Attending Physician Name and Address:  Antonieta Pert, MD  Relative Name and Phone Number:  Crystal sister [phone], aunt [phone],    Current Level of Care: Hospital Recommended Level of Care: Nursing Facility Prior Approval Number:    Date Approved/Denied:   PASRR Number:    Discharge Plan: SNF    Current Diagnoses: Patient Active Problem List   Diagnosis Date Noted   Adult failure to thrive    Pressure injury of skin 10/18/2020   Multiple sclerosis ([place]) 05/06/2020   DNR (do not resuscitate) discussion 03/08/2018   Atelectasis    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[place]) 05/23/2017   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   Hypothyroidism 04/09/2017   Hypocalcemia 04/08/2017   Anemia 04/08/2017   Osteomyelitis ([place]) 03/19/2017   Acute osteomyelitis of left pelvic region and thigh ([place])    Paraplegia ([place]) 03/09/2017   Chronic constipation 02/13/2017   Chronic pain 12/13/2016   Hypotension 08/31/2016   Vitamin B 12 deficiency 07/09/2016   Pressure ulcer, stage 4 ([place]) 07/08/2016   Sepsis ([place]) 07/07/2016   Unintentional weight loss 07/06/2016   Relapsing remitting multiple sclerosis ([place]) 06/08/2016   Neurogenic bladder 04/09/2016   Anemia of chronic disease 04/06/2016   Dysphagia, oral phase 03/17/2016   Palliative care by specialist    Decubitus ulcer of sacral region, unstageable ([place])    Protein-calorie malnutrition, severe ([place])    Spastic 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 ([place]) 03/02/2016   Elevated liver function tests 03/02/2016    Orientation RESPIRATION BLADDER Height & Weight      Self, Time, Situation, Place (WDL)  Normal Incontinent (Urostomy) Weight: 105 lb (47.6 kg) Height:     BEHAVIORAL SYMPTOMS/MOOD NEUROLOGICAL BOWEL NUTRITION STATUS      Incontinent Diet (Please see discharge summary)  AMBULATORY STATUS COMMUNICATION OF NEEDS Skin   Total Care Verbally Other (Comment) (PI heel L Stage 4,PI buttocks R stage 3,PI knee R;medial stage 2,PI Foot L;Lateral unstageable,PI hip L stage 2,wound/incision open or dehiced Labia R;posterior)                       Personal Care Assistance Level of Assistance  Bathing, Feeding, Dressing Bathing Assistance: Maximum assistance Feeding assistance: Limited assistance (Need set up; Able to feed self) Dressing Assistance: Maximum assistance     Functional Limitations Info  Sight, Hearing, Speech Sight Info: Adequate Hearing Info: Adequate Speech Info: Adequate    SPECIAL CARE FACTORS FREQUENCY                       Contractures Contractures Info: Not present    Additional Factors Info  Code Status, Allergies Code Status Info: FULL Allergies Info: Natalizumab           Current Medications (11/12/2020):  This is the current hospital active medication list Current Facility-Administered Medications  Medication Dose Route Frequency Provider Last Rate Last Admin   0.9 %  sodium chloride infusion   Intravenous PRN Antonieta Pert, MD   Stopped at 11/11/20 2221   acetaminophen (TYLENOL) tablet 650 mg  650 mg Oral Q6H PRN Kristopher Oppenheim, DO  Or   acetaminophen (TYLENOL) suppository 650 mg  650 mg Rectal Q6H PRN Kristopher Oppenheim, DO       acidophilus (RISAQUAD) capsule 1 capsule  1 capsule Oral Daily Kristopher Oppenheim, DO   1 capsule at 11/12/20 9833   ascorbic acid (VITAMIN C) tablet 500 mg  500 mg Oral q AM Kristopher Oppenheim, DO   500 mg at 11/12/20 8250   baclofen (LIORESAL) tablet 10 mg  10 mg Oral TID Kristopher Oppenheim, DO   10 mg at 11/12/20 5397   bisacodyl (DULCOLAX) EC tablet 5 mg  5 mg Oral Daily Kristopher Oppenheim, DO   5 mg at  11/12/20 1014   calcitRIOL (ROCALTROL) capsule 0.25 mcg  0.25 mcg Oral BID Antonieta Pert, MD   0.25 mcg at 11/12/20 0958   calcium carbonate (TUMS - dosed in mg elemental calcium) chewable tablet 600 mg of elemental calcium  3 tablet Oral TID WC & HS Kc, Ramesh, MD   600 mg of elemental calcium at 11/12/20 0958   feeding supplement (ENSURE ENLIVE / ENSURE PLUS) liquid 237 mL  237 mL Oral BID BM Kc, Ramesh, MD   237 mL at 11/12/20 0955   heparin injection 5,000 Units  5,000 Units Subcutaneous Q8H Kristopher Oppenheim, DO   5,000 Units at 11/12/20 0655   levETIRAcetam (KEPPRA) IVPB 500 mg/100 mL premix  500 mg Intravenous Q12H Bhagat, Srishti L, MD 400 mL/hr at 11/12/20 1006 500 mg at 11/12/20 1006   levothyroxine (SYNTHROID) tablet 100 mcg  100 mcg Oral QAC breakfast Kristopher Oppenheim, DO   100 mcg at 11/12/20 0958   LORazepam (ATIVAN) injection 2 mg  2 mg Intravenous Q6H PRN Kc, Maren [place], MD   2 mg at 11/09/20 0950   magnesium oxide (MAG-OX) tablet 800 mg  800 mg Oral BID Kc, Maren [place], MD   800 mg at 11/12/20 0958   midodrine (PROAMATINE) tablet 2.5 mg  2.5 mg Oral TID WC Kristopher Oppenheim, DO   2.5 mg at 11/12/20 0958   pantoprazole (PROTONIX) EC tablet 40 mg  40 mg Oral Daily Kristopher Oppenheim, DO   40 mg at 11/12/20 0958   polyethylene glycol (MIRALAX / GLYCOLAX) packet 17 g  17 g Oral Daily Kristopher Oppenheim, DO   17 g at 11/12/20 6734   tiZANidine (ZANAFLEX) tablet 2 mg  2 mg Oral TID Kristopher Oppenheim, DO   2 mg at 11/12/20 1014     Discharge Medications: Please see discharge summary for a list of discharge medications.  Relevant Imaging Results:  Relevant Lab Results:   Additional Information [id number]  Milas Gain, LCSWA

## 2020-11-12 NOTE — Progress Notes (Signed)
Daily Progress Note   Patient Name: Deanna Baldwin       Date: 11/12/2020 DOB: September 05, 1975  Age: 45 y.o. MRN#: [id number] Attending Physician: Antonieta Pert, MD Primary Care Physician: [name], MD Admit Date: 11/06/2020  Reason for Consultation/Follow-up: Establishing goals of care  Subjective:  Patient "feels good" today.  Now complaints of pain.  "My MS has not been that bad, I never have pain or suffering"   Patient and family wish they could find a "better facility" for her long term care.        Discussed with LCSW, family instructed to talk with facility.  Options are limited for long term Medicaid beds.   Education offered on the likely trajectory of end-stage MS.  Length of Stay: 5  Current Medications: Scheduled Meds:  . acidophilus  1 capsule Oral Daily  . ascorbic acid  500 mg Oral q AM  . baclofen  10 mg Oral TID  . bisacodyl  5 mg Oral Daily  . calcitRIOL  0.25 mcg Oral BID  . calcium carbonate  3 tablet Oral TID WC & HS  . feeding supplement  237 mL Oral BID BM  . heparin  5,000 Units Subcutaneous Q8H  . levothyroxine  100 mcg Oral QAC breakfast  . magnesium oxide  800 mg Oral BID  . midodrine  2.5 mg Oral TID WC  . pantoprazole  40 mg Oral Daily  . polyethylene glycol  17 g Oral Daily  . tiZANidine  2 mg Oral TID    Continuous Infusions: . sodium chloride Stopped (11/11/20 2221)  . levETIRAcetam Stopped (11/11/20 2252)    PRN Meds: sodium chloride, acetaminophen **OR** acetaminophen, LORazepam  Physical Exam Cardiovascular:     Rate and Rhythm: Normal rate.  Pulmonary:     Effort: Pulmonary effort is normal.  Musculoskeletal:        General: Deformity present. Normal range of motion.     Comments: BLE contractures   Skin:    General: Skin is  warm and dry.     Comments: Noted sacral decubitus per EMR  Neurological:     Mental Status: She is alert.            Vital Signs: BP 102/69 (BP Location: Right Arm)   Pulse (!) 111   Temp 98.8 F (  37.1 C) (Oral)   Resp (!) 21   Wt 47.6 kg   SpO2 95%   BMI 21.21 kg/m  SpO2: SpO2: 95 % O2 Device: O2 Device: Room Air O2 Flow Rate:    Intake/output summary:  Intake/Output Summary (Last 24 hours) at 11/12/2020 0749 Last data filed at 11/12/2020 7619 Gross per 24 hour  Intake 1384.26 ml  Output 2150 ml  Net -765.74 ml   LBM: Last BM Date: 11/11/20 Baseline Weight: Weight: 41.7 kg Most recent weight: Weight: 47.6 kg       Palliative Assessment/Data: 30 %       Patient Active Problem List   Diagnosis Date Noted  . Adult failure to thrive   . Pressure injury of skin 10/18/2020  . Multiple sclerosis ([place]) 05/06/2020  . DNR (do not resuscitate) discussion 03/08/2018  . Atelectasis   . 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 Hypothyroidism 04/09/2017  . Hypocalcemia 04/08/2017  . Anemia 04/08/2017  . Osteomyelitis ([place]) 03/19/2017  . Acute osteomyelitis of left pelvic region and thigh ([place])   . Paraplegia ([place]) 03/09/2017  . Chronic constipation 02/13/2017  . Chronic pain 12/13/2016  . Hypotension 08/31/2016  . Vitamin B 12 deficiency 07/09/2016  . Pressure ulcer, stage 4 ([place]) 07/08/2016  . Sepsis ([place]) 07/07/2016  . Unintentional weight loss 07/06/2016  . Relapsing remitting multiple sclerosis ([place]) 06/08/2016  . Neurogenic bladder 04/09/2016  . Anemia of chronic disease 04/06/2016  . Dysphagia, oral phase 03/17/2016  . Palliative care by specialist   . Decubitus ulcer of sacral region, unstageable ([place])   . Protein-calorie malnutrition, severe ([place])   . 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 Elevated liver function tests 03/02/2016    Palliative Care Assessment & Plan   Patient  Profile: 5 old female with history of severe MS with spastic paraplegia contracture, multiple decubitus ulcer over sacral area, protein calorie moderation hypothyroidism recent admission for severe hypercalcemia due to dehydration treated with calcitonin and pamidronate subsequently had hypo calcium and that was aggressively repleted and discharged to skilled nursing facility/nursing home returns to the ED with abnormal labs with low calcium. In the ED patient was asymptomatic, vitals stable labs showed calcium 4.111 3.0 mag 1.1 phosphorus 8.3 ionized calcium 0.38 given 1 g calcium calcium gluconate magnesium and admitted Patient further received IV magnesium and calcium and discussed w/ nephrologist Nursing home staff/sister reported episode of seizure on 10/3 at [facility], so neurology was consulted. EEG no acute finding, MRI abnormal with diffusion restriction-possible active demyelination differential stroke, neuro advise no further treatment given high risk of steroid, advised to rule out endocarditis with echocardiogram blood culture.    Assessment:  64 old female with history of severe MS with spastic paraplegia contracture, multiple decubitus ulcer over sacral area, protein calorie moderation hypothyroidism recent admission for severe hypercalcemia due to dehydration treated with calcitonin and pamidronate subsequently had hypo calcium and that was aggressively repleted and discharged to skilled nursing facility/nursing home returns to the ED with abnormal labs with low calcium. In the ED patient was asymptomatic, vitals stable labs showed calcium 4.111 3.0 mag 1.1 phosphorus 8.3 ionized calcium 0.38 given 1 g calcium calcium gluconate magnesium and admitted Patient further received IV magnesium and calcium and discussed w/ nephrologist Nursing home staff/sister reported episode of seizure on 10/3 at [facility], so neurology was consulted. EEG no acute finding, MRI abnormal with diffusion restriction-possible  active demyelination differential stroke, neuro advise no  further treatment given high risk of steroid, advised to rule out endocarditis with echocardiogram blood culture.    Patient has stabilized and plan is for discharge back to facility with palliative care services to follow.  Recommendations/Plan: DNR/DNI No artificial feeding now or in the future MOST and GOC /Vynca form completed   Prognosis:  Unable to determine  Discharge Planning: [place] for rehab with Palliative care service follow-up  Care plan was discussed with Crystal Smith/patient, sister and Dr. Lupita Leash  Thank you for allowing the Palliative Medicine Team to assist in the care of this patient.   Time In: 1400 Time Out: 1515 Total Time 75 minutes Prolonged Time Billed  yes       Greater than 50%  of this time was spent counseling and coordinating care related to the above assessment and plan.  Wadie Lessen, NP  Please contact Palliative Medicine Team phone at [phone] for questions and concerns.

## 2020-11-12 NOTE — Progress Notes (Signed)
[facility] ([facility])  Hospital Liaison RN note         Notified by TOC manager of patient/family request for [facility] Palliative services at home after discharge.              [facility] Palliative team will follow up with patient after discharge.         Please call with any hospice or palliative related questions.         Thank you for the opportunity to participate in this patient's care.     Chrislyn King, BSN, RN [facility] Hospital Liaison (listed on [facility] under Hospice/[facility])    [phone] (24h on call)    

## 2020-11-12 NOTE — TOC Transition Note (Addendum)
Transition of Care [facility]) - CM/SW Discharge Note   Patient Details  Name: Deanna Baldwin MRN: [id number] Date of Birth: Jul 08, 1975  [facility] CM/SW Contact:  Milas Gain, [place] Phone Number: 11/12/2020, 1:51 PM   Clinical Narrative:     Patient will DC to: [place]  Anticipated DC date: 11/12/2020  Family notified: Crystal and LVM for [employer] by: [facility]-scheduled for pick up at 5:30pm  ?  Per MD patient ready for DC to [facility] with palliative services to follow. RN, patient, patient's family, Chrislynn with [facility], and facility notified of DC. Discharge Summary sent to facility. RN given number for report tele# [phone] ask for 2nd floor nurses station RM# [id number]. DC packet on chart. DNR signed by MD attached to patients DC packet. [facility] transport requested for patient.  CSW signing off.   Final next level of care: Skilled Nursing Facility Barriers to Discharge: No Barriers Identified   Patient Goals and CMS Choice Patient states their goals for this hospitalization and ongoing recovery are:: SNF CMS Medicare.gov Compare Post Acute Care list provided to:: Patient Choice offered to / list presented to : Patient  Discharge Placement              Patient chooses bed at:  [facility]) Patient to be transferred to facility by: [facility] Name of family member notified: Crystal Patient and family notified of of transfer: 11/12/20  Discharge Plan and Services                                     Social Determinants of Health (SDOH) Interventions     Readmission Risk Interventions No flowsheet data found.

## 2020-11-12 NOTE — Discharge Summary (Signed)
Physician Discharge Summary  Deanna Baldwin TDD:[id number] DOB: 1975-11-21 DOA: 11/06/2020  PCP: [name], MD  Admit date: 11/06/2020 Discharge date: 11/12/2020  Admitted From: [name] Disposition:  same  Recommendations for Outpatient Follow-up:  Follow up with PCP in 1-2 weeks Please obtain BMP. Mag in one week   Home Health:no  Equipment/Devices: none  Discharge Condition: Stable Code Status:   Code Status: DNR Diet recommendation:  Diet Order             Diet regular Room service appropriate? Yes; Fluid consistency: Thin  Diet effective now                    Brief/Interim Summary: 44 old female with history of severe MS with spastic paraplegia contracture, multiple decubitus ulcer over sacral area, protein calorie moderation hypothyroidism recent admission for severe hypercalcemia due to dehydration treated with calcitonin and pamidronate subsequently had hypo calcium and that was aggressively repleted and discharged to skilled nursing facility/nursing home returns to the ED with abnormal labs with low calcium. In the ED patient was asymptomatic, vitals stable labs showed calcium 4.111 3.0 mag 1.1 phosphorus 8.3 ionized calcium 0.38 given 1 g calcium calcium gluconate magnesium and admitted Patient further received IV magnesium and calcium and discussed w/ nephrologist. Patient's calcium and magnesium has subsequently stabilized. Nursing home staff/sister reported episode of seizure on 10/3 at [facility], so neurology was consulted. EEG no acute finding, MRI abnormal with diffusion restriction-possible active demyelination differential stroke, neuro advise no further treatment given high risk of steroid, advised to rule out endocarditis with echocardiogram blood culture.  Palliative care was consulted patient was changed to limited code no CPR okay for intubation. Patient has been needing any IV replacement in the last 2 days or so calcium magnesium remained stable  on oral supplementation.  She is tolerating diet hemodynamically stable for discharge back to facility.  Discharge Diagnoses:  Hypocalcemia:Multifactorial suspect due to recent use of pamidronate for hypercalcemia. So far IV calcium gluconate 3 g since admission.  Calcium is now stabilized this morning above 9, she will continue on oral  calcitriol bid, Tums 3 tablets 4 times daily at nursing home, check BMP in 5 to 7 days at the facility if calcium starts to go high again can try to slowly back down on the supplement . I had discussed with nephrology [name] previously.   Hypomagnesemia: Continue on mag oc 800 mg bid - Follow-up in 1 week    Mild metabolic acidosis resolved.     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 Neruo advised to rule out bacterial infection with TTE and blood culture as diffusion restriction could be due to stroke embolic-from infection but no evidence of endocarditis on echocardiogram blood culture negative so far.   Episode of seizure at [name] on 11/03/20-informed by facility nursing staff/sister  on 10/7-possible provoked seizure at risk of developing epilepsy.  Seen by neurology plan is to continue on Keppra 500 mg BID on long-term and outpatient follow-up.  Monitor electrolytes as outpatient   Decubitus ulcer of sacral region, unstageable POA continue routine wound care offloading and air mattress bed   Severe protein calorie malnutrition: Patient reports she has "high metabolism" can  she can eat orally and not gain weight.  Continue to augment diet as per dietitian.   Chronic constipation  supportive care laxatives as needed.  Refused MiraLAX yesterday. Anemia of chronic disease Hypotension on midodrine.  Systolic blood pressure at times ranges from 80s to 90s, also gets tachycardic which is -not new for her.  This drives her  MEWS to red.Patient is largely asymptomatic.    Hypothyroidism continue Synthroid.   Goals of care palliative care consulted overall prognosis guarded.  After multiple family meeting at this time she is DNR MOST form completed  Consults: pmt  Subjective: [name], cheerful and pleasant  Discharge Exam: Vitals:   11/12/20 0607 11/12/20 0757  BP: 102/69 115/80  Pulse: (!) 111 (!) 110  Resp: (!) 21 17  Temp: 98.8 F (37.1 C) 98.5 F (36.9 C)  SpO2: 95% 95%   General: Pt is alert, awake, not in acute distress Cardiovascular: RRR, S1/S2 +, no rubs, no gallops Respiratory: CTA bilaterally, no wheezing, no rhonchi Abdominal: Soft, NT, ND, bowel sounds +, urostomy bag  on RLQ Extremities: no edema, LE contractures, no cyanosis  Discharge Instructions  Discharge Instructions     Discharge instructions   Complete by: As directed    Check magnesium level and BMP in 5 to 7 days to monitor  Please call call MD or return to ER for similar or worsening recurring problem that brought you to hospital or if any fever,nausea/vomiting,abdominal pain, uncontrolled pain, chest pain,  shortness of breath or any other alarming symptoms.  Please follow-up your doctor as instructed in a week time and call the office for appointment.  Please avoid alcohol, smoking, or any other illicit substance and maintain healthy habits including taking your regular medications as prescribed.  You were cared for by a hospitalist during your hospital stay. If you have any questions about your discharge medications or the care you received while you were in the hospital after you are discharged, you can call the unit and ask to speak with the hospitalist on call if the  hospitalist that took care of you is not available.  Once you are discharged, your primary care physician will handle any further medical issues. Please note that NO REFILLS for any discharge medications will be authorized once you are discharged, as it is imperative that you return to your primary care physician (or establish a relationship with a primary care physician if you do not have one) for your aftercare needs so that they can reassess your need for medications and monitor your lab values   Discharge wound care:   Complete by: As directed    Right trochanter wound, clean with NS, pat dry then place a small piece of non-adherent gauze over the wound and secure with Medipore tape. Change daily    Continue sacral foam dressing to the coccyx area.  Place small foam dressings over bony prominences and heels. Peel down all sites with a foam dressing EACH shift.  Record your observations.  Change foam dressing every 3 days or PRN soiling.   Comments: Place a hydrocolloid dressing over the left lower buttock wound Kellie Simmering # [phone]) change every 3-4 days or PRN soiling or peeling off.   Increase activity slowly   Complete by: As directed       Allergies as of 11/12/2020       Reactions   Natalizumab Other (See Comments)   Allergy not noted on [facility]        Medication List  STOP taking these medications    Calcium 600-D 600-400 MG-UNIT Tabs Generic drug: Calcium Carbonate-Vitamin D3       TAKE these medications    acetaminophen 325 MG tablet Commonly known as: TYLENOL Take 2 tablets (650 mg total) by mouth every 6 (six) hours as needed for mild pain (or Fever >/= 101).   ascorbic acid 500 MG tablet Commonly known as: VITAMIN C Take 500 mg by mouth in the morning.   baclofen 10 MG tablet Commonly known as: LIORESAL Take 10 mg by mouth 3 (three) times daily.   bisacodyl 5 MG EC tablet Commonly known as: DULCOLAX Take 5 mg by mouth daily.   calcitRIOL 0.25 MCG  capsule Commonly known as: ROCALTROL Take 1 capsule (0.25 mcg total) by mouth 2 (two) times daily.   calcium carbonate 500 MG chewable tablet Commonly known as: TUMS - dosed in mg elemental calcium Chew 3 tablets (600 mg of elemental calcium total) by mouth 4 (four) times daily -  with meals and at bedtime.   Ensure Take 120 mLs by mouth 3 (three) times daily between meals. With meals. Vanilla What changed: Another medication with the same name was removed. Continue taking this medication, and follow the directions you see here.   ferrous sulfate 325 (65 FE) MG tablet Take 325 mg by mouth daily with breakfast.   ibuprofen 200 MG tablet Commonly known as: ADVIL Take 200 mg by mouth every 12 (twelve) hours as needed (for pain).   Keppra 500 MG tablet Generic drug: levETIRAcetam Take 500 mg by mouth in the morning and at bedtime.   levothyroxine 100 MCG tablet Commonly known as: SYNTHROID Take 100 mcg by mouth daily before breakfast.   magnesium hydroxide 400 MG/5ML suspension Commonly known as: MILK OF MAGNESIA Take 30 mLs by mouth every 12 (twelve) hours as needed for mild constipation.   magnesium oxide 400 (240 Mg) MG tablet Commonly known as: MAG-OX Take 2 tablets (800 mg total) by mouth 2 (two) times daily.   midodrine 2.5 MG tablet Commonly known as: PROAMATINE Take 1 tablet (2.5 mg total) by mouth 3 (three) times daily with meals.   multivitamin tablet Take 1 tablet by mouth daily.   NON FORMULARY Take 120 mLs by mouth See admin instructions. MedPass shake- Drink 120 ml's by mouth three times a day   omeprazole 20 MG capsule Commonly known as: PRILOSEC Take 20 mg by mouth in the morning.   ondansetron 4 MG tablet Commonly known as: ZOFRAN Take 4 mg by mouth every 6 (six) hours as needed for nausea or vomiting.   polyethylene glycol 17 g packet Commonly known as: MIRALAX / GLYCOLAX Take 17 g by mouth See admin instructions. Mix 17 grams of powder into 4-8  ounces of water and drink every morning   PROBIOTIC PO Take 1 tablet by mouth daily.   PROTEIN PO Take 30 mLs by mouth 2 (two) times daily.   Santyl ointment Generic drug: collagenase Apply 1 application topically See admin instructions. Apply to right heel topically every day shift- clean with Dakin's, apply Santyl, and cover with normal saline gauze with foam dressing   tiZANidine 2 MG tablet Commonly known as: ZANAFLEX Take 2 mg by mouth 3 (three) times daily.   VITAMIN B-12 PO Take 500 mcg by mouth daily.               Discharge Care Instructions  (From admission, onward)  Start     Ordered   11/12/20 0000  Discharge wound care:       Comments: Right trochanter wound, clean with NS, pat dry then place a small piece of non-adherent gauze over the wound and secure with Medipore tape. Change daily    Continue sacral foam dressing to the coccyx area.  Place small foam dressings over bony prominences and heels. Peel down all sites with a foam dressing EACH shift.  Record your observations.  Change foam dressing every 3 days or PRN soiling.   Comments: Place a hydrocolloid dressing over the left lower buttock wound Kellie Simmering # [phone]) change every 3-4 days or PRN soiling or peeling off.   11/12/20 0927            Follow-up Information     [name], MD Follow up.   Specialty: Internal Medicine Why: Check BMP and magnesium level in 5 to 7 days Contact information: [place] [address] [phone]                Allergies  Allergen Reactions   Natalizumab Other (See Comments)    Allergy not noted on [facility]    The results of significant diagnostics from this hospitalization (including imaging, microbiology, ancillary and laboratory) are listed below for reference.    Microbiology: Recent Results (from the past 240 hour(s))  Resp Panel by RT-PCR (Flu A&B, Covid) Nasopharyngeal Swab     Status: None   Collection  Time: 11/07/20  1:27 AM   Specimen: Nasopharyngeal Swab; Nasopharyngeal(NP) swabs in vial transport medium  Result Value Ref Range Status   SARS Coronavirus 2 by RT PCR NEGATIVE NEGATIVE Final    Comment: (NOTE) SARS-CoV-2 target nucleic acids are NOT DETECTED.  The SARS-CoV-2 RNA is generally detectable in upper respiratory specimens during the acute phase of infection. The lowest concentration of SARS-CoV-2 viral copies this assay can detect is 138 copies/mL. A negative result does not preclude SARS-Cov-2 infection and should not be used as the sole basis for treatment or other patient management decisions. A negative result may occur with  improper specimen collection/handling, submission of specimen other than nasopharyngeal swab, presence of viral mutation(s) within the areas targeted by this assay, and inadequate number of viral copies(<138 copies/mL). A negative result must be combined with clinical observations, patient history, and epidemiological information. The expected result is Negative.  Fact Sheet for Patients:  EntrepreneurPulse.com.au  Fact Sheet for Healthcare Providers:  IncredibleEmployment.be  This test is no t yet approved or cleared by the [place] FDA and  has been authorized for detection and/or diagnosis of SARS-CoV-2 by FDA under an Emergency Use Authorization (EUA). This EUA will remain  in effect (meaning this test can be used) for the duration of the COVID-19 declaration under Section 564(b)(1) of the Act, 21 U.S.C.section 360bbb-3(b)(1), unless the authorization is terminated  or revoked sooner.       Influenza A by PCR NEGATIVE NEGATIVE Final   Influenza B by PCR NEGATIVE NEGATIVE Final    Comment: (NOTE) The Xpert Xpress SARS-CoV-2/FLU/RSV plus assay is intended as an aid in the diagnosis of influenza from Nasopharyngeal swab specimens and should not be used as a sole basis for treatment. Nasal washings  and aspirates are unacceptable for Xpert Xpress SARS-CoV-2/FLU/RSV testing.  Fact Sheet for Patients: EntrepreneurPulse.com.au  Fact Sheet for Healthcare Providers: IncredibleEmployment.be  This test is not yet approved or cleared by the [place] FDA and has been authorized for detection  and/or diagnosis of SARS-CoV-2 by FDA under an Emergency Use Authorization (EUA). This EUA will remain in effect (meaning this test can be used) for the duration of the COVID-19 declaration under Section 564(b)(1) of the Act, 21 U.S.C. section 360bbb-3(b)(1), unless the authorization is terminated or revoked.  Performed at [facility], [place] [address], [place], [place] [address]   Culture, blood (routine x 2)     Status: None (Preliminary result)   Collection Time: 11/09/20  7:00 PM   Specimen: BLOOD RIGHT HAND  Result Value Ref Range Status   Specimen Description BLOOD RIGHT HAND  Final   Special Requests IN PEDIATRIC BOTTLE Blood Culture adequate volume  Final   Culture   Final    NO GROWTH 3 DAYS Performed at [facility], [place] [address], [place], [place] [address]    Report Status PENDING  Incomplete  Culture, blood (routine x 2)     Status: None (Preliminary result)   Collection Time: 11/09/20  7:01 PM   Specimen: BLOOD  Result Value Ref Range Status   Specimen Description BLOOD RIGHT ANTECUBITAL  Final   Special Requests IN PEDIATRIC BOTTLE Blood Culture adequate volume  Final   Culture   Final    NO GROWTH 3 DAYS Performed at [facility], [place] [address], [place], [place] [address]    Report Status PENDING  Incomplete  SARS CORONAVIRUS 2 (TAT 6-24 HRS) Nasopharyngeal Nasopharyngeal Swab     Status: None   Collection Time: 11/11/20 10:36 AM   Specimen: Nasopharyngeal Swab  Result Value Ref Range Status   SARS Coronavirus 2 NEGATIVE NEGATIVE Final    Comment: (NOTE) SARS-CoV-2 target nucleic acids are NOT DETECTED.  The  SARS-CoV-2 RNA is generally detectable in upper and lower respiratory specimens during the acute phase of infection. Negative results do not preclude SARS-CoV-2 infection, do not rule out co-infections with other pathogens, and should not be used as the sole basis for treatment or other patient management decisions. Negative results must be combined with clinical observations, patient history, and epidemiological information. The expected result is Negative.  Fact Sheet for Patients: SugarRoll.be  Fact Sheet for Healthcare Providers: https://www.woods-mathews.com/  This test is not yet approved or cleared by the [place] FDA and  has been authorized for detection and/or diagnosis of SARS-CoV-2 by FDA under an Emergency Use Authorization (EUA). This EUA will remain  in effect (meaning this test can be used) for the duration of the COVID-19 declaration under Se ction 564(b)(1) of the Act, 21 U.S.C. section 360bbb-3(b)(1), unless the authorization is terminated or revoked sooner.  Performed at [facility], [place] [address], [place],  [address]     Procedures/Studies: MR BRAIN W WO CONTRAST  Result Date: 11/09/2020 CLINICAL DATA:  45 year old female with chronic multiple sclerosis. New symptoms. EXAM: MRI HEAD WITHOUT AND WITH CONTRAST TECHNIQUE: Multiplanar, multiecho pulse sequences of the brain and surrounding structures were obtained without and with intravenous contrast. CONTRAST:  4.30mL GADAVIST GADOBUTROL 1 MMOL/ML IV SOLN COMPARISON:  Report of brain MRI 09/16/2002 (no images available). FINDINGS: Brain: Advanced brain volume loss for age. Extensive bilateral abnormal cerebral white matter signal and volume loss. Associated severe atrophy of the corpus callosum. Scattered subcortical white matter involvement, most pronounced in the right parietal lobe. And associated cortical involvement also most pronounced in the posterior  right superior frontal and parietal lobes. Comparatively mild associated heterogeneity in the deep gray matter nuclei. But there is Wallerian  degeneration in the right brainstem, and moderate heterogeneity in the pons and cerebellar peduncles. Cerebellum otherwise relatively spared. Mildly heterogeneous cervicomedullary junction. On DWI there is a punctate focus of restricted diffusion in the left superior frontal gyrus subcortical white matter (series 2, image 42). And there also appears to be a small area of cortical restricted diffusion along the right parieto-occipital lobe (series 2, image 35). But no other definite diffusion restriction. Postcontrast images are mildly motion degraded. No abnormal enhancement identified. No superimposed restricted diffusion suggestive of acute infarction. No midline shift, mass effect, evidence of mass lesion, ventriculomegaly, extra-axial collection or acute intracranial hemorrhage. Pituitary within normal limits. No chronic cerebral blood products identified. Vascular: Major intracranial vascular flow voids are preserved. Skull and upper cervical spine: Grossly negative visible cervical spine. Visualized bone marrow signal is within normal limits. Sinuses/Orbits: Symmetric orbits.  Paranasal sinuses are clear. Other: Trace left mastoid air cell fluid. Negative visible nasopharynx, right mastoids and other visible internal auditory structures. IMPRESSION: 1. Chronic severe demyelinating disease. Two superimposed small areas of diffusion restriction suspicious for active demyelination in the inferior right parietal lobe and left superior frontal gyrus (punctate), although no definite enhancement. 2. No other acute intracranial abnormality. Electronically Signed   By: Genevie Ann M.D.   On: 11/09/2020 11:20   US RENAL  Result Date: 10/14/2020 CLINICAL DATA:  Acute kidney injury EXAM: RENAL / URINARY TRACT ULTRASOUND COMPLETE COMPARISON:  None. FINDINGS: Right Kidney: Renal  measurements: 11.4 x 5.2 x 5.0 cm = volume: 153 mL. Increased parenchymal echogenicity. 9 mm lower pole calculus. Moderate hydronephrosis. Left Kidney: Renal measurements: 10.0 x 4.8 x 2.9 cm = volume: 74 mL. Poorly visualized. Increased parenchymal echogenicity. Moderate hydronephrosis. Bladder: Not visualized due to colostomy. Other: None. IMPRESSION: Moderate bilateral hydronephrosis. 9 mm right lower pole renal calculus. Increased parenchymal echogenicity, suggesting medical renal disease, with suspected left renal atrophy (although poorly visualized. Bladder not discretely visualized. Electronically Signed   By: Julian Hy M.D.   On: 10/14/2020 01:13   CT CHEST ABDOMEN PELVIS W CONTRAST  Result Date: 10/18/2020 CLINICAL DATA:  Sepsis EXAM: CT CHEST, ABDOMEN, AND PELVIS WITH CONTRAST TECHNIQUE: Multidetector CT imaging of the chest, abdomen and pelvis was performed following the standard protocol during bolus administration of intravenous contrast. CONTRAST:  80mL OMNIPAQUE IOHEXOL 300 MG/ML SOLN, additional oral enteric contrast COMPARISON:  CT chest, 08/02/2017 CT abdomen pelvis, 06/25/2008 FINDINGS: CT CHEST FINDINGS Cardiovascular: No significant vascular findings. Normal heart size. No pericardial effusion. Mediastinum/Nodes: No enlarged mediastinal, hilar, or axillary lymph nodes. Thyroid gland, trachea, and esophagus demonstrate no significant findings. Lungs/Pleura: Small bilateral pleural effusions. Dense fibrotic scarring, bronchiectasis, and or consolidation of the dependent left lower lobe (series 5, image 104). Musculoskeletal: No chest wall mass or suspicious bone lesions identified. CT ABDOMEN PELVIS FINDINGS Hepatobiliary: No solid liver abnormality is seen. No gallstones, gallbladder wall thickening, or biliary dilatation. Pancreas: Unremarkable. No pancreatic ductal dilatation or surrounding inflammatory changes. Spleen: Normal in size without significant abnormality.  Adrenals/Urinary Tract: Adrenal glands are unremarkable. Mild left, moderate right hydronephrosis and hydroureter, the distal ureters very poorly visualized but without obvious obstructing calculi or other lesion. Stomach/Bowel: Stomach is within normal limits. Appendix appears normal. No evidence of bowel wall thickening, distention, or inflammatory changes. Vascular/Lymphatic: No significant vascular findings are present. No enlarged abdominal or pelvic lymph nodes. Reproductive: No mass or other abnormality. Other: No abdominal wall hernia or abnormality. No abdominopelvic ascites. Musculoskeletal: Decubitus wounds of the sacrum (series 3, image 95),  bilateral ischial rami (series 3, image 121) and overlying the left greater trochanter (series 3, image 122). There is underlying sclerosis without bony erosion. IMPRESSION: 1. Small bilateral pleural effusions. 2. Dense fibrotic scarring, bronchiectasis, and or consolidation of the dependent left lower lobe. Findings are consistent with acute and/or chronic sequelae of infection or aspiration. 3. Mild left, moderate right hydronephrosis and hydroureter, the distal ureters very poorly visualized but without obvious obstructing calculi or other lesion. 4. Decubitus wounds of the sacrum, bilateral ischial rami and overlying the left greater trochanter. There is underlying sclerosis without acute appearing bony erosion. MRI is the test of choice to assess for osteomyelitis if clinically suspected. Electronically Signed   By: Eddie Candle M.D.   On: 10/18/2020 15:06   DG CHEST PORT 1 VIEW  Result Date: 10/17/2020 CLINICAL DATA:  Fever. EXAM: PORTABLE CHEST 1 VIEW COMPARISON:  Chest x-ray 08/10/2017 CT chest 729 FINDINGS: The heart and mediastinal contours are within normal limits. No focal consolidation. No pulmonary edema. Query trace left pleural effusion. No pneumothorax. No acute osseous abnormality. IMPRESSION: Query trace left pleural effusion. Electronically  Signed   By: Iven Finn M.D.   On: 10/17/2020 22:19   EEG adult  Result Date: 11/07/2020 Lora Havens, MD     11/07/2020  7:44 PM Patient Name: Deanna Baldwin MRN: [id number] Epilepsy Attending: Lora Havens Referring Physician/Provider: Anibal Henderson, NP Date: 11/07/2020 Duration: 22.48 mins Patient history: 45 y.o. female who presented to the ED 10/6 due to lab abnormalities identified at patient's facility with reports of seizure-like activity 10/3. Unable to obtain further collateral information other than 1 mg IM lorazepam administered for seizure-like activity lasting < 5 minutes. EEG to evaluate for seizure Level of alertness: Awake AEDs during EEG study: LEV Technical aspects: This EEG study was done with scalp electrodes positioned according to the 10-20 International system of electrode placement. Electrical activity was acquired at a sampling rate of 500Hz  and reviewed with a high frequency filter of 70Hz  and a low frequency filter of 1Hz . EEG data were recorded continuously and digitally stored. Description: The posterior dominant rhythm consists of 8Hz  activity of moderate voltage (25-35 uV) seen predominantly in posterior head regions, symmetric and reactive to eye opening and eye closing. Hyperventilation and photic stimulation were not performed.   IMPRESSION: This study is within normal limits. No seizures or epileptiform discharges were seen throughout the recording. Lora Havens   ECHOCARDIOGRAM COMPLETE  Result Date: 11/10/2020    ECHOCARDIOGRAM REPORT   Patient Name:   MASHELLE BUSICK Date of Exam: 11/10/2020 Medical Rec #:  [id number]     Height:       59.0 in Accession #:    [id number]    Weight:       92.0 lb Date of Birth:  [birth year]     BSA:          1.326 m Patient Age:    42 years      BP:           93/62 mmHg Patient Gender: F             HR:           115 bpm. Exam Location:  Inpatient Procedure: 2D Echo, Cardiac Doppler and Color Doppler Indications:    CVA   History:        Patient has prior history of Echocardiogram examinations, most  recent 08/03/2016. Stroke; Signs/Symptoms:Hypotension. Severe                 multiple sclerosis, paraplegia.  Sonographer:    Dustin Flock RDCS Referring Phys: [id number] [place]  1. Left ventricular ejection fraction, by estimation, is 65 to 70%. The left ventricle has normal function. The left ventricle has no regional wall motion abnormalities. Left ventricular diastolic parameters were normal.  2. Right ventricular systolic function is normal. The right ventricular size is normal. There is normal pulmonary artery systolic pressure.  3. The mitral valve is abnormal. Trivial mitral valve regurgitation. No evidence of mitral stenosis.  4. The aortic valve is tricuspid. Aortic valve regurgitation is not visualized. No aortic stenosis is present.  5. The inferior vena cava is normal in size with greater than 50% respiratory variability, suggesting right atrial pressure of 3 mmHg. Comparison(s): No significant change from prior study. Conclusion(s)/Recommendation(s): Normal biventricular function without evidence of hemodynamically significant valvular heart disease. No intracardiac source of embolism detected on this transthoracic study. A transesophageal echocardiogram is recommended to exclude cardiac source of embolism if clinically indicated. FINDINGS  Left Ventricle: Left ventricular ejection fraction, by estimation, is 65 to 70%. The left ventricle has normal function. The left ventricle has no regional wall motion abnormalities. The left ventricular internal cavity size was normal in size. There is  no left ventricular hypertrophy. Left ventricular diastolic parameters were normal. Right Ventricle: The right ventricular size is normal. No increase in right ventricular wall thickness. Right ventricular systolic function is normal. There is normal pulmonary artery systolic pressure. The tricuspid  regurgitant velocity is 2.26 m/s, and  with an assumed right atrial pressure of 3 mmHg, the estimated right ventricular systolic pressure is 22.2 mmHg. Left Atrium: Left atrial size was normal in size. Right Atrium: Right atrial size was normal in size. Pericardium: There is no evidence of pericardial effusion. Mitral Valve: MIld mitral valve bowing without prolapse. Independently mobile linear structure in subvalvular apparatus, suspect chordae. The mitral valve is abnormal. Trivial mitral valve regurgitation. No evidence of mitral valve stenosis. Tricuspid Valve: The tricuspid valve is normal in structure. Tricuspid valve regurgitation is trivial. No evidence of tricuspid stenosis. Aortic Valve: The aortic valve is tricuspid. Aortic valve regurgitation is not visualized. No aortic stenosis is present. Pulmonic Valve: The pulmonic valve was not well visualized. Pulmonic valve regurgitation is not visualized. No evidence of pulmonic stenosis. Aorta: The aortic root, ascending aorta, aortic arch and descending aorta are all structurally normal, with no evidence of dilitation or obstruction. Venous: The inferior vena cava is normal in size with greater than 50% respiratory variability, suggesting right atrial pressure of 3 mmHg. IAS/Shunts: No atrial level shunt detected by color flow Doppler.  LEFT VENTRICLE PLAX 2D LVIDd:         4.30 cm   Diastology LVIDs:         2.70 cm   LV e' medial:    9.68 cm/s LV PW:         0.90 cm   LV E/e' medial:  7.6 LV IVS:        0.90 cm   LV e' lateral:   8.70 cm/s LVOT diam:     2.10 cm   LV E/e' lateral: 8.4 LV SV:         67 LV SV Index:   50 LVOT Area:     3.46 cm  RIGHT VENTRICLE RV Basal diam:  2.20 cm RV S prime:  11.50 cm/s TAPSE (M-mode): 1.9 cm LEFT ATRIUM             Index        RIGHT ATRIUM          Index LA diam:        2.30 cm 1.73 cm/m   RA Area:     9.29 cm LA Vol (A2C):   24.3 ml 18.33 ml/m  RA Volume:   16.60 ml 12.52 ml/m LA Vol (A4C):   28.6 ml 21.57  ml/m LA Biplane Vol: 28.2 ml 21.27 ml/m  AORTIC VALVE LVOT Vmax:   105.00 cm/s LVOT Vmean:  76.600 cm/s LVOT VTI:    0.193 m  AORTA Ao Root diam: 2.80 cm MITRAL VALVE               TRICUSPID VALVE MV Area (PHT): 7.09 cm    TR Peak grad:   20.4 mmHg MV Decel Time: 107 msec    TR Vmax:        226.00 cm/s MV E velocity: 73.20 cm/s MV A velocity: 99.40 cm/s  SHUNTS MV E/A ratio:  0.74        Systemic VTI:  0.19 m                            Systemic Diam: 2.10 cm Buford Dresser MD Electronically signed by Buford Dresser MD Signature Date/Time: 11/10/2020/3:55:31 PM    Final     Labs: BNP (last 3 results) No results for input(s): BNP in the last 8760 hours. Basic Metabolic Panel: Recent Labs  Lab 11/08/20 0226 11/08/20 1629 11/09/20 0458 11/09/20 1707 11/10/20 0550 11/11/20 0046 11/12/20 0232  NA 140 143 144 141 140 141 140  K 4.1 4.5 5.0 5.0 4.8 4.5 4.6  CL 105 108 105 102 102 102 101  CO2 23 24 27 26 29 30 29   GLUCOSE 144* 119* 98 110* 99 118* 126*  BUN 15 15 17 17  23* 21* 24*  CREATININE 0.69 0.57 0.57 0.66 0.70 0.70 0.79  CALCIUM 6.9* 6.5* 7.9* 7.2* 6.8* 7.7* 9.1  MG 1.9 1.5* 2.0 1.6* 1.4* 2.5* 1.7  PHOS 5.5* 5.5* 6.0* 5.1* 4.7*  --   --    Liver Function Tests: Recent Labs  Lab 11/06/20 1707 11/07/20 0500 11/08/20 0226 11/09/20 0458 11/10/20 0550  AST 19 17 13* 16 17  ALT 10 11 10 9 12   ALKPHOS 142* 135* 116 106 94  BILITOT 0.2* 0.5 0.2* 0.2* 0.5  PROT 7.8 7.2 6.5 6.7 6.6  ALBUMIN 3.0* 2.8* 2.3* 2.5* 2.3*   No results for input(s): LIPASE, AMYLASE in the last 168 hours. No results for input(s): AMMONIA in the last 168 hours. CBC: Recent Labs  Lab 11/06/20 1700 11/06/20 1707  WBC  --  9.5  NEUTROABS  --  7.6  HGB 12.9 10.3*  HCT 38.0 33.6*  MCV  --  94.1  PLT  --  653*   Cardiac Enzymes: No results for input(s): CKTOTAL, CKMB, CKMBINDEX, TROPONINI in the last 168 hours. BNP: Invalid input(s): POCBNP CBG: No results for input(s): GLUCAP in the  last 168 hours. D-Dimer No results for input(s): DDIMER in the last 72 hours. Hgb A1c No results for input(s): HGBA1C in the last 72 hours. Lipid Profile No results for input(s): CHOL, HDL, LDLCALC, TRIG, CHOLHDL, LDLDIRECT in the last 72 hours. Thyroid function studies No results for input(s): TSH, T4TOTAL, T3FREE, THYROIDAB in the  last 72 hours.  Invalid input(s): FREET3 Anemia work up No results for input(s): VITAMINB12, FOLATE, FERRITIN, TIBC, IRON, RETICCTPCT in the last 72 hours. Urinalysis    Component Value Date/Time   COLORURINE YELLOW 10/17/2020 2148   APPEARANCEUR HAZY (A) 10/17/2020 2148   LABSPEC 1.008 10/17/2020 2148   PHURINE 7.0 10/17/2020 2148   GLUCOSEU NEGATIVE 10/17/2020 2148   HGBUR SMALL (A) 10/17/2020 2148   BILIRUBINUR NEGATIVE 10/17/2020 2148   KETONESUR 5 (A) 10/17/2020 2148   PROTEINUR NEGATIVE 10/17/2020 2148   UROBILINOGEN 0.2 06/25/2008 1551   NITRITE POSITIVE (A) 10/17/2020 2148   LEUKOCYTESUR SMALL (A) 10/17/2020 2148   Sepsis Labs Invalid input(s): PROCALCITONIN,  WBC,  LACTICIDVEN Microbiology Recent Results (from the past 240 hour(s))  Resp Panel by RT-PCR (Flu A&B, Covid) Nasopharyngeal Swab     Status: None   Collection Time: 11/07/20  1:27 AM   Specimen: Nasopharyngeal Swab; Nasopharyngeal(NP) swabs in vial transport medium  Result Value Ref Range Status   SARS Coronavirus 2 by RT PCR NEGATIVE NEGATIVE Final    Comment: (NOTE) SARS-CoV-2 target nucleic acids are NOT DETECTED.  The SARS-CoV-2 RNA is generally detectable in upper respiratory specimens during the acute phase of infection. The lowest concentration of SARS-CoV-2 viral copies this assay can detect is 138 copies/mL. A negative result does not preclude SARS-Cov-2 infection and should not be used as the sole basis for treatment or other patient management decisions. A negative result may occur with  improper specimen collection/handling, submission of specimen other than  nasopharyngeal swab, presence of viral mutation(s) within the areas targeted by this assay, and inadequate number of viral copies(<138 copies/mL). A negative result must be combined with clinical observations, patient history, and epidemiological information. The expected result is Negative.  Fact Sheet for Patients:  EntrepreneurPulse.com.au  Fact Sheet for Healthcare Providers:  IncredibleEmployment.be  This test is no t yet approved or cleared by the [place] FDA and  has been authorized for detection and/or diagnosis of SARS-CoV-2 by FDA under an Emergency Use Authorization (EUA). This EUA will remain  in effect (meaning this test can be used) for the duration of the COVID-19 declaration under Section 564(b)(1) of the Act, 21 U.S.C.section 360bbb-3(b)(1), unless the authorization is terminated  or revoked sooner.       Influenza A by PCR NEGATIVE NEGATIVE Final   Influenza B by PCR NEGATIVE NEGATIVE Final    Comment: (NOTE) The Xpert Xpress SARS-CoV-2/FLU/RSV plus assay is intended as an aid in the diagnosis of influenza from Nasopharyngeal swab specimens and should not be used as a sole basis for treatment. Nasal washings and aspirates are unacceptable for Xpert Xpress SARS-CoV-2/FLU/RSV testing.  Fact Sheet for Patients: EntrepreneurPulse.com.au  Fact Sheet for Healthcare Providers: IncredibleEmployment.be  This test is not yet approved or cleared by the [place] FDA and has been authorized for detection and/or diagnosis of SARS-CoV-2 by FDA under an Emergency Use Authorization (EUA). This EUA will remain in effect (meaning this test can be used) for the duration of the COVID-19 declaration under Section 564(b)(1) of the Act, 21 U.S.C. section 360bbb-3(b)(1), unless the authorization is terminated or revoked.  Performed at [facility], [place] [address] [place], [place] [address]    Culture, blood (routine x 2)     Status: None (Preliminary result)   Collection Time: 11/09/20  7:00 PM   Specimen: BLOOD RIGHT HAND  Result Value Ref Range Status   Specimen Description BLOOD RIGHT HAND  Final  Special Requests IN PEDIATRIC BOTTLE Blood Culture adequate volume  Final   Culture   Final    NO GROWTH 3 DAYS Performed at [facility], [place] [address], [place], [place] [address]    Report Status PENDING  Incomplete  Culture, blood (routine x 2)     Status: None (Preliminary result)   Collection Time: 11/09/20  7:01 PM   Specimen: BLOOD  Result Value Ref Range Status   Specimen Description BLOOD RIGHT ANTECUBITAL  Final   Special Requests IN PEDIATRIC BOTTLE Blood Culture adequate volume  Final   Culture   Final    NO GROWTH 3 DAYS Performed at [facility], [place] [address], [place], [place] [address]    Report Status PENDING  Incomplete  SARS CORONAVIRUS 2 (TAT 6-24 HRS) Nasopharyngeal Nasopharyngeal Swab     Status: None   Collection Time: 11/11/20 10:36 AM   Specimen: Nasopharyngeal Swab  Result Value Ref Range Status   SARS Coronavirus 2 NEGATIVE NEGATIVE Final    Comment: (NOTE) SARS-CoV-2 target nucleic acids are NOT DETECTED.  The SARS-CoV-2 RNA is generally detectable in upper and lower respiratory specimens during the acute phase of infection. Negative results do not preclude SARS-CoV-2 infection, do not rule out co-infections with other pathogens, and should not be used as the sole basis for treatment or other patient management decisions. Negative results must be combined with clinical observations, patient history, and epidemiological information. The expected result is Negative.  Fact Sheet for Patients: SugarRoll.be  Fact Sheet for Healthcare Providers: https://www.woods-mathews.com/  This test is not yet approved or cleared by the [place] FDA and  has been authorized for detection  and/or diagnosis of SARS-CoV-2 by FDA under an Emergency Use Authorization (EUA). This EUA will remain  in effect (meaning this test can be used) for the duration of the COVID-19 declaration under Se ction 564(b)(1) of the Act, 21 U.S.C. section 360bbb-3(b)(1), unless the authorization is terminated or revoked sooner.  Performed at [facility], [place] [address], [place], [place] [address]      Time coordinating discharge: 35 minutes  SIGNED: Antonieta Pert, MD  [facility] 11/12/2020, 9:28 AM  If 7PM-7AM, please contact night-coverage [email or web address]

## 2020-11-12 NOTE — Progress Notes (Signed)
Report given to [facility].

## 2020-11-12 NOTE — TOC Progression Note (Signed)
Transition of Care [facility]) - Progression Note    Patient Details  Name: Vendela Troung MRN: [id number] Date of Birth: Aug 19, 1975  Transition of [facility] CM/SW [place], [place] Phone Number: 11/12/2020, 1:44 PM  Clinical Narrative:     CSW spoke with patient at bedside. Patient confirmed plan is to return to [facility]. CSW received consult for palliative services to follow patient at [facility].Patient gave CSW permission to make palliative referral to [place]. CSW spoke with Chrislynn with [facility] and made referral for palliative services to follow patient at [facility].     Barriers to Discharge: No Barriers Identified  Expected Discharge Plan and Services           Expected Discharge Date: 11/12/20                                     Social Determinants of Health (SDOH) Interventions    Readmission Risk Interventions No flowsheet data found.

## 2020-11-14 LAB — CULTURE, BLOOD (ROUTINE X 2)
Culture: NO GROWTH
Culture: NO GROWTH
Special Requests: ADEQUATE
Special Requests: ADEQUATE

## 2022-04-12 ENCOUNTER — Non-Acute Institutional Stay: Payer: Medicare Other | Admitting: Family Medicine

## 2022-04-12 ENCOUNTER — Encounter: Payer: Self-pay | Admitting: Family Medicine

## 2022-04-12 VITALS — BP 70/40 | HR 100 | Temp 98.0°F | Resp 18

## 2022-04-12 DIAGNOSIS — E43 Unspecified severe protein-calorie malnutrition: Secondary | ICD-10-CM

## 2022-04-12 DIAGNOSIS — K5909 Other constipation: Secondary | ICD-10-CM

## 2022-04-12 NOTE — Progress Notes (Unsigned)
[place] Consult Note Telephone: [phone]  Fax: [phone]   Date of encounter: 04/12/22 11:27 AM PATIENT NAME: Deanna Baldwin [address] [place] [address]   [phone] (home) [phone] (work) DOB: 1975-12-05 MRN: [id number] PRIMARY CARE PROVIDER:    [name], MD,  [address] [place] [address] [phone]  REFERRING PROVIDER:   [name], MD [address] [place],  [place] [address] [place] Agent/Health Care Power of Attorney:    Contact Information     Name Relation Home Work Mobile   Smith,Crystal Sister   [phone]   Frederich Cha [phone]  [phone]        I met face to face with patient in [facility] facility. Palliative Care was asked to follow this patient by consultation request of Deanna Marble, MD to address advance care planning and complex medical decision making. This is a follow up visit.  ADVANCE CARE PLANNING/GOALS OF CARE:  Review of an existing advance directive document-MOST.  CODE STATUS: MOST as of 11/11/20: DNR/DNI with limited additional intervention Use of antibiotics and IV fluids if indicated No feeding tube.     ASSESSMENT AND / RECOMMENDATIONS:  PPS: 30% Severe protein calorie malnutrition Recommend continuing high protein source intake and giving medications with foods to increase caloric content. Continue Ensure and Med-Pass in most concentrated form to improve protein intake. Consider appetite stimulant, monitor weight weekly.  Chronic constipation Recommend bowel regimen of continued Dulcolex tab daily, may increase to 10 mg and 17 gm Miralax. Would recommend suppository vs soap sud enema if above ineffective Need for iron supplementation complicates constipation.    Follow up Palliative Care Visit:  Palliative Care continuing to follow up by monitoring for changes in appetite, weight,  functional and cognitive status for chronic disease progression and management in agreement with patient's stated goals of care. Next visit in 4 weeks or prn.  This visit was coded based on medical decision making (MDM).  Chief Complaint  Palliative Care is continuing to follow pt for chronic medical management of symptoms for bedbound patient with diagnosis of multiple sclerosis.  HISTORY OF PRESENT ILLNESS: Deanna Baldwin is a 47 y.o. 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 She has significant protein calorie malnutrition and muscle wasting, a neurogenic bladder with urostomy tube in place and hx of stage pressure ulcer with osteomyelitis and sepsis. She states her appetite is good and family brings her snacks to eat.  She has an adult son in college in [place].    ACTIVITIES OF DAILY LIVING: CONTINENT OF BOWEL? No Urostomy in place Independent with FEEDING BATHING/DRESSING-assistance.  MOBILITY:  BEDBOUND? Yes  APPETITE? 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   Hypothyroidism 04/09/2017   Hypocalcemia 04/08/2017   Anemia 04/08/2017   Osteomyelitis ([place]) 03/19/2017   Acute osteomyelitis of left pelvic region and thigh ([place])    Paraplegia ([place]) 03/09/2017   Chronic constipation 02/13/2017   Chronic pain 12/13/2016   Hypotension 08/31/2016   Vitamin B 12 deficiency 07/09/2016   Pressure ulcer, stage 4 ([place]) 07/08/2016  Sepsis ([place]) 07/07/2016   Unintentional weight loss 07/06/2016   Relapsing remitting multiple sclerosis ([place]) 06/08/2016   Neurogenic bladder 04/09/2016   Anemia of chronic  disease 04/06/2016   Dysphagia, oral phase 03/17/2016   Palliative care by specialist    Decubitus ulcer of sacral region, unstageable ([place])    Protein-calorie malnutrition, severe ([place])    Spastic 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 ([place]) 03/02/2016   Elevated liver function tests 03/02/2016   PAST MEDICAL HISTORY:  Active Ambulatory Problems    Diagnosis Date Noted   Spastic 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 ([place]) 03/02/2016   Elevated liver function tests 03/02/2016   Decubitus ulcer of sacral region, unstageable ([place])    Protein-calorie malnutrition, severe ([place])    Palliative care by specialist    Dysphagia, oral phase 03/17/2016   Anemia of chronic disease 04/06/2016   Neurogenic bladder 04/09/2016   Relapsing remitting multiple sclerosis ([place]) 06/08/2016   Unintentional weight loss 07/06/2016   Sepsis ([place]) 07/07/2016   Pressure ulcer, stage 4 ([place]) 07/08/2016   Vitamin B 12 deficiency 07/09/2016   Hypotension 08/31/2016   Chronic pain 12/13/2016   Chronic constipation 02/13/2017   Paraplegia ([place]) 03/09/2017   Acute osteomyelitis of left pelvic region and thigh ([place])    Osteomyelitis ([place]) 03/19/2017   Hypocalcemia 04/08/2017   Anemia 04/08/2017   Hypothyroidism 04/09/2017   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[place]) 05/23/2017   Atelectasis    DNR (do not resuscitate) discussion 03/08/2018   Multiple sclerosis ([place]) 05/06/2020   Pressure injury of skin 10/18/2020   Adult failure to thrive    Resolved Ambulatory Problems    Diagnosis Date Noted   Anosmia 02/23/2016   Severe sepsis ([place]) 03/02/2016   Community acquired pneumonia 03/02/2016   UTI (urinary tract infection) 03/02/2016   Buttock wound 03/02/2016   Protein-calorie malnutrition ([place]) 03/02/2016   Pressure ulcer, stage 3 ([place]) 03/03/2016   Hypokalemia 03/05/2016   Hypernatremia    Sepsis secondary to UTI ([place])    Aspiration pneumonia of left lower lobe due  to vomit (HCC)    Multiple sclerosis (HCC)    Muscle abscess    Myofascitis    Other neutropenia (HCC)    Neutropenia (HCC)    Iron deficiency anemia 04/08/2016   Tachycardia 04/08/2016   Citrobacter infection 04/08/2016   Foley catheter in place 04/09/2016   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   Foley catheter problem ([place])    Sepsis due to urinary tract infection ([place])    Fever, unspecified 08/31/2016   Abnormal liver function    Pressure injury of skin 08/31/2016   Leukocytosis 09/15/2016   Elevated liver enzymes 02/13/2017   SIRS (systemic inflammatory response syndrome) ([place]) 03/17/2017   Altered mental status    Dehydration    HCAP (healthcare-associated pneumonia) 08/02/2017   Hypercalcemia 10/14/2020   Past Medical History:  Diagnosis Date   Dysrhythmia    MS (multiple sclerosis) ([place])    Pneumonia 02/2016   Protein calorie malnutrition ([place])     Preferred Pharmacy: ALLERGIES:  Allergies  Allergen Reactions   Natalizumab Other (See Comments)    Allergy not noted on MAR     PERTINENT MEDICATIONS:  Outpatient Encounter Medications as of 04/12/2022  Medication Sig   acetaminophen (TYLENOL) 325 MG tablet Take 2 tablets (650 mg total) by mouth every 6 (six) hours as needed for mild pain (or Fever >/= 101).   ascorbic acid (VITAMIN C) 500 MG tablet Take 500 mg  by mouth in the morning.   baclofen (LIORESAL) 10 MG tablet Take 10 mg by mouth 3 (three) times daily.   bisacodyl (DULCOLAX) 5 MG EC tablet Take 5 mg by mouth daily.   calcitRIOL (ROCALTROL) 0.25 MCG capsule Take 1 capsule (0.25 mcg total) by mouth 2 (two) times daily.   calcium carbonate (TUMS - DOSED IN MG ELEMENTAL CALCIUM) 500 MG chewable tablet Chew 3 tablets (600 mg of elemental calcium total) by mouth 4 (four) times daily -  with meals and at bedtime.   Cyanocobalamin (VITAMIN B-12 PO) Take 500 mcg by mouth daily.   ENSURE (ENSURE) Take 120 mLs by mouth 3 (three) times daily between meals. With meals.  Vanilla   ferrous sulfate 325 (65 FE) MG tablet Take 325 mg by mouth daily with breakfast.   ibuprofen (ADVIL,MOTRIN) 200 MG tablet Take 200 mg by mouth every 12 (twelve) hours as needed (for pain).   KEPPRA 500 MG tablet Take 500 mg by mouth in the morning and at bedtime.   levothyroxine (SYNTHROID) 100 MCG tablet Take 100 mcg by mouth daily before breakfast.   magnesium hydroxide (MILK OF MAGNESIA) 400 MG/5ML suspension Take 30 mLs by mouth every 12 (twelve) hours as needed for mild constipation.   magnesium oxide (MAG-OX) 400 (240 Mg) MG tablet Take 2 tablets (800 mg total) by mouth 2 (two) times daily.   midodrine (PROAMATINE) 2.5 MG tablet Take 1 tablet (2.5 mg total) by mouth 3 (three) times daily with meals.   Multiple Vitamin (MULTIVITAMIN) tablet Take 1 tablet by mouth daily.   NON FORMULARY Take 120 mLs by mouth See admin instructions. MedPass shake- Drink 120 ml's by mouth three times a day   omeprazole (PRILOSEC) 20 MG capsule Take 20 mg by mouth in the morning.   ondansetron (ZOFRAN) 4 MG tablet Take 4 mg by mouth every 6 (six) hours as needed for nausea or vomiting.   polyethylene glycol (MIRALAX / GLYCOLAX) packet Take 17 g by mouth See admin instructions. Mix 17 grams of powder into 4-8 ounces of water and drink every morning   Probiotic Product (PROBIOTIC PO) Take 1 tablet by mouth daily.   PROTEIN PO Take 30 mLs by mouth 2 (two) times daily.   SANTYL ointment Apply 1 application topically See admin instructions. Apply to right heel topically every day shift- clean with Dakin's, apply Santyl, and cover with normal saline gauze with foam dressing   tiZANidine (ZANAFLEX) 2 MG tablet Take 2 mg by mouth 3 (three) times daily.   No facility-administered encounter medications on file as of 04/12/2022.    History obtained from review of EMR, discussion with facility staff/caregiver and/or patient.     I reviewed available labs, medications, imaging, studies and related documents  from the EMR.  There were no new records/imaging since last visit.   Physical Exam: GENERAL: NAD LUNGS: CTAB, no increased work of breathing, room air CARDIAC:  S1S2, RRR with no MRG, No edema/cyanosis ABD:  Normo-active BS x 4 quads, soft, non-tender EXTREMITIES: Normal ROM, no deformity, strength equal, Yes muscle atrophy/subcutaneous fat loss generalized NEURO:  Noted weakness of BLE, no observed cognitive impairment  PSYCH:  non-anxious affect, A & O x 3  Thank you for the opportunity to participate in the care of [facility]. Please call our main office at [phone] if we can be of additional assistance.    Damaris Hippo FNP-C  [email or web address] [facility]  Phone:  [phone]

## 2022-05-10 ENCOUNTER — Ambulatory Visit (INDEPENDENT_AMBULATORY_CARE_PROVIDER_SITE_OTHER): Payer: Medicare (Managed Care) | Admitting: Neurology

## 2022-05-10 ENCOUNTER — Encounter: Payer: Self-pay | Admitting: Neurology

## 2022-05-10 VITALS — BP 105/73 | HR 127 | Ht 59.0 in

## 2022-05-10 DIAGNOSIS — R531 Weakness: Secondary | ICD-10-CM | POA: Insufficient documentation

## 2022-05-10 DIAGNOSIS — G35 Multiple sclerosis: Secondary | ICD-10-CM | POA: Diagnosis not present

## 2022-05-10 NOTE — Progress Notes (Signed)
Chief Complaint  Patient presents with   Follow-up    Rm 14. Accompanied by caregiver. C/o left arm weakness.      ASSESSMENT AND PLAN  Deanna Baldwin is a 47 y.o. 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  Return To Clinic with new issues,  DIAGNOSTIC DATA (LABS, IMAGING, TESTING) - I reviewed patient records, labs, notes, testing and imaging myself where available.   MEDICAL HISTORY:  Deanna Baldwin is a 47 year old right-handed female,, seen in request by her primary care doctor   Deanna Baldwin, for evaluation of worsening left arm weakness, she is brought in by nursing home staff at today's visit on May 10, 2022  I reviewed and summarized the referring note.  Deanna Baldwin is well-known to our clinic, she has multiple sclerosis, severe contraction of bilateral lower extremity, last visit with our office was in September 2020  She was diagnosed with relapsing remitting multiple sclerosis since age 75, initially presented with optic neuritis, then difficulty walking, frequent flareup during the initial years, eventually wheelchair-bound since 2011  She was under the care of of [facility] neurologist Dr. Harlen Labs until 2017, attempted different immunomodulation treatment for her progressive relapsing remitting multiple sclerosis during the initial years, allergic reaction to Silvestre Moment, rituximab from 2013 1000 mg every 6 months, until 2016, due to her declining  functional status, hard to keep up with schedule, last infusion was in December 2016  By then, she has developed significant lower extremity spasticity, wheelchair-bound,  Around that time, she also had multiple hospital admission for urinary tract infection, decubitus ulcer,  Previous MRI demonstrated extensive supratentorium involvement, also has extensive cervical cord abnormality, multiple patchy area signal abnormality involving the whole cervical cord, visualized the brainstem, and upper thoracic region  For a while, I attempted to alleviate lower extremity spasticity by using electrical stimulation guided xeomin injection, was not sure about the benefit, eventually stopped around 2020,   She was switched from rituximab infusion to ocrelizumab at our office at the end of 2019, shortly after that, she has developed significant abnormal liver functional test end of 2019, during the height of the pandemic in June 2020, we decided to stop ocrelizumab infusion,   Last office visit in April 2022, already noticed a moderate left upper extremity weakness, spasticity, with mild contraction of left elbow, continued fixed severe spastic lower extremity, deformity,  Deanna Baldwin today complains that worsening left upper extremity function, barely able to move her left arm, it is on her way when she is transferred,  She can no longer reach due to poor vision, was sat up for few hours each day, listen to her TV, she can feed herself using right hand  Status post colostomy, suprapubic catheter placement  Personally reviewed most recent MRI of the brain with without contrast October 2022, chronic demyelinating disease, advanced brain volume loss, extensive bilateral abnormal cerebral white matter signal, severe atrophy of corpus callosum, scattered T2/FLAIR chronic MS lesions, most prominent at the right parietal, right superior frontal, in addition there was  2 DWI positive lesions at the left superior frontal  and subcortical white matter  Laboratory evaluation was in October 2022, calcium was 7.7, otherwise normal BMP,  PHYSICAL EXAM:   Vitals:   05/10/22 1248 05/10/22 1251  BP: 105/73   Pulse: (!) 130 (!) 127  Height: 4\' 11"  (1.499 m)      Body mass index is 21.21 kg/m.  PHYSICAL EXAMNIATION:  Gen: NAD, conversant, well nourised, well groomed                     Cardiovascular: Regular rate rhythm, no peripheral edema, warm, nontender. Eyes: Conjunctivae clear without exudates or hemorrhage Neck: Supple, no carotid bruits. Pulmonary: Decreased air movement at the base of the lung,  NEUROLOGICAL EXAM:  MENTAL STATUS: Speech/cognition: Awake, alert, oriented to history taking and casual conversation, shortness of breath with prolonged talking, wheelchair-bound, slumped to left side,   CRANIAL NERVES: CN II: Pupils were round, sluggish reactive to light, could not finger count, only light sensitivity, CN III, IV, VI: Spontaneous horizontal oscillation CN V: Facial sensation is intact to light touch CN VII: Face is symmetric with normal eye closure  CN VIII: Hearing is normal to causal conversation. CN IX, X: Phonation is normal. CN XI: Head turning and shoulder shrug are intact  MOTOR: Spontaneous and free movement of right upper extremity, fixed contraction of left upper extremity, upon passive stretch, noticed significantly limited range of motion of left shoulder, elbow, complains of pain,  Fixed contraction of bilateral lower extremity, severe adduction, leaning towards the left side,  REFLEXES: Hyperreflexia  SENSORY: No Harwani extinction  COORDINATION: No trunk ataxia  GAIT/STANCE: Not ambulatory  REVIEW OF SYSTEMS:  Full 14 system review of systems performed and notable only for as above All other review of systems were negative.   ALLERGIES: Allergies  Allergen Reactions   Natalizumab Other (See Comments)    Allergy not noted on [facility]    HOME  MEDICATIONS: Current Outpatient Medications  Medication Sig Dispense Refill   acetaminophen (TYLENOL) 325 MG tablet Take 2 tablets (650 mg total) by mouth every 6 (six) hours as needed for mild pain (or Fever >/= 101).     ascorbic acid (VITAMIN C) 500 MG tablet Take 500 mg by mouth in the morning.     baclofen (LIORESAL) 10 MG tablet Take 10 mg by mouth 3 (three) times daily.     bisacodyl (DULCOLAX) 5 MG EC tablet Take 5 mg by mouth daily.     calcitRIOL (ROCALTROL) 0.25 MCG capsule Take 1 capsule (0.25 mcg total) by mouth 2 (two) times daily.     Cyanocobalamin (VITAMIN B-12 PO) Take 500 mcg by mouth daily.     ENSURE (ENSURE) Take 120 mLs by mouth 3 (three) times daily between meals. With meals. Vanilla     ferrous sulfate 325 (65 FE) MG tablet Take 325 mg by mouth daily with breakfast.     KEPPRA 500 MG tablet Take 500 mg by mouth in the morning and at bedtime.     levothyroxine (SYNTHROID) 100 MCG tablet Take 100 mcg by mouth daily before breakfast.     magnesium hydroxide (MILK OF MAGNESIA) 400 MG/5ML suspension Take 30 mLs by mouth every 12 (twelve) hours as needed for mild constipation.     magnesium oxide (MAG-OX) 400 (240 Mg) MG tablet Take 2 tablets (800 mg total) by mouth 2 (two) times daily.     midodrine (PROAMATINE) 2.5 MG tablet Take 1 tablet (  2.5 mg total) by mouth 3 (three) times daily with meals.     Multiple Vitamin (MULTIVITAMIN) tablet Take 1 tablet by mouth daily.     NON FORMULARY Take 120 mLs by mouth See admin instructions. MedPass shake- Drink 120 ml's by mouth three times a day     omeprazole (PRILOSEC) 20 MG capsule Take 20 mg by mouth in the morning.     polyethylene glycol (MIRALAX / GLYCOLAX) packet Take 17 g by mouth See admin instructions. Mix 17 grams of powder into 4-8 ounces of water and drink every morning     Probiotic Product (PROBIOTIC PO) Take 1 tablet by mouth daily.     PROTEIN PO Take 30 mLs by mouth 2 (two) times daily.     SANTYL ointment Apply 1  application topically See admin instructions. Apply to right heel topically every day shift- clean with Dakin's, apply Santyl, and cover with normal saline gauze with foam dressing     tiZANidine (ZANAFLEX) 2 MG tablet Take 2 mg by mouth 3 (three) times daily.     calcium carbonate (TUMS - DOSED IN MG ELEMENTAL CALCIUM) 500 MG chewable tablet Chew 3 tablets (600 mg of elemental calcium total) by mouth 4 (four) times daily -  with meals and at bedtime. (Patient not taking: Reported on 05/10/2022)     ibuprofen (ADVIL,MOTRIN) 200 MG tablet Take 200 mg by mouth every 12 (twelve) hours as needed (for pain). (Patient not taking: Reported on 05/10/2022)     No current facility-administered medications for this visit.    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   Severe sepsis 03/03/2016   Spastic 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    UTI (urinary tract infection) 02/2016    PAST SURGICAL HISTORY: Past Surgical History:  Procedure Laterality Date   DIVERTING ILEOSTOMY N/A 09/03/2016   Procedure: ILEAL CONDUIT  URINARY DIVERSION OPEN;  Surgeon: Crist FatHerrick, Benjamin W, MD;  Location: [facility];  Service: Urology;  Laterality: N/A;    FAMILY HISTORY: Family History  Problem Relation Age of Onset   Diabetes Mellitus I Mother    Mental illness Sister     SOCIAL HISTORY: Social History   Socioeconomic History   Marital status: Single    Spouse name: Not on file   Number of children: 1   Years of education: Not on file   Highest education level: Not on file  Occupational History   Occupation: Disabled  Tobacco Use   Smoking status: Former    Packs/day: 1.00    Years: 10.00    Additional pack years: 0.00    Total pack years: 10.00    Types: Cigarettes     Quit date: 02/01/2010    Years since quitting: 12.2   Smokeless tobacco: Never  Vaping Use   Vaping Use: Never used  Substance and Sexual Activity   Alcohol use: No   Drug use: No   Sexual activity: Not on file  Other Topics Concern   Not on file  Social History Narrative   She is a resident at [facility] nursing [facility].   Right-handed.   Social Determinants of Health   Financial Resource Strain: Low Risk  (08/18/2017)   Overall Financial Resource Strain (CARDIA)  Difficulty of Paying Living Expenses: Not hard at all  Food Insecurity: No Food Insecurity (08/18/2017)   Hunger Vital Sign    Worried About Running Out of Food in the Last Year: Never true    Ran Out of Food in the Last Year: Never true  Transportation Needs: No Transportation Needs (08/18/2017)   PRAPARE - Administrator, Civil Service (Medical): No    Lack of Transportation (Non-Medical): No  Physical Activity: Inactive (08/18/2017)   Exercise Vital Sign    Days of Exercise per Week: 0 days    Minutes of Exercise per Session: 0 min  Stress: No Stress Concern Present (08/18/2017)   [employer] of Occupational Health - Occupational Stress Questionnaire    Feeling of Stress : Not at all  Social Connections: Moderately Isolated (08/18/2017)   Social Connection and Isolation Panel [NHANES]    Frequency of Communication with Friends and Family: More than three times a week    Frequency of Social Gatherings with Friends and Family: Once a week    Attends Religious Services: Never    Database administrator or Organizations: No    Attends Banker Meetings: Never    Marital Status: Never married  Intimate Partner Violence: Not At Risk (08/18/2017)   Humiliation, Afraid, Rape, and Kick questionnaire    Fear of Current or Ex-Partner: No    Emotionally Abused: No    Physically Abused: No    Sexually Abused: No      Levert Feinstein, M.D. Ph.D.  [facility] [address], [address] [place], [place] [address] Ph: [phone] Fax: [phone]  CC:  Deanna Monday, MD [address] [place],  [place] [address]  Deanna Monday, MD

## 2022-05-11 ENCOUNTER — Telehealth: Payer: Self-pay | Admitting: Neurology

## 2022-05-11 LAB — COMPREHENSIVE METABOLIC PANEL
ALT: 69 IU/L — ABNORMAL HIGH (ref 0–32)
AST: 33 IU/L (ref 0–40)
Albumin/Globulin Ratio: 1.1 — ABNORMAL LOW (ref 1.2–2.2)
Albumin: 3.5 g/dL — ABNORMAL LOW (ref 3.9–4.9)
Alkaline Phosphatase: 175 IU/L — ABNORMAL HIGH (ref 44–121)
BUN/Creatinine Ratio: 46 — ABNORMAL HIGH (ref 9–23)
BUN: 26 mg/dL — ABNORMAL HIGH (ref 6–24)
Bilirubin Total: <0.2 mg/dL (ref 0.0–1.2)
CO2: 25 mmol/L (ref 20–29)
Calcium: 9.4 mg/dL (ref 8.7–10.2)
Chloride: 104 mmol/L (ref 96–106)
Creatinine, Ser: 0.56 mg/dL — ABNORMAL LOW (ref 0.57–1.00)
Globulin, Total: 3.3 g/dL (ref 1.5–4.5)
Glucose: 120 mg/dL — ABNORMAL HIGH (ref 70–99)
Potassium: 4 mmol/L (ref 3.5–5.2)
Sodium: 145 mmol/L — ABNORMAL HIGH (ref 134–144)
Total Protein: 6.8 g/dL (ref 6.0–8.5)
eGFR: 115 mL/min/{1.73_m2} (ref >59)

## 2022-05-11 LAB — CBC WITH DIFFERENTIAL
Basophils Absolute: 0 10*3/uL (ref 0.0–0.2)
Basos: 0 %
EOS (ABSOLUTE): 0.2 10*3/uL (ref 0.0–0.4)
Eos: 2 %
Hematocrit: 31 % — ABNORMAL LOW (ref 34.0–46.6)
Hemoglobin: 9.9 g/dL — ABNORMAL LOW (ref 11.1–15.9)
Immature Grans (Abs): 0.1 10*3/uL (ref 0.0–0.1)
Immature Granulocytes: 1 %
Lymphocytes Absolute: 1.3 10*3/uL (ref 0.7–3.1)
Lymphs: 13 %
MCH: 28.1 pg (ref 26.6–33.0)
MCHC: 31.9 g/dL (ref 31.5–35.7)
MCV: 88 fL (ref 79–97)
Monocytes Absolute: 0.5 10*3/uL (ref 0.1–0.9)
Monocytes: 5 %
Neutrophils Absolute: 7.6 10*3/uL — ABNORMAL HIGH (ref 1.4–7.0)
Neutrophils: 79 %
RBC: 3.52 x10E6/uL — ABNORMAL LOW (ref 3.77–5.28)
RDW: 14.5 % (ref 11.7–15.4)
WBC: 9.6 10*3/uL (ref 3.4–10.8)

## 2022-05-11 LAB — TSH: TSH: 0.899 u[IU]/mL (ref 0.450–4.500)

## 2022-05-11 LAB — C-REACTIVE PROTEIN: CRP: 105 mg/L — ABNORMAL HIGH (ref 0–10)

## 2022-05-11 LAB — VITAMIN D 25 HYDROXY (VIT D DEFICIENCY, FRACTURES): Vit D, 25-Hydroxy: 34.7 ng/mL (ref 30.0–100.0)

## 2022-05-11 LAB — FOLATE: Folate: 6.3 ng/mL (ref >3.0)

## 2022-05-11 LAB — VITAMIN B12: Vitamin B-12: 1587 pg/mL — ABNORMAL HIGH (ref 232–1245)

## 2022-05-11 LAB — SEDIMENTATION RATE: Sed Rate: 93 mm/hr — ABNORMAL HIGH (ref 0–32)

## 2022-05-11 LAB — CK: Total CK: 46 U/L (ref 32–182)

## 2022-05-11 NOTE — Telephone Encounter (Signed)
Please call patient and her facility, laboratory evaluation showed significantly elevated ESR C-reactive protein, this could indicate an active infection in this case, such as urinary tract infection, upper respiratory infection or skin breakdown  Continued evidence of mild elevated alkaline phosphate 175, ALT 69  Anemia hemoglobin of 9.9, which is about her baseline level  I have forwarded the lab result to her primary care Sherron Monday, MD, she should contact him for further evaluation and treatment

## 2022-05-11 NOTE — Telephone Encounter (Signed)
Called and relayed lab results to facility for patient

## 2022-06-18 ENCOUNTER — Encounter: Payer: Self-pay | Admitting: Family Medicine

## 2022-06-18 ENCOUNTER — Non-Acute Institutional Stay: Payer: Medicare (Managed Care) | Admitting: Family Medicine

## 2022-06-18 VITALS — BP 82/50 | HR 90 | Temp 96.7°F | Resp 16

## 2022-06-18 DIAGNOSIS — I959 Hypotension, unspecified: Secondary | ICD-10-CM

## 2022-06-18 DIAGNOSIS — G35 Multiple sclerosis: Secondary | ICD-10-CM

## 2022-06-18 NOTE — Progress Notes (Unsigned)
Therapist, nutritional Palliative Care Consult Note Telephone: [phone]  Fax: [phone]   Date of encounter: 06/18/22 10:55 AM PATIENT NAME: Deanna Baldwin [address] [place] [address]   [phone] (home) [phone] (work) DOB: September 08, 1975 MRN: [id number] PRIMARY CARE PROVIDER:    Sherron Monday, MD,  [address] [place] [address] [phone]  REFERRING PROVIDER:   Sherron Monday, MD [address] [place],  [place] [address] [phone]  Health Care Agent/Health Care Power of Attorney:    Contact Information     Name Relation Home Work Mobile   Smith,Crystal Sister   [phone]   Shirlee Latch [phone]  [phone]        I met face to face with patient in [facility] facility. Palliative Care was asked to follow this patient by consultation request of Sherron Monday, MD to address advance care planning and complex medical decision making. This is a follow up visit.  ADVANCE CARE PLANNING/GOALS OF CARE:  Review of an existing advance directive document-MOST.  CODE STATUS: MOST as of 11/11/20: DNR/DNI with limited additional intervention Use of antibiotics and IV fluids if indicated No feeding tube.     ASSESSMENT AND / RECOMMENDATIONS:  PPS: 30% Severe protein calorie malnutrition Recommend continuing high protein source intake and giving medications with foods to increase caloric content. Continue Ensure and Med-Pass in most concentrated form to improve protein intake. Consider appetite stimulant, monitor weight weekly.      Follow up Palliative Care Visit:  Palliative Care continuing to follow up by monitoring for changes in appetite, weight, functional and cognitive status for chronic disease progression and management in agreement with patient's stated goals of care. Next visit in 4 weeks or prn.  This visit was coded based on medical decision making (MDM).  Chief Complaint   Palliative Care is continuing to follow pt for chronic medical management in setting of multiple sclerosis with spastic paraplegia in bedbound patient.  HISTORY OF PRESENT ILLNESS: Deanna Baldwin is a 47 y.o. 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 She has significant protein calorie malnutrition and muscle wasting, a neurogenic bladder with urostomy in her RUQ draining opaque green liquid. She states not liking the food here at the facility but has her family brings her food/snacks to eat.  States currently on her menses.  Denies pain, SOB, nausea/vomiting or skin breakdown.  Last visit with neurology was on 05/10/22, Dr Levert Feinstein who states pt with Secondary Progressive MS with long hx of spastic quadriplegia, spastic LUE with fixed contraction being conservatively managed.  Has been on multiple infusion agents in the past with little benefit and continued progression.  She checked for any reversible causes of pt c/o LUE weakness which had been noted previously and thyroid/ infectious issues ruled out.          Most recent MRI, per Neurologist, of the brain with without contrast October 2022, chronic demyelinating disease, advanced brain volume loss, extensive bilateral abnormal cerebral white matter signal, severe atrophy of corpus callosum, scattered T2/FLAIR chronic MS lesions, most prominent at the right parietal, right superior frontal, in addition there was 2 DWI positive lesions at the left superior frontal and subcortical white matter   ACTIVITIES OF DAILY LIVING: CONTINENT OF BOWEL? No,  Urostomy in place Independent with FEEDING BATHING/DRESSING-assistance.  MOBILITY:  BEDBOUND? Yes  APPETITE? Good WEIGHT:  No recent weight 105 lbs on 11/12/20  CURRENT PROBLEM LIST:  Patient Active Problem  List   Diagnosis Date Noted   Left-sided weakness 05/10/2022   Adult failure to thrive    Pressure injury of skin 10/18/2020   Multiple  sclerosis (HCC) 05/06/2020   DNR (do not resuscitate) discussion 03/08/2018   Atelectasis    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HCC) 05/23/2017   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/19/2017   Hypothyroidism 04/09/2017   Hypocalcemia 04/08/2017   Anemia 04/08/2017   Osteomyelitis (HCC) 03/19/2017   Acute osteomyelitis of left pelvic region and thigh (HCC)    Paraplegia (HCC) 03/09/2017   Chronic constipation 02/13/2017   Chronic pain 12/13/2016   Hypotension 08/31/2016   Vitamin B 12 deficiency 07/09/2016   Pressure ulcer, 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   Sepsis (HCC) 07/07/2016   Unintentional weight loss 07/06/2016   Relapsing remitting multiple sclerosis (HCC) 06/08/2016   Neurogenic bladder 04/09/2016   Anemia of chronic disease 04/06/2016   Dysphagia, oral phase 03/17/2016   Palliative care by specialist    Decubitus ulcer of sacral region, unstageable (HCC)    Protein-calorie malnutrition, severe (HCC)    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) 03/02/2016   Elevated liver function tests 03/02/2016   PAST MEDICAL HISTORY:  Active Ambulatory Problems    Diagnosis Date Noted   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) 03/02/2016   Elevated liver function tests 03/02/2016   Decubitus ulcer of sacral region, unstageable (HCC)    Protein-calorie malnutrition, severe (HCC)    Palliative care by specialist    Dysphagia, oral phase 03/17/2016   Anemia of chronic disease 04/06/2016   Neurogenic bladder 04/09/2016   Relapsing remitting multiple sclerosis (HCC) 06/08/2016   Unintentional weight loss 07/06/2016   Sepsis (HCC) 07/07/2016   Pressure ulcer, 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   Vitamin B 12 deficiency 07/09/2016   Hypotension 08/31/2016   Chronic pain 12/13/2016   Chronic constipation 02/13/2017   Paraplegia (HCC) 03/09/2017   Acute osteomyelitis of left pelvic region and thigh (HCC)    Osteomyelitis (HCC) 03/19/2017   Hypocalcemia 04/08/2017   Anemia 04/08/2017    Hypothyroidism 04/09/2017   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/19/2017   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HCC) 05/23/2017   Atelectasis    DNR (do not resuscitate) discussion 03/08/2018   Multiple sclerosis (HCC) 05/06/2020   Pressure injury of skin 10/18/2020   Adult failure to thrive    Left-sided weakness 05/10/2022   Resolved Ambulatory Problems    Diagnosis Date Noted   Anosmia 02/23/2016   Severe sepsis (HCC) 03/02/2016   Community acquired pneumonia 03/02/2016   UTI (urinary tract infection) 03/02/2016   Buttock wound 03/02/2016   Protein-calorie malnutrition (HCC) 03/02/2016   Pressure ulcer, stage 3 (HCC) 03/03/2016   Hypokalemia 03/05/2016   Hypernatremia    Sepsis secondary to UTI (HCC)    Aspiration pneumonia of left lower lobe due to vomit (HCC)    Multiple sclerosis (HCC)    Muscle abscess    Myofascitis    Other neutropenia (HCC)    Neutropenia (HCC)    Iron deficiency anemia 04/08/2016   Tachycardia 04/08/2016   Citrobacter infection 04/08/2016   Foley catheter in place 04/09/2016   Spastic quadriplegia (HCC) 06/08/2016   Foley catheter problem (HCC)    Sepsis due to urinary tract infection (HCC)    Fever, unspecified 08/31/2016   Abnormal liver function    Pressure injury of skin 08/31/2016   Leukocytosis 09/15/2016   Elevated liver enzymes 02/13/2017   SIRS (systemic inflammatory response syndrome) (HCC) 03/17/2017   Altered mental status  Dehydration    HCAP (healthcare-associated pneumonia) 08/02/2017   Hypercalcemia 10/14/2020   Past Medical History:  Diagnosis Date   Dysrhythmia    MS (multiple sclerosis) (HCC)    Pneumonia 02/2016   Protein calorie malnutrition (HCC)     Preferred Pharmacy: ALLERGIES:  Allergies  Allergen Reactions   Natalizumab Other (See Comments)    Allergy not noted on MAR     PERTINENT MEDICATIONS:  Outpatient Encounter Medications as of 06/18/2022  Medication Sig   acetaminophen (TYLENOL) 325 MG tablet Take 2 tablets  (650 mg total) by mouth every 6 (six) hours as needed for mild pain (or Fever >/= 101).   ascorbic acid (VITAMIN C) 500 MG tablet Take 500 mg by mouth in the morning.   baclofen (LIORESAL) 10 MG tablet Take 10 mg by mouth 3 (three) times daily.   bisacodyl (DULCOLAX) 5 MG EC tablet Take 5 mg by mouth daily.   calcitRIOL (ROCALTROL) 0.25 MCG capsule Take 1 capsule (0.25 mcg total) by mouth 2 (two) times daily.   calcium carbonate (TUMS - DOSED IN MG ELEMENTAL CALCIUM) 500 MG chewable tablet Chew 3 tablets (600 mg of elemental calcium total) by mouth 4 (four) times daily -  with meals and at bedtime. (Patient not taking: Reported on 05/10/2022)   Cyanocobalamin (VITAMIN B-12 PO) Take 500 mcg by mouth daily.   ENSURE (ENSURE) Take 120 mLs by mouth 3 (three) times daily between meals. With meals. Vanilla   ferrous sulfate 325 (65 FE) MG tablet Take 325 mg by mouth daily with breakfast.   ibuprofen (ADVIL,MOTRIN) 200 MG tablet Take 200 mg by mouth every 12 (twelve) hours as needed (for pain). (Patient not taking: Reported on 05/10/2022)   KEPPRA 500 MG tablet Take 500 mg by mouth in the morning and at bedtime.   levothyroxine (SYNTHROID) 100 MCG tablet Take 100 mcg by mouth daily before breakfast.   magnesium hydroxide (MILK OF MAGNESIA) 400 MG/5ML suspension Take 30 mLs by mouth every 12 (twelve) hours as needed for mild constipation.   magnesium oxide (MAG-OX) 400 (240 Mg) MG tablet Take 2 tablets (800 mg total) by mouth 2 (two) times daily.   midodrine (PROAMATINE) 2.5 MG tablet Take 1 tablet (2.5 mg total) by mouth 3 (three) times daily with meals.   Multiple Vitamin (MULTIVITAMIN) tablet Take 1 tablet by mouth daily.   NON FORMULARY Take 120 mLs by mouth See admin instructions. MedPass shake- Drink 120 ml's by mouth three times a day   omeprazole (PRILOSEC) 20 MG capsule Take 20 mg by mouth in the morning.   polyethylene glycol (MIRALAX / GLYCOLAX) packet Take 17 g by mouth See admin instructions. Mix  17 grams of powder into 4-8 ounces of water and drink every morning   Probiotic Product (PROBIOTIC PO) Take 1 tablet by mouth daily.   PROTEIN PO Take 30 mLs by mouth 2 (two) times daily.   SANTYL ointment Apply 1 application topically See admin instructions. Apply to right heel topically every day shift- clean with Dakin's, apply Santyl, and cover with normal saline gauze with foam dressing   tiZANidine (ZANAFLEX) 2 MG tablet Take 2 mg by mouth 3 (three) times daily.   No facility-administered encounter medications on file as of 06/18/2022.    History obtained from review of EMR, discussion with facility staff/caregiver and/or patient.     I reviewed available labs, medications, imaging, studies and related documents from the EMR.  There were no new records/imaging since last visit.  Physical Exam: GENERAL: NAD EENT:  Lateral gaze nystagmus noted on  LUNGS: CTAB, no increased work of breathing, room air CARDIAC:  S1S2, RRR with no MRG, No edema/cyanosis ABD:  Normo-active BS x 4 quads, soft, non-tender, ostomy pouch on RUQ draining dark green liquid stool. SP cath in place. EXTREMITIES: LUE adducted across abdomen/contracted, Yes muscle atrophy/subcutaneous fat loss generalized NEURO:  Noted weakness of BLE, no observed cognitive impairment  PSYCH:  non-anxious affect, A & O x 3  Thank you for the opportunity to participate in the care of [facility]. Please call our main office at [phone] if we can be of additional assistance.    Joycelyn Man FNP-C  [facility]  Phone:  [phone]

## 2022-06-22 ENCOUNTER — Encounter: Payer: Self-pay | Admitting: Family Medicine

## 2022-06-22 DIAGNOSIS — G35 Multiple sclerosis: Secondary | ICD-10-CM | POA: Insufficient documentation

## 2022-11-18 NOTE — Progress Notes (Signed)
Chief Complaint  Patient presents with   Follow-up    Patient in room #3 with her caregiver. Patient states she here today discuss the new Rx that Dr Terrace Arabia wanted her to take. Patient states she doing great.      ASSESSMENT AND PLAN  Deanna Baldwin is a 47 y.o. 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  Continue supportive care by SNF/PCP. Follow up with Dr. Terrace Arabia as needed    DIAGNOSTIC DATA (LABS, IMAGING, TESTING) - I reviewed patient records, labs, notes, testing and imaging myself where available.   MEDICAL HISTORY:  Update 11/22/2022 JM: Patient returns for follow-up visit accompanied by SNF aide.  Continues to reside at [facility] facility. Reports doing well and stable since prior visit. Continues LUE and BLE contractures, no changes or new concerns since prior visit. Per SNF MAR, continues on both baclofen and tizanidine for spasticity.  Remains wheelchair-bound, transfer via hoyer lift.  Vision remains poor but unchanged, can see objects/shadows in bright light, limited to no vision in dark setting.   Lab work after prior visit showed significantly elevated ESR and CRP suspicion for active infection such as UTI, URI or skin breakdown, mild elevation of alkaline phosphatase and chronic anemia, advised f/u with PCP for further management and treatment.  Routinely follows with palliative care      Consult  visit 05/10/2022 JM: Deanna Baldwin is a 47 year old right-handed female,, seen in request by her primary care doctor   Sherron Monday, for evaluation of worsening left arm weakness, she is brought in by nursing home staff at today's visit on May 10, 2022  I reviewed and summarized the referring note.  Deanna Baldwin is well-known to our clinic, she has multiple sclerosis, severe contraction of bilateral lower extremity, last visit with our office was in September 2020  She was diagnosed with relapsing remitting multiple sclerosis since age 24, initially presented with optic neuritis, then difficulty walking, frequent flareup during the initial years, eventually wheelchair-bound since 2011  She was under the care of of [facility] neurologist Dr. Harlen Labs until 2017, attempted different immunomodulation treatment for her progressive relapsing remitting multiple sclerosis during the initial years, allergic reaction to Silvestre Moment, rituximab from 2013 1000 mg every 6 months, until 2016, due to her declining functional status, hard to keep up with schedule, last infusion was in December 2016  By then, she has developed significant lower extremity spasticity, wheelchair-bound,  Around that time, she also had multiple hospital admission for urinary tract infection, decubitus ulcer,  Previous MRI demonstrated extensive supratentorium involvement, also has extensive cervical cord abnormality, multiple patchy area signal abnormality involving the whole cervical cord, visualized the brainstem, and upper thoracic region  For a while, I attempted to alleviate lower extremity spasticity by using electrical stimulation guided xeomin injection, was not sure about the benefit, eventually stopped around 2020,   She was switched from rituximab infusion to ocrelizumab at our office at the end of 2019, shortly after that,  she has developed significant abnormal liver functional test end of 2019, during the height of the  pandemic in June 2020, we decided to stop ocrelizumab infusion,   Last office visit in April 2022, already noticed a moderate left upper extremity weakness, spasticity, with mild contraction of left elbow, continued fixed severe spastic lower extremity, deformity,  Deanna Baldwin today complains that worsening left upper extremity function, barely able to move her left arm, it is on her way when she is transferred,  She can no longer reach due to poor vision, was sat up for few hours each day, listen to her TV, she can feed herself using right hand  Status post colostomy, suprapubic catheter placement  Personally reviewed most recent MRI of the brain with without contrast October 2022, chronic demyelinating disease, advanced brain volume loss, extensive bilateral abnormal cerebral white matter signal, severe atrophy of corpus callosum, scattered T2/FLAIR chronic MS lesions, most prominent at the right parietal, right superior frontal, in addition there was 2 DWI positive lesions at the left superior frontal and subcortical white matter  Laboratory evaluation was in October 2022, calcium was 7.7, otherwise normal BMP,       PHYSICAL EXAM:   Vitals:   11/22/22 1113  BP: (!) 77/56  Pulse: (!) 122  Height: 4\' 11"  (1.499 m)    Body mass index is 21.21 kg/m.  PHYSICAL EXAMNIATION:  Gen: NAD, frail pleasant middle-age female, conversant, well nourised, well groomed                     Cardiovascular: Regular rate rhythm, no peripheral edema, warm, nontender. Eyes: Conjunctivae clear without exudates or hemorrhage Neck: Supple, no carotid bruits.  NEUROLOGICAL EXAM:  MENTAL STATUS: Speech/cognition: Awake, alert, oriented to history taking and casual conversation, wheelchair-bound, slumped to left side,  CRANIAL NERVES: CN II: Pupils were round, sluggish reactive to light, could not finger count, only light sensitivity, CN III, IV, VI: Spontaneous horizontal oscillation CN V: Facial  sensation is intact to light touch CN VII: Face is symmetric with normal eye closure  CN VIII: Hearing is normal to causal conversation. CN IX, X: Phonation is normal. CN XI: Head turning and shoulder shrug are intact  MOTOR: Spontaneous and free movement of right upper extremity, fixed contraction of left upper extremity, upon passive stretch, noticed significantly limited range of motion of left shoulder, elbow, complains of pain, Fixed contraction of bilateral lower extremity, severe adduction, leaning towards the left side,  REFLEXES: Hyperreflexia  COORDINATION: No trunk ataxia  GAIT/STANCE: Not ambulatory    REVIEW OF SYSTEMS:  Full 14 system review of systems performed and notable only for as above All other review of systems were negative.   ALLERGIES: Allergies  Allergen Reactions   Natalizumab Other (See Comments)    Allergy not noted on [facility]    HOME MEDICATIONS: Current Outpatient Medications  Medication Sig Dispense Refill   acetaminophen (TYLENOL) 325 MG tablet Take 2 tablets (650 mg total) by mouth every 6 (six) hours as needed for mild pain (or Fever >/= 101).     ascorbic acid (VITAMIN C) 500 MG tablet Take 500 mg by mouth in the morning.     aspirin EC 81 MG tablet Take 81 mg by mouth daily. Swallow whole.     baclofen (LIORESAL) 10 MG tablet Take 10 mg by mouth 3 (three) times daily.     bisacodyl (DULCOLAX) 5 MG EC tablet Take 5 mg by mouth daily.  calcitRIOL (ROCALTROL) 0.25 MCG capsule Take 1 capsule (0.25 mcg total) by mouth 2 (two) times daily.     Cyanocobalamin (VITAMIN B-12 PO) Take 500 mcg by mouth daily.     ENSURE (ENSURE) Take 120 mLs by mouth 3 (three) times daily between meals. With meals. Vanilla     ferrous sulfate 325 (65 FE) MG tablet Take 325 mg by mouth daily with breakfast.     KEPPRA 500 MG tablet Take 500 mg by mouth in the morning and at bedtime.     levothyroxine (SYNTHROID) 100 MCG tablet Take 100 mcg by mouth daily before  breakfast.     magnesium hydroxide (MILK OF MAGNESIA) 400 MG/5ML suspension Take 30 mLs by mouth every 12 (twelve) hours as needed for mild constipation.     magnesium oxide (MAG-OX) 400 (240 Mg) MG tablet Take 2 tablets (800 mg total) by mouth 2 (two) times daily.     midodrine (PROAMATINE) 2.5 MG tablet Take 1 tablet (2.5 mg total) by mouth 3 (three) times daily with meals.     Multiple Vitamin (MULTIVITAMIN) tablet Take 1 tablet by mouth daily.     NON FORMULARY Take 120 mLs by mouth See admin instructions. MedPass shake- Drink 120 ml's by mouth three times a day     omeprazole (PRILOSEC) 20 MG capsule Take 20 mg by mouth in the morning.     polyethylene glycol (MIRALAX / GLYCOLAX) packet Take 17 g by mouth See admin instructions. Mix 17 grams of powder into 4-8 ounces of water and drink every morning     Probiotic Product (PROBIOTIC PO) Take 1 tablet by mouth daily.     PROTEIN PO Take 30 mLs by mouth 2 (two) times daily.     rosuvastatin (CRESTOR) 20 MG tablet Take 20 mg by mouth at bedtime.     SANTYL ointment Apply 1 application topically See admin instructions. Apply to right heel topically every day shift- clean with Dakin's, apply Santyl, and cover with normal saline gauze with foam dressing     tiZANidine (ZANAFLEX) 2 MG tablet Take 2 mg by mouth 3 (three) times daily.     Vitamin D, Ergocalciferol, (DRISDOL) 1.25 MG (50000 UNIT) CAPS capsule Take 50,000 Units by mouth every 7 (seven) days.     calcium carbonate (TUMS - DOSED IN MG ELEMENTAL CALCIUM) 500 MG chewable tablet Chew 3 tablets (600 mg of elemental calcium total) by mouth 4 (four) times daily -  with meals and at bedtime. (Patient not taking: Reported on 05/10/2022)     ibuprofen (ADVIL,MOTRIN) 200 MG tablet Take 200 mg by mouth every 12 (twelve) hours as needed (for pain). (Patient not taking: Reported on 05/10/2022)     No current facility-administered medications for this visit.    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   Sepsis (HCC) 07/07/2016   Severe sepsis (HCC) 03/03/2016   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)    UTI (urinary tract infection) 02/2016    PAST SURGICAL HISTORY: Past Surgical History:  Procedure Laterality Date   DIVERTING ILEOSTOMY N/A 09/03/2016   Procedure: ILEAL CONDUIT  URINARY DIVERSION OPEN;  Surgeon: Crist Fat, MD;  Location: [facility];  Service: Urology;  Laterality: N/A;    FAMILY HISTORY: Family History  Problem Relation Age of Onset   Diabetes Mellitus I Mother    Mental illness Sister     SOCIAL HISTORY: Social History   Socioeconomic History   Marital status: Single    Spouse name: Not on file   Number of children: 1   Years of education: Not on file   Highest education level: Not on file  Occupational History   Occupation: Disabled  Tobacco Use   Smoking status: Former    Current packs/day: 0.00    Average packs/day: 1 pack/day for 10.0 years (10.0 ttl pk-yrs)    Types: Cigarettes    Start date: 02/02/2000    Quit date: 02/01/2010    Years since quitting: 12.8   Smokeless tobacco: Never  Vaping Use   Vaping status: Never Used  Substance and Sexual Activity   Alcohol use: No   Drug use: No   Sexual activity: Not on file  Other Topics Concern   Not on file  Social History Narrative   She is a resident at [facility] nursing home.   Right-handed.   Social Determinants of Health   Financial Resource Strain: Low Risk  (08/18/2017)   Overall Financial Resource Strain (CARDIA)    Difficulty of Paying Living Expenses: Not hard at all  Food Insecurity: No Food Insecurity (08/18/2017)   Hunger Vital Sign    Worried About Running Out of Food in the Last Year: Never true     Ran Out of Food in the Last Year: Never true  Transportation Needs: No Transportation Needs (08/18/2017)   PRAPARE - Administrator, Civil Service (Medical): No    Lack of Transportation (Non-Medical): No  Physical Activity: Inactive (08/18/2017)   Exercise Vital Sign    Days of Exercise per Week: 0 days    Minutes of Exercise per Session: 0 min  Stress: No Stress Concern Present (08/18/2017)   [employer] of Occupational Health - Occupational Stress Questionnaire    Feeling of Stress : Not at all  Social Connections: Moderately Isolated (08/18/2017)   Social Connection and Isolation Panel [NHANES]    Frequency of Communication with Friends and Family: More than three times a week    Frequency of Social Gatherings with Friends and Family: Once a week    Attends Religious Services: Never    Database administrator or Organizations: No    Attends Banker Meetings: Never    Marital Status: Never married  Intimate Partner Violence: Not At Risk (08/18/2017)   Humiliation, Afraid, Rape, and Kick questionnaire    Fear of Current or Ex-Partner: No    Emotionally Abused: No    Physically Abused: No    Sexually Abused: No      I spent 20 minutes of face-to-face and non-face-to-face time with patient.  This included previsit chart review, lab review, study review, order entry, electronic health record documentation, patient education and discussion regarding above diagnoses and treatment plan and answered all other questions to patient's satisfaction  Ihor Austin, [facility]  [facility] [address] [place], [place] [address]  Phone [phone] Fax [phone] Note: This document was prepared with digital dictation and possible smart phrase technology. Any transcriptional errors that result from this process are unintentional.

## 2022-11-22 ENCOUNTER — Encounter: Payer: Self-pay | Admitting: Adult Health

## 2022-11-22 ENCOUNTER — Ambulatory Visit (INDEPENDENT_AMBULATORY_CARE_PROVIDER_SITE_OTHER): Payer: Medicare (Managed Care) | Admitting: Adult Health

## 2022-11-22 VITALS — BP 77/56 | HR 122 | Ht 59.0 in

## 2022-11-22 DIAGNOSIS — G35 Multiple sclerosis: Secondary | ICD-10-CM | POA: Diagnosis not present

## 2022-11-22 NOTE — Patient Instructions (Signed)
Your Plan:  No further recommendations from our standpoint at this time   Continue supportive measures which can be managed by PCP/facility    Follow up with Dr. Terrace Arabia as needed     Thank you for coming to see [place] at [facility]. I hope we have been able to provide you high quality care today.  You may receive a patient satisfaction survey over the next few weeks. We would appreciate your feedback and comments so that we may continue to improve ourselves and the health of our patients.

## 2022-12-03 ENCOUNTER — Other Ambulatory Visit: Payer: Self-pay

## 2022-12-03 ENCOUNTER — Emergency Department (HOSPITAL_COMMUNITY): Payer: Medicare (Managed Care)

## 2022-12-03 ENCOUNTER — Encounter (HOSPITAL_COMMUNITY): Payer: Self-pay | Admitting: Internal Medicine

## 2022-12-03 ENCOUNTER — Inpatient Hospital Stay (HOSPITAL_COMMUNITY)
Admission: EM | Admit: 2022-12-03 | Discharge: 2022-12-07 | DRG: 314 | Disposition: A | Discharge status: Skilled Nursing Facility | Payer: Medicare (Managed Care) | Source: Skilled Nursing Facility | Attending: Family Medicine | Admitting: Family Medicine

## 2022-12-03 DIAGNOSIS — Z79890 Hormone replacement therapy (postmenopausal): Secondary | ICD-10-CM

## 2022-12-03 DIAGNOSIS — Z833 Family history of diabetes mellitus: Secondary | ICD-10-CM

## 2022-12-03 DIAGNOSIS — Z87891 Personal history of nicotine dependence: Secondary | ICD-10-CM

## 2022-12-03 DIAGNOSIS — G40909 Epilepsy, unspecified, not intractable, without status epilepticus: Secondary | ICD-10-CM | POA: Diagnosis present

## 2022-12-03 DIAGNOSIS — Z933 Colostomy status: Secondary | ICD-10-CM

## 2022-12-03 DIAGNOSIS — Z1152 Encounter for screening for COVID-19: Secondary | ICD-10-CM

## 2022-12-03 DIAGNOSIS — I959 Hypotension, unspecified: Principal | ICD-10-CM | POA: Diagnosis present

## 2022-12-03 DIAGNOSIS — Z6820 Body mass index (BMI) 20.0-20.9, adult: Secondary | ICD-10-CM

## 2022-12-03 DIAGNOSIS — E039 Hypothyroidism, unspecified: Secondary | ICD-10-CM | POA: Diagnosis present

## 2022-12-03 DIAGNOSIS — E871 Hypo-osmolality and hyponatremia: Secondary | ICD-10-CM | POA: Diagnosis present

## 2022-12-03 DIAGNOSIS — Z888 Allergy status to other drugs, medicaments and biological substances status: Secondary | ICD-10-CM

## 2022-12-03 DIAGNOSIS — R532 Functional quadriplegia: Secondary | ICD-10-CM | POA: Diagnosis present

## 2022-12-03 DIAGNOSIS — L89213 Pressure ulcer of right hip, stage 3: Secondary | ICD-10-CM | POA: Diagnosis present

## 2022-12-03 DIAGNOSIS — D638 Anemia in other chronic diseases classified elsewhere: Secondary | ICD-10-CM | POA: Diagnosis present

## 2022-12-03 DIAGNOSIS — Z936 Other artificial openings of urinary tract status: Secondary | ICD-10-CM

## 2022-12-03 DIAGNOSIS — E860 Dehydration: Secondary | ICD-10-CM | POA: Diagnosis present

## 2022-12-03 DIAGNOSIS — Z87440 Personal history of urinary (tract) infections: Secondary | ICD-10-CM

## 2022-12-03 DIAGNOSIS — L89893 Pressure ulcer of other site, stage 3: Secondary | ICD-10-CM | POA: Diagnosis present

## 2022-12-03 DIAGNOSIS — Z7982 Long term (current) use of aspirin: Secondary | ICD-10-CM

## 2022-12-03 DIAGNOSIS — L89223 Pressure ulcer of left hip, stage 3: Secondary | ICD-10-CM | POA: Diagnosis present

## 2022-12-03 DIAGNOSIS — I10 Essential (primary) hypertension: Secondary | ICD-10-CM | POA: Diagnosis present

## 2022-12-03 DIAGNOSIS — R569 Unspecified convulsions: Secondary | ICD-10-CM

## 2022-12-03 DIAGNOSIS — G8929 Other chronic pain: Secondary | ICD-10-CM | POA: Diagnosis present

## 2022-12-03 DIAGNOSIS — Z66 Do not resuscitate: Secondary | ICD-10-CM | POA: Diagnosis present

## 2022-12-03 DIAGNOSIS — Z818 Family history of other mental and behavioral disorders: Secondary | ICD-10-CM

## 2022-12-03 DIAGNOSIS — Z79899 Other long term (current) drug therapy: Secondary | ICD-10-CM

## 2022-12-03 DIAGNOSIS — R579 Shock, unspecified: Principal | ICD-10-CM

## 2022-12-03 DIAGNOSIS — E875 Hyperkalemia: Secondary | ICD-10-CM | POA: Diagnosis present

## 2022-12-03 DIAGNOSIS — K5909 Other constipation: Secondary | ICD-10-CM | POA: Diagnosis present

## 2022-12-03 DIAGNOSIS — E43 Unspecified severe protein-calorie malnutrition: Secondary | ICD-10-CM | POA: Diagnosis present

## 2022-12-03 DIAGNOSIS — G35 Multiple sclerosis: Secondary | ICD-10-CM | POA: Diagnosis present

## 2022-12-03 DIAGNOSIS — E872 Acidosis, unspecified: Secondary | ICD-10-CM | POA: Diagnosis present

## 2022-12-03 LAB — CBC WITH DIFFERENTIAL/PLATELET
Abs Immature Granulocytes: 0.08 10*3/uL — ABNORMAL HIGH (ref 0.00–0.07)
Basophils Absolute: 0 10*3/uL (ref 0.0–0.1)
Basophils Relative: 0 %
Eosinophils Absolute: 0 10*3/uL (ref 0.0–0.5)
Eosinophils Relative: 0 %
HCT: 33.2 % — ABNORMAL LOW (ref 36.0–46.0)
Hemoglobin: 9.8 g/dL — ABNORMAL LOW (ref 12.0–15.0)
Immature Granulocytes: 1 %
Lymphocytes Relative: 13 %
Lymphs Abs: 1.4 10*3/uL (ref 0.7–4.0)
MCH: 28 pg (ref 26.0–34.0)
MCHC: 29.5 g/dL — ABNORMAL LOW (ref 30.0–36.0)
MCV: 94.9 fL (ref 80.0–100.0)
Monocytes Absolute: 0.7 10*3/uL (ref 0.1–1.0)
Monocytes Relative: 6 %
Neutro Abs: 8.4 10*3/uL — ABNORMAL HIGH (ref 1.7–7.7)
Neutrophils Relative %: 80 %
Platelets: 621 10*3/uL — ABNORMAL HIGH (ref 150–400)
RBC: 3.5 MIL/uL — ABNORMAL LOW (ref 3.87–5.11)
RDW: 14.9 % (ref 11.5–15.5)
WBC: 10.6 10*3/uL — ABNORMAL HIGH (ref 4.0–10.5)
nRBC: 0 % (ref 0.0–0.2)

## 2022-12-03 LAB — I-STAT CG4 LACTIC ACID, ED
Lactic Acid, Venous: 1.7 mmol/L (ref 0.5–1.9)
Lactic Acid, Venous: 2 mmol/L (ref 0.5–1.9)

## 2022-12-03 LAB — COMPREHENSIVE METABOLIC PANEL
ALT: 14 U/L (ref 0–44)
AST: 23 U/L (ref 15–41)
Albumin: 2.3 g/dL — ABNORMAL LOW (ref 3.5–5.0)
Alkaline Phosphatase: 115 U/L (ref 38–126)
Anion gap: 13 (ref 5–15)
BUN: 61 mg/dL — ABNORMAL HIGH (ref 6–20)
CO2: 25 mmol/L (ref 22–32)
Calcium: 8.9 mg/dL (ref 8.9–10.3)
Chloride: 93 mmol/L — ABNORMAL LOW (ref 98–111)
Creatinine, Ser: 0.87 mg/dL (ref 0.44–1.00)
GFR, Estimated: >60 mL/min (ref >60)
Glucose, Bld: 116 mg/dL — ABNORMAL HIGH (ref 70–99)
Potassium: 5.3 mmol/L — ABNORMAL HIGH (ref 3.5–5.1)
Sodium: 131 mmol/L — ABNORMAL LOW (ref 135–145)
Total Bilirubin: 0.5 mg/dL (ref 0.3–1.2)
Total Protein: 7.7 g/dL (ref 6.5–8.1)

## 2022-12-03 LAB — CBG MONITORING, ED: Glucose-Capillary: 132 mg/dL — ABNORMAL HIGH (ref 70–99)

## 2022-12-03 LAB — RESP PANEL BY RT-PCR (RSV, FLU A&B, COVID)  RVPGX2
Influenza A by PCR: NEGATIVE
Influenza B by PCR: NEGATIVE
Resp Syncytial Virus by PCR: NEGATIVE
SARS Coronavirus 2 by RT PCR: NEGATIVE

## 2022-12-03 LAB — HCG, SERUM, QUALITATIVE: Preg, Serum: NEGATIVE

## 2022-12-03 LAB — PROTIME-INR
INR: 1.2 (ref 0.8–1.2)
Prothrombin Time: 15.4 s — ABNORMAL HIGH (ref 11.4–15.2)

## 2022-12-03 LAB — APTT: aPTT: 26 s (ref 24–36)

## 2022-12-03 LAB — LACTIC ACID, PLASMA: Lactic Acid, Venous: 2.2 mmol/L (ref 0.5–1.9)

## 2022-12-03 MED ORDER — ONDANSETRON HCL 4 MG PO TABS: 4.0000 mg | ORAL_TABLET | Freq: Four times a day (QID) | ORAL | Status: DC | PRN

## 2022-12-03 MED ORDER — LACTATED RINGERS IV SOLN: INTRAVENOUS | Status: AC

## 2022-12-03 MED ORDER — PANTOPRAZOLE SODIUM 40 MG PO TBEC
40.0000 mg | DELAYED_RELEASE_TABLET | Freq: Every day | ORAL | Status: DC
  Administered 2022-12-03 – 2022-12-07 (×5): 40 mg via ORAL
  Filled 2022-12-03 (×5): qty 1

## 2022-12-03 MED ORDER — SODIUM CHLORIDE 0.9 % IV BOLUS
500.0000 mL | Freq: Once | INTRAVENOUS | Status: AC
  Administered 2022-12-03: 500 mL via INTRAVENOUS

## 2022-12-03 MED ORDER — LEVETIRACETAM 500 MG PO TABS
500.0000 mg | ORAL_TABLET | Freq: Two times a day (BID) | ORAL | Status: DC
  Administered 2022-12-03 – 2022-12-07 (×8): 500 mg via ORAL
  Filled 2022-12-03 (×8): qty 1

## 2022-12-03 MED ORDER — LEVOTHYROXINE SODIUM 100 MCG PO TABS
100.0000 ug | ORAL_TABLET | Freq: Every day | ORAL | Status: DC
  Administered 2022-12-04 – 2022-12-07 (×4): 100 ug via ORAL
  Filled 2022-12-03 (×4): qty 1

## 2022-12-03 MED ORDER — ONDANSETRON HCL 4 MG/2ML IJ SOLN: 4.0000 mg | Freq: Four times a day (QID) | INTRAMUSCULAR | Status: DC | PRN

## 2022-12-03 MED ORDER — CALCITRIOL 0.25 MCG PO CAPS
0.2500 ug | ORAL_CAPSULE | Freq: Two times a day (BID) | ORAL | Status: DC
  Administered 2022-12-03 – 2022-12-07 (×8): 0.25 ug via ORAL
  Filled 2022-12-03 (×9): qty 1

## 2022-12-03 MED ORDER — ENOXAPARIN SODIUM 30 MG/0.3ML IJ SOSY
30.0000 mg | PREFILLED_SYRINGE | INTRAMUSCULAR | Status: DC
Start: 2022-12-03 — End: 2022-12-04
  Administered 2022-12-03: 30 mg via SUBCUTANEOUS
  Filled 2022-12-03: qty 0.3

## 2022-12-03 MED ORDER — SODIUM CHLORIDE 0.9 % IV BOLUS
1000.0000 mL | Freq: Once | INTRAVENOUS | Status: AC
  Administered 2022-12-03: 1000 mL via INTRAVENOUS

## 2022-12-03 MED ORDER — ROSUVASTATIN CALCIUM 10 MG PO TABS
20.0000 mg | ORAL_TABLET | Freq: Every day | ORAL | Status: DC
  Administered 2022-12-03 – 2022-12-06 (×4): 20 mg via ORAL
  Filled 2022-12-03 (×4): qty 2

## 2022-12-03 MED ORDER — MAGNESIUM OXIDE -MG SUPPLEMENT 400 (240 MG) MG PO TABS
800.0000 mg | ORAL_TABLET | Freq: Two times a day (BID) | ORAL | Status: DC
  Administered 2022-12-03 – 2022-12-07 (×8): 800 mg via ORAL
  Filled 2022-12-03 (×8): qty 2

## 2022-12-03 MED ORDER — MIDODRINE HCL 5 MG PO TABS
2.5000 mg | ORAL_TABLET | Freq: Three times a day (TID) | ORAL | Status: DC
  Administered 2022-12-03: 2.5 mg via ORAL
  Filled 2022-12-03 (×2): qty 1

## 2022-12-03 MED ORDER — ACETAMINOPHEN 650 MG RE SUPP: 650.0000 mg | Freq: Four times a day (QID) | RECTAL | Status: DC | PRN

## 2022-12-03 MED ORDER — BACLOFEN 10 MG PO TABS
10.0000 mg | ORAL_TABLET | Freq: Three times a day (TID) | ORAL | Status: DC
  Administered 2022-12-03 – 2022-12-07 (×12): 10 mg via ORAL
  Filled 2022-12-03 (×12): qty 1

## 2022-12-03 MED ORDER — ACETAMINOPHEN 325 MG PO TABS
650.0000 mg | ORAL_TABLET | Freq: Four times a day (QID) | ORAL | Status: DC | PRN
  Administered 2022-12-07: 650 mg via ORAL
  Filled 2022-12-03: qty 2

## 2022-12-03 MED ORDER — LACTATED RINGERS IV BOLUS
1000.0000 mL | Freq: Once | INTRAVENOUS | Status: AC
  Administered 2022-12-03: 1000 mL via INTRAVENOUS

## 2022-12-03 NOTE — ED Notes (Signed)
After multiple attempts staff was not successful in getting urine from I&O catheter. Bladder scan completed resulting in 0ml.

## 2022-12-03 NOTE — ED Notes (Signed)
Cheryl from [place] [facility] called inquiring f/u on patient

## 2022-12-03 NOTE — H&P (Signed)
History and Physical    Patient: Deanna Baldwin NGE:[id number] DOB: 1975-06-28 DOA: 12/03/2022 DOS: the patient was seen and examined on 12/03/2022 PCP: System, Provider Not In  Patient coming from: SNF  Chief Complaint:  Chief Complaint  Patient presents with   Hypotension   HPI: [name] is a 47 y.o. female with medical history significant of sacral ulcers, sacral osteomyelitis, hypercalcemia, history of pneumonia, protein calorie malnutrition, multiple sclerosis with spastic paraplegia, colostomy, history of UTIs, history of sepsis, vitamin B12 deficiency, hypothyroidism, anemia of chronic disease who was brought from her facility due to hypotension.  However, the patient denied fever, sore throat, rhinorrhea, productive cough, chest or abdominal pain, nausea, emesis, diarrhea or any other symptoms.  She stated she does not feel like she is getting ill.  She also stated her pressure wounds are getting better.  Lab work: Her CBC showed a white count 10.6, hemoglobin 9.8 g/dL and platelets 401.  PT 15.4, INR 1.2 and PTT 26.  Lactic acid was normal.  Negative coronavirus, influenza and RSV PCR.  CMP showed a sodium 131, potassium 5.3, chloride 93 and CO2 25 mmol/L.  Glucose 116, BUN 61 and creatinine 0.87 mg/dL.  Her LFTs were normal with EF session with an albumin level 2.3 g/dL.  Imaging: Portable 1 view chest radiograph    ED course: Initial vital signs were temperature 98 F, pulse 113, respiration 17, BP 101/77 mmHg O2 sat 97% on room air.  Review of Systems: As mentioned in the history of present illness. All other systems reviewed and are negative. 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 Her smoking use included cigarettes. She started smoking about 22 years ago. She has a 10 pack-year smoking history. She has never used smokeless tobacco. She reports that she does not drink alcohol and does not use drugs.  Allergies  Allergen Reactions   Natalizumab Other (See Comments)    Allergy not noted on MAR    Family History  Problem Relation Age of Onset   Diabetes Mellitus I Mother    Mental illness Sister     Prior to Admission medications   Medication Sig Start Date End Date Taking? Authorizing Provider  acetaminophen (TYLENOL) 325 MG tablet Take 2 tablets (650 mg total) by mouth every 6 (six) hours as needed for mild pain (or Fever >/= 101). 04/11/17   Rodolph Bong, MD  ascorbic acid (VITAMIN C) 500 MG tablet Take 500 mg by mouth in the morning.    [provider]  aspirin EC 81 MG tablet Take 81 mg by mouth daily. Swallow whole.    [provider]  baclofen (LIORESAL) 10 MG tablet Take 10 mg by mouth 3 (three) times daily.    [provider]  bisacodyl (DULCOLAX) 5 MG EC tablet Take 5 mg by mouth daily.    [provider]  calcitRIOL (ROCALTROL) 0.25 MCG capsule Take 1 capsule (0.25 mcg total) by mouth 2 (two) times daily. 11/12/20   Lanae Boast, MD  calcium carbonate (TUMS - DOSED IN MG ELEMENTAL CALCIUM) 500 MG chewable tablet Chew 3 tablets (600 mg of elemental calcium total) by mouth 4 (four) times daily -  with meals and at bedtime. Patient not taking: Reported on 05/10/2022 11/12/20   Lanae Boast, MD  Cyanocobalamin (VITAMIN  B-12 PO) Take 500 mcg by mouth daily.    [provider]  ENSURE (ENSURE) Take 120 mLs by mouth 3 (three) times daily between meals. With meals. Vanilla    [provider]  ferrous sulfate 325 (65 FE) MG tablet Take 325 mg by mouth daily with breakfast.    [provider]  ibuprofen (ADVIL,MOTRIN) 200 MG tablet Take 200 mg by mouth every 12 (twelve) hours as needed (for pain). Patient not taking: Reported on 05/10/2022 04/25/17   [provider]  KEPPRA 500 MG tablet Take 500 mg by mouth in the morning and at bedtime.    [provider]  levothyroxine (SYNTHROID) 100 MCG tablet Take 100 mcg by mouth daily before breakfast.    [provider]  magnesium hydroxide (MILK OF MAGNESIA) 400 MG/5ML suspension Take 30 mLs by mouth every 12 (twelve) hours as needed for mild constipation.    [provider]  magnesium oxide (MAG-OX) 400 (240 Mg) MG tablet Take 2 tablets (800 mg total) by mouth 2 (two) times daily. 11/12/20   Lanae Boast, MD  midodrine (PROAMATINE) 2.5 MG tablet Take 1 tablet (2.5 mg total) by mouth 3 (three) times daily with meals. 09/10/16   Elgergawy, Leana Roe, MD  Multiple Vitamin (MULTIVITAMIN) tablet Take 1 tablet by mouth daily. 06/13/17   [provider]  NON FORMULARY Take 120 mLs by mouth See admin instructions. MedPass shake- Drink 120 ml's by mouth three times a day    [provider]  omeprazole (PRILOSEC) 20 MG capsule Take 20 mg by mouth in the morning.    [provider]  polyethylene glycol (MIRALAX / GLYCOLAX) packet Take 17 g by mouth See admin instructions. Mix 17 grams of powder into 4-8 ounces of water and drink every morning    [provider]  Probiotic Product (PROBIOTIC PO) Take 1 tablet by mouth daily.    [provider]  PROTEIN PO Take 30 mLs by mouth 2 (two) times daily.    [provider]  rosuvastatin (CRESTOR) 20 MG tablet Take 20 mg by mouth at  bedtime. 10/27/22   [provider]  SANTYL ointment Apply 1 application topically See admin instructions. Apply to right heel topically every day shift- clean with Dakin's, apply Santyl, and cover with normal saline gauze with foam dressing    [provider]  tiZANidine (ZANAFLEX) 2 MG tablet Take 2 mg by mouth 3 (three) times daily.    [provider]  Vitamin D, Ergocalciferol, (DRISDOL) 1.25 MG (50000 UNIT) CAPS capsule Take 50,000 Units by mouth every 7 (seven) days. 04/06/18   [provider]    Physical Exam: Vitals:   12/03/22 1308 12/03/22 1322  BP:  101/77  Pulse:  (!) 113  Resp:  17  Temp:  98 F (36.7 C)  TempSrc:  Rectal  SpO2:  97%  Weight: 45.4 kg   Height: 4\' 11"  (1.499 m)  Physical Exam Vitals and nursing note reviewed.  Constitutional:      General: She is not in acute distress.    Appearance: Normal appearance. She is ill-appearing.  HENT:     Head: Normocephalic.     Nose: No rhinorrhea.     Mouth/Throat:     Mouth: Mucous membranes are moist.  Eyes:     General: No scleral icterus.    Pupils: Pupils are equal, round, and reactive to light.  Cardiovascular:     Rate and Rhythm: Normal rate and regular rhythm.  Pulmonary:     Effort: Pulmonary effort is normal.     Breath sounds: Normal breath sounds.  Abdominal:     General: Bowel sounds are normal.     Palpations: Abdomen is soft.  Musculoskeletal:     Right lower leg: No edema.     Left lower leg: No edema.  Skin:    General: Skin is warm and dry.  Neurological:     Mental Status: She is alert and oriented to person, place, and time. Mental status is at baseline.  Psychiatric:        Behavior: Behavior normal.     Data Reviewed:   Results are pending, will review when available. 11/10/2020 transthoracic echocardiogram. IMPRESSIONS:   1. Left ventricular ejection fraction, by estimation, is 65 to 70%. The  left ventricle has normal function. The left  ventricle has no regional  wall motion abnormalities. Left ventricular diastolic parameters were  normal.   2. Right ventricular systolic function is normal. The right ventricular  size is normal. There is normal pulmonary artery systolic pressure.   3. The mitral valve is abnormal. Trivial mitral valve regurgitation. No  evidence of mitral stenosis.   4. The aortic valve is tricuspid. Aortic valve regurgitation is not  visualized. No aortic stenosis is present.   5. The inferior vena cava is normal in size with greater than 50%  respiratory variability, suggesting right atrial pressure of 3 mmHg.   Comparison(s): No significant change from prior study.   EKG: Vent. rate 114 BPM PR interval 137 ms QRS duration 73 ms QT/QTcB 316/436 ms P-R-T axes 86 8 49 Sinus tachycardia Biatrial enlargement Abnormal R-wave progression, early transition??H  Assessment and Plan: Principal Problem:   Hypotension Per patient, she normally runs low blood pressures. She does not seem to have an active infection. Observation/telemetry. Continue IV fluids. Resume midodrine 2.5 mg p.o. 3 times daily.  Active Problems:   Hyponatremia Continue IV fluids. Follow sodium level in AM.    Hyperkalemia Continue IV fluids. Follow-up potassium level.    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) Continue baclofen 10 mg p.o. 3 times daily.    Protein-calorie malnutrition, severe (HCC) Declined protein supplementation (worsens constipation).    Anemia of chronic disease Monitor hematocrit and hemoglobin. Transfuse as needed.    Hypothyroidism Continue levothyroxine 100 mcg p.o. daily.    Presence of urostomy [facility]) Supportive care.    Seizure (HCC) Continue Keppra 500 mg p.o. twice daily.     Advance Care Planning:   Code Status: Do not attempt resuscitation (DNR) PRE-ARREST INTERVENTIONS DESIRED   Consults:   Family Communication:   Severity of Illness: The appropriate  patient status for this patient is INPATIENT. Inpatient status is judged to be reasonable and necessary in order to provide the required intensity of service to ensure the patient's safety. The patient's presenting symptoms, physical exam findings, and initial radiographic and laboratory data in the  context of their chronic comorbidities is felt to place them at high risk for further clinical deterioration. Furthermore, it is not anticipated that the patient will be medically stable for discharge from the hospital within 2 midnights of admission.   * I certify that at the point of admission it is my clinical judgment that the patient will require inpatient hospital care spanning beyond 2 midnights from the point of admission due to high intensity of service, high risk for further deterioration and high frequency of surveillance required.*  Author: Bobette Mo, MD 12/03/2022 3:52 PM  For on call review [email or web address].   This document was prepared using Dragon voice recognition software and may contain some unintended transcription errors.

## 2022-12-03 NOTE — ED Notes (Signed)
ED TO INPATIENT HANDOFF REPORT  ED Nurse Name and Phone #: Crist Infante, RN [phone]  S Name/Age/Gender Deanna Baldwin 47 y.o. female Room/Bed: WA13/WA13  Code Status   Code Status: Prior  Home/SNF/Other Skilled nursing facility Patient oriented to: self, place, time, and situation Is this baseline? Yes   Triage Complete: Triage complete  Chief Complaint Hypotension [I95.9]  Triage Note Patient brought in by EMS from [facility] for hypotension. Per EMS patient BP was 82/50 she has been treated at facility via transdermal fluid therapy. She has HX of MS, patient has colostomy and catheter.  82/58 108 97.5   Allergies Allergies  Allergen Reactions   Natalizumab Other (See Comments)    Allergy not noted on MAR    Level of Care/Admitting Diagnosis ED Disposition     ED Disposition  Admit   Condition  --   Comment  Hospital Area: [facility] [[id number]]  Level of Care: Telemetry [5]  Admit to tele based on following criteria: Other see comments  Comments: Hypotension and tachycardia  May place patient in observation at [facility] or [facility] if equivalent level of care is available:: No  Covid Evaluation: Asymptomatic - no recent exposure (last 10 days) testing not required  Diagnosis: Hypotension [308657]  Admitting Physician: Bobette Mo [[id number]]  Attending Physician: Bobette Mo [[id number]]          B Medical/Surgery History Past Medical History:  Diagnosis Date   Buttock wound 03/03/2016   Dysrhythmia    tachycardia   Hypercalcemia 10/14/2020   MS (multiple sclerosis) (HCC)    Neutropenia (HCC)    Pneumonia 02/2016   Pressure ulcer, 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   Protein calorie malnutrition (HCC)    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/19/2017   Sepsis (HCC) 07/07/2016   Severe sepsis (HCC) 03/03/2016   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)    UTI (urinary tract infection) 02/2016   Vitamin B 12  deficiency 07/09/2016   Past Surgical History:  Procedure Laterality Date   DIVERTING ILEOSTOMY N/A 09/03/2016   Procedure: ILEAL CONDUIT  URINARY DIVERSION OPEN;  Surgeon: Crist Fat, MD;  Location: [facility];  Service: Urology;  Laterality: N/A;     A IV Location/Drains/Wounds Patient Lines/Drains/Airways Status     Active Line/Drains/Airways     Name Placement date Placement time Site Days   Peripheral IV 12/03/22 20 G 1.88" Anterior;Right;Upper Arm 12/03/22  1415  Arm  less than 1   Peripheral IV 12/03/22 20 G 1.88" Anterior;Right Forearm 12/03/22  1400  Forearm  less than 1   Urostomy Ileal conduit RLQ 09/03/16  --  RLQ  2282   Pressure Injury 08/02/17 Stage IV - Full thickness tissue loss with exposed bone, tendon or muscle. WOC assessment in the ED 08/02/17  0955  -- 1949   Pressure Injury 08/02/17 Stage IV - Full thickness tissue loss with exposed bone, tendon or muscle. WOC nurse assessment in ED 08/02/17  0956  -- 1949   Pressure Injury 08/02/17 Stage IV - Full thickness tissue loss with exposed bone, tendon or muscle. WOC assessment in the ED 08/02/17  1008  -- 1949   Pressure Injury 08/10/17 Deep Tissue Injury - Purple or maroon localized area of discolored intact skin or blood-filled blister due to damage of underlying soft tissue from pressure and/or shear. purple 08/10/17  2200  -- 1941   Pressure Injury Buttocks Right Stage 3 -  Full thickness tissue loss. Subcutaneous fat may  be visible but bone, tendon or muscle are NOT exposed. Right Trochanter --  --  -- --   Pressure Injury Knee Right;Medial Stage 2 -  Partial thickness loss of dermis presenting as a shallow open injury with a red, pink wound bed without slough. Right medial knee --  --  -- --   Pressure Injury 10/21/20 Foot Left;Lateral Unstageable - Full thickness tissue loss in which the base of the injury is covered by slough (yellow, tan, gray, green or brown) and/or eschar (tan, brown or black) in the wound bed.  small hard purle area 10/21/20  2015  -- 773   Pressure Injury 10/22/20 Hip Left Stage 2 -  Partial thickness loss of dermis presenting as a shallow open injury with a red, pink wound bed without slough. redden open 10/22/20  2040  -- 772   Pressure Injury 11/07/20 Heel Left Stage 4 - Full thickness tissue loss with exposed bone, tendon or muscle. 11/07/20  2100  -- 756   Wound / Incision (Open or Dehisced) --  --  --  --   Wound / Incision (Open or Dehisced) 10/21/20 Labia Right;Posterior small black area in middle surrounded by redden area unstageable 10/21/20  2015  Labia  773            Intake/Output Last 24 hours No intake or output data in the 24 hours ending 12/03/22 1620  Labs/Imaging Results for orders placed or performed during the hospital encounter of 12/03/22 (from the past 48 hour(s))  CBG monitoring, ED     Status: Abnormal   Collection Time: 12/03/22  1:09 PM  Result Value Ref Range   Glucose-Capillary 132 (H) 70 - 99 mg/dL    Comment: Glucose reference range applies only to samples taken after fasting for at least 8 hours.  Resp panel by RT-PCR (RSV, Flu A&B, Covid) Anterior Nasal Swab     Status: None   Collection Time: 12/03/22  1:14 PM   Specimen: Anterior Nasal Swab  Result Value Ref Range   SARS Coronavirus 2 by RT PCR NEGATIVE NEGATIVE    Comment: (NOTE) SARS-CoV-2 target nucleic acids are NOT DETECTED.  The SARS-CoV-2 RNA is generally detectable in upper respiratory specimens during the acute phase of infection. The lowest concentration of SARS-CoV-2 viral copies this assay can detect is 138 copies/mL. A negative result does not preclude SARS-Cov-2 infection and should not be used as the sole basis for treatment or other patient management decisions. A negative result may occur with  improper specimen collection/handling, submission of specimen other than nasopharyngeal swab, presence of viral mutation(s) within the areas targeted by this assay, and  inadequate number of viral copies(<138 copies/mL). A negative result must be combined with clinical observations, patient history, and epidemiological information. The expected result is Negative.  Fact Sheet for Patients:  BloggerCourse.com  Fact Sheet for Healthcare Providers:  SeriousBroker.it  This test is no t yet approved or cleared by the [place] FDA and  has been authorized for detection and/or diagnosis of SARS-CoV-2 by FDA under an Emergency Use Authorization (EUA). This EUA will remain  in effect (meaning this test can be used) for the duration of the COVID-19 declaration under Section 564(b)(1) of the Act, 21 U.S.C.section 360bbb-3(b)(1), unless the authorization is terminated  or revoked sooner.       Influenza A by PCR NEGATIVE NEGATIVE   Influenza B by PCR NEGATIVE NEGATIVE    Comment: (NOTE) The Xpert Xpress SARS-CoV-2/FLU/RSV plus assay is intended  as an aid in the diagnosis of influenza from Nasopharyngeal swab specimens and should not be used as a sole basis for treatment. Nasal washings and aspirates are unacceptable for Xpert Xpress SARS-CoV-2/FLU/RSV testing.  Fact Sheet for Patients: BloggerCourse.com  Fact Sheet for Healthcare Providers: SeriousBroker.it  This test is not yet approved or cleared by the [place] FDA and has been authorized for detection and/or diagnosis of SARS-CoV-2 by FDA under an Emergency Use Authorization (EUA). This EUA will remain in effect (meaning this test can be used) for the duration of the COVID-19 declaration under Section 564(b)(1) of the Act, 21 U.S.C. section 360bbb-3(b)(1), unless the authorization is terminated or revoked.     Resp Syncytial Virus by PCR NEGATIVE NEGATIVE    Comment: (NOTE) Fact Sheet for Patients: BloggerCourse.com  Fact Sheet for Healthcare  Providers: SeriousBroker.it  This test is not yet approved or cleared by the [place] FDA and has been authorized for detection and/or diagnosis of SARS-CoV-2 by FDA under an Emergency Use Authorization (EUA). This EUA will remain in effect (meaning this test can be used) for the duration of the COVID-19 declaration under Section 564(b)(1) of the Act, 21 U.S.C. section 360bbb-3(b)(1), unless the authorization is terminated or revoked.  Performed at [facility], [address], [place], [place] [address]   Comprehensive metabolic panel     Status: Abnormal   Collection Time: 12/03/22  2:15 PM  Result Value Ref Range   Sodium 131 (L) 135 - 145 mmol/L   Potassium 5.3 (H) 3.5 - 5.1 mmol/L   Chloride 93 (L) 98 - 111 mmol/L   CO2 25 22 - 32 mmol/L   Glucose, Bld 116 (H) 70 - 99 mg/dL    Comment: Glucose reference range applies only to samples taken after fasting for at least 8 hours.   BUN 61 (H) 6 - 20 mg/dL   Creatinine, Ser 0.98 0.44 - 1.00 mg/dL   Calcium 8.9 8.9 - 11.9 mg/dL   Total Protein 7.7 6.5 - 8.1 g/dL   Albumin 2.3 (L) 3.5 - 5.0 g/dL   AST 23 15 - 41 U/L   ALT 14 0 - 44 U/L   Alkaline Phosphatase 115 38 - 126 U/L   Total Bilirubin 0.5 0.3 - 1.2 mg/dL   GFR, Estimated >14 >78 mL/min    Comment: (NOTE) Calculated using the CKD-EPI Creatinine Equation (2021)    Anion gap 13 5 - 15    Comment: Performed at [facility], [address] [place], [place] [address]  CBC with Differential     Status: Abnormal   Collection Time: 12/03/22  2:15 PM  Result Value Ref Range   WBC 10.6 (H) 4.0 - 10.5 K/uL   RBC 3.50 (L) 3.87 - 5.11 MIL/uL   Hemoglobin 9.8 (L) 12.0 - 15.0 g/dL   HCT 13.0 (L) 86.5 - 78.4 %   MCV 94.9 80.0 - 100.0 fL   MCH 28.0 26.0 - 34.0 pg   MCHC 29.5 (L) 30.0 - 36.0 g/dL   RDW 69.6 29.5 - 28.4 %   Platelets 621 (H) 150 - 400 K/uL   nRBC 0.0 0.0 - 0.2 %   Neutrophils Relative % 80 %    Neutro Abs 8.4 (H) 1.7 - 7.7 K/uL   Lymphocytes Relative 13 %   Lymphs Abs 1.4 0.7 - 4.0 K/uL   Monocytes Relative 6 %   Monocytes Absolute 0.7 0.1 - 1.0 K/uL   Eosinophils Relative 0 %  Eosinophils Absolute 0.0 0.0 - 0.5 K/uL   Basophils Relative 0 %   Basophils Absolute 0.0 0.0 - 0.1 K/uL   Immature Granulocytes 1 %   Abs Immature Granulocytes 0.08 (H) 0.00 - 0.07 K/uL    Comment: Performed at [facility], [address] [place], [place] [address]  Protime-INR     Status: Abnormal   Collection Time: 12/03/22  2:15 PM  Result Value Ref Range   Prothrombin Time 15.4 (H) 11.4 - 15.2 seconds   INR 1.2 0.8 - 1.2    Comment: (NOTE) INR goal varies based on device and disease states. Performed at [facility], [address] [place], [place] [address]   APTT     Status: None   Collection Time: 12/03/22  2:15 PM  Result Value Ref Range   aPTT 26 24 - 36 seconds    Comment: Performed at [facility], [address], [place], [place] [address]  hCG, serum, qualitative     Status: None   Collection Time: 12/03/22  2:15 PM  Result Value Ref Range   Preg, Serum NEGATIVE NEGATIVE    Comment:        THE SENSITIVITY OF THIS METHODOLOGY IS >10 mIU/mL. Performed at [facility], [address], [place], [place] [address]   I-Stat Lactic Acid, ED     Status: None   Collection Time: 12/03/22  2:25 PM  Result Value Ref Range   Lactic Acid, Venous 1.7 0.5 - 1.9 mmol/L   No results found.  Pending Labs Unresulted Labs (From admission, onward)     Start     Ordered   12/03/22 1411  Blood Culture (routine x 2)  (Undifferentiated presentation (screening labs and basic nursing orders))  BLOOD CULTURE X 2,   STAT      12/03/22 1410   12/03/22 1411  Urinalysis, w/ Reflex to Culture (Infection Suspected) -Urine, Clean Catch  (Undifferentiated presentation (screening labs and basic nursing orders))  ONCE - URGENT,    URGENT       Question:  Specimen Source  Answer:  Urine, Clean Catch   12/03/22 1410   12/03/22 1310  Urinalysis, Routine w reflex microscopic -Urine, Catheterized  Once,   URGENT       Question:  Specimen Source  Answer:  Urine, Catheterized   12/03/22 1309   12/03/22 1310  Urine Culture  Once,   URGENT       Question:  Indication  Answer:  Sepsis   12/03/22 1309            Vitals/Pain Today's Vitals   12/03/22 1308 12/03/22 1322  BP:  101/77  Pulse:  (!) 113  Resp:  17  Temp:  98 F (36.7 C)  TempSrc:  Rectal  SpO2:  97%  Weight: 45.4 kg   Height: 4\' 11"  (1.499 m)   PainSc: 0-No pain     Isolation Precautions No active isolations  Medications Medications  lactated ringers infusion ( Intravenous New Bag/Given 12/03/22 1610)  sodium chloride 0.9 % bolus 1,000 mL (1,000 mLs Intravenous New Bag/Given 12/03/22 1616)    Mobility non-ambulatory     Focused Assessments Neuro Assessment Handoff:  Swallow screen pass?  N/A Cardiac Rhythm: Normal sinus rhythm       Neuro Assessment:   Neuro Checks:      Has TPA been given? No If patient is a Neuro Trauma and patient is going to OR before floor  call report to 4N Charge nurse: [phone] or [phone]   R Recommendations: See Admitting Provider Note  Report given to:   Additional Notes:

## 2022-12-03 NOTE — ED Provider Notes (Signed)
[facility] AT [facility] Provider Note   CSN: [id number] Arrival date & time: 12/03/22  1249     History {Add pertinent medical, surgical, social history, OB history to HPI:1} Chief Complaint  Patient presents with   Hypotension    Deanna Baldwin is a 47 y.o. female.  HPI    Pt comes in cc  Home Medications Prior to Admission medications   Medication Sig Start Date End Date Taking? Authorizing Provider  acetaminophen (TYLENOL) 325 MG tablet Take 2 tablets (650 mg total) by mouth every 6 (six) hours as needed for mild pain (or Fever >/= 101). 04/11/17   Rodolph Bong, MD  ascorbic acid (VITAMIN C) 500 MG tablet Take 500 mg by mouth in the morning.    [provider]  aspirin EC 81 MG tablet Take 81 mg by mouth daily. Swallow whole.    [provider]  baclofen (LIORESAL) 10 MG tablet Take 10 mg by mouth 3 (three) times daily.    [provider]  bisacodyl (DULCOLAX) 5 MG EC tablet Take 5 mg by mouth daily.    [provider]  calcitRIOL (ROCALTROL) 0.25 MCG capsule Take 1 capsule (0.25 mcg total) by mouth 2 (two) times daily. 11/12/20   Lanae Boast, MD  calcium carbonate (TUMS - DOSED IN MG ELEMENTAL CALCIUM) 500 MG chewable tablet Chew 3 tablets (600 mg of elemental calcium total) by mouth 4 (four) times daily -  with meals and at bedtime. Patient not taking: Reported on 05/10/2022 11/12/20   Lanae Boast, MD  Cyanocobalamin (VITAMIN B-12 PO) Take 500 mcg by mouth daily.    [provider]  ENSURE (ENSURE) Take 120 mLs by mouth 3 (three) times daily between meals. With meals. Vanilla    [provider]  ferrous sulfate 325 (65 FE) MG tablet Take 325 mg by mouth daily with breakfast.    [provider]  ibuprofen (ADVIL,MOTRIN) 200 MG tablet Take 200 mg by mouth every 12 (twelve) hours as needed (for pain). Patient not taking: Reported on 05/10/2022 04/25/17   [provider]   KEPPRA 500 MG tablet Take 500 mg by mouth in the morning and at bedtime.    [provider]  levothyroxine (SYNTHROID) 100 MCG tablet Take 100 mcg by mouth daily before breakfast.    [provider]  magnesium hydroxide (MILK OF MAGNESIA) 400 MG/5ML suspension Take 30 mLs by mouth every 12 (twelve) hours as needed for mild constipation.    [provider]  magnesium oxide (MAG-OX) 400 (240 Mg) MG tablet Take 2 tablets (800 mg total) by mouth 2 (two) times daily. 11/12/20   Lanae Boast, MD  midodrine (PROAMATINE) 2.5 MG tablet Take 1 tablet (2.5 mg total) by mouth 3 (three) times daily with meals. 09/10/16   Elgergawy, Leana Roe, MD  Multiple Vitamin (MULTIVITAMIN) tablet Take 1 tablet by mouth daily. 06/13/17   [provider]  NON FORMULARY Take 120 mLs by mouth See admin instructions. MedPass shake- Drink 120 ml's by mouth three times a day    [provider]  omeprazole (PRILOSEC) 20 MG capsule Take 20 mg by mouth in the morning.    [provider]  polyethylene glycol (MIRALAX / GLYCOLAX) packet Take 17 g by mouth See admin instructions. Mix 17 grams of powder into 4-8 ounces of water and drink every morning    [provider]  Probiotic Product (PROBIOTIC PO) Take 1 tablet by mouth daily.  [provider]  PROTEIN PO Take 30 mLs by mouth 2 (two) times daily.    [provider]  rosuvastatin (CRESTOR) 20 MG tablet Take 20 mg by mouth at bedtime. 10/27/22   [provider]  SANTYL ointment Apply 1 application topically See admin instructions. Apply to right heel topically every day shift- clean with Dakin's, apply Santyl, and cover with normal saline gauze with foam dressing    [provider]  tiZANidine (ZANAFLEX) 2 MG tablet Take 2 mg by mouth 3 (three) times daily.    [provider]  Vitamin D, Ergocalciferol, (DRISDOL) 1.25 MG (50000 UNIT) CAPS capsule Take 50,000 Units by mouth every 7  (seven) days. 04/06/18   [provider]      Allergies    Natalizumab    Review of Systems   Review of Systems  Physical Exam Updated Vital Signs BP 101/77 (BP Location: Right Arm)   Pulse (!) 113   Temp 98 F (36.7 C) (Rectal)   Resp 17   Ht 4\' 11"  (1.499 m)   Wt 45.4 kg   SpO2 97%   BMI 20.20 kg/m  Physical Exam  ED Results / Procedures / Treatments   Labs (all labs ordered are listed, but only abnormal results are displayed) Labs Reviewed  COMPREHENSIVE METABOLIC PANEL - Abnormal; Notable for the following components:      Result Value   Sodium 131 (*)    Potassium 5.3 (*)    Chloride 93 (*)    Glucose, Bld 116 (*)    BUN 61 (*)    Albumin 2.3 (*)    All other components within normal limits  CBC WITH DIFFERENTIAL/PLATELET - Abnormal; Notable for the following components:   WBC 10.6 (*)    RBC 3.50 (*)    Hemoglobin 9.8 (*)    HCT 33.2 (*)    MCHC 29.5 (*)    Platelets 621 (*)    Neutro Abs 8.4 (*)    Abs Immature Granulocytes 0.08 (*)    All other components within normal limits  PROTIME-INR - Abnormal; Notable for the following components:   Prothrombin Time 15.4 (*)    All other components within normal limits  CBG MONITORING, ED - Abnormal; Notable for the following components:   Glucose-Capillary 132 (*)    All other components within normal limits  RESP PANEL BY RT-PCR (RSV, FLU A&B, COVID)  RVPGX2  URINE CULTURE  CULTURE, BLOOD (ROUTINE X 2)  CULTURE, BLOOD (ROUTINE X 2)  APTT  URINALYSIS, ROUTINE W REFLEX MICROSCOPIC  HCG, SERUM, QUALITATIVE  URINALYSIS, W/ REFLEX TO CULTURE (INFECTION SUSPECTED)  I-STAT CG4 LACTIC ACID, ED  I-STAT CG4 LACTIC ACID, ED    EKG None  Radiology No results found.  Procedures Procedures  {Document cardiac monitor, telemetry assessment procedure when appropriate:1}  Medications Ordered in ED Medications  lactated ringers infusion (has no administration in time range)  sodium chloride 0.9 % bolus  1,000 mL (has no administration in time range)    ED Course/ Medical Decision Making/ A&P   {   Click here for ABCD2, HEART and other calculatorsREFRESH Note before signing :1}                              Medical Decision Making Amount and/or Complexity of Data Reviewed Labs: ordered. Radiology: ordered. ECG/medicine tests: ordered.  Risk Prescription drug management. Decision regarding hospitalization.   ***  {Document  critical care time when appropriate:1} {Document review of labs and clinical decision tools ie heart score, Chads2Vasc2 etc:1}  {Document your independent review of radiology images, and any outside records:1} {Document your discussion with family members, caretakers, and with consultants:1} {Document social determinants of health affecting pt's care:1} {Document your decision making why or why not admission, treatments were needed:1} Final Clinical Impression(s) / ED Diagnoses Final diagnoses:  None    Rx / DC Orders ED Discharge Orders     None

## 2022-12-03 NOTE — ED Triage Notes (Signed)
Patient brought in by EMS from [facility] for hypotension. Per EMS patient BP was 82/50 she has been treated at facility via transdermal fluid therapy. She has HX of MS, patient has colostomy and catheter.  82/58 108 97.5

## 2022-12-04 DIAGNOSIS — L89893 Pressure ulcer of other site, stage 3: Secondary | ICD-10-CM | POA: Diagnosis present

## 2022-12-04 DIAGNOSIS — Z933 Colostomy status: Secondary | ICD-10-CM | POA: Diagnosis not present

## 2022-12-04 DIAGNOSIS — E872 Acidosis, unspecified: Secondary | ICD-10-CM | POA: Diagnosis present

## 2022-12-04 DIAGNOSIS — L89223 Pressure ulcer of left hip, stage 3: Secondary | ICD-10-CM | POA: Diagnosis present

## 2022-12-04 DIAGNOSIS — D638 Anemia in other chronic diseases classified elsewhere: Secondary | ICD-10-CM | POA: Diagnosis present

## 2022-12-04 DIAGNOSIS — E860 Dehydration: Secondary | ICD-10-CM | POA: Diagnosis present

## 2022-12-04 DIAGNOSIS — E875 Hyperkalemia: Secondary | ICD-10-CM | POA: Diagnosis present

## 2022-12-04 DIAGNOSIS — E871 Hypo-osmolality and hyponatremia: Secondary | ICD-10-CM | POA: Diagnosis present

## 2022-12-04 DIAGNOSIS — Z6820 Body mass index (BMI) 20.0-20.9, adult: Secondary | ICD-10-CM | POA: Diagnosis not present

## 2022-12-04 DIAGNOSIS — G40909 Epilepsy, unspecified, not intractable, without status epilepticus: Secondary | ICD-10-CM | POA: Diagnosis present

## 2022-12-04 DIAGNOSIS — E039 Hypothyroidism, unspecified: Secondary | ICD-10-CM | POA: Diagnosis present

## 2022-12-04 DIAGNOSIS — L89213 Pressure ulcer of right hip, stage 3: Secondary | ICD-10-CM | POA: Diagnosis present

## 2022-12-04 DIAGNOSIS — K5909 Other constipation: Secondary | ICD-10-CM | POA: Diagnosis present

## 2022-12-04 DIAGNOSIS — E43 Unspecified severe protein-calorie malnutrition: Secondary | ICD-10-CM | POA: Diagnosis present

## 2022-12-04 DIAGNOSIS — R532 Functional quadriplegia: Secondary | ICD-10-CM | POA: Diagnosis present

## 2022-12-04 DIAGNOSIS — I959 Hypotension, unspecified: Secondary | ICD-10-CM | POA: Diagnosis present

## 2022-12-04 DIAGNOSIS — G8929 Other chronic pain: Secondary | ICD-10-CM | POA: Diagnosis present

## 2022-12-04 DIAGNOSIS — Z936 Other artificial openings of urinary tract status: Secondary | ICD-10-CM | POA: Diagnosis not present

## 2022-12-04 DIAGNOSIS — Z79899 Other long term (current) drug therapy: Secondary | ICD-10-CM | POA: Diagnosis not present

## 2022-12-04 DIAGNOSIS — I10 Essential (primary) hypertension: Secondary | ICD-10-CM | POA: Diagnosis present

## 2022-12-04 DIAGNOSIS — Z66 Do not resuscitate: Secondary | ICD-10-CM | POA: Diagnosis present

## 2022-12-04 DIAGNOSIS — Z1152 Encounter for screening for COVID-19: Secondary | ICD-10-CM | POA: Diagnosis not present

## 2022-12-04 DIAGNOSIS — G35 Multiple sclerosis: Secondary | ICD-10-CM | POA: Diagnosis present

## 2022-12-04 DIAGNOSIS — Z79890 Hormone replacement therapy (postmenopausal): Secondary | ICD-10-CM | POA: Diagnosis not present

## 2022-12-04 LAB — CBC
HCT: 22.7 % — ABNORMAL LOW (ref 36.0–46.0)
Hemoglobin: 6.8 g/dL — CL (ref 12.0–15.0)
MCH: 28.5 pg (ref 26.0–34.0)
MCHC: 30 g/dL (ref 30.0–36.0)
MCV: 95 fL (ref 80.0–100.0)
Platelets: 426 10*3/uL — ABNORMAL HIGH (ref 150–400)
RBC: 2.39 MIL/uL — ABNORMAL LOW (ref 3.87–5.11)
RDW: 15.1 % (ref 11.5–15.5)
WBC: 5.4 10*3/uL (ref 4.0–10.5)
nRBC: 0 % (ref 0.0–0.2)

## 2022-12-04 LAB — COMPREHENSIVE METABOLIC PANEL
ALT: 13 U/L (ref 0–44)
AST: 18 U/L (ref 15–41)
Albumin: 1.7 g/dL — ABNORMAL LOW (ref 3.5–5.0)
Alkaline Phosphatase: 64 U/L (ref 38–126)
Anion gap: 9 (ref 5–15)
BUN: 42 mg/dL — ABNORMAL HIGH (ref 6–20)
CO2: 21 mmol/L — ABNORMAL LOW (ref 22–32)
Calcium: 7.7 mg/dL — ABNORMAL LOW (ref 8.9–10.3)
Chloride: 105 mmol/L (ref 98–111)
Creatinine, Ser: 0.85 mg/dL (ref 0.44–1.00)
GFR, Estimated: >60 mL/min (ref >60)
Glucose, Bld: 118 mg/dL — ABNORMAL HIGH (ref 70–99)
Potassium: 3.7 mmol/L (ref 3.5–5.1)
Sodium: 135 mmol/L (ref 135–145)
Total Bilirubin: 0.2 mg/dL — ABNORMAL LOW (ref 0.3–1.2)
Total Protein: 4.5 g/dL — ABNORMAL LOW (ref 6.5–8.1)

## 2022-12-04 LAB — LACTIC ACID, PLASMA: Lactic Acid, Venous: 2.1 mmol/L (ref 0.5–1.9)

## 2022-12-04 LAB — URINALYSIS, W/ REFLEX TO CULTURE (INFECTION SUSPECTED)
Bilirubin Urine: NEGATIVE
Glucose, UA: NEGATIVE mg/dL
Ketones, ur: NEGATIVE mg/dL
Nitrite: NEGATIVE
Protein, ur: 100 mg/dL — AB
RBC / HPF: >50 RBC/hpf (ref 0–5)
Specific Gravity, Urine: 1.009 (ref 1.005–1.030)
WBC, UA: >50 WBC/hpf (ref 0–5)
pH: 8 (ref 5.0–8.0)

## 2022-12-04 LAB — RETICULOCYTES
Immature Retic Fract: 17 % — ABNORMAL HIGH (ref 2.3–15.9)
RBC.: 2.56 MIL/uL — ABNORMAL LOW (ref 3.87–5.11)
Retic Count, Absolute: 39.7 10*3/uL (ref 19.0–186.0)
Retic Ct Pct: 1.6 % (ref 0.4–3.1)

## 2022-12-04 LAB — HIV ANTIBODY (ROUTINE TESTING W REFLEX): HIV Screen 4th Generation wRfx: NONREACTIVE

## 2022-12-04 LAB — HEMOGLOBIN AND HEMATOCRIT, BLOOD
HCT: 30.1 % — ABNORMAL LOW (ref 36.0–46.0)
Hemoglobin: 9.3 g/dL — ABNORMAL LOW (ref 12.0–15.0)

## 2022-12-04 LAB — C DIFFICILE QUICK SCREEN W PCR REFLEX
C Diff antigen: NEGATIVE
C Diff interpretation: NOT DETECTED
C Diff toxin: NEGATIVE

## 2022-12-04 LAB — IRON AND TIBC
Iron: 24 ug/dL — ABNORMAL LOW (ref 28–170)
Saturation Ratios: 14 % (ref 10.4–31.8)
TIBC: 169 ug/dL — ABNORMAL LOW (ref 250–450)
UIBC: 145 ug/dL

## 2022-12-04 LAB — TSH: TSH: 0.012 u[IU]/mL — ABNORMAL LOW (ref 0.350–4.500)

## 2022-12-04 LAB — FERRITIN: Ferritin: 121 ng/mL (ref 11–307)

## 2022-12-04 LAB — OCCULT BLOOD X 1 CARD TO LAB, STOOL: Fecal Occult Bld: NEGATIVE

## 2022-12-04 LAB — FOLATE: Folate: 7 ng/mL (ref >5.9)

## 2022-12-04 LAB — VITAMIN B12: Vitamin B-12: 1969 pg/mL — ABNORMAL HIGH (ref 180–914)

## 2022-12-04 LAB — PREPARE RBC (CROSSMATCH)

## 2022-12-04 LAB — CORTISOL: Cortisol, Plasma: 19.6 ug/dL

## 2022-12-04 MED ORDER — ORAL CARE MOUTH RINSE: 15.0000 mL | OROMUCOSAL | Status: DC | PRN

## 2022-12-04 MED ORDER — MIDODRINE HCL 5 MG PO TABS
10.0000 mg | ORAL_TABLET | Freq: Three times a day (TID) | ORAL | Status: DC
  Administered 2022-12-04 – 2022-12-07 (×11): 10 mg via ORAL
  Filled 2022-12-04 (×11): qty 2

## 2022-12-04 MED ORDER — LACTATED RINGERS IV BOLUS
1000.0000 mL | Freq: Once | INTRAVENOUS | Status: AC
  Administered 2022-12-04: 1000 mL via INTRAVENOUS

## 2022-12-04 MED ORDER — MIDODRINE HCL 5 MG PO TABS
5.0000 mg | ORAL_TABLET | Freq: Once | ORAL | Status: AC
  Administered 2022-12-04: 5 mg via ORAL
  Filled 2022-12-04: qty 1

## 2022-12-04 MED ORDER — SODIUM CHLORIDE 0.9% IV SOLUTION
Freq: Once | INTRAVENOUS | Status: AC
Start: 2022-12-04 — End: 2022-12-04

## 2022-12-04 MED ORDER — ORAL CARE MOUTH RINSE
15.0000 mL | OROMUCOSAL | Status: DC
  Administered 2022-12-04 – 2022-12-07 (×14): 15 mL via OROMUCOSAL

## 2022-12-04 MED ORDER — ALBUMIN HUMAN 25 % IV SOLN
25.0000 g | Freq: Once | INTRAVENOUS | Status: AC
  Administered 2022-12-04: 25 g via INTRAVENOUS
  Filled 2022-12-04: qty 100

## 2022-12-04 NOTE — Progress Notes (Signed)
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  Per EMS his BP was 82/50. In the ED: Blood pressure has been in 70s to 80s systolic. white count 10.6, hemoglobin 9.8 g/dL and platelets 811. PT 15.4, INR 1.2 and PTT 26. Lactic acid was normal. Negative coronavirus, influenza and RSV PCR. CMP showed a sodium 131, potassium 5.3, chloride 93 and CO2 25 mmol/L. Glucose 116, BUN 61 and creatinine 0.87 mg/dL. 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 She is on midodrine 2.5 mg 3 times daily.  Recent echo 11/10/2020 EF 65 to 70%.  11/1 received 1.5 L bolus NS.  Early morning received 1 L RL and 1125 g.  Increase midodrine from 2.5 to 10 mg 3 times daily.  No obvious infection noticed UA  does appear abnormal with RBC more than 50 WBC more than 50, many bacteria with large LE, follow-up urine and blood culture.  Check cortisol, TSH.  Manage with IVF/albumin as needed.  Mild lactic acidosis: Likely from hypertension.  Peaked up to 2.2 overnight now down to 2.1.  Monitor  Acute on chronic anemia of chronic disease Hemoglobin dropped to 6.8 g this is after extensive multiple IV fluid boluses.  Patient has no active bleeding her stool is brown.She has had similar significant drop in hemoglobin in 2022 needing 1 unit blood transfusion.  Her BUN is 42 down from 61-?  Question if she had some hemoconcentrated blood on admit.  Ordered anemia panel and Hemoccult.  She is agreeable for transfusion after explaining risk benefits and ordered. Recent Labs  Lab 12/03/22 1415 12/04/22 0940  HGB 9.8* 6.8*  HCT 33.2* 22.7*    Type 6/7 stool noted: send for C. 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  Hyponatremia Resolve d w/ ivf   Hyperkalemia Resolved   Protein-calorie malnutrition, severe  Declined protein supplementation (worsens constipation).  Chronic constipation: Continue laxatives as needed   Hypothyroidism Continue levothyroxine 100 mcg p.o. daily.   Presence of urostomy Supportive care.   Seizure disorder: Continue Keppra 500 mg p.o. twice daily.  Goals  of care: Patient has had multiple family medically and palliative care evaluation on previous admission, she is DNR/DNI no artificial feeding MOSTVanco form completed in 2022. Called Aunt no answer, called and updated  her sister and updated and all questions answered  DVT prophylaxis: enoxaparin (LOVENOX) injection 30 mg Start: 12/03/22 2200 change to scd for now due to low hb Code Status:   Code Status: Do not attempt resuscitation (DNR) PRE-ARREST INTERVENTIONS DESIRED Family Communication: plan of care discussed with patient at bedside. Patient status is:  admitted as observation but remains hospitalized for ongoing  because of hypotension Level of care: Telemetry   Dispo: The patient is from: NH            Anticipated disposition: NH 1-2 days  Objective: Vitals last 24 hrs: Vitals:   12/04/22 0251 12/04/22 0410 12/04/22 0805 12/04/22 0835  BP: (!) 84/47 (!) 85/52 (!) 82/48 (!) 84/47  Pulse: (!) 102 100 (!) 102 98  Resp:  16  (!) 22  Temp:  98.5 F (36.9 C) 98.7 F (37.1 C) 99.5 F (37.5 C)  TempSrc:    Oral  SpO2:  100% 98% 96%  Weight:      Height:       Weight change:   Physical Examination: General exam: alert awake, ill and frail appearing, older than stated age HEENT:Oral mucosa moist, Ear/Nose WNL grossly Respiratory system: bilaterally clear BS, no use of accessory muscle Cardiovascular system: S1 & S2 +, No JVD. Gastrointestinal system: Abdomen soft,NT, urostomy in place with clear urine, BS+ Nervous System:Alert, awake, moving extremities. Extremities: LE edema neg, contractures in upper and lower extremities  Skin: No rashes,no icterus. MSK: Normal muscle bulk,tone, power  Medications reviewed:  Scheduled Meds:  sodium chloride   Intravenous Once   baclofen  10 mg Oral TID   calcitRIOL  0.25 mcg Oral BID   enoxaparin (LOVENOX) injection  30 mg Subcutaneous Q24H   levETIRAcetam  500 mg Oral BID   levothyroxine  100 mcg Oral QAC breakfast   magnesium oxide  800 mg Oral BID   midodrine  10 mg Oral TID WC   mouth rinse  15 mL Mouth Rinse 4 times per day   pantoprazole  40 mg Oral Daily   rosuvastatin  20 mg Oral QHS   Continuous Infusions:  Diet Order              Diet regular Fluid consistency: Thin  Diet effective now                  Intake/Output Summary (Last 24 hours) at 12/04/2022 1120 Last data filed at 12/04/2022 0840 Gross per 24 hour  Intake 50 ml  Output 925 ml  Net -875 ml   Net IO Since Admission: -875 mL [12/04/22 1120]  Wt Readings from Last 3 Encounters:  12/03/22 45.4 kg  11/12/20 47.6 kg  10/24/20 42.7 kg     Unresulted Labs (From admission, onward)     Start     Ordered   12/05/22 0500  Basic metabolic panel  Daily,   R      12/04/22 0802   12/05/22 0500  CBC  Daily,   R      12/04/22 0802   12/04/22 1020  Prepare RBC (crossmatch)  (Blood Administration Adult)  ONCE - STAT,   STAT       Question Answer Comment  # of Units 1 unit   Transfusion Indications Hemoglobin < 7 gm/dL and symptomatic  Number of Units to Keep Ahead NO units ahead   If emergent release call blood bank Not emergent release      12/04/22 1019   12/04/22 1019  Type and screen [facility]  (Blood Administration Adult)  ONCE - STAT,   STAT       Comments: [facility]    12/04/22 1019   12/04/22 0802  Cortisol  Add-on,   AD        12/04/22 0802   12/04/22 0500  HIV Antibody (routine testing w rflx)  Once,   R        12/04/22 0500   12/03/22 1310  Urine Culture  Once,   URGENT       Question:  Indication  Answer:  Sepsis   12/03/22 1309          Data Reviewed: I have personally reviewed following labs and imaging studies CBC: Recent Labs  Lab 12/03/22 1415 12/04/22 0940  WBC 10.6* 5.4  NEUTROABS 8.4*  --   HGB 9.8* 6.8*  HCT 33.2* 22.7*  MCV 94.9 95.0  PLT 621* 426*   Basic Metabolic Panel: Recent Labs  Lab 12/03/22 1415 12/04/22 0611  NA 131* 135  K 5.3* 3.7  CL 93* 105  CO2 25 21*  GLUCOSE 116* 118*  BUN 61* 42*  CREATININE 0.87 0.85  CALCIUM 8.9 7.7*   GFR: Estimated Creatinine Clearance: 56.4 mL/min (by C-G formula based on SCr of 0.85 mg/dL). Liver Function  Tests: Recent Labs  Lab 12/03/22 1415 12/04/22 0611  AST 23 18  ALT 14 13  ALKPHOS 115 64  BILITOT 0.5 0.2*  PROT 7.7 4.5*  ALBUMIN 2.3* 1.7*   No results for input(s): "LIPASE", "AMYLASE" in the last 168 hours. No results for input(s): "AMMONIA" in the last 168 hours. Coagulation Profile: Recent Labs  Lab 12/03/22 1415  INR 1.2   BNP (last 3 results) No results for input(s): "PROBNP" in the last 8760 hours. HbA1C: No results for input(s): "HGBA1C" in the last 72 hours. CBG: Recent Labs  Lab 12/03/22 1309  GLUCAP 132*   Lipid Profile: No results for input(s): "CHOL", "HDL", "LDLCALC", "TRIG", "CHOLHDL", "LDLDIRECT" in the last 72 hours. Thyroid Function Tests: Recent Labs    12/04/22 0940  TSH 0.012*   Sepsis Labs: Recent Labs  Lab 12/03/22 1425 12/03/22 1626 12/03/22 1855 12/04/22 0611  LATICACIDVEN 1.7 2.0* 2.2* 2.1*    Recent Results (from the past 240 hour(s))  Resp panel by RT-PCR (RSV, Flu A&B, Covid) Anterior Nasal Swab     Status: None   Collection Time: 12/03/22  1:14 PM   Specimen: Anterior Nasal Swab  Result Value Ref Range Status   SARS Coronavirus 2 by RT PCR NEGATIVE NEGATIVE Final    Comment: (NOTE) SARS-CoV-2 target nucleic acids are NOT DETECTED.  The SARS-CoV-2 RNA is generally detectable in upper respiratory specimens during the acute phase of infection. The lowest concentration of SARS-CoV-2 viral copies this assay can detect is 138 copies/mL. A negative result does not preclude SARS-Cov-2 infection and should not be used as the sole basis for treatment or other patient management decisions. A negative result may occur with  improper specimen collection/handling, submission of specimen other than nasopharyngeal swab, presence of viral mutation(s) within the areas targeted by this assay, and inadequate number of viral copies(<138 copies/mL). A negative result must be combined with clinical observations, patient history, and  epidemiological information. The expected result is Negative.  Fact Sheet for Patients:  BloggerCourse.com  Fact Sheet for Healthcare Providers:  SeriousBroker.it  This test is no t yet approved or cleared by the [place] FDA and  has been authorized for detection and/or diagnosis of SARS-CoV-2 by FDA under an Emergency Use Authorization (EUA). This EUA will remain  in effect (meaning this test can be used) for the duration of the COVID-19 declaration under Section 564(b)(1) of the Act, 21 U.S.C.section 360bbb-3(b)(1), unless the authorization is terminated  or revoked sooner.       Influenza A by PCR NEGATIVE NEGATIVE Final   Influenza B by PCR NEGATIVE NEGATIVE Final    Comment: (NOTE) The Xpert Xpress SARS-CoV-2/FLU/RSV plus assay is intended as an aid in the diagnosis of influenza from Nasopharyngeal swab specimens and should not be used as a sole basis for treatment. Nasal washings and aspirates are unacceptable for Xpert Xpress SARS-CoV-2/FLU/RSV testing.  Fact Sheet for Patients: BloggerCourse.com  Fact Sheet for Healthcare Providers: SeriousBroker.it  This test is not yet approved or cleared by the [place] FDA and has been authorized for detection and/or diagnosis of SARS-CoV-2 by FDA under an Emergency Use Authorization (EUA). This EUA will remain in effect (meaning this test can be used) for the duration of the COVID-19 declaration under Section 564(b)(1) of the Act, 21 U.S.C. section 360bbb-3(b)(1), unless the authorization is terminated or revoked.     Resp Syncytial Virus by PCR NEGATIVE NEGATIVE Final    Comment: (NOTE) Fact Sheet for Patients: BloggerCourse.com  Fact Sheet for Healthcare Providers: SeriousBroker.it  This test is not yet approved or cleared by the [place] FDA and has been  authorized for detection and/or diagnosis of SARS-CoV-2 by FDA under an Emergency Use Authorization (EUA). This EUA will remain in effect (meaning this test can be used) for the duration of the COVID-19 declaration under Section 564(b)(1) of the Act, 21 U.S.C. section 360bbb-3(b)(1), unless the authorization is terminated or revoked.  Performed at [facility], [address], [place], [place] [address]   Blood Culture (routine x 2)     Status: None (Preliminary result)   Collection Time: 12/03/22  2:14 PM   Specimen: Right Antecubital; Blood  Result Value Ref Range Status   Specimen Description   Final    RIGHT ANTECUBITAL Performed at [facility], [address], [place], [place] [address]    Special Requests   Final    BOTTLES DRAWN AEROBIC AND ANAEROBIC Blood Culture adequate volume Performed at [facility], [address], [place], [place] [address]    Culture   Final    NO GROWTH < 24 HOURS Performed at [facility], [address], [place], [place] [address]    Report Status PENDING  Incomplete  Blood Culture (routine x 2)     Status: None (Preliminary result)   Collection Time: 12/03/22  2:15 PM   Specimen: BLOOD  Result Value Ref Range Status   Specimen Description   Final    BLOOD SITE NOT SPECIFIED Performed at [facility], [address] [place], [place] [address]    Special Requests   Final    BOTTLES DRAWN AEROBIC ONLY Blood Culture adequate volume Performed at [facility], [address], [place], [place] [address]    Culture   Final    NO GROWTH < 12 HOURS Performed at [facility], [address], [place], [place] [address]    Report  Status PENDING  Incomplete    Antimicrobials: Anti-infectives (From admission, onward)    None      Culture/Microbiology    Component Value Date/Time   SDES  12/03/2022 1415    BLOOD SITE NOT  SPECIFIED Performed at [facility], [address], [place], [place] [address]    SPECREQUEST  12/03/2022 1415    BOTTLES DRAWN AEROBIC ONLY Blood Culture adequate volume Performed at [facility], [address] [place], [place] [address]    CULT  12/03/2022 1415    NO GROWTH < 12 HOURS Performed at [facility], [address] [place], [place] [address]    REPTSTATUS PENDING 12/03/2022 1415     Radiology Studies: [facility] Chest Port 1 View  Result Date: 12/03/2022 CLINICAL DATA:  Question of sepsis to evaluate for abnormality. Hypotension. EXAM: PORTABLE CHEST 1 VIEW COMPARISON:  10/17/2020 FINDINGS: Heart size and pulmonary vascularity are normal. Lungs are clear. No pleural effusions. No pneumothorax. Mediastinal contours appear intact. IMPRESSION: No active disease. Electronically Signed   By: Burman Nieves M.D.   On: 12/03/2022 17:25     LOS: 0 days   Lanae Boast, MD [facility]  12/04/2022, 11:20 AM

## 2022-12-04 NOTE — Plan of Care (Signed)

## 2022-12-04 NOTE — Hospital Course (Addendum)
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  Per EMS his BP was 82/50. In the ED: Blood pressure has been in 70s to 80s systolic. white count 10.6, hemoglobin 9.8 g/dL and platelets 010. PT 15.4, INR 1.2 and PTT 26. Lactic acid was normal. Negative coronavirus, influenza and RSV PCR. CMP showed a sodium 131, potassium 5.3, chloride 93 and CO2 25 mmol/L. Glucose 116, BUN 61 and creatinine 0.87 mg/dL. Her LFTs were normal with EF session with an albumin level 2.3 g/dL  Chest x-ray no active disease Blood culture urine culture ordered Patient was given multiple fluid boluses and admitted

## 2022-12-04 NOTE — Progress Notes (Signed)
Telephone consent for the blood transfusion was received from her legal guardian. This was witnessed by Victorino Dike, Charity fundraiser.

## 2022-12-04 NOTE — Plan of Care (Signed)
The patient received a unit of PRBC for a low hemoglobin and continued hypotension. She tolerated the transfusion well and without difficulty, and her blood pressure has slightly improved. Her other VS have remained stable. Fall precautions in place. Will continue to monitor.  Problem: Education: Goal: Knowledge of General Education information will improve Description: Including pain rating scale, medication(s)/side effects and non-pharmacologic comfort measures Outcome: Progressing   Problem: Health Behavior/Discharge Planning: Goal: Ability to manage health-related needs will improve Outcome: Progressing   Problem: Clinical Measurements: Goal: Ability to maintain clinical measurements within normal limits will improve Outcome: Progressing Goal: Will remain free from infection Outcome: Progressing Goal: Diagnostic test results will improve Outcome: Progressing Goal: Respiratory complications will improve Outcome: Progressing Goal: Cardiovascular complication will be avoided Outcome: Progressing   Problem: Activity: Goal: Risk for activity intolerance will decrease Outcome: Progressing   Problem: Nutrition: Goal: Adequate nutrition will be maintained Outcome: Progressing   Problem: Coping: Goal: Level of anxiety will decrease Outcome: Progressing   Problem: Elimination: Goal: Will not experience complications related to bowel motility Outcome: Progressing Goal: Will not experience complications related to urinary retention Outcome: Progressing   Problem: Pain Management: Goal: General experience of comfort will improve Outcome: Progressing   Problem: Safety: Goal: Ability to remain free from injury will improve Outcome: Progressing   Problem: Skin Integrity: Goal: Risk for impaired skin integrity will decrease Outcome: Progressing

## 2022-12-04 NOTE — Progress Notes (Signed)
    Patient Name: Deanna Baldwin           DOB: 1975/09/09  MRN: [id number]      Admission Date: 12/03/2022  Attending Provider: Bobette Mo, MD  Primary Diagnosis: Hypotension   Level of care: Telemetry    CROSS COVER NOTE   Date of Service   12/04/2022   Deanna Baldwin, 47 y.o. female, was admitted on 12/03/2022 for Hypotension.    HPI/Events of Note   Hypotensive PMH of chronic tachycardia and chronic hypotension on midodrine tid BP at facility [facility]) was 82/50.  Current BP is 82/54.  She received 1.5 L fluid in the ED. Patient remains asymptomatic.  No acute changes reported.  A/O x 4.  Afebrile, WBC 10.6.  Chest x-ray negative for active disease.  UA has not been collected yet.  Lactic acid 1.7--> now 2.2. No signs of bleeding reported.  Last hemoglobin 9.8.   Addendum- 0045- No improvement after 1 L LR bolus.  Patient is unsure what her usual BP number is, however she does endorse that it is "low."  Previous [facility] records showing SBP 80-100 while on midodrine.  Order placed for IV albumin and additional 5 mg midodrine.   0102- Additional fluid ordered given poor U/O. Strict I/O.    Interventions/ Plan   Fluid bolus, 2 L IV albumin Midodrine, 5 mg        Anthoney Harada, DNP, [facility] [employer]

## 2022-12-05 DIAGNOSIS — I959 Hypotension, unspecified: Secondary | ICD-10-CM | POA: Diagnosis not present

## 2022-12-05 LAB — BASIC METABOLIC PANEL
Anion gap: 7 (ref 5–15)
BUN: 36 mg/dL — ABNORMAL HIGH (ref 6–20)
CO2: 22 mmol/L (ref 22–32)
Calcium: 8.4 mg/dL — ABNORMAL LOW (ref 8.9–10.3)
Chloride: 114 mmol/L — ABNORMAL HIGH (ref 98–111)
Creatinine, Ser: 0.74 mg/dL (ref 0.44–1.00)
GFR, Estimated: >60 mL/min (ref >60)
Glucose, Bld: 149 mg/dL — ABNORMAL HIGH (ref 70–99)
Potassium: 3.8 mmol/L (ref 3.5–5.1)
Sodium: 143 mmol/L (ref 135–145)

## 2022-12-05 LAB — LACTIC ACID, PLASMA
Lactic Acid, Venous: 2.7 mmol/L (ref 0.5–1.9)
Lactic Acid, Venous: 3.4 mmol/L (ref 0.5–1.9)

## 2022-12-05 LAB — TYPE AND SCREEN
ABO/RH(D): AB POS
Antibody Screen: NEGATIVE
Unit division: 0

## 2022-12-05 LAB — CBC
HCT: 28.8 % — ABNORMAL LOW (ref 36.0–46.0)
Hemoglobin: 9 g/dL — ABNORMAL LOW (ref 12.0–15.0)
MCH: 28.2 pg (ref 26.0–34.0)
MCHC: 31.3 g/dL (ref 30.0–36.0)
MCV: 90.3 fL (ref 80.0–100.0)
Platelets: 335 10*3/uL (ref 150–400)
RBC: 3.19 MIL/uL — ABNORMAL LOW (ref 3.87–5.11)
RDW: 15.2 % (ref 11.5–15.5)
WBC: 5.3 10*3/uL (ref 4.0–10.5)
nRBC: 0 % (ref 0.0–0.2)

## 2022-12-05 LAB — BPAM RBC
Blood Product Expiration Date: 202411292359
ISSUE DATE / TIME: 202411021334
Unit Type and Rh: 6200

## 2022-12-05 MED ORDER — SODIUM CHLORIDE 0.9 % IV BOLUS
250.0000 mL | Freq: Once | INTRAVENOUS | Status: AC
  Administered 2022-12-05: 250 mL via INTRAVENOUS

## 2022-12-05 MED ORDER — SODIUM CHLORIDE 0.9 % IV SOLN: INTRAVENOUS | Status: DC

## 2022-12-05 NOTE — Plan of Care (Signed)
Consult with WOC nurse- Low air loss bed being ordered for patient.   Problem: Health Behavior/Discharge Planning: Goal: Ability to manage health-related needs will improve Outcome: Progressing   Problem: Clinical Measurements: Goal: Ability to maintain clinical measurements within normal limits will improve Outcome: Progressing Goal: Will remain free from infection Outcome: Progressing   Problem: Skin Integrity: Goal: Risk for impaired skin integrity will decrease Outcome: Not Progressing

## 2022-12-05 NOTE — Progress Notes (Signed)
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  Per EMS his BP was 82/50. In the ED: Blood pressure has been in 70s to 80s systolic. white count 10.6, hemoglobin 9.8 g/dL and platelets 413. PT 15.4, INR 1.2 and PTT 26. Lactic acid was normal. Negative coronavirus, influenza and RSV PCR. CMP showed a sodium 131, potassium 5.3, chloride 93 and CO2 25 mmol/L. Glucose 116, BUN 61 and creatinine 0.87 mg/dL. Her LFTs were normal with EF session with an albumin level 2.3 g/dL  Chest x-ray no active disease Blood culture urine culture ordered Patient was given multiple fluid boluses and admitted     Subjective: Patient seen and examined.  She has no complaints.  She is fully alert and oriented.   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 She was on midodrine 2.5 mg 3 times daily.  Recent echo 11/10/2020 EF 65 to 70%.  11/1 received 1.5 L bolus NS.  Early morning 11-24, she received 1 L RL and 1125 g.  Increase midodrine from 2.5 to 10 mg 3 times daily.  No obvious infection noticed UA does appear abnormal with RBC more than 50 WBC more than 50, many bacteria with large LE, follow-up urine and blood culture.   Cortisol and TSH within normal limits.  Blood pressure improving but still hypotensive, will give another 1 L of IV fluid over 10 hours.  Mild lactic acidosis: Likely from hypertension.  Peaked up to 2.2 overnight now down to 2.1 yesterday, rechecking today.  Acute on chronic anemia of chronic disease Hemoglobin dropped to 6.8 g after extensive multiple IV fluid boluses.  No signs of bleeding, denied melena.  She received 1 unit of PRBC transfusion and hemoglobin has improved from 9.  I doubt the authenticity of results of the CBC with such a great improvement in hemoglobin with 1 unit of PRBCs transfusion.  Monitor with CBC daily.   Recent Labs  Lab 12/03/22 1415 12/04/22 0940 12/04/22 1817 12/05/22 0615  HGB 9.8* 6.8* 9.3* 9.0*  HCT 33.2* 22.7* 30.1* 28.8*    Type 6/7 stool noted: C. difficile ruled out.  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  Hyponatremia Resolve d w/ ivf   Hyperkalemia Resolved   Protein-calorie malnutrition, severe  Declined protein supplementation (worsens constipation).  Chronic constipation: Continue laxatives as needed   Hypothyroidism Continue levothyroxine 100 mcg p.o. daily.   Presence of urostomy Supportive care.   Seizure disorder: Continue Keppra 500 mg p.o. twice daily.  Goals of care: Patient has  had multiple family medically and palliative care evaluation on previous admission, she is DNR/DNI no artificial feeding MOSTVanco form completed in 2022.  DVT prophylaxis: Place and maintain sequential compression device Start: 12/04/22 1132  Code Status:   Code Status: Do not  attempt resuscitation (DNR) PRE-ARREST INTERVENTIONS DESIRED Family Communication: plan of care discussed with patient at bedside. Patient status is:  admitted as observation but remains hospitalized for ongoing  because of hypotension Level of care: Telemetry   Dispo: The patient is from: NH            Anticipated disposition: NH 1-2 days  Objective: Vitals last 24 hrs: Vitals:   12/04/22 1400 12/04/22 1555 12/04/22 2134 12/05/22 0408  BP: (!) 90/54 (!) 95/53 (!) 97/57 (!) 96/54  Pulse: 94 78 91 (!) 103  Resp:  18 16 18   Temp: 98.5 F (36.9 C) 98.7 F (37.1 C) 97.8 F (36.6 C) 98.3 F (36.8 C)  TempSrc: Oral Oral Oral Oral  SpO2: 97% 96% 93% 96%  Weight:      Height:       Weight change:   Physical Examination: General exam: Appears calm and comfortable  Respiratory system: Clear to auscultation. Respiratory effort normal. Cardiovascular system: S1 & S2 heard, RRR. No JVD, murmurs, rubs, gallops or clicks. No pedal edema. Gastrointestinal system: Abdomen is nondistended, soft and nontender. No organomegaly or masses felt. Normal bowel sounds heard. Central nervous system: Alert and oriented.  Spastic quadriplegia with no movement in left upper extremity and bilateral lower extremity. Extremities: Symmetric 5 x 5 power.   Medications reviewed:  Scheduled Meds:  baclofen  10 mg Oral TID   calcitRIOL  0.25 mcg Oral BID   levETIRAcetam  500 mg Oral BID   levothyroxine  100 mcg Oral QAC breakfast   magnesium oxide  800 mg Oral BID   midodrine  10 mg Oral TID WC   mouth rinse  15 mL Mouth Rinse 4 times per day   pantoprazole  40 mg Oral Daily   rosuvastatin  20 mg Oral QHS   Continuous Infusions:  sodium chloride      Diet Order             Diet regular Fluid consistency: Thin  Diet effective now                  Intake/Output Summary (Last 24 hours) at 12/05/2022 0926 Last data filed at 12/05/2022 0404 Gross per 24 hour  Intake 575 ml  Output 1200 ml  Net  -625 ml   Net IO Since Admission: -1,500 mL [12/05/22 0926]  Wt Readings from Last 3 Encounters:  12/03/22 45.4 kg  11/12/20 47.6 kg  10/24/20 42.7 kg     Unresulted Labs (From admission, onward)     Start     Ordered   12/05/22 0912  Lactic acid, plasma  (Lactic Acid)  Once,   R        12/05/22 0911   12/05/22 0500  Basic metabolic panel  Daily,   R      12/04/22 0802   12/05/22 0500  CBC  Daily,   R      12/04/22 0802   12/03/22 1310  Urine Culture  Once,   URGENT       Question:  Indication  Answer:  Sepsis   12/03/22 1309          Data Reviewed: I have personally reviewed following labs and imaging studies CBC: Recent  Labs  Lab 12/03/22 1415 12/04/22 0940 12/04/22 1817 12/05/22 0615  WBC 10.6* 5.4  --  5.3  NEUTROABS 8.4*  --   --   --   HGB 9.8* 6.8* 9.3* 9.0*  HCT 33.2* 22.7* 30.1* 28.8*  MCV 94.9 95.0  --  90.3  PLT 621* 426*  --  335   Basic Metabolic Panel: Recent Labs  Lab 12/03/22 1415 12/04/22 0611 12/05/22 0615  NA 131* 135 143  K 5.3* 3.7 3.8  CL 93* 105 114*  CO2 25 21* 22  GLUCOSE 116* 118* 149*  BUN 61* 42* 36*  CREATININE 0.87 0.85 0.74  CALCIUM 8.9 7.7* 8.4*   GFR: Estimated Creatinine Clearance: 59.9 mL/min (by C-G formula based on SCr of 0.74 mg/dL). Liver Function Tests: Recent Labs  Lab 12/03/22 1415 12/04/22 0611  AST 23 18  ALT 14 13  ALKPHOS 115 64  BILITOT 0.5 0.2*  PROT 7.7 4.5*  ALBUMIN 2.3* 1.7*   No results for input(s): "LIPASE", "AMYLASE" in the last 168 hours. No results for input(s): "AMMONIA" in the last 168 hours. Coagulation Profile: Recent Labs  Lab 12/03/22 1415  INR 1.2   BNP (last 3 results) No results for input(s): "PROBNP" in the last 8760 hours. HbA1C: No results for input(s): "HGBA1C" in the last 72 hours. CBG: Recent Labs  Lab 12/03/22 1309  GLUCAP 132*   Lipid Profile: No results for input(s): "CHOL", "HDL", "LDLCALC", "TRIG", "CHOLHDL", "LDLDIRECT" in the last 72 hours. Thyroid  Function Tests: Recent Labs    12/04/22 0940  TSH 0.012*   Sepsis Labs: Recent Labs  Lab 12/03/22 1425 12/03/22 1626 12/03/22 1855 12/04/22 0611  LATICACIDVEN 1.7 2.0* 2.2* 2.1*    Recent Results (from the past 240 hour(s))  Resp panel by RT-PCR (RSV, Flu A&B, Covid) Anterior Nasal Swab     Status: None   Collection Time: 12/03/22  1:14 PM   Specimen: Anterior Nasal Swab  Result Value Ref Range Status   SARS Coronavirus 2 by RT PCR NEGATIVE NEGATIVE Final    Comment: (NOTE) SARS-CoV-2 target nucleic acids are NOT DETECTED.  The SARS-CoV-2 RNA is generally detectable in upper respiratory specimens during the acute phase of infection. The lowest concentration of SARS-CoV-2 viral copies this assay can detect is 138 copies/mL. A negative result does not preclude SARS-Cov-2 infection and should not be used as the sole basis for treatment or other patient management decisions. A negative result may occur with  improper specimen collection/handling, submission of specimen other than nasopharyngeal swab, presence of viral mutation(s) within the areas targeted by this assay, and inadequate number of viral copies(<138 copies/mL). A negative result must be combined with clinical observations, patient history, and epidemiological information. The expected result is Negative.  Fact Sheet for Patients:  BloggerCourse.com  Fact Sheet for Healthcare Providers:  SeriousBroker.it  This test is no t yet approved or cleared by the [place] FDA and  has been authorized for detection and/or diagnosis of SARS-CoV-2 by FDA under an Emergency Use Authorization (EUA). This EUA will remain  in effect (meaning this test can be used) for the duration of the COVID-19 declaration under Section 564(b)(1) of the Act, 21 U.S.C.section 360bbb-3(b)(1), unless the authorization is terminated  or revoked sooner.       Influenza A by PCR NEGATIVE  NEGATIVE Final   Influenza B by PCR NEGATIVE NEGATIVE Final    Comment: (NOTE) The Xpert Xpress SARS-CoV-2/FLU/RSV plus assay is intended as an aid in the  diagnosis of influenza from Nasopharyngeal swab specimens and should not be used as a sole basis for treatment. Nasal washings and aspirates are unacceptable for Xpert Xpress SARS-CoV-2/FLU/RSV testing.  Fact Sheet for Patients: BloggerCourse.com  Fact Sheet for Healthcare Providers: SeriousBroker.it  This test is not yet approved or cleared by the [place] FDA and has been authorized for detection and/or diagnosis of SARS-CoV-2 by FDA under an Emergency Use Authorization (EUA). This EUA will remain in effect (meaning this test can be used) for the duration of the COVID-19 declaration under Section 564(b)(1) of the Act, 21 U.S.C. section 360bbb-3(b)(1), unless the authorization is terminated or revoked.     Resp Syncytial Virus by PCR NEGATIVE NEGATIVE Final    Comment: (NOTE) Fact Sheet for Patients: BloggerCourse.com  Fact Sheet for Healthcare Providers: SeriousBroker.it  This test is not yet approved or cleared by the [place] FDA and has been authorized for detection and/or diagnosis of SARS-CoV-2 by FDA under an Emergency Use Authorization (EUA). This EUA will remain in effect (meaning this test can be used) for the duration of the COVID-19 declaration under Section 564(b)(1) of the Act, 21 U.S.C. section 360bbb-3(b)(1), unless the authorization is terminated or revoked.  Performed at [facility], [address], [place], [place] [address]   Blood Culture (routine x 2)     Status: None (Preliminary result)   Collection Time: 12/03/22  2:14 PM   Specimen: Right Antecubital; Blood  Result Value Ref Range Status   Specimen Description   Final    RIGHT ANTECUBITAL Performed at [facility], [address] [place], [place] [address]    Special Requests   Final    BOTTLES DRAWN AEROBIC AND ANAEROBIC Blood Culture adequate volume Performed at [facility], [address], [place], [place] [address]    Culture   Final    NO GROWTH 2 DAYS Performed at [facility], [address], [place], [place] [address]    Report Status PENDING  Incomplete  Blood Culture (routine x 2)     Status: None (Preliminary result)   Collection Time: 12/03/22  2:15 PM   Specimen: BLOOD  Result Value Ref Range Status   Specimen Description   Final    BLOOD SITE NOT SPECIFIED Performed at [facility], [address], [place], [place] [address]    Special Requests   Final    BOTTLES DRAWN AEROBIC ONLY Blood Culture adequate volume Performed at [facility], [address], [place], [place] [address]    Culture   Final    NO GROWTH 2 DAYS Performed at [facility], [address], [place], [place] [address]    Report Status PENDING  Incomplete  C Difficile Quick Screen w PCR reflex     Status: None   Collection Time: 12/04/22  4:23 PM   Specimen: Stool  Result Value Ref Range Status   C Diff antigen NEGATIVE NEGATIVE Final   C Diff toxin NEGATIVE NEGATIVE Final   C Diff interpretation No C. difficile detected.  Final    Comment: Performed at [facility], [address], [place], [place] [address]    Antimicrobials: Anti-infectives (From admission, onward)    None      Culture/Microbiology    Component Value Date/Time   SDES  12/03/2022 1415    BLOOD SITE NOT SPECIFIED Performed at [facility], [address] [place],  [place] [address]    SPECREQUEST  12/03/2022 1415    BOTTLES DRAWN AEROBIC ONLY Blood Culture adequate volume Performed at [facility], [address], [place], [place] [address]    CULT  12/03/2022 1415    NO  GROWTH 2 DAYS Performed at [facility], [address], [place], [place] [address]    REPTSTATUS PENDING 12/03/2022 1415     Radiology Studies: [facility] Chest Port 1 View  Result Date: 12/03/2022 CLINICAL DATA:  Question of sepsis to evaluate for abnormality. Hypotension. EXAM: PORTABLE CHEST 1 VIEW COMPARISON:  10/17/2020 FINDINGS: Heart size and pulmonary vascularity are normal. Lungs are clear. No pleural effusions. No pneumothorax. Mediastinal contours appear intact. IMPRESSION: No active disease. Electronically Signed   By: Burman Nieves M.D.   On: 12/03/2022 17:25     LOS: 1 day   Hughie Closs, MD [facility]  12/05/2022, 9:26 AM

## 2022-12-05 NOTE — Plan of Care (Signed)

## 2022-12-05 NOTE — Consult Note (Signed)
WOC Nurse Consult Note: Reason for Consult: pressure injuries Wound type: Healing Stage 3 Pressure Injuries; right trochanter, left trochanter, left leg Pressure Injury POA: Yes Measurement: see nursing flowsheet Wound ZOX:WRUEA and pink Drainage (amount, consistency, odor) none Periwound: intact, evidence of healing previously larger wounds Dressing procedure/placement/frequency: Cleanse all wounds with saline, pat dry Cover with single layer of xeroform gauze, top with foam. Change every other day Low air loss mattress for moisture management and pressure redistribution in high risk patient  History of ileal conduit in 2018, WOC nurses familiar with this patient for treatment and pressure injuries in the past. Patient does not have a bowel stoma.  Ostomy care orders placed for IC, care and supplies.   Discussed POC with bedside nurse via secure chat  Re consult if needed, will not follow at this time. Thanks  Latonya Nelon [employer], RN,CWOCN, CNS, CWON-AP [phone])

## 2022-12-06 DIAGNOSIS — I959 Hypotension, unspecified: Secondary | ICD-10-CM | POA: Diagnosis not present

## 2022-12-06 LAB — CBC
HCT: 27.9 % — ABNORMAL LOW (ref 36.0–46.0)
Hemoglobin: 8.7 g/dL — ABNORMAL LOW (ref 12.0–15.0)
MCH: 28.8 pg (ref 26.0–34.0)
MCHC: 31.2 g/dL (ref 30.0–36.0)
MCV: 92.4 fL (ref 80.0–100.0)
Platelets: 329 10*3/uL (ref 150–400)
RBC: 3.02 MIL/uL — ABNORMAL LOW (ref 3.87–5.11)
RDW: 15.5 % (ref 11.5–15.5)
WBC: 5.9 10*3/uL (ref 4.0–10.5)
nRBC: 0 % (ref 0.0–0.2)

## 2022-12-06 LAB — BASIC METABOLIC PANEL
Anion gap: 11 (ref 5–15)
BUN: 25 mg/dL — ABNORMAL HIGH (ref 6–20)
CO2: 23 mmol/L (ref 22–32)
Calcium: 8.2 mg/dL — ABNORMAL LOW (ref 8.9–10.3)
Chloride: 107 mmol/L (ref 98–111)
Creatinine, Ser: 0.72 mg/dL (ref 0.44–1.00)
GFR, Estimated: >60 mL/min (ref >60)
Glucose, Bld: 139 mg/dL — ABNORMAL HIGH (ref 70–99)
Potassium: 3.6 mmol/L (ref 3.5–5.1)
Sodium: 141 mmol/L (ref 135–145)

## 2022-12-06 LAB — LACTIC ACID, PLASMA: Lactic Acid, Venous: 1.4 mmol/L (ref 0.5–1.9)

## 2022-12-06 NOTE — TOC Initial Note (Signed)
Transition of Care [facility]) - Initial/Assessment Note    Patient Details  Name: Deanna Baldwin MRN: [id number] Date of Birth: 03-27-1975  [facility] of [facility]) CM/SW Contact:    Otelia Santee, LCSW Phone Number: 12/06/2022, 12:30 PM  Clinical Narrative:                 Pt is from [facility] where she is a long term care resident. Pt is able to return at discharge and will need ambulance transport back.   Expected Discharge Plan: Long Term Nursing Home Barriers to Discharge: No Barriers Identified   Patient Goals and CMS Choice Patient states their goals for this hospitalization and ongoing recovery are:: To return to [email or web address].gov Compare Post Acute Care list provided to::  (NA) Choice offered to / list presented to :  (NA) [place] ownership interest in [facility].provided to::  (NA)    Expected Discharge Plan and Services In-house Referral: Clinical Social Work Discharge Planning Services: NA Post Acute Care Choice: Resumption of Svcs/PTA Provider Living arrangements for the past 2 months: Skilled Nursing Facility                 DME Arranged: N/A DME Agency: NA                  Prior Living Arrangements/Services Living arrangements for the past 2 months: Skilled Nursing Facility Lives with:: Facility Resident Patient language and need for interpreter reviewed:: Yes Do you feel safe going back to the place where you live?: Yes      Need for Family Participation in Patient Care: Yes (Comment) Care giver support system in place?: Yes (comment) Current home services: DME Criminal Activity/Legal Involvement Pertinent to Current Situation/Hospitalization: No - Comment as needed  Activities of Daily Living      Permission Sought/Granted Permission sought to share information with : Facility Industrial/product designer granted to share information with : Yes, Verbal Permission Granted     Permission granted to share  info w AGENCY: [facility]        Emotional Assessment       Orientation: : Oriented to Self, Oriented to Place, Oriented to  Time, Oriented to Situation Alcohol / Substance Use: Not Applicable Psych Involvement: No (comment)  Admission diagnosis:  Hypotension [I95.9] Patient Active Problem List   Diagnosis Date Noted   Seizure (HCC) 12/03/2022   Hyponatremia 12/03/2022   Hyperkalemia 12/03/2022   Secondary progressive multiple sclerosis (HCC) 06/22/2022   Left-sided weakness 05/10/2022   Adult failure to thrive    Pressure injury of skin 10/18/2020   DNR (do not resuscitate) discussion 03/08/2018   Atelectasis    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HCC) 05/23/2017   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/19/2017   Hypothyroidism 04/09/2017   Hypocalcemia 04/08/2017   Anemia 04/08/2017   Osteomyelitis (HCC) 03/19/2017   Acute osteomyelitis of left pelvic region and thigh (HCC)    Paraplegia (HCC) 03/09/2017   Chronic constipation 02/13/2017   Chronic pain 12/13/2016   Hypotension 08/31/2016   Vitamin B 12 deficiency 07/09/2016   Pressure ulcer, 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   Unintentional weight loss 07/06/2016   Neurogenic bladder 04/09/2016   Anemia of chronic disease 04/06/2016   Dysphagia, oral phase 03/17/2016   Palliative care by specialist    Decubitus ulcer of sacral region, unstageable (HCC)    Protein-calorie malnutrition, severe (HCC)    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 [place] [address] Phone: [phone] Fax: [phone]     Social Determinants of Health (SDOH) Social History: SDOH Screenings   Food Insecurity: No Food Insecurity (08/18/2017)  Transportation Needs: No Transportation Needs (08/18/2017)  Financial Resource Strain: Low Risk  (08/18/2017)  Physical  Activity: Inactive (08/18/2017)  Social Connections: Moderately Isolated (08/18/2017)  Stress: No Stress Concern Present (08/18/2017)  Tobacco Use: Medium Risk (11/22/2022)   SDOH Interventions:     Readmission Risk Interventions    12/06/2022   12:29 PM  Readmission Risk Prevention Plan  Transportation Screening Complete  PCP or Specialist Appt within 5-7 Days Complete  Home Care Screening Complete  Medication Review (RN CM) Complete

## 2022-12-06 NOTE — [place] FL2 (Signed)
[place] MEDICAID FL2 LEVEL OF CARE FORM     IDENTIFICATION  Patient Name: Deanna Baldwin Birthdate: 1975/09/20 Sex: female Admission Date (Current Location): 12/03/2022  [facility] and [place] Number:  Producer, television/film/video and Address:  [facility],  [address] 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   Left-sided weakness 05/10/2022   Adult failure to thrive    Pressure injury of skin 10/18/2020   DNR (do not resuscitate) discussion 03/08/2018   Atelectasis    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HCC) 05/23/2017   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/19/2017   Hypothyroidism 04/09/2017   Hypocalcemia 04/08/2017   Anemia 04/08/2017   Osteomyelitis (HCC) 03/19/2017   Acute osteomyelitis of left pelvic region and thigh (HCC)    Paraplegia (HCC) 03/09/2017   Chronic constipation 02/13/2017   Chronic pain 12/13/2016   Hypotension 08/31/2016   Vitamin B 12 deficiency 07/09/2016   Pressure ulcer, 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   Unintentional weight loss 07/06/2016   Neurogenic bladder 04/09/2016   Anemia of chronic disease 04/06/2016   Dysphagia, oral phase 03/17/2016   Palliative care by specialist    Decubitus ulcer of sacral region, unstageable (HCC)    Protein-calorie malnutrition, severe (HCC)    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) 03/02/2016   Elevated liver function tests 03/02/2016    Orientation RESPIRATION  BLADDER Height & Weight     Self, Situation, Time, Place  Normal Continent, Urostomy Weight: 100 lb (45.4 kg) Height:  4\' 11"  (149.9 cm)  BEHAVIORAL SYMPTOMS/MOOD NEUROLOGICAL BOWEL NUTRITION STATUS      Incontinent Diet (See discharge summary)  AMBULATORY STATUS COMMUNICATION OF NEEDS Skin   Limited Assist Verbally PU Stage and Appropriate Care (Buttocks Left Stage 2, Thigh Right Stage 2, Hip Right Stage 1)                       Personal Care Assistance Level of Assistance  Bathing, Feeding, Dressing Bathing Assistance: Limited assistance Feeding assistance: Independent Dressing Assistance: Limited assistance     Functional Limitations Info  Sight, Hearing, Speech Sight Info: Adequate Hearing Info: Adequate Speech Info: Adequate    SPECIAL CARE FACTORS FREQUENCY                       Contractures Contractures Info: Not present    Additional Factors Info  Code Status, Allergies Code Status Info: DNR Allergies Info: Natalizumab           Current Medications (12/06/2022):  This is the current hospital active medication list Current Facility-Administered Medications  Medication Dose Route Frequency Provider Last Rate Last Admin   acetaminophen (TYLENOL) tablet 650 mg  650 mg Oral Q6H PRN Bobette Mo, MD       Or   acetaminophen (TYLENOL) suppository 650 mg  650 mg Rectal Q6H PRN Bobette Mo, MD       baclofen (LIORESAL) tablet 10 mg  10  mg Oral TID Bobette Mo, MD   10 mg at 12/06/22 1001   calcitRIOL (ROCALTROL) capsule 0.25 mcg  0.25 mcg Oral BID Bobette Mo, MD   0.25 mcg at 12/06/22 1001   levETIRAcetam (KEPPRA) tablet 500 mg  500 mg Oral BID Bobette Mo, MD   500 mg at 12/06/22 1001   levothyroxine (SYNTHROID) tablet 100 mcg  100 mcg Oral QAC breakfast Bobette Mo, MD   100 mcg at 12/06/22 0550   magnesium oxide (MAG-OX) tablet 800 mg  800 mg Oral BID Bobette Mo, MD   800 mg at 12/06/22 1001    midodrine (PROAMATINE) tablet 10 mg  10 mg Oral TID WC Kc, Dayna Barker, MD   10 mg at 12/06/22 1001   ondansetron (ZOFRAN) tablet 4 mg  4 mg Oral Q6H PRN Bobette Mo, MD       Or   ondansetron [facility]) injection 4 mg  4 mg Intravenous Q6H PRN Bobette Mo, MD       Oral care mouth rinse  15 mL Mouth Rinse 4 times per day Bobette Mo, MD   15 mL at 12/05/22 2249   Oral care mouth rinse  15 mL Mouth Rinse PRN Bobette Mo, MD       pantoprazole (PROTONIX) EC tablet 40 mg  40 mg Oral Daily Bobette Mo, MD   40 mg at 12/06/22 1001   rosuvastatin (CRESTOR) tablet 20 mg  20 mg Oral QHS Bobette Mo, MD   20 mg at 12/05/22 2244     Discharge Medications: Please see discharge summary for a list of discharge medications.  Relevant Imaging Results:  Relevant Lab Results:   Additional Information [id number]  Otelia Santee, LCSW

## 2022-12-06 NOTE — Plan of Care (Signed)

## 2022-12-06 NOTE — Progress Notes (Signed)
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  Per EMS his BP was 82/50. In the ED: Blood pressure has been in 70s to 80s systolic. white count 10.6, hemoglobin 9.8 g/dL and platelets 035. PT 15.4, INR 1.2 and PTT 26. Lactic acid was normal. Negative coronavirus, influenza and RSV PCR. CMP showed a sodium 131, potassium 5.3, chloride 93 and CO2 25 mmol/L. Glucose 116, BUN 61 and creatinine 0.87 mg/dL. Her LFTs were normal with EF session with an albumin level 2.3 g/dL  Chest x-ray no active disease Blood culture urine culture ordered Patient was given multiple fluid boluses and admitted     Subjective: Patient seen and examined.  She has no complaints.  She did not know that she had low-grade fever last evening.   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 She was on midodrine 2.5 mg 3 times daily.  Recent echo 11/10/2020 EF 65 to 70%.  11/1 received 1.5 L bolus NS.  Early morning 11-24, she received 1 L RL and 1125 g.  Increase midodrine from 2.5 to 10 mg 3 times daily.  No obvious infection noticed  Cortisol and TSH within normal limits.  Blood pressure improving.  Fever: She had low-grade fever of  around 100.5 around 6 PM on 12/05/2022.  UA borderline tested 11-24 and urine culture also shows only 30,000 colonies of Enterococcus faecalis.  Not really convinced that this is real UTI since she has been doing fine without any antibiotics.  Unsure if the fever was also real.  We will observe her overnight, if no fever, will discharge without antibiotics.  Mild lactic acidosis: Likely from hypertension.  Now resolved.  Acute on chronic anemia of chronic disease Hemoglobin dropped to 6.8 g after extensive multiple IV fluid boluses.  No signs of bleeding, denied melena.  She received 1 unit of PRBC transfusion and hemoglobin has improved from 9.  I doubt the authenticity of results of the CBC with such a great improvement in hemoglobin with 1 unit of PRBCs transfusion.  Monitor with CBC daily.   Recent Labs  Lab 12/03/22 1415 12/04/22 0940 12/04/22 1817 12/05/22 0615 12/06/22 0507  HGB 9.8* 6.8* 9.3* 9.0* 8.7*  HCT 33.2* 22.7* 30.1* 28.8* 27.9*    Type 6/7 stool noted: C. difficile ruled out.  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  Hyponatremia Resolve d w/ ivf   Hyperkalemia Resolved   Protein-calorie malnutrition, severe  Declined protein supplementation (worsens constipation).  Chronic constipation: Continue laxatives as needed  Hypothyroidism Continue levothyroxine 100 mcg p.o. daily.   Presence of urostomy Supportive care.   Seizure disorder: Continue Keppra 500 mg p.o. twice daily.  Goals of care: Patient has had multiple family medically and palliative care evaluation on previous admission, she is  DNR/DNI no artificial feeding MOSTVanco form completed in 2022.  DVT prophylaxis: Place and maintain sequential compression device Start: 12/04/22 1132  Code Status:   Code Status: Do not attempt resuscitation (DNR) PRE-ARREST INTERVENTIONS DESIRED Family Communication: plan of care discussed with patient at bedside. Status is: Inpatient Remains inpatient appropriate because: Low-grade fever yesterday, needs monitoring.   Level of care: Telemetry   Dispo: The patient is from: NH            Anticipated disposition: NH 1-2 days  Objective: Vitals last 24 hrs: Vitals:   12/05/22 2204 12/06/22 0209 12/06/22 0604 12/06/22 1121  BP: (!) 108/59 110/62 108/67 98/62  Pulse: (!) 107 100 (!) 107 (!) 115  Resp:    20  Temp: 99.4 F (37.4 C)  98.5 F (36.9 C) 98.3 F (36.8 C)  TempSrc:      SpO2: 95% 100% 98% 99%  Weight:      Height:       Weight change:   Physical Examination:  General exam: Appears calm and comfortable  Respiratory system: Clear to auscultation. Respiratory effort normal. Cardiovascular system: S1 & S2 heard, RRR. No JVD, murmurs, rubs, gallops or clicks. No pedal edema. Gastrointestinal system: Abdomen is nondistended, soft and nontender. No organomegaly or masses felt. Normal bowel sounds heard. Central nervous system: Alert and oriented.  Contracted left upper extremity and bilateral lower extremities with paraplegia   Medications reviewed:  Scheduled Meds:  baclofen  10 mg Oral TID   calcitRIOL  0.25 mcg Oral BID   levETIRAcetam  500 mg Oral BID   levothyroxine  100 mcg Oral QAC breakfast   magnesium oxide  800 mg Oral BID   midodrine  10 mg Oral TID WC   mouth rinse  15 mL Mouth Rinse 4 times per day   pantoprazole  40 mg Oral Daily   rosuvastatin  20 mg Oral QHS   Continuous Infusions:    Diet Order             Diet regular Fluid consistency: Thin  Diet effective now                  Intake/Output Summary (Last 24 hours) at 12/06/2022  1226 Last data filed at 12/06/2022 1100 Gross per 24 hour  Intake --  Output 3100 ml  Net -3100 ml   Net IO Since Admission: -4,600 mL [12/06/22 1226]  Wt Readings from Last 3 Encounters:  12/03/22 45.4 kg  11/12/20 47.6 kg  10/24/20 42.7 kg     Unresulted Labs (From admission, onward)     Start     Ordered   12/06/22 0822  Urinalysis, Routine w reflex microscopic -Urine, Clean Catch  Once,   R       Question:  Specimen Source  Answer:  Urine, Clean Catch   12/06/22 0821   12/05/22 0500  Basic metabolic panel  Daily,   R      12/04/22 0802   12/05/22 0500  CBC  Daily,   R      12/04/22 0802          Data Reviewed: I have personally reviewed following labs and imaging studies CBC: Recent Labs  Lab 12/03/22 1415  12/04/22 0940 12/04/22 1817 12/05/22 0615 12/06/22 0507  WBC 10.6* 5.4  --  5.3 5.9  NEUTROABS 8.4*  --   --   --   --   HGB 9.8* 6.8* 9.3* 9.0* 8.7*  HCT 33.2* 22.7* 30.1* 28.8* 27.9*  MCV 94.9 95.0  --  90.3 92.4  PLT 621* 426*  --  335 329   Basic Metabolic Panel: Recent Labs  Lab 12/03/22 1415 12/04/22 0611 12/05/22 0615 12/06/22 0507  NA 131* 135 143 141  K 5.3* 3.7 3.8 3.6  CL 93* 105 114* 107  CO2 25 21* 22 23  GLUCOSE 116* 118* 149* 139*  BUN 61* 42* 36* 25*  CREATININE 0.87 0.85 0.74 0.72  CALCIUM 8.9 7.7* 8.4* 8.2*   GFR: Estimated Creatinine Clearance: 59.9 mL/min (by C-G formula based on SCr of 0.72 mg/dL). Liver Function Tests: Recent Labs  Lab 12/03/22 1415 12/04/22 0611  AST 23 18  ALT 14 13  ALKPHOS 115 64  BILITOT 0.5 0.2*  PROT 7.7 4.5*  ALBUMIN 2.3* 1.7*   No results for input(s): "LIPASE", "AMYLASE" in the last 168 hours. No results for input(s): "AMMONIA" in the last 168 hours. Coagulation Profile: Recent Labs  Lab 12/03/22 1415  INR 1.2   BNP (last 3 results) No results for input(s): "PROBNP" in the last 8760 hours. HbA1C: No results for input(s): "HGBA1C" in the last 72 hours. CBG: Recent Labs  Lab  12/03/22 1309  GLUCAP 132*   Lipid Profile: No results for input(s): "CHOL", "HDL", "LDLCALC", "TRIG", "CHOLHDL", "LDLDIRECT" in the last 72 hours. Thyroid Function Tests: Recent Labs    12/04/22 0940  TSH 0.012*   Sepsis Labs: Recent Labs  Lab 12/04/22 0611 12/05/22 0950 12/05/22 1647 12/06/22 0507  LATICACIDVEN 2.1* 2.7* 3.4* 1.4    Recent Results (from the past 240 hour(s))  Resp panel by RT-PCR (RSV, Flu A&B, Covid) Anterior Nasal Swab     Status: None   Collection Time: 12/03/22  1:14 PM   Specimen: Anterior Nasal Swab  Result Value Ref Range Status   SARS Coronavirus 2 by RT PCR NEGATIVE NEGATIVE Final    Comment: (NOTE) SARS-CoV-2 target nucleic acids are NOT DETECTED.  The SARS-CoV-2 RNA is generally detectable in upper respiratory specimens during the acute phase of infection. The lowest concentration of SARS-CoV-2 viral copies this assay can detect is 138 copies/mL. A negative result does not preclude SARS-Cov-2 infection and should not be used as the sole basis for treatment or other patient management decisions. A negative result may occur with  improper specimen collection/handling, submission of specimen other than nasopharyngeal swab, presence of viral mutation(s) within the areas targeted by this assay, and inadequate number of viral copies(<138 copies/mL). A negative result must be combined with clinical observations, patient history, and epidemiological information. The expected result is Negative.  Fact Sheet for Patients:  BloggerCourse.com  Fact Sheet for Healthcare Providers:  SeriousBroker.it  This test is no t yet approved or cleared by the [place] FDA and  has been authorized for detection and/or diagnosis of SARS-CoV-2 by FDA under an Emergency Use Authorization (EUA). This EUA will remain  in effect (meaning this test can be used) for the duration of the COVID-19 declaration under  Section 564(b)(1) of the Act, 21 U.S.C.section 360bbb-3(b)(1), unless the authorization is terminated  or revoked sooner.       Influenza A by PCR NEGATIVE NEGATIVE Final   Influenza B by PCR NEGATIVE NEGATIVE Final  Comment: (NOTE) The Xpert Xpress SARS-CoV-2/FLU/RSV plus assay is intended as an aid in the diagnosis of influenza from Nasopharyngeal swab specimens and should not be used as a sole basis for treatment. Nasal washings and aspirates are unacceptable for Xpert Xpress SARS-CoV-2/FLU/RSV testing.  Fact Sheet for Patients: BloggerCourse.com  Fact Sheet for Healthcare Providers: SeriousBroker.it  This test is not yet approved or cleared by the [place] FDA and has been authorized for detection and/or diagnosis of SARS-CoV-2 by FDA under an Emergency Use Authorization (EUA). This EUA will remain in effect (meaning this test can be used) for the duration of the COVID-19 declaration under Section 564(b)(1) of the Act, 21 U.S.C. section 360bbb-3(b)(1), unless the authorization is terminated or revoked.     Resp Syncytial Virus by PCR NEGATIVE NEGATIVE Final    Comment: (NOTE) Fact Sheet for Patients: BloggerCourse.com  Fact Sheet for Healthcare Providers: SeriousBroker.it  This test is not yet approved or cleared by the [place] FDA and has been authorized for detection and/or diagnosis of SARS-CoV-2 by FDA under an Emergency Use Authorization (EUA). This EUA will remain in effect (meaning this test can be used) for the duration of the COVID-19 declaration under Section 564(b)(1) of the Act, 21 U.S.C. section 360bbb-3(b)(1), unless the authorization is terminated or revoked.  Performed at [facility], [address], [place], [place] [address]   Blood Culture (routine x 2)     Status: None (Preliminary result)   Collection Time:  12/03/22  2:14 PM   Specimen: Right Antecubital; Blood  Result Value Ref Range Status   Specimen Description   Final    RIGHT ANTECUBITAL Performed at [facility], [address], [place], [place] [address]    Special Requests   Final    BOTTLES DRAWN AEROBIC AND ANAEROBIC Blood Culture adequate volume Performed at [facility], [address], [place], [place] [address]    Culture   Final    NO GROWTH 2 DAYS Performed at [facility], [address] [place], [place] [address]    Report Status PENDING  Incomplete  Blood Culture (routine x 2)     Status: None (Preliminary result)   Collection Time: 12/03/22  2:15 PM   Specimen: BLOOD  Result Value Ref Range Status   Specimen Description   Final    BLOOD SITE NOT SPECIFIED Performed at [facility], [address], [place], [place] [address]    Special Requests   Final    BOTTLES DRAWN AEROBIC ONLY Blood Culture adequate volume Performed at [facility], [address], [place], [place] [address]    Culture   Final    NO GROWTH 2 DAYS Performed at [facility], [address] [place], [place] [address]    Report Status PENDING  Incomplete  Urine Culture     Status: Abnormal (Preliminary result)   Collection Time: 12/04/22  5:39 AM   Specimen: Urine, Catheterized  Result Value Ref Range Status   Specimen Description   Final    URINE, CATHETERIZED Performed at [facility], [address] [place], [place] [address]    Special Requests   Final    NONE Performed at [facility], [address] [place], [place] [address]    Culture (A)  Final    30,000 COLONIES/mL ENTEROCOCCUS FAECALIS CULTURE REINCUBATED FOR BETTER GROWTH Performed at [facility], [address],  [place], [place] [address]    Report Status PENDING  Incomplete  C Difficile Quick Screen w PCR reflex     Status: None    Collection Time: 12/04/22  4:23 PM   Specimen: Stool  Result Value Ref Range Status   C Diff antigen NEGATIVE NEGATIVE Final   C Diff toxin NEGATIVE NEGATIVE Final   C Diff interpretation No C. difficile detected.  Final    Comment: Performed at [facility], [address], [place], [place] [address]    Antimicrobials: Anti-infectives (From admission, onward)    None      Culture/Microbiology    Component Value Date/Time   SDES  12/04/2022 0539    URINE, CATHETERIZED Performed at [facility], [address] [place], [place] [address]    SPECREQUEST  12/04/2022 6301    NONE Performed at [facility], [address] [place], [place] [address]    CULT (A) 12/04/2022 0539    30,000 COLONIES/mL ENTEROCOCCUS FAECALIS CULTURE REINCUBATED FOR BETTER GROWTH Performed at [facility], [address], [place], [place] [address]    REPTSTATUS PENDING 12/04/2022 7322     Radiology Studies: No results found.   LOS: 2 days   Hughie Closs, MD [facility]  12/06/2022, 12:26 PM

## 2022-12-06 NOTE — Plan of Care (Signed)
No acute events overnight. Problem: Education: Goal: Knowledge of General Education information will improve Description: Including pain rating scale, medication(s)/side effects and non-pharmacologic comfort measures Outcome: Progressing   Problem: Health Behavior/Discharge Planning: Goal: Ability to manage health-related needs will improve Outcome: Progressing   Problem: Clinical Measurements: Goal: Ability to maintain clinical measurements within normal limits will improve Outcome: Progressing Goal: Will remain free from infection Outcome: Progressing Goal: Diagnostic test results will improve Outcome: Progressing Goal: Respiratory complications will improve Outcome: Progressing Goal: Cardiovascular complication will be avoided Outcome: Progressing   Problem: Activity: Goal: Risk for activity intolerance will decrease Outcome: Progressing   Problem: Nutrition: Goal: Adequate nutrition will be maintained Outcome: Progressing   Problem: Coping: Goal: Level of anxiety will decrease Outcome: Progressing   Problem: Elimination: Goal: Will not experience complications related to bowel motility Outcome: Progressing Goal: Will not experience complications related to urinary retention Outcome: Progressing   Problem: Pain Management: Goal: General experience of comfort will improve Outcome: Progressing   Problem: Safety: Goal: Ability to remain free from injury will improve Outcome: Progressing   Problem: Skin Integrity: Goal: Risk for impaired skin integrity will decrease Outcome: Progressing

## 2022-12-07 DIAGNOSIS — I959 Hypotension, unspecified: Secondary | ICD-10-CM | POA: Diagnosis not present

## 2022-12-07 LAB — BASIC METABOLIC PANEL
Anion gap: 7 (ref 5–15)
BUN: 28 mg/dL — ABNORMAL HIGH (ref 6–20)
CO2: 25 mmol/L (ref 22–32)
Calcium: 8.7 mg/dL — ABNORMAL LOW (ref 8.9–10.3)
Chloride: 110 mmol/L (ref 98–111)
Creatinine, Ser: 0.49 mg/dL (ref 0.44–1.00)
GFR, Estimated: >60 mL/min (ref >60)
Glucose, Bld: 126 mg/dL — ABNORMAL HIGH (ref 70–99)
Potassium: 3.5 mmol/L (ref 3.5–5.1)
Sodium: 142 mmol/L (ref 135–145)

## 2022-12-07 LAB — CBC
HCT: 28.5 % — ABNORMAL LOW (ref 36.0–46.0)
Hemoglobin: 8.7 g/dL — ABNORMAL LOW (ref 12.0–15.0)
MCH: 28.7 pg (ref 26.0–34.0)
MCHC: 30.5 g/dL (ref 30.0–36.0)
MCV: 94.1 fL (ref 80.0–100.0)
Platelets: 363 10*3/uL (ref 150–400)
RBC: 3.03 MIL/uL — ABNORMAL LOW (ref 3.87–5.11)
RDW: 15.6 % — ABNORMAL HIGH (ref 11.5–15.5)
WBC: 5 10*3/uL (ref 4.0–10.5)
nRBC: 0 % (ref 0.0–0.2)

## 2022-12-07 LAB — URINE CULTURE

## 2022-12-07 MED ORDER — MIDODRINE HCL 10 MG PO TABS: 10.0000 mg | ORAL_TABLET | Freq: Three times a day (TID) | ORAL | 0 refills | Status: AC

## 2022-12-07 MED ORDER — ASPIRIN 81 MG PO TBEC
81.0000 mg | DELAYED_RELEASE_TABLET | Freq: Every day | ORAL | Status: DC
Start: 2022-12-07 — End: 2022-12-08
  Administered 2022-12-07: 81 mg via ORAL
  Filled 2022-12-07: qty 1

## 2022-12-07 MED ORDER — FERROUS SULFATE 325 (65 FE) MG PO TABS: 325.0000 mg | ORAL_TABLET | Freq: Every day | ORAL | Status: DC

## 2022-12-07 MED ORDER — LEVOTHYROXINE SODIUM 75 MCG PO TABS
75.0000 ug | ORAL_TABLET | Freq: Every day | ORAL | Status: DC
Start: 2022-12-08 — End: 2022-12-08

## 2022-12-07 MED ORDER — VITAMIN D 25 MCG (1000 UNIT) PO TABS
1000.0000 [IU] | ORAL_TABLET | Freq: Every day | ORAL | Status: DC
  Administered 2022-12-07: 1000 [IU] via ORAL
  Filled 2022-12-07: qty 1

## 2022-12-07 NOTE — Discharge Summary (Signed)
Physician Discharge Summary  Deanna Baldwin WJX:[id number] DOB: 08/18/1975 DOA: 12/03/2022  PCP: System, Provider Not In  Admit date: 12/03/2022 Discharge date: 12/07/2022 30 Day Unplanned Readmission Risk Score    Flowsheet Row ED to Hosp-Admission (Current) from 12/03/2022 in [facility]  30 Day Unplanned Readmission Risk Score (%) 16.8 Filed at 12/07/2022 0801       This score is the patient's risk of an unplanned readmission within 30 days of being discharged (0 -100%). The score is based on dignosis, age, lab data, medications, orders, and past utilization.   Low:  0-14.9   Medium: 15-21.9   High: 22-29.9   Extreme: 30 and above          Admitted From: SNF Disposition: SNF  Recommendations for Outpatient Follow-up:  Follow up with PCP in 1-2 weeks Please obtain BMP/CBC in one week Please follow up with your PCP on the following pending results: Unresulted Labs (From admission, onward)     Start     Ordered   12/05/22 0500  Basic metabolic panel  Daily,   R      12/04/22 0802   12/05/22 0500  CBC  Daily,   R      12/04/22 0802              Home Health: None Equipment/Devices: None  Discharge Condition: Stable CODE STATUS: DNR Diet recommendation: Cardiac  Subjective: Seen and examined.  She has no complaints.  She is very happy that she is being discharged today.  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  Per EMS her BP was 82/50. In the ED: Blood pressure was in 70s to 80s systolic. white count 10.6, hemoglobin 9.8 g/dL and platelets 213. PT 15.4, INR 1.2 and PTT 26. Lactic acid was normal. Negative  coronavirus, influenza and RSV PCR. CMP showed a sodium 131, potassium 5.3, chloride 93 and CO2 25 mmol/L. Glucose 116, BUN 61 and creatinine 0.87 mg/dL. Her LFTs were normal with EF session with an albumin level 2.3 g/dL  Chest x-ray no active disease Blood culture urine culture ordered Patient was given multiple fluid boluses and admitted under hospitalist service.   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 She was on midodrine 2.5 mg 3 times daily.  Recent echo 11/10/2020 EF 65 to 70%.  11/1 received 1.5 L bolus NS.  Early morning 11-24, she received 1 L RL and 1125 g.  Increase midodrine from 2.5 to 10 mg 3 times daily.  No obvious infection noticed  Cortisol and TSH within normal limits.  Blood pressure improved.   Fever: She had low-grade fever of 100.5 around 6 PM on 12/05/2022.  UA borderline checked on 11-24 and urine culture also shows only 30,000 colonies of Enterococcus faecalis.  Not really convinced that this is real UTI since she has been doing fine without any antibiotics.  Unsure if the fever was also real.  We observed her and she has remained afebrile since then.   Mild lactic acidosis: Likely from hypertension.  Now resolved.   Acute on chronic anemia of chronic disease Hemoglobin dropped to 6.8 g after extensive multiple IV fluid boluses.  No signs of bleeding,  denied melena.  She received 1 unit of PRBC transfusion and hemoglobin has improved from 9.  I doubt the authenticity of results of the CBC with such a great improvement in hemoglobin with 1 unit of PRBCs transfusion.  Monitor with CBC daily.    Type 6/7 stool noted: C. difficile ruled out.   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   Hyponatremia Resolve d w/ ivf   Hyperkalemia Resolved   Protein-calorie malnutrition, severe  Declined protein supplementation (worsens constipation).   Chronic constipation: Continue laxatives as needed   Hypothyroidism Continue levothyroxine 100 mcg p.o. daily.   Presence of urostomy Supportive care.   Seizure disorder: Continue Keppra 500 mg p.o. twice daily.   Goals of care: Patient has had multiple family medically and palliative care evaluation on previous admission, she is DNR/DNI no artificial feeding MOSTVanco form completed in 2022.  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)   Protein-calorie malnutrition, severe (HCC)   Anemia of chronic disease   Hypothyroidism   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HCC)   Seizure (HCC)   Hyponatremia   Hyperkalemia    Discharge Instructions   Allergies as of 12/07/2022       Reactions   Natalizumab Other (See Comments)   Allergy not noted on MAR, but Flushing and tremors were in Epic        Medication List     STOP taking these medications    calcium carbonate 500 MG chewable tablet Commonly known as: TUMS - dosed in mg elemental calcium   cefTRIAXone IVPB Commonly known as: ROCEPHIN       TAKE these medications    acetaminophen 325 MG tablet Commonly known as: TYLENOL Take 2 tablets (650 mg total) by mouth every 6 (six) hours as needed for mild pain (or Fever >/= 101).   aspirin EC 81 MG tablet Take 81 mg by mouth in the morning. Swallow whole.   baclofen 10 MG tablet Commonly known as: LIORESAL Take 10 mg by mouth 3 (three) times daily.   bisacodyl 5 MG EC tablet Commonly known as: DULCOLAX Take 5 mg by mouth daily.   calcitRIOL 0.25 MCG capsule Commonly known  as: ROCALTROL Take 1 capsule (0.25 mcg total) by mouth 2 (two) times daily.   Cholecalciferol 25 MCG (1000 UT) capsule Take 1,000 Units by mouth daily.   ferrous sulfate 325 (65 FE) MG tablet Take 325 mg by mouth daily with breakfast.   Keppra 500 MG tablet Generic drug: levETIRAcetam Take 500 mg by mouth in the morning and at bedtime.   levothyroxine 75 MCG tablet Commonly known as: SYNTHROID Take 75 mcg by mouth daily before breakfast.   magnesium hydroxide 400 MG/5ML suspension Commonly known as: MILK OF MAGNESIA Take 30 mLs by mouth every 12 (twelve) hours as needed for mild constipation.   magnesium oxide 400 (240 Mg) MG tablet Commonly known as: MAG-OX Take 2 tablets (800 mg total) by mouth 2 (two) times daily.   midodrine 10 MG tablet Commonly known as: PROAMATINE Take 1 tablet (10 mg total) by mouth 3 (three) times daily with meals. What  changed:  medication strength how much to take   multivitamin tablet Take 1 tablet by mouth daily.   NON FORMULARY Take 120 mLs by mouth See admin instructions. MedPass shake- Drink 120 ml's by mouth three times a day   NON FORMULARY Take 1 Bottle by mouth See admin instructions. "AHR house shake"- Drink 1 bottle/shake by mouth two times a day   NON FORMULARY See admin instructions. "AHR frozen nutritional treat"- Eat 1 treat by mouth three times a day with each meal   omeprazole 20 MG capsule Commonly known as: PRILOSEC Take 20 mg by mouth every other day.   polyethylene glycol 17 g packet Commonly known as: MIRALAX / GLYCOLAX Take 17 g by mouth See admin instructions. Mix 17 grams of powder into 4-8 ounces of water and drink by mouth every morning   PROBIOTIC PO Take 1 tablet by mouth daily.   PROTEIN PO Take 30 mLs by mouth 2 (two) times daily.   rosuvastatin 20 MG tablet Commonly known as: CRESTOR Take 20 mg by mouth at bedtime.   simethicone 80 MG chewable tablet Commonly known as: MYLICON Chew 80 mg by  mouth every 12 (twelve) hours as needed for flatulence.   tiZANidine 2 MG tablet Commonly known as: ZANAFLEX Take 2 mg by mouth 3 (three) times daily.        Follow-up Information     PCP Follow up in 1 week(s).                 Allergies  Allergen Reactions   Natalizumab Other (See Comments)    Allergy not noted on MAR, but Flushing and tremors were in Epic    Consultations: None   Procedures/Studies: DG Chest Port 1 View  Result Date: 12/03/2022 CLINICAL DATA:  Question of sepsis to evaluate for abnormality. Hypotension. EXAM: PORTABLE CHEST 1 VIEW COMPARISON:  10/17/2020 FINDINGS: Heart size and pulmonary vascularity are normal. Lungs are clear. No pleural effusions. No pneumothorax. Mediastinal contours appear intact. IMPRESSION: No active disease. Electronically Signed   By: Burman Nieves M.D.   On: 12/03/2022 17:25     Discharge Exam: Vitals:   12/07/22 0028 12/07/22 0435  BP: 102/67 96/69  Pulse: (!) 110 (!) 115  Resp: 18 18  Temp: 99 F (37.2 C) 98.4 F (36.9 C)  SpO2: 95% 95%   Vitals:   12/06/22 1618 12/06/22 2026 12/07/22 0028 12/07/22 0435  BP: 99/64 101/72 102/67 96/69  Pulse: (!) 114 (!) 108 (!) 110 (!) 115  Resp: 18 17 18 18   Temp: 98.7 F (37.1 C) 98.5 F (36.9 C) 99 F (37.2 C) 98.4 F (36.9 C)  TempSrc:  Oral Oral Oral  SpO2: 99% 97% 95% 95%  Weight:      Height:        General: Pt is alert, awake, not in acute distress Cardiovascular: RRR, S1/S2 +, no rubs, no gallops Respiratory: CTA bilaterally, no wheezing, no rhonchi Abdominal: Soft, NT, ND, bowel sounds + Extremities: no edema, no cyanosis, contractures in left upper extremity and bilateral lower extremities.    The results of significant diagnostics from this hospitalization (including imaging, microbiology, ancillary and laboratory) are listed below for reference.     Microbiology: Recent Results (from the past 240 hour(s))  Resp panel by RT-PCR (RSV, Flu A&B,  Covid) Anterior Nasal Swab     Status: None   Collection Time: 12/03/22  1:14 PM   Specimen: Anterior Nasal Swab  Result Value Ref Range Status  SARS Coronavirus 2 by RT PCR NEGATIVE NEGATIVE Final    Comment: (NOTE) SARS-CoV-2 target nucleic acids are NOT DETECTED.  The SARS-CoV-2 RNA is generally detectable in upper respiratory specimens during the acute phase of infection. The lowest concentration of SARS-CoV-2 viral copies this assay can detect is 138 copies/mL. A negative result does not preclude SARS-Cov-2 infection and should not be used as the sole basis for treatment or other patient management decisions. A negative result may occur with  improper specimen collection/handling, submission of specimen other than nasopharyngeal swab, presence of viral mutation(s) within the areas targeted by this assay, and inadequate number of viral copies(<138 copies/mL). A negative result must be combined with clinical observations, patient history, and epidemiological information. The expected result is Negative.  Fact Sheet for Patients:  BloggerCourse.com  Fact Sheet for Healthcare Providers:  SeriousBroker.it  This test is no t yet approved or cleared by the [place] FDA and  has been authorized for detection and/or diagnosis of SARS-CoV-2 by FDA under an Emergency Use Authorization (EUA). This EUA will remain  in effect (meaning this test can be used) for the duration of the COVID-19 declaration under Section 564(b)(1) of the Act, 21 U.S.C.section 360bbb-3(b)(1), unless the authorization is terminated  or revoked sooner.       Influenza A by PCR NEGATIVE NEGATIVE Final   Influenza B by PCR NEGATIVE NEGATIVE Final    Comment: (NOTE) The Xpert Xpress SARS-CoV-2/FLU/RSV plus assay is intended as an aid in the diagnosis of influenza from Nasopharyngeal swab specimens and should not be used as a sole basis for treatment. Nasal  washings and aspirates are unacceptable for Xpert Xpress SARS-CoV-2/FLU/RSV testing.  Fact Sheet for Patients: BloggerCourse.com  Fact Sheet for Healthcare Providers: SeriousBroker.it  This test is not yet approved or cleared by the [place] FDA and has been authorized for detection and/or diagnosis of SARS-CoV-2 by FDA under an Emergency Use Authorization (EUA). This EUA will remain in effect (meaning this test can be used) for the duration of the COVID-19 declaration under Section 564(b)(1) of the Act, 21 U.S.C. section 360bbb-3(b)(1), unless the authorization is terminated or revoked.     Resp Syncytial Virus by PCR NEGATIVE NEGATIVE Final    Comment: (NOTE) Fact Sheet for Patients: BloggerCourse.com  Fact Sheet for Healthcare Providers: SeriousBroker.it  This test is not yet approved or cleared by the [place] FDA and has been authorized for detection and/or diagnosis of SARS-CoV-2 by FDA under an Emergency Use Authorization (EUA). This EUA will remain in effect (meaning this test can be used) for the duration of the COVID-19 declaration under Section 564(b)(1) of the Act, 21 U.S.C. section 360bbb-3(b)(1), unless the authorization is terminated or revoked.  Performed at [facility], [address], [place], [place] [address]   Blood Culture (routine x 2)     Status: None (Preliminary result)   Collection Time: 12/03/22  2:14 PM   Specimen: Right Antecubital; Blood  Result Value Ref Range Status   Specimen Description   Final    RIGHT ANTECUBITAL Performed at [facility], [address] [place], [place] [address]    Special Requests   Final    BOTTLES DRAWN AEROBIC AND ANAEROBIC Blood Culture adequate volume Performed at [facility], [address], [place], [place] [address]    Culture   Final     NO GROWTH 3 DAYS Performed at [facility], [address], [place], [place] [address]  Report Status PENDING  Incomplete  Blood Culture (routine x 2)     Status: None (Preliminary result)   Collection Time: 12/03/22  2:15 PM   Specimen: BLOOD  Result Value Ref Range Status   Specimen Description   Final    BLOOD SITE NOT SPECIFIED Performed at [facility], [address] [place], [place] [address]    Special Requests   Final    BOTTLES DRAWN AEROBIC ONLY Blood Culture adequate volume Performed at [facility], [address] [place], [place] [address]    Culture   Final    NO GROWTH 3 DAYS Performed at [facility], [address] [place], [place] [address]    Report Status PENDING  Incomplete  Urine Culture     Status: Abnormal   Collection Time: 12/04/22  5:39 AM   Specimen: Urine, Catheterized  Result Value Ref Range Status   Specimen Description   Final    URINE, CATHETERIZED Performed at [facility], [address] [place], [place] [address]    Special Requests   Final    NONE Performed at [facility], [address] [place], [place] [address]    Culture MULTIPLE SPECIES PRESENT, SUGGEST RECOLLECTION (A)  Final   Report Status 12/07/2022 FINAL  Final  C Difficile Quick Screen w PCR reflex     Status: None   Collection Time: 12/04/22  4:23 PM   Specimen: Stool  Result Value Ref Range Status   C Diff antigen NEGATIVE NEGATIVE Final   C Diff toxin NEGATIVE NEGATIVE Final   C Diff interpretation No C. difficile detected.  Final    Comment: Performed at [facility], [address] [place], [place] [address]     Labs: BNP (last 3 results) No results for input(s): "BNP" in the last 8760 hours. Basic Metabolic Panel: Recent Labs  Lab 12/03/22 1415 12/04/22 0611 12/05/22 0615 12/06/22 0507 12/07/22 0520  NA 131* 135 143 141 142  K 5.3* 3.7 3.8  3.6 3.5  CL 93* 105 114* 107 110  CO2 25 21* 22 23 25   GLUCOSE 116* 118* 149* 139* 126*  BUN 61* 42* 36* 25* 28*  CREATININE 0.87 0.85 0.74 0.72 0.49  CALCIUM 8.9 7.7* 8.4* 8.2* 8.7*   Liver Function Tests: Recent Labs  Lab 12/03/22 1415 12/04/22 0611  AST 23 18  ALT 14 13  ALKPHOS 115 64  BILITOT 0.5 0.2*  PROT 7.7 4.5*  ALBUMIN 2.3* 1.7*   No results for input(s): "LIPASE", "AMYLASE" in the last 168 hours. No results for input(s): "AMMONIA" in the last 168 hours. CBC: Recent Labs  Lab 12/03/22 1415 12/04/22 0940 12/04/22 1817 12/05/22 0615 12/06/22 0507 12/07/22 0520  WBC 10.6* 5.4  --  5.3 5.9 5.0  NEUTROABS 8.4*  --   --   --   --   --   HGB 9.8* 6.8* 9.3* 9.0* 8.7* 8.7*  HCT 33.2* 22.7* 30.1* 28.8* 27.9* 28.5*  MCV 94.9 95.0  --  90.3 92.4 94.1  PLT 621* 426*  --  335 329 363   Cardiac Enzymes: No results for input(s): "CKTOTAL", "CKMB", "CKMBINDEX", "TROPONINI" in the last 168 hours. BNP: Invalid input(s): "POCBNP" CBG: Recent Labs  Lab 12/03/22 1309  GLUCAP 132*   D-Dimer No results for input(s): "DDIMER" in the last 72 hours. Hgb A1c No results for input(s): "HGBA1C" in the last 72 hours. Lipid Profile No results for  input(s): "CHOL", "HDL", "LDLCALC", "TRIG", "CHOLHDL", "LDLDIRECT" in the last 72 hours. Thyroid function studies No results for input(s): "TSH", "T4TOTAL", "T3FREE", "THYROIDAB" in the last 72 hours.  Invalid input(s): "FREET3" Anemia work up Recent Labs    12/04/22 1310  VITAMINB12 1,969*  FOLATE 7.0  FERRITIN 121  TIBC 169*  IRON 24*  RETICCTPCT 1.6   Urinalysis    Component Value Date/Time   COLORURINE YELLOW 12/04/2022 0537   APPEARANCEUR HAZY (A) 12/04/2022 0537   LABSPEC 1.009 12/04/2022 0537   PHURINE 8.0 12/04/2022 0537   GLUCOSEU NEGATIVE 12/04/2022 0537   HGBUR LARGE (A) 12/04/2022 0537   BILIRUBINUR NEGATIVE 12/04/2022 0537   KETONESUR NEGATIVE 12/04/2022 0537   PROTEINUR 100 (A) 12/04/2022 0537    UROBILINOGEN 0.2 06/25/2008 1551   NITRITE NEGATIVE 12/04/2022 0537   LEUKOCYTESUR LARGE (A) 12/04/2022 0537   Sepsis Labs Recent Labs  Lab 12/04/22 0940 12/05/22 0615 12/06/22 0507 12/07/22 0520  WBC 5.4 5.3 5.9 5.0   Microbiology Recent Results (from the past 240 hour(s))  Resp panel by RT-PCR (RSV, Flu A&B, Covid) Anterior Nasal Swab     Status: None   Collection Time: 12/03/22  1:14 PM   Specimen: Anterior Nasal Swab  Result Value Ref Range Status   SARS Coronavirus 2 by RT PCR NEGATIVE NEGATIVE Final    Comment: (NOTE) SARS-CoV-2 target nucleic acids are NOT DETECTED.  The SARS-CoV-2 RNA is generally detectable in upper respiratory specimens during the acute phase of infection. The lowest concentration of SARS-CoV-2 viral copies this assay can detect is 138 copies/mL. A negative result does not preclude SARS-Cov-2 infection and should not be used as the sole basis for treatment or other patient management decisions. A negative result may occur with  improper specimen collection/handling, submission of specimen other than nasopharyngeal swab, presence of viral mutation(s) within the areas targeted by this assay, and inadequate number of viral copies(<138 copies/mL). A negative result must be combined with clinical observations, patient history, and epidemiological information. The expected result is Negative.  Fact Sheet for Patients:  BloggerCourse.com  Fact Sheet for Healthcare Providers:  SeriousBroker.it  This test is no t yet approved or cleared by the [place] FDA and  has been authorized for detection and/or diagnosis of SARS-CoV-2 by FDA under an Emergency Use Authorization (EUA). This EUA will remain  in effect (meaning this test can be used) for the duration of the COVID-19 declaration under Section 564(b)(1) of the Act, 21 U.S.C.section 360bbb-3(b)(1), unless the authorization is terminated  or  revoked sooner.       Influenza A by PCR NEGATIVE NEGATIVE Final   Influenza B by PCR NEGATIVE NEGATIVE Final    Comment: (NOTE) The Xpert Xpress SARS-CoV-2/FLU/RSV plus assay is intended as an aid in the diagnosis of influenza from Nasopharyngeal swab specimens and should not be used as a sole basis for treatment. Nasal washings and aspirates are unacceptable for Xpert Xpress SARS-CoV-2/FLU/RSV testing.  Fact Sheet for Patients: BloggerCourse.com  Fact Sheet for Healthcare Providers: SeriousBroker.it  This test is not yet approved or cleared by the [place] FDA and has been authorized for detection and/or diagnosis of SARS-CoV-2 by FDA under an Emergency Use Authorization (EUA). This EUA will remain in effect (meaning this test can be used) for the duration of the COVID-19 declaration under Section 564(b)(1) of the Act, 21 U.S.C. section 360bbb-3(b)(1), unless the authorization is terminated or revoked.     Resp Syncytial Virus by PCR NEGATIVE NEGATIVE Final  Comment: (NOTE) Fact Sheet for Patients: BloggerCourse.com  Fact Sheet for Healthcare Providers: SeriousBroker.it  This test is not yet approved or cleared by the [place] FDA and has been authorized for detection and/or diagnosis of SARS-CoV-2 by FDA under an Emergency Use Authorization (EUA). This EUA will remain in effect (meaning this test can be used) for the duration of the COVID-19 declaration under Section 564(b)(1) of the Act, 21 U.S.C. section 360bbb-3(b)(1), unless the authorization is terminated or revoked.  Performed at [facility], [address], [place], [place] [address]   Blood Culture (routine x 2)     Status: None (Preliminary result)   Collection Time: 12/03/22  2:14 PM   Specimen: Right Antecubital; Blood  Result Value Ref Range Status   Specimen Description    Final    RIGHT ANTECUBITAL Performed at [facility], [address], [place], [place] [address]    Special Requests   Final    BOTTLES DRAWN AEROBIC AND ANAEROBIC Blood Culture adequate volume Performed at [facility], [address], [place], [place] [address]    Culture   Final    NO GROWTH 3 DAYS Performed at [facility], [address] [place], [place] [address]    Report Status PENDING  Incomplete  Blood Culture (routine x 2)     Status: None (Preliminary result)   Collection Time: 12/03/22  2:15 PM   Specimen: BLOOD  Result Value Ref Range Status   Specimen Description   Final    BLOOD SITE NOT SPECIFIED Performed at [facility], [address], [place], [place] [address]    Special Requests   Final    BOTTLES DRAWN AEROBIC ONLY Blood Culture adequate volume Performed at [facility], [address] [place], [place] [address]    Culture   Final    NO GROWTH 3 DAYS Performed at [facility], [address], [place], [place] [address]    Report Status PENDING  Incomplete  Urine Culture     Status: Abnormal   Collection Time: 12/04/22  5:39 AM   Specimen: Urine, Catheterized  Result Value Ref Range Status   Specimen Description   Final    URINE, CATHETERIZED Performed at [facility], [address], [place], [place] [address]    Special Requests   Final    NONE Performed at [facility], [address] [place], [place] [address]    Culture MULTIPLE SPECIES PRESENT, SUGGEST RECOLLECTION (A)  Final   Report Status 12/07/2022 FINAL  Final  C Difficile Quick Screen w PCR reflex     Status: None   Collection Time: 12/04/22  4:23 PM   Specimen: Stool  Result Value Ref Range Status   C Diff antigen NEGATIVE NEGATIVE Final   C Diff toxin NEGATIVE NEGATIVE Final   C Diff interpretation No C. difficile detected.  Final    Comment: Performed  at [facility], [address] [place], [place] [address]    FURTHER DISCHARGE INSTRUCTIONS:   Get Medicines reviewed and adjusted: Please take all your medications with you for your next visit with your Primary MD   Laboratory/radiological data: Please request your Primary MD to go over all hospital tests and procedure/radiological results at the follow up, please ask your Primary MD to get all Hospital records sent to his/her office.   In some cases, they will be blood work,  cultures and biopsy results pending at the time of your discharge. Please request that your primary care M.D. goes through all the records of your hospital data and follows up on these results.   Also Note the following: If you experience worsening of your admission symptoms, develop shortness of breath, life threatening emergency, suicidal or homicidal thoughts you must seek medical attention immediately by calling 911 or calling your MD immediately  if symptoms less severe.   You must read complete instructions/literature along with all the possible adverse reactions/side effects for all the Medicines you take and that have been prescribed to you. Take any new Medicines after you have completely understood and accpet all the possible adverse reactions/side effects.    Do not drive when taking Pain medications or sleeping medications (Benzodaizepines)   Do not take more than prescribed Pain, Sleep and Anxiety Medications. It is not advisable to combine anxiety,sleep and pain medications without talking with your primary care practitioner   Special Instructions: If you have smoked or chewed Tobacco  in the last 2 yrs please stop smoking, stop any regular Alcohol  and or any Recreational drug use.   Wear Seat belts while driving.   Please note: You were cared for by a hospitalist during your hospital stay. Once you are discharged, your primary care physician will handle any further medical issues.  Please note that NO REFILLS for any discharge medications will be authorized once you are discharged, as it is imperative that you return to your primary care physician (or establish a relationship with a primary care physician if you do not have one) for your post hospital discharge needs so that they can reassess your need for medications and monitor your lab values  Time coordinating discharge: Over 30 minutes  SIGNED:   Hughie Closs, MD  [facility] 12/07/2022, 10:17 AM *Please note that this is a verbal dictation therefore any spelling or grammatical errors are due to the "[employer] One" system interpretation. If 7PM-7AM, please contact night-coverage [email or web address]

## 2022-12-07 NOTE — Plan of Care (Signed)

## 2022-12-07 NOTE — TOC Transition Note (Signed)
Transition of Care [facility]) - CM/SW Discharge Note   Patient Details  Name: Deanna Baldwin MRN: [id number] Date of Birth: [birth year]  Transition of Care [facility]) CM/SW Contact:  Otelia Santee, LCSW Phone Number: 12/07/2022, 11:21 AM   Clinical Narrative:    Pt to return to [facility] where she is a LTC resident. RN to call report to [phone]. DC packet with signed DNR placed at RN station. Pt will be transported to facility via [facility]. [facility] called at 3:58pm for transportation.    Final next level of care: Long Term Nursing Home Barriers to Discharge: No Barriers Identified   Patient Goals and CMS Choice CMS Medicare.gov Compare Post Acute Care list provided to::  (NA) Choice offered to / list presented to :  (NA)  Discharge Placement                Patient chooses bed at: Other - please specify in the comment section below: [facility]) Patient to be transferred to facility by: [facility] Name of family member notified: Patient Patient and family notified of of transfer: 12/07/22  Discharge Plan and Services Additional resources added to the After Visit Summary for   In-house Referral: Clinical Social Work Discharge Planning Services: NA Post Acute Care Choice: Resumption of Svcs/PTA Provider          DME Arranged: N/A DME Agency: NA                  Social Determinants of Health (SDOH) Interventions SDOH Screenings   Food Insecurity: No Food Insecurity (08/18/2017)  Transportation Needs: No Transportation Needs (08/18/2017)  Financial Resource Strain: Low Risk  (08/18/2017)  Physical Activity: Inactive (08/18/2017)  Social Connections: Moderately Isolated (08/18/2017)  Stress: No Stress Concern Present (08/18/2017)  Tobacco Use: Medium Risk (11/22/2022)     Readmission Risk Interventions    12/06/2022   12:29 PM  Readmission Risk Prevention Plan  Transportation Screening Complete  PCP or Specialist Appt within 5-7 Days Complete  Home Care Screening  Complete  Medication Review (RN CM) Complete

## 2022-12-08 LAB — CULTURE, BLOOD (ROUTINE X 2)
Culture: NO GROWTH
Culture: NO GROWTH
Special Requests: ADEQUATE
Special Requests: ADEQUATE
